# Patient Record
Sex: Female | Born: 1935 | Race: White | Hispanic: No | Smoking: Former smoker
Health system: Southern US, Community
[De-identification: ages and names within clinical notes are randomized; demographics above are authoritative.]

## PROBLEM LIST (undated history)

## (undated) DIAGNOSIS — C50419 Malignant neoplasm of upper-outer quadrant of unspecified female breast: Secondary | ICD-10-CM

## (undated) DIAGNOSIS — M1990 Unspecified osteoarthritis, unspecified site: Secondary | ICD-10-CM

## (undated) DIAGNOSIS — J449 Chronic obstructive pulmonary disease, unspecified: Secondary | ICD-10-CM

## (undated) DIAGNOSIS — R32 Unspecified urinary incontinence: Secondary | ICD-10-CM

## (undated) DIAGNOSIS — R001 Bradycardia, unspecified: Secondary | ICD-10-CM

## (undated) DIAGNOSIS — I482 Chronic atrial fibrillation, unspecified: Secondary | ICD-10-CM

## (undated) DIAGNOSIS — E785 Hyperlipidemia, unspecified: Secondary | ICD-10-CM

## (undated) DIAGNOSIS — M810 Age-related osteoporosis without current pathological fracture: Secondary | ICD-10-CM

## (undated) DIAGNOSIS — G4730 Sleep apnea, unspecified: Secondary | ICD-10-CM

## (undated) DIAGNOSIS — Z923 Personal history of irradiation: Secondary | ICD-10-CM

## (undated) DIAGNOSIS — T7840XA Allergy, unspecified, initial encounter: Secondary | ICD-10-CM

## (undated) DIAGNOSIS — I2510 Atherosclerotic heart disease of native coronary artery without angina pectoris: Secondary | ICD-10-CM

## (undated) DIAGNOSIS — I10 Essential (primary) hypertension: Secondary | ICD-10-CM

## (undated) DIAGNOSIS — E039 Hypothyroidism, unspecified: Secondary | ICD-10-CM

## (undated) DIAGNOSIS — C50919 Malignant neoplasm of unspecified site of unspecified female breast: Secondary | ICD-10-CM

## (undated) DIAGNOSIS — G629 Polyneuropathy, unspecified: Secondary | ICD-10-CM

## (undated) DIAGNOSIS — M722 Plantar fascial fibromatosis: Secondary | ICD-10-CM

## (undated) DIAGNOSIS — F419 Anxiety disorder, unspecified: Secondary | ICD-10-CM

## (undated) HISTORY — DX: Anxiety disorder, unspecified: F41.9

## (undated) HISTORY — DX: Allergy, unspecified, initial encounter: T78.40XA

## (undated) HISTORY — DX: Age-related osteoporosis without current pathological fracture: M81.0

## (undated) HISTORY — DX: Unspecified urinary incontinence: R32

## (undated) HISTORY — DX: Hypothyroidism, unspecified: E03.9

## (undated) HISTORY — PX: SKIN BIOPSY: SHX1

## (undated) HISTORY — DX: Hyperlipidemia, unspecified: E78.5

## (undated) HISTORY — DX: Unspecified osteoarthritis, unspecified site: M19.90

## (undated) HISTORY — DX: Bradycardia, unspecified: R00.1

## (undated) HISTORY — DX: Chronic atrial fibrillation, unspecified: I48.20

## (undated) HISTORY — DX: Malignant neoplasm of upper-outer quadrant of unspecified female breast: C50.419

## (undated) HISTORY — DX: Malignant neoplasm of unspecified site of unspecified female breast: C50.919

## (undated) HISTORY — DX: Atherosclerotic heart disease of native coronary artery without angina pectoris: I25.10

## (undated) HISTORY — DX: Essential (primary) hypertension: I10

## (undated) HISTORY — DX: Plantar fascial fibromatosis: M72.2

## (undated) HISTORY — DX: Polyneuropathy, unspecified: G62.9

## (undated) HISTORY — DX: Personal history of irradiation: Z92.3

---
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
## 1974-05-06 HISTORY — PX: DILATION AND CURETTAGE OF UTERUS: SHX78

## 1990-05-06 HISTORY — PX: HAND SURGERY: SHX662

## 1998-05-06 HISTORY — PX: CORONARY ANGIOPLASTY WITH STENT PLACEMENT: SHX49

## 1998-10-12 ENCOUNTER — Ambulatory Visit (HOSPITAL_COMMUNITY): Admission: RE | Admit: 1998-10-12 | Discharge: 1998-10-13 | Payer: Self-pay | Admitting: Cardiology

## 1998-10-19 ENCOUNTER — Inpatient Hospital Stay (HOSPITAL_COMMUNITY): Admission: AD | Admit: 1998-10-19 | Discharge: 1998-10-20 | Payer: Self-pay | Admitting: Cardiology

## 1998-10-19 ENCOUNTER — Encounter: Payer: Self-pay | Admitting: Cardiology

## 1999-05-21 ENCOUNTER — Ambulatory Visit (HOSPITAL_COMMUNITY): Admission: RE | Admit: 1999-05-21 | Discharge: 1999-05-21 | Payer: Self-pay | Admitting: Cardiology

## 2004-03-27 ENCOUNTER — Other Ambulatory Visit: Admission: RE | Admit: 2004-03-27 | Discharge: 2004-03-27 | Payer: Self-pay | Admitting: Obstetrics and Gynecology

## 2004-07-02 ENCOUNTER — Encounter: Admission: RE | Admit: 2004-07-02 | Discharge: 2004-07-02 | Payer: Self-pay | Admitting: Internal Medicine

## 2005-04-29 ENCOUNTER — Emergency Department (HOSPITAL_COMMUNITY): Admission: EM | Admit: 2005-04-29 | Discharge: 2005-04-29 | Payer: Self-pay | Admitting: Emergency Medicine

## 2006-06-26 ENCOUNTER — Encounter: Admission: RE | Admit: 2006-06-26 | Discharge: 2006-06-26 | Payer: Self-pay | Admitting: Internal Medicine

## 2006-12-30 ENCOUNTER — Emergency Department (HOSPITAL_COMMUNITY): Admission: EM | Admit: 2006-12-30 | Discharge: 2006-12-30 | Payer: Self-pay | Admitting: Emergency Medicine

## 2007-05-01 ENCOUNTER — Encounter: Admission: RE | Admit: 2007-05-01 | Discharge: 2007-05-01 | Payer: Self-pay | Admitting: Internal Medicine

## 2010-09-21 NOTE — Cardiovascular Report (Signed)
[place]. [facility]  Patient:    [name]                           MRN: [id number] Proc. Date: 05/21/99 Adm. Date:  [id number] Attending:  Corliss Marcus CC:         Winn Jock. Earl Gala, M.D.             Cardiac Catheterization Laboratory                        Cardiac Catheterization  CINE NUMBER:  [id number]  REFERRING PHYSICIAN:  Dr. Fayrene Fearing C. Osborne.  PROCEDURE PERFORMED: 1. Left heart catheterization. 2. Coronary angiography. 3. Left ventriculogram.  INDICATIONS:  [name] is a 75 year old woman, who is now seven months status ost PTCA and stent implantation of the right coronary artery.  She had been doing reasonably well recently, but a screening exercise Cardiolite obtained to rule ut the possibly of silent restenosis indicated a mild inferior reversible defect. She is therefore brought to the catheterization laboratory to evaluate for possible  asymptomatic restenosis.  DESCRIPTION OF PROCEDURE:  Left heart catheterization was performed following the percutaneous insertion of a 5 French catheter sheath utilizing an anterior approach over a guiding J wire into the right femoral artery.  The right groin had previously been prepared and draped in the usual sterile fashion.  Local anesthesia was obtained with the infiltration of 0.1% lidocaine.  A 5 French 110 cm pigtail catheter was advanced to the ascending aorta and then to the left ventricle where the pressure was recorded both prior to and following the ventriculogram.  A 30  degree RAO cine left ventriculogram was performed using a power injector.  Then  45 cc of nonionic contrast material was injected at 13 cc/sec.  Coronary angiography was then performed using 5 French #4 left and right Judkins catheters. Cineangiography of each coronary artery was conducted in multiple LAO and RAO projections.  All catheter manipulations were performed using fluoroscopic observation and  exchanged over a long guiding J wire.  The patient did receive 500 units of heparin intravenously following the insertion of the arterial sheath and 0.4 mg of sublingual nitroglycerin prior to initiation of the coronary angiography.  At the completion of the procedure, the catheter and catheter sheath was removed. Hemostasis was achieved by direct pressure.  The patient was transported back to the day catheterization center in stable condition with intact distal pulses. FLUOROSCOPY TIME:  2.8 minutes.  TOTAL CONTRAST UTILIZED:  Isovue 150 cc.  HEMODYNAMICS:  Systemic arterial pressure was mildly elevated at 176/92 with a ean of 125 mmHg.  There was no systolic gradient across the aortic valve.  The left  ventricular end-diastolic pressure was 30 mmHg preventriculogram and 33 mmHg post ventriculogram.  ANGIOGRAPHY:  LEFT VENTRICULOGRAM:  The left ventriculogram demonstrated normal left ventricular chamber size and overall normal global systolic function.  There was mild inferior wall hypokinesia intact left ventricular size and global systolic function. There was a anterolateral hypokinesis.  The calculated ejection fraction utilizing a single plane cine method was 61%.  There was diastolic mitral regurgitation seen, none during systole.  There was no coronary calcification.  CORONARY ANGIOGRAPHY:  There was a right dominant coronary system present.  The main left coronary artery was normal.  The left anterior descending:  The left anterior descending artery and its branches were minimal diseased.  There were  luminal irregularities throughout the course of the left anterior descending which was large and transapical.  The large diagonal branch showed a 50% stenosis in the midportion.  The body of the left anterior descending itself had no significant stenosis but was diffusely diseased minimally.  Left circumflex:  The left circumflex artery and its branches were  minimally diseased; again, there were luminal irregularities throughout the course of this vessel not greater than 20-25%.  Four moderate sized marginal branches arise feeding the lateral wall of the heart and were without significant stenosis.  Right coronary artery:  The right coronary artery and its branches was minimally diseased.  The proximal portion is the location of the previously placed stent.  There was about a 20-25% stenosis through the stent segment.  It was otherwise widely patent.  The ongoing vessel was large and again had luminal irregularities, perhaps a 30% obstruction of the lumen.  There was a moderate sized posterior descending artery and two large posterior descending branches which were seen distally and without significant obstruction.  Collateral vessels were not seen.  FINAL DIAGNOSES: 1. Atherosclerotic cardiovascular disease, single-vessel. 2. Widely patent right coronary artery stent site.  No evidence of restenosis. 3. Moderately elevated left ventricular end-diastolic pressure. 4. Moderate systemic hypertension. 5. Overall normal global systolic function.  PLAN:  The patient is informed of this gratifying news.  No further coronary intervention is anticipated at this time.  The patient will be discharged to home on the usual outpatient medicines with followup for hypertensive control. DD: 05/21/99 TD:  05/21/99 Job: [id number]

## 2010-12-25 ENCOUNTER — Other Ambulatory Visit: Payer: Self-pay

## 2010-12-26 ENCOUNTER — Other Ambulatory Visit: Payer: Self-pay | Admitting: Radiology

## 2010-12-26 DIAGNOSIS — C50912 Malignant neoplasm of unspecified site of left female breast: Secondary | ICD-10-CM

## 2010-12-26 DIAGNOSIS — C50419 Malignant neoplasm of upper-outer quadrant of unspecified female breast: Secondary | ICD-10-CM

## 2010-12-26 DIAGNOSIS — Z170 Estrogen receptor positive status [ER+]: Secondary | ICD-10-CM

## 2010-12-26 DIAGNOSIS — C50412 Malignant neoplasm of upper-outer quadrant of left female breast: Secondary | ICD-10-CM | POA: Insufficient documentation

## 2010-12-26 DIAGNOSIS — C50919 Malignant neoplasm of unspecified site of unspecified female breast: Secondary | ICD-10-CM

## 2010-12-26 HISTORY — DX: Malignant neoplasm of upper-outer quadrant of unspecified female breast: C50.419

## 2010-12-26 HISTORY — DX: Malignant neoplasm of unspecified site of unspecified female breast: C50.919

## 2010-12-28 ENCOUNTER — Ambulatory Visit
Admission: RE | Admit: 2010-12-28 | Discharge: 2010-12-28 | Disposition: A | Discharge status: Home / Self Care | Payer: Medicare Other | Source: Ambulatory Visit | Attending: Radiology | Admitting: Radiology

## 2010-12-28 DIAGNOSIS — C50912 Malignant neoplasm of unspecified site of left female breast: Secondary | ICD-10-CM

## 2010-12-28 MED ORDER — GADOBENATE DIMEGLUMINE 529 MG/ML IV SOLN
20.0000 mL | Freq: Once | INTRAVENOUS | Status: AC | PRN
  Administered 2010-12-28: 20 mL via INTRAVENOUS

## 2011-01-01 ENCOUNTER — Ambulatory Visit (HOSPITAL_COMMUNITY)
Admission: RE | Admit: 2011-01-01 | Discharge: 2011-01-01 | Disposition: A | Discharge status: Home / Self Care | Payer: Medicare Other | Source: Ambulatory Visit | Attending: Internal Medicine | Admitting: Internal Medicine

## 2011-01-01 DIAGNOSIS — J45909 Unspecified asthma, uncomplicated: Secondary | ICD-10-CM | POA: Insufficient documentation

## 2011-01-02 ENCOUNTER — Encounter (INDEPENDENT_AMBULATORY_CARE_PROVIDER_SITE_OTHER): Payer: Self-pay | Admitting: Surgery

## 2011-01-02 ENCOUNTER — Ambulatory Visit (HOSPITAL_BASED_OUTPATIENT_CLINIC_OR_DEPARTMENT_OTHER): Payer: Medicare Other | Admitting: Surgery

## 2011-01-02 ENCOUNTER — Other Ambulatory Visit: Payer: Self-pay | Admitting: Oncology

## 2011-01-02 ENCOUNTER — Telehealth (INDEPENDENT_AMBULATORY_CARE_PROVIDER_SITE_OTHER): Payer: Self-pay | Admitting: General Surgery

## 2011-01-02 ENCOUNTER — Ambulatory Visit (INDEPENDENT_AMBULATORY_CARE_PROVIDER_SITE_OTHER): Payer: Medicare Other | Admitting: Surgery

## 2011-01-02 ENCOUNTER — Encounter (HOSPITAL_BASED_OUTPATIENT_CLINIC_OR_DEPARTMENT_OTHER): Payer: Medicare Other | Admitting: Oncology

## 2011-01-02 ENCOUNTER — Ambulatory Visit (INDEPENDENT_AMBULATORY_CARE_PROVIDER_SITE_OTHER): Payer: Self-pay | Admitting: Surgery

## 2011-01-02 VITALS — BP 105/70 | HR 79 | Temp 97.9°F | Resp 20 | Ht 66.0 in | Wt 345.0 lb

## 2011-01-02 DIAGNOSIS — C50919 Malignant neoplasm of unspecified site of unspecified female breast: Secondary | ICD-10-CM

## 2011-01-02 DIAGNOSIS — Z01810 Encounter for preprocedural cardiovascular examination: Secondary | ICD-10-CM

## 2011-01-02 DIAGNOSIS — C50419 Malignant neoplasm of upper-outer quadrant of unspecified female breast: Secondary | ICD-10-CM

## 2011-01-02 LAB — COMPREHENSIVE METABOLIC PANEL
AST: 23 U/L (ref 0–37)
Albumin: 3.4 g/dL — ABNORMAL LOW (ref 3.5–5.2)
BUN: 21 mg/dL (ref 6–23)
Calcium: 9.9 mg/dL (ref 8.4–10.5)
Chloride: 101 mEq/L (ref 96–112)
Creatinine, Ser: 0.96 mg/dL (ref 0.50–1.10)
Glucose, Bld: 173 mg/dL — ABNORMAL HIGH (ref 70–99)
Potassium: 3 mEq/L — ABNORMAL LOW (ref 3.5–5.3)

## 2011-01-02 LAB — CBC WITH DIFFERENTIAL/PLATELET
Basophils Absolute: 0 10*3/uL (ref 0.0–0.1)
EOS%: 1.2 % (ref 0.0–7.0)
Eosinophils Absolute: 0.1 10*3/uL (ref 0.0–0.5)
HCT: 44.3 % (ref 34.8–46.6)
HGB: 15.2 g/dL (ref 11.6–15.9)
MCH: 32.5 pg (ref 25.1–34.0)
MCV: 94.7 fL (ref 79.5–101.0)
MONO%: 9.2 % (ref 0.0–14.0)
NEUT#: 4.7 10*3/uL (ref 1.5–6.5)
NEUT%: 64.1 % (ref 38.4–76.8)
lymph#: 1.9 10*3/uL (ref 0.9–3.3)

## 2011-01-02 NOTE — Telephone Encounter (Signed)
Patient needs cardiology referral prior to surgery. Patient is seen at [facility].

## 2011-01-02 NOTE — Progress Notes (Signed)
Chief Complaint  Patient presents with  . Breast Cancer    HPI [name] is a 75 y.o. female.  This patient is seen in the breast multidisciplinary conference today for a recent diagnosis of an invasive lobular carcinoma in the upper outer quadrant of the left breast. This was diagnosed on mammogram and a core biopsy confirmed invasive lobular carcinoma with some associated DCIS and some lobular hyperplasia. Patient has been completely asymptomatic. She does know she got a bruise from the biopsy. She's never had any other prior significant breast problems. HPI  Past Medical History  Diagnosis Date  . Anxiety   . Allergy   . Asthma   . 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 Incontinence of urine   . Plantar fasciitis     Past Surgical History  Procedure Date  . Tonsillectomy 1955  . Cholecystectomy 1955  . Hand surgery 1992    tendons between thumb and forefinger  . Coronary angioplasty with stent placement 2000  . Skin biopsy     1994, 2006, 2008, 2010, 2011, 2011, 2012-  pre-cancerous  . Dilation and curettage of uterus 1976    and 1996  . Breast surgery     Family History  Problem Relation Age of Onset  . Heart disease Father   . Asthma Sister   . Heart disease Brother     Social History History  Substance Use Topics  . Smoking status: Former Smoker -- 1.0 packs/day    Quit date: 12/05/1962  . Smokeless tobacco: Not on file  . Alcohol Use: No    Allergies  Allergen Reactions  . Lidocaine     Became shakey  . Lipitor (Atorvastatin Calcium)     Elevated liver function   . Sulfa Antibiotics Nausea Only    Also complained of dizziness  . Vicodin (Hydrocodone-Acetaminophen)     Itching all over  . Penicillins Rash    Pt reported all over body rash in 1958    Current Outpatient Prescriptions  Medication Sig Dispense Refill  . albuterol (PROVENTIL HFA;VENTOLIN HFA) 108 (90 BASE) MCG/ACT inhaler Inhale 2 puffs into the lungs as needed.        Marland Kitchen  alendronate (FOSAMAX) 70 MG tablet Take 70 mg by mouth every 7 (seven) days. Take with a full glass of water on an empty stomach.       . ALPRAZolam (XANAX) 0.5 MG tablet Take 0.5 mg by mouth 2 (two) times daily as needed.        Marland Kitchen aspirin 325 MG tablet Take 325 mg by mouth daily.        Marland Kitchen atenolol (TENORMIN) 50 MG tablet Take 50 mg by mouth daily.        . fexofenadine (ALLEGRA) 180 MG tablet Take 180 mg by mouth daily.        . fish oil-omega-3 fatty acids 1000 MG capsule Take 2 g by mouth 4 (four) times daily.        Marland Kitchen FLUoxetine (PROZAC) 20 MG capsule Take 20 mg by mouth daily.        . fluticasone (VERAMYST) 27.5 MCG/SPRAY nasal spray Place 2 sprays into the nose daily.        . Fluticasone-Salmeterol (ADVAIR) 500-50 MCG/DOSE AEPB Inhale 1 puff into the lungs every 12 (twelve) hours.        . hydrochlorothiazide 25 MG tablet Take 50 mg by mouth once.        Marland Kitchen  levothyroxine (SYNTHROID, LEVOTHROID) 50 MCG tablet Take 50 mcg by mouth daily.        . montelukast (SINGULAIR) 10 MG tablet Take 10 mg by mouth at bedtime.        . Multiple Vitamin (MULTIVITAMIN) tablet Take 1 tablet by mouth daily.        . potassium chloride (KLOR-CON) 20 MEQ packet Take 20 mEq by mouth 1 day or 1 dose.        . traMADol (ULTRAM) 50 MG tablet Take 50 mg by mouth every 6 (six) hours as needed.          Review of Systems ROSI have reviewed her past medical history and review of systems most of which are noted in the [facility] chart. Therefore they are not redictated in this note.  Blood pressure 105/70, pulse 79, temperature 97.9 F (36.6 C), resp. rate 20, height 5\' 6"  (1.676 m), weight 345 lb (156.491 kg).  Physical Exam GENERAL: The patient is alert, oriented, and generally healthy-appearing, NAD. She is very obese. She has need of a walk Mood and affect are normal.  HEENT: The head is normocephalic, the eyes nonicteric, the pupils were round regular and equal. EOMs are normal. Pharynx normal. Dentition  good.  NECK: The neck is supple and there are no masses or thyromegaly.  LUNGS: Normal respirations and clear to auscultation.  HEART: Regular rhythm, with no murmurs rubs or gallops. Pulses are intact carotid dorsalis pedis and posterior tibial. No significant varicosities are noted.  BREASTS: the breasts are symmetric in size and shape. They are fairly large. There is no definite palpable mass on either side. There is a little bit of tenderness around the biopsy site.  LYMPHATICS: There is no axillary or supraclavicular adenopathy on either side.  ABDOMEN: Soft, flat, and nontender. No masses or organomegaly is noted. No hernias are noted. Bowel sounds are normal.  EXTREMITIES: Good range of motion, there is some brawny edema of both lower remedies  Physical Exam   Data Reviewed I have reviewed her mammogram films, the reports, the MRI reports and the significant findings on the scans. I've seen the pathology report and slides. I reviewed her detail past medical history by her primary physicians.  Assessment    Clinical stage I receptor positive invasive lobular carcinoma upper outer quadrant left breast    Plan    I think she is a good candidate for a needle guided lumpectomy and sentinel node evaluation. She has multiple medical comorbidities. She'll need a cardiac clearance. She will likely need radiation therapy after surgery. If her sentinel nodes are involved she may need additional radiation, but after discussion with medical and radiation oncologists, full no dissection would not be indicated here.  I discussed the above findings with the patient and her family in detail. We have reviewed the indications risks and complications of the surgery, the need for long-term followup, and the need for radiation and probably antiestrogen therapy after surgery. She is aware she has increased risk because of her medical issues. She does we are trying to arrange a cardiac clearance  preoperatively.  Thank all questions have been answered and she would like to get scheduled as soon as possible. We will try to arrange that as soon as her clearance is done.       Eugean Arnott J 01/02/2011, 11:25 AM

## 2011-01-10 ENCOUNTER — Telehealth (INDEPENDENT_AMBULATORY_CARE_PROVIDER_SITE_OTHER): Payer: Self-pay | Admitting: General Surgery

## 2011-01-10 NOTE — Telephone Encounter (Signed)
Patient needs an RX for a camisole faxed to [facility]. Fax # [phone].

## 2011-01-11 ENCOUNTER — Telehealth (INDEPENDENT_AMBULATORY_CARE_PROVIDER_SITE_OTHER): Payer: Self-pay | Admitting: General Surgery

## 2011-01-11 NOTE — Telephone Encounter (Signed)
I spoke with patient making her aware a prescription for a camisole was faxed this am to [facility]. She expressed appreciation.

## 2011-01-17 ENCOUNTER — Telehealth (INDEPENDENT_AMBULATORY_CARE_PROVIDER_SITE_OTHER): Payer: Self-pay | Admitting: Surgery

## 2011-01-17 NOTE — Telephone Encounter (Signed)
Patient states someone cancelled her appt by the name "Lusk" with [facility]. She spoke with the nurse over there who is speaking with the doctor about fitting her back in. I informed the patient that I did not cancel this appt and if she needs in sooner she could always ask to speak with a manager at [facility] and let them know what happened.

## 2011-01-21 DIAGNOSIS — I482 Chronic atrial fibrillation, unspecified: Secondary | ICD-10-CM | POA: Insufficient documentation

## 2011-01-21 DIAGNOSIS — I4891 Unspecified atrial fibrillation: Secondary | ICD-10-CM | POA: Insufficient documentation

## 2011-01-25 ENCOUNTER — Telehealth (INDEPENDENT_AMBULATORY_CARE_PROVIDER_SITE_OTHER): Payer: Self-pay | Admitting: Surgery

## 2011-01-25 ENCOUNTER — Other Ambulatory Visit (INDEPENDENT_AMBULATORY_CARE_PROVIDER_SITE_OTHER): Payer: Self-pay | Admitting: Surgery

## 2011-01-25 DIAGNOSIS — C50912 Malignant neoplasm of unspecified site of left female breast: Secondary | ICD-10-CM

## 2011-01-25 NOTE — Telephone Encounter (Signed)
Spoke with patient re: her preop blood thinning medication. Patient takes Xarelto 20 mg QD per Dr Eldridge Dace. Patient was told by him to stop this 2 days prior to surgery. This medication thins your plasma not your platelets. I spoke with Dr Jamey Ripa and he advised to go with what Dr Eldridge Dace recommended since he was not familiar with this medicaiton. Patient made aware. She was also asking about her medications the day of surgery. I advised she should speak with the nurse from the hospital when they call about her preop appt. She will call with any other questions prior to surgery.

## 2011-01-25 NOTE — Telephone Encounter (Signed)
01/25/11 Please call pt [name] she is having surgery 9/25 she has some questions in regards to her new medication Dr Jamey Ripa gave here.  [name]

## 2011-01-28 ENCOUNTER — Other Ambulatory Visit (INDEPENDENT_AMBULATORY_CARE_PROVIDER_SITE_OTHER): Payer: Self-pay | Admitting: Surgery

## 2011-01-28 ENCOUNTER — Ambulatory Visit
Admission: RE | Admit: 2011-01-28 | Discharge: 2011-01-28 | Disposition: A | Discharge status: Home / Self Care | Payer: Medicare Other | Source: Ambulatory Visit | Attending: Surgery | Admitting: Surgery

## 2011-01-28 ENCOUNTER — Encounter (HOSPITAL_BASED_OUTPATIENT_CLINIC_OR_DEPARTMENT_OTHER)
Admission: RE | Admit: 2011-01-28 | Discharge: 2011-01-28 | Disposition: A | Discharge status: Home / Self Care | Payer: Medicare Other | Source: Ambulatory Visit | Attending: Surgery | Admitting: Surgery

## 2011-01-28 ENCOUNTER — Telehealth (INDEPENDENT_AMBULATORY_CARE_PROVIDER_SITE_OTHER): Payer: Self-pay | Admitting: General Surgery

## 2011-01-28 DIAGNOSIS — Z01811 Encounter for preprocedural respiratory examination: Secondary | ICD-10-CM

## 2011-01-28 LAB — DIFFERENTIAL
Basophils Absolute: 0 10*3/uL (ref 0.0–0.1)
Basophils Relative: 0 % (ref 0–1)
Lymphocytes Relative: 18 % (ref 12–46)
Monocytes Absolute: 0.9 10*3/uL (ref 0.1–1.0)
Neutro Abs: 6.1 10*3/uL (ref 1.7–7.7)
Neutrophils Relative %: 69 % (ref 43–77)

## 2011-01-28 LAB — CBC
HCT: 42.5 % (ref 36.0–46.0)
Hemoglobin: 14.4 g/dL (ref 12.0–15.0)
RBC: 4.43 MIL/uL (ref 3.87–5.11)
WBC: 8.9 10*3/uL (ref 4.0–10.5)

## 2011-01-28 LAB — BASIC METABOLIC PANEL
BUN: 15 mg/dL (ref 6–23)
CO2: 30 mEq/L (ref 19–32)
Chloride: 102 mEq/L (ref 96–112)
GFR calc non Af Amer: >60 mL/min (ref >60)
Glucose, Bld: 119 mg/dL — ABNORMAL HIGH (ref 70–99)
Potassium: 3.7 mEq/L (ref 3.5–5.1)
Sodium: 141 mEq/L (ref 135–145)

## 2011-01-28 NOTE — Telephone Encounter (Signed)
Labs okay for surgery faxed to pre-op.  

## 2011-01-28 NOTE — Telephone Encounter (Signed)
Message copied by Liliana Cline on Mon Jan 28, 2011  4:49 PM ------      Message from: [name]      Created: Mon Jan 28, 2011  3:48 PM       These labs are OK for surgery

## 2011-01-29 ENCOUNTER — Encounter (INDEPENDENT_AMBULATORY_CARE_PROVIDER_SITE_OTHER): Payer: Self-pay | Admitting: Surgery

## 2011-01-29 ENCOUNTER — Ambulatory Visit (HOSPITAL_BASED_OUTPATIENT_CLINIC_OR_DEPARTMENT_OTHER)
Admission: RE | Admit: 2011-01-29 | Discharge: 2011-01-29 | Disposition: A | Discharge status: Home / Self Care | Payer: Medicare Other | Source: Ambulatory Visit | Attending: Surgery | Admitting: Surgery

## 2011-01-29 ENCOUNTER — Ambulatory Visit (HOSPITAL_COMMUNITY)
Admission: RE | Admit: 2011-01-29 | Discharge: 2011-01-29 | Disposition: A | Discharge status: Home / Self Care | Payer: Medicare Other | Source: Ambulatory Visit | Attending: Surgery | Admitting: Surgery

## 2011-01-29 ENCOUNTER — Other Ambulatory Visit (INDEPENDENT_AMBULATORY_CARE_PROVIDER_SITE_OTHER): Payer: Self-pay | Admitting: Surgery

## 2011-01-29 DIAGNOSIS — I10 Essential (primary) hypertension: Secondary | ICD-10-CM | POA: Insufficient documentation

## 2011-01-29 DIAGNOSIS — Z01818 Encounter for other preprocedural examination: Secondary | ICD-10-CM | POA: Insufficient documentation

## 2011-01-29 DIAGNOSIS — Z01812 Encounter for preprocedural laboratory examination: Secondary | ICD-10-CM | POA: Insufficient documentation

## 2011-01-29 DIAGNOSIS — C50919 Malignant neoplasm of unspecified site of unspecified female breast: Secondary | ICD-10-CM | POA: Insufficient documentation

## 2011-01-29 DIAGNOSIS — I2510 Atherosclerotic heart disease of native coronary artery without angina pectoris: Secondary | ICD-10-CM | POA: Insufficient documentation

## 2011-01-29 DIAGNOSIS — C50419 Malignant neoplasm of upper-outer quadrant of unspecified female breast: Secondary | ICD-10-CM | POA: Insufficient documentation

## 2011-01-29 DIAGNOSIS — C50912 Malignant neoplasm of unspecified site of left female breast: Secondary | ICD-10-CM

## 2011-01-29 DIAGNOSIS — E669 Obesity, unspecified: Secondary | ICD-10-CM | POA: Insufficient documentation

## 2011-01-29 HISTORY — PX: BREAST LUMPECTOMY W/ NEEDLE LOCALIZATION: SHX1266

## 2011-01-29 MED ORDER — TECHNETIUM TC 99M SULFUR COLLOID FILTERED
1.0000 | Freq: Once | INTRAVENOUS | Status: AC | PRN
  Administered 2011-01-29: 1 via INTRADERMAL

## 2011-01-30 ENCOUNTER — Telehealth (INDEPENDENT_AMBULATORY_CARE_PROVIDER_SITE_OTHER): Payer: Self-pay | Admitting: General Surgery

## 2011-01-30 NOTE — Telephone Encounter (Signed)
Message copied by Liliana Cline on Wed Jan 30, 2011 11:11 AM ------      Message from: Milas Hock      Created: Wed Jan 30, 2011 11:01 AM       Pt needs 2 week post op for lumpectomy.  Pls schedule and call her at [phone].  Thanks.

## 2011-01-30 NOTE — Telephone Encounter (Signed)
Dr Jamey Ripa is DOW at the two week mark. I called patient and made appt for 02/20/11. She is to call if has any issues and we will make appt for her to see someone sooner. Patient is doing very well.

## 2011-01-31 NOTE — Op Note (Addendum)
[name], [name]                   ACCOUNT NO.:  [id number]  MEDICAL RECORD NO.:  [id number]  LOCATION:  [facility]                          FACILITY:  [facility]  PHYSICIAN:  [name], M.D.DATE OF BIRTH:  02-11-1936  DATE OF PROCEDURE:  01/29/2011 DATE OF DISCHARGE:                              OPERATIVE REPORT   PREOPERATIVE DIAGNOSIS:  Carcinoma, left breast, upper outer quadrant, clinical stage I.  POSTOPERATIVE DIAGNOSIS:  Carcinoma, left breast, upper outer quadrant, clinical stage I.  PROCEDURE:  Needle-guided lumpectomy with blue dye injection and sentinel lymph node biopsy (2 nodes)  SURGEON:  [name], MD  ANESTHESIA:  General.  CLINICAL HISTORY:  This is a 75 year old lady who recently found to have a small left breast cancer in the upper outer quadrant.  After discussion with the patient and consultation with Medical and Radiation Oncology, was elected to proceed to a lumpectomy with nodal evaluation.  DESCRIPTION OF PROCEDURE:  I saw the patient in the holding area and she had no further questions.  We confirmed the left breast as the operative side and I marked that.  I reviewed the guidewire localizing films.  The patient was taken to the operating room and after satisfactory general anesthesia had been obtained, the time-out was done.  I then injected 5 mL of dilute methylene blue and massaged that in subareolarly.  A full prep and drape was then done.  The guidewire entered laterally and tracked medially.  The breasts were fairly large, so I have retracted medially and then made a transverse incision directly over the guidewire site.  I raised some skin thin flap superiorly and inferiorly to the guidewire onto the breast tissue for some traction and then took a wide block of tissue around the guidewire until I was beyond the tip.  This tracked directly towards the chest wall.  I appeared to be well around it and inked the specimen.   Specimen mammogram showed the clip in the specimen.  I thought it was closest to the inferior and medial borders, so I took another centimeter thick piece of the inferior and medial border.  I did not think I could get any further deep margin.  This was likewise labeled for orientation.  I made sure everything was dry by irrigating.  I suture ligated some vessels and cauterized others.  I clipped the margins with baby clips. I then closed in layers with 3-0 Vicryl, 4-0 Monocryl, subcuticular and Dermabond.  Attention was then turned to the axilla and hot area identified. Transverse incision made and self-retaining retractor placed.  I divided some of the subcutaneous tissues, entered the axilla.  Using Neoprobe as a guide, I found an area that was very hot, dissected in there and found a blue lymph node.  I grasped that with a Babcock and started to excise it and found a second adjacent node which also had high counts with the Neoprobe and did not appear to have blue dye.  These were both removed and separated.  The first node had counts of about 1000 and second about 300.  The first node had blue dye, the second did not.  Both were sent to pathology.  With those out, there is no palpable adenopathy, no other blue lymph nodes that I could identifier or blue lymphatics and no other areas had counts about 0-5.  I put some additional Marcaine in here and again closed in layers with 3- 0 Vicryl, 4-0 Monocryl, subcuticular plus Dermabond.  The patient tolerated the procedure well and there were no complications.  All counts were correct.     [name], M.D.     CJS/MEDQ  D:  01/29/2011  T:  01/29/2011  Job:  [id number]  cc:   Lowella Dell, M.D. Lurline Hare, M.D. [name], M.D.  Electronically Signed by Cyndia Bent M.D. on 02/13/2011 07:13:24 PM

## 2011-02-01 ENCOUNTER — Telehealth (INDEPENDENT_AMBULATORY_CARE_PROVIDER_SITE_OTHER): Payer: Self-pay | Admitting: General Surgery

## 2011-02-01 NOTE — Telephone Encounter (Signed)
Patient called for pathology results. I informed her that her margins are negative and lymph nodes are negative. She will follow up at her scheduled appt and call if has any issues prior.

## 2011-02-05 ENCOUNTER — Telehealth (INDEPENDENT_AMBULATORY_CARE_PROVIDER_SITE_OTHER): Payer: Self-pay | Admitting: General Surgery

## 2011-02-05 NOTE — Telephone Encounter (Signed)
Pt called stating she has been issued Prednisone 5 mg dose pack for 6 days by Dr. Marcene Corning (Orthopedic) due to torn tendon's in foot. She was told to confirm if it was ok to have it filled due to having sx last week by Dr. Jamey Ripa. Pt advised she was also seeing another doctor today that she has to obtain approval from also. Advised pt Dr. Jamey Ripa was on vacation, information would be forwarded to his nurse and call would be returned to advise if safe to take medication her Ortho prescribed.

## 2011-02-13 ENCOUNTER — Encounter: Payer: Self-pay | Admitting: Surgery

## 2011-02-20 ENCOUNTER — Encounter (INDEPENDENT_AMBULATORY_CARE_PROVIDER_SITE_OTHER): Payer: Self-pay | Admitting: Surgery

## 2011-02-20 ENCOUNTER — Ambulatory Visit (INDEPENDENT_AMBULATORY_CARE_PROVIDER_SITE_OTHER): Payer: Medicare Other | Admitting: Surgery

## 2011-02-20 VITALS — BP 134/86 | HR 60 | Temp 96.9°F | Resp 20 | Ht 67.0 in | Wt 347.0 lb

## 2011-02-20 DIAGNOSIS — C50919 Malignant neoplasm of unspecified site of unspecified female breast: Secondary | ICD-10-CM

## 2011-02-20 NOTE — Patient Instructions (Signed)
You need to see the radiation oncologist to get started on any radiation treatments. I will let them know you are over surgery. See me about 3 or 4 weeks after you have finished radiation

## 2011-02-20 NOTE — Progress Notes (Signed)
[name]    [id number] 02/20/2011    10-08-1935   CC: Post op lumpectomy  HPI: The patient returns for post op follow-up. She underwent a left lumpectomy and SLN on 01/29/2011. Over all she feels that she is doing well. She has had mild incisional pain at the lumpectomy site  PE: The incision is healing nicely and there is no evidence of infection or hematoma.There is mild breast erythema   DATA REVIEWED: Pathology report showed ILC grade II  IMPRESSION: Patient doing well. Needs to see radiation therapist  PLAN: Her next visit will be in about three months.

## 2011-03-07 ENCOUNTER — Ambulatory Visit
Admission: RE | Admit: 2011-03-07 | Discharge: 2011-03-07 | Disposition: A | Discharge status: Home / Self Care | Payer: Medicare Other | Source: Ambulatory Visit | Attending: Radiation Oncology | Admitting: Radiation Oncology

## 2011-03-07 DIAGNOSIS — M109 Gout, unspecified: Secondary | ICD-10-CM | POA: Insufficient documentation

## 2011-03-07 DIAGNOSIS — C50919 Malignant neoplasm of unspecified site of unspecified female breast: Secondary | ICD-10-CM | POA: Insufficient documentation

## 2011-03-07 DIAGNOSIS — E669 Obesity, unspecified: Secondary | ICD-10-CM | POA: Insufficient documentation

## 2011-03-07 DIAGNOSIS — Z510 Encounter for antineoplastic radiation therapy: Secondary | ICD-10-CM | POA: Insufficient documentation

## 2011-03-08 ENCOUNTER — Telehealth: Payer: Self-pay | Admitting: Oncology

## 2011-03-08 ENCOUNTER — Encounter: Payer: Self-pay | Admitting: *Deleted

## 2011-03-11 ENCOUNTER — Ambulatory Visit (HOSPITAL_BASED_OUTPATIENT_CLINIC_OR_DEPARTMENT_OTHER): Payer: Medicare Other | Admitting: Physician Assistant

## 2011-03-11 ENCOUNTER — Other Ambulatory Visit (HOSPITAL_BASED_OUTPATIENT_CLINIC_OR_DEPARTMENT_OTHER): Payer: Medicare Other

## 2011-03-11 ENCOUNTER — Other Ambulatory Visit: Payer: Self-pay | Admitting: Oncology

## 2011-03-11 VITALS — BP 108/72 | HR 84 | Temp 98.0°F | Ht 66.0 in | Wt 343.5 lb

## 2011-03-11 DIAGNOSIS — M109 Gout, unspecified: Secondary | ICD-10-CM

## 2011-03-11 DIAGNOSIS — C50919 Malignant neoplasm of unspecified site of unspecified female breast: Secondary | ICD-10-CM

## 2011-03-11 DIAGNOSIS — Z170 Estrogen receptor positive status [ER+]: Secondary | ICD-10-CM

## 2011-03-11 DIAGNOSIS — C50419 Malignant neoplasm of upper-outer quadrant of unspecified female breast: Secondary | ICD-10-CM

## 2011-03-11 LAB — CBC WITH DIFFERENTIAL/PLATELET
EOS%: 2.3 % (ref 0.0–7.0)
Eosinophils Absolute: 0.2 10*3/uL (ref 0.0–0.5)
MCH: 31.2 pg (ref 25.1–34.0)
MCV: 94.9 fL (ref 79.5–101.0)
MONO%: 7.8 % (ref 0.0–14.0)
NEUT#: 4.7 10*3/uL (ref 1.5–6.5)
RBC: 4.55 10*6/uL (ref 3.70–5.45)
RDW: 14.6 % — ABNORMAL HIGH (ref 11.2–14.5)
nRBC: 0 % (ref 0–0)

## 2011-03-11 NOTE — Progress Notes (Signed)
Hematology and Oncology Follow Up Visit  [name] [id number] 27-Sep-1935 75 y.o. 03/11/2011 2:10 PM   Interim History:  Patient returns today for followup of her left breast carcinoma. Patient is status post recent left lumpectomy under the care of Dr. Jamey Ripa on September 25. She had no complications, and tolerated the surgery well. She is healed well, with only some mild tenderness in the breast. She is scheduled to begin radiation therapy next week under the care of Dr. Michell Heinrich.  Overall the patient is feeling well. 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 Jerl Santos. She has completed 3 courses of prednisone, and is also status post recent cortisone injection given by Dr. Jerl Santos last week. The joint pain associated with the gout is slowly improving, but has not yet resolved.  Of note the patient has had gout in the past which was treated with Indocin. Currently, however, she is unable to take that medication due to the Xarelto she is taking for a fib.  Otherwise the patient has no new complaints today. We did spend over half of our 30 minute appointment today answering her questions indwelling over her treatment plan.  Review of Systems: Constitutional:  no weight loss, fever, night sweats Eyes: negative [id number] Cardiovascular: no chest pain or dyspnea on exertion Respiratory: no cough, shortness of breath, or wheezing Neurological: negative Dermatological: negative Gastrointestinal: no abdominal pain, change in bowel habits, or black or bloody stools Genito-Urinary: no dysuria, trouble voiding, or hematuria Hematological and Lymphatic: negative Breast: negative Musculoskeletal: positive for - joint pain Remaining ROS negative.  Medications: I have reviewed the patient's current medications.  Allergies:  Allergies  Allergen Reactions  . Lipitor (Atorvastatin Calcium)     Elevated liver function   .  Sulfa Antibiotics Nausea Only    Also complained of dizziness  . Vicodin (Hydrocodone-Acetaminophen)     Itching all over  . Penicillins Rash    Pt reported all over body rash in 1958     Physical Exam: Blood pressure 108/72, pulse 84, temperature 98 F (36.7 C), temperature source Oral, height 5\' 6"  (1.676 m), weight 343 lb 8 oz (155.811 kg). HEENT:  Sclerae anicteric, conjunctivae pink.  Oropharynx clear.  No mucositis or candidiasis.  Nodes:  No cervical, supraclavicular, or axillary lymphadenopathy palpated.  Breast Exam:  Right breast is benign.  No masses, discharge, skin change, or nipple inversion.  Left breast is benign.  No masses, discharge, skin change, or nipple inversion..  Lungs:  Clear to auscultation bilaterally.  No crackles, rhonchi, or wheezes.  Heart:  Regular rate and rhythm.  Abdomen:  Soft, nontender.  Positive bowel sounds.  No organomegaly or masses palpated.  Musculoskeletal:  No focal spinal tenderness to palpation.  Extremities:  Benign.  No peripheral edema or cyanosis.  Skin:  Benign.  Neuro:  Nonfocal.   Lab Results: Lab Results  Component Value Date   WBC 8.9 01/28/2011   HGB 14.2 03/11/2011   HCT 43.2 03/11/2011   MCV 94.9 03/11/2011   PLT 175 03/11/2011     Chemistry      Component Value Date/Time   NA 141 01/28/2011 1355   K 3.7 01/28/2011 1355   CL 102 01/28/2011 1355   CO2 30 01/28/2011 1355   BUN 15 01/28/2011 1355   CREATININE 0.77 01/28/2011 1355      Component Value Date/Time   CALCIUM 10.0 01/28/2011 1355   ALKPHOS 53 01/02/2011 0811  ALKPHOS 53 01/02/2011 0811   AST 23 01/02/2011 0811   AST 23 01/02/2011 0811   ALT 28 01/02/2011 0811   ALT 28 01/02/2011 0811   BILITOT 1.4* 01/02/2011 0811   BILITOT 1.4* 01/02/2011 0811      Impression and Plan: 75 year old [place] woman status post left lumpectomy in September 2012 for an invasive grade 2 lobular carcinoma, 1 cm, as well as associated DCIS and lobular carcinoma in situ. No lymphovascular  invasion, margins are negative, 0 of 2 lymph nodes involved. Tumor was ER positive, 100%. PR positive, 100%. HER-2/neu negative with MIB-1 of 14%. Due to initiate radiation therapy beginning next week, to be followed by oral anti-estrogen therapy.  The patient continues to do well and will proceed her radiation next week as planned. We will have her return to see Dr. Darnelle Catalan in approximately 6 weeks, near the end of her radiation, to discuss her oral anti-estrogen. She will continue being followed by Dr. Jerl Santos for gout. The patient and her husband who accompanies her here today voice understanding and agreement with this plan. They know to call with any changes or problems.  Spent more than half the time coordinating care.    Cyprian Gongaware, PA-C 11/5/20122:10 PM

## 2011-03-11 NOTE — Progress Notes (Signed)
DIAGNOSIS:  Left breast cancer.  NARRATIVE:  [name] underwent complex simulation and treatment planning on 03/07/2011.  She was brought to the CT scimitar.  She was placed in the prone position on our prone breast board.  After ensuring her comfort and placing a wire on her scar, a CT scan was taken from the thoracic inlet through the diaphragm.  An isocenter was placed in the middle of the breast.  Tattoos were placed.  She tolerated simulation well.  Treatment planning then occurred.  I outlined her tumor volume.  I then placed MLCs protecting critical normal structures such as her heart and lungs.  She tolerated simulation well.  This occurred on 03/07/2011.    ______________________________ Lurline Hare, M.D. SW/MEDQ  D:  03/08/2011  T:  03/11/2011  Job:  [id number]

## 2011-03-11 NOTE — Progress Notes (Signed)
CC:   [name], M.D. [name], M.D. Lowella Dell, M.D.  DIAGNOSIS:  T1 N0 invasive lobular carcinoma of the left breast.  PREVIOUS INTERVENTIONS:  Lumpectomy and sentinel lymph node biopsy by Dr. Cicero Duck on 01/29/2011 revealing a grade 2 lobular carcinoma spanning 1 cm with negative margins and 0/2 lymph nodes positive.  INTERVAL HISTORY:  [place] reports for followup today.  She is feeling much better after recovering from gout which happened to her in the postoperative setting.  She has been released by Dr. Jamey Ripa, but had such difficulties with the gout that has not presented.  She is now healing from that and has been on multiple rounds of prednisone and a steroid injection and really just is finally feeling up to coming in for treatment.  She and her husband have a trip planned  the second week of November for their 53rd anniversary and she would like to start her radiation after that.  PHYSICAL EXAMINATION:  Her breast is really well-healed.  She is an obese female sitting in a wheelchair.  RECOMMENDATIONS:  Ms. Fouty is ready for simulation.  It is okay for her to start treatment on the 19th.  I would like to treat her in the prone position due to her large breasts.  I have discussed this with her and she is willing to give it a try.  We discussed the benefits could be decreased skin toxicity.  We discussed the other effects of treatment including but not limited to fatigue and damage to the heart and lungs. She has agreed to proceed forward with treatment and has signed informed consent.  We will plan on starting her the week of the 19th at her request.    ______________________________ Lurline Hare, M.D. SW/MEDQ  D:  03/08/2011  T:  03/11/2011  Job:  [id number]

## 2011-03-13 ENCOUNTER — Encounter: Payer: Self-pay | Admitting: *Deleted

## 2011-03-13 NOTE — Progress Notes (Signed)
CSW was referred by distress screening protocol. Pt scored a 8 on DT. CSW unable to meet with pt in exam room; therefore CSW called pt at home.  Pt stated she was "doing well", and did not have any urgent needs at this time.  Pt stated she had recently had surgery and was doing well.  Pt's only concern is the pain associated with a recent flair up of gout.  Pt stated she is currently seeing and being treated by her orthopedic doctor.  Pt stated she has had a wonderful experience at [facility] and is pleased with "how personable everyone is".   CSW informed pt of supportive services and members of the support team at [facility].  CSW and pt also discussed location of support team and accessibility.  Pt beings radiation tx next week.  CSW encouraged pt to contact CSW with any non-medical needs or concerns.  Pt was appreciative of contact.

## 2011-03-21 ENCOUNTER — Other Ambulatory Visit: Payer: Self-pay | Admitting: Internal Medicine

## 2011-03-21 ENCOUNTER — Ambulatory Visit
Admission: RE | Admit: 2011-03-21 | Discharge: 2011-03-21 | Disposition: A | Discharge status: Home / Self Care | Payer: Medicare Other | Source: Ambulatory Visit | Attending: Internal Medicine | Admitting: Internal Medicine

## 2011-03-21 DIAGNOSIS — M79673 Pain in unspecified foot: Secondary | ICD-10-CM

## 2011-03-21 MED ORDER — IOHEXOL 180 MG/ML  SOLN
1.0000 mL | Freq: Once | INTRAMUSCULAR | Status: AC | PRN
  Administered 2011-03-21: 1 mL via INTRA_ARTICULAR

## 2011-03-22 ENCOUNTER — Other Ambulatory Visit: Payer: Medicare Other

## 2011-03-22 ENCOUNTER — Telehealth: Payer: Self-pay | Admitting: Radiation Oncology

## 2011-03-22 ENCOUNTER — Encounter: Payer: Self-pay | Admitting: Radiation Oncology

## 2011-03-22 ENCOUNTER — Ambulatory Visit: Admission: RE | Admit: 2011-03-22 | Payer: Medicare Other | Source: Ambulatory Visit | Admitting: Radiation Oncology

## 2011-03-22 NOTE — Telephone Encounter (Signed)
Patient's daughter Aldean Ast) had concerns about her mother's transportation and mobility;  in the home and  to and from her Tx at [facility]. Dau also advised that her mother is heavy with knee and gout problems at this time. Patient was given a Epp and Medicaid Appl to complete. Road to Recovery appl will be faxed today. Patient to see Tamala Julian, Clinical Social Worker, after her Rad Tx on Monday 03/25/2011 @ 10a, for further assistance in this matter.  [name] Lovell Sheehan - [phone] cell

## 2011-03-23 ENCOUNTER — Ambulatory Visit: Payer: Medicare Other

## 2011-03-25 ENCOUNTER — Encounter: Payer: Self-pay | Admitting: *Deleted

## 2011-03-25 ENCOUNTER — Ambulatory Visit: Payer: Medicare Other

## 2011-03-25 NOTE — Progress Notes (Signed)
CSW received referral from Financial Counselor for transportation and home care needs.  CSW was unable to reach pt's daughter, but left a message with contact information.  CSW then contacted the pt at home.  Pt stated her daughter had received some information, but was unsure if they had exactly what they needed.  CSW reviewed transportation and home care resources information with pt, and offered to speak with pt's daughter if she had any questions.  CSW mailed pt information on transportation agencies that could accommodate wheelchairs, and a list of home care agencies in [place].  CSW encouraged the pt or pt's daughter to contact CSW with any questions or concerns.

## 2011-03-26 ENCOUNTER — Ambulatory Visit: Payer: Medicare Other

## 2011-03-27 ENCOUNTER — Ambulatory Visit: Payer: Medicare Other

## 2011-04-01 ENCOUNTER — Ambulatory Visit
Admission: RE | Admit: 2011-04-01 | Discharge: 2011-04-01 | Disposition: A | Discharge status: Home / Self Care | Payer: Medicare Other | Source: Ambulatory Visit | Attending: Radiation Oncology | Admitting: Radiation Oncology

## 2011-04-01 ENCOUNTER — Encounter: Payer: Self-pay | Admitting: *Deleted

## 2011-04-01 DIAGNOSIS — C50419 Malignant neoplasm of upper-outer quadrant of unspecified female breast: Secondary | ICD-10-CM

## 2011-04-01 NOTE — Progress Notes (Signed)
CSW met with pt and pt's husband after her appointment to discuss home care needs, transportation , and financial concerns.  CSW reviewed information previously mailed to pt and offered support.  CSW and pt discussed other resources and the application process.   CSW will collect appropriate applications and discuss with pt at her appointment tomorrow.  Pt also met with the Financial Counselor to discuss the financial assistance application and Medicaid application.  CSW will follow up with pt and her next appointment.

## 2011-04-02 ENCOUNTER — Encounter: Payer: Self-pay | Admitting: Radiation Oncology

## 2011-04-02 ENCOUNTER — Ambulatory Visit
Admission: RE | Admit: 2011-04-02 | Discharge: 2011-04-02 | Disposition: A | Discharge status: Home / Self Care | Payer: Medicare Other | Source: Ambulatory Visit | Attending: Radiation Oncology | Admitting: Radiation Oncology

## 2011-04-02 VITALS — Wt 347.0 lb

## 2011-04-02 DIAGNOSIS — C50919 Malignant neoplasm of unspecified site of unspecified female breast: Secondary | ICD-10-CM

## 2011-04-02 DIAGNOSIS — C50419 Malignant neoplasm of upper-outer quadrant of unspecified female breast: Secondary | ICD-10-CM

## 2011-04-02 MED ORDER — ALRA NON-METALLIC DEODORANT (RAD-ONC)
1.0000 "application " | Freq: Once | TOPICAL | Status: AC
  Administered 2011-04-02: 1 via TOPICAL

## 2011-04-02 MED ORDER — RADIAPLEXRX EX GEL
Freq: Once | CUTANEOUS | Status: AC
  Administered 2011-04-02: 11:00:00 via TOPICAL

## 2011-04-02 NOTE — Progress Notes (Signed)
[facility] Radiation Oncology Simulation Verification Note   Name: [name] MRN: [id number]  Date: 04/02/2011  DOB: 03/02/1936  Status:outpatient    DIAGNOSIS: There were no encounter diagnoses. Breast cancer (ILC) Receptor + her2 _   Primary site: Breast (Left)   Staging method: AJCC 7th Edition   Pathologic: Stage IA (T1, N0, cM0) signed by Zollie Scale, PA on 03/11/2011  2:09 PM   Summary: Stage IA (T1, N0, cM0)   POSITION: Patient is positioned prone and  Isocenter and mlcs were reviewed.. Report was requested of the lateral tangent fields to document posterior field border.   NARRATIVE: She tolerated simulation well.

## 2011-04-02 NOTE — Progress Notes (Signed)
Weekly Management Note Current Dose:  1.8 Gy  Projected Dose:60.4  Gy   Narrative:  The patient presents for routine under treatment assessment.  CBCT/MVCT images/Port film x-rays were reviewed.  The chart was checked. Gout much improved on super dose of prednisone.  RN education performed today.   Physical Findings: Weight: 347 lb (157.398 kg). Unchanged skin. Alert and oriented.  Impression:  The patient is tolerating radiation.  Plan:  Continue treatment as planned. Skin care reviewed.

## 2011-04-02 NOTE — Progress Notes (Signed)
Post educatiion completed. Given alra deodorant and radiaplex meds updated. 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 Currently on 20 mg daily will complete tx next week.

## 2011-04-03 ENCOUNTER — Ambulatory Visit
Admission: RE | Admit: 2011-04-03 | Discharge: 2011-04-03 | Disposition: A | Discharge status: Home / Self Care | Payer: Medicare Other | Source: Ambulatory Visit | Attending: Radiation Oncology | Admitting: Radiation Oncology

## 2011-04-04 ENCOUNTER — Ambulatory Visit
Admission: RE | Admit: 2011-04-04 | Discharge: 2011-04-04 | Disposition: A | Discharge status: Home / Self Care | Payer: Medicare Other | Source: Ambulatory Visit | Attending: Radiation Oncology | Admitting: Radiation Oncology

## 2011-04-05 ENCOUNTER — Ambulatory Visit
Admission: RE | Admit: 2011-04-05 | Discharge: 2011-04-05 | Disposition: A | Discharge status: Home / Self Care | Payer: Medicare Other | Source: Ambulatory Visit | Attending: Radiation Oncology | Admitting: Radiation Oncology

## 2011-04-08 ENCOUNTER — Ambulatory Visit
Admission: RE | Admit: 2011-04-08 | Discharge: 2011-04-08 | Disposition: A | Discharge status: Home / Self Care | Payer: Medicare Other | Source: Ambulatory Visit | Attending: Radiation Oncology | Admitting: Radiation Oncology

## 2011-04-09 ENCOUNTER — Ambulatory Visit
Admission: RE | Admit: 2011-04-09 | Discharge: 2011-04-09 | Disposition: A | Discharge status: Home / Self Care | Payer: Medicare Other | Source: Ambulatory Visit | Attending: Radiation Oncology | Admitting: Radiation Oncology

## 2011-04-09 DIAGNOSIS — C50419 Malignant neoplasm of upper-outer quadrant of unspecified female breast: Secondary | ICD-10-CM

## 2011-04-09 NOTE — Progress Notes (Signed)
Pt. Has completed 6 of 28 radiation treatments to left breast. Breast pink which was this way prior to starting treatment Has some soreness which is deep in muscle.takes tylenol but doesn't help. Intolerant to opiods.

## 2011-04-09 NOTE — Progress Notes (Signed)
Weekly Management Note Current Dose: 10.8   Gy  Projected Dose: 60.4 Gy   Narrative:  The patient presents for routine under treatment assessment.  CBCT/MVCT images/Port film x-rays were reviewed.  The chart was checked. Gout is stable off prednisone for 2 days. Some left intercostal discomfort especially with deep inspiration. ? Position on prone breast board.   Physical Findings: Weight: 344 lb 4.8 oz (156.173 kg). Unchanged. Breast is pink but stable. No bruise in site of inframammary fold.   Impression:  The patient is tolerating radiation.  Plan:  Continue treatment as planned. Continue radiaplex.

## 2011-04-10 ENCOUNTER — Ambulatory Visit
Admission: RE | Admit: 2011-04-10 | Discharge: 2011-04-10 | Disposition: A | Discharge status: Home / Self Care | Payer: Medicare Other | Source: Ambulatory Visit | Attending: Radiation Oncology | Admitting: Radiation Oncology

## 2011-04-11 ENCOUNTER — Ambulatory Visit
Admission: RE | Admit: 2011-04-11 | Discharge: 2011-04-11 | Disposition: A | Discharge status: Home / Self Care | Payer: Medicare Other | Source: Ambulatory Visit | Attending: Radiation Oncology | Admitting: Radiation Oncology

## 2011-04-12 ENCOUNTER — Ambulatory Visit
Admission: RE | Admit: 2011-04-12 | Discharge: 2011-04-12 | Disposition: A | Discharge status: Home / Self Care | Payer: Medicare Other | Source: Ambulatory Visit | Attending: Radiation Oncology | Admitting: Radiation Oncology

## 2011-04-15 ENCOUNTER — Ambulatory Visit
Admission: RE | Admit: 2011-04-15 | Discharge: 2011-04-15 | Disposition: A | Discharge status: Home / Self Care | Payer: Medicare Other | Source: Ambulatory Visit | Attending: Radiation Oncology | Admitting: Radiation Oncology

## 2011-04-15 ENCOUNTER — Other Ambulatory Visit: Payer: Self-pay | Admitting: Physician Assistant

## 2011-04-15 ENCOUNTER — Other Ambulatory Visit (HOSPITAL_BASED_OUTPATIENT_CLINIC_OR_DEPARTMENT_OTHER): Payer: Medicare Other | Admitting: Lab

## 2011-04-15 DIAGNOSIS — C50419 Malignant neoplasm of upper-outer quadrant of unspecified female breast: Secondary | ICD-10-CM

## 2011-04-15 LAB — COMPREHENSIVE METABOLIC PANEL
ALT: 13 U/L (ref 0–35)
AST: 12 U/L (ref 0–37)
Albumin: 3.4 g/dL — ABNORMAL LOW (ref 3.5–5.2)
Calcium: 9.8 mg/dL (ref 8.4–10.5)
Chloride: 100 mEq/L (ref 96–112)
Potassium: 3.2 mEq/L — ABNORMAL LOW (ref 3.5–5.3)

## 2011-04-15 LAB — CBC WITH DIFFERENTIAL/PLATELET
BASO%: 1 % (ref 0.0–2.0)
EOS%: 1.5 % (ref 0.0–7.0)
MCH: 31.9 pg (ref 25.1–34.0)
MCHC: 33.6 g/dL (ref 31.5–36.0)
RDW: 16.1 % — ABNORMAL HIGH (ref 11.2–14.5)
lymph#: 1.1 10*3/uL (ref 0.9–3.3)

## 2011-04-15 LAB — CANCER ANTIGEN 27.29: CA 27.29: 13 U/mL (ref 0–39)

## 2011-04-15 MED ORDER — RADIAPLEXRX EX GEL
Freq: Once | CUTANEOUS | Status: AC
  Administered 2011-04-15: 170 via TOPICAL

## 2011-04-16 ENCOUNTER — Ambulatory Visit
Admission: RE | Admit: 2011-04-16 | Discharge: 2011-04-16 | Disposition: A | Discharge status: Home / Self Care | Payer: Medicare Other | Source: Ambulatory Visit | Attending: Radiation Oncology | Admitting: Radiation Oncology

## 2011-04-16 DIAGNOSIS — C50419 Malignant neoplasm of upper-outer quadrant of unspecified female breast: Secondary | ICD-10-CM

## 2011-04-16 NOTE — Progress Notes (Signed)
Weekly Management Note Current Dose: 19.8 Gy  Projected Dose: 60.4 Gy   Narrative:  The patient presents for routine under treatment assessment.  CBCT/MVCT images/Port film x-rays were reviewed.  The chart was checked.  Doing well. Still some pain when she lies on table or on deep inspiration. Appt with rheumatology.  Physical Findings: Weight: 342 lb 3.2 oz (155.221 kg). Breast pink.   Impression:  The patient is tolerating radiation.  Plan:  Continue treatment as planned.

## 2011-04-16 NOTE — Progress Notes (Signed)
Continued pain on inspiration of left rib-cage.  No visible changes in skin. Completed 11/28 left breast radiation treatments. Had bilateral knee injections Wed. 04/11/11. Mild fatigue.

## 2011-04-17 ENCOUNTER — Ambulatory Visit
Admission: RE | Admit: 2011-04-17 | Discharge: 2011-04-17 | Disposition: A | Discharge status: Home / Self Care | Payer: Medicare Other | Source: Ambulatory Visit | Attending: Radiation Oncology | Admitting: Radiation Oncology

## 2011-04-18 ENCOUNTER — Ambulatory Visit
Admission: RE | Admit: 2011-04-18 | Discharge: 2011-04-18 | Disposition: A | Discharge status: Home / Self Care | Payer: Medicare Other | Source: Ambulatory Visit | Attending: Radiation Oncology | Admitting: Radiation Oncology

## 2011-04-19 ENCOUNTER — Ambulatory Visit
Admission: RE | Admit: 2011-04-19 | Discharge: 2011-04-19 | Disposition: A | Discharge status: Home / Self Care | Payer: Medicare Other | Source: Ambulatory Visit | Attending: Radiation Oncology | Admitting: Radiation Oncology

## 2011-04-22 ENCOUNTER — Ambulatory Visit
Admission: RE | Admit: 2011-04-22 | Discharge: 2011-04-22 | Disposition: A | Discharge status: Home / Self Care | Payer: Medicare Other | Source: Ambulatory Visit | Attending: Radiation Oncology | Admitting: Radiation Oncology

## 2011-04-22 ENCOUNTER — Ambulatory Visit (HOSPITAL_BASED_OUTPATIENT_CLINIC_OR_DEPARTMENT_OTHER): Payer: Medicare Other | Admitting: Oncology

## 2011-04-22 VITALS — BP 130/70 | HR 61 | Temp 97.8°F | Ht 66.0 in | Wt 344.6 lb

## 2011-04-22 DIAGNOSIS — C50419 Malignant neoplasm of upper-outer quadrant of unspecified female breast: Secondary | ICD-10-CM

## 2011-04-22 DIAGNOSIS — C50919 Malignant neoplasm of unspecified site of unspecified female breast: Secondary | ICD-10-CM

## 2011-04-22 DIAGNOSIS — Z170 Estrogen receptor positive status [ER+]: Secondary | ICD-10-CM

## 2011-04-22 DIAGNOSIS — R29818 Other symptoms and signs involving the nervous system: Secondary | ICD-10-CM

## 2011-04-22 NOTE — Progress Notes (Signed)
ID: [name]   Interval History: The patient returns today for followup of her breast cancer. Since her last visit here she had her definitive surgery, January 29, 2011, showing a 1 cm invasive lobular breast cancer, grade 2, with ample margins, and 0 of 2 sentinel lymph nodes involved. She did very well with the surgery, without significant problems with pain, bleeding, dehiscence, or swelling. She is now receiving radiation.  ROS the day she started radiation something happened, she is not quite sure what, while they were measuring her. Since that time she has had a significant pain under her left breast, which is constant. It occurs during breathing but is not particularly pleuritic. It is there all the time. It kept her from doing "anything" his last week. Otherwise she is tolerating her radiation well, with no significant skin changes to date. She did develop gout after her surgery. She's been on steroids for that and has been unable to wean off it. She tells me Dr. Earl Gala checked for crystals and there were none. She will see a rheumatologist today regarding that.    Medications: I have reviewed the patient's current medications.  Current Outpatient Prescriptions  Medication Sig Dispense Refill  . acetaminophen (TYLENOL) 325 MG tablet Take 500 mg by mouth every 6 (six) hours as needed.        Marland Kitchen albuterol (PROVENTIL HFA;VENTOLIN HFA) 108 (90 BASE) MCG/ACT inhaler Inhale 2 puffs into the lungs as needed.        . ALPRAZolam (XANAX) 0.5 MG tablet Take 0.5 mg by mouth 2 (two) times daily as needed.        Marland Kitchen atenolol (TENORMIN) 50 MG tablet Take 50 mg by mouth daily.        . fexofenadine (ALLEGRA) 180 MG tablet Take 180 mg by mouth daily.        Marland Kitchen FLUoxetine (PROZAC) 20 MG capsule Take 20 mg by mouth daily.        . fluticasone (VERAMYST) 27.5 MCG/SPRAY nasal spray Place 2 sprays into the nose daily.        . Fluticasone-Salmeterol (ADVAIR) 500-50 MCG/DOSE AEPB Inhale 1 puff into the lungs  every 12 (twelve) hours.        . hydrochlorothiazide 25 MG tablet Take 50 mg by mouth once.        Marland Kitchen levothyroxine (SYNTHROID, LEVOTHROID) 50 MCG tablet Take 50 mcg by mouth daily.        . montelukast (SINGULAIR) 10 MG tablet Take 10 mg by mouth at bedtime.        . potassium chloride (KLOR-CON) 20 MEQ packet Take 20 mEq by mouth 1 day or 1 dose.       . Rivaroxaban (XARELTO) 20 MG TABS Take by mouth daily.        . traMADol (ULTRAM) 50 MG tablet Take 50 mg by mouth every 6 (six) hours as needed.        . predniSONE (DELTASONE) 20 MG tablet Take 20 mg by mouth daily. startee 20 mg daily on 04/01/11 for post surgical gout bilat. Feet greater on right.       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 Her husband wrong is present today. Daughter Cher Franzoni lives in [place]; she is a wound treatment specialist. Daughter Mariea Stable also lives in [place], and is an Airline pilot. Son Ainara Eldridge died at the age of 51 from what seems to  have been a heart attack. The patient has 2 grandchildren.   Objective:  Filed Vitals:   04/22/11 1142  BP: 130/70  Pulse: 61  Temp: 97.8 F (36.6 C)   Body mass index is 55.62 kg/(m^2).  Physical Exam:    Sclerae unicteric  Oropharynx clear  No peripheral adenopathy  Lungs clear -- no rales or rhonchi  Heart regular rate and rhythm  Abdomen benign  MSK no focal spinal tenderness, no peripheral edema; but see breast exam  Neuro nonfocal  Breast exam: Right breast no suspicious masses; the left breast is minimally erythematous secondary to the radiation, with no skin breakdown. Under the left breast in the inframammary fold there is minimal irritation, possibly an early dermatophytosis. The real issue however is slightly lower than that and laterally, beginning at the anterior axillary line level perhaps rib #10 or 11. There is significant focal tenderness there and then moving  laterally up the rib about 3 inches or so there is a second focus of focal tenderness.  Lab Results:  CMP    Chemistry      Component Value Date/Time   NA 138 04/15/2011 0948   K 3.2* 04/15/2011 0948   CL 100 04/15/2011 0948   CO2 27 04/15/2011 0948   BUN 16 04/15/2011 0948   CREATININE 0.83 04/15/2011 0948      Component Value Date/Time   CALCIUM 9.8 04/15/2011 0948   ALKPHOS 58 04/15/2011 0948   AST 12 04/15/2011 0948   ALT 13 04/15/2011 0948   BILITOT 1.3* 04/15/2011 0948       CBC Lab Results  Component Value Date   WBC 6.7 04/15/2011   HGB 14.0 04/15/2011   HCT 41.6 04/15/2011   MCV 94.9 04/15/2011   PLT 157 04/15/2011   NEUTROABS 4.8 04/15/2011    Studies/Results:  No new results found. Next mammogram will be at [facility] August of 2013.  Assessment: 75 year old [place] woman status post left lumpectomy and sentinel lymph node sampling 01/29/2011 for a T1BN0 (stage I) invasive lobular breast cancer, grade 2, estrogen receptor 100% positive, progesterone receptor 100% positive, with no HER-2 amplification, and then MIB-1 of 14%; currently undergoing radiation.   Plan: I am not sure why she has such severe focal tenderness in the left rib cage area but we will obtain plain films at to assess that. I also wrote her for a Lidoderm patches and instructed her on how to use those.  As far as her breast cancer is concerned she has a very good prognosis overall. Nonetheless I recommended of letrozole to start when radiation is completed. This will not only cut in half her already low risk of recurrence, but also cut in half her risk of developing a new breast cancer. We discussed the possible toxicities side effects and complications of anti-estrogen treatment. She will see me again in late March. If she is tolerating letrozole well at that time the plan will be to continue it for 5 years. She knows to call for any problems that may develop before the next  visit  Prestyn Stanco C 04/22/2011

## 2011-04-23 ENCOUNTER — Other Ambulatory Visit: Payer: Self-pay | Admitting: *Deleted

## 2011-04-23 ENCOUNTER — Encounter: Payer: Self-pay | Admitting: Radiation Oncology

## 2011-04-23 ENCOUNTER — Ambulatory Visit (HOSPITAL_COMMUNITY)
Admission: RE | Admit: 2011-04-23 | Discharge: 2011-04-23 | Disposition: A | Discharge status: Home / Self Care | Payer: Medicare Other | Source: Ambulatory Visit | Attending: Oncology | Admitting: Oncology

## 2011-04-23 ENCOUNTER — Telehealth: Payer: Self-pay | Admitting: *Deleted

## 2011-04-23 ENCOUNTER — Other Ambulatory Visit: Payer: Self-pay | Admitting: Oncology

## 2011-04-23 ENCOUNTER — Ambulatory Visit
Admission: RE | Admit: 2011-04-23 | Discharge: 2011-04-23 | Disposition: A | Discharge status: Home / Self Care | Payer: Medicare Other | Source: Ambulatory Visit | Attending: Radiation Oncology | Admitting: Radiation Oncology

## 2011-04-23 VITALS — BP 173/79 | HR 61 | Resp 18 | Wt 344.0 lb

## 2011-04-23 DIAGNOSIS — C50919 Malignant neoplasm of unspecified site of unspecified female breast: Secondary | ICD-10-CM

## 2011-04-23 DIAGNOSIS — C50419 Malignant neoplasm of upper-outer quadrant of unspecified female breast: Secondary | ICD-10-CM

## 2011-04-23 DIAGNOSIS — R0789 Other chest pain: Secondary | ICD-10-CM | POA: Insufficient documentation

## 2011-04-23 NOTE — Progress Notes (Signed)
Patient presents to the clinic today accompanied by her husband for under treat visit with Dr. Roselind Messier. Patient is alert and oriented to person, place, and time. No distress noted. Pleasant affect noted. Patient being pushed in wheelchair due to generalized weakness. Patient reports sharp left rib pain 10 on a scale of 0-10 related to lengthy treatment time today. Patient in prone position while addition xrays were taken. Patient reports "it felt like I was laying on top of a rock." Provided patient with cool wash cloth. Patient reports that she will apply her lidocaine pain patch once she returns home. Patient has no other complaints at this time.

## 2011-04-23 NOTE — Telephone Encounter (Signed)
ave patient instructions on walking in for chest x-ray in radiology gave patient appointment for late march printed out calendar and gave to the patient

## 2011-04-23 NOTE — Progress Notes (Signed)
DIAGNOSIS:  Left breast cancer.  NARRATIVE:  [name] is seen today for weekly assessment.  She has completed 2880 cGy of a planned 6040 cGy directed at the left breast area.  The patient does have a lot of discomfort with her prone position on the treatment table.  The patient complains rib pain associated with this setup.  The patient is intolerant of many pain medications but can take tramadol.  I have recommend the patient take this 2 to 3 hours prior to her radiation treatment.  EXAMINATION:  Lungs:  Clear.  Heart:  Regular rhythm and rate.  The patient's weight today is 344 pounds.  The patient is sitting in a wheelchair at this time.  Examination of the left breast area reveals some radiation dermatitis in the upper-inner aspect of the breast and mild changes within the inframammary fold.  There is no skin breakdown or obvious signs of infection in the breast.  IMPRESSION AND PLAN:  The patient is tolerating her treatments reasonably well except for issues as above.  Dr. Darnelle Catalan has given the patient a lidocaine patch to place on her skin to see if this will be helpful.  I have recommended the patient not place this prior to coming in for her treatment in light of the potential of skin bolus effect. Plan is to continue to a cumulative dose of 6040 cGy.    ______________________________ [name], Ph.D., M.D. JDK/MEDQ  D:  04/23/2011  T:  04/23/2011  Job:  [id number]

## 2011-04-24 ENCOUNTER — Ambulatory Visit
Admission: RE | Admit: 2011-04-24 | Discharge: 2011-04-24 | Disposition: A | Discharge status: Home / Self Care | Payer: Medicare Other | Source: Ambulatory Visit | Attending: Radiation Oncology | Admitting: Radiation Oncology

## 2011-04-25 ENCOUNTER — Ambulatory Visit
Admission: RE | Admit: 2011-04-25 | Discharge: 2011-04-25 | Disposition: A | Discharge status: Home / Self Care | Payer: Medicare Other | Source: Ambulatory Visit | Attending: Radiation Oncology | Admitting: Radiation Oncology

## 2011-04-26 ENCOUNTER — Ambulatory Visit
Admission: RE | Admit: 2011-04-26 | Discharge: 2011-04-26 | Disposition: A | Discharge status: Home / Self Care | Payer: Medicare Other | Source: Ambulatory Visit | Attending: Radiation Oncology | Admitting: Radiation Oncology

## 2011-04-29 ENCOUNTER — Ambulatory Visit
Admission: RE | Admit: 2011-04-29 | Discharge: 2011-04-29 | Disposition: A | Discharge status: Home / Self Care | Payer: Medicare Other | Source: Ambulatory Visit | Attending: Radiation Oncology | Admitting: Radiation Oncology

## 2011-05-01 ENCOUNTER — Ambulatory Visit: Payer: Medicare Other | Admitting: Radiation Oncology

## 2011-05-01 ENCOUNTER — Ambulatory Visit
Admission: RE | Admit: 2011-05-01 | Discharge: 2011-05-01 | Disposition: A | Discharge status: Home / Self Care | Payer: Medicare Other | Source: Ambulatory Visit | Attending: Radiation Oncology | Admitting: Radiation Oncology

## 2011-05-01 DIAGNOSIS — C50419 Malignant neoplasm of upper-outer quadrant of unspecified female breast: Secondary | ICD-10-CM

## 2011-05-01 NOTE — Progress Notes (Signed)
Weekly Management Note Current Dose: 37.8  Gy  Projected Dose: 60.4 Gy   Narrative:  The patient presents for routine under treatment assessment.  CBCT/MVCT images/Port film x-rays were reviewed.  The chart was checked.  Pulling under breast. Back on prednisone. Using cool washcloth to protect inframammary fold.   Physical Findings: Weight: 354 lb (160.573 kg). Moist desquamation in inframammary fold. Rest of breast is slightly pink with dermatitis medially.  Impression:  The patient is tolerating radiation.  Plan:  Continue treatment as planned. Keep inframmammary fold dry. Skin likely more sensitive due to steroids. Can use hydrocortisone medially.

## 2011-05-01 NOTE — Progress Notes (Signed)
Pt states skin under L breast "pulls and feels like it'll tear". Skin intact at this time, hyperpigmented. Applying Radiaplex. Radiation dermatitis above breast, advised she may apply Cortisone cream prn. She states "it has only itched a little".  Pt states in past 2 weeks she has noticed occass "red spots on her face"; inquiring if this is due to radiation. Advised her the radiation is specific to area being tx, would not cause red areas on her face. Pt states "they do not hurt or itch and go away".  Pt on prednisone per PCP for "pseudo-gout of toe".

## 2011-05-02 ENCOUNTER — Ambulatory Visit
Admission: RE | Admit: 2011-05-02 | Discharge: 2011-05-02 | Disposition: A | Discharge status: Home / Self Care | Payer: Medicare Other | Source: Ambulatory Visit | Attending: Radiation Oncology | Admitting: Radiation Oncology

## 2011-05-02 ENCOUNTER — Ambulatory Visit: Payer: Medicare Other | Admitting: Radiation Oncology

## 2011-05-03 ENCOUNTER — Ambulatory Visit
Admission: RE | Admit: 2011-05-03 | Discharge: 2011-05-03 | Disposition: A | Discharge status: Home / Self Care | Payer: Medicare Other | Source: Ambulatory Visit | Attending: Radiation Oncology | Admitting: Radiation Oncology

## 2011-05-06 ENCOUNTER — Ambulatory Visit
Admission: RE | Admit: 2011-05-06 | Discharge: 2011-05-06 | Disposition: A | Discharge status: Home / Self Care | Payer: Medicare Other | Source: Ambulatory Visit | Attending: Radiation Oncology | Admitting: Radiation Oncology

## 2011-05-07 ENCOUNTER — Ambulatory Visit: Payer: Medicare Other

## 2011-05-08 ENCOUNTER — Ambulatory Visit
Admission: RE | Admit: 2011-05-08 | Discharge: 2011-05-08 | Disposition: A | Discharge status: Home / Self Care | Payer: Medicare Other | Source: Ambulatory Visit | Attending: Radiation Oncology | Admitting: Radiation Oncology

## 2011-05-08 DIAGNOSIS — C50419 Malignant neoplasm of upper-outer quadrant of unspecified female breast: Secondary | ICD-10-CM

## 2011-05-08 NOTE — Progress Notes (Signed)
DID NOT GET WT TODAY DUE TO PT BEING IN PAIN, RATES 10/10 AT LEFT RIB AREA, WORSE WITH ANY MOVEMENT.  USES ULTRAM WHICH HELPS IF SHE LIES STILL BUT STILL HAS PAIN WITH MOVEMENT.  SKIN BRIGHT RED WITH RADIATION DERMATITIS NOTED ON CHEST WITH WET DESQUAMATION UNDER BREAST.  USING HYDROCORTISONE ON RASH AND USING RADIAPLEX UNDER BREAST.

## 2011-05-08 NOTE — Progress Notes (Signed)
Name: [name]   MRN: [id number]  Date:      DOB: 07-05-1935  Status:outpatient    DIAGNOSIS: Breast cancer.  CONSENT VERIFIED: yes   SET UP: Patient is setup supine   IMMOBILIZATION:  The following immobilization was used:Custom Moldable Pillow, breast board.   NARRATIVE: [name] underwent complex simulation and treatment planning for her boost treatment today.  She has been treated prone and was brought in today to resimulate her supine for her boost. A CT scan was taken from the thoracic inlet to the diaphragm. Her tumor volume was outlined on the planning CT scan.  Due to the depth of her cavity, electrons may not be used and a photon plan could be developed.  MLCs, an isodose plan and a special port plan are requested.

## 2011-05-08 NOTE — Progress Notes (Signed)
Weekly Management Note Current Dose:  45 Gy  Projected Dose:  60.4 Gy   Narrative:  The patient presents for routine under treatment assessment.  CBCT/MVCT images/Port film x-rays were reviewed.  The chart was checked. Doing well. Moist desquamation in inframammary folds but no smell. Using tramadol for rib pain which continues if she takes a deep breath. Hydrocortisone relieving itching medially.   Physical Findings: Dry desquamation medially. Moist desquamation in inframammary folds with associated skin peeling.   Impression:  The patient is tolerating radiation.  Plan:  Continue treatment as planned. Non adherent dressings and neosporin+pain to inframammary fold. Continue hydrocortisone to medial area.

## 2011-05-09 ENCOUNTER — Other Ambulatory Visit: Payer: Self-pay | Admitting: Radiation Oncology

## 2011-05-09 ENCOUNTER — Ambulatory Visit
Admission: RE | Admit: 2011-05-09 | Discharge: 2011-05-09 | Disposition: A | Discharge status: Home / Self Care | Payer: Medicare Other | Source: Ambulatory Visit | Attending: Radiation Oncology | Admitting: Radiation Oncology

## 2011-05-09 DIAGNOSIS — C50419 Malignant neoplasm of upper-outer quadrant of unspecified female breast: Secondary | ICD-10-CM

## 2011-05-09 MED ORDER — TRAMADOL HCL 50 MG PO TABS: ORAL_TABLET | ORAL | Status: DC

## 2011-05-10 ENCOUNTER — Ambulatory Visit
Admission: RE | Admit: 2011-05-10 | Discharge: 2011-05-10 | Disposition: A | Discharge status: Home / Self Care | Payer: Medicare Other | Source: Ambulatory Visit | Attending: Radiation Oncology | Admitting: Radiation Oncology

## 2011-05-10 DIAGNOSIS — C50419 Malignant neoplasm of upper-outer quadrant of unspecified female breast: Secondary | ICD-10-CM

## 2011-05-10 NOTE — Progress Notes (Signed)
[facility] Radiation Oncology Simulation Verification Note   Name: [name] MRN: [id number]   Date: 05/09/2010 DOB: [birth year]  Status:outpatient   DIAGNOSIS: No diagnosis found.  POSITION: Patient was placed in the supine position on the treatment machine.  Isocenter and MLCS were reviewed and treatment was approved.  NARRATIVE: Patient tolerated simulation well.

## 2011-05-13 ENCOUNTER — Ambulatory Visit
Admission: RE | Admit: 2011-05-13 | Discharge: 2011-05-13 | Disposition: A | Discharge status: Home / Self Care | Payer: Medicare Other | Source: Ambulatory Visit | Attending: Radiation Oncology | Admitting: Radiation Oncology

## 2011-05-14 ENCOUNTER — Ambulatory Visit
Admission: RE | Admit: 2011-05-14 | Discharge: 2011-05-14 | Disposition: A | Discharge status: Home / Self Care | Payer: Medicare Other | Source: Ambulatory Visit | Attending: Radiation Oncology | Admitting: Radiation Oncology

## 2011-05-14 DIAGNOSIS — C50419 Malignant neoplasm of upper-outer quadrant of unspecified female breast: Secondary | ICD-10-CM

## 2011-05-14 NOTE — Progress Notes (Signed)
Weekly Management Note Current Dose: 52.4  Gy  Projected Dose: 60.4 Gy   Narrative:  The patient presents for routine under treatment assessment.  CBCT/MVCT images/Port film x-rays were reviewed.  The chart was checked. Less apin in her ribs. Skin stable under breast. Healing medially.   Physical Findings: Weight: 346 lb 6.4 oz (157.126 kg). Unchanged. Moist desquamation under left breast is clean. No signs of infection. Rest of breast is pink.  Impression:  The patient is tolerating radiation.  Plan:  Continue treatment as planned. Continue current regimen under breast until healed. On boost now so hopefully inframammary folds will heal faster now. F/u 2 weeks after tx.

## 2011-05-14 NOTE — Progress Notes (Signed)
C/O PAIN AT LEFT RIB AND BREAST 7/10.  TAKING TRAMADOL WITH 75% RELIEF.  NOTED DESQUAMATED AREA UNDER BREAST, SKIN BRIGHT RED IN APPEARANCE. C/O BEING SLEEPY AND TIRED, TOLD HER THIS WAS NOT UNUSUAL AND A SIDE EFFECT OF TX.

## 2011-05-15 ENCOUNTER — Ambulatory Visit
Admission: RE | Admit: 2011-05-15 | Discharge: 2011-05-15 | Disposition: A | Discharge status: Home / Self Care | Payer: Medicare Other | Source: Ambulatory Visit | Attending: Radiation Oncology | Admitting: Radiation Oncology

## 2011-05-16 ENCOUNTER — Ambulatory Visit
Admission: RE | Admit: 2011-05-16 | Discharge: 2011-05-16 | Disposition: A | Discharge status: Home / Self Care | Payer: Medicare Other | Source: Ambulatory Visit | Attending: Radiation Oncology | Admitting: Radiation Oncology

## 2011-05-17 ENCOUNTER — Ambulatory Visit
Admission: RE | Admit: 2011-05-17 | Discharge: 2011-05-17 | Disposition: A | Discharge status: Home / Self Care | Payer: Medicare Other | Source: Ambulatory Visit | Attending: Radiation Oncology | Admitting: Radiation Oncology

## 2011-05-20 ENCOUNTER — Ambulatory Visit
Admission: RE | Admit: 2011-05-20 | Discharge: 2011-05-20 | Disposition: A | Discharge status: Home / Self Care | Payer: Medicare Other | Source: Ambulatory Visit | Attending: Radiation Oncology | Admitting: Radiation Oncology

## 2011-05-20 ENCOUNTER — Encounter: Payer: Self-pay | Admitting: Radiation Oncology

## 2011-05-20 VITALS — Wt 346.0 lb

## 2011-05-20 DIAGNOSIS — C50419 Malignant neoplasm of upper-outer quadrant of unspecified female breast: Secondary | ICD-10-CM

## 2011-05-20 NOTE — Progress Notes (Signed)
   Weekly Management Note Current Dose:  6040 cGy  Projected Dose: 6040 cGy   Narrative:  The patient presents for routine under treatment assessment.  CBCT/MVCT images/Port film x-rays were reviewed.  The chart was checked. She is doing well.  She has noted healing in the inframammary fold.    Physical Findings: Weight: 346 lb (156.945 kg).  Her left breast demonstrates diffuse erythema with healing (now dry) desquamation in the inframammary fold.  Impression:  The patient is tolerating radiotherapy.  Plan:  Continue radiotherapy as planned. Continue skin regimen per Dr. Michell Heinrich. She'll see her in follow up next week.

## 2011-05-20 NOTE — Progress Notes (Signed)
NOTED BRIGHT RED SKIN  WITH MOIST DESQUAMATION UNDER BREAST, ALSO RADIATION DERMATITIS ON CHEST.  USING RADIAPLEX AND PREDNISONE CREAM.  HAS AN APPOINTMENT TO SEE DR. Michell Heinrich IN 2 WEEKS PER DR. Michell Heinrich.  ALSO DEALING WITH PAIN ON LEFT SIDE DUE TO FX RIBS BUT IS GETTING BETTER, TAKES TRAMADOL FOR THIS WITH RELIEF

## 2011-05-21 ENCOUNTER — Encounter (INDEPENDENT_AMBULATORY_CARE_PROVIDER_SITE_OTHER): Payer: Self-pay | Admitting: Surgery

## 2011-05-21 ENCOUNTER — Ambulatory Visit (INDEPENDENT_AMBULATORY_CARE_PROVIDER_SITE_OTHER): Payer: Medicare Other | Admitting: Surgery

## 2011-05-21 VITALS — BP 130/82 | HR 70 | Temp 97.6°F | Resp 18 | Ht 67.0 in | Wt 350.2 lb

## 2011-05-21 DIAGNOSIS — C50419 Malignant neoplasm of upper-outer quadrant of unspecified female breast: Secondary | ICD-10-CM

## 2011-05-21 NOTE — Progress Notes (Signed)
[name]    [id number] 05/21/2011    [birth year]   CC: Post op lumpectomy  HPI: The patient returns for post op follow-up. She underwent a left lumpectomy and SLN on 01/29/2011. Over all she feels that she is doing well. She has had mild incisional pain at the lumpectomy site. She has completed radiation yesterday and feels she tolerated it well. Notes some fullness and deformity of the nipple area  PE: The incision is well healed. The entire breast is red from rads. She is a little tender. Lumpectomy site looks and feels OK.    DATA REVIEWED: I  IMPRESSION: Patient doing well.Tolerated radiation  PLAN: Her next visit will be in about six months.

## 2011-05-21 NOTE — Patient Instructions (Signed)
See me in six months. You should have a mammogram about that time. Call if any problems or questions

## 2011-05-28 ENCOUNTER — Encounter: Payer: Self-pay | Admitting: Radiation Oncology

## 2011-05-28 ENCOUNTER — Ambulatory Visit
Admission: RE | Admit: 2011-05-28 | Discharge: 2011-05-28 | Disposition: A | Discharge status: Home / Self Care | Payer: Medicare Other | Source: Ambulatory Visit | Attending: Radiation Oncology | Admitting: Radiation Oncology

## 2011-05-28 DIAGNOSIS — C50419 Malignant neoplasm of upper-outer quadrant of unspecified female breast: Secondary | ICD-10-CM

## 2011-05-28 NOTE — Progress Notes (Signed)
[facility]    Radiation Oncology [address] [place]     Maryln Gottron, M.D. [place], [place] [address]               Billie Lade, M.D., Ph.D. Phone: [phone]      Molli Hazard A. Kathrynn Running, M.D. Fax: [phone]      Radene Gunning, M.D., Ph.D.         Lurline Hare, M.D.         Grayland Jack, M.D.         Follow Up Note    Name: [name]       MRN: [id number] Date: 05/28/2011        DOB: 09/17/1935    CC: Darnelle Bos, MD, MD         Streck     Diagnosis:  Patient Active Problem List  Diagnoses  . Malignant neoplasm of upper-outer quadrant of female breast     Previous Radiation: 60.4 Gy completed 05/21/11  Interval Since Treatment: one week  Interval History: [name] presents for follow up today.  Her skin has healed nicely. She is using hydrogen peroxide on a piece of gauze under her breast.  She still is sore. She has not started her aromatase inhibitor yet. She has a follow up with Dr. Donnie Coffin and Dr. Jamey Ripa.   Physical Examination:  Breasts: left breast normal without mass, skin is slightly pink.  Inframmary folds are well healed with some skin peeling but no moist desquamation. Skin is intact.   Filed Vitals:   05/28/11 0957  BP: 108/71  Pulse: 63  Temp: 97.8 F (36.6 C)  TempSrc: Oral  Resp: 20  Weight: 349 lb 8 oz (158.532 kg)    Impression: Doing well post radiation  Recommendations: Start normal lotion everywhere except under breast. Let that dry for a few more weeks.  Follow up in 2 weeks. Start AI.     ____________________________________________ Lurline Hare, M.D.

## 2011-05-28 NOTE — Progress Notes (Signed)
SKIN LOOKS BETTER, NOTED PINK IN COLOR WITH SOME DRY DESQUAMATION UNDER BREAST.  NO USING ANYTHING ON BREAST NOW

## 2011-06-06 ENCOUNTER — Ambulatory Visit: Admission: RE | Admit: 2011-06-06 | Payer: Medicare Other | Source: Ambulatory Visit | Admitting: Radiation Oncology

## 2011-06-13 ENCOUNTER — Other Ambulatory Visit: Payer: Self-pay | Admitting: Radiation Oncology

## 2011-06-13 ENCOUNTER — Encounter: Payer: Self-pay | Admitting: Radiation Oncology

## 2011-06-13 ENCOUNTER — Ambulatory Visit
Admission: RE | Admit: 2011-06-13 | Discharge: 2011-06-13 | Disposition: A | Discharge status: Home / Self Care | Payer: Medicare Other | Source: Ambulatory Visit | Attending: Radiation Oncology | Admitting: Radiation Oncology

## 2011-06-13 DIAGNOSIS — C50419 Malignant neoplasm of upper-outer quadrant of unspecified female breast: Secondary | ICD-10-CM

## 2011-06-13 MED ORDER — TRAMADOL HCL 50 MG PO TABS: 50.0000 mg | ORAL_TABLET | Freq: Three times a day (TID) | ORAL | Status: DC | PRN

## 2011-06-13 NOTE — Progress Notes (Signed)
CC:   [name], M.D. [name], M.D. [name], M.D.  DIAGNOSIS:  Left breast cancer.  PREVIOUS RADIATION:  60.4 Gy completed 05/21/2011.  INTERVAL SINCE TREATMENT:  1 month.  INTERVAL HISTORY:  [name] reports for followup today.  She continues to have pain on her left side, especially with deep inspiration.  X-rays performed in December do show acute left 7th rib fracture.  She has not had any difficulties with her gout and is seeing the rheumatologist tomorrow.  She is taking the letrozole as prescribed by [name] and is tolerating that well.  She reports her skin is perfectly well healed and looks wonderful.  PHYSICAL EXAMINATION:  Her left breast is normal.  There is some darkening laterally but otherwise looks good.  Skin is intact and the inframammary fold is well healed.  IMPRESSION:  Left breast cancer status post radiation with resolved acute effects of treatment.  RECOMMENDATIONS:  [name] is doing well.  It is likely a combination of her position on the prone belly board in combination with her brittle bones secondary to prolonged prednisone use contributed to her left rib fracture.  She is still using tramadol and requests a refill of this, which I have e-prescribed.  I will plan on seeing her back in 6 months. She has followup scheduled with [name] in the interim.    ______________________________ Lurline Hare, M.D. SW/MEDQ  D:  06/13/2011  T:  06/13/2011  Job:  [id number]

## 2011-06-13 NOTE — Progress Notes (Signed)
Here for fu of left breast ca and skin .   SKIN HEALED!!!!  LOOKS AWESOME.  HAS STARTED LETRAZOLE

## 2011-06-15 NOTE — Progress Notes (Signed)
[facility] Radiation Oncology Simulation Verification Note   Name: [name] MRN: [id number]   Date: 04/01/2011 DOB: 08-29-1935  Status:outpatient   DIAGNOSIS:  1. Malignant neoplasm of upper-outer quadrant of female breast     POSITION: Patient was placed in the supine position on the treatment machine.  Isocenter and MLCS were reviewed and treatment was approved.  NARRATIVE: Patient tolerated simulation well.

## 2011-06-15 NOTE — Progress Notes (Signed)
CC:   [name], M.D. Lowella Dell, M.D.  DIAGNOSIS:  T1b N0 left breast cancer.  TREATMENT DATES:  04/02/2011 to 05/20/2011.  ANATOMIC REGION TREATED: 1. Left breast. 2. Left breast boost.  DOSE: 1. 50.4 Gy at 1.8 Gy per fraction x28 fractions. 2. 10 Gy at 2 Gy per fraction x5 fractions.  BEAM ARRANGEMENT: 1. Opposed tangents with reduced fields in the prone position. 2. AP with posterior oblique in the supine position.  BEAM ENERGY: 1. 6 and 10 MV photons. 2. 6 and 10 MV photons.  TREATMENT TOLERANCE:  Ms. Eisenstein tolerated her treatments relatively well.  She experienced a significant amount of rib pain throughout the course of her treatment which was thought to be secondary to positioning.  She did unfortunately have some moist desquamation which was treated with Neosporin.  FOLLOWUP:  I will plan on seeing Ms. Belflower back in 1 month's time.  She also has regular scheduled followup with Dr. Jamey Ripa and Dr. Darnelle Catalan. She knows to contact us with any questions or concerns in the interim. I will see her back in 2 weeks just for a skin check.  I should also note that Ms. Lupien treatment was delayed due to a gout flareup.    ______________________________ Lurline Hare, M.D. SW/MEDQ  D:  06/15/2011  T:  06/15/2011  Job:  [id number]

## 2011-06-18 ENCOUNTER — Encounter (INDEPENDENT_AMBULATORY_CARE_PROVIDER_SITE_OTHER): Payer: Self-pay | Admitting: Surgery

## 2011-06-25 ENCOUNTER — Other Ambulatory Visit: Payer: Self-pay | Admitting: *Deleted

## 2011-06-25 DIAGNOSIS — C50419 Malignant neoplasm of upper-outer quadrant of unspecified female breast: Secondary | ICD-10-CM

## 2011-06-25 MED ORDER — LETROZOLE 2.5 MG PO TABS: 2.5000 mg | ORAL_TABLET | Freq: Every day | ORAL | Status: DC

## 2011-07-22 ENCOUNTER — Telehealth: Payer: Self-pay | Admitting: Oncology

## 2011-07-22 ENCOUNTER — Ambulatory Visit (HOSPITAL_BASED_OUTPATIENT_CLINIC_OR_DEPARTMENT_OTHER): Payer: Medicare Other | Admitting: Oncology

## 2011-07-22 ENCOUNTER — Encounter: Payer: Self-pay | Admitting: *Deleted

## 2011-07-22 ENCOUNTER — Other Ambulatory Visit: Payer: Self-pay | Admitting: Oncology

## 2011-07-22 ENCOUNTER — Ambulatory Visit (HOSPITAL_COMMUNITY)
Admission: RE | Admit: 2011-07-22 | Discharge: 2011-07-22 | Disposition: A | Discharge status: Home / Self Care | Payer: Medicare Other | Source: Ambulatory Visit | Attending: Oncology | Admitting: Oncology

## 2011-07-22 ENCOUNTER — Other Ambulatory Visit (HOSPITAL_BASED_OUTPATIENT_CLINIC_OR_DEPARTMENT_OTHER): Payer: Medicare Other | Admitting: Lab

## 2011-07-22 VITALS — BP 132/75 | HR 71 | Temp 97.9°F | Ht 67.0 in | Wt 349.7 lb

## 2011-07-22 DIAGNOSIS — IMO0001 Reserved for inherently not codable concepts without codable children: Secondary | ICD-10-CM | POA: Insufficient documentation

## 2011-07-22 DIAGNOSIS — C50919 Malignant neoplasm of unspecified site of unspecified female breast: Secondary | ICD-10-CM

## 2011-07-22 DIAGNOSIS — C50419 Malignant neoplasm of upper-outer quadrant of unspecified female breast: Secondary | ICD-10-CM

## 2011-07-22 DIAGNOSIS — Z170 Estrogen receptor positive status [ER+]: Secondary | ICD-10-CM

## 2011-07-22 DIAGNOSIS — I517 Cardiomegaly: Secondary | ICD-10-CM | POA: Insufficient documentation

## 2011-07-22 DIAGNOSIS — N899 Noninflammatory disorder of vagina, unspecified: Secondary | ICD-10-CM

## 2011-07-22 DIAGNOSIS — R079 Chest pain, unspecified: Secondary | ICD-10-CM | POA: Insufficient documentation

## 2011-07-22 LAB — COMPREHENSIVE METABOLIC PANEL
ALT: 17 U/L (ref 0–35)
AST: 18 U/L (ref 0–37)
Alkaline Phosphatase: 69 U/L (ref 39–117)
Calcium: 9.5 mg/dL (ref 8.4–10.5)
Chloride: 104 mEq/L (ref 96–112)
Creatinine, Ser: 0.87 mg/dL (ref 0.50–1.10)
Total Bilirubin: 1.3 mg/dL — ABNORMAL HIGH (ref 0.3–1.2)

## 2011-07-22 LAB — CBC WITH DIFFERENTIAL/PLATELET
BASO%: 0.4 % (ref 0.0–2.0)
EOS%: 4.8 % (ref 0.0–7.0)
HCT: 38.9 % (ref 34.8–46.6)
MCH: 31.5 pg (ref 25.1–34.0)
MCHC: 33.6 g/dL (ref 31.5–36.0)
MONO%: 11.9 % (ref 0.0–14.0)
NEUT%: 64.4 % (ref 38.4–76.8)
lymph#: 0.9 10*3/uL (ref 0.9–3.3)

## 2011-07-22 NOTE — Progress Notes (Signed)
RECEIVED A FAX FROM [facility] CONCERNING A RESULTS OF LEFT RIBS AND CHEST 3+VIEW. THIS REPORT WAS GIVEN TO DR.MAGRINAT.

## 2011-07-22 NOTE — Progress Notes (Signed)
ID: [name]   DOB: 20-Oct-1935  MR#: [id number]  ION#:[id number]  HISTORY OF PRESENT ILLNESS: [name] had an unremarkable screening mammogram August of 2011.  On 12/18/2010 however a potential abnormality was noted in Mercedes left breast and she was recalled for additional views 08/16.  Dr. Tilda Burrow was able to demonstrate a focal irregular density in Mercedes upper outer quadrant of Mercedes left breast associated with linear microcalcifications.  By ultrasound a 9 mm hypoechoic mass was noted at Mercedes corresponding area.  This warranted biopsy and a biopsy was performed under ultrasound guidance 08/21.  Mercedes pathology showed ([id number]) an invasive lobular carcinoma, E cadherin negative, associated with ductal carcinoma in situ.  Mercedes invasive lobular carcinoma was HER-2 negative with a ratio by CISH of 1.23.  Estrogen receptor was 100% positive, progesterone receptor was 100% positive and Mercedes proliferation marker was 14%.  With this information [name] was referred to Dr. Jamey Ripa and bilateral breast MRIs were obtained 12/28/2010.  In Mercedes upper outer quadrant of Mercedes left breast there was a small irregular nodule measuring 1.2 cm maximally.  There were no other suspicious abnormalities in Mercedes left breast or in Mercedes right breast.  There were no suspicious internal mammary or axillary lymph nodes identified.  With this information [name] is referred for further evaluation and treatment.  INTERVAL HISTORY: This Mercedes last visit here [name] completed her radiation treatments. She has some dry desquamation but no other significant problems. She started her letrozole in February of 2013, and has tolerated that well.  REVIEW OF SYSTEMS: She did have hot flashes for a couple of weeks but does have a ready subsided. She has problems with vaginal dryness or in any case irritation. There is no discharge. This is chronic. It is not worsen before. She had significant pain related to a left seventh rib fracture which she  recalls occurred in Mercedes very first day of radiation. She still hurts some in that area particularly when she takes a deep breath but Mercedes pain is considerably improved. She would be very interested in seeing a repeat film of that area at present. Otherwise a detailed review of systems was stable  PAST MEDICAL HISTORY: Past Medical History  Diagnosis Date  . Anxiety   . Allergy   . Asthma   . 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 Incontinence of urine   . Plantar fasciitis   . Breast cancer (ILC) Receptor + her2 _ 12/26/2010    left  . Gout     post operative  . Atrial fibrillation 01/21/11  . Malignant neoplasm of upper-outer quadrant of female breast 12/26/2010  Mercedes past medical history is significant for hypertension, asthma, hypothyroidism, osteoarthritis/degenerative disk disease with particular problems with her right knee, history of depression, history of paresthesias, hypercholesterolemia, allergic rhinitis, osteopenia, status post tonsillectomy, status post cholecystectomy, status post right hand surgery, history of fractures to Mercedes arms and legs, history of coronary artery disease status post stenting of her right coronary with a good cath report in 2001.  She is status post D and C and cryosurgery.  PAST SURGICAL HISTORY: Past Surgical History  Procedure Date  . Tonsillectomy 1955  . Cholecystectomy 1955  . Hand surgery 1992    tendons between thumb and forefinger  . Coronary angioplasty with stent placement 2000  . Skin biopsy     1994, 2006, 2008, 2010, 2011, 2011, 2012-  pre-cancerous  . Dilation and curettage of uterus  1976    and 1996  . Breast lumpectomy w/ needle localization 01/29/2011    Left with SLN Dr Jamey Ripa    FAMILY HISTORY Family History  Problem Relation Age of Onset  . Heart disease Father   . Cancer Father     intestinal polyps  . Asthma Sister   . Heart disease Brother   [name]'s father had surgery for cancerous polyps in Mercedes colon at age  54.  He died at age 57 from a heart attack.  [name]'s mother died at age 29.  [name] has 1 brother alive at age 44 and in good health.  She has 1 sister also in good health.  There is no family history of breast or ovarian cancer.  GYNECOLOGIC HISTORY: She had menarche at age 71, last period in 80.  She took hormone replacement for less than a year, stopping in 1996.  She carried 3 children to term, Mercedes first one at age 47.  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  She is also an active Teaching laboratory technician.  She is now retired.  Her husband, Autym Siess, is present today as are her daughters, Girgis and [name].  Jill Stopka is 52, lives here in [place] and is a wound treatment specialist.  Mariea Stable also lives in [place] and is an Airline pilot.  Son, Jerzi Tigert, died at Mercedes age of 36 from what seems to have been a heart attack.  [name] has 2 grandchildren.   ADVANCED DIRECTIVES:  HEALTH MAINTENANCE: History  Substance Use Topics  . Smoking status: Former Smoker -- 1.0 packs/day    Quit date: 12/05/1962  . Smokeless tobacco: Never Used  . Alcohol Use: No     Colonoscopy:  PAP:  Bone density:  Lipid panel:  Allergies  Allergen Reactions  . Codeine Nausea Only and Anxiety    Also complained of dizziness.  Has same problems with oxycodone and hydrocodone  . Contrast Media (Iodinated Diagnostic Agents) Nausea And Vomiting    IV dye for MRI  . Crestor (Rosuvastatin Calcium)     Severe liver function problems  . Lipitor (Atorvastatin Calcium)     Elevated liver function   . Quinine Derivatives Nausea Only    dizzy  . Sulfa Antibiotics Nausea Only    Also complained of dizziness  . Vicodin (Hydrocodone-Acetaminophen)     Itching all over  . Epinephrine     [name] becomes "shaky"  . Penicillins Rash    Pt reported all over body rash in 1958  . Theophyllines     [name] becomes  "shaky"    Current Outpatient Prescriptions  Medication Sig Dispense Refill  . acetaminophen (TYLENOL) 325 MG tablet Take 500 mg by mouth every 6 (six) hours as needed.        Marland Kitchen albuterol (PROVENTIL HFA;VENTOLIN HFA) 108 (90 BASE) MCG/ACT inhaler Inhale 2 puffs into Mercedes lungs as needed.        Marland Kitchen allopurinol (ZYLOPRIM) 100 MG tablet Take 100 mg by mouth daily.      Marland Kitchen ALPRAZolam (XANAX) 0.5 MG tablet Take 0.5 mg by mouth 2 (two) times daily as needed.        Marland Kitchen atenolol (TENORMIN) 50 MG tablet Take 50 mg by mouth daily.        . cholecalciferol (VITAMIN D) 1000 UNITS tablet Take 1,000 Units by mouth daily.      Marland Kitchen  colchicine 0.6 MG tablet Take 0.6 mg by mouth daily.      . fexofenadine (ALLEGRA) 180 MG tablet Take 180 mg by mouth daily.        Marland Kitchen FLUoxetine (PROZAC) 20 MG capsule Take 20 mg by mouth daily.        . fluticasone (FLONASE) 50 MCG/ACT nasal spray       . Fluticasone-Salmeterol (ADVAIR) 500-50 MCG/DOSE AEPB Inhale 1 puff into Mercedes lungs every 12 (twelve) hours.        . hydrochlorothiazide 25 MG tablet Take 50 mg by mouth once.        Marland Kitchen letrozole (FEMARA) 2.5 MG tablet Take 1 tablet (2.5 mg total) by mouth daily.  30 tablet  1  . levothyroxine (SYNTHROID, LEVOTHROID) 50 MCG tablet Take 50 mcg by mouth daily.        . montelukast (SINGULAIR) 10 MG tablet Take 10 mg by mouth at bedtime.        . potassium chloride (KLOR-CON) 20 MEQ packet Take 20 mEq by mouth 1 day or 1 dose.       . Rivaroxaban (XARELTO) 20 MG TABS Take by mouth daily.        . traMADol (ULTRAM) 50 MG tablet Take 1 tablet (50 mg total) by mouth every 8 (eight) hours as needed for pain. Take 1 pill prior to radiation treatment.  60 tablet  0  . predniSONE (DELTASONE) 20 MG tablet         OBJECTIVE: [name] examined in a wheelchair Filed Vitals:   07/22/11 1022  BP: 132/75  Pulse: 71  Temp: 97.9 F (36.6 C)     Body mass index is 54.77 kg/(m^2).    ECOG FS: 2  Sclerae unicteric Oropharynx clear No  peripheral adenopathy Lungs no rales or rhonchi Heart regular rate and rhythm Abd obese, benign MSK minimal discomfort to direct palpation over Mercedes area of fracture in Mercedes left seventh rib cage Neuro: nonfocal Breasts: Right breast is unremarkable. Left breast is status post lumpectomy and radiation. No evidence of local recurrence  LAB RESULTS: Lab Results  Component Value Date   WBC 5.0 07/22/2011   NEUTROABS 3.2 07/22/2011   HGB 13.1 07/22/2011   HCT 38.9 07/22/2011   MCV 93.6 07/22/2011   PLT 134* 07/22/2011      Chemistry      Component Value Date/Time   NA 138 04/15/2011 0948   K 3.2* 04/15/2011 0948   CL 100 04/15/2011 0948   CO2 27 04/15/2011 0948   BUN 16 04/15/2011 0948   CREATININE 0.83 04/15/2011 0948      Component Value Date/Time   CALCIUM 9.8 04/15/2011 0948   ALKPHOS 58 04/15/2011 0948   AST 12 04/15/2011 0948   ALT 13 04/15/2011 0948   BILITOT 1.3* 04/15/2011 0948       Lab Results  Component Value Date   LABCA2 13 04/15/2011    No components found with this basename: LABCA125    No results found for this basename: INR:1;PROTIME:1 in Mercedes last 168 hours  Urinalysis No results found for this basename: colorurine, appearanceur, labspec, phurine, glucoseu, hgbur, bilirubinur, ketonesur, proteinur, urobilinogen, nitrite, leukocytesur       STUDIES: No new results found.  ASSESSMENT: 76 year old [place] [name] status post left lumpectomy and sentinel lymph node sampling 01/29/2011 for a T1BN0 (stage I) invasive lobular breast cancer, grade 2, estrogen receptor 100% positive, progesterone receptor 100% positive, with no HER-2 amplification, and then MIB-1 of  14%; s/p radiation completed January 2013, on letrozole as of February 2013   PLAN: She is tolerating Mercedes letrozole well. Mercedes big concern with this medication of course his bone density issues. She tells me Dr. Earl Gala is planning to start her on zoledronic acid (Reclast). I would strongly  support that decision. Essential would take care of that particular concern regarding letrozole. She is already on vitamin D 1000 units daily. I have asked her to resume her calcium 600 mg by mouth twice a day.  She'll see [place] again in 6 months and we will continue to follow her every 6 months for Mercedes first 2 years after which we will see her on a once a year basis. She will continue to have mammography yearly at [facility]. With her next mammographic study due in August.  Ruthann Cancer C    07/22/2011

## 2011-07-22 NOTE — Telephone Encounter (Signed)
gve the pt her f/u appt for sept along with the plain films x-ray of the lft ribs for today

## 2011-11-20 ENCOUNTER — Encounter (INDEPENDENT_AMBULATORY_CARE_PROVIDER_SITE_OTHER): Payer: Medicare Other | Admitting: Surgery

## 2011-12-10 ENCOUNTER — Encounter (INDEPENDENT_AMBULATORY_CARE_PROVIDER_SITE_OTHER): Payer: Self-pay | Admitting: Surgery

## 2011-12-10 ENCOUNTER — Ambulatory Visit (INDEPENDENT_AMBULATORY_CARE_PROVIDER_SITE_OTHER): Payer: Medicare Other | Admitting: Surgery

## 2011-12-10 VITALS — BP 120/72 | HR 60 | Resp 18 | Ht 67.0 in | Wt 337.0 lb

## 2011-12-10 DIAGNOSIS — Z853 Personal history of malignant neoplasm of breast: Secondary | ICD-10-CM

## 2011-12-10 NOTE — Patient Instructions (Signed)
Continue annual followups here. If the area in the left armpit changes or becomes more prominent we will need to recheck you and likely obtain an ultrasound of the area.

## 2011-12-10 NOTE — Progress Notes (Signed)
NAME: [name]       DOB: 05-15-1935           DATE: 12/10/2011       MRN: [id number]   [name] is a 76 y.o.Marland Kitchenfemale who presents for routine followup of her Left breast cancer, IDC, Stage I, LOQ, diagnosed in 2012 and treated with lumpectomy and radiation. She noted a questionable area in the right but apparently mammogram and sono neg. She also notes a somewhat tender area anterior to the SLN axillary incision, plus some abnormal feeling fatty tissue below the left breast over the ribs. She notes that she had a rib Fx at the beginning of radiation PFSH: She has had no significant changes since the last visit here.  ROS: There have been no significant changes since the last visit here  EXAM:  VS: BP 120/72  Pulse 60  Resp 18  Ht 5\' 7"  (1.702 m)  Wt 337 lb (152.862 kg)  BMI 52.78 kg/m2   General: The patient is alert, oriented, generally healthy appearing, NAD. Mood and affect are normal.  Breasts:  Right breast is wnl. Left shows minimla changes of radiation, not tender no suspicious areas  Lymphatics: She has no axillary or supraclavicular adenopathy on either side.The area in question is at the lateral edge of the pec major, tender, but no mass.  Extremities: Full ROM of the surgical side with no lymphedema noted.  ABD: Over the left rib chage below the breast is some nodulariyt to the fatty tissue, almost suggestive of lipoma  Data Reviewed: Mammo reportedly negative, due again next Auguat  Impression: Doing well, with no evidence of recurrent cancer or new cancer  Plan: Will continue to follow up on an annual basis here.

## 2011-12-11 ENCOUNTER — Ambulatory Visit
Admission: RE | Admit: 2011-12-11 | Discharge: 2011-12-11 | Disposition: A | Discharge status: Home / Self Care | Payer: Medicare Other | Source: Ambulatory Visit | Attending: Radiation Oncology | Admitting: Radiation Oncology

## 2011-12-11 ENCOUNTER — Encounter: Payer: Self-pay | Admitting: Radiation Oncology

## 2011-12-11 VITALS — BP 156/77 | HR 66 | Temp 98.2°F | Resp 22 | Wt 338.6 lb

## 2011-12-11 DIAGNOSIS — C50419 Malignant neoplasm of upper-outer quadrant of unspecified female breast: Secondary | ICD-10-CM

## 2011-12-11 NOTE — Progress Notes (Signed)
Department of Radiation Oncology  Phone:  [phone] Fax:        [phone]   Name: [name]   DOB: 04-22-1936  MRN: [id number]    Date: 12/11/2011  Follow Up Visit Note  Diagnosis: Left breast cancer  Interval since last radiation: 6 months  Interval History: [name] presents today for routine followup.  Her left rib fractures healed up and she is no longer having pain there. She's been put on allopurinol and prednisone but really has not been bothered too much for her gout. She had negative mammograms in April when she presented to her primary care physician with a palpable right breast mass. Both of these were negative and showed no evidence of disease in the right breast and no evidence of tumor recurrence on the left breast. She saw Dr. Jamey Ripa yesterday. She is scheduled to see medical oncology in September. She has not noticed anything new or palpable other than some subcutaneous nodules in her abdomen. These are present on the left as well as on the right. She feels they are getting bigger and also that they're becoming more painful. She said she has been on antibiotics twice in the past few months for dental procedures and these bumps have gotten better while on antibiotics as well as when she was on prednisone for her gout. [name] and her primary care physician and Dr. Jamey Ripa. Both were concerned this could be sequelae of radiation. She reports no headaches new joint aches or nausea.  Allergies:  Allergies  Allergen Reactions  . Codeine Nausea Only and Anxiety    Also complained of dizziness.  Has same problems with oxycodone and hydrocodone  . Contrast Media (Iodinated Diagnostic Agents) Nausea And Vomiting    IV dye for MRI  . Crestor (Rosuvastatin Calcium)     Severe liver function problems  . Lipitor (Atorvastatin Calcium)     Elevated liver function   . Quinine Derivatives Nausea Only    dizzy  . Sulfa Antibiotics Nausea Only    Also complained of dizziness  .  Vicodin (Hydrocodone-Acetaminophen)     Itching all over  . Epinephrine     Patient becomes "shaky"  . Penicillins Rash    Pt reported all over body rash in 1958  . Theophyllines     Patient becomes "shaky"    Medications:  Current Outpatient Prescriptions  Medication Sig Dispense Refill  . albuterol (PROVENTIL HFA;VENTOLIN HFA) 108 (90 BASE) MCG/ACT inhaler Inhale 2 puffs into the lungs as needed.        Marland Kitchen alendronate (FOSAMAX) 70 MG tablet       . allopurinol (ZYLOPRIM) 100 MG tablet Take 100 mg by mouth daily.      Marland Kitchen ALPRAZolam (XANAX) 0.5 MG tablet Take 0.5 mg by mouth 2 (two) times daily as needed.        Marland Kitchen atenolol (TENORMIN) 50 MG tablet Take 50 mg by mouth daily.        Marland Kitchen CLINDAMYCIN HCL PO Take 50 mg by mouth daily.      . colchicine (COLCRYS) 0.6 MG tablet Take 0.6 mg by mouth daily.      . fexofenadine (ALLEGRA) 180 MG tablet Take 180 mg by mouth daily.        Marland Kitchen FLUoxetine (PROZAC) 20 MG capsule Take 20 mg by mouth daily.        . fluticasone (FLONASE) 50 MCG/ACT nasal spray       . Fluticasone-Salmeterol (ADVAIR) 500-50 MCG/DOSE AEPB  Inhale 1 puff into the lungs every 12 (twelve) hours.        . hydrochlorothiazide 25 MG tablet Take 50 mg by mouth once.        Marland Kitchen KLOR-CON M20 20 MEQ tablet       . letrozole (FEMARA) 2.5 MG tablet Take 1 tablet (2.5 mg total) by mouth daily.  30 tablet  1  . levothyroxine (SYNTHROID, LEVOTHROID) 50 MCG tablet Take 50 mcg by mouth daily.        . montelukast (SINGULAIR) 10 MG tablet Take 10 mg by mouth at bedtime.        . predniSONE (DELTASONE) 5 MG tablet       . Rivaroxaban (XARELTO) 20 MG TABS Take by mouth daily.          Physical Exam:   weight is 338 lb 9.6 oz (153.588 kg). Her oral temperature is 98.2 F (36.8 C). Her blood pressure is 156/77 and her pulse is 66. Her respiration is 22.  She is an obese female sitting comfortably on examining chair. I examined her on the chair as it is too difficult for her to get up on the  examining room table. She has large pendulous breasts bilaterally. She has some very mild skin hyperpigmentation underneath the left breast. She has some palpable tenderness on the pectoralis muscle near her left axilla. She has a palpable subcutaneous nodule starting about the mid axillary line extending over to the contralateral mid axillary line in the upper abdomen. These are out of the radiation field.  IMPRESSION: [name] is a 76 y.o. female who is status post breast conservation on the left with no evidence of disease  PLAN:  And is no evidence of disease from her breast cancer standpoint. I think what she is feeling her probably just falls. They are getting larger and more painful however so I ordered an ultrasound of this area. These are is very superficial and should be easy to see on ultrasound. I will plan on seeing her back in 6 months and we will give her the results of her ultrasound or the phone.    Lurline Hare, MD

## 2011-12-11 NOTE — Progress Notes (Signed)
Patient presents to the clinic today accompanied by her husband for a follow up appointment with Dr. Michell Heinrich. Patient is alert and oriented to person, place, and time. No distress noted. Slow steady gait noted with assistance of rolling walker. Pleasant as always affect noted. Patient denies pain at this time. However, patient reports that her left axilla and left side are intermittently sore. Patient reports that her left rib fracture has healed well. Patient reports her incisions from breast surgery are remarkable. Patient reports continued tanning under left breast but, denies applying any topical lotion. Patient reports that she saw Dr. Jamey Ripa yesterday. Patient scheduled to see Zollie Scale 9/19. Patient denies nipple discharge. Patient denies nausea, vomiting, headache, dizziness or diarrhea. Reported all findings to Dr. Michell Heinrich.

## 2011-12-12 ENCOUNTER — Telehealth: Payer: Self-pay | Admitting: Radiation Oncology

## 2011-12-12 ENCOUNTER — Ambulatory Visit: Payer: Medicare Other | Admitting: Radiation Oncology

## 2011-12-12 NOTE — Telephone Encounter (Signed)
Arranged patient's ultrasound with Helmut Muster in radiology scheduling for Monday, December 16, 2011 at 0900. Called patient at home number listed in demographics. Instructed patient to arrive at 8:45 am to register for a 9:00 ultrasound. Instructed patient to be NPO after midnight on 12/15/2011. Patient verbalized understanding. Provided Merla Riches with information and she is obtaining prior authorization. Routed this message to Dr. Michell Heinrich.

## 2011-12-16 ENCOUNTER — Ambulatory Visit (HOSPITAL_COMMUNITY)
Admission: RE | Admit: 2011-12-16 | Discharge: 2011-12-16 | Disposition: A | Discharge status: Home / Self Care | Payer: Medicare Other | Source: Ambulatory Visit | Attending: Radiation Oncology | Admitting: Radiation Oncology

## 2011-12-16 ENCOUNTER — Other Ambulatory Visit: Payer: Self-pay | Admitting: Radiation Oncology

## 2011-12-16 ENCOUNTER — Ambulatory Visit: Payer: Medicare Other | Admitting: Physician Assistant

## 2011-12-16 DIAGNOSIS — C50419 Malignant neoplasm of upper-outer quadrant of unspecified female breast: Secondary | ICD-10-CM

## 2011-12-16 DIAGNOSIS — R1909 Other intra-abdominal and pelvic swelling, mass and lump: Secondary | ICD-10-CM | POA: Insufficient documentation

## 2011-12-20 ENCOUNTER — Telehealth: Payer: Self-pay | Admitting: Radiation Oncology

## 2011-12-20 NOTE — Telephone Encounter (Signed)
Faxed copy of abdominal ultrasound report attention Sharlyn Bologna at [facility]. Confirmation fax of delivery obtained.

## 2012-01-23 ENCOUNTER — Encounter: Payer: Self-pay | Admitting: Physician Assistant

## 2012-01-23 ENCOUNTER — Ambulatory Visit (HOSPITAL_BASED_OUTPATIENT_CLINIC_OR_DEPARTMENT_OTHER): Payer: Medicare Other | Admitting: Physician Assistant

## 2012-01-23 ENCOUNTER — Other Ambulatory Visit (HOSPITAL_BASED_OUTPATIENT_CLINIC_OR_DEPARTMENT_OTHER): Payer: Medicare Other | Admitting: Lab

## 2012-01-23 ENCOUNTER — Telehealth: Payer: Self-pay | Admitting: *Deleted

## 2012-01-23 VITALS — BP 119/66 | HR 55 | Temp 97.9°F | Resp 20 | Ht 67.0 in | Wt 334.9 lb

## 2012-01-23 DIAGNOSIS — M949 Disorder of cartilage, unspecified: Secondary | ICD-10-CM

## 2012-01-23 DIAGNOSIS — C50419 Malignant neoplasm of upper-outer quadrant of unspecified female breast: Secondary | ICD-10-CM

## 2012-01-23 DIAGNOSIS — R17 Unspecified jaundice: Secondary | ICD-10-CM

## 2012-01-23 DIAGNOSIS — E559 Vitamin D deficiency, unspecified: Secondary | ICD-10-CM

## 2012-01-23 DIAGNOSIS — Z170 Estrogen receptor positive status [ER+]: Secondary | ICD-10-CM

## 2012-01-23 LAB — CBC WITH DIFFERENTIAL/PLATELET
BASO%: 0.2 % (ref 0.0–2.0)
EOS%: 2.9 % (ref 0.0–7.0)
HGB: 13.9 g/dL (ref 11.6–15.9)
LYMPH%: 20.3 % (ref 14.0–49.7)
MCH: 30.9 pg (ref 25.1–34.0)
MCHC: 32.1 g/dL (ref 31.5–36.0)
MCV: 96.2 fL (ref 79.5–101.0)
MONO#: 0.6 10*3/uL (ref 0.1–0.9)
NEUT#: 4.5 10*3/uL (ref 1.5–6.5)
NEUT%: 67.6 % (ref 38.4–76.8)
RBC: 4.5 10*6/uL (ref 3.70–5.45)
lymph#: 1.4 10*3/uL (ref 0.9–3.3)

## 2012-01-23 LAB — COMPREHENSIVE METABOLIC PANEL (CC13)
ALT: 19 U/L (ref 0–55)
Albumin: 3.5 g/dL (ref 3.5–5.0)
BUN: 16 mg/dL (ref 7.0–26.0)
CO2: 29 mEq/L (ref 22–29)
Calcium: 9.9 mg/dL (ref 8.4–10.4)
Chloride: 103 mEq/L (ref 98–107)
Creatinine: 0.9 mg/dL (ref 0.6–1.1)

## 2012-01-23 MED ORDER — LETROZOLE 2.5 MG PO TABS: 2.5000 mg | ORAL_TABLET | Freq: Every day | ORAL | Status: DC

## 2012-01-23 NOTE — Telephone Encounter (Signed)
02-04-2012 2:15pm  07-15-2012 lab only 2:00  07-22-2012 md at 1:30

## 2012-01-23 NOTE — Progress Notes (Signed)
ID: [name]   DOB: 12-28-1935  MR#: [id number]  CSN#:[id number]  HISTORY OF PRESENT ILLNESS: Ms. Papson had an unremarkable screening mammogram August of 2011.  On 12/18/2010 however a potential abnormality was noted in the left breast and she was recalled for additional views 08/16.  Dr. Tilda Burrow was able to demonstrate a focal irregular density in the upper outer quadrant of the left breast associated with linear microcalcifications.  By ultrasound a 9 mm hypoechoic mass was noted at the corresponding area.  This warranted biopsy and a biopsy was performed under ultrasound guidance 08/21.  The pathology showed ([id number]) an invasive lobular carcinoma, E cadherin negative, associated with ductal carcinoma in situ.  The invasive lobular carcinoma was HER-2 negative with a ratio by CISH of 1.23.  Estrogen receptor was 100% positive, progesterone receptor was 100% positive and the proliferation marker was 14%.  With this information the patient was referred to Dr. Jamey Ripa and bilateral breast MRIs were obtained 12/28/2010.  In the upper outer quadrant of the left breast there was a small irregular nodule measuring 1.2 cm maximally.  There were no other suspicious abnormalities in the left breast or in the right breast.  There were no suspicious internal mammary or axillary lymph nodes identified.  With this information the patient is referred for further evaluation and treatment.  INTERVAL HISTORY: Deyanara returns today accompanied by her husband, [name], for routine six-month followup of her left breast carcinoma. She continues on letrozole which she is tolerating well. She has no problems with hot flashes. She does have some pre-existing joint pain, especially in her knees, but has not noticed an increase in the pain since beginning the letrozole. She does note that her skin is dry her than usual.  Interval history is notable for and having recently had an abdominal ultrasound to evaluate a "mass" in her left  abdomen. This was found to be a lipoma. Currently, she denies any pain in that area.  REVIEW OF SYSTEMS: Alexix is feeling well overall. She has had no recent illnesses and denies any fevers or chills. She denies any problems with vaginal dryness, discharge, or abnormal bleeding. She's eating and drinking well with no nausea or recent change in bowel or bladder habits. No cough or phlegm production. She has some shortness of breath with exertion which is not new. No chest pain or palpitations. No abnormal headaches or dizziness.  A detailed review of systems is otherwise noncontributory.  PAST MEDICAL HISTORY: Past Medical History  Diagnosis Date  . Anxiety   . Allergy   . Asthma   . 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 Incontinence of urine   . Plantar fasciitis   . Breast cancer ([facility]) Receptor + her2 _ 12/26/2010    left  . Gout     post operative  . Atrial fibrillation 01/21/11  . Malignant neoplasm of upper-outer quadrant of female breast 12/26/2010  The past medical history is significant for hypertension, asthma, hypothyroidism, osteoarthritis/degenerative disk disease with particular problems with her right knee, history of depression, history of paresthesias, hypercholesterolemia, allergic rhinitis, osteopenia, status post tonsillectomy, status post cholecystectomy, status post right hand surgery, history of fractures to the arms and legs, history of coronary artery disease status post stenting of her right coronary with a good cath report in 2001.  She is status post D and C and cryosurgery.  PAST SURGICAL HISTORY: Past Surgical History  Procedure Date  . Tonsillectomy 1955  . Cholecystectomy  1955  . Hand surgery 1992    tendons between thumb and forefinger  . Coronary angioplasty with stent placement 2000  . Skin biopsy     1994, 2006, 2008, 2010, 2011, 2011, 2012-  pre-cancerous  . Dilation and curettage of uterus 1976    and 1996  . Breast lumpectomy w/ needle  localization 01/29/2011    Left with [facility] Dr Jamey Ripa    FAMILY HISTORY Family History  Problem Relation Age of Onset  . Heart disease Father   . Cancer Father     intestinal polyps  . Asthma Sister   . Heart disease Brother   The patient's father had surgery for cancerous polyps in the colon at age 12.  He died at age 26 from a heart attack.  The patient's mother died at age 22.  The patient has 1 brother alive at age 19 and in good health.  She has 1 sister also in good health.  There is no family history of breast or ovarian cancer.  GYNECOLOGIC HISTORY: She had menarche at age 39, last period in 58.  She took hormone replacement for less than a year, stopping in 1996.  She carried 3 children to term, the first one at age 47.  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  She is also an active Teaching laboratory technician.  She is now retired.  Her husband, Jessyca Sloan, is present today as are her daughters, Vicens and [name].  Brytni Dray is 87, lives here in [place] and is a wound treatment specialist.  Mariea Stable also lives in [place] and is an Airline pilot.  Son, Julieana Eshleman, died at the age of 68 from what seems to have been a heart attack.  The patient has 2 grandchildren.   ADVANCED DIRECTIVES:  HEALTH MAINTENANCE: History  Substance Use Topics  . Smoking status: Former Smoker -- 1.0 packs/day    Quit date: 12/05/1962  . Smokeless tobacco: Never Used  . Alcohol Use: No     Colonoscopy:  PAP:  Bone density:  Lipid panel:  Allergies  Allergen Reactions  . Codeine Nausea Only and Anxiety    Also complained of dizziness.  Has same problems with oxycodone and hydrocodone  . Contrast Media (Iodinated Diagnostic Agents) Nausea And Vomiting    IV dye for MRI  . Crestor (Rosuvastatin Calcium)     Severe liver function problems  . Lipitor (Atorvastatin Calcium)     Elevated liver function   . Quinine  Derivatives Nausea Only    dizzy  . Sulfa Antibiotics Nausea Only    Also complained of dizziness  . Vicodin (Hydrocodone-Acetaminophen)     Itching all over  . Epinephrine     Patient becomes "shaky"  . Penicillins Rash    Pt reported all over body rash in 1958  . Theophyllines     Patient becomes "shaky"    Current Outpatient Prescriptions  Medication Sig Dispense Refill  . albuterol (PROVENTIL HFA;VENTOLIN HFA) 108 (90 BASE) MCG/ACT inhaler Inhale 2 puffs into the lungs as needed.        Marland Kitchen alendronate (FOSAMAX) 70 MG tablet       . allopurinol (ZYLOPRIM) 100 MG tablet Take 300 mg by mouth daily.       Marland Kitchen ALPRAZolam (XANAX) 0.5 MG tablet Take 0.5 mg by mouth 2 (two) times daily as needed.        Marland Kitchen atenolol (TENORMIN)  50 MG tablet Take 50 mg by mouth daily.        . colchicine (COLCRYS) 0.6 MG tablet Take 0.6 mg by mouth daily.      . ergocalciferol (VITAMIN D2) 50000 UNITS capsule Take 50,000 Units by mouth once a week.      . fexofenadine (ALLEGRA) 180 MG tablet Take 180 mg by mouth daily.        Marland Kitchen FLUoxetine (PROZAC) 20 MG capsule Take 20 mg by mouth daily.        . fluticasone (FLONASE) 50 MCG/ACT nasal spray       . Fluticasone-Salmeterol (ADVAIR) 500-50 MCG/DOSE AEPB Inhale 1 puff into the lungs every 12 (twelve) hours.        . hydrochlorothiazide 25 MG tablet Take 50 mg by mouth once.        Marland Kitchen KLOR-CON M20 20 MEQ tablet       . letrozole (FEMARA) 2.5 MG tablet Take 1 tablet (2.5 mg total) by mouth daily.  90 tablet  3  . levothyroxine (SYNTHROID, LEVOTHROID) 50 MCG tablet Take 50 mcg by mouth daily.        . montelukast (SINGULAIR) 10 MG tablet Take 10 mg by mouth at bedtime.        . Rivaroxaban (XARELTO) 20 MG TABS Take by mouth daily.        . predniSONE (DELTASONE) 5 MG tablet         OBJECTIVE: Elderly white woman examined in a wheelchair Filed Vitals:   01/23/12 1402  BP: 119/66  Pulse: 55  Temp: 97.9 F (36.6 C)  Resp: 20     Body mass index is 52.45  kg/(m^2).    ECOG FS: 2  Sclerae unicteric Oropharynx clear No peripheral adenopathy Lungs no rales or rhonchi Heart regular rate and rhythm Abd obese, benign, soft with probably lipoma palpated in upper left quadrant, nontender;  positive bowel sounds MSK , no focal spinal tenderness to palpation Neuro: nonfocal, alert and oriented x3 Breasts: Right breast is unremarkable. Left breast is status post lumpectomy and radiation. No evidence of local recurrence. Axillae are benign bilaterally with no adenopathy palpated  LAB RESULTS: Lab Results  Component Value Date   WBC 6.7 01/23/2012   NEUTROABS 4.5 01/23/2012   HGB 13.9 01/23/2012   HCT 43.3 01/23/2012   MCV 96.2 01/23/2012   PLT 145 01/23/2012      Chemistry      Component Value Date/Time   NA 142 01/23/2012 1323   NA 141 07/22/2011 0957   K 3.8 01/23/2012 1323   K 3.6 07/22/2011 0957   CL 103 01/23/2012 1323   CL 104 07/22/2011 0957   CO2 29 01/23/2012 1323   CO2 25 07/22/2011 0957   BUN 16.0 01/23/2012 1323   BUN 15 07/22/2011 0957   CREATININE 0.9 01/23/2012 1323   CREATININE 0.87 07/22/2011 0957      Component Value Date/Time   CALCIUM 9.9 01/23/2012 1323   CALCIUM 9.5 07/22/2011 0957   ALKPHOS 84 01/23/2012 1323   ALKPHOS 69 07/22/2011 0957   AST 15 01/23/2012 1323   AST 18 07/22/2011 0957   ALT 19 01/23/2012 1323   ALT 17 07/22/2011 0957   BILITOT 2.00* 01/23/2012 1323   BILITOT 1.3* 07/22/2011 0957       Lab Results  Component Value Date   LABCA2 11 07/22/2011     STUDIES: 12/16/2011 *RADIOLOGY REPORT*  Clinical Data: History of breast cancer, now with palpable  superficial  soft tissue nodule within in the anterior upper abdomen  LIMITED ULTRASOUND OF ABDOMINAL SOFT TISSUES  Technique: Ultrasound examination of the abdominal wall soft  tissues was performed in the area of clinical concern.  Comparison: None.  Findings:  There is an approximately 3.5 x 1.6 x 1.9 cm mass within the  subcutaneous tissues of the  anterior aspects of the left upper  abdomen which correlates with the patient's palpable area of  concern. This structure has smooth, lobulated margins and  demonstrates echotexture similar the adjacent background fat and is  favored to represent a lipoma.  IMPRESSION:  Probable lipoma correlates with the patient's palpable area of  concern.  Original Report Authenticated By: Waynard Reeds, M.D.    ASSESSMENT: 76 year old [place] woman   (1)  status post left lumpectomy and sentinel lymph node sampling 01/29/2011 for a T1BN0 (stage I) invasive lobular breast cancer, grade 2, estrogen receptor 100% positive, progesterone receptor 100% positive, with no HER-2 amplification, and then MIB-1 of 14%;   (2)  s/p radiation completed January 2013,   (3)  on letrozole as of February 2013   PLAN:  Tony will continue on the letrozole which I have refilled for her today. She'll continue to receive her zoledronic acid through Dr. Newell Coral office for her history of osteopenia.  I have reviewed this case and Dalynn's most recent lab results with Dr. Darnelle Catalan. We were a little concerned about the fact that and total bilirubin has gone up to 2.0, and we will have her return in a couple of weeks for repeat labs. These will be drawn after eating lunch in the afternoon, and will include a CBC with reticulocyte count, liver enzymes, haptoglobin, and LDH.  Otherwise, she'll see [place] again in 6 months and we will continue to follow her every 6 months for the first 2 years after which we will see her on a once a year basis. She will continue to have mammography yearly at [facility], next due in April of 2014. She voices understanding and agreement with this plan, and will call with any changes or problems.   Markeith Jue    01/23/2012

## 2012-01-23 NOTE — Patient Instructions (Signed)
Repeat labs in approx 2 weeks to evaluate elevated bilirubin.  Eat lunch before lab appointment.  Continue letrozole - refilled at [facility]  Return in 6 months for labs and follow up appointment (March 2014)

## 2012-01-24 LAB — VITAMIN D 25 HYDROXY (VIT D DEFICIENCY, FRACTURES): Vit D, 25-Hydroxy: 29 ng/mL — ABNORMAL LOW (ref 30–89)

## 2012-02-04 ENCOUNTER — Other Ambulatory Visit (HOSPITAL_BASED_OUTPATIENT_CLINIC_OR_DEPARTMENT_OTHER): Payer: Medicare Other | Admitting: Lab

## 2012-02-04 DIAGNOSIS — R17 Unspecified jaundice: Secondary | ICD-10-CM

## 2012-02-04 DIAGNOSIS — C50419 Malignant neoplasm of upper-outer quadrant of unspecified female breast: Secondary | ICD-10-CM

## 2012-02-04 LAB — CBC & DIFF AND RETIC
Basophils Absolute: 0 10*3/uL (ref 0.0–0.1)
EOS%: 3.3 % (ref 0.0–7.0)
Eosinophils Absolute: 0.2 10*3/uL (ref 0.0–0.5)
HCT: 42.5 % (ref 34.8–46.6)
HGB: 13.8 g/dL (ref 11.6–15.9)
Immature Retic Fract: 8.2 % (ref 1.60–10.00)
MCH: 31.4 pg (ref 25.1–34.0)
MCV: 96.6 fL (ref 79.5–101.0)
MONO%: 11.6 % (ref 0.0–14.0)
NEUT#: 3.7 10*3/uL (ref 1.5–6.5)
NEUT%: 65.7 % (ref 38.4–76.8)
Retic Ct Abs: 84.48 10*3/uL (ref 33.70–90.70)

## 2012-02-04 LAB — COMPREHENSIVE METABOLIC PANEL (CC13)
ALT: 19 U/L (ref 0–55)
AST: 17 U/L (ref 5–34)
Alkaline Phosphatase: 82 U/L (ref 40–150)
Creatinine: 0.9 mg/dL (ref 0.6–1.1)
Sodium: 141 mEq/L (ref 136–145)
Total Bilirubin: 1.7 mg/dL — ABNORMAL HIGH (ref 0.20–1.20)
Total Protein: 6.6 g/dL (ref 6.4–8.3)

## 2012-06-16 ENCOUNTER — Encounter: Payer: Self-pay | Admitting: Radiation Oncology

## 2012-06-16 DIAGNOSIS — Z923 Personal history of irradiation: Secondary | ICD-10-CM | POA: Insufficient documentation

## 2012-06-16 DIAGNOSIS — M1990 Unspecified osteoarthritis, unspecified site: Secondary | ICD-10-CM | POA: Insufficient documentation

## 2012-06-16 DIAGNOSIS — T7840XA Allergy, unspecified, initial encounter: Secondary | ICD-10-CM | POA: Insufficient documentation

## 2012-06-16 DIAGNOSIS — M722 Plantar fascial fibromatosis: Secondary | ICD-10-CM | POA: Insufficient documentation

## 2012-06-18 ENCOUNTER — Ambulatory Visit: Payer: Medicare Other | Admitting: Radiation Oncology

## 2012-06-18 ENCOUNTER — Telehealth: Payer: Self-pay | Admitting: *Deleted

## 2012-06-18 NOTE — Telephone Encounter (Signed)
CALLED PATIENT TO ALTER FU VISIT FOR 06-18-12 DUE TO [address] CONDITIONS, RESCHEDULED FOR 06-25-12, PER DR. REQUEST, SPOKE WITH PATIENT AND SHE IS AWARE OF THE APPT. CHANGES AND IS O.K. WITH THE APPT. CHANGE.

## 2012-06-25 ENCOUNTER — Encounter: Payer: Self-pay | Admitting: Radiation Oncology

## 2012-06-25 ENCOUNTER — Ambulatory Visit
Admission: RE | Admit: 2012-06-25 | Discharge: 2012-06-25 | Disposition: A | Discharge status: Home / Self Care | Payer: Medicare Other | Source: Ambulatory Visit | Attending: Radiation Oncology | Admitting: Radiation Oncology

## 2012-06-25 VITALS — BP 151/77 | HR 78 | Temp 97.4°F | Resp 20 | Wt 346.5 lb

## 2012-06-25 DIAGNOSIS — C50419 Malignant neoplasm of upper-outer quadrant of unspecified female breast: Secondary | ICD-10-CM

## 2012-06-25 NOTE — Progress Notes (Signed)
Department of Radiation Oncology  Phone:  [phone] Fax:        [phone]   Name: [name] MRN: [id number]  DOB: 1936-03-24  Date: 06/25/2012  Follow Up Visit Note  Diagnosis: T1 N0 left breast cancer  Summary and Interval since last radiation: 60.4 gray completed 05/20/2011  Interval History: [name] presents today for routine followup.  She is about a year out from her breast cancer treatment. She unfortunately continues to struggle with her arthritis problems. Her knee replacement has been put off by a right rotator cuff injury. Her abdominal swelling has improved. She thinks this is probably just some bruising related to her fall. She did have an ultrasound which confirmed the presence of a lipoma. She said these areas are similar in size to smaller. She is tolerating her Femara okay. She scheduled to see Dr. Jamey Ripa in the summer and Dr. Darnelle Catalan in March. She is due for mammograms in April. She has no breast related complaints.  Allergies:  Allergies  Allergen Reactions  . Codeine Nausea Only and Anxiety    Also complained of dizziness.  Has same problems with oxycodone and hydrocodone  . Contrast Media (Iodinated Diagnostic Agents) Nausea And Vomiting    IV dye for MRI  . Crestor (Rosuvastatin Calcium)     Severe liver function problems  . Lipitor (Atorvastatin Calcium)     Elevated liver function   . Quinine Derivatives Nausea Only    dizzy  . Sulfa Antibiotics Nausea Only    Also complained of dizziness  . Vicodin (Hydrocodone-Acetaminophen)     Itching all over  . Epinephrine     Patient becomes "shaky"  . Penicillins Rash    Pt reported all over body rash in 1958  . Theophyllines     Patient becomes "shaky"    Medications:  Current Outpatient Prescriptions  Medication Sig Dispense Refill  . albuterol (PROVENTIL HFA;VENTOLIN HFA) 108 (90 BASE) MCG/ACT inhaler Inhale 2 puffs into the lungs as needed.        Marland Kitchen alendronate (FOSAMAX) 70 MG tablet       .  allopurinol (ZYLOPRIM) 100 MG tablet Take 300 mg by mouth daily.       Marland Kitchen ALPRAZolam (XANAX) 0.5 MG tablet Take 0.5 mg by mouth 2 (two) times daily as needed.        Marland Kitchen atenolol (TENORMIN) 50 MG tablet Take 50 mg by mouth daily.        . Biotin 5000 MCG CAPS Take by mouth.      . colchicine (COLCRYS) 0.6 MG tablet Take 0.6 mg by mouth daily.      . ergocalciferol (VITAMIN D2) 50000 UNITS capsule Take 50,000 Units by mouth once a week.      . fexofenadine (ALLEGRA) 180 MG tablet Take 180 mg by mouth daily.        Marland Kitchen FLUoxetine (PROZAC) 20 MG capsule Take 20 mg by mouth daily.        . fluticasone (FLONASE) 50 MCG/ACT nasal spray       . Fluticasone-Salmeterol (ADVAIR) 500-50 MCG/DOSE AEPB Inhale 1 puff into the lungs every 12 (twelve) hours.        . hydrochlorothiazide 25 MG tablet Take 50 mg by mouth once.        Marland Kitchen KLOR-CON M20 20 MEQ tablet       . letrozole (FEMARA) 2.5 MG tablet Take 1 tablet (2.5 mg total) by mouth daily.  90 tablet  3  .  levothyroxine (SYNTHROID, LEVOTHROID) 50 MCG tablet Take 50 mcg by mouth daily.        . montelukast (SINGULAIR) 10 MG tablet Take 10 mg by mouth at bedtime.        . predniSONE (DELTASONE) 5 MG tablet       . Rivaroxaban (XARELTO) 20 MG TABS Take by mouth daily.        . traMADol (ULTRAM) 50 MG tablet        No current facility-administered medications for this encounter.    Physical Exam:  Filed Vitals:   06/25/12 1553  BP: 151/77  Pulse: 78  Temp: 97.4 F (36.3 C)  Resp: 20   she is an obese female sitting in a wheelchair. I am able to examine her in the wheelchair. She has no palpable axillary adenopathy bilaterally. My breast exam is somewhat limited but she has no palpable abnormalities. The scar in the outer aspect of her left breast is well-healed. There is some minimal scar tissue palpable below this.  IMPRESSION: [name] is a 77 y.o. female status post breast conservation for left breast cancer with no evidence of disease  PLAN:  And looks  great. She is tolerating her Femara well. Her biggest issues continue to be orthopedic in nature. I have not scheduled followup with her. I be happy to see her back on a when necessary basis.    Lurline Hare, MD

## 2012-06-25 NOTE — Progress Notes (Signed)
Pt had injections in right shoulder, both knees yesterday. She states she "is jittery a few days afterwards". She has right shoulder pain today. Ultram prn. Pt denies problems w/left breast. Mammogram scheduled for  08/04/12.

## 2012-07-15 ENCOUNTER — Other Ambulatory Visit: Payer: Self-pay | Admitting: *Deleted

## 2012-07-15 ENCOUNTER — Other Ambulatory Visit (HOSPITAL_BASED_OUTPATIENT_CLINIC_OR_DEPARTMENT_OTHER): Payer: Medicare Other | Admitting: Lab

## 2012-07-15 DIAGNOSIS — E559 Vitamin D deficiency, unspecified: Secondary | ICD-10-CM

## 2012-07-15 DIAGNOSIS — C50419 Malignant neoplasm of upper-outer quadrant of unspecified female breast: Secondary | ICD-10-CM

## 2012-07-15 LAB — CBC WITH DIFFERENTIAL/PLATELET
Basophils Absolute: 0 10*3/uL (ref 0.0–0.1)
Eosinophils Absolute: 0.2 10*3/uL (ref 0.0–0.5)
HCT: 40.2 % (ref 34.8–46.6)
HGB: 13.4 g/dL (ref 11.6–15.9)
LYMPH%: 15.7 % (ref 14.0–49.7)
MCV: 95.6 fL (ref 79.5–101.0)
MONO#: 0.7 10*3/uL (ref 0.1–0.9)
MONO%: 11 % (ref 0.0–14.0)
NEUT#: 4.2 10*3/uL (ref 1.5–6.5)
NEUT%: 69.2 % (ref 38.4–76.8)
Platelets: 146 10*3/uL (ref 145–400)
RBC: 4.2 10*6/uL (ref 3.70–5.45)
WBC: 6 10*3/uL (ref 3.9–10.3)

## 2012-07-15 LAB — COMPREHENSIVE METABOLIC PANEL (CC13)
Albumin: 3.4 g/dL — ABNORMAL LOW (ref 3.5–5.0)
Alkaline Phosphatase: 85 U/L (ref 40–150)
BUN: 15.5 mg/dL (ref 7.0–26.0)
CO2: 27 mEq/L (ref 22–29)
Glucose: 117 mg/dl — ABNORMAL HIGH (ref 70–99)
Sodium: 141 mEq/L (ref 136–145)
Total Bilirubin: 1.88 mg/dL — ABNORMAL HIGH (ref 0.20–1.20)
Total Protein: 6.7 g/dL (ref 6.4–8.3)

## 2012-07-16 LAB — VITAMIN D 25 HYDROXY (VIT D DEFICIENCY, FRACTURES): Vit D, 25-Hydroxy: 33 ng/mL (ref 30–89)

## 2012-07-22 ENCOUNTER — Ambulatory Visit: Payer: Medicare Other | Admitting: Oncology

## 2012-07-22 ENCOUNTER — Telehealth: Payer: Self-pay | Admitting: Oncology

## 2012-07-22 NOTE — Telephone Encounter (Signed)
Pt called today to see if she could still come in for appt. 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 Per GM due to call appt will need to be r/s. gv pt new appt for 3/28 @ 10:30am. D/t per GM. Pt made aware she should arrive @ 10am.

## 2012-07-22 NOTE — Progress Notes (Signed)
Pt forget time of appt; we rescheduled for 3/28 at 10:15 AM

## 2012-07-31 ENCOUNTER — Ambulatory Visit (HOSPITAL_BASED_OUTPATIENT_CLINIC_OR_DEPARTMENT_OTHER): Payer: Medicare Other | Admitting: Oncology

## 2012-07-31 ENCOUNTER — Telehealth: Payer: Self-pay | Admitting: Oncology

## 2012-07-31 VITALS — BP 150/76 | HR 57 | Temp 97.8°F | Resp 20 | Ht 67.0 in | Wt 335.0 lb

## 2012-07-31 DIAGNOSIS — C50919 Malignant neoplasm of unspecified site of unspecified female breast: Secondary | ICD-10-CM

## 2012-07-31 DIAGNOSIS — Z170 Estrogen receptor positive status [ER+]: Secondary | ICD-10-CM

## 2012-07-31 DIAGNOSIS — C50419 Malignant neoplasm of upper-outer quadrant of unspecified female breast: Secondary | ICD-10-CM

## 2012-07-31 MED ORDER — LETROZOLE 2.5 MG PO TABS: 2.5000 mg | ORAL_TABLET | Freq: Every day | ORAL | Status: DC

## 2012-07-31 NOTE — Telephone Encounter (Signed)
gv pt appt schedule for October 2014 and May 2015.

## 2012-07-31 NOTE — Progress Notes (Signed)
ID: [name]   DOB: [birth year]  MR#: [id number]  CSN#:[id number]  PCP: Mercedes Bos, MD GYN: SU: OTHER MD: [name]  HISTORY OF PRESENT ILLNESS: Ms. [name] had an unremarkable screening mammogram August of 2011.  On 12/18/2010 however a potential abnormality was noted in the left breast and she was recalled for additional views 08/16.  Dr. Tilda [name] was able to demonstrate a focal irregular density in the upper outer quadrant of the left breast associated with linear microcalcifications.  By ultrasound a 9 mm hypoechoic mass was noted at the corresponding area.  This warranted biopsy and a biopsy was performed under ultrasound guidance 08/21.  The pathology showed ([id number]) an invasive lobular carcinoma, E cadherin negative, associated with ductal carcinoma in situ.  The invasive lobular carcinoma was HER-2 negative with a ratio by CISH of 1.23.  Estrogen receptor was 100% positive, progesterone receptor was 100% positive and the proliferation marker was 14%.  With this information the patient was referred to Dr. Jamey [name] and bilateral breast MRIs were obtained 12/28/2010.  In the upper outer quadrant of the left breast there was a small irregular nodule measuring 1.2 cm maximally.  There were no other suspicious abnormalities in the left breast or in the right breast.  There were no suspicious internal mammary or axillary lymph nodes identified.  With this information the patient is referred for further evaluation and treatment.  INTERVAL HISTORY: [name] returns today accompanied by her husband, [name], for followup of her breast cancer. Since her last visit here she has developed further problems with her knees and shoulders, which are being closely followed by [name]  REVIEW OF SYSTEMS: She has had some precancerous patches frozen off by Dr. Terri [name]. She was losing hair "by the handful", but is now doing better taking Biotin. She is tolerating the letrozole with no side  effects that she is aware of. Otherwise a detailed review of systems today was entirely [name]  PAST MEDICAL HISTORY: Past Medical History  Diagnosis Date  . Anxiety   . Allergy   . Asthma   . 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 Incontinence of urine   . Plantar fasciitis   . Breast cancer (ILC) Receptor + her2 _ 12/26/2010    left  . Gout     post operative  . Atrial fibrillation 01/21/11  . Malignant neoplasm of upper-outer quadrant of female breast 12/26/2010  . Hx of radiation therapy 04/02/11 -05/20/11    left breast  The past medical history is significant for hypertension, asthma, hypothyroidism, osteoarthritis/degenerative disk disease with particular problems with her right knee, history of depression, history of paresthesias, hypercholesterolemia, allergic rhinitis, osteopenia, status post tonsillectomy, status post cholecystectomy, status post right hand surgery, history of fractures to the arms and legs, history of coronary artery disease status post stenting of her right coronary with a good cath report in 2001.  She is status post D and C and cryosurgery.  PAST SURGICAL HISTORY: Past Surgical History  Procedure Laterality Date  . Tonsillectomy  1955  . Cholecystectomy  1955  . Hand surgery  1992    tendons between thumb and forefinger  . Coronary angioplasty with stent placement  2000  . Skin biopsy      1994, 2006, 2008, 2010, 2011, 2011, 2012-  pre-cancerous  . Dilation and curettage of uterus  1976    and 1996  . Breast lumpectomy w/ needle localization  01/29/2011    Left with  SLN Dr [name]    FAMILY HISTORY Family History  Problem Relation Age of Onset  . Heart disease Father   . Cancer Father     intestinal polyps  . Asthma Sister   . Heart disease Brother   The patient's father had surgery for cancerous polyps in the colon at age 25.  He died at age 50 from a heart attack.  The patient's mother died at age 45.  The patient has 1 brother alive at age  67 and in good health.  She has 1 sister also in good health.  There is no family history of breast or ovarian cancer.  GYNECOLOGIC HISTORY: She had menarche at age 62, last period in 11.  She took hormone replacement for less than a year, stopping in 1996.  She carried 3 children to term, the first one at age 65.  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  She is also an active Teaching laboratory technician.  She is now retired.  Her husband, [name], is present today as are her daughters, [name] and [name].  [name] is 9, lives here in [place] and is a wound treatment specialist.  [name] also lives in [place] and is an Airline pilot.  Son, [name], died at the age of 39 from what seems to have been a heart attack.  The patient has 2 grandchildren.   ADVANCED DIRECTIVES: In place  HEALTH MAINTENANCE: History  Substance Use Topics  . Smoking status: Former Smoker -- 1.00 packs/day    Quit date: 12/05/1962  . Smokeless tobacco: Never Used  . Alcohol Use: No     Colonoscopy:  PAP:  Bone density:  Lipid panel:  Allergies  Allergen Reactions  . Codeine Nausea Only and Anxiety    Also complained of dizziness.  Has same problems with oxycodone and hydrocodone  . Contrast Media (Iodinated Diagnostic Agents) Nausea And Vomiting    IV dye for MRI  . Crestor (Rosuvastatin Calcium)     Severe liver function problems  . Lipitor (Atorvastatin Calcium)     Elevated liver function   . Quinine Derivatives Nausea Only    dizzy  . Sulfa Antibiotics Nausea Only    Also complained of dizziness  . Vicodin (Hydrocodone-Acetaminophen)     Itching all over  . Epinephrine     Patient becomes "shaky"  . Penicillins Rash    Pt reported all over body rash in 1958  . Theophyllines     Patient becomes "shaky"    Current Outpatient Prescriptions  Medication Sig Dispense Refill  . albuterol (PROVENTIL HFA;VENTOLIN  HFA) 108 (90 BASE) MCG/ACT inhaler Inhale 2 puffs into the lungs as needed.        Marland Kitchen alendronate (FOSAMAX) 70 MG tablet       . allopurinol (ZYLOPRIM) 100 MG tablet Take 300 mg by mouth daily.       Marland Kitchen ALPRAZolam (XANAX) 0.5 MG tablet Take 0.5 mg by mouth 2 (two) times daily as needed.        Marland Kitchen atenolol (TENORMIN) 50 MG tablet Take 50 mg by mouth daily.        . Biotin 5000 MCG CAPS Take by mouth.      . colchicine (COLCRYS) 0.6 MG tablet Take 0.6 mg by mouth daily.      . ergocalciferol (VITAMIN D2) 50000 UNITS capsule Take 50,000 Units by mouth once a week.      Marland Kitchen  fexofenadine (ALLEGRA) 180 MG tablet Take 180 mg by mouth daily.        Marland Kitchen FLUoxetine (PROZAC) 20 MG capsule Take 20 mg by mouth daily.        . fluticasone (FLONASE) 50 MCG/ACT nasal spray       . Fluticasone-Salmeterol (ADVAIR) 500-50 MCG/DOSE AEPB Inhale 1 puff into the lungs every 12 (twelve) hours.        . hydrochlorothiazide 25 MG tablet Take 50 mg by mouth once.        Marland Kitchen KLOR-CON M20 20 MEQ tablet       . letrozole (FEMARA) 2.5 MG tablet Take 1 tablet (2.5 mg total) by mouth daily.  90 tablet  3  . levothyroxine (SYNTHROID, LEVOTHROID) 50 MCG tablet Take 50 mcg by mouth daily.        . montelukast (SINGULAIR) 10 MG tablet Take 10 mg by mouth at bedtime.        . predniSONE (DELTASONE) 5 MG tablet       . Rivaroxaban (XARELTO) 20 MG TABS Take by mouth daily.        . traMADol (ULTRAM) 50 MG tablet        No current facility-administered medications for this visit.    OBJECTIVE: Elderly white woman examined in a wheelchair Filed Vitals:   07/31/12 1015  BP: 150/76  Pulse: 57  Temp: 97.8 F (36.6 C)  Resp: 20     Body mass index is 52.46 kg/(m^2).    ECOG FS: 2  Sclerae unicteric Oropharynx clear No peripheral adenopathy Lungs no rales or rhonchi Heart regular rate and rhythm Abd obese, benign, soft,  positive bowel sounds MSK  scoliosis but no focal spinal tenderness  Neuro: nonfocal, well oriented, pleasant  affect Breasts: Right breast is unremarkable. Left breast is status post lumpectomy and radiation. No evidence of local recurrence. The right axilla is benign  LAB RESULTS: Lab Results  Component Value Date   WBC 6.0 07/15/2012   NEUTROABS 4.2 07/15/2012   HGB 13.4 07/15/2012   HCT 40.2 07/15/2012   MCV 95.6 07/15/2012   PLT 146 07/15/2012      Chemistry      Component Value Date/Time   NA 141 07/15/2012 1409   NA 141 07/22/2011 0957   K 3.3* 07/15/2012 1409   K 3.6 07/22/2011 0957   CL 105 07/15/2012 1409   CL 104 07/22/2011 0957   CO2 27 07/15/2012 1409   CO2 25 07/22/2011 0957   BUN 15.5 07/15/2012 1409   BUN 15 07/22/2011 0957   CREATININE 0.9 07/15/2012 1409   CREATININE 0.87 07/22/2011 0957      Component Value Date/Time   CALCIUM 9.8 07/15/2012 1409   CALCIUM 9.5 07/22/2011 0957   ALKPHOS 85 07/15/2012 1409   ALKPHOS 69 07/22/2011 0957   AST 19 07/15/2012 1409   AST 18 07/22/2011 0957   ALT 18 07/15/2012 1409   ALT 17 07/22/2011 0957   BILITOT 1.88* 07/15/2012 1409   BILITOT 1.3* 07/22/2011 0957       Lab Results  Component Value Date   LABCA2 17 07/15/2012     STUDIES: No results found.  ASSESSMENT: 77 y.o.  [place] woman   (1)  status post left lumpectomy and sentinel lymph node sampling 01/29/2011 for a T1BN0 (stage I) invasive lobular breast cancer, grade 2, estrogen receptor 100% positive, progesterone receptor 100% positive, with no HER-2 amplification, and then MIB-1 of 14%;   (2)  s/p radiation completed  January 2013,   (3)  on letrozole as of February 2013   PLAN:  I gave her a copy of her total bili Rubin's, which have been slightly elevated, but with no trend. I gave her a new prescription for letrozole, and the plan is to continue this medication for a total of 5 years. She is a ready on bisphosphonates through Dr. Earl Gala. Graylyn is due for repeat mammography in April of this year. She is going to see [place] again in 6 months. When she sees me again in may of 2015  we will initiate yearly followup.  Rashon Rezek C    07/31/2012

## 2012-10-09 ENCOUNTER — Encounter: Payer: Self-pay | Admitting: Oncology

## 2012-10-14 ENCOUNTER — Telehealth (INDEPENDENT_AMBULATORY_CARE_PROVIDER_SITE_OTHER): Payer: Self-pay | Admitting: General Surgery

## 2012-10-14 NOTE — Telephone Encounter (Signed)
Called patient. She states she has had a swelling/tightness that comes and goes in her left breast for months. No swelling in her arm. No pain. No redness. Incision is fine. She states the breast has had a tightness that hasn't gone away in a week. She had her mammogram 09/03/2012 which she states was fine. I told her I would make Dr Jamey Ripa aware and call her back with any suggestions.

## 2012-10-14 NOTE — Telephone Encounter (Signed)
Message copied by Liliana Cline on Wed Oct 14, 2012  2:40 PM ------      Message from: Marin Shutter      Created: Wed Oct 14, 2012  1:28 PM      Regarding: Dr. Tomasita Crumble: [phone]       LTFU/Aug. Called pt, and she would like you to call her.  She had 2 lymphnodes removed, and she has some swelling.  She says it's uncomfortable, but not painful.  She is scheduled to come in in Aug. Thx ------

## 2012-10-16 NOTE — Telephone Encounter (Signed)
Tried to call patient to offer sooner appt. No answer. No machine to leave message.

## 2012-10-16 NOTE — Telephone Encounter (Signed)
I can't ell from symptoms - Will plan to get in prior to her August appointment

## 2012-10-16 NOTE — Telephone Encounter (Signed)
Appt moved sooner with patient to 11/05/2012. She states her breast is better now.

## 2012-11-05 ENCOUNTER — Encounter (INDEPENDENT_AMBULATORY_CARE_PROVIDER_SITE_OTHER): Payer: Self-pay | Admitting: Surgery

## 2012-11-05 ENCOUNTER — Ambulatory Visit (INDEPENDENT_AMBULATORY_CARE_PROVIDER_SITE_OTHER): Payer: Medicare Other | Admitting: Surgery

## 2012-11-05 VITALS — BP 130/82 | HR 78 | Temp 98.0°F | Resp 14 | Ht 65.0 in | Wt 311.4 lb

## 2012-11-05 DIAGNOSIS — C50419 Malignant neoplasm of upper-outer quadrant of unspecified female breast: Secondary | ICD-10-CM

## 2012-11-05 DIAGNOSIS — C50412 Malignant neoplasm of upper-outer quadrant of left female breast: Secondary | ICD-10-CM

## 2012-11-05 DIAGNOSIS — Z853 Personal history of malignant neoplasm of breast: Secondary | ICD-10-CM

## 2012-11-05 NOTE — Patient Instructions (Signed)
Continue annual mammograms and follow ups 

## 2012-11-05 NOTE — Progress Notes (Signed)
NAME: [name]       DOB: 1935/09/20           DATE: 11/05/2012       MRN: [id number]   [name] is a 77 y.o.Marland Kitchenfemale who presents for routine followup of her Left breast cancer, IDC, Stage I, LOQ, diagnosed in 2012 and treated with lumpectomy and radiation. She notes some intermittent edema of the breast, none today PFSH: She has had no significant changes since the last visit here.  ROS: There have been no significant changes since the last visit here  EXAM:  VS: BP 130/82  Pulse 78  Temp(Src) 98 F (36.7 C) (Temporal)  Resp 14  Ht 5\' 5"  (1.651 m)  Wt 311 lb 6.4 oz (141.25 kg)  BMI 51.82 kg/m2   General: The patient is alert, oriented, generally healthy appearing, NAD. Mood and affect are normal.  Breasts:  Right breast is wnl. Left shows minimal changes of radiation, not tender no suspicious areas  Lymphatics: She has no axillary or supraclavicular adenopathy on either side.  Extremities: Full ROM of the surgical side with no lymphedema noted.   Data Reviewed: Mammo negative at [facility]  Impression: Doing well, with no evidence of recurrent cancer or new cancer  Plan: Will continue to follow up on an annual basis here.She would like to see Dr Derrell Lolling after I retire

## 2012-12-07 ENCOUNTER — Ambulatory Visit (INDEPENDENT_AMBULATORY_CARE_PROVIDER_SITE_OTHER): Payer: Medicare Other | Admitting: Surgery

## 2013-01-23 ENCOUNTER — Other Ambulatory Visit: Payer: Self-pay | Admitting: Interventional Cardiology

## 2013-01-23 DIAGNOSIS — E782 Mixed hyperlipidemia: Secondary | ICD-10-CM

## 2013-01-23 DIAGNOSIS — Z79899 Other long term (current) drug therapy: Secondary | ICD-10-CM

## 2013-02-09 ENCOUNTER — Telehealth: Payer: Self-pay

## 2013-02-09 NOTE — Telephone Encounter (Signed)
New problem    Upcoming eye surgery . Please advise if xarelto should be stop and  For how long.

## 2013-02-09 NOTE — Telephone Encounter (Signed)
called pt to let her know to check with her eye doctor to see if anticoagulation needs to be stopped. Pt states she will see her eye doctor next week and if she an ok to stop Xarelto she will call back, otherwise she will stay on xarelto for eye surgery.

## 2013-02-09 NOTE — Telephone Encounter (Signed)
WOuld check with eye surgeon if anticoagulation needs to be stopped.

## 2013-02-11 ENCOUNTER — Other Ambulatory Visit (HOSPITAL_BASED_OUTPATIENT_CLINIC_OR_DEPARTMENT_OTHER): Payer: Medicare Other | Admitting: Lab

## 2013-02-11 DIAGNOSIS — C50419 Malignant neoplasm of upper-outer quadrant of unspecified female breast: Secondary | ICD-10-CM

## 2013-02-11 LAB — CBC WITH DIFFERENTIAL/PLATELET
Basophils Absolute: 0.1 10*3/uL (ref 0.0–0.1)
EOS%: 2.6 % (ref 0.0–7.0)
Eosinophils Absolute: 0.2 10*3/uL (ref 0.0–0.5)
HGB: 13.9 g/dL (ref 11.6–15.9)
LYMPH%: 20.6 % (ref 14.0–49.7)
MCV: 97.5 fL (ref 79.5–101.0)
MONO#: 0.5 10*3/uL (ref 0.1–0.9)
MONO%: 8.4 % (ref 0.0–14.0)
NEUT#: 4.2 10*3/uL (ref 1.5–6.5)
Platelets: 145 10*3/uL (ref 145–400)
RBC: 4.28 10*6/uL (ref 3.70–5.45)
WBC: 6.2 10*3/uL (ref 3.9–10.3)
lymph#: 1.3 10*3/uL (ref 0.9–3.3)

## 2013-02-11 LAB — COMPREHENSIVE METABOLIC PANEL (CC13)
ALT: 18 U/L (ref 0–55)
AST: 19 U/L (ref 5–34)
Albumin: 3.6 g/dL (ref 3.5–5.0)
Alkaline Phosphatase: 65 U/L (ref 40–150)
Anion Gap: 10 mEq/L (ref 3–11)
BUN: 12.5 mg/dL (ref 7.0–26.0)
CO2: 27 mEq/L (ref 22–29)
Glucose: 111 mg/dl (ref 70–140)
Potassium: 3.4 mEq/L — ABNORMAL LOW (ref 3.5–5.1)
Sodium: 144 mEq/L (ref 136–145)
Total Bilirubin: 2.19 mg/dL — ABNORMAL HIGH (ref 0.20–1.20)
Total Protein: 6.9 g/dL (ref 6.4–8.3)

## 2013-02-18 ENCOUNTER — Telehealth: Payer: Self-pay | Admitting: *Deleted

## 2013-02-18 ENCOUNTER — Ambulatory Visit (HOSPITAL_BASED_OUTPATIENT_CLINIC_OR_DEPARTMENT_OTHER): Payer: Medicare Other | Admitting: Physician Assistant

## 2013-02-18 ENCOUNTER — Encounter (INDEPENDENT_AMBULATORY_CARE_PROVIDER_SITE_OTHER): Payer: Self-pay

## 2013-02-18 ENCOUNTER — Encounter: Payer: Self-pay | Admitting: Physician Assistant

## 2013-02-18 VITALS — BP 121/71 | HR 65 | Temp 98.4°F | Resp 20 | Ht 65.0 in | Wt 303.7 lb

## 2013-02-18 DIAGNOSIS — R590 Localized enlarged lymph nodes: Secondary | ICD-10-CM

## 2013-02-18 DIAGNOSIS — Z170 Estrogen receptor positive status [ER+]: Secondary | ICD-10-CM

## 2013-02-18 DIAGNOSIS — Z853 Personal history of malignant neoplasm of breast: Secondary | ICD-10-CM

## 2013-02-18 DIAGNOSIS — C50912 Malignant neoplasm of unspecified site of left female breast: Secondary | ICD-10-CM

## 2013-02-18 DIAGNOSIS — C50412 Malignant neoplasm of upper-outer quadrant of left female breast: Secondary | ICD-10-CM

## 2013-02-18 DIAGNOSIS — R599 Enlarged lymph nodes, unspecified: Secondary | ICD-10-CM

## 2013-02-18 DIAGNOSIS — C50419 Malignant neoplasm of upper-outer quadrant of unspecified female breast: Secondary | ICD-10-CM

## 2013-02-18 DIAGNOSIS — E876 Hypokalemia: Secondary | ICD-10-CM

## 2013-02-18 DIAGNOSIS — R17 Unspecified jaundice: Secondary | ICD-10-CM | POA: Insufficient documentation

## 2013-02-18 MED ORDER — LETROZOLE 2.5 MG PO TABS: 2.5000 mg | ORAL_TABLET | Freq: Every day | ORAL | Status: DC

## 2013-02-18 NOTE — Progress Notes (Signed)
ID: [name]   DOB: [birth year]  MR#: [id number]  WUJ#:[id number]  PCP: Mercedes Bos, MD GYN: SU: [name]/[name] OTHER MD: [name], [name], [name]  CHIEF COMPLAINT:  Left Breast Cancer   HISTORY OF PRESENT ILLNESS: [name] had an unremarkable screening mammogram August of 2011.  On 12/18/2010 however a potential abnormality was noted in the left breast and she was recalled for additional views 08/16.  Dr. Tilda [name] was able to demonstrate a focal irregular density in the upper outer quadrant of the left breast associated with linear microcalcifications.  By ultrasound a 9 mm hypoechoic mass was noted at the corresponding area.  This warranted biopsy and a biopsy was performed under ultrasound guidance 08/21.  The pathology showed ([id number]) an invasive lobular carcinoma, E cadherin negative, associated with ductal carcinoma in situ.  The invasive lobular carcinoma was HER-2 negative with a ratio by CISH of 1.23.  Estrogen receptor was 100% positive, progesterone receptor was 100% positive and the proliferation marker was 14%.  With this information the patient was referred to Dr. Jamey [name] and bilateral breast MRIs were obtained 12/28/2010.  In the upper outer quadrant of the left breast there was a small irregular nodule measuring 1.2 cm maximally.  There were no other suspicious abnormalities in the left breast or in the right breast.  There were no suspicious internal mammary or axillary lymph nodes identified.  With this information the patient is referred for further evaluation and treatment.  Further treatment is as detailed below.  INTERVAL HISTORY: [name] returns today accompanied by her husband, [name], for followup of her breast cancer. She continues on letrozole which she is tolerating extremely well. She has no significant hot flashes, and denies vaginal dryness. She has chronic joint pain, but this has not worsened or changed since  starting on the aromatase inhibitor.   In fact, [name] tells me that she has felt better over the past few months than she has "in her entire life". Her energy level has improved. She's been watching her diet very closely, and has lost approximately 50 pounds since February of this year. Looking back at her records, her BMI has decreased from 52.6 in September 2013 to 50.6 today.  She is being followed very closely by her primary care physician, Dr. Earl [name], as well as her cardiologist, Dr. Eldridge [name]. She's recently started on a very low dose of Crestor, 10 mg twice weekly (she has a history of very poor tolerance of the statin drugs), and was also started on multiple supplements including vitamin C, vitamin D, and Co Q10.   Thus far, she is having no problems with this low dose of Crestor, and her liver enzymes have been normal. Of course she does have a history of elevated total bilirubin and this was also noted on her recent labs. There has been, however, no trend upward or downward over the last several months.   Her only other concern today is the fact that she palpates what she thinks is a lymph node in the posterior cervical chain on the left. This has been there now for several weeks, with no obvious change. She denies, once again, any recent illnesses or infections.   REVIEW OF SYSTEMS: [name] has had no recent illnesses and denies fevers or chills. She does tell me that she has had approximately 3 urinary tract infections over the past year and is considering additional evaluation with a urologist. She's had no rashes, but has some mildly  hyperpigmented patches on her face which occurred after sun exposure 3 weeks ago. She continues taking Biotin, and her hair is thicker. Her skin is no longer dry. Her energy level is better. She's had no nausea or change in bowel or bladder habits. She has a history of asthma and has some wheezing, but denies any increased cough, phlegm production, or increased  shortness of breath. She's had no chest pain or palpitations. She does have a history of mild A. fib which is of course followed by her cardiologist. [name] denies any recent headaches, dizziness, or change in vision.   Otherwise a detailed review of systems today was entirely stable  PAST MEDICAL HISTORY: Past Medical History  Diagnosis Date  . Anxiety   . Allergy   . Asthma   . 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 Incontinence of urine   . Plantar fasciitis   . Breast cancer ([facility]) Receptor + her2 _ 12/26/2010    left  . Gout     post operative  . Atrial fibrillation 01/21/11  . Malignant neoplasm of upper-outer quadrant of female breast 12/26/2010  . Hx of radiation therapy 04/02/11 -05/20/11    left breast  The past medical history is significant for hypertension, asthma, hypothyroidism, osteoarthritis/degenerative disk disease with particular problems with her right knee, history of depression, history of paresthesias, hypercholesterolemia, allergic rhinitis, osteopenia, status post tonsillectomy, status post cholecystectomy, status post right hand surgery, history of fractures to the arms and legs, history of coronary artery disease status post stenting of her right coronary with a good cath report in 2001.  She is status post D and C and cryosurgery.  PAST SURGICAL HISTORY: Past Surgical History  Procedure Laterality Date  . Tonsillectomy  1955  . Cholecystectomy  1955  . Hand surgery  1992    tendons between thumb and forefinger  . Coronary angioplasty with stent placement  2000  . Skin biopsy      1994, 2006, 2008, 2010, 2011, 2011, 2012-  pre-cancerous  . Dilation and curettage of uterus  1976    and 1996  . Breast lumpectomy w/ needle localization  01/29/2011    Left with [facility] Dr [name]    FAMILY HISTORY Family History  Problem Relation Age of Onset  . Heart disease Father   . Cancer Father     intestinal polyps  . Asthma Sister   . Heart disease Brother   The  patient's father had surgery for cancerous polyps in the colon at age 66.  He died at age 66 from a heart attack.  The patient's mother died at age 44.  The patient has 1 brother alive at age 93 and in good health.  She has 1 sister also in good health.  There is no family history of breast or ovarian cancer.  GYNECOLOGIC HISTORY: She had menarche at age 52, last period in 48.  She took hormone replacement for less than a year, stopping in 1996.  She carried 3 children to term, the first one at age 37.  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  She is also an active Teaching laboratory technician.  She is now retired.  Her husband, Lilyana Lippman, is present today as are her daughters, Hantz and [name].  Aesha Agrawal is 74, lives here in [place] and is a wound treatment specialist.  Mariea Stable also lives in [place] and  is an Airline pilot.  Son, Blanch Stang, died at the age of 54 from what seems to have been a heart attack.  The patient has 2 grandchildren.   ADVANCED DIRECTIVES: In place  HEALTH MAINTENANCE: (Updated 02/18/2013) History  Substance Use Topics  . Smoking status: Former Smoker -- 1.00 packs/day    Quit date: 12/05/1962  . Smokeless tobacco: Never Used  . Alcohol Use: No     Colonoscopy:  PAP:  Bone density: Not on file  Lipid panel: Sept 2014, Dr. Eldridge [name]    Allergies  Allergen Reactions  . Codeine Nausea Only and Anxiety    Also complained of dizziness.  Has same problems with oxycodone and hydrocodone  . Contrast Media [Iodinated Diagnostic Agents] Nausea And Vomiting    IV dye for MRI  . Crestor [Rosuvastatin Calcium]     Severe liver function problems  . Lipitor [Atorvastatin Calcium]     Elevated liver function   . Quinine Derivatives Nausea Only    dizzy  . Sulfa Antibiotics Nausea Only    Also complained of dizziness  . Vicodin [Hydrocodone-Acetaminophen]     Itching all over  .  Epinephrine     Patient becomes "shaky"  . Penicillins Rash    Pt reported all over body rash in 1958  . Theophyllines     Patient becomes "shaky"    Current Outpatient Prescriptions  Medication Sig Dispense Refill  . albuterol (PROVENTIL HFA;VENTOLIN HFA) 108 (90 BASE) MCG/ACT inhaler Inhale 2 puffs into the lungs as needed.        Marland Kitchen alendronate (FOSAMAX) 70 MG tablet       . allopurinol (ZYLOPRIM) 100 MG tablet Take 300 mg by mouth daily.       Marland Kitchen ALPRAZolam (XANAX) 0.5 MG tablet Take 0.5 mg by mouth 2 (two) times daily as needed.        Marland Kitchen atenolol (TENORMIN) 50 MG tablet Take 50 mg by mouth daily.        . Biotin 5000 MCG CAPS Take by mouth.      . cholecalciferol (VITAMIN D) 1000 UNITS tablet Take 1,000 Units by mouth daily.      Marland Kitchen co-enzyme Q-10 50 MG capsule Take 400 mg by mouth daily.      . colchicine (COLCRYS) 0.6 MG tablet Take 0.6 mg by mouth daily.      . ergocalciferol (VITAMIN D2) 50000 UNITS capsule Take 50,000 Units by mouth once a week.      . fexofenadine (ALLEGRA) 180 MG tablet Take 180 mg by mouth daily.        Marland Kitchen FLUoxetine (PROZAC) 20 MG capsule Take 20 mg by mouth daily.        . fluticasone (FLONASE) 50 MCG/ACT nasal spray       . Fluticasone-Salmeterol (ADVAIR) 500-50 MCG/DOSE AEPB Inhale 1 puff into the lungs every 12 (twelve) hours.        . hydrochlorothiazide 25 MG tablet Take 50 mg by mouth once.        Marland Kitchen KLOR-CON M20 20 MEQ tablet       . letrozole (FEMARA) 2.5 MG tablet Take 1 tablet (2.5 mg total) by mouth daily.  90 tablet  3  . levothyroxine (SYNTHROID, LEVOTHROID) 50 MCG tablet Take 50 mcg by mouth daily.        . montelukast (SINGULAIR) 10 MG tablet Take 10 mg by mouth at bedtime.        . predniSONE (DELTASONE) 5 MG tablet       .  Rivaroxaban (XARELTO) 20 MG TABS Take by mouth daily.        . rosuvastatin (CRESTOR) 10 MG tablet Take 10 mg by mouth. As of 02/18/2013, taking 2 x weekly      . traMADol (ULTRAM) 50 MG tablet       . vitamin C (ASCORBIC  ACID) 500 MG tablet Take 500 mg by mouth daily.       No current facility-administered medications for this visit.    OBJECTIVE: Elderly white woman examined in a wheelchair Filed Vitals:   02/18/13 1324  BP: 121/71  Pulse: 65  Temp: 98.4 F (36.9 C)  Resp: 20     Body mass index is 50.54 kg/(m^2).    ECOG FS: 2 Filed Weights   02/18/13 1324  Weight: 303 lb 11.2 oz (137.757 kg)   Physical Exam: HEENT:  Sclerae anicteric.  Oropharynx clear. Buccal mucosa is pink and moist NODES:  There is a small palpable mass measuring approximately 1 cm in diameter, soft and freely movable, in the left posterior cervical chain, possibly an enlarged lymph node. There is no cervical lymphadenopathy on the right. No supraclavicular adenopathy. BREAST EXAM:  Breasts are large and pendulous bilaterally. Right breast is unremarkable. Left breast is status post lumpectomy, with no evidence of local recurrence. Axillae are benign bilaterally with no palpable adenopathy. LUNGS:  Good excursion bilaterally. Diffuse expiratory wheezes bilaterally. No rhonchi auscultated. And HEART:  Regular rate, irregular rhythm.  ABDOMEN:  Soft, obese, nontender.  Positive bowel sounds.  MSK:  No focal spinal tenderness to palpation.  EXTREMITIES:  No peripheral edema.   NEURO:  Nonfocal. Well oriented.  Positive affect.    LAB RESULTS: Lab Results  Component Value Date   WBC 6.2 02/11/2013   NEUTROABS 4.2 02/11/2013   HGB 13.9 02/11/2013   HCT 41.8 02/11/2013   MCV 97.5 02/11/2013   PLT 145 02/11/2013      Chemistry      Component Value Date/Time   NA 144 02/11/2013 1310   NA 141 07/22/2011 0957   K 3.4* 02/11/2013 1310   K 3.6 07/22/2011 0957   CL 105 07/15/2012 1409   CL 104 07/22/2011 0957   CO2 27 02/11/2013 1310   CO2 25 07/22/2011 0957   BUN 12.5 02/11/2013 1310   BUN 15 07/22/2011 0957   CREATININE 0.8 02/11/2013 1310   CREATININE 0.87 07/22/2011 0957      Component Value Date/Time   CALCIUM 9.9 02/11/2013  1310   CALCIUM 9.5 07/22/2011 0957   ALKPHOS 65 02/11/2013 1310   ALKPHOS 69 07/22/2011 0957   AST 19 02/11/2013 1310   AST 18 07/22/2011 0957   ALT 18 02/11/2013 1310   ALT 17 07/22/2011 0957   BILITOT 2.19* 02/11/2013 1310   BILITOT 1.3* 07/22/2011 0957       Lab Results  Component Value Date   LABCA2 17 07/15/2012     STUDIES:  Bilateral mammogram at [facility] on 09/03/2012 was unremarkable.     ASSESSMENT: 77 y.o.  [place] woman   (1)  status post left lumpectomy and sentinel lymph node sampling 01/29/2011 for a T1BN0 (stage I) invasive lobular breast cancer, grade 2, estrogen receptor 100% positive, progesterone receptor 100% positive, with no HER-2 amplification, and then MIB-1 of 14%;   (2)  s/p radiation completed January 2013,   (3)  on letrozole as of February 2013, the plan being to continue for total of 5 years (to February 2018)  (4)  mild  hypokalemia   (5)  possible lymphadenopathy in the left posterior cervical chain   PLAN:   overall, I believe and is doing very well with regards to her breast cancer. She'll continue on the letrozole and we are making no changes in her current regimen. This was refilled for another year, and the plan will be to continue for total of 5 years.  We reviewed her labs together, including her continued elevation in total bilirubin, and she will share these with her primary care physician as well. I have advised her to increase her dietary potassium for mild hypokalemia today. She is already on oral supplements 20 mEq daily.   Aylana is worried about the possible enlarged lymph node on the left side of her neck, and we discussed obtaining an ultrasound of the neck for further evaluation and a baseline measurement in case this increases in size.   Otherwise, Hettie will have her next mammogram at [facility] in May of 2015. She will return here soon thereafter for repeat labs and physical exam with Dr. Darnelle Catalan. She would very much like to have her  tumor marker (CA27.29) drawn at that time, although she understands the possibility of false positives or negatives with regards to that test.   Of course she'll continue to be followed very closely by her other physicians for multiple comorbidities.  She voices understanding and agreement with this plan as does her husband. They know to contact us with any changes or problems prior to her next appointment.  Meria Crilly PA-C  02/18/2013

## 2013-02-18 NOTE — Telephone Encounter (Signed)
appts made and printed. Pt is aware that cs will call w/ appt for [place]. gv appt for [facility]...td

## 2013-02-23 ENCOUNTER — Ambulatory Visit (HOSPITAL_COMMUNITY)
Admission: RE | Admit: 2013-02-23 | Discharge: 2013-02-23 | Disposition: A | Discharge status: Home / Self Care | Payer: Medicare Other | Source: Ambulatory Visit | Attending: Physician Assistant | Admitting: Physician Assistant

## 2013-02-23 DIAGNOSIS — Z853 Personal history of malignant neoplasm of breast: Secondary | ICD-10-CM

## 2013-02-23 DIAGNOSIS — R220 Localized swelling, mass and lump, head: Secondary | ICD-10-CM | POA: Insufficient documentation

## 2013-02-23 DIAGNOSIS — R590 Localized enlarged lymph nodes: Secondary | ICD-10-CM

## 2013-02-23 DIAGNOSIS — C50919 Malignant neoplasm of unspecified site of unspecified female breast: Secondary | ICD-10-CM | POA: Insufficient documentation

## 2013-02-26 ENCOUNTER — Telehealth: Payer: Self-pay | Admitting: *Deleted

## 2013-02-26 NOTE — Telephone Encounter (Signed)
Patient calling for results of [place]. Negative findings discussed with patient.  If problem persist, she is to let us know.

## 2013-03-12 ENCOUNTER — Telehealth: Payer: Self-pay | Admitting: Pharmacist

## 2013-03-12 NOTE — Telephone Encounter (Signed)
New Problem:  Pt states she would like to speak to [place] about her cholesterol meds. Please advise

## 2013-03-12 NOTE — Telephone Encounter (Signed)
Patient is running out Crestor tablets and needs a refill / samples if possible.  She is tolerating Crestor 10 mg twice weekly.  Baseline LDL ~ 200 mg/dL, which dropped to 045 mg/dL on Crestor 10 mg once weekly.  It was increased to twice weekly in 01/2013 and will recheck bloodwork in 07/2013.  She would also like to sit down with me in the future and review all of her medications.  She was scheduled to have lipid / liver on 07/22/12, and then see me 07/23/13 for lipid clinic visit and medication review.  She was told to bring all of her medications with her.  Prescription for Crestor called to [facility] on [address].  Samples of Crestor 10 mg also left up front with voucher for free 30 day supply.

## 2013-05-14 ENCOUNTER — Telehealth: Payer: Self-pay | Admitting: *Deleted

## 2013-05-14 NOTE — Telephone Encounter (Signed)
Patient walked in for crestor samples. Samples given and voucher for free 30 day supply as she did not pick up the ones that Ysidro Evert put up front for her in November.

## 2013-05-24 ENCOUNTER — Ambulatory Visit
Admission: RE | Admit: 2013-05-24 | Discharge: 2013-05-24 | Disposition: A | Discharge status: Home / Self Care | Payer: Medicare Other | Source: Ambulatory Visit | Attending: Nurse Practitioner | Admitting: Nurse Practitioner

## 2013-05-24 ENCOUNTER — Other Ambulatory Visit: Payer: Self-pay | Admitting: Nurse Practitioner

## 2013-05-24 DIAGNOSIS — J40 Bronchitis, not specified as acute or chronic: Secondary | ICD-10-CM

## 2013-07-15 ENCOUNTER — Encounter: Payer: Self-pay | Admitting: Interventional Cardiology

## 2013-07-22 ENCOUNTER — Other Ambulatory Visit (INDEPENDENT_AMBULATORY_CARE_PROVIDER_SITE_OTHER): Payer: Medicare Other | Admitting: *Deleted

## 2013-07-22 DIAGNOSIS — Z79899 Other long term (current) drug therapy: Secondary | ICD-10-CM

## 2013-07-22 DIAGNOSIS — E782 Mixed hyperlipidemia: Secondary | ICD-10-CM

## 2013-07-22 LAB — ALT: ALT: 20 U/L (ref 0–35)

## 2013-07-22 LAB — LIPID PANEL
CHOLESTEROL: 223 mg/dL — AB (ref 0–200)
HDL: 50.8 mg/dL (ref >39.00)
LDL CALC: 141 mg/dL — AB (ref 0–99)
TRIGLYCERIDES: 156 mg/dL — AB (ref 0.0–149.0)
Total CHOL/HDL Ratio: 4
VLDL: 31.2 mg/dL (ref 0.0–40.0)

## 2013-07-23 ENCOUNTER — Ambulatory Visit (INDEPENDENT_AMBULATORY_CARE_PROVIDER_SITE_OTHER): Payer: Medicare Other | Admitting: Pharmacist

## 2013-07-23 VITALS — Wt 298.0 lb

## 2013-07-23 DIAGNOSIS — Z79899 Other long term (current) drug therapy: Secondary | ICD-10-CM

## 2013-07-23 DIAGNOSIS — E785 Hyperlipidemia, unspecified: Secondary | ICD-10-CM | POA: Insufficient documentation

## 2013-07-23 NOTE — Assessment & Plan Note (Signed)
Patient will continue Crestor 10 mg twice weekly as this is the only regimen she has been able to tolerate so far, and she has achieved a significant LDL reduction with this.  Continue weight loss efforts as she has lost 60 lbs over past year.  I answered supplement questions she had, and she will f/u with PCP regarding hair thinning and rechecking her TSH.  Will recheck lipid / liver in 6 months, and call with results.

## 2013-07-23 NOTE — Patient Instructions (Signed)
1.  Continue all current medications.  Continue Crestor 10 mg twice weekly. 2.  Recheck cholesterol / liver in 6 months (01/26/14).  Will call you with results.

## 2013-07-23 NOTE — Progress Notes (Signed)
Patient is a pleasant 78 y.o WF being seen today as she has multiple medication questions and wants to discuss her cholesterol results.  She has been unable tolerate multiple statins in the past due to muscle aches and elevated LFTs.  Her baseline LDL is > 200 mg/dL, and this is now down to 140 mg/dL on Crestor 10 mg twice weekly, and she is tolerating this well.  She has lost 60 lbs over the past year through diet.  She has cut out red meat, excessive carbs, fatty foods, desserts, high salt fats, and soda.  She hopes to lose enough weight to have knee surgery done due to severe OA.  Unable to exercise due to fatigue, which could be due to AFib and/or weight. Patient asks about her hair thinning out.  She is already on biotin, and has been on atenolol for years, so not likely the source.  She has a h/o hypothyroidism, and hasn't had TSH checked recently.  Due to see PCP and have this done soon though.  LDL goal < 100 mg/dL given CAD and baseline LDL > 200 mg/dL Meds:  Crestor 10 mg twice weekly. Failed multiple statins in past due to myalgias and elevated LFTs.  Tolerating Crestor twice weekly well, and ALT normal today. Family history:  Father had MI at 86 y.o., and brother had MI at 58 y.o. Social history:  Doesn't drink alochol.  She is a former smoker (no longer smoking).  Labs:   07/2013:  TC 223, TG 156, HDL 51, LDL 141, ALT normal (Crestor 10 mg biw) Baseline LDL is > 200 mg/dL  Current Outpatient Prescriptions  Medication Sig Dispense Refill  . albuterol (PROVENTIL HFA;VENTOLIN HFA) 108 (90 BASE) MCG/ACT inhaler Inhale 2 puffs into the lungs as needed.        Marland Kitchen alendronate (FOSAMAX) 70 MG tablet       . allopurinol (ZYLOPRIM) 100 MG tablet Take 300 mg by mouth daily.       Marland Kitchen ALPRAZolam (XANAX) 0.5 MG tablet Take 0.5 mg by mouth 2 (two) times daily as needed.        Marland Kitchen atenolol (TENORMIN) 50 MG tablet Take 50 mg by mouth daily.        . Biotin 5000 MCG CAPS Take by mouth.      .  cholecalciferol (VITAMIN D) 1000 UNITS tablet Take 1,000 Units by mouth daily.      Marland Kitchen co-enzyme Q-10 50 MG capsule Take 400 mg by mouth daily.      . colchicine (COLCRYS) 0.6 MG tablet Take 0.6 mg by mouth daily.      . ergocalciferol (VITAMIN D2) 50000 UNITS capsule Take 50,000 Units by mouth once a week.      . fexofenadine (ALLEGRA) 180 MG tablet Take 180 mg by mouth daily.        Marland Kitchen FLUoxetine (PROZAC) 20 MG capsule Take 20 mg by mouth daily.        . fluticasone (FLONASE) 50 MCG/ACT nasal spray       . Fluticasone-Salmeterol (ADVAIR) 500-50 MCG/DOSE AEPB Inhale 1 puff into the lungs every 12 (twelve) hours.        . hydrochlorothiazide 25 MG tablet Take 50 mg by mouth once.        Marland Kitchen KLOR-CON M20 20 MEQ tablet       . letrozole (FEMARA) 2.5 MG tablet Take 1 tablet (2.5 mg total) by mouth daily.  90 tablet  3  . levothyroxine (SYNTHROID, LEVOTHROID) 50  MCG tablet Take 50 mcg by mouth daily.        . montelukast (SINGULAIR) 10 MG tablet Take 10 mg by mouth at bedtime.        . predniSONE (DELTASONE) 5 MG tablet       . Rivaroxaban (XARELTO) 20 MG TABS Take by mouth daily.        . rosuvastatin (CRESTOR) 10 MG tablet Take 10 mg by mouth. As of 02/18/2013, taking 2 x weekly      . traMADol (ULTRAM) 50 MG tablet       . vitamin C (ASCORBIC ACID) 500 MG tablet Take 500 mg by mouth daily.       No current facility-administered medications for this visit.   Allergies  Allergen Reactions  . Codeine Nausea Only and Anxiety    Also complained of dizziness.  Has same problems with oxycodone and hydrocodone  . Contrast Media [Iodinated Diagnostic Agents] Nausea And Vomiting    IV dye for MRI  . Crestor [Rosuvastatin Calcium]     Severe liver function problems  . Lipitor [Atorvastatin Calcium]     Elevated liver function   . Quinine Derivatives Nausea Only    dizzy  . Sulfa Antibiotics Nausea Only    Also complained of dizziness  . Vicodin [Hydrocodone-Acetaminophen]     Itching all over  .  Epinephrine     Patient becomes "shaky"  . Penicillins Rash    Pt reported all over body rash in 1958  . Theophyllines     Patient becomes "shaky"   Family History  Problem Relation Age of Onset  . Heart disease Father   . Cancer Father     intestinal polyps  . Asthma Sister   . Heart disease Brother

## 2013-08-17 ENCOUNTER — Ambulatory Visit: Payer: Medicare Other | Admitting: Interventional Cardiology

## 2013-09-06 ENCOUNTER — Telehealth: Payer: Self-pay | Admitting: Oncology

## 2013-09-06 ENCOUNTER — Ambulatory Visit (INDEPENDENT_AMBULATORY_CARE_PROVIDER_SITE_OTHER): Payer: Medicare Other | Admitting: Interventional Cardiology

## 2013-09-06 ENCOUNTER — Encounter: Payer: Self-pay | Admitting: Interventional Cardiology

## 2013-09-06 VITALS — BP 141/65 | HR 54 | Ht 65.0 in | Wt 292.0 lb

## 2013-09-06 DIAGNOSIS — E669 Obesity, unspecified: Secondary | ICD-10-CM

## 2013-09-06 DIAGNOSIS — E785 Hyperlipidemia, unspecified: Secondary | ICD-10-CM

## 2013-09-06 DIAGNOSIS — I2510 Atherosclerotic heart disease of native coronary artery without angina pectoris: Secondary | ICD-10-CM

## 2013-09-06 DIAGNOSIS — I4891 Unspecified atrial fibrillation: Secondary | ICD-10-CM

## 2013-09-06 MED ORDER — ATENOLOL 50 MG PO TABS: 25.0000 mg | ORAL_TABLET | Freq: Every day | ORAL | Status: DC

## 2013-09-06 NOTE — Patient Instructions (Addendum)
Your physician has recommended you make the following change in your medication:   1. Decrease Atenolol 50 mg to 1/2 tablet daily.   Your physician wants you to follow-up in: 1 year with Dr. Irish Lack. You will receive a reminder letter in the mail two months in advance. If you don't receive a letter, please call our office to schedule the follow-up appointment.

## 2013-09-06 NOTE — Progress Notes (Signed)
Patient ID: [name], female   DOB: [birth year], 78 y.o.   MRN: [id number]    [address], [address], [place]  [address] Phone: [phone] Fax:  [phone]  Date:  09/06/2013   ID:  [name], DOB Jan 06, 1936, MRN [id number]  PCP:  Horton Finer, MD      History of Present Illness: [name] is a 78 y.o. female who had AFib. She has had Breast cancer in the recent past. Atrial Fibrillation F/U:  Denies : Chest pain.  Dizziness.  Leg edema.  Orthopnea.  Palpitations.  Syncope  No sx lke what she had before stent.   Wt Readings from Last 3 Encounters:  09/06/13 292 lb (132.45 kg)  07/23/13 298 lb (135.172 kg)  02/18/13 303 lb 11.2 oz (137.757 kg)     Past Medical History  Diagnosis Date  . Anxiety   . Allergy   . Asthma   . 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 Incontinence of urine   . Plantar fasciitis   . Breast cancer ([facility]) Receptor + her2 _ 12/26/2010    left  . Gout     post operative  . Atrial fibrillation 01/21/11  . Malignant neoplasm of upper-outer quadrant of female breast 12/26/2010  . Hx of radiation therapy 04/02/11 -05/20/11    left breast    Current Outpatient Prescriptions  Medication Sig Dispense Refill  . albuterol (PROVENTIL HFA;VENTOLIN HFA) 108 (90 BASE) MCG/ACT inhaler Inhale 2 puffs into the lungs as needed.        Marland Kitchen alendronate (FOSAMAX) 70 MG tablet       . allopurinol (ZYLOPRIM) 100 MG tablet Take 300 mg by mouth daily.       Marland Kitchen ALPRAZolam (XANAX) 0.5 MG tablet Take 0.5 mg by mouth 2 (two) times daily as needed.        Marland Kitchen atenolol (TENORMIN) 50 MG tablet Take 0.5 tablets (25 mg total) by mouth daily.      . Biotin 5000 MCG CAPS Take by mouth.      . cholecalciferol (VITAMIN D) 1000 UNITS tablet Take 1,000 Units by mouth daily.      Marland Kitchen co-enzyme Q-10 50 MG capsule Take 400 mg by mouth daily.      . colchicine (COLCRYS) 0.6 MG tablet Take 0.6 mg by mouth daily.      . ergocalciferol (VITAMIN D2) 50000 UNITS capsule  Take 50,000 Units by mouth once a week.      . fexofenadine (ALLEGRA) 180 MG tablet Take 180 mg by mouth daily.        Marland Kitchen FLUoxetine (PROZAC) 20 MG capsule Take 20 mg by mouth daily.        . fluticasone (FLONASE) 50 MCG/ACT nasal spray       . Fluticasone-Salmeterol (ADVAIR) 500-50 MCG/DOSE AEPB Inhale 1 puff into the lungs every 12 (twelve) hours.        . hydrochlorothiazide 25 MG tablet Take 50 mg by mouth once.        Marland Kitchen KLOR-CON M20 20 MEQ tablet       . letrozole (FEMARA) 2.5 MG tablet Take 1 tablet (2.5 mg total) by mouth daily.  90 tablet  3  . levofloxacin (LEVAQUIN) 500 MG tablet       . levothyroxine (SYNTHROID, LEVOTHROID) 50 MCG tablet Take 50 mcg by mouth daily.        . montelukast (SINGULAIR) 10 MG tablet Take 10  mg by mouth at bedtime.        . predniSONE (DELTASONE) 5 MG tablet       . Rivaroxaban (XARELTO) 20 MG TABS Take by mouth daily.        . rosuvastatin (CRESTOR) 10 MG tablet Take 10 mg by mouth. As of 02/18/2013, taking 2 x weekly      . traMADol (ULTRAM) 50 MG tablet       . vitamin C (ASCORBIC ACID) 500 MG tablet Take 500 mg by mouth daily.       No current facility-administered medications for this visit.    Allergies:    Allergies  Allergen Reactions  . Codeine Nausea Only and Anxiety    Also complained of dizziness.  Has same problems with oxycodone and hydrocodone  . Contrast Media [Iodinated Diagnostic Agents] Nausea And Vomiting    IV dye for MRI  . Crestor [Rosuvastatin Calcium]     Severe liver function problems  . Lipitor [Atorvastatin Calcium]     Elevated liver function   . Quinine Derivatives Nausea Only    dizzy  . Sulfa Antibiotics Nausea Only    Also complained of dizziness  . Vicodin [Hydrocodone-Acetaminophen]     Itching all over  . Epinephrine     Patient becomes "shaky"  . Penicillins Rash    Pt reported all over body rash in 1958  . Theophyllines     Patient becomes "shaky"    Social History:  The patient  reports that she  quit smoking about 50 years ago. She has never used smokeless tobacco. She reports that she does not drink alcohol or use illicit drugs.   Family History:  The patient's family history includes Asthma in her sister; Cancer in her father; Heart disease in her brother and father.   ROS:  Please see the history of present illness.  No nausea, vomiting.  No fevers, chills.  No focal weakness.  No dysuria.   All other systems reviewed and negative.   PHYSICAL EXAM: VS:  BP 141/65  Pulse 54  Ht 5' 5" (1.651 m)  Wt 292 lb (132.45 kg)  BMI 48.59 kg/m2 Well nourished, well developed, in no acute distress HEENT: normal Neck: no JVD, no carotid bruits Cardiac:  normal S1, S2; bradycardia, irregular6 Lungs:  clear to auscultation bilaterally, no wheezing, rhonchi or rales Abd: soft, nontender, no hepatomegaly Ext: no edema Skin: warm and dry Neuro:   no focal abnormalities noted  EKG:  AFib, slow ventricular response.    ASSESSMENT AND PLAN:  A-fib  Decrease Atenolol Tablet, 25 MG, take 1 tablet by mouth once daily Continue Xarelto 20 mg tablet, 20 mg, 1 tablet, orally, once a day in the evening with food Notes: Rate controlled. Xarelto for stroke prevention.    2. Coronary atherosclerosis of native coronary artery  IMAGING: EKG    Harward,Amy 08/18/2012 10:37:01 AM > [name],JAY 08/18/2012 11:23:32 AM > AFib, slow ventricular response   Notes: No angina. Prior stent placed. Continue aggressive secondary prevention. She'll also try to lose weight.    3. Combined hyperlipidemia  Continue Crestor Tablet, 10 MG, 1 tablet, Orally, twice a week, 30 day(s), 30 Decrease Vitamin D (Ergocalciferol) Capsule, 2000 UNIT, 1 capsule, Orally, Once a day LAB: Statin Panel (Ordered for 10/07/2012) Notes: LDL over 200 in the past; now 141 in 4/15. She has been intolerant of statins. Consider checking vitamin D level to see if supplementation has raised level and help her tolerate statin. Would  titrate  Crestor 10 mg twice a week.could also try coenzyme Q10. Vitamin D level now at 33. She will take Vit D 2000U daily.    4. Others  Notes: May need knee replacement.  Needs to get to 260 lbs.  Obesity:  She has lost 60 lbs in the past year.    Preventive Medicine  Adult topics discussed:  Diet: healthy diet, low calorie, low fat.  Exercise: 5 days a week, at least 30 minutes of aerobic exercise.      Signed, Mina Marble, MD, [facility] 09/06/2013 4:29 PM

## 2013-09-06 NOTE — Telephone Encounter (Signed)
per GM to movwe pt appt/cld & left a message for pt for new time & date. Will mail pt copy to address on file

## 2013-09-09 ENCOUNTER — Other Ambulatory Visit: Payer: Medicare Other

## 2013-09-16 ENCOUNTER — Ambulatory Visit: Payer: Medicare Other | Admitting: Oncology

## 2013-09-24 ENCOUNTER — Other Ambulatory Visit: Payer: Self-pay | Admitting: Interventional Cardiology

## 2013-09-24 ENCOUNTER — Other Ambulatory Visit: Payer: Self-pay

## 2013-09-24 MED ORDER — RIVAROXABAN 20 MG PO TABS: 20.0000 mg | ORAL_TABLET | Freq: Every day | ORAL | Status: DC

## 2013-09-28 ENCOUNTER — Telehealth: Payer: Self-pay | Admitting: Oncology

## 2013-09-28 NOTE — Telephone Encounter (Signed)
changed 5/28 lab appt time to 11:45am. lmonvm for pt.

## 2013-09-29 ENCOUNTER — Ambulatory Visit (HOSPITAL_BASED_OUTPATIENT_CLINIC_OR_DEPARTMENT_OTHER): Payer: Medicare Other

## 2013-09-29 DIAGNOSIS — C50419 Malignant neoplasm of upper-outer quadrant of unspecified female breast: Secondary | ICD-10-CM

## 2013-09-29 DIAGNOSIS — C50412 Malignant neoplasm of upper-outer quadrant of left female breast: Secondary | ICD-10-CM

## 2013-09-29 LAB — COMPREHENSIVE METABOLIC PANEL (CC13)
ALK PHOS: 77 U/L (ref 40–150)
ALT: 19 U/L (ref 0–55)
ANION GAP: 11 meq/L (ref 3–11)
AST: 21 U/L (ref 5–34)
Albumin: 3.8 g/dL (ref 3.5–5.0)
BILIRUBIN TOTAL: 1.5 mg/dL — AB (ref 0.20–1.20)
BUN: 20.4 mg/dL (ref 7.0–26.0)
CO2: 24 meq/L (ref 22–29)
CREATININE: 0.8 mg/dL (ref 0.6–1.1)
Calcium: 9.7 mg/dL (ref 8.4–10.4)
Chloride: 106 mEq/L (ref 98–109)
GLUCOSE: 102 mg/dL (ref 70–140)
Potassium: 3.5 mEq/L (ref 3.5–5.1)
SODIUM: 141 meq/L (ref 136–145)
Total Protein: 6.7 g/dL (ref 6.4–8.3)

## 2013-09-29 LAB — CBC WITH DIFFERENTIAL/PLATELET
BASO%: 0.8 % (ref 0.0–2.0)
Basophils Absolute: 0 10*3/uL (ref 0.0–0.1)
EOS ABS: 0.3 10*3/uL (ref 0.0–0.5)
EOS%: 5.4 % (ref 0.0–7.0)
HEMATOCRIT: 42.2 % (ref 34.8–46.6)
HGB: 13.4 g/dL (ref 11.6–15.9)
LYMPH%: 24.3 % (ref 14.0–49.7)
MCH: 30.9 pg (ref 25.1–34.0)
MCHC: 31.9 g/dL (ref 31.5–36.0)
MCV: 97 fL (ref 79.5–101.0)
MONO#: 0.6 10*3/uL (ref 0.1–0.9)
MONO%: 10.3 % (ref 0.0–14.0)
NEUT%: 59.2 % (ref 38.4–76.8)
NEUTROS ABS: 3.3 10*3/uL (ref 1.5–6.5)
PLATELETS: 136 10*3/uL — AB (ref 145–400)
RBC: 4.34 10*6/uL (ref 3.70–5.45)
RDW: 14.1 % (ref 11.2–14.5)
WBC: 5.6 10*3/uL (ref 3.9–10.3)
lymph#: 1.4 10*3/uL (ref 0.9–3.3)

## 2013-09-30 ENCOUNTER — Other Ambulatory Visit: Payer: Medicare Other

## 2013-10-04 ENCOUNTER — Encounter (INDEPENDENT_AMBULATORY_CARE_PROVIDER_SITE_OTHER): Payer: Medicare Other | Admitting: Ophthalmology

## 2013-10-04 DIAGNOSIS — H43819 Vitreous degeneration, unspecified eye: Secondary | ICD-10-CM

## 2013-10-04 DIAGNOSIS — Q143 Congenital malformation of choroid: Secondary | ICD-10-CM

## 2013-10-04 DIAGNOSIS — D3130 Benign neoplasm of unspecified choroid: Secondary | ICD-10-CM

## 2013-10-05 ENCOUNTER — Ambulatory Visit (HOSPITAL_BASED_OUTPATIENT_CLINIC_OR_DEPARTMENT_OTHER): Payer: Medicare Other | Admitting: Internal Medicine

## 2013-10-05 VITALS — BP 135/75 | HR 81 | Temp 98.1°F | Resp 18 | Ht 65.0 in | Wt 288.4 lb

## 2013-10-05 DIAGNOSIS — C50419 Malignant neoplasm of upper-outer quadrant of unspecified female breast: Secondary | ICD-10-CM

## 2013-10-05 DIAGNOSIS — Z923 Personal history of irradiation: Secondary | ICD-10-CM

## 2013-10-05 DIAGNOSIS — C50919 Malignant neoplasm of unspecified site of unspecified female breast: Secondary | ICD-10-CM

## 2013-10-05 DIAGNOSIS — E876 Hypokalemia: Secondary | ICD-10-CM

## 2013-10-05 DIAGNOSIS — Z170 Estrogen receptor positive status [ER+]: Secondary | ICD-10-CM

## 2013-10-05 NOTE — Progress Notes (Signed)
ID: [name]   DOB: Apr 21, 1936  MR#: [id number]  DUK#:[id number]  PCP: Mercedes Finer, MD GYN: SU: [name]/[name] OTHER MD: [name], [name], [name], [name]  CHIEF COMPLAINT:  Left Breast invasive lobular cancer  HISTORY OF PRESENT ILLNESS:  Pleasant 78 year old female with the following issues 1) Stage I left breast invasive lobular carcinoma, status post lumpectomy and sentinel lymph node dissection on September of 2012, grade 2, ER positive, PR positive, HER-2/neu negative, status post radiation completed in January of 2013, on letrozole since February of 2013  PRIOR HISTORY: Ms. [name] had an unremarkable screening mammogram August of 2011.  On 12/18/2010 however a potential abnormality was noted in the left breast and she was recalled for additional views 08/16.  Dr. Marcelo [name] was able to demonstrate a focal irregular density in the upper outer quadrant of the left breast associated with linear microcalcifications.  By ultrasound a 9 mm hypoechoic mass was noted at the corresponding area.  This warranted biopsy and a biopsy was performed under ultrasound guidance 08/21.  The pathology showed ([id number]) an invasive lobular carcinoma, E cadherin negative, associated with ductal carcinoma in situ.  The invasive lobular carcinoma was HER-2 negative with a ratio by CISH of 1.23.  Estrogen receptor was 100% positive, progesterone receptor was 100% positive and the proliferation marker was 14%.  With this information the patient was referred to Dr. Margot [name] and bilateral breast MRIs were obtained 12/28/2010.  In the upper outer quadrant of the left breast there was a small irregular nodule measuring 1.2 cm maximally.  There were no other suspicious abnormalities in the left breast or in the right breast.  There were no suspicious internal mammary or axillary lymph nodes identified.  With this information the patient is referred for further evaluation and  treatment.   INTERVAL HISTORY: Patient is unable to ambulate well due to severe knee arthritis. She is in a wheelchair today. She continues to take letrozole on a daily basis without fail. She has minimal side effects. She denies any complaints of hot flashes. She has no new complaints of arthralgias and myalgias that are unrelated to her arthritis. Her main arthritic symptoms are her knees and right shoulder. She's been eating well. She denies any bowel or urine function problems.  Mammogram on 09/07/2013, with spot compression on right, did not reveal any abnormalities. She is on Fosamax per her PCP. She has been on Fosamax for 8 years and she is on constant dialog with her PCP on the safety and duration of Fosamax.  ROS:  Negative for respiratory, negative for skin, negative for neurology,  Positive for cardiovascular with occasional palpitations known afib, negative for gastroenterology, negative for endocrine, negative for ID, negative for genitourinary, negative for mental health, negative for constitutional symptoms, negative for musculoskeletal, negative for HEENT  PAST MEDICAL HISTORY: Past Medical History  Diagnosis Date  . Anxiety   . Allergy   . Asthma   . 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 Incontinence of urine   . Plantar fasciitis   . Breast cancer ([place]) Receptor + her2 _ 12/26/2010    left  . Gout     post operative  . Atrial fibrillation 01/21/11  . Malignant neoplasm of upper-outer quadrant of female breast 12/26/2010  . Hx of radiation therapy 04/02/11 -05/20/11    left breast  The past medical history is significant for hypertension, asthma, hypothyroidism, osteoarthritis/degenerative disk disease with particular problems with her  right knee, history of depression, history of paresthesias, hypercholesterolemia, allergic rhinitis, osteopenia, status post tonsillectomy, status post cholecystectomy, status post right hand surgery, history of fractures to the arms and  legs, history of coronary artery disease status post stenting of her right coronary with a good cath report in 2001.  She is status post D and C and cryosurgery.  PAST SURGICAL HISTORY: Past Surgical History  Procedure Laterality Date  . Tonsillectomy  1955  . Cholecystectomy  1955  . Hand surgery  1992    tendons between thumb and forefinger  . Coronary angioplasty with stent placement  2000  . Skin biopsy      1994, 2006, 2008, 2010, 2011, 2011, 2012-  pre-cancerous  . Dilation and curettage of uterus  1976    and 1996  . Breast lumpectomy w/ needle localization  01/29/2011    Left with [facility] Dr [name]    FAMILY HISTORY Family History  Problem Relation Age of Onset  . Heart disease Father   . Cancer Father     intestinal polyps  . Asthma Sister   . Heart disease Brother   The patient's father had surgery for cancerous polyps in the colon at age 33.  He died at age 17 from a heart attack.  The patient's mother died at age 41.  The patient has 1 brother alive at age 83 and in good health.  She has 1 sister also in good health.  There is no family history of breast or ovarian cancer.  GYNECOLOGIC HISTORY: She had menarche at age 36, last period in 60.  She took hormone replacement for less than a year, stopping in 1996.  She carried 3 children to term, the first one at age 25.  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  She is also an active Nurse, adult.  She is now retired.  Her husband, [name], is present today as are her daughters, [name] and [name].  [name] is 25, lives here in [place] and is a wound treatment specialist.  [name] also lives in [place] and is an Optometrist.  Son, [name], died at the age of 21 from what seems to have been a heart attack.  The patient has 2 grandchildren.   ADVANCED DIRECTIVES: In place  HEALTH MAINTENANCE: (Updated 02/18/2013) History   Substance Use Topics  . Smoking status: Former Smoker -- 1.00 packs/day    Quit date: 12/05/1962  . Smokeless tobacco: Never Used  . Alcohol Use: No   Colonoscopy:  PAP:  Bone density: Not on file  Lipid panel: Sept 2014, Dr. Irish Lack  Allergies  Allergen Reactions  . Crestor [Rosuvastatin Calcium]     Severe liver function problems  . Vicodin [Hydrocodone-Acetaminophen] Itching    Itching all over  . Codeine Nausea Only and Anxiety    Also complained of dizziness.  Has same problems with oxycodone and hydrocodone  . Quinine Derivatives Nausea Only    dizzy  . Sulfa Antibiotics Nausea Only    Also complained of dizziness  . Contrast Media [Iodinated Diagnostic Agents] Nausea And Vomiting    IV dye for MRI  . Lipitor [Atorvastatin Calcium]     Elevated liver function   . Epinephrine     Patient becomes "shaky"  . Penicillins Rash    Pt reported all over body rash in 1958  . Theophyllines     Patient becomes "shaky"  Current Outpatient Prescriptions  Medication Sig Dispense Refill  . albuterol (PROVENTIL HFA;VENTOLIN HFA) 108 (90 BASE) MCG/ACT inhaler Inhale 2 puffs into the lungs as needed.        Marland Kitchen alendronate (FOSAMAX) 70 MG tablet Take 70 mg by mouth once a week. Patient takes on Saturday      . allopurinol (ZYLOPRIM) 100 MG tablet Take 300 mg by mouth daily.       Marland Kitchen ALPRAZolam (XANAX) 0.5 MG tablet Take 0.5 mg by mouth 2 (two) times daily as needed.        Marland Kitchen atenolol (TENORMIN) 50 MG tablet Take 0.5 tablets (25 mg total) by mouth daily.      . Biotin 5000 MCG CAPS Take by mouth.      . cholecalciferol (VITAMIN D) 1000 UNITS tablet Take 1,000 Units by mouth daily.      Marland Kitchen co-enzyme Q-10 50 MG capsule Take 400 mg by mouth daily.      . colchicine (COLCRYS) 0.6 MG tablet Take 0.6 mg by mouth daily.      . fexofenadine (ALLEGRA) 180 MG tablet Take 180 mg by mouth daily.        Marland Kitchen FLUoxetine (PROZAC) 20 MG capsule Take 20 mg by mouth daily.        . fluticasone  (FLONASE) 50 MCG/ACT nasal spray       . Fluticasone-Salmeterol (ADVAIR) 500-50 MCG/DOSE AEPB Inhale 1 puff into the lungs every 12 (twelve) hours.        . hydrochlorothiazide 25 MG tablet Take 50 mg by mouth once.        Marland Kitchen KLOR-CON M20 20 MEQ tablet       . letrozole (FEMARA) 2.5 MG tablet Take 1 tablet (2.5 mg total) by mouth daily.  90 tablet  3  . levothyroxine (SYNTHROID, LEVOTHROID) 50 MCG tablet Take 50 mcg by mouth daily.        . montelukast (SINGULAIR) 10 MG tablet Take 10 mg by mouth at bedtime.        . predniSONE (DELTASONE) 5 MG tablet Take 5 mg by mouth daily as needed.       . rivaroxaban (XARELTO) 20 MG TABS tablet Take 1 tablet (20 mg total) by mouth daily.  90 tablet  3  . rosuvastatin (CRESTOR) 10 MG tablet Take 10 mg by mouth. As of 02/18/2013, taking 2 x weekly      . traMADol (ULTRAM) 50 MG tablet       . vitamin C (ASCORBIC ACID) 500 MG tablet Take 500 mg by mouth daily.       No current facility-administered medications for this visit.   OBJECTIVE: Elderly white woman examined in a wheelchair Filed Vitals:   10/05/13 1513  BP: 135/75  Pulse: 81  Temp: 98.1 F (36.7 C)  Resp: 18     Body mass index is 47.99 kg/(m^2).    ECOG FS: 2 Filed Weights   10/05/13 1513  Weight: 288 lb 6.4 oz (130.817 kg)   General: well-nourished, in a wheelchair, obese, in no acute distress. Alert and oriented X 3 HEENT: PERRLA. Anicteric sclerae.  Mucous membranes moist Lymph nodes: no adenopathy appreciated within supraclavicular, infraclavicular or axillary regions.  Lungs: Clear to ausculatation throughout. No rales or wheezing. Cardiovascular: Irregularly irregular. S1, S2. Difficult to assess for jugular vein distension. Positive peripheral pulses. Breast:  no palpable masses in both breasts. She has pendulous breasts bilaterally. Abdomen:  Positive bowel sounds. No hepatomegaly. Spleen tip could not  be felt secondary to body habitus. Non-tender. Non-distended Groin: No  adenopathy Extremities: No swelling bilaterally Cranial nerves: no gross focal deficits Skin: unremarkable  LAB RESULTS: Lab Results  Component Value Date   WBC 5.6 09/29/2013   NEUTROABS 3.3 09/29/2013   HGB 13.4 09/29/2013   HCT 42.2 09/29/2013   MCV 97.0 09/29/2013   PLT 136* 09/29/2013      Chemistry      Component Value Date/Time   NA 141 09/29/2013 1549   NA 141 07/22/2011 0957   K 3.5 09/29/2013 1549   K 3.6 07/22/2011 0957   CL 105 07/15/2012 1409   CL 104 07/22/2011 0957   CO2 24 09/29/2013 1549   CO2 25 07/22/2011 0957   BUN 20.4 09/29/2013 1549   BUN 15 07/22/2011 0957   CREATININE 0.8 09/29/2013 1549   CREATININE 0.87 07/22/2011 0957      Component Value Date/Time   CALCIUM 9.7 09/29/2013 1549   CALCIUM 9.5 07/22/2011 0957   ALKPHOS 77 09/29/2013 1549   ALKPHOS 69 07/22/2011 0957   AST 21 09/29/2013 1549   AST 18 07/22/2011 0957   ALT 19 09/29/2013 1549   ALT 20 07/22/2013 0926   BILITOT 1.50* 09/29/2013 1549   BILITOT 1.3* 07/22/2011 0957     Lab Results  Component Value Date   LABCA2 17 07/15/2012   IMPRESSION/REPORT/PLAN: 78 y.o.  [place] woman   (1)  status post left lumpectomy and sentinel lymph node sampling 01/29/2011 for a T1BN0 (stage I) invasive lobular breast cancer, grade 2, estrogen receptor 100% positive, progesterone receptor 100% positive, with no HER-2 amplification, and then MIB-1 of 14%;   (2)  s/p radiation completed January 2013,   (3)  on letrozole as of February 2013, the plan being to continue for total of 5 years (to February 2018)  (4)  mild hypokalemia    She has no clinical or radiographic evidence for recurrence. She will continue on oral aromatase inhibitor/letrozole. She is already on bone prophylaxis with Fosamax. We discussed the length of time that she has been on Fosamax. We discussed the risks for bone softening on long-term bisphosphonates. She is aware and will discuss further with her primary care physician for other continuation  down the road.  Recommend that she come back for followup in 6 months for a good physical exam. We reviewed her labs. Potassium is good at 3.5. Continue current supplements. She had a recent CA 27.29 tumor marker done per her request. The results are not available yet. We had a good discussion on the value of this marker. I would recommend that this tumor marker not be done unless there is a clinical reason for that by her physicians.  Multiple questions answered.  Total time 30 minutes face-to-face with more than 50% on counseling  Doristine Church MD  10/05/2013

## 2013-10-08 ENCOUNTER — Telehealth: Payer: Self-pay | Admitting: Oncology

## 2013-10-08 NOTE — Telephone Encounter (Signed)
, °

## 2014-01-26 ENCOUNTER — Other Ambulatory Visit: Payer: Medicare Other

## 2014-01-27 ENCOUNTER — Other Ambulatory Visit (INDEPENDENT_AMBULATORY_CARE_PROVIDER_SITE_OTHER): Payer: Medicare Other

## 2014-01-27 DIAGNOSIS — Z79899 Other long term (current) drug therapy: Secondary | ICD-10-CM

## 2014-01-27 DIAGNOSIS — E785 Hyperlipidemia, unspecified: Secondary | ICD-10-CM

## 2014-01-27 LAB — LIPID PANEL
CHOLESTEROL: 209 mg/dL — AB (ref 0–200)
HDL: 57.3 mg/dL (ref >39.00)
LDL CALC: 131 mg/dL — AB (ref 0–99)
NonHDL: 151.7
TRIGLYCERIDES: 104 mg/dL (ref 0.0–149.0)
Total CHOL/HDL Ratio: 4
VLDL: 20.8 mg/dL (ref 0.0–40.0)

## 2014-01-27 LAB — HEPATIC FUNCTION PANEL
ALBUMIN: 3.8 g/dL (ref 3.5–5.2)
ALT: 26 U/L (ref 0–35)
AST: 26 U/L (ref 0–37)
Alkaline Phosphatase: 62 U/L (ref 39–117)
Bilirubin, Direct: 0.1 mg/dL (ref 0.0–0.3)
TOTAL PROTEIN: 6.8 g/dL (ref 6.0–8.3)
Total Bilirubin: 0.6 mg/dL (ref 0.2–1.2)

## 2014-02-01 ENCOUNTER — Other Ambulatory Visit: Payer: Self-pay | Admitting: Obstetrics and Gynecology

## 2014-02-01 ENCOUNTER — Other Ambulatory Visit (HOSPITAL_COMMUNITY)
Admission: RE | Admit: 2014-02-01 | Discharge: 2014-02-01 | Disposition: A | Discharge status: Home / Self Care | Payer: Medicare Other | Source: Ambulatory Visit | Attending: Obstetrics and Gynecology | Admitting: Obstetrics and Gynecology

## 2014-02-01 DIAGNOSIS — Z124 Encounter for screening for malignant neoplasm of cervix: Secondary | ICD-10-CM | POA: Insufficient documentation

## 2014-02-01 DIAGNOSIS — Z1151 Encounter for screening for human papillomavirus (HPV): Secondary | ICD-10-CM | POA: Diagnosis not present

## 2014-02-03 LAB — CYTOLOGY - PAP

## 2014-03-10 ENCOUNTER — Other Ambulatory Visit: Payer: Self-pay | Admitting: *Deleted

## 2014-03-10 ENCOUNTER — Ambulatory Visit: Payer: Medicare Other | Admitting: Pharmacist

## 2014-03-10 DIAGNOSIS — C50419 Malignant neoplasm of upper-outer quadrant of unspecified female breast: Secondary | ICD-10-CM

## 2014-03-10 MED ORDER — LETROZOLE 2.5 MG PO TABS: 2.5000 mg | ORAL_TABLET | Freq: Every day | ORAL | Status: DC

## 2014-04-01 ENCOUNTER — Other Ambulatory Visit: Payer: Self-pay | Admitting: *Deleted

## 2014-04-01 DIAGNOSIS — C50419 Malignant neoplasm of upper-outer quadrant of unspecified female breast: Secondary | ICD-10-CM

## 2014-04-04 ENCOUNTER — Other Ambulatory Visit (HOSPITAL_BASED_OUTPATIENT_CLINIC_OR_DEPARTMENT_OTHER): Payer: Medicare Other

## 2014-04-04 DIAGNOSIS — C50419 Malignant neoplasm of upper-outer quadrant of unspecified female breast: Secondary | ICD-10-CM

## 2014-04-04 DIAGNOSIS — C50412 Malignant neoplasm of upper-outer quadrant of left female breast: Secondary | ICD-10-CM

## 2014-04-04 LAB — CBC WITH DIFFERENTIAL/PLATELET
BASO%: 0.6 % (ref 0.0–2.0)
Basophils Absolute: 0 10*3/uL (ref 0.0–0.1)
EOS%: 3.9 % (ref 0.0–7.0)
Eosinophils Absolute: 0.2 10*3/uL (ref 0.0–0.5)
HCT: 39.5 % (ref 34.8–46.6)
HGB: 12.7 g/dL (ref 11.6–15.9)
LYMPH#: 1.2 10*3/uL (ref 0.9–3.3)
LYMPH%: 19.3 % (ref 14.0–49.7)
MCH: 31.6 pg (ref 25.1–34.0)
MCHC: 32.1 g/dL (ref 31.5–36.0)
MCV: 98.6 fL (ref 79.5–101.0)
MONO#: 0.7 10*3/uL (ref 0.1–0.9)
MONO%: 11.1 % (ref 0.0–14.0)
NEUT#: 4 10*3/uL (ref 1.5–6.5)
NEUT%: 65.1 % (ref 38.4–76.8)
Platelets: 133 10*3/uL — ABNORMAL LOW (ref 145–400)
RBC: 4 10*6/uL (ref 3.70–5.45)
RDW: 14.6 % — ABNORMAL HIGH (ref 11.2–14.5)
WBC: 6.1 10*3/uL (ref 3.9–10.3)

## 2014-04-04 LAB — COMPREHENSIVE METABOLIC PANEL (CC13)
ALT: 22 U/L (ref 0–55)
ANION GAP: 9 meq/L (ref 3–11)
AST: 23 U/L (ref 5–34)
Albumin: 3.5 g/dL (ref 3.5–5.0)
Alkaline Phosphatase: 66 U/L (ref 40–150)
BILIRUBIN TOTAL: 1.36 mg/dL — AB (ref 0.20–1.20)
BUN: 19.6 mg/dL (ref 7.0–26.0)
CALCIUM: 9.4 mg/dL (ref 8.4–10.4)
CHLORIDE: 106 meq/L (ref 98–109)
CO2: 28 meq/L (ref 22–29)
CREATININE: 0.8 mg/dL (ref 0.6–1.1)
Glucose: 85 mg/dl (ref 70–140)
Potassium: 3.8 mEq/L (ref 3.5–5.1)
SODIUM: 143 meq/L (ref 136–145)
Total Protein: 6.2 g/dL — ABNORMAL LOW (ref 6.4–8.3)

## 2014-04-05 ENCOUNTER — Telehealth: Payer: Self-pay | Admitting: Oncology

## 2014-04-05 ENCOUNTER — Ambulatory Visit (HOSPITAL_BASED_OUTPATIENT_CLINIC_OR_DEPARTMENT_OTHER): Payer: Medicare Other | Admitting: Oncology

## 2014-04-05 ENCOUNTER — Other Ambulatory Visit: Payer: Self-pay | Admitting: Oncology

## 2014-04-05 VITALS — BP 124/60 | HR 58 | Temp 97.9°F | Resp 20 | Ht 65.0 in | Wt 293.8 lb

## 2014-04-05 DIAGNOSIS — Z170 Estrogen receptor positive status [ER+]: Secondary | ICD-10-CM

## 2014-04-05 DIAGNOSIS — I4891 Unspecified atrial fibrillation: Secondary | ICD-10-CM

## 2014-04-05 DIAGNOSIS — G4730 Sleep apnea, unspecified: Secondary | ICD-10-CM

## 2014-04-05 DIAGNOSIS — E876 Hypokalemia: Secondary | ICD-10-CM

## 2014-04-05 DIAGNOSIS — B358 Other dermatophytoses: Secondary | ICD-10-CM

## 2014-04-05 DIAGNOSIS — C50412 Malignant neoplasm of upper-outer quadrant of left female breast: Secondary | ICD-10-CM

## 2014-04-05 MED ORDER — FLUCONAZOLE 100 MG PO TABS: ORAL_TABLET | ORAL | Status: DC

## 2014-04-05 NOTE — Progress Notes (Signed)
ID: [name]   DOB: [birth year]  MR#: [id number]  PTW#:[id number]  PCP: Horton Finer, MD GYN: SU: Christian Streck/Haywood Dalbert Batman OTHER MD: Nena Polio, Melrose Nakayama, Sherri Sear  CHIEF COMPLAINT:  Left Breast Cancer  CURRENT TREATMENT: Letrozole   HISTORY OF PRESENT ILLNESS: From the original intake note:  Ms. Lingle had an unremarkable screening mammogram August of 2011.  On 12/18/2010 however a potential abnormality was noted in the left breast and she was recalled for additional views 08/16.  Dr. Marcelo Baldy was able to demonstrate a focal irregular density in the upper outer quadrant of the left breast associated with linear microcalcifications.  By ultrasound a 9 mm hypoechoic mass was noted at the corresponding area.  This warranted biopsy and a biopsy was performed under ultrasound guidance 08/21.  The pathology showed ([id number]) an invasive lobular carcinoma, E cadherin negative, associated with ductal carcinoma in situ.  The invasive lobular carcinoma was HER-2 negative with a ratio by CISH of 1.23.  Estrogen receptor was 100% positive, progesterone receptor was 100% positive and the proliferation marker was 14%.  With this information the patient was referred to Dr. Margot Chimes and bilateral breast MRIs were obtained 12/28/2010.  In the upper outer quadrant of the left breast there was a small irregular nodule measuring 1.2 cm maximally.  There were no other suspicious abnormalities in the left breast or in the right breast.  There were no suspicious internal mammary or axillary lymph nodes identified.  With this information the patient is referred for further evaluation and treatment.  Further treatment is as detailed below.  INTERVAL HISTORY: Nazanin returns today accompanied by her husband, [name], for followup of her breast cancer. The interval history is generally stable. She continues on letrozole, which she is tolerating well, with no significant complaints  regarding hot flashes or vaginal dryness.  REVIEW OF SYSTEMS: Yarianna is trying hard to lose weight so she can have her knee surgery. She has gone" boredom diet" always eating the same things except 2 days a week and she tells me she has gone from about 352 about 270 pounds. She really does not want to have her stomach stapled. Aside from the diet and weight problems she continues to have pain in her knees and shoulder. She is on various supplements which help with that as well. She had a significant yeast infection which was very widespread and in the groin axillary and inframammary areas. She is working very hard to get rid of that with nystatin cream and florist for. Of course she has a variety of other problems including atrial fibrillation and her asthma and multiple allergies, frequent urinary tract infections, sleep apnea, etc. These problems are no different than before.  PAST MEDICAL HISTORY: Past Medical History  Diagnosis Date  . Anxiety   . Allergy   . Asthma   . 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 Incontinence of urine   . Plantar fasciitis   . Breast cancer ([place]) Receptor + her2 _ 12/26/2010    left  . Gout     post operative  . Atrial fibrillation 01/21/11  . Malignant neoplasm of upper-outer quadrant of female breast 12/26/2010  . Hx of radiation therapy 04/02/11 -05/20/11    left breast  The past medical history is significant for hypertension, asthma, hypothyroidism, osteoarthritis/degenerative disk disease with particular problems with her right knee, history of depression, history of paresthesias, hypercholesterolemia, allergic rhinitis, osteopenia, status post tonsillectomy, status post cholecystectomy,  status post right hand surgery, history of fractures to the arms and legs, history of coronary artery disease status post stenting of her right coronary with a good cath report in 2001.  She is status post D and C and cryosurgery.  PAST SURGICAL HISTORY: Past Surgical History   Procedure Laterality Date  . Tonsillectomy  1955  . Cholecystectomy  1955  . Hand surgery  1992    tendons between thumb and forefinger  . Coronary angioplasty with stent placement  2000  . Skin biopsy      1994, 2006, 2008, 2010, 2011, 2011, 2012-  pre-cancerous  . Dilation and curettage of uterus  1976    and 1996  . Breast lumpectomy w/ needle localization  01/29/2011    Left with [facility] Dr Margot Chimes    FAMILY HISTORY Family History  Problem Relation Age of Onset  . Heart disease Father   . Cancer Father     intestinal polyps  . Asthma Sister   . Heart disease Brother   The patient's father had surgery for cancerous polyps in the colon at age 72.  He died at age 31 from a heart attack.  The patient's mother died at age 100.  The patient has 1 brother alive at age 23 and in good health.  She has 1 sister also in good health.  There is no family history of breast or ovarian cancer.  GYNECOLOGIC HISTORY: She had menarche at age 39, last period in 2.  She took hormone replacement for less than a year, stopping in 1996.  She carried 3 children to term, the first one at age 15.  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  She is also an active Nurse, adult.  She is now retired.  Her husband, Wendelyn Kiesling, is present today as are her daughters, Mitchum and [name].  Camauri Fleece is 54, lives here in [place] and is a wound treatment specialist.  [name] also lives in [place] and is an Optometrist.  Son, Yaneisy Wenz, died at the age of 50 from what seems to have been a heart attack.  The patient has 2 grandchildren.   ADVANCED DIRECTIVES: In place  HEALTH MAINTENANCE: (Updated 02/18/2013) History  Substance Use Topics  . Smoking status: Former Smoker -- 1.00 packs/day    Quit date: 12/05/1962  . Smokeless tobacco: Never Used  . Alcohol Use: No     Colonoscopy:  PAP:  Bone density: Not on file  Lipid  panel: Sept 2014, Dr. Irish Lack    Allergies  Allergen Reactions  . Crestor [Rosuvastatin Calcium]     Severe liver function problems  . Vicodin [Hydrocodone-Acetaminophen] Itching    Itching all over  . Codeine Nausea Only and Anxiety    Also complained of dizziness.  Has same problems with oxycodone and hydrocodone  . Quinine Derivatives Nausea Only    dizzy  . Sulfa Antibiotics Nausea Only    Also complained of dizziness  . Contrast Media [Iodinated Diagnostic Agents] Nausea And Vomiting    IV dye for MRI  . Lipitor [Atorvastatin Calcium]     Elevated liver function   . Epinephrine     Patient becomes "shaky"  . Penicillins Rash    Pt reported all over body rash in 1958  . Theophyllines     Patient becomes "shaky"    Current Outpatient Prescriptions  Medication Sig Dispense Refill  . albuterol (  PROVENTIL HFA;VENTOLIN HFA) 108 (90 BASE) MCG/ACT inhaler Inhale 2 puffs into the lungs as needed.      Marland Kitchen alendronate (FOSAMAX) 70 MG tablet Take 70 mg by mouth once a week. Patient takes on Saturday    . allopurinol (ZYLOPRIM) 100 MG tablet Take 300 mg by mouth daily.     Marland Kitchen ALPRAZolam (XANAX) 0.5 MG tablet Take 0.5 mg by mouth 2 (two) times daily as needed.      Marland Kitchen atenolol (TENORMIN) 50 MG tablet Take 0.5 tablets (25 mg total) by mouth daily.    . Biotin 5000 MCG CAPS Take by mouth.    . cholecalciferol (VITAMIN D) 1000 UNITS tablet Take 1,000 Units by mouth daily.    Marland Kitchen co-enzyme Q-10 50 MG capsule Take 400 mg by mouth daily.    . colchicine (COLCRYS) 0.6 MG tablet Take 0.6 mg by mouth daily.    . fexofenadine (ALLEGRA) 180 MG tablet Take 180 mg by mouth daily.      Marland Kitchen FLUoxetine (PROZAC) 20 MG capsule Take 20 mg by mouth daily.      . fluticasone (FLONASE) 50 MCG/ACT nasal spray     . Fluticasone-Salmeterol (ADVAIR) 500-50 MCG/DOSE AEPB Inhale 1 puff into the lungs every 12 (twelve) hours.      . hydrochlorothiazide 25 MG tablet Take 50 mg by mouth once.      Marland Kitchen KLOR-CON M20 20  MEQ tablet     . letrozole (FEMARA) 2.5 MG tablet Take 1 tablet (2.5 mg total) by mouth daily. 90 tablet 0  . levothyroxine (SYNTHROID, LEVOTHROID) 50 MCG tablet Take 50 mcg by mouth daily.      . montelukast (SINGULAIR) 10 MG tablet Take 10 mg by mouth at bedtime.      . predniSONE (DELTASONE) 5 MG tablet Take 5 mg by mouth daily as needed.     . rivaroxaban (XARELTO) 20 MG TABS tablet Take 1 tablet (20 mg total) by mouth daily. 90 tablet 3  . rosuvastatin (CRESTOR) 10 MG tablet Take 10 mg by mouth. As of 02/18/2013, taking 2 x weekly    . traMADol (ULTRAM) 50 MG tablet     . vitamin C (ASCORBIC ACID) 500 MG tablet Take 500 mg by mouth daily.     No current facility-administered medications for this visit.    OBJECTIVE: Elderly white woman examined in a wheelchair Filed Vitals:   04/05/14 1410  BP: 124/60  Pulse: 58  Temp: 97.9 F (36.6 C)  Resp: 20     Body mass index is 48.89 kg/(m^2).    ECOG FS: 2 Filed Weights   04/05/14 1410  Weight: 293 lb 12.8 oz (133.267 kg)  Sclerae unicteric, pupils equal and reactive Oropharynx clear and moist-- no thrush noted No cervical or supraclavicular adenopathy Lungs no rales or rhonchi Heart regular rate and rhythm Abd soft, obese, nontender, positive bowel sounds MSK no focal spinal tenderness Neuro: nonfocal, well oriented, positive affect Breasts: The right breast is unremarkable. The left breast is status post lumpectomy and radiation. There is no evidence of local recurrence. The left axilla is benign.   LAB RESULTS: Lab Results  Component Value Date   WBC 6.1 04/04/2014   NEUTROABS 4.0 04/04/2014   HGB 12.7 04/04/2014   HCT 39.5 04/04/2014   MCV 98.6 04/04/2014   PLT 133* 04/04/2014      Chemistry      Component Value Date/Time   NA 143 04/04/2014 1459   NA 141 07/22/2011 0957  K 3.8 04/04/2014 1459   K 3.6 07/22/2011 0957   CL 105 07/15/2012 1409   CL 104 07/22/2011 0957   CO2 28 04/04/2014 1459   CO2 25 07/22/2011  0957   BUN 19.6 04/04/2014 1459   BUN 15 07/22/2011 0957   CREATININE 0.8 04/04/2014 1459   CREATININE 0.87 07/22/2011 0957      Component Value Date/Time   CALCIUM 9.4 04/04/2014 1459   CALCIUM 9.5 07/22/2011 0957   ALKPHOS 66 04/04/2014 1459   ALKPHOS 62 01/27/2014 0931   AST 23 04/04/2014 1459   AST 26 01/27/2014 0931   ALT 22 04/04/2014 1459   ALT 26 01/27/2014 0931   BILITOT 1.36* 04/04/2014 1459   BILITOT 0.6 01/27/2014 0931       Lab Results  Component Value Date   LABCA2 17 07/15/2012     STUDIES: No results found.   ASSESSMENT: 78 y.o.  [place] woman   (1)  status post left lumpectomy and sentinel lymph node sampling 01/29/2011 for a T1BN0 (stage I) invasive lobular breast cancer, grade 2, estrogen receptor 100% positive, progesterone receptor 100% positive, with no HER-2 amplification, and then MIB-1 of 14%;   (2)  s/p radiation completed January 2013,   (3)  on letrozole as of February 2013, the plan being to continue for total of 5 years (to February 2018)  (4)  mild hypokalemia   (5)  possible lymphadenopathy in the left posterior cervical chain--no significant finding on [place] October 2014  (6) morbid obesity  (7) sleep apnea   PLAN:  Lynora is doing fine from a breast cancer point of view and the plan will be to continue letrozole through February 2017, at which point she will "graduate". Today I wrote her a prescription for Diflucan and instructed her to use it on a daily basis if and when the current intervention she is trying to control her dermatophytosis as seemed to not work. Think 3-5 days of Diflucan probably would bring things back under control and then she can continue the current regimen, which she is tolerating well and which is working well for her.  Otherwise she will see [place] again in 6 months. She knows to call for any problems that may develop before her next visit here.   Chauncey Cruel, MD   04/05/2014

## 2014-04-05 NOTE — Addendum Note (Signed)
Addended by: Laureen Abrahams on: 04/05/2014 06:23 PM   Modules accepted: Medications

## 2014-05-19 ENCOUNTER — Telehealth: Payer: Self-pay | Admitting: Interventional Cardiology

## 2014-05-19 NOTE — Telephone Encounter (Signed)
New Message     Please give [name] from [facility] opthamology and would like to know if Doxycycline is all right for her to take since she is on xerlto. Please call back

## 2014-05-19 NOTE — Telephone Encounter (Signed)
Forwarding to Elberta Leatherwood, Pharm D and Dr. Irish Lack for review.

## 2014-05-20 NOTE — Telephone Encounter (Signed)
Follow up      Checking status on call from yesterday regarding doxycycline and pt on xarelto

## 2014-05-20 NOTE — Telephone Encounter (Signed)
LMOM for [name] giving okay to take doxycycline with Xarelto

## 2014-06-06 ENCOUNTER — Other Ambulatory Visit: Payer: Self-pay | Admitting: *Deleted

## 2014-06-06 DIAGNOSIS — C50419 Malignant neoplasm of upper-outer quadrant of unspecified female breast: Secondary | ICD-10-CM

## 2014-06-06 MED ORDER — LETROZOLE 2.5 MG PO TABS: 2.5000 mg | ORAL_TABLET | Freq: Every day | ORAL | Status: DC

## 2014-06-13 ENCOUNTER — Telehealth: Payer: Self-pay | Admitting: Interventional Cardiology

## 2014-06-13 NOTE — Telephone Encounter (Signed)
New Message     Patient needs to know if it is all right to take Benadryl along with her heart medication.(Atenolol) pt has mild a-fib   Please give patient a call back to let her know.

## 2014-06-13 NOTE — Telephone Encounter (Signed)
I called and spoke with the patient and advised her she can take benadryl if she has no allregies to this medication. She voices understanding.

## 2014-09-22 ENCOUNTER — Ambulatory Visit (INDEPENDENT_AMBULATORY_CARE_PROVIDER_SITE_OTHER): Payer: Medicare Other | Admitting: Interventional Cardiology

## 2014-09-22 ENCOUNTER — Encounter: Payer: Self-pay | Admitting: Interventional Cardiology

## 2014-09-22 ENCOUNTER — Ambulatory Visit: Payer: Medicare Other | Admitting: Pharmacist

## 2014-09-22 VITALS — BP 115/82 | HR 49 | Ht 65.0 in | Wt 286.8 lb

## 2014-09-22 DIAGNOSIS — I482 Chronic atrial fibrillation, unspecified: Secondary | ICD-10-CM

## 2014-09-22 DIAGNOSIS — I2510 Atherosclerotic heart disease of native coronary artery without angina pectoris: Secondary | ICD-10-CM

## 2014-09-22 DIAGNOSIS — I4891 Unspecified atrial fibrillation: Secondary | ICD-10-CM

## 2014-09-22 DIAGNOSIS — E785 Hyperlipidemia, unspecified: Secondary | ICD-10-CM

## 2014-09-22 MED ORDER — ATENOLOL 25 MG PO TABS: 25.0000 mg | ORAL_TABLET | Freq: Every day | ORAL | Status: DC

## 2014-09-22 NOTE — Progress Notes (Signed)
Patient ID: [name], female   DOB: 07/27/1935, 79 y.o.   MRN: [id number]     Cardiology Office Note   Date:  09/22/2014   ID:  [name], DOB 01-19-1936, MRN [id number]  PCP:  Lottie Dawson, MD    No chief complaint on file. f/u AFib   Wt Readings from Last 3 Encounters:  09/22/14 286 lb 12.8 oz (130.092 kg)  04/05/14 293 lb 12.8 oz (133.267 kg)  10/05/13 288 lb 6.4 oz (130.817 kg)       History of Present Illness: [name] is a 79 y.o. female   who had AFib and CAD. She has had Breast cancer in the recent past. Atrial Fibrillation F/U:  Denies : Chest pain.  Dizziness.  Leg edema.  Orthopnea.  Palpitations.  Syncope  No sx lke what she had before stent.  She had an RCA stent placed in 2000. I personally reviewed her cath report from January 2001. At that time, she had repeat angiography which showed mild to moderate coronary disease in her left system. She had mild in-stent restenosis of the RCA stent.  She was told to decrease her atenolol last year.  She was scared to do this but did not change the dose.  She did not have any lightheadedness or syncope.  SHe is willing to try to decrease the dose again.  She will watch for sx of tachycardia.    Past Medical History  Diagnosis Date  . Anxiety   . Allergy   . Asthma   . 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 Incontinence of urine   . Plantar fasciitis   . Breast cancer ([place]) Receptor + her2 _ 12/26/2010    left  . Gout     post operative  . Atrial fibrillation 01/21/11  . Malignant neoplasm of upper-outer quadrant of female breast 12/26/2010  . Hx of radiation therapy 04/02/11 -05/20/11    left breast    Past Surgical History  Procedure Laterality Date  . Tonsillectomy  1955  . Cholecystectomy  1955  . Hand surgery  1992    tendons between thumb and forefinger  . Coronary angioplasty with stent placement  2000  . Skin biopsy      1994, 2006, 2008, 2010, 2011, 2011, 2012-  pre-cancerous   . Dilation and curettage of uterus  1976    and 1996  . Breast lumpectomy w/ needle localization  01/29/2011    Left with [facility] Dr Margot Chimes     Current Outpatient Prescriptions  Medication Sig Dispense Refill  . albuterol (PROVENTIL HFA;VENTOLIN HFA) 108 (90 BASE) MCG/ACT inhaler Inhale 2 puffs into the lungs as needed.      Marland Kitchen alendronate (FOSAMAX) 70 MG tablet Take 70 mg by mouth once a week. Patient takes on Saturday    . allopurinol (ZYLOPRIM) 100 MG tablet Take 300 mg by mouth daily.     Marland Kitchen ALPRAZolam (XANAX) 0.5 MG tablet Take 0.5 mg by mouth 2 (two) times daily as needed.      Marland Kitchen atenolol (TENORMIN) 50 MG tablet Take 0.5 tablets (25 mg total) by mouth daily.    . Biotin 5000 MCG CAPS Take by mouth.    . cholecalciferol (VITAMIN D) 1000 UNITS tablet Take 1,000 Units by mouth daily.    . ciprofloxacin (CIPRO) 500 MG tablet   0  . co-enzyme Q-10 50 MG capsule Take 400 mg by mouth daily.    Marland Kitchen  colchicine (COLCRYS) 0.6 MG tablet Take 0.6 mg by mouth daily.    . fexofenadine (ALLEGRA) 180 MG tablet Take 180 mg by mouth daily.      . fluconazole (DIFLUCAN) 100 MG tablet Take as directed 30 tablet 3  . FLUoxetine (PROZAC) 20 MG capsule Take 20 mg by mouth daily.      . fluticasone (FLONASE) 50 MCG/ACT nasal spray     . Fluticasone-Salmeterol (ADVAIR) 500-50 MCG/DOSE AEPB Inhale 1 puff into the lungs every 12 (twelve) hours.      . hydrochlorothiazide 25 MG tablet Take 50 mg by mouth once.      Marland Kitchen KLOR-CON M20 20 MEQ tablet     . letrozole (FEMARA) 2.5 MG tablet Take 1 tablet (2.5 mg total) by mouth daily. 90 tablet 1  . levothyroxine (SYNTHROID, LEVOTHROID) 50 MCG tablet Take 50 mcg by mouth daily.      . montelukast (SINGULAIR) 10 MG tablet Take 10 mg by mouth at bedtime.      Marland Kitchen nystatin cream (MYCOSTATIN)   0  . predniSONE (DELTASONE) 5 MG tablet Take 5 mg by mouth daily as needed.     . rivaroxaban (XARELTO) 20 MG TABS tablet Take 1 tablet (20 mg total) by mouth daily. 90 tablet 3  .  rosuvastatin (CRESTOR) 10 MG tablet Take 10 mg by mouth. As of 02/18/2013, taking 2 x weekly    . traMADol (ULTRAM) 50 MG tablet     . vitamin C (ASCORBIC ACID) 500 MG tablet Take 500 mg by mouth daily.     No current facility-administered medications for this visit.    Allergies:   Contrast media; Crestor; Lipitor; Vicodin; Codeine; Quinine derivatives; Sulfa antibiotics; Epinephrine; Penicillins; and Theophyllines    Social History:  The patient  reports that she quit smoking about 51 years ago. She has never used smokeless tobacco. She reports that she does not drink alcohol or use illicit drugs.   Family History:  The patient's *family history includes Asthma in her sister; Cancer in her father; Heart disease in her brother and father.    ROS:  Please see the history of present illness.   Otherwise, review of systems are positive for knee pain, palpitations.   All other systems are reviewed and negative.    PHYSICAL EXAM: VS:  BP 115/82 mmHg  Pulse 49  Ht $R'5\' 5"'WU$  (1.651 m)  Wt 286 lb 12.8 oz (130.092 kg)  BMI 47.73 kg/m2 , BMI Body mass index is 47.73 kg/(m^2). GEN: Well nourished, well developed, in no acute distress HEENT: normal Neck: no JVD, carotid bruits, or masses Cardiac: Bradycardic, irregular; no murmurs, rubs, or gallops,no edema  Respiratory:  clear to auscultation bilaterally, normal work of breathing GI: soft, nontender, nondistended, + BS MS: no deformity or atrophy Skin: warm and dry, no rash, skin changes in LE c/w venous insufficiency; tr edema bilaterally Neuro:  Strength and sensation are intact Psych: euthymic mood, full affect   EKG:   The ekg ordered today demonstrates atrial fibrillation with slow ventricular response   Recent Labs: 04/04/2014: ALT 22; BUN 19.6; Creatinine 0.8; Hemoglobin 12.7; Platelets 133*; Potassium 3.8; Sodium 143   Lipid Panel    Component Value Date/Time   CHOL 209* 01/27/2014 0931   TRIG 104.0 01/27/2014 0931   HDL  57.30 01/27/2014 0931   CHOLHDL 4 01/27/2014 0931   VLDL 20.8 01/27/2014 0931   LDLCALC 131* 01/27/2014 0931     Other studies Reviewed: Additional studies/ records that  were reviewed today with results demonstrating: cath from 1/01.   ASSESSMENT AND PLAN:  A-fib  Decrease Atenolol Tablet, 25 MG, take 1 tablet by mouth once daily due to bradycardia.  SHe will try this now. Continue Xarelto 20 mg tablet, 20 mg, 1 tablet, orally, once a day in the evening with food Notes: Rate controlled. Xarelto for stroke prevention. No bleeding problems.    2. Coronary atherosclerosis of native coronary artery        Notes: No angina. Prior RCA stent placed. Continue aggressive secondary prevention. She'll also try to lose weight.    3. Combined hyperlipidemia  Continue Crestor Tablet, 10 MG, 1 tablet, Orally, twice a week, 30 day(s), 30 Decrease Vitamin D (Ergocalciferol) Capsule, 2000 UNIT, 1 capsule, Orally, Once a day  Notes: LDL over 200 in the past; now 141 in 4/15. She has been intolerant of statins.Would titrate Crestor 10 mg twice a week.could also try coenzyme Q10. Vitamin D level was at 33 in 2015. She will take Vit D 1000U daily. [facility].  SHe is interested in PCSK9 inhibito.   4. Others  Notes: needs knee replacement. Needs to get to 260 lbs.  Obesity: She has lost 60 lbs in the past few years.    Preventive Medicine  Adult topics discussed:  Diet: healthy diet, low calorie, low fat.  Exercise: 5 days a week, at least 30 minutes of aerobic exercise.             Current medicines are reviewed at length with the patient today.  The patient concerns regarding her medicines were addressed.  The following changes have been made:  Decrease atenolol  Labs/ tests ordered today include: lipids No orders of the defined types were placed in this encounter.    Recommend 150 minutes/week of aerobic exercise Low fat, low carb, high fiber diet  recommended  Disposition:   FU in    Teresita Madura., MD  09/22/2014 9:11 AM    [facility] [address], [place], [place]  [id number] Phone: [phone]; Fax: [phone]

## 2014-09-22 NOTE — Addendum Note (Signed)
Addended by: Lamar Laundry on: 09/22/2014 10:14 AM   Modules accepted: Orders

## 2014-09-22 NOTE — Patient Instructions (Signed)
Medication Instructions:  Your physician has recommended you make the following change in your medication:  1) DECREASE Atenolol to 25mg  daily  Labwork: Your physician recommends that you return for a FASTING lipid profile and lft asap  Testing/Procedures: None   Follow-Up: Your physician recommends that you schedule a follow-up appointment 1-2 days after labs with Lavetta Nielsen., Pharm-D in the lipid clinic  Your physician wants you to follow-up in: 1 year with Dr.Varanasi You will receive a reminder letter in the mail two months in advance. If you don't receive a letter, please call our office to schedule the follow-up appointment.    Any Other Special Instructions Will Be Listed Below (If Applicable).

## 2014-09-23 ENCOUNTER — Other Ambulatory Visit (INDEPENDENT_AMBULATORY_CARE_PROVIDER_SITE_OTHER): Payer: Medicare Other | Admitting: *Deleted

## 2014-09-23 ENCOUNTER — Telehealth: Payer: Self-pay | Admitting: *Deleted

## 2014-09-23 DIAGNOSIS — E785 Hyperlipidemia, unspecified: Secondary | ICD-10-CM | POA: Diagnosis not present

## 2014-09-23 LAB — HEPATIC FUNCTION PANEL
ALBUMIN: 4.2 g/dL (ref 3.5–5.2)
ALT: 21 U/L (ref 0–35)
AST: 27 U/L (ref 0–37)
Alkaline Phosphatase: 65 U/L (ref 39–117)
BILIRUBIN TOTAL: 1.1 mg/dL (ref 0.2–1.2)
Bilirubin, Direct: 0.2 mg/dL (ref 0.0–0.3)
Total Protein: 7 g/dL (ref 6.0–8.3)

## 2014-09-23 LAB — LIPID PANEL
CHOL/HDL RATIO: 4
Cholesterol: 198 mg/dL (ref 0–200)
HDL: 54 mg/dL (ref >39.00)
LDL Cholesterol: 125 mg/dL — ABNORMAL HIGH (ref 0–99)
NonHDL: 144
Triglycerides: 95 mg/dL (ref 0.0–149.0)
VLDL: 19 mg/dL (ref 0.0–40.0)

## 2014-09-23 NOTE — Telephone Encounter (Signed)
VM from pt regarding appt she has scheduled for 10/11/14. She was wondering if she had enough time between her lab appt and her appt with Heather. Call back to pt to assure her there is adequate time between appts.

## 2014-09-23 NOTE — Addendum Note (Signed)
Addended by: Eulis Foster on: 09/23/2014 08:06 AM   Modules accepted: Orders

## 2014-10-04 ENCOUNTER — Other Ambulatory Visit: Payer: Self-pay

## 2014-10-04 MED ORDER — RIVAROXABAN 20 MG PO TABS
20.0000 mg | ORAL_TABLET | Freq: Every day | ORAL | Status: DC
Start: 2014-10-04 — End: 2015-11-02

## 2014-10-07 ENCOUNTER — Ambulatory Visit (INDEPENDENT_AMBULATORY_CARE_PROVIDER_SITE_OTHER): Payer: Medicare Other | Admitting: Pharmacist

## 2014-10-07 DIAGNOSIS — E785 Hyperlipidemia, unspecified: Secondary | ICD-10-CM

## 2014-10-07 NOTE — Patient Instructions (Signed)
Start Zetia 1/2 tablet every day.  If you have any problems, please call Gay Filler at [phone].   Continue Crestor twice a week.   We will recheck your labs in 3 months.

## 2014-10-07 NOTE — Progress Notes (Signed)
Patient is a pleasant 79 y.o WF pt of Dr. Irish Lack being seen today for follow up of cholesterol and to discuss possible PCSK-9 inhibitors.  She has a PMH significant for CAD s/p PCI to the RCA in 2000.  She has been unable tolerate multiple statins in the past due to muscle aches and elevated LFTs.  Her baseline LDL is > 200 mg/dL, and this is now down to 125 mg/dL on Crestor 10 mg twice weekly, and she is tolerating this well.  She lost 80 lbs over the past year through diet and is maintaining.   She has cut out red meat, excessive carbs, fatty foods, desserts, high salt fats, and soda.    LDL goal < 100 mg/dL given CAD and baseline LDL > 200 mg/dL Meds:  Crestor 10 mg twice weekly. Failed multiple statins in past due to myalgias and elevated LFTs.  Tolerating Crestor twice weekly well, and ALT normal today.  Family history:  Father had MI at 30 y.o., and brother had MI at 56 y.o.  Social history:  Doesn't drink alochol.  She is a former smoker (no longer smoking).  Labs:   09/2014: TC 198, TG 95, HDL 54, LDL 125, LFTs normal (Crestor 10mg  BIW) 01/2014: TC 209, TG 104, HDL 57, LDL 131 07/2013:  TC 223, TG 156, HDL 51, LDL 141, ALT normal (Crestor 10 mg biw) Baseline LDL is > 200 mg/dL  Current Outpatient Prescriptions  Medication Sig Dispense Refill  . albuterol (PROVENTIL HFA;VENTOLIN HFA) 108 (90 BASE) MCG/ACT inhaler Inhale 2 puffs into the lungs as needed.      Marland Kitchen alendronate (FOSAMAX) 70 MG tablet Take 70 mg by mouth once a week. Patient takes on Saturday    . allopurinol (ZYLOPRIM) 100 MG tablet Take 300 mg by mouth daily.     Marland Kitchen ALPRAZolam (XANAX) 0.5 MG tablet Take 0.5 mg by mouth 2 (two) times daily as needed.      Marland Kitchen atenolol (TENORMIN) 25 MG tablet Take 1 tablet (25 mg total) by mouth daily.    . Biotin 5000 MCG CAPS Take 1 capsule by mouth.     . cholecalciferol (VITAMIN D) 1000 UNITS tablet Take 1,000 Units by mouth daily.    . ciprofloxacin (CIPRO) 500 MG tablet   0  .  co-enzyme Q-10 50 MG capsule Take 400 mg by mouth daily.    . colchicine (COLCRYS) 0.6 MG tablet Take 0.6 mg by mouth daily.    . fexofenadine (ALLEGRA) 180 MG tablet Take 180 mg by mouth daily.      . fluconazole (DIFLUCAN) 100 MG tablet Take as directed 30 tablet 3  . FLUoxetine (PROZAC) 20 MG capsule Take 20 mg by mouth daily.      . fluticasone (FLONASE) 50 MCG/ACT nasal spray     . Fluticasone-Salmeterol (ADVAIR) 500-50 MCG/DOSE AEPB Inhale 1 puff into the lungs every 12 (twelve) hours.      . hydrochlorothiazide 25 MG tablet Take 50 mg by mouth once.      Marland Kitchen KLOR-CON M20 20 MEQ tablet     . letrozole (FEMARA) 2.5 MG tablet Take 1 tablet (2.5 mg total) by mouth daily. 90 tablet 1  . levothyroxine (SYNTHROID, LEVOTHROID) 50 MCG tablet Take 50 mcg by mouth daily.      . montelukast (SINGULAIR) 10 MG tablet Take 10 mg by mouth at bedtime.      Marland Kitchen nystatin cream (MYCOSTATIN) Apply 1 application topically 3 (three) times daily.  0  . predniSONE (DELTASONE) 5 MG tablet Take 5 mg by mouth daily as needed.     . rivaroxaban (XARELTO) 20 MG TABS tablet Take 1 tablet (20 mg total) by mouth daily. 90 tablet 3  . rosuvastatin (CRESTOR) 10 MG tablet Take 10 mg by mouth. As of 02/18/2013, taking 2 x weekly    . traMADol (ULTRAM) 50 MG tablet Take 50 mg by mouth every 12 (twelve) hours as needed for moderate pain.     . vitamin C (ASCORBIC ACID) 500 MG tablet Take 500 mg by mouth daily.     No current facility-administered medications for this visit.   Allergies  Allergen Reactions  . Contrast Media [Iodinated Diagnostic Agents] Nausea And Vomiting    IV dye for MRI  . Crestor [Rosuvastatin Calcium]     Severe liver function problems  . Lipitor [Atorvastatin Calcium] Other (See Comments)    Elevated liver function   . Vicodin [Hydrocodone-Acetaminophen] Itching    Itching all over  . Codeine Nausea Only and Anxiety    Also complained of dizziness.  Has same problems with oxycodone and  hydrocodone  . Quinine Derivatives Nausea Only    dizzy  . Sulfa Antibiotics Nausea Only    Also complained of dizziness  . Epinephrine Other (See Comments)    Patient becomes "shaky"  . Penicillins Rash    Pt reported all over body rash in 1958  . Theophyllines Other (See Comments)    Patient becomes "shaky"   Family History  Problem Relation Age of Onset  . Heart disease Father   . Cancer Father     intestinal polyps  . Asthma Sister   . Heart disease Brother    Assessment and Plan 1.  Hyperlipidemia-  Pt is tolerating Crestor with no issues and LFTs are normal.  She is still slightly above her LDL target.  She has never tried Zetia in the past.  Will add Zetia to her regimen.  She is concerned over the cost of her medications as she is in the donut hole.  Will have her take 1/2 tablet of the zetia as there is similar efficacy and this will help with expense.  If she is unable to tolerate the Zetia, will consider PCSK-9 at that time.  Pt is agreeable and will recheck labs in 3 months.

## 2014-10-09 MED ORDER — EZETIMIBE 10 MG PO TABS: 5.0000 mg | ORAL_TABLET | Freq: Every day | ORAL | Status: DC

## 2014-10-10 ENCOUNTER — Other Ambulatory Visit: Payer: Self-pay | Admitting: Nurse Practitioner

## 2014-10-10 ENCOUNTER — Ambulatory Visit (INDEPENDENT_AMBULATORY_CARE_PROVIDER_SITE_OTHER): Payer: Medicare Other | Admitting: Ophthalmology

## 2014-10-10 DIAGNOSIS — C50412 Malignant neoplasm of upper-outer quadrant of left female breast: Secondary | ICD-10-CM

## 2014-10-11 ENCOUNTER — Encounter: Payer: Self-pay | Admitting: Nurse Practitioner

## 2014-10-11 ENCOUNTER — Ambulatory Visit (HOSPITAL_BASED_OUTPATIENT_CLINIC_OR_DEPARTMENT_OTHER): Payer: Medicare Other | Admitting: Nurse Practitioner

## 2014-10-11 ENCOUNTER — Other Ambulatory Visit: Payer: Self-pay | Admitting: *Deleted

## 2014-10-11 ENCOUNTER — Telehealth: Payer: Self-pay | Admitting: Oncology

## 2014-10-11 ENCOUNTER — Telehealth: Payer: Self-pay | Admitting: General Practice

## 2014-10-11 ENCOUNTER — Other Ambulatory Visit (HOSPITAL_BASED_OUTPATIENT_CLINIC_OR_DEPARTMENT_OTHER): Payer: Medicare Other

## 2014-10-11 VITALS — BP 160/82 | HR 53 | Temp 97.6°F | Resp 18 | Ht 65.0 in | Wt 287.6 lb

## 2014-10-11 DIAGNOSIS — M25519 Pain in unspecified shoulder: Secondary | ICD-10-CM

## 2014-10-11 DIAGNOSIS — C50412 Malignant neoplasm of upper-outer quadrant of left female breast: Secondary | ICD-10-CM | POA: Diagnosis not present

## 2014-10-11 DIAGNOSIS — E876 Hypokalemia: Secondary | ICD-10-CM

## 2014-10-11 DIAGNOSIS — M25569 Pain in unspecified knee: Secondary | ICD-10-CM

## 2014-10-11 DIAGNOSIS — Z170 Estrogen receptor positive status [ER+]: Secondary | ICD-10-CM | POA: Diagnosis not present

## 2014-10-11 DIAGNOSIS — I4891 Unspecified atrial fibrillation: Secondary | ICD-10-CM

## 2014-10-11 DIAGNOSIS — C50419 Malignant neoplasm of upper-outer quadrant of unspecified female breast: Secondary | ICD-10-CM

## 2014-10-11 DIAGNOSIS — G4730 Sleep apnea, unspecified: Secondary | ICD-10-CM

## 2014-10-11 LAB — COMPREHENSIVE METABOLIC PANEL (CC13)
ALBUMIN: 3.7 g/dL (ref 3.5–5.0)
ALT: 22 U/L (ref 0–55)
AST: 26 U/L (ref 5–34)
Alkaline Phosphatase: 70 U/L (ref 40–150)
Anion Gap: 9 mEq/L (ref 3–11)
BILIRUBIN TOTAL: 1.37 mg/dL — AB (ref 0.20–1.20)
BUN: 15.2 mg/dL (ref 7.0–26.0)
CO2: 28 mEq/L (ref 22–29)
Calcium: 9.6 mg/dL (ref 8.4–10.4)
Chloride: 105 mEq/L (ref 98–109)
Creatinine: 0.8 mg/dL (ref 0.6–1.1)
EGFR: 72 mL/min/{1.73_m2} — ABNORMAL LOW (ref >90)
Glucose: 147 mg/dl — ABNORMAL HIGH (ref 70–140)
POTASSIUM: 3.5 meq/L (ref 3.5–5.1)
Sodium: 142 mEq/L (ref 136–145)
TOTAL PROTEIN: 6.7 g/dL (ref 6.4–8.3)

## 2014-10-11 LAB — CBC WITH DIFFERENTIAL/PLATELET
BASO%: 0.2 % (ref 0.0–2.0)
Basophils Absolute: 0 10*3/uL (ref 0.0–0.1)
EOS%: 5.1 % (ref 0.0–7.0)
Eosinophils Absolute: 0.3 10*3/uL (ref 0.0–0.5)
HCT: 43.3 % (ref 34.8–46.6)
HGB: 14.2 g/dL (ref 11.6–15.9)
LYMPH%: 23.1 % (ref 14.0–49.7)
MCH: 31.9 pg (ref 25.1–34.0)
MCHC: 32.8 g/dL (ref 31.5–36.0)
MCV: 97.3 fL (ref 79.5–101.0)
MONO#: 0.5 10*3/uL (ref 0.1–0.9)
MONO%: 8.3 % (ref 0.0–14.0)
NEUT%: 63.3 % (ref 38.4–76.8)
NEUTROS ABS: 3.7 10*3/uL (ref 1.5–6.5)
Platelets: 130 10*3/uL — ABNORMAL LOW (ref 145–400)
RBC: 4.45 10*6/uL (ref 3.70–5.45)
RDW: 14.5 % (ref 11.2–14.5)
WBC: 5.9 10*3/uL (ref 3.9–10.3)
lymph#: 1.4 10*3/uL (ref 0.9–3.3)

## 2014-10-11 MED ORDER — LETROZOLE 2.5 MG PO TABS: 2.5000 mg | ORAL_TABLET | Freq: Every day | ORAL | Status: DC

## 2014-10-11 NOTE — Telephone Encounter (Signed)
Appointments made and avs printed for patient °

## 2014-10-11 NOTE — Progress Notes (Signed)
ID: [name]   DOB: [birth year]  MR#: [id number]  CSN#:[id number]  PCP: Lottie Dawson, MD GYN: SU: Christian Streck/[facility]bert Batman OTHER MD: Nena Polio, [place] Nakayama, [place], [place] COMPLAINT:  Left Breast Cancer  CURRENT TREATMENT: Letrozole  BREAST CANCER HISTORY: From the original intake note:  Ms. Dillingham had an unremarkable screening mammogram August of 2011.  On 12/18/2010 however a potential abnormality was noted in the left breast and she was recalled for additional views 08/16.  Dr. Marcelo Baldy was able to demonstrate a focal irregular density in the upper outer quadrant of the left breast associated with linear microcalcifications.  By ultrasound a 9 mm hypoechoic mass was noted at the corresponding area.  This warranted biopsy and a biopsy was performed under ultrasound guidance 08/21.  The pathology showed ([id number]) an invasive lobular carcinoma, E cadherin negative, associated with ductal carcinoma in situ.  The invasive lobular carcinoma was HER-2 negative with a ratio by CISH of 1.23.  Estrogen receptor was 100% positive, progesterone receptor was 100% positive and the proliferation marker was 14%.  With this information the patient was referred to Dr. Margot Chimes and bilateral breast MRIs were obtained 12/28/2010.  In the upper outer quadrant of the left breast there was a small irregular nodule measuring 1.2 cm maximally.  There were no other suspicious abnormalities in the left breast or in the right breast.  There were no suspicious internal mammary or axillary lymph nodes identified.  With this information the patient is referred for further evaluation and treatment.  Further treatment is as detailed below.  INTERVAL HISTORY: Aveena returns today for follow up of her breast cancer, accompanied by her husband [name]. She has been on letrozole since February 2013 and is tolerating this drug well with no complaints. She states that besides taking the pill,  she can't even tell that she's on it. She denies hot flashes, vaginal changes, or increased arthralgias/myalgias. The interval history is generally unremarkable.  REVIEW OF SYSTEMS Evanie denies fevers, chills, nausea, vomiting, or changes in bowel or bladder habits. She continues to try to lose weight to be eligible for a knee replacement. She is down another 5lb since her last visit 6 months ago. Besides knee pain, she has shoulder pain from relying on her walker so hard to ambulate. She has a history of asthma and shortness of breath with exertion, but denies chest pain, cough, or palpitations. She has a history of afib, but this has been asymptomatic lately. She has cut her atenolol dose in half. She continues to use diflucan PRN for the yeast rashes that appear to her groin, axilla, and inframammary areas. A detailed review of systems is otherwise stable.   PAST MEDICAL HISTORY: Past Medical History  Diagnosis Date  . Anxiety   . Allergy   . Asthma   . 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 Incontinence of urine   . Plantar fasciitis   . Breast cancer ([place]) Receptor + her2 _ 12/26/2010    left  . Gout     post operative  . Atrial fibrillation 01/21/11  . Malignant neoplasm of upper-outer quadrant of female breast 12/26/2010  . Hx of radiation therapy 04/02/11 -05/20/11    left breast  The past medical history is significant for hypertension, asthma, hypothyroidism, osteoarthritis/degenerative disk disease with particular problems with her right knee, history of depression, history of paresthesias, hypercholesterolemia, allergic rhinitis, osteopenia, status post tonsillectomy, status post cholecystectomy, status post  right hand surgery, history of fractures to the arms and legs, history of coronary artery disease status post stenting of her right coronary with a good cath report in 2001.  She is status post D and C and cryosurgery.  PAST SURGICAL HISTORY: Past Surgical History  Procedure  Laterality Date  . Tonsillectomy  1955  . Cholecystectomy  1955  . Hand surgery  1992    tendons between thumb and forefinger  . Coronary angioplasty with stent placement  2000  . Skin biopsy      1994, 2006, 2008, 2010, 2011, 2011, 2012-  pre-cancerous  . Dilation and curettage of uterus  1976    and 1996  . Breast lumpectomy w/ needle localization  01/29/2011    Left with [facility] Dr Margot Chimes    FAMILY HISTORY Family History  Problem Relation Age of Onset  . Heart disease Father   . Cancer Father     intestinal polyps  . Asthma Sister   . Heart disease Brother   The patient's father had surgery for cancerous polyps in the colon at age 26.  He died at age 65 from a heart attack.  The patient's mother died at age 61.  The patient has 1 brother alive at age 32 and in good health.  She has 1 sister also in good health.  There is no family history of breast or ovarian cancer.  GYNECOLOGIC HISTORY: She had menarche at age 89, last period in 82.  She took hormone replacement for less than a year, stopping in 1996.  She carried 3 children to term, the first one at age 87.  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  She is also an active Nurse, adult.  She is now retired.  Her husband, Jennessy Sandridge, is present today as are her daughters, Yokum and [name].  Zehava Turski is 70, lives here in [place] and is a wound treatment specialist.  [name] also lives in [place] and is an Optometrist.  Son, Aniqua Briere, died at the age of 75 from what seems to have been a heart attack.  The patient has 2 grandchildren.   ADVANCED DIRECTIVES: In place  HEALTH MAINTENANCE: (Updated 02/18/2013) History  Substance Use Topics  . Smoking status: Former Smoker -- 1.00 packs/day    Quit date: 12/05/1962  . Smokeless tobacco: Never Used  . Alcohol Use: No     Colonoscopy:  PAP:  Bone density: Not on file  Lipid panel: Sept  2014, Dr. Irish Lack    Allergies  Allergen Reactions  . Contrast Media [Iodinated Diagnostic Agents] Nausea And Vomiting    IV dye for MRI  . Crestor [Rosuvastatin Calcium]     Severe liver function problems  . Lipitor [Atorvastatin Calcium] Other (See Comments)    Elevated liver function   . Vicodin [Hydrocodone-Acetaminophen] Itching    Itching all over  . Codeine Nausea Only and Anxiety    Also complained of dizziness.  Has same problems with oxycodone and hydrocodone  . Quinine Derivatives Nausea Only    dizzy  . Sulfa Antibiotics Nausea Only    Also complained of dizziness  . Epinephrine Other (See Comments)    Patient becomes "shaky"  . Penicillins Rash    Pt reported all over body rash in 1958  . Theophyllines Other (See Comments)    Patient becomes "shaky"    Current Outpatient Prescriptions  Medication Sig Dispense  Refill  . albuterol (PROVENTIL HFA;VENTOLIN HFA) 108 (90 BASE) MCG/ACT inhaler Inhale 2 puffs into the lungs as needed.      Marland Kitchen alendronate (FOSAMAX) 70 MG tablet Take 70 mg by mouth once a week. Patient takes on Saturday    . allopurinol (ZYLOPRIM) 100 MG tablet Take 300 mg by mouth daily.     Marland Kitchen ALPRAZolam (XANAX) 0.5 MG tablet Take 0.5 mg by mouth 2 (two) times daily as needed.      Marland Kitchen atenolol (TENORMIN) 25 MG tablet Take 1 tablet (25 mg total) by mouth daily.    . Biotin 5000 MCG CAPS Take 1 capsule by mouth.     . cholecalciferol (VITAMIN D) 1000 UNITS tablet Take 1,000 Units by mouth daily.    Marland Kitchen co-enzyme Q-10 50 MG capsule Take 400 mg by mouth daily.    . colchicine (COLCRYS) 0.6 MG tablet Take 0.6 mg by mouth daily.    Marland Kitchen ezetimibe (ZETIA) 10 MG tablet Take 0.5-1 tablets (5-10 mg total) by mouth daily. 90 tablet 3  . fexofenadine (ALLEGRA) 180 MG tablet Take 180 mg by mouth daily.      . fluconazole (DIFLUCAN) 100 MG tablet Take as directed 30 tablet 3  . FLUoxetine (PROZAC) 20 MG capsule Take 20 mg by mouth daily.      . fluticasone (FLONASE) 50  MCG/ACT nasal spray     . Fluticasone-Salmeterol (ADVAIR) 500-50 MCG/DOSE AEPB Inhale 1 puff into the lungs every 12 (twelve) hours.      . hydrochlorothiazide 25 MG tablet Take 50 mg by mouth once.      Marland Kitchen KLOR-CON M20 20 MEQ tablet     . levothyroxine (SYNTHROID, LEVOTHROID) 50 MCG tablet Take 50 mcg by mouth daily.      . montelukast (SINGULAIR) 10 MG tablet Take 10 mg by mouth at bedtime.      Marland Kitchen nystatin cream (MYCOSTATIN) Apply 1 application topically 3 (three) times daily.   0  . predniSONE (DELTASONE) 5 MG tablet Take 5 mg by mouth daily as needed.     . rivaroxaban (XARELTO) 20 MG TABS tablet Take 1 tablet (20 mg total) by mouth daily. 90 tablet 3  . rosuvastatin (CRESTOR) 10 MG tablet Take 10 mg by mouth. As of 02/18/2013, taking 2 x weekly    . traMADol (ULTRAM) 50 MG tablet Take 50 mg by mouth every 12 (twelve) hours as needed for moderate pain.     . vitamin C (ASCORBIC ACID) 500 MG tablet Take 500 mg by mouth daily.    Marland Kitchen letrozole (FEMARA) 2.5 MG tablet Take 1 tablet (2.5 mg total) by mouth daily. 90 tablet 2   No current facility-administered medications for this visit.    OBJECTIVE: Elderly white woman examined in a wheelchair Filed Vitals:   10/11/14 1336  BP: 160/82  Pulse: 53  Temp: 97.6 F (36.4 C)  Resp: 18     Body mass index is 47.86 kg/(m^2).    ECOG FS: 2 Filed Weights   10/11/14 1336  Weight: 287 lb 9.6 oz (130.455 kg)   Skin: warm, dry  HEENT: sclerae anicteric, conjunctivae pink, oropharynx clear. No thrush or mucositis.  Lymph Nodes: No cervical or supraclavicular lymphadenopathy  Lungs: clear to auscultation bilaterally, no rales, wheezes, or rhonci  Heart: regular rate and rhythm  Abdomen: obsese, soft, non tender, positive bowel sounds  Musculoskeletal: No focal spinal tenderness, no peripheral edema  Neuro: non focal, well oriented, positive affect  Breasts: left breast  status post lumpectomy and radiation. No evidence of recurrent disease. Left  axilla benign. Right breast unremarkable.   LAB RESULTS: Lab Results  Component Value Date   WBC 5.9 10/11/2014   NEUTROABS 3.7 10/11/2014   HGB 14.2 10/11/2014   HCT 43.3 10/11/2014   MCV 97.3 10/11/2014   PLT 130* 10/11/2014      Chemistry      Component Value Date/Time   NA 142 10/11/2014 1308   NA 141 07/22/2011 0957   K 3.5 10/11/2014 1308   K 3.6 07/22/2011 0957   CL 105 07/15/2012 1409   CL 104 07/22/2011 0957   CO2 28 10/11/2014 1308   CO2 25 07/22/2011 0957   BUN 15.2 10/11/2014 1308   BUN 15 07/22/2011 0957   CREATININE 0.8 10/11/2014 1308   CREATININE 0.87 07/22/2011 0957      Component Value Date/Time   CALCIUM 9.6 10/11/2014 1308   CALCIUM 9.5 07/22/2011 0957   ALKPHOS 70 10/11/2014 1308   ALKPHOS 65 09/23/2014 0816   AST 26 10/11/2014 1308   AST 27 09/23/2014 0816   ALT 22 10/11/2014 1308   ALT 21 09/23/2014 0816   BILITOT 1.37* 10/11/2014 1308   BILITOT 1.1 09/23/2014 0816       Lab Results  Component Value Date   LABCA2 17 07/15/2012   STUDIES: No results found.  ASSESSMENT: 79 y.o.  [place] woman   (1)  status post left lumpectomy and sentinel lymph node sampling 01/29/2011 for a T1BN0 (stage I) invasive lobular breast cancer, grade 2, estrogen receptor 100% positive, progesterone receptor 100% positive, with no HER-2 amplification, and then MIB-1 of 14%;   (2)  s/p radiation completed January 2013,   (3)  on letrozole as of February 2013, the plan being to continue for total of 5 years (to February 2018)  (4)  mild hypokalemia   (5)  possible lymphadenopathy in the left posterior cervical chain--no significant finding on [place] October 2014  (6) morbid obesity  (7) sleep apnea   PLAN:  Arnell is doing well as far as her breast cancer is concerned. She is now almost 4 years out from her definitive surgery with no evidence of recurrent disease. She is tolerating the letrozole well with no complaints and will continue this drug until  February 2018 to complete 5 years of antiestrogen thearpy.   Mike will return in 1 year for labs and af follow up visit. She understands and agrees with this plan. She knows the goal of treatment in her case is cure. She has been encouraged to call with any issues that might arise before her next visit here.    Laurie Panda, NP   10/11/2014

## 2014-10-13 ENCOUNTER — Other Ambulatory Visit: Payer: Self-pay

## 2014-10-13 MED ORDER — ROSUVASTATIN CALCIUM 10 MG PO TABS: 10.0000 mg | ORAL_TABLET | ORAL | Status: DC

## 2014-10-13 NOTE — Telephone Encounter (Signed)
Per note 6.3.16 

## 2014-11-03 ENCOUNTER — Other Ambulatory Visit: Payer: Self-pay

## 2014-11-03 MED ORDER — FLUCONAZOLE 100 MG PO TABS: ORAL_TABLET | ORAL | Status: DC

## 2014-11-11 ENCOUNTER — Ambulatory Visit (INDEPENDENT_AMBULATORY_CARE_PROVIDER_SITE_OTHER): Payer: Self-pay | Admitting: Ophthalmology

## 2014-12-19 ENCOUNTER — Ambulatory Visit (INDEPENDENT_AMBULATORY_CARE_PROVIDER_SITE_OTHER): Payer: Self-pay | Admitting: Ophthalmology

## 2015-01-18 ENCOUNTER — Ambulatory Visit (INDEPENDENT_AMBULATORY_CARE_PROVIDER_SITE_OTHER): Payer: Self-pay | Admitting: Ophthalmology

## 2015-01-19 ENCOUNTER — Other Ambulatory Visit: Payer: Medicare Other

## 2015-03-06 ENCOUNTER — Encounter (HOSPITAL_COMMUNITY): Payer: Self-pay | Admitting: Emergency Medicine

## 2015-03-06 ENCOUNTER — Emergency Department (HOSPITAL_COMMUNITY)
Admission: EM | Admit: 2015-03-06 | Discharge: 2015-03-07 | Disposition: A | Discharge status: Home / Self Care | Payer: Medicare Other | Attending: Emergency Medicine | Admitting: Emergency Medicine

## 2015-03-06 DIAGNOSIS — Z9861 Coronary angioplasty status: Secondary | ICD-10-CM | POA: Diagnosis not present

## 2015-03-06 DIAGNOSIS — M1990 Unspecified osteoarthritis, unspecified site: Secondary | ICD-10-CM | POA: Insufficient documentation

## 2015-03-06 DIAGNOSIS — K5641 Fecal impaction: Secondary | ICD-10-CM | POA: Diagnosis not present

## 2015-03-06 DIAGNOSIS — M109 Gout, unspecified: Secondary | ICD-10-CM | POA: Diagnosis not present

## 2015-03-06 DIAGNOSIS — Z880 Allergy status to penicillin: Secondary | ICD-10-CM | POA: Diagnosis not present

## 2015-03-06 DIAGNOSIS — Z853 Personal history of malignant neoplasm of breast: Secondary | ICD-10-CM | POA: Diagnosis not present

## 2015-03-06 DIAGNOSIS — I4891 Unspecified atrial fibrillation: Secondary | ICD-10-CM | POA: Diagnosis not present

## 2015-03-06 DIAGNOSIS — Z79899 Other long term (current) drug therapy: Secondary | ICD-10-CM | POA: Diagnosis not present

## 2015-03-06 DIAGNOSIS — J449 Chronic obstructive pulmonary disease, unspecified: Secondary | ICD-10-CM | POA: Diagnosis not present

## 2015-03-06 DIAGNOSIS — Z7901 Long term (current) use of anticoagulants: Secondary | ICD-10-CM | POA: Insufficient documentation

## 2015-03-06 DIAGNOSIS — Z87891 Personal history of nicotine dependence: Secondary | ICD-10-CM | POA: Diagnosis not present

## 2015-03-06 DIAGNOSIS — F419 Anxiety disorder, unspecified: Secondary | ICD-10-CM | POA: Diagnosis not present

## 2015-03-06 DIAGNOSIS — K649 Unspecified hemorrhoids: Secondary | ICD-10-CM | POA: Diagnosis not present

## 2015-03-06 DIAGNOSIS — Z7951 Long term (current) use of inhaled steroids: Secondary | ICD-10-CM | POA: Insufficient documentation

## 2015-03-06 DIAGNOSIS — K5900 Constipation, unspecified: Secondary | ICD-10-CM | POA: Diagnosis present

## 2015-03-06 DIAGNOSIS — Z8669 Personal history of other diseases of the nervous system and sense organs: Secondary | ICD-10-CM | POA: Diagnosis not present

## 2015-03-06 HISTORY — DX: Chronic obstructive pulmonary disease, unspecified: J44.9

## 2015-03-06 HISTORY — DX: Sleep apnea, unspecified: G47.30

## 2015-03-06 NOTE — ED Notes (Signed)
Pt states she feels like she is impacted  Pt states she has been trying to have a bowel movement since about 1830 tonight  Pt states she is unable to pass the stool but feels full and has a lot of pressure in her rectal area  Pt states she is passing some bright red blood from the straining  Pt is very anxious in triage

## 2015-03-07 ENCOUNTER — Ambulatory Visit (INDEPENDENT_AMBULATORY_CARE_PROVIDER_SITE_OTHER): Payer: Medicare Other | Admitting: Ophthalmology

## 2015-03-07 ENCOUNTER — Other Ambulatory Visit (INDEPENDENT_AMBULATORY_CARE_PROVIDER_SITE_OTHER): Payer: Medicare Other | Admitting: *Deleted

## 2015-03-07 DIAGNOSIS — I482 Chronic atrial fibrillation, unspecified: Secondary | ICD-10-CM

## 2015-03-07 DIAGNOSIS — E785 Hyperlipidemia, unspecified: Secondary | ICD-10-CM

## 2015-03-07 DIAGNOSIS — I2510 Atherosclerotic heart disease of native coronary artery without angina pectoris: Secondary | ICD-10-CM

## 2015-03-07 LAB — HEPATIC FUNCTION PANEL
ALBUMIN: 3.9 g/dL (ref 3.6–5.1)
ALT: 18 U/L (ref 6–29)
AST: 22 U/L (ref 10–35)
Alkaline Phosphatase: 70 U/L (ref 33–130)
Bilirubin, Direct: 0.3 mg/dL — ABNORMAL HIGH (ref <=0.2)
Indirect Bilirubin: 1.3 mg/dL — ABNORMAL HIGH (ref 0.2–1.2)
TOTAL PROTEIN: 6.7 g/dL (ref 6.1–8.1)
Total Bilirubin: 1.6 mg/dL — ABNORMAL HIGH (ref 0.2–1.2)

## 2015-03-07 LAB — LIPID PANEL
CHOL/HDL RATIO: 3.5 ratio (ref <=5.0)
CHOLESTEROL: 190 mg/dL (ref 125–200)
HDL: 54 mg/dL (ref >=46)
LDL CALC: 113 mg/dL (ref <130)
TRIGLYCERIDES: 117 mg/dL (ref <150)
VLDL: 23 mg/dL (ref <30)

## 2015-03-07 MED ORDER — FLEET PEDIATRIC 3.5-9.5 GM/59ML RE ENEM: 1.0000 | ENEMA | Freq: Once | RECTAL | Status: DC

## 2015-03-07 MED ORDER — POLYETHYLENE GLYCOL 3350 17 GM/SCOOP PO POWD: ORAL | Status: DC

## 2015-03-07 NOTE — Addendum Note (Signed)
Addended by: Eulis Foster on: 03/07/2015 10:27 AM   Modules accepted: Orders

## 2015-03-07 NOTE — ED Provider Notes (Signed)
CSN: [id number]     Arrival date & time 03/06/15  2315 History   By signing my name below, I, [name], attest that this documentation has been prepared under the direction and in the presence of Mercedes Grosser, MD.  Electronically Signed: Forrestine [name], ED Scribe. 03/07/2015. 1:06 AM.   Chief Complaint  Patient presents with  . Fecal Impaction   The history is provided by the patient. No language interpreter was used.    HPI Comments: [name] is a 79 y.o. female who presents to the Emergency Department here for fecal impaction this evening. Pt states she has been attempting to have a bowel movement since 6:00 PM this evening. She reports ongoing straining and some rectal bleeding when attempting to pass stools. [name] typically takes Tramadol with Acetaminophen but states her last dose was 5 days ago. No OTC medications or home remedies attempted at home. She denies any previous history of constipation or issues moving her bowels. No recent fever, chills, nausea, vomiting, chest pain, or shortness of breath.   PCP: Shamleffer, Herschell Dimes, MD    Past Medical History  Diagnosis Date  . Anxiety   . Allergy   . Asthma   . 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 Incontinence of urine   . Plantar fasciitis   . 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 Gout     post operative  . Atrial fibrillation ([place]) 01/21/11  . 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 Hx of radiation therapy 04/02/11 -05/20/11    left breast  . COPD (chronic obstructive pulmonary disease) ([place])   . Sleep apnea    Past Surgical History  Procedure Laterality Date  . Tonsillectomy  1955  . Cholecystectomy  1955  . Hand surgery  1992    tendons between thumb and forefinger  . Coronary angioplasty with stent placement  2000  . Skin biopsy      1994, 2006, 2008, 2010, 2011, 2011, 2012-  pre-cancerous  . Dilation and curettage of uterus  1976     and 1996  . Breast lumpectomy w/ needle localization  01/29/2011    Left with [facility] Dr [name]   Family History  Problem Relation Age of Onset  . Heart disease Father   . Cancer Father     intestinal polyps  . Asthma Sister   . Heart disease Brother    Social History  Substance Use Topics  . Smoking status: Former Smoker -- 1.00 packs/day    Quit date: 12/05/1962  . Smokeless tobacco: Never Used  . Alcohol Use: No   OB History    No data available     Review of Systems  Constitutional: Negative for fever and chills.  Respiratory: Negative for cough and shortness of breath.   Cardiovascular: Negative for chest pain.  Gastrointestinal: Positive for constipation and blood in stool. Negative for nausea, vomiting and abdominal pain.  Musculoskeletal: Negative for back pain.  Skin: Negative for rash.  Neurological: Negative for headaches.  Psychiatric/Behavioral: Negative for confusion.  All other systems reviewed and are negative.     Allergies  Contrast media; Crestor; Lipitor; Vicodin; Codeine; Quinine derivatives; Sulfa antibiotics; Epinephrine; Penicillins; and Theophyllines  Home Medications   Prior to Admission medications   Medication Sig Start Date End Date Taking? Authorizing Provider  albuterol (PROVENTIL HFA;VENTOLIN HFA) 108 (90 BASE) MCG/ACT inhaler Inhale 2 puffs  into the lungs as needed for wheezing or shortness of breath.    Yes Historical Provider, MD  alendronate (FOSAMAX) 70 MG tablet Take 70 mg by mouth once a week. Patient takes on Saturday 11/10/11  Yes Historical Provider, MD  allopurinol (ZYLOPRIM) 100 MG tablet Take 300 mg by mouth daily.    Yes Historical Provider, MD  ALPRAZolam Duanne Moron) 0.5 MG tablet Take 0.5-1 mg by mouth 2 (two) times daily as needed for anxiety or sleep.    Yes Historical Provider, MD  atenolol (TENORMIN) 25 MG tablet Take 1 tablet (25 mg total) by mouth daily. 09/22/14  Yes Jettie Booze, MD  Biotin 5000 MCG CAPS Take 5,000  mcg by mouth daily.    Yes Historical Provider, MD  cholecalciferol (VITAMIN D) 1000 UNITS tablet Take 1,000 Units by mouth daily.   Yes Historical Provider, MD  colchicine (COLCRYS) 0.6 MG tablet Take 0.6 mg by mouth daily.   Yes Historical Provider, MD  ezetimibe (ZETIA) 10 MG tablet Take 0.5-1 tablets (5-10 mg total) by mouth daily. Patient taking differently: Take 5 mg by mouth daily.  10/09/14  Yes Jettie Booze, MD  fexofenadine (ALLEGRA) 180 MG tablet Take 180 mg by mouth daily.     Yes Historical Provider, MD  fluticasone Asencion Islam) 50 MCG/ACT nasal spray  03/12/11  Yes Historical Provider, MD  Fluticasone-Salmeterol (ADVAIR) 500-50 MCG/DOSE AEPB Inhale 1 puff into the lungs every 12 (twelve) hours.     Yes Historical Provider, MD  hydrochlorothiazide 25 MG tablet Take 50 mg by mouth once.     Yes Historical Provider, MD  KLOR-CON M20 20 MEQ tablet Take 20 mEq by mouth daily.  11/12/11  Yes Historical Provider, MD  letrozole (FEMARA) 2.5 MG tablet Take 1 tablet (2.5 mg total) by mouth daily. 10/11/14  Yes Laurie Panda, NP  levothyroxine (SYNTHROID, LEVOTHROID) 50 MCG tablet Take 50 mcg by mouth daily.     Yes Historical Provider, MD  montelukast (SINGULAIR) 10 MG tablet Take 10 mg by mouth at bedtime.     Yes Historical Provider, MD  nystatin cream (MYCOSTATIN) Apply 1 application topically daily as needed for dry skin.  03/09/14  Yes Historical Provider, MD  rivaroxaban (XARELTO) 20 MG TABS tablet Take 1 tablet (20 mg total) by mouth daily. 10/04/14  Yes Jettie Booze, MD  rosuvastatin (CRESTOR) 10 MG tablet Take 1 tablet (10 mg total) by mouth 2 (two) times a week. As of 02/18/2013, taking 2 x weekly 10/13/14  Yes Jettie Booze, MD  traMADol (ULTRAM) 50 MG tablet Take 50 mg by mouth every 12 (twelve) hours as needed for moderate pain.  04/23/12  Yes Historical Provider, MD  vitamin C (ASCORBIC ACID) 500 MG tablet Take 500 mg by mouth daily.   Yes Historical Provider, MD   co-enzyme Q-10 50 MG capsule Take 400 mg by mouth daily.    Historical Provider, MD  fluconazole (DIFLUCAN) 100 MG tablet Take as directed Patient not taking: Reported on 03/07/2015 11/03/14   Chauncey Cruel, MD   Triage Vitals: BP 138/70 mmHg  Pulse 84  Temp(Src) 98.2 F (36.8 C) (Oral)  Resp 16  SpO2 99%    Physical Exam  Constitutional: She is oriented to person, place, and time. She appears well-developed and well-nourished. No distress.  HENT:  Head: Normocephalic and atraumatic.  Eyes: EOM are normal.  Neck: Normal range of motion.  Cardiovascular: Normal rate, regular rhythm and normal heart sounds.   Pulmonary/Chest:  Effort normal and breath sounds normal.  Abdominal: Soft. She exhibits no distension. There is no tenderness.  Genitourinary:  Firm stool ball in rectum that was easily septated   Musculoskeletal: Normal range of motion.  Neurological: She is alert and oriented to person, place, and time.  Skin: Skin is warm and dry.  Psychiatric: She has a normal mood and affect. Judgment normal.  Nursing note and vitals reviewed.   ED Course  Procedures (including critical care time)  DIAGNOSTIC STUDIES: Oxygen Saturation is 99% on RA, Normal by my interpretation.    COORDINATION OF CARE: 12:37 AM- Will discharge home with fleet and miralax. Discussed treatment plan with pt at bedside and pt agreed to plan.     Labs Review Labs Reviewed - No data to display  Imaging Review No results found. I have personally reviewed and evaluated these images and lab results as part of my medical decision-making.   EKG Interpretation None      MDM   Final diagnoses:  Fecal impaction [facility])    79 year old female presents with fecal impaction and difficulty with stooling. Small amount of bright red blood seen. Has evidence of chronic hemorrhoid that is stable in nature. Disimpacted manually at bedside. Able to stool in the emergency department. Provided laxatives and  enemas for home care.  I personally performed the services described in this documentation, which was scribed in my presence. The recorded information has been reviewed and is accurate.     Mercedes Grosser, MD 03/07/15 [phone]

## 2015-03-07 NOTE — Discharge Instructions (Signed)
Fecal Impaction °A fecal impaction happens when there is a large, firm amount of stool (or feces) that cannot be passed. The impacted stool is usually in the rectum, which is the lowest part of the large bowel. The impacted stool can block the colon and cause significant problems. °CAUSES  °The longer stool stays in the rectum, the harder it gets. Anything that slows down your bowel movements can lead to fecal impaction, such as: °· Constipation. This can be a long-standing (chronic) problem or can happen suddenly (acute). °· Painful conditions of the rectum, such as hemorrhoids or anal fissures. The pain of these conditions can make you try to avoid having bowel movements. °· Narcotic pain-relieving medicines, such as methadone, morphine, or codeine. °· Not drinking enough fluids. °· Inactivity and bed rest over long periods of time. °· Diseases of the brain or nervous system that damage the nerves controlling the muscles of the intestines. °SIGNS AND SYMPTOMS  °· Lack of normal bowel movements or changes in bowel patterns. °· Sense of fullness in the rectum but unable to pass stool. °· Pain or cramps in the abdominal area (often after meals). °· Thin, watery discharge from the rectum. °DIAGNOSIS  °Your health care provider may suspect that you have a fecal impaction based on your symptoms and a physical exam. This will include an exam of your rectum. Sometimes X-rays or lab testing may be needed to confirm the diagnosis and to be sure there are no other problems.  °TREATMENT  °· Initially an impaction can be removed manually. Using a gloved finger, your health care provider can remove hard stool from your rectum. °· Medicine is sometimes needed. A suppository or enema can be given in the rectum to soften the stool, which can stimulate a bowel movement. Medicines can also be given by mouth (orally). °· Though rare, surgery may be needed if the colon has torn (perforated) due to blockage. °HOME CARE INSTRUCTIONS   °· Develop regular bowel habits. This could include getting in the habit of having a bowel movement after your morning cup of coffee or after eating. Be sure to allow yourself enough time on the toilet. °· Maintain a high-fiber diet. °· Drink enough fluids to keep your urine clear or pale yellow as directed by your health care provider. °· Exercise regularly. °· If you begin to get constipated, increase the amount of fiber in your diet. Eat plenty of fruits, vegetables, whole wheat breads, bran, oatmeal, and similar products. °· Take natural fiber laxatives or other laxatives only as directed by your health care provider. °SEEK MEDICAL CARE IF:  °· You have ongoing rectal pain. °· You require enemas or suppositories more than twice a week. °· You have rectal bleeding. °· You have continued problems, or you develop abdominal pain. °· You have thin, pencil-like stools. °SEEK IMMEDIATE MEDICAL CARE IF:  °You have black or tarry stools. °MAKE SURE YOU:  °· Understand these instructions. °· Will watch your condition. °· Will get help right away if you are not doing well or get worse. °  °This information is not intended to replace advice given to you by your health care provider. Make sure you discuss any questions you have with your health care provider. °  °Document Released: 01/13/2004 Document Revised: 02/10/2013 Document Reviewed: 10/27/2012 °Elsevier Interactive Patient Education ©2016 [employer] ° °

## 2015-03-07 NOTE — ED Notes (Signed)
Patient had large bowel movement and now states she feels better and no longer feels impacted.

## 2015-03-09 ENCOUNTER — Ambulatory Visit (INDEPENDENT_AMBULATORY_CARE_PROVIDER_SITE_OTHER): Payer: Medicare Other | Admitting: Ophthalmology

## 2015-03-13 ENCOUNTER — Telehealth: Payer: Self-pay | Admitting: Interventional Cardiology

## 2015-03-13 NOTE — Telephone Encounter (Signed)
F/u    Pt calling back stating she just spoke to [place] about her results but has more questions. Please call back.

## 2015-03-13 NOTE — Telephone Encounter (Signed)
F/u ° ° ° ° ° ° ° °Pt returning Lynn's phone call. °

## 2015-03-14 ENCOUNTER — Other Ambulatory Visit: Payer: Self-pay

## 2015-03-14 ENCOUNTER — Ambulatory Visit (HOSPITAL_BASED_OUTPATIENT_CLINIC_OR_DEPARTMENT_OTHER): Payer: Medicare Other

## 2015-03-14 ENCOUNTER — Telehealth: Payer: Self-pay

## 2015-03-14 ENCOUNTER — Ambulatory Visit (HOSPITAL_BASED_OUTPATIENT_CLINIC_OR_DEPARTMENT_OTHER): Payer: Medicare Other | Admitting: Nurse Practitioner

## 2015-03-14 VITALS — BP 128/64 | HR 57 | Temp 97.7°F | Resp 18 | Ht 65.0 in | Wt 286.7 lb

## 2015-03-14 DIAGNOSIS — R197 Diarrhea, unspecified: Secondary | ICD-10-CM

## 2015-03-14 DIAGNOSIS — R590 Localized enlarged lymph nodes: Secondary | ICD-10-CM

## 2015-03-14 DIAGNOSIS — R599 Enlarged lymph nodes, unspecified: Secondary | ICD-10-CM

## 2015-03-14 DIAGNOSIS — Z7901 Long term (current) use of anticoagulants: Secondary | ICD-10-CM

## 2015-03-14 DIAGNOSIS — I4891 Unspecified atrial fibrillation: Secondary | ICD-10-CM | POA: Diagnosis not present

## 2015-03-14 DIAGNOSIS — C50412 Malignant neoplasm of upper-outer quadrant of left female breast: Secondary | ICD-10-CM

## 2015-03-14 LAB — CBC WITH DIFFERENTIAL/PLATELET
BASO%: 0.1 % (ref 0.0–2.0)
Basophils Absolute: 0 10*3/uL (ref 0.0–0.1)
EOS%: 3.9 % (ref 0.0–7.0)
Eosinophils Absolute: 0.3 10*3/uL (ref 0.0–0.5)
HCT: 42 % (ref 34.8–46.6)
HGB: 13.7 g/dL (ref 11.6–15.9)
LYMPH%: 27.8 % (ref 14.0–49.7)
MCH: 31.7 pg (ref 25.1–34.0)
MCHC: 32.5 g/dL (ref 31.5–36.0)
MCV: 97.4 fL (ref 79.5–101.0)
MONO#: 0.7 10*3/uL (ref 0.1–0.9)
MONO%: 10.3 % (ref 0.0–14.0)
NEUT%: 57.9 % (ref 38.4–76.8)
NEUTROS ABS: 4.2 10*3/uL (ref 1.5–6.5)
Platelets: 157 10*3/uL (ref 145–400)
RBC: 4.31 10*6/uL (ref 3.70–5.45)
RDW: 14.8 % — ABNORMAL HIGH (ref 11.2–14.5)
WBC: 7.2 10*3/uL (ref 3.9–10.3)
lymph#: 2 10*3/uL (ref 0.9–3.3)

## 2015-03-14 LAB — COMPREHENSIVE METABOLIC PANEL (CC13)
ALT: 26 U/L (ref 0–55)
ANION GAP: 7 meq/L (ref 3–11)
AST: 24 U/L (ref 5–34)
Albumin: 3.5 g/dL (ref 3.5–5.0)
Alkaline Phosphatase: 78 U/L (ref 40–150)
BUN: 14.4 mg/dL (ref 7.0–26.0)
CHLORIDE: 110 meq/L — AB (ref 98–109)
CO2: 28 meq/L (ref 22–29)
CREATININE: 0.8 mg/dL (ref 0.6–1.1)
Calcium: 9.8 mg/dL (ref 8.4–10.4)
EGFR: 75 mL/min/{1.73_m2} — ABNORMAL LOW (ref >90)
GLUCOSE: 82 mg/dL (ref 70–140)
Potassium: 3.9 mEq/L (ref 3.5–5.1)
SODIUM: 145 meq/L (ref 136–145)
TOTAL PROTEIN: 6.4 g/dL (ref 6.4–8.3)
Total Bilirubin: 1.74 mg/dL — ABNORMAL HIGH (ref 0.20–1.20)

## 2015-03-14 NOTE — Telephone Encounter (Signed)
Patient called worried about several glands that have appeared in her neck over the past month.  Patient has had two different antibiotics as well as steroids.  She is very concerned that this might be cancer and is requesting an appt.  Patient to see Selena Lesser, NP today with labs.

## 2015-03-16 ENCOUNTER — Telehealth: Payer: Self-pay | Admitting: Internal Medicine

## 2015-03-16 ENCOUNTER — Telehealth: Payer: Self-pay

## 2015-03-16 ENCOUNTER — Telehealth: Payer: Self-pay | Admitting: Oncology

## 2015-03-16 ENCOUNTER — Other Ambulatory Visit: Payer: Self-pay | Admitting: Oncology

## 2015-03-16 ENCOUNTER — Telehealth: Payer: Self-pay | Admitting: *Deleted

## 2015-03-16 ENCOUNTER — Encounter: Payer: Self-pay | Admitting: Nurse Practitioner

## 2015-03-16 DIAGNOSIS — R590 Localized enlarged lymph nodes: Secondary | ICD-10-CM | POA: Insufficient documentation

## 2015-03-16 DIAGNOSIS — R197 Diarrhea, unspecified: Secondary | ICD-10-CM | POA: Insufficient documentation

## 2015-03-16 DIAGNOSIS — Z7901 Long term (current) use of anticoagulants: Secondary | ICD-10-CM | POA: Insufficient documentation

## 2015-03-16 NOTE — Telephone Encounter (Signed)
Spoke to pt, she is working on obtaining a new PCP. Pt states she will be contacting them next week. She will request the colonoscopy at that time. Pt understands that this office can not manage primary care. She welcomed the referrals for primary care physicians. Advised pt this office would be calling her for the CT scan appt. Pt denies any further questions at this time and knows to call with any questions.

## 2015-03-16 NOTE — Telephone Encounter (Signed)
Pt reports she saw Lynnda Shields for swollen glands in her throat and that Cyndy ordered an xray of her head and chest.  Chart review - CT scan ordered not yet scheduled.  Pt reports she was in ED for fecal impaction and was recommend by ED to have a colonoscopy.  Pt is requesting that her abdomen also be scanned.  Let pt know I would pass message on to Symptom Management team.    Routed to [facility].

## 2015-03-16 NOTE — Progress Notes (Signed)
SYMPTOM MANAGEMENT CLINIC   HPI: [name] 79 y.o. female diagnosed with breast cancer.  Currently undergoing Mitrazol therapy.  Patient presented to the [facility] today with multiple complaints.  Patient states that she has noticed increased/enlarged lymph nodes to her bilateral cervical region of her neck for the last few weeks.  She denies any recent URI symptoms.  She also denies any sore throat or cough.  She denies any chest pain, chest pressure, or pain with inspiration.  She denies any recent fevers or chills.  She does report some chronic diarrhea within the last few weeks; but states she's had no diarrhea for the last 36 hours.  She is requesting a stool for C. difficile today.  Patient also states that she has been trying to diet; stating that she has 30 more pounds to lose until she can have surgery to her bilateral knees.   HPI  ROS  Past Medical History  Diagnosis Date  . Anxiety   . Allergy   . Asthma   . 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 Incontinence of urine   . Plantar fasciitis   . Breast cancer ([place]) Receptor + her2 _ 12/26/2010    left  . Gout     post operative  . Atrial fibrillation ([place]) 01/21/11  . Malignant neoplasm of upper-outer quadrant of female breast ([place]) 12/26/2010  . Hx of radiation therapy 04/02/11 -05/20/11    left breast  . COPD (chronic obstructive pulmonary disease) ([place])   . Sleep apnea     Past Surgical History  Procedure Laterality Date  . Tonsillectomy  1955  . Cholecystectomy  1955  . Hand surgery  1992    tendons between thumb and forefinger  . Coronary angioplasty with stent placement  2000  . Skin biopsy      1994, 2006, 2008, 2010, 2011, 2011, 2012-  pre-cancerous  . Dilation and curettage of uterus  1976    and 1996  . Breast lumpectomy w/ needle localization  01/29/2011    Left with [facility] Dr Margot Chimes    has Breast cancer of upper-outer quadrant of left female breast ([place]); Breast cancer ([facility]) Receptor + her2  _; Atrial fibrillation ([place]); Plantar fasciitis; Arthritis; Allergy; Hx of radiation therapy; Hypokalemia; Elevated bilirubin; Hyperlipidemia; Coronary arteriosclerosis; Long term current use of anticoagulant therapy; Lymphadenopathy, cervical; Diarrhea; and Hyperbilirubinemia on her problem list.    is allergic to contrast media; crestor; lipitor; vicodin; codeine; quinine derivatives; sulfa antibiotics; epinephrine; penicillins; and theophyllines.    Medication List       This list is accurate as of: 03/14/15 11:59 PM.  Always use your most recent med list.               albuterol 108 (90 BASE) MCG/ACT inhaler  Commonly known as:  PROVENTIL HFA;VENTOLIN HFA  Inhale 2 puffs into the lungs as needed for wheezing or shortness of breath.     alendronate 70 MG tablet  Commonly known as:  FOSAMAX  Take 70 mg by mouth once a week. Patient takes on Saturday     allopurinol 100 MG tablet  Commonly known as:  ZYLOPRIM  Take 300 mg by mouth daily.     ALPRAZolam 0.5 MG tablet  Commonly known as:  XANAX  Take 0.5-1 mg by mouth 2 (two) times daily as needed for anxiety or sleep.     atenolol 25 MG tablet  Commonly known as:  TENORMIN  Take  1 tablet (25 mg total) by mouth daily.     Biotin 5000 MCG Caps  Take 5,000 mcg by mouth daily.     cholecalciferol 1000 UNITS tablet  Commonly known as:  VITAMIN D  Take 1,000 Units by mouth daily.     co-enzyme Q-10 50 MG capsule  Take 400 mg by mouth daily.     COLCRYS 0.6 MG tablet  Generic drug:  colchicine  Take 0.6 mg by mouth daily as needed (flare  up).     ezetimibe 10 MG tablet  Commonly known as:  ZETIA  Take 0.5-1 tablets (5-10 mg total) by mouth daily.     fexofenadine 180 MG tablet  Commonly known as:  ALLEGRA  Take 180 mg by mouth daily.     fluticasone 50 MCG/ACT nasal spray  Commonly known as:  FLONASE  Place 1 spray into both nostrils daily.     Fluticasone-Salmeterol 500-50 MCG/DOSE Aepb  Commonly known as:   ADVAIR  Inhale 1 puff into the lungs every 12 (twelve) hours.     gabapentin 100 MG capsule  Commonly known as:  NEURONTIN  Take 100 mg by mouth daily.     hydrochlorothiazide 25 MG tablet  Commonly known as:  HYDRODIURIL  Take 50 mg by mouth daily.     KLOR-CON M20 20 MEQ tablet  Generic drug:  potassium chloride SA  Take 20 mEq by mouth daily.     letrozole 2.5 MG tablet  Commonly known as:  FEMARA  Take 1 tablet (2.5 mg total) by mouth daily.     levocetirizine 5 MG tablet  Commonly known as:  XYZAL  Take 5 mg by mouth at bedtime.     levothyroxine 50 MCG tablet  Commonly known as:  SYNTHROID, LEVOTHROID  Take 50 mcg by mouth daily.     magic mouthwash w/lidocaine Soln  Take 5 mLs by mouth 3 (three) times daily as needed for mouth pain.     montelukast 10 MG tablet  Commonly known as:  SINGULAIR  Take 10 mg by mouth at bedtime.     nystatin cream  Commonly known as:  MYCOSTATIN  Apply 1 application topically daily as needed for dry skin.     rivaroxaban 20 MG Tabs tablet  Commonly known as:  XARELTO  Take 1 tablet (20 mg total) by mouth daily.     rosuvastatin 10 MG tablet  Commonly known as:  CRESTOR  Take 1 tablet (10 mg total) by mouth 2 (two) times a week. As of 02/18/2013, taking 2 x weekly     sodium phosphate Pediatric 3.5-9.5 GM/59ML enema  Place 66 mLs (1 enema total) rectally once.     traMADol 50 MG tablet  Commonly known as:  ULTRAM  Take 50 mg by mouth every 12 (twelve) hours as needed for moderate pain.     traMADol-acetaminophen 37.5-325 MG tablet  Commonly known as:  ULTRACET  Take 1-2 tablets by mouth every 6 (six) hours as needed for moderate pain or severe pain.     vitamin C 500 MG tablet  Commonly known as:  ASCORBIC ACID  Take 500 mg by mouth daily.         PHYSICAL EXAMINATION  Oncology Vitals 03/14/2015 03/07/2015  Height 165 cm -  Weight 130.046 kg -  Weight (lbs) 286 lbs 11 oz -  BMI (kg/m2) 47.71 kg/m2 -  Temp 97.7 -    Pulse 57 64  Resp 18 16  Resp (Historical  as of 12/05/11) - -  SpO2 99 97  BSA (m2) 2.44 m2 -   BP Readings from Last 2 Encounters:  03/14/15 128/64  03/07/15 144/70    Physical Exam  Constitutional: She is oriented to person, place, and time and well-developed, well-nourished, and in no distress.  HENT:  Head: Normocephalic and atraumatic.  Right Ear: External ear normal.  Left Ear: External ear normal.  Nose: Nose normal.  Mouth/Throat: Oropharynx is clear and moist.  Patient has some palpable enlarged nodes to her bilateral cervical region of her neck.  There is no tenderness to the neck with palpation.  Eyes: Conjunctivae and EOM are normal. Pupils are equal, round, and reactive to light. Right eye exhibits no discharge. Left eye exhibits no discharge. No scleral icterus.  Neck: Normal range of motion. Neck supple. No JVD present. No tracheal deviation present. No thyromegaly present.  Cardiovascular: Normal rate, regular rhythm, normal heart sounds and intact distal pulses.   Pulmonary/Chest: Breath sounds normal. No respiratory distress. She has no wheezes. She has no rales. She exhibits no tenderness.  Abdominal: Soft. Bowel sounds are normal. She exhibits no distension and no mass. There is no tenderness. There is no rebound and no guarding.  Musculoskeletal: Normal range of motion. She exhibits no edema or tenderness.  Lymphadenopathy:    She has no cervical adenopathy.  Neurological: She is alert and oriented to person, place, and time.  Skin: Skin is warm and dry. No rash noted. No erythema. No pallor.  Psychiatric: Affect normal.  Nursing note and vitals reviewed.   LABORATORY DATA:. Appointment on 03/14/2015  Component Date Value Ref Range Status  . WBC 03/14/2015 7.2  3.9 - 10.3 10e3/uL Final  . NEUT# 03/14/2015 4.2  1.5 - 6.5 10e3/uL Final  . HGB 03/14/2015 13.7  11.6 - 15.9 g/dL Final  . HCT 03/14/2015 42.0  34.8 - 46.6 % Final  . Platelets 03/14/2015 157  145  - 400 10e3/uL Final  . MCV 03/14/2015 97.4  79.5 - 101.0 fL Final  . MCH 03/14/2015 31.7  25.1 - 34.0 pg Final  . MCHC 03/14/2015 32.5  31.5 - 36.0 g/dL Final  . RBC 03/14/2015 4.31  3.70 - 5.45 10e6/uL Final  . RDW 03/14/2015 14.8* 11.2 - 14.5 % Final  . lymph# 03/14/2015 2.0  0.9 - 3.3 10e3/uL Final  . MONO# 03/14/2015 0.7  0.1 - 0.9 10e3/uL Final  . Eosinophils Absolute 03/14/2015 0.3  0.0 - 0.5 10e3/uL Final  . Basophils Absolute 03/14/2015 0.0  0.0 - 0.1 10e3/uL Final  . NEUT% 03/14/2015 57.9  38.4 - 76.8 % Final  . LYMPH% 03/14/2015 27.8  14.0 - 49.7 % Final  . MONO% 03/14/2015 10.3  0.0 - 14.0 % Final  . EOS% 03/14/2015 3.9  0.0 - 7.0 % Final  . BASO% 03/14/2015 0.1  0.0 - 2.0 % Final  . Sodium 03/14/2015 145  136 - 145 mEq/L Final  . Potassium 03/14/2015 3.9  3.5 - 5.1 mEq/L Final  . Chloride 03/14/2015 110* 98 - 109 mEq/L Final  . CO2 03/14/2015 28  22 - 29 mEq/L Final  . Glucose 03/14/2015 82  70 - 140 mg/dl Final   Glucose reference range is for nonfasting patients. Fasting glucose reference range is 70- 100.  Marland Kitchen BUN 03/14/2015 14.4  7.0 - 26.0 mg/dL Final  . Creatinine 03/14/2015 0.8  0.6 - 1.1 mg/dL Final  . Total Bilirubin 03/14/2015 1.74* 0.20 - 1.20 mg/dL Final  . Alkaline  Phosphatase 03/14/2015 78  40 - 150 U/L Final  . AST 03/14/2015 24  5 - 34 U/L Final  . ALT 03/14/2015 26  0 - 55 U/L Final  . Total Protein 03/14/2015 6.4  6.4 - 8.3 g/dL Final  . Albumin 03/14/2015 3.5  3.5 - 5.0 g/dL Final  . Calcium 03/14/2015 9.8  8.4 - 10.4 mg/dL Final  . Anion Gap 03/14/2015 7  3 - 11 mEq/L Final  . EGFR 03/14/2015 75* >90 ml/min/1.73 m2 Final   eGFR is calculated using the CKD-EPI Creatinine Equation (2009)     RADIOGRAPHIC STUDIES: No results found.  ASSESSMENT/PLAN:    Breast cancer of upper-outer quadrant of left female breast [facility]) Patient continues to take her left resolve as directed on a daily basis.  She has recently noticed increased cervical region  lymphadenopathy.  She denies any URI, sore throat, or other symptoms of infection recently.  She denies any recent fevers or chills.  Blood counts obtained today reveal a WBC of 7.2, ANC 4.2, hemoglobin 13.7, platelet count 157.  Vital signs were stable today; and patient was afebrile with a temperature of 97.7.  After reviewing all findings with Dr. Rolm Bookbinder was made to obtain a CT with contrast of the chest and a CT with contrast of the soft tissues of the neck for further evaluation.  Of note-patient does have a dye allergy to MRI dye specifically.  Patient will be scheduled for a follow-up visit on 04/03/2015.  Long term current use of anticoagulant therapy Patient has history of atrial fib; and continues to take Xarelto as directed.  Lymphadenopathy, cervical Patient states that she has noticed some enlargement of the lymph nodes to both sides of the neck with the last several weeks.  She states that sometimes these lymph nodes are also tender; but does not complain of tenderness at this time.  She denies any URI or other head/neck infection type symptoms whatsoever.  She also denies any recent fevers or chills.  Exam reveals some palpable enlarged lymph nodes to the bilateral cervical regions of the neck.  There are no URI symptoms.  On exam.  Posterior oropharynx is clear with no exudate.  Will obtain a CT with contrast of the soft tissues of the neck; as well as the chest for further evaluation.  In the meantime-patient was advised to call/return or go directly to the emergency department for any worsening symptoms whatsoever.  Diarrhea Patient's has had some chronic diarrhea episodes with the last few weeks; but states that she's had no diarrhea for the last 36 hours.  Patient denies any recent fevers or chills.  Patient requested and was given a stool for C. difficile kit to take home.  Advised both patient and her husband that they would not be able to run a C. difficile  test unless the stool is actually liquid.  Both patient and her husband stated understanding of instructions.  Hyperbilirubinemia Patient has history of chronic elevated bilirubin.  Bilirubin has increased from 1.6 up to 1.74.  Will continue to monitor closely.  Patient stated understanding of all instructions; and was in agreement with this plan of care. The patient knows to call the clinic with any problems, questions or concerns.   Review/collaboration with Dr. Jana Hakim regarding all aspects of patient's visit today.   Total time spent with patient was greater than 70 minutes.  Extended service time;  with greater than 75 percent of that time spent in face to face counseling regarding  patient's symptoms,  and coordination of care and follow up.  Disclaimer:This dictation was prepared with Dragon/digital dictation along with [employer]. Any transcriptional errors that result from this process are unintentional.  Drue Second, NP 03/16/2015

## 2015-03-16 NOTE — Assessment & Plan Note (Signed)
Patient states that she has noticed some enlargement of the lymph nodes to both sides of the neck with the last several weeks.  She states that sometimes these lymph nodes are also tender; but does not complain of tenderness at this time.  She denies any URI or other head/neck infection type symptoms whatsoever.  She also denies any recent fevers or chills.  Exam reveals some palpable enlarged lymph nodes to the bilateral cervical regions of the neck.  There are no URI symptoms.  On exam.  Posterior oropharynx is clear with no exudate.  Will obtain a CT with contrast of the soft tissues of the neck; as well as the chest for further evaluation.  In the meantime-patient was advised to call/return or go directly to the emergency department for any worsening symptoms whatsoever.

## 2015-03-16 NOTE — Assessment & Plan Note (Signed)
Patient's has had some chronic diarrhea episodes with the last few weeks; but states that she's had no diarrhea for the last 36 hours.  Patient denies any recent fevers or chills.  Patient requested and was given a stool for C. difficile kit to take home.  Advised both patient and her husband that they would not be able to run a C. difficile test unless the stool is actually liquid.  Both patient and her husband stated understanding of instructions.

## 2015-03-16 NOTE — Telephone Encounter (Signed)
Pt called request to be new pt, she has problem with swollen lymp node and Dr. Nat Math recommend her to see new PCP and he recommend Dr. Jenny Reichmann. Please advise.   Phone # [phone]

## 2015-03-16 NOTE — Assessment & Plan Note (Signed)
Patient has history of atrial fib; and continues to take Xarelto as directed.

## 2015-03-16 NOTE — Telephone Encounter (Signed)
Called patient and she is aware of her appointments °

## 2015-03-16 NOTE — Telephone Encounter (Signed)
Ok with me 

## 2015-03-16 NOTE — Assessment & Plan Note (Signed)
Patient continues to take her left resolve as directed on a daily basis.  She has recently noticed increased cervical region lymphadenopathy.  She denies any URI, sore throat, or other symptoms of infection recently.  She denies any recent fevers or chills.  Blood counts obtained today reveal a WBC of 7.2, ANC 4.2, hemoglobin 13.7, platelet count 157.  Vital signs were stable today; and patient was afebrile with a temperature of 97.7.  After reviewing all findings with Dr. Rolm Bookbinder was made to obtain a CT with contrast of the chest and a CT with contrast of the soft tissues of the neck for further evaluation.  Of note-patient does have a dye allergy to MRI dye specifically.  Patient will be scheduled for a follow-up visit on 04/03/2015.

## 2015-03-16 NOTE — Telephone Encounter (Signed)
Cindy, did patient fail to show?

## 2015-03-16 NOTE — Assessment & Plan Note (Signed)
Patient has history of chronic elevated bilirubin.  Bilirubin has increased from 1.6 up to 1.74.  Will continue to monitor closely.

## 2015-03-17 NOTE — Telephone Encounter (Signed)
appt is set.  °

## 2015-03-24 ENCOUNTER — Ambulatory Visit (INDEPENDENT_AMBULATORY_CARE_PROVIDER_SITE_OTHER): Payer: Medicare Other | Admitting: Internal Medicine

## 2015-03-24 ENCOUNTER — Encounter: Payer: Self-pay | Admitting: Internal Medicine

## 2015-03-24 ENCOUNTER — Ambulatory Visit (INDEPENDENT_AMBULATORY_CARE_PROVIDER_SITE_OTHER): Payer: Medicare Other | Admitting: Ophthalmology

## 2015-03-24 VITALS — BP 114/72 | HR 95 | Temp 98.1°F

## 2015-03-24 DIAGNOSIS — E039 Hypothyroidism, unspecified: Secondary | ICD-10-CM | POA: Insufficient documentation

## 2015-03-24 DIAGNOSIS — J45909 Unspecified asthma, uncomplicated: Secondary | ICD-10-CM | POA: Insufficient documentation

## 2015-03-24 DIAGNOSIS — G629 Polyneuropathy, unspecified: Secondary | ICD-10-CM

## 2015-03-24 DIAGNOSIS — G4733 Obstructive sleep apnea (adult) (pediatric): Secondary | ICD-10-CM | POA: Insufficient documentation

## 2015-03-24 DIAGNOSIS — M109 Gout, unspecified: Secondary | ICD-10-CM | POA: Insufficient documentation

## 2015-03-24 DIAGNOSIS — M810 Age-related osteoporosis without current pathological fracture: Secondary | ICD-10-CM

## 2015-03-24 DIAGNOSIS — R221 Localized swelling, mass and lump, neck: Secondary | ICD-10-CM | POA: Insufficient documentation

## 2015-03-24 DIAGNOSIS — I10 Essential (primary) hypertension: Secondary | ICD-10-CM | POA: Diagnosis not present

## 2015-03-24 HISTORY — DX: Essential (primary) hypertension: I10

## 2015-03-24 HISTORY — DX: Age-related osteoporosis without current pathological fracture: M81.0

## 2015-03-24 HISTORY — DX: Polyneuropathy, unspecified: G62.9

## 2015-03-24 HISTORY — DX: Hypothyroidism, unspecified: E03.9

## 2015-03-24 MED ORDER — AMOXICILLIN-POT CLAVULANATE 875-125 MG PO TABS: 1.0000 | ORAL_TABLET | Freq: Two times a day (BID) | ORAL | Status: DC

## 2015-03-24 NOTE — Progress Notes (Signed)
Subjective:    Patient ID: [name], female    DOB: 08/13/1935, 79 y.o.   MRN: [id number]  HPI  Here to establish as new pt accompanied by husband of whom she greatly depends; pt with long hx of anxiety, breast ca, afib on xarelto, CAD, HLD who presents with 7 page single spaced document regarding her PMH, an entreaty that she just wants to feel good again, and a question page of over 10 questions to consider today, and admits she has not been satisfied with several recent providers, though she felt greatly helped by Dr Jana Hakim..  Her total active treatments listed including CPAP number > 40 including supplements. Recent treatments from various providers number 12 listed, some more than once , but now no longer taking. Numerous allergy/intolerances noted as well.   I asked that we focus on the immediate concern only today, and consider more holistic concerns to next visit.  Pt states her most pressing concern is ? gland issue with left face swelling tenderness on the left neck and face near the ear, c/o just feeling bad, exhaustion; chills and feverish at times, diffuse various myalgias, never felt this way before except one time with lipitor when "I felt the life was draining out of me."  Stopped her zetia and crestor last wk after an improved LDL, hoping to feel better but has not helped.Marland Kitchen  Has been seen by UC more than once but still with the swelling after zpack and steroid injection, then later doxycycline which cause GI upset but did finish the course. No specific dental, ear or sinus symptoms.  No other known malignancy such as pulmonary. Has CT of head, neck and chest ordered per oncology, scheduled for early next week.  Has seen dental yesterday without specific dental abnormal found    Also mentions recent fecal impaction, tx for it almost sent her back to the hosp per pt due to diarrhea, and nausea, but resolved with stopping miralax, but still weak feeling.  Has lost 80 lbs per pt, plans  to lose 30 more in order to be eligible for knee surgury. Wt Readings from Last 3 Encounters:  03/14/15 286 lb 11.2 oz (130.046 kg)  10/11/14 287 lb 9.6 oz (130.455 kg)  09/22/14 286 lb 12.8 oz (130.092 kg)   Past Medical History  Diagnosis Date  . Anxiety   . Allergy   . Asthma   . 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 Incontinence of urine   . Plantar fasciitis   . 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 Gout     post operative  . Atrial fibrillation ([place]) 01/21/11  . Malignant neoplasm of upper-outer quadrant of female breast ([place]) 12/26/2010  . Hx of radiation therapy 04/02/11 -05/20/11    left breast  . COPD (chronic obstructive pulmonary disease) ([place])   . Sleep apnea   . Hypothyroidism 03/24/2015  . Gout 03/24/2015  . Essential hypertension 03/24/2015  . Asthma 03/24/2015  . Peripheral neuropathy ([place]) 03/24/2015  . Osteoporosis 03/24/2015  . OSA (obstructive sleep apnea) 03/24/2015   Past Surgical History  Procedure Laterality Date  . Tonsillectomy  1955  . Cholecystectomy  1955  . Hand surgery  1992    tendons between thumb and forefinger  . Coronary angioplasty with stent placement  2000  . Skin biopsy      1994, 2006, 2008, 2010, 2011, 2011, 2012-  pre-cancerous  .  Dilation and curettage of uterus  1976    and 1996  . Breast lumpectomy w/ needle localization  01/29/2011    Left with [facility] Dr Margot Chimes    reports that she quit smoking about 52 years ago. She has never used smokeless tobacco. She reports that she does not drink alcohol or use illicit drugs. family history includes Asthma in her sister; Cancer in her father; Heart disease in her brother and father. Allergies  Allergen Reactions  . Contrast Media [Iodinated Diagnostic Agents] Nausea And Vomiting    IV dye for MRI  . Crestor [Rosuvastatin Calcium]     Severe liver function problems  . Lipitor [Atorvastatin Calcium] Other (See Comments)    Elevated liver function   . Vicodin  [Hydrocodone-Acetaminophen] Itching    Itching all over  . Codeine Nausea Only and Anxiety    Also complained of dizziness.  Has same problems with oxycodone and hydrocodone  . Quinine Derivatives Nausea Only    dizzy  . Sulfa Antibiotics Nausea Only    Also complained of dizziness  . Keflex [Cephalexin]   . Epinephrine Other (See Comments)    Patient becomes "shaky"  . Penicillins Rash    Pt reported all over body rash in 1958 Has patient had a PCN reaction causing immediate rash, facial/tongue/throat swelling, SOB or lightheadedness with hypotension:Yes Has patient had a PCN reaction causing severe rash involving mucus membranes or skin necrosis: No Has patient had a PCN reaction that required hospitalization: No Has patient had a PCN reaction occurring within the last 10 years:No    . Theophyllines Other (See Comments)    Patient becomes "shaky"   Current Outpatient Prescriptions on File Prior to Visit  Medication Sig Dispense Refill  . albuterol (PROVENTIL HFA;VENTOLIN HFA) 108 (90 BASE) MCG/ACT inhaler Inhale 2 puffs into the lungs as needed for wheezing or shortness of breath.     Marland Kitchen alendronate (FOSAMAX) 70 MG tablet Take 70 mg by mouth once a week. Patient takes on Saturday    . allopurinol (ZYLOPRIM) 100 MG tablet Take 300 mg by mouth daily.     Marland Kitchen ALPRAZolam (XANAX) 0.5 MG tablet Take 0.5-1 mg by mouth 2 (two) times daily as needed for anxiety or sleep.     Marland Kitchen atenolol (TENORMIN) 25 MG tablet Take 1 tablet (25 mg total) by mouth daily.    . Biotin 5000 MCG CAPS Take 5,000 mcg by mouth daily.     . cholecalciferol (VITAMIN D) 1000 UNITS tablet Take 1,000 Units by mouth daily.    Marland Kitchen co-enzyme Q-10 50 MG capsule Take 400 mg by mouth daily.    . colchicine (COLCRYS) 0.6 MG tablet Take 0.6 mg by mouth daily as needed (flare  up).     . ezetimibe (ZETIA) 10 MG tablet Take 0.5-1 tablets (5-10 mg total) by mouth daily. (Patient taking differently: Take 5 mg by mouth daily. ) 90 tablet  3  . fexofenadine (ALLEGRA) 180 MG tablet Take 180 mg by mouth daily.      . fluticasone (FLONASE) 50 MCG/ACT nasal spray Place 1 spray into both nostrils daily.     . Fluticasone-Salmeterol (ADVAIR) 500-50 MCG/DOSE AEPB Inhale 1 puff into the lungs every 12 (twelve) hours.      . gabapentin (NEURONTIN) 100 MG capsule Take 100 mg by mouth daily.  0  . hydrochlorothiazide 25 MG tablet Take 50 mg by mouth daily.     Marland Kitchen KLOR-CON M20 20 MEQ tablet Take 20 mEq by  mouth daily.     Marland Kitchen letrozole (FEMARA) 2.5 MG tablet Take 1 tablet (2.5 mg total) by mouth daily. 90 tablet 2  . levocetirizine (XYZAL) 5 MG tablet Take 5 mg by mouth at bedtime.  0  . levothyroxine (SYNTHROID, LEVOTHROID) 50 MCG tablet Take 50 mcg by mouth daily.      . magic mouthwash w/lidocaine SOLN Take 5 mLs by mouth 3 (three) times daily as needed for mouth pain.    . montelukast (SINGULAIR) 10 MG tablet Take 10 mg by mouth at bedtime.      Marland Kitchen nystatin cream (MYCOSTATIN) Apply 1 application topically daily as needed for dry skin.   0  . rivaroxaban (XARELTO) 20 MG TABS tablet Take 1 tablet (20 mg total) by mouth daily. 90 tablet 3  . rosuvastatin (CRESTOR) 10 MG tablet Take 1 tablet (10 mg total) by mouth 2 (two) times a week. As of 02/18/2013, taking 2 x weekly 10 tablet 11  . traMADol (ULTRAM) 50 MG tablet Take 50 mg by mouth every 12 (twelve) hours as needed for moderate pain.     . traMADol-acetaminophen (ULTRACET) 37.5-325 MG tablet Take 1-2 tablets by mouth every 6 (six) hours as needed for moderate pain or severe pain.   0  . vitamin C (ASCORBIC ACID) 500 MG tablet Take 500 mg by mouth daily.    . sodium phosphate Pediatric (FLEET) 3.5-9.5 GM/59ML enema Place 66 mLs (1 enema total) rectally once. (Patient not taking: Reported on 03/14/2015) 66.6 mL 0   No current facility-administered medications on file prior to visit.   Review of Systems  Constitutional: Negative for unusual diaphoresis or night sweats HENT: Negative for  ringing in ear or discharge Eyes: Negative for double vision or worsening visual disturbance.  Respiratory: Negative for choking and stridor.   Gastrointestinal: Negative for vomiting or other signifcant bowel change Genitourinary: Negative for hematuria or change in urine volume.  Musculoskeletal: Negative for other MSK pain or swelling Skin: Negative for color change and worsening wound.  Neurological: Negative for tremors and numbness other than noted  Psychiatric/Behavioral: Negative for decreased concentration or agitation other than above       Objective:   Physical Exam BP 114/72 mmHg  Pulse 95  Temp(Src) 98.1 F (36.7 C) (Oral)  SpO2 97% VS noted, nontoxic Constitutional: Pt appears in no significant distress HENT: Head: NCAT.  Right Ear: External ear normal.  Left Ear: External ear normal. Bilat tm's without erythema.  Max sinus areas non tender.  Pharynx with mild erythema, no exudate or swelling, no lesion, no gingival pain/swelling. Left ankle of jaw area nondiscrete mild tender swelling approx 3 -4 cm, vague firmness it seems but nonfluctuant  nondraining, no overlying skin change, overall area involves the left preauricular, post angle of jaw and somewhat immediate submandibular as well Eyes: . Pupils are equal, round, and reactive to light. Conjunctivae and EOM are normal Neck: Normal range of motion. Neck supple.  Cardiovascular: Normal rate and regular rhythm.   Pulmonary/Chest: Effort normal and breath sounds without rales or wheezing.  Neurological: Pt is alert. Not confused , motor grossly intact Skin: Skin is warm. No rash, no LE edema Psychiatric: Pt behavior is normal. No agitation. 1-2+ nervous    Assessment & Plan:

## 2015-03-24 NOTE — Assessment & Plan Note (Addendum)
Etiology unclear, abnormality seems inflammatory/infectious most likely given hx and exam, less likely malignant I suspect, diff includes LA, cystic mass, salivary gland abnormal, parotitis vs other malignancy such as squamous cell; agree with head/neck/chest CT, for now at risk of a 3rd antibiotic will try augmentin bid course, and will need ENT/possible biopsy if not improved and pending CT results   Note:  Total time for pt hx, exam, review of record with pt in the room, determination of diagnoses and plan for further eval and tx is > 40 min, with over 50% spent in coordination and counseling of patient

## 2015-03-24 NOTE — Patient Instructions (Addendum)
Please take all new medication as prescribed - the antibiotic  Please keep your appointments next week for the CT scans  If the CT is OK, and you feel better, we should hold off on further evaluation and treatment  If you are not feeling "better" with less pain/swelling in 1 wk, please call for ENT referral; this can usually be obtained with Dr Lucia Gaskins or Dr Ernesto Rutherford in 1-2 business days  OK to stop the antibiotic at 7 days if you have "too much" diarrhea starting  Please continue all other medications as before  Please have the pharmacy call with any other refills you may need.  Please keep your appointments with your specialists as you may have planned

## 2015-03-24 NOTE — Assessment & Plan Note (Signed)
To cont wt loss efforts

## 2015-03-24 NOTE — Assessment & Plan Note (Signed)
stable overall by history and exam, recent data reviewed with pt, and pt to continue medical treatment as before,  to f/u any worsening symptoms or concerns BP Readings from Last 3 Encounters:  03/24/15 114/72  03/14/15 128/64  03/07/15 144/70

## 2015-03-24 NOTE — Progress Notes (Signed)
Pre visit review using our clinic review tool, if applicable. No additional management support is needed unless otherwise documented below in the visit note. 

## 2015-03-25 ENCOUNTER — Other Ambulatory Visit: Payer: Self-pay | Admitting: Internal Medicine

## 2015-03-25 MED ORDER — CLINDAMYCIN HCL 300 MG PO CAPS: 300.0000 mg | ORAL_CAPSULE | Freq: Three times a day (TID) | ORAL | Status: DC

## 2015-03-25 NOTE — Telephone Encounter (Signed)
Pt noted to be allergic to PCN  I called pharmacy - cancel augmentin  Sent new rx for cleocin erx

## 2015-03-27 ENCOUNTER — Ambulatory Visit (HOSPITAL_COMMUNITY)
Admission: RE | Admit: 2015-03-27 | Discharge: 2015-03-27 | Disposition: A | Discharge status: Home / Self Care | Payer: Medicare Other | Source: Ambulatory Visit | Attending: Nurse Practitioner | Admitting: Nurse Practitioner

## 2015-03-27 DIAGNOSIS — C50412 Malignant neoplasm of upper-outer quadrant of left female breast: Secondary | ICD-10-CM | POA: Insufficient documentation

## 2015-03-27 DIAGNOSIS — K118 Other diseases of salivary glands: Secondary | ICD-10-CM | POA: Diagnosis not present

## 2015-03-27 MED ORDER — IOHEXOL 300 MG/ML  SOLN
100.0000 mL | Freq: Once | INTRAMUSCULAR | Status: AC | PRN
  Administered 2015-03-27: 100 mL via INTRAVENOUS

## 2015-03-28 ENCOUNTER — Telehealth: Payer: Self-pay | Admitting: *Deleted

## 2015-03-28 NOTE — Telephone Encounter (Signed)
Received call from pt stating that she saw Selena Lesser & would like to talk to her about results of CT.  She states she is anxious & is on ATB & has a lump in her neck & it is bad.  Informed that a message would be sent to Dr Jana Hakim to discuss with her.  She can be reached at home [phone].  Message routed to MD & POD RN

## 2015-03-29 NOTE — Telephone Encounter (Signed)
Received TC from patient today , requesting results of recent scans.  Advised patient that she has an appointment with Gentry Fitz, NP on Monday 04/03/15 and the results will be discussed with her in detail then, along with any further steps that might need to be taken. Pt states the lump on her neck is a little bit smaller since starting antibiotics. Pt voiced understanding.

## 2015-04-03 ENCOUNTER — Other Ambulatory Visit: Payer: Self-pay | Admitting: Oncology

## 2015-04-03 ENCOUNTER — Encounter: Payer: Self-pay | Admitting: Nurse Practitioner

## 2015-04-03 ENCOUNTER — Ambulatory Visit (HOSPITAL_BASED_OUTPATIENT_CLINIC_OR_DEPARTMENT_OTHER): Payer: Medicare Other | Admitting: Nurse Practitioner

## 2015-04-03 VITALS — BP 134/74 | HR 78 | Temp 97.8°F | Resp 18 | Wt 288.1 lb

## 2015-04-03 DIAGNOSIS — K118 Other diseases of salivary glands: Secondary | ICD-10-CM

## 2015-04-03 DIAGNOSIS — C50412 Malignant neoplasm of upper-outer quadrant of left female breast: Secondary | ICD-10-CM | POA: Diagnosis not present

## 2015-04-03 DIAGNOSIS — G4730 Sleep apnea, unspecified: Secondary | ICD-10-CM | POA: Diagnosis not present

## 2015-04-03 DIAGNOSIS — E876 Hypokalemia: Secondary | ICD-10-CM | POA: Diagnosis not present

## 2015-04-03 DIAGNOSIS — R220 Localized swelling, mass and lump, head: Secondary | ICD-10-CM | POA: Diagnosis not present

## 2015-04-03 DIAGNOSIS — R590 Localized enlarged lymph nodes: Secondary | ICD-10-CM

## 2015-04-03 DIAGNOSIS — Z170 Estrogen receptor positive status [ER+]: Secondary | ICD-10-CM

## 2015-04-03 NOTE — Progress Notes (Signed)
ID: [name]   DOB: [birth year]  MR#: [id number]  OVF#:[id number]  PCP: Cathlean Cower, MD GYN: SUDarrick Meigs Streck/[facility] Dalbert Batman OTHER MD: Nena Polio, [place] Nakayama, Casandra Doffing, [place] COMPLAINT:  Left Breast Cancer  CURRENT TREATMENT: Letrozole  BREAST CANCER HISTORY: From the original intake note:  [name] had an unremarkable screening mammogram August of 79  On 12/18/2010 however a potential abnormality was noted in the left breast and she was recalled for additional views 08/16.  Dr. Marcelo Baldy was able to demonstrate a focal irregular density in the upper outer quadrant of the left breast associated with linear microcalcifications.  By ultrasound a 9 mm hypoechoic mass was noted at the corresponding area.  This warranted biopsy and a biopsy was performed under ultrasound guidance 08/21.  The pathology showed ([id number]) an invasive lobular carcinoma, E cadherin negative, associated with ductal carcinoma in situ.  The invasive lobular carcinoma was HER-2 negative with a ratio by CISH of 1.23.  Estrogen receptor was 100% positive, progesterone receptor was 100% positive and the proliferation marker was 14%.  With this information the patient was referred to Dr. Margot Chimes and bilateral breast MRIs were obtained 12/28/2010.  In the upper outer quadrant of the left breast there was a small irregular nodule measuring 1.2 cm maximally.  There were no other suspicious abnormalities in the left breast or in the right breast.  There were no suspicious internal mammary or axillary lymph nodes identified.  With this information the patient is referred for further evaluation and treatment.  Further treatment is as detailed below.  INTERVAL HISTORY: [name] returns today for follow up of her breast cancer, accompanied by her husband [name]. She is here to review the result of her recent neck and chest CT. This was performed last week due to left neck swelling and tenderness since October. It  has somewhat improved since starting a course of amoxicillin. Left neck is still swollen, but the enlarged cervical chain lymph nodes have resolved. She denies fevers or chills.  REVIEW OF SYSTEMS Jasreet denies nausea, vomiting, or changes in bowel or bladder habits. She continues to try to lose weight to be eligible for a knee replacement. She is down another 5lb since her last visit 6 months ago. Besides knee pain, she has shoulder pain from relying on her walker so hard to ambulate. She has a history of asthma and shortness of breath with exertion, but denies chest pain, cough, or palpitations. She has a history of afib, but this has been asymptomatic lately. She has cut her atenolol dose in half. She continues to use diflucan PRN for the yeast rashes that appear to her groin, axilla, and inframammary areas. A detailed review of systems is otherwise stable.   PAST MEDICAL HISTORY: Past Medical History  Diagnosis Date  . Anxiety   . Allergy   . Asthma   . 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 Incontinence of urine   . Plantar fasciitis   . Breast cancer ([place]) Receptor + her2 _ 12/26/2010    left  . Gout     post operative  . Atrial fibrillation ([place]) 01/21/11  . Malignant neoplasm of upper-outer quadrant of female breast ([place]) 12/26/2010  . Hx of radiation therapy 04/02/11 -05/20/11    left breast  . COPD (chronic obstructive pulmonary disease) ([place])   . Sleep apnea   . Hypothyroidism 03/24/2015  . Gout 03/24/2015  . Essential hypertension 03/24/2015  . Asthma  03/24/2015  . Peripheral neuropathy ([place]) 03/24/2015  . Osteoporosis 03/24/2015  . OSA (obstructive sleep apnea) 03/24/2015  The past medical history is significant for hypertension, asthma, hypothyroidism, osteoarthritis/degenerative disk disease with particular problems with her right knee, history of depression, history of paresthesias, hypercholesterolemia, allergic rhinitis, osteopenia, status post tonsillectomy, status post  cholecystectomy, status post right hand surgery, history of fractures to the arms and legs, history of coronary artery disease status post stenting of her right coronary with a good cath report in 2001.  She is status post D and C and cryosurgery.  PAST SURGICAL HISTORY: Past Surgical History  Procedure Laterality Date  . Tonsillectomy  1955  . Cholecystectomy  1955  . Hand surgery  1992    tendons between thumb and forefinger  . Coronary angioplasty with stent placement  2000  . Skin biopsy      1994, 2006, 2008, 2010, 2011, 2011, 2012-  pre-cancerous  . Dilation and curettage of uterus  1976    and 1996  . Breast lumpectomy w/ needle localization  01/29/2011    Left with [facility] Dr Margot Chimes    FAMILY HISTORY Family History  Problem Relation Age of Onset  . Heart disease Father   . Cancer Father     intestinal polyps  . Asthma Sister   . Heart disease Brother   The patient's father had surgery for cancerous polyps in the colon at age 75.  He died at age 31 from a heart attack.  The patient's mother died at age 39.  The patient has 1 brother alive at age 79 and in good health.  She has 1 sister also in good health.  There is no family history of breast or ovarian cancer.  GYNECOLOGIC HISTORY: She had menarche at age 56, last period in 36.  She took hormone replacement for less than a year, stopping in 1996.  She carried 3 children to term, the first one at age 61.  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  She is also an active Nurse, adult.  She is now retired.  Her husband, Tammee Thielke, is present today as are her daughters, Sinkler and [name].  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  [name] also lives in [place] and is an Optometrist.  Son, [name], died at the age of 70 from what seems to have been a heart attack.  The patient has 2  grandchildren.   ADVANCED DIRECTIVES: In place  HEALTH MAINTENANCE: (Updated 02/18/2013) Social History  Substance Use Topics  . Smoking status: Former Smoker -- 1.00 packs/day    Quit date: 12/05/1962  . Smokeless tobacco: Never Used  . Alcohol Use: No     Colonoscopy:  PAP:  Bone density: Not on file  Lipid panel: Sept 2014, Dr. Irish Lack    Allergies  Allergen Reactions  . Crestor [Rosuvastatin Calcium]     Severe liver function problems  . Lipitor [Atorvastatin Calcium] Other (See Comments)    Elevated liver function   . Vicodin [Hydrocodone-Acetaminophen] Itching    Itching all over  . Codeine Nausea Only and Anxiety    Also complained of dizziness.  Has same problems with oxycodone and hydrocodone  . Quinine Derivatives Nausea Only    dizzy  . Sulfa Antibiotics Nausea Only    Also complained of dizziness  . Keflex [Cephalexin]   . Epinephrine  Other (See Comments)    Patient becomes "shaky"  . Penicillins Rash    Pt reported all over body rash in 1958 Has patient had a PCN reaction causing immediate rash, facial/tongue/throat swelling, SOB or lightheadedness with hypotension:Yes Has patient had a PCN reaction causing severe rash involving mucus membranes or skin necrosis: No Has patient had a PCN reaction that required hospitalization: No Has patient had a PCN reaction occurring within the last 10 years:No    . Theophyllines Other (See Comments)    Patient becomes "shaky"    Current Outpatient Prescriptions  Medication Sig Dispense Refill  . alendronate (FOSAMAX) 70 MG tablet Take 70 mg by mouth once a week. Patient takes on Saturday    . allopurinol (ZYLOPRIM) 100 MG tablet Take 300 mg by mouth daily.     Marland Kitchen ALPRAZolam (XANAX) 0.5 MG tablet Take 0.5-1 mg by mouth 2 (two) times daily as needed for anxiety or sleep.     Marland Kitchen amoxicillin-clavulanate (AUGMENTIN) 875-125 MG tablet Take 1 tablet by mouth 2 (two) times daily. for 10 days  0  . atenolol (TENORMIN) 25 MG  tablet Take 1 tablet (25 mg total) by mouth daily.    . Biotin 5000 MCG CAPS Take 5,000 mcg by mouth daily.     . cholecalciferol (VITAMIN D) 1000 UNITS tablet Take 1,000 Units by mouth daily.    Marland Kitchen co-enzyme Q-10 50 MG capsule Take 400 mg by mouth daily.    . fexofenadine (ALLEGRA) 180 MG tablet Take 180 mg by mouth daily.      . fluticasone (FLONASE) 50 MCG/ACT nasal spray Place 1 spray into both nostrils daily.     . Fluticasone-Salmeterol (ADVAIR) 500-50 MCG/DOSE AEPB Inhale 1 puff into the lungs every 12 (twelve) hours.      . gabapentin (NEURONTIN) 100 MG capsule Take 100 mg by mouth daily.  0  . hydrochlorothiazide 25 MG tablet Take 50 mg by mouth daily.     Marland Kitchen KLOR-CON M20 20 MEQ tablet Take 20 mEq by mouth daily.     Marland Kitchen letrozole (FEMARA) 2.5 MG tablet Take 1 tablet (2.5 mg total) by mouth daily. 90 tablet 2  . levocetirizine (XYZAL) 5 MG tablet Take 5 mg by mouth at bedtime.  0  . levothyroxine (SYNTHROID, LEVOTHROID) 50 MCG tablet Take 50 mcg by mouth daily.      . montelukast (SINGULAIR) 10 MG tablet Take 10 mg by mouth at bedtime.      . rivaroxaban (XARELTO) 20 MG TABS tablet Take 1 tablet (20 mg total) by mouth daily. 90 tablet 3  . traMADol (ULTRAM) 50 MG tablet Take 50 mg by mouth every 12 (twelve) hours as needed for moderate pain.     . traMADol-acetaminophen (ULTRACET) 37.5-325 MG tablet Take 1-2 tablets by mouth every 6 (six) hours as needed for moderate pain or severe pain.   0  . vitamin C (ASCORBIC ACID) 500 MG tablet Take 500 mg by mouth daily.    Marland Kitchen albuterol (PROVENTIL HFA;VENTOLIN HFA) 108 (90 BASE) MCG/ACT inhaler Inhale 2 puffs into the lungs as needed for wheezing or shortness of breath.     . clindamycin (CLEOCIN) 300 MG capsule Take 1 capsule (300 mg total) by mouth 3 (three) times daily. (Patient not taking: Reported on 04/03/2015) 30 capsule 0  . colchicine (COLCRYS) 0.6 MG tablet Take 0.6 mg by mouth daily as needed (flare  up).     . escitalopram (LEXAPRO) 10 MG  tablet Take 10 mg  by mouth daily.  0  . ezetimibe (ZETIA) 10 MG tablet Take 0.5-1 tablets (5-10 mg total) by mouth daily. (Patient not taking: Reported on 04/03/2015) 90 tablet 3  . magic mouthwash w/lidocaine SOLN Take 5 mLs by mouth 3 (three) times daily as needed for mouth pain.    Marland Kitchen nystatin cream (MYCOSTATIN) Apply 1 application topically daily as needed for dry skin.   0  . rosuvastatin (CRESTOR) 10 MG tablet Take 1 tablet (10 mg total) by mouth 2 (two) times a week. As of 02/18/2013, taking 2 x weekly (Patient not taking: Reported on 04/03/2015) 10 tablet 11   No current facility-administered medications for this visit.    OBJECTIVE: Elderly white woman examined in a wheelchair Filed Vitals:   04/03/15 1530  BP: 134/74  Pulse: 78  Temp: 97.8 F (36.6 C)  Resp: 18     Body mass index is 47.94 kg/(m^2).    ECOG FS: 2 Filed Weights   04/03/15 1530  Weight: 288 lb 1.6 oz (130.681 kg)   Skin: warm, dry  HEENT: sclerae anicteric, conjunctivae pink, oropharynx clear. No thrush or mucositis. Tenderness to left parotid gland, mild swelling. Lymph Nodes: No cervical or supraclavicular lymphadenopathy  Lungs: clear to auscultation bilaterally, no rales, wheezes, or rhonci  Heart: regular rate and rhythm  Abdomen: obsese, soft, non tender, positive bowel sounds  Musculoskeletal: No focal spinal tenderness, no peripheral edema  Neuro: non focal, well oriented, positive affect  Breasts: deferred   LAB RESULTS: Lab Results  Component Value Date   WBC 7.2 03/14/2015   NEUTROABS 4.2 03/14/2015   HGB 13.7 03/14/2015   HCT 42.0 03/14/2015   MCV 97.4 03/14/2015   PLT 157 03/14/2015      Chemistry      Component Value Date/Time   NA 145 03/14/2015 1352   NA 141 07/22/2011 0957   K 3.9 03/14/2015 1352   K 3.6 07/22/2011 0957   CL 105 07/15/2012 1409   CL 104 07/22/2011 0957   CO2 28 03/14/2015 1352   CO2 25 07/22/2011 0957   BUN 14.4 03/14/2015 1352   BUN 15 07/22/2011 0957    CREATININE 0.8 03/14/2015 1352   CREATININE 0.87 07/22/2011 0957      Component Value Date/Time   CALCIUM 9.8 03/14/2015 1352   CALCIUM 9.5 07/22/2011 0957   ALKPHOS 78 03/14/2015 1352   ALKPHOS 70 03/07/2015 1027   AST 24 03/14/2015 1352   AST 22 03/07/2015 1027   ALT 26 03/14/2015 1352   ALT 18 03/07/2015 1027   BILITOT 1.74* 03/14/2015 1352   BILITOT 1.6* 03/07/2015 1027       Lab Results  Component Value Date   LABCA2 17 07/15/2012   STUDIES: Ct Soft Tissue Neck W Contrast  03/27/2015  CLINICAL DATA:  Left-sided cervical lymph nodes/swelling with pain since 02/2015. History of left upper outer breast cancer diagnosed in 2014 status post lumpectomy and radiation therapy. EXAM: CT NECK WITH CONTRAST TECHNIQUE: Multidetector CT imaging of the neck was performed using the standard protocol following the bolus administration of intravenous contrast. CONTRAST:  113mL OMNIPAQUE IOHEXOL 300 MG/ML  SOLN COMPARISON:  Maxillofacial CT 12/30/2006 FINDINGS: Pharynx and larynx: The nasopharynx, oropharynx, oral cavity, hypopharynx, and larynx are unremarkable. Salivary glands: Submandibular glands and right parotid gland are unremarkable. There is a lesion in the inferior aspect of the left parotid gland which measures 2.1 x 1.7 x 2.1 cm and demonstrates predominantly peripheral enhancement with lower density centrally. This corresponds  to the area of palpable abnormality indicated by the skin marker and is new from the 2008 maxillofacial CT. There is more ill-defined soft tissue thickening are stranding surrounding the lesion without other parotid masses. Thyroid: Unremarkable. Lymph nodes: No enlarged lymph nodes are identified in the neck by CT criteria outside of the left parotid mass, although there are a few small left level II and III lymph nodes which measure 4-6 mm in short axis and are slightly asymmetric compared to the contralateral side. Vascular: Mixed calcified and noncalcified  plaque at the right carotid bifurcation results in approximately 50% proximal ICA stenosis. There is less than 50% narrowing of the proximal left ICA due to noncalcified plaque. A partially retropharyngeal course of the left common carotid artery is noted. Major vascular structures of the neck appear patent. Limited intracranial: The visualized portion of the brain is unremarkable. Visualized orbits: Prior bilateral cataract extraction. Mastoids and visualized paranasal sinuses: Trace left mastoid effusion. Visualized paranasal sinuses are clear. Skeleton: Advanced multilevel cervical facet arthrosis, worse on the right side and with ankylosis on the right at C2-3 and C3-4. Cervical disc degeneration predominantly at C5-6. No suspicious lytic or blastic osseous lesions. Upper chest: Evaluated on concurrent dedicated chest CT. IMPRESSION: 1. 2.1 cm left parotid mass, new from 2008. Leading considerations include primary parotid neoplasm versus abnormal intraparotid lymph node either from metastatic disease or less likely a focal inflammatory/infectious process. 2. No definite abnormal lymph nodes elsewhere in the neck. Electronically Signed   By: Logan Bores M.D.   On: 03/27/2015 09:09   Ct Chest W Contrast  03/27/2015  CLINICAL DATA:  Subsequent encounter for left breast cancer with new left neck swelling for 1 month. EXAM: CT CHEST WITH CONTRAST TECHNIQUE: Multidetector CT imaging of the chest was performed during intravenous contrast administration. CONTRAST:  144mL OMNIPAQUE IOHEXOL 300 MG/ML  SOLN COMPARISON:  None. FINDINGS: Mediastinum / Lymph Nodes: There is no axillary lymphadenopathy. No mediastinal lymphadenopathy. There is no hilar lymphadenopathy. Heart is enlarged. Coronary artery calcification is noted. The esophagus has normal imaging features. Lungs / Pleura: Subsegmental atelectasis noted in the lung bases dependently. No focal airspace consolidation. No suspicious pulmonary nodule or mass.  Upper Abdomen: 5.1 cm water density lesion in the upper pole left kidney is been incompletely visualized but may represent a cyst. No adrenal nodule or mass. Small gastrohepatic lymph nodes evident without visualized lymphadenopathy in the upper abdomen. MSK / Soft Tissues: Bone windows reveal no worrisome lytic or sclerotic osseous lesions. Degenerative changes with intra-articular loose bodies noted in the right shoulder. IMPRESSION: 1. No evidence for metastatic disease. Specifically, No evidence for lymphadenopathy in the mediastinum or left supraclavicular region. Electronically Signed   By: Misty Stanley M.D.   On: 03/27/2015 09:15    ASSESSMENT: 79 y.o.  [place] woman   (1)  status post left lumpectomy and sentinel lymph node sampling 01/29/2011 for a T1BN0 (stage I) invasive lobular breast cancer, grade 2, estrogen receptor 100% positive, progesterone receptor 100% positive, with no HER-2 amplification, and then MIB-1 of 14%;   (2)  s/p radiation completed January 2013,   (3)  on letrozole as of February 2013, the plan being to continue for total of 5 years (to February 2018)  (4)  mild hypokalemia   (5)  possible lymphadenopathy in the left posterior cervical chain--no significant finding on [place] October 2014  (6) morbid obesity  (7) sleep apnea   PLAN:  Lelon Frohlich and I reviewed the result of  her CT scans. They noted a 2.1 cm left parotid mass that was either a primary parotid neoplasm, abnormal lymph node, or some focal infectious process, otherwise negative for evidence of metastatic disease. Dr. Jana Hakim has already placed a referral to Dr. Erik Obey for an ENT evaluation. In the meantime, Jessilynn is to finish out the rest of her amoxicillin prescription.   [place] will return in June for her regularly scheduled breast cancer follow up. She will continue on letrozole daily. She understands and agrees with this plan. She knows the goal of treatment in her case is cure. She has been encouraged to  call with any issues that might arise before her next visit here.  Laurie Panda, NP   04/03/2015

## 2015-04-05 ENCOUNTER — Telehealth: Payer: Self-pay | Admitting: Oncology

## 2015-04-05 NOTE — Telephone Encounter (Signed)
Called patient and she is aware of her appointment 04/10/15 2:00 with dr Wilburn Cornelia at [facility]

## 2015-04-10 ENCOUNTER — Ambulatory Visit (INDEPENDENT_AMBULATORY_CARE_PROVIDER_SITE_OTHER): Payer: Medicare Other | Admitting: Ophthalmology

## 2015-04-10 DIAGNOSIS — D3132 Benign neoplasm of left choroid: Secondary | ICD-10-CM

## 2015-04-10 DIAGNOSIS — D3131 Benign neoplasm of right choroid: Secondary | ICD-10-CM | POA: Diagnosis not present

## 2015-04-10 DIAGNOSIS — H43813 Vitreous degeneration, bilateral: Secondary | ICD-10-CM | POA: Diagnosis not present

## 2015-06-12 ENCOUNTER — Encounter: Payer: Self-pay | Admitting: Adult Health

## 2015-06-12 ENCOUNTER — Ambulatory Visit (INDEPENDENT_AMBULATORY_CARE_PROVIDER_SITE_OTHER): Payer: Medicare Other | Admitting: Adult Health

## 2015-06-12 VITALS — BP 120/84 | HR 69 | Temp 98.6°F | Ht 65.0 in | Wt 288.0 lb

## 2015-06-12 DIAGNOSIS — J45901 Unspecified asthma with (acute) exacerbation: Secondary | ICD-10-CM | POA: Diagnosis not present

## 2015-06-12 MED ORDER — METHYLPREDNISOLONE 4 MG PO TBPK: ORAL_TABLET | ORAL | Status: DC

## 2015-06-12 MED ORDER — IPRATROPIUM-ALBUTEROL 0.5-2.5 (3) MG/3ML IN SOLN
3.0000 mL | Freq: Once | RESPIRATORY_TRACT | Status: AC
  Administered 2015-06-12: 3 mL via RESPIRATORY_TRACT

## 2015-06-12 MED ORDER — ALBUTEROL SULFATE (2.5 MG/3ML) 0.083% IN NEBU: 2.5000 mg | INHALATION_SOLUTION | Freq: Four times a day (QID) | RESPIRATORY_TRACT | Status: DC | PRN

## 2015-06-12 MED ORDER — DOXYCYCLINE HYCLATE 100 MG PO CAPS: 100.0000 mg | ORAL_CAPSULE | Freq: Two times a day (BID) | ORAL | Status: DC

## 2015-06-12 NOTE — Patient Instructions (Addendum)
It was great meeting you today.   I have sent in a prescription for a Medrol Dose Pack, Doxycyline,  as well as a new prescription for your Nebulizer medication.   Continue to use your inhalers as prescribed.   Follow up with PCP: if needed    Asthma, Adult Asthma is a recurring condition in which the airways tighten and narrow. Asthma can make it difficult to breathe. It can cause coughing, wheezing, and shortness of breath. Asthma episodes, also called asthma attacks, range from minor to life-threatening. Asthma cannot be cured, but medicines and lifestyle changes can help control it. CAUSES Asthma is believed to be caused by inherited (genetic) and environmental factors, but its exact cause is unknown. Asthma may be triggered by allergens, lung infections, or irritants in the air. Asthma triggers are different for each person. Common triggers include:   Animal dander.  Dust mites.  Cockroaches.  Pollen from trees or grass.  Mold.  Smoke.  Air pollutants such as dust, household cleaners, hair sprays, aerosol sprays, paint fumes, strong chemicals, or strong odors.  Cold air, weather changes, and winds (which increase molds and pollens in the air).  Strong emotional expressions such as crying or laughing hard.  Stress.  Certain medicines (such as aspirin) or types of drugs (such as beta-blockers).  Sulfites in foods and drinks. Foods and drinks that may contain sulfites include dried fruit, potato chips, and sparkling grape juice.  Infections or inflammatory conditions such as the flu, a cold, or an inflammation of the nasal membranes (rhinitis).  Gastroesophageal reflux disease (GERD).  Exercise or strenuous activity. SYMPTOMS Symptoms may occur immediately after asthma is triggered or many hours later. Symptoms include:  Wheezing.  Excessive nighttime or early morning coughing.  Frequent or severe coughing with a common cold.  Chest tightness.  Shortness of  breath. DIAGNOSIS  The diagnosis of asthma is made by a review of your medical history and a physical exam. Tests may also be performed. These may include:  Lung function studies. These tests show how much air you breathe in and out.  Allergy tests.  Imaging tests such as X-rays. TREATMENT  Asthma cannot be cured, but it can usually be controlled. Treatment involves identifying and avoiding your asthma triggers. It also involves medicines. There are 2 classes of medicine used for asthma treatment:   Controller medicines. These prevent asthma symptoms from occurring. They are usually taken every day.  Reliever or rescue medicines. These quickly relieve asthma symptoms. They are used as needed and provide short-term relief. Your health care provider will help you create an asthma action plan. An asthma action plan is a written plan for managing and treating your asthma attacks. It includes a list of your asthma triggers and how they may be avoided. It also includes information on when medicines should be taken and when their dosage should be changed. An action plan may also involve the use of a device called a peak flow meter. A peak flow meter measures how well the lungs are working. It helps you monitor your condition. HOME CARE INSTRUCTIONS   Take medicines only as directed by your health care provider. Speak with your health care provider if you have questions about how or when to take the medicines.  Use a peak flow meter as directed by your health care provider. Record and keep track of readings.  Understand and use the action plan to help minimize or stop an asthma attack without needing to seek medical  care.  Control your home environment in the following ways to help prevent asthma attacks:  Do not smoke. Avoid being exposed to secondhand smoke.  Change your heating and air conditioning filter regularly.  Limit your use of fireplaces and wood stoves.  Get rid of pests (such as  roaches and mice) and their droppings.  Throw away plants if you see mold on them.  Clean your floors and dust regularly. Use unscented cleaning products.  Try to have someone else vacuum for you regularly. Stay out of rooms while they are being vacuumed and for a short while afterward. If you vacuum, use a dust mask from a hardware store, a double-layered or microfilter vacuum cleaner bag, or a vacuum cleaner with a HEPA filter.  Replace carpet with wood, tile, or vinyl flooring. Carpet can trap dander and dust.  Use allergy-proof pillows, mattress covers, and box spring covers.  Wash bed sheets and blankets every week in hot water and dry them in a dryer.  Use blankets that are made of polyester or cotton.  Clean bathrooms and kitchens with bleach. If possible, have someone repaint the walls in these rooms with mold-resistant paint. Keep out of the rooms that are being cleaned and painted.  Wash hands frequently. SEEK MEDICAL CARE IF:   You have wheezing, shortness of breath, or a cough even if taking medicine to prevent attacks.  The colored mucus you cough up (sputum) is thicker than usual.  Your sputum changes from clear or white to yellow, green, gray, or bloody.  You have any problems that may be related to the medicines you are taking (such as a rash, itching, swelling, or trouble breathing).  You are using a reliever medicine more than 2-3 times per week.  Your peak flow is still at 50-79% of your personal best after following your action plan for 1 hour.  You have a fever. SEEK IMMEDIATE MEDICAL CARE IF:   You seem to be getting worse and are unresponsive to treatment during an asthma attack.  You are short of breath even at rest.  You get short of breath when doing very little physical activity.  You have difficulty eating, drinking, or talking due to asthma symptoms.  You develop chest pain.  You develop a fast heartbeat.  You have a bluish color to your  lips or fingernails.  You are light-headed, dizzy, or faint.  Your peak flow is less than 50% of your personal best.   This information is not intended to replace advice given to you by your health care provider. Make sure you discuss any questions you have with your health care provider.   Document Released: 04/22/2005 Document Revised: 01/11/2015 Document Reviewed: 11/19/2012 Elsevier Interactive Patient Education [employer].

## 2015-06-12 NOTE — Progress Notes (Signed)
Subjective:    Patient ID: [name], female    DOB: August 31, 1935, 80 y.o.   MRN: [id number]  HPI  80 year old female who presents to the office today for an acute asthma flare that started last night. When she first started to feel the asthma flare coming on she used her inhaler and nebulizer( medication expired) and took 2 pills of prednisone ( unsure of dose). She woke up this morning feeling better but still felt as though she was not getting enough oxygen and was wheezing.   Her last asthma flare was a year ago.   Review of Systems  Constitutional: Negative.   HENT: Negative.   Respiratory: Positive for chest tightness, shortness of breath and wheezing. Negative for choking and stridor.   Cardiovascular: Negative.   Neurological: Negative.    Past Medical History  Diagnosis Date  . Anxiety   . Allergy   . Asthma   . 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 Incontinence of urine   . Plantar fasciitis   . 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 Gout     post operative  . Atrial fibrillation ([place]) 01/21/11  . Malignant neoplasm of upper-outer quadrant of female breast ([place]) 12/26/2010  . Hx of radiation therapy 04/02/11 -05/20/11    left breast  . COPD (chronic obstructive pulmonary disease) ([place])   . Sleep apnea   . Hypothyroidism 03/24/2015  . Gout 03/24/2015  . Essential hypertension 03/24/2015  . Asthma 03/24/2015  . Peripheral neuropathy ([place]) 03/24/2015  . Osteoporosis 03/24/2015  . OSA (obstructive sleep apnea) 03/24/2015    Social History   Social History  . Marital Status: Married    Spouse Name: N/A  . Number of Children: N/A  . Years of Education: N/A   Occupational History  . Not on file.   Social History Main Topics  . Smoking status: Former Smoker -- 1.00 packs/day    Quit date: 12/05/1962  . Smokeless tobacco: Never Used  . Alcohol Use: No  . Drug Use: No  . Sexual Activity: Not on file   Other Topics Concern  . Not on  file   Social History Narrative    Past Surgical History  Procedure Laterality Date  . Tonsillectomy  1955  . Cholecystectomy  1955  . Hand surgery  1992    tendons between thumb and forefinger  . Coronary angioplasty with stent placement  2000  . Skin biopsy      1994, 2006, 2008, 2010, 2011, 2011, 2012-  pre-cancerous  . Dilation and curettage of uterus  1976    and 1996  . Breast lumpectomy w/ needle localization  01/29/2011    Left with [facility] Dr Margot Chimes    Family History  Problem Relation Age of Onset  . Heart disease Father   . Cancer Father     intestinal polyps  . Asthma Sister   . Heart disease Brother     Allergies  Allergen Reactions  . Crestor [Rosuvastatin Calcium]     Severe liver function problems  . Lipitor [Atorvastatin Calcium] Other (See Comments)    Elevated liver function   . Vicodin [Hydrocodone-Acetaminophen] Itching    Itching all over  . Codeine Nausea Only and Anxiety    Also complained of dizziness.  Has same problems with oxycodone and hydrocodone  . Quinine Derivatives Nausea Only    dizzy  . Sulfa Antibiotics Nausea  Only    Also complained of dizziness  . Keflex [Cephalexin]   . Epinephrine Other (See Comments)    Patient becomes "shaky"  . Penicillins Rash    Pt reported all over body rash in 1958 Has patient had a PCN reaction causing immediate rash, facial/tongue/throat swelling, SOB or lightheadedness with hypotension:Yes Has patient had a PCN reaction causing severe rash involving mucus membranes or skin necrosis: No Has patient had a PCN reaction that required hospitalization: No Has patient had a PCN reaction occurring within the last 10 years:No    . Theophyllines Other (See Comments)    Patient becomes "shaky"    Current Outpatient Prescriptions on File Prior to Visit  Medication Sig Dispense Refill  . albuterol (PROVENTIL HFA;VENTOLIN HFA) 108 (90 BASE) MCG/ACT inhaler Inhale 2 puffs into the lungs as needed for wheezing  or shortness of breath.     Marland Kitchen alendronate (FOSAMAX) 70 MG tablet Take 70 mg by mouth once a week. Patient takes on Saturday    . allopurinol (ZYLOPRIM) 100 MG tablet Take 300 mg by mouth daily.     Marland Kitchen ALPRAZolam (XANAX) 0.5 MG tablet Take 0.5-1 mg by mouth 2 (two) times daily as needed for anxiety or sleep.     Marland Kitchen atenolol (TENORMIN) 25 MG tablet Take 1 tablet (25 mg total) by mouth daily.    . Biotin 5000 MCG CAPS Take 5,000 mcg by mouth daily.     . cholecalciferol (VITAMIN D) 1000 UNITS tablet Take 1,000 Units by mouth daily.    Marland Kitchen co-enzyme Q-10 50 MG capsule Take 400 mg by mouth daily.    . colchicine (COLCRYS) 0.6 MG tablet Take 0.6 mg by mouth daily as needed (flare  up).     . escitalopram (LEXAPRO) 10 MG tablet Take 10 mg by mouth daily.  0  . ezetimibe (ZETIA) 10 MG tablet Take 0.5-1 tablets (5-10 mg total) by mouth daily. 90 tablet 3  . fexofenadine (ALLEGRA) 180 MG tablet Take 180 mg by mouth daily.      . fluticasone (FLONASE) 50 MCG/ACT nasal spray Place 1 spray into both nostrils daily.     . Fluticasone-Salmeterol (ADVAIR) 500-50 MCG/DOSE AEPB Inhale 1 puff into the lungs every 12 (twelve) hours.      . gabapentin (NEURONTIN) 100 MG capsule Take 100 mg by mouth daily.  0  . hydrochlorothiazide 25 MG tablet Take 50 mg by mouth daily.     Marland Kitchen KLOR-CON M20 20 MEQ tablet Take 20 mEq by mouth daily.     Marland Kitchen letrozole (FEMARA) 2.5 MG tablet Take 1 tablet (2.5 mg total) by mouth daily. 90 tablet 2  . levocetirizine (XYZAL) 5 MG tablet Take 5 mg by mouth at bedtime.  0  . levothyroxine (SYNTHROID, LEVOTHROID) 50 MCG tablet Take 50 mcg by mouth daily.      . magic mouthwash w/lidocaine SOLN Take 5 mLs by mouth 3 (three) times daily as needed for mouth pain.    . montelukast (SINGULAIR) 10 MG tablet Take 10 mg by mouth at bedtime.      Marland Kitchen nystatin cream (MYCOSTATIN) Apply 1 application topically daily as needed for dry skin.   0  . rivaroxaban (XARELTO) 20 MG TABS tablet Take 1 tablet (20 mg  total) by mouth daily. 90 tablet 3  . rosuvastatin (CRESTOR) 10 MG tablet Take 1 tablet (10 mg total) by mouth 2 (two) times a week. As of 02/18/2013, taking 2 x weekly 10 tablet 11  .  traMADol (ULTRAM) 50 MG tablet Take 50 mg by mouth every 12 (twelve) hours as needed for moderate pain.     . traMADol-acetaminophen (ULTRACET) 37.5-325 MG tablet Take 1-2 tablets by mouth every 6 (six) hours as needed for moderate pain or severe pain.   0  . vitamin C (ASCORBIC ACID) 500 MG tablet Take 500 mg by mouth daily.     No current facility-administered medications on file prior to visit.    BP 120/84 mmHg  Pulse 69  Temp(Src) 98.6 F (37 C) (Oral)  Ht '5\' 5"'$  (1.651 m)  Wt 288 lb (130.636 kg)  BMI 47.93 kg/m2  SpO2 97%       Objective:   Physical Exam  Constitutional: She appears well-developed and well-nourished. No distress.  Cardiovascular: Normal rate, regular rhythm, normal heart sounds and intact distal pulses.  Exam reveals no gallop and no friction rub.   No murmur heard. Pulmonary/Chest: Effort normal. No accessory muscle usage. No tachypnea. No respiratory distress. She has decreased breath sounds in the right upper field, the right middle field, the right lower field, the left upper field, the left middle field and the left lower field. She has wheezes in the right upper field, the right middle field, the right lower field, the left upper field, the left middle field and the left lower field. She has no rhonchi. She has no rales.  Skin: She is not diaphoretic.  Nursing note and vitals reviewed.      Assessment & Plan:  1. Asthma flare - DuoNeb neb given in the office. After neb, the patient endorses feeling as though she can breath better. She continued to be wheezy in full fields but was moving oxygen much better that prior to the Neb.  - ipratropium-albuterol (DUONEB) 0.5-2.5 (3) MG/3ML nebulizer solution 3 mL; Take 3 mLs by nebulization once. - albuterol (PROVENTIL) (2.5  MG/3ML) 0.083% nebulizer solution; Take 3 mLs (2.5 mg total) by nebulization every 6 (six) hours as needed for wheezing or shortness of breath.  Dispense: 75 mL; Refill: 12 - methylPREDNISolone (MEDROL DOSEPAK) 4 MG TBPK tablet; Take as directed  Dispense: 21 tablet; Refill: 0 - doxycycline (VIBRAMYCIN) 100 MG capsule; Take 1 capsule (100 mg total) by mouth 2 (two) times daily.  Dispense: 14 capsule; Refill: 0 - Follow up with PCP if no improvement. If symptoms become worse, please go to the ER

## 2015-06-12 NOTE — Progress Notes (Signed)
Pre visit review using our clinic review tool, if applicable. No additional management support is needed unless otherwise documented below in the visit note. 

## 2015-06-30 ENCOUNTER — Ambulatory Visit (INDEPENDENT_AMBULATORY_CARE_PROVIDER_SITE_OTHER): Payer: Medicare Other | Admitting: Internal Medicine

## 2015-06-30 ENCOUNTER — Other Ambulatory Visit (INDEPENDENT_AMBULATORY_CARE_PROVIDER_SITE_OTHER): Payer: Medicare Other

## 2015-06-30 ENCOUNTER — Encounter: Payer: Self-pay | Admitting: Internal Medicine

## 2015-06-30 VITALS — BP 130/64 | HR 61 | Temp 97.9°F | Resp 20 | Wt 302.0 lb

## 2015-06-30 DIAGNOSIS — Z0000 Encounter for general adult medical examination without abnormal findings: Secondary | ICD-10-CM | POA: Diagnosis not present

## 2015-06-30 DIAGNOSIS — J4520 Mild intermittent asthma, uncomplicated: Secondary | ICD-10-CM | POA: Diagnosis not present

## 2015-06-30 DIAGNOSIS — F418 Other specified anxiety disorders: Secondary | ICD-10-CM | POA: Diagnosis not present

## 2015-06-30 DIAGNOSIS — Z0001 Encounter for general adult medical examination with abnormal findings: Secondary | ICD-10-CM | POA: Insufficient documentation

## 2015-06-30 DIAGNOSIS — I10 Essential (primary) hypertension: Secondary | ICD-10-CM

## 2015-06-30 DIAGNOSIS — F419 Anxiety disorder, unspecified: Secondary | ICD-10-CM | POA: Insufficient documentation

## 2015-06-30 DIAGNOSIS — F32A Depression, unspecified: Secondary | ICD-10-CM

## 2015-06-30 DIAGNOSIS — F329 Major depressive disorder, single episode, unspecified: Secondary | ICD-10-CM | POA: Insufficient documentation

## 2015-06-30 LAB — URINALYSIS, ROUTINE W REFLEX MICROSCOPIC
BILIRUBIN URINE: NEGATIVE
HGB URINE DIPSTICK: NEGATIVE
Ketones, ur: NEGATIVE
NITRITE: NEGATIVE
Specific Gravity, Urine: 1.02 (ref 1.000–1.030)
TOTAL PROTEIN, URINE-UPE24: NEGATIVE
URINE GLUCOSE: NEGATIVE
Urobilinogen, UA: 0.2 (ref 0.0–1.0)
pH: 5.5 (ref 5.0–8.0)

## 2015-06-30 LAB — BASIC METABOLIC PANEL
BUN: 19 mg/dL (ref 6–23)
CALCIUM: 9.6 mg/dL (ref 8.4–10.5)
CHLORIDE: 105 meq/L (ref 96–112)
CO2: 31 mEq/L (ref 19–32)
CREATININE: 0.8 mg/dL (ref 0.40–1.20)
GFR: 73.76 mL/min (ref >60.00)
Glucose, Bld: 101 mg/dL — ABNORMAL HIGH (ref 70–99)
Potassium: 3.7 mEq/L (ref 3.5–5.1)
Sodium: 142 mEq/L (ref 135–145)

## 2015-06-30 LAB — CBC WITH DIFFERENTIAL/PLATELET
BASOS PCT: 0.3 % (ref 0.0–3.0)
Basophils Absolute: 0 10*3/uL (ref 0.0–0.1)
EOS ABS: 0.2 10*3/uL (ref 0.0–0.7)
EOS PCT: 3.1 % (ref 0.0–5.0)
HEMATOCRIT: 41.1 % (ref 36.0–46.0)
HEMOGLOBIN: 13.6 g/dL (ref 12.0–15.0)
LYMPHS PCT: 22.9 % (ref 12.0–46.0)
Lymphs Abs: 1.5 10*3/uL (ref 0.7–4.0)
MCHC: 33 g/dL (ref 30.0–36.0)
MCV: 95.4 fl (ref 78.0–100.0)
MONOS PCT: 9.9 % (ref 3.0–12.0)
Monocytes Absolute: 0.7 10*3/uL (ref 0.1–1.0)
NEUTROS ABS: 4.3 10*3/uL (ref 1.4–7.7)
Neutrophils Relative %: 63.8 % (ref 43.0–77.0)
PLATELETS: 137 10*3/uL — AB (ref 150.0–400.0)
RBC: 4.31 Mil/uL (ref 3.87–5.11)
RDW: 15.6 % — AB (ref 11.5–15.5)
WBC: 6.8 10*3/uL (ref 4.0–10.5)

## 2015-06-30 LAB — HEPATIC FUNCTION PANEL
ALT: 17 U/L (ref 0–35)
AST: 22 U/L (ref 0–37)
Albumin: 4.1 g/dL (ref 3.5–5.2)
Alkaline Phosphatase: 64 U/L (ref 39–117)
BILIRUBIN DIRECT: 0.2 mg/dL (ref 0.0–0.3)
BILIRUBIN TOTAL: 1.5 mg/dL — AB (ref 0.2–1.2)
Total Protein: 7.1 g/dL (ref 6.0–8.3)

## 2015-06-30 LAB — LIPID PANEL
CHOL/HDL RATIO: 3
Cholesterol: 209 mg/dL — ABNORMAL HIGH (ref 0–200)
HDL: 64.2 mg/dL (ref >39.00)
LDL CALC: 124 mg/dL — AB (ref 0–99)
NONHDL: 144.49
TRIGLYCERIDES: 104 mg/dL (ref 0.0–149.0)
VLDL: 20.8 mg/dL (ref 0.0–40.0)

## 2015-06-30 LAB — TSH: TSH: 2.17 u[IU]/mL (ref 0.35–4.50)

## 2015-06-30 MED ORDER — BUPROPION HCL ER (XL) 150 MG PO TB24: 150.0000 mg | ORAL_TABLET | Freq: Every day | ORAL | Status: DC

## 2015-06-30 NOTE — Progress Notes (Signed)
Pre visit review using our clinic review tool, if applicable. No additional management support is needed unless otherwise documented below in the visit note. 

## 2015-06-30 NOTE — Patient Instructions (Signed)
Please take all new medication as prescribed - the wellbutrin XL 150 mg per day  Please continue all other medications as before, and refills have been done if requested.  Please have the pharmacy call with any other refills you may need.  Please continue your efforts at being more active, low cholesterol diet, and weight control.  You are otherwise up to date with prevention measures today.  Please keep your appointments with your specialists as you may have planned  Please go to the LAB in the Basement (turn left off the elevator) for the tests to be done today  You will be contacted by phone if any changes need to be made immediately.  Otherwise, you will receive a letter about your results with an explanation, but please check with MyChart first.  Please remember to sign up for MyChart if you have not done so, as this will be important to you in the future with finding out test results, communicating by private email, and scheduling acute appointments online when needed.  Please return in 6 months, or sooner if needed, with Lab testing done 3-5 days before

## 2015-06-30 NOTE — Progress Notes (Signed)
Subjective:    Patient ID: [name], female    DOB: 1935/11/22, 80 y.o.   MRN: [id number]  HPI   Here for wellness and f/u;  Overall doing ok;  Pt denies Chest pain, worsening SOB, DOE, wheezing, orthopnea, PND, worsening LE edema, palpitations, dizziness or syncope.  Pt denies neurological change such as new headache, facial or extremity weakness.  Pt denies polydipsia, polyuria, or low sugar symptoms. Pt states overall good compliance with treatment and medications, good tolerability, and has been trying to follow appropriate diet.  Pt has had mild worsening depressive symptoms and nervousness,but no suicidal ideation or panic. No fever, night sweats, wt loss, loss of appetite, or other constitutional symptoms.  Pt states good ability with ADL's, has low fall risk, home safety reviewed and adequate, no other significant changes in hearing or vision, remains in wheelchair out of the home, but uses walker in the home.  Also, did have f/u visit with Dr Olga Coaster after parotid mass resolved apparently with antibx even before first visit.  Has had electrical nerve pains to the left face mostly at the temple area and the lateral corner of the left eye  Had asthma exac jan 2017, did well with tx. Past Medical History  Diagnosis Date  . Anxiety   . Allergy   . Asthma   . 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 Incontinence of urine   . Plantar fasciitis   . 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 Gout     post operative  . Atrial fibrillation ([place]) 01/21/11  . Malignant neoplasm of upper-outer quadrant of female breast ([place]) 12/26/2010  . Hx of radiation therapy 04/02/11 -05/20/11    left breast  . COPD (chronic obstructive pulmonary disease) ([place])   . Sleep apnea   . Hypothyroidism 03/24/2015  . Gout 03/24/2015  . Essential hypertension 03/24/2015  . Asthma 03/24/2015  . Peripheral neuropathy ([place]) 03/24/2015  . Osteoporosis 03/24/2015  . OSA (obstructive sleep apnea)  03/24/2015   Past Surgical History  Procedure Laterality Date  . Tonsillectomy  1955  . Cholecystectomy  1955  . Hand surgery  1992    tendons between thumb and forefinger  . Coronary angioplasty with stent placement  2000  . Skin biopsy      1994, 2006, 2008, 2010, 2011, 2011, 2012-  pre-cancerous  . Dilation and curettage of uterus  1976    and 1996  . Breast lumpectomy w/ needle localization  01/29/2011    Left with [facility] Dr Margot Chimes    reports that she quit smoking about 52 years ago. She has never used smokeless tobacco. She reports that she does not drink alcohol or use illicit drugs. family history includes Asthma in her sister; Cancer in her father; Heart disease in her brother and father. Allergies  Allergen Reactions  . Crestor [Rosuvastatin Calcium]     Severe liver function problems  . Lipitor [Atorvastatin Calcium] Other (See Comments)    Elevated liver function   . Vicodin [Hydrocodone-Acetaminophen] Itching    Itching all over  . Codeine Nausea Only and Anxiety    Also complained of dizziness.  Has same problems with oxycodone and hydrocodone  . Quinine Derivatives Nausea Only    dizzy  . Sulfa Antibiotics Nausea Only    Also complained of dizziness  . Keflex [Cephalexin]   . Epinephrine Other (See Comments)    Patient becomes "shaky"  .  Penicillins Rash    Pt reported all over body rash in 1958 Has patient had a PCN reaction causing immediate rash, facial/tongue/throat swelling, SOB or lightheadedness with hypotension:Yes Has patient had a PCN reaction causing severe rash involving mucus membranes or skin necrosis: No Has patient had a PCN reaction that required hospitalization: No Has patient had a PCN reaction occurring within the last 10 years:No    . Theophyllines Other (See Comments)    Patient becomes "shaky"   Current Outpatient Prescriptions on File Prior to Visit  Medication Sig Dispense Refill  . albuterol (PROVENTIL HFA;VENTOLIN HFA) 108 (90  BASE) MCG/ACT inhaler Inhale 2 puffs into the lungs as needed for wheezing or shortness of breath.     Marland Kitchen albuterol (PROVENTIL) (2.5 MG/3ML) 0.083% nebulizer solution Take 3 mLs (2.5 mg total) by nebulization every 6 (six) hours as needed for wheezing or shortness of breath. 75 mL 12  . alendronate (FOSAMAX) 70 MG tablet Take 70 mg by mouth once a week. Patient takes on Saturday    . allopurinol (ZYLOPRIM) 100 MG tablet Take 300 mg by mouth daily.     Marland Kitchen ALPRAZolam (XANAX) 0.5 MG tablet Take 0.5-1 mg by mouth 2 (two) times daily as needed for anxiety or sleep.     Marland Kitchen atenolol (TENORMIN) 25 MG tablet Take 1 tablet (25 mg total) by mouth daily.    . Biotin 5000 MCG CAPS Take 5,000 mcg by mouth daily.     . cholecalciferol (VITAMIN D) 1000 UNITS tablet Take 1,000 Units by mouth daily.    Marland Kitchen co-enzyme Q-10 50 MG capsule Take 400 mg by mouth daily.    . colchicine (COLCRYS) 0.6 MG tablet Take 0.6 mg by mouth daily as needed (flare  up).     . doxycycline (VIBRAMYCIN) 100 MG capsule Take 1 capsule (100 mg total) by mouth 2 (two) times daily. 14 capsule 0  . escitalopram (LEXAPRO) 10 MG tablet Take 10 mg by mouth daily.  0  . ezetimibe (ZETIA) 10 MG tablet Take 0.5-1 tablets (5-10 mg total) by mouth daily. 90 tablet 3  . fexofenadine (ALLEGRA) 180 MG tablet Take 180 mg by mouth daily.      . fluticasone (FLONASE) 50 MCG/ACT nasal spray Place 1 spray into both nostrils daily.     . Fluticasone-Salmeterol (ADVAIR) 500-50 MCG/DOSE AEPB Inhale 1 puff into the lungs every 12 (twelve) hours.      . gabapentin (NEURONTIN) 100 MG capsule Take 100 mg by mouth daily.  0  . hydrochlorothiazide 25 MG tablet Take 50 mg by mouth daily.     Marland Kitchen KLOR-CON M20 20 MEQ tablet Take 20 mEq by mouth daily.     Marland Kitchen letrozole (FEMARA) 2.5 MG tablet Take 1 tablet (2.5 mg total) by mouth daily. 90 tablet 2  . levocetirizine (XYZAL) 5 MG tablet Take 5 mg by mouth at bedtime.  0  . levothyroxine (SYNTHROID, LEVOTHROID) 50 MCG tablet Take  50 mcg by mouth daily.      . magic mouthwash w/lidocaine SOLN Take 5 mLs by mouth 3 (three) times daily as needed for mouth pain.    . methylPREDNISolone (MEDROL DOSEPAK) 4 MG TBPK tablet Take as directed 21 tablet 0  . montelukast (SINGULAIR) 10 MG tablet Take 10 mg by mouth at bedtime.      Marland Kitchen nystatin cream (MYCOSTATIN) Apply 1 application topically daily as needed for dry skin.   0  . rivaroxaban (XARELTO) 20 MG TABS tablet Take 1 tablet (  20 mg total) by mouth daily. 90 tablet 3  . rosuvastatin (CRESTOR) 10 MG tablet Take 1 tablet (10 mg total) by mouth 2 (two) times a week. As of 02/18/2013, taking 2 x weekly 10 tablet 11  . traMADol (ULTRAM) 50 MG tablet Take 50 mg by mouth every 12 (twelve) hours as needed for moderate pain.     . traMADol-acetaminophen (ULTRACET) 37.5-325 MG tablet Take 1-2 tablets by mouth every 6 (six) hours as needed for moderate pain or severe pain.   0  . vitamin C (ASCORBIC ACID) 500 MG tablet Take 500 mg by mouth daily.     No current facility-administered medications on file prior to visit.   Review of Systems Constitutional: Negative for increased diaphoresis, other activity, appetite or siginficant weight change other than noted HENT: Negative for worsening hearing loss, ear pain, facial swelling, mouth sores and neck stiffness.   Eyes: Negative for other worsening pain, redness or visual disturbance.  Respiratory: Negative for shortness of breath and wheezing currently Cardiovascular: Negative for chest pain and palpitations.  Gastrointestinal: Negative for diarrhea, blood in stool, abdominal distention or other pain Genitourinary: Negative for hematuria, flank pain or change in urine volume.  Musculoskeletal: Negative for myalgias or other joint complaints.  Skin: Negative for color change and wound or drainage.  Neurological: Negative for syncope and numbness. other than noted Hematological: Negative for adenopathy. or other  swelling Psychiatric/Behavioral: Negative for hallucinations, SI, self-injury, decreased concentration or other worsening agitation.     Objective:   Physical Exam BP 130/64 mmHg  Pulse 61  Temp(Src) 97.9 F (36.6 C) (Oral)  Resp 20  Wt 302 lb (136.986 kg)  SpO2 95% VS noted, motbid obese, examined in wheelchair Constitutional: Pt is oriented to person, place, and time. Appears well-developed and well-nourished, in no significant distress Head: Normocephalic and atraumatic.  Right Ear: External ear normal.  Left Ear: External ear normal.  Nose: Nose normal. Left face with vague nondiscrete soft tissue fullness compared to right but no specific mass or tenderness noted Mouth/Throat: Oropharynx is clear and moist.  Eyes: Conjunctivae and EOM are normal. Pupils are equal, round, and reactive to light.  Neck: Normal range of motion. Neck supple. No JVD present. No tracheal deviation present or significant neck LA or mass Cardiovascular: Normal rate, regular rhythm, normal heart sounds and intact distal pulses.   Pulmonary/Chest: Effort normal and breath sounds without rales or wheezing  Abdominal: Soft. Bowel sounds are normal. NT. No HSM  Musculoskeletal: Normal range of motion. Exhibits no edema.  Lymphadenopathy:  Has no cervical adenopathy.  Neurological: Pt is alert and oriented to person, place, and time. Pt has normal reflexes. No cranial nerve deficit. Motor grossly intact Skin: Skin is warm and dry. No rash noted.  Psychiatric:  Has nervous depressed mood and affect. Behavior is normal.     Assessment & Plan:

## 2015-07-01 NOTE — Assessment & Plan Note (Signed)
stable overall by history and exam, recent data reviewed with pt, and pt to continue medical treatment as before,  to f/u any worsening symptoms or concerns BP Readings from Last 3 Encounters:  06/30/15 130/64  06/12/15 120/84  04/03/15 134/74

## 2015-07-01 NOTE — Assessment & Plan Note (Signed)
Mild worsening recently, to add wellbutrin XL 150 qd,  to f/u any worsening symptoms or concerns

## 2015-07-01 NOTE — Assessment & Plan Note (Signed)
With recent flare now resolved, o/w stable overall by history and exam, recent data reviewed with pt, and pt to continue medical treatment as before,  to f/u any worsening symptoms or concerns SpO2 Readings from Last 3 Encounters:  06/30/15 95%  06/12/15 97%  04/03/15 98%

## 2015-07-01 NOTE — Assessment & Plan Note (Signed)

## 2015-07-10 ENCOUNTER — Telehealth: Payer: Self-pay | Admitting: Internal Medicine

## 2015-07-10 NOTE — Telephone Encounter (Signed)
[name] who is  Dr. Collene [name] asst from [facility] ophthalmology called to see if we received office notes. He will be refaxing them to [place]. He is also needing his lab results from his last visit and is wondering the diagnosis and why they were needed.  Can you please call him at  [phone] Fax [phone]

## 2015-07-10 NOTE — Telephone Encounter (Signed)
Please see encounter below

## 2015-07-20 ENCOUNTER — Ambulatory Visit (INDEPENDENT_AMBULATORY_CARE_PROVIDER_SITE_OTHER): Payer: Medicare Other | Admitting: Internal Medicine

## 2015-07-20 ENCOUNTER — Encounter: Payer: Self-pay | Admitting: Internal Medicine

## 2015-07-20 VITALS — BP 124/80 | HR 60 | Temp 97.8°F | Resp 20 | Wt 303.0 lb

## 2015-07-20 DIAGNOSIS — J029 Acute pharyngitis, unspecified: Secondary | ICD-10-CM | POA: Insufficient documentation

## 2015-07-20 DIAGNOSIS — I10 Essential (primary) hypertension: Secondary | ICD-10-CM | POA: Diagnosis not present

## 2015-07-20 DIAGNOSIS — J4531 Mild persistent asthma with (acute) exacerbation: Secondary | ICD-10-CM

## 2015-07-20 DIAGNOSIS — J45901 Unspecified asthma with (acute) exacerbation: Secondary | ICD-10-CM | POA: Insufficient documentation

## 2015-07-20 MED ORDER — LEVOFLOXACIN 250 MG PO TABS: 250.0000 mg | ORAL_TABLET | Freq: Every day | ORAL | Status: DC

## 2015-07-20 MED ORDER — PREDNISONE 10 MG PO TABS: ORAL_TABLET | ORAL | Status: DC

## 2015-07-20 MED ORDER — PREDNISONE 10 MG PO TABS: 10.0000 mg | ORAL_TABLET | Freq: Every day | ORAL | Status: DC

## 2015-07-20 MED ORDER — HYDROCODONE-HOMATROPINE 5-1.5 MG/5ML PO SYRP: 5.0000 mL | ORAL_SOLUTION | Freq: Four times a day (QID) | ORAL | Status: DC | PRN

## 2015-07-20 NOTE — Assessment & Plan Note (Signed)
Mild to mod, for antibx course,  to f/u any worsening symptoms or concerns 

## 2015-07-20 NOTE — Progress Notes (Signed)
Pre visit review using our clinic review tool, if applicable. No additional management support is needed unless otherwise documented below in the visit note. 

## 2015-07-20 NOTE — Patient Instructions (Signed)
You had the steroid shot today  Please take all new medication as prescribed - the antibiotic, cough medicine, and prednisone  Please continue all other medications as before, including your inhaler  Please have the pharmacy call with any other refills you may need.  Please keep your appointments with your specialists as you may have planned       

## 2015-07-20 NOTE — Progress Notes (Signed)
Subjective:    Patient ID: [name], female    DOB: 03-24-1936, 80 y.o.   MRN: [id number]  HPI    Here with acute onset mild to mod 2-3 days ST, HA, general weakness and malaise, with prod cough greenish sputum, but Pt denies chest pain, increased sob or doe, wheezing, orthopnea, PND, increased LE swelling, palpitations, dizziness or syncope.  Does have several wks ongoing nasal allergy symptoms with clearish congestion, itch and sneezing, without fever, pain, ST, cough, swelling or wheezing.  Pt denies new neurological symptoms such as new headache, or facial or extremity weakness or numbness   Pt denies polydipsia, polyuria Past Medical History  Diagnosis Date  . Anxiety   . Allergy   . Asthma   . 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 Incontinence of urine   . Plantar fasciitis   . 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 Gout     post operative  . Atrial fibrillation ([place]) 01/21/11  . Malignant neoplasm of upper-outer quadrant of female breast ([place]) 12/26/2010  . Hx of radiation therapy 04/02/11 -05/20/11    left breast  . COPD (chronic obstructive pulmonary disease) ([place])   . Sleep apnea   . Hypothyroidism 03/24/2015  . Gout 03/24/2015  . Essential hypertension 03/24/2015  . Asthma 03/24/2015  . Peripheral neuropathy ([place]) 03/24/2015  . Osteoporosis 03/24/2015  . OSA (obstructive sleep apnea) 03/24/2015   Past Surgical History  Procedure Laterality Date  . Tonsillectomy  1955  . Cholecystectomy  1955  . Hand surgery  1992    tendons between thumb and forefinger  . Coronary angioplasty with stent placement  2000  . Skin biopsy      1994, 2006, 2008, 2010, 2011, 2011, 2012-  pre-cancerous  . Dilation and curettage of uterus  1976    and 1996  . Breast lumpectomy w/ needle localization  01/29/2011    Left with [facility] Dr Margot Chimes    reports that she quit smoking about 52 years ago. She has never used smokeless tobacco. She reports that she does not drink  alcohol or use illicit drugs. family history includes Asthma in her sister; Cancer in her father; Heart disease in her brother and father. Allergies  Allergen Reactions  . Crestor [Rosuvastatin Calcium]     Severe liver function problems  . Lipitor [Atorvastatin Calcium] Other (See Comments)    Elevated liver function   . Vicodin [Hydrocodone-Acetaminophen] Itching    Itching all over  . Codeine Nausea Only and Anxiety    Also complained of dizziness.  Has same problems with oxycodone and hydrocodone  . Quinine Derivatives Nausea Only    dizzy  . Sulfa Antibiotics Nausea Only    Also complained of dizziness  . Keflex [Cephalexin]   . Epinephrine Other (See Comments)    Patient becomes "shaky"  . Penicillins Rash    Pt reported all over body rash in 1958 Has patient had a PCN reaction causing immediate rash, facial/tongue/throat swelling, SOB or lightheadedness with hypotension:Yes Has patient had a PCN reaction causing severe rash involving mucus membranes or skin necrosis: No Has patient had a PCN reaction that required hospitalization: No Has patient had a PCN reaction occurring within the last 10 years:No    . Theophyllines Other (See Comments)    Patient becomes "shaky"   Current Outpatient Prescriptions on File Prior to Visit  Medication Sig Dispense Refill  . albuterol (  PROVENTIL HFA;VENTOLIN HFA) 108 (90 BASE) MCG/ACT inhaler Inhale 2 puffs into the lungs as needed for wheezing or shortness of breath.     Marland Kitchen albuterol (PROVENTIL) (2.5 MG/3ML) 0.083% nebulizer solution Take 3 mLs (2.5 mg total) by nebulization every 6 (six) hours as needed for wheezing or shortness of breath. 75 mL 12  . alendronate (FOSAMAX) 70 MG tablet Take 70 mg by mouth once a week. Patient takes on Saturday    . allopurinol (ZYLOPRIM) 100 MG tablet Take 300 mg by mouth daily.     Marland Kitchen ALPRAZolam (XANAX) 0.5 MG tablet Take 0.5-1 mg by mouth 2 (two) times daily as needed for anxiety or sleep.     Marland Kitchen atenolol  (TENORMIN) 25 MG tablet Take 1 tablet (25 mg total) by mouth daily.    . Biotin 5000 MCG CAPS Take 5,000 mcg by mouth daily.     Marland Kitchen buPROPion (WELLBUTRIN XL) 150 MG 24 hr tablet Take 1 tablet (150 mg total) by mouth daily. 90 tablet 3  . cholecalciferol (VITAMIN D) 1000 UNITS tablet Take 1,000 Units by mouth daily.    Marland Kitchen co-enzyme Q-10 50 MG capsule Take 400 mg by mouth daily.    . colchicine (COLCRYS) 0.6 MG tablet Take 0.6 mg by mouth daily as needed (flare  up).     . doxycycline (VIBRAMYCIN) 100 MG capsule Take 1 capsule (100 mg total) by mouth 2 (two) times daily. 14 capsule 0  . escitalopram (LEXAPRO) 10 MG tablet Take 10 mg by mouth daily.  0  . ezetimibe (ZETIA) 10 MG tablet Take 0.5-1 tablets (5-10 mg total) by mouth daily. 90 tablet 3  . fexofenadine (ALLEGRA) 180 MG tablet Take 180 mg by mouth daily.      . fluticasone (FLONASE) 50 MCG/ACT nasal spray Place 1 spray into both nostrils daily.     . Fluticasone-Salmeterol (ADVAIR) 500-50 MCG/DOSE AEPB Inhale 1 puff into the lungs every 12 (twelve) hours.      . gabapentin (NEURONTIN) 100 MG capsule Take 100 mg by mouth daily.  0  . hydrochlorothiazide 25 MG tablet Take 50 mg by mouth daily.     Marland Kitchen KLOR-CON M20 20 MEQ tablet Take 20 mEq by mouth daily.     Marland Kitchen letrozole (FEMARA) 2.5 MG tablet Take 1 tablet (2.5 mg total) by mouth daily. 90 tablet 2  . levocetirizine (XYZAL) 5 MG tablet Take 5 mg by mouth at bedtime.  0  . levothyroxine (SYNTHROID, LEVOTHROID) 50 MCG tablet Take 50 mcg by mouth daily.      . magic mouthwash w/lidocaine SOLN Take 5 mLs by mouth 3 (three) times daily as needed for mouth pain.    . montelukast (SINGULAIR) 10 MG tablet Take 10 mg by mouth at bedtime.      Marland Kitchen nystatin cream (MYCOSTATIN) Apply 1 application topically daily as needed for dry skin.   0  . rivaroxaban (XARELTO) 20 MG TABS tablet Take 1 tablet (20 mg total) by mouth daily. 90 tablet 3  . rosuvastatin (CRESTOR) 10 MG tablet Take 1 tablet (10 mg total) by  mouth 2 (two) times a week. As of 02/18/2013, taking 2 x weekly 10 tablet 11  . traMADol (ULTRAM) 50 MG tablet Take 50 mg by mouth every 12 (twelve) hours as needed for moderate pain.     . traMADol-acetaminophen (ULTRACET) 37.5-325 MG tablet Take 1-2 tablets by mouth every 6 (six) hours as needed for moderate pain or severe pain.   0  .  vitamin C (ASCORBIC ACID) 500 MG tablet Take 500 mg by mouth daily.     No current facility-administered medications on file prior to visit.     Review of Systems  Constitutional: Negative for unusual diaphoresis or night sweats HENT: Negative for ringing in ear or discharge Eyes: Negative for double vision or worsening visual disturbance.  Respiratory: Negative for choking and stridor.   Gastrointestinal: Negative for vomiting or other signifcant bowel change Genitourinary: Negative for hematuria or change in urine volume.  Musculoskeletal: Negative for other MSK pain or swelling Skin: Negative for color change and worsening wound.  Neurological: Negative for tremors and numbness other than noted  Psychiatric/Behavioral: Negative for decreased concentration or agitation other than above       Objective:   Physical Exam BP 124/80 mmHg  Pulse 60  Temp(Src) 97.8 F (36.6 C) (Oral)  Resp 20  Wt 303 lb (137.44 kg)  SpO2 94% VS noted, mild ill Constitutional: Pt appears in no significant distress HENT: Head: NCAT.  Right Ear: External ear normal.  Left Ear: External ear normal.  Bilat tm's with mild erythema.  Max sinus areas non tender.  Pharynx with mild erythema, no exudate Eyes: . Pupils are equal, round, and reactive to light. Conjunctivae and EOM are normal Neck: Normal range of motion. Neck supple.  Cardiovascular: Normal rate and regular rhythm.   Pulmonary/Chest: Effort normal and breath sounds decreased without rales but with bialt diffuse rhonchi and mild wheezing.  Neurological: Pt is alert. Not confused , motor grossly intact Skin:  Skin is warm. No rash, no LE edema Psychiatric: Pt behavior is normal. No agitation.     Assessment & Plan:

## 2015-07-20 NOTE — Assessment & Plan Note (Signed)
Mild to mod, for depomedrol IM, predpac asd, to f/u any worsening symptoms or concerns 

## 2015-07-20 NOTE — Assessment & Plan Note (Signed)
stable overall by history and exam, recent data reviewed with pt, and pt to continue medical treatment as before,  to f/u any worsening symptoms or concerns BP Readings from Last 3 Encounters:  07/20/15 124/80  06/30/15 130/64  06/12/15 120/84

## 2015-08-21 ENCOUNTER — Other Ambulatory Visit: Payer: Self-pay | Admitting: *Deleted

## 2015-08-21 MED ORDER — ATENOLOL 25 MG PO TABS: 25.0000 mg | ORAL_TABLET | Freq: Every day | ORAL | Status: DC

## 2015-08-30 ENCOUNTER — Encounter: Payer: Self-pay | Admitting: Internal Medicine

## 2015-08-30 ENCOUNTER — Ambulatory Visit (INDEPENDENT_AMBULATORY_CARE_PROVIDER_SITE_OTHER): Payer: Medicare Other | Admitting: Internal Medicine

## 2015-08-30 VITALS — BP 130/82 | HR 68 | Temp 98.2°F | Resp 20 | Wt 303.0 lb

## 2015-08-30 DIAGNOSIS — J4520 Mild intermittent asthma, uncomplicated: Secondary | ICD-10-CM

## 2015-08-30 DIAGNOSIS — J029 Acute pharyngitis, unspecified: Secondary | ICD-10-CM

## 2015-08-30 DIAGNOSIS — I10 Essential (primary) hypertension: Secondary | ICD-10-CM | POA: Diagnosis not present

## 2015-08-30 DIAGNOSIS — T7840XA Allergy, unspecified, initial encounter: Secondary | ICD-10-CM

## 2015-08-30 MED ORDER — ALPRAZOLAM 0.5 MG PO TABS: 0.5000 mg | ORAL_TABLET | Freq: Two times a day (BID) | ORAL | Status: DC | PRN

## 2015-08-30 NOTE — Patient Instructions (Signed)
Please continue all other medications as before, and refills have been done if requested.  Please have the pharmacy call with any other refills you may need.  Please continue your efforts at being more active, low cholesterol diet, and weight control.  You are otherwise up to date with prevention measures today.  Please keep your appointments with your specialists as you may have planned  You will be contacted regarding the referral for: Allergy  Please return in 6 months, or sooner if needed

## 2015-08-30 NOTE — Progress Notes (Signed)
Subjective:    Patient ID: [name], female    DOB: 03/24/1936, 80 y.o.   MRN: [id number]  HPI Here to f/u; overall doing ok,  Pt denies chest pain, increasing sob or doe, wheezing, orthopnea, PND, increased LE swelling, palpitations, dizziness or syncope.  Pt denies new neurological symptoms such as new headache, or facial or extremity weakness or numbness.  Pt denies polydipsia, polyuria, or low sugar episode.   Pt denies new neurological symptoms such as new headache, or facial or extremity weakness or numbness.   Pt states overall good compliance with meds, mostly trying to follow appropriate diet, with wt overall stable Wt Readings from Last 3 Encounters:  08/30/15 303 lb (137.44 kg)  07/20/15 303 lb (137.44 kg)  06/30/15 302 lb (136.986 kg)  Wellbutrin and lexapro have helped greatly, and has really minimal to no s/s depression at this time, Denies worsening depressive symptoms, suicidal ideation, or panic; has ongoing anxiety, but better controlled.  Left parotid area improved with last tx as well.  Does have several wks ongoing nasal allergy symptoms with clearish congestion, itch and sneezing, without fever, pain, ST, cough, swelling or wheezing, asks for allergy referral Past Medical History  Diagnosis Date  . Anxiety   . Allergy   . Asthma   . 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 Incontinence of urine   . Plantar fasciitis   . 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 Gout     post operative  . Atrial fibrillation ([place]) 01/21/11  . Malignant neoplasm of upper-outer quadrant of female breast ([place]) 12/26/2010  . Hx of radiation therapy 04/02/11 -05/20/11    left breast  . COPD (chronic obstructive pulmonary disease) ([place])   . Sleep apnea   . Hypothyroidism 03/24/2015  . Gout 03/24/2015  . Essential hypertension 03/24/2015  . Asthma 03/24/2015  . Peripheral neuropathy ([employer]) 03/24/2015  . Osteoporosis 03/24/2015  . OSA (obstructive sleep apnea)  03/24/2015   Past Surgical History  Procedure Laterality Date  . Tonsillectomy  1955  . Cholecystectomy  1955  . Hand surgery  1992    tendons between thumb and forefinger  . Coronary angioplasty with stent placement  2000  . Skin biopsy      1994, 2006, 2008, 2010, 2011, 2011, 2012-  pre-cancerous  . Dilation and curettage of uterus  1976    and 1996  . Breast lumpectomy w/ needle localization  01/29/2011    Left with [facility] Dr Margot Chimes    reports that she quit smoking about 52 years ago. She has never used smokeless tobacco. She reports that she does not drink alcohol or use illicit drugs. family history includes Asthma in her sister; Cancer in her father; Heart disease in her brother and father. Allergies  Allergen Reactions  . Crestor [Rosuvastatin Calcium]     Severe liver function problems  . Lipitor [Atorvastatin Calcium] Other (See Comments)    Elevated liver function   . Vicodin [Hydrocodone-Acetaminophen] Itching    Itching all over  . Codeine Nausea Only and Anxiety    Also complained of dizziness.  Has same problems with oxycodone and hydrocodone  . Quinine Derivatives Nausea Only    dizzy  . Sulfa Antibiotics Nausea Only    Also complained of dizziness  . Keflex [Cephalexin]   . Epinephrine Other (See Comments)    Patient becomes "shaky"  . Penicillins Rash    Pt  reported all over body rash in 1958 Has patient had a PCN reaction causing immediate rash, facial/tongue/throat swelling, SOB or lightheadedness with hypotension:Yes Has patient had a PCN reaction causing severe rash involving mucus membranes or skin necrosis: No Has patient had a PCN reaction that required hospitalization: No Has patient had a PCN reaction occurring within the last 10 years:No    . Theophyllines Other (See Comments)    Patient becomes "shaky"   Current Outpatient Prescriptions on File Prior to Visit  Medication Sig Dispense Refill  . albuterol (PROVENTIL) (2.5 MG/3ML) 0.083% nebulizer  solution Take 3 mLs (2.5 mg total) by nebulization every 6 (six) hours as needed for wheezing or shortness of breath. 75 mL 12  . allopurinol (ZYLOPRIM) 100 MG tablet Take 300 mg by mouth daily.     Marland Kitchen atenolol (TENORMIN) 25 MG tablet Take 1 tablet (25 mg total) by mouth daily. 90 tablet 0  . Biotin 5000 MCG CAPS Take 5,000 mcg by mouth daily.     Marland Kitchen buPROPion (WELLBUTRIN XL) 150 MG 24 hr tablet Take 1 tablet (150 mg total) by mouth daily. 90 tablet 3  . cholecalciferol (VITAMIN D) 1000 UNITS tablet Take 1,000 Units by mouth daily.    Marland Kitchen co-enzyme Q-10 50 MG capsule Take 400 mg by mouth daily.    . colchicine (COLCRYS) 0.6 MG tablet Take 0.6 mg by mouth daily as needed (flare  up).     . doxycycline (VIBRAMYCIN) 100 MG capsule Take 1 capsule (100 mg total) by mouth 2 (two) times daily. 14 capsule 0  . ezetimibe (ZETIA) 10 MG tablet Take 0.5-1 tablets (5-10 mg total) by mouth daily. 90 tablet 3  . fluticasone (FLONASE) 50 MCG/ACT nasal spray Place 1 spray into both nostrils daily.     Marland Kitchen gabapentin (NEURONTIN) 100 MG capsule Take 100 mg by mouth daily.  0  . letrozole (FEMARA) 2.5 MG tablet Take 1 tablet (2.5 mg total) by mouth daily. 90 tablet 2  . magic mouthwash w/lidocaine SOLN Take 5 mLs by mouth 3 (three) times daily as needed for mouth pain.    Marland Kitchen nystatin cream (MYCOSTATIN) Apply 1 application topically daily as needed for dry skin.   0  . rivaroxaban (XARELTO) 20 MG TABS tablet Take 1 tablet (20 mg total) by mouth daily. 90 tablet 3  . rosuvastatin (CRESTOR) 10 MG tablet Take 1 tablet (10 mg total) by mouth 2 (two) times a week. As of 02/18/2013, taking 2 x weekly 10 tablet 11  . traMADol (ULTRAM) 50 MG tablet Take 50 mg by mouth every 12 (twelve) hours as needed for moderate pain.     . traMADol-acetaminophen (ULTRACET) 37.5-325 MG tablet Take 1-2 tablets by mouth every 6 (six) hours as needed for moderate pain or severe pain.   0  . vitamin C (ASCORBIC ACID) 500 MG tablet Take 500 mg by mouth  daily.     No current facility-administered medications on file prior to visit.   Review of Systems  Constitutional: Negative for unusual diaphoresis or night sweats HENT: Negative for ear swelling or discharge Eyes: Negative for worsening visual haziness  Respiratory: Negative for choking and stridor.   Gastrointestinal: Negative for distension or worsening eructation Genitourinary: Negative for retention or change in urine volume.  Musculoskeletal: Negative for other MSK pain or swelling Skin: Negative for color change and worsening wound Neurological: Negative for tremors and numbness other than noted  Psychiatric/Behavioral: Negative for decreased concentration or agitation other than above  Objective:   Physical Exam BP 130/82 mmHg  Pulse 68  Temp(Src) 98.2 F (36.8 C) (Oral)  Resp 20  Wt 303 lb (137.44 kg)  SpO2 94% VS noted,  Constitutional: Pt appears in no apparent distress HENT: Head: NCAT.  Right Ear: External ear normal.  Left Ear: External ear normal.  Bilat tm's with mild erythema.  Max sinus areas non tender.  Pharynx with mild erythema, no exudate Eyes: . Pupils are equal, round, and reactive to light. Conjunctivae and EOM are normal Neck: Normal range of motion. Neck supple.  Cardiovascular: Normal rate and regular rhythm.   Pulmonary/Chest: Effort normal and breath sounds without rales or wheezing.  Neurological: Pt is alert. Not confused , motor grossly intact Skin: Skin is warm. No rash, no LE edema Psychiatric: Pt behavior is normal. No agitation.     Assessment & Plan:

## 2015-08-30 NOTE — Progress Notes (Signed)
Pre visit review using our clinic review tool, if applicable. No additional management support is needed unless otherwise documented below in the visit note. 

## 2015-08-31 ENCOUNTER — Telehealth: Payer: Self-pay

## 2015-08-31 MED ORDER — ALBUTEROL SULFATE HFA 108 (90 BASE) MCG/ACT IN AERS: 2.0000 | INHALATION_SPRAY | RESPIRATORY_TRACT | Status: DC | PRN

## 2015-08-31 MED ORDER — HYDROCHLOROTHIAZIDE 25 MG PO TABS: 50.0000 mg | ORAL_TABLET | Freq: Every day | ORAL | Status: DC

## 2015-08-31 MED ORDER — FEXOFENADINE HCL 180 MG PO TABS: 180.0000 mg | ORAL_TABLET | Freq: Every day | ORAL | Status: DC

## 2015-08-31 MED ORDER — POTASSIUM CHLORIDE CRYS ER 20 MEQ PO TBCR: 20.0000 meq | EXTENDED_RELEASE_TABLET | Freq: Every day | ORAL | Status: DC

## 2015-08-31 MED ORDER — FLUTICASONE-SALMETEROL 500-50 MCG/DOSE IN AEPB: 1.0000 | INHALATION_SPRAY | Freq: Two times a day (BID) | RESPIRATORY_TRACT | Status: DC

## 2015-08-31 MED ORDER — LEVOCETIRIZINE DIHYDROCHLORIDE 5 MG PO TABS: 5.0000 mg | ORAL_TABLET | Freq: Every day | ORAL | Status: DC

## 2015-08-31 MED ORDER — LEVOTHYROXINE SODIUM 50 MCG PO TABS: 50.0000 ug | ORAL_TABLET | Freq: Every day | ORAL | Status: DC

## 2015-08-31 MED ORDER — ALENDRONATE SODIUM 70 MG PO TABS: 70.0000 mg | ORAL_TABLET | ORAL | Status: DC

## 2015-08-31 MED ORDER — MONTELUKAST SODIUM 10 MG PO TABS: 10.0000 mg | ORAL_TABLET | Freq: Every day | ORAL | Status: DC

## 2015-08-31 MED ORDER — ESCITALOPRAM OXALATE 10 MG PO TABS: 10.0000 mg | ORAL_TABLET | Freq: Every day | ORAL | Status: DC

## 2015-08-31 NOTE — Telephone Encounter (Signed)
Medications sent to pharmacy

## 2015-09-04 DIAGNOSIS — M1711 Unilateral primary osteoarthritis, right knee: Secondary | ICD-10-CM | POA: Diagnosis not present

## 2015-09-04 DIAGNOSIS — M1712 Unilateral primary osteoarthritis, left knee: Secondary | ICD-10-CM | POA: Diagnosis not present

## 2015-09-05 NOTE — Assessment & Plan Note (Signed)
Resolved, pt reassured,  to f/u any worsening symptoms or concerns 

## 2015-09-05 NOTE — Assessment & Plan Note (Signed)
Mild to mod, for cont'd allegra, refer allergy pt pt request, and to f/u any worsening symptoms or concerns

## 2015-09-05 NOTE — Assessment & Plan Note (Signed)
stable overall by history and exam, recent data reviewed with pt, and pt to continue medical treatment as before,  to f/u any worsening symptoms or concerns SpO2 Readings from Last 3 Encounters:  08/30/15 94%  07/20/15 94%  06/30/15 95%   No further steroid tx needed at this time

## 2015-09-05 NOTE — Assessment & Plan Note (Signed)
stable overall by history and exam, recent data reviewed with pt, and pt to continue medical treatment as before,  to f/u any worsening symptoms or concerns BP Readings from Last 3 Encounters:  08/30/15 130/82  07/20/15 124/80  06/30/15 130/64

## 2015-10-10 DIAGNOSIS — D3131 Benign neoplasm of right choroid: Secondary | ICD-10-CM | POA: Diagnosis not present

## 2015-10-10 DIAGNOSIS — C50912 Malignant neoplasm of unspecified site of left female breast: Secondary | ICD-10-CM | POA: Insufficient documentation

## 2015-10-11 ENCOUNTER — Other Ambulatory Visit: Payer: Self-pay | Admitting: *Deleted

## 2015-10-11 DIAGNOSIS — C50412 Malignant neoplasm of upper-outer quadrant of left female breast: Secondary | ICD-10-CM

## 2015-10-12 ENCOUNTER — Other Ambulatory Visit (HOSPITAL_BASED_OUTPATIENT_CLINIC_OR_DEPARTMENT_OTHER): Payer: Medicare Other

## 2015-10-12 ENCOUNTER — Telehealth: Payer: Self-pay | Admitting: Oncology

## 2015-10-12 ENCOUNTER — Ambulatory Visit (HOSPITAL_BASED_OUTPATIENT_CLINIC_OR_DEPARTMENT_OTHER): Payer: Medicare Other | Admitting: Oncology

## 2015-10-12 VITALS — BP 121/69 | HR 51 | Temp 98.4°F | Resp 18 | Ht 65.0 in | Wt 306.7 lb

## 2015-10-12 DIAGNOSIS — Z170 Estrogen receptor positive status [ER+]: Secondary | ICD-10-CM

## 2015-10-12 DIAGNOSIS — C50412 Malignant neoplasm of upper-outer quadrant of left female breast: Secondary | ICD-10-CM | POA: Diagnosis not present

## 2015-10-12 DIAGNOSIS — C50419 Malignant neoplasm of upper-outer quadrant of unspecified female breast: Secondary | ICD-10-CM

## 2015-10-12 LAB — CBC WITH DIFFERENTIAL/PLATELET
BASO%: 0.3 % (ref 0.0–2.0)
BASOS ABS: 0 10*3/uL (ref 0.0–0.1)
EOS%: 3.1 % (ref 0.0–7.0)
Eosinophils Absolute: 0.2 10*3/uL (ref 0.0–0.5)
HEMATOCRIT: 41.2 % (ref 34.8–46.6)
HGB: 13.4 g/dL (ref 11.6–15.9)
LYMPH#: 1.4 10*3/uL (ref 0.9–3.3)
LYMPH%: 22.5 % (ref 14.0–49.7)
MCH: 32.1 pg (ref 25.1–34.0)
MCHC: 32.5 g/dL (ref 31.5–36.0)
MCV: 98.8 fL (ref 79.5–101.0)
MONO#: 0.7 10*3/uL (ref 0.1–0.9)
MONO%: 11.5 % (ref 0.0–14.0)
NEUT#: 3.9 10*3/uL (ref 1.5–6.5)
NEUT%: 62.6 % (ref 38.4–76.8)
Platelets: 137 10*3/uL — ABNORMAL LOW (ref 145–400)
RBC: 4.17 10*6/uL (ref 3.70–5.45)
RDW: 15.6 % — ABNORMAL HIGH (ref 11.2–14.5)
WBC: 6.2 10*3/uL (ref 3.9–10.3)

## 2015-10-12 LAB — COMPREHENSIVE METABOLIC PANEL
ALT: 18 U/L (ref 0–55)
AST: 20 U/L (ref 5–34)
Albumin: 3.6 g/dL (ref 3.5–5.0)
Alkaline Phosphatase: 56 U/L (ref 40–150)
Anion Gap: 8 mEq/L (ref 3–11)
BUN: 22.3 mg/dL (ref 7.0–26.0)
CALCIUM: 9.5 mg/dL (ref 8.4–10.4)
CHLORIDE: 107 meq/L (ref 98–109)
CO2: 26 mEq/L (ref 22–29)
Creatinine: 0.9 mg/dL (ref 0.6–1.1)
EGFR: 65 mL/min/{1.73_m2} — ABNORMAL LOW (ref >90)
Glucose: 87 mg/dl (ref 70–140)
POTASSIUM: 3.6 meq/L (ref 3.5–5.1)
Sodium: 142 mEq/L (ref 136–145)
Total Bilirubin: 0.85 mg/dL (ref 0.20–1.20)
Total Protein: 6.7 g/dL (ref 6.4–8.3)

## 2015-10-12 MED ORDER — LETROZOLE 2.5 MG PO TABS: 2.5000 mg | ORAL_TABLET | Freq: Every day | ORAL | Status: DC

## 2015-10-12 NOTE — Progress Notes (Signed)
ID: [name]   DOB: Feb 09, 1936  MR#: [id number]  CSN#:[id number]  PCP: Mercedes Cower, MD GYN: [name]/[name] OTHER MD: [name], [name], [name], [name] COMPLAINT:  Left Breast Cancer  CURRENT TREATMENT: Letrozole  BREAST CANCER HISTORY: From the original intake note:  [name] had an unremarkable screening mammogram August of 2011.  On 12/18/2010 however a potential abnormality was noted in the left breast and she was recalled for additional views 08/16.  Dr. Marcelo [name] was able to demonstrate a focal irregular density in the upper outer quadrant of the left breast associated with linear microcalcifications.  By ultrasound a 9 mm hypoechoic mass was noted at the corresponding area.  This warranted biopsy and a biopsy was performed under ultrasound guidance 08/21.  The pathology showed ([id number]) an invasive lobular carcinoma, E cadherin negative, associated with ductal carcinoma in situ.  The invasive lobular carcinoma was HER-2 negative with a ratio by CISH of 1.23.  Estrogen receptor was 100% positive, progesterone receptor was 100% positive and the proliferation marker was 14%.  With this information the patient was referred to [name] and bilateral breast MRIs were obtained 12/28/2010.  In the upper outer quadrant of the left breast there was a small irregular nodule measuring 1.2 cm maximally.  There were no other suspicious abnormalities in the left breast or in the right breast.  There were no suspicious internal mammary or axillary lymph nodes identified.  With this information the patient is referred for further evaluation and treatment.  Further treatment is as detailed below.  INTERVAL HISTORY: [name] returns today for follow up of her estrogen receptor positive breast cancer, accompanied by her husband [name]. She continues on letrozole, with good tolerance. Hot flashes and vaginal dryness are not a major issue. She obtains it at a good  price.  Since her last visit here she has established herself with Dr. Jenny [name] and [name] 7. She also saw Dr. Constance [name] for her parotid gland problem, which had largely resolved by the time she got to him.   REVIEW OF SYSTEMS [name] has significant knee pain, for which she receives steroid injections. This helps a little. At home she uses a walker, outside she uses a wheelchair. For the knee pain mostly she takes Tylenol, 2 or 3 times a week she might take tramadol at bedtime. She does have some nausea at times. She finds that gabapentin helps. Her breathing is better on her current regimen. She is on Xarelto and bruises easily but has had no overt bleeding. A detailed review of systems today was otherwise stable  PAST MEDICAL HISTORY: Past Medical History  Diagnosis Date  . Anxiety   . Allergy   . Asthma   . 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 Incontinence of urine   . Plantar fasciitis   . Breast cancer ([name]) Receptor + her2 _ 12/26/2010    left  . Gout     post operative  . Atrial fibrillation ([name]) 01/21/11  . Malignant neoplasm of upper-outer quadrant of female breast ([name]) 12/26/2010  . Hx of radiation therapy 04/02/11 -05/20/11    left breast  . COPD (chronic obstructive pulmonary disease) ([name])   . Sleep apnea   . Hypothyroidism 03/24/2015  . Gout 03/24/2015  . Essential hypertension 03/24/2015  . Asthma 03/24/2015  . Peripheral neuropathy ([name]) 03/24/2015  . Osteoporosis 03/24/2015  . OSA (obstructive sleep apnea) 03/24/2015  The past medical history is  significant for hypertension, asthma, hypothyroidism, osteoarthritis/degenerative disk disease with particular problems with her right knee, history of depression, history of paresthesias, hypercholesterolemia, allergic rhinitis, osteopenia, status post tonsillectomy, status post cholecystectomy, status post right hand surgery, history of fractures to the arms and legs, history of coronary artery disease status post stenting of her right  coronary with a good cath report in 2001.  She is status post D and C and cryosurgery.  PAST SURGICAL HISTORY: Past Surgical History  Procedure Laterality Date  . Tonsillectomy  1955  . Cholecystectomy  1955  . Hand surgery  1992    tendons between thumb and forefinger  . Coronary angioplasty with stent placement  2000  . Skin biopsy      1994, 2006, 2008, 2010, 2011, 2011, 2012-  pre-cancerous  . Dilation and curettage of uterus  1976    and 1996  . Breast lumpectomy w/ needle localization  01/29/2011    Left with [facility] Dr Margot [name]    FAMILY HISTORY Family History  Problem Relation Age of Onset  . Heart disease Father   . Cancer Father     intestinal polyps  . Asthma Sister   . Heart disease Brother   The patient's father had surgery for cancerous polyps in the colon at age 57.  He died at age 52 from a heart attack.  The patient's mother died at age 40.  The patient has 1 brother alive at age 36 and in good health.  She has 1 sister also in good health.  There is no family history of breast or ovarian cancer.  GYNECOLOGIC HISTORY: She had menarche at age 56, last period in 68.  She took hormone replacement for less than a year, stopping in 1996.  She carried 3 children to term, the first one at age 35.  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  She is also an active Nurse, adult.  She is now retired.  Her husband, [name], is present today as are her daughters, [name] and [name].  [name] is 40, lives here in [place] and is a wound treatment specialist.  [name] also lives in [place] and is an Optometrist.  Son, [name], died at the age of 52 from what seems to have been a heart attack.  The patient has 2 grandchildren.   ADVANCED DIRECTIVES: In place  HEALTH MAINTENANCE: (Updated 02/18/2013) Social History  Substance Use Topics  . Smoking status: Former Smoker -- 1.00 packs/day     Quit date: 12/05/1962  . Smokeless tobacco: Never Used  . Alcohol Use: No     Colonoscopy:  PAP:  Bone density: Not on file  Lipid panel: Sept 2014, Dr. Irish Lack    Allergies  Allergen Reactions  . Crestor [Rosuvastatin Calcium]     Severe liver function problems  . Lipitor [Atorvastatin Calcium] Other (See Comments)    Elevated liver function   . Vicodin [Hydrocodone-Acetaminophen] Itching    Itching all over  . Codeine Nausea Only and Anxiety    Also complained of dizziness.  Has same problems with oxycodone and hydrocodone  . Quinine Derivatives Nausea Only    dizzy  . Sulfa Antibiotics Nausea Only    Also complained of dizziness  . Keflex [Cephalexin]   . Epinephrine Other (See Comments)    Patient becomes "shaky"  . Penicillins Rash    Pt reported all over body rash in 1958  Has patient had a PCN reaction causing immediate rash, facial/tongue/throat swelling, SOB or lightheadedness with hypotension:Yes Has patient had a PCN reaction causing severe rash involving mucus membranes or skin necrosis: No Has patient had a PCN reaction that required hospitalization: No Has patient had a PCN reaction occurring within the last 10 years:No    . Theophyllines Other (See Comments)    Patient becomes "shaky"    Current Outpatient Prescriptions  Medication Sig Dispense Refill  . albuterol (PROVENTIL HFA;VENTOLIN HFA) 108 (90 Base) MCG/ACT inhaler Inhale 2 puffs into the lungs as needed for wheezing or shortness of breath. 18 g 3  . albuterol (PROVENTIL) (2.5 MG/3ML) 0.083% nebulizer solution Take 3 mLs (2.5 mg total) by nebulization every 6 (six) hours as needed for wheezing or shortness of breath. 75 mL 12  . alendronate (FOSAMAX) 70 MG tablet Take 1 tablet (70 mg total) by mouth once a week. Patient takes on Saturday 4 tablet 3  . allopurinol (ZYLOPRIM) 100 MG tablet Take 300 mg by mouth daily.     Marland Kitchen ALPRAZolam (XANAX) 0.5 MG tablet Take 1-2 tablets (0.5-1 mg total) by  mouth 2 (two) times daily as needed for anxiety or sleep. 60 tablet 3  . atenolol (TENORMIN) 25 MG tablet Take 1 tablet (25 mg total) by mouth daily. 90 tablet 0  . Biotin 5000 MCG CAPS Take 5,000 mcg by mouth daily.     Marland Kitchen buPROPion (WELLBUTRIN XL) 150 MG 24 hr tablet Take 1 tablet (150 mg total) by mouth daily. 90 tablet 3  . cholecalciferol (VITAMIN D) 1000 UNITS tablet Take 1,000 Units by mouth daily.    Marland Kitchen co-enzyme Q-10 50 MG capsule Take 400 mg by mouth daily.    . colchicine (COLCRYS) 0.6 MG tablet Take 0.6 mg by mouth daily as needed (flare  up).     . doxycycline (VIBRAMYCIN) 100 MG capsule Take 1 capsule (100 mg total) by mouth 2 (two) times daily. 14 capsule 0  . escitalopram (LEXAPRO) 10 MG tablet Take 1 tablet (10 mg total) by mouth daily. 30 tablet 3  . ezetimibe (ZETIA) 10 MG tablet Take 0.5-1 tablets (5-10 mg total) by mouth daily. 90 tablet 3  . fexofenadine (ALLEGRA) 180 MG tablet Take 1 tablet (180 mg total) by mouth daily. 30 tablet 3  . fluticasone (FLONASE) 50 MCG/ACT nasal spray Place 1 spray into both nostrils daily.     . Fluticasone-Salmeterol (ADVAIR) 500-50 MCG/DOSE AEPB Inhale 1 puff into the lungs every 12 (twelve) hours. 60 each 3  . gabapentin (NEURONTIN) 100 MG capsule Take 100 mg by mouth daily.  0  . hydrochlorothiazide (HYDRODIURIL) 25 MG tablet Take 2 tablets (50 mg total) by mouth daily. 60 tablet 3  . letrozole (FEMARA) 2.5 MG tablet Take 1 tablet (2.5 mg total) by mouth daily. 90 tablet 2  . levocetirizine (XYZAL) 5 MG tablet Take 1 tablet (5 mg total) by mouth at bedtime. 30 tablet 3  . levothyroxine (SYNTHROID, LEVOTHROID) 50 MCG tablet Take 1 tablet (50 mcg total) by mouth daily. 30 tablet 3  . magic mouthwash w/lidocaine SOLN Take 5 mLs by mouth 3 (three) times daily as needed for mouth pain.    . montelukast (SINGULAIR) 10 MG tablet Take 1 tablet (10 mg total) by mouth at bedtime. 30 tablet 3  . nystatin cream (MYCOSTATIN) Apply 1 application topically  daily as needed for dry skin.   0  . potassium chloride SA (KLOR-CON M20) 20 MEQ tablet Take 1  tablet (20 mEq total) by mouth daily. 30 tablet 3  . rivaroxaban (XARELTO) 20 MG TABS tablet Take 1 tablet (20 mg total) by mouth daily. 90 tablet 3  . rosuvastatin (CRESTOR) 10 MG tablet Take 1 tablet (10 mg total) by mouth 2 (two) times a week. As of 02/18/2013, taking 2 x weekly 10 tablet 11  . traMADol (ULTRAM) 50 MG tablet Take 50 mg by mouth every 12 (twelve) hours as needed for moderate pain.     . traMADol-acetaminophen (ULTRACET) 37.5-325 MG tablet Take 1-2 tablets by mouth every 6 (six) hours as needed for moderate pain or severe pain.   0  . vitamin C (ASCORBIC ACID) 500 MG tablet Take 500 mg by mouth daily.     No current facility-administered medications for this visit.    OBJECTIVE: Elderly white woman examined in a wheelchair Filed Vitals:   10/12/15 1326  BP: 121/69  Pulse: 51  Temp: 98.4 F (36.9 C)  Resp: 18     Body mass index is 51.04 kg/(m^2).    ECOG FS: 2 Filed Weights   10/12/15 1326  Weight: 306 lb 11.2 oz (139.118 kg)   Sclerae unicteric, pupils round and equal Oropharynx clear and moist-- no thrush or other lesions No cervical or supraclavicular adenopathy, no neck masses noted Lungs no rales or rhonchi Heart regular rate and rhythm Abd soft, obese, nontender, positive bowel sounds MSK no focal spinal tenderness, no upper extremity lymphedema Neuro: nonfocal, well oriented, appropriate affect Breasts: The right breast is unremarkable. The left breast is status post lumpectomy and radiation. There is no evidence of local recurrence. Left axilla is benign.     LAB RESULTS: Lab Results  Component Value Date   WBC 6.2 10/12/2015   NEUTROABS 3.9 10/12/2015   HGB 13.4 10/12/2015   HCT 41.2 10/12/2015   MCV 98.8 10/12/2015   PLT 137* 10/12/2015      Chemistry      Component Value Date/Time   NA 142 06/30/2015 1134   NA 145 03/14/2015 1352   K 3.7  06/30/2015 1134   K 3.9 03/14/2015 1352   CL 105 06/30/2015 1134   CL 105 07/15/2012 1409   CO2 31 06/30/2015 1134   CO2 28 03/14/2015 1352   BUN 19 06/30/2015 1134   BUN 14.4 03/14/2015 1352   CREATININE 0.80 06/30/2015 1134   CREATININE 0.8 03/14/2015 1352      Component Value Date/Time   CALCIUM 9.6 06/30/2015 1134   CALCIUM 9.8 03/14/2015 1352   ALKPHOS 64 06/30/2015 1134   ALKPHOS 78 03/14/2015 1352   AST 22 06/30/2015 1134   AST 24 03/14/2015 1352   ALT 17 06/30/2015 1134   ALT 26 03/14/2015 1352   BILITOT 1.5* 06/30/2015 1134   BILITOT 1.74* 03/14/2015 1352       Lab Results  Component Value Date   LABCA2 17 07/15/2012   STUDIES: No results found.  ASSESSMENT: 80 y.o.  [place] woman   (1)  status post left lumpectomy and sentinel lymph node sampling 01/29/2011 for a T1BN0 (stage I) invasive lobular breast cancer, grade 2, estrogen receptor 100% positive, progesterone receptor 100% positive, with no HER-2 amplification, and then MIB-1 of 14%;   (2)  s/p radiation completed January 2013,   (3)  on letrozole as of February 2013, the plan being to continue for total of 5 years (to February 2018)  (4)  mild hypokalemia   (5)  possible lymphadenopathy in the left posterior cervical  chain--no significant finding on [place] October 2014  (6) morbid obesity  (7) sleep apnea   PLAN:  [name] Is now just about 5 years out from definitive surgery for her breast cancer with no evidence of disease recurrence. This is very favorable.  The plan is to continue letrozole for 5 years which will and February of next year. I will see her at that point she will be ready to "graduate" from breast cancer follow-up then.  Currently she is behind on her mammography. This will be done next week at [facility]  She knows to call for any problems that may develop before her next visit here.  Chauncey Cruel, MD   10/12/2015

## 2015-10-12 NOTE — Telephone Encounter (Signed)
Gave and printed appt sched and avs for pt for [name] 6.15 @ 12:45 .Marland Kitchen..and Feb 2018

## 2015-10-19 DIAGNOSIS — Z853 Personal history of malignant neoplasm of breast: Secondary | ICD-10-CM | POA: Diagnosis not present

## 2015-11-02 ENCOUNTER — Other Ambulatory Visit: Payer: Self-pay | Admitting: Interventional Cardiology

## 2015-11-11 ENCOUNTER — Other Ambulatory Visit: Payer: Self-pay | Admitting: Interventional Cardiology

## 2015-11-21 ENCOUNTER — Encounter: Payer: Self-pay | Admitting: Interventional Cardiology

## 2015-11-21 ENCOUNTER — Ambulatory Visit (INDEPENDENT_AMBULATORY_CARE_PROVIDER_SITE_OTHER): Payer: Medicare Other | Admitting: Interventional Cardiology

## 2015-11-21 ENCOUNTER — Encounter (INDEPENDENT_AMBULATORY_CARE_PROVIDER_SITE_OTHER): Payer: Self-pay

## 2015-11-21 VITALS — BP 130/70 | HR 56 | Ht 65.0 in | Wt 309.0 lb

## 2015-11-21 DIAGNOSIS — I779 Disorder of arteries and arterioles, unspecified: Secondary | ICD-10-CM

## 2015-11-21 DIAGNOSIS — I482 Chronic atrial fibrillation, unspecified: Secondary | ICD-10-CM

## 2015-11-21 DIAGNOSIS — I2510 Atherosclerotic heart disease of native coronary artery without angina pectoris: Secondary | ICD-10-CM | POA: Diagnosis not present

## 2015-11-21 DIAGNOSIS — E785 Hyperlipidemia, unspecified: Secondary | ICD-10-CM | POA: Diagnosis not present

## 2015-11-21 DIAGNOSIS — E669 Obesity, unspecified: Secondary | ICD-10-CM

## 2015-11-21 DIAGNOSIS — I739 Peripheral vascular disease, unspecified: Secondary | ICD-10-CM

## 2015-11-21 LAB — HEPATIC FUNCTION PANEL
ALK PHOS: 59 U/L (ref 33–130)
ALT: 16 U/L (ref 6–29)
AST: 20 U/L (ref 10–35)
Albumin: 3.8 g/dL (ref 3.6–5.1)
BILIRUBIN DIRECT: 0.3 mg/dL — AB (ref <=0.2)
BILIRUBIN INDIRECT: 1.2 mg/dL (ref 0.2–1.2)
Total Bilirubin: 1.5 mg/dL — ABNORMAL HIGH (ref 0.2–1.2)
Total Protein: 6.2 g/dL (ref 6.1–8.1)

## 2015-11-21 LAB — LIPID PANEL
CHOLESTEROL: 193 mg/dL (ref 125–200)
HDL: 62 mg/dL (ref >=46)
LDL CALC: 110 mg/dL (ref <130)
Total CHOL/HDL Ratio: 3.1 Ratio (ref <=5.0)
Triglycerides: 105 mg/dL (ref <150)
VLDL: 21 mg/dL (ref <30)

## 2015-11-21 NOTE — Patient Instructions (Addendum)
**Note De-Identified Bana Borgmeyer Obfuscation** Medication Instructions:  Same-no changes  Labwork: Lipids/LFTS  Testing/Procedures: None  Follow-Up: Your physician wants you to follow-up in: 1 year. You will receive a reminder letter in the mail two months in advance. If you don't receive a letter, please call our office to schedule the follow-up appointment.   Any Other Special Instructions Will Be Listed Below (If Applicable). Dr Irish Lack is referring you to our [facility] for possible PCSK9 therapy.     If you need a refill on your cardiac medications before your next appointment, please call your pharmacy.

## 2015-11-21 NOTE — Progress Notes (Signed)
Patient ID: [name], female   DOB: 1935-06-27, 80 y.o.   MRN: [id number]     Cardiology Office Note   Date:  11/21/2015   ID:  [name], DOB August 23, 1935, MRN [id number]  PCP:  Cathlean Cower, MD    No chief complaint on file. f/u AFib   Wt Readings from Last 3 Encounters:  11/21/15 309 lb (140.161 kg)  10/12/15 306 lb 11.2 oz (139.118 kg)  08/30/15 303 lb (137.44 kg)       History of Present Illness: [name] is a 80 y.o. female   who had AFib and CAD. She has had Breast cancer in the recent past. Atrial Fibrillation F/U:  Denies : Chest pain.  Dizziness.   Orthopnea.  Palpitations.  Syncope  No sx lke what she had before stent.  She had an RCA stent placed in 2000. I personally reviewed her cath report from January 2001. At that time, she had repeat angiography which showed mild to moderate coronary disease in her left system. She had mild in-stent restenosis of the RCA stent.  She was told to decrease her atenolol last year. No increased tachycardia. She will watch for sx of tachycardia.  Energy level increased a littte.  Deconditioned due to knee problems.  She was tolerating Crestor. Zetia was added in 2016 for additional lipid lowering control.  Uses wheelchair outside of the house.  At home she uses a walker.    Past Medical History  Diagnosis Date  . Anxiety   . Allergy   . Asthma   . 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 Incontinence of urine   . Plantar fasciitis   . 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 Gout     post operative  . Atrial fibrillation ([place]) 01/21/11  . Malignant neoplasm of upper-outer quadrant of female breast ([place]) 12/26/2010  . Hx of radiation therapy 04/02/11 -05/20/11    left breast  . COPD (chronic obstructive pulmonary disease) ([place])   . Sleep apnea   . Hypothyroidism 03/24/2015  . Gout 03/24/2015  . Essential hypertension 03/24/2015  . Asthma 03/24/2015  . Peripheral neuropathy ([place]) 03/24/2015  .  Osteoporosis 03/24/2015  . OSA (obstructive sleep apnea) 03/24/2015    Past Surgical History  Procedure Laterality Date  . Tonsillectomy  1955  . Cholecystectomy  1955  . Hand surgery  1992    tendons between thumb and forefinger  . Coronary angioplasty with stent placement  2000  . Skin biopsy      1994, 2006, 2008, 2010, 2011, 2011, 2012-  pre-cancerous  . Dilation and curettage of uterus  1976    and 1996  . Breast lumpectomy w/ needle localization  01/29/2011    Left with [facility] Dr Margot Chimes     Current Outpatient Prescriptions  Medication Sig Dispense Refill  . albuterol (PROVENTIL HFA;VENTOLIN HFA) 108 (90 Base) MCG/ACT inhaler Inhale 2 puffs into the lungs as needed for wheezing or shortness of breath. 18 g 3  . albuterol (PROVENTIL) (2.5 MG/3ML) 0.083% nebulizer solution Take 3 mLs (2.5 mg total) by nebulization every 6 (six) hours as needed for wheezing or shortness of breath. 75 mL 12  . alendronate (FOSAMAX) 70 MG tablet Take 1 tablet (70 mg total) by mouth once a week. Patient takes on Saturday 4 tablet 3  . allopurinol (ZYLOPRIM) 100 MG tablet Take 300 mg by mouth daily.     Marland Kitchen  ALPRAZolam (XANAX) 0.5 MG tablet Take 1-2 tablets (0.5-1 mg total) by mouth 2 (two) times daily as needed for anxiety or sleep. 60 tablet 3  . atenolol (TENORMIN) 25 MG tablet Take 1 tablet (25 mg total) by mouth daily. 90 tablet 0  . Biotin 5000 MCG CAPS Take 5,000 mcg by mouth daily.     Marland Kitchen buPROPion (WELLBUTRIN XL) 150 MG 24 hr tablet Take 1 tablet (150 mg total) by mouth daily. 90 tablet 3  . cholecalciferol (VITAMIN D) 1000 UNITS tablet Take 1,000 Units by mouth daily.    Marland Kitchen co-enzyme Q-10 50 MG capsule Take 400 mg by mouth daily.    . colchicine (COLCRYS) 0.6 MG tablet Take 0.6 mg by mouth daily as needed (flare  up).     . CRESTOR 10 MG tablet take 1 tablet by mouth TWO TIMES A WEEK.AS OF10/16/2014, TAKING 2 TIMES WEEKLY 10 tablet 1  . escitalopram (LEXAPRO) 10 MG tablet Take 1 tablet (10 mg total)  by mouth daily. 30 tablet 3  . fexofenadine (ALLEGRA) 180 MG tablet Take 1 tablet (180 mg total) by mouth daily. 30 tablet 3  . fluticasone (FLONASE) 50 MCG/ACT nasal spray Place 1 spray into both nostrils daily.     . Fluticasone-Salmeterol (ADVAIR) 500-50 MCG/DOSE AEPB Inhale 1 puff into the lungs every 12 (twelve) hours. 60 each 3  . gabapentin (NEURONTIN) 100 MG capsule Take 100 mg by mouth daily.  0  . hydrochlorothiazide (HYDRODIURIL) 25 MG tablet Take 2 tablets (50 mg total) by mouth daily. 60 tablet 3  . letrozole (FEMARA) 2.5 MG tablet Take 1 tablet (2.5 mg total) by mouth daily. 90 tablet 4  . levocetirizine (XYZAL) 5 MG tablet Take 1 tablet (5 mg total) by mouth at bedtime. 30 tablet 3  . levothyroxine (SYNTHROID, LEVOTHROID) 50 MCG tablet Take 1 tablet (50 mcg total) by mouth daily. 30 tablet 3  . montelukast (SINGULAIR) 10 MG tablet Take 1 tablet (10 mg total) by mouth at bedtime. 30 tablet 3  . nystatin cream (MYCOSTATIN) Apply 1 application topically daily as needed for dry skin.   0  . potassium chloride SA (KLOR-CON M20) 20 MEQ tablet Take 1 tablet (20 mEq total) by mouth daily. 30 tablet 3  . traMADol (ULTRAM) 50 MG tablet Take 50 mg by mouth every 12 (twelve) hours as needed for moderate pain.     . traMADol-acetaminophen (ULTRACET) 37.5-325 MG tablet Take 1-2 tablets by mouth every 6 (six) hours as needed for moderate pain or severe pain.   0  . vitamin C (ASCORBIC ACID) 500 MG tablet Take 500 mg by mouth daily.    Alveda Reasons 20 MG TABS tablet take 1 tablet by mouth once daily 90 tablet 0  . ZETIA 10 MG tablet take 1/2 to 1 tablet by mouth daily 90 tablet 0   No current facility-administered medications for this visit.    Allergies:   Crestor; Lipitor; Vicodin; Codeine; Oxycodone-acetaminophen; Quinine derivatives; Sulfa antibiotics; Keflex; Epinephrine; Penicillins; and Theophyllines    Social History:  The patient  reports that she quit smoking about 52 years ago. She has  never used smokeless tobacco. She reports that she does not drink alcohol or use illicit drugs.   Family History:  The patient's *family history includes Asthma in her sister; Cancer in her father; Heart attack in her brother and father; Heart disease in her brother and father.    ROS:  Please see the history of present illness.  Otherwise, review of systems are positive for knee pain, palpitations.   All other systems are reviewed and negative.    PHYSICAL EXAM: VS:  BP 130/70 mmHg  Pulse 56  Ht '5\' 5"'$  (1.651 m)  Wt 309 lb (140.161 kg)  BMI 51.42 kg/m2 , BMI Body mass index is 51.42 kg/(m^2). GEN: Well nourished, well developed, in no acute distress HEENT: normal Neck: no JVD, carotid bruits, or masses Cardiac: Bradycardic, irregular; no murmurs, rubs, or gallops,; tr edema  Respiratory:  clear to auscultation bilaterally, normal work of breathing GI: soft, nontender, nondistended, + BS MS: no deformity or atrophy Skin: warm and dry, no rash, skin changes in LE c/w venous insufficiency; tr edema bilaterally Neuro:  Strength and sensation are intact Psych: euthymic mood, full affect   EKG:   The ekg ordered today demonstrates atrial fibrillation with slow ventricular response   Recent Labs: 06/30/2015: TSH 2.17 10/12/2015: ALT 18; BUN 22.3; Creatinine 0.9; HGB 13.4; Platelets 137*; Potassium 3.6; Sodium 142   Lipid Panel    Component Value Date/Time   CHOL 209* 06/30/2015 1134   TRIG 104.0 06/30/2015 1134   HDL 64.20 06/30/2015 1134   CHOLHDL 3 06/30/2015 1134   VLDL 20.8 06/30/2015 1134   LDLCALC 124* 06/30/2015 1134     Other studies Reviewed: Additional studies/ records that were reviewed today with results demonstrating: cath from 1/01.   ASSESSMENT AND PLAN:  A-fib  Continue Atenolol Tablet, 25 MG, take 1 tablet by mouth once daily.  Continue Xarelto 20 mg tablet, 20 mg, 1 tablet, orally, once a day in the evening with food Notes: Rate controlled. Xarelto for  stroke prevention. No bleeding problems.    2. Coronary atherosclerosis of native coronary artery       Notes: No angina. Prior RCA stent placed in 2000.  Had repeat cath as noted above.. Continue aggressive secondary prevention. She'll also try to lose weight.    3. Combined hyperlipidemia  Continue Crestor Tablet, 10 MG, 1 tablet, Orally, twice a week, 30 day(s), 30 Decrease Vitamin D (Ergocalciferol) Capsule, 2000 UNIT, 1 capsule, Orally, Once a day  Notes: LDL over 200 in the past; now 141 in 4/15. She has been intolerant of statins.Would titrate Crestor 10 mg twice a week.could also try coenzyme Q10. Vitamin D level was at 33 in 2015. She will take Vit D 1000U daily. Following in the lipidclinic.  SHe is interested in PCSK9 inhibitor.  Will have Gay Filler look at her most recent lipids.   4. Others  Notes: needs knee replacement. Needs to get to 260 lbs.  Needs to lose 50 lbs from this point/.  Obesity: She has lost 60 lbs in the past few years. Gained a little back but is trying to stick to her diet.   Carotid disease: noted in 2016.  50% internal carotid stenosis.  Noted by CTA. Preventive Medicine  Adult topics discussed:  Diet: healthy diet, low calorie, low fat.  Exercise: 5 days a week, at least 30 minutes of aerobic exercise. She uses a walker at home.             Current medicines are reviewed at length with the patient today.  The patient concerns regarding her medicines were addressed.  The following changes have been made:  Decrease atenolol  Labs/ tests ordered today include: lipids  Orders Placed This Encounter  Procedures  . EKG 12-Lead    Recommend 150 minutes/week of aerobic exercise Low fat, low carb, high fiber  diet recommended  Disposition:   FU in 1 year   Signed, Larae Grooms, MD  11/21/2015 10:07 AM    [facility] [address], [place], [place]  [address] Phone: [phone]; Fax: [phone]

## 2015-11-26 ENCOUNTER — Other Ambulatory Visit: Payer: Self-pay | Admitting: Interventional Cardiology

## 2015-11-28 ENCOUNTER — Ambulatory Visit (INDEPENDENT_AMBULATORY_CARE_PROVIDER_SITE_OTHER): Payer: Medicare Other | Admitting: Pharmacist

## 2015-11-28 DIAGNOSIS — E785 Hyperlipidemia, unspecified: Secondary | ICD-10-CM

## 2015-11-28 NOTE — Progress Notes (Signed)
Patient ID: [name]                 DOB: 1935-11-16                    MRN: [id number]     HPI: [name] is a 80 y.o. female patient referred to lipid clinic by Dr. Irish Lack.  She has a PMH significant for afib, CAD s/p PCI to RCA in 2000, and history of breast cancer. Her hyperlipidemia has been managed with Crestor '10mg'$  twice a week and Zetia '10mg'$  daily.  She has been intolerant to all other statins due to myalgias and elevated LFTs.  LFTs have been normal with Crestor twice weekly.  Her baseline LDL is > 200 mg/dL.  She is here today to discuss PCSK-9 inhibitor therapy given LDL still above goal on max tolerated statin and Zetia.   Current Medications: Crestor '10mg'$  twice weekly and Zetia '10mg'$  daily Intolerances: Lipitor- myalgias and elevated LFTs, daily Crestor- myalgias Risk Factors: CAD s/p PCI, age LDL goal: '70mg'$ /dL  Diet: Pt continues with her low carbohydrate, low fat diet she started about 1 1/2 years ago  Exercise: Limited due to mobility issues  Family History: father had MI at 65 yo and brother had an MI at 37 yo  Social History: Does not drink alcohol and former smoker  Labs: 11/2015- TC 193, TG 105, HDL 62, LDL 110, LFTs normal  Past Medical History:  Diagnosis Date  . Allergy   . Anxiety   . Arthritis    no cartlidge in knees  . Asthma   . Asthma 03/24/2015  . Atrial fibrillation ([place]) 01/21/11  . 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 COPD (chronic obstructive pulmonary disease) ([place])   . Essential hypertension 03/24/2015  . Gout    post operative  . Gout 03/24/2015  . Hx of radiation therapy 04/02/11 -05/20/11   left breast  . Hypothyroidism 03/24/2015  . Incontinence of urine   . Malignant neoplasm of upper-outer quadrant of female breast ([place]) 12/26/2010  . OSA (obstructive sleep apnea) 03/24/2015  . Osteoporosis 03/24/2015  . Peripheral neuropathy ([place]) 03/24/2015  . Plantar fasciitis   . Sleep apnea     Current Outpatient  Prescriptions on File Prior to Visit  Medication Sig Dispense Refill  . albuterol (PROVENTIL HFA;VENTOLIN HFA) 108 (90 Base) MCG/ACT inhaler Inhale 2 puffs into the lungs as needed for wheezing or shortness of breath. 18 g 3  . albuterol (PROVENTIL) (2.5 MG/3ML) 0.083% nebulizer solution Take 3 mLs (2.5 mg total) by nebulization every 6 (six) hours as needed for wheezing or shortness of breath. 75 mL 12  . alendronate (FOSAMAX) 70 MG tablet Take 1 tablet (70 mg total) by mouth once a week. Patient takes on Saturday 4 tablet 3  . allopurinol (ZYLOPRIM) 100 MG tablet Take 300 mg by mouth daily.     Marland Kitchen ALPRAZolam (XANAX) 0.5 MG tablet Take 1-2 tablets (0.5-1 mg total) by mouth 2 (two) times daily as needed for anxiety or sleep. 60 tablet 3  . atenolol (TENORMIN) 25 MG tablet Take 1 tablet (25 mg total) by mouth daily. 90 tablet 0  . Biotin 5000 MCG CAPS Take 5,000 mcg by mouth daily.     Marland Kitchen buPROPion (WELLBUTRIN XL) 150 MG 24 hr tablet Take 1 tablet (150 mg total) by mouth daily. 90 tablet 3  . cholecalciferol (VITAMIN D) 1000 UNITS tablet Take 1,000 Units  by mouth daily.    Marland Kitchen co-enzyme Q-10 50 MG capsule Take 400 mg by mouth daily.    . colchicine (COLCRYS) 0.6 MG tablet Take 0.6 mg by mouth daily as needed (flare  up).     . CRESTOR 10 MG tablet take 1 tablet by mouth TWO TIMES A WEEK.AS OF10/16/2014, TAKING 2 TIMES WEEKLY 10 tablet 1  . escitalopram (LEXAPRO) 10 MG tablet Take 1 tablet (10 mg total) by mouth daily. 30 tablet 3  . fexofenadine (ALLEGRA) 180 MG tablet Take 1 tablet (180 mg total) by mouth daily. 30 tablet 3  . fluticasone (FLONASE) 50 MCG/ACT nasal spray Place 1 spray into both nostrils daily.     . Fluticasone-Salmeterol (ADVAIR) 500-50 MCG/DOSE AEPB Inhale 1 puff into the lungs every 12 (twelve) hours. 60 each 3  . gabapentin (NEURONTIN) 100 MG capsule Take 100 mg by mouth daily.  0  . hydrochlorothiazide (HYDRODIURIL) 25 MG tablet Take 2 tablets (50 mg total) by mouth daily. 60  tablet 3  . letrozole (FEMARA) 2.5 MG tablet Take 1 tablet (2.5 mg total) by mouth daily. 90 tablet 4  . levocetirizine (XYZAL) 5 MG tablet Take 1 tablet (5 mg total) by mouth at bedtime. 30 tablet 3  . levothyroxine (SYNTHROID, LEVOTHROID) 50 MCG tablet Take 1 tablet (50 mcg total) by mouth daily. 30 tablet 3  . montelukast (SINGULAIR) 10 MG tablet Take 1 tablet (10 mg total) by mouth at bedtime. 30 tablet 3  . nystatin cream (MYCOSTATIN) Apply 1 application topically daily as needed for dry skin.   0  . potassium chloride SA (KLOR-CON M20) 20 MEQ tablet Take 1 tablet (20 mEq total) by mouth daily. 30 tablet 3  . traMADol (ULTRAM) 50 MG tablet Take 50 mg by mouth every 12 (twelve) hours as needed for moderate pain.     . traMADol-acetaminophen (ULTRACET) 37.5-325 MG tablet Take 1-2 tablets by mouth every 6 (six) hours as needed for moderate pain or severe pain.   0  . vitamin C (ASCORBIC ACID) 500 MG tablet Take 500 mg by mouth daily.    Alveda Reasons 20 MG TABS tablet take 1 tablet by mouth once daily 90 tablet 0  . ZETIA 10 MG tablet take 1/2 to 1 tablet by mouth daily 90 tablet 0   No current facility-administered medications on file prior to visit.     Allergies  Allergen Reactions  . Crestor [Rosuvastatin Calcium]     Severe liver function problems  . Lipitor [Atorvastatin Calcium] Other (See Comments)    Elevated liver function   . Vicodin [Hydrocodone-Acetaminophen] Itching    Itching all over  . Codeine Nausea Only and Anxiety    Also complained of dizziness.  Has same problems with oxycodone and hydrocodone  . Oxycodone-Acetaminophen Nausea Only, Anxiety and Other (See Comments)    Visual Disturbance, Balance Difficulty, Dizzy "Room Spinning; "Swimming"  . Quinine Derivatives Nausea Only    dizzy  . Sulfa Antibiotics Nausea Only    Also complained of dizziness  . Keflex [Cephalexin]   . Epinephrine Other (See Comments)    Patient becomes "shaky"  . Penicillins Rash    Pt  reported all over body rash in 1958 Has patient had a PCN reaction causing immediate rash, facial/tongue/throat swelling, SOB or lightheadedness with hypotension:Yes Has patient had a PCN reaction causing severe rash involving mucus membranes or skin necrosis: No Has patient had a PCN reaction that required hospitalization: No Has patient had a PCN  reaction occurring within the last 10 years:No    . Theophyllines Other (See Comments)    Patient becomes "shaky"    Assessment/Plan:  1.  Hyperlipidemia- Pt on maximum tolerated statin and Zetia with LDL still above goal < 70 mg/dL.  Discussed role of PCSK-9 inhibitors in further reducing LDL.  Pt is agreeable to starting therapy.  Discussed [employer] Sureclick versus Pushtronex system. She would prefer the once monthly Pushtronex system so will start with prior authorization.

## 2015-12-05 DIAGNOSIS — M1009 Idiopathic gout, multiple sites: Secondary | ICD-10-CM | POA: Diagnosis not present

## 2015-12-05 DIAGNOSIS — Z79899 Other long term (current) drug therapy: Secondary | ICD-10-CM | POA: Diagnosis not present

## 2015-12-05 DIAGNOSIS — M170 Bilateral primary osteoarthritis of knee: Secondary | ICD-10-CM | POA: Diagnosis not present

## 2015-12-07 ENCOUNTER — Ambulatory Visit (INDEPENDENT_AMBULATORY_CARE_PROVIDER_SITE_OTHER): Payer: Medicare Other | Admitting: Internal Medicine

## 2015-12-07 ENCOUNTER — Telehealth: Payer: Self-pay

## 2015-12-07 ENCOUNTER — Encounter: Payer: Self-pay | Admitting: Internal Medicine

## 2015-12-07 DIAGNOSIS — B001 Herpesviral vesicular dermatitis: Secondary | ICD-10-CM | POA: Insufficient documentation

## 2015-12-07 DIAGNOSIS — I10 Essential (primary) hypertension: Secondary | ICD-10-CM

## 2015-12-07 DIAGNOSIS — R300 Dysuria: Secondary | ICD-10-CM

## 2015-12-07 MED ORDER — LEVOFLOXACIN 250 MG PO TABS: 250.0000 mg | ORAL_TABLET | Freq: Every day | ORAL | 0 refills | Status: AC

## 2015-12-07 NOTE — Assessment & Plan Note (Signed)
C/w liekly uti, for empiric antibx, check ua and cx,  to f/u any worsening symptoms or concerns

## 2015-12-07 NOTE — Telephone Encounter (Signed)
Unfortunately, the best care is to be seen at [facility] to determine the symptoms, exam and then be able to interpret the urine testing with this  Please consider [facility], any MD or Terri Piedra

## 2015-12-07 NOTE — Telephone Encounter (Signed)
Patient has scheduled office visit

## 2015-12-07 NOTE — Progress Notes (Signed)
Subjective:    Patient ID: [name], female    DOB: Jan 29, 1936, 80 y.o.   MRN: [id number]    HPI  Here with 2-3 days onset dysuria, HA, nausea without vomiting, several chills and lower back pain intermittent, Denies urinary symptoms such as frequency, urgency, flank pain, hematuria or fever.  Has chronic incontinence, wears depends.  Urine fairly free flow without control though can go 2 hrs without wetting sometimes.  Has increased fatigue, and several bilat leg and lower abd muscle spasms and discomfort after missed her walker become off balance and fell without other injury.  Also c/o fever blister to riight lower lip x 2 days Past Medical History:  Diagnosis Date  . Allergy   . Anxiety   . Arthritis    no cartlidge in knees  . Asthma   . Asthma 03/24/2015  . Atrial fibrillation ([place]) 01/21/11  . 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 COPD (chronic obstructive pulmonary disease) ([place])   . Essential hypertension 03/24/2015  . Gout    post operative  . Gout 03/24/2015  . Hx of radiation therapy 04/02/11 -05/20/11   left breast  . Hypothyroidism 03/24/2015  . Incontinence of urine   . Malignant neoplasm of upper-outer quadrant of female breast ([place]) 12/26/2010  . OSA (obstructive sleep apnea) 03/24/2015  . Osteoporosis 03/24/2015  . Peripheral neuropathy ([place]) 03/24/2015  . Plantar fasciitis   . Sleep apnea    Past Surgical History:  Procedure Laterality Date  . BREAST LUMPECTOMY W/ NEEDLE LOCALIZATION  01/29/2011   Left with [facility] Dr Margot Chimes  . CHOLECYSTECTOMY  1955  . CORONARY ANGIOPLASTY WITH STENT PLACEMENT  2000  . DILATION AND CURETTAGE OF UTERUS  1976   and 1996  . HAND SURGERY  1992   tendons between thumb and forefinger  . SKIN BIOPSY     1994, 2006, 2008, 2010, 2011, 2011, 2012-  pre-cancerous  . TONSILLECTOMY  1955    reports that she quit smoking about 53 years ago. She smoked 1.00 pack per day. She has never used smokeless tobacco. She  reports that she does not drink alcohol or use drugs. family history includes Asthma in her sister; Cancer in her father; Heart attack in her brother and father; Heart disease in her brother and father. Allergies  Allergen Reactions  . Crestor [Rosuvastatin Calcium]     Severe liver function problems  . Lipitor [Atorvastatin Calcium] Other (See Comments)    Elevated liver function   . Vicodin [Hydrocodone-Acetaminophen] Itching    Itching all over  . Codeine Nausea Only and Anxiety    Also complained of dizziness.  Has same problems with oxycodone and hydrocodone  . Oxycodone-Acetaminophen Nausea Only, Anxiety and Other (See Comments)    Visual Disturbance, Balance Difficulty, Dizzy "Room Spinning; "Swimming"  . Quinine Derivatives Nausea Only    dizzy  . Sulfa Antibiotics Nausea Only    Also complained of dizziness  . Keflex [Cephalexin]   . Epinephrine Other (See Comments)    Patient becomes "shaky"  . Penicillins Rash    Pt reported all over body rash in 1958 Has patient had a PCN reaction causing immediate rash, facial/tongue/throat swelling, SOB or lightheadedness with hypotension:Yes Has patient had a PCN reaction causing severe rash involving mucus membranes or skin necrosis: No Has patient had a PCN reaction that required hospitalization: No Has patient had a PCN reaction occurring within the last 10 years:No    .  Theophyllines Other (See Comments)    Patient becomes "shaky"   Current Outpatient Prescriptions on File Prior to Visit  Medication Sig Dispense Refill  . albuterol (PROVENTIL HFA;VENTOLIN HFA) 108 (90 Base) MCG/ACT inhaler Inhale 2 puffs into the lungs as needed for wheezing or shortness of breath. 18 g 3  . albuterol (PROVENTIL) (2.5 MG/3ML) 0.083% nebulizer solution Take 3 mLs (2.5 mg total) by nebulization every 6 (six) hours as needed for wheezing or shortness of breath. 75 mL 12  . alendronate (FOSAMAX) 70 MG tablet Take 1 tablet (70 mg total) by mouth once  a week. Patient takes on Saturday 4 tablet 3  . allopurinol (ZYLOPRIM) 100 MG tablet Take 300 mg by mouth daily.     Marland Kitchen ALPRAZolam (XANAX) 0.5 MG tablet Take 1-2 tablets (0.5-1 mg total) by mouth 2 (two) times daily as needed for anxiety or sleep. 60 tablet 3  . atenolol (TENORMIN) 25 MG tablet Take 1 tablet (25 mg total) by mouth daily. 90 tablet 0  . Biotin 5000 MCG CAPS Take 5,000 mcg by mouth daily.     Marland Kitchen buPROPion (WELLBUTRIN XL) 150 MG 24 hr tablet Take 1 tablet (150 mg total) by mouth daily. 90 tablet 3  . cholecalciferol (VITAMIN D) 1000 UNITS tablet Take 1,000 Units by mouth daily.    Marland Kitchen co-enzyme Q-10 50 MG capsule Take 400 mg by mouth daily.    . colchicine (COLCRYS) 0.6 MG tablet Take 0.6 mg by mouth daily as needed (flare  up).     . CRESTOR 10 MG tablet take 1 tablet by mouth TWO TIMES A WEEK.AS OF10/16/2014, TAKING 2 TIMES WEEKLY 10 tablet 1  . escitalopram (LEXAPRO) 10 MG tablet Take 1 tablet (10 mg total) by mouth daily. 30 tablet 3  . ezetimibe (ZETIA) 10 MG tablet take 1/2 to 1 tablet by mouth daily 90 tablet 3  . fexofenadine (ALLEGRA) 180 MG tablet Take 1 tablet (180 mg total) by mouth daily. 30 tablet 3  . fluticasone (FLONASE) 50 MCG/ACT nasal spray Place 1 spray into both nostrils daily.     . Fluticasone-Salmeterol (ADVAIR) 500-50 MCG/DOSE AEPB Inhale 1 puff into the lungs every 12 (twelve) hours. 60 each 3  . gabapentin (NEURONTIN) 100 MG capsule Take 100 mg by mouth daily.  0  . hydrochlorothiazide (HYDRODIURIL) 25 MG tablet Take 2 tablets (50 mg total) by mouth daily. 60 tablet 3  . letrozole (FEMARA) 2.5 MG tablet Take 1 tablet (2.5 mg total) by mouth daily. 90 tablet 4  . levocetirizine (XYZAL) 5 MG tablet Take 1 tablet (5 mg total) by mouth at bedtime. 30 tablet 3  . levothyroxine (SYNTHROID, LEVOTHROID) 50 MCG tablet Take 1 tablet (50 mcg total) by mouth daily. 30 tablet 3  . montelukast (SINGULAIR) 10 MG tablet Take 1 tablet (10 mg total) by mouth at bedtime. 30  tablet 3  . nystatin cream (MYCOSTATIN) Apply 1 application topically daily as needed for dry skin.   0  . potassium chloride SA (KLOR-CON M20) 20 MEQ tablet Take 1 tablet (20 mEq total) by mouth daily. 30 tablet 3  . traMADol (ULTRAM) 50 MG tablet Take 50 mg by mouth every 12 (twelve) hours as needed for moderate pain.     . traMADol-acetaminophen (ULTRACET) 37.5-325 MG tablet Take 1-2 tablets by mouth every 6 (six) hours as needed for moderate pain or severe pain.   0  . vitamin C (ASCORBIC ACID) 500 MG tablet Take 500 mg by mouth daily.    Marland Kitchen  XARELTO 20 MG TABS tablet take 1 tablet by mouth once daily 90 tablet 0   No current facility-administered medications on file prior to visit.    Review of Systems  Constitutional: Negative for unusual diaphoresis or night sweats HENT: Negative for ear swelling or discharge Eyes: Negative for worsening visual haziness  Respiratory: Negative for choking and stridor.   Gastrointestinal: Negative for distension or worsening eructation Genitourinary: Negative for retention or change in urine volume.  Musculoskeletal: Negative for other MSK pain or swelling Skin: Negative for color change and worsening wound Neurological: Negative for tremors and numbness other than noted  Psychiatric/Behavioral: Negative for decreased concentration or agitation other than above       Objective:   Physical Exam BP (P) 120/68   Pulse (P) 67   Temp (P) 97.5 F (36.4 C)   SpO2 (P) 98%  VS noted,  Constitutional: Pt appears in no apparent distress HENT: Head: NCAT.  Right Ear: External ear normal.  Left Ear: External ear normal.  Eyes: . Pupils are equal, round, and reactive to light. Conjunctivae and EOM are normal Neck: Normal range of motion. Neck supple.  Cardiovascular: Normal rate and regular rhythm.   Pulmonary/Chest: Effort normal and breath sounds without rales or wheezing.  Abd:  Soft, ND, + BS with low mid abd tender, no guarding or  rebound Neurological: Pt is alert. Not confused , motor grossly intact Skin: Skin is warm. No rash, no LE edema, + fever blister right lower lip Psychiatric: Pt behavior is normal. No agitation.      Assessment & Plan:

## 2015-12-07 NOTE — Patient Instructions (Signed)
Please take all new medication as prescribed - the antibiotic  Your specimen will be sent for the culture  Please continue all other medications as before, and refills have been done if requested.  Please have the pharmacy call with any other refills you may need.  Please keep your appointments with your specialists as you may have planned

## 2015-12-07 NOTE — Assessment & Plan Note (Signed)
stable overall by history and exam, recent data reviewed with pt, and pt to continue medical treatment as before,  to f/u any worsening symptoms or concerns BP Readings from Last 3 Encounters:  12/07/15 (P) 120/68  11/21/15 130/70  10/12/15 121/69

## 2015-12-07 NOTE — Telephone Encounter (Signed)
Patient called and believes she has UTI. She wanted to know if she could just come into the lab and leave a sample and get an abx. Please advise or follow up with patient. Thank you!

## 2015-12-07 NOTE — Assessment & Plan Note (Signed)
Ok for otc abreva prn,  to f/u any worsening symptoms or concerns

## 2015-12-08 ENCOUNTER — Other Ambulatory Visit (INDEPENDENT_AMBULATORY_CARE_PROVIDER_SITE_OTHER): Payer: Medicare Other

## 2015-12-08 DIAGNOSIS — R300 Dysuria: Secondary | ICD-10-CM

## 2015-12-08 LAB — URINALYSIS, ROUTINE W REFLEX MICROSCOPIC
BILIRUBIN URINE: NEGATIVE
KETONES UR: NEGATIVE
NITRITE: NEGATIVE
PH: 6 (ref 5.0–8.0)
Specific Gravity, Urine: 1.01 (ref 1.000–1.030)
Total Protein, Urine: NEGATIVE
UROBILINOGEN UA: 0.2 (ref 0.0–1.0)
Urine Glucose: NEGATIVE

## 2015-12-09 LAB — URINE CULTURE: Organism ID, Bacteria: NO GROWTH

## 2015-12-15 DIAGNOSIS — M170 Bilateral primary osteoarthritis of knee: Secondary | ICD-10-CM | POA: Diagnosis not present

## 2015-12-20 DIAGNOSIS — L570 Actinic keratosis: Secondary | ICD-10-CM | POA: Diagnosis not present

## 2016-01-03 ENCOUNTER — Other Ambulatory Visit: Payer: Self-pay | Admitting: Interventional Cardiology

## 2016-01-03 ENCOUNTER — Other Ambulatory Visit: Payer: Self-pay | Admitting: Internal Medicine

## 2016-01-04 NOTE — Telephone Encounter (Signed)
8/4 urine studies negative for acute problem  Ok for K refill early - done erx  .xanax Done hardcopy to [facility]

## 2016-01-04 NOTE — Telephone Encounter (Signed)
Pt left msg on triage stating she is going out of town on Sat, and needing to get her Potassium refill earlier than the 9/4. Pharmacy will be sending a request, also want UA results...[name]

## 2016-01-04 NOTE — Telephone Encounter (Signed)
Notified pt w/MD response concerning UA, and refill sent to pharmacy...[name]

## 2016-01-05 ENCOUNTER — Other Ambulatory Visit: Payer: Self-pay | Admitting: Interventional Cardiology

## 2016-01-10 ENCOUNTER — Other Ambulatory Visit: Payer: Self-pay | Admitting: Internal Medicine

## 2016-01-24 DIAGNOSIS — G4733 Obstructive sleep apnea (adult) (pediatric): Secondary | ICD-10-CM | POA: Diagnosis not present

## 2016-01-28 ENCOUNTER — Other Ambulatory Visit: Payer: Self-pay | Admitting: Interventional Cardiology

## 2016-01-28 ENCOUNTER — Other Ambulatory Visit: Payer: Self-pay | Admitting: Internal Medicine

## 2016-02-08 DIAGNOSIS — H40013 Open angle with borderline findings, low risk, bilateral: Secondary | ICD-10-CM | POA: Diagnosis not present

## 2016-02-08 DIAGNOSIS — H04123 Dry eye syndrome of bilateral lacrimal glands: Secondary | ICD-10-CM | POA: Diagnosis not present

## 2016-02-08 DIAGNOSIS — H01003 Unspecified blepharitis right eye, unspecified eyelid: Secondary | ICD-10-CM | POA: Diagnosis not present

## 2016-02-08 DIAGNOSIS — H1851 Endothelial corneal dystrophy: Secondary | ICD-10-CM | POA: Diagnosis not present

## 2016-02-20 ENCOUNTER — Telehealth: Payer: Self-pay | Admitting: Pharmacist Clinician (PhC)/ Clinical Pharmacy Specialist

## 2016-02-20 NOTE — Telephone Encounter (Signed)
Spoke with patient, she had lost the phone # to [employer] and has not yet called.    Phone number given, patient states she will call today and she knows to call us once she gets medication so we can arrange follow up labs.

## 2016-02-26 ENCOUNTER — Telehealth: Payer: Self-pay | Admitting: Interventional Cardiology

## 2016-02-26 ENCOUNTER — Telehealth: Payer: Self-pay | Admitting: Pharmacist Clinician (PhC)/ Clinical Pharmacy Specialist

## 2016-02-26 NOTE — Telephone Encounter (Signed)
Disregard

## 2016-02-26 NOTE — Telephone Encounter (Signed)
Patient received Repatha [employer]ronix from [employer].    She will read thru instructions and feels that she can give herself first dose without assistance.  Assured her this was fine, but if she felt more comfortable coming into the office to give [place] a call.    She will need to have labs in December after 3rd dose.

## 2016-03-04 ENCOUNTER — Other Ambulatory Visit: Payer: Self-pay | Admitting: Internal Medicine

## 2016-03-12 ENCOUNTER — Telehealth: Payer: Self-pay | Admitting: Pharmacist Clinician (PhC)/ Clinical Pharmacy Specialist

## 2016-03-12 ENCOUNTER — Telehealth: Payer: Self-pay | Admitting: Oncology

## 2016-03-12 DIAGNOSIS — E785 Hyperlipidemia, unspecified: Secondary | ICD-10-CM

## 2016-03-12 NOTE — Telephone Encounter (Signed)
Patient used [employer] system without problem.  She will have labs drawn sometime after 2nd or 3rd monthly injection, not sure when because of the holidays, but may need to wait until after Christmas.  Orders placed in system, she is to call to arrange appt for lab draw.

## 2016-03-12 NOTE — Telephone Encounter (Signed)
Returned call to patient to confirm next scheduled appointments.

## 2016-03-13 DIAGNOSIS — M1711 Unilateral primary osteoarthritis, right knee: Secondary | ICD-10-CM | POA: Diagnosis not present

## 2016-03-13 DIAGNOSIS — M1712 Unilateral primary osteoarthritis, left knee: Secondary | ICD-10-CM | POA: Diagnosis not present

## 2016-03-13 DIAGNOSIS — L570 Actinic keratosis: Secondary | ICD-10-CM | POA: Diagnosis not present

## 2016-03-19 ENCOUNTER — Other Ambulatory Visit: Payer: Self-pay | Admitting: Internal Medicine

## 2016-03-21 ENCOUNTER — Ambulatory Visit (INDEPENDENT_AMBULATORY_CARE_PROVIDER_SITE_OTHER): Payer: Medicare Other | Admitting: Internal Medicine

## 2016-03-21 VITALS — BP 130/82 | HR 53 | Temp 98.3°F | Resp 16 | Wt 311.0 lb

## 2016-03-21 DIAGNOSIS — F419 Anxiety disorder, unspecified: Secondary | ICD-10-CM

## 2016-03-21 DIAGNOSIS — E039 Hypothyroidism, unspecified: Secondary | ICD-10-CM | POA: Diagnosis not present

## 2016-03-21 DIAGNOSIS — F32A Depression, unspecified: Secondary | ICD-10-CM

## 2016-03-21 DIAGNOSIS — I10 Essential (primary) hypertension: Secondary | ICD-10-CM | POA: Diagnosis not present

## 2016-03-21 DIAGNOSIS — J45909 Unspecified asthma, uncomplicated: Secondary | ICD-10-CM | POA: Diagnosis not present

## 2016-03-21 DIAGNOSIS — Z0001 Encounter for general adult medical examination with abnormal findings: Secondary | ICD-10-CM

## 2016-03-21 DIAGNOSIS — F418 Other specified anxiety disorders: Secondary | ICD-10-CM | POA: Diagnosis not present

## 2016-03-21 DIAGNOSIS — F329 Major depressive disorder, single episode, unspecified: Secondary | ICD-10-CM

## 2016-03-21 DIAGNOSIS — R739 Hyperglycemia, unspecified: Secondary | ICD-10-CM

## 2016-03-21 DIAGNOSIS — R7303 Prediabetes: Secondary | ICD-10-CM | POA: Insufficient documentation

## 2016-03-21 MED ORDER — HYDROCHLOROTHIAZIDE 25 MG PO TABS: 50.0000 mg | ORAL_TABLET | Freq: Every day | ORAL | 3 refills | Status: DC

## 2016-03-21 MED ORDER — TRIAMCINOLONE ACETONIDE 55 MCG/ACT NA AERO: 2.0000 | INHALATION_SPRAY | Freq: Every day | NASAL | 12 refills | Status: DC

## 2016-03-21 NOTE — Progress Notes (Signed)
Pre visit review using our clinic review tool, if applicable. No additional management support is needed unless otherwise documented below in the visit note. 

## 2016-03-21 NOTE — Patient Instructions (Addendum)
OK to change the flonase (fluticasone) to nasacort   Please continue all other medications as before, and refills have been done if requested.  Please have the pharmacy call with any other refills you may need.  Please continue your efforts at being more active, low cholesterol diet, and weight control.  Please keep your appointments with your specialists as you may have planned  Please return in 6 months, or sooner if needed, with Lab testing done 3-5 days before

## 2016-03-21 NOTE — Progress Notes (Signed)
Subjective:    Patient ID: [name], female    DOB: 1936-03-03, 80 y.o.   MRN: [id number]  HPI  Here to f/u; overall doing ok,  Pt denies chest pain, increasing sob or doe, wheezing, orthopnea, PND, increased LE swelling, palpitations, dizziness or syncope.  Pt denies new neurological symptoms such as new headache, or facial or extremity weakness or numbness.  Pt denies polydipsia, polyuria, or low sugar episode.   Pt denies new neurological symptoms such as new headache, or facial or extremity weakness or numbness.   Pt states overall good compliance with meds, mostly trying to follow appropriate diet, with wt overall stable to increased several lbs Wt Readings from Last 3 Encounters:  03/21/16 (!) 311 lb (141.1 kg)  11/21/15 (!) 309 lb (140.2 kg)  10/12/15 (!) 306 lb 11.2 oz (139.1 kg)  Recent asthma seasonal better with singulair.   Due to start repatha soon per cardiology. Allergy symptoms improved with the flonase, but concerned about risk of nosebleed.  No overt bleeding on the xarelto.  Also getting cortisone to knees every 3 mo.  Gel shots did not work.  Has bilat trigger fingers but holding off on cortisone for now  Using walker at home all the time, uses in wheelchair out of the home, and no recent falls  Also S/p recent tx for mult skin lesions to face, with treatment areas now healing  Denies urinary symptoms such as dysuria, frequency, urgency, flank pain, hematuria or n/v, fever, chills, is s/p UTI feb 2017 without overt recurrence.  Denies hyper or hypo thyroid symptoms such as voice, skin or hair change. Denies worsening depressive symptoms, suicidal ideation, or panic Past Medical History:  Diagnosis Date  . Allergy   . Anxiety   . Arthritis    no cartlidge in knees  . Asthma   . Asthma 03/24/2015  . Atrial fibrillation ([place]) 01/21/11  . 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 COPD (chronic obstructive pulmonary disease) ([place])   . Essential hypertension  03/24/2015  . Gout    post operative  . Gout 03/24/2015  . Hx of radiation therapy 04/02/11 -05/20/11   left breast  . Hypothyroidism 03/24/2015  . Incontinence of urine   . Malignant neoplasm of upper-outer quadrant of female breast ([place]) 12/26/2010  . OSA (obstructive sleep apnea) 03/24/2015  . Osteoporosis 03/24/2015  . Peripheral neuropathy ([place]) 03/24/2015  . Plantar fasciitis   . Sleep apnea    Past Surgical History:  Procedure Laterality Date  . BREAST LUMPECTOMY W/ NEEDLE LOCALIZATION  01/29/2011   Left with [facility] Dr Margot Chimes  . CHOLECYSTECTOMY  1955  . CORONARY ANGIOPLASTY WITH STENT PLACEMENT  2000  . DILATION AND CURETTAGE OF UTERUS  1976   and 1996  . HAND SURGERY  1992   tendons between thumb and forefinger  . SKIN BIOPSY     1994, 2006, 2008, 2010, 2011, 2011, 2012-  pre-cancerous  . TONSILLECTOMY  1955    reports that she quit smoking about 53 years ago. She smoked 1.00 pack per day. She has never used smokeless tobacco. She reports that she does not drink alcohol or use drugs. family history includes Asthma in her sister; Cancer in her father; Heart attack in her brother and father; Heart disease in her brother and father. Allergies  Allergen Reactions  . Crestor [Rosuvastatin Calcium]     Severe liver function problems  . Lipitor [Atorvastatin Calcium] Other (See Comments)  Elevated liver function   . Vicodin [Hydrocodone-Acetaminophen] Itching    Itching all over  . Codeine Nausea Only and Anxiety    Also complained of dizziness.  Has same problems with oxycodone and hydrocodone  . Oxycodone-Acetaminophen Nausea Only, Anxiety and Other (See Comments)    Visual Disturbance, Balance Difficulty, Dizzy "Room Spinning; "Swimming"  . Quinine Derivatives Nausea Only    dizzy  . Sulfa Antibiotics Nausea Only    Also complained of dizziness  . Keflex [Cephalexin]   . Epinephrine Other (See Comments)    Patient becomes "shaky"  . Penicillins Rash    Pt reported  all over body rash in 1958 Has patient had a PCN reaction causing immediate rash, facial/tongue/throat swelling, SOB or lightheadedness with hypotension:Yes Has patient had a PCN reaction causing severe rash involving mucus membranes or skin necrosis: No Has patient had a PCN reaction that required hospitalization: No Has patient had a PCN reaction occurring within the last 10 years:No    . Theophyllines Other (See Comments)    Patient becomes "shaky"   Current Outpatient Prescriptions on File Prior to Visit  Medication Sig Dispense Refill  . albuterol (PROVENTIL HFA;VENTOLIN HFA) 108 (90 Base) MCG/ACT inhaler Inhale 2 puffs into the lungs as needed for wheezing or shortness of breath. 18 g 3  . albuterol (PROVENTIL) (2.5 MG/3ML) 0.083% nebulizer solution Take 3 mLs (2.5 mg total) by nebulization every 6 (six) hours as needed for wheezing or shortness of breath. 75 mL 12  . alendronate (FOSAMAX) 70 MG tablet take 1 tablet by mouth every week 4 tablet 3  . allopurinol (ZYLOPRIM) 100 MG tablet Take 300 mg by mouth daily.     Marland Kitchen ALPRAZolam (XANAX) 0.5 MG tablet take 1 to 2 tablets by mouth twice a day if needed for anxiety or sleep 60 tablet 3  . atenolol (TENORMIN) 25 MG tablet take 1 tablet by mouth once daily 90 tablet 3  . Biotin 5000 MCG CAPS Take 5,000 mcg by mouth daily.     Marland Kitchen buPROPion (WELLBUTRIN XL) 150 MG 24 hr tablet Take 1 tablet (150 mg total) by mouth daily. 90 tablet 3  . cholecalciferol (VITAMIN D) 1000 UNITS tablet Take 1,000 Units by mouth daily.    Marland Kitchen co-enzyme Q-10 50 MG capsule Take 400 mg by mouth daily.    . colchicine (COLCRYS) 0.6 MG tablet Take 0.6 mg by mouth daily as needed (flare  up).     . escitalopram (LEXAPRO) 10 MG tablet take 1 tablet by mouth once daily 30 tablet 3  . ezetimibe (ZETIA) 10 MG tablet take 1/2 to 1 tablet by mouth daily 90 tablet 3  . fexofenadine (ALLEGRA) 180 MG tablet Take 1 tablet (180 mg total) by mouth daily. 30 tablet 3  .  Fluticasone-Salmeterol (ADVAIR) 500-50 MCG/DOSE AEPB Inhale 1 puff into the lungs every 12 (twelve) hours. 60 each 3  . gabapentin (NEURONTIN) 100 MG capsule Take 100 mg by mouth daily.  0  . letrozole (FEMARA) 2.5 MG tablet Take 1 tablet (2.5 mg total) by mouth daily. 90 tablet 4  . levocetirizine (XYZAL) 5 MG tablet take 1 tablet by mouth at bedtime 30 tablet 3  . levothyroxine (SYNTHROID, LEVOTHROID) 50 MCG tablet Take 1 tablet (50 mcg total) by mouth daily. 30 tablet 3  . montelukast (SINGULAIR) 10 MG tablet take 1 tablet by mouth at bedtime 30 tablet 3  . nystatin cream (MYCOSTATIN) Apply 1 application topically daily as needed for dry skin.  0  . potassium chloride SA (K-DUR,KLOR-CON) 20 MEQ tablet take 1 tablet by mouth once daily 30 tablet 3  . rosuvastatin (CRESTOR) 10 MG tablet take 1 tablet by mouth 2 TIMESA A WEEK 10 tablet 9  . traMADol (ULTRAM) 50 MG tablet Take 50 mg by mouth every 12 (twelve) hours as needed for moderate pain.     . traMADol-acetaminophen (ULTRACET) 37.5-325 MG tablet Take 1-2 tablets by mouth every 6 (six) hours as needed for moderate pain or severe pain.   0  . vitamin C (ASCORBIC ACID) 500 MG tablet Take 500 mg by mouth daily.    Alveda Reasons 20 MG TABS tablet take 1 tablet by mouth once daily 90 tablet 2   No current facility-administered medications on file prior to visit.     Review of Systems  Constitutional: Negative for unusual diaphoresis or night sweats HENT: Negative for ear swelling or discharge Eyes: Negative for worsening visual haziness  Respiratory: Negative for choking and stridor.   Gastrointestinal: Negative for distension or worsening eructation Genitourinary: Negative for retention or change in urine volume.  Musculoskeletal: Negative for other MSK pain or swelling Skin: Negative for color change and worsening wound Neurological: Negative for tremors and numbness other than noted  Psychiatric/Behavioral: Negative for decreased  concentration or agitation other than above   All other system neg per pt    Objective:   Physical Exam BP 130/82   Pulse (!) 53   Temp 98.3 F (36.8 C) (Oral)   Resp 16   Wt (!) 311 lb (141.1 kg)   SpO2 95%   BMI 51.75 kg/m  VS noted, examined in wheelchair Constitutional: Pt appears in no apparent distress HENT: Head: NCAT.  Right Ear: External ear normal.  Left Ear: External ear normal.  Bilat tm's with mild erythema.  Max sinus areas non tender.  Pharynx with mild erythema, no exudate Eyes: . Pupils are equal, round, and reactive to light. Conjunctivae and EOM are normal Neck: Normal range of motion. Neck supple.  Cardiovascular: Normal rate and regular rhythm.   Pulmonary/Chest: Effort normal and breath sounds without rales or wheezing.  Abd:  Soft, NT, ND, + BS, no flank tender Neurological: Pt is alert. Not confused , motor grossly intact Skin: Skin is warm. No rash, no LE edema Psychiatric: Pt behavior is normal. No agitation.  No other new exam findings    Assessment & Plan:

## 2016-03-24 NOTE — Assessment & Plan Note (Signed)
stable overall by history and exam, recent data reviewed with pt, and pt to continue medical treatment as before,  to f/u any worsening symptoms or concerns Lab Results  Component Value Date   WBC 6.2 10/12/2015   HGB 13.4 10/12/2015   HCT 41.2 10/12/2015   PLT 137 (L) 10/12/2015   GLUCOSE 87 10/12/2015   CHOL 193 11/21/2015   TRIG 105 11/21/2015   HDL 62 11/21/2015   LDLCALC 110 11/21/2015   ALT 16 11/21/2015   AST 20 11/21/2015   NA 142 10/12/2015   K 3.6 10/12/2015   CL 105 06/30/2015   CREATININE 0.9 10/12/2015   BUN 22.3 10/12/2015   CO2 26 10/12/2015   TSH 2.17 06/30/2015

## 2016-03-24 NOTE — Assessment & Plan Note (Signed)
For a1c with next labs, cont work on wt loss, diet efforts

## 2016-03-24 NOTE — Assessment & Plan Note (Signed)
stable overall by history and exam, recent data reviewed with pt, and pt to continue medical treatment as before,  to f/u any worsening symptoms or concerns BP Readings from Last 3 Encounters:  03/21/16 130/82  12/07/15 (P) 120/68  11/21/15 130/70

## 2016-03-24 NOTE — Assessment & Plan Note (Signed)
stable overall by history and exam, recent data reviewed with pt, and pt to continue medical treatment as before,  to f/u any worsening symptoms or concerns, cont singulair

## 2016-03-24 NOTE — Assessment & Plan Note (Signed)
stable overall by history and exam, recent data reviewed with pt, and pt to continue medical treatment as before,  to f/u any worsening symptoms or concerns Lab Results  Component Value Date   TSH 2.17 06/30/2015

## 2016-04-04 DIAGNOSIS — G4733 Obstructive sleep apnea (adult) (pediatric): Secondary | ICD-10-CM | POA: Diagnosis not present

## 2016-04-24 ENCOUNTER — Telehealth: Payer: Self-pay | Admitting: Emergency Medicine

## 2016-04-24 NOTE — Telephone Encounter (Signed)
Pt called and stated she thinks she has another UTI. She is wondering if you can call her in another prescription for this or does she need to come in for another appt? Please advise thanks.

## 2016-04-24 NOTE — Telephone Encounter (Signed)
Please make ROV, as recurrent infections lead to resistant bacteria and we would need to have a culture to make sure about this  Also the aug 2017 urine cx was negative for infection, so sometime some symptoms dont turn out to be actual infection

## 2016-04-25 NOTE — Telephone Encounter (Signed)
Scheduled patient appointment.

## 2016-04-26 ENCOUNTER — Encounter: Payer: Self-pay | Admitting: Internal Medicine

## 2016-04-26 ENCOUNTER — Ambulatory Visit (INDEPENDENT_AMBULATORY_CARE_PROVIDER_SITE_OTHER): Payer: Medicare Other | Admitting: Internal Medicine

## 2016-04-26 VITALS — BP 138/78 | HR 66 | Temp 98.8°F | Resp 20 | Wt 302.0 lb

## 2016-04-26 DIAGNOSIS — J4521 Mild intermittent asthma with (acute) exacerbation: Secondary | ICD-10-CM

## 2016-04-26 DIAGNOSIS — R300 Dysuria: Secondary | ICD-10-CM | POA: Diagnosis not present

## 2016-04-26 DIAGNOSIS — R310 Gross hematuria: Secondary | ICD-10-CM | POA: Diagnosis not present

## 2016-04-26 LAB — POCT URINALYSIS DIPSTICK
Bilirubin, UA: NEGATIVE
Blood, UA: NEGATIVE
Glucose, UA: NEGATIVE
Ketones, UA: NEGATIVE
LEUKOCYTES UA: NEGATIVE
NITRITE UA: NEGATIVE
PH UA: 6.5
PROTEIN UA: NEGATIVE
Spec Grav, UA: 1.02
UROBILINOGEN UA: NEGATIVE

## 2016-04-26 MED ORDER — NITROFURANTOIN MONOHYD MACRO 100 MG PO CAPS: 100.0000 mg | ORAL_CAPSULE | Freq: Two times a day (BID) | ORAL | 0 refills | Status: DC

## 2016-04-26 MED ORDER — PREDNISONE 10 MG PO TABS: ORAL_TABLET | ORAL | 0 refills | Status: DC

## 2016-04-26 NOTE — Patient Instructions (Signed)
Please take all new medication as prescribed - the antibiotic, and prednisone  Please continue all other medications as before, and refills have been done if requested.  Please have the pharmacy call with any other refills you may need.  Please keep your appointments with your specialists as you may have planned  The specimen will be sent for culture; if the culture is negative we will need to consider further evaluation with CT and urology evaluation

## 2016-04-26 NOTE — Progress Notes (Signed)
Pre visit review using our clinic review tool, if applicable. No additional management support is needed unless otherwise documented below in the visit note. 

## 2016-04-26 NOTE — Assessment & Plan Note (Signed)
Recent in the past wk, has remote hx of stones but no other typical pain, more liekly related to possible UTI but r/o other such as stone or malignancy, to f/u urine cx- if neg, should consider CT abd/pelvis and/or urology referral

## 2016-04-26 NOTE — Assessment & Plan Note (Signed)
/  Mild to mod, for predpac asd,  to f/u any worsening symptoms or concerns 

## 2016-04-26 NOTE — Progress Notes (Signed)
Subjective:    Patient ID: [name], female    DOB: 14-Dec-1935, 80 y.o.   MRN: [id number]  HPI  Here with 2-3 days onset dysuria to start and end urination only, with 1 wk intermittent several drops blood with wiping after urination, but Denies urinary symptoms such as dysuria, frequency, urgency, flank pain, or n/v, fever, chills. Also with 1 wk gradually worsening slight ST, non prod cough, wheezing and mild sob, but Pt denies chest pain, orthopnea, PND, increased LE swelling, palpitations, dizziness or syncope.  Pt denies polydipsia, polyuria,  Past Medical History:  Diagnosis Date  . Allergy   . Anxiety   . Arthritis    no cartlidge in knees  . Asthma   . Asthma 03/24/2015  . Atrial fibrillation ([place]) 01/21/11  . Breast cancer ([place]) Receptor + her2 _ 12/26/2010   left  . COPD (chronic obstructive pulmonary disease) ([place])   . Essential hypertension 03/24/2015  . Gout    post operative  . Gout 03/24/2015  . Hx of radiation therapy 04/02/11 -05/20/11   left breast  . Hypothyroidism 03/24/2015  . Incontinence of urine   . Malignant neoplasm of upper-outer quadrant of female breast ([place]) 12/26/2010  . OSA (obstructive sleep apnea) 03/24/2015  . Osteoporosis 03/24/2015  . Peripheral neuropathy ([place]) 03/24/2015  . Plantar fasciitis   . Sleep apnea    Past Surgical History:  Procedure Laterality Date  . BREAST LUMPECTOMY W/ NEEDLE LOCALIZATION  01/29/2011   Left with SLN Dr Margot Chimes  . CHOLECYSTECTOMY  1955  . CORONARY ANGIOPLASTY WITH STENT PLACEMENT  2000  . DILATION AND CURETTAGE OF UTERUS  1976   and 1996  . HAND SURGERY  1992   tendons between thumb and forefinger  . SKIN BIOPSY     1994, 2006, 2008, 2010, 2011, 2011, 2012-  pre-cancerous  . TONSILLECTOMY  1955    reports that she quit smoking about 53 years ago. She smoked 1.00 pack per day. She has never used smokeless tobacco. She reports that she does not drink alcohol or use drugs. family history includes Asthma in  her sister; Cancer in her father; Heart attack in her brother and father; Heart disease in her brother and father. Allergies  Allergen Reactions  . Crestor [Rosuvastatin Calcium]     Severe liver function problems  . Lipitor [Atorvastatin Calcium] Other (See Comments)    Elevated liver function   . Vicodin [Hydrocodone-Acetaminophen] Itching    Itching all over  . Codeine Nausea Only and Anxiety    Also complained of dizziness.  Has same problems with oxycodone and hydrocodone  . Oxycodone-Acetaminophen Nausea Only, Anxiety and Other (See Comments)    Visual Disturbance, Balance Difficulty, Dizzy "Room Spinning; "Swimming"  . Quinine Derivatives Nausea Only    dizzy  . Sulfa Antibiotics Nausea Only    Also complained of dizziness  . Keflex [Cephalexin]   . Epinephrine Other (See Comments)    Patient becomes "shaky"  . Penicillins Rash    Pt reported all over body rash in 1958 Has patient had a PCN reaction causing immediate rash, facial/tongue/throat swelling, SOB or lightheadedness with hypotension:Yes Has patient had a PCN reaction causing severe rash involving mucus membranes or skin necrosis: No Has patient had a PCN reaction that required hospitalization: No Has patient had a PCN reaction occurring within the last 10 years:No    . Theophyllines Other (See Comments)    Patient becomes "shaky"   Current Outpatient Prescriptions on File  Prior to Visit  Medication Sig Dispense Refill  . albuterol (PROVENTIL HFA;VENTOLIN HFA) 108 (90 Base) MCG/ACT inhaler Inhale 2 puffs into the lungs as needed for wheezing or shortness of breath. 18 g 3  . albuterol (PROVENTIL) (2.5 MG/3ML) 0.083% nebulizer solution Take 3 mLs (2.5 mg total) by nebulization every 6 (six) hours as needed for wheezing or shortness of breath. 75 mL 12  . alendronate (FOSAMAX) 70 MG tablet take 1 tablet by mouth every week 4 tablet 3  . allopurinol (ZYLOPRIM) 100 MG tablet Take 300 mg by mouth daily.     Marland Kitchen  ALPRAZolam (XANAX) 0.5 MG tablet take 1 to 2 tablets by mouth twice a day if needed for anxiety or sleep 60 tablet 3  . atenolol (TENORMIN) 25 MG tablet take 1 tablet by mouth once daily 90 tablet 3  . Biotin 5000 MCG CAPS Take 5,000 mcg by mouth daily.     Marland Kitchen buPROPion (WELLBUTRIN XL) 150 MG 24 hr tablet Take 1 tablet (150 mg total) by mouth daily. 90 tablet 3  . cholecalciferol (VITAMIN D) 1000 UNITS tablet Take 1,000 Units by mouth daily.    Marland Kitchen co-enzyme Q-10 50 MG capsule Take 400 mg by mouth daily.    . colchicine (COLCRYS) 0.6 MG tablet Take 0.6 mg by mouth daily as needed (flare  up).     . escitalopram (LEXAPRO) 10 MG tablet take 1 tablet by mouth once daily 30 tablet 3  . ezetimibe (ZETIA) 10 MG tablet take 1/2 to 1 tablet by mouth daily 90 tablet 3  . fexofenadine (ALLEGRA) 180 MG tablet Take 1 tablet (180 mg total) by mouth daily. 30 tablet 3  . Fluticasone-Salmeterol (ADVAIR) 500-50 MCG/DOSE AEPB Inhale 1 puff into the lungs every 12 (twelve) hours. 60 each 3  . gabapentin (NEURONTIN) 100 MG capsule Take 100 mg by mouth daily.  0  . hydrochlorothiazide (HYDRODIURIL) 25 MG tablet Take 2 tablets (50 mg total) by mouth daily. 180 tablet 3  . letrozole (FEMARA) 2.5 MG tablet Take 1 tablet (2.5 mg total) by mouth daily. 90 tablet 4  . levocetirizine (XYZAL) 5 MG tablet take 1 tablet by mouth at bedtime 30 tablet 3  . levothyroxine (SYNTHROID, LEVOTHROID) 50 MCG tablet Take 1 tablet (50 mcg total) by mouth daily. 30 tablet 3  . montelukast (SINGULAIR) 10 MG tablet take 1 tablet by mouth at bedtime 30 tablet 3  . nystatin cream (MYCOSTATIN) Apply 1 application topically daily as needed for dry skin.   0  . potassium chloride SA (K-DUR,KLOR-CON) 20 MEQ tablet take 1 tablet by mouth once daily 30 tablet 3  . rosuvastatin (CRESTOR) 10 MG tablet take 1 tablet by mouth 2 TIMESA A WEEK 10 tablet 9  . traMADol (ULTRAM) 50 MG tablet Take 50 mg by mouth every 12 (twelve) hours as needed for moderate  pain.     . traMADol-acetaminophen (ULTRACET) 37.5-325 MG tablet Take 1-2 tablets by mouth every 6 (six) hours as needed for moderate pain or severe pain.   0  . triamcinolone (NASACORT AQ) 55 MCG/ACT AERO nasal inhaler Place 2 sprays into the nose daily. 1 Inhaler 12  . vitamin C (ASCORBIC ACID) 500 MG tablet Take 500 mg by mouth daily.    Alveda Reasons 20 MG TABS tablet take 1 tablet by mouth once daily 90 tablet 2   No current facility-administered medications on file prior to visit.    Review of Systems  Constitutional: Negative for unusual diaphoresis  or night sweats HENT: Negative for ear swelling or discharge Eyes: Negative for worsening visual haziness  Respiratory: Negative for choking and stridor.   Gastrointestinal: Negative for distension or worsening eructation Genitourinary: Negative for retention or change in urine volume.  Musculoskeletal: Negative for other MSK pain or swelling Skin: Negative for color change and worsening wound Neurological: Negative for tremors and numbness other than noted  Psychiatric/Behavioral: Negative for decreased concentration or agitation other than above   All other system neg per pt    Objective:   Physical Exam BP 138/78   Pulse 66   Temp 98.8 F (37.1 C) (Oral)   Resp 20   Wt (!) 302 lb (137 kg)   SpO2 94%   BMI 50.26 kg/m  VS noted, non toxic Constitutional: Pt appears in no apparent distress HENT: Head: NCAT.  Right Ear: External ear normal.  Left Ear: External ear normal.  Eyes: . Pupils are equal, round, and reactive to light. Conjunctivae and EOM are normal Neck: Normal range of motion. Neck supple.  Cardiovascular: Normal rate and regular rhythm.   Pulmonary/Chest: Effort normal and breath sounds decreased without rales but with mild few bilat scattered wheezing.  Abd:  Soft, NT, ND, + BS, no flank tender Neurological: Pt is alert. Not confused , motor grossly intact Skin: Skin is warm. No rash, no LE edema Psychiatric: Pt  behavior is normal. No agitation.    15:26 65moago 122mogo      Color, UA  cloudy      Clarity, UA  yellow      Glucose, UA  negative      Bilirubin, UA  negative      Ketones, UA  negative      Spec Grav, UA  1.020      Blood, UA  negative      pH, UA  6.5      Protein, UA  negative      Urobilinogen, UA  negative  0.2  0.2    Nitrite, UA  negative      Leukocytes, UA Negative Negative  SMALL   MODERATE    Resulting Agency            Assessment & Plan:

## 2016-04-26 NOTE — Assessment & Plan Note (Signed)
Etiology unclear, Udip neg, but cant r/o uti, for empiric antbx, f/u urine cx

## 2016-04-30 ENCOUNTER — Other Ambulatory Visit: Payer: Self-pay | Admitting: Internal Medicine

## 2016-05-13 ENCOUNTER — Ambulatory Visit (INDEPENDENT_AMBULATORY_CARE_PROVIDER_SITE_OTHER): Payer: Medicare Other | Admitting: Ophthalmology

## 2016-06-10 ENCOUNTER — Other Ambulatory Visit: Payer: Self-pay | Admitting: Internal Medicine

## 2016-06-17 DIAGNOSIS — M1711 Unilateral primary osteoarthritis, right knee: Secondary | ICD-10-CM | POA: Diagnosis not present

## 2016-06-17 DIAGNOSIS — M1712 Unilateral primary osteoarthritis, left knee: Secondary | ICD-10-CM | POA: Diagnosis not present

## 2016-06-19 ENCOUNTER — Telehealth: Payer: Self-pay | Admitting: Oncology

## 2016-06-19 NOTE — Telephone Encounter (Signed)
Pt called to r/s 2/15 to 2/22 due to husband work schedule. Gave pt new appt date/time per request

## 2016-06-20 ENCOUNTER — Ambulatory Visit: Payer: Medicare Other | Admitting: Oncology

## 2016-06-20 ENCOUNTER — Other Ambulatory Visit: Payer: Medicare Other

## 2016-06-27 ENCOUNTER — Ambulatory Visit (HOSPITAL_BASED_OUTPATIENT_CLINIC_OR_DEPARTMENT_OTHER): Payer: Medicare Other | Admitting: Oncology

## 2016-06-27 ENCOUNTER — Other Ambulatory Visit (HOSPITAL_BASED_OUTPATIENT_CLINIC_OR_DEPARTMENT_OTHER): Payer: Medicare Other

## 2016-06-27 VITALS — BP 116/53 | HR 59 | Temp 97.7°F | Resp 18 | Ht 65.0 in | Wt 312.1 lb

## 2016-06-27 DIAGNOSIS — Z853 Personal history of malignant neoplasm of breast: Secondary | ICD-10-CM | POA: Diagnosis not present

## 2016-06-27 DIAGNOSIS — C50412 Malignant neoplasm of upper-outer quadrant of left female breast: Secondary | ICD-10-CM

## 2016-06-27 DIAGNOSIS — C50419 Malignant neoplasm of upper-outer quadrant of unspecified female breast: Secondary | ICD-10-CM

## 2016-06-27 DIAGNOSIS — Z170 Estrogen receptor positive status [ER+]: Principal | ICD-10-CM

## 2016-06-27 DIAGNOSIS — N6459 Other signs and symptoms in breast: Secondary | ICD-10-CM

## 2016-06-27 LAB — COMPREHENSIVE METABOLIC PANEL
ALT: 14 U/L (ref 0–55)
AST: 15 U/L (ref 5–34)
Albumin: 3.6 g/dL (ref 3.5–5.0)
Alkaline Phosphatase: 65 U/L (ref 40–150)
Anion Gap: 9 mEq/L (ref 3–11)
BUN: 17.3 mg/dL (ref 7.0–26.0)
CO2: 28 mEq/L (ref 22–29)
Calcium: 9.7 mg/dL (ref 8.4–10.4)
Chloride: 106 mEq/L (ref 98–109)
Creatinine: 0.8 mg/dL (ref 0.6–1.1)
EGFR: 66 mL/min/{1.73_m2} — ABNORMAL LOW (ref >90)
Glucose: 109 mg/dl (ref 70–140)
Potassium: 4 mEq/L (ref 3.5–5.1)
Sodium: 143 mEq/L (ref 136–145)
Total Bilirubin: 1.32 mg/dL — ABNORMAL HIGH (ref 0.20–1.20)
Total Protein: 6.7 g/dL (ref 6.4–8.3)

## 2016-06-27 LAB — CBC WITH DIFFERENTIAL/PLATELET
BASO%: 0.8 % (ref 0.0–2.0)
BASOS ABS: 0.1 10*3/uL (ref 0.0–0.1)
EOS ABS: 0.3 10*3/uL (ref 0.0–0.5)
EOS%: 4.2 % (ref 0.0–7.0)
HCT: 40.6 % (ref 34.8–46.6)
HGB: 13.6 g/dL (ref 11.6–15.9)
LYMPH#: 1.5 10*3/uL (ref 0.9–3.3)
LYMPH%: 19.3 % (ref 14.0–49.7)
MCH: 32.7 pg (ref 25.1–34.0)
MCHC: 33.4 g/dL (ref 31.5–36.0)
MCV: 98.1 fL (ref 79.5–101.0)
MONO#: 0.8 10*3/uL (ref 0.1–0.9)
MONO%: 10.7 % (ref 0.0–14.0)
NEUT%: 65 % (ref 38.4–76.8)
NEUTROS ABS: 5 10*3/uL (ref 1.5–6.5)
Platelets: 146 10*3/uL (ref 145–400)
RBC: 4.14 10*6/uL (ref 3.70–5.45)
RDW: 15 % — AB (ref 11.2–14.5)
WBC: 7.7 10*3/uL (ref 3.9–10.3)

## 2016-06-27 NOTE — Progress Notes (Signed)
ID: [name]   DOB: 1935-07-04  MR#: [id number]  CSN#:[id number]  PCP: Mercedes Cower, MD GYN: [name]/[name] OTHER MD: [name], [name], [name], [name]  CHIEF COMPLAINT:  Left Breast Cancer  CURRENT TREATMENT: Completing 5 years of letrozole  BREAST CANCER HISTORY: From the original intake note:  [name] had an unremarkable screening mammogram August of 2011.  On 12/18/2010 however a potential abnormality was noted in the left breast and she was recalled for additional views 08/16.  Dr. Marcelo [name] was able to demonstrate a focal irregular density in the upper outer quadrant of the left breast associated with linear microcalcifications.  By ultrasound a 9 mm hypoechoic mass was noted at the corresponding area.  This warranted biopsy and a biopsy was performed under ultrasound guidance 08/21.  The pathology showed ([id number]) an invasive lobular carcinoma, E cadherin negative, associated with ductal carcinoma in situ.  The invasive lobular carcinoma was HER-2 negative with a ratio by CISH of 1.23.  Estrogen receptor was 100% positive, progesterone receptor was 100% positive and the proliferation marker was 14%.  With this information the patient was referred to Dr. Margot [name] and bilateral breast MRIs were obtained 12/28/2010.  In the upper outer quadrant of the left breast there was a small irregular nodule measuring 1.2 cm maximally.  There were no other suspicious abnormalities in the left breast or in the right breast.  There were no suspicious internal mammary or axillary lymph nodes identified.  With this information the patient is referred for further evaluation and treatment.  Further treatment is as detailed below.  INTERVAL HISTORY: [name] returns today for follow-up of her estrogen receptor positive breast cancer, accompanied by her husband. She continues on letrozole, with excellent tolerance.  Hot flashes and vaginal dryness are not a  major issue. She never developed the arthralgias or myalgias that many patients can experience on this medication. She obtains it at a good price.  REVIEW OF SYSTEMS [name] is enjoying watching the Olympics, and she spends a great deal of time doing sudoku and crossword puzzles to keep her mind alert. She does not otherwise exercise. She is concern about a slight change in the lateral aspect of her left breast that she wanted me to look at. A detailed review of systems today was otherwise entirely stable  PAST MEDICAL HISTORY: Past Medical History:  Diagnosis Date  . Allergy   . Anxiety   . Arthritis    no cartlidge in knees  . Asthma   . Asthma 03/24/2015  . Atrial fibrillation ([place]) 01/21/11  . Breast cancer ([place]) Receptor + her2 _ 12/26/2010   left  . COPD (chronic obstructive pulmonary disease) ([place])   . Essential hypertension 03/24/2015  . Gout    post operative  . Gout 03/24/2015  . Hx of radiation therapy 04/02/11 -05/20/11   left breast  . Hypothyroidism 03/24/2015  . Incontinence of urine   . Malignant neoplasm of upper-outer quadrant of female breast ([place]) 12/26/2010  . OSA (obstructive sleep apnea) 03/24/2015  . Osteoporosis 03/24/2015  . Peripheral neuropathy ([place]) 03/24/2015  . Plantar fasciitis   . Sleep apnea   The past medical history is significant for hypertension, asthma, hypothyroidism, osteoarthritis/degenerative disk disease with particular problems with her right knee, history of depression, history of paresthesias, hypercholesterolemia, allergic rhinitis, osteopenia, status post tonsillectomy, status post cholecystectomy, status post right hand surgery, history of fractures to the arms and legs, history of coronary artery disease  status post stenting of her right coronary with a good cath report in 2001.  She is status post D and C and cryosurgery.  PAST SURGICAL HISTORY: Past Surgical History:  Procedure Laterality Date  . BREAST LUMPECTOMY W/ NEEDLE  LOCALIZATION  01/29/2011   Left with SL[facility] Dr [name]  . CHOLECYSTECTOMY  1955  . CORONARY ANGIOPLASTY WITH STENT PLACEMENT  2000  . DILATION AND CURETTAGE OF UTERUS  1976   and 1996  . HAND SURGERY  1992   tendons between thumb and forefinger  . SKIN BIOPSY     1994, 2006, 2008, 2010, 2011, 2011, 2012-  pre-cancerous  . TONSILLECTOMY  1955    FAMILY HISTORY Family History  Problem Relation Age of Onset  . Heart disease Father   . Cancer Father     intestinal polyps  . Asthma Sister   . Heart disease Brother   . Heart attack Father   . Heart attack Brother   The patient's father had surgery for cancerous polyps in the colon at age 60.  He died at age 40 from a heart attack.  The patient's mother died at age 33.  The patient has 1 brother alive at age 35 and in good health.  She has 1 sister also in good health.  There is no family history of breast or ovarian cancer.  GYNECOLOGIC HISTORY: She had menarche at age 76, last period in 25.  She took hormone replacement for less than a year, stopping in 1996.  She carried 3 children to term, the first one at age 43.  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  She is also an active Nurse, adult.  She is now retired.  Her husband, [name], is present today as are her daughters, [name] and [name].  [name] is 31, lives here in [place] and is a wound treatment specialist.  [name] also lives in [place] and is an Optometrist.  Son, [name], died at the age of 14 from what seems to have been a heart attack.  The patient has 2 grandchildren.   ADVANCED DIRECTIVES: In place  HEALTH MAINTENANCE: (Updated 02/18/2013) Social History  Substance Use Topics  . Smoking status: Former Smoker    Packs/day: 1.00    Quit date: 12/05/1962  . Smokeless tobacco: Never Used  . Alcohol use No     Colonoscopy:  PAP:  Bone density: Not on file  Lipid panel:  Sept 2014, [name]    Allergies  Allergen Reactions  . Crestor [Rosuvastatin Calcium]     Severe liver function problems  . Lipitor [Atorvastatin Calcium] Other (See Comments)    Elevated liver function   . Vicodin [Hydrocodone-Acetaminophen] Itching    Itching all over  . Codeine Nausea Only and Anxiety    Also complained of dizziness.  Has same problems with oxycodone and hydrocodone  . Oxycodone-Acetaminophen Nausea Only, Anxiety and Other (See Comments)    Visual Disturbance, Balance Difficulty, Dizzy "Room Spinning; "Swimming"  . Quinine Derivatives Nausea Only    dizzy  . Sulfa Antibiotics Nausea Only    Also complained of dizziness  . Keflex [Cephalexin]   . Epinephrine Other (See Comments)    Patient becomes "shaky"  . Penicillins Rash    Pt reported all over body rash in 1958 Has patient had a PCN reaction causing immediate rash, facial/tongue/throat swelling, SOB or lightheadedness with hypotension:Yes Has patient  had a PCN reaction causing severe rash involving mucus membranes or skin necrosis: No Has patient had a PCN reaction that required hospitalization: No Has patient had a PCN reaction occurring within the last 10 years:No    . Theophyllines Other (See Comments)    Patient becomes "shaky"    Current Outpatient Prescriptions  Medication Sig Dispense Refill  . albuterol (PROVENTIL HFA;VENTOLIN HFA) 108 (90 Base) MCG/ACT inhaler Inhale 2 puffs into the lungs as needed for wheezing or shortness of breath. 18 g 3  . albuterol (PROVENTIL) (2.5 MG/3ML) 0.083% nebulizer solution Take 3 mLs (2.5 mg total) by nebulization every 6 (six) hours as needed for wheezing or shortness of breath. 75 mL 12  . alendronate (FOSAMAX) 70 MG tablet take 1 tablet by mouth every week 4 tablet 3  . allopurinol (ZYLOPRIM) 100 MG tablet Take 300 mg by mouth daily.     Marland Kitchen ALPRAZolam (XANAX) 0.5 MG tablet take 1 to 2 tablets by mouth twice a day if needed for anxiety or sleep 60 tablet  3  . atenolol (TENORMIN) 25 MG tablet take 1 tablet by mouth once daily 90 tablet 3  . Biotin 5000 MCG CAPS Take 5,000 mcg by mouth daily.     Marland Kitchen buPROPion (WELLBUTRIN XL) 150 MG 24 hr tablet Take 1 tablet (150 mg total) by mouth daily. 90 tablet 3  . cholecalciferol (VITAMIN D) 1000 UNITS tablet Take 1,000 Units by mouth daily.    Marland Kitchen co-enzyme Q-10 50 MG capsule Take 400 mg by mouth daily.    . colchicine (COLCRYS) 0.6 MG tablet Take 0.6 mg by mouth daily as needed (flare  up).     . escitalopram (LEXAPRO) 10 MG tablet take 1 tablet by mouth once daily 30 tablet 3  . ezetimibe (ZETIA) 10 MG tablet take 1/2 to 1 tablet by mouth daily 90 tablet 3  . fexofenadine (ALLEGRA) 180 MG tablet Take 1 tablet (180 mg total) by mouth daily. 30 tablet 3  . Fluticasone-Salmeterol (ADVAIR) 500-50 MCG/DOSE AEPB Inhale 1 puff into the lungs every 12 (twelve) hours. 60 each 3  . gabapentin (NEURONTIN) 100 MG capsule Take 100 mg by mouth daily.  0  . hydrochlorothiazide (HYDRODIURIL) 25 MG tablet Take 2 tablets (50 mg total) by mouth daily. 180 tablet 3  . letrozole (FEMARA) 2.5 MG tablet Take 1 tablet (2.5 mg total) by mouth daily. 90 tablet 4  . levocetirizine (XYZAL) 5 MG tablet take 1 tablet by mouth at bedtime 30 tablet 3  . montelukast (SINGULAIR) 10 MG tablet take 1 tablet by mouth at bedtime 30 tablet 3  . nitrofurantoin, macrocrystal-monohydrate, (MACROBID) 100 MG capsule Take 1 capsule (100 mg total) by mouth 2 (two) times daily. 20 capsule 0  . nystatin cream (MYCOSTATIN) Apply 1 application topically daily as needed for dry skin.   0  . potassium chloride SA (K-DUR,KLOR-CON) 20 MEQ tablet take 1 tablet by mouth once daily 30 tablet 3  . predniSONE (DELTASONE) 10 MG tablet 3 tabs by mouth per day for 3 days,2tabs per day for 3 days,1tab per day for 3 days 18 tablet 0  . rosuvastatin (CRESTOR) 10 MG tablet take 1 tablet by mouth 2 TIMESA A WEEK 10 tablet 9  . SYNTHROID 50 MCG tablet take 1 tablet by mouth  once daily 30 tablet 3  . traMADol (ULTRAM) 50 MG tablet Take 50 mg by mouth every 12 (twelve) hours as needed for moderate pain.     . traMADol-acetaminophen (  ULTRACET) 37.5-325 MG tablet Take 1-2 tablets by mouth every 6 (six) hours as needed for moderate pain or severe pain.   0  . triamcinolone (NASACORT AQ) 55 MCG/ACT AERO nasal inhaler Place 2 sprays into the nose daily. 1 Inhaler 12  . vitamin C (ASCORBIC ACID) 500 MG tablet Take 500 mg by mouth daily.    Alveda Reasons 20 MG TABS tablet take 1 tablet by mouth once daily 90 tablet 2   No current facility-administered medications for this visit.     OBJECTIVE: Morbidly obese white woman examined in a wheelchair Vitals:   06/27/16 0853  BP: (!) 116/53  Pulse: (!) 59  Resp: 18  Temp: 97.7 F (36.5 C)     Body mass index is 51.94 kg/m.    ECOG FS: 2 Filed Weights   06/27/16 0853  Weight: (!) 312 lb 1.6 oz (141.6 kg)   Sclerae unicteric, EOMs intact Oropharynx clear and moist No cervical or supraclavicular adenopathy Lungs no rales or rhonchi Heart regular rate and rhythm Abd soft, obese, nontender, positive bowel sounds MSK no focal spinal tenderness, no upper extremity lymphedema Neuro: nonfocal, well oriented, appropriate affect Breasts: The right breast is unremarkable. The left breast has undergone lumpectomy and radiation. In the lateral aspect of the breast near one of the radiation tattoos there is a half a centimeter subcutaneous area of increased firmness which I think is  fat necrosis. Both axillae are benign.   LAB RESULTS: Lab Results  Component Value Date   WBC 7.7 06/27/2016   NEUTROABS 5.0 06/27/2016   HGB 13.6 06/27/2016   HCT 40.6 06/27/2016   MCV 98.1 06/27/2016   PLT 146 06/27/2016      Chemistry      Component Value Date/Time   NA 142 10/12/2015 1311   K 3.6 10/12/2015 1311   CL 105 06/30/2015 1134   CL 105 07/15/2012 1409   CO2 26 10/12/2015 1311   BUN 22.3 10/12/2015 1311   CREATININE 0.9  10/12/2015 1311      Component Value Date/Time   CALCIUM 9.5 10/12/2015 1311   ALKPHOS 59 11/21/2015 1036   ALKPHOS 56 10/12/2015 1311   AST 20 11/21/2015 1036   AST 20 10/12/2015 1311   ALT 16 11/21/2015 1036   ALT 18 10/12/2015 1311   BILITOT 1.5 (H) 11/21/2015 1036   BILITOT 0.85 10/12/2015 1311       Lab Results  Component Value Date   LABCA2 17 07/15/2012   STUDIES: No results found.  ASSESSMENT: 81 y.o.  [place] woman   (1)  status post left lumpectomy and sentinel lymph node sampling 01/29/2011 for a T1BN0 (stage I) invasive lobular breast cancer, grade 2, estrogen receptor 100% positive, progesterone receptor 100% positive, with no HER-2 amplification, and then MIB-1 of 14%;   (2)  s/p radiation completed January 2013,   (3)  on letrozole as of February 2013, completing 5 years in February 2018   (4)  mild hypokalemia   (5)  possible lymphadenopathy in the left posterior cervical chain--no significant finding on [place] October 2014  (6) morbid obesity  (7) sleep apnea   PLAN:  Aashvi is now 5-1/2 years out from definitive surgery for her breast cancer with no evidence of disease recurrence. This is very favorable.  She has a slight irregularity in the lateral aspect of the left breast which I think is going to proved to be fat necrosis. I am adding a left breast ultrasound to her next  set of mammograms which I believe will be April at [facility].  She has completed 5 years of anti-estrogens. Whereas in patients at high risk, for example with positive lymph nodes, we frequently recommend continuing an additional 2 years of letrozole, in cases like hers the benefit of continuing is minimal if any. Accordingly I am comfortable with her stopping her anti-estrogens and I am releasing her to her primary care physician at this point.  We did discuss our survivorship program and she expressed some interest. She will let me know if she would like me to make an appointment for  her.  As far as breast cancer follow-up is concerned all she needs is yearly mammography and a yearly physician breast exam.  Of course I will be glad to see Fawn at any point in the future if on when the need arises, but as of now we are making no further routine appointment for her. Chauncey Cruel, MD   06/27/2016

## 2016-07-04 ENCOUNTER — Encounter: Payer: Self-pay | Admitting: Oncology

## 2016-07-04 DIAGNOSIS — Z853 Personal history of malignant neoplasm of breast: Secondary | ICD-10-CM | POA: Diagnosis not present

## 2016-07-04 DIAGNOSIS — N6002 Solitary cyst of left breast: Secondary | ICD-10-CM | POA: Diagnosis not present

## 2016-07-04 LAB — HM MAMMOGRAPHY

## 2016-07-06 ENCOUNTER — Other Ambulatory Visit: Payer: Self-pay | Admitting: Internal Medicine

## 2016-07-09 ENCOUNTER — Encounter: Payer: Self-pay | Admitting: *Deleted

## 2016-07-09 DIAGNOSIS — D3131 Benign neoplasm of right choroid: Secondary | ICD-10-CM | POA: Diagnosis not present

## 2016-07-17 ENCOUNTER — Telehealth: Payer: Self-pay | Admitting: Interventional Cardiology

## 2016-07-17 NOTE — Telephone Encounter (Signed)
New Message   Per pt she was taking a self injectable medication for her cholesterol. She said the medication is so expensive and that she qualified for a program that will give her the medication free. Per pt calling back to reapply. Requesting call from nurse.

## 2016-07-17 NOTE — Telephone Encounter (Signed)
Patient states that she uses Repatha to control her cholesterol. She states that the medication is expensive and she has qualified for the program in the past that allowed her to get the medication free. She would like to reapply. Message routed to [facility].

## 2016-07-18 ENCOUNTER — Ambulatory Visit (INDEPENDENT_AMBULATORY_CARE_PROVIDER_SITE_OTHER): Payer: Medicare Other | Admitting: Internal Medicine

## 2016-07-18 ENCOUNTER — Ambulatory Visit (INDEPENDENT_AMBULATORY_CARE_PROVIDER_SITE_OTHER)
Admission: RE | Admit: 2016-07-18 | Discharge: 2016-07-18 | Disposition: A | Discharge status: Home / Self Care | Payer: Medicare Other | Source: Ambulatory Visit | Attending: Internal Medicine | Admitting: Internal Medicine

## 2016-07-18 ENCOUNTER — Encounter: Payer: Self-pay | Admitting: Internal Medicine

## 2016-07-18 VITALS — BP 122/86 | HR 78 | Temp 97.6°F | Ht 65.0 in | Wt 320.0 lb

## 2016-07-18 DIAGNOSIS — I10 Essential (primary) hypertension: Secondary | ICD-10-CM

## 2016-07-18 DIAGNOSIS — J45901 Unspecified asthma with (acute) exacerbation: Secondary | ICD-10-CM | POA: Insufficient documentation

## 2016-07-18 DIAGNOSIS — R05 Cough: Secondary | ICD-10-CM | POA: Diagnosis not present

## 2016-07-18 DIAGNOSIS — T7840XA Allergy, unspecified, initial encounter: Secondary | ICD-10-CM | POA: Diagnosis not present

## 2016-07-18 DIAGNOSIS — R079 Chest pain, unspecified: Secondary | ICD-10-CM | POA: Diagnosis not present

## 2016-07-18 MED ORDER — PREDNISONE 10 MG PO TABS: ORAL_TABLET | ORAL | 0 refills | Status: DC

## 2016-07-18 MED ORDER — METHYLPREDNISOLONE ACETATE 80 MG/ML IJ SUSP
80.0000 mg | Freq: Once | INTRAMUSCULAR | Status: AC
  Administered 2016-07-18: 80 mg via INTRAMUSCULAR

## 2016-07-18 NOTE — Telephone Encounter (Signed)
Spoke to patient and application has been completed and faxed to [employer] for renewal. Patient aware to call if she does not hear from company within 2 weeks. She states appreciation and understanding.

## 2016-07-18 NOTE — Assessment & Plan Note (Signed)
stable overall by history and exam, recent data reviewed with pt, and pt to continue medical treatment as before,  to f/u any worsening symptoms or concerns BP Readings from Last 3 Encounters:  07/18/16 122/86  06/27/16 (!) 116/53  04/26/16 138/78

## 2016-07-18 NOTE — Assessment & Plan Note (Signed)
Atypical, suspect MSK, but will check cxr r/o pna

## 2016-07-18 NOTE — Assessment & Plan Note (Signed)
Mild to mod flare, for steorid tx as documented today, consider allergy referral, to f/u any worsening symptoms or concerns

## 2016-07-18 NOTE — Progress Notes (Signed)
Subjective:    Patient ID: [name], female    DOB: 1936/01/24, 81 y.o.   MRN: [id number]  HPI  Here to f/u with c/o 1 wk onset mild to mod flare or allergy and asthma symptoms with wheezing, non prod cough, sob, and Does have several wks ongoing nasal allergy symptoms with clearish congestion, itch and sneezing, without fever, pain, ST, swelling  This seems to occur every spring and fall despite her daily zyrtec and allegra use (which she does per dermatology recommendations).  Has also developed right lower costal margin CP sharp pleuritic midl to mod intermittent with radiation to the right side, worse with position change such as turning over in bed, seems to catch then at times is resolved but keeps recurring several times per day.   Past Medical History:  Diagnosis Date  . Allergy   . Anxiety   . Arthritis    no cartlidge in knees  . Asthma   . Asthma 03/24/2015  . Atrial fibrillation ([place]) 01/21/11  . 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 COPD (chronic obstructive pulmonary disease) ([place])   . Essential hypertension 03/24/2015  . Gout    post operative  . Gout 03/24/2015  . Hx of radiation therapy 04/02/11 -05/20/11   left breast  . Hypothyroidism 03/24/2015  . Incontinence of urine   . Malignant neoplasm of upper-outer quadrant of female breast ([place]) 12/26/2010  . OSA (obstructive sleep apnea) 03/24/2015  . Osteoporosis 03/24/2015  . Peripheral neuropathy ([place]) 03/24/2015  . Plantar fasciitis   . Sleep apnea    Past Surgical History:  Procedure Laterality Date  . BREAST LUMPECTOMY W/ NEEDLE LOCALIZATION  01/29/2011   Left with [facility] Dr Margot Chimes  . CHOLECYSTECTOMY  1955  . CORONARY ANGIOPLASTY WITH STENT PLACEMENT  2000  . DILATION AND CURETTAGE OF UTERUS  1976   and 1996  . HAND SURGERY  1992   tendons between thumb and forefinger  . SKIN BIOPSY     1994, 2006, 2008, 2010, 2011, 2011, 2012-  pre-cancerous  . TONSILLECTOMY  1955    reports that  she quit smoking about 53 years ago. She smoked 1.00 pack per day. She has never used smokeless tobacco. She reports that she does not drink alcohol or use drugs. family history includes Asthma in her sister; Cancer in her father; Heart attack in her brother and father; Heart disease in her brother and father. Allergies  Allergen Reactions  . Crestor [Rosuvastatin Calcium]     Severe liver function problems  . Lipitor [Atorvastatin Calcium] Other (See Comments)    Elevated liver function   . Vicodin [Hydrocodone-Acetaminophen] Itching    Itching all over  . Codeine Nausea Only and Anxiety    Also complained of dizziness.  Has same problems with oxycodone and hydrocodone  . Oxycodone-Acetaminophen Nausea Only, Anxiety and Other (See Comments)    Visual Disturbance, Balance Difficulty, Dizzy "Room Spinning; "Swimming"  . Quinine Derivatives Nausea Only    dizzy  . Sulfa Antibiotics Nausea Only    Also complained of dizziness  . Keflex [Cephalexin]   . Epinephrine Other (See Comments)    Patient becomes "shaky"  . Penicillins Rash    Pt reported all over body rash in 1958 Has patient had a PCN reaction causing immediate rash, facial/tongue/throat swelling, SOB or lightheadedness with hypotension:Yes Has patient had a PCN reaction causing severe rash involving mucus membranes or skin necrosis: No Has patient had  a PCN reaction that required hospitalization: No Has patient had a PCN reaction occurring within the last 10 years:No    . Theophyllines Other (See Comments)    Patient becomes "shaky"   Current Outpatient Prescriptions on File Prior to Visit  Medication Sig Dispense Refill  . albuterol (PROVENTIL HFA;VENTOLIN HFA) 108 (90 Base) MCG/ACT inhaler Inhale 2 puffs into the lungs as needed for wheezing or shortness of breath. 18 g 3  . albuterol (PROVENTIL) (2.5 MG/3ML) 0.083% nebulizer solution Take 3 mLs (2.5 mg total) by nebulization every 6 (six) hours as needed for wheezing or  shortness of breath. 75 mL 12  . alendronate (FOSAMAX) 70 MG tablet take 1 tablet by mouth every week 4 tablet 3  . allopurinol (ZYLOPRIM) 100 MG tablet Take 300 mg by mouth daily.     Marland Kitchen ALPRAZolam (XANAX) 0.5 MG tablet take 1 to 2 tablets by mouth twice a day if needed for anxiety or sleep 60 tablet 3  . atenolol (TENORMIN) 25 MG tablet take 1 tablet by mouth once daily 90 tablet 3  . Biotin 5000 MCG CAPS Take 5,000 mcg by mouth daily.     Marland Kitchen buPROPion (WELLBUTRIN XL) 150 MG 24 hr tablet Take 1 tablet (150 mg total) by mouth daily. 90 tablet 3  . cholecalciferol (VITAMIN D) 1000 UNITS tablet Take 1,000 Units by mouth daily.    Marland Kitchen co-enzyme Q-10 50 MG capsule Take 400 mg by mouth daily.    . colchicine (COLCRYS) 0.6 MG tablet Take 0.6 mg by mouth daily as needed (flare  up).     . escitalopram (LEXAPRO) 10 MG tablet take 1 tablet by mouth once daily 30 tablet 5  . ezetimibe (ZETIA) 10 MG tablet take 1/2 to 1 tablet by mouth daily 90 tablet 3  . fexofenadine (ALLEGRA) 180 MG tablet Take 1 tablet (180 mg total) by mouth daily. 30 tablet 3  . Fluticasone-Salmeterol (ADVAIR) 500-50 MCG/DOSE AEPB Inhale 1 puff into the lungs every 12 (twelve) hours. 60 each 3  . gabapentin (NEURONTIN) 100 MG capsule Take 100 mg by mouth daily.  0  . hydrochlorothiazide (HYDRODIURIL) 25 MG tablet Take 2 tablets (50 mg total) by mouth daily. 180 tablet 3  . letrozole (FEMARA) 2.5 MG tablet Take 1 tablet (2.5 mg total) by mouth daily. 90 tablet 4  . levocetirizine (XYZAL) 5 MG tablet take 1 tablet by mouth at bedtime 30 tablet 5  . montelukast (SINGULAIR) 10 MG tablet take 1 tablet by mouth at bedtime 30 tablet 3  . nitrofurantoin, macrocrystal-monohydrate, (MACROBID) 100 MG capsule Take 1 capsule (100 mg total) by mouth 2 (two) times daily. 20 capsule 0  . nystatin cream (MYCOSTATIN) Apply 1 application topically daily as needed for dry skin.   0  . potassium chloride SA (K-DUR,KLOR-CON) 20 MEQ tablet take 1 tablet by  mouth once daily 30 tablet 3  . rosuvastatin (CRESTOR) 10 MG tablet take 1 tablet by mouth 2 TIMESA A WEEK 10 tablet 9  . SYNTHROID 50 MCG tablet take 1 tablet by mouth once daily 30 tablet 3  . traMADol (ULTRAM) 50 MG tablet Take 50 mg by mouth every 12 (twelve) hours as needed for moderate pain.     . traMADol-acetaminophen (ULTRACET) 37.5-325 MG tablet Take 1-2 tablets by mouth every 6 (six) hours as needed for moderate pain or severe pain.   0  . triamcinolone (NASACORT AQ) 55 MCG/ACT AERO nasal inhaler Place 2 sprays into the nose daily. 1  Inhaler 12  . vitamin C (ASCORBIC ACID) 500 MG tablet Take 500 mg by mouth daily.    Alveda Reasons 20 MG TABS tablet take 1 tablet by mouth once daily 90 tablet 2   No current facility-administered medications on file prior to visit.    Review of Systems  Constitutional: Negative for unusual diaphoresis or night sweats HENT: Negative for ear swelling or discharge Eyes: Negative for worsening visual haziness  Respiratory: Negative for choking and stridor.   Gastrointestinal: Negative for distension or worsening eructation Genitourinary: Negative for retention or change in urine volume.  Musculoskeletal: Negative for other MSK pain or swelling Skin: Negative for color change and worsening wound Neurological: Negative for tremors and numbness other than noted  Psychiatric/Behavioral: Negative for decreased concentration or agitation other than above   All other system neg per pt    Objective:   Physical Exam BP 122/86   Pulse 78   Temp 97.6 F (36.4 C)   Ht '5\' 5"'$  (1.651 m)   Wt (!) 320 lb (145.2 kg)   SpO2 98%   BMI 53.25 kg/m  VS noted,  Constitutional: Pt appears in no apparent distress HENT: Head: NCAT.  Right Ear: External ear normal.  Left Ear: External ear normal.  Eyes: . Pupils are equal, round, and reactive to light. Conjunctivae and EOM are normal Bilat tm's with mild erythema.  Max sinus areas non tender.  Pharynx with mild  erythema, no exudateNeck: Normal range of motion. Neck supple.  Cardiovascular: Normal rate and regular rhythm.   Pulmonary/Chest: Effort normal and breath sounds decreased without ralesb but with few scattered insp wheezing as well as mild to mod exp wheezing.  Abd:  Soft, NT, ND, + BS Neurological: Pt is alert. Not confused , motor grossly intact Skin: Skin is warm. No rash, no LE edema Psychiatric: Pt behavior is normal. No agitation.  No other exam findings  Lab Results  Component Value Date   WBC 7.7 06/27/2016   HGB 13.6 06/27/2016   HCT 40.6 06/27/2016   PLT 146 06/27/2016   GLUCOSE 109 06/27/2016   CHOL 193 11/21/2015   TRIG 105 11/21/2015   HDL 62 11/21/2015   LDLCALC 110 11/21/2015   ALT 14 06/27/2016   AST 15 06/27/2016   NA 143 06/27/2016   K 4.0 06/27/2016   CL 105 06/30/2015   CREATININE 0.8 06/27/2016   BUN 17.3 06/27/2016   CO2 28 06/27/2016   TSH 2.17 06/30/2015       Assessment & Plan:

## 2016-07-18 NOTE — Assessment & Plan Note (Signed)
Mild to mod, no s/s of infection, likely allergy related, for depomedrol IM 80 qd, predpac asd,  to f/u any worsening symptoms or concerns, cont nebs and inhalers

## 2016-07-18 NOTE — Patient Instructions (Signed)
You had the steroid shot today  Please take all new medication as prescribed - the prednisone  Please continue all other medications as before, including the zyrtec, allegra and inhalers  Please have the pharmacy call with any other refills you may need.  Please keep your appointments with your specialists as you may have planned  Please go to the XRAY Department in the Basement (go straight as you get off the elevator) for the x-ray testing  You will be contacted by phone if any changes need to be made immediately.  Otherwise, you will receive a letter about your results with an explanation, but please check with MyChart first.  Please remember to sign up for MyChart if you have not done so, as this will be important to you in the future with finding out test results, communicating by private email, and scheduling acute appointments online when needed.

## 2016-07-24 ENCOUNTER — Telehealth: Payer: Self-pay | Admitting: Pharmacist

## 2016-07-24 NOTE — Telephone Encounter (Signed)
LMOM for pt to return call - need lipid panel on Repatha for reauthorization of coverage.

## 2016-07-26 ENCOUNTER — Other Ambulatory Visit: Payer: Self-pay | Admitting: Internal Medicine

## 2016-07-26 NOTE — Telephone Encounter (Signed)
LM again that will need labs

## 2016-08-02 NOTE — Telephone Encounter (Signed)
Pt states that she took 1 dose of Repatha last year before her prescription expired with [employer]. Had previously tried to resubmit for coverage with [employer] but her prior auth had expired. Will need prior auth as a new start rather than reauthorization since pt never had follow up labs while on drug and she has been off of therapy for the past 3-4 months. Submitting new PA today.

## 2016-08-13 ENCOUNTER — Other Ambulatory Visit: Payer: Self-pay | Admitting: Interventional Cardiology

## 2016-08-13 NOTE — Telephone Encounter (Signed)
Received a request for Xarelto 20mg ; pt is 81 yrs old, wt-145.2kg on 07/18/16, Crea-0.80 on 06/27/16, CrCl-147.40ml/min, last seen by Dr. Irish Lack on 11/21/15. Will send in requested prescription to requested pharmacy.

## 2016-08-18 ENCOUNTER — Other Ambulatory Visit: Payer: Self-pay | Admitting: Internal Medicine

## 2016-08-26 ENCOUNTER — Other Ambulatory Visit: Payer: Self-pay | Admitting: Internal Medicine

## 2016-08-27 NOTE — Telephone Encounter (Signed)
faxed

## 2016-08-27 NOTE — Telephone Encounter (Signed)
Done hardcopy to [name]  

## 2016-09-10 ENCOUNTER — Other Ambulatory Visit: Payer: Self-pay | Admitting: Internal Medicine

## 2016-09-10 DIAGNOSIS — L814 Other melanin hyperpigmentation: Secondary | ICD-10-CM | POA: Diagnosis not present

## 2016-09-10 DIAGNOSIS — L738 Other specified follicular disorders: Secondary | ICD-10-CM | POA: Diagnosis not present

## 2016-09-10 DIAGNOSIS — D225 Melanocytic nevi of trunk: Secondary | ICD-10-CM | POA: Diagnosis not present

## 2016-09-10 DIAGNOSIS — L570 Actinic keratosis: Secondary | ICD-10-CM | POA: Diagnosis not present

## 2016-09-11 DIAGNOSIS — M1712 Unilateral primary osteoarthritis, left knee: Secondary | ICD-10-CM | POA: Diagnosis not present

## 2016-09-11 DIAGNOSIS — M1711 Unilateral primary osteoarthritis, right knee: Secondary | ICD-10-CM | POA: Diagnosis not present

## 2016-09-17 ENCOUNTER — Ambulatory Visit (INDEPENDENT_AMBULATORY_CARE_PROVIDER_SITE_OTHER): Payer: Medicare Other | Admitting: Internal Medicine

## 2016-09-17 ENCOUNTER — Encounter: Payer: Self-pay | Admitting: Internal Medicine

## 2016-09-17 ENCOUNTER — Other Ambulatory Visit (INDEPENDENT_AMBULATORY_CARE_PROVIDER_SITE_OTHER): Payer: Medicare Other

## 2016-09-17 ENCOUNTER — Ambulatory Visit: Payer: Medicare Other | Admitting: Internal Medicine

## 2016-09-17 ENCOUNTER — Ambulatory Visit (INDEPENDENT_AMBULATORY_CARE_PROVIDER_SITE_OTHER)
Admission: RE | Admit: 2016-09-17 | Discharge: 2016-09-17 | Disposition: A | Discharge status: Home / Self Care | Payer: Medicare Other | Source: Ambulatory Visit | Attending: Internal Medicine | Admitting: Internal Medicine

## 2016-09-17 ENCOUNTER — Other Ambulatory Visit: Payer: Self-pay | Admitting: Internal Medicine

## 2016-09-17 ENCOUNTER — Telehealth: Payer: Self-pay

## 2016-09-17 VITALS — BP 120/76 | HR 60 | Ht 66.0 in | Wt 316.0 lb

## 2016-09-17 DIAGNOSIS — R600 Localized edema: Secondary | ICD-10-CM

## 2016-09-17 DIAGNOSIS — R739 Hyperglycemia, unspecified: Secondary | ICD-10-CM | POA: Diagnosis not present

## 2016-09-17 DIAGNOSIS — R911 Solitary pulmonary nodule: Secondary | ICD-10-CM

## 2016-09-17 DIAGNOSIS — I10 Essential (primary) hypertension: Secondary | ICD-10-CM | POA: Diagnosis not present

## 2016-09-17 DIAGNOSIS — Z0001 Encounter for general adult medical examination with abnormal findings: Secondary | ICD-10-CM

## 2016-09-17 DIAGNOSIS — J45901 Unspecified asthma with (acute) exacerbation: Secondary | ICD-10-CM

## 2016-09-17 DIAGNOSIS — J45909 Unspecified asthma, uncomplicated: Secondary | ICD-10-CM | POA: Diagnosis not present

## 2016-09-17 DIAGNOSIS — T7840XA Allergy, unspecified, initial encounter: Secondary | ICD-10-CM | POA: Diagnosis not present

## 2016-09-17 LAB — BRAIN NATRIURETIC PEPTIDE: PRO B NATRI PEPTIDE: 100 pg/mL (ref 0.0–100.0)

## 2016-09-17 LAB — URINALYSIS, ROUTINE W REFLEX MICROSCOPIC
BILIRUBIN URINE: NEGATIVE
Hgb urine dipstick: NEGATIVE
NITRITE: NEGATIVE
PH: 7 (ref 5.0–8.0)
Specific Gravity, Urine: 1.015 (ref 1.000–1.030)
Total Protein, Urine: NEGATIVE
URINE GLUCOSE: NEGATIVE
UROBILINOGEN UA: 1 (ref 0.0–1.0)

## 2016-09-17 LAB — BASIC METABOLIC PANEL
BUN: 17 mg/dL (ref 6–23)
CALCIUM: 9.9 mg/dL (ref 8.4–10.5)
CHLORIDE: 107 meq/L (ref 96–112)
CO2: 26 mEq/L (ref 19–32)
Creatinine, Ser: 0.82 mg/dL (ref 0.40–1.20)
GFR: 71.46 mL/min (ref >60.00)
Glucose, Bld: 96 mg/dL (ref 70–99)
Potassium: 3.8 mEq/L (ref 3.5–5.1)
SODIUM: 141 meq/L (ref 135–145)

## 2016-09-17 LAB — LIPID PANEL
CHOL/HDL RATIO: 4
Cholesterol: 200 mg/dL (ref 0–200)
HDL: 49.5 mg/dL (ref >39.00)
NONHDL: 150.71
Triglycerides: 242 mg/dL — ABNORMAL HIGH (ref 0.0–149.0)
VLDL: 48.4 mg/dL — ABNORMAL HIGH (ref 0.0–40.0)

## 2016-09-17 LAB — CBC WITH DIFFERENTIAL/PLATELET
BASOS ABS: 0 10*3/uL (ref 0.0–0.1)
Basophils Relative: 0.2 % (ref 0.0–3.0)
Eosinophils Absolute: 0.4 10*3/uL (ref 0.0–0.7)
Eosinophils Relative: 5.1 % — ABNORMAL HIGH (ref 0.0–5.0)
HCT: 41.8 % (ref 36.0–46.0)
HEMOGLOBIN: 13.8 g/dL (ref 12.0–15.0)
Lymphocytes Relative: 23.1 % (ref 12.0–46.0)
Lymphs Abs: 1.8 10*3/uL (ref 0.7–4.0)
MCHC: 33 g/dL (ref 30.0–36.0)
MCV: 97.5 fl (ref 78.0–100.0)
MONO ABS: 0.8 10*3/uL (ref 0.1–1.0)
MONOS PCT: 10.2 % (ref 3.0–12.0)
Neutro Abs: 4.8 10*3/uL (ref 1.4–7.7)
Neutrophils Relative %: 61.4 % (ref 43.0–77.0)
Platelets: 158 10*3/uL (ref 150.0–400.0)
RBC: 4.28 Mil/uL (ref 3.87–5.11)
RDW: 15.3 % (ref 11.5–15.5)
WBC: 7.8 10*3/uL (ref 4.0–10.5)

## 2016-09-17 LAB — HEPATIC FUNCTION PANEL
ALBUMIN: 3.9 g/dL (ref 3.5–5.2)
ALT: 20 U/L (ref 0–35)
AST: 18 U/L (ref 0–37)
Alkaline Phosphatase: 55 U/L (ref 39–117)
Bilirubin, Direct: 0.2 mg/dL (ref 0.0–0.3)
Total Bilirubin: 1.3 mg/dL — ABNORMAL HIGH (ref 0.2–1.2)
Total Protein: 6.7 g/dL (ref 6.0–8.3)

## 2016-09-17 LAB — TSH: TSH: 5.76 u[IU]/mL — AB (ref 0.35–4.50)

## 2016-09-17 LAB — HEMOGLOBIN A1C: Hgb A1c MFr Bld: 6.5 % (ref 4.6–6.5)

## 2016-09-17 LAB — LDL CHOLESTEROL, DIRECT: Direct LDL: 107 mg/dL

## 2016-09-17 MED ORDER — BUDESONIDE-FORMOTEROL FUMARATE 160-4.5 MCG/ACT IN AERO: 2.0000 | INHALATION_SPRAY | Freq: Two times a day (BID) | RESPIRATORY_TRACT | 3 refills | Status: DC

## 2016-09-17 MED ORDER — PREDNISONE 10 MG PO TABS: ORAL_TABLET | ORAL | 0 refills | Status: DC

## 2016-09-17 MED ORDER — ALBUTEROL SULFATE (2.5 MG/3ML) 0.083% IN NEBU: 2.5000 mg | INHALATION_SOLUTION | Freq: Four times a day (QID) | RESPIRATORY_TRACT | 12 refills | Status: DC | PRN

## 2016-09-17 MED ORDER — METHYLPREDNISOLONE ACETATE 80 MG/ML IJ SUSP
80.0000 mg | Freq: Once | INTRAMUSCULAR | Status: AC
  Administered 2016-09-17: 80 mg via INTRAMUSCULAR

## 2016-09-17 MED ORDER — AZELASTINE HCL 0.05 % OP SOLN: 1.0000 [drp] | Freq: Two times a day (BID) | OPHTHALMIC | 12 refills | Status: DC

## 2016-09-17 NOTE — Assessment & Plan Note (Addendum)
With allergic conjunctivitis and rhinitis - Mild to mod, for depomedrol IM 80, predpac asd, optivar asd, to f/u any worsening symptoms or concerns

## 2016-09-17 NOTE — Progress Notes (Addendum)
Subjective:    Patient ID: [name], female    DOB: 1935-07-06, 81 y.o.   MRN: [id number]  HPI  Here for wellness and f/u;  Overall doing ok;  Pt denies Chest pain, worsening orthopnea, PND, palpitations, dizziness or syncope, but has had mild worsening pedal edema.  Pt denies neurological change such as new headache, facial or extremity weakness.  Pt denies polydipsia, polyuria, or low sugar symptoms. Pt states overall good compliance with treatment and medications, good tolerability, and has been trying to follow appropriate diet.  Pt denies worsening depressive symptoms, suicidal ideation or panic. No fever, night sweats, wt loss, loss of appetite, or other constitutional symptoms.  Pt states good ability with ADL's, has low fall risk, home safety reviewed and adequate, no other significant changes in hearing or vision, and not active with exercise.   Does have several wks ongoing nasal allergy symptoms with clearish congestion, itch and sneezing, without fever, pain, ST, cough, swelling or wheezing, until last week with worsenin chest tightness, wheezing, sob much worse last PM, but somewhat better currently. Good compliance with CPAP at night Past Medical History:  Diagnosis Date  . Allergy   . Anxiety   . Arthritis    no cartlidge in knees  . Asthma   . Asthma 03/24/2015  . Atrial fibrillation ([place]) 01/21/11  . 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 COPD (chronic obstructive pulmonary disease) ([place])   . Essential hypertension 03/24/2015  . Gout    post operative  . Gout 03/24/2015  . Hx of radiation therapy 04/02/11 -05/20/11   left breast  . Hypothyroidism 03/24/2015  . Incontinence of urine   . Malignant neoplasm of upper-outer quadrant of female breast ([place]) 12/26/2010  . OSA (obstructive sleep apnea) 03/24/2015  . Osteoporosis 03/24/2015  . Peripheral neuropathy 03/24/2015  . Plantar fasciitis   . Sleep apnea    Past Surgical History:  Procedure  Laterality Date  . BREAST LUMPECTOMY W/ NEEDLE LOCALIZATION  01/29/2011   Left with [facility] Dr Margot Chimes  . CHOLECYSTECTOMY  1955  . CORONARY ANGIOPLASTY WITH STENT PLACEMENT  2000  . DILATION AND CURETTAGE OF UTERUS  1976   and 1996  . HAND SURGERY  1992   tendons between thumb and forefinger  . SKIN BIOPSY     1994, 2006, 2008, 2010, 2011, 2011, 2012-  pre-cancerous  . TONSILLECTOMY  1955    reports that she quit smoking about 53 years ago. She smoked 1.00 pack per day. She has never used smokeless tobacco. She reports that she does not drink alcohol or use drugs. family history includes Asthma in her sister; Cancer in her father; Heart attack in her brother and father; Heart disease in her brother and father. Allergies  Allergen Reactions  . Crestor [Rosuvastatin Calcium]     Severe liver function problems  . Lipitor [Atorvastatin Calcium] Other (See Comments)    Elevated liver function   . Vicodin [Hydrocodone-Acetaminophen] Itching    Itching all over  . Codeine Nausea Only and Anxiety    Also complained of dizziness.  Has same problems with oxycodone and hydrocodone  . Oxycodone-Acetaminophen Nausea Only, Anxiety and Other (See Comments)    Visual Disturbance, Balance Difficulty, Dizzy "Room Spinning; "Swimming"  . Quinine Derivatives Nausea Only    dizzy  . Sulfa Antibiotics Nausea Only    Also complained of dizziness  . Keflex [Cephalexin]   . Epinephrine Other (See Comments)  Patient becomes "shaky"  . Penicillins Rash    Pt reported all over body rash in 1958 Has patient had a PCN reaction causing immediate rash, facial/tongue/throat swelling, SOB or lightheadedness with hypotension:Yes Has patient had a PCN reaction causing severe rash involving mucus membranes or skin necrosis: No Has patient had a PCN reaction that required hospitalization: No Has patient had a PCN reaction occurring within the last 10 years:No    . Theophyllines Other (See Comments)    Patient  becomes "shaky"   Current Outpatient Prescriptions on File Prior to Visit  Medication Sig Dispense Refill  . ADVAIR DISKUS 500-50 MCG/DOSE AEPB INHALE 1 PUFF BY MOUTH INTO THE LUNGS EVERY 12 HOURS 60 each 1  . albuterol (PROVENTIL HFA;VENTOLIN HFA) 108 (90 Base) MCG/ACT inhaler Inhale 2 puffs into the lungs as needed for wheezing or shortness of breath. 18 g 3  . alendronate (FOSAMAX) 70 MG tablet take 1 tablet by mouth every week 4 tablet 3  . allopurinol (ZYLOPRIM) 100 MG tablet Take 300 mg by mouth daily.     Marland Kitchen ALPRAZolam (XANAX) 0.5 MG tablet TAKE 1 TO 2 TABLETS BY MOUTH TWICE DAILY IF NEEDED FOR ANXIETY/SLEEP 60 tablet 3  . atenolol (TENORMIN) 25 MG tablet take 1 tablet by mouth once daily 90 tablet 3  . Biotin 5000 MCG CAPS Take 5,000 mcg by mouth daily.     Marland Kitchen buPROPion (WELLBUTRIN XL) 150 MG 24 hr tablet Take 1 tablet (150 mg total) by mouth daily. 90 tablet 3  . cholecalciferol (VITAMIN D) 1000 UNITS tablet Take 1,000 Units by mouth daily.    Marland Kitchen co-enzyme Q-10 50 MG capsule Take 400 mg by mouth daily.    . colchicine (COLCRYS) 0.6 MG tablet Take 0.6 mg by mouth daily as needed (flare  up).     . escitalopram (LEXAPRO) 10 MG tablet take 1 tablet by mouth once daily 30 tablet 5  . ezetimibe (ZETIA) 10 MG tablet take 1/2 to 1 tablet by mouth daily 90 tablet 3  . fexofenadine (ALLEGRA) 180 MG tablet Take 1 tablet (180 mg total) by mouth daily. 30 tablet 3  . gabapentin (NEURONTIN) 100 MG capsule Take 100 mg by mouth daily.  0  . hydrochlorothiazide (HYDRODIURIL) 25 MG tablet Take 2 tablets (50 mg total) by mouth daily. 180 tablet 3  . letrozole (FEMARA) 2.5 MG tablet Take 1 tablet (2.5 mg total) by mouth daily. 90 tablet 4  . levocetirizine (XYZAL) 5 MG tablet take 1 tablet by mouth at bedtime 30 tablet 5  . montelukast (SINGULAIR) 10 MG tablet take 1 tablet by mouth at bedtime 30 tablet 3  . nitrofurantoin, macrocrystal-monohydrate, (MACROBID) 100 MG capsule Take 1 capsule (100 mg total)  by mouth 2 (two) times daily. 20 capsule 0  . nystatin cream (MYCOSTATIN) Apply 1 application topically daily as needed for dry skin.   0  . potassium chloride SA (K-DUR,KLOR-CON) 20 MEQ tablet take 1 tablet by mouth once daily 30 tablet 3  . rosuvastatin (CRESTOR) 10 MG tablet take 1 tablet by mouth 2 TIMESA A WEEK 10 tablet 9  . SYNTHROID 50 MCG tablet take 1 tablet by mouth once daily 30 tablet 3  . traMADol (ULTRAM) 50 MG tablet Take 50 mg by mouth every 12 (twelve) hours as needed for moderate pain.     . traMADol-acetaminophen (ULTRACET) 37.5-325 MG tablet Take 1-2 tablets by mouth every 6 (six) hours as needed for moderate pain or severe pain.  0  . triamcinolone (NASACORT AQ) 55 MCG/ACT AERO nasal inhaler Place 2 sprays into the nose daily. 1 Inhaler 12  . vitamin C (ASCORBIC ACID) 500 MG tablet Take 500 mg by mouth daily.    Alveda Reasons 20 MG TABS tablet take 1 tablet by mouth once daily 90 tablet 1   No current facility-administered medications on file prior to visit.    Review of Systems Constitutional: Negative for other unusual diaphoresis, sweats, appetite or weight changes HENT: Negative for other worsening hearing loss, ear pain, facial swelling, mouth sores or neck stiffness.   Eyes: Negative for other worsening pain, redness or other visual disturbance.  Respiratory: Negative for other stridor or swelling Cardiovascular: Negative for other palpitations or other chest pain  Gastrointestinal: Negative for worsening diarrhea or loose stools, blood in stool, distention or other pain Genitourinary: Negative for hematuria, flank pain or other change in urine volume.  Musculoskeletal: Negative for myalgias or other joint swelling.  Skin: Negative for other color change, or other wound or worsening drainage.  Neurological: Negative for other syncope or numbness. Hematological: Negative for other adenopathy or swelling Psychiatric/Behavioral: Negative for hallucinations, other  worsening agitation, SI, self-injury, or new decreased concentration All other system neg per pt    Objective:   Physical Exam BP 120/76   Pulse 60   Ht '5\' 6"'$  (1.676 m)   Wt (!) 316 lb (143.3 kg)   SpO2 98%   BMI 51.00 kg/m  VS noted, morbid obese, requires 2+ assist to getting up on exam table Constitutional: Pt is oriented to person, place, and time. Appears well-developed and well-nourished, in no significant distress and comfortable, not  Head: Normocephalic and atraumatic  Eyes: Conjunctivae with mild injection, weepy d/c and EOM are normal. Pupils are equal, round, and reactive to light Right Ear: External ear normal without discharge Left Ear: External ear normal without discharge Nose: Nose without discharge or deformity Bilat tm's with mild erythema.  Max sinus areas non tender.  Pharynx with mild erythema, no exudate Mouth/Throat: Oropharynx is without other ulcerations and moist  Neck: Normal range of motion. Neck supple. No JVD present. No tracheal deviation present or significant neck LA or mass Cardiovascular: Normal rate, regular rhythm, normal heart sounds and intact distal pulses.   Pulmonary/Chest: WOB normal and breath sounds decreased without rales but with few scattered insp and exp wheezing  Abdominal: Soft. Bowel sounds are normal. NT. No HSM  Musculoskeletal: Normal range of motion. Exhibits 1+ bilat pedal edema Lymphadenopathy: Has no other cervical adenopathy.  Neurological: Pt is alert and oriented to person, place, and time. Pt has normal reflexes. No cranial nerve deficit. Motor grossly intact, Gait intact Skin: Skin is warm and dry. No rash noted or new ulcerations Psychiatric:  Has normal mood and affect. Behavior is normal without agitation No other exam findings     Assessment & Plan:

## 2016-09-17 NOTE — Assessment & Plan Note (Addendum)
Mild to mod, for depomedrol IM 80, predpac asd, to f/u any worsening symptoms or concerns  In addition to the time spent performing CPE, I spent an additional 15 minutes face to face,in which greater than 50% of this time was spent in counseling and coordination of care for patient's illness as documented, including the differential diagnosis, evaluation and management of asthma, allergies, pedal edema, HTN and hyperglycemia

## 2016-09-17 NOTE — Patient Instructions (Addendum)
You had the steroid shot today  Please take all new medication as prescribed  - the prednisone, symbicort and optivar  Please continue all other medications as before, and refills have been done if requested.  Please have the pharmacy call with any other refills you may need.  Please continue your efforts at being more active, low cholesterol diet, and weight control.  You are otherwise up to date with prevention measures today.  Please keep your appointments with your specialists as you may have planned  Please go to the XRAY Department in the Basement (go straight as you get off the elevator) for the x-ray testing  Please go to the LAB in the Basement (turn left off the elevator) for the tests to be done today  You will be contacted by phone if any changes need to be made immediately.  Otherwise, you will receive a letter about your results with an explanation, but please check with MyChart first.  Please remember to sign up for MyChart if you have not done so, as this will be important to you in the future with finding out test results, communicating by private email, and scheduling acute appointments online when needed.  If you have Medicare related insurance (such as traditional Medicare, [employer] or [employer], or similar), Please make an appointment at the [facility] with Sharee Pimple, the [employer], for your Wellness Visit in this office, which is a benefit with your insurance.  Please return in 3 months, or sooner if needed

## 2016-09-17 NOTE — Telephone Encounter (Signed)
-----   Message from Biagio Borg, MD sent at 09/17/2016  1:03 PM EDT ----- Left message on MyChart, pt to cont same tx except  The test results show that your current treatment is OK, except there is a question of a new nodule that was not present at the last xray 2 months ago.    This is not definite however, and may be just "shadows" that come together at a place in the xray that can look like a Nodule.  So we should repeat the CXR in 1 week, and if this is a persistent concern, you should probably have a CT scan of the chest.  I will order the CXR and you would just come to the Xray Dept in 1 week.    [name] to please inform pt, I will do order

## 2016-09-17 NOTE — Telephone Encounter (Signed)
Called pt, LVM with a detailed msg of below information as requested by patient during OV.

## 2016-09-17 NOTE — Assessment & Plan Note (Signed)
Asympt, also for a1c today o/w stable overall by history and exam, and pt to continue medical treatment as before,  to f/u any worsening symptoms or concerns

## 2016-09-17 NOTE — Assessment & Plan Note (Signed)
stable overall by history and exam, recent data reviewed with pt, and pt to continue medical treatment as before,  to f/u any worsening symptoms or concerns BP Readings from Last 3 Encounters:  09/17/16 120/76  07/18/16 122/86  06/27/16 (!) 116/53

## 2016-09-17 NOTE — Assessment & Plan Note (Signed)

## 2016-09-17 NOTE — Assessment & Plan Note (Signed)
Etiology unclear, also for cxr and bnp today r/o volume overload

## 2016-10-03 ENCOUNTER — Telehealth: Payer: Self-pay | Admitting: Internal Medicine

## 2016-10-03 ENCOUNTER — Ambulatory Visit (INDEPENDENT_AMBULATORY_CARE_PROVIDER_SITE_OTHER)
Admission: RE | Admit: 2016-10-03 | Discharge: 2016-10-03 | Disposition: A | Discharge status: Home / Self Care | Payer: Medicare Other | Source: Ambulatory Visit | Attending: Internal Medicine | Admitting: Internal Medicine

## 2016-10-03 DIAGNOSIS — R911 Solitary pulmonary nodule: Secondary | ICD-10-CM

## 2016-10-03 DIAGNOSIS — R300 Dysuria: Secondary | ICD-10-CM

## 2016-10-03 DIAGNOSIS — I517 Cardiomegaly: Secondary | ICD-10-CM | POA: Diagnosis not present

## 2016-10-03 NOTE — Telephone Encounter (Signed)
OK for lab - done

## 2016-10-03 NOTE — Telephone Encounter (Signed)
Pt has been notified.

## 2016-10-03 NOTE — Telephone Encounter (Signed)
Pt called in and thinks she has uti, she went to lab today and she already picked up a bottle.  She wants to know if Dr Jenny Reichmann can put a order in for a urine?    [phone]

## 2016-10-04 ENCOUNTER — Other Ambulatory Visit (INDEPENDENT_AMBULATORY_CARE_PROVIDER_SITE_OTHER): Payer: Medicare Other

## 2016-10-04 ENCOUNTER — Encounter: Payer: Self-pay | Admitting: Internal Medicine

## 2016-10-04 ENCOUNTER — Other Ambulatory Visit: Payer: Self-pay | Admitting: Internal Medicine

## 2016-10-04 ENCOUNTER — Telehealth: Payer: Self-pay

## 2016-10-04 DIAGNOSIS — R300 Dysuria: Secondary | ICD-10-CM | POA: Diagnosis not present

## 2016-10-04 LAB — URINALYSIS, ROUTINE W REFLEX MICROSCOPIC
BILIRUBIN URINE: NEGATIVE
HGB URINE DIPSTICK: NEGATIVE
KETONES UR: NEGATIVE
NITRITE: NEGATIVE
RBC / HPF: NONE SEEN (ref 0–?)
Specific Gravity, Urine: 1.025 (ref 1.000–1.030)
Total Protein, Urine: NEGATIVE
Urine Glucose: NEGATIVE
Urobilinogen, UA: 1 (ref 0.0–1.0)
pH: 6 (ref 5.0–8.0)

## 2016-10-04 MED ORDER — NITROFURANTOIN MONOHYD MACRO 100 MG PO CAPS: 100.0000 mg | ORAL_CAPSULE | Freq: Two times a day (BID) | ORAL | 0 refills | Status: DC

## 2016-10-04 NOTE — Telephone Encounter (Signed)
-----   Message from Biagio Borg, MD sent at 10/04/2016  2:36 PM EDT ----- Letter sent, cont same tx except  The test results show that your current treatment is OK, except the urine testing is possibly consistent with infection.  The urine culture will take 1-2 more days, but should go ahead and start antibiotic.  I will send this, and you should be called from the office as well.Redmond Baseman to please inform pt, I will do rx

## 2016-10-04 NOTE — Telephone Encounter (Signed)
Called pt, LVM.   

## 2016-10-07 LAB — URINE CULTURE

## 2016-10-08 ENCOUNTER — Telehealth: Payer: Self-pay | Admitting: Internal Medicine

## 2016-10-08 MED ORDER — PREDNISONE 10 MG PO TABS: ORAL_TABLET | ORAL | 0 refills | Status: DC

## 2016-10-08 NOTE — Telephone Encounter (Signed)
Pt has been informed.

## 2016-10-08 NOTE — Telephone Encounter (Signed)
Pt was informed of xray results but she still is having asthma issues. She wants to know if she can have another round of prednisone.

## 2016-10-08 NOTE — Telephone Encounter (Signed)
[place] for repeat prednisone, but will need appt for any further refills after this, thanks - done erx

## 2016-10-08 NOTE — Telephone Encounter (Signed)
Please call Pt back with results from chest Xray on 5/31

## 2016-10-15 ENCOUNTER — Encounter: Payer: Self-pay | Admitting: Oncology

## 2016-10-15 DIAGNOSIS — N6002 Solitary cyst of left breast: Secondary | ICD-10-CM | POA: Diagnosis not present

## 2016-10-15 DIAGNOSIS — Z853 Personal history of malignant neoplasm of breast: Secondary | ICD-10-CM | POA: Diagnosis not present

## 2016-10-15 LAB — HM MAMMOGRAPHY

## 2016-11-08 ENCOUNTER — Other Ambulatory Visit: Payer: Self-pay | Admitting: *Deleted

## 2016-11-08 ENCOUNTER — Telehealth: Payer: Self-pay | Admitting: Internal Medicine

## 2016-11-08 MED ORDER — FLUTICASONE PROPIONATE 50 MCG/ACT NA SUSP: 1.0000 | Freq: Every day | NASAL | 2 refills | Status: DC

## 2016-11-08 NOTE — Telephone Encounter (Signed)
Ok to hold off until Tuesday unless there is worsening fever or much increased productive cough

## 2016-11-08 NOTE — Telephone Encounter (Signed)
Pt left msg on triage stating she is needing refill on her flonase. The script that she has has expired. [facility]s [facility]. Verified chart pt is up-to-date. [facility] to [facility]...Johny Chess

## 2016-11-08 NOTE — Telephone Encounter (Signed)
Pt has been informed and expressed understanding.  

## 2016-11-08 NOTE — Telephone Encounter (Signed)
Pt is still having problems with the asthma, she made on appt for Tuesday and wants to know if Dr. Jenny Reichmann wants her to come in before then for another chest Xray

## 2016-11-12 ENCOUNTER — Ambulatory Visit (INDEPENDENT_AMBULATORY_CARE_PROVIDER_SITE_OTHER): Payer: Medicare Other | Admitting: Internal Medicine

## 2016-11-12 ENCOUNTER — Encounter: Payer: Self-pay | Admitting: Internal Medicine

## 2016-11-12 VITALS — BP 118/76 | HR 62 | Ht 66.0 in | Wt 314.0 lb

## 2016-11-12 DIAGNOSIS — I10 Essential (primary) hypertension: Secondary | ICD-10-CM | POA: Diagnosis not present

## 2016-11-12 DIAGNOSIS — I482 Chronic atrial fibrillation, unspecified: Secondary | ICD-10-CM

## 2016-11-12 DIAGNOSIS — J45901 Unspecified asthma with (acute) exacerbation: Secondary | ICD-10-CM | POA: Diagnosis not present

## 2016-11-12 MED ORDER — METHYLPREDNISOLONE ACETATE 80 MG/ML IJ SUSP
80.0000 mg | Freq: Once | INTRAMUSCULAR | Status: AC
  Administered 2016-11-12: 80 mg via INTRAMUSCULAR

## 2016-11-12 MED ORDER — MONTELUKAST SODIUM 10 MG PO TABS: 10.0000 mg | ORAL_TABLET | Freq: Every day | ORAL | 3 refills | Status: DC

## 2016-11-12 MED ORDER — BUDESONIDE-FORMOTEROL FUMARATE 160-4.5 MCG/ACT IN AERO: 2.0000 | INHALATION_SPRAY | Freq: Two times a day (BID) | RESPIRATORY_TRACT | 3 refills | Status: DC

## 2016-11-12 MED ORDER — PREDNISONE 10 MG PO TABS: ORAL_TABLET | ORAL | 0 refills | Status: DC

## 2016-11-12 NOTE — Patient Instructions (Signed)
You had the steroid shot today  Please take all new medication as prescribed - the prednisone, and the singulair 10 mg daily  Please continue all other medications as before, and refills have been done if requested.  Please have the pharmacy call with any other refills you may need.  Please continue your efforts at being more active, low cholesterol diet, and weight control.  Please keep your appointments with your specialists as you may have planned  You will be contacted regarding the referral for: Asthma/allergy

## 2016-11-12 NOTE — Progress Notes (Signed)
Subjective:    Patient ID: [name], female    DOB: [birth year], 81 y.o.   MRN: [id number]  HPI  Here to f/u with 2 months of ongoing symptoms gradually worsening now at least moderately uncomfortable sob, wheezing with chest congestion with scant prod cough, but no fever or colored sputum, .has had increased use of inhaler,   Pt denies chest pain, orthopnea, PND, increased LE swelling, palpitations, dizziness or syncope. No HA, sinus congestion, ST.   States she thinks she had echo 5 yrs ago prior to breast surgury with ok heart function  Has cont'd to try to lose weight.   Wt Readings from Last 3 Encounters:  11/12/16 (!) 314 lb (142.4 kg)  09/17/16 (!) 316 lb (143.3 kg)  07/18/16 (!) 320 lb (145.2 kg)   Past Medical History:  Diagnosis Date  . Allergy   . Anxiety   . Arthritis    no cartlidge in knees  . Asthma   . Asthma 03/24/2015  . Atrial fibrillation ([place]) 01/21/11  . 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 COPD (chronic obstructive pulmonary disease) ([place])   . Essential hypertension 03/24/2015  . Gout    post operative  . Gout 03/24/2015  . Hx of radiation therapy 04/02/11 -05/20/11   left breast  . Hypothyroidism 03/24/2015  . Incontinence of urine   . Malignant neoplasm of upper-outer quadrant of female breast ([place]) 12/26/2010  . OSA (obstructive sleep apnea) 03/24/2015  . Osteoporosis 03/24/2015  . Peripheral neuropathy 03/24/2015  . Plantar fasciitis   . Sleep apnea    Past Surgical History:  Procedure Laterality Date  . BREAST LUMPECTOMY W/ NEEDLE LOCALIZATION  01/29/2011   Left with [facility] Dr Margot Chimes  . CHOLECYSTECTOMY  1955  . CORONARY ANGIOPLASTY WITH STENT PLACEMENT  2000  . DILATION AND CURETTAGE OF UTERUS  1976   and 1996  . HAND SURGERY  1992   tendons between thumb and forefinger  . SKIN BIOPSY     1994, 2006, 2008, 2010, 2011, 2011, 2012-  pre-cancerous  . TONSILLECTOMY  1955    reports that she quit smoking about 53 years ago.  She smoked 1.00 pack per day. She has never used smokeless tobacco. She reports that she does not drink alcohol or use drugs. family history includes Asthma in her sister; Cancer in her father; Heart attack in her brother and father; Heart disease in her brother and father. Allergies  Allergen Reactions  . Crestor [Rosuvastatin Calcium]     Severe liver function problems  . Lipitor [Atorvastatin Calcium] Other (See Comments)    Elevated liver function   . Vicodin [Hydrocodone-Acetaminophen] Itching    Itching all over  . Codeine Nausea Only and Anxiety    Also complained of dizziness.  Has same problems with oxycodone and hydrocodone  . Oxycodone-Acetaminophen Nausea Only, Anxiety and Other (See Comments)    Visual Disturbance, Balance Difficulty, Dizzy "Room Spinning; "Swimming"  . Quinine Derivatives Nausea Only    dizzy  . Sulfa Antibiotics Nausea Only    Also complained of dizziness  . Keflex [Cephalexin]   . Epinephrine Other (See Comments)    Patient becomes "shaky"  . Penicillins Rash    Pt reported all over body rash in 1958 Has patient had a PCN reaction causing immediate rash, facial/tongue/throat swelling, SOB or lightheadedness with hypotension:Yes Has patient had a PCN reaction causing severe rash involving mucus membranes or skin necrosis: No Has patient  had a PCN reaction that required hospitalization: No Has patient had a PCN reaction occurring within the last 10 years:No    . Theophyllines Other (See Comments)    Patient becomes "shaky"   Current Outpatient Prescriptions on File Prior to Visit  Medication Sig Dispense Refill  . albuterol (PROVENTIL HFA;VENTOLIN HFA) 108 (90 Base) MCG/ACT inhaler Inhale 2 puffs into the lungs as needed for wheezing or shortness of breath. 18 g 3  . albuterol (PROVENTIL) (2.5 MG/3ML) 0.083% nebulizer solution Take 3 mLs (2.5 mg total) by nebulization every 6 (six) hours as needed for wheezing or shortness of breath. 75 mL 12  .  alendronate (FOSAMAX) 70 MG tablet take 1 tablet by mouth every week 4 tablet 3  . allopurinol (ZYLOPRIM) 100 MG tablet Take 300 mg by mouth daily.     Marland Kitchen ALPRAZolam (XANAX) 0.5 MG tablet TAKE 1 TO 2 TABLETS BY MOUTH TWICE DAILY IF NEEDED FOR ANXIETY/SLEEP 60 tablet 3  . atenolol (TENORMIN) 25 MG tablet take 1 tablet by mouth once daily 90 tablet 3  . azelastine (OPTIVAR) 0.05 % ophthalmic solution Place 1 drop into both eyes 2 (two) times daily. 6 mL 12  . Biotin 5000 MCG CAPS Take 5,000 mcg by mouth daily.     Marland Kitchen buPROPion (WELLBUTRIN XL) 150 MG 24 hr tablet Take 1 tablet (150 mg total) by mouth daily. 90 tablet 3  . cholecalciferol (VITAMIN D) 1000 UNITS tablet Take 1,000 Units by mouth daily.    Marland Kitchen co-enzyme Q-10 50 MG capsule Take 400 mg by mouth daily.    . colchicine (COLCRYS) 0.6 MG tablet Take 0.6 mg by mouth daily as needed (flare  up).     . escitalopram (LEXAPRO) 10 MG tablet take 1 tablet by mouth once daily 30 tablet 5  . ezetimibe (ZETIA) 10 MG tablet take 1/2 to 1 tablet by mouth daily 90 tablet 3  . fexofenadine (ALLEGRA) 180 MG tablet Take 1 tablet (180 mg total) by mouth daily. 30 tablet 3  . fluticasone (FLONASE) 50 MCG/ACT nasal spray Place 1 spray into both nostrils daily. 16 g 2  . gabapentin (NEURONTIN) 100 MG capsule Take 100 mg by mouth daily.  0  . hydrochlorothiazide (HYDRODIURIL) 25 MG tablet Take 2 tablets (50 mg total) by mouth daily. 180 tablet 3  . letrozole (FEMARA) 2.5 MG tablet Take 1 tablet (2.5 mg total) by mouth daily. 90 tablet 4  . levocetirizine (XYZAL) 5 MG tablet take 1 tablet by mouth at bedtime 30 tablet 5  . nitrofurantoin, macrocrystal-monohydrate, (MACROBID) 100 MG capsule Take 1 capsule (100 mg total) by mouth 2 (two) times daily. 20 capsule 0  . nystatin cream (MYCOSTATIN) Apply 1 application topically daily as needed for dry skin.   0  . potassium chloride SA (K-DUR,KLOR-CON) 20 MEQ tablet take 1 tablet by mouth once daily 30 tablet 3  .  rosuvastatin (CRESTOR) 10 MG tablet take 1 tablet by mouth 2 TIMESA A WEEK 10 tablet 9  . SYNTHROID 50 MCG tablet take 1 tablet by mouth once daily 30 tablet 3  . traMADol (ULTRAM) 50 MG tablet Take 50 mg by mouth every 12 (twelve) hours as needed for moderate pain.     . traMADol-acetaminophen (ULTRACET) 37.5-325 MG tablet Take 1-2 tablets by mouth every 6 (six) hours as needed for moderate pain or severe pain.   0  . triamcinolone (NASACORT AQ) 55 MCG/ACT AERO nasal inhaler Place 2 sprays into the nose daily. 1  Inhaler 12  . vitamin C (ASCORBIC ACID) 500 MG tablet Take 500 mg by mouth daily.    Alveda Reasons 20 MG TABS tablet take 1 tablet by mouth once daily 90 tablet 1   No current facility-administered medications on file prior to visit.    Review of Systems  Constitutional: Negative for other unusual diaphoresis or sweats HENT: Negative for ear discharge or swelling Eyes: Negative for other worsening visual disturbances Respiratory: Negative for stridor or other swelling  Gastrointestinal: Negative for worsening distension or other blood Genitourinary: Negative for retention or other urinary change Musculoskeletal: Negative for other MSK pain or swelling Skin: Negative for color change or other new lesions Neurological: Negative for worsening tremors and other numbness  Psychiatric/Behavioral: Negative for worsening agitation or other fatigue All other system neg per pt    Objective:   Physical Exam BP 118/76   Pulse 62   Ht '5\' 6"'$  (1.676 m)   Wt (!) 314 lb (142.4 kg)   SpO2 98%   BMI 50.68 kg/m  VS noted, not ill appearing, morbid obese, in wheelchair Constitutional: Pt appears in NAD HENT: Head: NCAT.  Right Ear: External ear normal.  Left Ear: External ear normal.  Eyes: . Pupils are equal, round, and reactive to light. Conjunctivae and EOM are normal Nose: without d/c or deformity Neck: Neck supple. Gross normal ROM Cardiovascular: Normal rate and irregular rhythm.     Pulmonary/Chest: Effort normal and breath sounds decreased without rales but with few scattered wheezing.  Neurological: Pt is alert. At baseline orientation, motor grossly intact Skin: Skin is warm. No rashes, other new lesions, no LE edema Psychiatric: Pt behavior is normal without agitation  No other exam findings  10/03/2016 CHEST  2 VIEW Stable cardiomegaly. Mild pulmonary vascular congestion. No overt pulmonary alveolar edema. No focal pulmonary nodule noted.   Lab Results  Component Value Date   WBC 7.8 09/17/2016   HGB 13.8 09/17/2016   HCT 41.8 09/17/2016   PLT 158.0 09/17/2016   GLUCOSE 96 09/17/2016   CHOL 200 09/17/2016   TRIG 242.0 (H) 09/17/2016   HDL 49.50 09/17/2016   LDLDIRECT 107.0 09/17/2016   LDLCALC 110 11/21/2015   ALT 20 09/17/2016   AST 18 09/17/2016   NA 141 09/17/2016   K 3.8 09/17/2016   CL 107 09/17/2016   CREATININE 0.82 09/17/2016   BUN 17 09/17/2016   CO2 26 09/17/2016   TSH 5.76 (H) 09/17/2016   HGBA1C 6.5 09/17/2016       Assessment & Plan:

## 2016-11-14 ENCOUNTER — Other Ambulatory Visit: Payer: Self-pay | Admitting: Internal Medicine

## 2016-11-14 ENCOUNTER — Other Ambulatory Visit: Payer: Self-pay | Admitting: Oncology

## 2016-11-15 NOTE — Assessment & Plan Note (Signed)
Stable rate and volume,  to f/u any worsening symptoms or concerns, on xarelto

## 2016-11-15 NOTE — Assessment & Plan Note (Signed)
stable overall by history and exam, recent data reviewed with pt, and pt to continue medical treatment as before,  to f/u any worsening symptoms or concerns BP Readings from Last 3 Encounters:  11/12/16 118/76  09/17/16 120/76  07/18/16 122/86

## 2016-11-15 NOTE — Assessment & Plan Note (Signed)
Mild to mod persistent, for depomedrol IM 80, predpac asd, add singulair mucinex otc, , cont albuterol MDI and restart the symbicort, also refer pulmonary per pt request

## 2016-12-05 ENCOUNTER — Telehealth: Payer: Self-pay | Admitting: Pharmacist

## 2016-12-05 NOTE — Telephone Encounter (Signed)
LMOM for pt - Safety Net Application was approved in June and have been unable to contact pt to see if she has started receiving shipments of Repatha. Will need to set up lab work once she has.

## 2016-12-09 ENCOUNTER — Encounter: Payer: Self-pay | Admitting: Emergency Medicine

## 2016-12-09 ENCOUNTER — Ambulatory Visit (INDEPENDENT_AMBULATORY_CARE_PROVIDER_SITE_OTHER): Payer: Medicare Other | Admitting: Emergency Medicine

## 2016-12-09 VITALS — BP 122/86 | HR 60 | Ht 66.0 in | Wt 303.0 lb

## 2016-12-09 DIAGNOSIS — J45909 Unspecified asthma, uncomplicated: Secondary | ICD-10-CM

## 2016-12-09 DIAGNOSIS — J301 Allergic rhinitis due to pollen: Secondary | ICD-10-CM | POA: Diagnosis not present

## 2016-12-09 DIAGNOSIS — J3089 Other allergic rhinitis: Secondary | ICD-10-CM

## 2016-12-09 DIAGNOSIS — J309 Allergic rhinitis, unspecified: Secondary | ICD-10-CM | POA: Insufficient documentation

## 2016-12-09 NOTE — Assessment & Plan Note (Signed)
Seasonal in nature. Her symptoms have not resolved this year according to their typical pattern. She is on a good regimen of antihistamine and nasal steroid. She has not done nasal saline this year.  Try restarting nasal saline washes once a day.  Take mucinex over the counter as needed for drainage and cough.  Continue your Allegra, Xyzal, fluticasone nasal spray as you are taking them  We will refer you for Allergy Testing to see if we can better control your nasosinus symptoms.

## 2016-12-09 NOTE — Assessment & Plan Note (Signed)
On CPAP with good compliance

## 2016-12-09 NOTE — Assessment & Plan Note (Addendum)
Unclear how much of her wheezing, dyspnea is related to true asthma versus upper airway disease. She does note that bronchodilator seemed to help her, consistent with some degree of true bronchospasm. On exam today her noise is completely upper airway. She needs PFTs to delineate. We will try to treat her allergic rhinitis more aggressively. As clear relationship between these symptoms and her dyspnea/wheeze.  We will repeat your pulmonary function testing to compare with your priors.  Please continue Symbicort 2 puffs twice a day for now.  Keep albuterol available to 2 puffs up to every 4 hours if needed for shortness of breath.

## 2016-12-09 NOTE — Progress Notes (Signed)
Subjective:    Patient ID: [name], female    DOB: 09-10-1935, 81 y.o.   MRN: [id number]  HPI 81 year old woman, former smoker (4 pack years), with a history of breast cancer post lumpectomy, radiation therapy, and letrozole 5 yrs. Also with a history of coronary disease, atrial fibrillation, OSA w good compliance CPAP, HTN, allergic rhinitis. She also carries a history of asthma that was made by Dr Isidoro Donning over 10 yrs ago when she experienced a possible aspiration event, experienced dyspnea, SOB. She reports that she has seasonal symptoms that are similar, usually Spring and Fall. Started as rhiinitis, evolved to dyspnea and some wheeze. Has typically been treated with pred tapers when she flared. She was formerly on Advair, changed to Symbicort this Spring. She believes that the BD's help her. Also on Singulair for several yrs. Also on allegra and xyzal, flonase. Last prednisone was early July. Sometimes a flare also includes cough - not a daily problems. Has albuterol, uses periodically, feels that she benefits. No overt GERD sx. No new exposures or meds. Minimal cough currently. She has had persistent nasal congestion, drainage.   She continues to have wheeze and dyspnea after recent Pred. Prompted this visit.   A chest x-ray from 10/03/16 was reviewed. This showed cardiomegaly, mild pulmonary vascular congestion without overt pulmonary edema, no infiltrates, no nodules.   Review of Systems  Constitutional: Negative for fever and unexpected weight change.  HENT: Negative for congestion, dental problem, ear pain, nosebleeds, postnasal drip, rhinorrhea, sinus pressure, sneezing, sore throat and trouble swallowing.   Eyes: Negative for redness and itching.  Respiratory: Positive for cough, chest tightness, shortness of breath and wheezing.   Cardiovascular: Negative for palpitations and leg swelling.  Gastrointestinal: Negative for nausea and vomiting.  Genitourinary: Negative for dysuria.    Musculoskeletal: Negative for joint swelling.  Skin: Negative for rash.  Neurological: Negative for headaches.  Hematological: Does not bruise/bleed easily.  Psychiatric/Behavioral: Negative for dysphoric mood. The patient is not nervous/anxious.     Past Medical History:  Diagnosis Date  . Allergy   . Anxiety   . Arthritis    no cartlidge in knees  . Asthma   . Asthma 03/24/2015  . Atrial fibrillation ([place]) 01/21/11  . Breast cancer ([place]) Receptor + her2 _ 12/26/2010   left  . COPD (chronic obstructive pulmonary disease) ([place])   . Essential hypertension 03/24/2015  . Gout    post operative  . Gout 03/24/2015  . Hx of radiation therapy 04/02/11 -05/20/11   left breast  . Hypothyroidism 03/24/2015  . Incontinence of urine   . Malignant neoplasm of upper-outer quadrant of female breast ([place]) 12/26/2010  . OSA (obstructive sleep apnea) 03/24/2015  . Osteoporosis 03/24/2015  . Peripheral neuropathy 03/24/2015  . Plantar fasciitis   . Sleep apnea      Family History  Problem Relation Age of Onset  . Heart disease Father   . Cancer Father        intestinal polyps  . Asthma Sister   . Heart disease Brother   . Heart attack Father   . Heart attack Brother      Social History   Social History  . Marital status: Married    Spouse name: N/A  . Number of children: N/A  . Years of education: N/A   Occupational History  . Not on file.   Social History Main Topics  . Smoking status: Former Smoker    Packs/day: 1.00  Years: 4.00    Quit date: 12/05/1962  . Smokeless tobacco: Never Used  . Alcohol use No  . Drug use: No  . Sexual activity: Not on file   Other Topics Concern  . Not on file   Social History Narrative  . No narrative on file  No smoker exposure.  No occupational exposures  Allergies  Allergen Reactions  . Crestor [Rosuvastatin Calcium]     Severe liver function problems  . Lipitor [Atorvastatin Calcium] Other (See Comments)    Elevated liver  function   . Vicodin [Hydrocodone-Acetaminophen] Itching    Itching all over  . Codeine Nausea Only and Anxiety    Also complained of dizziness.  Has same problems with oxycodone and hydrocodone  . Oxycodone-Acetaminophen Nausea Only, Anxiety and Other (See Comments)    Visual Disturbance, Balance Difficulty, Dizzy "Room Spinning; "Swimming"  . Quinine Derivatives Nausea Only    dizzy  . Sulfa Antibiotics Nausea Only    Also complained of dizziness  . Keflex [Cephalexin]   . Epinephrine Other (See Comments)    Patient becomes "shaky"  . Penicillins Rash    Pt reported all over body rash in 1958 Has patient had a PCN reaction causing immediate rash, facial/tongue/throat swelling, SOB or lightheadedness with hypotension:Yes Has patient had a PCN reaction causing severe rash involving mucus membranes or skin necrosis: No Has patient had a PCN reaction that required hospitalization: No Has patient had a PCN reaction occurring within the last 10 years:No    . Theophyllines Other (See Comments)    Patient becomes "shaky"     Outpatient Medications Prior to Visit  Medication Sig Dispense Refill  . albuterol (PROVENTIL HFA;VENTOLIN HFA) 108 (90 Base) MCG/ACT inhaler Inhale 2 puffs into the lungs as needed for wheezing or shortness of breath. 18 g 3  . albuterol (PROVENTIL) (2.5 MG/3ML) 0.083% nebulizer solution Take 3 mLs (2.5 mg total) by nebulization every 6 (six) hours as needed for wheezing or shortness of breath. 75 mL 12  . alendronate (FOSAMAX) 70 MG tablet take 1 tablet by mouth every week 4 tablet 3  . allopurinol (ZYLOPRIM) 100 MG tablet Take 300 mg by mouth daily.     Marland Kitchen ALPRAZolam (XANAX) 0.5 MG tablet TAKE 1 TO 2 TABLETS BY MOUTH TWICE DAILY IF NEEDED FOR ANXIETY/SLEEP 60 tablet 3  . atenolol (TENORMIN) 25 MG tablet take 1 tablet by mouth once daily 90 tablet 3  . azelastine (OPTIVAR) 0.05 % ophthalmic solution Place 1 drop into both eyes 2 (two) times daily. 6 mL 12  . Biotin  5000 MCG CAPS Take 5,000 mcg by mouth daily.     . budesonide-formoterol (SYMBICORT) 160-4.5 MCG/ACT inhaler Inhale 2 puffs into the lungs 2 (two) times daily. 1 Inhaler 3  . buPROPion (WELLBUTRIN XL) 150 MG 24 hr tablet Take 1 tablet (150 mg total) by mouth daily. 90 tablet 3  . cholecalciferol (VITAMIN D) 1000 UNITS tablet Take 1,000 Units by mouth daily.    Marland Kitchen co-enzyme Q-10 50 MG capsule Take 400 mg by mouth daily.    . colchicine (COLCRYS) 0.6 MG tablet Take 0.6 mg by mouth daily as needed (flare  up).     . escitalopram (LEXAPRO) 10 MG tablet take 1 tablet by mouth once daily 30 tablet 5  . ezetimibe (ZETIA) 10 MG tablet take 1/2 to 1 tablet by mouth daily 90 tablet 3  . fexofenadine (ALLEGRA) 180 MG tablet Take 1 tablet (180 mg total) by mouth daily.  30 tablet 3  . fluticasone (FLONASE) 50 MCG/ACT nasal spray Place 1 spray into both nostrils daily. 16 g 2  . gabapentin (NEURONTIN) 100 MG capsule Take 100 mg by mouth daily.  0  . hydrochlorothiazide (HYDRODIURIL) 25 MG tablet Take 2 tablets (50 mg total) by mouth daily. 180 tablet 3  . letrozole (FEMARA) 2.5 MG tablet Take 1 tablet (2.5 mg total) by mouth daily. 90 tablet 4  . levocetirizine (XYZAL) 5 MG tablet take 1 tablet by mouth at bedtime 30 tablet 5  . montelukast (SINGULAIR) 10 MG tablet Take 1 tablet (10 mg total) by mouth at bedtime. 90 tablet 3  . nitrofurantoin, macrocrystal-monohydrate, (MACROBID) 100 MG capsule Take 1 capsule (100 mg total) by mouth 2 (two) times daily. 20 capsule 0  . nystatin cream (MYCOSTATIN) Apply 1 application topically daily as needed for dry skin.   0  . potassium chloride SA (K-DUR,KLOR-CON) 20 MEQ tablet take 1 tablet by mouth once daily 30 tablet 3  . rosuvastatin (CRESTOR) 10 MG tablet take 1 tablet by mouth 2 TIMESA A WEEK 10 tablet 9  . SYNTHROID 50 MCG tablet take 1 tablet by mouth once daily 30 tablet 3  . SYNTHROID 50 MCG tablet take 1 tablet by mouth once daily 30 tablet 1  . traMADol  (ULTRAM) 50 MG tablet Take 50 mg by mouth every 12 (twelve) hours as needed for moderate pain.     . traMADol-acetaminophen (ULTRACET) 37.5-325 MG tablet Take 1-2 tablets by mouth every 6 (six) hours as needed for moderate pain or severe pain.   0  . triamcinolone (NASACORT AQ) 55 MCG/ACT AERO nasal inhaler Place 2 sprays into the nose daily. 1 Inhaler 12  . vitamin C (ASCORBIC ACID) 500 MG tablet Take 500 mg by mouth daily.    Carlena Hurl 20 MG TABS tablet take 1 tablet by mouth once daily 90 tablet 1  . predniSONE (DELTASONE) 10 MG tablet 3 tabs by mouth per day for 3 days,2tabs per day for 3 days,1tab per day for 3 days 18 tablet 0   No facility-administered medications prior to visit.         Objective:   Physical Exam Vitals:   12/09/16 1326 12/09/16 1327  BP:  122/86  Pulse:  60  SpO2:  96%  Weight: (!) 303 lb (137.4 kg)   Height: 5\' 6"  (1.676 m)    Gen: Pleasant, Obese woman, in no distress,  normal affect  ENT: No lesions,  mouth clear,  oropharynx clear, no active nasal drip  Neck: No JVD, loud expiratory stridor  Lungs: No use of accessory muscles, no apparent wheeze, small volumes, some referred UA noise.   Cardiovascular: Irregular, distant  Musculoskeletal: No deformities, no cyanosis or clubbing  Neuro: alert, non focal  Skin: Warm, no lesions or rashes      Assessment & Plan:  OSA (obstructive sleep apnea) On CPAP with good compliance  Asthma Unclear how much of her wheezing, dyspnea is related to true asthma versus upper airway disease. She does note that bronchodilator seemed to help her, consistent with some degree of true bronchospasm. On exam today her noise is completely upper airway. She needs PFTs to delineate. We will try to treat her allergic rhinitis more aggressively. As clear relationship between these symptoms and her dyspnea/wheeze.  We will repeat your pulmonary function testing to compare with your priors.  Please continue Symbicort 2  puffs twice a day for now.  Keep albuterol available  to 2 puffs up to every 4 hours if needed for shortness of breath.   Allergic rhinitis Seasonal in nature. Her symptoms have not resolved this year according to their typical pattern. She is on a good regimen of antihistamine and nasal steroid. She has not done nasal saline this year.  Try restarting nasal saline washes once a day.  Take mucinex over the counter as needed for drainage and cough.  Continue your Allegra, Xyzal, fluticasone nasal spray as you are taking them  We will refer you for Allergy Testing to see if we can better control your nasosinus symptoms.     Baltazar Apo, MD, PhD 12/09/2016, 2:06 PM [facility] [phone] or if no answer [phone]

## 2016-12-09 NOTE — Patient Instructions (Addendum)
We will repeat your pulmonary function testing to compare with your priors.  Please continue Symbicort 2 puffs twice a day for now.  Keep albuterol available to 2 puffs up to every 4 hours if needed for shortness of breath.  Try restarting nasal saline washes once a day.  Take mucinex over the counter as needed for drainage and cough.  Continue your Allegra, Xyzal, fluticasone nasal spray as you are taking them  We will refer you for Allergy Testing to see if we can better control your nasosinus symptoms.   Follow with Dr Lamonte Sakai next available with full PFT.

## 2016-12-10 ENCOUNTER — Ambulatory Visit: Payer: Medicare Other | Admitting: Emergency Medicine

## 2016-12-10 DIAGNOSIS — M1009 Idiopathic gout, multiple sites: Secondary | ICD-10-CM | POA: Diagnosis not present

## 2016-12-10 DIAGNOSIS — M170 Bilateral primary osteoarthritis of knee: Secondary | ICD-10-CM | POA: Diagnosis not present

## 2016-12-10 DIAGNOSIS — Z79899 Other long term (current) drug therapy: Secondary | ICD-10-CM | POA: Diagnosis not present

## 2016-12-11 ENCOUNTER — Other Ambulatory Visit: Payer: Self-pay | Admitting: Interventional Cardiology

## 2016-12-19 ENCOUNTER — Encounter: Payer: Self-pay | Admitting: Internal Medicine

## 2016-12-19 ENCOUNTER — Ambulatory Visit (INDEPENDENT_AMBULATORY_CARE_PROVIDER_SITE_OTHER): Payer: Medicare Other | Admitting: Internal Medicine

## 2016-12-19 VITALS — BP 126/84 | HR 64 | Temp 97.5°F | Ht 66.0 in | Wt 303.0 lb

## 2016-12-19 DIAGNOSIS — E785 Hyperlipidemia, unspecified: Secondary | ICD-10-CM | POA: Diagnosis not present

## 2016-12-19 DIAGNOSIS — M2041 Other hammer toe(s) (acquired), right foot: Secondary | ICD-10-CM | POA: Diagnosis not present

## 2016-12-19 DIAGNOSIS — I10 Essential (primary) hypertension: Secondary | ICD-10-CM | POA: Diagnosis not present

## 2016-12-19 DIAGNOSIS — M2042 Other hammer toe(s) (acquired), left foot: Secondary | ICD-10-CM

## 2016-12-19 DIAGNOSIS — R739 Hyperglycemia, unspecified: Secondary | ICD-10-CM | POA: Diagnosis not present

## 2016-12-19 DIAGNOSIS — J45909 Unspecified asthma, uncomplicated: Secondary | ICD-10-CM

## 2016-12-19 DIAGNOSIS — M1A9XX0 Chronic gout, unspecified, without tophus (tophi): Secondary | ICD-10-CM | POA: Diagnosis not present

## 2016-12-19 NOTE — Progress Notes (Signed)
Subjective:    Patient ID: [name], female    DOB: Apr 27, 1936, 81 y.o.   MRN: [id number]  HPI  Here to f/u; overall doing ok,  Pt denies chest pain, increasing sob or doe, wheezing, orthopnea, PND, increased LE swelling, palpitations, dizziness or syncope.  Pt denies new neurological symptoms such as new headache, or facial or extremity weakness or numbness.  Pt denies polydipsia, polyuria, or low sugar episode.  Pt states overall good compliance with meds, mostly trying to follow appropriate diet, with wt overall stable,  but no regualar exercise however.  Has numerous other issues today - Also sees Dr Hawkes/rheum for gout, now stable on allopurinol and asks to f/u here now instead.  Also had bilat finger OA, also has bilat hammer toes, and is intererested in podiatry as is starting to rub toes on the shoes. On xarelto with mult bruises to arms more than usual lately, no other overt bleeding.  Has seen Dr lupton/derm for hair loss but the lotion she was given seemed to make it worse, wondering if atenolol is making hair loss worst. Feels occasionally like a lump in the throat but feels it is most likely allergy related and post nasal congestion, just wondering if she needs EGD.   Has seen Dr Lamonte Sakai [facility] but she still feels she has persistent symptoms mostly wheezing and frustrated with this, due for PFT's in sept, then allergy testing after that.  Not clear she has asthma per pt per Dr Ileene Musa and now she is even more concerned that all this time she thought she had asthma, now may not. Wants me to reassure her that Dr Lamonte Sakai seems to be doing the "right thing".  Has had hair loss with more thinning it seems in the past few months.  Also has a knot to the left postlat neck without change or overlying skin change.  Still with bilat knee pain, walks with walker at home, but always uses wheelchair out of the home.   Past Medical History:  Diagnosis Date  . Allergy   . Anxiety   . Arthritis    no  cartlidge in knees  . Asthma   . Asthma 03/24/2015  . Atrial fibrillation ([place]) 01/21/11  . 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 COPD (chronic obstructive pulmonary disease) ([place])   . Essential hypertension 03/24/2015  . Gout    post operative  . Gout 03/24/2015  . Hx of radiation therapy 04/02/11 -05/20/11   left breast  . Hypothyroidism 03/24/2015  . Incontinence of urine   . Malignant neoplasm of upper-outer quadrant of female breast ([place]) 12/26/2010  . OSA (obstructive sleep apnea) 03/24/2015  . Osteoporosis 03/24/2015  . Peripheral neuropathy 03/24/2015  . Plantar fasciitis   . Sleep apnea    Past Surgical History:  Procedure Laterality Date  . BREAST LUMPECTOMY W/ NEEDLE LOCALIZATION  01/29/2011   Left with [facility] Dr Margot Chimes  . CHOLECYSTECTOMY  1955  . CORONARY ANGIOPLASTY WITH STENT PLACEMENT  2000  . DILATION AND CURETTAGE OF UTERUS  1976   and 1996  . HAND SURGERY  1992   tendons between thumb and forefinger  . SKIN BIOPSY     1994, 2006, 2008, 2010, 2011, 2011, 2012-  pre-cancerous  . TONSILLECTOMY  1955    reports that she quit smoking about 54 years ago. She has a 4.00 pack-year smoking history. She has never used smokeless tobacco. She reports that she does  not drink alcohol or use drugs. family history includes Asthma in her sister; Cancer in her father; Heart attack in her brother and father; Heart disease in her brother and father. Allergies  Allergen Reactions  . Crestor [Rosuvastatin Calcium]     Severe liver function problems  . Lipitor [Atorvastatin Calcium] Other (See Comments)    Elevated liver function   . Vicodin [Hydrocodone-Acetaminophen] Itching    Itching all over  . Codeine Nausea Only and Anxiety    Also complained of dizziness.  Has same problems with oxycodone and hydrocodone  . Oxycodone-Acetaminophen Nausea Only, Anxiety and Other (See Comments)    Visual Disturbance, Balance Difficulty, Dizzy "Room Spinning; "Swimming"    . Quinine Derivatives Nausea Only    dizzy  . Sulfa Antibiotics Nausea Only    Also complained of dizziness  . Keflex [Cephalexin]   . Epinephrine Other (See Comments)    Patient becomes "shaky"  . Penicillins Rash    Pt reported all over body rash in 1958 Has patient had a PCN reaction causing immediate rash, facial/tongue/throat swelling, SOB or lightheadedness with hypotension:Yes Has patient had a PCN reaction causing severe rash involving mucus membranes or skin necrosis: No Has patient had a PCN reaction that required hospitalization: No Has patient had a PCN reaction occurring within the last 10 years:No    . Theophyllines Other (See Comments)    Patient becomes "shaky"   Current Outpatient Prescriptions on File Prior to Visit  Medication Sig Dispense Refill  . albuterol (PROVENTIL HFA;VENTOLIN HFA) 108 (90 Base) MCG/ACT inhaler Inhale 2 puffs into the lungs as needed for wheezing or shortness of breath. 18 g 3  . albuterol (PROVENTIL) (2.5 MG/3ML) 0.083% nebulizer solution Take 3 mLs (2.5 mg total) by nebulization every 6 (six) hours as needed for wheezing or shortness of breath. 75 mL 12  . alendronate (FOSAMAX) 70 MG tablet take 1 tablet by mouth every week 4 tablet 3  . allopurinol (ZYLOPRIM) 100 MG tablet Take 300 mg by mouth daily.     Marland Kitchen ALPRAZolam (XANAX) 0.5 MG tablet TAKE 1 TO 2 TABLETS BY MOUTH TWICE DAILY IF NEEDED FOR ANXIETY/SLEEP 60 tablet 3  . atenolol (TENORMIN) 25 MG tablet take 1 tablet by mouth once daily 90 tablet 3  . azelastine (OPTIVAR) 0.05 % ophthalmic solution Place 1 drop into both eyes 2 (two) times daily. 6 mL 12  . Biotin 5000 MCG CAPS Take 5,000 mcg by mouth daily.     . budesonide-formoterol (SYMBICORT) 160-4.5 MCG/ACT inhaler Inhale 2 puffs into the lungs 2 (two) times daily. 1 Inhaler 3  . buPROPion (WELLBUTRIN XL) 150 MG 24 hr tablet Take 1 tablet (150 mg total) by mouth daily. 90 tablet 3  . cholecalciferol (VITAMIN D) 1000 UNITS tablet Take  1,000 Units by mouth daily.    Marland Kitchen co-enzyme Q-10 50 MG capsule Take 400 mg by mouth daily.    . colchicine (COLCRYS) 0.6 MG tablet Take 0.6 mg by mouth daily as needed (flare  up).     . escitalopram (LEXAPRO) 10 MG tablet take 1 tablet by mouth once daily 30 tablet 5  . ezetimibe (ZETIA) 10 MG tablet take 1/2 to 1 tablet by mouth daily 30 tablet 0  . fexofenadine (ALLEGRA) 180 MG tablet Take 1 tablet (180 mg total) by mouth daily. 30 tablet 3  . fluticasone (FLONASE) 50 MCG/ACT nasal spray Place 1 spray into both nostrils daily. 16 g 2  . gabapentin (NEURONTIN) 100 MG capsule  Take 100 mg by mouth daily.  0  . hydrochlorothiazide (HYDRODIURIL) 25 MG tablet Take 2 tablets (50 mg total) by mouth daily. 180 tablet 3  . letrozole (FEMARA) 2.5 MG tablet Take 1 tablet (2.5 mg total) by mouth daily. 90 tablet 4  . levocetirizine (XYZAL) 5 MG tablet take 1 tablet by mouth at bedtime 30 tablet 5  . montelukast (SINGULAIR) 10 MG tablet Take 1 tablet (10 mg total) by mouth at bedtime. 90 tablet 3  . nitrofurantoin, macrocrystal-monohydrate, (MACROBID) 100 MG capsule Take 1 capsule (100 mg total) by mouth 2 (two) times daily. 20 capsule 0  . nystatin cream (MYCOSTATIN) Apply 1 application topically daily as needed for dry skin.   0  . potassium chloride SA (K-DUR,KLOR-CON) 20 MEQ tablet take 1 tablet by mouth once daily 30 tablet 3  . rosuvastatin (CRESTOR) 10 MG tablet take 1 tablet by mouth 2 TIMESA A WEEK 10 tablet 9  . SYNTHROID 50 MCG tablet take 1 tablet by mouth once daily 30 tablet 1  . traMADol (ULTRAM) 50 MG tablet Take 50 mg by mouth every 12 (twelve) hours as needed for moderate pain.     . traMADol-acetaminophen (ULTRACET) 37.5-325 MG tablet Take 1-2 tablets by mouth every 6 (six) hours as needed for moderate pain or severe pain.   0  . triamcinolone (NASACORT AQ) 55 MCG/ACT AERO nasal inhaler Place 2 sprays into the nose daily. 1 Inhaler 12  . vitamin C (ASCORBIC ACID) 500 MG tablet Take 500 mg  by mouth daily.    Alveda Reasons 20 MG TABS tablet take 1 tablet by mouth once daily 90 tablet 1   No current facility-administered medications on file prior to visit.    Review of Systems  Constitutional: Negative for other unusual diaphoresis or sweats HENT: Negative for ear discharge or swelling Eyes: Negative for other worsening visual disturbances Respiratory: Negative for stridor or other swelling  Gastrointestinal: Negative for worsening distension or other blood Genitourinary: Negative for retention or other urinary change Musculoskeletal: Negative for other MSK pain or swelling Skin: Negative for color change or other new lesions Neurological: Negative for worsening tremors and other numbness  Psychiatric/Behavioral: Negative for worsening agitation or other fatigue All other system neg per pt    Objective:   Physical Exam BP 126/84   Pulse 64   Temp (!) 97.5 F (36.4 C)   Ht '5\' 6"'$  (1.676 m)   Wt (!) 303 lb (137.4 kg)   SpO2 98%   BMI 48.91 kg/m  VS noted,  Constitutional: Pt appears in NAD HENT: Head: NCAT.  Right Ear: External ear normal.  Left Ear: External ear normal.  Eyes: . Pupils are equal, round, and reactive to light. Conjunctivae and EOM are normal Nose: without d/c or deformity Neck: Neck supple. Gross normal ROM Cardiovascular: Normal rate and regular rhythm.   Pulmonary/Chest: Effort normal and breath sounds without rales, and has few exp wheezing.  Abd:  Soft, NT, ND, + BS, no organomegaly Neurological: Pt is alert. At baseline orientation, motor grossly intact Skin: Skin is warm. No rashes, other new lesions, no LE edema Psychiatric: Pt behavior is normal without agitation , 1-2+ nervous     Assessment & Plan:

## 2016-12-19 NOTE — Assessment & Plan Note (Signed)
No active joint pain or swelling, ok for uric acid level with next labs, cont allopurinol

## 2016-12-19 NOTE — Assessment & Plan Note (Signed)
I d/w pt I think Dr Lamonte Sakai is doing the "right thing" with further PFT and allergy testing to quantify and determine the accuracy of diagnoses, as further directed tx will be more specific and appropriate

## 2016-12-19 NOTE — Assessment & Plan Note (Signed)
bilat second toes, for podiatry referral

## 2016-12-19 NOTE — Patient Instructions (Addendum)
You will be contacted regarding the referral for: podiatry  Please continue all other medications as before, and refills have been done if requested.  Please have the pharmacy call with any other refills you may need.  Please continue your efforts at being more active, low cholesterol diet, and weight control.  Please keep your appointments with your specialists as you may have planned  Please return in 3 months, or sooner if needed, with Lab testing done 3-5 days before

## 2016-12-19 NOTE — Assessment & Plan Note (Signed)
stable overall by history and exam, recent data reviewed with pt, and pt to continue medical treatment as before,  to f/u any worsening symptoms or concerns ble Lab Results  Component Value Date   LDLCALC 110 11/21/2015

## 2016-12-19 NOTE — Assessment & Plan Note (Signed)
stable overall by history and exam, recent data reviewed with pt, and pt to continue medical treatment as before,  to f/u any worsening symptoms or concerns Lab Results  Component Value Date   HGBA1C 6.5 09/17/2016  for f/u next visit

## 2016-12-19 NOTE — Assessment & Plan Note (Addendum)
stable overall by history and exam, recent data reviewed with pt, and pt to continue medical treatment as before,  to f/u any worsening symptoms or concerns BP Readings from Last 3 Encounters:  12/19/16 126/84  12/09/16 122/86  11/12/16 118/76   Note:  Total time for pt hx, exam, review of record with pt in the room, determination of diagnoses and plan for further eval and tx is > 40 min, with over 50% spent in coordination and counseling of patient, including the differential dx, further evaluation, tx and management of HTN, HLD, gout, hammer toes, hyperglycemia,, and asthma evalaution and tx

## 2017-01-07 ENCOUNTER — Other Ambulatory Visit: Payer: Self-pay | Admitting: Internal Medicine

## 2017-01-07 ENCOUNTER — Other Ambulatory Visit: Payer: Self-pay | Admitting: Interventional Cardiology

## 2017-01-10 ENCOUNTER — Telehealth: Payer: Self-pay | Admitting: Pharmacist

## 2017-01-10 NOTE — Addendum Note (Signed)
Addended by: SUPPLE, MEGAN E on: 01/10/2017 02:44 PM   Modules accepted: Orders

## 2017-01-10 NOTE — Telephone Encounter (Signed)
Pt called clinic - states she has had a lot of breathing problems and has not started on Repatha yet. Provided her with Production assistant, radio #. She states she will call them to set up [place] shipment and will let us know once she has started her injections so that we can schedule follow up lab work.

## 2017-01-10 NOTE — Telephone Encounter (Signed)
LMOM again for pt. She has been approved for Repatha for 2-3 months and have been unable to contact pt to see if she has received her shipment and started with her injections.

## 2017-01-15 DIAGNOSIS — J4540 Moderate persistent asthma, uncomplicated: Secondary | ICD-10-CM | POA: Diagnosis not present

## 2017-01-15 DIAGNOSIS — R062 Wheezing: Secondary | ICD-10-CM | POA: Diagnosis not present

## 2017-01-15 DIAGNOSIS — J301 Allergic rhinitis due to pollen: Secondary | ICD-10-CM | POA: Diagnosis not present

## 2017-01-15 DIAGNOSIS — T63461A Toxic effect of venom of wasps, accidental (unintentional), initial encounter: Secondary | ICD-10-CM | POA: Diagnosis not present

## 2017-01-27 DIAGNOSIS — M1711 Unilateral primary osteoarthritis, right knee: Secondary | ICD-10-CM | POA: Diagnosis not present

## 2017-01-27 DIAGNOSIS — M1712 Unilateral primary osteoarthritis, left knee: Secondary | ICD-10-CM | POA: Diagnosis not present

## 2017-02-01 ENCOUNTER — Other Ambulatory Visit: Payer: Self-pay | Admitting: Internal Medicine

## 2017-02-01 ENCOUNTER — Other Ambulatory Visit: Payer: Self-pay | Admitting: Interventional Cardiology

## 2017-02-06 ENCOUNTER — Ambulatory Visit (INDEPENDENT_AMBULATORY_CARE_PROVIDER_SITE_OTHER): Payer: Medicare Other | Admitting: Emergency Medicine

## 2017-02-06 ENCOUNTER — Encounter: Payer: Self-pay | Admitting: Emergency Medicine

## 2017-02-06 DIAGNOSIS — J45909 Unspecified asthma, uncomplicated: Secondary | ICD-10-CM

## 2017-02-06 DIAGNOSIS — G4733 Obstructive sleep apnea (adult) (pediatric): Secondary | ICD-10-CM

## 2017-02-06 DIAGNOSIS — J301 Allergic rhinitis due to pollen: Secondary | ICD-10-CM

## 2017-02-06 LAB — PULMONARY FUNCTION TEST
DL/VA % pred: 98 %
DL/VA: 5.02 ml/min/mmHg/L
DLCO cor % pred: 70 %
DLCO cor: 19.57 ml/min/mmHg
DLCO unc % pred: 75 %
DLCO unc: 20.72 ml/min/mmHg
FEF 25-75 Post: 1.78 L/sec
FEF 25-75 Pre: 1.34 L/sec
FEF2575-%Change-Post: 32 %
FEF2575-%Pred-Post: 112 %
FEF2575-%Pred-Pre: 84 %
FEV1-%Change-Post: 4 %
FEV1-%Pred-Post: 79 %
FEV1-%Pred-Pre: 75 %
FEV1-Post: 1.75 L
FEV1-Pre: 1.67 L
FEV1FVC-%Change-Post: 6 %
FEV1FVC-%Pred-Pre: 103 %
FEV6-%Change-Post: -4 %
FEV6-%Pred-Post: 74 %
FEV6-%Pred-Pre: 78 %
FEV6-Post: 2.08 L
FEV6-Pre: 2.18 L
FEV6FVC-%Change-Post: 0 %
FEV6FVC-%Pred-Post: 105 %
FEV6FVC-%Pred-Pre: 105 %
FVC-%Change-Post: -1 %
FVC-%Pred-Post: 73 %
FVC-%Pred-Pre: 74 %
FVC-Post: 2.15 L
FVC-Pre: 2.19 L
Post FEV1/FVC ratio: 81 %
Post FEV6/FVC ratio: 100 %
Pre FEV1/FVC ratio: 76 %
Pre FEV6/FVC Ratio: 100 %
RV % pred: 85 %
RV: 2.13 L
TLC % pred: 83 %
TLC: 4.55 L

## 2017-02-06 NOTE — Assessment & Plan Note (Signed)
He had skin testing and is allergic to multiple exposures including dust mites. She's benefited significantly from talking to Dr. Remus Blake, especially with regard to the exposures at her home. Astelin was added to her maintenance allergy regimen and she has benefited. She will continue this. She will follow-up with Dr Remus Blake, discuss any role for immunotherapy depending on her status.

## 2017-02-06 NOTE — Assessment & Plan Note (Signed)
Continue CPAP as ordered 

## 2017-02-06 NOTE — Patient Instructions (Signed)
Please continue current medications as you have been taking them Agree with slowly and steadily increasing your exercise to work on your stamina, weight loss Follow with Dr Remus Blake as planned.  Wear your CPAP mask every night Follow with Dr Lamonte Sakai in 6 months or sooner if you have any problems

## 2017-02-06 NOTE — Assessment & Plan Note (Signed)
Mixed disease on spirometry from today. Restriction from obesity in addition to some mild fixed asthma. She is tolerating Symbicort, uses albuterol less frequently now that her allergic rhinitis is better controlled. Discussed meds, strategies to increase her cardiopulmonary conditioning today.

## 2017-02-06 NOTE — Progress Notes (Signed)
PFT done today. 

## 2017-02-06 NOTE — Progress Notes (Signed)
Subjective:    Patient ID: [name], female    DOB: 1936-02-24, 81 y.o.   MRN: [id number]  Asthma  She complains of cough, shortness of breath and wheezing. Pertinent negatives include no ear pain, fever, headaches, postnasal drip, rhinorrhea, sneezing, sore throat or trouble swallowing. Her past medical history is significant for asthma.   81 year old woman, former smoker (4 pack years), with a history of breast cancer post lumpectomy, radiation therapy, and letrozole 5 yrs. Also with a history of coronary disease, atrial fibrillation, OSA w good compliance CPAP, HTN, allergic rhinitis. She also carries a history of asthma that was made by Dr Isidoro Donning over 10 yrs ago when she experienced a possible aspiration event, experienced dyspnea, SOB. She reports that she has seasonal symptoms that are similar, usually Spring and Fall. Started as rhiinitis, evolved to dyspnea and some wheeze. Has typically been treated with pred tapers when she flared. She was formerly on Advair, changed to Symbicort this Spring. She believes that the BD's help her. Also on Singulair for several yrs. Also on allegra and xyzal, flonase. Last prednisone was early July. Sometimes a flare also includes cough - not a daily problems. Has albuterol, uses periodically, feels that she benefits. No overt GERD sx. No new exposures or meds. Minimal cough currently. She has had persistent nasal congestion, drainage.   She continues to have wheeze and dyspnea after recent Pred. Prompted this visit.   A chest x-ray from 10/03/16 was reviewed. This showed cardiomegaly, mild pulmonary vascular congestion without overt pulmonary edema, no infiltrates, no nodules.  ROV 02/06/17 -- This follow-up visit for patient with a history of breast cancer, coronary disease, CAD, atrial fibrillation with some degree diastolic dysfunction, obstructive sleep apnea on CPAP, allergic rhinitis. Also carries a history of suspected asthma. She underwent pulmonary  function testing today that I have personally reviewed. This shows spirometry with mixed obstruction and restriction and a mild curve to her flow volume loop. FEV1 75% predicted. Lung volumes were normal. Diffusion capacity corrected to the normal range for alveolar volume. Currently managed on Symbicort and albuterol as needed. She restarted nasal saline - has helped her. Also added astelin to her allergy regimen. Saw Dr Remus Blake, had skin testing. Reports that her cough and breathing are better than last time.    Review of Systems  Constitutional: Negative for fever and unexpected weight change.  HENT: Negative for congestion, dental problem, ear pain, nosebleeds, postnasal drip, rhinorrhea, sinus pressure, sneezing, sore throat and trouble swallowing.   Eyes: Negative for redness and itching.  Respiratory: Positive for cough, chest tightness, shortness of breath and wheezing.   Cardiovascular: Negative for palpitations and leg swelling.  Gastrointestinal: Negative for nausea and vomiting.  Genitourinary: Negative for dysuria.  Musculoskeletal: Negative for joint swelling.  Skin: Negative for rash.  Neurological: Negative for headaches.  Hematological: Does not bruise/bleed easily.  Psychiatric/Behavioral: Negative for dysphoric mood. The patient is not nervous/anxious.     Past Medical History:  Diagnosis Date  . Allergy   . Anxiety   . Arthritis    no cartlidge in knees  . Asthma   . Asthma 03/24/2015  . Atrial fibrillation ([address]) 01/21/11  . Breast cancer ([place]) Receptor + her2 _ 12/26/2010   left  . COPD (chronic obstructive pulmonary disease) ([place])   . Essential hypertension 03/24/2015  . Gout    post operative  . Gout 03/24/2015  . Hx of radiation therapy 04/02/11 -05/20/11   left breast  .  Hypothyroidism 03/24/2015  . Incontinence of urine   . Malignant neoplasm of upper-outer quadrant of female breast ([place]) 12/26/2010  . OSA (obstructive sleep apnea) 03/24/2015  .  Osteoporosis 03/24/2015  . Peripheral neuropathy 03/24/2015  . Plantar fasciitis   . Sleep apnea      Family History  Problem Relation Age of Onset  . Heart disease Father   . Cancer Father        intestinal polyps  . Asthma Sister   . Heart disease Brother   . Heart attack Father   . Heart attack Brother      Social History   Social History  . Marital status: Married    Spouse name: N/A  . Number of children: N/A  . Years of education: N/A   Occupational History  . Not on file.   Social History Main Topics  . Smoking status: Former Smoker    Packs/day: 1.00    Years: 4.00    Quit date: 12/05/1962  . Smokeless tobacco: Never Used  . Alcohol use No  . Drug use: No  . Sexual activity: Not on file   Other Topics Concern  . Not on file   Social History Narrative  . No narrative on file  No smoker exposure.  No occupational exposures  Allergies  Allergen Reactions  . Crestor [Rosuvastatin Calcium]     Severe liver function problems  . Lipitor [Atorvastatin Calcium] Other (See Comments)    Elevated liver function   . Vicodin [Hydrocodone-Acetaminophen] Itching    Itching all over  . Codeine Nausea Only and Anxiety    Also complained of dizziness.  Has same problems with oxycodone and hydrocodone  . Oxycodone-Acetaminophen Nausea Only, Anxiety and Other (See Comments)    Visual Disturbance, Balance Difficulty, Dizzy "Room Spinning; "Swimming"  . Quinine Derivatives Nausea Only    dizzy  . Sulfa Antibiotics Nausea Only    Also complained of dizziness  . Keflex [Cephalexin]   . Epinephrine Other (See Comments)    Patient becomes "shaky"  . Penicillins Rash    Pt reported all over body rash in 1958 Has patient had a PCN reaction causing immediate rash, facial/tongue/throat swelling, SOB or lightheadedness with hypotension:Yes Has patient had a PCN reaction causing severe rash involving mucus membranes or skin necrosis: No Has patient had a PCN reaction that  required hospitalization: No Has patient had a PCN reaction occurring within the last 10 years:No    . Theophyllines Other (See Comments)    Patient becomes "shaky"     Outpatient Medications Prior to Visit  Medication Sig Dispense Refill  . albuterol (PROVENTIL HFA;VENTOLIN HFA) 108 (90 Base) MCG/ACT inhaler Inhale 2 puffs into the lungs as needed for wheezing or shortness of breath. 18 g 3  . albuterol (PROVENTIL) (2.5 MG/3ML) 0.083% nebulizer solution Take 3 mLs (2.5 mg total) by nebulization every 6 (six) hours as needed for wheezing or shortness of breath. 75 mL 12  . alendronate (FOSAMAX) 70 MG tablet take 1 tablet by mouth every week 4 tablet 3  . allopurinol (ZYLOPRIM) 100 MG tablet Take 300 mg by mouth daily.     Marland Kitchen ALPRAZolam (XANAX) 0.5 MG tablet TAKE 1 TO 2 TABLETS BY MOUTH TWICE DAILY IF NEEDED FOR ANXIETY/SLEEP 60 tablet 3  . atenolol (TENORMIN) 25 MG tablet Take 1 tablet (25 mg total) by mouth daily. PATIENT IS OVERDUE FOR AN APPT AND MUST CALL AND SCHEDULE FOR FURTHER REFILLS 3RD/FINAL ATTEMPT 15 tablet 0  .  azelastine (OPTIVAR) 0.05 % ophthalmic solution Place 1 drop into both eyes 2 (two) times daily. 6 mL 12  . Biotin 5000 MCG CAPS Take 5,000 mcg by mouth daily.     . budesonide-formoterol (SYMBICORT) 160-4.5 MCG/ACT inhaler Inhale 2 puffs into the lungs 2 (two) times daily. 1 Inhaler 3  . buPROPion (WELLBUTRIN XL) 150 MG 24 hr tablet Take 1 tablet (150 mg total) by mouth daily. 90 tablet 3  . cholecalciferol (VITAMIN D) 1000 UNITS tablet Take 1,000 Units by mouth daily.    Marland Kitchen co-enzyme Q-10 50 MG capsule Take 400 mg by mouth daily.    . colchicine (COLCRYS) 0.6 MG tablet Take 0.6 mg by mouth daily as needed (flare  up).     . escitalopram (LEXAPRO) 10 MG tablet take 1 tablet by mouth once daily 30 tablet 5  . Evolocumab (REPATHA SURECLICK) 628 MG/ML SOAJ Inject 1 pen into the skin every 14 (fourteen) days. 2 pen 11  . ezetimibe (ZETIA) 10 MG tablet Take 1/2 to 1 tablet by  mouth daily. PATIENT IS OVERDUE FOR AN APPT AND MUST CALL AND SCHEDULE FOR FURTHER REFILLS 3RD/FINAL ATTEMPT 15 tablet 0  . fluticasone (FLONASE) 50 MCG/ACT nasal spray Place 1 spray into both nostrils daily. 16 g 2  . gabapentin (NEURONTIN) 100 MG capsule Take 100 mg by mouth daily.  0  . hydrochlorothiazide (HYDRODIURIL) 25 MG tablet Take 2 tablets (50 mg total) by mouth daily. 180 tablet 3  . letrozole (FEMARA) 2.5 MG tablet Take 1 tablet (2.5 mg total) by mouth daily. 90 tablet 4  . levocetirizine (XYZAL) 5 MG tablet take 1 tablet by mouth at bedtime 30 tablet 5  . montelukast (SINGULAIR) 10 MG tablet Take 1 tablet (10 mg total) by mouth at bedtime. 90 tablet 3  . nitrofurantoin, macrocrystal-monohydrate, (MACROBID) 100 MG capsule Take 1 capsule (100 mg total) by mouth 2 (two) times daily. 20 capsule 0  . nystatin cream (MYCOSTATIN) Apply 1 application topically daily as needed for dry skin.   0  . potassium chloride SA (K-DUR,KLOR-CON) 20 MEQ tablet take 1 tablet by mouth once daily 30 tablet 3  . RA ALLERGY RELIEF 180 MG tablet take 1 tablet by mouth once daily 30 tablet 0  . rosuvastatin (CRESTOR) 10 MG tablet Take 1 tablet by mouth twice a week. PATIENT IS OVERDUE FOR AN APPT AND MUST CALL AND SCHEDULE FOR FURTHER REFILLS 3RD/FINAL ATTEMPT 4 tablet 0  . SYNTHROID 50 MCG tablet take 1 tablet by mouth once daily 30 tablet 1  . traMADol (ULTRAM) 50 MG tablet Take 50 mg by mouth every 12 (twelve) hours as needed for moderate pain.     . traMADol-acetaminophen (ULTRACET) 37.5-325 MG tablet Take 1-2 tablets by mouth every 6 (six) hours as needed for moderate pain or severe pain.   0  . triamcinolone (NASACORT AQ) 55 MCG/ACT AERO nasal inhaler Place 2 sprays into the nose daily. 1 Inhaler 12  . vitamin C (ASCORBIC ACID) 500 MG tablet Take 500 mg by mouth daily.    Alveda Reasons 20 MG TABS tablet take 1 tablet by mouth once daily 90 tablet 1   No facility-administered medications prior to visit.          Objective:   Physical Exam Vitals:   02/06/17 1205  BP: 106/80  Pulse: 60  SpO2: 94%  Weight: (!) 320 lb (145.2 kg)  Height: 5' 6.5" (1.689 m)   Gen: Pleasant, Obese woman, in no distress,  normal affect  ENT: No lesions,  mouth clear,  oropharynx clear, no active nasal drip  Neck: No JVD, No stridor-significantly improved  Lungs: No use of accessory muscles, no apparent wheeze, small volumes, No upper airway noise  Cardiovascular: Irregular, distant  Musculoskeletal: No deformities, no cyanosis or clubbing  Neuro: alert, non focal  Skin: Warm, no lesions or rashes      Assessment & Plan:  Asthma Mixed disease on spirometry from today. Restriction from obesity in addition to some mild fixed asthma. She is tolerating Symbicort, uses albuterol less frequently now that her allergic rhinitis is better controlled. Discussed meds, strategies to increase her cardiopulmonary conditioning today.  OSA (obstructive sleep apnea) Continue CPAP as ordered  Allergic rhinitis He had skin testing and is allergic to multiple exposures including dust mites. She's benefited significantly from talking to Dr. Remus Blake, especially with regard to the exposures at her home. Astelin was added to her maintenance allergy regimen and she has benefited. She will continue this. She will follow-up with Dr Remus Blake, discuss any role for immunotherapy depending on her status.   Baltazar Apo, MD, PhD 02/06/2017, 12:44 PM [facility] [phone] or if no answer [phone]

## 2017-02-18 ENCOUNTER — Other Ambulatory Visit: Payer: Self-pay | Admitting: Internal Medicine

## 2017-02-26 DIAGNOSIS — H35372 Puckering of macula, left eye: Secondary | ICD-10-CM | POA: Diagnosis not present

## 2017-02-26 DIAGNOSIS — H26491 Other secondary cataract, right eye: Secondary | ICD-10-CM | POA: Diagnosis not present

## 2017-02-26 DIAGNOSIS — H43813 Vitreous degeneration, bilateral: Secondary | ICD-10-CM | POA: Diagnosis not present

## 2017-02-26 DIAGNOSIS — H40013 Open angle with borderline findings, low risk, bilateral: Secondary | ICD-10-CM | POA: Diagnosis not present

## 2017-02-26 DIAGNOSIS — Z961 Presence of intraocular lens: Secondary | ICD-10-CM | POA: Diagnosis not present

## 2017-03-06 ENCOUNTER — Other Ambulatory Visit: Payer: Self-pay | Admitting: Internal Medicine

## 2017-03-10 ENCOUNTER — Other Ambulatory Visit: Payer: Self-pay | Admitting: *Deleted

## 2017-03-10 NOTE — Telephone Encounter (Signed)
Pt last saw Dr Irish Lack 11/21/15, overdue for 1 year follow-up.  Attempted to call pt to schedule Dr Irish Lack follow-up, [facility] T[facility] for appt.  Last labs 09/17/16 Creat 0.82, weight 145.2kg, age 81, CrCl 127.52, based on CrCl pt is on appropriate dosage of Xarelto 20mg  QD.  Unable to refill rx, as pt is past due for follow-up with Dr Irish Lack and does not have f/u appt scheduled. Will await call back to schedule f/u appt with Dr Irish Lack.

## 2017-03-19 ENCOUNTER — Telehealth: Payer: Self-pay | Admitting: Internal Medicine

## 2017-03-19 ENCOUNTER — Other Ambulatory Visit (INDEPENDENT_AMBULATORY_CARE_PROVIDER_SITE_OTHER): Payer: Medicare Other

## 2017-03-19 ENCOUNTER — Telehealth: Payer: Self-pay | Admitting: Interventional Cardiology

## 2017-03-19 DIAGNOSIS — R0600 Dyspnea, unspecified: Secondary | ICD-10-CM

## 2017-03-19 LAB — CBC WITH DIFFERENTIAL/PLATELET
Basophils Absolute: 0.1 10*3/uL (ref 0.0–0.1)
Basophils Relative: 1.2 % (ref 0.0–3.0)
EOS PCT: 4.3 % (ref 0.0–5.0)
Eosinophils Absolute: 0.3 10*3/uL (ref 0.0–0.7)
HEMATOCRIT: 41.3 % (ref 36.0–46.0)
HEMOGLOBIN: 13.4 g/dL (ref 12.0–15.0)
Lymphocytes Relative: 24.4 % (ref 12.0–46.0)
Lymphs Abs: 1.5 10*3/uL (ref 0.7–4.0)
MCHC: 32.6 g/dL (ref 30.0–36.0)
MCV: 100 fl (ref 78.0–100.0)
MONOS PCT: 9.9 % (ref 3.0–12.0)
Monocytes Absolute: 0.6 10*3/uL (ref 0.1–1.0)
Neutro Abs: 3.8 10*3/uL (ref 1.4–7.7)
Neutrophils Relative %: 60.2 % (ref 43.0–77.0)
Platelets: 151 10*3/uL (ref 150.0–400.0)
RBC: 4.12 Mil/uL (ref 3.87–5.11)
RDW: 14.9 % (ref 11.5–15.5)
WBC: 6.3 10*3/uL (ref 4.0–10.5)

## 2017-03-19 NOTE — Telephone Encounter (Signed)
Called and spoke to patient who is asking for a refill on her atenolol. She states that she has been told that she cannot receive a refill for atenolol unless she has an appointment. Patient is overdue for her yearly follow-up in July. There is an opening in Dr. Hassell Done schedule for tomorrow at 11:40 AM. Patient agreed to come in and be seen. She states that she is also seeing her PCP tomorrow and that she is going to have her labs drawn this afternoon (HFP, Lipids, BMP...). Called and requested that a CBC be added to the labs since the patient is on anticoagulation and that way she can just have her labs drawn all at the same time. They have forwarded the request to Dr. Gwynn Burly nurse.

## 2017-03-19 NOTE — Telephone Encounter (Signed)
New message   Patient does not want to schedule appointment to come in sooner for renewal of medications. Offered sooner appointment with APP, patient declined. No refill request was sent to refill pool based on 10/2 notation from pharmacy. Patient is requesting to speak with a nurse to determine if she should continue to take medication or discontinue.  Patient states she is seeing her PCP on 11/14 for annual.  Pt c/o medication issue:  1. Name of Medication: atenolol (TENORMIN) 25 MG tablet  2. How are you currently taking this medication (dosage and times per day)? As prescribed  3. Are you having a reaction (difficulty breathing--STAT)? NO 4. What is your medication issue?  Patient unsure if she needs to continue medication

## 2017-03-19 NOTE — Telephone Encounter (Signed)
States patient is coming to our lab today for orders for Dr. Jenny Reichmann would like to know if Dr. Jenny Reichmann would add CBC to this for Dr. Irish Lack.

## 2017-03-19 NOTE — Progress Notes (Signed)
Cardiology Office Note   Date:  03/20/2017   ID:  [name], DOB [birth year], MRN [id number]  PCP:  Biagio Borg, MD    No chief complaint on file.  AFib  Wt Readings from Last 3 Encounters:  03/20/17 (!) 329 lb (149.2 kg)  03/20/17 (!) 386 lb (175.1 kg)  02/06/17 (!) 320 lb (145.2 kg)       History of Present Illness: [name] is a 81 y.o. female   who had AFib and CAD. She has had Breast cancer in the recent past.   She had an RCA stent placed in 2000.  Cath report from January 2001. At that time, she had repeat angiography which showed mild to moderate coronary disease in her left system. She had mild in-stent restenosis of the RCA stent.  Denies : Chest pain. Dizziness. Leg edema. Nitroglycerin use. Orthopnea. Palpitations. Paroxysmal nocturnal dyspnea. Shortness of breath. Syncope.   She has DOE.  She uses a walker around the house.    Asthma has been worse.  She feels wheezing.    Past Medical History:  Diagnosis Date  . Allergy   . Anxiety   . Arthritis    no cartlidge in knees  . Asthma   . Asthma 03/24/2015  . Atrial fibrillation ([place]) 01/21/11  . Breast cancer ([place]) Receptor + her2 _ 12/26/2010   left  . COPD (chronic obstructive pulmonary disease) ([place])   . Essential hypertension 03/24/2015  . Gout    post operative  . Gout 03/24/2015  . Hx of radiation therapy 04/02/11 -05/20/11   left breast  . Hypothyroidism 03/24/2015  . Incontinence of urine   . Malignant neoplasm of upper-outer quadrant of female breast ([place]) 12/26/2010  . OSA (obstructive sleep apnea) 03/24/2015  . Osteoporosis 03/24/2015  . Peripheral neuropathy 03/24/2015  . Plantar fasciitis   . Sleep apnea     Past Surgical History:  Procedure Laterality Date  . BREAST LUMPECTOMY W/ NEEDLE LOCALIZATION  01/29/2011   Left with [facility] Dr Margot Chimes  . CHOLECYSTECTOMY  1955  . CORONARY ANGIOPLASTY WITH STENT PLACEMENT  2000  . DILATION AND CURETTAGE OF UTERUS  1976   and 1996  .  HAND SURGERY  1992   tendons between thumb and forefinger  . SKIN BIOPSY     1994, 2006, 2008, 2010, 2011, 2011, 2012-  pre-cancerous  . TONSILLECTOMY  1955     Current Outpatient Medications  Medication Sig Dispense Refill  . albuterol (PROVENTIL) (2.5 MG/3ML) 0.083% nebulizer solution Take 3 mLs (2.5 mg total) by nebulization every 6 (six) hours as needed for wheezing or shortness of breath. 75 mL 12  . allopurinol (ZYLOPRIM) 100 MG tablet Take 300 mg by mouth daily.     Marland Kitchen ALPRAZolam (XANAX) 0.5 MG tablet TAKE 1 TO 2 TABLETS BY MOUTH TWICE DAILY IF NEEDED FOR ANXIETY/SLEEP 60 tablet 3  . atenolol (TENORMIN) 25 MG tablet Take 1 tablet (25 mg total) by mouth daily. PATIENT IS OVERDUE FOR AN APPT AND MUST CALL AND SCHEDULE FOR FURTHER REFILLS 3RD/FINAL ATTEMPT 15 tablet 0  . azelastine (ASTELIN) 0.1 % nasal spray Place 2 sprays into both nostrils daily. Use in each nostril as directed    . azelastine (OPTIVAR) 0.05 % ophthalmic solution Place 1 drop into both eyes 2 (two) times daily. 6 mL 12  . Biotin 5000 MCG CAPS Take 5,000 mcg by mouth daily.     . budesonide-formoterol (SYMBICORT) 160-4.5 MCG/ACT inhaler Inhale  2 puffs 2 (two) times daily into the lungs.    Marland Kitchen buPROPion (WELLBUTRIN XL) 150 MG 24 hr tablet Take 1 tablet (150 mg total) by mouth daily. 90 tablet 3  . cholecalciferol (VITAMIN D) 1000 UNITS tablet Take 1,000 Units by mouth daily.    Marland Kitchen co-enzyme Q-10 50 MG capsule Take 400 mg by mouth daily.    . colchicine (COLCRYS) 0.6 MG tablet Take 0.6 mg by mouth daily as needed (flare  up).     . escitalopram (LEXAPRO) 10 MG tablet take 1 tablet by mouth once daily 30 tablet 5  . Evolocumab (REPATHA SURECLICK) 539 MG/ML SOAJ Inject 1 pen into the skin every 14 (fourteen) days. 2 pen 11  . ezetimibe (ZETIA) 10 MG tablet Take 1/2 to 1 tablet by mouth daily. PATIENT IS OVERDUE FOR AN APPT AND MUST CALL AND SCHEDULE FOR FURTHER REFILLS 3RD/FINAL ATTEMPT 15 tablet 0  . gabapentin (NEURONTIN)  100 MG capsule Take 1 capsule (100 mg total) daily by mouth. 90 capsule 3  . hydrochlorothiazide (HYDRODIURIL) 25 MG tablet Take 2 tablets (50 mg total) by mouth daily. 180 tablet 3  . levocetirizine (XYZAL) 5 MG tablet take 1 tablet by mouth at bedtime 30 tablet 5  . montelukast (SINGULAIR) 10 MG tablet Take 10 mg daily by mouth.  0  . nitrofurantoin, macrocrystal-monohydrate, (MACROBID) 100 MG capsule Take 1 capsule (100 mg total) by mouth 2 (two) times daily. 20 capsule 0  . nystatin cream (MYCOSTATIN) Apply 1 application topically daily as needed for dry skin.   0  . potassium chloride SA (K-DUR,KLOR-CON) 20 MEQ tablet take 1 tablet by mouth once daily 30 tablet 3  . PROAIR HFA 108 (90 Base) MCG/ACT inhaler INHALE 2 PUFFS BY MOUTH INTO THE LUNGS AS NEEDED FOR WHEEZING/SHORTNESS OF BREATH 17 g 3  . RA ALLERGY RELIEF 180 MG tablet take 1 tablet by mouth once daily 30 tablet 5  . rosuvastatin (CRESTOR) 10 MG tablet Take 1 tablet by mouth twice a week. PATIENT IS OVERDUE FOR AN APPT AND MUST CALL AND SCHEDULE FOR FURTHER REFILLS 3RD/FINAL ATTEMPT 4 tablet 0  . SYNTHROID 50 MCG tablet take 1 tablet by mouth once daily 30 tablet 5  . traMADol (ULTRAM) 50 MG tablet Take 50 mg by mouth every 12 (twelve) hours as needed for moderate pain.     . traMADol-acetaminophen (ULTRACET) 37.5-325 MG tablet Take 1-2 tablets by mouth every 6 (six) hours as needed for moderate pain or severe pain.   0  . triamcinolone (NASACORT AQ) 55 MCG/ACT AERO nasal inhaler Place 2 sprays daily into the nose. 1 Inhaler 12  . vitamin C (ASCORBIC ACID) 500 MG tablet Take 500 mg by mouth daily.    Alveda Reasons 20 MG TABS tablet take 1 tablet by mouth once daily 90 tablet 1  . Zoster Vaccine Adjuvanted [facility]) injection Inject 0.5 mLs once for 1 dose into the muscle. 0.5 mL 1   No current facility-administered medications for this visit.     Allergies:   Crestor [rosuvastatin calcium]; Hydrocodone-acetaminophen; Lidocaine hcl;  Lipitor [atorvastatin calcium]; Procaine; Rosuvastatin; Theophylline; Vicodin [hydrocodone-acetaminophen]; Codeine; Oxycodone-acetaminophen; Propoxyphene; Quinine derivatives; Sulfa antibiotics; Atorvastatin; Ephedrine; Cephalexin; Epinephrine; Penicillins; and Theophyllines    Social History:  The patient  reports that she quit smoking about 54 years ago. She has a 4.00 pack-year smoking history. she has never used smokeless tobacco. She reports that she does not drink alcohol or use drugs.   Family History:  The  patient's family history includes Asthma in her sister; Cancer in her father; Heart attack in her brother and father; Heart disease in her brother and father.    ROS:  Please see the history of present illness.   Otherwise, review of systems are positive for DOE; allergies and asthma sx.   All other systems are reviewed and negative.    PHYSICAL EXAM: VS:  BP 130/64   Pulse (!) 41   Ht 5' 6.5" (1.689 m)   Wt (!) 329 lb (149.2 kg)   SpO2 95%   BMI 52.31 kg/m  , BMI Body mass index is 52.31 kg/m. GEN: Well nourished, well developed, in no acute distress  HEENT: normal  Neck: no JVD, carotid bruits, or masses Cardiac: RRR; no murmurs, rubs, or gallops,no edema  Respiratory:  clear to auscultation bilaterally, normal work of breathing GI: soft, nontender, nondistended, + BS MS: no deformity or atrophy  Skin: warm and dry, no rash Neuro:  Strength and sensation are intact Psych: euthymic mood, full affect   EKG:   The ekg ordered today demonstrates AFib, slow ventricular response   Recent Labs: 09/17/2016: ALT 20; BUN 17; Creatinine, Ser 0.82; Potassium 3.8; Pro B Natriuretic peptide (BNP) 100.0; Sodium 141; TSH 5.76 03/19/2017: Hemoglobin 13.4; Platelets 151.0   Lipid Panel    Component Value Date/Time   CHOL 200 09/17/2016 1221   TRIG 242.0 (H) 09/17/2016 1221   HDL 49.50 09/17/2016 1221   CHOLHDL 4 09/17/2016 1221   VLDL 48.4 (H) 09/17/2016 1221   LDLCALC 110  11/21/2015 1036   LDLDIRECT 107.0 09/17/2016 1221     Other studies Reviewed: Additional studies/ records that were reviewed today with results demonstrating: labs reviewed .   ASSESSMENT AND PLAN:  1. AFib : Rate controlled. Xarelto for stroke prevention.  Stop atenolol due to bradycardia.  If HR increased at next check, could add back 12.5 mg daily. 2. CAD: Prior RCA stent.   No angina on medical therapy. 3. Hyperlipidemia: LDL slightly above target. COntinue rosuvastatin. COntinue Zetia.   4. DOE: Exacerbated by asthma.  Using nebulizer.  Try to increase exercise to help lose weight, morbid obesity.    Current medicines are reviewed at length with the patient today.  The patient concerns regarding her medicines were addressed.  The following changes have been made:  No change  Labs/ tests ordered today include:  No orders of the defined types were placed in this encounter.   Recommend 150 minutes/week of aerobic exercise Low fat, low carb, high fiber diet recommended  Disposition:   FU in 2 weeks for ECG   Signed, Larae Grooms, MD  03/20/2017 12:08 PM    [facility] [address], [place], [place]  [address] Phone: [phone]; Fax: [phone]

## 2017-03-19 NOTE — Telephone Encounter (Deleted)
New message     *STAT* If patient is at the pharmacy, call can be transferred to refill team.   1. Which medications need to be refilled? (please list name of each medication and dose if known) *****  2. Which pharmacy/location (including street and city if local pharmacy) is medication to be sent to?****  3. Do they need a 30 day or 90 day supply? ***

## 2017-03-19 NOTE — Telephone Encounter (Signed)
Ok, this is added

## 2017-03-20 ENCOUNTER — Ambulatory Visit (INDEPENDENT_AMBULATORY_CARE_PROVIDER_SITE_OTHER): Payer: Medicare Other | Admitting: Internal Medicine

## 2017-03-20 ENCOUNTER — Encounter: Payer: Self-pay | Admitting: Internal Medicine

## 2017-03-20 ENCOUNTER — Ambulatory Visit: Payer: Medicare Other | Admitting: Interventional Cardiology

## 2017-03-20 ENCOUNTER — Other Ambulatory Visit: Payer: Self-pay | Admitting: Internal Medicine

## 2017-03-20 ENCOUNTER — Other Ambulatory Visit (INDEPENDENT_AMBULATORY_CARE_PROVIDER_SITE_OTHER): Payer: Medicare Other

## 2017-03-20 ENCOUNTER — Encounter: Payer: Self-pay | Admitting: Interventional Cardiology

## 2017-03-20 VITALS — BP 130/64 | HR 41 | Ht 66.5 in | Wt 329.0 lb

## 2017-03-20 VITALS — BP 132/86 | HR 58 | Temp 97.7°F | Ht 66.5 in | Wt 386.0 lb

## 2017-03-20 DIAGNOSIS — I2510 Atherosclerotic heart disease of native coronary artery without angina pectoris: Secondary | ICD-10-CM | POA: Diagnosis not present

## 2017-03-20 DIAGNOSIS — I482 Chronic atrial fibrillation, unspecified: Secondary | ICD-10-CM

## 2017-03-20 DIAGNOSIS — I10 Essential (primary) hypertension: Secondary | ICD-10-CM | POA: Diagnosis not present

## 2017-03-20 DIAGNOSIS — R739 Hyperglycemia, unspecified: Secondary | ICD-10-CM | POA: Diagnosis not present

## 2017-03-20 DIAGNOSIS — E039 Hypothyroidism, unspecified: Secondary | ICD-10-CM

## 2017-03-20 DIAGNOSIS — Z23 Encounter for immunization: Secondary | ICD-10-CM

## 2017-03-20 DIAGNOSIS — Z6841 Body Mass Index (BMI) 40.0 and over, adult: Secondary | ICD-10-CM

## 2017-03-20 DIAGNOSIS — E782 Mixed hyperlipidemia: Secondary | ICD-10-CM

## 2017-03-20 DIAGNOSIS — R001 Bradycardia, unspecified: Secondary | ICD-10-CM | POA: Diagnosis not present

## 2017-03-20 DIAGNOSIS — E785 Hyperlipidemia, unspecified: Secondary | ICD-10-CM

## 2017-03-20 DIAGNOSIS — Z0000 Encounter for general adult medical examination without abnormal findings: Secondary | ICD-10-CM

## 2017-03-20 DIAGNOSIS — M1A9XX0 Chronic gout, unspecified, without tophus (tophi): Secondary | ICD-10-CM

## 2017-03-20 LAB — LIPID PANEL
CHOL/HDL RATIO: 4
Cholesterol: 215 mg/dL — ABNORMAL HIGH (ref 0–200)
HDL: 51.7 mg/dL (ref >39.00)
LDL CALC: 129 mg/dL — AB (ref 0–99)
NONHDL: 163
Triglycerides: 171 mg/dL — ABNORMAL HIGH (ref 0.0–149.0)
VLDL: 34.2 mg/dL (ref 0.0–40.0)

## 2017-03-20 LAB — HEPATIC FUNCTION PANEL
ALT: 15 U/L (ref 0–35)
AST: 18 U/L (ref 0–37)
Albumin: 3.8 g/dL (ref 3.5–5.2)
Alkaline Phosphatase: 55 U/L (ref 39–117)
Bilirubin, Direct: 0.2 mg/dL (ref 0.0–0.3)
TOTAL PROTEIN: 6.2 g/dL (ref 6.0–8.3)
Total Bilirubin: 1.2 mg/dL (ref 0.2–1.2)

## 2017-03-20 LAB — BASIC METABOLIC PANEL
BUN: 19 mg/dL (ref 6–23)
CALCIUM: 9.6 mg/dL (ref 8.4–10.5)
CHLORIDE: 104 meq/L (ref 96–112)
CO2: 32 mEq/L (ref 19–32)
CREATININE: 0.82 mg/dL (ref 0.40–1.20)
GFR: 71.37 mL/min (ref >60.00)
Glucose, Bld: 133 mg/dL — ABNORMAL HIGH (ref 70–99)
Potassium: 3.3 mEq/L — ABNORMAL LOW (ref 3.5–5.1)
Sodium: 141 mEq/L (ref 135–145)

## 2017-03-20 LAB — HEMOGLOBIN A1C: Hgb A1c MFr Bld: 6.1 % (ref 4.6–6.5)

## 2017-03-20 LAB — T4, FREE: Free T4: 0.92 ng/dL (ref 0.60–1.60)

## 2017-03-20 LAB — URIC ACID: URIC ACID, SERUM: 4.1 mg/dL (ref 2.4–7.0)

## 2017-03-20 LAB — TSH: TSH: 5.04 u[IU]/mL — ABNORMAL HIGH (ref 0.35–4.50)

## 2017-03-20 MED ORDER — ZOSTER VAC RECOMB ADJUVANTED 50 MCG/0.5ML IM SUSR: 0.5000 mL | Freq: Once | INTRAMUSCULAR | 1 refills | Status: AC

## 2017-03-20 MED ORDER — GABAPENTIN 100 MG PO CAPS: 100.0000 mg | ORAL_CAPSULE | Freq: Every day | ORAL | 3 refills | Status: DC

## 2017-03-20 MED ORDER — LEVOTHYROXINE SODIUM 75 MCG PO TABS: 75.0000 ug | ORAL_TABLET | Freq: Every day | ORAL | 3 refills | Status: DC

## 2017-03-20 MED ORDER — TRIAMCINOLONE ACETONIDE 55 MCG/ACT NA AERO: 2.0000 | INHALATION_SPRAY | Freq: Every day | NASAL | 12 refills | Status: DC

## 2017-03-20 MED ORDER — POTASSIUM CHLORIDE ER 10 MEQ PO TBCR: 10.0000 meq | EXTENDED_RELEASE_TABLET | Freq: Every day | ORAL | 3 refills | Status: DC

## 2017-03-20 NOTE — Patient Instructions (Signed)
Medication Instructions:  Your physician has recommended you make the following change in your medication:   STOP atenolol  Labwork: None ordered  Testing/Procedures: None ordered  Follow-Up: Your physician recommends that you schedule a follow-up appointment in: 2 weeks for a Nurse Visit EKG or with APP on Dr. Hassell Done team.    Any Other Special Instructions Will Be Listed Below (If Applicable).     If you need a refill on your cardiac medications before your next appointment, please call your pharmacy.

## 2017-03-20 NOTE — Patient Instructions (Addendum)
You had the flu shot today  You had the Prevnar 13 pneumonia shot  Your Shingles shot was sent to the pharmacy  Please continue all other medications as before, and refills have been done if requested.  Please have the pharmacy call with any other refills you may need.  Please continue your efforts at being more active, low cholesterol diet, and weight control.  Please keep your appointments with your specialists as you may have planned  Please return in 6 months, or sooner if needed, with Lab testing done 3-5 days before

## 2017-03-20 NOTE — Progress Notes (Signed)
Subjective:    Patient ID: [name], female    DOB: 1936-03-11, 81 y.o.   MRN: [id number]  HPI Here to f/u; overall doing ok,  Pt denies chest pain, increasing sob or doe, wheezing, orthopnea, PND, increased LE swelling, palpitations, dizziness or syncope.  Pt denies new neurological symptoms such as new headache, or facial or extremity weakness or numbness.  Pt denies polydipsia, polyuria,   Pt states overall good compliance with meds, mostly trying to follow appropriate diet, with wt overall stable,  but little exercise however.  Off fosamax since has been over 5 yrs.  On new Repatha for HLD.  Seeing Dr Lamonte Sakai for pulm  Denies hyper or hypo thyroid symptoms such as voice, skin or hair change.  No new complaints or other interval hx Past Medical History:  Diagnosis Date  . Allergy   . Anxiety   . Arthritis    no cartlidge in knees  . Asthma   . Asthma 03/24/2015  . Atrial fibrillation ([place]) 01/21/11  . 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 COPD (chronic obstructive pulmonary disease) ([place])   . Essential hypertension 03/24/2015  . Gout    post operative  . Gout 03/24/2015  . Hx of radiation therapy 04/02/11 -05/20/11   left breast  . Hypothyroidism 03/24/2015  . Incontinence of urine   . Malignant neoplasm of upper-outer quadrant of female breast ([place]) 12/26/2010  . OSA (obstructive sleep apnea) 03/24/2015  . Osteoporosis 03/24/2015  . Peripheral neuropathy 03/24/2015  . Plantar fasciitis   . Sleep apnea    Past Surgical History:  Procedure Laterality Date  . BREAST LUMPECTOMY W/ NEEDLE LOCALIZATION  01/29/2011   Left with [facility] Dr Margot Chimes  . CHOLECYSTECTOMY  1955  . CORONARY ANGIOPLASTY WITH STENT PLACEMENT  2000  . DILATION AND CURETTAGE OF UTERUS  1976   and 1996  . HAND SURGERY  1992   tendons between thumb and forefinger  . SKIN BIOPSY     1994, 2006, 2008, 2010, 2011, 2011, 2012-  pre-cancerous  . TONSILLECTOMY  1955    reports that she quit  smoking about 54 years ago. She has a 4.00 pack-year smoking history. she has never used smokeless tobacco. She reports that she does not drink alcohol or use drugs. family history includes Asthma in her sister; Cancer in her father; Heart attack in her brother and father; Heart disease in her brother and father. Allergies  Allergen Reactions  . Crestor [Rosuvastatin Calcium]     Severe liver function problems  . Hydrocodone-Acetaminophen Anxiety and Itching  . Lidocaine Hcl Other (See Comments)    Novacaine:  Becomes very shaky  . Lipitor [Atorvastatin Calcium] Other (See Comments)    Elevated liver function   . Procaine Other (See Comments)    shaking  . Rosuvastatin Other (See Comments)    Severe liver function problems  . Theophylline Other (See Comments)    Becomes very shaky skaking  . Vicodin [Hydrocodone-Acetaminophen] Itching    Itching all over  . Codeine Nausea Only, Anxiety and Other (See Comments)    Also complained of dizziness.  Has same problems with oxycodone and hydrocodone Visual Disturbance, Balance Difficulty, Dizzy "Room Spinning; "Swimming" Balance, vision, nausea, dizzy, "swimming", "room spinning"  . Oxycodone-Acetaminophen Nausea Only, Anxiety and Other (See Comments)    Visual Disturbance, Balance Difficulty, Dizzy "Room Spinning; "Swimming"  . Propoxyphene Anxiety, Nausea Only and Other (See Comments)    Visual Disturbance,  Balance Difficulty, Dizzy "Room Spinning; "Swimming"  . Quinine Derivatives Nausea Only    dizzy  . Sulfa Antibiotics Nausea Only    Also complained of dizziness  . Atorvastatin Other (See Comments)    Severe liver function problems  . Ephedrine Other (See Comments)    shaking  . Cephalexin Rash  . Epinephrine Other (See Comments)    Patient becomes "shaky" shaking  . Penicillins Rash    Pt reported all over body rash in 1958 Has patient had a PCN reaction causing immediate rash, facial/tongue/throat swelling, SOB or  lightheadedness with hypotension:Yes Has patient had a PCN reaction causing severe rash involving mucus membranes or skin necrosis: No Has patient had a PCN reaction that required hospitalization: No Has patient had a PCN reaction occurring within the last 10 years:No    . Theophyllines Other (See Comments)    Patient becomes "shaky"   Current Outpatient Medications on File Prior to Visit  Medication Sig Dispense Refill  . albuterol (PROVENTIL) (2.5 MG/3ML) 0.083% nebulizer solution Take 3 mLs (2.5 mg total) by nebulization every 6 (six) hours as needed for wheezing or shortness of breath. 75 mL 12  . allopurinol (ZYLOPRIM) 100 MG tablet Take 300 mg by mouth daily.     Marland Kitchen ALPRAZolam (XANAX) 0.5 MG tablet TAKE 1 TO 2 TABLETS BY MOUTH TWICE DAILY IF NEEDED FOR ANXIETY/SLEEP 60 tablet 3  . azelastine (ASTELIN) 0.1 % nasal spray Place 2 sprays into both nostrils daily. Use in each nostril as directed    . azelastine (OPTIVAR) 0.05 % ophthalmic solution Place 1 drop into both eyes 2 (two) times daily. 6 mL 12  . Biotin 5000 MCG CAPS Take 5,000 mcg by mouth daily.     . budesonide-formoterol (SYMBICORT) 160-4.5 MCG/ACT inhaler Inhale 2 puffs 2 (two) times daily into the lungs.    Marland Kitchen buPROPion (WELLBUTRIN XL) 150 MG 24 hr tablet Take 1 tablet (150 mg total) by mouth daily. 90 tablet 3  . cholecalciferol (VITAMIN D) 1000 UNITS tablet Take 1,000 Units by mouth daily.    Marland Kitchen co-enzyme Q-10 50 MG capsule Take 400 mg by mouth daily.    . colchicine (COLCRYS) 0.6 MG tablet Take 0.6 mg by mouth daily as needed (flare  up).     . escitalopram (LEXAPRO) 10 MG tablet take 1 tablet by mouth once daily 30 tablet 5  . Evolocumab (REPATHA SURECLICK) 638 MG/ML SOAJ Inject 1 pen into the skin every 14 (fourteen) days. 2 pen 11  . ezetimibe (ZETIA) 10 MG tablet Take 1/2 to 1 tablet by mouth daily. PATIENT IS OVERDUE FOR AN APPT AND MUST CALL AND SCHEDULE FOR FURTHER REFILLS 3RD/FINAL ATTEMPT 15 tablet 0  .  hydrochlorothiazide (HYDRODIURIL) 25 MG tablet Take 2 tablets (50 mg total) by mouth daily. 180 tablet 3  . levocetirizine (XYZAL) 5 MG tablet take 1 tablet by mouth at bedtime 30 tablet 5  . nitrofurantoin, macrocrystal-monohydrate, (MACROBID) 100 MG capsule Take 1 capsule (100 mg total) by mouth 2 (two) times daily. 20 capsule 0  . nystatin cream (MYCOSTATIN) Apply 1 application topically daily as needed for dry skin.   0  . PROAIR HFA 108 (90 Base) MCG/ACT inhaler INHALE 2 PUFFS BY MOUTH INTO THE LUNGS AS NEEDED FOR WHEEZING/SHORTNESS OF BREATH 17 g 3  . RA ALLERGY RELIEF 180 MG tablet take 1 tablet by mouth once daily 30 tablet 5  . rosuvastatin (CRESTOR) 10 MG tablet Take 1 tablet by mouth twice a week.  PATIENT IS OVERDUE FOR AN APPT AND MUST CALL AND SCHEDULE FOR FURTHER REFILLS 3RD/FINAL ATTEMPT 4 tablet 0  . traMADol (ULTRAM) 50 MG tablet Take 50 mg by mouth every 12 (twelve) hours as needed for moderate pain.     . traMADol-acetaminophen (ULTRACET) 37.5-325 MG tablet Take 1-2 tablets by mouth every 6 (six) hours as needed for moderate pain or severe pain.   0  . vitamin C (ASCORBIC ACID) 500 MG tablet Take 500 mg by mouth daily.    Alveda Reasons 20 MG TABS tablet take 1 tablet by mouth once daily 90 tablet 1   No current facility-administered medications on file prior to visit.    Review of Systems  Constitutional: Negative for other unusual diaphoresis or sweats HENT: Negative for ear discharge or swelling Eyes: Negative for other worsening visual disturbances Respiratory: Negative for stridor or other swelling  Gastrointestinal: Negative for worsening distension or other blood Genitourinary: Negative for retention or other urinary change Musculoskeletal: Negative for other MSK pain or swelling Skin: Negative for color change or other new lesions Neurological: Negative for worsening tremors and other numbness  Psychiatric/Behavioral: Negative for worsening agitation or other  fatigue All other ystem neg per pt    Objective:   Physical Exam BP 132/86   Pulse (!) 58   Temp 97.7 F (36.5 C) (Oral)   Ht 5' 6.5" (1.689 m)   Wt (!) 386 lb (175.1 kg) Comment: with wheel chair 324lb without wheelchair  SpO2 98%   BMI 61.37 kg/m  VS noted, obese, examined in wheelchair Constitutional: Pt appears in NAD HENT: Head: NCAT.  Right Ear: External ear normal.  Left Ear: External ear normal.  Eyes: . Pupils are equal, round, and reactive to light. Conjunctivae and EOM are normal Nose: without d/c or deformity Neck: Neck supple. Gross normal ROM Cardiovascular: Normal rate and regular rhythm.   Pulmonary/Chest: Effort normal and breath sounds without rales or wheezing.  Abd:  Soft, NT, ND, + BS, no organomegaly Neurological: Pt is alert. At baseline orientation, motor grossly intact Skin: Skin is warm. No rashes, other new lesions, trace bilat LE edema Psychiatric: Pt behavior is normal without agitation  No other exam findings    Assessment & Plan:

## 2017-03-21 ENCOUNTER — Telehealth: Payer: Self-pay

## 2017-03-21 NOTE — Telephone Encounter (Signed)
Pt wants to give a cheer to [employer]. Pt states that Redmond Baseman is very professional and so happy that she changed to [place] from [place].   Pt informed of results.

## 2017-03-23 NOTE — Assessment & Plan Note (Addendum)
stable overall by history and exam, recent data reviewed with pt, and pt to continue medical treatment as before,  to f/u any worsening symptoms or concerns, for f/u lab today 

## 2017-03-23 NOTE — Assessment & Plan Note (Signed)
stable overall by history and exam, recent data reviewed with pt, and pt to continue medical treatment as before,  to f/u any worsening symptoms or concerns Lab Results  Component Value Date   HGBA1C 6.1 03/20/2017  stable overall by history and exam, recent data reviewed with pt, and pt to continue medical treatment as before,  to f/u any worsening symptoms or concerns

## 2017-03-23 NOTE — Assessment & Plan Note (Signed)
stable overall by history and exam, recent data reviewed with pt, and pt to continue medical treatment as before,  to f/u any worsening symptoms or concerns, for f/u lab today 

## 2017-03-23 NOTE — Assessment & Plan Note (Signed)
BP Readings from Last 3 Encounters:  03/20/17 130/64  03/20/17 132/86  02/06/17 106/80  stable overall by history and exam, recent data reviewed with pt, and pt to continue medical treatment as before,  to f/u any worsening symptoms or concerns

## 2017-03-24 DIAGNOSIS — M1711 Unilateral primary osteoarthritis, right knee: Secondary | ICD-10-CM | POA: Diagnosis not present

## 2017-03-24 DIAGNOSIS — M1712 Unilateral primary osteoarthritis, left knee: Secondary | ICD-10-CM | POA: Diagnosis not present

## 2017-03-25 MED ORDER — RIVAROXABAN 20 MG PO TABS: 20.0000 mg | ORAL_TABLET | Freq: Every day | ORAL | 1 refills | Status: DC

## 2017-03-31 ENCOUNTER — Other Ambulatory Visit: Payer: Self-pay

## 2017-03-31 MED ORDER — EZETIMIBE 10 MG PO TABS: ORAL_TABLET | ORAL | 11 refills | Status: DC

## 2017-03-31 MED ORDER — ROSUVASTATIN CALCIUM 10 MG PO TABS: ORAL_TABLET | ORAL | 3 refills | Status: DC

## 2017-04-03 DIAGNOSIS — G4733 Obstructive sleep apnea (adult) (pediatric): Secondary | ICD-10-CM | POA: Diagnosis not present

## 2017-04-04 ENCOUNTER — Other Ambulatory Visit: Payer: Self-pay | Admitting: Internal Medicine

## 2017-04-10 ENCOUNTER — Encounter: Payer: Self-pay | Admitting: Physician Assistant

## 2017-04-10 ENCOUNTER — Ambulatory Visit: Payer: Medicare Other | Admitting: Physician Assistant

## 2017-04-10 VITALS — BP 132/80 | HR 79 | Ht 66.5 in | Wt 319.8 lb

## 2017-04-10 DIAGNOSIS — I2510 Atherosclerotic heart disease of native coronary artery without angina pectoris: Secondary | ICD-10-CM

## 2017-04-10 DIAGNOSIS — E785 Hyperlipidemia, unspecified: Secondary | ICD-10-CM

## 2017-04-10 DIAGNOSIS — I482 Chronic atrial fibrillation, unspecified: Secondary | ICD-10-CM

## 2017-04-10 DIAGNOSIS — R001 Bradycardia, unspecified: Secondary | ICD-10-CM | POA: Diagnosis not present

## 2017-04-10 MED ORDER — METOPROLOL SUCCINATE ER 25 MG PO TB24: 12.5000 mg | ORAL_TABLET | Freq: Every day | ORAL | 1 refills | Status: DC

## 2017-04-10 NOTE — Progress Notes (Signed)
Cardiology Office Note    Date:  04/10/2017  ID:  [name], DOB 03/18/1936, MRN [id number] PCP:  Biagio Borg, MD  Cardiologist:  Dr. Irish Lack   Chief Complaint: f/u slow HR  History of Present Illness:  [name] is a 81 y.o. female with history of chronic atrial fib, morbid obesity, CAD s/p RCA stent 2000 with residual mild-mod disease of left system 2001, hyperlipidemia, asthma, breast CA, anxiety, arthritis, HTN, gout, OSA, hypothyroidism who presents to f/u bradycardia. Last cath 2001 showed 50% mD1, 20-25% LCx, 20-25% ISR of RCA stent. Last nuc 01/2011 showed small partially reversible defect in anterolateral area, could represent ischemia but could be due to breast attenuation, EF 67%.  She was recently seen in clinic 03/20/17 for routine f/u and was noted to bradycardic at 41bpm (AF) so atenolol was stopped. Most recent labs 03/20/17 showed K 3.3, Cr 0.82, LDL 129, Hgb 13.4, normal LFTs, TSH 5.04 prompting titration of KCl and levothyroxine by PCP.  She presents back for follow-up feeling overall much better. Asthma has been less bothersome. Dyspnea improved. She does have chronic DOE with ambulation likely due to her poor functional capacity as she is limited by chronic knee problems. She previously lost 80lb last year down to 269 but gained it all back when she fell off the proverbial wagon. She has noticed since stopping atenolol that she will feel her heart pound faster when she does activity.   Past Medical History:  Diagnosis Date  . Allergy   . Anxiety   . Arthritis    no cartlidge in knees  . Asthma   . Bradycardia    a. atenolol stopped due to this, HR 40s.  . Breast cancer ([place]) Receptor + her2 _ 12/26/2010   left  . CAD in native artery    a.  CAD s/p RCA stent 2000 with residual mild-mod disease of left system 2001.  Marland Kitchen Chronic atrial fibrillation ([place])   . COPD (chronic obstructive pulmonary disease) ([place])   . Essential hypertension 03/24/2015  . Gout    post operative  . Hx of radiation therapy 04/02/11 -05/20/11   left breast  . Hyperlipidemia   . Hypothyroidism 03/24/2015  . Incontinence of urine   . Malignant neoplasm of upper-outer quadrant of female breast ([place]) 12/26/2010  . Osteoporosis 03/24/2015  . Peripheral neuropathy 03/24/2015  . Plantar fasciitis   . Sleep apnea     Past Surgical History:  Procedure Laterality Date  . BREAST LUMPECTOMY W/ NEEDLE LOCALIZATION  01/29/2011   Left with [facility] Dr Margot Chimes  . CHOLECYSTECTOMY  1955  . CORONARY ANGIOPLASTY WITH STENT PLACEMENT  2000  . DILATION AND CURETTAGE OF UTERUS  1976   and 1996  . HAND SURGERY  1992   tendons between thumb and forefinger  . SKIN BIOPSY     1994, 2006, 2008, 2010, 2011, 2011, 2012-  pre-cancerous  . TONSILLECTOMY  1955    Current Medications: No outpatient medications have been marked as taking for the 04/10/17 encounter (Office Visit) with Charlie Pitter, PA-C.     Allergies:   Crestor [rosuvastatin calcium]; Hydrocodone-acetaminophen; Lidocaine hcl; Lipitor [atorvastatin calcium]; Procaine; Rosuvastatin; Theophylline; Vicodin [hydrocodone-acetaminophen]; Codeine; Oxycodone-acetaminophen; Propoxyphene; Quinine derivatives; Sulfa antibiotics; Atorvastatin; Ephedrine; Cephalexin; Epinephrine; Penicillins; and Theophyllines   Social History   Socioeconomic History  . Marital status: Married    Spouse name: None  . Number of children: None  . Years of education: None  . Highest education level:  None  Social Needs  . Financial resource strain: None  . Food insecurity - worry: None  . Food insecurity - inability: None  . Transportation needs - medical: None  . Transportation needs - non-medical: None  Occupational History  . None  Tobacco Use  . Smoking status: Former Smoker    Packs/day: 1.00    Years: 4.00    Pack years: 4.00    Last attempt to quit: 12/05/1962    Years since quitting: 54.3  . Smokeless tobacco: Never Used  Substance and  Sexual Activity  . Alcohol use: No  . Drug use: No  . Sexual activity: None  Other Topics Concern  . None  Social History Narrative  . None     Family History:  Family History  Problem Relation Age of Onset  . Heart disease Father   . Cancer Father        intestinal polyps  . Heart attack Father   . Asthma Mercedes   . Heart disease Brother   . Heart attack Brother     ROS:   Please see the history of present illness.  All other systems are reviewed and otherwise negative.    PHYSICAL EXAM:   VS:  BP 132/80   Pulse 79   Ht 5' 6.5" (1.689 m)   Wt (!) 319 lb 12.8 oz (145.1 kg)   SpO2 92%   BMI 50.84 kg/m   BMI: Body mass index is 50.84 kg/m. GEN: Well nourished, well developed morbidly obese WF, in no acute distress  HEENT: normocephalic, atraumatic Neck: no JVD, carotid bruits, or masses Cardiac: irregularly irregular, rate controlled; no murmurs, rubs, or gallops, no edema  Respiratory:  clear to auscultation bilaterally, normal work of breathing GI: soft, nontender, nondistended, + BS MS: no deformity or atrophy  Skin: warm and dry, no rash Neuro:  Alert and Oriented x 3, Strength and sensation are intact, follows commands Psych: euthymic mood, full affect  Wt Readings from Last 3 Encounters:  04/10/17 (!) 319 lb 12.8 oz (145.1 kg)  03/20/17 (!) 329 lb (149.2 kg)  03/20/17 (!) 386 lb (175.1 kg)      Studies/Labs Reviewed:   EKG:  EKG was ordered today and personally reviewed by me and demonstrates atrial fibrillation 73bpm, nonspecific ST-T changes  Recent Labs: 09/17/2016: Pro B Natriuretic peptide (BNP) 100.0 03/19/2017: Hemoglobin 13.4; Platelets 151.0 03/20/2017: ALT 15; BUN 19; Creatinine, Ser 0.82; Potassium 3.3; Sodium 141; TSH 5.04   Lipid Panel    Component Value Date/Time   CHOL 215 (H) 03/20/2017 1020   TRIG 171.0 (H) 03/20/2017 1020   HDL 51.70 03/20/2017 1020   CHOLHDL 4 03/20/2017 1020   VLDL 34.2 03/20/2017 1020   LDLCALC 129 (H)  03/20/2017 1020   LDLDIRECT 107.0 09/17/2016 1221    Additional studies/ records that were reviewed today include: Summarized above.    ASSESSMENT & PLAN:   1. Chronic atrial fib with recent bradycardia - improved with discontinuation of atenolol. This seems to have helped her asthma as well and overall sense of energy. She does now notice however a sensation of heart pounding when she does activity. I suspect this is an elevated HR response given her significant deconditioning. I think she would benefit from being back on some degree of beta blockade. I have recommended she start Toprol 12.67m daily - hopefully the beta selectivity will not affect her asthma, nor significantly reduce her HR. I educated the patient and husband on how to  check HR at home. They will call if HR goes <50 or >100. She says her daughter is a Marine scientist and can also check this periodically. 2. CAD - no recent anginal symptoms. Not on beta blocker due to concomitant Xarelto. Continue current regimen; adding BB as above. 3. Hyperlipidemia - she states she is just now getting back on Repatha which is why her LDL recently was quite elevated. She tells me she needs to be put back on a "list." I'm not sure what she means, so I will reach out to our pharmacist for further direction. 4. Morbid obesity - discussed importance of long term weight loss. She is compliant with CPAP.  Disposition: F/u with Dr. Irish Lack in 6 months.   Medication Adjustments/Labs and Tests Ordered: Current medicines are reviewed at length with the patient today.  Concerns regarding medicines are outlined above. Medication changes, Labs and Tests ordered today are summarized above and listed in the Patient Instructions accessible in Encounters.   Signed, Charlie Pitter, PA-C  04/10/2017 4:01 PM    [facility] [address], [place], [place]  [address] Phone: [phone]; Fax: [phone]

## 2017-04-10 NOTE — Patient Instructions (Addendum)
Medication Instructions:  Your physician has recommended you make the following change in your medication:  1.  START Toprol XL 25 mg taking 1/2 tablet daily  Labwork: None ordered  Testing/Procedures: None ordered  Follow-Up: Your physician wants you to follow-up in: [place] DR. VARANASI   You will receive a reminder letter in the mail two months in advance. If you don't receive a letter, please call our office to schedule the follow-up appointment.    Any Other Special Instructions Will Be Listed Below (If Applicable).  PLEASE CALL us IF YOUR HEART RATE IS LESS THAN 50 OR GREATER THAN 100   If you need a refill on your cardiac medications before your next appointment, please call your pharmacy.

## 2017-04-11 ENCOUNTER — Telehealth: Payer: Self-pay | Admitting: Pharmacist

## 2017-04-11 DIAGNOSIS — E782 Mixed hyperlipidemia: Secondary | ICD-10-CM

## 2017-04-11 NOTE — Telephone Encounter (Signed)
Spoke with pt who states she has done 2 of her Repatha once monthly infusions so far. She has been getting Repatha through [employer] and also needs an updated prior authorization. Scheduled labs after pt will have done 3 of her infusions - the first week of January. Will then need to submit a new prior authorization and have pt fill out a [employer] application for continued coverage.

## 2017-05-08 ENCOUNTER — Other Ambulatory Visit: Payer: Medicare Other | Admitting: *Deleted

## 2017-05-08 DIAGNOSIS — E782 Mixed hyperlipidemia: Secondary | ICD-10-CM | POA: Diagnosis not present

## 2017-05-09 LAB — HEPATIC FUNCTION PANEL
ALK PHOS: 62 IU/L (ref 39–117)
ALT: 16 IU/L (ref 0–32)
AST: 21 IU/L (ref 0–40)
Albumin: 3.9 g/dL (ref 3.5–4.8)
BILIRUBIN TOTAL: 1.1 mg/dL (ref 0.0–1.2)
BILIRUBIN, DIRECT: 0.32 mg/dL (ref 0.00–0.40)
Total Protein: 6 g/dL (ref 6.0–8.5)

## 2017-05-09 LAB — LIPID PANEL
CHOLESTEROL TOTAL: 92 mg/dL — AB (ref 100–199)
Chol/HDL Ratio: 2 ratio (ref 0.0–4.4)
HDL: 47 mg/dL (ref >39)
LDL Calculated: 25 mg/dL (ref 0–99)
TRIGLYCERIDES: 102 mg/dL (ref 0–149)
VLDL Cholesterol Cal: 20 mg/dL (ref 5–40)

## 2017-05-09 NOTE — Telephone Encounter (Signed)
New prior authorization for Repatha submitted with updated lipid panel results. Will submit to [employer] once approved for financial assistance.

## 2017-05-12 ENCOUNTER — Ambulatory Visit: Payer: Medicare Other | Admitting: Interventional Cardiology

## 2017-05-18 ENCOUNTER — Emergency Department (HOSPITAL_COMMUNITY): Payer: Medicare Other

## 2017-05-18 ENCOUNTER — Encounter (HOSPITAL_COMMUNITY): Payer: Self-pay | Admitting: Emergency Medicine

## 2017-05-18 ENCOUNTER — Emergency Department (HOSPITAL_COMMUNITY)
Admission: EM | Admit: 2017-05-18 | Discharge: 2017-05-18 | Disposition: A | Discharge status: Home / Self Care | Payer: Medicare Other | Attending: Emergency Medicine | Admitting: Emergency Medicine

## 2017-05-18 DIAGNOSIS — S0083XA Contusion of other part of head, initial encounter: Secondary | ICD-10-CM | POA: Diagnosis not present

## 2017-05-18 DIAGNOSIS — Y999 Unspecified external cause status: Secondary | ICD-10-CM | POA: Insufficient documentation

## 2017-05-18 DIAGNOSIS — Y9384 Activity, sleeping: Secondary | ICD-10-CM | POA: Insufficient documentation

## 2017-05-18 DIAGNOSIS — S0990XA Unspecified injury of head, initial encounter: Secondary | ICD-10-CM | POA: Insufficient documentation

## 2017-05-18 DIAGNOSIS — S0081XA Abrasion of other part of head, initial encounter: Secondary | ICD-10-CM | POA: Insufficient documentation

## 2017-05-18 DIAGNOSIS — W06XXXA Fall from bed, initial encounter: Secondary | ICD-10-CM | POA: Diagnosis not present

## 2017-05-18 DIAGNOSIS — Y92003 Bedroom of unspecified non-institutional (private) residence as the place of occurrence of the external cause: Secondary | ICD-10-CM | POA: Diagnosis not present

## 2017-05-18 DIAGNOSIS — T148XXA Other injury of unspecified body region, initial encounter: Secondary | ICD-10-CM

## 2017-05-18 NOTE — ED Provider Notes (Signed)
[facility] DEPT Provider Note   CSN: [id number] Arrival date & time: 05/18/17  1506     History   Chief Complaint Chief Complaint  Patient presents with  . Fall    HPI [name] is a 82 y.o. female.  82 year old female presents complaining of left-sided facial pain after she struck it against a nightstand.  Patient states that she was sleeping and rolled out of bed and fell onto her left face.  She woke up immediately and had dull aching pain around her left inferior orbit.  Denied any neck discomfort.  No pain from the chest down.  Denies any visual changes.  Has not had any recent illnesses.  Patient does take Xarelto.      Past Medical History:  Diagnosis Date  . Allergy   . Anxiety   . Arthritis    no cartlidge in knees  . Asthma   . Bradycardia    a. atenolol stopped due to this, HR 40s.  . 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 CAD in native artery    a.  CAD s/p RCA stent 2000 with residual mild-mod disease of left system 2001.  Marland Kitchen Chronic atrial fibrillation ([place])   . COPD (chronic obstructive pulmonary disease) ([place])   . Essential hypertension 03/24/2015  . Gout    post operative  . Hx of radiation therapy 04/02/11 -05/20/11   left breast  . Hyperlipidemia   . Hypothyroidism 03/24/2015  . Incontinence of urine   . Malignant neoplasm of upper-outer quadrant of female breast ([place]) 12/26/2010  . Osteoporosis 03/24/2015  . Peripheral neuropathy 03/24/2015  . Plantar fasciitis   . Sleep apnea     Patient Active Problem List   Diagnosis Date Noted  . Hammer toes of both feet 12/19/2016  . Allergic rhinitis 12/09/2016  . Pedal edema 09/17/2016  . Chest pain 07/18/2016  . Gross hematuria 04/26/2016  . Hyperglycemia 03/21/2016  . Dysuria 12/07/2015  . Fever blister 12/07/2015  . Anxiety and depression 06/30/2015  . Encounter for well adult exam with abnormal findings 06/30/2015  . Neck mass 03/24/2015    . Hypothyroidism 03/24/2015  . Gout 03/24/2015  . Essential hypertension 03/24/2015  . Asthma 03/24/2015  . Peripheral neuropathy 03/24/2015  . Osteoporosis 03/24/2015  . OSA (obstructive sleep apnea) 03/24/2015  . Morbid obesity ([place]) 03/24/2015  . Long term current use of anticoagulant therapy 03/16/2015  . Lymphadenopathy, cervical 03/16/2015  . Diarrhea 03/16/2015  . Hyperbilirubinemia 03/16/2015  . Coronary arteriosclerosis 09/22/2014  . Hyperlipidemia 07/23/2013  . Hypokalemia 02/18/2013  . Elevated bilirubin 02/18/2013  . Plantar fasciitis   . Arthritis   . Allergy   . Hx of radiation therapy   . Atrial fibrillation ([place]) 01/21/2011  . Malignant neoplasm of upper-outer quadrant of left breast in female, estrogen receptor positive ([place]) 12/26/2010  . 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 BREAST LUMPECTOMY W/ NEEDLE LOCALIZATION  01/29/2011   Left with [facility] Dr Margot Chimes  . CHOLECYSTECTOMY  1955  . CORONARY ANGIOPLASTY WITH STENT PLACEMENT  2000  . DILATION AND CURETTAGE OF UTERUS  1976   and 1996  . HAND SURGERY  1992   tendons between thumb and forefinger  . SKIN BIOPSY     1994, 2006, 2008, 2010, 2011, 2011, 2012-  pre-cancerous  . TONSILLECTOMY  1955    OB History  No data available       Home Medications    Prior to Admission medications   Medication Sig Start Date End Date Taking? Authorizing Provider  albuterol (PROVENTIL) (2.5 MG/3ML) 0.083% nebulizer solution Take 3 mLs (2.5 mg total) by nebulization every 6 (six) hours as needed for wheezing or shortness of breath. 09/17/16   Biagio Borg, MD  allopurinol (ZYLOPRIM) 100 MG tablet Take 300 mg by mouth daily.     [provider]  ALPRAZolam Duanne Moron) 0.5 MG tablet TAKE 1 TO 2 TABLETS BY MOUTH TWICE DAILY IF NEEDED FOR ANXIETY/SLEEP 03/07/17   Biagio Borg, MD  azelastine (ASTELIN) 0.1 % nasal spray Place 2 sprays into both  nostrils daily. Use in each nostril as directed    [provider]  azelastine (OPTIVAR) 0.05 % ophthalmic solution Place 1 drop into both eyes 2 (two) times daily. 09/17/16   Biagio Borg, MD  Biotin 5000 MCG CAPS Take 5,000 mcg by mouth daily.     [provider]  budesonide-formoterol (SYMBICORT) 160-4.5 MCG/ACT inhaler Inhale 2 puffs 2 (two) times daily into the lungs.    [provider]  buPROPion (WELLBUTRIN XL) 150 MG 24 hr tablet Take 1 tablet (150 mg total) by mouth daily. 06/30/15   Biagio Borg, MD  cholecalciferol (VITAMIN D) 1000 UNITS tablet Take 1,000 Units by mouth daily.    [provider]  co-enzyme Q-10 50 MG capsule Take 400 mg by mouth daily.    [provider]  colchicine (COLCRYS) 0.6 MG tablet Take 0.6 mg by mouth daily as needed (flare  up).     [provider]  escitalopram (LEXAPRO) 10 MG tablet take 1 tablet by mouth once daily 02/03/17   Biagio Borg, MD  Evolocumab with Infusor ([place]) 420 MG/3.5ML SOCT Inject 1 Units into the skin every 30 (thirty) days. 04/11/17   Jettie Booze, MD  ezetimibe (ZETIA) 10 MG tablet Take 1/2 to 1 tablet by mouth daily as directed 03/31/17   Jettie Booze, MD  gabapentin (NEURONTIN) 100 MG capsule Take 1 capsule (100 mg total) daily by mouth. 03/20/17   Biagio Borg, MD  hydrochlorothiazide (HYDRODIURIL) 25 MG tablet take 2 tablets by mouth once daily 04/04/17   Biagio Borg, MD  LETROZOLE PO Take 5 mg by mouth daily.    [provider]  levocetirizine Harlow Ohms) 5 MG tablet take 1 tablet by mouth at bedtime 02/03/17   Biagio Borg, MD  levothyroxine (SYNTHROID, LEVOTHROID) 75 MCG tablet Take 1 tablet (75 mcg total) daily by mouth. 03/20/17   Biagio Borg, MD  metoprolol succinate (TOPROL XL) 25 MG 24 hr tablet Take 0.5 tablets (12.5 mg total) by mouth daily. 04/10/17   Dunn, Nedra Hai, PA-C  montelukast (SINGULAIR) 10 MG tablet Take 10 mg daily  by mouth. 02/18/17   [provider]  nitrofurantoin, macrocrystal-monohydrate, (MACROBID) 100 MG capsule Take 1 capsule (100 mg total) by mouth 2 (two) times daily. 10/04/16   Biagio Borg, MD  nystatin cream (MYCOSTATIN) Apply 1 application topically daily as needed for dry skin.  03/09/14   [provider]  potassium chloride (K-DUR) 10 MEQ tablet Take 1 tablet (10 mEq total) daily by mouth. 03/20/17   Biagio Borg, MD  PROAIR HFA 108 [phone] Base) MCG/ACT inhaler INHALE 2 PUFFS BY MOUTH INTO THE LUNGS AS NEEDED FOR WHEEZING/SHORTNESS OF BREATH 02/19/17   Biagio Borg, MD  RA ALLERGY RELIEF 180 MG tablet take 1 tablet by mouth once daily 03/07/17   Biagio Borg, MD  rivaroxaban (XARELTO) 20 MG TABS tablet Take 1 tablet (20 mg total) by mouth daily. 03/25/17   Jettie Booze, MD  rosuvastatin (CRESTOR) 10 MG tablet Take 1 tablet by mouth twice a week. 03/31/17   Jettie Booze, MD  traMADol (ULTRAM) 50 MG tablet Take 50 mg by mouth every 12 (twelve) hours as needed for moderate pain.  04/23/12   [provider]  traMADol-acetaminophen (ULTRACET) 37.5-325 MG tablet Take 1-2 tablets by mouth every 6 (six) hours as needed for moderate pain or severe pain.  02/23/15   [provider]  triamcinolone (NASACORT AQ) 55 MCG/ACT AERO nasal inhaler Place 2 sprays daily into the nose. 03/20/17   Biagio Borg, MD  vitamin C (ASCORBIC ACID) 500 MG tablet Take 500 mg by mouth daily.    [provider]    Family History Family History  Problem Relation Age of Onset  . Heart disease Father   . Cancer Father        intestinal polyps  . Heart attack Father   . Asthma Sister   . Heart disease Brother   . Heart attack Brother     Social History Social History   Tobacco Use  . Smoking status: Former Smoker    Packs/day: 1.00    Years: 4.00    Pack years: 4.00    Last attempt to quit: 12/05/1962    Years since quitting: 54.4  . Smokeless tobacco:  Never Used  Substance Use Topics  . Alcohol use: No  . Drug use: No     Allergies   Crestor [rosuvastatin calcium]; Hydrocodone-acetaminophen; Lidocaine hcl; Lipitor [atorvastatin calcium]; Procaine; Rosuvastatin; Theophylline; Vicodin [hydrocodone-acetaminophen]; Codeine; Oxycodone-acetaminophen; Propoxyphene; Quinine derivatives; Sulfa antibiotics; Atorvastatin; Ephedrine; Cephalexin; Epinephrine; Penicillins; and Theophyllines   Review of Systems Review of Systems  All other systems reviewed and are negative.    Physical Exam Updated Vital Signs BP (!) 145/94 (BP Location: Left Arm)   Pulse 78   Temp 98.2 F (36.8 C) (Oral)   Resp 18   SpO2 95%   Physical Exam  Constitutional: She is oriented to person, place, and time. She appears well-developed and well-nourished.  Non-toxic appearance. No distress.  HENT:  Head: Head is with abrasion and with left periorbital erythema.    Eyes: Conjunctivae, EOM and lids are normal. Pupils are equal, round, and reactive to light.    No hyphema  Neck: Normal range of motion. Neck supple. No tracheal deviation present. No thyroid mass present.  Cardiovascular: Normal rate, regular rhythm and normal heart sounds. Exam reveals no gallop.  No murmur heard. Pulmonary/Chest: Effort normal and breath sounds normal. No stridor. No respiratory distress. She has no decreased breath sounds. She has no wheezes. She has no rhonchi. She has no rales.  Abdominal: Soft. Normal appearance and bowel sounds are normal. She exhibits no distension. There is no tenderness. There is no rebound and no CVA tenderness.  Musculoskeletal: Normal range of motion. She exhibits no edema or tenderness.  Neurological: She is alert and oriented to person, place, and time. She has normal strength. No cranial nerve deficit or sensory deficit. GCS eye subscore is 4. GCS verbal subscore is 5. GCS motor subscore is 6.  Skin: Skin is warm and dry. No abrasion and no rash  noted.  Psychiatric: She has a normal mood and affect. Her speech is  normal and behavior is normal.  Nursing note and vitals reviewed.    ED Treatments / Results  Labs (all labs ordered are listed, but only abnormal results are displayed) Labs Reviewed - No data to display  EKG  EKG Interpretation None       Radiology No results found.  Procedures Procedures (including critical care time)  Medications Ordered in ED Medications - No data to display   Initial Impression / Assessment and Plan / ED Course  I have reviewed the triage vital signs and the nursing notes.  Pertinent labs & imaging results that were available during my care of the patient were reviewed by me and considered in my medical decision making (see chart for details).     Patient's x-rays are negative.  Wounds dressed by nursing and patient stable for discharge  Final Clinical Impressions(s) / ED Diagnoses   Final diagnoses:  None    ED Discharge Orders    None       Lacretia Leigh, MD 05/18/17 1703

## 2017-05-18 NOTE — ED Notes (Signed)
ED Provider at bedside. 

## 2017-05-18 NOTE — ED Notes (Signed)
Bacitracin and ice applied to abrasion on face

## 2017-05-18 NOTE — ED Notes (Signed)
Patient transported to CT 

## 2017-05-18 NOTE — ED Triage Notes (Signed)
Patient reports waking up this morning after hitting left side of face on night stand. Pt unsure if she got tangled in the sheets while getting up to use restroom. No LOC. Pt is on blood thinners. C/o left sided facial pain. Bruising and swelling to left eye. Pt states she usually uses a walker at home and a wheelchair for long distances.

## 2017-05-22 ENCOUNTER — Telehealth: Payer: Self-pay | Admitting: Pharmacist

## 2017-05-22 NOTE — Telephone Encounter (Signed)
Pt has been approved for [place] financial assistance through [employer] for the 2019 year. LMOM for patient.

## 2017-05-27 ENCOUNTER — Telehealth: Payer: Self-pay

## 2017-05-27 NOTE — Telephone Encounter (Signed)
I spoke with patient in regards to head injury. She fell on 05/18/2017. She said she is feeling fuzzy and not as sharp. She did have a headache but no longer has a headache. She also describes feeling motion sick. Patient speaks about sinus congestion and post-nasal drip. She says that the sinus issue is more of a concern to her. I let her know that she should see Dr. Jenny Reichmann to address that issue but that we can see her for the head injury. Patient then states that she feels that she is coming along with the head injury but would like to be seen for the congestion. Patient scheduled to see Dr. Jenny Reichmann for congestion.

## 2017-05-28 ENCOUNTER — Other Ambulatory Visit (INDEPENDENT_AMBULATORY_CARE_PROVIDER_SITE_OTHER): Payer: Medicare Other

## 2017-05-28 ENCOUNTER — Ambulatory Visit (INDEPENDENT_AMBULATORY_CARE_PROVIDER_SITE_OTHER): Payer: Medicare Other | Admitting: Internal Medicine

## 2017-05-28 ENCOUNTER — Encounter: Payer: Self-pay | Admitting: Internal Medicine

## 2017-05-28 VITALS — BP 118/70 | HR 75 | Temp 98.0°F | Ht 66.5 in

## 2017-05-28 DIAGNOSIS — E876 Hypokalemia: Secondary | ICD-10-CM | POA: Diagnosis not present

## 2017-05-28 DIAGNOSIS — S060X0D Concussion without loss of consciousness, subsequent encounter: Secondary | ICD-10-CM

## 2017-05-28 DIAGNOSIS — R05 Cough: Secondary | ICD-10-CM | POA: Diagnosis not present

## 2017-05-28 DIAGNOSIS — R059 Cough, unspecified: Secondary | ICD-10-CM

## 2017-05-28 MED ORDER — LEVOFLOXACIN 250 MG PO TABS: 250.0000 mg | ORAL_TABLET | Freq: Every day | ORAL | 0 refills | Status: AC

## 2017-05-28 MED ORDER — POTASSIUM CHLORIDE ER 10 MEQ PO TBCR: 20.0000 meq | EXTENDED_RELEASE_TABLET | Freq: Every day | ORAL | 3 refills | Status: DC

## 2017-05-28 NOTE — Patient Instructions (Addendum)
Please take all new medication as prescribed - the antibitoic  You can also take Delsym OTC for cough, and/or Mucinex (or it's generic off brand) for congestion, and tylenol as needed for pain.  Please continue all other medications as before, and refills have been done if requested.  Please have the pharmacy call with any other refills you may need.  Please keep your appointments with your specialists as you may have planned  Please go to the LAB in the Basement (turn left off the elevator) for the tests to be done today  You will be contacted by phone if any changes need to be made immediately.  Otherwise, you will receive a letter about your results with an explanation, but please check with MyChart first.  Please remember to sign up for MyChart if you have not done so, as this will be important to you in the future with finding out test results, communicating by private email, and scheduling acute appointments online when needed.
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
## 2017-05-29 ENCOUNTER — Encounter: Payer: Self-pay | Admitting: Internal Medicine

## 2017-05-29 ENCOUNTER — Other Ambulatory Visit: Payer: Self-pay | Admitting: Internal Medicine

## 2017-05-29 ENCOUNTER — Telehealth: Payer: Self-pay

## 2017-05-29 DIAGNOSIS — R05 Cough: Secondary | ICD-10-CM | POA: Insufficient documentation

## 2017-05-29 DIAGNOSIS — S060XAA Concussion with loss of consciousness status unknown, initial encounter: Secondary | ICD-10-CM | POA: Insufficient documentation

## 2017-05-29 DIAGNOSIS — R059 Cough, unspecified: Secondary | ICD-10-CM | POA: Insufficient documentation

## 2017-05-29 DIAGNOSIS — S060X9A Concussion with loss of consciousness of unspecified duration, initial encounter: Secondary | ICD-10-CM | POA: Insufficient documentation

## 2017-05-29 LAB — BASIC METABOLIC PANEL
BUN: 18 mg/dL (ref 6–23)
CALCIUM: 9.1 mg/dL (ref 8.4–10.5)
CO2: 28 mEq/L (ref 19–32)
Chloride: 104 mEq/L (ref 96–112)
Creatinine, Ser: 0.82 mg/dL (ref 0.40–1.20)
GFR: 71.34 mL/min (ref >60.00)
GLUCOSE: 180 mg/dL — AB (ref 70–99)
POTASSIUM: 3.4 meq/L — AB (ref 3.5–5.1)
SODIUM: 142 meq/L (ref 135–145)

## 2017-05-29 MED ORDER — POTASSIUM CHLORIDE ER 10 MEQ PO TBCR: 30.0000 meq | EXTENDED_RELEASE_TABLET | Freq: Every day | ORAL | 3 refills | Status: DC

## 2017-05-29 NOTE — Assessment & Plan Note (Signed)
Mild to mod, for f/u lab on increased K,  to f/u any worsening symptoms or concerns

## 2017-05-29 NOTE — Assessment & Plan Note (Signed)
Mild to mod, c/w bronchitis vs pna, declines cxr, for antibx course, cough med prn,  to f/u any worsening symptoms or concerns 

## 2017-05-29 NOTE — Telephone Encounter (Signed)
-----   Message from Biagio Borg, MD sent at 05/29/2017 12:27 PM EST ----- Letter sent, cont same tx except  The test results show that your current treatment is OK, except the potassium is still slightly low.  Please increase the potassium pills to three in the AM.  I will send a prescription, and you should hear from the office as well.    [name] to please inform pt, I will do rx

## 2017-05-29 NOTE — Telephone Encounter (Signed)
Pt has been informed and expressed understanding.  

## 2017-05-29 NOTE — Assessment & Plan Note (Signed)
Mild post fall, improving symptoms, exam benign, cont to follow with supportive care

## 2017-06-02 ENCOUNTER — Telehealth: Payer: Self-pay
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
## 2017-06-09 NOTE — Progress Notes (Signed)
Subjective:     Chief Complaint: [name], DOB: 04-19-1936, is a 82 y.o. female who presents for a head injury that was resultant of a fall on 05/25/17. Patient was asleep and fell out of bed hitting her face on the nightstand and then on the floor. She did not lose consciousness. Patient was seen in ER. CT in chart. Patient did have a headache initially and felt dizzy. She said that she does not have headaches and the dizziness is becomign less and less. She still experiences eye fatigue, difficulty concentrating and difficulty with recalling events as quickly. She also complains of neck and lumbar spine pain and that she is shaking more than she was before the injury. Patient is concerned with decrease range of motion in cervical spine.    Injury date : 05/25/2017 Visit #: 06/10/17  History of Present Illness:   Patient's goals/priorities: Return to baseline   Review of Systems: Pertinent items are noted in HPI.  Review of History: Past Medical History:  Past Medical History:  Diagnosis Date  . Allergy   . Anxiety   . Arthritis    no cartlidge in knees  . Asthma   . Bradycardia    a. atenolol stopped due to this, HR 40s.  . 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 CAD in native artery    a.  CAD s/p RCA stent 2000 with residual mild-mod disease of left system 2001.  Marland Kitchen Chronic atrial fibrillation ([place])   . COPD (chronic obstructive pulmonary disease) ([place])   . Essential hypertension 03/24/2015  . Gout    post operative  . Hx of radiation therapy 04/02/11 -05/20/11   left breast  . Hyperlipidemia   . Hypothyroidism 03/24/2015  . Incontinence of urine   . Malignant neoplasm of upper-outer quadrant of female breast ([place]) 12/26/2010  . Osteoporosis 03/24/2015  . Peripheral neuropathy 03/24/2015  . Plantar fasciitis   . Sleep apnea     Past Surgical History:  has a past surgical history that includes Tonsillectomy (1955); Cholecystectomy (1955); Hand surgery  (1992); Coronary angioplasty with stent (2000); Skin biopsy; Dilation and curettage of uterus (1976); and Breast lumpectomy w/ needle localization (01/29/2011). Family History: family history includes Asthma in her sister; Cancer in her father; Heart attack in her brother and father; Heart disease in her brother and father. Social History:  reports that she quit smoking about 54 years ago. She has a 4.00 pack-year smoking history. she has never used smokeless tobacco. She reports that she does not drink alcohol or use drugs. Current Medications: has a current medication list which includes the following prescription(s): albuterol, allopurinol, alprazolam, azelastine, azelastine, biotin, budesonide-formoterol, bupropion, cholecalciferol, co-enzyme q-10, colchicine, escitalopram, evolocumab with infusor, ezetimibe, gabapentin, hydrochlorothiazide, letrozole, levocetirizine, levothyroxine, metoprolol succinate, montelukast, nitrofurantoin (macrocrystal-monohydrate), nystatin cream, potassium chloride, proair hfa, ra allergy relief, rivaroxaban, rosuvastatin, tramadol, tramadol-acetaminophen, triamcinolone, and vitamin c. Allergies: is allergic to crestor [rosuvastatin calcium]; hydrocodone-acetaminophen; lidocaine hcl; lipitor [atorvastatin calcium]; procaine; rosuvastatin; theophylline; vicodin [hydrocodone-acetaminophen]; codeine; oxycodone-acetaminophen; propoxyphene; quinine derivatives; sulfa antibiotics; atorvastatin; ephedrine; cephalexin; epinephrine; penicillins; and theophyllines.  Objective:    Physical Examination Vitals:   06/10/17 1335  BP: 118/70  Pulse: 74  SpO2: 98%   General appearance: alert, appears stated age and cooperative Head: Normocephalic, without obvious abnormality, bruising still noted over the left maxillary. Eyes: conjunctivae/corneas clear. PERRL, EOM's intact. Fundi benign. Sclera anicteric. Lungs: clear to auscultation bilaterally and percussion Heart: regular rate  and rhythm, S1,  S2 normal, small systolic murmur noted   Neurologic: CN 2-12 normal.  Sensation to pain, touch, and proprioception normal.  DTRs  normal in upper and lower extremities patient though did have resting tremor of the bilateral upper extremities. No pathologic reflexes. Neg rhomberg, modified rhomberg, pronator drift, tandem gait, finger-to-nose; see some difficulty with vestibular and ocular motor testing. Psychiatric: Oriented X3, intact recent and remote memory, judgement and insight, normal mood and affect patient did have difficulty with serial sevens as well as 3 number recall. Neck: Inspection loss of lordosis. No palpable stepoffs.  Significant discomfort noted to palpation of the paraspinal musculature. Negative Spurling's maneuver. Mild limitation in range of motion lacking degrees of extension and the 5 degrees of rotation bilaterally Grip strength and sensation normal in bilateral hands Strength good C4 to T1 distribution No sensory change to C4 to T1 Negative Hoffman sign bilaterally Reflexes normal    Assessment:    Cervical spine pain - Plan: DG Cervical Spine Complete, Ambulatory referral to Physical Therapy  [name] presents with the following concussion subtypes. _0 Cognitive _1 Cervical _2 Vestibular _3 Ocular _4 Migraine _5 Anxiety/Mood   Plan:   Action/Discussion: Reviewed diagnosis, management options, expected outcomes, and the reasons for scheduled and emergent follow-up. Questions were adequately answered. Patient expressed verbal understanding and agreement with the following plan.       I was personally involved with the physical evaluation of and am in agreement with the assessment and treatment plan for this patient.  Greater than 50% of this encounter was spent in direct consultation with the patient in evaluation, counseling, and coordination of care. Duration of encounter: 65 minutes.  After Visit Summary printed out and provided to  patient as appropriate.

## 2017-06-10 ENCOUNTER — Ambulatory Visit (INDEPENDENT_AMBULATORY_CARE_PROVIDER_SITE_OTHER)
Admission: RE | Admit: 2017-06-10 | Discharge: 2017-06-10 | Disposition: A | Discharge status: Home / Self Care | Payer: Medicare Other | Source: Ambulatory Visit | Attending: Family Medicine | Admitting: Family Medicine

## 2017-06-10 ENCOUNTER — Ambulatory Visit: Payer: Medicare Other | Admitting: Family Medicine

## 2017-06-10 ENCOUNTER — Encounter: Payer: Self-pay | Admitting: Family Medicine

## 2017-06-10 VITALS — BP 118/70 | HR 74

## 2017-06-10 DIAGNOSIS — S134XXA Sprain of ligaments of cervical spine, initial encounter: Secondary | ICD-10-CM | POA: Diagnosis not present

## 2017-06-10 DIAGNOSIS — M542 Cervicalgia: Secondary | ICD-10-CM

## 2017-06-10 DIAGNOSIS — S060X0D Concussion without loss of consciousness, subsequent encounter: Secondary | ICD-10-CM | POA: Diagnosis not present

## 2017-06-10 NOTE — Assessment & Plan Note (Signed)
Patient does have what appears to be a concussion.  Still having some difficulty.  We discussed over-the-counter medications.  Mom think patient is improving slowly though based on patient's symptoms improving slowly with her subjective findings.  Discussed that with patient in great length.  I would like to send patient to physical therapy for vestibular neuro training.  Patient will follow up with me again in 3 weeks

## 2017-06-10 NOTE — Patient Instructions (Signed)
Good to see you  We will get you in with PT  Neck xray downstairs Over the counter lets try  Tart cherry extract 450mg  nightly B12 1062mcg daily  B6 200mg  daily  Choline 500mg  daily  Continue the vitamin D  See me again in 3 weeks and we will make sure you are doing well

## 2017-06-10 NOTE — Assessment & Plan Note (Signed)
Some mild home exercises given.  X-rays ordered today to rule out any type of occult fracture.  Patient will be sent to physical therapy as well.  Discussed icing regimen.  Worsening symptoms we will consider advanced imaging.  Patient is having no radicular symptoms at this time.

## 2017-06-12 DIAGNOSIS — M170 Bilateral primary osteoarthritis of knee: Secondary | ICD-10-CM | POA: Diagnosis not present

## 2017-06-12 DIAGNOSIS — Z79899 Other long term (current) drug therapy: Secondary | ICD-10-CM | POA: Diagnosis not present

## 2017-06-12 DIAGNOSIS — M1009 Idiopathic gout, multiple sites: Secondary | ICD-10-CM | POA: Diagnosis not present

## 2017-06-18 DIAGNOSIS — M79671 Pain in right foot: Secondary | ICD-10-CM | POA: Diagnosis not present

## 2017-06-18 DIAGNOSIS — M79642 Pain in left hand: Secondary | ICD-10-CM | POA: Diagnosis not present

## 2017-06-18 DIAGNOSIS — M25562 Pain in left knee: Secondary | ICD-10-CM | POA: Diagnosis not present

## 2017-06-18 DIAGNOSIS — M25561 Pain in right knee: Secondary | ICD-10-CM | POA: Diagnosis not present

## 2017-06-30 NOTE — Progress Notes (Signed)
[facility] [address], [place] [address] Phone: [phone] Subjective:     CC: Concussion and whiplash follow-up  [id number]  [name] is a 82 y.o. female coming in with complaint of whiplash.  Started vestibular neuro training for a postconcussive type area.  Patient states doing significantly better.  Patient feels like the dizziness and the headache is significantly improved.  Still having a bruise on the face.  Nothing otherwise severe.  He is making progress.  Happy with the results so far.     Past Medical History:  Diagnosis Date  . Allergy   . Anxiety   . Arthritis    no cartlidge in knees  . Asthma   . Bradycardia    a. atenolol stopped due to this, HR 40s.  . 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 CAD in native artery    a.  CAD s/p RCA stent 2000 with residual mild-mod disease of left system 2001.  Marland Kitchen Chronic atrial fibrillation ([place])   . COPD (chronic obstructive pulmonary disease) ([place])   . Essential hypertension 03/24/2015  . Gout    post operative  . Hx of radiation therapy 04/02/11 -05/20/11   left breast  . Hyperlipidemia   . Hypothyroidism 03/24/2015  . Incontinence of urine   . Malignant neoplasm of upper-outer quadrant of female breast ([place]) 12/26/2010  . Osteoporosis 03/24/2015  . Peripheral neuropathy 03/24/2015  . Plantar fasciitis   . Sleep apnea    Past Surgical History:  Procedure Laterality Date  . BREAST LUMPECTOMY W/ NEEDLE LOCALIZATION  01/29/2011   Left with [facility] Dr Margot Chimes  . CHOLECYSTECTOMY  1955  . CORONARY ANGIOPLASTY WITH STENT PLACEMENT  2000  . DILATION AND CURETTAGE OF UTERUS  1976   and 1996  . HAND SURGERY  1992   tendons between thumb and forefinger  . SKIN BIOPSY     1994, 2006, 2008, 2010, 2011, 2011, 2012-  pre-cancerous  . TONSILLECTOMY  1955   Social History   Socioeconomic History  . Marital status: Married    Spouse name: None  . Number of  children: None  . Years of education: None  . Highest education level: None  Social Needs  . Financial resource strain: None  . Food insecurity - worry: None  . Food insecurity - inability: None  . Transportation needs - medical: None  . Transportation needs - non-medical: None  Occupational History  . None  Tobacco Use  . Smoking status: Former Smoker    Packs/day: 1.00    Years: 4.00    Pack years: 4.00    Last attempt to quit: 12/05/1962    Years since quitting: 54.6  . Smokeless tobacco: Never Used  Substance and Sexual Activity  . Alcohol use: No  . Drug use: No  . Sexual activity: None  Other Topics Concern  . None  Social History Narrative  . None   Allergies  Allergen Reactions  . Crestor [Rosuvastatin Calcium]     Severe liver function problems  . Hydrocodone-Acetaminophen Anxiety and Itching  . Lidocaine Hcl Other (See Comments)    Novacaine:  Becomes very shaky  . Lipitor [Atorvastatin Calcium] Other (See Comments)    Elevated liver function   . Procaine Other (See Comments)    shaking  . Rosuvastatin Other (See Comments)    Severe liver function problems  . Theophylline Other (See Comments)    Becomes  very shaky skaking  . Vicodin [Hydrocodone-Acetaminophen] Itching    Itching all over  . Codeine Nausea Only, Anxiety and Other (See Comments)    Also complained of dizziness.  Has same problems with oxycodone and hydrocodone Visual Disturbance, Balance Difficulty, Dizzy "Room Spinning; "Swimming" Balance, vision, nausea, dizzy, "swimming", "room spinning"  . Oxycodone-Acetaminophen Nausea Only, Anxiety and Other (See Comments)    Visual Disturbance, Balance Difficulty, Dizzy "Room Spinning; "Swimming"  . Propoxyphene Anxiety, Nausea Only and Other (See Comments)    Visual Disturbance, Balance Difficulty, Dizzy "Room Spinning; "Swimming"  . Quinine Derivatives Nausea Only    dizzy  . Sulfa Antibiotics Nausea Only    Also complained of dizziness  .  Atorvastatin Other (See Comments)    Severe liver function problems  . Ephedrine Other (See Comments)    shaking  . Cephalexin Rash  . Epinephrine Other (See Comments)    Patient becomes "shaky" shaking  . Penicillins Rash    Pt reported all over body rash in 1958 Has patient had a PCN reaction causing immediate rash, facial/tongue/throat swelling, SOB or lightheadedness with hypotension:Yes Has patient had a PCN reaction causing severe rash involving mucus membranes or skin necrosis: No Has patient had a PCN reaction that required hospitalization: No Has patient had a PCN reaction occurring within the last 10 years:No    . Theophyllines Other (See Comments)    Patient becomes "shaky"   Family History  Problem Relation Age of Onset  . Heart disease Father   . Cancer Father        intestinal polyps  . Heart attack Father   . Asthma Sister   . Heart disease Brother   . Heart attack Brother      Past medical history, social, surgical and family history all reviewed in electronic medical record.  No pertanent information unless stated regarding to the chief complaint.   Review of Systems:Review of systems updated and as accurate as of 07/01/17  No  visual changes, nausea, vomiting, diarrhea, constipation, dizziness, abdominal pain, skin rash, fevers, chills, night sweats, weight loss, swollen lymph nodes, body aches, joint swelling,  chest pain, shortness of breath, mood changes.  Positive headaches and muscle aches but improving  Objective  Blood pressure 136/90, pulse 67, height '5\' 6"'$  (1.676 m), SpO2 93 %. Systems examined below as of 07/01/17   General: No apparent distress alert and oriented x3 mood and affect normal, dressed appropriately.  HEENT: Pupils equal, extraocular movements intact still has some bruising under the left eye Respiratory: Patient's speak in full sentences and does not appear short of breath  Cardiovascular: Trace lower extremity edema, non tender, no  erythema  Skin: Warm dry intact with no signs of infection or rash on extremities or on axial skeleton.  Abdomen: Soft nontender  Neuro: Cranial nerves II through XII are intact, neurovascularly intact in all extremities with 2+ DTRs and 2+ pulses.  Lymph: No lymphadenopathy of posterior or anterior cervical chain or axillae bilaterally.  Gait in a wheelchair MSK: Mild to moderate tender with full range of motion and good stability and symmetric strength and tone of shoulders, elbows, wrist, hip, knee and ankles bilaterally.  Severe arthritic changes of multiple joints  Has some limitation in range of motion still has loss of lordosis.  Negative Spurling's bilaterally.  Patient no longer gets dizzy with range of motion.  Crepitus though still noted.    Impression and Recommendations:     This case required medical decision making  of moderate complexity.      Note: This dictation was prepared with Dragon dictation along with smaller phrase technology. Any transcriptional errors that result from this process are unintentional.

## 2017-07-01 ENCOUNTER — Ambulatory Visit: Payer: Medicare Other | Admitting: Family Medicine

## 2017-07-01 ENCOUNTER — Encounter: Payer: Self-pay | Admitting: Family Medicine

## 2017-07-01 DIAGNOSIS — S134XXA Sprain of ligaments of cervical spine, initial encounter: Secondary | ICD-10-CM | POA: Diagnosis not present

## 2017-07-01 DIAGNOSIS — S060X0D Concussion without loss of consciousness, subsequent encounter: Secondary | ICD-10-CM | POA: Diagnosis not present

## 2017-07-01 NOTE — Patient Instructions (Signed)
Good to see you  Overall you are doing great  Arnica lotion 2 times daily  Warm compression 10 minutes 2 times day  Continue the vitamins See me when you need me

## 2017-07-01 NOTE — Assessment & Plan Note (Signed)
Patient is doing relatively well at this time.  Continue over-the-counter medications for at least another month.  Follow-up as needed if not completely resolved at that time

## 2017-07-01 NOTE — Assessment & Plan Note (Signed)
Patient seems to be doing very well with the over-the-counter medications.  Encouraged her to continue this at the time.  Patient has not gone to formal physical therapy and encouraged her to do stool still.  Patient will consider it.  As long as patient continues to improve can follow-up with me as needed

## 2017-07-16 DIAGNOSIS — M19011 Primary osteoarthritis, right shoulder: Secondary | ICD-10-CM | POA: Diagnosis not present

## 2017-07-16 DIAGNOSIS — M19012 Primary osteoarthritis, left shoulder: Secondary | ICD-10-CM | POA: Diagnosis not present

## 2017-07-22 ENCOUNTER — Ambulatory Visit: Payer: Medicare Other | Admitting: Physical Therapy

## 2017-07-24 ENCOUNTER — Ambulatory Visit: Payer: Medicare Other | Admitting: Physical Therapy

## 2017-07-24 ENCOUNTER — Other Ambulatory Visit: Payer: Self-pay

## 2017-07-24 ENCOUNTER — Ambulatory Visit: Payer: Medicare Other | Attending: Family Medicine | Admitting: Rehabilitative and Restorative Service Providers"

## 2017-07-24 DIAGNOSIS — M542 Cervicalgia: Secondary | ICD-10-CM | POA: Diagnosis not present

## 2017-07-24 DIAGNOSIS — R2689 Other abnormalities of gait and mobility: Secondary | ICD-10-CM | POA: Diagnosis not present

## 2017-07-24 NOTE — Therapy (Signed)
[facility] [address] [place], [place], [address] Phone: [phone]   Fax:  [phone]  Physical Therapy Evaluation  Patient Details  Name: [name] MRN: [id number] Date of Birth: April 08, 1936 Referring Provider: Hulan Saas, DO   Encounter Date: 07/24/2017  PT End of Session - 07/24/17 1428    Visit Number  1    Number of Visits  12    Date for PT Re-Evaluation  09/22/17    Authorization Type  [employer] medicare $40 copay    PT Start Time  1322    PT Stop Time  1410    PT Time Calculation (min)  48 min    Activity Tolerance  Patient tolerated treatment well    Behavior During Therapy  [facility] for tasks assessed/performed       Past Medical History:  Diagnosis Date  . Allergy   . Anxiety   . Arthritis    no cartlidge in knees  . Asthma   . Bradycardia    a. atenolol stopped due to this, HR 40s.  . 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 CAD in native artery    a.  CAD s/p RCA stent 2000 with residual mild-mod disease of left system 2001.  Marland Kitchen Chronic atrial fibrillation ([place])   . COPD (chronic obstructive pulmonary disease) ([place])   . Essential hypertension 03/24/2015  . Gout    post operative  . Hx of radiation therapy 04/02/11 -05/20/11   left breast  . Hyperlipidemia   . Hypothyroidism 03/24/2015  . Incontinence of urine   . Malignant neoplasm of upper-outer quadrant of female breast ([place]) 12/26/2010  . Osteoporosis 03/24/2015  . Peripheral neuropathy 03/24/2015  . Plantar fasciitis   . Sleep apnea     Past Surgical History:  Procedure Laterality Date  . BREAST LUMPECTOMY W/ NEEDLE LOCALIZATION  01/29/2011   Left with [facility] Dr Margot Chimes  . CHOLECYSTECTOMY  1955  . CORONARY ANGIOPLASTY WITH STENT PLACEMENT  2000  . DILATION AND CURETTAGE OF UTERUS  1976   and 1996  . HAND SURGERY  1992   tendons between thumb and forefinger  . SKIN BIOPSY     1994, 2006, 2008, 2010, 2011, 2011,  2012-  pre-cancerous  . TONSILLECTOMY  1955    There were no vitals filed for this visit.   Subjective Assessment - 07/24/17 1322    Subjective  The patient is s/p a fall 05/18/17 -- she thinks she got up while sleeping and hit her head on the nightstand and then on the floor.   She was seen at the ED and sustained a concussion noting it is "mostly gone".  She notes she is having some issues with handwriting and memory (forgets numbers).  She notes continued generalized pain (worse since the fall).  She is taking prednisone for knee pain and recently got injections in her shoulders.  Her neck is stiff.  She notes she still is "swimmy" sometimes in her head.   Notes headaches improved since concussion, now gets them intermittently.     Pertinent History  She has recently had knee injections, walker was hurting her shoulders, left wrist replacement.  Reviewed PMH in epic.  PRIOR FUNCTIONAL STATUS:  Uses walker in the house and w/c in the community b/c she didn't want to fall.  Walker helps with knee pain, but makes shoulders worse.     Patient Stated Goals  Do what the physician  said.    Currently in Pain?  Yes    Pain Score  -- minimized now because of prednisone shots    Pain Location  -- Was knees and shoulders.  However improved since injections, back also hurt a lot.    Pain Descriptors / Indicators  Aching    Pain Onset  More than a month ago    Pain Frequency  Intermittent    Aggravating Factors   worse with standing and weight bearing through arms; back of neck feels stiff    Pain Relieving Factors  time since concussion, meds helping         [facility] PT Assessment - 07/24/17 1337      Assessment   Medical Diagnosis  concussion 05/18/17    Referring Provider  Hulan Saas, DO    Onset Date/Surgical Date  05/18/17    Prior Therapy  none      Precautions   Precautions  Fall      Restrictions   Weight Bearing Restrictions  No      Balance Screen   Has the patient fallen in the  past 6 months  Yes    How many times?  1    Has the patient had a decrease in activity level because of a fear of falling?   Yes due to knee pain and fear of falling    Is the patient reluctant to leave their home because of a fear of falling?   Yes needs help from husband      Home Environment   Living Environment  Private residence    Living Arrangements  Spouse/significant other    Type of [place] entrance    Remy - 2 wheels;Walker - 4 wheels;Wheelchair - manual;Grab bars - toilet;Grab bars - tub/shower;Shower seat bedae in bathroom      Prior Function   Level of Independence  Independent with basic ADLs Notes she could live alone if needed to    Leisure  Used to swim at [facility] and used stairs to access.  She notes she has not participated in swimming x 5 years (since breast cancer treatment).       Sensation   Light Touch  -- had numbness in hands improved with injection      ROM / Strength   AROM / PROM / Strength  AROM      AROM   AROM Assessment Site  Cervical    Cervical Flexion  25    Cervical Extension  15    Cervical - Right Side Bend  10    Cervical - Left Side Bend  10    Cervical - Right Rotation  50    Cervical - Left Rotation  50      Palpation   Palpation comment  Patient tender in bilat upper trapezius musculature and tender with posterior>anterior lower c-spine joint mobilizations.       Ambulation/Gait   Ambulation/Gait  Yes    Ambulation/Gait Assistance  6: Modified independent (Device/Increase time)    Ambulation Distance (Feet)  180 Feet    Assistive device  Rolling walker    Ambulation Surface  Level;Indoor    Gait velocity  1.45 ft/sec    Gait Comments  Patient ambulates with shoulders elevated while weight bearing through the walker.  She takes small steps with decreased stride length.        High Level Balance   High  Level Balance Comments  Standing  - can stand without UE support- needs HHA to take  steps w/c<>mat.         Vestibular Assessment - 07/24/17 1344      Vestibular Assessment   General Observation  Denies vision changes, notes initially kept eyes closed but this has improved.  Patient reports no dizziness with bedmoiblity or position changes during session or at home.      Symptom Behavior   Type of Dizziness  Imbalance "swimmy" sensation    Frequency of Dizziness  intermittent    Duration of Dizziness  seconds    Aggravating Factors  Activity in general    Relieving Factors  -- clears quickly      Occulomotor Exam   Occulomotor Alignment  Normal    Spontaneous  Absent    Gaze-induced  Absent    Smooth Pursuits  Intact    Saccades  Intact      Vestibulo-Occular Reflex   VOR 1 Head Only (x 1 viewing)  Slow VOR x 1 does not increase symptoms.    Comment  Head Impulse Test:  Positive to the right side indicating dec'd VOR         Objective measurements completed on examination: See above findings.              PT Education - 07/24/17 1430    Education provided  Yes    Education Details  Discussed physical therapy goals    Person(s) Educated  Patient    Methods  Explanation    Comprehension  Verbalized understanding       PT Short Term Goals - 07/24/17 1431      PT SHORT TERM GOAL #1   Title  The patient will return demo HEP for neck flexibility, general strength/conditioning.    Time  4    Period  Weeks    Target Date  08/23/17      PT SHORT TERM GOAL #2   Title  The patient will improve neck extension AROM from 15 degrees to > or equal to 24 degrees     Time  4    Period  Weeks    Target Date  08/23/17      PT SHORT TERM GOAL #3   Title  The patient will return demonstrate standing with RW with improved postural awareness performing shoulder depression and improved hand position to reduce pain with walker use.    Time  4    Period  Weeks    Target Date  08/23/17      PT SHORT TERM GOAL #4   Title  Further assess standing  balance and LTG to follow.    Time  4    Period  Weeks    Target Date  08/23/17        PT Long Term Goals - 07/24/17 1435      PT LONG TERM GOAL #1   Title  The patient will verbalize understanding of community wellness for post d/c activities.    Time  8    Period  Weeks    Target Date  09/22/17      PT LONG TERM GOAL #2   Title  The patient will improve neck AROM from 50 degrees bilateral rotation to > or equal to 58 degrees bilaterally.    Time  8    Period  Weeks    Target Date  09/22/17      PT LONG TERM GOAL #  3   Title  The patient will improve cervical sidebending from 10 degrees bilat to > or equal to 15 degrees.    Time  8    Period  Weeks    Target Date  09/22/17      PT LONG TERM GOAL #4   Title  The patient will have LTG for balance once assessed.    Time  8    Period  Weeks    Target Date  09/22/17             Plan - 07/24/17 1438    Clinical Impression Statement  [name] is a 82 year old female referred to outpatient physical therapy for post concussive symptoms including neck stiffness, occasional "swimmy" sensation noting imbalance, and general increase in baseline pain since sustaining a fall.  PT will address ROM deficits, pain, and balance in order to return to prior functional status.     History and Personal Factors relevant to plan of care:  h/o breast cancer, h/o osteoarthritis, h/o falls,     Clinical Presentation  Stable    Clinical Presentation due to:  improving since concussion    Clinical Decision Making  Low    Rehab Potential  Good    Clinical Impairments Affecting Rehab Potential  patient motivated to participate    PT Frequency  2x / week    PT Duration  4 weeks then 1x/week x 4 weeks    PT Treatment/Interventions  ADLs/Self Care Home Management;Balance training;Neuromuscular re-education;Vestibular;Therapeutic exercise;Therapeutic activities;Manual techniques;Gait training;Patient/family education;Dry needling    PT Next Visit  Plan  Establish HEP: VOR x 1, ASSESS standing balance (functional or Berg), neck ROM and stretching for home, thoracic self mobilization, extended gait (look at posture)    Consulted and Agree with Plan of Care  Patient;Family member/caregiver    Family Member Consulted  spouse       Patient will benefit from skilled therapeutic intervention in order to improve the following deficits and impairments:  Abnormal gait, Hypomobility, Pain, Decreased balance, Decreased mobility, Impaired flexibility  Visit Diagnosis: Neck pain  Other abnormalities of gait and mobility     Problem List Patient Active Problem List   Diagnosis Date Noted  . Whiplash 06/10/2017  . Cough 05/29/2017  . Concussion 05/29/2017  . Hammer toes of both feet 12/19/2016  . Allergic rhinitis 12/09/2016  . Pedal edema 09/17/2016  . Chest pain 07/18/2016  . Gross hematuria 04/26/2016  . Hyperglycemia 03/21/2016  . Dysuria 12/07/2015  . Fever blister 12/07/2015  . Anxiety and depression 06/30/2015  . Encounter for well adult exam with abnormal findings 06/30/2015  . Neck mass 03/24/2015  . Hypothyroidism 03/24/2015  . Gout 03/24/2015  . Essential hypertension 03/24/2015  . Asthma 03/24/2015  . Peripheral neuropathy 03/24/2015  . Osteoporosis 03/24/2015  . OSA (obstructive sleep apnea) 03/24/2015  . Morbid obesity ([place]) 03/24/2015  . Long term current use of anticoagulant therapy 03/16/2015  . Lymphadenopathy, cervical 03/16/2015  . Diarrhea 03/16/2015  . Hyperbilirubinemia 03/16/2015  . Coronary arteriosclerosis 09/22/2014  . Hyperlipidemia 07/23/2013  . Hypokalemia 02/18/2013  . Elevated bilirubin 02/18/2013  . Plantar fasciitis   . Arthritis   . Allergy   . Hx of radiation therapy   . Atrial fibrillation ([place]) 01/21/2011  . Malignant neoplasm of upper-outer quadrant of left breast in female, estrogen receptor positive ([place]) 12/26/2010  . 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

## 2017-07-31 ENCOUNTER — Encounter: Payer: Self-pay | Admitting: Rehabilitation

## 2017-07-31 ENCOUNTER — Ambulatory Visit: Payer: Medicare Other | Admitting: Rehabilitation

## 2017-07-31 DIAGNOSIS — R2689 Other abnormalities of gait and mobility: Secondary | ICD-10-CM | POA: Diagnosis not present

## 2017-07-31 DIAGNOSIS — M542 Cervicalgia: Secondary | ICD-10-CM | POA: Diagnosis not present

## 2017-07-31 NOTE — Patient Instructions (Signed)
   Scapular Retraction  Do this exercise seated, sit closer to edge of chair so that feet can remain flat on floor.  Sit up tall.  Wrap an elastic band around a door knob or banister. Grab the ends of the band with both hands with your arms extended. With good posture, pull the band backwards and squeeze your shoulder blades together for 3 seconds. Make sure your elbows stay close to your body.  Do x 10 reps, 2 times per day.      Seated Shoulder Extension  Wrap band around table leg or tie knot in middle of band and place in closed door. Keeping elbows straight and palms towards the floor, pull band down and back.  Sit tall and only take arms as far back as you can while keeping them straight.  Do 10 reps x 2 times per day.   NECK: Rotation    Rotate head to look over shoulder. _10__ reps per set, __2_ sets per day, _5-7__ days per week. Hold for 2-3 secs each direction.    Copyright  VHI. All rights reserved.   Axial Extension (Chin Tuck)    Do this lying down!!! Pull chin in and lengthen back of neck. Hold __5__ seconds while counting out loud. Repeat __10__ times. Do __2__ sessions per day.  http://gt2.exer.us/450   Copyright  VHI. All rights reserved.

## 2017-07-31 NOTE — Therapy (Signed)
[facility] [address] [place], [place], [address] Phone: [phone]   Fax:  [phone]  Physical Therapy Treatment  Patient Details  Name: [name] MRN: [id number] Date of Birth: [birth year] Referring Provider: Hulan Saas, DO   Encounter Date: 07/31/2017  PT End of Session - 07/31/17 1254    Visit Number  2    Number of Visits  12    Date for PT Re-Evaluation  09/22/17    Authorization Type  [employer] medicare $40 copay    PT Start Time  1102    PT Stop Time  1145    PT Time Calculation (min)  43 min    Activity Tolerance  Patient tolerated treatment well    Behavior During Therapy  [facility] for tasks assessed/performed       Past Medical History:  Diagnosis Date  . Allergy   . Anxiety   . Arthritis    no cartlidge in knees  . Asthma   . Bradycardia    a. atenolol stopped due to this, HR 40s.  . 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 CAD in native artery    a.  CAD s/p RCA stent 2000 with residual mild-mod disease of left system 2001.  Marland Kitchen Chronic atrial fibrillation ([place])   . COPD (chronic obstructive pulmonary disease) ([place])   . Essential hypertension 03/24/2015  . Gout    post operative  . Hx of radiation therapy 04/02/11 -05/20/11   left breast  . Hyperlipidemia   . Hypothyroidism 03/24/2015  . Incontinence of urine   . 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 Osteoporosis 03/24/2015  . Peripheral neuropathy 03/24/2015  . Plantar fasciitis   . Sleep apnea     Past Surgical History:  Procedure Laterality Date  . BREAST LUMPECTOMY W/ NEEDLE LOCALIZATION  01/29/2011   Left with SL[facility] Dr Margot Chimes  . CHOLECYSTECTOMY  1955  . CORONARY ANGIOPLASTY WITH STENT PLACEMENT  2000  . DILATION AND CURETTAGE OF UTERUS  1976   and 1996  . HAND SURGERY  1992   tendons between thumb and forefinger  . SKIN BIOPSY     1994, 2006, 2008, 2010, 2011, 2011,  2012-  pre-cancerous  . TONSILLECTOMY  1955    There were no vitals filed for this visit.  Subjective Assessment - 07/31/17 1103    Subjective  "My dizziness is gone, I just feel like my neck is stiffer today and that's my main issue."     Pertinent History  She has recently had knee injections, walker was hurting her shoulders, left wrist replacement.  Reviewed PMH in epic.  PRIOR FUNCTIONAL STATUS:  Uses walker in the house and w/c in the community b/c she didn't want to fall.  Walker helps with knee pain, but makes shoulders worse.     Patient Stated Goals  Do what the physician said.    Currently in Pain?  Yes    Pain Score  -- doesn't rate but always has knee pain                No data recorded       [facility] Adult PT Treatment/Exercise - 07/31/17 0001      Ambulation/Gait   Ambulation/Gait  Yes    Ambulation/Gait Assistance  5: Supervision    Ambulation/Gait Assistance Details  Had pt ambulate x 2 reps (with seated rest break) during session with  RW to better assess posture/shoulder compensation.  Note that initially RW too elevated (as compared to where pt typically has RW set at home) and therefore she did tend to keep shoulders elevated and has head held forward.  Lowered RW one more space and had her ambulate again with marked improvement in posture with cues for shoulder depression, scapular depression and retraction to better utilize large back extensors vs arm and neck musculature for compensation.  Pt verbalized understanding.      Ambulation Distance (Feet)  115 Feet x 2 reps    Assistive device  Rolling walker    Gait Pattern  Step-through pattern;Trunk flexed;Wide base of support    Ambulation Surface  Level;Indoor      Exercises   Exercises  Shoulder;Neck      Neck Exercises: Theraband   Scapula Retraction  10 reps;Red    Scapula Retraction Limitations  seated with cues for posture and feet flat on floor    Shoulder Extension  10 reps;Red    Shoulder  Extension Limitations  seated position with feet flat on floor and cues for maintained elbow extension      Neck Exercises: Supine   Neck Retraction  10 reps;5 secs    Neck Retraction Limitations  Cues for decreased ROM due to increased pain with increased force of motion    Cervical Rotation  Both;10 reps    Cervical Rotation Limitations  Did in both supine and sitting.  Feel that she will get more benefit from sitting, but needed cues for depressed shoulders in sitting.  Educated husband to cue pt as needed.       Manual Therapy   Manual Therapy  Soft tissue mobilization;Myofascial release;Manual Traction    Manual therapy comments  For decreased pain and improved flexibility    Soft tissue mobilization  Provided soft tissue mobilization to cervical paraspinal muscles, B levator scapulae, and B upper trap    Myofascial Release  Performed trigger point release at B levator muscles at insertion with cervical lateral flexion     Manual Traction  Manual traction with suboccipital release x 2 reps of 1 min             PT Education - 07/31/17 1254    Education provided  Yes    Education Details  initial HEP for strengthening and cervical ROM    Person(s) Educated  Patient;Spouse    Methods  Explanation;Demonstration;Handout    Comprehension  Verbalized understanding;Returned demonstration       PT Short Term Goals - 07/31/17 1256      PT SHORT TERM GOAL #1   Title  The patient will return demo HEP for neck flexibility, general strength/conditioning.    Time  4    Period  Weeks      PT SHORT TERM GOAL #2   Title  The patient will improve neck extension AROM from 15 degrees to > or equal to 24 degrees     Time  4    Period  Weeks      PT SHORT TERM GOAL #3   Title  The patient will return demonstrate standing with RW with improved postural awareness performing shoulder depression and improved hand position to reduce pain with walker use.    Time  4    Period  Weeks      PT  SHORT TERM GOAL #4   Title  Further assess standing balance and LTG to follow.    Baseline  Pt feels  that balance is at baseline    Time  4    Period  Weeks    Status  Deferred        PT Long Term Goals - 07/31/17 1256      PT LONG TERM GOAL #1   Title  The patient will verbalize understanding of community wellness for post d/c activities.    Time  8    Period  Weeks      PT LONG TERM GOAL #2   Title  The patient will improve neck AROM from 50 degrees bilateral rotation to > or equal to 58 degrees bilaterally.    Time  8    Period  Weeks      PT LONG TERM GOAL #3   Title  The patient will improve cervical sidebending from 10 degrees bilat to > or equal to 15 degrees.    Time  8    Period  Weeks      PT LONG TERM GOAL #4   Title  The patient will have LTG for balance once assessed.    Baseline  Pt feels balance is at baseline    Time  8    Period  Weeks    Status  Deferred            Plan - 07/31/17 1254    Clinical Impression Statement  Pt reports that dizziness has resolved and that balance is at baseline due to severe B knee pain, therefore did not provide pt with VOR exercises and did not assess BERG.  Pts main goal is to improve neck ROM and decrease pain, therefore will continue to focus on this during sessions.     Rehab Potential  Good    Clinical Impairments Affecting Rehab Potential  patient motivated to participate    PT Frequency  2x / week    PT Duration  4 weeks then 1x/week x 4 weeks    PT Treatment/Interventions  ADLs/Self Care Home Management;Balance training;Neuromuscular re-education;Vestibular;Therapeutic exercise;Therapeutic activities;Manual techniques;Gait training;Patient/family education;Dry needling    PT Next Visit Plan  check HEP, add more neck ROM and stretching for home, thoracic self mobilization, extended gait (look at posture)-can continue to address as able    Consulted and Agree with Plan of Care  Patient;Family member/caregiver     Family Member Consulted  spouse       Patient will benefit from skilled therapeutic intervention in order to improve the following deficits and impairments:  Abnormal gait, Hypomobility, Pain, Decreased balance, Decreased mobility, Impaired flexibility  Visit Diagnosis: Neck pain  Other abnormalities of gait and mobility     Problem List Patient Active Problem List   Diagnosis Date Noted  . Whiplash 06/10/2017  . Cough 05/29/2017  . Concussion 05/29/2017  . Hammer toes of both feet 12/19/2016  . Allergic rhinitis 12/09/2016  . Pedal edema 09/17/2016  . Chest pain 07/18/2016  . Gross hematuria 04/26/2016  . Hyperglycemia 03/21/2016  . Dysuria 12/07/2015  . Fever blister 12/07/2015  . Anxiety and depression 06/30/2015  . Encounter for well adult exam with abnormal findings 06/30/2015  . Neck mass 03/24/2015  . Hypothyroidism 03/24/2015  . Gout 03/24/2015  . Essential hypertension 03/24/2015  . Asthma 03/24/2015  . Peripheral neuropathy 03/24/2015  . Osteoporosis 03/24/2015  . OSA (obstructive sleep apnea) 03/24/2015  . Morbid obesity ([place]) 03/24/2015  . Long term current use of anticoagulant therapy 03/16/2015  . Lymphadenopathy, cervical 03/16/2015  . Diarrhea 03/16/2015  . Hyperbilirubinemia 03/16/2015  .  Coronary arteriosclerosis 09/22/2014  . Hyperlipidemia 07/23/2013  . Hypokalemia 02/18/2013  . Elevated bilirubin 02/18/2013  . Plantar fasciitis   . Arthritis   . Allergy   . Hx of radiation therapy   . Atrial fibrillation ([place]) 01/21/2011  . Malignant neoplasm of upper-outer quadrant of left breast in female, estrogen receptor positive ([place]) 12/26/2010  . 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

## 2017-08-07 ENCOUNTER — Ambulatory Visit: Payer: Medicare Other | Admitting: Rehabilitative and Restorative Service Providers"

## 2017-08-08 ENCOUNTER — Ambulatory Visit: Payer: Medicare Other | Admitting: Rehabilitative and Restorative Service Providers"

## 2017-08-11 ENCOUNTER — Ambulatory Visit: Payer: Medicare Other | Attending: Family Medicine | Admitting: Rehabilitative and Restorative Service Providers"

## 2017-08-11 ENCOUNTER — Encounter: Payer: Self-pay | Admitting: Rehabilitative and Restorative Service Providers"

## 2017-08-11 DIAGNOSIS — M542 Cervicalgia: Secondary | ICD-10-CM | POA: Insufficient documentation

## 2017-08-11 DIAGNOSIS — R2689 Other abnormalities of gait and mobility: Secondary | ICD-10-CM

## 2017-08-11 NOTE — Patient Instructions (Signed)
NECK: Rotation    Rotate head to look over shoulder. _10__ reps per set, __2_ sets per day, _5-7__ days per week. Hold for 2-3 secs each direction.    Copyright  VHI. All rights reserved.   Axial Extension (Chin Tuck)    Do this lying down!!! Pull chin in and lengthen back of neck. Hold __5__ seconds while counting out loud. Repeat __10__ times. Do __2__ sessions per day.  http://gt2.exer.us/450   Copyright  VHI. All rights reserved.   External Rotation (Passive)    Lying on back with BOTH elbows bent to 90 degrees.  Gently rotate arms out to the side and hold for 5 seconds.  Repeat 10 times.  BOTH ARMS WILL MOVE TOGETHER. Copyright  VHI. All rights reserved.    EXTENSION: Supine - Elbow Extended (Isometric)    Lie on back, right arm at side on surface. Press arm down into surface with elbow straight. Hold __5_ seconds. Complete __1_ sets of __10-15_ repetitions. Perform _1-2__ sessions per day.  CAN DO BOTH ARMS AT THE SAME TIME. Copyright  VHI. All rights reserved.

## 2017-08-12 ENCOUNTER — Other Ambulatory Visit: Payer: Self-pay | Admitting: Internal Medicine

## 2017-08-12 NOTE — Therapy (Signed)
[facility] [address] [place], [place], [address] Phone: [phone]   Fax:  [phone]  Physical Therapy Treatment  Patient Details  Name: [name] MRN: [id number] Date of Birth: [birth year] Referring Provider: Hulan Saas, DO   Encounter Date: 08/11/2017  PT End of Session - 08/11/17 1427    Visit Number  3    Number of Visits  12    Date for PT Re-Evaluation  09/22/17    Authorization Type  [employer] medicare $40 copay    PT Start Time  1402    PT Stop Time  1442    PT Time Calculation (min)  40 min    Activity Tolerance  Patient tolerated treatment well    Behavior During Therapy  [facility] for tasks assessed/performed       Past Medical History:  Diagnosis Date  . Allergy   . Anxiety   . Arthritis    no cartlidge in knees  . Asthma   . Bradycardia    a. atenolol stopped due to this, HR 40s.  . 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 CAD in native artery    a.  CAD s/p RCA stent 2000 with residual mild-mod disease of left system 2001.  Marland Kitchen Chronic atrial fibrillation ([place])   . COPD (chronic obstructive pulmonary disease) ([place])   . Essential hypertension 03/24/2015  . Gout    post operative  . Hx of radiation therapy 04/02/11 -05/20/11   left breast  . Hyperlipidemia   . Hypothyroidism 03/24/2015  . Incontinence of urine   . Malignant neoplasm of upper-outer quadrant of female breast ([place]) 12/26/2010  . Osteoporosis 03/24/2015  . Peripheral neuropathy 03/24/2015  . Plantar fasciitis   . Sleep apnea     Past Surgical History:  Procedure Laterality Date  . BREAST LUMPECTOMY W/ NEEDLE LOCALIZATION  01/29/2011   Left with [facility] Dr Margot Chimes  . CHOLECYSTECTOMY  1955  . CORONARY ANGIOPLASTY WITH STENT PLACEMENT  2000  . DILATION AND CURETTAGE OF UTERUS  1976   and 1996  . HAND SURGERY  1992   tendons between thumb and forefinger  . SKIN BIOPSY     1994, 2006, 2008, 2010, 2011, 2011, 2012-   pre-cancerous  . TONSILLECTOMY  1955    There were no vitals filed for this visit.  Subjective Assessment - 08/11/17 1411    Subjective  The patient reports the left wrist/hand has pain from holding theraband (she has h/o left wrist replacement).  Therefore, PT may need to modify HEP further.    Pertinent History  She has recently had knee injections, walker was hurting her shoulders, left wrist replacement.  Reviewed PMH in epic.  PRIOR FUNCTIONAL STATUS:  Uses walker in the house and w/c in the community b/c she didn't want to fall.  Walker helps with knee pain, but makes shoulders worse.     Patient Stated Goals  Do what the physician said.    Currently in Pain?  Yes                       [place] Adult PT Treatment/Exercise - 08/11/17 1519      Ambulation/Gait   Ambulation/Gait  Yes    Ambulation/Gait Assistance  5: Supervision    Ambulation/Gait Assistance Details  The patient ambulated with barriatric RW in clinic with cues on posture/shoulder positioning x 230 feet nonstop.  Ambulation Distance (Feet)  230 Feet    Assistive device  Rolling walker    Ambulation Surface  Level;Indoor      Exercises   Exercises  Shoulder;Neck      Neck Exercises: Theraband   Scapula Retraction  Limitations    Scapula Retraction Limitations  Modified due to left wrist pain with h/o joint replacement      Neck Exercises: Seated   Neck Retraction  5 reps    Neck Retraction Limitations  Discussed performing prior to neck rotation.    Cervical Rotation  5 reps    Cervical Rotation Limitations  seated with emphasis on good posture prior to rotation      Neck Exercises: Supine   Neck Retraction  10 reps;5 secs    Cervical Rotation  Both;10 reps      Shoulder Exercises: Supine   Other Supine Exercises  Supine shoulder retraction with shoulder ER x 10 reps *provided for HEP      Shoulder Exercises: Seated   Retraction  AROM;Strengthening;Right;Left;10 reps    Retraction  Limitations  with isometric holds x 5 seconds.    Other Seated Exercises  Shoulder circles (up/back/down) x 5 reps x 2 sets      Manual Therapy   Manual Therapy  Soft tissue mobilization;Manual Traction    Manual therapy comments  For decreased pain and improved flexibility    Soft tissue mobilization  Soft tissue mobilization to paraspinal muscles, B levator    Manual Traction  manual traction with suboccipital relase; passive overpressure with contract relax at end range for improved carryover of ROM             PT Education - 08/12/17 0916    Education provided  Yes    Education Details  modified HEP due to pain in hands with theraband use    Person(s) Educated  Patient;Spouse    Methods  Explanation;Demonstration;Handout    Comprehension  Verbalized understanding;Returned demonstration       PT Short Term Goals - 07/31/17 1256      PT SHORT TERM GOAL #1   Title  The patient will return demo HEP for neck flexibility, general strength/conditioning.    Time  4    Period  Weeks      PT SHORT TERM GOAL #2   Title  The patient will improve neck extension AROM from 15 degrees to > or equal to 24 degrees     Time  4    Period  Weeks      PT SHORT TERM GOAL #3   Title  The patient will return demonstrate standing with RW with improved postural awareness performing shoulder depression and improved hand position to reduce pain with walker use.    Time  4    Period  Weeks      PT SHORT TERM GOAL #4   Title  Further assess standing balance and LTG to follow.    Baseline  Pt feels that balance is at baseline    Time  4    Period  Weeks    Status  Deferred        PT Long Term Goals - 07/31/17 1256      PT LONG TERM GOAL #1   Title  The patient will verbalize understanding of community wellness for post d/c activities.    Time  8    Period  Weeks      PT LONG TERM GOAL #2   Title  The patient will improve neck AROM from 50 degrees bilateral rotation to > or equal to  58 degrees bilaterally.    Time  8    Period  Weeks      PT LONG TERM GOAL #3   Title  The patient will improve cervical sidebending from 10 degrees bilat to > or equal to 15 degrees.    Time  8    Period  Weeks      PT LONG TERM GOAL #4   Title  The patient will have LTG for balance once assessed.    Baseline  Pt feels balance is at baseline    Time  8    Period  Weeks    Status  Deferred            Plan - 08/12/17 0086    Clinical Impression Statement  The patient reports that exercise is improving mobility.  She notes increased awareness of posture when walking with RW.  PT to continue to progress to STGs/LTGs.     PT Treatment/Interventions  ADLs/Self Care Home Management;Balance training;Neuromuscular re-education;Vestibular;Therapeutic exercise;Therapeutic activities;Manual techniques;Gait training;Patient/family education;Dry needling    PT Next Visit Plan  check HEP, thoracic self mobilization, extended gait, neck ROM/flexibility and strengthening    Consulted and Agree with Plan of Care  Patient;Family member/caregiver    Family Member Consulted  spouse       Patient will benefit from skilled therapeutic intervention in order to improve the following deficits and impairments:  Abnormal gait, Hypomobility, Pain, Decreased balance, Decreased mobility, Impaired flexibility  Visit Diagnosis: Neck pain  Other abnormalities of gait and mobility     Problem List Patient Active Problem List   Diagnosis Date Noted  . Whiplash 06/10/2017  . Cough 05/29/2017  . Concussion 05/29/2017  . Hammer toes of both feet 12/19/2016  . Allergic rhinitis 12/09/2016  . Pedal edema 09/17/2016  . Chest pain 07/18/2016  . Gross hematuria 04/26/2016  . Hyperglycemia 03/21/2016  . Dysuria 12/07/2015  . Fever blister 12/07/2015  . Anxiety and depression 06/30/2015  . Encounter for well adult exam with abnormal findings 06/30/2015  . Neck mass 03/24/2015  . Hypothyroidism  03/24/2015  . Gout 03/24/2015  . Essential hypertension 03/24/2015  . Asthma 03/24/2015  . Peripheral neuropathy 03/24/2015  . Osteoporosis 03/24/2015  . OSA (obstructive sleep apnea) 03/24/2015  . Morbid obesity ([place]) 03/24/2015  . Long term current use of anticoagulant therapy 03/16/2015  . Lymphadenopathy, cervical 03/16/2015  . Diarrhea 03/16/2015  . Hyperbilirubinemia 03/16/2015  . Coronary arteriosclerosis 09/22/2014  . Hyperlipidemia 07/23/2013  . Hypokalemia 02/18/2013  . Elevated bilirubin 02/18/2013  . Plantar fasciitis   . Arthritis   . Allergy   . Hx of radiation therapy   . Atrial fibrillation ([place]) 01/21/2011  . Malignant neoplasm of upper-outer quadrant of left breast in female, estrogen receptor positive ([place]) 12/26/2010  . 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

## 2017-08-12 NOTE — Telephone Encounter (Signed)
Done erx 

## 2017-08-15 ENCOUNTER — Ambulatory Visit: Payer: Medicare Other | Admitting: Rehabilitation

## 2017-08-15 ENCOUNTER — Encounter: Payer: Self-pay | Admitting: Rehabilitation

## 2017-08-15 DIAGNOSIS — M542 Cervicalgia: Secondary | ICD-10-CM

## 2017-08-15 DIAGNOSIS — R2689 Other abnormalities of gait and mobility: Secondary | ICD-10-CM | POA: Diagnosis not present

## 2017-08-15 NOTE — Therapy (Signed)
[facility] [address] [place], [place], [address] Phone: [phone]   Fax:  [phone]  Physical Therapy Treatment  Patient Details  Name: [name] MRN: [id number] Date of Birth: [birth year] Referring Provider: Hulan Saas, DO   Encounter Date: 08/15/2017  PT End of Session - 08/15/17 1508    Visit Number  4    Number of Visits  12    Date for PT Re-Evaluation  09/22/17    Authorization Type  [employer] medicare $40 copay    PT Start Time  1305    PT Stop Time  1400    PT Time Calculation (min)  55 min    Activity Tolerance  Patient tolerated treatment well    Behavior During Therapy  [facility] for tasks assessed/performed       Past Medical History:  Diagnosis Date  . Allergy   . Anxiety   . Arthritis    no cartlidge in knees  . Asthma   . Bradycardia    a. atenolol stopped due to this, HR 40s.  . 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 CAD in native artery    a.  CAD s/p RCA stent 2000 with residual mild-mod disease of left system 2001.  Marland Kitchen Chronic atrial fibrillation ([place])   . COPD (chronic obstructive pulmonary disease) ([place])   . Essential hypertension 03/24/2015  . Gout    post operative  . Hx of radiation therapy 04/02/11 -05/20/11   left breast  . Hyperlipidemia   . Hypothyroidism 03/24/2015  . Incontinence of urine   . Malignant neoplasm of upper-outer quadrant of female breast ([place]) 12/26/2010  . Osteoporosis 03/24/2015  . Peripheral neuropathy 03/24/2015  . Plantar fasciitis   . Sleep apnea     Past Surgical History:  Procedure Laterality Date  . BREAST LUMPECTOMY W/ NEEDLE LOCALIZATION  01/29/2011   Left with [facility] Dr Margot Chimes  . CHOLECYSTECTOMY  1955  . CORONARY ANGIOPLASTY WITH STENT PLACEMENT  2000  . DILATION AND CURETTAGE OF UTERUS  1976   and 1996  . HAND SURGERY  1992   tendons between thumb and forefinger  . SKIN BIOPSY     1994, 2006, 2008, 2010, 2011, 2011,  2012-  pre-cancerous  . TONSILLECTOMY  1955    There were no vitals filed for this visit.  Subjective Assessment - 08/15/17 1310    Subjective  Reports overall feeling better overall with less pain.     Pertinent History  She has recently had knee injections, walker was hurting her shoulders, left wrist replacement.  Reviewed PMH in epic.  PRIOR FUNCTIONAL STATUS:  Uses walker in the house and w/c in the community b/c she didn't want to fall.  Walker helps with knee pain, but makes shoulders worse.     Patient Stated Goals  Do what the physician said.    Currently in Pain?  Yes    Pain Score  0-No pain                       [facility] Adult PT Treatment/Exercise - 08/15/17 1326      Exercises   Exercises  Neck      Neck Exercises: Seated   Neck Retraction  10 reps    Neck Retraction Limitations  Cues for technique    Cervical Rotation  5 reps    Cervical Rotation Limitations  seated with emphasis on good posture  prior to rotation    Other Seated Exercise  Seated thoracic extension over back of chair (self thoracic mob) with use of towel roll.  Performed x 5 reps with 5-10 sec hold.  Added to HEP    Other Seated Exercise  Attempted seated SNAG for improved rotation, however when performing, pt tends to strain neck into motion causing increased spasm, therefore did not provide for home.       Shoulder Exercises: Supine   Other Supine Exercises  Supine shoulder retraction with shoulder ER x 10 reps       Manual Therapy   Manual Therapy  Joint mobilization;Soft tissue mobilization;Myofascial release    Manual therapy comments  For decreased pain and improved flexibility    Joint Mobilization  Supine Grade III upglides throughout entire cervical spine to increase cervical rotation.  Supine lateral glide Grade II/III mobs to entire cervical spine.  Prone upper thoracic and C7 PA mobiliazation into Grade III.  Pt tolerated well.  Supine first rib mobilization.     Soft tissue  mobilization  Soft tissue mobilization to B levator and L scalene muscles     Myofascial Release  L levator trigger point release             PT Education - 08/15/17 1507    Education provided  Yes    Education Details  added self thoracic mob    Person(s) Educated  Patient    Methods  Explanation    Comprehension  Verbalized understanding       PT Short Term Goals - 07/31/17 1256      PT SHORT TERM GOAL #1   Title  The patient will return demo HEP for neck flexibility, general strength/conditioning.    Time  4    Period  Weeks      PT SHORT TERM GOAL #2   Title  The patient will improve neck extension AROM from 15 degrees to > or equal to 24 degrees     Time  4    Period  Weeks      PT SHORT TERM GOAL #3   Title  The patient will return demonstrate standing with RW with improved postural awareness performing shoulder depression and improved hand position to reduce pain with walker use.    Time  4    Period  Weeks      PT SHORT TERM GOAL #4   Title  Further assess standing balance and LTG to follow.    Baseline  Pt feels that balance is at baseline    Time  4    Period  Weeks    Status  Deferred        PT Long Term Goals - 07/31/17 1256      PT LONG TERM GOAL #1   Title  The patient will verbalize understanding of community wellness for post d/c activities.    Time  8    Period  Weeks      PT LONG TERM GOAL #2   Title  The patient will improve neck AROM from 50 degrees bilateral rotation to > or equal to 58 degrees bilaterally.    Time  8    Period  Weeks      PT LONG TERM GOAL #3   Title  The patient will improve cervical sidebending from 10 degrees bilat to > or equal to 15 degrees.    Time  8    Period  Weeks  PT LONG TERM GOAL #4   Title  The patient will have LTG for balance once assessed.    Baseline  Pt feels balance is at baseline    Time  8    Period  Weeks    Status  Deferred            Plan - 08/15/17 1508    Clinical  Impression Statement  Skilled session focused on B cervical and scapular soft tissue mobilization, cerivcal mobilization and therex to decrease pain, improve ROM/mobility, and strengthen back musclature.  Pt continues to make progress and is more aware of posture during mobility.      PT Treatment/Interventions  ADLs/Self Care Home Management;Balance training;Neuromuscular re-education;Vestibular;Therapeutic exercise;Therapeutic activities;Manual techniques;Gait training;Patient/family education;Dry needling    PT Next Visit Plan  check HEP, thoracic self mobilization, extended gait, neck ROM/flexibility and strengthening    Consulted and Agree with Plan of Care  Patient;Family member/caregiver    Family Member Consulted  spouse       Patient will benefit from skilled therapeutic intervention in order to improve the following deficits and impairments:  Abnormal gait, Hypomobility, Pain, Decreased balance, Decreased mobility, Impaired flexibility  Visit Diagnosis: Neck pain     Problem List Patient Active Problem List   Diagnosis Date Noted  . Whiplash 06/10/2017  . Cough 05/29/2017  . Concussion 05/29/2017  . Hammer toes of both feet 12/19/2016  . Allergic rhinitis 12/09/2016  . Pedal edema 09/17/2016  . Chest pain 07/18/2016  . Gross hematuria 04/26/2016  . Hyperglycemia 03/21/2016  . Dysuria 12/07/2015  . Fever blister 12/07/2015  . Anxiety and depression 06/30/2015  . Encounter for well adult exam with abnormal findings 06/30/2015  . Neck mass 03/24/2015  . Hypothyroidism 03/24/2015  . Gout 03/24/2015  . Essential hypertension 03/24/2015  . Asthma 03/24/2015  . Peripheral neuropathy 03/24/2015  . Osteoporosis 03/24/2015  . OSA (obstructive sleep apnea) 03/24/2015  . Morbid obesity ([place]) 03/24/2015  . Long term current use of anticoagulant therapy 03/16/2015  . Lymphadenopathy, cervical 03/16/2015  . Diarrhea 03/16/2015  . Hyperbilirubinemia 03/16/2015  . Coronary  arteriosclerosis 09/22/2014  . Hyperlipidemia 07/23/2013  . Hypokalemia 02/18/2013  . Elevated bilirubin 02/18/2013  . Plantar fasciitis   . Arthritis   . Allergy   . Hx of radiation therapy   . Atrial fibrillation ([place]) 01/21/2011  . Malignant neoplasm of upper-outer quadrant of left breast in female, estrogen receptor positive ([place]) 12/26/2010  . 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 [place], [place], [address] Phone: [phone]   Fax:  [phone] 08/15/17, 3:11 PM  Name: [name] MRN: [id number] Date of Birth: [birth year]

## 2017-08-15 NOTE — Patient Instructions (Signed)
    Seated thoracic Extension  - Find a seat with a low and rigid back. - Sit all the way back in your seat with you feet flat on the floor.  - Reach hands behind your head and extend thoracic spine over the back rest - * Be sure to "chin tuck" to avoid cervical extension  Hold for 5-10 secs.  Do 5 reps, 2 times per day.

## 2017-08-18 ENCOUNTER — Encounter: Payer: Self-pay | Admitting: Rehabilitation

## 2017-08-18 ENCOUNTER — Ambulatory Visit: Payer: Medicare Other | Admitting: Rehabilitation

## 2017-08-18 DIAGNOSIS — M542 Cervicalgia: Secondary | ICD-10-CM | POA: Diagnosis not present

## 2017-08-18 DIAGNOSIS — R2689 Other abnormalities of gait and mobility: Secondary | ICD-10-CM | POA: Diagnosis not present

## 2017-08-18 NOTE — Therapy (Signed)
[facility] [address] [place], [place], [address] Phone: [phone]   Fax:  [phone]  Physical Therapy Treatment  Patient Details  Name: [name] MRN: [id number] Date of Birth: [birth year] Referring Provider: Hulan Saas, DO   Encounter Date: 08/18/2017  PT End of Session - 08/18/17 1635    Visit Number  5    Number of Visits  12    Date for PT Re-Evaluation  09/22/17    Authorization Type  [employer] medicare $40 copay    PT Start Time  1315    PT Stop Time  1400    PT Time Calculation (min)  45 min    Activity Tolerance  Patient tolerated treatment well    Behavior During Therapy  [facility] for tasks assessed/performed       Past Medical History:  Diagnosis Date  . Allergy   . Anxiety   . Arthritis    no cartlidge in knees  . Asthma   . Bradycardia    a. atenolol stopped due to this, HR 40s.  . 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 CAD in native artery    a.  CAD s/p RCA stent 2000 with residual mild-mod disease of left system 2001.  Marland Kitchen Chronic atrial fibrillation ([place])   . COPD (chronic obstructive pulmonary disease) ([place])   . Essential hypertension 03/24/2015  . Gout    post operative  . Hx of radiation therapy 04/02/11 -05/20/11   left breast  . Hyperlipidemia   . Hypothyroidism 03/24/2015  . Incontinence of urine   . Malignant neoplasm of upper-outer quadrant of female breast ([place]) 12/26/2010  . Osteoporosis 03/24/2015  . Peripheral neuropathy 03/24/2015  . Plantar fasciitis   . Sleep apnea     Past Surgical History:  Procedure Laterality Date  . BREAST LUMPECTOMY W/ NEEDLE LOCALIZATION  01/29/2011   Left with SLN Dr Margot Chimes  . CHOLECYSTECTOMY  1955  . CORONARY ANGIOPLASTY WITH STENT PLACEMENT  2000  . DILATION AND CURETTAGE OF UTERUS  1976   and 1996  . HAND SURGERY  1992   tendons between thumb and forefinger  . SKIN BIOPSY     1994, 2006, 2008, 2010, 2011, 2011,  2012-  pre-cancerous  . TONSILLECTOMY  1955    There were no vitals filed for this visit.  Subjective Assessment - 08/18/17 1320    Subjective  Pt reports she is doing well.      Pertinent History  She has recently had knee injections, walker was hurting her shoulders, left wrist replacement.  Reviewed PMH in epic.  PRIOR FUNCTIONAL STATUS:  Uses walker in the house and w/c in the community b/c she didn't want to fall.  Walker helps with knee pain, but makes shoulders worse.     Patient Stated Goals  Do what the physician said.    Currently in Pain?  No/denies    Pain Score  0-No pain                       [facility] Adult PT Treatment/Exercise - 08/18/17 1405      Exercises   Exercises  Neck      Neck Exercises: Seated   Neck Retraction  10 reps    Neck Retraction Limitations  Cues for technique    Cervical Rotation  5 reps    Cervical Rotation Limitations  seated with emphasis on good posture prior  to rotation    Other Seated Exercise  Seated thoracic extension over back of chair (self thoracic mob) with use of towel roll.  Performed x 5 reps with 5-10 sec hold.  Reviewed due to questions about HEP.       Neck Exercises: Supine   Other Supine Exercise  Self suboccipital release with tennis balls in sleeve x 3 mins. Tolerated well and gave pt for home use.     Other Supine Exercise  Seated self trigger point release using massage cane on L levator/upper trap.  Educated on how to purchase if interested but did provide educate to hold off until after dry needling.        Manual Therapy   Manual Therapy  Soft tissue mobilization;Myofascial release    Manual therapy comments  for decrease pain and improved flexibility.      Soft tissue mobilization  Soft tissue mobilization to B levator and L scalene muscles     Myofascial Release  L levator trigger point release    Manual Traction  Manual traction with use of PTs forearm x 2 reps of 1 mins.        Therex:  Also had pt  perform SNAG for improved cervical rotation.  Did to the R during session, however recommend she do both directions at home.  Note marked improvement today with carrying out task vs previous session.  Therefore added to HEP.       PT Education - 08/18/17 1634    Education provided  Yes    Education Details  added SNAG to improve cervical rotation to HEP     Person(s) Educated  Patient    Methods  Explanation;Demonstration;Handout    Comprehension  Verbalized understanding;Returned demonstration       PT Short Term Goals - 07/31/17 1256      PT SHORT TERM GOAL #1   Title  The patient will return demo HEP for neck flexibility, general strength/conditioning.    Time  4    Period  Weeks      PT SHORT TERM GOAL #2   Title  The patient will improve neck extension AROM from 15 degrees to > or equal to 24 degrees     Time  4    Period  Weeks      PT SHORT TERM GOAL #3   Title  The patient will return demonstrate standing with RW with improved postural awareness performing shoulder depression and improved hand position to reduce pain with walker use.    Time  4    Period  Weeks      PT SHORT TERM GOAL #4   Title  Further assess standing balance and LTG to follow.    Baseline  Pt feels that balance is at baseline    Time  4    Period  Weeks    Status  Deferred        PT Long Term Goals - 07/31/17 1256      PT LONG TERM GOAL #1   Title  The patient will verbalize understanding of community wellness for post d/c activities.    Time  8    Period  Weeks      PT LONG TERM GOAL #2   Title  The patient will improve neck AROM from 50 degrees bilateral rotation to > or equal to 58 degrees bilaterally.    Time  8    Period  Weeks      PT LONG  TERM GOAL #3   Title  The patient will improve cervical sidebending from 10 degrees bilat to > or equal to 15 degrees.    Time  8    Period  Weeks      PT LONG TERM GOAL #4   Title  The patient will have LTG for balance once assessed.     Baseline  Pt feels balance is at baseline    Time  8    Period  Weeks    Status  Deferred            Plan - 08/18/17 1635    Clinical Impression Statement  Pt doing much better overall, however continues to be limited with cervical rotation, esp to the R.  Re-checked cervical rotation today and note that she has had 4 deg loss with R rotation (46 deg) and gain of 5 deg to the L (57 deg).  Recommended dry needling for pt at [facility] to decrease muscle tension and pain in L levator/upper trap region.  Pt agreeable and therefore had her schedule.      PT Treatment/Interventions  ADLs/Self Care Home Management;Balance training;Neuromuscular re-education;Vestibular;Therapeutic exercise;Therapeutic activities;Manual techniques;Gait training;Patient/family education;Dry needling    PT Next Visit Plan  STGs, check SNAG from last session, check HEP, thoracic self mobilization, extended gait, neck ROM/flexibility and strengthening    Consulted and Agree with Plan of Care  Patient;Family member/caregiver    Family Member Consulted  spouse       Patient will benefit from skilled therapeutic intervention in order to improve the following deficits and impairments:  Abnormal gait, Hypomobility, Pain, Decreased balance, Decreased mobility, Impaired flexibility  Visit Diagnosis: Neck pain     Problem List Patient Active Problem List   Diagnosis Date Noted  . Whiplash 06/10/2017  . Cough 05/29/2017  . Concussion 05/29/2017  . Hammer toes of both feet 12/19/2016  . Allergic rhinitis 12/09/2016  . Pedal edema 09/17/2016  . Chest pain 07/18/2016  . Gross hematuria 04/26/2016  . Hyperglycemia 03/21/2016  . Dysuria 12/07/2015  . Fever blister 12/07/2015  . Anxiety and depression 06/30/2015  . Encounter for well adult exam with abnormal findings 06/30/2015  . Neck mass 03/24/2015  . Hypothyroidism 03/24/2015  . Gout 03/24/2015  . Essential hypertension 03/24/2015  . Asthma 03/24/2015  .  Peripheral neuropathy 03/24/2015  . Osteoporosis 03/24/2015  . OSA (obstructive sleep apnea) 03/24/2015  . Morbid obesity ([place]) 03/24/2015  . Long term current use of anticoagulant therapy 03/16/2015  . Lymphadenopathy, cervical 03/16/2015  . Diarrhea 03/16/2015  . Hyperbilirubinemia 03/16/2015  . Coronary arteriosclerosis 09/22/2014  . Hyperlipidemia 07/23/2013  . Hypokalemia 02/18/2013  . Elevated bilirubin 02/18/2013  . Plantar fasciitis   . Arthritis   . Allergy   . Hx of radiation therapy   . Atrial fibrillation ([place]) 01/21/2011  . Malignant neoplasm of upper-outer quadrant of left breast in female, estrogen receptor positive ([place]) 12/26/2010  . 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 [place], [place], [address] Phone: [phone]   Fax:  [phone] 08/18/17, 4:39 PM  Name: [name] MRN: [id number] Date of Birth: Mar 20, 1936

## 2017-08-18 NOTE — Patient Instructions (Signed)
    SNAG  Using a towel draped around your neck, begin with one hand stabilizing one end of towel with light downward pull. Next, turn your head as far to one direction as able (as in picture). Use opposite hand to gently pull towel across your face making sure not to pull across your jaw to allow for light stretch into rotational direction.  You can do both directions, but mainly targeting right rotation.  Hold for 2-3 secs and repeat x 10 reps.  Do 1-2 times per day.

## 2017-08-25 ENCOUNTER — Encounter: Payer: Self-pay | Admitting: Rehabilitation

## 2017-08-25 ENCOUNTER — Ambulatory Visit: Payer: Medicare Other | Admitting: Rehabilitation

## 2017-08-25 DIAGNOSIS — M542 Cervicalgia: Secondary | ICD-10-CM

## 2017-08-25 DIAGNOSIS — R2689 Other abnormalities of gait and mobility: Secondary | ICD-10-CM | POA: Diagnosis not present

## 2017-08-25 NOTE — Patient Instructions (Signed)
PELVIC TILT: Posterior    Tighten abdominals, flatten low back. _10__ reps per set (hold for 5 secs each), _1-2__ sets per day, until back is feeling better.    Copyright  VHI. All rights reserved.    Knee to Chest    Lying supine, bend involved knee to chest _2__ times on each side and hold for 30 secs.  You can use towel under knee to help pull to avoid straining. Repeat with other leg. Do 1-2___ times per day.  Copyright  VHI. All rights reserved.    Lower Trunk Rotation / Pelvic Opener    Lean on back with knees bent, spine rotated, knees together on one side. Slowly rotate to other side.  Do not open legs, keep them together.  Hold each position _5__ seconds. Repeat __10_ times. Do 1-2___ sessions per day.  Copyright  VHI. All rights reserved.

## 2017-08-25 NOTE — Therapy (Signed)
[facility] [address] [place], [place], [address] Phone: [phone]   Fax:  [phone]  Physical Therapy Treatment  Patient Details  Name: [name] MRN: [id number] Date of Birth: [birth year] Referring Provider: Hulan Saas, DO   Encounter Date: 08/25/2017  PT End of Session - 08/25/17 1541    Visit Number  6    Number of Visits  12    Date for PT Re-Evaluation  09/22/17    Authorization Type  [employer] medicare $40 copay    PT Start Time  1531    PT Stop Time  1615    PT Time Calculation (min)  44 min    Activity Tolerance  Patient tolerated treatment well    Behavior During Therapy  [facility] for tasks assessed/performed       Past Medical History:  Diagnosis Date  . Allergy   . Anxiety   . Arthritis    no cartlidge in knees  . Asthma   . Bradycardia    a. atenolol stopped due to this, HR 40s.  . 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 CAD in native artery    a.  CAD s/p RCA stent 2000 with residual mild-mod disease of left system 2001.  Marland Kitchen Chronic atrial fibrillation ([place])   . COPD (chronic obstructive pulmonary disease) ([place])   . Essential hypertension 03/24/2015  . Gout    post operative  . Hx of radiation therapy 04/02/11 -05/20/11   left breast  . Hyperlipidemia   . Hypothyroidism 03/24/2015  . Incontinence of urine   . Malignant neoplasm of upper-outer quadrant of female breast ([place]) 12/26/2010  . Osteoporosis 03/24/2015  . Peripheral neuropathy 03/24/2015  . Plantar fasciitis   . Sleep apnea     Past Surgical History:  Procedure Laterality Date  . BREAST LUMPECTOMY W/ NEEDLE LOCALIZATION  01/29/2011   Left with SLN Dr Margot Chimes  . CHOLECYSTECTOMY  1955  . CORONARY ANGIOPLASTY WITH STENT PLACEMENT  2000  . DILATION AND CURETTAGE OF UTERUS  1976   and 1996  . HAND SURGERY  1992   tendons between thumb and forefinger  . SKIN BIOPSY     1994, 2006, 2008, 2010, 2011, 2011,  2012-  pre-cancerous  . TONSILLECTOMY  1955    There were no vitals filed for this visit.  Subjective Assessment - 08/25/17 1538    Subjective  Pt reports she strained low back over the weekend moving clothes.  Feels like it catches with certain movements.     Pertinent History  She has recently had knee injections, walker was hurting her shoulders, left wrist replacement.  Reviewed PMH in epic.  PRIOR FUNCTIONAL STATUS:  Uses walker in the house and w/c in the community b/c she didn't want to fall.  Walker helps with knee pain, but makes shoulders worse.     Patient Stated Goals  Do what the physician said.    Currently in Pain?  Yes    Pain Score  10-Worst pain ever when catches, otherwise, is dull ache    Pain Location  Back    Pain Orientation  Lower    Pain Descriptors / Indicators  Shooting    Pain Type  Acute pain    Pain Onset  In the past 7 days    Pain Frequency  Constant not constant spasm    Aggravating Factors   certain movements     Pain Relieving Factors  not doing certain movements         [facility] PT Assessment - 08/25/17 0001      AROM   Cervical Extension  26                   [facility] Adult PT Treatment/Exercise - 08/25/17 0001      Ambulation/Gait   Ambulation/Gait  Yes    Ambulation/Gait Assistance  6: Modified independent (Device/Increase time)    Ambulation/Gait Assistance Details  Had pt ambulate x 115' with RW to assess STG for improved posture.  Note that she is markedly more upright than previously and demonstrates increased attention to posture during all mobility.      Ambulation Distance (Feet)  115 Feet    Assistive device  Rolling walker    Gait Pattern  Step-through pattern;Trunk flexed;Wide base of support    Ambulation Surface  Level;Indoor      Exercises   Exercises  Lumbar      Lumbar Exercises: Stretches   Active Hamstring Stretch  Right;Left;1 rep;30 seconds    Active Hamstring Stretch Limitations  seated with cues for  posture    Single Knee to Chest Stretch  Right;Left;1 rep;30 seconds    Single Knee to Chest Stretch Limitations  with use of towel for increased stretch    Lower Trunk Rotation  Other (comment) 10 reps with 2-3 sec hold    Lower Trunk Rotation Limitations  Cues for technique    Pelvic Tilt  10 reps;5 seconds    Pelvic Tilt Limitations  PTs hand under low back for improved technique       Also briefly went over bed mobility using log roll technique to avoid overt trunk twisting/bending that increases pain. Pt return demo during session.       PT Education - 08/25/17 1541    Education provided  Yes    Education Details  low back HEP to perform until pain decreases    Person(s) Educated  Patient    Methods  Explanation;Demonstration;Handout    Comprehension  Verbalized understanding;Returned demonstration       PT Short Term Goals - 08/25/17 1543      PT SHORT TERM GOAL #1   Title  The patient will return demo HEP for neck flexibility, general strength/conditioning.    Baseline  met per pt report (we have gone over exercises periodically in previous sessions).     Time  4    Period  Weeks    Status  Achieved      PT SHORT TERM GOAL #2   Title  The patient will improve neck extension AROM from 15 degrees to > or equal to 24 degrees     Baseline  26 deg of neck ext on 08/25/17    Time  4    Period  Weeks    Status  Achieved      PT SHORT TERM GOAL #3   Title  The patient will return demonstrate standing with RW with improved postural awareness performing shoulder depression and improved hand position to reduce pain with walker use.    Baseline  met 08/25/17    Time  4    Period  Weeks    Status  Achieved      PT SHORT TERM GOAL #4   Title  Further assess standing balance and LTG to follow.    Baseline  Pt feels that balance is at baseline    Time  4  Period  Weeks    Status  Deferred        PT Long Term Goals - 07/31/17 1256      PT LONG TERM GOAL #1   Title   The patient will verbalize understanding of community wellness for post d/c activities.    Time  8    Period  Weeks      PT LONG TERM GOAL #2   Title  The patient will improve neck AROM from 50 degrees bilateral rotation to > or equal to 58 degrees bilaterally.    Time  8    Period  Weeks      PT LONG TERM GOAL #3   Title  The patient will improve cervical sidebending from 10 degrees bilat to > or equal to 15 degrees.    Time  8    Period  Weeks      PT LONG TERM GOAL #4   Title  The patient will have LTG for balance once assessed.    Baseline  Pt feels balance is at baseline    Time  8    Period  Weeks    Status  Deferred            Plan - 08/25/17 1952    Clinical Impression Statement  Skilled session focused on assessment of STGs and new onset of low back pain/muscle strain.  Note that she has met 3/3 STGs and reports marked improvement in pain and self reported ROM with mobility.  Provided several exercises for low back stretching to address new onset of low back pain.  Pt tolerated well.      PT Treatment/Interventions  ADLs/Self Care Home Management;Balance training;Neuromuscular re-education;Vestibular;Therapeutic exercise;Therapeutic activities;Manual techniques;Gait training;Patient/family education;Dry needling    PT Next Visit Plan  Follow up on back pain, check SNAG from last session, Christina-I have her going to get [facility] needling at [facility] next week and so I think we can probably wrap up here 4/29 and let her continue at [place], but see what you think.   neck ROM/flexibility and strengthening    Consulted and Agree with Plan of Care  Patient;Family member/caregiver    Family Member Consulted  spouse       Patient will benefit from skilled therapeutic intervention in order to improve the following deficits and impairments:  Abnormal gait, Hypomobility, Pain, Decreased balance, Decreased mobility, Impaired flexibility  Visit Diagnosis: Neck  pain     Problem List Patient Active Problem List   Diagnosis Date Noted  . Whiplash 06/10/2017  . Cough 05/29/2017  . Concussion 05/29/2017  . Hammer toes of both feet 12/19/2016  . Allergic rhinitis 12/09/2016  . Pedal edema 09/17/2016  . Chest pain 07/18/2016  . Gross hematuria 04/26/2016  . Hyperglycemia 03/21/2016  . Dysuria 12/07/2015  . Fever blister 12/07/2015  . Anxiety and depression 06/30/2015  . Encounter for well adult exam with abnormal findings 06/30/2015  . Neck mass 03/24/2015  . Hypothyroidism 03/24/2015  . Gout 03/24/2015  . Essential hypertension 03/24/2015  . Asthma 03/24/2015  . Peripheral neuropathy 03/24/2015  . Osteoporosis 03/24/2015  . OSA (obstructive sleep apnea) 03/24/2015  . Morbid obesity ([place]) 03/24/2015  . Long term current use of anticoagulant therapy 03/16/2015  . Lymphadenopathy, cervical 03/16/2015  . Diarrhea 03/16/2015  . Hyperbilirubinemia 03/16/2015  . Coronary arteriosclerosis 09/22/2014  . Hyperlipidemia 07/23/2013  . Hypokalemia 02/18/2013  . Elevated bilirubin 02/18/2013  . Plantar fasciitis   . Arthritis   .  Allergy   . Hx of radiation therapy   . Atrial fibrillation ([place]) 01/21/2011  . Malignant neoplasm of upper-outer quadrant of left breast in female, estrogen receptor positive ([place]) 12/26/2010  . 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 [place], [place], [address] Phone: [phone]   Fax:  [phone] 08/25/17, 8:01 PM  Name: [name] MRN: [id number] Date of Birth: 1935-12-24

## 2017-08-28 ENCOUNTER — Ambulatory Visit: Payer: Medicare Other | Admitting: Rehabilitative and Restorative Service Providers"

## 2017-08-28 VITALS — BP 143/93 | HR 71

## 2017-08-28 DIAGNOSIS — M542 Cervicalgia: Secondary | ICD-10-CM | POA: Diagnosis not present

## 2017-08-28 DIAGNOSIS — R2689 Other abnormalities of gait and mobility: Secondary | ICD-10-CM

## 2017-08-28 NOTE — Therapy (Signed)
[facility] [address] [place], [place], [address] Phone: [phone]   Fax:  [phone]  Physical Therapy Treatment  Patient Details  Name: [name] MRN: [id number] Date of Birth: Sep 04, 1935 Referring Provider: Hulan Saas, DO   Encounter Date: 08/28/2017  PT End of Session - 08/28/17 1426    Visit Number  7    Number of Visits  12    Date for PT Re-Evaluation  09/22/17    Authorization Type  [employer] medicare $40 copay    PT Start Time  9326    PT Stop Time  1450    PT Time Calculation (min)  45 min    Activity Tolerance  Patient tolerated treatment well    Behavior During Therapy  [facility] for tasks assessed/performed       Past Medical History:  Diagnosis Date  . Allergy   . Anxiety   . Arthritis    no cartlidge in knees  . Asthma   . Bradycardia    a. atenolol stopped due to this, HR 40s.  . 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 CAD in native artery    a.  CAD s/p RCA stent 2000 with residual mild-mod disease of left system 2001.  Marland Kitchen Chronic atrial fibrillation ([place])   . COPD (chronic obstructive pulmonary disease) ([place])   . Essential hypertension 03/24/2015  . Gout    post operative  . Hx of radiation therapy 04/02/11 -05/20/11   left breast  . Hyperlipidemia   . Hypothyroidism 03/24/2015  . Incontinence of urine   . Malignant neoplasm of upper-outer quadrant of female breast ([place]) 12/26/2010  . Osteoporosis 03/24/2015  . Peripheral neuropathy 03/24/2015  . Plantar fasciitis   . Sleep apnea     Past Surgical History:  Procedure Laterality Date  . BREAST LUMPECTOMY W/ NEEDLE LOCALIZATION  01/29/2011   Left with [facility] Dr Margot Chimes  . CHOLECYSTECTOMY  1955  . CORONARY ANGIOPLASTY WITH STENT PLACEMENT  2000  . DILATION AND CURETTAGE OF UTERUS  1976   and 1996  . HAND SURGERY  1992   tendons between thumb and forefinger  . SKIN BIOPSY     1994, 2006, 2008, 2010, 2011, 2011,  2012-  pre-cancerous  . TONSILLECTOMY  1955    Vitals:   08/28/17 1414 08/28/17 1452  BP: (!) 169/98 (!) 143/93  Pulse: 71     Subjective Assessment - 08/28/17 1414    Subjective  The patient reports they have been having their house painted and are staying at their daughter's house.  She notes she woke up feeling good at 8am, went back to bed and got up at 12:30 and then woke up not feeling like she is breathing right.  "I think it's just exhaustion all over".  She notes she had to walk with a cane in her house due to mattresses being in the hall b/c of painting the house.  "I feel weak".    The patient is no longer getting headaches.      Pertinent History  She has recently had knee injections, walker was hurting her shoulders, left wrist replacement.  Reviewed PMH in epic.  PRIOR FUNCTIONAL STATUS:  Uses walker in the house and w/c in the community b/c she didn't want to fall.  Walker helps with knee pain, but makes shoulders worse.     Patient Stated Goals  Do what the physician said.    Currently  in Pain?  Yes    Pain Score  2  "at the most".  She reports her back is tight and tired, but not as painful as it was last session.    Pain Location  Neck    Pain Orientation  Lower    Pain Type  Acute pain    Pain Onset  More than a month ago    Pain Frequency  Constant    Aggravating Factors   certain movements    Pain Relieving Factors  not doing certain movements         [facility] PT Assessment - 08/28/17 1429      AROM   Overall AROM Comments  Measured prior to intervention.    Cervical - Right Rotation  50    Cervical - Left Rotation  55                   [facility] Adult PT Treatment/Exercise - 08/28/17 1456      Exercises   Exercises  Other Exercises    Other Exercises   After manual therapy:  patient has 65 degrees right rotation, 60 degrees right rotation      Manual Therapy   Manual Therapy  Joint mobilization;Soft tissue mobilization;Myofascial release;Manual  Traction;Passive ROM;Muscle Energy Technique    Manual therapy comments  For increased range of motion, improved musle lengthening, improved flexibility    Joint Mobilization  Supine grade II-III upglides mid cervical spine right and left sides.  Grade I-II lateral glides.  Sustained glides with patient's movement into cervical flexion to improve end range mobility,    Soft tissue mobilization  Soft tissue mobilization for suboccipitals *patient got the most relief today from suboccipital release*, left upper trap and left levator soft tissue mobilization    Myofascial Release  Trigger point release left levator and suboccipital rellease    Passive ROM  into bilateral rotation and cervical flexion with rotation with passive overpressure within tolerable range    Manual Traction  Supine gentle manual traction x 3 minutes    Muscle Energy Technique  contract/ relax at end range of rotation for carryover of stretch               PT Short Term Goals - 08/25/17 1543      PT SHORT TERM GOAL #1   Title  The patient will return demo HEP for neck flexibility, general strength/conditioning.    Baseline  met per pt report (we have gone over exercises periodically in previous sessions).     Time  4    Period  Weeks    Status  Achieved      PT SHORT TERM GOAL #2   Title  The patient will improve neck extension AROM from 15 degrees to > or equal to 24 degrees     Baseline  26 deg of neck ext on 08/25/17    Time  4    Period  Weeks    Status  Achieved      PT SHORT TERM GOAL #3   Title  The patient will return demonstrate standing with RW with improved postural awareness performing shoulder depression and improved hand position to reduce pain with walker use.    Baseline  met 08/25/17    Time  4    Period  Weeks    Status  Achieved      PT SHORT TERM GOAL #4   Title  Further assess standing balance and LTG to follow.  Baseline  Pt feels that balance is at baseline    Time  4    Period   Weeks    Status  Deferred        PT Long Term Goals - 07/31/17 1256      PT LONG TERM GOAL #1   Title  The patient will verbalize understanding of community wellness for post d/c activities.    Time  8    Period  Weeks      PT LONG TERM GOAL #2   Title  The patient will improve neck AROM from 50 degrees bilateral rotation to > or equal to 58 degrees bilaterally.    Time  8    Period  Weeks      PT LONG TERM GOAL #3   Title  The patient will improve cervical sidebending from 10 degrees bilat to > or equal to 15 degrees.    Time  8    Period  Weeks      PT LONG TERM GOAL #4   Title  The patient will have LTG for balance once assessed.    Baseline  Pt feels balance is at baseline    Time  8    Period  Weeks    Status  Deferred            Plan - 08/28/17 1519    Clinical Impression Statement  The patient notes significant benefit today with manual techniques noting reduced pain and 10 degree gain of rotation to right and left sides with emphasis on suboccipital release, and lengthening of c-spine with traction.  The patient subjectively reports significant benefit from PT for posture with dec'd HA since consussion and improved overall function.   Carryover of improved ROM between sessions will hopefully be improved with dry needling to reduce soft tissue guarding.    PT Treatment/Interventions  ADLs/Self Care Home Management;Balance training;Neuromuscular re-education;Vestibular;Therapeutic exercise;Therapeutic activities;Manual techniques;Gait training;Patient/family education;Dry needling    PT Next Visit Plan  Review HEP on Monday, manual techniques, then transition to [facility] for dry needling.  *Patient requests to f/u with neuro clinic (emily or Rosario Duey) one more session after dry needling for goal check on 5/20.  Emphasize suboccipitals as upper cervical focus yielded gains for rotation today.  Measure sidebending and continue with tissue lengthening/stretching.     Consulted and Agree with Plan of Care  Patient;Family member/caregiver    Family Member Consulted  spouse       Patient will benefit from skilled therapeutic intervention in order to improve the following deficits and impairments:  Abnormal gait, Hypomobility, Pain, Decreased balance, Decreased mobility, Impaired flexibility  Visit Diagnosis: Neck pain  Other abnormalities of gait and mobility     Problem List Patient Active Problem List   Diagnosis Date Noted  . Whiplash 06/10/2017  . Cough 05/29/2017  . Concussion 05/29/2017  . Hammer toes of both feet 12/19/2016  . Allergic rhinitis 12/09/2016  . Pedal edema 09/17/2016  . Chest pain 07/18/2016  . Gross hematuria 04/26/2016  . Hyperglycemia 03/21/2016  . Dysuria 12/07/2015  . Fever blister 12/07/2015  . Anxiety and depression 06/30/2015  . Encounter for well adult exam with abnormal findings 06/30/2015  . Neck mass 03/24/2015  . Hypothyroidism 03/24/2015  . Gout 03/24/2015  . Essential hypertension 03/24/2015  . Asthma 03/24/2015  . Peripheral neuropathy 03/24/2015  . Osteoporosis 03/24/2015  . OSA (obstructive sleep apnea) 03/24/2015  . Morbid obesity ([place]) 03/24/2015  . Long term current use  of anticoagulant therapy 03/16/2015  . Lymphadenopathy, cervical 03/16/2015  . Diarrhea 03/16/2015  . Hyperbilirubinemia 03/16/2015  . Coronary arteriosclerosis 09/22/2014  . Hyperlipidemia 07/23/2013  . Hypokalemia 02/18/2013  . Elevated bilirubin 02/18/2013  . Plantar fasciitis   . Arthritis   . Allergy   . Hx of radiation therapy   . Atrial fibrillation ([place]) 01/21/2011  . Malignant neoplasm of upper-outer quadrant of left breast in female, estrogen receptor positive ([place]) 12/26/2010  . 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 [place], [place], [address] Phone: [phone]    Fax:  [phone]  Name: [name] MRN: [id number] Date of Birth: 1935/07/14

## 2017-09-01 ENCOUNTER — Encounter: Payer: Self-pay | Admitting: Rehabilitation

## 2017-09-01 ENCOUNTER — Ambulatory Visit: Payer: Medicare Other | Admitting: Rehabilitation

## 2017-09-01 DIAGNOSIS — R2689 Other abnormalities of gait and mobility: Secondary | ICD-10-CM | POA: Diagnosis not present

## 2017-09-01 DIAGNOSIS — M542 Cervicalgia: Secondary | ICD-10-CM | POA: Diagnosis not present

## 2017-09-01 NOTE — Patient Instructions (Addendum)
NECK: Rotation    Do this sitting!  Make sure shoulders stay down! Rotate head to look over shoulder. _10__ reps per set, __2_ sets per day, _5-7__ days per week. Hold for 2-3 secs each direction.   Copyright  VHI. All rights reserved.  Axial Extension (Chin Tuck)    Do this sitting up, feet flat on floor and good posture.  Pull chin in and lengthen back of neck. Hold __5__ seconds while counting out loud. Repeat __10__ times. Do __2__ sessions per day.  http://gt2.exer.us/450  Copyright  VHI. All rights reserved.  External Rotation (Passive)    Lying on back with BOTH elbows bent to 90 degrees.  Gently rotate arms out to the side and hold for 5 seconds.  Repeat 10 times.  BOTH ARMS WILL MOVE TOGETHER. Copyright  VHI. All rights reserved.    EXTENSION: Supine - Elbow Extended (Isometric)    Lie on back, right arm at side on surface. Press arm down into surface with elbow straight. Hold __5_ seconds. Complete __1_ sets of __10-15_ repetitions. Perform _1-2__ sessions per day.  CAN DO BOTH ARMS AT THE SAME TIME. Copyright  VHI. All rights reserved.      Seated thoracic Extension  - Find a seat with a low and rigid back. - Sit all the way back in your seat with you feet flat on the floor.  - Reach hands behind your head and extend thoracic spine over the back rest - * Be sure to "chin tuck" to avoid cervical extension  Hold for 5-10 secs.  Do 5 reps, 2 times per day.      SNAG  Using a towel draped around your neck, begin with one hand stabilizing one end of towel with light downward pull. Next, turn your head as far to one direction as able (as in picture). Use opposite hand to gently pull towel across your face making sure not to pull across your jaw to allow for light stretch into rotational direction.  You can do both directions, but mainly targeting right rotation.  Hold for 2-3 secs and repeat x 10 reps.  Do 1-2 times per day.

## 2017-09-01 NOTE — Therapy (Signed)
[facility] [address] [place], [place], [address] Phone: [phone]   Fax:  [phone]  Physical Therapy Treatment  Patient Details  Name: RYNLEE LISBON MRN: [id number] Date of Birth: 25-Nov-1935 Referring Provider: Hulan Saas, DO   Encounter Date: 09/01/2017  PT End of Session - 09/01/17 2023    Visit Number  8    Number of Visits  12    Date for PT Re-Evaluation  09/22/17    Authorization Type  [employer] medicare $40 copay    PT Start Time  1448    PT Stop Time  1530    PT Time Calculation (min)  42 min    Activity Tolerance  Patient tolerated treatment well    Behavior During Therapy  [facility] for tasks assessed/performed       Past Medical History:  Diagnosis Date  . Allergy   . Anxiety   . Arthritis    no cartlidge in knees  . Asthma   . Bradycardia    a. atenolol stopped due to this, HR 40s.  . 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 CAD in native artery    a.  CAD s/p RCA stent 2000 with residual mild-mod disease of left system 2001.  Marland Kitchen Chronic atrial fibrillation ([place])   . COPD (chronic obstructive pulmonary disease) ([place])   . Essential hypertension 03/24/2015  . Gout    post operative  . Hx of radiation therapy 04/02/11 -05/20/11   left breast  . Hyperlipidemia   . Hypothyroidism 03/24/2015  . Incontinence of urine   . 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 Osteoporosis 03/24/2015  . Peripheral neuropathy 03/24/2015  . Plantar fasciitis   . Sleep apnea     Past Surgical History:  Procedure Laterality Date  . BREAST LUMPECTOMY W/ NEEDLE LOCALIZATION  01/29/2011   Left with [facility] Dr Margot Chimes  . CHOLECYSTECTOMY  1955  . CORONARY ANGIOPLASTY WITH STENT PLACEMENT  2000  . DILATION AND CURETTAGE OF UTERUS  1976   and 1996  . HAND SURGERY  1992   tendons between thumb and forefinger  . SKIN BIOPSY     1994, 2006, 2008, 2010, 2011, 2011,  2012-  pre-cancerous  . TONSILLECTOMY  1955    There were no vitals filed for this visit.  Subjective Assessment - 09/01/17 1455    Subjective  House is almost done being painted but is still very tired from last week.      Pertinent History  She has recently had knee injections, walker was hurting her shoulders, left wrist replacement.  Reviewed PMH in epic.  PRIOR FUNCTIONAL STATUS:  Uses walker in the house and w/c in the community b/c she didn't want to fall.  Walker helps with knee pain, but makes shoulders worse.     Patient Stated Goals  Do what the physician said.    Currently in Pain?  Yes    Pain Score  2     Pain Location  Neck    Pain Orientation  Right;Left    Pain Descriptors / Indicators  Tightness    Pain Type  Acute pain    Pain Onset  More than a month ago    Pain Frequency  Constant    Aggravating Factors   certain movement     Pain Relieving Factors  not doing certain movements.  [facility] Adult PT Treatment/Exercise - 09/01/17 0001      Exercises   Exercises  Other Exercises    Other Exercises   Reviewed HEP and updated cervical rotation and chin tuck to be performed in sitting as she does well without compensation. See pt instruction for details.  Did not take baseline ROM, however following manual therapy, note R cervical rotation was 64 deg, and L cervical rotation was 68 deg.       Manual Therapy   Manual Therapy  Joint mobilization;Soft tissue mobilization;Myofascial release;Manual Traction    Manual therapy comments  For increased range of motion, improved musle lengthening, improved flexibility    Joint Mobilization  Performed Grade II/III upglides to cervical spine (supine), supine L upglide/R downglide with movement x 5 reps to increase R rotation, placed pt in grade V cervical manipulation position (lateral flex, lateral glide and rotation) for mobilization purposes x 3 reps on each side.  Pt tolerated well and reports  feeling improved ROM.  In seated position, performed mid cervical PA with traction with movement into R rotation.  See ROM measures following MT>     Soft tissue mobilization  suboccipital release, soft tissue mobilization to L levator, R scalenes    Myofascial Release  Trigger point release to L levator, R first rib mob    Manual Traction  cervical traction with use of PTs forearm for increased pull.  Pt tolerated well.               PT Education - 09/01/17 2023    Education provided  Yes    Education Details  benefits of dry needling    Person(s) Educated  Patient    Methods  Explanation    Comprehension  Verbalized understanding       PT Short Term Goals - 08/25/17 1543      PT SHORT TERM GOAL #1   Title  The patient will return demo HEP for neck flexibility, general strength/conditioning.    Baseline  met per pt report (we have gone over exercises periodically in previous sessions).     Time  4    Period  Weeks    Status  Achieved      PT SHORT TERM GOAL #2   Title  The patient will improve neck extension AROM from 15 degrees to > or equal to 24 degrees     Baseline  26 deg of neck ext on 08/25/17    Time  4    Period  Weeks    Status  Achieved      PT SHORT TERM GOAL #3   Title  The patient will return demonstrate standing with RW with improved postural awareness performing shoulder depression and improved hand position to reduce pain with walker use.    Baseline  met 08/25/17    Time  4    Period  Weeks    Status  Achieved      PT SHORT TERM GOAL #4   Title  Further assess standing balance and LTG to follow.    Baseline  Pt feels that balance is at baseline    Time  4    Period  Weeks    Status  Deferred        PT Long Term Goals - 07/31/17 1256      PT LONG TERM GOAL #1   Title  The patient will verbalize understanding of community wellness for post d/c activities.    Time  8    Period  Weeks      PT LONG TERM GOAL #2   Title  The patient will  improve neck AROM from 50 degrees bilateral rotation to > or equal to 58 degrees bilaterally.    Time  8    Period  Weeks      PT LONG TERM GOAL #3   Title  The patient will improve cervical sidebending from 10 degrees bilat to > or equal to 15 degrees.    Time  8    Period  Weeks      PT LONG TERM GOAL #4   Title  The patient will have LTG for balance once assessed.    Baseline  Pt feels balance is at baseline    Time  8    Period  Weeks    Status  Deferred            Plan - 09/01/17 2024    Clinical Impression Statement  Pt continues to have significant benefit from manual therapy techniques with reduced pain and continued improvement in ROM (cervical rotation).  Feel that muscle tightness on L>R paracervical muscles preventing further ROM and therefore she will receive dry needling to address in next few weeks.     PT Treatment/Interventions  ADLs/Self Care Home Management;Balance training;Neuromuscular re-education;Vestibular;Therapeutic exercise;Therapeutic activities;Manual techniques;Gait training;Patient/family education;Dry needling    PT Next Visit Plan  Dry needling (L levator, can you all do R scalenes-unsure) Add goals as needed. She requested to have one more follow up with neuro at end of DN,  Emphasize suboccipitals as upper cervical focus yielded gains for rotation today.  Measure sidebending and continue with tissue lengthening/stretching.    Consulted and Agree with Plan of Care  Patient;Family member/caregiver    Family Member Consulted  spouse       Patient will benefit from skilled therapeutic intervention in order to improve the following deficits and impairments:  Abnormal gait, Hypomobility, Pain, Decreased balance, Decreased mobility, Impaired flexibility  Visit Diagnosis: Neck pain  Other abnormalities of gait and mobility     Problem List Patient Active Problem List   Diagnosis Date Noted  . Whiplash 06/10/2017  . Cough 05/29/2017  .  Concussion 05/29/2017  . Hammer toes of both feet 12/19/2016  . Allergic rhinitis 12/09/2016  . Pedal edema 09/17/2016  . Chest pain 07/18/2016  . Gross hematuria 04/26/2016  . Hyperglycemia 03/21/2016  . Dysuria 12/07/2015  . Fever blister 12/07/2015  . Anxiety and depression 06/30/2015  . Encounter for well adult exam with abnormal findings 06/30/2015  . Neck mass 03/24/2015  . Hypothyroidism 03/24/2015  . Gout 03/24/2015  . Essential hypertension 03/24/2015  . Asthma 03/24/2015  . Peripheral neuropathy 03/24/2015  . Osteoporosis 03/24/2015  . OSA (obstructive sleep apnea) 03/24/2015  . Morbid obesity ([place]) 03/24/2015  . Long term current use of anticoagulant therapy 03/16/2015  . Lymphadenopathy, cervical 03/16/2015  . Diarrhea 03/16/2015  . Hyperbilirubinemia 03/16/2015  . Coronary arteriosclerosis 09/22/2014  . Hyperlipidemia 07/23/2013  . Hypokalemia 02/18/2013  . Elevated bilirubin 02/18/2013  . Plantar fasciitis   . Arthritis   . Allergy   . Hx of radiation therapy   . Atrial fibrillation ([place]) 01/21/2011  . Malignant neoplasm of upper-outer quadrant of left breast in female, estrogen receptor positive ([place]) 12/26/2010  . 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 [place], [place], [address]  Phone: [phone]   Fax:  [phone] 09/01/17, 8:28 PM  Name: ASHER BABILONIA MRN: [id number] Date of Birth: Dec 24, 1935

## 2017-09-03 ENCOUNTER — Encounter: Payer: Self-pay | Admitting: Physical Therapy

## 2017-09-03 ENCOUNTER — Ambulatory Visit: Payer: Medicare Other | Attending: Family Medicine | Admitting: Physical Therapy

## 2017-09-03 DIAGNOSIS — M542 Cervicalgia: Secondary | ICD-10-CM | POA: Diagnosis not present

## 2017-09-03 DIAGNOSIS — R2689 Other abnormalities of gait and mobility: Secondary | ICD-10-CM | POA: Insufficient documentation

## 2017-09-03 NOTE — Therapy (Signed)
[place], [place], [address] Phone: [phone]   Fax:  [phone]  Physical Therapy Treatment  Patient Details  Name: [name] MRN: [id number] Date of Birth: 10-04-1935 Referring Provider: Hulan Saas, DO   Encounter Date: 09/03/2017  PT End of Session - 09/03/17 1633    Visit Number  9    Number of Visits  12    Date for PT Re-Evaluation  09/22/17    PT Start Time  8938    PT Stop Time  1633    PT Time Calculation (min)  47 min    Activity Tolerance  Patient tolerated treatment well    Behavior During Therapy  [facility] for tasks assessed/performed       Past Medical History:  Diagnosis Date  . Allergy   . Anxiety   . Arthritis    no cartlidge in knees  . Asthma   . Bradycardia    a. atenolol stopped due to this, HR 40s.  . 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 CAD in native artery    a.  CAD s/p RCA stent 2000 with residual mild-mod disease of left system 2001.  Marland Kitchen Chronic atrial fibrillation ([place])   . COPD (chronic obstructive pulmonary disease) ([place])   . Essential hypertension 03/24/2015  . Gout    post operative  . Hx of radiation therapy 04/02/11 -05/20/11   left breast  . Hyperlipidemia   . Hypothyroidism 03/24/2015  . Incontinence of urine   . Malignant neoplasm of upper-outer quadrant of female breast ([place]) 12/26/2010  . Osteoporosis 03/24/2015  . Peripheral neuropathy 03/24/2015  . Plantar fasciitis   . Sleep apnea     Past Surgical History:  Procedure Laterality Date  . BREAST LUMPECTOMY W/ NEEDLE LOCALIZATION  01/29/2011   Left with [facility] Dr Margot Chimes  . CHOLECYSTECTOMY  1955  . CORONARY ANGIOPLASTY WITH STENT PLACEMENT  2000  . DILATION AND CURETTAGE OF UTERUS  1976   and 1996  . HAND SURGERY  1992   tendons between thumb and forefinger  . SKIN BIOPSY     1994, 2006, 2008, 2010, 2011, 2011, 2012-  pre-cancerous  . TONSILLECTOMY  1955    There were  no vitals filed for this visit.  Subjective Assessment - 09/03/17 1553    Subjective  "my neck has retightened since the last session, and I tweaked my back this morning. I am unsure if I was getting up and had spasm"     Currently in Pain?  Yes    Pain Score  2  4-5/10    Pain Location  Neck    Pain Orientation  Left    Pain Onset  More than a month ago    Pain Frequency  Constant         [facility] PT Assessment - 09/03/17 1636      AROM   Cervical - Right Side Bend  15    Cervical - Left Side Bend  16                   [facility] Adult PT Treatment/Exercise - 09/03/17 1637      Neck Exercises: Seated   Other Seated Exercise  upper cervical rotation 2 x 10 holding end ranges x 3 sec      Manual Therapy   Manual therapy comments  skilled palpation and monitoring pt throughout TPDN    Joint Mobilization  grade 4 L first rib mobs    Soft tissue mobilization  IASTM along L upper trap/ levator scapulae, and Scalenes    Myofascial Release  fascial rolling and stretching along upper trap/ levator and Scalenes      Neck Exercises: Stretches   Upper Trapezius Stretch  2 reps;30 seconds visual cues for proper form       Trigger Point Dry Needling - 09/03/17 1556    Consent Given?  Yes    Education Handout Provided  Yes    Muscles Treated Upper Body  Upper trapezius;Levator scapulae;Sternocleidomastoid L Scalenes    Sternocleidomastoid Response  Twitch response elicited;Palpable increased muscle length L only    Upper Trapezius Response  Twitch reponse elicited;Palpable increased muscle length L only    Levator Scapulae Response  Twitch response elicited;Palpable increased muscle length L only           PT Education - 09/03/17 1632    Education provided  Yes    Education Details  muscle anatomy and referral patterns. What TPDN is and benefits, What to expect and after care.     Person(s) Educated  Patient    Methods  Explanation;Verbal cues;Handout    Comprehension   Verbalized understanding;Verbal cues required       PT Short Term Goals - 08/25/17 1543      PT SHORT TERM GOAL #1   Title  The patient will return demo HEP for neck flexibility, general strength/conditioning.    Baseline  met per pt report (we have gone over exercises periodically in previous sessions).     Time  4    Period  Weeks    Status  Achieved      PT SHORT TERM GOAL #2   Title  The patient will improve neck extension AROM from 15 degrees to > or equal to 24 degrees     Baseline  26 deg of neck ext on 08/25/17    Time  4    Period  Weeks    Status  Achieved      PT SHORT TERM GOAL #3   Title  The patient will return demonstrate standing with RW with improved postural awareness performing shoulder depression and improved hand position to reduce pain with walker use.    Baseline  met 08/25/17    Time  4    Period  Weeks    Status  Achieved      PT SHORT TERM GOAL #4   Title  Further assess standing balance and LTG to follow.    Baseline  Pt feels that balance is at baseline    Time  4    Period  Weeks    Status  Deferred        PT Long Term Goals - 09/03/17 1639      PT LONG TERM GOAL #5   Title  pt to reduce L upper trap/ levator tightness to reduce pain to </= 2/10 (during movement) and promote cervical mobility     Time  2    Status  New    Target Date  09/17/17            Plan - 09/03/17 1634    Clinical Impression Statement  pt reports 2/10 in the L upper trap that increased to 4-5/10 with L cervical rotation. Educated and performed TPDN along the L upper trap/ levatator scapulae, Scalenes, and SCM. Followed TPDN with IASTM techniques and rib mobs. continued stretching which she performed  well. End of session pt reported she felt better and was able to rotate her farther to the L.     PT Treatment/Interventions  ADLs/Self Care Home Management;Balance training;Neuromuscular re-education;Vestibular;Therapeutic exercise;Therapeutic activities;Manual  techniques;Gait training;Patient/family education;Dry needling    PT Next Visit Plan  assess response to DN, and thoracic mobility, scapular stabliity, posture educaction She requested to have one more follow up with neuro at end of DN,  Emphasize suboccipitals as upper cervical focus yielded gains for rotation today.  Measure sidebending and continue with tissue lengthening/stretching.    Consulted and Agree with Plan of Care  Patient;Family member/caregiver    Family Member Consulted  spouse       Patient will benefit from skilled therapeutic intervention in order to improve the following deficits and impairments:  Abnormal gait, Hypomobility, Pain, Decreased balance, Decreased mobility, Impaired flexibility  Visit Diagnosis: Neck pain  Other abnormalities of gait and mobility     Problem List Patient Active Problem List   Diagnosis Date Noted  . Whiplash 06/10/2017  . Cough 05/29/2017  . Concussion 05/29/2017  . Hammer toes of both feet 12/19/2016  . Allergic rhinitis 12/09/2016  . Pedal edema 09/17/2016  . Chest pain 07/18/2016  . Gross hematuria 04/26/2016  . Hyperglycemia 03/21/2016  . Dysuria 12/07/2015  . Fever blister 12/07/2015  . Anxiety and depression 06/30/2015  . Encounter for well adult exam with abnormal findings 06/30/2015  . Neck mass 03/24/2015  . Hypothyroidism 03/24/2015  . Gout 03/24/2015  . Essential hypertension 03/24/2015  . Asthma 03/24/2015  . Peripheral neuropathy 03/24/2015  . Osteoporosis 03/24/2015  . OSA (obstructive sleep apnea) 03/24/2015  . Morbid obesity ([place]) 03/24/2015  . Long term current use of anticoagulant therapy 03/16/2015  . Lymphadenopathy, cervical 03/16/2015  . Diarrhea 03/16/2015  . Hyperbilirubinemia 03/16/2015  . Coronary arteriosclerosis 09/22/2014  . Hyperlipidemia 07/23/2013  . Hypokalemia 02/18/2013  . Elevated bilirubin 02/18/2013  . Plantar fasciitis   . Arthritis   . Allergy   . Hx of radiation therapy   .  Atrial fibrillation ([place]) 01/21/2011  . Malignant neoplasm of upper-outer quadrant of left breast in female, estrogen receptor positive ([place]) 12/26/2010  . Breast cancer ([facility]) Receptor + her2 _ 12/26/2010   Starr Lake PT, DPT, LAT, ATC  09/03/17  4:41 PM      [facility] [address] [place], [place], [address] Phone: [phone]   Fax:  [phone]  Name: TEQULIA GONSALVES MRN: [id number] Date of Birth: Jul 08, 1935

## 2017-09-04 ENCOUNTER — Other Ambulatory Visit: Payer: Self-pay

## 2017-09-04 MED ORDER — LEVOCETIRIZINE DIHYDROCHLORIDE 5 MG PO TABS: 5.0000 mg | ORAL_TABLET | Freq: Every day | ORAL | 5 refills | Status: DC

## 2017-09-08 ENCOUNTER — Ambulatory Visit: Payer: Medicare Other | Admitting: Physical Therapy

## 2017-09-08 ENCOUNTER — Encounter: Payer: Self-pay | Admitting: Physical Therapy

## 2017-09-08 DIAGNOSIS — R2689 Other abnormalities of gait and mobility: Secondary | ICD-10-CM

## 2017-09-08 DIAGNOSIS — M542 Cervicalgia: Secondary | ICD-10-CM | POA: Diagnosis not present

## 2017-09-08 NOTE — Therapy (Signed)
[place], [place], [address] Phone: [phone]   Fax:  [phone]  Physical Therapy Treatment  Patient Details  Name: [name] MRN: [id number] Date of Birth: 11-02-1935 Referring Provider: Hulan Saas, DO   Encounter Date: 09/08/2017  PT End of Session - 09/08/17 1431    Visit Number  10    Number of Visits  12    Date for PT Re-Evaluation  09/22/17    PT Start Time  8099 pt arrived 10 min late    PT Stop Time  1504    PT Time Calculation (min)  39 min    Activity Tolerance  Patient tolerated treatment well    Behavior During Therapy  [facility] for tasks assessed/performed       Past Medical History:  Diagnosis Date  . Allergy   . Anxiety   . Arthritis    no cartlidge in knees  . Asthma   . Bradycardia    a. atenolol stopped due to this, HR 40s.  . 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 CAD in native artery    a.  CAD s/p RCA stent 2000 with residual mild-mod disease of left system 2001.  Marland Kitchen Chronic atrial fibrillation ([place])   . COPD (chronic obstructive pulmonary disease) ([place])   . Essential hypertension 03/24/2015  . Gout    post operative  . Hx of radiation therapy 04/02/11 -05/20/11   left breast  . Hyperlipidemia   . Hypothyroidism 03/24/2015  . Incontinence of urine   . Malignant neoplasm of upper-outer quadrant of female breast ([place]) 12/26/2010  . Osteoporosis 03/24/2015  . Peripheral neuropathy 03/24/2015  . Plantar fasciitis   . Sleep apnea     Past Surgical History:  Procedure Laterality Date  . BREAST LUMPECTOMY W/ NEEDLE LOCALIZATION  01/29/2011   Left with [facility] Dr Margot Chimes  . CHOLECYSTECTOMY  1955  . CORONARY ANGIOPLASTY WITH STENT PLACEMENT  2000  . DILATION AND CURETTAGE OF UTERUS  1976   and 1996  . HAND SURGERY  1992   tendons between thumb and forefinger  . SKIN BIOPSY     1994, 2006, 2008, 2010, 2011, 2011, 2012-  pre-cancerous  . TONSILLECTOMY   1955    There were no vitals filed for this visit.  Subjective Assessment - 09/08/17 1425    Subjective  "i felt the lasted for a little while, I was pretty sore which was fine. I did noticed the pain returned"     Currently in Pain?  Yes    Pain Score  3     Pain Orientation  Left    Pain Frequency  Constant                       [facility] Adult PT Treatment/Exercise - 09/08/17 1546      Shoulder Exercises: Seated   Other Seated Exercises  seated lower trap activation  2 x 10      Manual Therapy   Manual therapy comments  skilled palpation and monitoring pt throughout TPDN    Joint Mobilization  grade 4 bil first rib mobs, T1-T8 PA grade 3 mobs    Soft tissue mobilization  IASTM along L upper trap/ levator scapulae, and Scalenes    Myofascial Release  fascial rolling and stretching along upper trap/ levator and Scalenes       Trigger Point Dry Needling - 09/08/17 1552  Muscles Treated Upper Body  Suboccipitals muscle group splenus capitus bil    Upper Trapezius Response  Twitch reponse elicited;Palpable increased muscle length    SubOccipitals Response  Twitch response elicited;Palpable increased muscle length    Levator Scapulae Response  Twitch response elicited;Palpable increased muscle length             PT Short Term Goals - 08/25/17 1543      PT SHORT TERM GOAL #1   Title  The patient will return demo HEP for neck flexibility, general strength/conditioning.    Baseline  met per pt report (we have gone over exercises periodically in previous sessions).     Time  4    Period  Weeks    Status  Achieved      PT SHORT TERM GOAL #2   Title  The patient will improve neck extension AROM from 15 degrees to > or equal to 24 degrees     Baseline  26 deg of neck ext on 08/25/17    Time  4    Period  Weeks    Status  Achieved      PT SHORT TERM GOAL #3   Title  The patient will return demonstrate standing with RW with improved postural awareness  performing shoulder depression and improved hand position to reduce pain with walker use.    Baseline  met 08/25/17    Time  4    Period  Weeks    Status  Achieved      PT SHORT TERM GOAL #4   Title  Further assess standing balance and LTG to follow.    Baseline  Pt feels that balance is at baseline    Time  4    Period  Weeks    Status  Deferred        PT Long Term Goals - 09/03/17 1639      PT LONG TERM GOAL #5   Title  pt to reduce L upper trap/ levator tightness to reduce pain to </= 2/10 (during movement) and promote cervical mobility     Time  2    Status  New    Target Date  09/17/17            Plan - 09/08/17 1549    Clinical Impression Statement  pt reports continued 2-3/10 today but stated she had relief from DN last session. Continued TPDN along bil upper traps/ levator scapulae, spenius capitus and sub-occipitals. followed with IASTM techniques and thoracic/ first rib mobs. continued stretching and lower trap activation to off set upper trap tightness.     PT Treatment/Interventions  ADLs/Self Care Home Management;Balance training;Neuromuscular re-education;Vestibular;Therapeutic exercise;Therapeutic activities;Manual techniques;Gait training;Patient/family education;Dry needling    PT Next Visit Plan  assess response to DN, and thoracic mobility, scapular stabliity, posture educaction She requested to have one more follow up with neuro at end of DN,  Emphasize suboccipitals as upper cervical focus yielded gains for rotation today.  Measure sidebending and continue with tissue lengthening/stretching.    Consulted and Agree with Plan of Care  Patient       Patient will benefit from skilled therapeutic intervention in order to improve the following deficits and impairments:  Abnormal gait, Hypomobility, Pain, Decreased balance, Decreased mobility, Impaired flexibility  Visit Diagnosis: Neck pain  Other abnormalities of gait and mobility     Problem  List Patient Active Problem List   Diagnosis Date Noted  . Whiplash 06/10/2017  . Cough 05/29/2017  .  Concussion 05/29/2017  . Hammer toes of both feet 12/19/2016  . Allergic rhinitis 12/09/2016  . Pedal edema 09/17/2016  . Chest pain 07/18/2016  . Gross hematuria 04/26/2016  . Hyperglycemia 03/21/2016  . Dysuria 12/07/2015  . Fever blister 12/07/2015  . Anxiety and depression 06/30/2015  . Encounter for well adult exam with abnormal findings 06/30/2015  . Neck mass 03/24/2015  . Hypothyroidism 03/24/2015  . Gout 03/24/2015  . Essential hypertension 03/24/2015  . Asthma 03/24/2015  . Peripheral neuropathy 03/24/2015  . Osteoporosis 03/24/2015  . OSA (obstructive sleep apnea) 03/24/2015  . Morbid obesity ([place]) 03/24/2015  . Long term current use of anticoagulant therapy 03/16/2015  . Lymphadenopathy, cervical 03/16/2015  . Diarrhea 03/16/2015  . Hyperbilirubinemia 03/16/2015  . Coronary arteriosclerosis 09/22/2014  . Hyperlipidemia 07/23/2013  . Hypokalemia 02/18/2013  . Elevated bilirubin 02/18/2013  . Plantar fasciitis   . Arthritis   . Allergy   . Hx of radiation therapy   . Atrial fibrillation ([place]) 01/21/2011  . Malignant neoplasm of upper-outer quadrant of left breast in female, estrogen receptor positive ([place]) 12/26/2010  . Breast cancer ([facility]) Receptor + her2 _ 12/26/2010   Starr Lake PT, DPT, LAT, ATC  09/08/17  3:54 PM      [facility] [address] [place], [place], [address] Phone: [phone]   Fax:  [phone]  Name: WANIA LONGSTRETH MRN: [id number] Date of Birth: Jul 02, 1935

## 2017-09-12 ENCOUNTER — Encounter

## 2017-09-12 ENCOUNTER — Other Ambulatory Visit: Payer: Self-pay | Admitting: Internal Medicine

## 2017-09-15 ENCOUNTER — Other Ambulatory Visit: Payer: Self-pay

## 2017-09-15 ENCOUNTER — Ambulatory Visit: Payer: Self-pay | Admitting: *Deleted

## 2017-09-15 ENCOUNTER — Encounter (HOSPITAL_COMMUNITY): Payer: Self-pay | Admitting: Emergency Medicine

## 2017-09-15 ENCOUNTER — Emergency Department (EMERGENCY_DEPARTMENT_HOSPITAL)
Admission: EM | Admit: 2017-09-15 | Discharge: 2017-09-15 | Disposition: A | Discharge status: Home / Self Care | Payer: Medicare Other | Source: Home / Self Care | Attending: Emergency Medicine | Admitting: Emergency Medicine

## 2017-09-15 ENCOUNTER — Emergency Department (HOSPITAL_COMMUNITY): Payer: Medicare Other

## 2017-09-15 ENCOUNTER — Emergency Department (HOSPITAL_COMMUNITY)
Admission: EM | Admit: 2017-09-15 | Discharge: 2017-09-15 | Disposition: A | Discharge status: Home / Self Care | Payer: Medicare Other | Attending: Emergency Medicine | Admitting: Emergency Medicine

## 2017-09-15 DIAGNOSIS — S8012XA Contusion of left lower leg, initial encounter: Secondary | ICD-10-CM | POA: Diagnosis not present

## 2017-09-15 DIAGNOSIS — Z955 Presence of coronary angioplasty implant and graft: Secondary | ICD-10-CM | POA: Insufficient documentation

## 2017-09-15 DIAGNOSIS — Z853 Personal history of malignant neoplasm of breast: Secondary | ICD-10-CM | POA: Diagnosis not present

## 2017-09-15 DIAGNOSIS — Z79899 Other long term (current) drug therapy: Secondary | ICD-10-CM | POA: Insufficient documentation

## 2017-09-15 DIAGNOSIS — I10 Essential (primary) hypertension: Secondary | ICD-10-CM | POA: Insufficient documentation

## 2017-09-15 DIAGNOSIS — M79662 Pain in left lower leg: Secondary | ICD-10-CM | POA: Diagnosis not present

## 2017-09-15 DIAGNOSIS — J449 Chronic obstructive pulmonary disease, unspecified: Secondary | ICD-10-CM | POA: Diagnosis not present

## 2017-09-15 DIAGNOSIS — E039 Hypothyroidism, unspecified: Secondary | ICD-10-CM | POA: Insufficient documentation

## 2017-09-15 DIAGNOSIS — Z7901 Long term (current) use of anticoagulants: Secondary | ICD-10-CM | POA: Diagnosis not present

## 2017-09-15 DIAGNOSIS — L03116 Cellulitis of left lower limb: Secondary | ICD-10-CM | POA: Insufficient documentation

## 2017-09-15 DIAGNOSIS — Z87891 Personal history of nicotine dependence: Secondary | ICD-10-CM | POA: Diagnosis not present

## 2017-09-15 LAB — CBC WITH DIFFERENTIAL/PLATELET
BASOS ABS: 0 10*3/uL (ref 0.0–0.1)
Basophils Relative: 0 %
EOS PCT: 5 %
Eosinophils Absolute: 0.4 10*3/uL (ref 0.0–0.7)
HEMATOCRIT: 42.4 % (ref 36.0–46.0)
Hemoglobin: 14.1 g/dL (ref 12.0–15.0)
LYMPHS PCT: 24 %
Lymphs Abs: 1.9 10*3/uL (ref 0.7–4.0)
MCH: 32.7 pg (ref 26.0–34.0)
MCHC: 33.3 g/dL (ref 30.0–36.0)
MCV: 98.4 fL (ref 78.0–100.0)
MONOS PCT: 9 %
Monocytes Absolute: 0.7 10*3/uL (ref 0.1–1.0)
NEUTROS ABS: 4.7 10*3/uL (ref 1.7–7.7)
Neutrophils Relative %: 62 %
PLATELETS: 157 10*3/uL (ref 150–400)
RBC: 4.31 MIL/uL (ref 3.87–5.11)
RDW: 14.2 % (ref 11.5–15.5)
WBC: 7.7 10*3/uL (ref 4.0–10.5)

## 2017-09-15 LAB — I-STAT CHEM 8, ED
BUN: 15 mg/dL (ref 6–20)
CALCIUM ION: 1.15 mmol/L (ref 1.15–1.40)
CREATININE: 0.8 mg/dL (ref 0.44–1.00)
Chloride: 102 mmol/L (ref 101–111)
GLUCOSE: 107 mg/dL — AB (ref 65–99)
HCT: 40 % (ref 36.0–46.0)
HEMOGLOBIN: 13.6 g/dL (ref 12.0–15.0)
Potassium: 3.2 mmol/L — ABNORMAL LOW (ref 3.5–5.1)
Sodium: 142 mmol/L (ref 135–145)
TCO2: 29 mmol/L (ref 22–32)

## 2017-09-15 MED ORDER — CLINDAMYCIN HCL 150 MG PO CAPS: 150.0000 mg | ORAL_CAPSULE | Freq: Four times a day (QID) | ORAL | 0 refills | Status: DC

## 2017-09-15 NOTE — Telephone Encounter (Signed)
FYI

## 2017-09-15 NOTE — ED Notes (Signed)
US at bedside

## 2017-09-15 NOTE — ED Notes (Signed)
Bed: WA03 Expected date:  Expected time:  Means of arrival:  Comments: [place] for doppler

## 2017-09-15 NOTE — ED Notes (Signed)
Spoke with provider regarding pt status and complaint. Plunkett MD verbalizes will place orders.

## 2017-09-15 NOTE — ED Provider Notes (Signed)
[place] DEPT Provider Note   CSN: [id number] Arrival date & time: 09/15/17  1015     History   Chief Complaint Chief Complaint  Patient presents with  . Leg Pain    HPI [name] is a 82 y.o. female.  Patient is an 82 year old female with multiple medical problems on Xarelto who presents today with worsening redness and minimal pain in her left lower leg.  Patient states 20 years ago she had a piece of wood lodged into her leg which was never removed.  She has never had any problems with it until now.  Several days ago she started having redness and mild pain to the area.  She states is never looked like this before.  She denies any trauma to the area.  No fever, systemic illness or not feeling herself  The history is provided by the patient.  Leg Pain   This is a new problem. Episode onset: 3 days. The problem occurs constantly. The problem has been gradually worsening. The pain is present in the left lower leg. The quality of the pain is described as constant. The pain is at a severity of 4/10. The pain is moderate. Pertinent negatives include full range of motion and no stiffness. She has tried nothing for the symptoms. The treatment provided no relief. There has been no history of extremity trauma.    Past Medical History:  Diagnosis Date  . Allergy   . Anxiety   . Arthritis    no cartlidge in knees  . Asthma   . Bradycardia    a. atenolol stopped due to this, HR 40s.  . 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 CAD in native artery    a.  CAD s/p RCA stent 2000 with residual mild-mod disease of left system 2001.  Marland Kitchen Chronic atrial fibrillation ([place])   . COPD (chronic obstructive pulmonary disease) ([place])   . Essential hypertension 03/24/2015  . Gout    post operative  . Hx of radiation therapy 04/02/11 -05/20/11   left breast  . Hyperlipidemia   . Hypothyroidism 03/24/2015  . Incontinence of urine   . Malignant  neoplasm of upper-outer quadrant of female breast ([place]) 12/26/2010  . Osteoporosis 03/24/2015  . Peripheral neuropathy 03/24/2015  . Plantar fasciitis   . Sleep apnea     Patient Active Problem List   Diagnosis Date Noted  . Whiplash 06/10/2017  . Cough 05/29/2017  . Concussion 05/29/2017  . Hammer toes of both feet 12/19/2016  . Allergic rhinitis 12/09/2016  . Pedal edema 09/17/2016  . Chest pain 07/18/2016  . Gross hematuria 04/26/2016  . Hyperglycemia 03/21/2016  . Dysuria 12/07/2015  . Fever blister 12/07/2015  . Anxiety and depression 06/30/2015  . Encounter for well adult exam with abnormal findings 06/30/2015  . Neck mass 03/24/2015  . Hypothyroidism 03/24/2015  . Gout 03/24/2015  . Essential hypertension 03/24/2015  . Asthma 03/24/2015  . Peripheral neuropathy 03/24/2015  . Osteoporosis 03/24/2015  . OSA (obstructive sleep apnea) 03/24/2015  . Morbid obesity ([place]) 03/24/2015  . Long term current use of anticoagulant therapy 03/16/2015  . Lymphadenopathy, cervical 03/16/2015  . Diarrhea 03/16/2015  . Hyperbilirubinemia 03/16/2015  . Coronary arteriosclerosis 09/22/2014  . Hyperlipidemia 07/23/2013  . Hypokalemia 02/18/2013  . Elevated bilirubin 02/18/2013  . Plantar fasciitis   . Arthritis   . Allergy   . Hx of radiation therapy   . Atrial fibrillation ([place]) 01/21/2011  .  Malignant neoplasm of upper-outer quadrant of left breast in female, estrogen receptor positive ([place]) 12/26/2010  . 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 BREAST LUMPECTOMY W/ NEEDLE LOCALIZATION  01/29/2011   Left with [facility] Dr Margot Chimes  . CHOLECYSTECTOMY  1955  . CORONARY ANGIOPLASTY WITH STENT PLACEMENT  2000  . DILATION AND CURETTAGE OF UTERUS  1976   and 1996  . HAND SURGERY  1992   tendons between thumb and forefinger  . SKIN BIOPSY     1994, 2006, 2008, 2010, 2011, 2011, 2012-  pre-cancerous  . TONSILLECTOMY  1955       OB History   None      Home Medications    Prior to Admission medications   Medication Sig Start Date End Date Taking? Authorizing Provider  allopurinol (ZYLOPRIM) 300 MG tablet Take 300 mg by mouth daily. 08/13/17  Yes [provider]  ALPRAZolam Duanne Moron) 0.5 MG tablet TAKE 1 TO 2 TABLETS BY MOUTH TWICE DAILY IF NEEDED FOR ANXIETY/SLEEP 03/07/17  Yes Biagio Borg, MD  azelastine (ASTELIN) 0.1 % nasal spray Place 2 sprays into both nostrils daily. Use in each nostril as directed   Yes [provider]  azelastine (OPTIVAR) 0.05 % ophthalmic solution Place 1 drop into both eyes 2 (two) times daily. Patient taking differently: Place 1 drop into both eyes 2 (two) times daily as needed (allergies and dry eyes).  09/17/16  Yes Biagio Borg, MD  B Complex Vitamins (VITAMIN B COMPLEX PO) Take 1 tablet by mouth daily.   Yes [provider]  Biotin 5000 MCG CAPS Take 5,000 mcg by mouth daily.    Yes [provider]  budesonide-formoterol (SYMBICORT) 160-4.5 MCG/ACT inhaler Inhale 2 puffs 2 (two) times daily into the lungs.   Yes [provider]  cholecalciferol (VITAMIN D) 1000 UNITS tablet Take 1,000 Units by mouth daily.   Yes [provider]  co-enzyme Q-10 50 MG capsule Take 400 mg by mouth daily.   Yes [provider]  Cyanocobalamin (VITAMIN B-12 PO) Take 1 tablet by mouth daily.   Yes [provider]  escitalopram (LEXAPRO) 10 MG tablet TAKE 1 TABLET BY MOUTH ONCE DAILY 08/12/17  Yes Biagio Borg, MD  ezetimibe (ZETIA) 10 MG tablet Take 1/2 to 1 tablet by mouth daily as directed 03/31/17  Yes Jettie Booze, MD  gabapentin (NEURONTIN) 100 MG capsule Take 1 capsule (100 mg total) daily by mouth. Patient taking differently: Take 100 mg by mouth daily as needed (foot pain).  03/20/17  Yes Biagio Borg, MD  hydrochlorothiazide (HYDRODIURIL) 25 MG tablet take 2 tablets by mouth once daily 04/04/17  Yes Biagio Borg,  MD  levocetirizine (XYZAL) 5 MG tablet Take 1 tablet (5 mg total) by mouth at bedtime. 09/04/17  Yes Biagio Borg, MD  levothyroxine (SYNTHROID, LEVOTHROID) 75 MCG tablet Take 1 tablet (75 mcg total) daily by mouth. 03/20/17  Yes Biagio Borg, MD  metoprolol succinate (TOPROL XL) 25 MG 24 hr tablet Take 0.5 tablets (12.5 mg total) by mouth daily. 04/10/17  Yes Dunn, Dayna N, PA-C  potassium chloride SA (K-DUR,KLOR-CON) 20 MEQ tablet TAKE 1 TABLET BY MOUTH ONCE DAILY 09/12/17  Yes Biagio Borg, MD  Pyridoxine HCl (VITAMIN B-6 PO) Take 1 tablet by mouth daily.   Yes [provider]  RA ALLERGY RELIEF 180 MG tablet take 1 tablet by  mouth once daily 03/07/17  Yes Biagio Borg, MD  rivaroxaban (XARELTO) 20 MG TABS tablet Take 1 tablet (20 mg total) by mouth daily. 03/25/17  Yes Jettie Booze, MD  rosuvastatin (CRESTOR) 10 MG tablet Take 1 tablet by mouth twice a week. Patient taking differently: Take 1 tablet by mouth twice a week (Mon & Fri) 03/31/17  Yes Jettie Booze, MD  traMADol-acetaminophen (ULTRACET) 37.5-325 MG tablet Take 1-2 tablets by mouth every 6 (six) hours as needed for moderate pain or severe pain.  02/23/15  Yes [provider]  vitamin C (ASCORBIC ACID) 500 MG tablet Take 500 mg by mouth daily.   Yes [provider]  VITAMIN E PO Take 1 tablet by mouth daily.   Yes [provider]  albuterol (PROVENTIL) (2.5 MG/3ML) 0.083% nebulizer solution Take 3 mLs (2.5 mg total) by nebulization every 6 (six) hours as needed for wheezing or shortness of breath. 09/17/16   Biagio Borg, MD  buPROPion (WELLBUTRIN XL) 150 MG 24 hr tablet Take 1 tablet (150 mg total) by mouth daily. Patient not taking: Reported on 09/15/2017 06/30/15   Biagio Borg, MD  clindamycin (CLEOCIN) 150 MG capsule Take 1 capsule (150 mg total) by mouth every 6 (six) hours. 09/15/17   Blanchie Dessert, MD  colchicine (COLCRYS) 0.6 MG tablet Take 0.6 mg by mouth daily as needed  (flare  up).     [provider]  Evolocumab with Infusor ([place]) 420 MG/3.5ML SOCT Inject 1 Units into the skin every 30 (thirty) days. 04/11/17   Jettie Booze, MD  nitrofurantoin, macrocrystal-monohydrate, (MACROBID) 100 MG capsule Take 1 capsule (100 mg total) by mouth 2 (two) times daily. Patient not taking: Reported on 09/15/2017 10/04/16   Biagio Borg, MD  nystatin cream (MYCOSTATIN) Apply 1 application topically daily as needed for dry skin.  03/09/14   [provider]  potassium chloride (K-DUR) 10 MEQ tablet Take 3 tablets (30 mEq total) by mouth daily. Patient not taking: Reported on 09/15/2017 05/29/17   Biagio Borg, MD  PROAIR HFA 108 [phone] Base) MCG/ACT inhaler INHALE 2 PUFFS BY MOUTH INTO THE LUNGS AS NEEDED FOR WHEEZING/SHORTNESS OF BREATH 02/19/17   Biagio Borg, MD  triamcinolone (NASACORT AQ) 55 MCG/ACT AERO nasal inhaler Place 2 sprays daily into the nose. Patient not taking: Reported on 09/15/2017 03/20/17   Biagio Borg, MD    Family History Family History  Problem Relation Age of Onset  . Heart disease Father   . Cancer Father        intestinal polyps  . Heart attack Father   . Asthma Sister   . Heart disease Brother   . Heart attack Brother     Social History Social History   Tobacco Use  . Smoking status: Former Smoker    Packs/day: 1.00    Years: 4.00    Pack years: 4.00    Last attempt to quit: 12/05/1962    Years since quitting: 54.8  . Smokeless tobacco: Never Used  Substance Use Topics  . Alcohol use: No  . Drug use: No     Allergies   Crestor [rosuvastatin calcium]; Hydrocodone-acetaminophen; Lidocaine hcl; Lipitor [atorvastatin calcium]; Procaine; Rosuvastatin; Theophylline; Vicodin [hydrocodone-acetaminophen]; Codeine; Oxycodone-acetaminophen; Propoxyphene; Quinine derivatives; Sulfa antibiotics; Atorvastatin; Ephedrine; Cephalexin; Epinephrine; Penicillins; and Theophyllines   Review of  Systems Review of Systems  Musculoskeletal: Negative for stiffness.  All other systems reviewed and are negative.    Physical  Exam Updated Vital Signs BP (!) 159/84 (BP Location: Left Arm)   Pulse 64   Temp 98.2 F (36.8 C) (Oral)   Resp 18   SpO2 98%   Physical Exam  Constitutional: She is oriented to person, place, and time. She appears well-developed and well-nourished. No distress.  HENT:  Head: Normocephalic and atraumatic.  Mouth/Throat: Oropharynx is clear and moist.  Eyes: Pupils are equal, round, and reactive to light. Conjunctivae and EOM are normal.  Neck: Normal range of motion. Neck supple.  Cardiovascular: Normal rate, regular rhythm and intact distal pulses.  No murmur heard. Pulmonary/Chest: Effort normal and breath sounds normal. No respiratory distress. She has no wheezes. She has no rales.  Abdominal: Soft. She exhibits no distension. There is no tenderness. There is no rebound and no guarding.  Musculoskeletal: Normal range of motion. She exhibits tenderness. She exhibits no edema.       Legs: Neurological: She is alert and oriented to person, place, and time.  Skin: Skin is warm and dry. No rash noted. No erythema.  Psychiatric: She has a normal mood and affect. Her behavior is normal.  Nursing note and vitals reviewed.    ED Treatments / Results  Labs (all labs ordered are listed, but only abnormal results are displayed) Labs Reviewed  I-STAT CHEM 8, ED - Abnormal; Notable for the following components:      Result Value   Potassium 3.2 (*)    Glucose, Bld 107 (*)    All other components within normal limits  CBC WITH DIFFERENTIAL/PLATELET    EKG None  Radiology Dg Tibia/fibula Left  Result Date: 09/15/2017 CLINICAL DATA:  Left calf pain and bruising after injury. EXAM: LEFT TIBIA AND FIBULA - 2 VIEW COMPARISON:  Radiographs of June 26, 2006. FINDINGS: There is no evidence of fracture or other focal bone lesions. Soft tissues are  unremarkable. IMPRESSION: Normal left tibia and fibula. Electronically Signed   By: Marijo Conception, M.D.   On: 09/15/2017 14:27   Preliminary notes--Left lower extremity venous duplex exam completed. Negative for DVT. A 1.31X2.98X0.84cm heterogenous non-vascular structure noted anterior tibial region where patient states most pain and swelling. 1.91x2.23x0.90cm hyperechoic complex structure seen at groin area.  Results notified triage RN Morey Hummingbird.  Hongying Cole (RDMS RVT)    Procedures Procedures (including critical care time)  Medications Ordered in ED Medications - No data to display   Initial Impression / Assessment and Plan / ED Course  I have reviewed the triage vital signs and the nursing notes.  Pertinent labs & imaging results that were available during my care of the patient were reviewed by me and considered in my medical decision making (see chart for details).     Patient presenting with signs and symptoms most consistent with a developing cellulitis.  Patient's DVT scan was negative for DVT but did show nonvascular structure in the anterior tibial area which is most likely the wood that is still retained.  There is no open skin or drainage.  Vital signs are reassuring.  Will start patient on clindamycin and have her follow-up with her PCP on Thursday.  If area continues she may need to see general surgery for foreign body removal.  Final Clinical Impressions(s) / ED Diagnoses   Final diagnoses:  Cellulitis of left lower extremity    ED Discharge Orders        Ordered    clindamycin (CLEOCIN) 150 MG capsule  Every 6 hours     09/15/17  [place], MD 09/15/17 1512

## 2017-09-15 NOTE — Progress Notes (Signed)
Preliminary notes--Left lower extremity venous duplex exam completed. Negative for DVT. A 1.31X2.98X0.84cm heterogenous non-vascular structure noted anterior tibial region where patient states most pain and swelling. 1.91x2.23x0.90cm hyperechoic complex structure seen at groin area.  Results notified triage RN Morey Hummingbird.  Hongying Landry Mellow (RDMS RVT) 09/15/17 12:58 PM

## 2017-09-15 NOTE — Telephone Encounter (Signed)
Pt called because she hit her knee while getting in the bed on Saturday night. She now has a bruise from the her knee to her ankle that now is purple in color. Pt also states her leg is cool to touch. It was warm the day before. She had been keeping it elevated. She is advised to go to the ED to be assessed, per protocol. [facility] at [facility] notified.  Pt will be going [facility] Emergency department.  Reason for Disposition . [1] Bruise on head/face, chest, or abdomen AND [2]  taking Coumadin (warfarin) or other strong blood thinner, or known bleeding disorder (e.g., thrombocytopenia)  Answer Assessment - Initial Assessment Questions 1. APPEARANCE of BRUISE: "Describe the bruise."      Purple bruised on the left leg 2. SIZE: "How large is the bruise?"      Large, below the knee to the ankle 3. NUMBER: "How many bruises are there?"      one 4. LOCATION: "Where is the bruise located?"      Left knee with a 3 inch circle 5. ONSET: "How long ago did the bruise occur?"      Saturday night 6. CAUSE: "Tell me how it happened."      Bumped knee getting into the bed 7. MEDICAL HISTORY: "Do you have any medical problems that can cause easy bruising or bleeding?" (e.g., leukemia, liver disease, recent chemotherapy)     no 8. MEDICATIONS : "Do you take any medications which thin the blood such as: aspirin, heparin, ibuprofen (NSAIDS), Plavix, or Coumadin?"     xarelto  9. OTHER SYMPTOMS: "Do you have any other symptoms?"  (e.g., weakness, dizziness, pain, fever, nosebleed, blood in urine/stool)     No  10. PREGNANCY: "Is there any chance you are pregnant?" "When was your last menstrual period?"       no  Protocols used: BRUISES-A-AH

## 2017-09-15 NOTE — Discharge Instructions (Signed)
Elevated the leg when you can.  Return to the ER if you develop worsening redness in your leg, fever or worsening pain.

## 2017-09-15 NOTE — ED Triage Notes (Signed)
Pt complaint of left calf bruising, redness, and pain post "brushing on bed."

## 2017-09-16 ENCOUNTER — Ambulatory Visit: Payer: Medicare Other | Admitting: Physical Therapy

## 2017-09-16 ENCOUNTER — Encounter: Payer: Self-pay | Admitting: Physical Therapy

## 2017-09-16 DIAGNOSIS — M542 Cervicalgia: Secondary | ICD-10-CM

## 2017-09-16 DIAGNOSIS — R2689 Other abnormalities of gait and mobility: Secondary | ICD-10-CM | POA: Diagnosis not present

## 2017-09-16 NOTE — Therapy (Signed)
[place], [place], [address] Phone: [phone]   Fax:  [phone]  Physical Therapy Treatment  Patient Details  Name: [name] MRN: [id number] Date of Birth: 1935-11-12 Referring Provider: Hulan Saas, DO   Encounter Date: 09/16/2017  PT End of Session - 09/16/17 0932    Visit Number  11    Number of Visits  12    Date for PT Re-Evaluation  09/22/17    PT Start Time  0846    PT Stop Time  0928    PT Time Calculation (min)  42 min    Activity Tolerance  Patient tolerated treatment well    Behavior During Therapy  [facility] for tasks assessed/performed       Past Medical History:  Diagnosis Date  . Allergy   . Anxiety   . Arthritis    no cartlidge in knees  . Asthma   . Bradycardia    a. atenolol stopped due to this, HR 40s.  . 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 CAD in native artery    a.  CAD s/p RCA stent 2000 with residual mild-mod disease of left system 2001.  Marland Kitchen Chronic atrial fibrillation ([place])   . COPD (chronic obstructive pulmonary disease) ([place])   . Essential hypertension 03/24/2015  . Gout    post operative  . Hx of radiation therapy 04/02/11 -05/20/11   left breast  . Hyperlipidemia   . Hypothyroidism 03/24/2015  . Incontinence of urine   . Malignant neoplasm of upper-outer quadrant of female breast ([place]) 12/26/2010  . Osteoporosis 03/24/2015  . Peripheral neuropathy 03/24/2015  . Plantar fasciitis   . Sleep apnea     Past Surgical History:  Procedure Laterality Date  . BREAST LUMPECTOMY W/ NEEDLE LOCALIZATION  01/29/2011   Left with [facility] Dr Margot Chimes  . CHOLECYSTECTOMY  1955  . CORONARY ANGIOPLASTY WITH STENT PLACEMENT  2000  . DILATION AND CURETTAGE OF UTERUS  1976   and 1996  . HAND SURGERY  1992   tendons between thumb and forefinger  . SKIN BIOPSY     1994, 2006, 2008, 2010, 2011, 2011, 2012-  pre-cancerous  . TONSILLECTOMY  1955    There  were no vitals filed for this visit.  Subjective Assessment - 09/16/17 0859    Subjective  "pt reports going to the Ed yesterday due to having possible DVT and was dx with cellulitis, Which she reports she is feeling better today but is waiting for test result. She reports the DN has really helped her neck"    Currently in Pain?  No/denies    Pain Score  0-No pain    Pain Orientation  Left    Aggravating Factors   specific movement         [facility] PT Assessment - 09/16/17 0935      AROM   Cervical Flexion  32    Cervical Extension  32    Cervical - Right Side Bend  18    Cervical - Left Side Bend  20    Cervical - Right Rotation  55    Cervical - Left Rotation  56                   [facility] Adult PT Treatment/Exercise - 09/16/17 0907      Neck Exercises: Seated   Cervical Isometrics  Right rotation;Left rotation;5 reps;5 secs;Flexion MAI  Shoulder Exercises: Seated   Other Seated Exercises  lower trap Y's with sustained horiztiontal abduction 2 x 10      Manual Therapy   Manual therapy comments  MTPR along bil upper trap/ levator scapulae and scalenes how to perofrm at home and taught spouse     Joint Mobilization  rib mobs grade 4 updated for HEP      Neck Exercises: Stretches   Upper Trapezius Stretch  2 reps;30 seconds    Levator Stretch  2 reps;30 seconds             PT Education - 09/16/17 0921    Education Details  reviewed MTPR and taught husband how to perform and techniques. updated HEP for rib  mobs    Person(s) Educated  Patient;Spouse    Methods  Explanation;Verbal cues;Handout    Comprehension  Verbalized understanding;Verbal cues required       PT Short Term Goals - 08/25/17 1543      PT SHORT TERM GOAL #1   Title  The patient will return demo HEP for neck flexibility, general strength/conditioning.    Baseline  met per pt report (we have gone over exercises periodically in previous sessions).     Time  4    Period  Weeks     Status  Achieved      PT SHORT TERM GOAL #2   Title  The patient will improve neck extension AROM from 15 degrees to > or equal to 24 degrees     Baseline  26 deg of neck ext on 08/25/17    Time  4    Period  Weeks    Status  Achieved      PT SHORT TERM GOAL #3   Title  The patient will return demonstrate standing with RW with improved postural awareness performing shoulder depression and improved hand position to reduce pain with walker use.    Baseline  met 08/25/17    Time  4    Period  Weeks    Status  Achieved      PT SHORT TERM GOAL #4   Title  Further assess standing balance and LTG to follow.    Baseline  Pt feels that balance is at baseline    Time  4    Period  Weeks    Status  Deferred        PT Long Term Goals - 09/03/17 1639      PT LONG TERM GOAL #5   Title  pt to reduce L upper trap/ levator tightness to reduce pain to </= 2/10 (during movement) and promote cervical mobility     Time  2    Status  New    Target Date  09/17/17            Plan - 09/16/17 0932    Clinical Impression Statement  [name] reports no pain today and has made significant improvement with TPDN today. Educated how to perofrm MTPR at home and taught spouse techniques. she was able to perofrm exercises with loop for L wrist to prevent pain. end of session she reported no pain and minimal tightness. pt is to return back to neuro with primary PT for re-assessment and potential D/C.     PT Next Visit Plan  follow up with primary PT, potential D/C. ROM / goals,     Consulted and Agree with Plan of Care  Patient;Family member/caregiver    Family Member Consulted  spouse  Patient will benefit from skilled therapeutic intervention in order to improve the following deficits and impairments:  Abnormal gait, Hypomobility, Pain, Decreased balance, Decreased mobility, Impaired flexibility  Visit Diagnosis: Neck pain  Other abnormalities of gait and mobility     Problem  List Patient Active Problem List   Diagnosis Date Noted  . Whiplash 06/10/2017  . Cough 05/29/2017  . Concussion 05/29/2017  . Hammer toes of both feet 12/19/2016  . Allergic rhinitis 12/09/2016  . Pedal edema 09/17/2016  . Chest pain 07/18/2016  . Gross hematuria 04/26/2016  . Hyperglycemia 03/21/2016  . Dysuria 12/07/2015  . Fever blister 12/07/2015  . Anxiety and depression 06/30/2015  . Encounter for well adult exam with abnormal findings 06/30/2015  . Neck mass 03/24/2015  . Hypothyroidism 03/24/2015  . Gout 03/24/2015  . Essential hypertension 03/24/2015  . Asthma 03/24/2015  . Peripheral neuropathy 03/24/2015  . Osteoporosis 03/24/2015  . OSA (obstructive sleep apnea) 03/24/2015  . Morbid obesity ([place]) 03/24/2015  . Long term current use of anticoagulant therapy 03/16/2015  . Lymphadenopathy, cervical 03/16/2015  . Diarrhea 03/16/2015  . Hyperbilirubinemia 03/16/2015  . Coronary arteriosclerosis 09/22/2014  . Hyperlipidemia 07/23/2013  . Hypokalemia 02/18/2013  . Elevated bilirubin 02/18/2013  . Plantar fasciitis   . Arthritis   . Allergy   . Hx of radiation therapy   . Atrial fibrillation ([place]) 01/21/2011  . Malignant neoplasm of upper-outer quadrant of left breast in female, estrogen receptor positive ([place]) 12/26/2010  . 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

## 2017-09-18 ENCOUNTER — Encounter: Payer: Self-pay | Admitting: Internal Medicine

## 2017-09-18 ENCOUNTER — Ambulatory Visit (INDEPENDENT_AMBULATORY_CARE_PROVIDER_SITE_OTHER): Payer: Medicare Other | Admitting: Internal Medicine

## 2017-09-18 ENCOUNTER — Other Ambulatory Visit (INDEPENDENT_AMBULATORY_CARE_PROVIDER_SITE_OTHER): Payer: Medicare Other

## 2017-09-18 VITALS — BP 126/84 | HR 79 | Temp 98.5°F | Ht 66.0 in | Wt 318.0 lb

## 2017-09-18 DIAGNOSIS — E876 Hypokalemia: Secondary | ICD-10-CM | POA: Diagnosis not present

## 2017-09-18 DIAGNOSIS — R739 Hyperglycemia, unspecified: Secondary | ICD-10-CM | POA: Diagnosis not present

## 2017-09-18 DIAGNOSIS — L03116 Cellulitis of left lower limb: Secondary | ICD-10-CM | POA: Diagnosis not present

## 2017-09-18 DIAGNOSIS — E039 Hypothyroidism, unspecified: Secondary | ICD-10-CM

## 2017-09-18 DIAGNOSIS — C50412 Malignant neoplasm of upper-outer quadrant of left female breast: Secondary | ICD-10-CM

## 2017-09-18 DIAGNOSIS — Z0000 Encounter for general adult medical examination without abnormal findings: Secondary | ICD-10-CM

## 2017-09-18 DIAGNOSIS — C50419 Malignant neoplasm of upper-outer quadrant of unspecified female breast: Secondary | ICD-10-CM

## 2017-09-18 LAB — CBC WITH DIFFERENTIAL/PLATELET
BASOS ABS: 0 10*3/uL (ref 0.0–0.1)
Basophils Relative: 0.6 % (ref 0.0–3.0)
EOS PCT: 5.8 % — AB (ref 0.0–5.0)
Eosinophils Absolute: 0.4 10*3/uL (ref 0.0–0.7)
HCT: 40.6 % (ref 36.0–46.0)
Hemoglobin: 13.5 g/dL (ref 12.0–15.0)
LYMPHS PCT: 21.2 % (ref 12.0–46.0)
Lymphs Abs: 1.5 10*3/uL (ref 0.7–4.0)
MCHC: 33.2 g/dL (ref 30.0–36.0)
MCV: 96.3 fl (ref 78.0–100.0)
MONOS PCT: 11.6 % (ref 3.0–12.0)
Monocytes Absolute: 0.8 10*3/uL (ref 0.1–1.0)
NEUTROS PCT: 60.8 % (ref 43.0–77.0)
Neutro Abs: 4.2 10*3/uL (ref 1.4–7.7)
PLATELETS: 175 10*3/uL (ref 150.0–400.0)
RBC: 4.22 Mil/uL (ref 3.87–5.11)
RDW: 15.1 % (ref 11.5–15.5)
WBC: 7 10*3/uL (ref 4.0–10.5)

## 2017-09-18 LAB — URINALYSIS, ROUTINE W REFLEX MICROSCOPIC
Hgb urine dipstick: NEGATIVE
Nitrite: NEGATIVE
PH: 5.5 (ref 5.0–8.0)
RBC / HPF: NONE SEEN (ref 0–?)
Specific Gravity, Urine: >=1.03 — AB (ref 1.000–1.030)
TOTAL PROTEIN, URINE-UPE24: NEGATIVE
UROBILINOGEN UA: 0.2 (ref 0.0–1.0)
Urine Glucose: NEGATIVE

## 2017-09-18 LAB — HEPATIC FUNCTION PANEL
ALBUMIN: 3.9 g/dL (ref 3.5–5.2)
ALK PHOS: 64 U/L (ref 39–117)
ALT: 12 U/L (ref 0–35)
AST: 19 U/L (ref 0–37)
BILIRUBIN DIRECT: 0.2 mg/dL (ref 0.0–0.3)
Total Bilirubin: 1 mg/dL (ref 0.2–1.2)
Total Protein: 6.7 g/dL (ref 6.0–8.3)

## 2017-09-18 LAB — LIPID PANEL
Cholesterol: 152 mg/dL (ref 0–200)
HDL: 42 mg/dL (ref >39.00)
LDL Cholesterol: 75 mg/dL (ref 0–99)
NONHDL: 109.62
Total CHOL/HDL Ratio: 4
Triglycerides: 174 mg/dL — ABNORMAL HIGH (ref 0.0–149.0)
VLDL: 34.8 mg/dL (ref 0.0–40.0)

## 2017-09-18 LAB — HEMOGLOBIN A1C: HEMOGLOBIN A1C: 6.3 % (ref 4.6–6.5)

## 2017-09-18 LAB — TSH: TSH: 3.14 u[IU]/mL (ref 0.35–4.50)

## 2017-09-18 LAB — T4, FREE: FREE T4: 0.78 ng/dL (ref 0.60–1.60)

## 2017-09-18 NOTE — Progress Notes (Signed)
Subjective:    Patient ID: [name], female    DOB: [birth year], 82 y.o.   MRN: [id number]  HPI  Here with acute onset LLE pain, redness and swelling for 5 days with mild pain, constant, nothing makes better or worse, no fever, chills and Pt denies chest pain, increased sob or doe, wheezing, orthopnea, PND, increased LE swelling, palpitations, dizziness or syncope.  Thinks she is only taking 2 K pils per day.   Atenolol seemed to cause hair loss and prior wheezing, now improved per pt.  Denies hyper or hypo thyroid symptoms such as voice, skin or hair change.   Pt denies polydipsia, polyuria.  No other new complaint or interval hx Past Medical History:  Diagnosis Date  . Allergy   . Anxiety   . Arthritis    no cartlidge in knees  . Asthma   . Bradycardia    a. atenolol stopped due to this, HR 40s.  . 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 CAD in native artery    a.  CAD s/p RCA stent 2000 with residual mild-mod disease of left system 2001.  Marland Kitchen Chronic atrial fibrillation ([place])   . COPD (chronic obstructive pulmonary disease) ([place])   . Essential hypertension 03/24/2015  . Gout    post operative  . Hx of radiation therapy 04/02/11 -05/20/11   left breast  . Hyperlipidemia   . Hypothyroidism 03/24/2015  . Incontinence of urine   . Malignant neoplasm of upper-outer quadrant of female breast ([place]) 12/26/2010  . Osteoporosis 03/24/2015  . Peripheral neuropathy 03/24/2015  . Plantar fasciitis   . Sleep apnea    Past Surgical History:  Procedure Laterality Date  . BREAST LUMPECTOMY W/ NEEDLE LOCALIZATION  01/29/2011   Left with [facility] Dr Margot Chimes  . CHOLECYSTECTOMY  1955  . CORONARY ANGIOPLASTY WITH STENT PLACEMENT  2000  . DILATION AND CURETTAGE OF UTERUS  1976   and 1996  . HAND SURGERY  1992   tendons between thumb and forefinger  . SKIN BIOPSY     1994, 2006, 2008, 2010, 2011, 2011, 2012-  pre-cancerous  . TONSILLECTOMY  1955    reports that she quit smoking  about 54 years ago. She has a 4.00 pack-year smoking history. She has never used smokeless tobacco. She reports that she does not drink alcohol or use drugs. family history includes Asthma in her sister; Cancer in her father; Heart attack in her brother and father; Heart disease in her brother and father. Allergies  Allergen Reactions  . Crestor [Rosuvastatin Calcium]     Severe liver function problems  . Hydrocodone-Acetaminophen Anxiety and Itching  . Lidocaine Hcl Other (See Comments)    Novacaine:  Becomes very shaky  . Lipitor [Atorvastatin Calcium] Other (See Comments)    Elevated liver function   . Procaine Other (See Comments)    shaking  . Rosuvastatin Other (See Comments)    Severe liver function problems  . Theophylline Other (See Comments)    Becomes very shaky skaking  . Vicodin [Hydrocodone-Acetaminophen] Itching    Itching all over  . Codeine Nausea Only, Anxiety and Other (See Comments)    Also complained of dizziness.  Has same problems with oxycodone and hydrocodone Visual Disturbance, Balance Difficulty, Dizzy "Room Spinning; "Swimming" Balance, vision, nausea, dizzy, "swimming", "room spinning"  . Oxycodone-Acetaminophen Nausea Only, Anxiety and Other (See Comments)    Visual Disturbance, Balance Difficulty, Dizzy "Room Spinning; "Swimming"  . Propoxyphene  Anxiety, Nausea Only and Other (See Comments)    Visual Disturbance, Balance Difficulty, Dizzy "Room Spinning; "Swimming"  . Quinine Derivatives Nausea Only    dizzy  . Sulfa Antibiotics Nausea Only    Also complained of dizziness  . Atorvastatin Other (See Comments)    Severe liver function problems  . Ephedrine Other (See Comments)    shaking  . Cephalexin Rash  . Epinephrine Other (See Comments)    Patient becomes "shaky" shaking  . Penicillins Rash    Pt reported all over body rash in 1958 Has patient had a PCN reaction causing immediate rash, facial/tongue/throat swelling, SOB or lightheadedness  with hypotension:Yes Has patient had a PCN reaction causing severe rash involving mucus membranes or skin necrosis: No Has patient had a PCN reaction that required hospitalization: No Has patient had a PCN reaction occurring within the last 10 years:No    . Theophyllines Other (See Comments)    Patient becomes "shaky"   Current Outpatient Medications on File Prior to Visit  Medication Sig Dispense Refill  . albuterol (PROVENTIL) (2.5 MG/3ML) 0.083% nebulizer solution Take 3 mLs (2.5 mg total) by nebulization every 6 (six) hours as needed for wheezing or shortness of breath. 75 mL 12  . allopurinol (ZYLOPRIM) 300 MG tablet Take 300 mg by mouth daily.  1  . ALPRAZolam (XANAX) 0.5 MG tablet TAKE 1 TO 2 TABLETS BY MOUTH TWICE DAILY IF NEEDED FOR ANXIETY/SLEEP 60 tablet 3  . azelastine (ASTELIN) 0.1 % nasal spray Place 2 sprays into both nostrils daily. Use in each nostril as directed    . azelastine (OPTIVAR) 0.05 % ophthalmic solution Place 1 drop into both eyes 2 (two) times daily. (Patient taking differently: Place 1 drop into both eyes 2 (two) times daily as needed (allergies and dry eyes). ) 6 mL 12  . B Complex Vitamins (VITAMIN B COMPLEX PO) Take 1 tablet by mouth daily.    . Biotin 5000 MCG CAPS Take 5,000 mcg by mouth daily.     . budesonide-formoterol (SYMBICORT) 160-4.5 MCG/ACT inhaler Inhale 2 puffs 2 (two) times daily into the lungs.    Marland Kitchen buPROPion (WELLBUTRIN XL) 150 MG 24 hr tablet Take 1 tablet (150 mg total) by mouth daily. 90 tablet 3  . cholecalciferol (VITAMIN D) 1000 UNITS tablet Take 1,000 Units by mouth daily.    . clindamycin (CLEOCIN) 150 MG capsule Take 1 capsule (150 mg total) by mouth every 6 (six) hours. 28 capsule 0  . co-enzyme Q-10 50 MG capsule Take 400 mg by mouth daily.    . colchicine (COLCRYS) 0.6 MG tablet Take 0.6 mg by mouth daily as needed (flare  up).     . Cyanocobalamin (VITAMIN B-12 PO) Take 1 tablet by mouth daily.    Marland Kitchen escitalopram (LEXAPRO) 10 MG  tablet TAKE 1 TABLET BY MOUTH ONCE DAILY 90 tablet 2  . Evolocumab with Infusor ([place]) 420 MG/3.5ML SOCT Inject 1 Units into the skin every 30 (thirty) days.    Marland Kitchen ezetimibe (ZETIA) 10 MG tablet Take 1/2 to 1 tablet by mouth daily as directed 30 tablet 11  . gabapentin (NEURONTIN) 100 MG capsule Take 1 capsule (100 mg total) daily by mouth. (Patient taking differently: Take 100 mg by mouth daily as needed (foot pain). ) 90 capsule 3  . hydrochlorothiazide (HYDRODIURIL) 25 MG tablet take 2 tablets by mouth once daily 180 tablet 3  . levocetirizine (XYZAL) 5 MG tablet Take 1 tablet (5 mg total) by  mouth at bedtime. 30 tablet 5  . levothyroxine (SYNTHROID, LEVOTHROID) 75 MCG tablet Take 1 tablet (75 mcg total) daily by mouth. 90 tablet 3  . metoprolol succinate (TOPROL XL) 25 MG 24 hr tablet Take 0.5 tablets (12.5 mg total) by mouth daily. 45 tablet 1  . nitrofurantoin, macrocrystal-monohydrate, (MACROBID) 100 MG capsule Take 1 capsule (100 mg total) by mouth 2 (two) times daily. 20 capsule 0  . nystatin cream (MYCOSTATIN) Apply 1 application topically daily as needed for dry skin.   0  . potassium chloride (K-DUR) 10 MEQ tablet Take 3 tablets (30 mEq total) by mouth daily. 270 tablet 3  . potassium chloride SA (K-DUR,KLOR-CON) 20 MEQ tablet TAKE 1 TABLET BY MOUTH ONCE DAILY 90 tablet 1  . PROAIR HFA 108 (90 Base) MCG/ACT inhaler INHALE 2 PUFFS BY MOUTH INTO THE LUNGS AS NEEDED FOR WHEEZING/SHORTNESS OF BREATH 17 g 3  . Pyridoxine HCl (VITAMIN B-6 PO) Take 1 tablet by mouth daily.    Marland Kitchen RA ALLERGY RELIEF 180 MG tablet take 1 tablet by mouth once daily 30 tablet 5  . rivaroxaban (XARELTO) 20 MG TABS tablet Take 1 tablet (20 mg total) by mouth daily. 90 tablet 1  . rosuvastatin (CRESTOR) 10 MG tablet Take 1 tablet by mouth twice a week. (Patient taking differently: Take 1 tablet by mouth twice a week (Mon & Fri)) 27 tablet 3  . traMADol-acetaminophen (ULTRACET) 37.5-325 MG tablet Take  1-2 tablets by mouth every 6 (six) hours as needed for moderate pain or severe pain.   0  . triamcinolone (NASACORT AQ) 55 MCG/ACT AERO nasal inhaler Place 2 sprays daily into the nose. 1 Inhaler 12  . vitamin C (ASCORBIC ACID) 500 MG tablet Take 500 mg by mouth daily.    Marland Kitchen VITAMIN E PO Take 1 tablet by mouth daily.     No current facility-administered medications on file prior to visit.    Review of Systems  Constitutional: Negative for other unusual diaphoresis or sweats HENT: Negative for ear discharge or swelling Eyes: Negative for other worsening visual disturbances Respiratory: Negative for stridor or other swelling  Gastrointestinal: Negative for worsening distension or other blood Genitourinary: Negative for retention or other urinary change Musculoskeletal: Negative for other MSK pain or swelling Skin: Negative for color change or other new lesions Neurological: Negative for worsening tremors and other numbness  Psychiatric/Behavioral: Negative for worsening agitation or other fatigue All other system neg per pt    Objective:   Physical Exam BP 126/84   Pulse 79   Temp 98.5 F (36.9 C) (Oral)   Ht '5\' 6"'$  (1.676 m)   Wt (!) 318 lb (144.2 kg)   SpO2 96%   BMI 51.33 kg/m  VS noted,  Constitutional: Pt appears in NAD HENT: Head: NCAT.  Right Ear: External ear normal.  Left Ear: External ear normal.  Eyes: . Pupils are equal, round, and reactive to light. Conjunctivae and EOM are normal Nose: without d/c or deformity Neck: Neck supple. Gross normal ROM Cardiovascular: Normal rate and regular rhythm.   Pulmonary/Chest: Effort normal and breath sounds without rales or wheezing.  Neurological: Pt is alert. At baseline orientation, motor grossly intact Skin: Skin is warm. No rashes, other new lesions,  LLE edema trace to 1+ with area of red, tender, swelling to distal anterior let above the ankle Psychiatric: Pt behavior is normal without agitation  No other exam  findings  Lab Results  Component Value Date   WBC 7.7  09/15/2017   HGB 13.6 09/15/2017   HCT 40.0 09/15/2017   PLT 157 09/15/2017   GLUCOSE 107 (H) 09/15/2017   CHOL 92 (L) 05/08/2017   TRIG 102 05/08/2017   HDL 47 05/08/2017   LDLDIRECT 107.0 09/17/2016   LDLCALC 25 05/08/2017   ALT 16 05/08/2017   AST 21 05/08/2017   NA 142 09/15/2017   K 3.2 (L) 09/15/2017   CL 102 09/15/2017   CREATININE 0.80 09/15/2017   BUN 15 09/15/2017   CO2 28 05/28/2017   TSH 5.04 (H) 03/20/2017   HGBA1C 6.1 03/20/2017       Assessment & Plan:

## 2017-09-18 NOTE — Patient Instructions (Addendum)
Ok to finish the antibiotic as prescribed  OK to increase the Potassium to 3 pills per day  Please continue all other medications as before, and refills have been done if requested.  Please have the pharmacy call with any other refills you may need.  Please continue your efforts at being more active, low cholesterol diet, and weight control.  Please keep your appointments with your specialists as you may have planned  Please go to the LAB in the Basement (turn left off the elevator) for the tests to be done today  You will be contacted by phone if any changes need to be made immediately.  Otherwise, you will receive a letter about your results with an explanation, but please check with MyChart first.  Please return in 6 months, or sooner if needed, with Lab testing done 3-5 days before

## 2017-09-19 ENCOUNTER — Ambulatory Visit: Payer: Medicare Other | Admitting: Physical Therapy

## 2017-09-20 NOTE — Assessment & Plan Note (Signed)
Lab Results  Component Value Date   TSH 3.14 09/18/2017  stable overall by history and exam, recent data reviewed with pt, and pt to continue medical treatment as before,  to f/u any worsening symptoms or concerns

## 2017-09-20 NOTE — Assessment & Plan Note (Signed)
Ok to increase to 3 pills per day,  to f/u any worsening symptoms or concerns

## 2017-09-20 NOTE — Assessment & Plan Note (Signed)
stable overall by history and exam, recent data reviewed with pt, and pt to continue medical treatment as before,  to f/u any worsening symptoms or concerns Lab Results  Component Value Date   HGBA1C 6.3 09/18/2017   

## 2017-09-20 NOTE — Assessment & Plan Note (Signed)
Mild to mod, for antibx course,  to f/u any worsening symptoms or concerns 

## 2017-09-22 ENCOUNTER — Encounter: Payer: Self-pay | Admitting: Rehabilitation

## 2017-09-22 ENCOUNTER — Ambulatory Visit: Payer: Medicare Other | Admitting: Rehabilitation

## 2017-09-22 ENCOUNTER — Ambulatory Visit: Payer: Medicare Other | Admitting: Physical Therapy

## 2017-09-22 DIAGNOSIS — R2689 Other abnormalities of gait and mobility: Secondary | ICD-10-CM

## 2017-09-22 DIAGNOSIS — M542 Cervicalgia: Secondary | ICD-10-CM | POA: Diagnosis not present

## 2017-09-22 NOTE — Therapy (Signed)
[facility] [address] [place], [place], [address] Phone: [phone]   Fax:  [phone]  Physical Therapy Treatment and D/C Summary   Patient Details  Name: [name] MRN: [id number] Date of Birth: May 01, 1936 Referring Provider: Hulan Saas, DO   Encounter Date: 09/22/2017  PT End of Session - 09/22/17 1329    Visit Number  12    Number of Visits  12    Date for PT Re-Evaluation  09/22/17    PT Start Time  7425    PT Stop Time  1355    PT Time Calculation (min)  38 min    Activity Tolerance  Patient tolerated treatment well    Behavior During Therapy  [facility] for tasks assessed/performed       Past Medical History:  Diagnosis Date  . Allergy   . Anxiety   . Arthritis    no cartlidge in knees  . Asthma   . Bradycardia    a. atenolol stopped due to this, HR 40s.  . 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 CAD in native artery    a.  CAD s/p RCA stent 2000 with residual mild-mod disease of left system 2001.  Marland Kitchen Chronic atrial fibrillation ([place])   . COPD (chronic obstructive pulmonary disease) ([place])   . Essential hypertension 03/24/2015  . Gout    post operative  . Hx of radiation therapy 04/02/11 -05/20/11   left breast  . Hyperlipidemia   . Hypothyroidism 03/24/2015  . Incontinence of urine   . Malignant neoplasm of upper-outer quadrant of female breast ([place]) 12/26/2010  . Osteoporosis 03/24/2015  . Peripheral neuropathy 03/24/2015  . Plantar fasciitis   . Sleep apnea     Past Surgical History:  Procedure Laterality Date  . BREAST LUMPECTOMY W/ NEEDLE LOCALIZATION  01/29/2011   Left with [facility] Dr Margot Chimes  . CHOLECYSTECTOMY  1955  . CORONARY ANGIOPLASTY WITH STENT PLACEMENT  2000  . DILATION AND CURETTAGE OF UTERUS  1976   and 1996  . HAND SURGERY  1992   tendons between thumb and forefinger  . SKIN BIOPSY     1994, 2006, 2008, 2010, 2011, 2011, 2012-  pre-cancerous  .  TONSILLECTOMY  1955    There were no vitals filed for this visit.  Subjective Assessment - 09/22/17 1325    Subjective  Pt is doing well.  Note she has cellulitis in L leg, but is getting better.     Pertinent History  She has recently had knee injections, walker was hurting her shoulders, left wrist replacement.  Reviewed PMH in epic.  PRIOR FUNCTIONAL STATUS:  Uses walker in the house and w/c in the community b/c she didn't want to fall.  Walker helps with knee pain, but makes shoulders worse.     Patient Stated Goals  Do what the physician said.    Currently in Pain?  No/denies         [facility] PT Assessment - 09/22/17 1330      AROM   AROM Assessment Site  Cervical    Cervical Flexion  51    Cervical Extension  28    Cervical - Right Side Bend  27    Cervical - Left Side Bend  27    Cervical - Right Rotation  62    Cervical - Left Rotation  58           Therex:  Reviewed  final HEP, see pt instruction.                 PT Education - 09/22/17 1329    Education provided  Yes    Education Details  LTG results    Person(s) Educated  Patient    Methods  Explanation    Comprehension  Verbalized understanding       PT Short Term Goals - 08/25/17 1543      PT SHORT TERM GOAL #1   Title  The patient will return demo HEP for neck flexibility, general strength/conditioning.    Baseline  met per pt report (we have gone over exercises periodically in previous sessions).     Time  4    Period  Weeks    Status  Achieved      PT SHORT TERM GOAL #2   Title  The patient will improve neck extension AROM from 15 degrees to > or equal to 24 degrees     Baseline  26 deg of neck ext on 08/25/17    Time  4    Period  Weeks    Status  Achieved      PT SHORT TERM GOAL #3   Title  The patient will return demonstrate standing with RW with improved postural awareness performing shoulder depression and improved hand position to reduce pain with walker use.    Baseline  met  08/25/17    Time  4    Period  Weeks    Status  Achieved      PT SHORT TERM GOAL #4   Title  Further assess standing balance and LTG to follow.    Baseline  Pt feels that balance is at baseline    Time  4    Period  Weeks    Status  Deferred        PT Long Term Goals - 09/22/17 1329      PT LONG TERM GOAL #1   Title  The patient will verbalize understanding of community wellness for post d/c activities.    Baseline  does not plan to join gym, however is maintaining fitness and mobility at home.      Time  8    Period  Weeks    Status  Partially Met      PT LONG TERM GOAL #2   Title  The patient will improve neck AROM from 50 degrees bilateral rotation to > or equal to 58 degrees bilaterally.    Baseline  58 L rotation, 62 R rotation    Time  8    Period  Weeks    Status  Achieved      PT LONG TERM GOAL #3   Title  The patient will improve cervical sidebending from 10 degrees bilat to > or equal to 15 degrees.    Baseline  27 deg bilaterally     Time  8    Period  Weeks    Status  Achieved      PT LONG TERM GOAL #4   Title  The patient will have LTG for balance once assessed.    Baseline  Pt feels balance is at baseline    Time  8    Period  Weeks    Status  Deferred      PT LONG TERM GOAL #5   Title  pt to reduce L upper trap/ levator tightness to reduce pain to </= 2/10 (during movement) and promote cervical  mobility     Baseline  met 09/22/17    Status  Achieved            Plan - 09/22/17 1329    Clinical Impression Statement  Pt has met 3/5 LTGs, one deferred as pt did not wish to address balance and other LTG for community fitness only partially met due to pt only wanting to do activity at home, not wanting to join gym/fitness program.  Pt ready for D/C.     PT Next Visit Plan  D/C    Consulted and Agree with Plan of Care  Patient;Family member/caregiver    Family Member Consulted  spouse       Patient will benefit from skilled therapeutic  intervention in order to improve the following deficits and impairments:  Abnormal gait, Hypomobility, Pain, Decreased balance, Decreased mobility, Impaired flexibility  Visit Diagnosis: Neck pain  Other abnormalities of gait and mobility    PHYSICAL THERAPY DISCHARGE SUMMARY  Visits from Start of Care: 12  Current functional level related to goals / functional outcomes: See LTGs   Remaining deficits: Pt with continued ROM deficits and decreased flexibility, however has demo'd marked improvement, decreased pain and has HEP to address remaining deficits.     Education / Equipment: HEP  Plan: Patient agrees to discharge.  Patient goals were partially met. Patient is being discharged due to meeting the stated rehab goals.  ?????       Problem List Patient Active Problem List   Diagnosis Date Noted  . Left leg cellulitis 09/18/2017  . Whiplash 06/10/2017  . Cough 05/29/2017  . Concussion 05/29/2017  . Hammer toes of both feet 12/19/2016  . Allergic rhinitis 12/09/2016  . Pedal edema 09/17/2016  . Chest pain 07/18/2016  . Gross hematuria 04/26/2016  . Hyperglycemia 03/21/2016  . Dysuria 12/07/2015  . Fever blister 12/07/2015  . Anxiety and depression 06/30/2015  . Encounter for well adult exam with abnormal findings 06/30/2015  . Neck mass 03/24/2015  . Hypothyroidism 03/24/2015  . Gout 03/24/2015  . Essential hypertension 03/24/2015  . Asthma 03/24/2015  . Peripheral neuropathy 03/24/2015  . Osteoporosis 03/24/2015  . OSA (obstructive sleep apnea) 03/24/2015  . Morbid obesity ([place]) 03/24/2015  . Long term current use of anticoagulant therapy 03/16/2015  . Lymphadenopathy, cervical 03/16/2015  . Diarrhea 03/16/2015  . Hyperbilirubinemia 03/16/2015  . Coronary arteriosclerosis 09/22/2014  . Hyperlipidemia 07/23/2013  . Hypokalemia 02/18/2013  . Elevated bilirubin 02/18/2013  . Plantar fasciitis   . Arthritis   . Allergy   . Hx of radiation therapy   . Atrial  fibrillation ([place]) 01/21/2011  . Malignant neoplasm of upper-outer quadrant of left breast in female, estrogen receptor positive ([place]) 12/26/2010  . 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 [place], [place], [address] Phone: [phone]   Fax:  [phone] 09/22/17, 8:51 PM   Name: AUBREE DOODY MRN: [id number] Date of Birth: 22-Nov-1935

## 2017-09-22 NOTE — Patient Instructions (Signed)
NECK: Rotation    Do this sitting!  Make sure shoulders stay down! Rotate head to look over shoulder. _10__ reps per set, __2_ sets per day, _5-7__ days per week. Hold for 2-3 secs each direction.   Copyright  VHI. All rights reserved.  Axial Extension (Chin Tuck)    Do this sitting up, feet flat on floor and good posture.  Pull chin in and lengthen back of neck. Hold __5__ seconds while counting out loud. Repeat __10__ times. Do __2__ sessions per day.  http://gt2.exer.us/450  Copyright  VHI. All rights reserved.  External Rotation (Passive)    Lying on backwith BOTH elbows bent to 90 degrees. Gently rotate arms out to the side and hold for 5 seconds. Repeat 10 times.  BOTH ARMS WILL MOVE TOGETHER. Copyright  VHI. All rights reserved.   EXTENSION: Supine - Elbow Extended (Isometric)    Lie on back, right arm at side on surface. Press arm down into surface with elbow straight. Hold __5_ seconds. Complete __1_ sets of __10-15_ repetitions. Perform _1-2__ sessions per day.  CAN DO BOTH ARMS AT THE SAME TIME. Copyright  VHI. All rights reserved.     Seated thoracic Extension  - Find a seat with a low and rigid back. - Sit all the way back in your seat with you feet flat on the floor.  - Reach hands behind your head and extend thoracic spine over the back rest - * Be sure to "chin tuck" to avoid cervical extension  Hold for 5-10 secs. Do 5 reps, 2 times per day.      SNAG  Using a towel draped around your neck, begin with one hand stabilizing one end of towel with light downward pull. Next, turn your head as far to one direction as able (as in picture). Use opposite hand to gently pull towel across your face making sure not to pull across your jaw to allow for light stretch into rotational direction.You can do both directions, but mainly targeting right rotation. Hold for 2-3 secs and repeat x 10 reps. Do 1-2 times per day.

## 2017-09-25 ENCOUNTER — Ambulatory Visit: Payer: Medicare Other | Admitting: Physical Therapy

## 2017-09-26 ENCOUNTER — Ambulatory Visit (INDEPENDENT_AMBULATORY_CARE_PROVIDER_SITE_OTHER): Payer: Medicare Other | Admitting: Internal Medicine

## 2017-09-26 ENCOUNTER — Encounter: Payer: Self-pay | Admitting: Internal Medicine

## 2017-09-26 VITALS — BP 124/70 | HR 67 | Temp 98.6°F | Ht 66.0 in | Wt 372.1 lb

## 2017-09-26 DIAGNOSIS — L02416 Cutaneous abscess of left lower limb: Secondary | ICD-10-CM

## 2017-09-26 DIAGNOSIS — R739 Hyperglycemia, unspecified: Secondary | ICD-10-CM

## 2017-09-26 DIAGNOSIS — I10 Essential (primary) hypertension: Secondary | ICD-10-CM

## 2017-09-26 MED ORDER — DOXYCYCLINE HYCLATE 100 MG PO TABS: 100.0000 mg | ORAL_TABLET | Freq: Two times a day (BID) | ORAL | 0 refills | Status: DC

## 2017-09-26 MED ORDER — POTASSIUM CHLORIDE ER 10 MEQ PO TBCR: 30.0000 meq | EXTENDED_RELEASE_TABLET | Freq: Every day | ORAL | 3 refills | Status: DC

## 2017-09-26 NOTE — Assessment & Plan Note (Signed)
Early evolving on cleocin, for change to doxycycline given her allergies, and refer gen surgury, pt requests tues may 28 appt

## 2017-09-26 NOTE — Progress Notes (Signed)
Subjective:    Patient ID: [name], female    DOB: 11-08-1935, 82 y.o.   MRN: [id number]  HPI   Here to f/u after tx for LLE cellulitis with cleocin to which she has been compliant; had good initial response per pt with less pain, redness, swelling; did bump the leg about the site of the original injury site she believes involved a large wood splinter, but not expressed or observed since that time.  Now with 2 days worsening redness/swelling and raised area at the original site with tenderness but no drainage or foreign object seen.  Pt denies chest pain, increased sob or doe, wheezing, orthopnea, PND, increased LE swelling, palpitations, dizziness or syncope.   Pt denies polydipsia, polyuria Past Medical History:  Diagnosis Date  . Allergy   . Anxiety   . Arthritis    no cartlidge in knees  . Asthma   . Bradycardia    a. atenolol stopped due to this, HR 40s.  . 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 CAD in native artery    a.  CAD s/p RCA stent 2000 with residual mild-mod disease of left system 2001.  Marland Kitchen Chronic atrial fibrillation ([place])   . COPD (chronic obstructive pulmonary disease) ([place])   . Essential hypertension 03/24/2015  . Gout    post operative  . Hx of radiation therapy 04/02/11 -05/20/11   left breast  . Hyperlipidemia   . Hypothyroidism 03/24/2015  . Incontinence of urine   . Malignant neoplasm of upper-outer quadrant of female breast ([place]) 12/26/2010  . Osteoporosis 03/24/2015  . Peripheral neuropathy 03/24/2015  . Plantar fasciitis   . Sleep apnea    Past Surgical History:  Procedure Laterality Date  . BREAST LUMPECTOMY W/ NEEDLE LOCALIZATION  01/29/2011   Left with [facility] Dr Margot Chimes  . CHOLECYSTECTOMY  1955  . CORONARY ANGIOPLASTY WITH STENT PLACEMENT  2000  . DILATION AND CURETTAGE OF UTERUS  1976   and 1996  . HAND SURGERY  1992   tendons between thumb and forefinger  . SKIN BIOPSY     1994, 2006, 2008, 2010, 2011, 2011, 2012-   pre-cancerous  . TONSILLECTOMY  1955    reports that she quit smoking about 54 years ago. She has a 4.00 pack-year smoking history. She has never used smokeless tobacco. She reports that she does not drink alcohol or use drugs. family history includes Asthma in her sister; Cancer in her father; Heart attack in her brother and father; Heart disease in her brother and father. Allergies  Allergen Reactions  . Crestor [Rosuvastatin Calcium]     Severe liver function problems  . Hydrocodone-Acetaminophen Anxiety and Itching  . Lidocaine Hcl Other (See Comments)    Novacaine:  Becomes very shaky  . Lipitor [Atorvastatin Calcium] Other (See Comments)    Elevated liver function   . Procaine Other (See Comments)    shaking  . Rosuvastatin Other (See Comments)    Severe liver function problems  . Theophylline Other (See Comments)    Becomes very shaky skaking  . Vicodin [Hydrocodone-Acetaminophen] Itching    Itching all over  . Codeine Nausea Only, Anxiety and Other (See Comments)    Also complained of dizziness.  Has same problems with oxycodone and hydrocodone Visual Disturbance, Balance Difficulty, Dizzy "Room Spinning; "Swimming" Balance, vision, nausea, dizzy, "swimming", "room spinning"  . Oxycodone-Acetaminophen Nausea Only, Anxiety and Other (See Comments)    Visual Disturbance, Balance Difficulty, Dizzy "  Room Spinning; "Swimming"  . Propoxyphene Anxiety, Nausea Only and Other (See Comments)    Visual Disturbance, Balance Difficulty, Dizzy "Room Spinning; "Swimming"  . Quinine Derivatives Nausea Only    dizzy  . Sulfa Antibiotics Nausea Only    Also complained of dizziness  . Atorvastatin Other (See Comments)    Severe liver function problems  . Ephedrine Other (See Comments)    shaking  . Cephalexin Rash  . Epinephrine Other (See Comments)    Patient becomes "shaky" shaking  . Penicillins Rash    Pt reported all over body rash in 1958 Has patient had a PCN reaction causing  immediate rash, facial/tongue/throat swelling, SOB or lightheadedness with hypotension:Yes Has patient had a PCN reaction causing severe rash involving mucus membranes or skin necrosis: No Has patient had a PCN reaction that required hospitalization: No Has patient had a PCN reaction occurring within the last 10 years:No    . Theophyllines Other (See Comments)    Patient becomes "shaky"   Current Outpatient Medications on File Prior to Visit  Medication Sig Dispense Refill  . albuterol (PROVENTIL) (2.5 MG/3ML) 0.083% nebulizer solution Take 3 mLs (2.5 mg total) by nebulization every 6 (six) hours as needed for wheezing or shortness of breath. 75 mL 12  . allopurinol (ZYLOPRIM) 300 MG tablet Take 300 mg by mouth daily.  1  . ALPRAZolam (XANAX) 0.5 MG tablet TAKE 1 TO 2 TABLETS BY MOUTH TWICE DAILY IF NEEDED FOR ANXIETY/SLEEP 60 tablet 3  . azelastine (ASTELIN) 0.1 % nasal spray Place 2 sprays into both nostrils daily. Use in each nostril as directed    . azelastine (OPTIVAR) 0.05 % ophthalmic solution Place 1 drop into both eyes 2 (two) times daily. (Patient taking differently: Place 1 drop into both eyes 2 (two) times daily as needed (allergies and dry eyes). ) 6 mL 12  . B Complex Vitamins (VITAMIN B COMPLEX PO) Take 1 tablet by mouth daily.    . Biotin 5000 MCG CAPS Take 5,000 mcg by mouth daily.     . budesonide-formoterol (SYMBICORT) 160-4.5 MCG/ACT inhaler Inhale 2 puffs 2 (two) times daily into the lungs.    Marland Kitchen buPROPion (WELLBUTRIN XL) 150 MG 24 hr tablet Take 1 tablet (150 mg total) by mouth daily. 90 tablet 3  . cholecalciferol (VITAMIN D) 1000 UNITS tablet Take 1,000 Units by mouth daily.    Marland Kitchen co-enzyme Q-10 50 MG capsule Take 400 mg by mouth daily.    . colchicine (COLCRYS) 0.6 MG tablet Take 0.6 mg by mouth daily as needed (flare  up).     . Cyanocobalamin (VITAMIN B-12 PO) Take 1 tablet by mouth daily.    Marland Kitchen escitalopram (LEXAPRO) 10 MG tablet TAKE 1 TABLET BY MOUTH ONCE DAILY 90  tablet 2  . Evolocumab with Infusor ([place]) 420 MG/3.5ML SOCT Inject 1 Units into the skin every 30 (thirty) days.    Marland Kitchen ezetimibe (ZETIA) 10 MG tablet Take 1/2 to 1 tablet by mouth daily as directed 30 tablet 11  . gabapentin (NEURONTIN) 100 MG capsule Take 1 capsule (100 mg total) daily by mouth. (Patient taking differently: Take 100 mg by mouth daily as needed (foot pain). ) 90 capsule 3  . hydrochlorothiazide (HYDRODIURIL) 25 MG tablet take 2 tablets by mouth once daily 180 tablet 3  . levocetirizine (XYZAL) 5 MG tablet Take 1 tablet (5 mg total) by mouth at bedtime. 30 tablet 5  . levothyroxine (SYNTHROID, LEVOTHROID) 75 MCG tablet Take 1  tablet (75 mcg total) daily by mouth. 90 tablet 3  . metoprolol succinate (TOPROL XL) 25 MG 24 hr tablet Take 0.5 tablets (12.5 mg total) by mouth daily. 45 tablet 1  . nitrofurantoin, macrocrystal-monohydrate, (MACROBID) 100 MG capsule Take 1 capsule (100 mg total) by mouth 2 (two) times daily. 20 capsule 0  . nystatin cream (MYCOSTATIN) Apply 1 application topically daily as needed for dry skin.   0  . PROAIR HFA 108 (90 Base) MCG/ACT inhaler INHALE 2 PUFFS BY MOUTH INTO THE LUNGS AS NEEDED FOR WHEEZING/SHORTNESS OF BREATH 17 g 3  . Pyridoxine HCl (VITAMIN B-6 PO) Take 1 tablet by mouth daily.    Marland Kitchen RA ALLERGY RELIEF 180 MG tablet take 1 tablet by mouth once daily 30 tablet 5  . rivaroxaban (XARELTO) 20 MG TABS tablet Take 1 tablet (20 mg total) by mouth daily. 90 tablet 1  . rosuvastatin (CRESTOR) 10 MG tablet Take 1 tablet by mouth twice a week. (Patient taking differently: Take 1 tablet by mouth twice a week (Mon & Fri)) 27 tablet 3  . traMADol-acetaminophen (ULTRACET) 37.5-325 MG tablet Take 1-2 tablets by mouth every 6 (six) hours as needed for moderate pain or severe pain.   0  . triamcinolone (NASACORT AQ) 55 MCG/ACT AERO nasal inhaler Place 2 sprays daily into the nose. 1 Inhaler 12  . vitamin C (ASCORBIC ACID) 500 MG tablet Take  500 mg by mouth daily.    Marland Kitchen VITAMIN E PO Take 1 tablet by mouth daily.     No current facility-administered medications on file prior to visit.    Review of Systems  Constitutional: Negative for other unusual diaphoresis or sweats HENT: Negative for ear discharge or swelling Eyes: Negative for other worsening visual disturbances Respiratory: Negative for stridor or other swelling  Gastrointestinal: Negative for worsening distension or other blood Genitourinary: Negative for retention or other urinary change Musculoskeletal: Negative for other MSK pain or swelling Skin: Negative for color change or other new lesions Neurological: Negative for worsening tremors and other numbness  Psychiatric/Behavioral: Negative for worsening agitation or other fatigue All other system neg per pt    Objective:   Physical Exam BP 124/70 (BP Location: Left Arm, Patient Position: Sitting, Cuff Size: Large)   Pulse 67   Temp 98.6 F (37 C) (Oral)   Ht _0  (1.676 m)   Wt (!) 372 lb 2 oz (168.8 kg)   SpO2 94%   BMI 60.06 kg/m  VS noted, non toxic Constitutional: Pt appears in NAD HENT: Head: NCAT.  Right Ear: External ear normal.  Left Ear: External ear normal.  Eyes: . Pupils are equal, round, and reactive to light. Conjunctivae and EOM are normal Nose: without d/c or deformity Neck: Neck supple. Gross normal ROM Cardiovascular: Normal rate and regular rhythm.   Pulmonary/Chest: Effort normal and breath sounds without rales or wheezing.  Neurological: Pt is alert. At baseline orientation, motor grossly intact Skin: Skin is warm. No rashes, LLE with 1 cm area raised, tender, swelling fluctuant about 4 cm distal to patella with 1 cm lateral erythema with induration, no drainage or ulcer, has trace associated LLE edema Psychiatric: Pt behavior is normal without agitation  No other exam findings    Assessment & Plan:

## 2017-09-26 NOTE — Assessment & Plan Note (Signed)
stable overall by history and exam, recent data reviewed with pt, and pt to continue medical treatment as before,  to f/u any worsening symptoms or concerns BP Readings from Last 3 Encounters:  09/26/17 124/70  09/18/17 126/84  09/15/17 (!) 159/84

## 2017-09-26 NOTE — Patient Instructions (Addendum)
Please take all new medication as prescribed- the antibiotic  You will be contacted regarding the referral for: General [facility]urg[facility] - to see [facility] now please  Please continue all other medications as before, and refills have been done if requested.  Please have the pharmacy call with any other refills you may need.  Please keep your appointments with your specialists as you may have planned

## 2017-09-26 NOTE — Assessment & Plan Note (Signed)
stable overall by history and exam, recent data reviewed with pt, and pt to continue medical treatment as before,  to f/u any worsening symptoms or concerns Lab Results  Component Value Date   HGBA1C 6.3 09/18/2017   

## 2017-10-01 DIAGNOSIS — L02419 Cutaneous abscess of limb, unspecified: Secondary | ICD-10-CM | POA: Diagnosis not present

## 2017-10-01 DIAGNOSIS — L02416 Cutaneous abscess of left lower limb: Secondary | ICD-10-CM | POA: Diagnosis not present

## 2017-10-21 ENCOUNTER — Other Ambulatory Visit: Payer: Self-pay | Admitting: Physician Assistant

## 2017-10-21 MED ORDER — METOPROLOL SUCCINATE ER 25 MG PO TB24: 12.5000 mg | ORAL_TABLET | Freq: Every day | ORAL | 1 refills | Status: DC

## 2017-10-21 NOTE — Telephone Encounter (Signed)
Pt's medication was sent to pt's pharmacy as requested. Confirmation received.  °

## 2017-10-22 ENCOUNTER — Other Ambulatory Visit: Payer: Self-pay | Admitting: Internal Medicine

## 2017-10-22 MED ORDER — ALPRAZOLAM 0.5 MG PO TABS: ORAL_TABLET | ORAL | 3 refills | Status: DC

## 2017-11-05 ENCOUNTER — Other Ambulatory Visit: Payer: Self-pay | Admitting: *Deleted

## 2017-11-05 ENCOUNTER — Telehealth: Payer: Self-pay | Admitting: Internal Medicine

## 2017-11-05 MED ORDER — LEVOCETIRIZINE DIHYDROCHLORIDE 5 MG PO TABS: 5.0000 mg | ORAL_TABLET | Freq: Every day | ORAL | 2 refills | Status: DC

## 2017-11-05 MED ORDER — ROSUVASTATIN CALCIUM 10 MG PO TABS: ORAL_TABLET | ORAL | 0 refills | Status: DC

## 2017-11-05 MED ORDER — ESCITALOPRAM OXALATE 10 MG PO TABS: 10.0000 mg | ORAL_TABLET | Freq: Every day | ORAL | 2 refills | Status: DC

## 2017-11-05 MED ORDER — HYDROCHLOROTHIAZIDE 25 MG PO TABS: 50.0000 mg | ORAL_TABLET | Freq: Every day | ORAL | 2 refills | Status: DC

## 2017-11-05 MED ORDER — LEVOTHYROXINE SODIUM 75 MCG PO TABS: 75.0000 ug | ORAL_TABLET | Freq: Every day | ORAL | 2 refills | Status: DC

## 2017-11-05 MED ORDER — POTASSIUM CHLORIDE ER 10 MEQ PO TBCR
30.0000 meq | EXTENDED_RELEASE_TABLET | Freq: Every day | ORAL | 2 refills | Status: DC
Start: 2017-11-05 — End: 2018-11-09

## 2017-11-05 MED ORDER — RIVAROXABAN 20 MG PO TABS: 20.0000 mg | ORAL_TABLET | Freq: Every day | ORAL | 1 refills | Status: DC

## 2017-11-05 MED ORDER — EZETIMIBE 10 MG PO TABS: ORAL_TABLET | ORAL | 0 refills | Status: DC

## 2017-11-05 NOTE — Telephone Encounter (Signed)
See attached note from [facility] needing refill authorizations for 6 meds.   Thanks.    Dr. Jenny Reichmann is PCP.

## 2017-11-05 NOTE — Telephone Encounter (Signed)
Copied from [place] [phone]. Topic: Quick Communication - See Telephone Encounter >> Nov 05, 2017  9:29 AM Rutherford Nail, NT wrote: CRM for notification. See Telephone encounter for: 11/05/17. Ryan with [facility] calling to get refill authorization for the following medications.   montelukast (SINGULAIR) 10 MG tablet potassium chloride (K-DUR) 10 MEQ tablet levocetirizine (XYZAL) 5 MG tablet levothyroxine (SYNTHROID, LEVOTHROID) 75 MCG tablet hydrochlorothiazide (HYDRODIURIL) 25 MG tablet escitalopram (LEXAPRO) 10 MG tablet   [facility] - [place] [place] [phone] Reference#: [id number]

## 2017-11-05 NOTE — Telephone Encounter (Signed)
Reviewed chart pt is up-to-date sent refills to mail service except for Singulair. Med has been d/c.Marland KitchenJohny Chess

## 2017-11-05 NOTE — Telephone Encounter (Signed)
Pt's pharmacy is requesting a refill on Xarelto. Please address

## 2017-11-14 ENCOUNTER — Ambulatory Visit (INDEPENDENT_AMBULATORY_CARE_PROVIDER_SITE_OTHER): Payer: Medicare Other | Admitting: Internal Medicine

## 2017-11-14 ENCOUNTER — Encounter: Payer: Self-pay | Admitting: Internal Medicine

## 2017-11-14 VITALS — BP 128/70 | HR 77 | Ht 65.75 in | Wt 306.0 lb

## 2017-11-14 DIAGNOSIS — L02416 Cutaneous abscess of left lower limb: Secondary | ICD-10-CM

## 2017-11-14 DIAGNOSIS — I10 Essential (primary) hypertension: Secondary | ICD-10-CM

## 2017-11-14 DIAGNOSIS — E039 Hypothyroidism, unspecified: Secondary | ICD-10-CM

## 2017-11-14 NOTE — Patient Instructions (Signed)
Please continue all other medications as before, and refills have been done if requested.  Please have the pharmacy call with any other refills you may need.  Please continue your efforts at being more active, low cholesterol diet, and weight control.  You are otherwise up to date with prevention measures today.  Please keep your appointments with your specialists as you may have planned   

## 2017-11-14 NOTE — Assessment & Plan Note (Signed)
Resolved, reassured, no further tx needed 

## 2017-11-14 NOTE — Assessment & Plan Note (Signed)
stable overall by history and exam, recent data reviewed with pt, and pt to continue medical treatment as before,  to f/u any worsening symptoms or concerns BP Readings from Last 3 Encounters:  11/14/17 128/70  09/26/17 124/70  09/18/17 126/84

## 2017-11-14 NOTE — Progress Notes (Signed)
Subjective:    Patient ID: [name], female    DOB: 1936-03-14, 82 y.o.   MRN: [id number]  HPI    Here to f/u left leg, doing well with healed left pretibial after I and D, nausea better but just not feeeling good, wonders If needs more antibiotic.   Pt denies fever, wt loss, night sweats, loss of appetite, or other constitutional symptoms.  Pt denies chest pain, increased sob or doe, wheezing, orthopnea, PND, increased LE swelling, palpitations, dizziness or syncope.  Pt denies new neurological symptoms such as new headache, or facial or extremity weakness or numbness   Pt denies polydipsia, polyuria.  Does c/o ongoing fatigue, but denies signficant daytime hypersomnolence. Did have fall in jan 2019, hit head with concussion.  Saw Dr Smith/sports medicine with referral to PT.  Denies urinary symptoms such as dysuria, frequency, urgency, flank pain, hematuria or n/v, fever, chills. Past Medical History:  Diagnosis Date  . Allergy   . Anxiety   . Arthritis    no cartlidge in knees  . Asthma   . Bradycardia    a. atenolol stopped due to this, HR 40s.  . 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 CAD in native artery    a.  CAD s/p RCA stent 2000 with residual mild-mod disease of left system 2001.  Marland Kitchen Chronic atrial fibrillation ([place])   . COPD (chronic obstructive pulmonary disease) ([place])   . Essential hypertension 03/24/2015  . Gout    post operative  . Hx of radiation therapy 04/02/11 -05/20/11   left breast  . Hyperlipidemia   . Hypothyroidism 03/24/2015  . Incontinence of urine   . Malignant neoplasm of upper-outer quadrant of female breast ([place]) 12/26/2010  . Osteoporosis 03/24/2015  . Peripheral neuropathy 03/24/2015  . Plantar fasciitis   . Sleep apnea    Past Surgical History:  Procedure Laterality Date  . BREAST LUMPECTOMY W/ NEEDLE LOCALIZATION  01/29/2011   Left with [facility] Dr Margot Chimes  . CHOLECYSTECTOMY  1955  . CORONARY ANGIOPLASTY WITH STENT PLACEMENT   2000  . DILATION AND CURETTAGE OF UTERUS  1976   and 1996  . HAND SURGERY  1992   tendons between thumb and forefinger  . SKIN BIOPSY     1994, 2006, 2008, 2010, 2011, 2011, 2012-  pre-cancerous  . TONSILLECTOMY  1955    reports that she quit smoking about 54 years ago. She has a 4.00 pack-year smoking history. She has never used smokeless tobacco. She reports that she does not drink alcohol or use drugs. family history includes Asthma in her sister; Cancer in her father; Heart attack in her brother and father; Heart disease in her brother and father. Allergies  Allergen Reactions  . Crestor [Rosuvastatin Calcium]     Severe liver function problems  . Hydrocodone-Acetaminophen Anxiety and Itching  . Lidocaine Hcl Other (See Comments)    Novacaine:  Becomes very shaky  . Lipitor [Atorvastatin Calcium] Other (See Comments)    Elevated liver function   . Procaine Other (See Comments)    shaking  . Rosuvastatin Other (See Comments)    Severe liver function problems  . Theophylline Other (See Comments)    Becomes very shaky skaking  . Vicodin [Hydrocodone-Acetaminophen] Itching    Itching all over  . Codeine Nausea Only, Anxiety and Other (See Comments)    Also complained of dizziness.  Has same problems with oxycodone and hydrocodone Visual Disturbance, Balance Difficulty,  Dizzy "Room Spinning; "Swimming" Balance, vision, nausea, dizzy, "swimming", "room spinning"  . Oxycodone-Acetaminophen Nausea Only, Anxiety and Other (See Comments)    Visual Disturbance, Balance Difficulty, Dizzy "Room Spinning; "Swimming"  . Propoxyphene Anxiety, Nausea Only and Other (See Comments)    Visual Disturbance, Balance Difficulty, Dizzy "Room Spinning; "Swimming"  . Quinine Derivatives Nausea Only    dizzy  . Sulfa Antibiotics Nausea Only    Also complained of dizziness  . Atorvastatin Other (See Comments)    Severe liver function problems  . Ephedrine Other (See Comments)    shaking  .  Cephalexin Rash  . Epinephrine Other (See Comments)    Patient becomes "shaky" shaking  . Penicillins Rash    Pt reported all over body rash in 1958 Has patient had a PCN reaction causing immediate rash, facial/tongue/throat swelling, SOB or lightheadedness with hypotension:Yes Has patient had a PCN reaction causing severe rash involving mucus membranes or skin necrosis: No Has patient had a PCN reaction that required hospitalization: No Has patient had a PCN reaction occurring within the last 10 years:No    . Theophyllines Other (See Comments)    Patient becomes "shaky"   Current Outpatient Medications on File Prior to Visit  Medication Sig Dispense Refill  . albuterol (PROVENTIL) (2.5 MG/3ML) 0.083% nebulizer solution Take 3 mLs (2.5 mg total) by nebulization every 6 (six) hours as needed for wheezing or shortness of breath. 75 mL 12  . allopurinol (ZYLOPRIM) 300 MG tablet Take 300 mg by mouth daily.  1  . ALPRAZolam (XANAX) 0.5 MG tablet TAKE 1 TO 2 TABLETS BY MOUTH TWICE DAILY IF NEEDED FOR ANXIETY/SLEEP 60 tablet 3  . azelastine (ASTELIN) 0.1 % nasal spray Place 2 sprays into both nostrils daily. Use in each nostril as directed    . azelastine (OPTIVAR) 0.05 % ophthalmic solution Place 1 drop into both eyes 2 (two) times daily. (Patient taking differently: Place 1 drop into both eyes 2 (two) times daily as needed (allergies and dry eyes). ) 6 mL 12  . B Complex Vitamins (VITAMIN B COMPLEX PO) Take 1 tablet by mouth daily.    . Biotin 5000 MCG CAPS Take 5,000 mcg by mouth daily.     . budesonide-formoterol (SYMBICORT) 160-4.5 MCG/ACT inhaler Inhale 2 puffs 2 (two) times daily into the lungs.    Marland Kitchen buPROPion (WELLBUTRIN XL) 150 MG 24 hr tablet Take 1 tablet (150 mg total) by mouth daily. 90 tablet 3  . cholecalciferol (VITAMIN D) 1000 UNITS tablet Take 1,000 Units by mouth daily.    Marland Kitchen co-enzyme Q-10 50 MG capsule Take 400 mg by mouth daily.    . colchicine (COLCRYS) 0.6 MG tablet Take  0.6 mg by mouth daily as needed (flare  up).     . Cyanocobalamin (VITAMIN B-12 PO) Take 1 tablet by mouth daily.    Marland Kitchen escitalopram (LEXAPRO) 10 MG tablet Take 1 tablet (10 mg total) by mouth daily. 90 tablet 2  . Evolocumab with Infusor ([place]) 420 MG/3.5ML SOCT Inject 1 Units into the skin every 30 (thirty) days.    Marland Kitchen ezetimibe (ZETIA) 10 MG tablet Take 1/2 to 1 tablet by mouth daily as directed 90 tablet 0  . gabapentin (NEURONTIN) 100 MG capsule Take 1 capsule (100 mg total) daily by mouth. (Patient taking differently: Take 100 mg by mouth daily as needed (foot pain). ) 90 capsule 3  . hydrochlorothiazide (HYDRODIURIL) 25 MG tablet Take 2 tablets (50 mg total) by mouth daily.  180 tablet 2  . levocetirizine (XYZAL) 5 MG tablet Take 1 tablet (5 mg total) by mouth at bedtime. 90 tablet 2  . levothyroxine (SYNTHROID, LEVOTHROID) 75 MCG tablet Take 1 tablet (75 mcg total) by mouth daily. 90 tablet 2  . metoprolol succinate (TOPROL-XL) 25 MG 24 hr tablet Take 0.5 tablets (12.5 mg total) by mouth daily. Please make an appt with Dr. Irish Lack for December for future refills. 1st attempt. 45 tablet 1  . nystatin cream (MYCOSTATIN) Apply 1 application topically daily as needed for dry skin.   0  . potassium chloride (K-DUR) 10 MEQ tablet Take 3 tablets (30 mEq total) by mouth daily. 270 tablet 2  . PROAIR HFA 108 (90 Base) MCG/ACT inhaler INHALE 2 PUFFS BY MOUTH INTO THE LUNGS AS NEEDED FOR WHEEZING/SHORTNESS OF BREATH 17 g 3  . Pyridoxine HCl (VITAMIN B-6 PO) Take 1 tablet by mouth daily.    Marland Kitchen RA ALLERGY RELIEF 180 MG tablet take 1 tablet by mouth once daily 30 tablet 5  . rivaroxaban (XARELTO) 20 MG TABS tablet Take 1 tablet (20 mg total) by mouth daily with supper. 90 tablet 1  . rosuvastatin (CRESTOR) 10 MG tablet Take 1 tablet by mouth twice a week. 27 tablet 0  . Saccharomyces boulardii (FLORASTOR PO) Take 2 capsules by mouth daily.    . traMADol-acetaminophen (ULTRACET) 37.5-325  MG tablet Take 1-2 tablets by mouth every 6 (six) hours as needed for moderate pain or severe pain.   0  . triamcinolone (NASACORT AQ) 55 MCG/ACT AERO nasal inhaler Place 2 sprays daily into the nose. 1 Inhaler 12  . vitamin C (ASCORBIC ACID) 500 MG tablet Take 500 mg by mouth daily.    Marland Kitchen VITAMIN E PO Take 1 tablet by mouth daily.     No current facility-administered medications on file prior to visit.    Review of Systems Constitutional: Negative for other unusual diaphoresis, sweats, appetite or weight changes HENT: Negative for other worsening hearing loss, ear pain, facial swelling, mouth sores or neck stiffness.   Eyes: Negative for other worsening pain, redness or other visual disturbance.  Respiratory: Negative for other stridor or swelling Cardiovascular: Negative for other palpitations or other chest pain  Gastrointestinal: Negative for worsening diarrhea or loose stools, blood in stool, distention or other pain Genitourinary: Negative for hematuria, flank pain or other change in urine volume.  Musculoskeletal: Negative for myalgias or other joint swelling.  Skin: Negative for other color change, or other wound or worsening drainage.  Neurological: Negative for other syncope or numbness. Hematological: Negative for other adenopathy or swelling Psychiatric/Behavioral: Negative for hallucinations, other worsening agitation, SI, self-injury, or new decreased concentration All other system neg per pt    Objective:   Physical Exam BP 128/70 (BP Location: Right Arm, Patient Position: Sitting, Cuff Size: Large)   Pulse 77   Ht 5' 5.75" (1.67 m)   Wt (!) 306 lb (138.8 kg)   SpO2 95%   BMI 49.77 kg/m  VS noted, not ill appearing Constitutional: Pt appears in NAD HENT: Head: NCAT.  Right Ear: External ear normal.  Left Ear: External ear normal.  Eyes: . Pupils are equal, round, and reactive to light. Conjunctivae and EOM are normal Nose: without d/c or deformity Neck: Neck  supple. Gross normal ROM Cardiovascular: Normal rate and regular rhythm.   Pulmonary/Chest: Effort normal and breath sounds without rales or wheezing.  Abd:  Soft, NT, ND, + BS, no organomegaly Neurological: Pt is alert.  At baseline orientation, motor grossly intact Skin: Skin is warm. No rashes, other new lesions, no LE edema Psychiatric: Pt behavior is normal without agitation  No other exam findings Lab Results  Component Value Date   WBC 7.0 09/18/2017   HGB 13.5 09/18/2017   HCT 40.6 09/18/2017   PLT 175.0 09/18/2017   GLUCOSE 107 (H) 09/15/2017   CHOL 152 09/18/2017   TRIG 174.0 (H) 09/18/2017   HDL 42.00 09/18/2017   LDLDIRECT 107.0 09/17/2016   LDLCALC 75 09/18/2017   ALT 12 09/18/2017   AST 19 09/18/2017   NA 142 09/15/2017   K 3.2 (L) 09/15/2017   CL 102 09/15/2017   CREATININE 0.80 09/15/2017   BUN 15 09/15/2017   CO2 28 05/28/2017   TSH 3.14 09/18/2017   HGBA1C 6.3 09/18/2017      Assessment & Plan:

## 2017-11-14 NOTE — Assessment & Plan Note (Signed)
stable overall by history and exam, recent data reviewed with pt, and pt to continue medical treatment as before,  to f/u any worsening symptoms or concerns Lab Results  Component Value Date   TSH 3.14 09/18/2017

## 2017-11-26 DIAGNOSIS — J301 Allergic rhinitis due to pollen: Secondary | ICD-10-CM | POA: Diagnosis not present

## 2017-11-26 DIAGNOSIS — J3089 Other allergic rhinitis: Secondary | ICD-10-CM | POA: Diagnosis not present

## 2017-11-26 DIAGNOSIS — T63461A Toxic effect of venom of wasps, accidental (unintentional), initial encounter: Secondary | ICD-10-CM | POA: Diagnosis not present

## 2017-11-26 DIAGNOSIS — J4540 Moderate persistent asthma, uncomplicated: Secondary | ICD-10-CM | POA: Diagnosis not present

## 2017-12-05 ENCOUNTER — Telehealth: Payer: Self-pay | Admitting: Interventional Cardiology

## 2017-12-05 NOTE — Telephone Encounter (Signed)
New Message:      Pt states she always has a cholesterol test done before she comes to see the dr and wanted to know how far in advance does she need to have this test done.

## 2017-12-05 NOTE — Telephone Encounter (Signed)
Called patient back. Informed patient that she just had her lipids check and they were good. Informed her that her PCP might check them again in a year or every 6 months. Encouraged patient to keep her appointment with Dr. Irish Lack is October, and if additional lab work is needed at that time he will let her know. Patient verbalized understanding and thanked me for the call.

## 2017-12-08 ENCOUNTER — Other Ambulatory Visit: Payer: Self-pay | Admitting: Internal Medicine

## 2017-12-11 DIAGNOSIS — Z79899 Other long term (current) drug therapy: Secondary | ICD-10-CM | POA: Diagnosis not present

## 2017-12-11 DIAGNOSIS — M1009 Idiopathic gout, multiple sites: Secondary | ICD-10-CM | POA: Diagnosis not present

## 2017-12-11 DIAGNOSIS — M170 Bilateral primary osteoarthritis of knee: Secondary | ICD-10-CM | POA: Diagnosis not present

## 2017-12-13 ENCOUNTER — Ambulatory Visit (INDEPENDENT_AMBULATORY_CARE_PROVIDER_SITE_OTHER): Payer: Medicare Other | Admitting: Family Medicine

## 2017-12-13 ENCOUNTER — Encounter: Payer: Self-pay | Admitting: Family Medicine

## 2017-12-13 VITALS — BP 120/80 | HR 67 | Temp 98.6°F | Resp 14 | Ht 66.0 in | Wt 306.0 lb

## 2017-12-13 DIAGNOSIS — L03116 Cellulitis of left lower limb: Secondary | ICD-10-CM

## 2017-12-13 MED ORDER — DOXYCYCLINE HYCLATE 100 MG PO TABS: 100.0000 mg | ORAL_TABLET | Freq: Two times a day (BID) | ORAL | 0 refills | Status: DC

## 2017-12-13 MED ORDER — TRIAMCINOLONE ACETONIDE 0.1 % EX CREA: 1.0000 "application " | TOPICAL_CREAM | Freq: Two times a day (BID) | CUTANEOUS | 0 refills | Status: DC

## 2017-12-13 NOTE — Patient Instructions (Addendum)
Please have your leg rechecked in the next week with Dr. Judi Cong.  Complete the antibiotics. Elevate your leg. Use the steroid cream twice a day.

## 2017-12-13 NOTE — Progress Notes (Signed)
Subjective  CC:  Chief Complaint  Patient presents with  . Rash    on left leg; redness    Evaluated today at the [facility].  Chart reviewed. HPI: [name] is a 82 y.o. female who presents to the office today to address the problems listed above in the chief complaint.  Had surgery, then left lower ext abscess/cellulitis, last checked in mid July - had resolved at that time. Pt reports has had some persistent redness but yesterday noted increased redness and warmth with associated swelling. No calf pain. No drainage from old abscess site. No f/c/s. Denies chronic venous insufficiency.   Assessment  1. Left leg cellulitis      Plan   Possible cellulitis, mild: given history, will cover with doxy. Add triamcinolone cream bid and leg elevation. F/u with pcp in 2-5 days for recheck      No orders of the defined types were placed in this encounter.  Meds ordered this encounter  Medications  . doxycycline (VIBRA-TABS) 100 MG tablet    Sig: Take 1 tablet (100 mg total) by mouth 2 (two) times daily for 7 days.    Dispense:  14 tablet    Refill:  0  . triamcinolone cream (KENALOG) 0.1 %    Sig: Apply 1 application topically 2 (two) times daily. For 2 weeks, then as needed    Dispense:  45 g    Refill:  0      I reviewed the patients updated PMH, FH, and SocHx.    Patient Active Problem List   Diagnosis Date Noted  . Abscess of left leg 09/26/2017  . Left leg cellulitis 09/18/2017  . Whiplash 06/10/2017  . Cough 05/29/2017  . Concussion 05/29/2017  . Hammer toes of both feet 12/19/2016  . Allergic rhinitis 12/09/2016  . Pedal edema 09/17/2016  . Chest pain 07/18/2016  . Gross hematuria 04/26/2016  . Hyperglycemia 03/21/2016  . Dysuria 12/07/2015  . Fever blister 12/07/2015  . Anxiety and depression 06/30/2015  . Encounter for well adult exam with abnormal findings 06/30/2015  . Neck mass 03/24/2015  . Hypothyroidism 03/24/2015  . Gout 03/24/2015   . Essential hypertension 03/24/2015  . Asthma 03/24/2015  . Peripheral neuropathy 03/24/2015  . Osteoporosis 03/24/2015  . OSA (obstructive sleep apnea) 03/24/2015  . Morbid obesity ([place]) 03/24/2015  . Long term current use of anticoagulant therapy 03/16/2015  . Lymphadenopathy, cervical 03/16/2015  . Diarrhea 03/16/2015  . Hyperbilirubinemia 03/16/2015  . Coronary arteriosclerosis 09/22/2014  . Hyperlipidemia 07/23/2013  . Hypokalemia 02/18/2013  . Elevated bilirubin 02/18/2013  . Plantar fasciitis   . Arthritis   . Allergy   . Hx of radiation therapy   . Atrial fibrillation ([place]) 01/21/2011  . Malignant neoplasm of upper-outer quadrant of left breast in female, estrogen receptor positive ([place]) 12/26/2010  . 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 albuterol (PROVENTIL) (2.5 MG/3ML) 0.083% nebulizer solution Take 3 mLs (2.5 mg total) by nebulization every 6 (six) hours as needed for wheezing or shortness of breath.  . allopurinol (ZYLOPRIM) 300 MG tablet Take 300 mg by mouth daily.  Marland Kitchen ALPRAZolam (XANAX) 0.5 MG tablet TAKE 1 TO 2 TABLETS BY MOUTH TWICE DAILY IF NEEDED FOR ANXIETY/SLEEP  . azelastine (ASTELIN) 0.1 % nasal spray Place 2 sprays into both nostrils daily. Use in each nostril as directed  . azelastine (OPTIVAR) 0.05 % ophthalmic solution Place 1 drop  into both eyes 2 (two) times daily. (Patient taking differently: Place 1 drop into both eyes 2 (two) times daily as needed (allergies and dry eyes). )  . B Complex Vitamins (VITAMIN B COMPLEX PO) Take 1 tablet by mouth daily.  . Biotin 5000 MCG CAPS Take 5,000 mcg by mouth daily.   . budesonide-formoterol (SYMBICORT) 160-4.5 MCG/ACT inhaler Inhale 2 puffs 2 (two) times daily into the lungs.  Marland Kitchen buPROPion (WELLBUTRIN XL) 150 MG 24 hr tablet Take 1 tablet (150 mg total) by mouth daily.  . cholecalciferol (VITAMIN D) 1000 UNITS tablet Take 1,000 Units by mouth daily.  Marland Kitchen co-enzyme Q-10  50 MG capsule Take 400 mg by mouth daily.  . colchicine (COLCRYS) 0.6 MG tablet Take 0.6 mg by mouth daily as needed (flare  up).   . Cyanocobalamin (VITAMIN B-12 PO) Take 1 tablet by mouth daily.  Marland Kitchen escitalopram (LEXAPRO) 10 MG tablet Take 1 tablet (10 mg total) by mouth daily.  . Evolocumab with Infusor ([place]) 420 MG/3.5ML SOCT Inject 1 Units into the skin every 30 (thirty) days.  Marland Kitchen ezetimibe (ZETIA) 10 MG tablet Take 1/2 to 1 tablet by mouth daily as directed  . gabapentin (NEURONTIN) 100 MG capsule Take 1 capsule (100 mg total) daily by mouth. (Patient taking differently: Take 100 mg by mouth daily as needed (foot pain). )  . hydrochlorothiazide (HYDRODIURIL) 25 MG tablet Take 2 tablets (50 mg total) by mouth daily.  Marland Kitchen levocetirizine (XYZAL) 5 MG tablet Take 1 tablet (5 mg total) by mouth at bedtime.  Marland Kitchen levothyroxine (SYNTHROID, LEVOTHROID) 75 MCG tablet Take 1 tablet (75 mcg total) by mouth daily.  . metoprolol succinate (TOPROL-XL) 25 MG 24 hr tablet Take 0.5 tablets (12.5 mg total) by mouth daily. Please make an appt with Dr. Irish Lack for December for future refills. 1st attempt.  . montelukast (SINGULAIR) 10 MG tablet TAKE 1 TABLET BY MOUTH AT BEDTIME  . nystatin cream (MYCOSTATIN) Apply 1 application topically daily as needed for dry skin.   . potassium chloride (K-DUR) 10 MEQ tablet Take 3 tablets (30 mEq total) by mouth daily.  Marland Kitchen PROAIR HFA 108 (90 Base) MCG/ACT inhaler INHALE 2 PUFFS BY MOUTH INTO THE LUNGS AS NEEDED FOR WHEEZING/SHORTNESS OF BREATH  . Pyridoxine HCl (VITAMIN B-6 PO) Take 1 tablet by mouth daily.  Marland Kitchen RA ALLERGY RELIEF 180 MG tablet take 1 tablet by mouth once daily  . rivaroxaban (XARELTO) 20 MG TABS tablet Take 1 tablet (20 mg total) by mouth daily with supper.  . rosuvastatin (CRESTOR) 10 MG tablet Take 1 tablet by mouth twice a week.  . Saccharomyces boulardii (FLORASTOR PO) Take 2 capsules by mouth daily.  . traMADol-acetaminophen (ULTRACET)  37.5-325 MG tablet Take 1-2 tablets by mouth every 6 (six) hours as needed for moderate pain or severe pain.   Marland Kitchen triamcinolone (NASACORT AQ) 55 MCG/ACT AERO nasal inhaler Place 2 sprays daily into the nose.  . vitamin C (ASCORBIC ACID) 500 MG tablet Take 500 mg by mouth daily.  Marland Kitchen VITAMIN E PO Take 1 tablet by mouth daily.    Allergies: Patient is allergic to crestor [rosuvastatin calcium]; hydrocodone-acetaminophen; lidocaine hcl; lipitor [atorvastatin calcium]; procaine; rosuvastatin; theophylline; vicodin [hydrocodone-acetaminophen]; codeine; oxycodone-acetaminophen; propoxyphene; quinine derivatives; sulfa antibiotics; atorvastatin; ephedrine; cephalexin; epinephrine; penicillins; and theophyllines. Family History: Patient family history includes Asthma in her sister; Cancer in her father; Heart attack in her brother and father; Heart disease in her brother and father. Social History:  Patient  reports  that she quit smoking about 55 years ago. She has a 4.00 pack-year smoking history. She has never used smokeless tobacco. She reports that she does not drink alcohol or use drugs.  Review of Systems: Constitutional: Negative for fever malaise or anorexia Cardiovascular: negative for chest pain Respiratory: negative for SOB or persistent cough Gastrointestinal: negative for abdominal pain  Objective  Vitals: BP 120/80 (BP Location: Left Arm, Patient Position: Sitting, Cuff Size: Large)   Pulse 67   Temp 98.6 F (37 C) (Oral)   Resp 14   Ht '5\' 6"'$  (1.676 m)   Wt (!) 306 lb (138.8 kg)   SpO2 98%   BMI 49.39 kg/m  General: no acute distress , A&Ox3 HEENT: PEERL, conjunctiva normal, Oropharynx moist,neck is supple Cardiovascular:  RRR without murmur or gallop.  Respiratory:  Good breath sounds bilaterally, CTAB with normal respiratory effort Skin:  Warm, left ant shin with large ovoid erythematous tender area with spreading erythema, no fluctuance, mild warmth, tr pedal and lower leg  edema. Nl distal pulses.      Commons side effects, risks, benefits, and alternatives for medications and treatment plan prescribed today were discussed, and the patient expressed understanding of the given instructions. Patient is instructed to call or message via MyChart if he/she has any questions or concerns regarding our treatment plan. No barriers to understanding were identified. We discussed Red Flag symptoms and signs in detail. Patient expressed understanding regarding what to do in case of urgent or emergency type symptoms.   Medication list was reconciled, printed and provided to the patient in AVS. Patient instructions and summary information was reviewed with the patient as documented in the AVS. This note was prepared with assistance of Dragon voice recognition software. Occasional wrong-word or sound-a-like substitutions may have occurred due to the inherent limitations of voice recognition software

## 2017-12-18 ENCOUNTER — Encounter: Payer: Self-pay | Admitting: Internal Medicine

## 2017-12-18 ENCOUNTER — Ambulatory Visit (INDEPENDENT_AMBULATORY_CARE_PROVIDER_SITE_OTHER): Payer: Medicare Other | Admitting: Internal Medicine

## 2017-12-18 VITALS — BP 128/84 | HR 82 | Temp 98.3°F | Ht 66.0 in | Wt 295.0 lb

## 2017-12-18 DIAGNOSIS — I10 Essential (primary) hypertension: Secondary | ICD-10-CM | POA: Diagnosis not present

## 2017-12-18 DIAGNOSIS — L03116 Cellulitis of left lower limb: Secondary | ICD-10-CM | POA: Diagnosis not present

## 2017-12-18 DIAGNOSIS — R739 Hyperglycemia, unspecified: Secondary | ICD-10-CM

## 2017-12-18 MED ORDER — DOXYCYCLINE HYCLATE 100 MG PO TABS: 100.0000 mg | ORAL_TABLET | Freq: Two times a day (BID) | ORAL | 0 refills | Status: AC

## 2017-12-18 MED ORDER — TRIAMCINOLONE ACETONIDE 0.1 % EX CREA: 1.0000 "application " | TOPICAL_CREAM | Freq: Two times a day (BID) | CUTANEOUS | 2 refills | Status: DC

## 2017-12-18 NOTE — Assessment & Plan Note (Signed)
stable overall by history and exam, recent data reviewed with pt, and pt to continue medical treatment as before,  to f/u any worsening symptoms or concerns Lab Results  Component Value Date   HGBA1C 6.3 09/18/2017

## 2017-12-18 NOTE — Assessment & Plan Note (Signed)
stable overall by history and exam, recent data reviewed with pt, and pt to continue medical treatment as before,  to f/u any worsening symptoms or concerns BP Readings from Last 3 Encounters:  12/18/17 128/84  12/13/17 120/80  11/14/17 128/70

## 2017-12-18 NOTE — Patient Instructions (Signed)
Please continue all other medications as before, and refills have been done for the antibx  Please have the pharmacy call with any other refills you may need.  Please continue your efforts at being more active, low cholesterol diet, and weight control.  Please keep your appointments with your specialists as you may have planned

## 2017-12-18 NOTE — Progress Notes (Signed)
Subjective:    Patient ID: [name], female    DOB: Nov 24, 1935, 82 y.o.   MRN: [id number]  HPI  Here to f/u cellulitis, states LLE red, tender swelling essentially resolved and slow healing lesion to the proximal anteroir pretibial area is probably continuing to slowly healing.   Pt denies fever, wt loss, night sweats, loss of appetite, or other constitutional symptoms  Pt denies chest pain, increased sob or doe, wheezing, orthopnea, PND, increased LE swelling, palpitations, dizziness or syncope.   Pt denies polydipsia, polyuria,  Past Medical History:  Diagnosis Date  . Allergy   . Anxiety   . Arthritis    no cartlidge in knees  . Asthma   . Bradycardia    a. atenolol stopped due to this, HR 40s.  . Breast cancer ([place]) Receptor + her2 _ 12/26/2010   left  . CAD in native artery    a.  CAD s/p RCA stent 2000 with residual mild-mod disease of left system 2001.  Marland Kitchen Chronic atrial fibrillation ([place])   . COPD (chronic obstructive pulmonary disease) ([place])   . Essential hypertension 03/24/2015  . Gout    post operative  . Hx of radiation therapy 04/02/11 -05/20/11   left breast  . Hyperlipidemia   . Hypothyroidism 03/24/2015  . Incontinence of urine   . Malignant neoplasm of upper-outer quadrant of female breast ([place]) 12/26/2010  . Osteoporosis 03/24/2015  . Peripheral neuropathy 03/24/2015  . Plantar fasciitis   . Sleep apnea    Past Surgical History:  Procedure Laterality Date  . BREAST LUMPECTOMY W/ NEEDLE LOCALIZATION  01/29/2011   Left with [facility] Dr Margot Chimes  . CHOLECYSTECTOMY  1955  . CORONARY ANGIOPLASTY WITH STENT PLACEMENT  2000  . DILATION AND CURETTAGE OF UTERUS  1976   and 1996  . HAND SURGERY  1992   tendons between thumb and forefinger  . SKIN BIOPSY     1994, 2006, 2008, 2010, 2011, 2011, 2012-  pre-cancerous  . TONSILLECTOMY  1955    reports that she quit smoking about 55 years ago. She has a 4.00 pack-year smoking history. She has never used smokeless tobacco.  She reports that she does not drink alcohol or use drugs. family history includes Asthma in her sister; Cancer in her father; Heart attack in her brother and father; Heart disease in her brother and father. Allergies  Allergen Reactions  . Crestor [Rosuvastatin Calcium]     Severe liver function problems  . Hydrocodone-Acetaminophen Anxiety and Itching  . Lidocaine Hcl Other (See Comments)    Novacaine:  Becomes very shaky  . Lipitor [Atorvastatin Calcium] Other (See Comments)    Elevated liver function   . Procaine Other (See Comments)    shaking  . Rosuvastatin Other (See Comments)    Severe liver function problems  . Theophylline Other (See Comments)    Becomes very shaky skaking  . Vicodin [Hydrocodone-Acetaminophen] Itching    Itching all over  . Codeine Nausea Only, Anxiety and Other (See Comments)    Also complained of dizziness.  Has same problems with oxycodone and hydrocodone Visual Disturbance, Balance Difficulty, Dizzy "Room Spinning; "Swimming" Balance, vision, nausea, dizzy, "swimming", "room spinning"  . Oxycodone-Acetaminophen Nausea Only, Anxiety and Other (See Comments)    Visual Disturbance, Balance Difficulty, Dizzy "Room Spinning; "Swimming"  . Propoxyphene Anxiety, Nausea Only and Other (See Comments)    Visual Disturbance, Balance Difficulty, Dizzy "Room Spinning; "Swimming"  . Quinine Derivatives Nausea Only    dizzy  .  Sulfa Antibiotics Nausea Only    Also complained of dizziness  . Atorvastatin Other (See Comments)    Severe liver function problems  . Ephedrine Other (See Comments)    shaking  . Cephalexin Rash  . Epinephrine Other (See Comments)    Patient becomes "shaky" shaking  . Penicillins Rash    Pt reported all over body rash in 1958 Has patient had a PCN reaction causing immediate rash, facial/tongue/throat swelling, SOB or lightheadedness with hypotension:Yes Has patient had a PCN reaction causing severe rash involving mucus membranes or  skin necrosis: No Has patient had a PCN reaction that required hospitalization: No Has patient had a PCN reaction occurring within the last 10 years:No    . Theophyllines Other (See Comments)    Patient becomes "shaky"   Current Outpatient Medications on File Prior to Visit  Medication Sig Dispense Refill  . albuterol (PROVENTIL) (2.5 MG/3ML) 0.083% nebulizer solution Take 3 mLs (2.5 mg total) by nebulization every 6 (six) hours as needed for wheezing or shortness of breath. 75 mL 12  . allopurinol (ZYLOPRIM) 300 MG tablet Take 300 mg by mouth daily.  1  . ALPRAZolam (XANAX) 0.5 MG tablet TAKE 1 TO 2 TABLETS BY MOUTH TWICE DAILY IF NEEDED FOR ANXIETY/SLEEP 60 tablet 3  . azelastine (ASTELIN) 0.1 % nasal spray Place 2 sprays into both nostrils daily. Use in each nostril as directed    . azelastine (OPTIVAR) 0.05 % ophthalmic solution Place 1 drop into both eyes 2 (two) times daily. (Patient taking differently: Place 1 drop into both eyes 2 (two) times daily as needed (allergies and dry eyes). ) 6 mL 12  . B Complex Vitamins (VITAMIN B COMPLEX PO) Take 1 tablet by mouth daily.    . Biotin 5000 MCG CAPS Take 5,000 mcg by mouth daily.     . budesonide-formoterol (SYMBICORT) 160-4.5 MCG/ACT inhaler Inhale 2 puffs 2 (two) times daily into the lungs.    Marland Kitchen buPROPion (WELLBUTRIN XL) 150 MG 24 hr tablet Take 1 tablet (150 mg total) by mouth daily. 90 tablet 3  . cholecalciferol (VITAMIN D) 1000 UNITS tablet Take 1,000 Units by mouth daily.    Marland Kitchen co-enzyme Q-10 50 MG capsule Take 400 mg by mouth daily.    . colchicine (COLCRYS) 0.6 MG tablet Take 0.6 mg by mouth daily as needed (flare  up).     . Cyanocobalamin (VITAMIN B-12 PO) Take 1 tablet by mouth daily.    Marland Kitchen escitalopram (LEXAPRO) 10 MG tablet Take 1 tablet (10 mg total) by mouth daily. 90 tablet 2  . Evolocumab with Infusor ([place]) 420 MG/3.5ML SOCT Inject 1 Units into the skin every 30 (thirty) days.    Marland Kitchen ezetimibe (ZETIA) 10  MG tablet Take 1/2 to 1 tablet by mouth daily as directed 90 tablet 0  . gabapentin (NEURONTIN) 100 MG capsule Take 1 capsule (100 mg total) daily by mouth. (Patient taking differently: Take 100 mg by mouth daily as needed (foot pain). ) 90 capsule 3  . hydrochlorothiazide (HYDRODIURIL) 25 MG tablet Take 2 tablets (50 mg total) by mouth daily. 180 tablet 2  . levocetirizine (XYZAL) 5 MG tablet Take 1 tablet (5 mg total) by mouth at bedtime. 90 tablet 2  . levothyroxine (SYNTHROID, LEVOTHROID) 75 MCG tablet Take 1 tablet (75 mcg total) by mouth daily. 90 tablet 2  . metoprolol succinate (TOPROL-XL) 25 MG 24 hr tablet Take 0.5 tablets (12.5 mg total) by mouth daily. Please make an  appt with Dr. Irish Lack for December for future refills. 1st attempt. 45 tablet 1  . montelukast (SINGULAIR) 10 MG tablet TAKE 1 TABLET BY MOUTH AT BEDTIME 90 tablet 0  . nystatin cream (MYCOSTATIN) Apply 1 application topically daily as needed for dry skin.   0  . potassium chloride (K-DUR) 10 MEQ tablet Take 3 tablets (30 mEq total) by mouth daily. 270 tablet 2  . PROAIR HFA 108 (90 Base) MCG/ACT inhaler INHALE 2 PUFFS BY MOUTH INTO THE LUNGS AS NEEDED FOR WHEEZING/SHORTNESS OF BREATH 17 g 3  . Pyridoxine HCl (VITAMIN B-6 PO) Take 1 tablet by mouth daily.    Marland Kitchen RA ALLERGY RELIEF 180 MG tablet take 1 tablet by mouth once daily 30 tablet 5  . rivaroxaban (XARELTO) 20 MG TABS tablet Take 1 tablet (20 mg total) by mouth daily with supper. 90 tablet 1  . rosuvastatin (CRESTOR) 10 MG tablet Take 1 tablet by mouth twice a week. 27 tablet 0  . Saccharomyces boulardii (FLORASTOR PO) Take 2 capsules by mouth daily.    . traMADol-acetaminophen (ULTRACET) 37.5-325 MG tablet Take 1-2 tablets by mouth every 6 (six) hours as needed for moderate pain or severe pain.   0  . triamcinolone (NASACORT AQ) 55 MCG/ACT AERO nasal inhaler Place 2 sprays daily into the nose. 1 Inhaler 12  . vitamin C (ASCORBIC ACID) 500 MG tablet Take 500 mg by mouth  daily.    Marland Kitchen VITAMIN E PO Take 1 tablet by mouth daily.     No current facility-administered medications on file prior to visit.    Review of Systems  Constitutional: Negative for other unusual diaphoresis or sweats HENT: Negative for ear discharge or swelling Eyes: Negative for other worsening visual disturbances Respiratory: Negative for stridor or other swelling  Gastrointestinal: Negative for worsening distension or other blood Genitourinary: Negative for retention or other urinary change Musculoskeletal: Negative for other MSK pain or swelling Skin: Negative for color change or other new lesions Neurological: Negative for worsening tremors and other numbness  Psychiatric/Behavioral: Negative for worsening agitation or other fatigue All other system neg per pt    Objective:   Physical Exam BP 128/84   Pulse 82   Temp 98.3 F (36.8 C) (Oral)   Ht '5\' 6"'$  (1.676 m)   Wt 295 lb (133.8 kg)   SpO2 96%   BMI 47.61 kg/m  VS noted, not ill appearing Constitutional: Pt appears in NAD HENT: Head: NCAT.  Right Ear: External ear normal.  Left Ear: External ear normal.  Eyes: . Pupils are equal, round, and reactive to light. Conjunctivae and EOM are normal Nose: without d/c or deformity Neck: Neck supple. Gross normal ROM Cardiovascular: Normal rate and regular rhythm.   Pulmonary/Chest: Effort normal and breath sounds without rales or wheezing.  Neurological: Pt is alert. At baseline orientation, motor grossly intact Skin: Skin is warm. No rashes, other new lesions, trace bilat LE edema Psychiatric: Pt behavior is normal without agitation  No other exam findings      Assessment & Plan:

## 2017-12-18 NOTE — Assessment & Plan Note (Signed)
Resolved, no further antibx needed 

## 2017-12-23 DIAGNOSIS — D3131 Benign neoplasm of right choroid: Secondary | ICD-10-CM | POA: Diagnosis not present

## 2018-01-01 DIAGNOSIS — H35373 Puckering of macula, bilateral: Secondary | ICD-10-CM | POA: Diagnosis not present

## 2018-01-01 DIAGNOSIS — H43813 Vitreous degeneration, bilateral: Secondary | ICD-10-CM | POA: Diagnosis not present

## 2018-01-01 DIAGNOSIS — D3131 Benign neoplasm of right choroid: Secondary | ICD-10-CM | POA: Diagnosis not present

## 2018-01-01 DIAGNOSIS — H3091 Unspecified chorioretinal inflammation, right eye: Secondary | ICD-10-CM | POA: Diagnosis not present

## 2018-01-07 DIAGNOSIS — M1712 Unilateral primary osteoarthritis, left knee: Secondary | ICD-10-CM | POA: Diagnosis not present

## 2018-01-07 DIAGNOSIS — M1711 Unilateral primary osteoarthritis, right knee: Secondary | ICD-10-CM | POA: Diagnosis not present

## 2018-01-15 ENCOUNTER — Other Ambulatory Visit: Payer: Self-pay | Admitting: *Deleted

## 2018-01-15 DIAGNOSIS — D229 Melanocytic nevi, unspecified: Secondary | ICD-10-CM | POA: Diagnosis not present

## 2018-01-15 DIAGNOSIS — L738 Other specified follicular disorders: Secondary | ICD-10-CM | POA: Diagnosis not present

## 2018-01-15 DIAGNOSIS — C50919 Malignant neoplasm of unspecified site of unspecified female breast: Secondary | ICD-10-CM

## 2018-01-15 DIAGNOSIS — L821 Other seborrheic keratosis: Secondary | ICD-10-CM | POA: Diagnosis not present

## 2018-01-15 DIAGNOSIS — L570 Actinic keratosis: Secondary | ICD-10-CM | POA: Diagnosis not present

## 2018-01-15 DIAGNOSIS — L814 Other melanin hyperpigmentation: Secondary | ICD-10-CM | POA: Diagnosis not present

## 2018-01-19 ENCOUNTER — Ambulatory Visit (INDEPENDENT_AMBULATORY_CARE_PROVIDER_SITE_OTHER): Payer: Medicare Other | Admitting: Emergency Medicine

## 2018-01-19 DIAGNOSIS — Z23 Encounter for immunization: Secondary | ICD-10-CM

## 2018-01-22 DIAGNOSIS — R921 Mammographic calcification found on diagnostic imaging of breast: Secondary | ICD-10-CM | POA: Diagnosis not present

## 2018-01-22 DIAGNOSIS — Z853 Personal history of malignant neoplasm of breast: Secondary | ICD-10-CM | POA: Diagnosis not present

## 2018-01-22 LAB — HM MAMMOGRAPHY

## 2018-01-23 ENCOUNTER — Encounter: Payer: Self-pay | Admitting: Podiatry

## 2018-01-23 ENCOUNTER — Ambulatory Visit: Payer: Medicare Other | Admitting: Podiatry

## 2018-01-23 DIAGNOSIS — L84 Corns and callosities: Secondary | ICD-10-CM | POA: Diagnosis not present

## 2018-01-23 DIAGNOSIS — M2041 Other hammer toe(s) (acquired), right foot: Secondary | ICD-10-CM | POA: Diagnosis not present

## 2018-01-23 DIAGNOSIS — M722 Plantar fascial fibromatosis: Secondary | ICD-10-CM

## 2018-01-23 DIAGNOSIS — G629 Polyneuropathy, unspecified: Secondary | ICD-10-CM

## 2018-01-23 DIAGNOSIS — M2042 Other hammer toe(s) (acquired), left foot: Secondary | ICD-10-CM

## 2018-01-23 NOTE — Patient Instructions (Signed)
Hammer Toe Hammer toe is a change in the shape (a deformity) of your second, third, or fourth toe. The deformity causes the middle joint of your toe to stay bent. This causes pain, especially when you are wearing shoes. Hammer toe starts gradually. At first, the toe can be straightened. Gradually over time, the deformity becomes stiff and permanent. Early treatments to keep the toe straight may relieve pain. As the deformity becomes stiff and permanent, surgery may be needed to straighten the toe. What are the causes? Hammer toe is caused by abnormal bending of the toe joint that is closest to your foot. It happens gradually over time. This pulls on the muscles and connections (tendons) of the toe joint, making them weak and stiff. It is often related to wearing shoes that are too short or narrow and do not let your toes straighten. What increases the risk? You may be at greater risk for hammer toe if you:  Are female.  Are older.  Wear shoes that are too small.  Wear high-heeled shoes that pinch your toes.  Are a ballet dancer.  Have a second toe that is longer than your big toe (first toe).  Injure your foot or toe.  Have arthritis.  Have a family history of hammer toe.  Have a nerve or muscle disorder.  What are the signs or symptoms? The main symptoms of this condition are pain and deformity of the toe. The pain is worse when wearing shoes, walking, or running. Other symptoms may include:  Corns or calluses over the bent part of the toe or between the toes.  Redness and a burning feeling on the toe.  An open sore that forms on the top of the toe.  Not being able to straighten the toe.  How is this diagnosed? This condition is diagnosed based on your symptoms and a physical exam. During the exam, your health care provider will try to straighten your toe to see how stiff the deformity is. You may also have tests, such as:  A blood test to check for rheumatoid  arthritis.  An X-ray to show how severe the deformity is.  How is this treated? Treatment for this condition will depend on how stiff the deformity is. Surgery is often needed. However, sometimes a hammer toe can be straightened without surgery. Treatments that do not involve surgery include:  Taping the toe into a straightened position.  Using pads and cushions to protect the toe (orthotics).  Wearing shoes that provide enough room for the toes.  Doing toe-stretching exercises at home.  Taking an NSAID to reduce pain and swelling.  If these treatments do not help or the toe cannot be straightened, surgery is the next option. The most common surgeries used to straighten a hammer toe include:  Arthroplasty. In this procedure, part of the joint is removed, and that allows the toe to straighten.  Fusion. In this procedure, cartilage between the two bones of the joint is taken out and the bones are fused together into one longer bone.  Implantation. In this procedure, part of the bone is removed and replaced with an implant to let the toe move again.  Flexor tendon transfer. In this procedure, the tendons that curl the toes down (flexor tendons) are repositioned.  Follow these instructions at home:  Take over-the-counter and prescription medicines only as told by your health care provider.  Do toe straightening and stretching exercises as told by your health care provider.  Keep all   follow-up visits as told by your health care provider. This is important. How is this prevented?  Wear shoes that give your toes enough room and do not cause pain.  Do not wear high-heeled shoes. Contact a health care provider if:  Your pain gets worse.  Your toe becomes red or swollen.  You develop an open sore on your toe. This information is not intended to replace advice given to you by your health care provider. Make sure you discuss any questions you have with your health care  provider. Document Released: 04/19/2000 Document Revised: 11/10/2015 Document Reviewed: 08/16/2015 Elsevier Interactive Patient Education  2018 [employer].   Gabapentin capsules or tablets What is this medicine? GABAPENTIN (GA ba pen tin) is used to control partial seizures in adults with epilepsy. It is also used to treat certain types of nerve pain. This medicine may be used for other purposes; ask your health care provider or pharmacist if you have questions. COMMON BRAND NAME(S): Active-PAC with Gabapentin, Gabarone, Neurontin What should I tell my health care provider before I take this medicine? They need to know if you have any of these conditions: -kidney disease -suicidal thoughts, plans, or attempt; a previous suicide attempt by you or a family member -an unusual or allergic reaction to gabapentin, other medicines, foods, dyes, or preservatives -pregnant or trying to get pregnant -breast-feeding How should I use this medicine? Take this medicine by mouth with a glass of water. Follow the directions on the prescription label. You can take it with or without food. If it upsets your stomach, take it with food.Take your medicine at regular intervals. Do not take it more often than directed. Do not stop taking except on your doctor's advice. If you are directed to break the 600 or 800 mg tablets in half as part of your dose, the extra half tablet should be used for the next dose. If you have not used the extra half tablet within 28 days, it should be thrown away. A special MedGuide will be given to you by the pharmacist with each prescription and refill. Be sure to read this information carefully each time. Talk to your pediatrician regarding the use of this medicine in children. Special care may be needed. Overdosage: If you think you have taken too much of this medicine contact a poison control center or emergency room at once. NOTE: This medicine is only for you. Do not share this  medicine with others. What if I miss a dose? If you miss a dose, take it as soon as you can. If it is almost time for your next dose, take only that dose. Do not take double or extra doses. What may interact with this medicine? Do not take this medicine with any of the following medications: -other gabapentin products This medicine may also interact with the following medications: -alcohol -antacids -antihistamines for allergy, cough and cold -certain medicines for anxiety or sleep -certain medicines for depression or psychotic disturbances -homatropine; hydrocodone -naproxen -narcotic medicines (opiates) for pain -phenothiazines like chlorpromazine, mesoridazine, prochlorperazine, thioridazine This list may not describe all possible interactions. Give your health care provider a list of all the medicines, herbs, non-prescription drugs, or dietary supplements you use. Also tell them if you smoke, drink alcohol, or use illegal drugs. Some items may interact with your medicine. What should I watch for while using this medicine? Visit your doctor or health care professional for regular checks on your progress. You may want to keep a record at  home of how you feel your condition is responding to treatment. You may want to share this information with your doctor or health care professional at each visit. You should contact your doctor or health care professional if your seizures get worse or if you have any new types of seizures. Do not stop taking this medicine or any of your seizure medicines unless instructed by your doctor or health care professional. Stopping your medicine suddenly can increase your seizures or their severity. Wear a medical identification bracelet or chain if you are taking this medicine for seizures, and carry a card that lists all your medications. You may get drowsy, dizzy, or have blurred vision. Do not drive, use machinery, or do anything that needs mental alertness until  you know how this medicine affects you. To reduce dizzy or fainting spells, do not sit or stand up quickly, especially if you are an older patient. Alcohol can increase drowsiness and dizziness. Avoid alcoholic drinks. Your mouth may get dry. Chewing sugarless gum or sucking hard candy, and drinking plenty of water will help. The use of this medicine may increase the chance of suicidal thoughts or actions. Pay special attention to how you are responding while on this medicine. Any worsening of mood, or thoughts of suicide or dying should be reported to your health care professional right away. Women who become pregnant while using this medicine may enroll in the [place] Pregnancy Registry by calling [phone]. This registry collects information about the safety of antiepileptic drug use during pregnancy. What side effects may I notice from receiving this medicine? Side effects that you should report to your doctor or health care professional as soon as possible: -allergic reactions like skin rash, itching or hives, swelling of the face, lips, or tongue -worsening of mood, thoughts or actions of suicide or dying Side effects that usually do not require medical attention (report to your doctor or health care professional if they continue or are bothersome): -constipation -difficulty walking or controlling muscle movements -dizziness -nausea -slurred speech -tiredness -tremors -weight gain This list may not describe all possible side effects. Call your doctor for medical advice about side effects. You may report side effects to FDA at 1-800-FDA-1088. Where should I keep my medicine? Keep out of reach of children. This medicine may cause accidental overdose and death if it taken by other adults, children, or pets. Mix any unused medicine with a substance like cat litter or coffee grounds. Then throw the medicine away in a sealed container like a sealed bag or a coffee  can with a lid. Do not use the medicine after the expiration date. Store at room temperature between 15 and 30 degrees C (59 and 86 degrees F). NOTE: This sheet is a summary. It may not cover all possible information. If you have questions about this medicine, talk to your doctor, pharmacist, or health care provider.  2018 Elsevier/Gold Standard (2013-06-18 15:26:50)

## 2018-01-23 NOTE — Progress Notes (Signed)
Subjective:    Patient ID: [name], female    DOB: January 17, 1936, 82 y.o.   MRN: [id number]  HPI 82 year old female presents the office today having several concerns.  Her main concern is that she is having some neuropathy and she is been on gabapentin that she is been taking regularly for the last month.  She did stop it on Monday to see how she is feeling for today's appointment.  She has burning pain mostly at nighttime to her feet.  The gabapentin does seem to help, she is come off of it for couple days the symptoms of started come back.  She has questions about this medication.  She also has a history of left heel pain in the plantar fasciitis that she states is starting to come back as well.  She states that this is likely because she has not been as active recently.  She does use a wheelchair at times due to other issues not related to while she is here today.  She also started to get a callus on the ball of her left foot which occasionally causes discomfort.  Denies any open sores.  No recent injury or falls.  She also states that she is starting to get hammertoes.  She has no other concerns.   Review of Systems  All other systems reviewed and are negative.  Past Medical History:  Diagnosis Date  . Allergy   . Anxiety   . Arthritis    no cartlidge in knees  . Asthma   . Bradycardia    a. atenolol stopped due to this, HR 40s.  . 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 CAD in native artery    a.  CAD s/p RCA stent 2000 with residual mild-mod disease of left system 2001.  Marland Kitchen Chronic atrial fibrillation ([place])   . COPD (chronic obstructive pulmonary disease) ([place])   . Essential hypertension 03/24/2015  . Gout    post operative  . Hx of radiation therapy 04/02/11 -05/20/11   left breast  . Hyperlipidemia   . Hypothyroidism 03/24/2015  . Incontinence of urine   . Malignant neoplasm of upper-outer quadrant of female breast ([place]) 12/26/2010  . Osteoporosis  03/24/2015  . Peripheral neuropathy 03/24/2015  . Plantar fasciitis   . Sleep apnea     Past Surgical History:  Procedure Laterality Date  . BREAST LUMPECTOMY W/ NEEDLE LOCALIZATION  01/29/2011   Left with [facility] Dr Margot Chimes  . CHOLECYSTECTOMY  1955  . CORONARY ANGIOPLASTY WITH STENT PLACEMENT  2000  . DILATION AND CURETTAGE OF UTERUS  1976   and 1996  . HAND SURGERY  1992   tendons between thumb and forefinger  . SKIN BIOPSY     1994, 2006, 2008, 2010, 2011, 2011, 2012-  pre-cancerous  . TONSILLECTOMY  1955     Current Outpatient Medications:  .  albuterol (PROVENTIL) (2.5 MG/3ML) 0.083% nebulizer solution, Take 3 mLs (2.5 mg total) by nebulization every 6 (six) hours as needed for wheezing or shortness of breath., Disp: 75 mL, Rfl: 12 .  allopurinol (ZYLOPRIM) 300 MG tablet, Take 300 mg by mouth daily., Disp: , Rfl: 1 .  ALPRAZolam (XANAX) 0.5 MG tablet, TAKE 1 TO 2 TABLETS BY MOUTH TWICE DAILY IF NEEDED FOR ANXIETY/SLEEP, Disp: 60 tablet, Rfl: 3 .  azelastine (ASTELIN) 0.1 % nasal spray, Place 2 sprays into both nostrils daily. Use in each nostril as directed, Disp: , Rfl:  .  azelastine (OPTIVAR) 0.05 % ophthalmic solution, Place 1 drop into both eyes 2 (two) times daily. (Patient taking differently: Place 1 drop into both eyes 2 (two) times daily as needed (allergies and dry eyes). ), Disp: 6 mL, Rfl: 12 .  B Complex Vitamins (VITAMIN B COMPLEX PO), Take 1 tablet by mouth daily., Disp: , Rfl:  .  Biotin 5000 MCG CAPS, Take 5,000 mcg by mouth daily. , Disp: , Rfl:  .  budesonide-formoterol (SYMBICORT) 160-4.5 MCG/ACT inhaler, Inhale 2 puffs 2 (two) times daily into the lungs., Disp: , Rfl:  .  buPROPion (WELLBUTRIN XL) 150 MG 24 hr tablet, Take 1 tablet (150 mg total) by mouth daily., Disp: 90 tablet, Rfl: 3 .  cholecalciferol (VITAMIN D) 1000 UNITS tablet, Take 1,000 Units by mouth daily., Disp: , Rfl:  .  co-enzyme Q-10 50 MG capsule, Take 400 mg by mouth daily., Disp: , Rfl:  .   colchicine (COLCRYS) 0.6 MG tablet, Take 0.6 mg by mouth daily as needed (flare  up). , Disp: , Rfl:  .  Cyanocobalamin (VITAMIN B-12 PO), Take 1 tablet by mouth daily., Disp: , Rfl:  .  escitalopram (LEXAPRO) 10 MG tablet, Take 1 tablet (10 mg total) by mouth daily., Disp: 90 tablet, Rfl: 2 .  Evolocumab with Infusor ([place]) 420 MG/3.5ML SOCT, Inject 1 Units into the skin every 30 (thirty) days., Disp: , Rfl:  .  ezetimibe (ZETIA) 10 MG tablet, Take 1/2 to 1 tablet by mouth daily as directed, Disp: 90 tablet, Rfl: 0 .  gabapentin (NEURONTIN) 100 MG capsule, Take 1 capsule (100 mg total) daily by mouth. (Patient taking differently: Take 100 mg by mouth daily as needed (foot pain). ), Disp: 90 capsule, Rfl: 3 .  hydrochlorothiazide (HYDRODIURIL) 25 MG tablet, Take 2 tablets (50 mg total) by mouth daily., Disp: 180 tablet, Rfl: 2 .  levocetirizine (XYZAL) 5 MG tablet, Take 1 tablet (5 mg total) by mouth at bedtime., Disp: 90 tablet, Rfl: 2 .  levothyroxine (SYNTHROID, LEVOTHROID) 75 MCG tablet, Take 1 tablet (75 mcg total) by mouth daily., Disp: 90 tablet, Rfl: 2 .  metoprolol succinate (TOPROL-XL) 25 MG 24 hr tablet, Take 0.5 tablets (12.5 mg total) by mouth daily. Please make an appt with Dr. Irish Lack for December for future refills. 1st attempt., Disp: 45 tablet, Rfl: 1 .  montelukast (SINGULAIR) 10 MG tablet, TAKE 1 TABLET BY MOUTH AT BEDTIME, Disp: 90 tablet, Rfl: 0 .  nystatin cream (MYCOSTATIN), Apply 1 application topically daily as needed for dry skin. , Disp: , Rfl: 0 .  potassium chloride (K-DUR) 10 MEQ tablet, Take 3 tablets (30 mEq total) by mouth daily., Disp: 270 tablet, Rfl: 2 .  PROAIR HFA 108 (90 Base) MCG/ACT inhaler, INHALE 2 PUFFS BY MOUTH INTO THE LUNGS AS NEEDED FOR WHEEZING/SHORTNESS OF BREATH, Disp: 17 g, Rfl: 3 .  Pyridoxine HCl (VITAMIN B-6 PO), Take 1 tablet by mouth daily., Disp: , Rfl:  .  RA ALLERGY RELIEF 180 MG tablet, take 1 tablet by mouth once  daily, Disp: 30 tablet, Rfl: 5 .  rivaroxaban (XARELTO) 20 MG TABS tablet, Take 1 tablet (20 mg total) by mouth daily with supper., Disp: 90 tablet, Rfl: 1 .  rosuvastatin (CRESTOR) 10 MG tablet, Take 1 tablet by mouth twice a week., Disp: 27 tablet, Rfl: 0 .  Saccharomyces boulardii (FLORASTOR PO), Take 2 capsules by mouth daily., Disp: , Rfl:  .  traMADol-acetaminophen (ULTRACET) 37.5-325 MG tablet, Take 1-2 tablets  by mouth every 6 (six) hours as needed for moderate pain or severe pain. , Disp: , Rfl: 0 .  triamcinolone (NASACORT AQ) 55 MCG/ACT AERO nasal inhaler, Place 2 sprays daily into the nose., Disp: 1 Inhaler, Rfl: 12 .  triamcinolone cream (KENALOG) 0.1 %, Apply 1 application topically 2 (two) times daily. For 2 weeks, then as needed, Disp: 80 g, Rfl: 2 .  vitamin C (ASCORBIC ACID) 500 MG tablet, Take 500 mg by mouth daily., Disp: , Rfl:  .  VITAMIN E PO, Take 1 tablet by mouth daily., Disp: , Rfl:   Allergies  Allergen Reactions  . Crestor [Rosuvastatin Calcium]     Severe liver function problems  . Hydrocodone-Acetaminophen Anxiety and Itching  . Lidocaine Hcl Other (See Comments)    Novacaine:  Becomes very shaky  . Lipitor [Atorvastatin Calcium] Other (See Comments)    Elevated liver function   . Procaine Other (See Comments)    shaking  . Rosuvastatin Other (See Comments)    Severe liver function problems  . Theophylline Other (See Comments)    Becomes very shaky skaking  . Vicodin [Hydrocodone-Acetaminophen] Itching    Itching all over  . Codeine Nausea Only, Anxiety and Other (See Comments)    Also complained of dizziness.  Has same problems with oxycodone and hydrocodone Visual Disturbance, Balance Difficulty, Dizzy "Room Spinning; "Swimming" Balance, vision, nausea, dizzy, "swimming", "room spinning"  . Oxycodone-Acetaminophen Nausea Only, Anxiety and Other (See Comments)    Visual Disturbance, Balance Difficulty, Dizzy "Room Spinning; "Swimming"  . Propoxyphene  Anxiety, Nausea Only and Other (See Comments)    Visual Disturbance, Balance Difficulty, Dizzy "Room Spinning; "Swimming"  . Quinine Derivatives Nausea Only    dizzy  . Sulfa Antibiotics Nausea Only    Also complained of dizziness  . Atorvastatin Other (See Comments)    Severe liver function problems  . Ephedrine Other (See Comments)    shaking  . Cephalexin Rash  . Epinephrine Other (See Comments)    Patient becomes "shaky" shaking  . Penicillins Rash    Pt reported all over body rash in 1958 Has patient had a PCN reaction causing immediate rash, facial/tongue/throat swelling, SOB or lightheadedness with hypotension:Yes Has patient had a PCN reaction causing severe rash involving mucus membranes or skin necrosis: No Has patient had a PCN reaction that required hospitalization: No Has patient had a PCN reaction occurring within the last 10 years:No    . Theophyllines Other (See Comments)    Patient becomes "shaky"        Objective:   Physical Exam General: AAO x3, NAD  Dermatological: Minimal hyperkeratotic tissue left foot submetatarsal 5.  There is no surrounding erythema, drainage or pus or any signs of infection present.  There are no open sores, no preulcerative lesions, no rash or signs of infection present.  Vascular: DP pulses 2/4, PT pulses 1/4.  There is chronic ankle edema present.  There is no claudication symptoms.  Neruologic: Overall sensation appears to be intact with Derrel Nip monofilament.  Mildly decreased vibratory sensation.  Subjectively she is describing burning, tingling at nighttime.  Musculoskeletal: There is no area of tenderness identified to bilateral lower extremities.  Hammertoe contractures are present.  Muscular strength 5/5 in all groups tested bilateral.  Gait: Presents today in a wheelchair     Assessment & Plan:  82 year old female with neuropathy symptoms, hammertoes, hyperkeratotic lesion -Treatment options discussed including  all alternatives, risks, and complications -Etiology of symptoms were discussed -I  do think she is having neuropathy symptoms and is proven by gabapentin helping.  Discussed warfarin continue to hold the milligrams at nighttime.  I would recommend continuing the same until the basis and not take this medication as needed.  I want to take it as prescribed previously by her other physician. -Regards to the hammertoes we discussed wearing supportive shoes and inserts.  Hopefully this will help with plantar fasciitis.  We also discussed toe strengthening exercises.  Discussed stretching for the plantar fasciitis. -Moisturizer the callus daily.  Trula Slade DPM

## 2018-01-26 DIAGNOSIS — L02419 Cutaneous abscess of limb, unspecified: Secondary | ICD-10-CM | POA: Diagnosis not present

## 2018-01-29 DIAGNOSIS — H35373 Puckering of macula, bilateral: Secondary | ICD-10-CM | POA: Diagnosis not present

## 2018-01-29 DIAGNOSIS — H3091 Unspecified chorioretinal inflammation, right eye: Secondary | ICD-10-CM | POA: Diagnosis not present

## 2018-01-29 DIAGNOSIS — Z923 Personal history of irradiation: Secondary | ICD-10-CM | POA: Diagnosis not present

## 2018-01-29 DIAGNOSIS — R918 Other nonspecific abnormal finding of lung field: Secondary | ICD-10-CM | POA: Diagnosis not present

## 2018-01-29 DIAGNOSIS — Z853 Personal history of malignant neoplasm of breast: Secondary | ICD-10-CM | POA: Diagnosis not present

## 2018-01-29 DIAGNOSIS — D3131 Benign neoplasm of right choroid: Secondary | ICD-10-CM | POA: Diagnosis not present

## 2018-01-29 DIAGNOSIS — H44139 Sympathetic uveitis, unspecified eye: Secondary | ICD-10-CM | POA: Diagnosis not present

## 2018-02-06 ENCOUNTER — Ambulatory Visit (INDEPENDENT_AMBULATORY_CARE_PROVIDER_SITE_OTHER): Payer: Medicare Other | Admitting: Nurse Practitioner

## 2018-02-06 ENCOUNTER — Encounter: Payer: Self-pay | Admitting: Nurse Practitioner

## 2018-02-06 VITALS — BP 120/78 | HR 68 | Temp 98.5°F | Ht 66.0 in | Wt 302.0 lb

## 2018-02-06 DIAGNOSIS — L989 Disorder of the skin and subcutaneous tissue, unspecified: Secondary | ICD-10-CM | POA: Diagnosis not present

## 2018-02-06 DIAGNOSIS — L03116 Cellulitis of left lower limb: Secondary | ICD-10-CM | POA: Diagnosis not present

## 2018-02-06 MED ORDER — DOXYCYCLINE HYCLATE 100 MG PO TABS: 100.0000 mg | ORAL_TABLET | Freq: Two times a day (BID) | ORAL | 0 refills | Status: DC

## 2018-02-06 MED ORDER — MUPIROCIN CALCIUM 2 % EX CREA: 1.0000 "application " | TOPICAL_CREAM | Freq: Three times a day (TID) | CUTANEOUS | 0 refills | Status: DC

## 2018-02-06 NOTE — Patient Instructions (Addendum)
Apply bactroban to irritated area 3 times a day for 10 days, oral antibiotic as prescribed.  Return for follow up visit in 3-5 days.

## 2018-02-06 NOTE — Progress Notes (Signed)
[name] is a 82 y.o. female with the following history as recorded in EpicCare:  Patient Active Problem List   Diagnosis Date Noted  . Abscess of left leg 09/26/2017  . Left leg cellulitis 09/18/2017  . Whiplash 06/10/2017  . Cough 05/29/2017  . Concussion 05/29/2017  . Hammer toes of both feet 12/19/2016  . Allergic rhinitis 12/09/2016  . Pedal edema 09/17/2016  . Chest pain 07/18/2016  . Gross hematuria 04/26/2016  . Hyperglycemia 03/21/2016  . Dysuria 12/07/2015  . Fever blister 12/07/2015  . Anxiety and depression 06/30/2015  . Encounter for well adult exam with abnormal findings 06/30/2015  . Neck mass 03/24/2015  . Hypothyroidism 03/24/2015  . Gout 03/24/2015  . Essential hypertension 03/24/2015  . Asthma 03/24/2015  . Peripheral neuropathy 03/24/2015  . Osteoporosis 03/24/2015  . OSA (obstructive sleep apnea) 03/24/2015  . Morbid obesity ([place]) 03/24/2015  . Long term current use of anticoagulant therapy 03/16/2015  . Lymphadenopathy, cervical 03/16/2015  . Diarrhea 03/16/2015  . Hyperbilirubinemia 03/16/2015  . Coronary arteriosclerosis 09/22/2014  . Hyperlipidemia 07/23/2013  . Hypokalemia 02/18/2013  . Elevated bilirubin 02/18/2013  . Plantar fasciitis   . Arthritis   . Allergy   . Hx of radiation therapy   . Atrial fibrillation ([place]) 01/21/2011  . Malignant neoplasm of upper-outer quadrant of left breast in female, estrogen receptor positive ([place]) 12/26/2010  . 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 albuterol (PROVENTIL) (2.5 MG/3ML) 0.083% nebulizer solution Take 3 mLs (2.5 mg total) by nebulization every 6 (six) hours as needed for wheezing or shortness of breath. 75 mL 12  . allopurinol (ZYLOPRIM) 300 MG tablet Take 300 mg by mouth daily.  1  . ALPRAZolam (XANAX) 0.5 MG tablet TAKE 1 TO 2 TABLETS BY MOUTH TWICE DAILY IF NEEDED FOR ANXIETY/SLEEP 60 tablet 3  . azelastine  (ASTELIN) 0.1 % nasal spray Place 2 sprays into both nostrils daily. Use in each nostril as directed    . azelastine (OPTIVAR) 0.05 % ophthalmic solution Place 1 drop into both eyes 2 (two) times daily. (Patient taking differently: Place 1 drop into both eyes 2 (two) times daily as needed (allergies and dry eyes). ) 6 mL 12  . B Complex Vitamins (VITAMIN B COMPLEX PO) Take 1 tablet by mouth daily.    . Biotin 5000 MCG CAPS Take 5,000 mcg by mouth daily.     . budesonide-formoterol (SYMBICORT) 160-4.5 MCG/ACT inhaler Inhale 2 puffs 2 (two) times daily into the lungs.    Marland Kitchen buPROPion (WELLBUTRIN XL) 150 MG 24 hr tablet Take 1 tablet (150 mg total) by mouth daily. 90 tablet 3  . cholecalciferol (VITAMIN D) 1000 UNITS tablet Take 1,000 Units by mouth daily.    Marland Kitchen co-enzyme Q-10 50 MG capsule Take 400 mg by mouth daily.    . colchicine (COLCRYS) 0.6 MG tablet Take 0.6 mg by mouth daily as needed (flare  up).     . Cyanocobalamin (VITAMIN B-12 PO) Take 1 tablet by mouth daily.    Marland Kitchen escitalopram (LEXAPRO) 10 MG tablet Take 1 tablet (10 mg total) by mouth daily. 90 tablet 2  . Evolocumab with Infusor ([place]) 420 MG/3.5ML SOCT Inject 1 Units into the skin every 30 (thirty) days.    Marland Kitchen ezetimibe (ZETIA) 10 MG tablet Take 1/2 to 1 tablet by mouth daily as directed 90 tablet 0  . gabapentin (NEURONTIN) 100 MG  capsule Take 1 capsule (100 mg total) daily by mouth. (Patient taking differently: Take 100 mg by mouth daily as needed (foot pain). ) 90 capsule 3  . hydrochlorothiazide (HYDRODIURIL) 25 MG tablet Take 2 tablets (50 mg total) by mouth daily. 180 tablet 2  . levocetirizine (XYZAL) 5 MG tablet Take 1 tablet (5 mg total) by mouth at bedtime. 90 tablet 2  . levothyroxine (SYNTHROID, LEVOTHROID) 75 MCG tablet Take 1 tablet (75 mcg total) by mouth daily. 90 tablet 2  . metoprolol succinate (TOPROL-XL) 25 MG 24 hr tablet Take 0.5 tablets (12.5 mg total) by mouth daily. Please make an appt with  Dr. Irish Lack for December for future refills. 1st attempt. 45 tablet 1  . montelukast (SINGULAIR) 10 MG tablet TAKE 1 TABLET BY MOUTH AT BEDTIME 90 tablet 0  . nystatin cream (MYCOSTATIN) Apply 1 application topically daily as needed for dry skin.   0  . potassium chloride (K-DUR) 10 MEQ tablet Take 3 tablets (30 mEq total) by mouth daily. 270 tablet 2  . PROAIR HFA 108 (90 Base) MCG/ACT inhaler INHALE 2 PUFFS BY MOUTH INTO THE LUNGS AS NEEDED FOR WHEEZING/SHORTNESS OF BREATH 17 g 3  . Pyridoxine HCl (VITAMIN B-6 PO) Take 1 tablet by mouth daily.    Marland Kitchen RA ALLERGY RELIEF 180 MG tablet take 1 tablet by mouth once daily 30 tablet 5  . rivaroxaban (XARELTO) 20 MG TABS tablet Take 1 tablet (20 mg total) by mouth daily with supper. 90 tablet 1  . rosuvastatin (CRESTOR) 10 MG tablet Take 1 tablet by mouth twice a week. 27 tablet 0  . Saccharomyces boulardii (FLORASTOR PO) Take 2 capsules by mouth daily.    . traMADol-acetaminophen (ULTRACET) 37.5-325 MG tablet Take 1-2 tablets by mouth every 6 (six) hours as needed for moderate pain or severe pain.   0  . triamcinolone (NASACORT AQ) 55 MCG/ACT AERO nasal inhaler Place 2 sprays daily into the nose. 1 Inhaler 12  . triamcinolone cream (KENALOG) 0.1 % Apply 1 application topically 2 (two) times daily. For 2 weeks, then as needed 80 g 2  . vitamin C (ASCORBIC ACID) 500 MG tablet Take 500 mg by mouth daily.    Marland Kitchen VITAMIN E PO Take 1 tablet by mouth daily.    Marland Kitchen doxycycline (VIBRA-TABS) 100 MG tablet Take 1 tablet (100 mg total) by mouth 2 (two) times daily. 20 tablet 0  . mupirocin cream (BACTROBAN) 2 % Apply 1 application topically 3 (three) times daily. 15 g 0   No current facility-administered medications for this visit.     Allergies: Crestor [rosuvastatin calcium]; Hydrocodone-acetaminophen; Lidocaine hcl; Lipitor [atorvastatin calcium]; Procaine; Rosuvastatin; Theophylline; Vicodin [hydrocodone-acetaminophen]; Codeine; Oxycodone-acetaminophen;  Propoxyphene; Quinine derivatives; Sulfa antibiotics; Atorvastatin; Ephedrine; Cephalexin; Epinephrine; Penicillins; and Theophyllines  Past Medical History:  Diagnosis Date  . Allergy   . Anxiety   . Arthritis    no cartlidge in knees  . Asthma   . Bradycardia    a. atenolol stopped due to this, HR 40s.  . Breast cancer ([place]) Receptor + her2 _ 12/26/2010   left  . CAD in native artery    a.  CAD s/p RCA stent 2000 with residual mild-mod disease of left system 2001.  Marland Kitchen Chronic atrial fibrillation   . COPD (chronic obstructive pulmonary disease) ([place])   . Essential hypertension 03/24/2015  . Gout    post operative  . Hx of radiation therapy 04/02/11 -05/20/11   left breast  . Hyperlipidemia   .  Hypothyroidism 03/24/2015  . Incontinence of urine   . Malignant neoplasm of upper-outer quadrant of female breast ([place]) 12/26/2010  . Osteoporosis 03/24/2015  . Peripheral neuropathy 03/24/2015  . Plantar fasciitis   . Sleep apnea     Past Surgical History:  Procedure Laterality Date  . BREAST LUMPECTOMY W/ NEEDLE LOCALIZATION  01/29/2011   Left with [facility] Dr Margot Chimes  . CHOLECYSTECTOMY  1955  . CORONARY ANGIOPLASTY WITH STENT PLACEMENT  2000  . DILATION AND CURETTAGE OF UTERUS  1976   and 1996  . HAND SURGERY  1992   tendons between thumb and forefinger  . SKIN BIOPSY     1994, 2006, 2008, 2010, 2011, 2011, 2012-  pre-cancerous  . TONSILLECTOMY  1955    Family History  Problem Relation Age of Onset  . Heart disease Father   . Cancer Father        intestinal polyps  . Heart attack Father   . Asthma Sister   . Heart disease Brother   . Heart attack Brother     Social History   Tobacco Use  . Smoking status: Former Smoker    Packs/day: 1.00    Years: 4.00    Pack years: 4.00    Last attempt to quit: 12/05/1962    Years since quitting: 55.2  . Smokeless tobacco: Never Used  Substance Use Topics  . Alcohol use: No     Subjective:   Ms Linsley is here today for evaluation  of possible LLE infection. She has had a history of recurrent cellulitis to LLE since a foreign body was removed from her leg earlier this year (wood splinter that had been present under her skin to left anterior calf for at least 20 years), last treated with course of doxycyline in august for cellulitis with improvement. She tells me she actually saw surgeon for last follow up visit last week and was told no further follow up needed. She noticed yesterday increasing redness, warmth to anterior LLE and redness and clear drainage from scabbed area to her left ant calf that has been present since her surgery. She applied triamcinolone cream to her leg that has helped the swelling but leg remains red and still having some drainage from scabbed area. She denies fevers, chills, cp, sob,  nausea, vomiting. Says she overall feels well   ROS: See HPI  Objective:  Vitals:   02/06/18 1426  BP: 120/78  Pulse: 68  Temp: 98.5 F (36.9 C)  TempSrc: Oral  SpO2: 95%  Weight: (!) 302 lb (137 kg)  Height: '5\' 6"'$  (1.676 m)    General: Well developed, well nourished, in no acute distress  Skin : Warm and dry. Left anterior shin with circular scabbed area surrounded by erythema, darkened skin Head: Normocephalic and atraumatic  Eyes: Sclera and conjunctiva clear; pupils round and reactive to light; extraocular movements intact  Oropharynx: Pink, supple. No suspicious lesions  Neck: Supple Lungs: Respirations unlabored; clear to auscultation bilaterally without wheeze, rales, rhonchi  CVS exam: normal rate, regular rhythm, distal pulses intact Musculoskeletal: No deformities; no active joint inflammation  Neurologic: Alert and oriented; speech intact; face symmetrical; moves all extremities well; CNII-XII intact without focal deficit   Assessment:  1. Left leg cellulitis   2. Skin problem     Plan:   1. Left leg cellulitis Course of doxycyline for signs of cellulitis F/U in 3-5 days for recheck -  doxycycline (VIBRA-TABS) 100 MG tablet; Take 1 tablet (100 mg total)  by mouth 2 (two) times daily.  Dispense: 20 tablet; Refill: 0  2. Skin problem Topical abx per patient request F/U in 3-5 days for recheck - mupirocin cream (BACTROBAN) 2 %; Apply 1 application topically 3 (three) times daily.  Dispense: 15 g; Refill: 0  Home management, red flags and return precautions including when to seek immediate care discussed and printed on AVS  No follow-ups on file.  No orders of the defined types were placed in this encounter.   Requested Prescriptions   Signed Prescriptions Disp Refills  . mupirocin cream (BACTROBAN) 2 % 15 g 0    Sig: Apply 1 application topically 3 (three) times daily.  Marland Kitchen doxycycline (VIBRA-TABS) 100 MG tablet 20 tablet 0    Sig: Take 1 tablet (100 mg total) by mouth 2 (two) times daily.

## 2018-02-19 ENCOUNTER — Other Ambulatory Visit: Payer: Self-pay | Admitting: Internal Medicine

## 2018-02-23 ENCOUNTER — Telehealth: Payer: Self-pay

## 2018-02-23 ENCOUNTER — Other Ambulatory Visit: Payer: Self-pay | Admitting: Pharmacist

## 2018-02-23 MED ORDER — GABAPENTIN 100 MG PO CAPS: 100.0000 mg | ORAL_CAPSULE | Freq: Every day | ORAL | 2 refills | Status: DC

## 2018-02-23 MED ORDER — RIVAROXABAN 20 MG PO TABS: 20.0000 mg | ORAL_TABLET | Freq: Every day | ORAL | 1 refills | Status: DC

## 2018-02-23 NOTE — Telephone Encounter (Signed)
Done. 30 day supply sent to local pharmacy.   Copied from [place] [id number]. Topic: General - Other >> Feb 23, 2018 11:06 AM [name] wrote: Reason for CRM: Pt called in wanting to have the medication gabapentin (NEURONTIN) 100 MG capsule sent to the pharmacy on [name] monthly basis. She says if she can not have this done please give her call back if we need more information  Please advise

## 2018-02-23 NOTE — Telephone Encounter (Signed)
Crcl 130ml/min  Appt with Dr. Clayton Bibles scheduled for 02/27/18.

## 2018-02-26 ENCOUNTER — Ambulatory Visit: Payer: Self-pay

## 2018-02-26 DIAGNOSIS — D3131 Benign neoplasm of right choroid: Secondary | ICD-10-CM | POA: Diagnosis not present

## 2018-02-26 DIAGNOSIS — H35373 Puckering of macula, bilateral: Secondary | ICD-10-CM | POA: Diagnosis not present

## 2018-02-26 DIAGNOSIS — H3091 Unspecified chorioretinal inflammation, right eye: Secondary | ICD-10-CM | POA: Diagnosis not present

## 2018-02-26 NOTE — Telephone Encounter (Signed)
Patient is calling to cancel her appointment in the morning- she has been at [facility] all day doing studies on her eye. She has spots growing in the retina. Patient is exhausted. Advised patient that in order to get treated we need to see her - asked if she could possibly come later- she agrees.

## 2018-02-26 NOTE — Telephone Encounter (Signed)
Pt called after noted left lower leg was swollen and dark red to purple in color. Pt stated that it is warm to touch. Pt has had issues with left lower leg since May. Pt has been on clindamycin that was given after an ED visit 09/05/17. Antibiotic was changed to doxycycline 09/26/17. Pt also has been using triamcinolone cream to the affected area.  Pt had an incision and drainage to pretibial area in July and has been seen in the office numerous times for continuing issues related to left lower leg swelling and redness.  Pt stated her incision has healed except for 1 small area.  Pt is requesting to call in more antibiotics and more cream. Pt informed that she needs to be seen asap for evaluation.  Offered to make appointment today but pt was hurried stating she has to be a [facility] today for eye evaluation. Appointment accepted for tomorrow morning.  Pt stated that she can be reached on her husbands phone [phone] and his name is Kylia Grajales.  Reason for Disposition . [1] Red area or streak [2] large (> 2 in. or 5 cm)  Answer Assessment - Initial Assessment Questions 1. ONSET: "When did the swelling start?" (e.g., minutes, hours, days)     Noted it today 2. LOCATION: "What part of the leg is swollen?"  "Are both legs swollen or just one leg?"     Left lower leg  3. SEVERITY: "How bad is the swelling?" (e.g., localized; mild, moderate, severe)  - Localized - small area of swelling localized to one leg  - MILD pedal edema - swelling limited to foot and ankle, pitting edema < 1/4 inch (6 mm) deep, rest and elevation eliminate most or all swelling  - MODERATE edema - swelling of lower leg to knee, pitting edema > 1/4 inch (6 mm) deep, rest and elevation only partially reduce swelling  - SEVERE edema - swelling extends above knee, facial or hand swelling present      moderate 4. REDNESS: "Does the swelling look red or infected?"     Pt states that the area to the front of her lower leg is dk red and  purple 5. PAIN: "Is the swelling painful to touch?" If so, ask: "How painful is it?"   (Scale 1-10; mild, moderate or severe)     mild 6. FEVER: "Do you have a fever?" If so, ask: "What is it, how was it measured, and when did it start?"      no 7. CAUSE: "What do you think is causing the leg swelling?"     cellulitis 8. MEDICAL HISTORY: "Do you have a history of heart failure, kidney disease, liver failure, or cancer?"     Breast cancer, stent to heart 9. RECURRENT SYMPTOM: "Have you had leg swelling before?" If so, ask: "When was the last time?" "What happened that time?"     Yes- August 10. OTHER SYMPTOMS: "Do you have any other symptoms?" (e.g., chest pain, difficulty breathing)       no 11. PREGNANCY: "Is there any chance you are pregnant?" "When was your last menstrual period?"       n/a  Protocols used: LEG SWELLING AND EDEMA-A-AH

## 2018-02-26 NOTE — Telephone Encounter (Signed)
fyi

## 2018-02-27 ENCOUNTER — Encounter: Payer: Self-pay | Admitting: Family

## 2018-02-27 ENCOUNTER — Ambulatory Visit: Payer: Medicare Other | Admitting: Interventional Cardiology

## 2018-02-27 ENCOUNTER — Ambulatory Visit (INDEPENDENT_AMBULATORY_CARE_PROVIDER_SITE_OTHER): Payer: Medicare Other | Admitting: Family

## 2018-02-27 ENCOUNTER — Ambulatory Visit: Payer: Medicare Other | Admitting: Internal Medicine

## 2018-02-27 ENCOUNTER — Encounter: Payer: Self-pay | Admitting: Interventional Cardiology

## 2018-02-27 VITALS — BP 132/84 | HR 67 | Ht 66.0 in | Wt 301.6 lb

## 2018-02-27 DIAGNOSIS — I482 Chronic atrial fibrillation, unspecified: Secondary | ICD-10-CM | POA: Diagnosis not present

## 2018-02-27 DIAGNOSIS — I2510 Atherosclerotic heart disease of native coronary artery without angina pectoris: Secondary | ICD-10-CM

## 2018-02-27 DIAGNOSIS — L03116 Cellulitis of left lower limb: Secondary | ICD-10-CM | POA: Diagnosis not present

## 2018-02-27 DIAGNOSIS — R600 Localized edema: Secondary | ICD-10-CM | POA: Diagnosis not present

## 2018-02-27 DIAGNOSIS — E782 Mixed hyperlipidemia: Secondary | ICD-10-CM

## 2018-02-27 MED ORDER — MUPIROCIN 2 % EX OINT: TOPICAL_OINTMENT | CUTANEOUS | 1 refills | Status: DC

## 2018-02-27 MED ORDER — DOXYCYCLINE HYCLATE 100 MG PO TABS: 100.0000 mg | ORAL_TABLET | Freq: Two times a day (BID) | ORAL | 1 refills | Status: DC

## 2018-02-27 NOTE — Progress Notes (Signed)
[name] is a 82 y.o. female with the following history as recorded in EpicCare:  Patient Active Problem List   Diagnosis Date Noted  . Abscess of left leg 09/26/2017  . Left leg cellulitis 09/18/2017  . Whiplash 06/10/2017  . Cough 05/29/2017  . Concussion 05/29/2017  . Hammer toes of both feet 12/19/2016  . Allergic rhinitis 12/09/2016  . Pedal edema 09/17/2016  . Chest pain 07/18/2016  . Gross hematuria 04/26/2016  . Hyperglycemia 03/21/2016  . Dysuria 12/07/2015  . Fever blister 12/07/2015  . Anxiety and depression 06/30/2015  . Encounter for well adult exam with abnormal findings 06/30/2015  . Neck mass 03/24/2015  . Hypothyroidism 03/24/2015  . Gout 03/24/2015  . Essential hypertension 03/24/2015  . Asthma 03/24/2015  . Peripheral neuropathy 03/24/2015  . Osteoporosis 03/24/2015  . OSA (obstructive sleep apnea) 03/24/2015  . Morbid obesity ([place]) 03/24/2015  . Long term current use of anticoagulant therapy 03/16/2015  . Lymphadenopathy, cervical 03/16/2015  . Diarrhea 03/16/2015  . Hyperbilirubinemia 03/16/2015  . Coronary arteriosclerosis 09/22/2014  . Hyperlipidemia 07/23/2013  . Hypokalemia 02/18/2013  . Elevated bilirubin 02/18/2013  . Plantar fasciitis   . Arthritis   . Allergy   . Hx of radiation therapy   . Atrial fibrillation ([place]) 01/21/2011  . Malignant neoplasm of upper-outer quadrant of left breast in female, estrogen receptor positive ([place]) 12/26/2010  . 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 albuterol (PROVENTIL HFA;VENTOLIN HFA) 108 (90 Base) MCG/ACT inhaler INHALE 2 PUFFS BY MOUTH INTO THE LUNGS IF NEEDED FOR WHEEZING OR SHORTNESS OF BREATH 17 g 0  . albuterol (PROVENTIL) (2.5 MG/3ML) 0.083% nebulizer solution Take 3 mLs (2.5 mg total) by nebulization every 6 (six) hours as needed for wheezing or shortness of breath. 75 mL 12  . allopurinol (ZYLOPRIM) 300 MG tablet  Take 300 mg by mouth daily.  1  . ALPRAZolam (XANAX) 0.5 MG tablet TAKE 1 TO 2 TABLETS BY MOUTH TWICE DAILY IF NEEDED FOR ANXIETY/SLEEP 60 tablet 3  . azelastine (ASTELIN) 0.1 % nasal spray Place 2 sprays into both nostrils daily. Use in each nostril as directed    . azelastine (OPTIVAR) 0.05 % ophthalmic solution Place 1 drop into both eyes 2 (two) times daily. (Patient taking differently: Place 1 drop into both eyes 2 (two) times daily as needed (allergies and dry eyes). ) 6 mL 12  . B Complex Vitamins (VITAMIN B COMPLEX PO) Take 1 tablet by mouth daily.    . Biotin 5000 MCG CAPS Take 5,000 mcg by mouth daily.     . budesonide-formoterol (SYMBICORT) 160-4.5 MCG/ACT inhaler Inhale 2 puffs 2 (two) times daily into the lungs.    Marland Kitchen buPROPion (WELLBUTRIN XL) 150 MG 24 hr tablet Take 1 tablet (150 mg total) by mouth daily. 90 tablet 3  . cholecalciferol (VITAMIN D) 1000 UNITS tablet Take 1,000 Units by mouth daily.    Marland Kitchen co-enzyme Q-10 50 MG capsule Take 400 mg by mouth daily.    . colchicine (COLCRYS) 0.6 MG tablet Take 0.6 mg by mouth daily as needed (flare  up).     . Cyanocobalamin (VITAMIN B-12 PO) Take 1 tablet by mouth daily.    Marland Kitchen doxycycline (VIBRA-TABS) 100 MG tablet Take 1 tablet (100 mg total) by mouth 2 (two) times daily. 20 tablet 1  . escitalopram (LEXAPRO) 10 MG tablet Take 1 tablet (10 mg total) by mouth daily.  90 tablet 2  . Evolocumab with Infusor ([place]) 420 MG/3.5ML SOCT Inject 1 Units into the skin every 30 (thirty) days.    Marland Kitchen ezetimibe (ZETIA) 10 MG tablet Take 1/2 to 1 tablet by mouth daily as directed 90 tablet 0  . gabapentin (NEURONTIN) 100 MG capsule Take 1 capsule (100 mg total) by mouth daily. 30 capsule 2  . hydrochlorothiazide (HYDRODIURIL) 25 MG tablet Take 2 tablets (50 mg total) by mouth daily. 180 tablet 2  . levocetirizine (XYZAL) 5 MG tablet Take 1 tablet (5 mg total) by mouth at bedtime. 90 tablet 2  . levothyroxine (SYNTHROID, LEVOTHROID) 75  MCG tablet Take 1 tablet (75 mcg total) by mouth daily. 90 tablet 2  . metoprolol succinate (TOPROL-XL) 25 MG 24 hr tablet Take 0.5 tablets (12.5 mg total) by mouth daily. Please make an appt with Dr. Irish Lack for December for future refills. 1st attempt. 45 tablet 1  . montelukast (SINGULAIR) 10 MG tablet TAKE 1 TABLET BY MOUTH AT BEDTIME 90 tablet 0  . mupirocin cream (BACTROBAN) 2 % Apply 1 application topically 3 (three) times daily. 15 g 0  . nystatin cream (MYCOSTATIN) Apply 1 application topically daily as needed for dry skin.   0  . potassium chloride (K-DUR) 10 MEQ tablet Take 3 tablets (30 mEq total) by mouth daily. 270 tablet 2  . Pyridoxine HCl (VITAMIN B-6 PO) Take 1 tablet by mouth daily.    Marland Kitchen RA ALLERGY RELIEF 180 MG tablet take 1 tablet by mouth once daily 30 tablet 5  . rivaroxaban (XARELTO) 20 MG TABS tablet Take 1 tablet (20 mg total) by mouth daily with supper. 90 tablet 1  . rosuvastatin (CRESTOR) 10 MG tablet Take 1 tablet by mouth twice a week. 27 tablet 0  . Saccharomyces boulardii (FLORASTOR PO) Take 2 capsules by mouth daily.    . traMADol-acetaminophen (ULTRACET) 37.5-325 MG tablet Take 1-2 tablets by mouth every 6 (six) hours as needed for moderate pain or severe pain.   0  . triamcinolone (NASACORT AQ) 55 MCG/ACT AERO nasal inhaler Place 2 sprays daily into the nose. 1 Inhaler 12  . triamcinolone cream (KENALOG) 0.1 % Apply 1 application topically 2 (two) times daily. For 2 weeks, then as needed 80 g 2  . vitamin C (ASCORBIC ACID) 500 MG tablet Take 500 mg by mouth daily.    Marland Kitchen VITAMIN E PO Take 1 tablet by mouth daily.    . mupirocin ointment (BACTROBAN) 2 % Use twice a day as directed 30 g 1   No current facility-administered medications for this visit.     Allergies: Crestor [rosuvastatin calcium]; Hydrocodone-acetaminophen; Lidocaine hcl; Lipitor [atorvastatin calcium]; Procaine; Rosuvastatin; Theophylline; Vicodin [hydrocodone-acetaminophen]; Codeine;  Oxycodone-acetaminophen; Propoxyphene; Quinine derivatives; Sulfa antibiotics; Atorvastatin; Ephedrine; Cephalexin; Epinephrine; Penicillins; and Theophyllines  Past Medical History:  Diagnosis Date  . Allergy   . Anxiety   . Arthritis    no cartlidge in knees  . Asthma   . Bradycardia    a. atenolol stopped due to this, HR 40s.  . 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 CAD in native artery    a.  CAD s/p RCA stent 2000 with residual mild-mod disease of left system 2001.  Marland Kitchen Chronic atrial fibrillation   . COPD (chronic obstructive pulmonary disease) ([place])   . Essential hypertension 03/24/2015  . Gout    post operative  . Hx of radiation therapy 04/02/11 -05/20/11   left  breast  . Hyperlipidemia   . Hypothyroidism 03/24/2015  . Incontinence of urine   . Malignant neoplasm of upper-outer quadrant of female breast ([place]) 12/26/2010  . Osteoporosis 03/24/2015  . Peripheral neuropathy 03/24/2015  . Plantar fasciitis   . Sleep apnea     Past Surgical History:  Procedure Laterality Date  . BREAST LUMPECTOMY W/ NEEDLE LOCALIZATION  01/29/2011   Left with [facility] Dr Margot Chimes  . CHOLECYSTECTOMY  1955  . CORONARY ANGIOPLASTY WITH STENT PLACEMENT  2000  . DILATION AND CURETTAGE OF UTERUS  1976   and 1996  . HAND SURGERY  1992   tendons between thumb and forefinger  . SKIN BIOPSY     1994, 2006, 2008, 2010, 2011, 2011, 2012-  pre-cancerous  . TONSILLECTOMY  1955    Family History  Problem Relation Age of Onset  . Heart disease Father   . Cancer Father        intestinal polyps  . Heart attack Father   . Asthma Sister   . Heart disease Brother   . Heart attack Brother     Social History   Tobacco Use  . Smoking status: Former Smoker    Packs/day: 1.00    Years: 4.00    Pack years: 4.00    Last attempt to quit: 12/05/1962    Years since quitting: 55.2  . Smokeless tobacco: Never Used  Substance Use Topics  . Alcohol use: No    Subjective:  Patient is  accompanied by her husband today; history of recurrent left leg cellulitis/ venous stasis insufficiency; notes that area of concern actually looks better today than yesterday; had surgery in May of this year to remove splintered piece of wood that caused area to abscess; she has had concerns about how her leg has healed from surgery; no fever or drainage noted; No calf pain;    Objective:  Vitals:   02/27/18 1331  BP: 128/72  Pulse: 73  Temp: 98.9 F (37.2 C)  TempSrc: Oral  SpO2: 95%  Height: _0  (1.676 m)    General: Well developed, well nourished, in no acute distress  Skin : Warm and dry.  Head: Normocephalic and atraumatic  Lungs: Respirations unlabored;  Extremities: No edema, cyanosis, clubbing; area of localized redness noted over left lower leg Vessels: Symmetric bilaterally  Neurologic: Alert and oriented; speech intact; face symmetrical; moves all extremities well; CNII-XII intact without focal deficit  Assessment:  1. Left leg cellulitis     Plan:  Chronic problem for patient; Rx for Doxycycline 100 mg bid x 10 days, Bactroban; elevate legs as much as possible; follow-up worse, no better.  May need to consider follow-up with vascular surgeon if symptoms continue to recur so frequently.   No follow-ups on file.  No orders of the defined types were placed in this encounter.   Requested Prescriptions   Signed Prescriptions Disp Refills  . mupirocin ointment (BACTROBAN) 2 % 30 g 1    Sig: Use twice a day as directed  . doxycycline (VIBRA-TABS) 100 MG tablet 20 tablet 1    Sig: Take 1 tablet (100 mg total) by mouth 2 (two) times daily.

## 2018-02-27 NOTE — Patient Instructions (Signed)

## 2018-02-27 NOTE — Progress Notes (Signed)
Cardiology Office Note   Date:  02/27/2018   ID:  AMBRIELLA [name], DOB 20-Oct-1935, MRN [id number]  PCP:  Biagio Borg, MD    No chief complaint on file.  AFib  Wt Readings from Last 3 Encounters:  02/06/18 (!) 302 lb (137 kg)  12/18/17 295 lb (133.8 kg)  12/13/17 (!) 306 lb (138.8 kg)       History of Present Illness: [name] is a 82 y.o. female  with history of chronic atrial fib, morbid obesity, CAD s/p RCA stent 2000 with residual mild-mod disease of left system 2001, hyperlipidemia, asthma, breast CA, anxiety, arthritis, HTN, gout, OSA, hypothyroidism who presents to f/u bradycardia. Last cath 2001 showed 50% mD1, 20-25% LCx, 20-25% ISR of RCA stent. Last nuc 01/2011 showed small partially reversible defect in anterolateral area, could represent ischemia but could be due to breast attenuation, EF 67%.  She was seen in clinic 03/20/17 for routine f/u and was noted to bradycardic at 41bpm (AF) so atenolol was stopped. Most recent labs 03/20/17 showed K 3.3, Cr 0.82, LDL 129, Hgb 13.4, normal LFTs, TSH 5.04 prompting titration of KCl and levothyroxine by PCP.  She felt better after the atenolol was stopped.  She has noticed after stopping atenolol that she would feel her heart pound faster when she does activity.  Metoprolol 12.5 BID was added back for this sensation.   She has been on Repatha as well.   She has been feeling well on the low dose metoprolol.  Rare palpitations.  No bleeding problems. Occasional bruising.   She had an abscess in the left shin.  She has been told she had a vein problem.  She gets a cellulitis at times in the left shin and uses antibiotics. Rare leg swelling.   No DVT.   Denies : Chest pain. Dizziness.  Nitroglycerin use. Orthopnea. Paroxysmal nocturnal dyspnea.  Syncope.    Occasional anxiety feeling  Past Medical History:  Diagnosis Date  . Allergy   . Anxiety   . Arthritis    no cartlidge in knees  . Asthma   . Bradycardia    a.  atenolol stopped due to this, HR 40s.  . 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 CAD in native artery    a.  CAD s/p RCA stent 2000 with residual mild-mod disease of left system 2001.  Marland Kitchen Chronic atrial fibrillation   . COPD (chronic obstructive pulmonary disease) ([place])   . Essential hypertension 03/24/2015  . Gout    post operative  . Hx of radiation therapy 04/02/11 -05/20/11   left breast  . Hyperlipidemia   . Hypothyroidism 03/24/2015  . Incontinence of urine   . Malignant neoplasm of upper-outer quadrant of female breast ([place]) 12/26/2010  . Osteoporosis 03/24/2015  . Peripheral neuropathy 03/24/2015  . Plantar fasciitis   . Sleep apnea     Past Surgical History:  Procedure Laterality Date  . BREAST LUMPECTOMY W/ NEEDLE LOCALIZATION  01/29/2011   Left with [facility] Dr Margot Chimes  . CHOLECYSTECTOMY  1955  . CORONARY ANGIOPLASTY WITH STENT PLACEMENT  2000  . DILATION AND CURETTAGE OF UTERUS  1976   and 1996  . HAND SURGERY  1992   tendons between thumb and forefinger  . SKIN BIOPSY     1994, 2006, 2008, 2010, 2011, 2011, 2012-  pre-cancerous  . TONSILLECTOMY  1955     Current Outpatient Medications  Medication Sig Dispense Refill  .  albuterol (PROVENTIL HFA;VENTOLIN HFA) 108 (90 Base) MCG/ACT inhaler INHALE 2 PUFFS BY MOUTH INTO THE LUNGS IF NEEDED FOR WHEEZING OR SHORTNESS OF BREATH 17 g 0  . albuterol (PROVENTIL) (2.5 MG/3ML) 0.083% nebulizer solution Take 3 mLs (2.5 mg total) by nebulization every 6 (six) hours as needed for wheezing or shortness of breath. 75 mL 12  . allopurinol (ZYLOPRIM) 300 MG tablet Take 300 mg by mouth daily.  1  . ALPRAZolam (XANAX) 0.5 MG tablet TAKE 1 TO 2 TABLETS BY MOUTH TWICE DAILY IF NEEDED FOR ANXIETY/SLEEP 60 tablet 3  . azelastine (ASTELIN) 0.1 % nasal spray Place 2 sprays into both nostrils daily. Use in each nostril as directed    . azelastine (OPTIVAR) 0.05 % ophthalmic solution Place 1 drop into both eyes 2 (two) times  daily. (Patient taking differently: Place 1 drop into both eyes 2 (two) times daily as needed (allergies and dry eyes). ) 6 mL 12  . B Complex Vitamins (VITAMIN B COMPLEX PO) Take 1 tablet by mouth daily.    . Biotin 5000 MCG CAPS Take 5,000 mcg by mouth daily.     . budesonide-formoterol (SYMBICORT) 160-4.5 MCG/ACT inhaler Inhale 2 puffs 2 (two) times daily into the lungs.    Marland Kitchen buPROPion (WELLBUTRIN XL) 150 MG 24 hr tablet Take 1 tablet (150 mg total) by mouth daily. 90 tablet 3  . cholecalciferol (VITAMIN D) 1000 UNITS tablet Take 1,000 Units by mouth daily.    Marland Kitchen co-enzyme Q-10 50 MG capsule Take 400 mg by mouth daily.    . colchicine (COLCRYS) 0.6 MG tablet Take 0.6 mg by mouth daily as needed (flare  up).     . Cyanocobalamin (VITAMIN B-12 PO) Take 1 tablet by mouth daily.    Marland Kitchen doxycycline (VIBRA-TABS) 100 MG tablet Take 1 tablet (100 mg total) by mouth 2 (two) times daily. 20 tablet 0  . escitalopram (LEXAPRO) 10 MG tablet Take 1 tablet (10 mg total) by mouth daily. 90 tablet 2  . Evolocumab with Infusor ([place]) 420 MG/3.5ML SOCT Inject 1 Units into the skin every 30 (thirty) days.    Marland Kitchen ezetimibe (ZETIA) 10 MG tablet Take 1/2 to 1 tablet by mouth daily as directed 90 tablet 0  . gabapentin (NEURONTIN) 100 MG capsule Take 1 capsule (100 mg total) by mouth daily. 30 capsule 2  . hydrochlorothiazide (HYDRODIURIL) 25 MG tablet Take 2 tablets (50 mg total) by mouth daily. 180 tablet 2  . levocetirizine (XYZAL) 5 MG tablet Take 1 tablet (5 mg total) by mouth at bedtime. 90 tablet 2  . levothyroxine (SYNTHROID, LEVOTHROID) 75 MCG tablet Take 1 tablet (75 mcg total) by mouth daily. 90 tablet 2  . metoprolol succinate (TOPROL-XL) 25 MG 24 hr tablet Take 0.5 tablets (12.5 mg total) by mouth daily. Please make an appt with Dr. Irish Lack for December for future refills. 1st attempt. 45 tablet 1  . montelukast (SINGULAIR) 10 MG tablet TAKE 1 TABLET BY MOUTH AT BEDTIME 90 tablet 0  .  mupirocin cream (BACTROBAN) 2 % Apply 1 application topically 3 (three) times daily. 15 g 0  . mupirocin ointment (BACTROBAN) 2 % Use twice a day as directed 30 g 1  . nystatin cream (MYCOSTATIN) Apply 1 application topically daily as needed for dry skin.   0  . potassium chloride (K-DUR) 10 MEQ tablet Take 3 tablets (30 mEq total) by mouth daily. 270 tablet 2  . Pyridoxine HCl (VITAMIN B-6 PO) Take 1 tablet  by mouth daily.    Marland Kitchen RA ALLERGY RELIEF 180 MG tablet take 1 tablet by mouth once daily 30 tablet 5  . rivaroxaban (XARELTO) 20 MG TABS tablet Take 1 tablet (20 mg total) by mouth daily with supper. 90 tablet 1  . rosuvastatin (CRESTOR) 10 MG tablet Take 1 tablet by mouth twice a week. 27 tablet 0  . Saccharomyces boulardii (FLORASTOR PO) Take 2 capsules by mouth daily.    . traMADol-acetaminophen (ULTRACET) 37.5-325 MG tablet Take 1-2 tablets by mouth every 6 (six) hours as needed for moderate pain or severe pain.   0  . triamcinolone (NASACORT AQ) 55 MCG/ACT AERO nasal inhaler Place 2 sprays daily into the nose. 1 Inhaler 12  . triamcinolone cream (KENALOG) 0.1 % Apply 1 application topically 2 (two) times daily. For 2 weeks, then as needed 80 g 2  . vitamin C (ASCORBIC ACID) 500 MG tablet Take 500 mg by mouth daily.    Marland Kitchen VITAMIN E PO Take 1 tablet by mouth daily.     No current facility-administered medications for this visit.     Allergies:   Crestor [rosuvastatin calcium]; Hydrocodone-acetaminophen; Lidocaine hcl; Lipitor [atorvastatin calcium]; Procaine; Rosuvastatin; Theophylline; Vicodin [hydrocodone-acetaminophen]; Codeine; Oxycodone-acetaminophen; Propoxyphene; Quinine derivatives; Sulfa antibiotics; Atorvastatin; Ephedrine; Cephalexin; Epinephrine; Penicillins; and Theophyllines    Social History:  The patient  reports that she quit smoking about 55 years ago. She has a 4.00 pack-year smoking history. She has never used smokeless tobacco. She reports that she does not drink alcohol  or use drugs.   Family History:  The patient's family history includes Asthma in her sister; Cancer in her father; Heart attack in her brother and father; Heart disease in her brother and father.    ROS:  Please see the history of present illness.   Otherwise, review of systems are positive for leg swelling.   All other systems are reviewed and negative.    PHYSICAL EXAM: VS:  There were no vitals taken for this visit. , BMI There is no height or weight on file to calculate BMI. GEN: Well nourished, well developed, in no acute distress  HEENT: normal  Neck: no JVD, carotid bruits, or masses Cardiac: irregularly irregular; no murmurs, rubs, or gallops,no edema  Respiratory:  clear to auscultation bilaterally, normal work of breathing GI: soft, nontender, nondistended, + BS MS: no deformity or atrophy  Skin: warm and dry, left shin discolored Neuro:  Strength and sensation are intact Psych: euthymic mood, full affect   EKG:   The ekg ordered today demonstrates AFib, rate controlled   Recent Labs: 09/15/2017: BUN 15; Creatinine, Ser 0.80; Potassium 3.2; Sodium 142 09/18/2017: ALT 12; Hemoglobin 13.5; Platelets 175.0; TSH 3.14   Lipid Panel    Component Value Date/Time   CHOL 152 09/18/2017 1050   CHOL 92 (L) 05/08/2017 1603   TRIG 174.0 (H) 09/18/2017 1050   HDL 42.00 09/18/2017 1050   HDL 47 05/08/2017 1603   CHOLHDL 4 09/18/2017 1050   VLDL 34.8 09/18/2017 1050   LDLCALC 75 09/18/2017 1050   LDLCALC 25 05/08/2017 1603   LDLDIRECT 107.0 09/17/2016 1221     Other studies Reviewed: Additional studies/ records that were reviewed today with results demonstrating: .   ASSESSMENT AND PLAN:  1. Chronic AFib : rate controlled.  Xarelto for stroke prevention.  CBC [facility]Met checked at [facility].  2. CAD: No angina.  Continue aggressive secondary prevention.  Exercise limited by knee pain.  She uses a walker at home.  3. Hyperlipidemia: LDL 75. Continue lipid lowering therapy.Crestor  and Zetia together for her have been effective.   4. LE edema: elevate legs.  Could try compression stockings but will need to be careful with the skin on her left shin.   Current medicines are reviewed at length with the patient today.  The patient concerns regarding her medicines were addressed.  The following changes have been made:  No change  Labs/ tests ordered today include:  No orders of the defined types were placed in this encounter.   Recommend 150 minutes/week of aerobic exercise Low fat, low carb, high fiber diet recommended  Disposition:   FU in 1 year   Signed, Larae Grooms, MD  02/27/2018 1:54 PM    [facility] [address], [place], [place]  [address] Phone: [phone]; Fax: [phone]

## 2018-03-10 ENCOUNTER — Other Ambulatory Visit: Payer: Self-pay | Admitting: Internal Medicine

## 2018-03-12 ENCOUNTER — Other Ambulatory Visit: Payer: Self-pay | Admitting: Internal Medicine

## 2018-03-19 ENCOUNTER — Ambulatory Visit (INDEPENDENT_AMBULATORY_CARE_PROVIDER_SITE_OTHER): Payer: Medicare Other | Admitting: Internal Medicine

## 2018-03-19 ENCOUNTER — Encounter: Payer: Self-pay | Admitting: Internal Medicine

## 2018-03-19 VITALS — BP 118/78 | HR 60 | Temp 97.8°F | Ht 66.0 in | Wt 297.0 lb

## 2018-03-19 DIAGNOSIS — R739 Hyperglycemia, unspecified: Secondary | ICD-10-CM

## 2018-03-19 DIAGNOSIS — J45909 Unspecified asthma, uncomplicated: Secondary | ICD-10-CM | POA: Diagnosis not present

## 2018-03-19 DIAGNOSIS — Z0000 Encounter for general adult medical examination without abnormal findings: Secondary | ICD-10-CM

## 2018-03-19 NOTE — Assessment & Plan Note (Signed)

## 2018-03-19 NOTE — Assessment & Plan Note (Signed)
stable overall by history and exam, recent data reviewed with pt, and pt to continue medical treatment as before,  to f/u any worsening symptoms or concerns  

## 2018-03-19 NOTE — Patient Instructions (Signed)
Please continue all other medications as before, and refills have been done if requested.  Please have the pharmacy call with any other refills you may need.  Please continue your efforts at being more active, low cholesterol diet, and weight control.  You are otherwise up to date with prevention measures today.  Please keep your appointments with your specialists as you may have planned  Please return in 6 months, or sooner if needed 

## 2018-03-19 NOTE — Progress Notes (Signed)
Subjective:    Patient ID: [name], female    DOB: 31-Oct-1935, 82 y.o.   MRN: [id number]  HPI  Here for wellness and f/u;  Overall doing ok;  Pt denies Chest pain, worsening SOB, DOE, wheezing, orthopnea, PND, worsening LE edema, palpitations, dizziness or syncope.  Pt denies neurological change such as new headache, facial or extremity weakness.  Pt denies polydipsia, polyuria, or low sugar symptoms. Pt states overall good compliance with treatment and medications, good tolerability, and has been trying to follow appropriate diet.  Pt denies worsening depressive symptoms, suicidal ideation or panic. No fever, night sweats, wt loss, loss of appetite, or other constitutional symptoms.  Pt states good ability with ADL's, has low fall risk, home safety reviewed and adequate, no other significant changes in hearing or vision, and only not active with exercise. Wt Readings from Last 3 Encounters:  03/19/18 297 lb (134.7 kg)  02/27/18 (!) 301 lb 9.6 oz (136.8 kg)  02/06/18 (!) 302 lb (137 kg)  Seeing [facility] for retinal tear with multiple labs sept 29 2019 and she is not wanting more.  Did see podiatry, asked to continue the gabapentin same dose for pain. Past Medical History:  Diagnosis Date  . Allergy   . Anxiety   . Arthritis    no cartlidge in knees  . Asthma   . Bradycardia    a. atenolol stopped due to this, HR 40s.  . 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 CAD in native artery    a.  CAD s/p RCA stent 2000 with residual mild-mod disease of left system 2001.  Marland Kitchen Chronic atrial fibrillation   . COPD (chronic obstructive pulmonary disease) ([place])   . Essential hypertension 03/24/2015  . Gout    post operative  . Hx of radiation therapy 04/02/11 -05/20/11   left breast  . Hyperlipidemia   . Hypothyroidism 03/24/2015  . Incontinence of urine   . Malignant neoplasm of upper-outer quadrant of female breast ([place]) 12/26/2010  . Osteoporosis 03/24/2015  . Peripheral  neuropathy 03/24/2015  . Plantar fasciitis   . Sleep apnea    Past Surgical History:  Procedure Laterality Date  . BREAST LUMPECTOMY W/ NEEDLE LOCALIZATION  01/29/2011   Left with [facility] Dr Margot Chimes  . CHOLECYSTECTOMY  1955  . CORONARY ANGIOPLASTY WITH STENT PLACEMENT  2000  . DILATION AND CURETTAGE OF UTERUS  1976   and 1996  . HAND SURGERY  1992   tendons between thumb and forefinger  . SKIN BIOPSY     1994, 2006, 2008, 2010, 2011, 2011, 2012-  pre-cancerous  . TONSILLECTOMY  1955    reports that she quit smoking about 55 years ago. She has a 4.00 pack-year smoking history. She has never used smokeless tobacco. She reports that she does not drink alcohol or use drugs. family history includes Asthma in her sister; Cancer in her father; Heart attack in her brother and father; Heart disease in her brother and father. Allergies  Allergen Reactions  . Crestor [Rosuvastatin Calcium]     Severe liver function problems  . Hydrocodone-Acetaminophen Anxiety and Itching  . Lidocaine Hcl Other (See Comments)    Novacaine:  Becomes very shaky  . Lipitor [Atorvastatin Calcium] Other (See Comments)    Elevated liver function   . Procaine Other (See Comments)    shaking  . Rosuvastatin Other (See Comments)    Severe liver function problems  . Theophylline Other (See  Comments)    Becomes very shaky skaking skaking  . Vicodin [Hydrocodone-Acetaminophen] Itching    Itching all over  . Codeine Nausea Only, Anxiety and Other (See Comments)    Also complained of dizziness.  Has same problems with oxycodone and hydrocodone Visual Disturbance, Balance Difficulty, Dizzy "Room Spinning; "Swimming" Balance, vision, nausea, dizzy, "swimming", "room spinning" Balance, vision, nausea, dizzy, "swimming", "room spinning"  . Oxycodone-Acetaminophen Nausea Only, Anxiety and Other (See Comments)    Visual Disturbance, Balance Difficulty, Dizzy "Room Spinning; "Swimming"  . Propoxyphene Anxiety, Nausea Only  and Other (See Comments)    Visual Disturbance, Balance Difficulty, Dizzy "Room Spinning; "Swimming" Balance, vision, nausea, dizzy, "swimming", "room spinning" Balance, vision, nausea, dizzy, "swimming, "room spinning"  . Quinine Derivatives Nausea Only    dizzy  . Sulfa Antibiotics Nausea Only    Also complained of dizziness  . Atorvastatin Other (See Comments)    Severe liver function problems  . Ephedrine Other (See Comments)    shaking  . Other Other (See Comments)    Quinine causes nausea, dizzy  . Oxycodone Other (See Comments)    Balance, vision, nausea, dizzy, "swimming", "room spinning"  . Cephalexin Rash  . Epinephrine Other (See Comments)    Patient becomes "shaky" shaking shaking  . Penicillins Rash    Pt reported all over body rash in 1958 Has patient had a PCN reaction causing immediate rash, facial/tongue/throat swelling, SOB or lightheadedness with hypotension:Yes Has patient had a PCN reaction causing severe rash involving mucus membranes or skin necrosis: No Has patient had a PCN reaction that required hospitalization: No Has patient had a PCN reaction occurring within the last 10 years:No    . Theophyllines Other (See Comments)    Patient becomes "shaky"   Current Outpatient Medications on File Prior to Visit  Medication Sig Dispense Refill  . albuterol (PROVENTIL) (2.5 MG/3ML) 0.083% nebulizer solution Take 3 mLs (2.5 mg total) by nebulization every 6 (six) hours as needed for wheezing or shortness of breath. 75 mL 12  . allopurinol (ZYLOPRIM) 300 MG tablet Take 300 mg by mouth daily.  1  . ALPRAZolam (XANAX) 0.5 MG tablet TAKE 1 TO 2 TABLETS BY MOUTH TWICE DAILY IF NEEDED FOR ANXIETY/SLEEP 60 tablet 3  . azelastine (ASTELIN) 0.1 % nasal spray Place 2 sprays into both nostrils daily. Use in each nostril as directed    . azelastine (OPTIVAR) 0.05 % ophthalmic solution Place 1 drop into both eyes 2 (two) times daily. (Patient taking differently: Place 1 drop  into both eyes 2 (two) times daily as needed (allergies and dry eyes). ) 6 mL 12  . B Complex Vitamins (VITAMIN B COMPLEX PO) Take 1 tablet by mouth daily.    . Biotin 5000 MCG CAPS Take 5,000 mcg by mouth daily.     . budesonide-formoterol (SYMBICORT) 160-4.5 MCG/ACT inhaler Inhale 2 puffs 2 (two) times daily into the lungs.    . cholecalciferol (VITAMIN D) 1000 UNITS tablet Take 1,000 Units by mouth daily.    Marland Kitchen co-enzyme Q-10 50 MG capsule Take 400 mg by mouth daily.    . colchicine (COLCRYS) 0.6 MG tablet Take 0.6 mg by mouth daily as needed (flare  up).     . Cyanocobalamin (VITAMIN B-12 PO) Take 1 tablet by mouth daily.    Marland Kitchen doxycycline (VIBRA-TABS) 100 MG tablet Take 1 tablet (100 mg total) by mouth 2 (two) times daily. 20 tablet 1  . escitalopram (LEXAPRO) 10 MG tablet Take 1 tablet (10  mg total) by mouth daily. 90 tablet 2  . Evolocumab with Infusor ([place]) 420 MG/3.5ML SOCT Inject 1 Units into the skin every 30 (thirty) days.    Marland Kitchen ezetimibe (ZETIA) 10 MG tablet Take 1/2 to 1 tablet by mouth daily as directed 90 tablet 0  . gabapentin (NEURONTIN) 100 MG capsule Take 1 capsule (100 mg total) by mouth daily. 30 capsule 2  . hydrochlorothiazide (HYDRODIURIL) 25 MG tablet Take 2 tablets (50 mg total) by mouth daily. 180 tablet 2  . levocetirizine (XYZAL) 5 MG tablet Take 1 tablet (5 mg total) by mouth at bedtime. 90 tablet 2  . levothyroxine (SYNTHROID, LEVOTHROID) 75 MCG tablet Take 1 tablet (75 mcg total) by mouth daily. 90 tablet 2  . metoprolol succinate (TOPROL-XL) 25 MG 24 hr tablet Take 0.5 tablets (12.5 mg total) by mouth daily. Please make an appt with Dr. Irish Lack for December for future refills. 1st attempt. 45 tablet 1  . montelukast (SINGULAIR) 10 MG tablet TAKE 1 TABLET BY MOUTH AT BEDTIME 90 tablet 0  . mupirocin ointment (BACTROBAN) 2 % Use twice a day as directed 30 g 1  . nystatin cream (MYCOSTATIN) Apply 1 application topically daily as needed for dry  skin.   0  . potassium chloride (K-DUR) 10 MEQ tablet Take 3 tablets (30 mEq total) by mouth daily. 270 tablet 2  . potassium chloride SA (K-DUR,KLOR-CON) 20 MEQ tablet TAKE 1 TABLET BY MOUTH EVERY DAY 90 tablet 0  . PROAIR HFA 108 (90 Base) MCG/ACT inhaler INHALE 2 PUFFS BY MOUTH INTO THE LUNGS IF NEEDED FOR WHEEZING OR SHORTNESS OF BREATH 8.5 g 2  . Pyridoxine HCl (VITAMIN B-6 PO) Take 1 tablet by mouth daily.    Marland Kitchen RA ALLERGY RELIEF 180 MG tablet take 1 tablet by mouth once daily 30 tablet 5  . rivaroxaban (XARELTO) 20 MG TABS tablet Take 1 tablet (20 mg total) by mouth daily with supper. 90 tablet 1  . rosuvastatin (CRESTOR) 10 MG tablet Take 1 tablet by mouth twice a week. 27 tablet 0  . Saccharomyces boulardii (FLORASTOR PO) Take 2 capsules by mouth daily.    . traMADol-acetaminophen (ULTRACET) 37.5-325 MG tablet Take 1-2 tablets by mouth every 6 (six) hours as needed for moderate pain or severe pain.   0  . triamcinolone (NASACORT AQ) 55 MCG/ACT AERO nasal inhaler Place 2 sprays daily into the nose. 1 Inhaler 12  . triamcinolone cream (KENALOG) 0.1 % Apply 1 application topically 2 (two) times daily. For 2 weeks, then as needed 80 g 2  . vitamin C (ASCORBIC ACID) 500 MG tablet Take 500 mg by mouth daily.    Marland Kitchen VITAMIN E PO Take 1 tablet by mouth daily.     No current facility-administered medications on file prior to visit.    Review of Systems Constitutional: Negative for other unusual diaphoresis, sweats, appetite or weight changes HENT: Negative for other worsening hearing loss, ear pain, facial swelling, mouth sores or neck stiffness.   Eyes: Negative for other worsening pain, redness or other visual disturbance.  Respiratory: Negative for other stridor or swelling Cardiovascular: Negative for other palpitations or other chest pain  Gastrointestinal: Negative for worsening diarrhea or loose stools, blood in stool, distention or other pain Genitourinary: Negative for hematuria, flank  pain or other change in urine volume.  Musculoskeletal: Negative for myalgias or other joint swelling.  Skin: Negative for other color change, or other wound or worsening drainage.  Neurological: Negative  for other syncope or numbness. Hematological: Negative for other adenopathy or swelling Psychiatric/Behavioral: Negative for hallucinations, other worsening agitation, SI, self-injury, or new decreased concentration All other system neg per pt    Objective:   Physical Exam BP 118/78   Pulse 60   Temp 97.8 F (36.6 C) (Oral)   Ht '5\' 6"'$  (1.676 m)   Wt 297 lb (134.7 kg)   SpO2 96%   BMI 47.94 kg/m  VS noted, morbid obese Constitutional: Pt is oriented to person, place, and time. Appears well-developed and well-nourished, in no significant distress and comfortable Head: Normocephalic and atraumatic  Eyes: Conjunctivae and EOM are normal. Pupils are equal, round, and reactive to light Right Ear: External ear normal without discharge Left Ear: External ear normal without discharge Nose: Nose without discharge or deformity Mouth/Throat: Oropharynx is without other ulcerations and moist  Neck: Normal range of motion. Neck supple. No JVD present. No tracheal deviation present or significant neck LA or mass Cardiovascular: Normal rate, regular rhythm, normal heart sounds and intact distal pulses.   Pulmonary/Chest: WOB normal and breath sounds without rales or wheezing  Abdominal: Soft. Bowel sounds are normal. NT. No HSM  Musculoskeletal: Normal range of motion. Exhibits no edema Lymphadenopathy: Has no other cervical adenopathy.  Neurological: Pt is alert and oriented to person, place, and time. Pt has normal reflexes. No cranial nerve deficit. Motor grossly intact, Gait intact Skin: Skin is warm and dry. No rash noted or new ulcerations Psychiatric:  Has nervous mood and affect. Behavior is normal without agitation No other exam findings  Lab Results  Component Value Date   WBC 7.0  09/18/2017   HGB 13.5 09/18/2017   HCT 40.6 09/18/2017   PLT 175.0 09/18/2017   GLUCOSE 107 (H) 09/15/2017   CHOL 152 09/18/2017   TRIG 174.0 (H) 09/18/2017   HDL 42.00 09/18/2017   LDLDIRECT 107.0 09/17/2016   LDLCALC 75 09/18/2017   ALT 12 09/18/2017   AST 19 09/18/2017   NA 142 09/15/2017   K 3.2 (L) 09/15/2017   CL 102 09/15/2017   CREATININE 0.80 09/15/2017   BUN 15 09/15/2017   CO2 28 05/28/2017   TSH 3.14 09/18/2017   HGBA1C 6.3 09/18/2017       Assessment & Plan:

## 2018-03-23 ENCOUNTER — Other Ambulatory Visit: Payer: Self-pay | Admitting: Internal Medicine

## 2018-03-26 DIAGNOSIS — H40013 Open angle with borderline findings, low risk, bilateral: Secondary | ICD-10-CM | POA: Diagnosis not present

## 2018-03-26 DIAGNOSIS — I7090 Unspecified atherosclerosis: Secondary | ICD-10-CM | POA: Diagnosis not present

## 2018-03-26 DIAGNOSIS — D3131 Benign neoplasm of right choroid: Secondary | ICD-10-CM | POA: Diagnosis not present

## 2018-03-26 DIAGNOSIS — H318 Other specified disorders of choroid: Secondary | ICD-10-CM | POA: Diagnosis not present

## 2018-03-30 ENCOUNTER — Other Ambulatory Visit: Payer: Self-pay | Admitting: *Deleted

## 2018-03-30 MED ORDER — ROSUVASTATIN CALCIUM 10 MG PO TABS: ORAL_TABLET | ORAL | 3 refills | Status: DC

## 2018-04-06 ENCOUNTER — Encounter: Payer: Self-pay | Admitting: Emergency Medicine

## 2018-04-06 ENCOUNTER — Ambulatory Visit: Payer: Medicare Other | Admitting: Emergency Medicine

## 2018-04-06 DIAGNOSIS — G4733 Obstructive sleep apnea (adult) (pediatric): Secondary | ICD-10-CM | POA: Diagnosis not present

## 2018-04-06 DIAGNOSIS — J45909 Unspecified asthma, uncomplicated: Secondary | ICD-10-CM | POA: Diagnosis not present

## 2018-04-06 DIAGNOSIS — J301 Allergic rhinitis due to pollen: Secondary | ICD-10-CM | POA: Diagnosis not present

## 2018-04-06 NOTE — Assessment & Plan Note (Signed)
Great compliance and good clinical benefit.  She follows with Dr. Isidoro Donning.

## 2018-04-06 NOTE — Assessment & Plan Note (Signed)
Significant benefit in her overall breathing, albuterol use with discontinuation of her atenolol at the beginning of this year.  She was able to stop her Symbicort and has tolerated.  Her albuterol use is very rare.  She is on Singulair and Xyzal for allergy control and will continue these.  Flu shot and pneumonia shots are both up-to-date.  Plan to follow-up with her annually or sooner if she has any problems

## 2018-04-06 NOTE — Patient Instructions (Signed)
Agree with staying off Symbicort for now. Please keep your albuterol available to use 2 puffs up to every 4 hours if you needed for shortness of breath, chest tightness, wheezing. Please continue your montelukast (Singulair) 10 mg each evening. Please continue Xyzal as you are taking it. Please continue your CPAP every night.  Follow-up with Dr. Isidoro Donning as planned. Follow with Dr. Lamonte Sakai in 12 months or sooner if you have any problems.

## 2018-04-06 NOTE — Progress Notes (Signed)
Subjective:    Patient ID: [name], female    DOB: [birth year], 82 y.o.   MRN: [id number]  Asthma  She complains of cough, shortness of breath and wheezing. Pertinent negatives include no ear pain, fever, headaches, postnasal drip, rhinorrhea, sneezing, sore throat or trouble swallowing. Her past medical history is significant for asthma.   82 year old woman, former smoker (4 pack years), with a history of breast cancer post lumpectomy, radiation therapy, and letrozole 5 yrs. Also with a history of coronary disease, atrial fibrillation, OSA w good compliance CPAP, HTN, allergic rhinitis. She also carries a history of asthma that was made by Dr Isidoro Donning over 10 yrs ago when she experienced a possible aspiration event, experienced dyspnea, SOB. She reports that she has seasonal symptoms that are similar, usually Spring and Fall. Started as rhiinitis, evolved to dyspnea and some wheeze. Has typically been treated with pred tapers when she flared. She was formerly on Advair, changed to Symbicort this Spring. She believes that the BD's help her. Also on Singulair for several yrs. Also on allegra and xyzal, flonase. Last prednisone was early July. Sometimes a flare also includes cough - not a daily problems. Has albuterol, uses periodically, feels that she benefits. No overt GERD sx. No new exposures or meds. Minimal cough currently. She has had persistent nasal congestion, drainage.   She continues to have wheeze and dyspnea after recent Pred. Prompted this visit.   A chest x-ray from 10/03/16 was reviewed. This showed cardiomegaly, mild pulmonary vascular congestion without overt pulmonary edema, no infiltrates, no nodules.  ROV 02/06/17 -- This follow-up visit for patient with a history of breast cancer, coronary disease, CAD, atrial fibrillation with some degree diastolic dysfunction, obstructive sleep apnea on CPAP, allergic rhinitis. Also carries a history of suspected asthma. She underwent pulmonary  function testing today that I have personally reviewed. This shows spirometry with mixed obstruction and restriction and a mild curve to her flow volume loop. FEV1 75% predicted. Lung volumes were normal. Diffusion capacity corrected to the normal range for alveolar volume. Currently managed on Symbicort and albuterol as needed. She restarted nasal saline - has helped her. Also added astelin to her allergy regimen. Saw Dr Remus Blake, had skin testing. Reports that her cough and breathing are better than last time.   ROV 04/06/18 --[name] is 82 years old, has a history of breast cancer, coronary disease, atrial fibrillation with some associated diastolic dysfunction.  She also is on CPAP for obstructive sleep apnea, has allergic rhinitis.  She is followed here for that and also for mild intermittent asthma with mixed obstruction and restriction by pulmonary function testing.  She is here for annual follow-up.  She reports that since last time she has been wearing her CPAP reliably, good repair, good clinical benefit. She stopped atenolol about a year ago - has tolerated well, in fact her breathing is better. She is still dealing with allergies - is still on Singulair, xyzal. She uses astelin prn. She was able to stop Symbicort and has been off since early 2019. Very rare albuterol use. She has not required pred or abx in the last yr. Fu shot up to date, PNA shots up to date.    Review of Systems  Constitutional: Negative for fever and unexpected weight change.  HENT: Negative for congestion, dental problem, ear pain, nosebleeds, postnasal drip, rhinorrhea, sinus pressure, sneezing, sore throat and trouble swallowing.   Eyes: Negative for redness and itching.  Respiratory: Positive for  cough, chest tightness, shortness of breath and wheezing.   Cardiovascular: Negative for palpitations and leg swelling.  Gastrointestinal: Negative for nausea and vomiting.  Genitourinary: Negative for dysuria.    Musculoskeletal: Negative for joint swelling.  Skin: Negative for rash.  Neurological: Negative for headaches.  Hematological: Does not bruise/bleed easily.  Psychiatric/Behavioral: Negative for dysphoric mood. The patient is not nervous/anxious.     Past Medical History:  Diagnosis Date  . Allergy   . Anxiety   . Arthritis    no cartlidge in knees  . Asthma   . Bradycardia    a. atenolol stopped due to this, HR 40s.  . 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 CAD in native artery    a.  CAD s/p RCA stent 2000 with residual mild-mod disease of left system 2001.  Marland Kitchen Chronic atrial fibrillation   . COPD (chronic obstructive pulmonary disease) ([place])   . Essential hypertension 03/24/2015  . Gout    post operative  . Hx of radiation therapy 04/02/11 -05/20/11   left breast  . Hyperlipidemia   . Hypothyroidism 03/24/2015  . Incontinence of urine   . Malignant neoplasm of upper-outer quadrant of female breast ([place]) 12/26/2010  . Osteoporosis 03/24/2015  . Peripheral neuropathy 03/24/2015  . Plantar fasciitis   . Sleep apnea      Family History  Problem Relation Age of Onset  . Heart disease Father   . Cancer Father        intestinal polyps  . Heart attack Father   . Asthma Sister   . Heart disease Brother   . Heart attack Brother      Social History   Socioeconomic History  . Marital status: Married    Spouse name: Not on file  . Number of children: Not on file  . Years of education: Not on file  . Highest education level: Not on file  Occupational History  . Not on file  Social Needs  . Financial resource strain: Not on file  . Food insecurity:    Worry: Not on file    Inability: Not on file  . Transportation needs:    Medical: Not on file    Non-medical: Not on file  Tobacco Use  . Smoking status: Former Smoker    Packs/day: 1.00    Years: 4.00    Pack years: 4.00    Last attempt to quit: 12/05/1962    Years since quitting: 55.3  .  Smokeless tobacco: Never Used  Substance and Sexual Activity  . Alcohol use: No  . Drug use: No  . Sexual activity: Not on file  Lifestyle  . Physical activity:    Days per week: Not on file    Minutes per session: Not on file  . Stress: Not on file  Relationships  . Social connections:    Talks on phone: Not on file    Gets together: Not on file    Attends religious service: Not on file    Active member of club or organization: Not on file    Attends meetings of clubs or organizations: Not on file    Relationship status: Not on file  . Intimate partner violence:    Fear of current or ex partner: Not on file    Emotionally abused: Not on file    Physically abused: Not on file    Forced sexual activity: Not on file  Other Topics Concern  . Not  on file  Social History Narrative  . Not on file  No smoker exposure.  No occupational exposures  Allergies  Allergen Reactions  . Crestor [Rosuvastatin Calcium]     Severe liver function problems  . Hydrocodone-Acetaminophen Anxiety and Itching  . Lidocaine Hcl Other (See Comments)    Novacaine:  Becomes very shaky  . Lipitor [Atorvastatin Calcium] Other (See Comments)    Elevated liver function   . Procaine Other (See Comments)    shaking  . Rosuvastatin Other (See Comments)    Severe liver function problems  . Theophylline Other (See Comments)    Becomes very shaky skaking skaking  . Vicodin [Hydrocodone-Acetaminophen] Itching    Itching all over  . Codeine Nausea Only, Anxiety and Other (See Comments)    Also complained of dizziness.  Has same problems with oxycodone and hydrocodone Visual Disturbance, Balance Difficulty, Dizzy "Room Spinning; "Swimming" Balance, vision, nausea, dizzy, "swimming", "room spinning" Balance, vision, nausea, dizzy, "swimming", "room spinning"  . Oxycodone-Acetaminophen Nausea Only, Anxiety and Other (See Comments)    Visual Disturbance, Balance Difficulty, Dizzy "Room Spinning; "Swimming"    . Propoxyphene Anxiety, Nausea Only and Other (See Comments)    Visual Disturbance, Balance Difficulty, Dizzy "Room Spinning; "Swimming" Balance, vision, nausea, dizzy, "swimming", "room spinning" Balance, vision, nausea, dizzy, "swimming, "room spinning"  . Quinine Derivatives Nausea Only    dizzy  . Sulfa Antibiotics Nausea Only    Also complained of dizziness  . Atorvastatin Other (See Comments)    Severe liver function problems  . Ephedrine Other (See Comments)    shaking  . Other Other (See Comments)    Quinine causes nausea, dizzy  . Oxycodone Other (See Comments)    Balance, vision, nausea, dizzy, "swimming", "room spinning"  . Cephalexin Rash  . Epinephrine Other (See Comments)    Patient becomes "shaky" shaking shaking  . Penicillins Rash    Pt reported all over body rash in 1958 Has patient had a PCN reaction causing immediate rash, facial/tongue/throat swelling, SOB or lightheadedness with hypotension:Yes Has patient had a PCN reaction causing severe rash involving mucus membranes or skin necrosis: No Has patient had a PCN reaction that required hospitalization: No Has patient had a PCN reaction occurring within the last 10 years:No    . Theophyllines Other (See Comments)    Patient becomes "shaky"     Outpatient Medications Prior to Visit  Medication Sig Dispense Refill  . albuterol (PROVENTIL) (2.5 MG/3ML) 0.083% nebulizer solution Take 3 mLs (2.5 mg total) by nebulization every 6 (six) hours as needed for wheezing or shortness of breath. 75 mL 12  . allopurinol (ZYLOPRIM) 300 MG tablet Take 300 mg by mouth daily.  1  . ALPRAZolam (XANAX) 0.5 MG tablet TAKE 1 TO 2 TABLETS BY MOUTH TWICE DAILY IF NEEDED FOR ANXIETY/SLEEP 60 tablet 3  . azelastine (ASTELIN) 0.1 % nasal spray Place 2 sprays into both nostrils daily. Use in each nostril as directed    . azelastine (OPTIVAR) 0.05 % ophthalmic solution Place 1 drop into both eyes 2 (two) times daily. (Patient taking  differently: Place 1 drop into both eyes 2 (two) times daily as needed (allergies and dry eyes). ) 6 mL 12  . B Complex Vitamins (VITAMIN B COMPLEX PO) Take 1 tablet by mouth daily.    . Biotin 5000 MCG CAPS Take 5,000 mcg by mouth daily.     . cholecalciferol (VITAMIN D) 1000 UNITS tablet Take 1,000 Units by mouth daily.    Marland Kitchen  co-enzyme Q-10 50 MG capsule Take 400 mg by mouth daily.    . colchicine (COLCRYS) 0.6 MG tablet Take 0.6 mg by mouth daily as needed (flare  up).     . Cyanocobalamin (VITAMIN B-12 PO) Take 1 tablet by mouth daily.    Marland Kitchen doxycycline (VIBRA-TABS) 100 MG tablet Take 1 tablet (100 mg total) by mouth 2 (two) times daily. 20 tablet 1  . escitalopram (LEXAPRO) 10 MG tablet Take 1 tablet (10 mg total) by mouth daily. 90 tablet 2  . Evolocumab with Infusor ([place]) 420 MG/3.5ML SOCT Inject 1 Units into the skin every 30 (thirty) days.    Marland Kitchen ezetimibe (ZETIA) 10 MG tablet Take 1/2 to 1 tablet by mouth daily as directed 90 tablet 0  . gabapentin (NEURONTIN) 100 MG capsule TAKE 1 CAPSULE(100 MG) BY MOUTH DAILY 30 capsule 2  . hydrochlorothiazide (HYDRODIURIL) 25 MG tablet Take 2 tablets (50 mg total) by mouth daily. 180 tablet 2  . levocetirizine (XYZAL) 5 MG tablet Take 1 tablet (5 mg total) by mouth at bedtime. 90 tablet 2  . levothyroxine (SYNTHROID, LEVOTHROID) 75 MCG tablet Take 1 tablet (75 mcg total) by mouth daily. 90 tablet 2  . metoprolol succinate (TOPROL-XL) 25 MG 24 hr tablet Take 0.5 tablets (12.5 mg total) by mouth daily. Please make an appt with Dr. Irish Lack for December for future refills. 1st attempt. 45 tablet 1  . montelukast (SINGULAIR) 10 MG tablet TAKE 1 TABLET BY MOUTH AT BEDTIME 90 tablet 0  . mupirocin ointment (BACTROBAN) 2 % Use twice a day as directed 30 g 1  . nystatin cream (MYCOSTATIN) Apply 1 application topically daily as needed for dry skin.   0  . potassium chloride (K-DUR) 10 MEQ tablet Take 3 tablets (30 mEq total) by mouth daily.  270 tablet 2  . potassium chloride SA (K-DUR,KLOR-CON) 20 MEQ tablet TAKE 1 TABLET BY MOUTH EVERY DAY 90 tablet 0  . PROAIR HFA 108 (90 Base) MCG/ACT inhaler INHALE 2 PUFFS BY MOUTH INTO THE LUNGS IF NEEDED FOR WHEEZING OR SHORTNESS OF BREATH 8.5 g 2  . Pyridoxine HCl (VITAMIN B-6 PO) Take 1 tablet by mouth daily.    Marland Kitchen RA ALLERGY RELIEF 180 MG tablet take 1 tablet by mouth once daily 30 tablet 5  . rivaroxaban (XARELTO) 20 MG TABS tablet Take 1 tablet (20 mg total) by mouth daily with supper. 90 tablet 1  . rosuvastatin (CRESTOR) 10 MG tablet Take 1 tablet by mouth twice a week. 27 tablet 3  . Saccharomyces boulardii (FLORASTOR PO) Take 2 capsules by mouth daily.    . traMADol-acetaminophen (ULTRACET) 37.5-325 MG tablet Take 1-2 tablets by mouth every 6 (six) hours as needed for moderate pain or severe pain.   0  . triamcinolone (NASACORT AQ) 55 MCG/ACT AERO nasal inhaler Place 2 sprays daily into the nose. 1 Inhaler 12  . triamcinolone cream (KENALOG) 0.1 % Apply 1 application topically 2 (two) times daily. For 2 weeks, then as needed 80 g 2  . vitamin C (ASCORBIC ACID) 500 MG tablet Take 500 mg by mouth daily.    Marland Kitchen VITAMIN E PO Take 1 tablet by mouth daily.    . budesonide-formoterol (SYMBICORT) 160-4.5 MCG/ACT inhaler Inhale 2 puffs 2 (two) times daily into the lungs.     No facility-administered medications prior to visit.         Objective:   Physical Exam Vitals:   04/06/18 1341  BP: 116/82  Pulse: (!) 52  SpO2: 98%  Weight: 297 lb (134.7 kg)  Height: '5\' 6"'$  (1.676 m)   Gen: Pleasant, Obese woman, in no distress,  normal affect  ENT: No lesions,  mouth clear,  oropharynx clear, no active nasal drip  Neck: No JVD, mild intermittent expiratory stridor  Lungs: No use of accessory muscles, no apparent wheeze.   Cardiovascular: Irregular, distant  Musculoskeletal: No deformities, no cyanosis or clubbing  Neuro: alert, non focal  Skin: Warm, no lesions or rashes        Assessment & Plan:  Asthma Significant benefit in her overall breathing, albuterol use with discontinuation of her atenolol at the beginning of this year.  She was able to stop her Symbicort and has tolerated.  Her albuterol use is very rare.  She is on Singulair and Xyzal for allergy control and will continue these.  Flu shot and pneumonia shots are both up-to-date.  Plan to follow-up with her annually or sooner if she has any problems  OSA (obstructive sleep apnea) Great compliance and good clinical benefit.  She follows with Dr. Isidoro Donning.   Allergic rhinitis Follows with Dr. Remus Blake.  Continue Singulair, Xyzal.  She has Astelin nasal spray to use as needed.  Requires it rarely.   Baltazar Apo, MD, PhD 04/06/2018, 2:03 PM [facility] [phone] or if no answer [phone]

## 2018-04-06 NOTE — Assessment & Plan Note (Signed)
Follows with Dr. Remus Blake.  Continue Singulair, Xyzal.  She has Astelin nasal spray to use as needed.  Requires it rarely.

## 2018-04-09 ENCOUNTER — Telehealth: Payer: Self-pay | Admitting: Pharmacist

## 2018-04-09 DIAGNOSIS — G4733 Obstructive sleep apnea (adult) (pediatric): Secondary | ICD-10-CM | POA: Diagnosis not present

## 2018-04-09 NOTE — Telephone Encounter (Signed)
LMOM - pt will need to fill out Safety Net Application for 8280 year to continue to receive Repatha for free.

## 2018-04-11 ENCOUNTER — Other Ambulatory Visit: Payer: Self-pay | Admitting: Internal Medicine

## 2018-04-13 ENCOUNTER — Other Ambulatory Visit: Payer: Self-pay

## 2018-04-13 MED ORDER — LEVOTHYROXINE SODIUM 75 MCG PO TABS: 75.0000 ug | ORAL_TABLET | Freq: Every day | ORAL | 1 refills | Status: DC

## 2018-04-20 NOTE — Telephone Encounter (Signed)
Spoke with pt - will mail out SNF application for 2440. PA already approved on file through 05/06/19.

## 2018-05-04 ENCOUNTER — Other Ambulatory Visit: Payer: Self-pay | Admitting: Internal Medicine

## 2018-05-05 ENCOUNTER — Other Ambulatory Visit: Payer: Self-pay | Admitting: Internal Medicine

## 2018-05-12 ENCOUNTER — Other Ambulatory Visit: Payer: Self-pay | Admitting: Physician Assistant

## 2018-05-18 ENCOUNTER — Other Ambulatory Visit: Payer: Self-pay | Admitting: Interventional Cardiology

## 2018-05-18 MED ORDER — EZETIMIBE 10 MG PO TABS: ORAL_TABLET | ORAL | 2 refills | Status: DC

## 2018-05-22 ENCOUNTER — Telehealth: Payer: Self-pay | Admitting: Internal Medicine

## 2018-05-22 MED ORDER — ALPRAZOLAM 0.5 MG PO TABS: ORAL_TABLET | ORAL | 3 refills | Status: DC

## 2018-05-22 NOTE — Telephone Encounter (Signed)
Done erx 

## 2018-05-26 ENCOUNTER — Telehealth: Payer: Self-pay | Admitting: Pharmacist

## 2018-05-26 MED ORDER — EVOLOCUMAB WITH INFUSOR 420 MG/3.5ML ~~LOC~~ SOCT: 1.0000 [IU] | SUBCUTANEOUS | 11 refills | Status: DC

## 2018-05-26 NOTE — Telephone Encounter (Signed)
Spoke with pt - there is limited funding with Repatha through [employer] this year. Copay at the pharmacy is $100 per month which pt is ok with. Rx sent to local pharmacy.

## 2018-06-11 DIAGNOSIS — H35373 Puckering of macula, bilateral: Secondary | ICD-10-CM | POA: Diagnosis not present

## 2018-06-11 DIAGNOSIS — H43813 Vitreous degeneration, bilateral: Secondary | ICD-10-CM | POA: Diagnosis not present

## 2018-06-11 DIAGNOSIS — H3091 Unspecified chorioretinal inflammation, right eye: Secondary | ICD-10-CM | POA: Diagnosis not present

## 2018-06-11 DIAGNOSIS — D3131 Benign neoplasm of right choroid: Secondary | ICD-10-CM | POA: Diagnosis not present

## 2018-06-13 ENCOUNTER — Other Ambulatory Visit: Payer: Self-pay | Admitting: Internal Medicine

## 2018-06-22 ENCOUNTER — Telehealth: Payer: Self-pay | Admitting: *Deleted

## 2018-06-22 NOTE — Telephone Encounter (Signed)
Patient with diagnosis of atrial fibrillation on Xarelto for anticoagulation.    Procedure: dental extraction, single tooth Date of procedure: TBD  CHADS2-VASc score of  5 (CHF, HTN, AGE, DM2, stroke/tia x 2, CAD, AGE, female)  CrCl 119.3 Platelet count 175  We do not recommend holding anticoagulation for the extraction of 1-2 teeth.  She can continue her Xarelto.

## 2018-06-22 NOTE — Telephone Encounter (Signed)
   [facility] Pre-operative Risk Assessment    Request for surgical clearance:  1. What type of surgery is being performed? EXTRACTION OF AN UPPER TOOTH ON THE RIGHT   2. When is this surgery scheduled? TBD   3. What type of clearance is required (medical clearance vs. Pharmacy clearance to hold med vs. Both)? BOTH  4. Are there any medications that need to be held prior to surgery and how long?Cache   5. Practice name and name of physician performing surgery? [facility]; DR. Jeneen Rinks BELCHER   6. What is your office phone number [phone]    7.   What is your office fax number [phone]  8.   Anesthesia type (None, local, MAC, general) ? LOCAL   Julaine Hua 06/22/2018, 10:08 AM  _________________________________________________________________   (provider comments below)

## 2018-07-13 ENCOUNTER — Other Ambulatory Visit: Payer: Self-pay | Admitting: Internal Medicine

## 2018-07-13 DIAGNOSIS — M25522 Pain in left elbow: Secondary | ICD-10-CM | POA: Diagnosis not present

## 2018-07-13 DIAGNOSIS — M1711 Unilateral primary osteoarthritis, right knee: Secondary | ICD-10-CM | POA: Diagnosis not present

## 2018-07-13 DIAGNOSIS — M1712 Unilateral primary osteoarthritis, left knee: Secondary | ICD-10-CM | POA: Diagnosis not present

## 2018-07-13 DIAGNOSIS — M47812 Spondylosis without myelopathy or radiculopathy, cervical region: Secondary | ICD-10-CM | POA: Diagnosis not present

## 2018-07-20 ENCOUNTER — Other Ambulatory Visit: Payer: Self-pay

## 2018-07-20 MED ORDER — FEXOFENADINE HCL 180 MG PO TABS: 180.0000 mg | ORAL_TABLET | Freq: Every day | ORAL | 5 refills | Status: DC

## 2018-09-01 ENCOUNTER — Other Ambulatory Visit: Payer: Self-pay | Admitting: Internal Medicine

## 2018-09-01 MED ORDER — ALPRAZOLAM 0.5 MG PO TABS: ORAL_TABLET | ORAL | 3 refills | Status: DC

## 2018-09-04 ENCOUNTER — Other Ambulatory Visit: Payer: Self-pay | Admitting: Interventional Cardiology

## 2018-09-04 NOTE — Telephone Encounter (Signed)
Xarelto 20mg  refill request received; pt is 83yrs old, wt-134.7kg, Crea-0.86 on 12/11/2017 via [facility] at [facility], last seen by Dr. Irish Lack on 02/27/2018, CrCl-109.35ml/min; refill sent.

## 2018-09-11 ENCOUNTER — Telehealth: Payer: Self-pay | Admitting: Internal Medicine

## 2018-09-11 MED ORDER — GABAPENTIN 100 MG PO CAPS: ORAL_CAPSULE | ORAL | 2 refills | Status: DC

## 2018-09-11 NOTE — Telephone Encounter (Signed)
Copied from [place] [id number]. Topic: Quick Communication - Rx Refill/Question >> Sep 11, 2018 12:20 PM Blase Mess A wrote: Medication: gabapentin (NEURONTIN) 100 MG capsule [[id number]]   Has the patient contacted their pharmacy? Yes  (Agent: If no, request that the patient contact the pharmacy for the refill.) (Agent: If yes, when and what did the pharmacy advise?)  Preferred Pharmacy (with phone number or street name):  [id number] - [place], [place] [address] AT [phone] (Phone) [phone] (Fax)     Agent: Please be advised that RX refills may take up to 3 business days. We ask that you follow-up with your pharmacy.

## 2018-09-14 ENCOUNTER — Telehealth: Payer: Self-pay | Admitting: Internal Medicine

## 2018-09-14 MED ORDER — POTASSIUM CHLORIDE CRYS ER 20 MEQ PO TBCR: 20.0000 meq | EXTENDED_RELEASE_TABLET | Freq: Every day | ORAL | 0 refills | Status: DC

## 2018-09-14 NOTE — Telephone Encounter (Signed)
Refill sent. See meds.  

## 2018-09-14 NOTE — Telephone Encounter (Signed)
Copied from [place]. Topic: Quick Communication - Rx Refill/Question >> Sep 14, 2018  1:52 PM Richardo Priest, [place] wrote: Medication:  potassium chloride (K-DUR) 10 MEQ tablet  Has the patient contacted their pharmacy? Yes, pharmacy called to ask for request.   Preferred Pharmacy (with phone number or street name):  [id number] - [place], [place] [address] AT [place] [phone] (Phone) [phone] (Fax)  Agent: Please be advised that RX refills may take up to 3 business days. We ask that you follow-up with your pharmacy.

## 2018-09-17 ENCOUNTER — Ambulatory Visit (INDEPENDENT_AMBULATORY_CARE_PROVIDER_SITE_OTHER): Payer: Medicare Other | Admitting: Internal Medicine

## 2018-09-17 DIAGNOSIS — E559 Vitamin D deficiency, unspecified: Secondary | ICD-10-CM

## 2018-09-17 DIAGNOSIS — E538 Deficiency of other specified B group vitamins: Secondary | ICD-10-CM | POA: Diagnosis not present

## 2018-09-17 DIAGNOSIS — F419 Anxiety disorder, unspecified: Secondary | ICD-10-CM

## 2018-09-17 DIAGNOSIS — E039 Hypothyroidism, unspecified: Secondary | ICD-10-CM

## 2018-09-17 DIAGNOSIS — G6289 Other specified polyneuropathies: Secondary | ICD-10-CM

## 2018-09-17 DIAGNOSIS — E611 Iron deficiency: Secondary | ICD-10-CM | POA: Diagnosis not present

## 2018-09-17 DIAGNOSIS — Z0000 Encounter for general adult medical examination without abnormal findings: Secondary | ICD-10-CM

## 2018-09-17 DIAGNOSIS — F329 Major depressive disorder, single episode, unspecified: Secondary | ICD-10-CM

## 2018-09-17 DIAGNOSIS — M1990 Unspecified osteoarthritis, unspecified site: Secondary | ICD-10-CM | POA: Diagnosis not present

## 2018-09-17 DIAGNOSIS — R739 Hyperglycemia, unspecified: Secondary | ICD-10-CM | POA: Diagnosis not present

## 2018-09-17 DIAGNOSIS — Z0001 Encounter for general adult medical examination with abnormal findings: Secondary | ICD-10-CM | POA: Diagnosis not present

## 2018-09-17 DIAGNOSIS — F32A Depression, unspecified: Secondary | ICD-10-CM

## 2018-09-17 MED ORDER — DICLOFENAC SODIUM 1 % TD GEL: 4.0000 g | Freq: Four times a day (QID) | TRANSDERMAL | 11 refills | Status: DC | PRN

## 2018-09-17 MED ORDER — GABAPENTIN 300 MG PO CAPS: 300.0000 mg | ORAL_CAPSULE | Freq: Three times a day (TID) | ORAL | 1 refills | Status: DC

## 2018-09-17 MED ORDER — ALBUTEROL SULFATE HFA 108 (90 BASE) MCG/ACT IN AERS: INHALATION_SPRAY | RESPIRATORY_TRACT | 2 refills | Status: DC

## 2018-09-17 NOTE — Patient Instructions (Addendum)
Please take all new medication as prescribed - the voltaren gel as needed for pain  OK to increase the gabapentin to 300 mg at bedtime  Please continue all other medications as before, and refills have been done if requested.  Please have the pharmacy call with any other refills you may need.  Please continue your efforts at being more active, low cholesterol diet, and weight control.  You are otherwise up to date with prevention measures today.  Please keep your appointments with your specialists as you may have planned  Please go to the LAB in the Basement (turn left off the elevator) for the tests to be done   You will be contacted by phone if any changes need to be made immediately.  Otherwise, you will receive a letter about your results with an explanation, but please check with MyChart first.  Please remember to sign up for MyChart if you have not done so, as this will be important to you in the future with finding out test results, communicating by private email, and scheduling acute appointments online when needed.  Please return in 6 months, or sooner if needed

## 2018-09-17 NOTE — Progress Notes (Signed)
Patient ID: [name], female   DOB: 12-09-1935, 83 y.o.   MRN: [id number]  Virtual Visit via Video Note by phone only due to video failure I connected with [name] on 09/17/18 at  9:40 AM EDT by a video enabled telemedicine application and verified that I am speaking with the correct person using two identifiers.  Location: Patient: at home Provider: at office   I discussed the limitations of evaluation and management by telemedicine and the availability of in person appointments. The patient expressed understanding and agreed to proceed.  History of Present Illness: Here for wellness and f/u;  Overall doing ok;  Pt denies Chest pain, worsening SOB, DOE, wheezing, orthopnea, PND, worsening LE edema, palpitations, dizziness or syncope.  Pt denies neurological change such as new headache, facial or extremity weakness.  Pt denies polydipsia, polyuria, or low sugar symptoms. Pt states overall good compliance with treatment and medications, good tolerability, and has been trying to follow appropriate diet.  Pt denies worsening depressive symptoms, suicidal ideation or panic. No fever, night sweats, wt loss, loss of appetite, or other constitutional symptoms.  Pt states good ability with ADL's, has low fall risk, home safety reviewed and adequate, no other significant changes in hearing or vision, and only occasionally active with exercise. Also c/o increased pain to bilat knees and hands in last 2 mo without swelling, giveaways or falls.  Current pain about 6/10, tylenol not working, dull and sharp, worse to walk and use hands, better to sit Also c/o worsening uncontrolled bilat distal LE neuritic pain at night, hard to sleep, current gabapentin 100 qhs helps some, just not well enough for several months Past Medical History:  Diagnosis Date  . Allergy   . Anxiety   . Arthritis    no cartlidge in knees  . Asthma   . Bradycardia    a. atenolol stopped due to this, HR 40s.  . 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 CAD in native artery    a.  CAD s/p RCA stent 2000 with residual mild-mod disease of left system 2001.  Marland Kitchen Chronic atrial fibrillation   . COPD (chronic obstructive pulmonary disease) ([place])   . Essential hypertension 03/24/2015  . Gout    post operative  . Hx of radiation therapy 04/02/11 -05/20/11   left breast  . Hyperlipidemia   . Hypothyroidism 03/24/2015  . Incontinence of urine   . Malignant neoplasm of upper-outer quadrant of female breast ([place]) 12/26/2010  . Osteoporosis 03/24/2015  . Peripheral neuropathy 03/24/2015  . Plantar fasciitis   . Sleep apnea    Past Surgical History:  Procedure Laterality Date  . BREAST LUMPECTOMY W/ NEEDLE LOCALIZATION  01/29/2011   Left with [facility] Dr Margot Chimes  . CHOLECYSTECTOMY  1955  . CORONARY ANGIOPLASTY WITH STENT PLACEMENT  2000  . DILATION AND CURETTAGE OF UTERUS  1976   and 1996  . HAND SURGERY  1992   tendons between thumb and forefinger  . SKIN BIOPSY     1994, 2006, 2008, 2010, 2011, 2011, 2012-  pre-cancerous  . TONSILLECTOMY  1955    reports that she quit smoking about 55 years ago. She has a 4.00 pack-year smoking history. She has never used smokeless tobacco. She reports that she does not drink alcohol or use drugs. family history includes Asthma in her sister; Cancer in her father; Heart attack in her brother and father; Heart disease in her brother and father.  Allergies  Allergen Reactions  . Crestor [Rosuvastatin Calcium]     Severe liver function problems  . Hydrocodone-Acetaminophen Anxiety and Itching  . Lidocaine Hcl Other (See Comments)    Novacaine:  Becomes very shaky  . Lipitor [Atorvastatin Calcium] Other (See Comments)    Elevated liver function   . Procaine Other (See Comments)    shaking  . Rosuvastatin Other (See Comments)    Severe liver function problems  . Theophylline Other (See Comments)    Becomes very shaky skaking skaking  . Vicodin  [Hydrocodone-Acetaminophen] Itching    Itching all over  . Codeine Nausea Only, Anxiety and Other (See Comments)    Also complained of dizziness.  Has same problems with oxycodone and hydrocodone Visual Disturbance, Balance Difficulty, Dizzy "Room Spinning; "Swimming" Balance, vision, nausea, dizzy, "swimming", "room spinning" Balance, vision, nausea, dizzy, "swimming", "room spinning"  . Oxycodone-Acetaminophen Nausea Only, Anxiety and Other (See Comments)    Visual Disturbance, Balance Difficulty, Dizzy "Room Spinning; "Swimming"  . Propoxyphene Anxiety, Nausea Only and Other (See Comments)    Visual Disturbance, Balance Difficulty, Dizzy "Room Spinning; "Swimming" Balance, vision, nausea, dizzy, "swimming", "room spinning" Balance, vision, nausea, dizzy, "swimming, "room spinning"  . Quinine Derivatives Nausea Only    dizzy  . Sulfa Antibiotics Nausea Only    Also complained of dizziness  . Atorvastatin Other (See Comments)    Severe liver function problems  . Ephedrine Other (See Comments)    shaking  . Other Other (See Comments)    Quinine causes nausea, dizzy  . Oxycodone Other (See Comments)    Balance, vision, nausea, dizzy, "swimming", "room spinning"  . Cephalexin Rash  . Epinephrine Other (See Comments)    Patient becomes "shaky" shaking shaking  . Penicillins Rash    Pt reported all over body rash in 1958 Has patient had a PCN reaction causing immediate rash, facial/tongue/throat swelling, SOB or lightheadedness with hypotension:Yes Has patient had a PCN reaction causing severe rash involving mucus membranes or skin necrosis: No Has patient had a PCN reaction that required hospitalization: No Has patient had a PCN reaction occurring within the last 10 years:No    . Theophyllines Other (See Comments)    Patient becomes "shaky"   Current Outpatient Medications on File Prior to Visit  Medication Sig Dispense Refill  . albuterol (PROVENTIL) (2.5 MG/3ML) 0.083%  nebulizer solution Take 3 mLs (2.5 mg total) by nebulization every 6 (six) hours as needed for wheezing or shortness of breath. 75 mL 12  . allopurinol (ZYLOPRIM) 300 MG tablet Take 300 mg by mouth daily.  1  . ALPRAZolam (XANAX) 0.5 MG tablet TAKE 1 TO 2 TABLETS BY MOUTH TWICE DAILY IF NEEDED FOR ANXIETY/SLEEP 60 tablet 3  . azelastine (ASTELIN) 0.1 % nasal spray Place 2 sprays into both nostrils daily. Use in each nostril as directed    . azelastine (OPTIVAR) 0.05 % ophthalmic solution Place 1 drop into both eyes 2 (two) times daily. (Patient taking differently: Place 1 drop into both eyes 2 (two) times daily as needed (allergies and dry eyes). ) 6 mL 12  . B Complex Vitamins (VITAMIN B COMPLEX PO) Take 1 tablet by mouth daily.    . Biotin 5000 MCG CAPS Take 5,000 mcg by mouth daily.     . cholecalciferol (VITAMIN D) 1000 UNITS tablet Take 1,000 Units by mouth daily.    Marland Kitchen co-enzyme Q-10 50 MG capsule Take 400 mg by mouth daily.    . colchicine (COLCRYS) 0.6 MG  tablet Take 0.6 mg by mouth daily as needed (flare  up).     . Cyanocobalamin (VITAMIN B-12 PO) Take 1 tablet by mouth daily.    Marland Kitchen doxycycline (VIBRA-TABS) 100 MG tablet Take 1 tablet (100 mg total) by mouth 2 (two) times daily. 20 tablet 1  . escitalopram (LEXAPRO) 10 MG tablet TAKE 1 TABLET BY MOUTH EVERY DAY 90 tablet 1  . Evolocumab with Infusor ([place]) 420 MG/3.5ML SOCT Inject 1 Units into the skin every 30 (thirty) days. 1 Cartridge 11  . ezetimibe (ZETIA) 10 MG tablet Take 1/2 to 1 tablet by mouth daily as directed 90 tablet 2  . fexofenadine (RA ALLERGY RELIEF) 180 MG tablet Take 1 tablet (180 mg total) by mouth daily. 30 tablet 5  . hydrochlorothiazide (HYDRODIURIL) 25 MG tablet TAKE 2 TABLETS BY MOUTH ONCE DAILY 180 tablet 1  . levocetirizine (XYZAL) 5 MG tablet TAKE 1 TABLET(5 MG) BY MOUTH AT BEDTIME 30 tablet 5  . levothyroxine (SYNTHROID, LEVOTHROID) 75 MCG tablet Take 1 tablet (75 mcg total) by mouth daily.  90 tablet 1  . metoprolol succinate (TOPROL-XL) 25 MG 24 hr tablet TAKE 1/2 TABLET BY MOUTH DAILY. PLEASE MAKE AN APPT WITH VARANASI FOR DECEMBER FOR FUTURE REFILLS 45 tablet 2  . montelukast (SINGULAIR) 10 MG tablet TAKE 1 TABLET BY MOUTH AT BEDTIME 90 tablet 1  . mupirocin ointment (BACTROBAN) 2 % Use twice a day as directed 30 g 1  . nystatin cream (MYCOSTATIN) Apply 1 application topically daily as needed for dry skin.   0  . potassium chloride (K-DUR) 10 MEQ tablet Take 3 tablets (30 mEq total) by mouth daily. 270 tablet 2  . potassium chloride SA (K-DUR) 20 MEQ tablet Take 1 tablet (20 mEq total) by mouth daily. 90 tablet 0  . Pyridoxine HCl (VITAMIN B-6 PO) Take 1 tablet by mouth daily.    . rosuvastatin (CRESTOR) 10 MG tablet Take 1 tablet by mouth twice a week. 27 tablet 3  . Saccharomyces boulardii (FLORASTOR PO) Take 2 capsules by mouth daily.    . traMADol-acetaminophen (ULTRACET) 37.5-325 MG tablet Take 1-2 tablets by mouth every 6 (six) hours as needed for moderate pain or severe pain.   0  . triamcinolone (NASACORT AQ) 55 MCG/ACT AERO nasal inhaler Place 2 sprays daily into the nose. 1 Inhaler 12  . triamcinolone cream (KENALOG) 0.1 % Apply 1 application topically 2 (two) times daily. For 2 weeks, then as needed 80 g 2  . vitamin C (ASCORBIC ACID) 500 MG tablet Take 500 mg by mouth daily.    Marland Kitchen VITAMIN E PO Take 1 tablet by mouth daily.    Alveda Reasons 20 MG TABS tablet TAKE 1 TABLET(20 MG) BY MOUTH DAILY WITH SUPPER 90 tablet 1   No current facility-administered medications on file prior to visit.     Observations/Objective: Unable due to phone only Lab Results  Component Value Date   WBC 7.0 09/18/2017   HGB 13.5 09/18/2017   HCT 40.6 09/18/2017   PLT 175.0 09/18/2017   GLUCOSE 107 (H) 09/15/2017   CHOL 152 09/18/2017   TRIG 174.0 (H) 09/18/2017   HDL 42.00 09/18/2017   LDLDIRECT 107.0 09/17/2016   LDLCALC 75 09/18/2017   ALT 12 09/18/2017   AST 19 09/18/2017   NA 142  09/15/2017   K 3.2 (L) 09/15/2017   CL 102 09/15/2017   CREATININE 0.80 09/15/2017   BUN 15 09/15/2017   CO2 28 05/28/2017  TSH 3.14 09/18/2017   HGBA1C 6.3 09/18/2017   Assessment and Plan: See notes  Follow Up Instructions: See notes   I discussed the assessment and treatment plan with the patient. The patient was provided an opportunity to ask questions and all were answered. The patient agreed with the plan and demonstrated an understanding of the instructions.   The patient was advised to call back or seek an in-person evaluation if the symptoms worsen or if the condition fails to improve as anticipated.  Cathlean Cower, MD

## 2018-09-19 ENCOUNTER — Encounter: Payer: Self-pay | Admitting: Internal Medicine

## 2018-09-19 DIAGNOSIS — M1990 Unspecified osteoarthritis, unspecified site: Secondary | ICD-10-CM | POA: Insufficient documentation

## 2018-09-19 NOTE — Assessment & Plan Note (Signed)
stable overall by history and exam, recent data reviewed with pt, and pt to continue medical treatment as before,  to f/u any worsening symptoms or concerns  

## 2018-09-19 NOTE — Assessment & Plan Note (Signed)
With persistent nighttime pain, ok for increased gabapentin to 300 qhs

## 2018-09-19 NOTE — Assessment & Plan Note (Addendum)
bilat hand and knees, for volt gel prn,  to f/u any worsening symptoms or concerns  In addition to the time spent performing CPE, I spent an additional 25 minutes face to face,in which greater than 50% of this time was spent in counseling and coordination of care for patient's acute illness as documented, including the differential dx, treatment, further evaluation and other management of osteoarthritis, anxiety.depression, hyperglycemia, hypothryoidism, peripiheral neuropathy pain

## 2018-09-21 ENCOUNTER — Telehealth: Payer: Self-pay | Admitting: Internal Medicine

## 2018-09-21 MED ORDER — FEXOFENADINE HCL 180 MG PO TABS: 180.0000 mg | ORAL_TABLET | Freq: Every day | ORAL | 1 refills | Status: DC

## 2018-09-21 MED ORDER — POTASSIUM CHLORIDE CRYS ER 20 MEQ PO TBCR: 20.0000 meq | EXTENDED_RELEASE_TABLET | Freq: Every day | ORAL | 1 refills | Status: DC

## 2018-09-21 NOTE — Telephone Encounter (Signed)
Copied from [place] [phone]. Topic: Quick Communication - Rx Refill/Question >> Sep 21, 2018  1:48 PM Oneta Rack wrote: Medication: potassium chloride SA (K-DUR) 20 MEQ tablet, fexofenadine (RA ALLERGY RELIEF) 180 MG tablet    Preferred Pharmacy (with phone number or street name):  [id number] - [place], [place] [address] AT [address] [phone] (Phone) [phone] (Fax)    Agent: Please be advised that RX refills may take up to 3 business days. We ask that you follow-up with your pharmacy.

## 2018-09-21 NOTE — Telephone Encounter (Signed)
Reviewed chart pt is up-to-date sent refills to [facility].../lmb  

## 2018-09-29 ENCOUNTER — Telehealth: Payer: Self-pay | Admitting: Internal Medicine

## 2018-09-29 MED ORDER — ALBUTEROL SULFATE HFA 108 (90 BASE) MCG/ACT IN AERS: INHALATION_SPRAY | RESPIRATORY_TRACT | 2 refills | Status: DC

## 2018-09-29 NOTE — Telephone Encounter (Signed)
Patient called [facility] 09/26/2018 at 1058am.  States she needs a script for advair 500/50.  States she has seasonal allergies and asthma.  She is having congestion and needs the medication so she does not have wheezing.

## 2018-10-11 ENCOUNTER — Other Ambulatory Visit: Payer: Self-pay | Admitting: Internal Medicine

## 2018-11-02 ENCOUNTER — Other Ambulatory Visit: Payer: Self-pay | Admitting: Internal Medicine

## 2018-11-03 DIAGNOSIS — G4733 Obstructive sleep apnea (adult) (pediatric): Secondary | ICD-10-CM | POA: Diagnosis not present

## 2018-11-09 ENCOUNTER — Other Ambulatory Visit: Payer: Self-pay | Admitting: Internal Medicine

## 2018-11-09 ENCOUNTER — Other Ambulatory Visit (INDEPENDENT_AMBULATORY_CARE_PROVIDER_SITE_OTHER): Payer: Medicare Other

## 2018-11-09 DIAGNOSIS — E039 Hypothyroidism, unspecified: Secondary | ICD-10-CM

## 2018-11-09 DIAGNOSIS — Z0000 Encounter for general adult medical examination without abnormal findings: Secondary | ICD-10-CM

## 2018-11-09 DIAGNOSIS — E876 Hypokalemia: Secondary | ICD-10-CM

## 2018-11-09 DIAGNOSIS — E538 Deficiency of other specified B group vitamins: Secondary | ICD-10-CM | POA: Diagnosis not present

## 2018-11-09 DIAGNOSIS — E611 Iron deficiency: Secondary | ICD-10-CM

## 2018-11-09 DIAGNOSIS — E559 Vitamin D deficiency, unspecified: Secondary | ICD-10-CM | POA: Diagnosis not present

## 2018-11-09 DIAGNOSIS — R739 Hyperglycemia, unspecified: Secondary | ICD-10-CM

## 2018-11-09 LAB — URINALYSIS, ROUTINE W REFLEX MICROSCOPIC
Hgb urine dipstick: NEGATIVE
Ketones, ur: NEGATIVE
Nitrite: NEGATIVE
RBC / HPF: NONE SEEN (ref 0–?)
Specific Gravity, Urine: 1.025 (ref 1.000–1.030)
Total Protein, Urine: NEGATIVE
Urine Glucose: NEGATIVE
Urobilinogen, UA: 1 (ref 0.0–1.0)
pH: 5.5 (ref 5.0–8.0)

## 2018-11-09 LAB — LIPID PANEL
Cholesterol: 93 mg/dL (ref 0–200)
HDL: 51.4 mg/dL (ref >39.00)
LDL Cholesterol: 23 mg/dL (ref 0–99)
NonHDL: 41.17
Total CHOL/HDL Ratio: 2
Triglycerides: 92 mg/dL (ref 0.0–149.0)
VLDL: 18.4 mg/dL (ref 0.0–40.0)

## 2018-11-09 LAB — HEPATIC FUNCTION PANEL
ALT: 16 U/L (ref 0–35)
AST: 19 U/L (ref 0–37)
Albumin: 4.1 g/dL (ref 3.5–5.2)
Alkaline Phosphatase: 65 U/L (ref 39–117)
Bilirubin, Direct: 0.2 mg/dL (ref 0.0–0.3)
Total Bilirubin: 1.4 mg/dL — ABNORMAL HIGH (ref 0.2–1.2)
Total Protein: 6.7 g/dL (ref 6.0–8.3)

## 2018-11-09 LAB — VITAMIN D 25 HYDROXY (VIT D DEFICIENCY, FRACTURES): VITD: 39.29 ng/mL (ref 30.00–100.00)

## 2018-11-09 LAB — CBC WITH DIFFERENTIAL/PLATELET
Basophils Absolute: 0 10*3/uL (ref 0.0–0.1)
Basophils Relative: 0.4 % (ref 0.0–3.0)
Eosinophils Absolute: 0.2 10*3/uL (ref 0.0–0.7)
Eosinophils Relative: 3.8 % (ref 0.0–5.0)
HCT: 42.7 % (ref 36.0–46.0)
Hemoglobin: 14 g/dL (ref 12.0–15.0)
Lymphocytes Relative: 28.3 % (ref 12.0–46.0)
Lymphs Abs: 1.8 10*3/uL (ref 0.7–4.0)
MCHC: 32.9 g/dL (ref 30.0–36.0)
MCV: 98.6 fl (ref 78.0–100.0)
Monocytes Absolute: 0.6 10*3/uL (ref 0.1–1.0)
Monocytes Relative: 9.7 % (ref 3.0–12.0)
Neutro Abs: 3.7 10*3/uL (ref 1.4–7.7)
Neutrophils Relative %: 57.8 % (ref 43.0–77.0)
Platelets: 158 10*3/uL (ref 150.0–400.0)
RBC: 4.33 Mil/uL (ref 3.87–5.11)
RDW: 14.5 % (ref 11.5–15.5)
WBC: 6.4 10*3/uL (ref 4.0–10.5)

## 2018-11-09 LAB — BASIC METABOLIC PANEL
BUN: 12 mg/dL (ref 6–23)
CO2: 29 mEq/L (ref 19–32)
Calcium: 9.5 mg/dL (ref 8.4–10.5)
Chloride: 104 mEq/L (ref 96–112)
Creatinine, Ser: 0.82 mg/dL (ref 0.40–1.20)
GFR: 66.87 mL/min (ref >60.00)
Glucose, Bld: 101 mg/dL — ABNORMAL HIGH (ref 70–99)
Potassium: 3.4 mEq/L — ABNORMAL LOW (ref 3.5–5.1)
Sodium: 143 mEq/L (ref 135–145)

## 2018-11-09 LAB — HEMOGLOBIN A1C: Hgb A1c MFr Bld: 6 % (ref 4.6–6.5)

## 2018-11-09 LAB — IBC PANEL
Iron: 78 ug/dL (ref 42–145)
Saturation Ratios: 24.9 % (ref 20.0–50.0)
Transferrin: 224 mg/dL (ref 212.0–360.0)

## 2018-11-09 LAB — TSH: TSH: 3.33 u[IU]/mL (ref 0.35–4.50)

## 2018-11-09 LAB — VITAMIN B12: Vitamin B-12: 790 pg/mL (ref 211–911)

## 2018-11-09 LAB — T4, FREE: Free T4: 0.81 ng/dL (ref 0.60–1.60)

## 2018-11-09 MED ORDER — POTASSIUM CHLORIDE ER 10 MEQ PO TBCR: 40.0000 meq | EXTENDED_RELEASE_TABLET | Freq: Every day | ORAL | 3 refills | Status: DC

## 2018-11-18 DIAGNOSIS — M79641 Pain in right hand: Secondary | ICD-10-CM | POA: Diagnosis not present

## 2018-11-18 DIAGNOSIS — M79642 Pain in left hand: Secondary | ICD-10-CM | POA: Diagnosis not present

## 2018-11-18 DIAGNOSIS — M170 Bilateral primary osteoarthritis of knee: Secondary | ICD-10-CM | POA: Diagnosis not present

## 2018-12-01 ENCOUNTER — Other Ambulatory Visit (INDEPENDENT_AMBULATORY_CARE_PROVIDER_SITE_OTHER): Payer: Medicare Other

## 2018-12-01 DIAGNOSIS — M1009 Idiopathic gout, multiple sites: Secondary | ICD-10-CM | POA: Diagnosis not present

## 2018-12-01 DIAGNOSIS — E876 Hypokalemia: Secondary | ICD-10-CM

## 2018-12-01 DIAGNOSIS — Z79899 Other long term (current) drug therapy: Secondary | ICD-10-CM | POA: Diagnosis not present

## 2018-12-01 LAB — BASIC METABOLIC PANEL
BUN: 13 mg/dL (ref 6–23)
CO2: 28 mEq/L (ref 19–32)
Calcium: 9.4 mg/dL (ref 8.4–10.5)
Chloride: 102 mEq/L (ref 96–112)
Creatinine, Ser: 0.87 mg/dL (ref 0.40–1.20)
GFR: 62.45 mL/min (ref >60.00)
Glucose, Bld: 159 mg/dL — ABNORMAL HIGH (ref 70–99)
Potassium: 3.7 mEq/L (ref 3.5–5.1)
Sodium: 141 mEq/L (ref 135–145)

## 2018-12-02 ENCOUNTER — Other Ambulatory Visit: Payer: Self-pay | Admitting: Physician Assistant

## 2018-12-04 ENCOUNTER — Ambulatory Visit (INDEPENDENT_AMBULATORY_CARE_PROVIDER_SITE_OTHER): Payer: Medicare Other | Admitting: Internal Medicine

## 2018-12-04 ENCOUNTER — Encounter: Payer: Self-pay | Admitting: Internal Medicine

## 2018-12-04 ENCOUNTER — Other Ambulatory Visit: Payer: Self-pay

## 2018-12-04 VITALS — BP 126/86 | HR 61 | Temp 98.6°F | Ht 66.0 in

## 2018-12-04 DIAGNOSIS — Z20828 Contact with and (suspected) exposure to other viral communicable diseases: Secondary | ICD-10-CM

## 2018-12-04 DIAGNOSIS — R739 Hyperglycemia, unspecified: Secondary | ICD-10-CM

## 2018-12-04 DIAGNOSIS — J4531 Mild persistent asthma with (acute) exacerbation: Secondary | ICD-10-CM | POA: Diagnosis not present

## 2018-12-04 DIAGNOSIS — Z20822 Contact with and (suspected) exposure to covid-19: Secondary | ICD-10-CM

## 2018-12-04 DIAGNOSIS — J45901 Unspecified asthma with (acute) exacerbation: Secondary | ICD-10-CM

## 2018-12-04 MED ORDER — PREDNISONE 10 MG PO TABS: ORAL_TABLET | ORAL | 0 refills | Status: DC

## 2018-12-04 MED ORDER — METHYLPREDNISOLONE ACETATE 80 MG/ML IJ SUSP
80.0000 mg | Freq: Once | INTRAMUSCULAR | Status: AC
  Administered 2018-12-04: 80 mg via INTRAMUSCULAR

## 2018-12-04 NOTE — Assessment & Plan Note (Signed)
Mild to mod, for depomedrol IM 80, predpac asd, cont inhaler use prn,  to f/u any worsening symptoms or concerns

## 2018-12-04 NOTE — Progress Notes (Signed)
Subjective:    Patient ID: [name], female    DOB: Jun 29, 1935, 83 y.o.   MRN: [id number]  HPI    Here with acute onset mild to mod 2-3 days general weakness and malaise, with prod cough clear sputum with chest tightness and wheeziness, better with inhaler but having to use over twice per day, but Pt denies orthopnea, PND, increased LE swelling, palpitations, dizziness or syncope. Pt denies new neurological symptoms such as new headache, or facial or extremity weakness or numbness   Pt denies polydipsia, polyuria,  Pt requests COVID testing though admittedly low risk.  Past Medical History:  Diagnosis Date  . Allergy   . Anxiety   . Arthritis    no cartlidge in knees  . Asthma   . Bradycardia    a. atenolol stopped due to this, HR 40s.  . Breast cancer ([place]) Receptor + her2 _ 12/26/2010   left  . CAD in native artery    a.  CAD s/p RCA stent 2000 with residual mild-mod disease of left system 2001.  Marland Kitchen Chronic atrial fibrillation   . COPD (chronic obstructive pulmonary disease) ([place])   . Essential hypertension 03/24/2015  . Gout    post operative  . Hx of radiation therapy 04/02/11 -05/20/11   left breast  . Hyperlipidemia   . Hypothyroidism 03/24/2015  . Incontinence of urine   . Malignant neoplasm of upper-outer quadrant of female breast ([place]) 12/26/2010  . Osteoporosis 03/24/2015  . Peripheral neuropathy 03/24/2015  . Plantar fasciitis   . Sleep apnea    Past Surgical History:  Procedure Laterality Date  . BREAST LUMPECTOMY W/ NEEDLE LOCALIZATION  01/29/2011   Left with [facility] Dr Margot Chimes  . CHOLECYSTECTOMY  1955  . CORONARY ANGIOPLASTY WITH STENT PLACEMENT  2000  . DILATION AND CURETTAGE OF UTERUS  1976   and 1996  . HAND SURGERY  1992   tendons between thumb and forefinger  . SKIN BIOPSY     1994, 2006, 2008, 2010, 2011, 2011, 2012-  pre-cancerous  . TONSILLECTOMY  1955    reports that she quit smoking about 56 years ago. She has a 4.00 pack-year smoking history. She  has never used smokeless tobacco. She reports that she does not drink alcohol or use drugs. family history includes Asthma in her sister; Cancer in her father; Heart attack in her brother and father; Heart disease in her brother and father. Allergies  Allergen Reactions  . Crestor [Rosuvastatin Calcium]     Severe liver function problems  . Hydrocodone-Acetaminophen Anxiety and Itching  . Lidocaine Hcl Other (See Comments)    Novacaine:  Becomes very shaky  . Lipitor [Atorvastatin Calcium] Other (See Comments)    Elevated liver function   . Procaine Other (See Comments)    shaking  . Rosuvastatin Other (See Comments)    Severe liver function problems  . Theophylline Other (See Comments)    Becomes very shaky skaking skaking  . Vicodin [Hydrocodone-Acetaminophen] Itching    Itching all over  . Codeine Nausea Only, Anxiety and Other (See Comments)    Also complained of dizziness.  Has same problems with oxycodone and hydrocodone Visual Disturbance, Balance Difficulty, Dizzy "Room Spinning; "Swimming" Balance, vision, nausea, dizzy, "swimming", "room spinning" Balance, vision, nausea, dizzy, "swimming", "room spinning"  . Oxycodone-Acetaminophen Nausea Only, Anxiety and Other (See Comments)    Visual Disturbance, Balance Difficulty, Dizzy "Room Spinning; "Swimming"  . Propoxyphene Anxiety, Nausea Only and Other (See Comments)  Visual Disturbance, Balance Difficulty, Dizzy "Room Spinning; "Swimming" Balance, vision, nausea, dizzy, "swimming", "room spinning" Balance, vision, nausea, dizzy, "swimming, "room spinning"  . Quinine Derivatives Nausea Only    dizzy  . Sulfa Antibiotics Nausea Only    Also complained of dizziness  . Atorvastatin Other (See Comments)    Severe liver function problems  . Ephedrine Other (See Comments)    shaking  . Other Other (See Comments)    Quinine causes nausea, dizzy  . Oxycodone Other (See Comments)    Balance, vision, nausea, dizzy,  "swimming", "room spinning"  . Cephalexin Rash  . Epinephrine Other (See Comments)    Patient becomes "shaky" shaking shaking  . Penicillins Rash    Pt reported all over body rash in 1958 Has patient had a PCN reaction causing immediate rash, facial/tongue/throat swelling, SOB or lightheadedness with hypotension:Yes Has patient had a PCN reaction causing severe rash involving mucus membranes or skin necrosis: No Has patient had a PCN reaction that required hospitalization: No Has patient had a PCN reaction occurring within the last 10 years:No    . Theophyllines Other (See Comments)    Patient becomes "shaky"   Current Outpatient Medications on File Prior to Visit  Medication Sig Dispense Refill  . albuterol (PROAIR HFA) 108 (90 Base) MCG/ACT inhaler INHALE 2 PUFFS BY MOUTH INTO THE LUNGS IF NEEDED FOR WHEEZING OR SHORTNESS OF BREATH 8.5 g 2  . albuterol (PROVENTIL) (2.5 MG/3ML) 0.083% nebulizer solution Take 3 mLs (2.5 mg total) by nebulization every 6 (six) hours as needed for wheezing or shortness of breath. 75 mL 12  . allopurinol (ZYLOPRIM) 300 MG tablet Take 300 mg by mouth daily.  1  . ALPRAZolam (XANAX) 0.5 MG tablet TAKE 1 TO 2 TABLETS BY MOUTH TWICE DAILY IF NEEDED FOR ANXIETY/SLEEP 60 tablet 3  . azelastine (ASTELIN) 0.1 % nasal spray Place 2 sprays into both nostrils daily. Use in each nostril as directed    . azelastine (OPTIVAR) 0.05 % ophthalmic solution Place 1 drop into both eyes 2 (two) times daily. (Patient taking differently: Place 1 drop into both eyes 2 (two) times daily as needed (allergies and dry eyes). ) 6 mL 12  . B Complex Vitamins (VITAMIN B COMPLEX PO) Take 1 tablet by mouth daily.    . Biotin 5000 MCG CAPS Take 5,000 mcg by mouth daily.     . cholecalciferol (VITAMIN D) 1000 UNITS tablet Take 1,000 Units by mouth daily.    Marland Kitchen co-enzyme Q-10 50 MG capsule Take 400 mg by mouth daily.    . colchicine (COLCRYS) 0.6 MG tablet Take 0.6 mg by mouth daily as needed  (flare  up).     . Cyanocobalamin (VITAMIN B-12 PO) Take 1 tablet by mouth daily.    . diclofenac sodium (VOLTAREN) 1 % GEL Apply 4 g topically 4 (four) times daily as needed. 200 g 11  . doxycycline (VIBRA-TABS) 100 MG tablet Take 1 tablet (100 mg total) by mouth 2 (two) times daily. 20 tablet 1  . escitalopram (LEXAPRO) 10 MG tablet TAKE 1 TABLET BY MOUTH EVERY DAY 90 tablet 1  . Evolocumab with Infusor ([place]) 420 MG/3.5ML SOCT Inject 1 Units into the skin every 30 (thirty) days. 1 Cartridge 11  . ezetimibe (ZETIA) 10 MG tablet Take 1/2 to 1 tablet by mouth daily as directed 90 tablet 2  . fexofenadine (RA ALLERGY RELIEF) 180 MG tablet Take 1 tablet (180 mg total) by mouth daily. 90 tablet  1  . gabapentin (NEURONTIN) 300 MG capsule Take 1 capsule (300 mg total) by mouth 3 (three) times daily. 270 capsule 1  . hydrochlorothiazide (HYDRODIURIL) 25 MG tablet TAKE 2 TABLETS BY MOUTH ONCE DAILY 180 tablet 1  . levocetirizine (XYZAL) 5 MG tablet TAKE 1 TABLET(5 MG) BY MOUTH AT BEDTIME 30 tablet 5  . metoprolol succinate (TOPROL-XL) 25 MG 24 hr tablet Take 0.5 tablets (12.5 mg total) by mouth daily. 45 tablet 2  . montelukast (SINGULAIR) 10 MG tablet TAKE 1 TABLET BY MOUTH AT BEDTIME 90 tablet 1  . mupirocin ointment (BACTROBAN) 2 % Use twice a day as directed 30 g 1  . nystatin cream (MYCOSTATIN) Apply 1 application topically daily as needed for dry skin.   0  . potassium chloride (K-DUR) 10 MEQ tablet Take 4 tablets (40 mEq total) by mouth daily. 360 tablet 3  . potassium chloride SA (K-DUR) 20 MEQ tablet Take 1 tablet (20 mEq total) by mouth daily. 90 tablet 1  . Pyridoxine HCl (VITAMIN B-6 PO) Take 1 tablet by mouth daily.    . rosuvastatin (CRESTOR) 10 MG tablet Take 1 tablet by mouth twice a week. 27 tablet 3  . Saccharomyces boulardii (FLORASTOR PO) Take 2 capsules by mouth daily.    Marland Kitchen SYNTHROID 75 MCG tablet TAKE 1 TABLET(75 MCG) BY MOUTH DAILY 90 tablet 1  .  traMADol-acetaminophen (ULTRACET) 37.5-325 MG tablet Take 1-2 tablets by mouth every 6 (six) hours as needed for moderate pain or severe pain.   0  . triamcinolone (NASACORT AQ) 55 MCG/ACT AERO nasal inhaler Place 2 sprays daily into the nose. 1 Inhaler 12  . triamcinolone cream (KENALOG) 0.1 % Apply 1 application topically 2 (two) times daily. For 2 weeks, then as needed 80 g 2  . vitamin C (ASCORBIC ACID) 500 MG tablet Take 500 mg by mouth daily.    Marland Kitchen VITAMIN E PO Take 1 tablet by mouth daily.    Alveda Reasons 20 MG TABS tablet TAKE 1 TABLET(20 MG) BY MOUTH DAILY WITH SUPPER 90 tablet 1   No current facility-administered medications on file prior to visit.    Review of Systems  Constitutional: Negative for other unusual diaphoresis or sweats HENT: Negative for ear discharge or swelling Eyes: Negative for other worsening visual disturbances Respiratory: Negative for stridor or other swelling  Gastrointestinal: Negative for worsening distension or other blood Genitourinary: Negative for retention or other urinary change Musculoskeletal: Negative for other MSK pain or swelling Skin: Negative for color change or other new lesions Neurological: Negative for worsening tremors and other numbness  Psychiatric/Behavioral: Negative for worsening agitation or other fatigue All other system neg per pt    Objective:   Physical Exam BP 126/86   Pulse 61   Temp 98.6 F (37 C) (Oral)   Ht '5\' 6"'$  (1.676 m)   SpO2 96%   BMI 47.94 kg/m  VS noted,  Constitutional: Pt appears in NAD HENT: Head: NCAT.  Right Ear: External ear normal.  Left Ear: External ear normal.  Eyes: . Pupils are equal, round, and reactive to light. Conjunctivae and EOM are normal Nose: without d/c or deformity Neck: Neck supple. Gross normal ROM Cardiovascular: Normal rate and regular rhythm.   Pulmonary/Chest: Effort normal and breath sounds without rales or wheezing.  Abd:  Soft, NT, ND, + BS, no organomegaly Neurological:  Pt is alert. At baseline orientation, motor grossly intact Skin: Skin is warm. No rashes, other new lesions, no LE edema  Psychiatric: Pt behavior is normal without agitation  No other exam findings Lab Results  Component Value Date   WBC 6.4 11/09/2018   HGB 14.0 11/09/2018   HCT 42.7 11/09/2018   PLT 158.0 11/09/2018   GLUCOSE 159 (H) 12/01/2018   CHOL 93 11/09/2018   TRIG 92.0 11/09/2018   HDL 51.40 11/09/2018   LDLDIRECT 107.0 09/17/2016   LDLCALC 23 11/09/2018   ALT 16 11/09/2018   AST 19 11/09/2018   NA 141 12/01/2018   K 3.7 12/01/2018   CL 102 12/01/2018   CREATININE 0.87 12/01/2018   BUN 13 12/01/2018   CO2 28 12/01/2018   TSH 3.33 11/09/2018   HGBA1C 6.0 11/09/2018        Assessment & Plan:

## 2018-12-04 NOTE — Assessment & Plan Note (Signed)
[place] for testing,  to f/u any worsening symptoms or concerns

## 2018-12-04 NOTE — Patient Instructions (Signed)
You had the steroid shot today  Please take all new medication as prescribed - the prednisone  Please go to the [facility] site for COVID testing  Please continue all other medications as before, and refills have been done if requested.  Please have the pharmacy call with any other refills you may need.  Please keep your appointments with your specialists as you may have planned

## 2018-12-04 NOTE — Assessment & Plan Note (Signed)
stable overall by history and exam, recent data reviewed with pt, and pt to continue medical treatment as before,  to f/u any worsening symptoms or concerns  

## 2018-12-17 ENCOUNTER — Other Ambulatory Visit: Payer: Self-pay

## 2018-12-17 MED ORDER — GABAPENTIN 300 MG PO CAPS: 300.0000 mg | ORAL_CAPSULE | Freq: Three times a day (TID) | ORAL | 1 refills | Status: DC

## 2018-12-23 DIAGNOSIS — J3089 Other allergic rhinitis: Secondary | ICD-10-CM | POA: Diagnosis not present

## 2018-12-23 DIAGNOSIS — T63461D Toxic effect of venom of wasps, accidental (unintentional), subsequent encounter: Secondary | ICD-10-CM | POA: Diagnosis not present

## 2018-12-23 DIAGNOSIS — J4540 Moderate persistent asthma, uncomplicated: Secondary | ICD-10-CM | POA: Diagnosis not present

## 2018-12-23 DIAGNOSIS — J301 Allergic rhinitis due to pollen: Secondary | ICD-10-CM | POA: Diagnosis not present

## 2019-01-14 ENCOUNTER — Telehealth: Payer: Self-pay | Admitting: Internal Medicine

## 2019-01-14 MED ORDER — ESCITALOPRAM OXALATE 10 MG PO TABS: 10.0000 mg | ORAL_TABLET | Freq: Every day | ORAL | 1 refills | Status: DC

## 2019-01-14 MED ORDER — LEVOTHYROXINE SODIUM 75 MCG PO TABS: ORAL_TABLET | ORAL | 1 refills | Status: DC

## 2019-01-14 NOTE — Telephone Encounter (Signed)
Copied from [place] [id number]. Topic: General - Other >> Jan 14, 2019 11:38 AM Carolyn Stare wrote: Re refill   escitalopram (LEXAPRO) 10 MG tablet    montelukast (SINGULAIR) 10 MG tablet       SYNTHROID 75 MCG tablet   [facility]

## 2019-01-28 DIAGNOSIS — Z1231 Encounter for screening mammogram for malignant neoplasm of breast: Secondary | ICD-10-CM | POA: Diagnosis not present

## 2019-01-28 DIAGNOSIS — Z853 Personal history of malignant neoplasm of breast: Secondary | ICD-10-CM | POA: Diagnosis not present

## 2019-01-28 LAB — HM MAMMOGRAPHY

## 2019-01-29 ENCOUNTER — Other Ambulatory Visit: Payer: Self-pay | Admitting: *Deleted

## 2019-01-29 MED ORDER — MONTELUKAST SODIUM 10 MG PO TABS: 10.0000 mg | ORAL_TABLET | Freq: Every day | ORAL | 1 refills | Status: DC

## 2019-01-29 NOTE — Telephone Encounter (Signed)
Per patient pharmacy closes at 7 pm and is closed all weekend asking for this Rx ASAP please. montelukast (SINGULAIR) 10 MG tablet

## 2019-02-04 DIAGNOSIS — H43813 Vitreous degeneration, bilateral: Secondary | ICD-10-CM | POA: Diagnosis not present

## 2019-02-04 DIAGNOSIS — D3131 Benign neoplasm of right choroid: Secondary | ICD-10-CM | POA: Diagnosis not present

## 2019-02-04 DIAGNOSIS — H35373 Puckering of macula, bilateral: Secondary | ICD-10-CM | POA: Diagnosis not present

## 2019-02-04 DIAGNOSIS — H3091 Unspecified chorioretinal inflammation, right eye: Secondary | ICD-10-CM | POA: Diagnosis not present

## 2019-02-08 DIAGNOSIS — G4733 Obstructive sleep apnea (adult) (pediatric): Secondary | ICD-10-CM | POA: Diagnosis not present

## 2019-02-15 ENCOUNTER — Other Ambulatory Visit: Payer: Self-pay | Admitting: Interventional Cardiology

## 2019-02-15 MED ORDER — EZETIMIBE 10 MG PO TABS: ORAL_TABLET | ORAL | 0 refills | Status: DC

## 2019-02-19 ENCOUNTER — Encounter: Payer: Self-pay | Admitting: Oncology

## 2019-03-08 ENCOUNTER — Encounter: Payer: Self-pay | Admitting: Physician Assistant

## 2019-03-08 ENCOUNTER — Other Ambulatory Visit: Payer: Self-pay | Admitting: *Deleted

## 2019-03-08 MED ORDER — RIVAROXABAN 20 MG PO TABS: ORAL_TABLET | ORAL | 0 refills | Status: DC

## 2019-03-08 NOTE — Progress Notes (Signed)
Cardiology Office Note    Date:  03/09/2019   ID:  IVAH GIRARDOT, DOB Mar 21, 1936, MRN [id number]  PCP:  Biagio Borg, MD  Cardiologist:  Larae Grooms, MD  Electrophysiologist:  None   Chief Complaint: 1 year f/u atrial fib, CAD  History of Present Illness:   [name] is a 83 y.o. female with history of chronic atrial fib, morbid obesity, CAD s/p RCA stent 2000 with residual mild-mod disease of left system 2001, hyperlipidemia, asthma, breast CA, anxiety, arthritis, HTN, gout, OSA, hypothyroidism, lower extremity edema, bradycardia who presents for yearly follow-up.  Last cath 2001 showed 50% mD1, 20-25% LCx, 20-25% ISR of RCA stent. Last nuc 01/2011 showed small partially reversible defect in anterolateral area, could represent ischemia but could be due to breast attenuation, EF 67%. She was noted to be bradycardic in the 40s in 03/2017 so atenolol was stopped. She noticed after stopping atenolol that she would feel her heart pound faster when she did activity so low dose metoprolol was added back. Last labs 11/2018 with K 3.7, Cr 0.87, glucose 159, LDL 23, normal CBC, normal TSH.  She reports she is feeling great - she says in general she just never feels bad. She has been very careful during the pandemic. Husband picks up grocery delivery. Her daughter works at [employer]. She denies any CP, SOB, palpitations, dizziness, or bleeding. Tolerating all meds without difficulty. Of note she saw PCP over the summer for respiratory symptoms - plan was for Covid testing but the line was too long so she never went. Symptoms resolved with standard asthma rx.  Past Medical History:  Diagnosis Date   Allergy    Anxiety    Arthritis    no cartlidge in knees   Asthma    Bradycardia    a. atenolol stopped due to this, HR 40s.   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  CAD s/p RCA stent 2000 with residual mild-mod disease of left system 2001.     Chronic atrial fibrillation (HCC)    COPD (chronic obstructive pulmonary disease) ([place])    Essential hypertension 03/24/2015   Gout    post operative   Hx of radiation therapy 04/02/11 -05/20/11   left breast   Hyperlipidemia    Hypothyroidism 03/24/2015   Incontinence of urine    Malignant neoplasm of upper-outer quadrant of female breast ([place]) 12/26/2010   Osteoporosis 03/24/2015   Peripheral neuropathy 03/24/2015   Plantar fasciitis    Sleep apnea     Past Surgical History:  Procedure Laterality Date   BREAST LUMPECTOMY W/ NEEDLE LOCALIZATION  01/29/2011   Left with [facility] Dr Margot Chimes   CHOLECYSTECTOMY  1955   CORONARY ANGIOPLASTY WITH STENT PLACEMENT  2000   DILATION AND CURETTAGE OF UTERUS  1976   and 1996   [place]   tendons between thumb and forefinger   SKIN BIOPSY     1994, 2006, 2008, 2010, 2011, 2011, 2012-  pre-cancerous   TONSILLECTOMY  1955    Current Medications: Current Meds  Medication Sig   albuterol (PROAIR HFA) 108 (90 Base) MCG/ACT inhaler INHALE 2 PUFFS BY MOUTH INTO THE LUNGS IF NEEDED FOR WHEEZING OR SHORTNESS OF BREATH   albuterol (PROVENTIL) (2.5 MG/3ML) 0.083% nebulizer solution Take 3 mLs (2.5 mg total) by nebulization every 6 (six) hours as needed for wheezing or shortness of breath.  allopurinol (ZYLOPRIM) 300 MG tablet Take 300 mg by mouth daily.   ALPRAZolam (XANAX) 0.5 MG tablet TAKE 1 TO 2 TABLETS BY MOUTH TWICE DAILY IF NEEDED FOR ANXIETY/SLEEP   azelastine (ASTELIN) 0.1 % nasal spray Place 2 sprays into both nostrils daily. Use in each nostril as directed   azelastine (OPTIVAR) 0.05 % ophthalmic solution Place 1 drop into both eyes 2 (two) times daily.   B Complex Vitamins (VITAMIN B COMPLEX PO) Take 1 tablet by mouth daily.   Biotin 5000 MCG CAPS Take 5,000 mcg by mouth daily.    cholecalciferol (VITAMIN D) 1000 UNITS tablet Take 1,000 Units by mouth daily.   co-enzyme Q-10 50 MG capsule Take 400 mg  by mouth daily.   colchicine (COLCRYS) 0.6 MG tablet Take 0.6 mg by mouth daily as needed (flare  up).    Cyanocobalamin (VITAMIN B-12 PO) Take 1 tablet by mouth daily.   cycloSPORINE (RESTASIS) 0.05 % ophthalmic emulsion Apply to eye as needed.   diclofenac sodium (VOLTAREN) 1 % GEL Apply 4 g topically 4 (four) times daily as needed.   EQL Natural Zinc 50 MG TABS Take 50 mg by mouth daily.   escitalopram (LEXAPRO) 10 MG tablet Take 1 tablet (10 mg total) by mouth daily.   Evolocumab with Infusor ([place]) 420 MG/3.5ML SOCT Inject 1 Units into the skin every 30 (thirty) days.   fexofenadine (RA ALLERGY RELIEF) 180 MG tablet Take 1 tablet (180 mg total) by mouth daily.   gabapentin (NEURONTIN) 300 MG capsule Take 1 capsule (300 mg total) by mouth 3 (three) times daily. (Patient taking differently: Take 300 mg by mouth daily. )   hydrochlorothiazide (HYDRODIURIL) 25 MG tablet TAKE 2 TABLETS BY MOUTH ONCE DAILY   levocetirizine (XYZAL) 5 MG tablet TAKE 1 TABLET(5 MG) BY MOUTH AT BEDTIME   levothyroxine (SYNTHROID) 75 MCG tablet TAKE 1 TABLET(75 MCG) BY MOUTH DAILY   metoprolol succinate (TOPROL-XL) 25 MG 24 hr tablet Take 0.5 tablets (12.5 mg total) by mouth daily.   montelukast (SINGULAIR) 10 MG tablet Take 1 tablet (10 mg total) by mouth at bedtime.   mupirocin ointment (BACTROBAN) 2 % Use twice a day as directed   nystatin cream (MYCOSTATIN) Apply 1 application topically daily as needed for dry skin.    potassium chloride (K-DUR) 10 MEQ tablet Take 4 tablets (40 mEq total) by mouth daily.   predniSONE (DELTASONE) 10 MG tablet 3 tabs by mouth per day for 3 days,2tabs per day for 3 days,1tab per day for 3 days   Pyridoxine HCl (VITAMIN B-6 PO) Take 1 tablet by mouth daily.   rivaroxaban (XARELTO) 20 MG TABS tablet TAKE 1 TABLET(20 MG) BY MOUTH DAILY WITH SUPPER   rosuvastatin (CRESTOR) 10 MG tablet Take 1 tablet by mouth twice a week.   Saccharomyces  boulardii (FLORASTOR PO) Take 2 capsules by mouth daily.   tiZANidine (ZANAFLEX) 2 MG tablet Take 2-4 mg by mouth at bedtime as needed.   traMADol-acetaminophen (ULTRACET) 37.5-325 MG tablet Take 1-2 tablets by mouth every 6 (six) hours as needed for moderate pain or severe pain.    triamcinolone (NASACORT AQ) 55 MCG/ACT AERO nasal inhaler Place 2 sprays daily into the nose.   triamcinolone cream (KENALOG) 0.1 % Apply 1 application topically 2 (two) times daily. For 2 weeks, then as needed   vitamin C (ASCORBIC ACID) 500 MG tablet Take 500 mg by mouth daily.   VITAMIN E PO Take 1 tablet by mouth daily.  WIXELA INHUB 500-50 MCG/DOSE AEPB 1 puff 2 (two) times daily.   ezetimibe (ZETIA) 10 MG tablet Take 1/2 to 1 tablet by mouth daily as directed. Please make overdue appt with Dr. Irish Lack before anymore refills. 1st attempt     Allergies:   Crestor [rosuvastatin calcium], Hydrocodone-acetaminophen, Lidocaine hcl, Lipitor [atorvastatin calcium], Procaine, Rosuvastatin, Theophylline, Vicodin [hydrocodone-acetaminophen], Codeine, Oxycodone-acetaminophen, Propoxyphene, Quinine derivatives, Sulfa antibiotics, Atorvastatin, Ephedrine, Other, Oxycodone, Cephalexin, Epinephrine, Penicillins, and Theophyllines   Social History   Socioeconomic History   Marital status: Married    Spouse name: Not on file   Number of children: Not on file   Years of education: Not on file   Highest education level: Not on file  Occupational History   Not on file  Social Needs   Financial resource strain: Not on file   Food insecurity    Worry: Not on file    Inability: Not on file   Transportation needs    Medical: Not on file    Non-medical: Not on file  Tobacco Use   Smoking status: Former Smoker    Packs/day: 1.00    Years: 4.00    Pack years: 4.00    Quit date: 12/05/1962    Years since quitting: 56.2   Smokeless tobacco: Never Used  Substance and Sexual Activity   Alcohol use: No     Drug use: No   Sexual activity: Not on file  Lifestyle   Physical activity    Days per week: Not on file    Minutes per session: Not on file   Stress: Not on file  Relationships   Social connections    Talks on phone: Not on file    Gets together: Not on file    Attends religious service: Not on file    Active member of club or organization: Not on file    Attends meetings of clubs or organizations: Not on file    Relationship status: Not on file  Other Topics Concern   Not on file  Social History Narrative   Not on file     Family History:  The patient's family history includes Asthma in her sister; Cancer in her father; Heart attack in her brother and father; Heart disease in her brother and father.  ROS:   Please see the history of present illness. All other systems are reviewed and otherwise negative.    EKGs/Labs/Other Studies Reviewed:    Studies reviewed were summarized above.   EKG:  EKG is ordered today, personally reviewed, demonstrating atrial fibrillation 58bpm, low voltage QRS, nonspecific TW changes  Recent Labs: 11/09/2018: ALT 16; Hemoglobin 14.0; Platelets 158.0; TSH 3.33 12/01/2018: BUN 13; Creatinine, Ser 0.87; Potassium 3.7; Sodium 141  Recent Lipid Panel    Component Value Date/Time   CHOL 93 11/09/2018 0731   CHOL 92 (L) 05/08/2017 1603   TRIG 92.0 11/09/2018 0731   HDL 51.40 11/09/2018 0731   HDL 47 05/08/2017 1603   CHOLHDL 2 11/09/2018 0731   VLDL 18.4 11/09/2018 0731   LDLCALC 23 11/09/2018 0731   LDLCALC 25 05/08/2017 1603   LDLDIRECT 107.0 09/17/2016 1221    PHYSICAL EXAM:    VS:  BP 122/90    Pulse (!) 58    Ht _0  (1.676 m)    Wt 292 lb (132.5 kg)    SpO2 98%    BMI 47.13 kg/m   BMI: Body mass index is 47.13 kg/m.  GEN: Well nourished, well developed obese WF, in  no acute distress HEENT: normocephalic, atraumatic Neck: no JVD, carotid bruits, or masses Cardiac: irregularly irregular, rate controlled; no murmurs,  rubs, or gallops, no edema  Respiratory:  clear to auscultation bilaterally, normal work of breathing GI: soft, nontender, nondistended, + BS MS: no deformity or atrophy Skin: warm and dry, no rash Neuro:  Alert and Oriented x 3, Strength and sensation are intact, follows commands Psych: euthymic mood, full affect  Wt Readings from Last 3 Encounters:  03/09/19 292 lb (132.5 kg)  04/06/18 297 lb (134.7 kg)  03/19/18 297 lb (134.7 kg)     ASSESSMENT & PLAN:   1. Chronic atrial fibrillation - rate controlled. CBC wnl 11/2018; routine labs followed by primary care. Has physical later this month per her report. Reviewed bleeding precautions. Dose of Xarelto remains appropriate according to last CrCl. 2. Bradycardia - she has found a happy medium on low dose metoprolol. No recent symptoms to suggest recurrence. 3. CAD - asymptomatic. Not on ASA given concomitant Xarelto. No recent angina. On low dose BB and cholesterol regimen. 4. Hyperlipidemia - LDL most recently was 23. She has h/o elevated LFTs in the past. In an effort to minimize polypharmacy, at this point now that she is on the [place], I think we can stop the Zetia because I think the Repatha is having the most effect. Will f/u lipid profile and LFTs in another 3 months to trend. Goal LDL <70. Will cc to [place] as update as well.  Disposition: F/u with Dr. Irish Lack in 1 year.  Medication Adjustments/Labs and Tests Ordered: Current medicines are reviewed at length with the patient today.  Concerns regarding medicines are outlined above. Medication changes, Labs and Tests ordered today are summarized above and listed in the Patient Instructions accessible in Encounters.   Signed, Charlie Pitter, PA-C  03/09/2019 10:04 AM    [address], [place], [place]  [address] Phone: [phone]; Fax: [phone]

## 2019-03-08 NOTE — Telephone Encounter (Addendum)
Prescription refill request for Xarelto received.   Last office visit: 02-27-2018 Irish Lack) Weight: 134.7 kg Age: 83 yo Scr:  0.87 (12-01-2018) CrCl: 108 ml/min  Pt is overdue for an office visit. Called pt and informed her that she will need to schedule an office visit with Dr. Irish Lack. Transferred pt to the main line to schedule an office visit. Prescription refill sent.

## 2019-03-09 ENCOUNTER — Encounter: Payer: Self-pay | Admitting: Physician Assistant

## 2019-03-09 ENCOUNTER — Ambulatory Visit: Payer: Medicare Other | Admitting: Physician Assistant

## 2019-03-09 ENCOUNTER — Other Ambulatory Visit: Payer: Self-pay

## 2019-03-09 VITALS — BP 122/90 | HR 58 | Ht 66.0 in | Wt 292.0 lb

## 2019-03-09 DIAGNOSIS — I482 Chronic atrial fibrillation, unspecified: Secondary | ICD-10-CM | POA: Diagnosis not present

## 2019-03-09 DIAGNOSIS — E785 Hyperlipidemia, unspecified: Secondary | ICD-10-CM

## 2019-03-09 DIAGNOSIS — I2510 Atherosclerotic heart disease of native coronary artery without angina pectoris: Secondary | ICD-10-CM | POA: Diagnosis not present

## 2019-03-09 DIAGNOSIS — R001 Bradycardia, unspecified: Secondary | ICD-10-CM

## 2019-03-09 MED ORDER — FEXOFENADINE HCL 180 MG PO TABS: 180.0000 mg | ORAL_TABLET | Freq: Every day | ORAL | 1 refills | Status: DC

## 2019-03-09 NOTE — Patient Instructions (Addendum)
Medication Instructions:  Your physician has recommended you make the following change in your medication:  1.  STOP the Zetia   *If you need a refill on your cardiac medications before your next appointment, please call your pharmacy*  Lab Work: 06/09/2019 Come fasting for Lipid / Liver Test  If you have labs (blood work) drawn today and your tests are completely normal, you will receive your results only by: Marland Kitchen MyChart Message (if you have MyChart) OR . A paper copy in the mail If you have any lab test that is abnormal or we need to change your treatment, we will call you to review the results.  Testing/Procedures: None ordered  Follow-Up: At [facility], you and your health needs are our priority.  As part of our continuing mission to provide you with exceptional heart care, we have created designated Provider Care Teams.  These Care Teams include your primary Cardiologist (physician) and Advanced Practice Providers (APPs -  Physician Assistants and Nurse Practitioners) who all work together to provide you with the care you need, when you need it.  Your next appointment:   12 months  The format for your next appointment:   In Person  Provider:   You may see Larae Grooms, MD or one of the following Advanced Practice Providers on your designated Care Team:    Melina Copa, PA-C  Ermalinda Barrios, PA-C   Other Instructions

## 2019-03-15 ENCOUNTER — Other Ambulatory Visit: Payer: Self-pay

## 2019-03-15 MED ORDER — ROSUVASTATIN CALCIUM 10 MG PO TABS: ORAL_TABLET | ORAL | 3 refills | Status: DC

## 2019-03-23 ENCOUNTER — Other Ambulatory Visit: Payer: Self-pay | Admitting: Internal Medicine

## 2019-03-23 ENCOUNTER — Encounter: Payer: Self-pay | Admitting: Internal Medicine

## 2019-03-23 DIAGNOSIS — E785 Hyperlipidemia, unspecified: Secondary | ICD-10-CM

## 2019-03-23 DIAGNOSIS — R739 Hyperglycemia, unspecified: Secondary | ICD-10-CM

## 2019-03-26 ENCOUNTER — Other Ambulatory Visit (INDEPENDENT_AMBULATORY_CARE_PROVIDER_SITE_OTHER): Payer: Medicare Other

## 2019-03-26 DIAGNOSIS — R739 Hyperglycemia, unspecified: Secondary | ICD-10-CM

## 2019-03-26 DIAGNOSIS — E785 Hyperlipidemia, unspecified: Secondary | ICD-10-CM

## 2019-03-26 DIAGNOSIS — M1712 Unilateral primary osteoarthritis, left knee: Secondary | ICD-10-CM | POA: Diagnosis not present

## 2019-03-26 DIAGNOSIS — M1711 Unilateral primary osteoarthritis, right knee: Secondary | ICD-10-CM | POA: Diagnosis not present

## 2019-03-26 LAB — LIPID PANEL
Cholesterol: 128 mg/dL (ref 0–200)
HDL: 57 mg/dL (ref >39.00)
LDL Cholesterol: 55 mg/dL (ref 0–99)
NonHDL: 70.55
Total CHOL/HDL Ratio: 2
Triglycerides: 79 mg/dL (ref 0.0–149.0)
VLDL: 15.8 mg/dL (ref 0.0–40.0)

## 2019-03-26 LAB — BASIC METABOLIC PANEL
BUN: 22 mg/dL (ref 6–23)
CO2: 30 mEq/L (ref 19–32)
Calcium: 9.6 mg/dL (ref 8.4–10.5)
Chloride: 103 mEq/L (ref 96–112)
Creatinine, Ser: 0.79 mg/dL (ref 0.40–1.20)
GFR: 69.75 mL/min (ref >60.00)
Glucose, Bld: 106 mg/dL — ABNORMAL HIGH (ref 70–99)
Potassium: 3.9 mEq/L (ref 3.5–5.1)
Sodium: 141 mEq/L (ref 135–145)

## 2019-03-26 LAB — CBC WITH DIFFERENTIAL/PLATELET
Basophils Absolute: 0 10*3/uL (ref 0.0–0.1)
Basophils Relative: 0.6 % (ref 0.0–3.0)
Eosinophils Absolute: 0.2 10*3/uL (ref 0.0–0.7)
Eosinophils Relative: 3.7 % (ref 0.0–5.0)
HCT: 41 % (ref 36.0–46.0)
Hemoglobin: 13.4 g/dL (ref 12.0–15.0)
Lymphocytes Relative: 28.9 % (ref 12.0–46.0)
Lymphs Abs: 1.6 10*3/uL (ref 0.7–4.0)
MCHC: 32.7 g/dL (ref 30.0–36.0)
MCV: 99 fl (ref 78.0–100.0)
Monocytes Absolute: 0.6 10*3/uL (ref 0.1–1.0)
Monocytes Relative: 10.6 % (ref 3.0–12.0)
Neutro Abs: 3.1 10*3/uL (ref 1.4–7.7)
Neutrophils Relative %: 56.2 % (ref 43.0–77.0)
Platelets: 134 10*3/uL — ABNORMAL LOW (ref 150.0–400.0)
RBC: 4.14 Mil/uL (ref 3.87–5.11)
RDW: 14.5 % (ref 11.5–15.5)
WBC: 5.5 10*3/uL (ref 4.0–10.5)

## 2019-03-26 LAB — HEPATIC FUNCTION PANEL
ALT: 15 U/L (ref 0–35)
AST: 20 U/L (ref 0–37)
Albumin: 4.1 g/dL (ref 3.5–5.2)
Alkaline Phosphatase: 60 U/L (ref 39–117)
Bilirubin, Direct: 0.2 mg/dL (ref 0.0–0.3)
Total Bilirubin: 1.4 mg/dL — ABNORMAL HIGH (ref 0.2–1.2)
Total Protein: 6.5 g/dL (ref 6.0–8.3)

## 2019-03-26 LAB — TSH: TSH: 3.55 u[IU]/mL (ref 0.35–4.50)

## 2019-03-26 LAB — HEMOGLOBIN A1C: Hgb A1c MFr Bld: 5.9 % (ref 4.6–6.5)

## 2019-03-30 ENCOUNTER — Encounter: Payer: Self-pay | Admitting: Internal Medicine

## 2019-03-30 ENCOUNTER — Ambulatory Visit (INDEPENDENT_AMBULATORY_CARE_PROVIDER_SITE_OTHER): Payer: Medicare Other | Admitting: Internal Medicine

## 2019-03-30 ENCOUNTER — Other Ambulatory Visit: Payer: Self-pay

## 2019-03-30 VITALS — BP 126/85 | HR 73 | Temp 97.6°F | Ht 66.0 in | Wt 292.0 lb

## 2019-03-30 DIAGNOSIS — Z0000 Encounter for general adult medical examination without abnormal findings: Secondary | ICD-10-CM | POA: Diagnosis not present

## 2019-03-30 DIAGNOSIS — R739 Hyperglycemia, unspecified: Secondary | ICD-10-CM | POA: Diagnosis not present

## 2019-03-30 DIAGNOSIS — Z23 Encounter for immunization: Secondary | ICD-10-CM

## 2019-03-30 DIAGNOSIS — I10 Essential (primary) hypertension: Secondary | ICD-10-CM

## 2019-03-30 DIAGNOSIS — E782 Mixed hyperlipidemia: Secondary | ICD-10-CM | POA: Diagnosis not present

## 2019-03-30 MED ORDER — TIZANIDINE HCL 2 MG PO TABS: 2.0000 mg | ORAL_TABLET | Freq: Every day | ORAL | 1 refills | Status: DC

## 2019-03-30 NOTE — Progress Notes (Signed)
Subjective:    Patient ID: [name], female    DOB: 06-10-1935, 83 y.o.   MRN: [id number]  HPI  Here for wellness and f/u;  Overall doing ok;  Pt denies Chest pain, worsening SOB, DOE, wheezing, orthopnea, PND, worsening LE edema, palpitations, dizziness or syncope.  Pt denies neurological change such as new headache, facial or extremity weakness.  Pt denies polydipsia, polyuria, or low sugar symptoms. Pt states overall good compliance with treatment and medications, good tolerability, and has been trying to follow appropriate diet.  Pt denies worsening depressive symptoms, suicidal ideation or panic. No fever, night sweats, wt loss, loss of appetite, or other constitutional symptoms.  Pt states good ability with ADL's, has low fall risk, home safety reviewed and adequate, no other significant changes in hearing or vision, and only occasionally active with exercise.  Tolerating the repatha well once monthly, now off zetia, copay fairly reasonable now. Warnell Forester is falling out more, but no other significant problems.  Walks with walker at home, always uses the wheelchair outside the home to insure no falling.  Allergist switched her advair to [place].  Had recent tizanidine for muscle relaxer and helps the neck pain - only uses 1-2 times per wk, asks for refill. Good compliacne with CPAP and hx f/u next k for OSA.   Past Medical History:  Diagnosis Date  . Allergy   . Anxiety   . Arthritis    no cartlidge in knees  . Asthma   . Bradycardia    a. atenolol stopped due to this, HR 40s.  . 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 CAD in native artery    a.  CAD s/p RCA stent 2000 with residual mild-mod disease of left system 2001.  Marland Kitchen Chronic atrial fibrillation ([place])   . COPD (chronic obstructive pulmonary disease) ([place])   . Essential hypertension 03/24/2015  . Gout    post operative  . Hx of radiation therapy 04/02/11 -05/20/11   left breast  . Hyperlipidemia   . Hypothyroidism  03/24/2015  . Incontinence of urine   . Malignant neoplasm of upper-outer quadrant of female breast ([place]) 12/26/2010  . Osteoporosis 03/24/2015  . Peripheral neuropathy 03/24/2015  . Plantar fasciitis   . Sleep apnea    Past Surgical History:  Procedure Laterality Date  . BREAST LUMPECTOMY W/ NEEDLE LOCALIZATION  01/29/2011   Left with [facility] Dr Margot Chimes  . CHOLECYSTECTOMY  1955  . CORONARY ANGIOPLASTY WITH STENT PLACEMENT  2000  . DILATION AND CURETTAGE OF UTERUS  1976   and 1996  . HAND SURGERY  1992   tendons between thumb and forefinger  . SKIN BIOPSY     1994, 2006, 2008, 2010, 2011, 2011, 2012-  pre-cancerous  . TONSILLECTOMY  1955    reports that she quit smoking about 56 years ago. She has a 4.00 pack-year smoking history. She has never used smokeless tobacco. She reports that she does not drink alcohol or use drugs. family history includes Asthma in her sister; Cancer in her father; Heart attack in her brother and father; Heart disease in her brother and father. Allergies  Allergen Reactions  . Crestor [Rosuvastatin Calcium]     Severe liver function problems  . Hydrocodone-Acetaminophen Anxiety and Itching  . Lidocaine Hcl Other (See Comments)    Novacaine:  Becomes very shaky  . Lipitor [Atorvastatin Calcium] Other (See Comments)    Elevated liver function   . Procaine Other (  See Comments)    shaking  . Rosuvastatin Other (See Comments)    Severe liver function problems  . Theophylline Other (See Comments)    Becomes very shaky skaking skaking  . Vicodin [Hydrocodone-Acetaminophen] Itching    Itching all over  . Codeine Nausea Only, Anxiety and Other (See Comments)    Also complained of dizziness.  Has same problems with oxycodone and hydrocodone Visual Disturbance, Balance Difficulty, Dizzy "Room Spinning; "Swimming" Balance, vision, nausea, dizzy, "swimming", "room spinning" Balance, vision, nausea, dizzy, "swimming", "room spinning"  . Oxycodone-Acetaminophen  Nausea Only, Anxiety and Other (See Comments)    Visual Disturbance, Balance Difficulty, Dizzy "Room Spinning; "Swimming"  . Propoxyphene Anxiety, Nausea Only and Other (See Comments)    Visual Disturbance, Balance Difficulty, Dizzy "Room Spinning; "Swimming" Balance, vision, nausea, dizzy, "swimming", "room spinning" Balance, vision, nausea, dizzy, "swimming, "room spinning"  . Quinine Derivatives Nausea Only    dizzy  . Sulfa Antibiotics Nausea Only    Also complained of dizziness  . Atorvastatin Other (See Comments)    Severe liver function problems  . Ephedrine Other (See Comments)    shaking  . Other Other (See Comments)    Quinine causes nausea, dizzy  . Oxycodone Other (See Comments)    Balance, vision, nausea, dizzy, "swimming", "room spinning"  . Cephalexin Rash  . Epinephrine Other (See Comments)    Patient becomes "shaky" shaking shaking  . Penicillins Rash    Pt reported all over body rash in 1958 Has patient had a PCN reaction causing immediate rash, facial/tongue/throat swelling, SOB or lightheadedness with hypotension:Yes Has patient had a PCN reaction causing severe rash involving mucus membranes or skin necrosis: No Has patient had a PCN reaction that required hospitalization: No Has patient had a PCN reaction occurring within the last 10 years:No    . Theophyllines Other (See Comments)    Patient becomes "shaky"   Current Outpatient Medications on File Prior to Visit  Medication Sig Dispense Refill  . albuterol (PROAIR HFA) 108 (90 Base) MCG/ACT inhaler INHALE 2 PUFFS BY MOUTH INTO THE LUNGS IF NEEDED FOR WHEEZING OR SHORTNESS OF BREATH 8.5 g 2  . albuterol (PROVENTIL) (2.5 MG/3ML) 0.083% nebulizer solution Take 3 mLs (2.5 mg total) by nebulization every 6 (six) hours as needed for wheezing or shortness of breath. 75 mL 12  . allopurinol (ZYLOPRIM) 300 MG tablet Take 300 mg by mouth daily.  1  . ALPRAZolam (XANAX) 0.5 MG tablet TAKE 1 TO 2 TABLETS BY MOUTH  TWICE DAILY IF NEEDED FOR ANXIETY/SLEEP 60 tablet 3  . azelastine (ASTELIN) 0.1 % nasal spray Place 2 sprays into both nostrils daily. Use in each nostril as directed    . azelastine (OPTIVAR) 0.05 % ophthalmic solution Place 1 drop into both eyes 2 (two) times daily. 6 mL 12  . B Complex Vitamins (VITAMIN B COMPLEX PO) Take 1 tablet by mouth daily.    . Biotin 5000 MCG CAPS Take 5,000 mcg by mouth daily.     . cholecalciferol (VITAMIN D) 1000 UNITS tablet Take 1,000 Units by mouth daily.    Marland Kitchen co-enzyme Q-10 50 MG capsule Take 400 mg by mouth daily.    . colchicine (COLCRYS) 0.6 MG tablet Take 0.6 mg by mouth daily as needed (flare  up).     . Cyanocobalamin (VITAMIN B-12 PO) Take 1 tablet by mouth daily.    . cycloSPORINE (RESTASIS) 0.05 % ophthalmic emulsion Apply to eye as needed.    . diclofenac sodium (  VOLTAREN) 1 % GEL Apply 4 g topically 4 (four) times daily as needed. 200 g 11  . EQL Natural Zinc 50 MG TABS Take 50 mg by mouth daily.    Marland Kitchen escitalopram (LEXAPRO) 10 MG tablet Take 1 tablet (10 mg total) by mouth daily. 90 tablet 1  . Evolocumab with Infusor ([place]) 420 MG/3.5ML SOCT Inject 1 Units into the skin every 30 (thirty) days. 1 Cartridge 11  . fexofenadine (RA ALLERGY RELIEF) 180 MG tablet Take 1 tablet (180 mg total) by mouth daily. 90 tablet 1  . gabapentin (NEURONTIN) 300 MG capsule Take 1 capsule (300 mg total) by mouth 3 (three) times daily. (Patient taking differently: Take 300 mg by mouth daily. ) 270 capsule 1  . hydrochlorothiazide (HYDRODIURIL) 25 MG tablet TAKE 2 TABLETS BY MOUTH ONCE DAILY 180 tablet 1  . levocetirizine (XYZAL) 5 MG tablet TAKE 1 TABLET(5 MG) BY MOUTH AT BEDTIME 30 tablet 5  . levothyroxine (SYNTHROID) 75 MCG tablet TAKE 1 TABLET(75 MCG) BY MOUTH DAILY 90 tablet 1  . metoprolol succinate (TOPROL-XL) 25 MG 24 hr tablet Take 0.5 tablets (12.5 mg total) by mouth daily. 45 tablet 2  . montelukast (SINGULAIR) 10 MG tablet Take 1 tablet  (10 mg total) by mouth at bedtime. 90 tablet 1  . mupirocin ointment (BACTROBAN) 2 % Use twice a day as directed 30 g 1  . nystatin cream (MYCOSTATIN) Apply 1 application topically daily as needed for dry skin.   0  . potassium chloride (K-DUR) 10 MEQ tablet Take 4 tablets (40 mEq total) by mouth daily. 360 tablet 3  . predniSONE (DELTASONE) 10 MG tablet 3 tabs by mouth per day for 3 days,2tabs per day for 3 days,1tab per day for 3 days 18 tablet 0  . Pyridoxine HCl (VITAMIN B-6 PO) Take 1 tablet by mouth daily.    . rivaroxaban (XARELTO) 20 MG TABS tablet TAKE 1 TABLET(20 MG) BY MOUTH DAILY WITH SUPPER 90 tablet 0  . rosuvastatin (CRESTOR) 10 MG tablet Take 1 tablet by mouth twice a week. 27 tablet 3  . Saccharomyces boulardii (FLORASTOR PO) Take 2 capsules by mouth daily.    Marland Kitchen tiZANidine (ZANAFLEX) 2 MG tablet Take 2-4 mg by mouth at bedtime as needed.    . traMADol-acetaminophen (ULTRACET) 37.5-325 MG tablet Take 1-2 tablets by mouth every 6 (six) hours as needed for moderate pain or severe pain.   0  . triamcinolone (NASACORT AQ) 55 MCG/ACT AERO nasal inhaler Place 2 sprays daily into the nose. 1 Inhaler 12  . triamcinolone cream (KENALOG) 0.1 % Apply 1 application topically 2 (two) times daily. For 2 weeks, then as needed 80 g 2  . vitamin C (ASCORBIC ACID) 500 MG tablet Take 500 mg by mouth daily.    Marland Kitchen VITAMIN E PO Take 1 tablet by mouth daily.    Grant Ruts INHUB 500-50 MCG/DOSE AEPB 1 puff 2 (two) times daily.     No current facility-administered medications on file prior to visit.    Review of Systems Constitutional: Negative for other unusual diaphoresis, sweats, appetite or weight changes HENT: Negative for other worsening hearing loss, ear pain, facial swelling, mouth sores or neck stiffness.   Eyes: Negative for other worsening pain, redness or other visual disturbance.  Respiratory: Negative for other stridor or swelling Cardiovascular: Negative for other palpitations or other  chest pain  Gastrointestinal: Negative for worsening diarrhea or loose stools, blood in stool, distention or other pain Genitourinary:  Negative for hematuria, flank pain or other change in urine volume.  Musculoskeletal: Negative for myalgias or other joint swelling.  Skin: Negative for other color change, or other wound or worsening drainage.  Neurological: Negative for other syncope or numbness. Hematological: Negative for other adenopathy or swelling Psychiatric/Behavioral: Negative for hallucinations, other worsening agitation, SI, self-injury, or new decreased concentration All otherwise neg per pt     Objective:   Physical Exam BP 126/85   Pulse 73   Temp 97.6 F (36.4 C) (Oral)   Ht '5\' 6"'$  (1.676 m)   Wt 292 lb (132.5 kg)   SpO2 98%   BMI 47.13 kg/m  VS noted,  Constitutional: Pt is oriented to person, place, and time. Appears well-developed and well-nourished, in no significant distress and comfortable Head: Normocephalic and atraumatic  Eyes: Conjunctivae and EOM are normal. Pupils are equal, round, and reactive to light Right Ear: External ear normal without discharge Left Ear: External ear normal without discharge Nose: Nose without discharge or deformity Mouth/Throat: Oropharynx is without other ulcerations and moist  Neck: Normal range of motion. Neck supple. No JVD present. No tracheal deviation present or significant neck LA or mass Cardiovascular: Normal rate, regular rhythm, normal heart sounds and intact distal pulses.   Pulmonary/Chest: WOB normal and breath sounds without rales or wheezing  Abdominal: Soft. Bowel sounds are normal. NT. No HSM  Musculoskeletal: Normal range of motion. Exhibits no edema Lymphadenopathy: Has no other cervical adenopathy.  Neurological: Pt is alert and oriented to person, place, and time. Pt has normal reflexes. No cranial nerve deficit. Motor grossly intact, Gait intact Skin: Skin is warm and dry. No rash noted or new ulcerations  Psychiatric:  Has normal mood and affect. Behavior is normal without agitation All otherwise neg per pt  Lab Results  Component Value Date   WBC 5.5 03/26/2019   HGB 13.4 03/26/2019   HCT 41.0 03/26/2019   PLT 134.0 (L) 03/26/2019   GLUCOSE 106 (H) 03/26/2019   CHOL 128 03/26/2019   TRIG 79.0 03/26/2019   HDL 57.00 03/26/2019   LDLDIRECT 107.0 09/17/2016   LDLCALC 55 03/26/2019   ALT 15 03/26/2019   AST 20 03/26/2019   NA 141 03/26/2019   K 3.9 03/26/2019   CL 103 03/26/2019   CREATININE 0.79 03/26/2019   BUN 22 03/26/2019   CO2 30 03/26/2019   TSH 3.55 03/26/2019   HGBA1C 5.9 03/26/2019      Assessment & Plan:

## 2019-03-30 NOTE — Patient Instructions (Addendum)
You had the flu shot today  Please continue all other medications as before, and refills have been done if requested.  Please have the pharmacy call with any other refills you may need.  Please continue your efforts at being more active, low cholesterol diet, and weight control.  You are otherwise up to date with prevention measures today.  Please keep your appointments with your specialists as you may have planned  Please return in 6 months, or sooner if needed, with Lab testing done 3-5 days before  

## 2019-04-03 ENCOUNTER — Encounter: Payer: Self-pay | Admitting: Internal Medicine

## 2019-04-03 NOTE — Assessment & Plan Note (Signed)
stable overall by history and exam, recent data reviewed with pt, and pt to continue medical treatment as before,  to f/u any worsening symptoms or concerns  

## 2019-04-03 NOTE — Assessment & Plan Note (Signed)

## 2019-04-12 ENCOUNTER — Other Ambulatory Visit: Payer: Self-pay | Admitting: Pharmacist

## 2019-04-12 MED ORDER — REPATHA PUSHTRONEX SYSTEM 420 MG/3.5ML ~~LOC~~ SOCT: 1.0000 [IU] | SUBCUTANEOUS | 11 refills | Status: DC

## 2019-04-26 ENCOUNTER — Ambulatory Visit: Payer: Self-pay

## 2019-04-26 ENCOUNTER — Telehealth: Payer: Self-pay | Admitting: *Deleted

## 2019-04-26 NOTE — Telephone Encounter (Signed)
  Patient called stating that over the weekend her asthma has gotten bad.. she states she used inhalers and a nebulizer which kept her up and made her gittery. She states that today her upper chest feels real tight and she can not take a deep breath in. She has no other symptom No fever.  She states that it is just she and her husband and they order everything delivered.  Per protocol patient was told to go to ER for evaluation. Care advice read to patient.  She refused and the office was called on her behalf. Per office they will contact patient. Patient was made aware.  Reason for Disposition . [1] MODERATE difficulty breathing (e.g., speaks in phrases, SOB even at rest, pulse 100-120) AND [2] NEW-onset or WORSE than normal  Answer Assessment - Initial Assessment Questions 1. RESPIRATORY STATUS: "Describe your breathing?" (e.g., wheezing, shortness of breath, unable to speak, severe coughing)      SOB 2. ONSET: "When did this breathing problem begin?"     This weekend 3. PATTERN "Does the difficult breathing come and go, or has it been constant since it started?"     constant 4. SEVERITY: "How bad is your breathing?" (e.g., mild, moderate, severe)    - MILD: No SOB at rest, mild SOB with walking, speaks normally in sentences, can lay down, no retractions, pulse < 100.    - MODERATE: SOB at rest, SOB with minimal exertion and prefers to sit, cannot lie down flat, speaks in phrases, mild retractions, audible wheezing, pulse 100-120.    - SEVERE: Very SOB at rest, speaks in single words, struggling to breathe, sitting hunched forward, retractions, pulse > 120     moderate 5. RECURRENT SYMPTOM: "Have you had difficulty breathing before?" If so, ask: "When was the last time?" and "What happened that time?"      Yes comes from allergies and was well in control for several years 6. CARDIAC HISTORY: "Do you have any history of heart disease?" (e.g., heart attack, angina, bypass surgery, angioplasty)    No Stent in 2000 see cardiologist A Fib 7. LUNG HISTORY: "Do you have any history of lung disease?"  (e.g., pulmonary embolus, asthma, emphysema)     asthma 8. CAUSE: "What do you think is causing the breathing problem?"     asthma 9. OTHER SYMPTOMS: "Do you have any other symptoms? (e.g., dizziness, runny nose, cough, chest pain, fever)    no 10. PREGNANCY: "Is there any chance you are pregnant?" "When was your last menstrual period?"     N/A 11. TRAVEL: "Have you traveled out of the country in the last month?" (e.g., travel history, exposures)     N/A  Protocols used: BREATHING DIFFICULTY-A-AH

## 2019-04-26 NOTE — Telephone Encounter (Signed)
[place] for appt as long as no fever or URI symptosm

## 2019-04-26 NOTE — Telephone Encounter (Signed)
Triage Nurse-- talk to pt having breathing problem and refused to go to ED. Called pt back---stated having deep breathing problem, tightness in chest--started yesterday. Denied fever and refused to go to ER and wanted to make an appointment to see Dr. Jenny Reichmann. Notified pt to go to ED if getting any worse. Please advise

## 2019-04-27 ENCOUNTER — Ambulatory Visit (INDEPENDENT_AMBULATORY_CARE_PROVIDER_SITE_OTHER): Payer: Medicare Other | Admitting: Internal Medicine

## 2019-04-27 ENCOUNTER — Ambulatory Visit (INDEPENDENT_AMBULATORY_CARE_PROVIDER_SITE_OTHER)
Admission: RE | Admit: 2019-04-27 | Discharge: 2019-04-27 | Disposition: A | Discharge status: Home / Self Care | Payer: Medicare Other | Source: Ambulatory Visit | Attending: Internal Medicine | Admitting: Internal Medicine

## 2019-04-27 ENCOUNTER — Encounter: Payer: Self-pay | Admitting: Internal Medicine

## 2019-04-27 ENCOUNTER — Other Ambulatory Visit: Payer: Self-pay

## 2019-04-27 VITALS — BP 134/80 | HR 67 | Temp 98.7°F | Ht 66.0 in | Wt 295.0 lb

## 2019-04-27 DIAGNOSIS — R05 Cough: Secondary | ICD-10-CM | POA: Diagnosis not present

## 2019-04-27 DIAGNOSIS — F329 Major depressive disorder, single episode, unspecified: Secondary | ICD-10-CM | POA: Diagnosis not present

## 2019-04-27 DIAGNOSIS — J4531 Mild persistent asthma with (acute) exacerbation: Secondary | ICD-10-CM | POA: Diagnosis not present

## 2019-04-27 DIAGNOSIS — I10 Essential (primary) hypertension: Secondary | ICD-10-CM | POA: Diagnosis not present

## 2019-04-27 DIAGNOSIS — F419 Anxiety disorder, unspecified: Secondary | ICD-10-CM

## 2019-04-27 MED ORDER — PREDNISONE 10 MG PO TABS: ORAL_TABLET | ORAL | 0 refills | Status: DC

## 2019-04-27 MED ORDER — METHYLPREDNISOLONE ACETATE 80 MG/ML IJ SUSP
80.0000 mg | Freq: Once | INTRAMUSCULAR | Status: AC
  Administered 2019-04-27: 80 mg via INTRAMUSCULAR

## 2019-04-27 MED ORDER — LEVALBUTEROL TARTRATE 45 MCG/ACT IN AERO: 2.0000 | INHALATION_SPRAY | Freq: Four times a day (QID) | RESPIRATORY_TRACT | 12 refills | Status: DC | PRN

## 2019-04-27 NOTE — Patient Instructions (Addendum)
Ok to change the albuterol to Xopenex inhaler  You had the steroid shot today  Please take all new medication as prescribed - the prednisone  Please continue all other medications as before, and refills have been done if requested.  Please have the pharmacy call with any other refills you may need.  Please continue your efforts at being more active, low cholesterol diet, and weight control.  Please keep your appointments with your specialists as you may have planned  Please go to the XRAY Department in the first floor for the x-ray testing  You will be contacted by phone if any changes need to be made immediately.  Otherwise, you will receive a letter about your results with an explanation, but please check with MyChart first.  Please remember to sign up for MyChart if you have not done so, as this will be important to you in the future with finding out test results, communicating by private email, and scheduling acute appointments online when needed.

## 2019-04-27 NOTE — Progress Notes (Signed)
Subjective:    Patient ID: [name], female    DOB: [birth year], 83 y.o.   MRN: [id number]  HPI Here to f/u; overall doing ok,  Pt denies chest pain, orthopnea, PND, increased LE swelling, palpitations, dizziness or syncope, but has had 3 days onset mild worsening sob, cough and wheezing after being exposed to leaves being raked in the yard all day.  Pt denies new neurological symptoms such as new headache, or facial or extremity weakness or numbness.  Pt denies polydipsia, polyuria, or low sugar episode.  Pt states overall good compliance with meds, mostly trying to follow appropriate diet.  Albuterol MDI leads to increased tremulousness.   Pt denies fever, wt loss, night sweats, loss of appetite, or other constitutional symptoms.  Denies worsening depressive symptoms, suicidal ideation, or panic Past Medical History:  Diagnosis Date  . Allergy   . Anxiety   . Arthritis    no cartlidge in knees  . Asthma   . Bradycardia    a. atenolol stopped due to this, HR 40s.  . 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 CAD in native artery    a.  CAD s/p RCA stent 2000 with residual mild-mod disease of left system 2001.  Marland Kitchen Chronic atrial fibrillation ([place])   . COPD (chronic obstructive pulmonary disease) ([place])   . Essential hypertension 03/24/2015  . Gout    post operative  . Hx of radiation therapy 04/02/11 -05/20/11   left breast  . Hyperlipidemia   . Hypothyroidism 03/24/2015  . Incontinence of urine   . Malignant neoplasm of upper-outer quadrant of female breast ([place]) 12/26/2010  . Osteoporosis 03/24/2015  . Peripheral neuropathy 03/24/2015  . Plantar fasciitis   . Sleep apnea    Past Surgical History:  Procedure Laterality Date  . BREAST LUMPECTOMY W/ NEEDLE LOCALIZATION  01/29/2011   Left with [facility] Dr Margot Chimes  . CHOLECYSTECTOMY  1955  . CORONARY ANGIOPLASTY WITH STENT PLACEMENT  2000  . DILATION AND CURETTAGE OF UTERUS  1976   and 1996  . HAND SURGERY  1992   tendons between thumb and forefinger  . SKIN BIOPSY     1994, 2006, 2008, 2010, 2011, 2011, 2012-  pre-cancerous  . TONSILLECTOMY  1955    reports that she quit smoking about 56 years ago. She has a 4.00 pack-year smoking history. She has never used smokeless tobacco. She reports that she does not drink alcohol or use drugs. family history includes Asthma in her sister; Cancer in her father; Heart attack in her brother and father; Heart disease in her brother and father. Allergies  Allergen Reactions  . Crestor [Rosuvastatin Calcium]     Severe liver function problems  . Hydrocodone-Acetaminophen Anxiety and Itching  . Lidocaine Hcl Other (See Comments)    Novacaine:  Becomes very shaky  . Lipitor [Atorvastatin Calcium] Other (See Comments)    Elevated liver function   . Procaine Other (See Comments)    shaking  . Rosuvastatin Other (See Comments)    Severe liver function problems  . Theophylline Other (See Comments)    Becomes very shaky skaking skaking  . Vicodin [Hydrocodone-Acetaminophen] Itching    Itching all over  . Codeine Nausea Only, Anxiety and Other (See Comments)    Also complained of dizziness.  Has same problems with oxycodone and hydrocodone Visual Disturbance, Balance Difficulty, Dizzy "Room Spinning; "Swimming" Balance, vision, nausea, dizzy, "swimming", "room spinning" Balance, vision, nausea, dizzy, "swimming", "room  spinning"  . Oxycodone-Acetaminophen Nausea Only, Anxiety and Other (See Comments)    Visual Disturbance, Balance Difficulty, Dizzy "Room Spinning; "Swimming"  . Propoxyphene Anxiety, Nausea Only and Other (See Comments)    Visual Disturbance, Balance Difficulty, Dizzy "Room Spinning; "Swimming" Balance, vision, nausea, dizzy, "swimming", "room spinning" Balance, vision, nausea, dizzy, "swimming, "room spinning"  . Quinine Derivatives Nausea Only    dizzy  . Sulfa Antibiotics Nausea Only    Also complained of dizziness  . Atorvastatin Other  (See Comments)    Severe liver function problems  . Ephedrine Other (See Comments)    shaking  . Other Other (See Comments)    Quinine causes nausea, dizzy  . Oxycodone Other (See Comments)    Balance, vision, nausea, dizzy, "swimming", "room spinning"  . Cephalexin Rash  . Epinephrine Other (See Comments)    Patient becomes "shaky" shaking shaking  . Penicillins Rash    Pt reported all over body rash in 1958 Has patient had a PCN reaction causing immediate rash, facial/tongue/throat swelling, SOB or lightheadedness with hypotension:Yes Has patient had a PCN reaction causing severe rash involving mucus membranes or skin necrosis: No Has patient had a PCN reaction that required hospitalization: No Has patient had a PCN reaction occurring within the last 10 years:No    . Theophyllines Other (See Comments)    Patient becomes "shaky"   Current Outpatient Medications on File Prior to Visit  Medication Sig Dispense Refill  . albuterol (PROAIR HFA) 108 (90 Base) MCG/ACT inhaler INHALE 2 PUFFS BY MOUTH INTO THE LUNGS IF NEEDED FOR WHEEZING OR SHORTNESS OF BREATH 8.5 g 2  . allopurinol (ZYLOPRIM) 300 MG tablet Take 300 mg by mouth daily.  1  . ALPRAZolam (XANAX) 0.5 MG tablet TAKE 1 TO 2 TABLETS BY MOUTH TWICE DAILY IF NEEDED FOR ANXIETY/SLEEP 60 tablet 3  . azelastine (ASTELIN) 0.1 % nasal spray Place 2 sprays into both nostrils daily. Use in each nostril as directed    . azelastine (OPTIVAR) 0.05 % ophthalmic solution Place 1 drop into both eyes 2 (two) times daily. 6 mL 12  . B Complex Vitamins (VITAMIN B COMPLEX PO) Take 1 tablet by mouth daily.    . Biotin 5000 MCG CAPS Take 5,000 mcg by mouth daily.     . cholecalciferol (VITAMIN D) 1000 UNITS tablet Take 1,000 Units by mouth daily.    Marland Kitchen co-enzyme Q-10 50 MG capsule Take 400 mg by mouth daily.    . colchicine (COLCRYS) 0.6 MG tablet Take 0.6 mg by mouth daily as needed (flare  up).     . Cyanocobalamin (VITAMIN B-12 PO) Take 1 tablet  by mouth daily.    . cycloSPORINE (RESTASIS) 0.05 % ophthalmic emulsion Apply to eye as needed.    . diclofenac sodium (VOLTAREN) 1 % GEL Apply 4 g topically 4 (four) times daily as needed. 200 g 11  . EQL Natural Zinc 50 MG TABS Take 50 mg by mouth daily.    Marland Kitchen escitalopram (LEXAPRO) 10 MG tablet Take 1 tablet (10 mg total) by mouth daily. 90 tablet 1  . Evolocumab with Infusor (Morral) 420 MG/3.5ML SOCT Inject 1 Units into the skin every 30 (thirty) days. 3.5 mL 11  . fexofenadine (RA ALLERGY RELIEF) 180 MG tablet Take 1 tablet (180 mg total) by mouth daily. 90 tablet 1  . gabapentin (NEURONTIN) 300 MG capsule Take 1 capsule (300 mg total) by mouth 3 (three) times daily. (Patient taking differently: Take 300 mg  by mouth daily. ) 270 capsule 1  . levocetirizine (XYZAL) 5 MG tablet TAKE 1 TABLET(5 MG) BY MOUTH AT BEDTIME 30 tablet 5  . levothyroxine (SYNTHROID) 75 MCG tablet TAKE 1 TABLET(75 MCG) BY MOUTH DAILY 90 tablet 1  . metoprolol succinate (TOPROL-XL) 25 MG 24 hr tablet Take 0.5 tablets (12.5 mg total) by mouth daily. 45 tablet 2  . montelukast (SINGULAIR) 10 MG tablet Take 1 tablet (10 mg total) by mouth at bedtime. 90 tablet 1  . mupirocin ointment (BACTROBAN) 2 % Use twice a day as directed 30 g 1  . nystatin cream (MYCOSTATIN) Apply 1 application topically daily as needed for dry skin.   0  . potassium chloride (K-DUR) 10 MEQ tablet Take 4 tablets (40 mEq total) by mouth daily. 360 tablet 3  . Pyridoxine HCl (VITAMIN B-6 PO) Take 1 tablet by mouth daily.    . rivaroxaban (XARELTO) 20 MG TABS tablet TAKE 1 TABLET(20 MG) BY MOUTH DAILY WITH SUPPER 90 tablet 0  . rosuvastatin (CRESTOR) 10 MG tablet Take 1 tablet by mouth twice a week. 27 tablet 3  . Saccharomyces boulardii (FLORASTOR PO) Take 2 capsules by mouth daily.    Marland Kitchen tiZANidine (ZANAFLEX) 2 MG tablet Take 1 tablet (2 mg total) by mouth at bedtime. 90 tablet 1  . traMADol-acetaminophen (ULTRACET) 37.5-325 MG tablet  Take 1-2 tablets by mouth every 6 (six) hours as needed for moderate pain or severe pain.   0  . triamcinolone (NASACORT AQ) 55 MCG/ACT AERO nasal inhaler Place 2 sprays daily into the nose. 1 Inhaler 12  . triamcinolone cream (KENALOG) 0.1 % Apply 1 application topically 2 (two) times daily. For 2 weeks, then as needed 80 g 2  . vitamin C (ASCORBIC ACID) 500 MG tablet Take 500 mg by mouth daily.    Marland Kitchen VITAMIN E PO Take 1 tablet by mouth daily.    Grant Ruts INHUB 500-50 MCG/DOSE AEPB 1 puff 2 (two) times daily.    Marland Kitchen albuterol (PROVENTIL) (2.5 MG/3ML) 0.083% nebulizer solution Take 3 mLs (2.5 mg total) by nebulization every 6 (six) hours as needed for wheezing or shortness of breath. (Patient not taking: Reported on 04/27/2019) 75 mL 12   No current facility-administered medications on file prior to visit.   Review of Systems  Constitutional: Negative for other unusual diaphoresis or sweats HENT: Negative for ear discharge or swelling Eyes: Negative for other worsening visual disturbances Respiratory: Negative for stridor or other swelling  Gastrointestinal: Negative for worsening distension or other blood Genitourinary: Negative for retention or other urinary change Musculoskeletal: Negative for other MSK pain or swelling Skin: Negative for color change or other new lesions Neurological: Negative for worsening tremors and other numbness  Psychiatric/Behavioral: Negative for worsening agitation or other fatigue All otherwise neg per pt     Objective:   Physical Exam BP 134/80 (BP Location: Left Arm)   Pulse 67   Temp 98.7 F (37.1 C) (Oral)   Ht '5\' 6"'$  (1.676 m)   Wt 295 lb (133.8 kg)   SpO2 96%   BMI 47.61 kg/m  VS noted,  Constitutional: Pt appears in NAD HENT: Head: NCAT.  Right Ear: External ear normal.  Left Ear: External ear normal.  Eyes: . Pupils are equal, round, and reactive to light. Conjunctivae and EOM are normal Nose: without d/c or deformity Neck: Neck supple.  Gross normal ROM Cardiovascular: Normal rate and regular rhythm.   Pulmonary/Chest: Effort normal and breath sounds without rales  or wheezing.  Abd:  Soft, NT, ND, + BS, no organomegaly Neurological: Pt is alert. At baseline orientation, motor grossly intact Skin: Skin is warm. No rashes, other new lesions, no LE edema Psychiatric: Pt behavior is normal without agitation  All otherwise neg per pt Lab Results  Component Value Date   WBC 5.5 03/26/2019   HGB 13.4 03/26/2019   HCT 41.0 03/26/2019   PLT 134.0 (L) 03/26/2019   GLUCOSE 106 (H) 03/26/2019   CHOL 128 03/26/2019   TRIG 79.0 03/26/2019   HDL 57.00 03/26/2019   LDLDIRECT 107.0 09/17/2016   LDLCALC 55 03/26/2019   ALT 15 03/26/2019   AST 20 03/26/2019   NA 141 03/26/2019   K 3.9 03/26/2019   CL 103 03/26/2019   CREATININE 0.79 03/26/2019   BUN 22 03/26/2019   CO2 30 03/26/2019   TSH 3.55 03/26/2019   HGBA1C 5.9 03/26/2019      Assessment & Plan:

## 2019-05-05 ENCOUNTER — Other Ambulatory Visit: Payer: Self-pay | Admitting: Internal Medicine

## 2019-05-05 MED ORDER — HYDROCHLOROTHIAZIDE 25 MG PO TABS: 50.0000 mg | ORAL_TABLET | Freq: Every day | ORAL | 1 refills | Status: DC

## 2019-05-05 NOTE — Telephone Encounter (Signed)
Copied from [place] [id number]. Topic: Quick Communication - Rx Refill/Question >> May 05, 2019  1:58 PM Mcneil, Ja-Kwan wrote: Medication: hydrochlorothiazide (HYDRODIURIL) 25 MG tablet  Has the patient contacted their pharmacy? yes   Preferred Pharmacy (with phone number or street name): [id number] - [address], [address] AT [address] [place]  Phone: [phone]  Fax: [phone]  Agent: Please be advised that RX refills may take up to 3 business days. We ask that you follow-up with your pharmacy.

## 2019-05-05 NOTE — Telephone Encounter (Signed)
Requested Prescriptions  Pending Prescriptions Disp Refills  . hydrochlorothiazide (HYDRODIURIL) 25 MG tablet 180 tablet 1    Sig: Take 2 tablets (50 mg total) by mouth daily.     Cardiovascular: Diuretics - Thiazide Passed - 05/05/2019  2:33 PM      Passed - Ca in normal range and within 360 days    Calcium  Date Value Ref Range Status  03/26/2019 9.6 8.4 - 10.5 mg/dL Final  06/27/2016 9.7 8.4 - 10.4 mg/dL Final   Calcium, Ion  Date Value Ref Range Status  09/15/2017 1.15 1.15 - 1.40 mmol/L Final         Passed - Cr in normal range and within 360 days    Creatinine  Date Value Ref Range Status  06/27/2016 0.8 0.6 - 1.1 mg/dL Final   Creatinine, Ser  Date Value Ref Range Status  03/26/2019 0.79 0.40 - 1.20 mg/dL Final         Passed - K in normal range and within 360 days    Potassium  Date Value Ref Range Status  03/26/2019 3.9 3.5 - 5.1 mEq/L Final  06/27/2016 4.0 3.5 - 5.1 mEq/L Final         Passed - Na in normal range and within 360 days    Sodium  Date Value Ref Range Status  03/26/2019 141 135 - 145 mEq/L Final  06/27/2016 143 136 - 145 mEq/L Final         Passed - Last BP in normal range    BP Readings from Last 1 Encounters:  04/27/19 134/80         Passed - Valid encounter within last 6 months    Recent Outpatient Visits          1 month ago Flu vaccine need   [place], James W, MD   5 months ago Mild persistent asthma with exacerbation   [place], James W, MD   7 months ago Encounter for well adult exam with abnormal findings   [place], James W, MD   1 year ago Preventative health care   [facility] -Georges Mouse, MD   1 year ago Left leg cellulitis   [place], Marvis Repress, [place]      Future Appointments            In 4 months Jenny Reichmann, Hunt Oris, MD [place] at [facility]

## 2019-05-06 DIAGNOSIS — D3131 Benign neoplasm of right choroid: Secondary | ICD-10-CM | POA: Diagnosis not present

## 2019-05-06 DIAGNOSIS — H35373 Puckering of macula, bilateral: Secondary | ICD-10-CM | POA: Diagnosis not present

## 2019-05-06 DIAGNOSIS — H35013 Changes in retinal vascular appearance, bilateral: Secondary | ICD-10-CM | POA: Diagnosis not present

## 2019-05-06 DIAGNOSIS — H40013 Open angle with borderline findings, low risk, bilateral: Secondary | ICD-10-CM | POA: Diagnosis not present

## 2019-05-06 DIAGNOSIS — G4733 Obstructive sleep apnea (adult) (pediatric): Secondary | ICD-10-CM | POA: Diagnosis not present

## 2019-05-09 ENCOUNTER — Encounter: Payer: Self-pay | Admitting: Internal Medicine

## 2019-05-09 NOTE — Assessment & Plan Note (Addendum)
Mild to mod, Ok to change the albuterol to Xopenex inhaler  You had the steroid shot today - depomedrol IM 80 and predpac asd  Please take all new medication as prescribed - the prednisone, and  to f/u any worsening symptoms or concerns, and cxr today

## 2019-05-09 NOTE — Assessment & Plan Note (Signed)
stable overall by history and exam, recent data reviewed with pt, and pt to continue medical treatment as before,  to f/u any worsening symptoms or concerns  

## 2019-05-17 ENCOUNTER — Encounter: Payer: Self-pay | Admitting: Internal Medicine

## 2019-05-24 ENCOUNTER — Other Ambulatory Visit: Payer: Self-pay | Admitting: Physician Assistant

## 2019-05-25 ENCOUNTER — Encounter: Payer: Self-pay | Admitting: Internal Medicine

## 2019-06-02 ENCOUNTER — Ambulatory Visit: Payer: Medicare Other

## 2019-06-07 ENCOUNTER — Other Ambulatory Visit: Payer: Self-pay | Admitting: Interventional Cardiology

## 2019-06-07 MED ORDER — RIVAROXABAN 20 MG PO TABS: ORAL_TABLET | ORAL | 1 refills | Status: DC

## 2019-06-07 NOTE — Telephone Encounter (Signed)
Pt last saw Melina Copa, PA on 03/09/19, last labs 03/26/19 Creat 0.79, age 84, weight 133.8kg, CrCl 117.97, based on CrCl pt is on appropriate dosage of Xarelto 20mg  QD.  Will refill rx.

## 2019-06-09 ENCOUNTER — Other Ambulatory Visit: Payer: Self-pay

## 2019-06-09 ENCOUNTER — Other Ambulatory Visit: Payer: Medicare Other | Admitting: *Deleted

## 2019-06-09 DIAGNOSIS — R001 Bradycardia, unspecified: Secondary | ICD-10-CM | POA: Diagnosis not present

## 2019-06-09 DIAGNOSIS — E785 Hyperlipidemia, unspecified: Secondary | ICD-10-CM

## 2019-06-09 DIAGNOSIS — I2510 Atherosclerotic heart disease of native coronary artery without angina pectoris: Secondary | ICD-10-CM | POA: Diagnosis not present

## 2019-06-09 DIAGNOSIS — I482 Chronic atrial fibrillation, unspecified: Secondary | ICD-10-CM | POA: Diagnosis not present

## 2019-06-09 LAB — HEPATIC FUNCTION PANEL
ALT: 12 IU/L (ref 0–32)
AST: 16 IU/L (ref 0–40)
Albumin: 3.7 g/dL (ref 3.6–4.6)
Alkaline Phosphatase: 72 IU/L (ref 39–117)
Bilirubin Total: 0.8 mg/dL (ref 0.0–1.2)
Bilirubin, Direct: 0.23 mg/dL (ref 0.00–0.40)
Total Protein: 5.8 g/dL — ABNORMAL LOW (ref 6.0–8.5)

## 2019-06-09 LAB — LIPID PANEL
Chol/HDL Ratio: 2.3 ratio (ref 0.0–4.4)
Cholesterol, Total: 133 mg/dL (ref 100–199)
HDL: 57 mg/dL (ref >39)
LDL Chol Calc (NIH): 57 mg/dL (ref 0–99)
Triglycerides: 105 mg/dL (ref 0–149)
VLDL Cholesterol Cal: 19 mg/dL (ref 5–40)

## 2019-06-11 ENCOUNTER — Ambulatory Visit: Payer: Medicare Other | Attending: Internal Medicine

## 2019-06-11 DIAGNOSIS — Z23 Encounter for immunization: Secondary | ICD-10-CM

## 2019-06-11 NOTE — Progress Notes (Signed)
   Covid-19 Vaccination Clinic  Name:  NILAH PAPALIA    MRN: [id number] DOB: Feb 04, 1936  06/11/2019  Ms. Cata was observed post Covid-19 immunization for 15 minutes without incidence. She was provided with Vaccine Information Sheet and instruction to access the V-Safe system.   Ms. Draves was instructed to call 911 with any severe reactions post vaccine: Marland Kitchen Difficulty breathing  . Swelling of your face and throat  . A fast heartbeat  . A bad rash all over your body  . Dizziness and weakness    Immunizations Administered    Name Date Dose VIS Date Route   Pfizer COVID-19 Vaccine 06/11/2019  9:55 AM 0.3 mL 04/16/2019 Intramuscular   Manufacturer: [place]   Lot: [id number]   [place]: [id number]

## 2019-06-15 ENCOUNTER — Telehealth: Payer: Self-pay | Admitting: Physician Assistant

## 2019-06-15 NOTE — Telephone Encounter (Signed)
-----   Message from Charlie Pitter, PA-C sent at 06/10/2019  7:52 AM EST ----- Please let pt know Lipids still look great off the Zetia. Protein level slightly low in the blood (5.8, normal 6.0) but albumin is normal so I have no acute concerns about this. Just be sure to maintain adequate nutrition and have routine f/u with primary care. Dayna Dunn PA-C

## 2019-06-15 NOTE — Telephone Encounter (Signed)
New message ° ° °Patient is returning call for lab results. Please call. °

## 2019-06-15 NOTE — Telephone Encounter (Signed)
Pt returned my call and she has been made aware of her lab results 

## 2019-06-24 ENCOUNTER — Encounter: Payer: Self-pay | Admitting: Internal Medicine

## 2019-06-24 MED ORDER — LEVOCETIRIZINE DIHYDROCHLORIDE 5 MG PO TABS: 5.0000 mg | ORAL_TABLET | Freq: Every evening | ORAL | 11 refills | Status: DC

## 2019-07-02 ENCOUNTER — Telehealth: Payer: Self-pay

## 2019-07-02 ENCOUNTER — Other Ambulatory Visit: Payer: Self-pay | Admitting: Internal Medicine

## 2019-07-02 NOTE — Telephone Encounter (Signed)
Patient called and spoke with [facility] on  07/01/2019 2:20:01 PM  States for the last few days she has had congestion. Today she has fatigue and tightness in her chest. She has asthma and just used her inhaler about 5 min ago and it has started to help. She has gotten her first COVID vaccine Therapist, music) and has not wanted to get steroids because she was afraid it would keep her from getting her 2nd dose.   Advised HOME CARE: * You should be able to treat this at home. * Continue the quick-relief asthma medicine until you have not wheezed or coughed for 48 hours. It takes a minimum of 7 days of medicine for lung function to return to normal. ASTHMA QUICK-RELIEF MEDICINE: * Start your quick-relief medicine (e.g., albuterol, salbutamol) at the first sign of any coughing or shortness of breath (don't wait for wheezing). * Use inhaler (2 puffs each time) or nebulizer every 4 hours. * The best 'cough medicine' for an adult with asthma is always the asthma medicine. NOTE: Don't use cough suppressants, but COUGH DROPS may help a tickly cough. DRINKING FLUIDS AND USING A HUMIDIFIER: * Drink a normal amount of liquids (e.g., water). Being adequately hydrated makes it easier to cough up the sticky lung mucus. * If the air is dry, use a humidifier to prevent drying of the upper airway. HAY FEVER: If you have symptoms from hay fever, it's OK to take antihistamines (Reasons: poor control of allergic rhinitis makes asthma worse whereas antihistamines don't make asthma worse). ASTHMA LONG-TERM-CONTROL MEDICINE: If you are using a controller medicine (e.g., inhaled steroids or cromolyn), continue to take it as directed. CALL BACK IF: * Wheezing is not improved after neb or inhaler * Inhaled asthma medicine (neb or MDI) is needed more often than every 4 hours * Wheezing is not completely cleared by 5 days * You become worse. CARE ADVICE given per Asthma Attack (Adult) guideline.   Patient has a follow up on  07/05/2019 for asthma.

## 2019-07-05 ENCOUNTER — Other Ambulatory Visit: Payer: Self-pay

## 2019-07-05 ENCOUNTER — Encounter: Payer: Self-pay | Admitting: Internal Medicine

## 2019-07-05 ENCOUNTER — Ambulatory Visit (INDEPENDENT_AMBULATORY_CARE_PROVIDER_SITE_OTHER): Payer: Medicare Other | Admitting: Internal Medicine

## 2019-07-05 ENCOUNTER — Ambulatory Visit (INDEPENDENT_AMBULATORY_CARE_PROVIDER_SITE_OTHER): Payer: Medicare Other

## 2019-07-05 VITALS — BP 122/78 | HR 68 | Temp 98.5°F | Ht 66.0 in

## 2019-07-05 DIAGNOSIS — F329 Major depressive disorder, single episode, unspecified: Secondary | ICD-10-CM

## 2019-07-05 DIAGNOSIS — I10 Essential (primary) hypertension: Secondary | ICD-10-CM

## 2019-07-05 DIAGNOSIS — F419 Anxiety disorder, unspecified: Secondary | ICD-10-CM

## 2019-07-05 DIAGNOSIS — R739 Hyperglycemia, unspecified: Secondary | ICD-10-CM

## 2019-07-05 DIAGNOSIS — R062 Wheezing: Secondary | ICD-10-CM | POA: Diagnosis not present

## 2019-07-05 DIAGNOSIS — J45909 Unspecified asthma, uncomplicated: Secondary | ICD-10-CM

## 2019-07-05 DIAGNOSIS — R0600 Dyspnea, unspecified: Secondary | ICD-10-CM | POA: Diagnosis not present

## 2019-07-05 DIAGNOSIS — F32A Depression, unspecified: Secondary | ICD-10-CM

## 2019-07-05 MED ORDER — PREDNISONE 10 MG PO TABS: ORAL_TABLET | ORAL | 0 refills | Status: DC

## 2019-07-05 MED ORDER — TIZANIDINE HCL 2 MG PO TABS: 2.0000 mg | ORAL_TABLET | Freq: Every day | ORAL | 1 refills | Status: DC

## 2019-07-05 MED ORDER — LEVALBUTEROL TARTRATE 45 MCG/ACT IN AERO: 2.0000 | INHALATION_SPRAY | Freq: Four times a day (QID) | RESPIRATORY_TRACT | 3 refills | Status: DC | PRN

## 2019-07-05 MED ORDER — METHYLPREDNISOLONE ACETATE 80 MG/ML IJ SUSP
20.0000 mg | Freq: Once | INTRAMUSCULAR | Status: AC
  Administered 2019-07-05: 20 mg via INTRAMUSCULAR

## 2019-07-05 NOTE — Progress Notes (Signed)
Subjective:    Patient ID: [name], female    DOB: [birth year], 84 y.o.   MRN: [id number]  HPI  Here with 2 days onset mild wheezing sob similar to last visit, mild, intermittent but ? Mild worsening overall, better with inhaler but having to use numerous times per day.   Pt denies fever, wt loss, night sweats, loss of appetite, or other constitutional symptoms  Pt denies chest pain, orthopnea, PND, increased LE swelling, palpitations, dizziness or syncope. Due for COVID shot #2 soon.  Pt denies new neurological symptoms such as new headache, or facial or extremity weakness or numbness   Pt denies polydipsia, polyuria.  .Denies worsening depressive symptoms, suicidal ideation, or panic Past Medical History:  Diagnosis Date  . Allergy   . Anxiety   . Arthritis    no cartlidge in knees  . Asthma   . Bradycardia    a. atenolol stopped due to this, HR 40s.  . 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 CAD in native artery    a.  CAD s/p RCA stent 2000 with residual mild-mod disease of left system 2001.  Marland Kitchen Chronic atrial fibrillation ([place])   . COPD (chronic obstructive pulmonary disease) ([place])   . Essential hypertension 03/24/2015  . Gout    post operative  . Hx of radiation therapy 04/02/11 -05/20/11   left breast  . Hyperlipidemia   . Hypothyroidism 03/24/2015  . Incontinence of urine   . Malignant neoplasm of upper-outer quadrant of female breast ([place]) 12/26/2010  . Osteoporosis 03/24/2015  . Peripheral neuropathy 03/24/2015  . Plantar fasciitis   . Sleep apnea    Past Surgical History:  Procedure Laterality Date  . BREAST LUMPECTOMY W/ NEEDLE LOCALIZATION  01/29/2011   Left with [facility] Dr Margot Chimes  . CHOLECYSTECTOMY  1955  . CORONARY ANGIOPLASTY WITH STENT PLACEMENT  2000  . DILATION AND CURETTAGE OF UTERUS  1976   and 1996  . HAND SURGERY  1992   tendons between thumb and forefinger  . SKIN BIOPSY     1994, 2006, 2008, 2010, 2011, 2011, 2012-  pre-cancerous    . TONSILLECTOMY  1955    reports that she quit smoking about 56 years ago. She has a 4.00 pack-year smoking history. She has never used smokeless tobacco. She reports that she does not drink alcohol or use drugs. family history includes Asthma in her sister; Cancer in her father; Heart attack in her brother and father; Heart disease in her brother and father. Allergies  Allergen Reactions  . Crestor [Rosuvastatin Calcium]     Severe liver function problems  . Hydrocodone-Acetaminophen Anxiety and Itching  . Lidocaine Hcl Other (See Comments)    Novacaine:  Becomes very shaky  . Lipitor [Atorvastatin Calcium] Other (See Comments)    Elevated liver function   . Procaine Other (See Comments)    shaking  . Rosuvastatin Other (See Comments)    Severe liver function problems  . Theophylline Other (See Comments)    Becomes very shaky skaking skaking  . Vicodin [Hydrocodone-Acetaminophen] Itching    Itching all over  . Codeine Nausea Only, Anxiety and Other (See Comments)    Also complained of dizziness.  Has same problems with oxycodone and hydrocodone Visual Disturbance, Balance Difficulty, Dizzy "Room Spinning; "Swimming" Balance, vision, nausea, dizzy, "swimming", "room spinning" Balance, vision, nausea, dizzy, "swimming", "room spinning"  . Oxycodone-Acetaminophen Nausea Only, Anxiety and Other (See Comments)    Visual  Disturbance, Balance Difficulty, Dizzy "Room Spinning; "Swimming"  . Propoxyphene Anxiety, Nausea Only and Other (See Comments)    Visual Disturbance, Balance Difficulty, Dizzy "Room Spinning; "Swimming" Balance, vision, nausea, dizzy, "swimming", "room spinning" Balance, vision, nausea, dizzy, "swimming, "room spinning"  . Quinine Derivatives Nausea Only    dizzy  . Sulfa Antibiotics Nausea Only    Also complained of dizziness  . Atorvastatin Other (See Comments)    Severe liver function problems  . Ephedrine Other (See Comments)    shaking  . Other Other (See  Comments)    Quinine causes nausea, dizzy  . Oxycodone Other (See Comments)    Balance, vision, nausea, dizzy, "swimming", "room spinning"  . Cephalexin Rash  . Epinephrine Other (See Comments)    Patient becomes "shaky" shaking shaking  . Penicillins Rash    Pt reported all over body rash in 1958 Has patient had a PCN reaction causing immediate rash, facial/tongue/throat swelling, SOB or lightheadedness with hypotension:Yes Has patient had a PCN reaction causing severe rash involving mucus membranes or skin necrosis: No Has patient had a PCN reaction that required hospitalization: No Has patient had a PCN reaction occurring within the last 10 years:No    . Theophyllines Other (See Comments)    Patient becomes "shaky"   Current Outpatient Medications on File Prior to Visit  Medication Sig Dispense Refill  . albuterol (PROVENTIL) (2.5 MG/3ML) 0.083% nebulizer solution Take 3 mLs (2.5 mg total) by nebulization every 6 (six) hours as needed for wheezing or shortness of breath. 75 mL 12  . albuterol (VENTOLIN HFA) 108 (90 Base) MCG/ACT inhaler INHALE 2 PUFFS BY MOUTH INTO THE LUNGS IF NEEDED FOR WHEEZING AND OR SHORTNESS OF BREATH 8.5 g 2  . allopurinol (ZYLOPRIM) 300 MG tablet Take 300 mg by mouth daily.  1  . ALPRAZolam (XANAX) 0.5 MG tablet TAKE 1 TO 2 TABLETS BY MOUTH TWICE DAILY IF NEEDED FOR ANXIETY/SLEEP 60 tablet 3  . azelastine (ASTELIN) 0.1 % nasal spray Place 2 sprays into both nostrils daily. Use in each nostril as directed    . azelastine (OPTIVAR) 0.05 % ophthalmic solution Place 1 drop into both eyes 2 (two) times daily. 6 mL 12  . B Complex Vitamins (VITAMIN B COMPLEX PO) Take 1 tablet by mouth daily.    . Biotin 5000 MCG CAPS Take 5,000 mcg by mouth daily.     . cholecalciferol (VITAMIN D) 1000 UNITS tablet Take 1,000 Units by mouth daily.    Marland Kitchen co-enzyme Q-10 50 MG capsule Take 400 mg by mouth daily.    . colchicine (COLCRYS) 0.6 MG tablet Take 0.6 mg by mouth daily as  needed (flare  up).     . Cyanocobalamin (VITAMIN B-12 PO) Take 1 tablet by mouth daily.    . cycloSPORINE (RESTASIS) 0.05 % ophthalmic emulsion Apply to eye as needed.    . diclofenac sodium (VOLTAREN) 1 % GEL Apply 4 g topically 4 (four) times daily as needed. 200 g 11  . EQL Natural Zinc 50 MG TABS Take 50 mg by mouth daily.    Marland Kitchen escitalopram (LEXAPRO) 10 MG tablet Take 1 tablet (10 mg total) by mouth daily. 90 tablet 1  . Evolocumab with Infusor ([place]) 420 MG/3.5ML SOCT Inject 1 Units into the skin every 30 (thirty) days. 3.5 mL 11  . fexofenadine (RA ALLERGY RELIEF) 180 MG tablet Take 1 tablet (180 mg total) by mouth daily. 90 tablet 1  . gabapentin (NEURONTIN) 300 MG capsule  Take 1 capsule (300 mg total) by mouth 3 (three) times daily. (Patient taking differently: Take 300 mg by mouth daily. ) 270 capsule 1  . hydrochlorothiazide (HYDRODIURIL) 25 MG tablet Take 2 tablets (50 mg total) by mouth daily. 180 tablet 1  . levocetirizine (XYZAL) 5 MG tablet Take 1 tablet (5 mg total) by mouth every evening. 30 tablet 11  . levothyroxine (SYNTHROID) 75 MCG tablet TAKE 1 TABLET(75 MCG) BY MOUTH DAILY 90 tablet 1  . metoprolol succinate (TOPROL-XL) 25 MG 24 hr tablet TAKE 1/2 TABLET(12.5 MG) BY MOUTH DAILY 45 tablet 3  . montelukast (SINGULAIR) 10 MG tablet Take 1 tablet (10 mg total) by mouth at bedtime. 90 tablet 1  . mupirocin ointment (BACTROBAN) 2 % Use twice a day as directed 30 g 1  . nystatin cream (MYCOSTATIN) Apply 1 application topically daily as needed for dry skin.   0  . potassium chloride (K-DUR) 10 MEQ tablet Take 4 tablets (40 mEq total) by mouth daily. 360 tablet 3  . Pyridoxine HCl (VITAMIN B-6 PO) Take 1 tablet by mouth daily.    . rivaroxaban (XARELTO) 20 MG TABS tablet TAKE 1 TABLET(20 MG) BY MOUTH DAILY WITH SUPPER 90 tablet 1  . rosuvastatin (CRESTOR) 10 MG tablet Take 1 tablet by mouth twice a week. 27 tablet 3  . Saccharomyces boulardii (FLORASTOR PO)  Take 2 capsules by mouth daily.    . traMADol-acetaminophen (ULTRACET) 37.5-325 MG tablet Take 1-2 tablets by mouth every 6 (six) hours as needed for moderate pain or severe pain.   0  . triamcinolone (NASACORT AQ) 55 MCG/ACT AERO nasal inhaler Place 2 sprays daily into the nose. 1 Inhaler 12  . triamcinolone cream (KENALOG) 0.1 % Apply 1 application topically 2 (two) times daily. For 2 weeks, then as needed 80 g 2  . vitamin C (ASCORBIC ACID) 500 MG tablet Take 500 mg by mouth daily.    Marland Kitchen VITAMIN E PO Take 1 tablet by mouth daily.    Grant Ruts INHUB 500-50 MCG/DOSE AEPB 1 puff 2 (two) times daily.     No current facility-administered medications on file prior to visit.   Review of Systems All otherwise neg per pt     Objective:   Physical Exam BP 122/78   Pulse 68   Temp 98.5 F (36.9 C)   Ht '5\' 6"'$  (1.676 m)   SpO2 98%   BMI 47.61 kg/m  VS noted,  Constitutional: Pt appears in NAD HENT: Head: NCAT.  Right Ear: External ear normal.  Left Ear: External ear normal.  Eyes: . Pupils are equal, round, and reactive to light. Conjunctivae and EOM are normal Nose: without d/c or deformity Neck: Neck supple. Gross normal ROM Cardiovascular: Normal rate and regular rhythm.   Pulmonary/Chest: Effort normal and breath sounds without rales or wheezing.  Abd:  Soft, NT, ND, + BS, no organomegaly Neurological: Pt is alert. At baseline orientation, motor grossly intact Skin: Skin is warm. No rashes, other new lesions, no LE edema Psychiatric: Pt behavior is normal without agitation  All otherwise neg per pt Lab Results  Component Value Date   WBC 5.5 03/26/2019   HGB 13.4 03/26/2019   HCT 41.0 03/26/2019   PLT 134.0 (L) 03/26/2019   GLUCOSE 106 (H) 03/26/2019   CHOL 133 06/09/2019   TRIG 105 06/09/2019   HDL 57 06/09/2019   LDLDIRECT 107.0 09/17/2016   LDLCALC 57 06/09/2019   ALT 12 06/09/2019   AST 16 06/09/2019  NA 141 03/26/2019   K 3.9 03/26/2019   CL 103 03/26/2019    CREATININE 0.79 03/26/2019   BUN 22 03/26/2019   CO2 30 03/26/2019   TSH 3.55 03/26/2019   HGBA1C 5.9 03/26/2019      Assessment & Plan:

## 2019-07-05 NOTE — Assessment & Plan Note (Addendum)
stable overall by history and exam, recent data reviewed with pt, and pt to continue medical treatment as before,  to f/u any worsening symptoms or concerns  

## 2019-07-05 NOTE — Assessment & Plan Note (Addendum)
With mild exacerbation, for depomedrol 20 mg, predpac asd, cont xopenex and wixhela asd  I spent *31 minutes in preparing to see the patient by review of recent labs, imaging and procedures, obtaining and reviewing separately obtained history, communicating with the patient and family or caregiver, ordering medications, tests or procedures, and documenting clinical information in the EHR including the differential Dx, treatment, and any further evaluation and other management of asthma exacerbation, anxiety, htn, hyperglycemia

## 2019-07-05 NOTE — Assessment & Plan Note (Signed)
stable overall by history and exam, recent data reviewed with pt, and pt to continue medical treatment as before,  to f/u any worsening symptoms or concerns  

## 2019-07-05 NOTE — Patient Instructions (Signed)
You had the steroid shot (20 mg only) today  Please take all new medication as prescribed - the prednisone  The Xopenex inhaler was refilled today  Please continue all other medications as before, and refills have been done if requested.  Please have the pharmacy call with any other refills you may need.  Please continue your efforts at being more active, low cholesterol diet, and weight control.  You are otherwise up to date with prevention measures today.  Please keep your appointments with your specialists as you may have planned  Please go to the XRAY Department in the first floor for the x-ray testing  You will be contacted by phone if any changes need to be made immediately.  Otherwise, you will receive a letter about your results with an explanation, but please check with MyChart first.  Please remember to sign up for MyChart if you have not done so, as this will be important to you in the future with finding out test results, communicating by private email, and scheduling acute appointments online when needed.

## 2019-07-06 ENCOUNTER — Ambulatory Visit: Payer: Medicare Other | Attending: Internal Medicine

## 2019-07-06 DIAGNOSIS — Z23 Encounter for immunization: Secondary | ICD-10-CM | POA: Insufficient documentation

## 2019-07-06 NOTE — Progress Notes (Signed)
   Covid-19 Vaccination Clinic  Name:  [name]    MRN: [id number] DOB: [birth year]  07/06/2019  [name] was observed post Covid-19 immunization for 15 minutes without incident. She was provided with Vaccine Information Sheet and instruction to access the V-Safe system.   [name] was instructed to call 911 with any severe reactions post vaccine: Marland Kitchen Difficulty breathing  . Swelling of face and throat  . A fast heartbeat  . A bad rash all over body  . Dizziness and weakness   Immunizations Administered    Name Date Dose VIS Date Route   Pfizer COVID-19 Vaccine 07/06/2019 10:33 AM 0.3 mL 04/16/2019 Intramuscular   Manufacturer: [place]   Lot: [id number]   [place]: [id number]

## 2019-07-12 ENCOUNTER — Other Ambulatory Visit: Payer: Self-pay | Admitting: Internal Medicine

## 2019-07-13 ENCOUNTER — Encounter: Payer: Self-pay | Admitting: Internal Medicine

## 2019-07-14 MED ORDER — AZELASTINE HCL 0.1 % NA SOLN: 2.0000 | Freq: Every day | NASAL | 5 refills | Status: DC

## 2019-07-14 MED ORDER — ESCITALOPRAM OXALATE 10 MG PO TABS: ORAL_TABLET | ORAL | 3 refills | Status: DC

## 2019-07-19 ENCOUNTER — Encounter: Payer: Self-pay | Admitting: Internal Medicine

## 2019-07-19 MED ORDER — MONTELUKAST SODIUM 10 MG PO TABS: 10.0000 mg | ORAL_TABLET | Freq: Every day | ORAL | 1 refills | Status: DC

## 2019-07-24 ENCOUNTER — Encounter: Payer: Self-pay | Admitting: Internal Medicine

## 2019-08-02 DIAGNOSIS — M1712 Unilateral primary osteoarthritis, left knee: Secondary | ICD-10-CM | POA: Diagnosis not present

## 2019-08-02 DIAGNOSIS — M1711 Unilateral primary osteoarthritis, right knee: Secondary | ICD-10-CM | POA: Diagnosis not present

## 2019-08-05 DIAGNOSIS — H43813 Vitreous degeneration, bilateral: Secondary | ICD-10-CM | POA: Diagnosis not present

## 2019-08-05 DIAGNOSIS — H3091 Unspecified chorioretinal inflammation, right eye: Secondary | ICD-10-CM | POA: Diagnosis not present

## 2019-08-05 DIAGNOSIS — D3131 Benign neoplasm of right choroid: Secondary | ICD-10-CM | POA: Diagnosis not present

## 2019-08-05 DIAGNOSIS — H35373 Puckering of macula, bilateral: Secondary | ICD-10-CM | POA: Diagnosis not present

## 2019-08-16 DIAGNOSIS — M2550 Pain in unspecified joint: Secondary | ICD-10-CM | POA: Diagnosis not present

## 2019-08-16 DIAGNOSIS — M170 Bilateral primary osteoarthritis of knee: Secondary | ICD-10-CM | POA: Diagnosis not present

## 2019-08-16 DIAGNOSIS — Z79899 Other long term (current) drug therapy: Secondary | ICD-10-CM | POA: Diagnosis not present

## 2019-08-16 DIAGNOSIS — M1009 Idiopathic gout, multiple sites: Secondary | ICD-10-CM | POA: Diagnosis not present

## 2019-08-27 DIAGNOSIS — G4733 Obstructive sleep apnea (adult) (pediatric): Secondary | ICD-10-CM | POA: Diagnosis not present

## 2019-09-01 ENCOUNTER — Ambulatory Visit (INDEPENDENT_AMBULATORY_CARE_PROVIDER_SITE_OTHER): Payer: Medicare Other | Admitting: Internal Medicine

## 2019-09-01 ENCOUNTER — Encounter: Payer: Self-pay | Admitting: Internal Medicine

## 2019-09-01 ENCOUNTER — Other Ambulatory Visit: Payer: Self-pay

## 2019-09-01 VITALS — BP 146/82 | HR 67 | Temp 98.8°F | Ht 66.0 in

## 2019-09-01 DIAGNOSIS — J4531 Mild persistent asthma with (acute) exacerbation: Secondary | ICD-10-CM

## 2019-09-01 DIAGNOSIS — R739 Hyperglycemia, unspecified: Secondary | ICD-10-CM | POA: Diagnosis not present

## 2019-09-01 DIAGNOSIS — Z0000 Encounter for general adult medical examination without abnormal findings: Secondary | ICD-10-CM

## 2019-09-01 DIAGNOSIS — E538 Deficiency of other specified B group vitamins: Secondary | ICD-10-CM | POA: Diagnosis not present

## 2019-09-01 DIAGNOSIS — I10 Essential (primary) hypertension: Secondary | ICD-10-CM

## 2019-09-01 DIAGNOSIS — E559 Vitamin D deficiency, unspecified: Secondary | ICD-10-CM

## 2019-09-01 MED ORDER — METHYLPREDNISOLONE ACETATE 80 MG/ML IJ SUSP
80.0000 mg | Freq: Once | INTRAMUSCULAR | Status: AC
Start: 2019-09-01 — End: 2019-09-01
  Administered 2019-09-01: 80 mg via INTRAMUSCULAR

## 2019-09-01 MED ORDER — PREDNISONE 10 MG PO TABS: ORAL_TABLET | ORAL | 0 refills | Status: DC

## 2019-09-01 NOTE — Progress Notes (Signed)
Subjective:    Patient ID: [name], female    DOB: 03/26/1936, 84 y.o.   MRN: [id number]  HPI  Here to f/u; overall doing ok,  Pt denies chest pain, orthopnea, PND, increased LE swelling, palpitations, dizziness or syncope, but has 1 wk onset worsening sob doe wheezing c/w her asthma, having to use her inhaler more often, worse at night, no fever, after spending more time outdoors.  Pt denies new neurological symptoms such as new headache, or facial or extremity weakness or numbness.  Pt denies polydipsia, polyuria, or low sugar episode.  Pt states overall good compliance with meds, mostly trying to follow appropriate diet, with wt overall stable,  but little exercise however  BP Readings from Last 3 Encounters:  09/01/19 (!) 146/82  07/05/19 122/78  04/27/19 134/80   Past Medical History:  Diagnosis Date  . Allergy   . Anxiety   . Arthritis    no cartlidge in knees  . Asthma   . Bradycardia    a. atenolol stopped due to this, HR 40s.  . 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 CAD in native artery    a.  CAD s/p RCA stent 2000 with residual mild-mod disease of left system 2001.  Marland Kitchen Chronic atrial fibrillation ([place])   . COPD (chronic obstructive pulmonary disease) ([place])   . Essential hypertension 03/24/2015  . Gout    post operative  . Hx of radiation therapy 04/02/11 -05/20/11   left breast  . Hyperlipidemia   . Hypothyroidism 03/24/2015  . Incontinence of urine   . Malignant neoplasm of upper-outer quadrant of female breast ([place]) 12/26/2010  . Osteoporosis 03/24/2015  . Peripheral neuropathy 03/24/2015  . Plantar fasciitis   . Sleep apnea    Past Surgical History:  Procedure Laterality Date  . BREAST LUMPECTOMY W/ NEEDLE LOCALIZATION  01/29/2011   Left with [facility] Dr Margot Chimes  . CHOLECYSTECTOMY  1955  . CORONARY ANGIOPLASTY WITH STENT PLACEMENT  2000  . DILATION AND CURETTAGE OF UTERUS  1976   and 1996  . HAND SURGERY  1992   tendons between thumb and  forefinger  . SKIN BIOPSY     1994, 2006, 2008, 2010, 2011, 2011, 2012-  pre-cancerous  . TONSILLECTOMY  1955    reports that she quit smoking about 56 years ago. She has a 4.00 pack-year smoking history. She has never used smokeless tobacco. She reports that she does not drink alcohol or use drugs. family history includes Asthma in her sister; Cancer in her father; Heart attack in her brother and father; Heart disease in her brother and father. Allergies  Allergen Reactions  . Crestor [Rosuvastatin Calcium]     Severe liver function problems  . Hydrocodone-Acetaminophen Anxiety and Itching  . Lidocaine Hcl Other (See Comments)    Novacaine:  Becomes very shaky  . Lipitor [Atorvastatin Calcium] Other (See Comments)    Elevated liver function   . Procaine Other (See Comments)    shaking  . Rosuvastatin Other (See Comments)    Severe liver function problems  . Theophylline Other (See Comments)    Becomes very shaky skaking skaking  . Vicodin [Hydrocodone-Acetaminophen] Itching    Itching all over  . Codeine Nausea Only, Anxiety and Other (See Comments)    Also complained of dizziness.  Has same problems with oxycodone and hydrocodone Visual Disturbance, Balance Difficulty, Dizzy "Room Spinning; "Swimming" Balance, vision, nausea, dizzy, "swimming", "room spinning" Balance, vision, nausea,  dizzy, "swimming", "room spinning"  . Oxycodone-Acetaminophen Nausea Only, Anxiety and Other (See Comments)    Visual Disturbance, Balance Difficulty, Dizzy "Room Spinning; "Swimming"  . Propoxyphene Anxiety, Nausea Only and Other (See Comments)    Visual Disturbance, Balance Difficulty, Dizzy "Room Spinning; "Swimming" Balance, vision, nausea, dizzy, "swimming", "room spinning" Balance, vision, nausea, dizzy, "swimming, "room spinning"  . Quinine Derivatives Nausea Only    dizzy  . Sulfa Antibiotics Nausea Only    Also complained of dizziness  . Atorvastatin Other (See Comments)    Severe  liver function problems  . Ephedrine Other (See Comments)    shaking  . Other Other (See Comments)    Quinine causes nausea, dizzy  . Oxycodone Other (See Comments)    Balance, vision, nausea, dizzy, "swimming", "room spinning"  . Cephalexin Rash  . Epinephrine Other (See Comments)    Patient becomes "shaky" shaking shaking  . Penicillins Rash    Pt reported all over body rash in 1958 Has patient had a PCN reaction causing immediate rash, facial/tongue/throat swelling, SOB or lightheadedness with hypotension:Yes Has patient had a PCN reaction causing severe rash involving mucus membranes or skin necrosis: No Has patient had a PCN reaction that required hospitalization: No Has patient had a PCN reaction occurring within the last 10 years:No    . Theophyllines Other (See Comments)    Patient becomes "shaky"   Current Outpatient Medications on File Prior to Visit  Medication Sig Dispense Refill  . albuterol (PROVENTIL) (2.5 MG/3ML) 0.083% nebulizer solution Take 3 mLs (2.5 mg total) by nebulization every 6 (six) hours as needed for wheezing or shortness of breath. 75 mL 12  . albuterol (VENTOLIN HFA) 108 (90 Base) MCG/ACT inhaler INHALE 2 PUFFS BY MOUTH INTO THE LUNGS IF NEEDED FOR WHEEZING AND OR SHORTNESS OF BREATH 8.5 g 2  . allopurinol (ZYLOPRIM) 300 MG tablet Take 300 mg by mouth daily.  1  . ALPRAZolam (XANAX) 0.5 MG tablet TAKE 1 TO 2 TABLETS BY MOUTH TWICE DAILY IF NEEDED FOR ANXIETY/SLEEP 60 tablet 3  . azelastine (ASTELIN) 0.1 % nasal spray Place 2 sprays into both nostrils daily. Use in each nostril as directed 30 mL 5  . azelastine (OPTIVAR) 0.05 % ophthalmic solution Place 1 drop into both eyes 2 (two) times daily. 6 mL 12  . B Complex Vitamins (VITAMIN B COMPLEX PO) Take 1 tablet by mouth daily.    . Betamethasone Sodium Phosphate 6 MG/ML SOLN INJECT 2:4 SYRINGE BILATERALLY INTO EACH KNEE JOINT    . Biotin 5000 MCG CAPS Take 5,000 mcg by mouth daily.     . cholecalciferol  (VITAMIN D) 1000 UNITS tablet Take 1,000 Units by mouth daily.    Marland Kitchen co-enzyme Q-10 50 MG capsule Take 400 mg by mouth daily.    . colchicine (COLCRYS) 0.6 MG tablet Take 0.6 mg by mouth daily as needed (flare  up).     . Cyanocobalamin (VITAMIN B-12 PO) Take 1 tablet by mouth daily.    . cycloSPORINE (RESTASIS) 0.05 % ophthalmic emulsion Apply to eye as needed.    . diclofenac sodium (VOLTAREN) 1 % GEL Apply 4 g topically 4 (four) times daily as needed. 200 g 11  . EQL Natural Zinc 50 MG TABS Take 50 mg by mouth daily.    Marland Kitchen escitalopram (LEXAPRO) 10 MG tablet TAKE 1 TABLET(10 MG) BY MOUTH DAILY 90 tablet 3  . Evolocumab with Infusor ([place]) 420 MG/3.5ML SOCT Inject 1 Units into the skin  every 30 (thirty) days. 3.5 mL 11  . fexofenadine (RA ALLERGY RELIEF) 180 MG tablet Take 1 tablet (180 mg total) by mouth daily. 90 tablet 1  . gabapentin (NEURONTIN) 300 MG capsule Take 1 capsule (300 mg total) by mouth 3 (three) times daily. (Patient taking differently: Take 300 mg by mouth daily. ) 270 capsule 1  . hydrochlorothiazide (HYDRODIURIL) 25 MG tablet Take 2 tablets (50 mg total) by mouth daily. 180 tablet 1  . HYDROcodone-acetaminophen (NORCO/VICODIN) 5-325 MG tablet Take 1 tablet by mouth every 4 (four) hours as needed.    . levalbuterol (XOPENEX HFA) 45 MCG/ACT inhaler Inhale 2 puffs into the lungs every 6 (six) hours as needed for wheezing or shortness of breath. 3 Inhaler 3  . levocetirizine (XYZAL) 5 MG tablet Take 1 tablet (5 mg total) by mouth every evening. 30 tablet 11  . levothyroxine (SYNTHROID) 75 MCG tablet TAKE 1 TABLET(75 MCG) BY MOUTH DAILY 90 tablet 1  . metoprolol succinate (TOPROL-XL) 25 MG 24 hr tablet TAKE 1/2 TABLET(12.5 MG) BY MOUTH DAILY 45 tablet 3  . montelukast (SINGULAIR) 10 MG tablet Take 1 tablet (10 mg total) by mouth at bedtime. 90 tablet 1  . mupirocin ointment (BACTROBAN) 2 % Use twice a day as directed 30 g 1  . nystatin cream (MYCOSTATIN) Apply 1  application topically daily as needed for dry skin.   0  . potassium chloride (K-DUR) 10 MEQ tablet Take 4 tablets (40 mEq total) by mouth daily. 360 tablet 3  . Pyridoxine HCl (VITAMIN B-6 PO) Take 1 tablet by mouth daily.    . rivaroxaban (XARELTO) 20 MG TABS tablet TAKE 1 TABLET(20 MG) BY MOUTH DAILY WITH SUPPER 90 tablet 1  . rosuvastatin (CRESTOR) 10 MG tablet Take 1 tablet by mouth twice a week. 27 tablet 3  . Saccharomyces boulardii (FLORASTOR PO) Take 2 capsules by mouth daily.    Marland Kitchen tiZANidine (ZANAFLEX) 2 MG tablet Take 1 tablet (2 mg total) by mouth at bedtime. 90 tablet 1  . traMADol-acetaminophen (ULTRACET) 37.5-325 MG tablet Take 1-2 tablets by mouth every 6 (six) hours as needed for moderate pain or severe pain.   0  . triamcinolone (NASACORT AQ) 55 MCG/ACT AERO nasal inhaler Place 2 sprays daily into the nose. 1 Inhaler 12  . triamcinolone cream (KENALOG) 0.1 % Apply 1 application topically 2 (two) times daily. For 2 weeks, then as needed 80 g 2  . valACYclovir (VALTREX) 1000 MG tablet TAKE 2 TABLETS BY MOUTH TWICE DAILY FOR 1 DAY-TAKE DOSES 12 HOURS APART    . vitamin C (ASCORBIC ACID) 500 MG tablet Take 500 mg by mouth daily.    Marland Kitchen VITAMIN E PO Take 1 tablet by mouth daily.    Grant Ruts INHUB 500-50 MCG/DOSE AEPB 1 puff 2 (two) times daily.     No current facility-administered medications on file prior to visit.   Review of Systems All otherwise neg per pt    Objective:   Physical Exam BP (!) 146/82 (BP Location: Right Arm, Patient Position: Sitting, Cuff Size: Large)   Pulse 67   Temp 98.8 F (37.1 C) (Oral)   Ht '5\' 6"'$  (1.676 m)   SpO2 94%   BMI 47.61 kg/m  VS noted,  Constitutional: Pt appears in NAD HENT: Head: NCAT.  Right Ear: External ear normal.  Left Ear: External ear normal.  Eyes: . Pupils are equal, round, and reactive to light. Conjunctivae and EOM are normal Nose: without d/c or  deformity Neck: Neck supple. Gross normal ROM Cardiovascular: Normal rate  and regular rhythm.   Pulmonary/Chest: Effort normal and breath sounds decreased bilat without rales but with few exp/insp wheezing.  Abd:  Soft, NT, ND, + BS, no organomegaly Neurological: Pt is alert. At baseline orientation, motor grossly intact Skin: Skin is warm. No rashes, other new lesions, no LE edema Psychiatric: Pt behavior is normal without agitation  All otherwise neg per pt Lab Results  Component Value Date   WBC 5.5 03/26/2019   HGB 13.4 03/26/2019   HCT 41.0 03/26/2019   PLT 134.0 (L) 03/26/2019   GLUCOSE 106 (H) 03/26/2019   CHOL 133 06/09/2019   TRIG 105 06/09/2019   HDL 57 06/09/2019   LDLDIRECT 107.0 09/17/2016   LDLCALC 57 06/09/2019   ALT 12 06/09/2019   AST 16 06/09/2019   NA 141 03/26/2019   K 3.9 03/26/2019   CL 103 03/26/2019   CREATININE 0.79 03/26/2019   BUN 22 03/26/2019   CO2 30 03/26/2019   TSH 3.55 03/26/2019   HGBA1C 5.9 03/26/2019      Assessment & Plan:

## 2019-09-01 NOTE — Assessment & Plan Note (Signed)
stable overall by history and exam, recent data reviewed with pt, and pt to continue medical treatment as before,  to f/u any worsening symptoms or concerns  

## 2019-09-01 NOTE — Patient Instructions (Signed)
You had the steroid shot today  Please take all new medication as prescribed - the prednisone  Please continue all other medications as before, and refills have been done if requested.  Please have the pharmacy call with any other refills you may need.  Please continue your efforts at being more active, low cholesterol diet, and weight control.  Please keep your appointments with your specialists as you may have planned   

## 2019-09-01 NOTE — Assessment & Plan Note (Addendum)
stable overall by history and exam, recent data reviewed with pt, and pt to continue medical treatment as before,  to f/u any worsening symptoms or concerns  

## 2019-09-01 NOTE — Assessment & Plan Note (Addendum)
Mild to mod, for depomedrol IM 80, predpac asd, cont other tx, to f/u any worsening symptoms or concern  I spent 31 minutes in preparing to see the patient by review of recent labs, imaging and procedures, obtaining and reviewing separately obtained history, communicating with the patient and family or caregiver, ordering medications, tests or procedures, and documenting clinical information in the EHR including the differential Dx, treatment, and any further evaluation and other management of asthma exacerbation, hyperglycemia, htn

## 2019-09-14 ENCOUNTER — Encounter: Payer: Self-pay | Admitting: Internal Medicine

## 2019-09-14 MED ORDER — PREDNISONE 10 MG PO TABS: ORAL_TABLET | ORAL | 0 refills | Status: DC

## 2019-09-16 ENCOUNTER — Other Ambulatory Visit: Payer: Self-pay | Admitting: Internal Medicine

## 2019-09-20 ENCOUNTER — Other Ambulatory Visit: Payer: Self-pay | Admitting: Internal Medicine

## 2019-09-20 NOTE — Telephone Encounter (Signed)
Done erx 

## 2019-09-23 ENCOUNTER — Other Ambulatory Visit: Payer: Self-pay

## 2019-09-23 ENCOUNTER — Other Ambulatory Visit (INDEPENDENT_AMBULATORY_CARE_PROVIDER_SITE_OTHER): Payer: Medicare Other

## 2019-09-23 DIAGNOSIS — R739 Hyperglycemia, unspecified: Secondary | ICD-10-CM | POA: Diagnosis not present

## 2019-09-23 DIAGNOSIS — E559 Vitamin D deficiency, unspecified: Secondary | ICD-10-CM | POA: Diagnosis not present

## 2019-09-23 DIAGNOSIS — Z0000 Encounter for general adult medical examination without abnormal findings: Secondary | ICD-10-CM | POA: Diagnosis not present

## 2019-09-23 DIAGNOSIS — E538 Deficiency of other specified B group vitamins: Secondary | ICD-10-CM | POA: Diagnosis not present

## 2019-09-23 LAB — BASIC METABOLIC PANEL
BUN: 17 mg/dL (ref 6–23)
CO2: 31 mEq/L (ref 19–32)
Calcium: 9.5 mg/dL (ref 8.4–10.5)
Chloride: 103 mEq/L (ref 96–112)
Creatinine, Ser: 0.87 mg/dL (ref 0.40–1.20)
GFR: 62.32 mL/min (ref >60.00)
Glucose, Bld: 143 mg/dL — ABNORMAL HIGH (ref 70–99)
Potassium: 3.8 mEq/L (ref 3.5–5.1)
Sodium: 139 mEq/L (ref 135–145)

## 2019-09-23 LAB — LIPID PANEL
Cholesterol: 191 mg/dL (ref 0–200)
HDL: 67 mg/dL (ref >39.00)
LDL Cholesterol: 102 mg/dL — ABNORMAL HIGH (ref 0–99)
NonHDL: 124.13
Total CHOL/HDL Ratio: 3
Triglycerides: 109 mg/dL (ref 0.0–149.0)
VLDL: 21.8 mg/dL (ref 0.0–40.0)

## 2019-09-23 LAB — CBC WITH DIFFERENTIAL/PLATELET
Basophils Absolute: 0 10*3/uL (ref 0.0–0.1)
Basophils Relative: 0.5 % (ref 0.0–3.0)
Eosinophils Absolute: 0.1 10*3/uL (ref 0.0–0.7)
Eosinophils Relative: 2 % (ref 0.0–5.0)
HCT: 41.3 % (ref 36.0–46.0)
Hemoglobin: 13.8 g/dL (ref 12.0–15.0)
Lymphocytes Relative: 29.6 % (ref 12.0–46.0)
Lymphs Abs: 1.8 10*3/uL (ref 0.7–4.0)
MCHC: 33.4 g/dL (ref 30.0–36.0)
MCV: 99 fl (ref 78.0–100.0)
Monocytes Absolute: 0.6 10*3/uL (ref 0.1–1.0)
Monocytes Relative: 10.5 % (ref 3.0–12.0)
Neutro Abs: 3.4 10*3/uL (ref 1.4–7.7)
Neutrophils Relative %: 57.4 % (ref 43.0–77.0)
Platelets: 144 10*3/uL — ABNORMAL LOW (ref 150.0–400.0)
RBC: 4.17 Mil/uL (ref 3.87–5.11)
RDW: 15.2 % (ref 11.5–15.5)
WBC: 5.9 10*3/uL (ref 4.0–10.5)

## 2019-09-23 LAB — HEPATIC FUNCTION PANEL
ALT: 16 U/L (ref 0–35)
AST: 18 U/L (ref 0–37)
Albumin: 4.1 g/dL (ref 3.5–5.2)
Alkaline Phosphatase: 51 U/L (ref 39–117)
Bilirubin, Direct: 0.3 mg/dL (ref 0.0–0.3)
Total Bilirubin: 1.3 mg/dL — ABNORMAL HIGH (ref 0.2–1.2)
Total Protein: 6.4 g/dL (ref 6.0–8.3)

## 2019-09-23 LAB — HEMOGLOBIN A1C: Hgb A1c MFr Bld: 6.2 % (ref 4.6–6.5)

## 2019-09-23 LAB — TSH: TSH: 1.75 u[IU]/mL (ref 0.35–4.50)

## 2019-09-23 LAB — VITAMIN D 25 HYDROXY (VIT D DEFICIENCY, FRACTURES): VITD: 39.08 ng/mL (ref 30.00–100.00)

## 2019-09-23 LAB — VITAMIN B12: Vitamin B-12: 620 pg/mL (ref 211–911)

## 2019-09-24 ENCOUNTER — Other Ambulatory Visit: Payer: Self-pay | Admitting: Internal Medicine

## 2019-09-24 LAB — URINALYSIS, ROUTINE W REFLEX MICROSCOPIC
Bilirubin Urine: NEGATIVE
Ketones, ur: NEGATIVE
Nitrite: NEGATIVE
Specific Gravity, Urine: 1.015 (ref 1.000–1.030)
Total Protein, Urine: NEGATIVE
Urine Glucose: NEGATIVE
Urobilinogen, UA: 0.2 (ref 0.0–1.0)
pH: 7 (ref 5.0–8.0)

## 2019-09-24 MED ORDER — ROSUVASTATIN CALCIUM 20 MG PO TABS
20.0000 mg | ORAL_TABLET | Freq: Every day | ORAL | 3 refills | Status: DC
Start: 2019-09-24 — End: 2020-01-21

## 2019-09-29 ENCOUNTER — Ambulatory Visit: Payer: Medicare Other | Admitting: Internal Medicine

## 2019-09-30 ENCOUNTER — Encounter: Payer: Self-pay | Admitting: Internal Medicine

## 2019-09-30 ENCOUNTER — Ambulatory Visit (INDEPENDENT_AMBULATORY_CARE_PROVIDER_SITE_OTHER): Payer: Medicare Other | Admitting: Internal Medicine

## 2019-09-30 VITALS — BP 130/80 | HR 72 | Temp 98.7°F

## 2019-09-30 DIAGNOSIS — R739 Hyperglycemia, unspecified: Secondary | ICD-10-CM

## 2019-09-30 DIAGNOSIS — I482 Chronic atrial fibrillation, unspecified: Secondary | ICD-10-CM | POA: Diagnosis not present

## 2019-09-30 DIAGNOSIS — J4531 Mild persistent asthma with (acute) exacerbation: Secondary | ICD-10-CM | POA: Diagnosis not present

## 2019-09-30 DIAGNOSIS — E782 Mixed hyperlipidemia: Secondary | ICD-10-CM

## 2019-09-30 MED ORDER — METHYLPREDNISOLONE ACETATE 80 MG/ML IJ SUSP
80.0000 mg | Freq: Once | INTRAMUSCULAR | Status: AC
  Administered 2019-09-30: 80 mg via INTRAMUSCULAR

## 2019-09-30 MED ORDER — PREDNISONE 10 MG PO TABS: ORAL_TABLET | ORAL | 0 refills | Status: DC

## 2019-09-30 MED ORDER — LEVALBUTEROL TARTRATE 45 MCG/ACT IN AERO: 2.0000 | INHALATION_SPRAY | Freq: Four times a day (QID) | RESPIRATORY_TRACT | 3 refills | Status: AC | PRN

## 2019-09-30 MED ORDER — WIXELA INHUB 500-50 MCG/DOSE IN AEPB: 1.0000 | INHALATION_SPRAY | Freq: Two times a day (BID) | RESPIRATORY_TRACT | 11 refills | Status: DC

## 2019-09-30 NOTE — Assessment & Plan Note (Addendum)
Mild worsening for predpac asd, depomedrol 80 mg im, cont wixhela and xopenex, and consider f/u dr Lamonte Sakai  I spent 31 minutes in preparing to see the patient by review of recent labs, imaging and procedures, obtaining and reviewing separately obtained history, communicating with the patient and family or caregiver, ordering medications, tests or procedures, and documenting clinical information in the EHR including the differential Dx, treatment, and any further evaluation and other management of asthma exacerbation, afib, hyperglycemia, hLD

## 2019-09-30 NOTE — Assessment & Plan Note (Signed)
stable overall by history and exam, recent data reviewed with pt, and pt to continue medical treatment as before,  to f/u any worsening symptoms or concerns  

## 2019-09-30 NOTE — Progress Notes (Signed)
Subjective:    Patient ID: [name], female    DOB: 05/20/1935, 84 y.o.   MRN: [id number]  HPI here to f/u with co mild worsening wheezing, non prod cough, sob and doe without CP, fever and Pt denies orthopnea, PND, increased LE swelling, palpitations, dizziness or syncope.  Has long hx of asthma with sping and fall exacerbations, has done well recently for similar but now recurred as she spent more time outdoors.  Pt denies new neurological symptoms such as new headache, or facial or extremity weakness or numbness   Pt denies polydipsia, polyuria   Pt denies fever, wt loss, night sweats, loss of appetite, or other constitutional symptoms Past Medical History:  Diagnosis Date  . Allergy   . Anxiety   . Arthritis    no cartlidge in knees  . Asthma   . Bradycardia    a. atenolol stopped due to this, HR 40s.  . 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 CAD in native artery    a.  CAD s/p RCA stent 2000 with residual mild-mod disease of left system 2001.  Marland Kitchen Chronic atrial fibrillation ([place])   . COPD (chronic obstructive pulmonary disease) ([place])   . Essential hypertension 03/24/2015  . Gout    post operative  . Hx of radiation therapy 04/02/11 -05/20/11   left breast  . Hyperlipidemia   . Hypothyroidism 03/24/2015  . Incontinence of urine   . Malignant neoplasm of upper-outer quadrant of female breast ([place]) 12/26/2010  . Osteoporosis 03/24/2015  . Peripheral neuropathy 03/24/2015  . Plantar fasciitis   . Sleep apnea    Past Surgical History:  Procedure Laterality Date  . BREAST LUMPECTOMY W/ NEEDLE LOCALIZATION  01/29/2011   Left with [facility] Dr Margot Chimes  . CHOLECYSTECTOMY  1955  . CORONARY ANGIOPLASTY WITH STENT PLACEMENT  2000  . DILATION AND CURETTAGE OF UTERUS  1976   and 1996  . HAND SURGERY  1992   tendons between thumb and forefinger  . SKIN BIOPSY     1994, 2006, 2008, 2010, 2011, 2011, 2012-  pre-cancerous  . TONSILLECTOMY  1955    reports that she  quit smoking about 56 years ago. She has a 4.00 pack-year smoking history. She has never used smokeless tobacco. She reports that she does not drink alcohol or use drugs. family history includes Asthma in her sister; Cancer in her father; Heart attack in her brother and father; Heart disease in her brother and father. Allergies  Allergen Reactions  . Crestor [Rosuvastatin Calcium]     Severe liver function problems  . Hydrocodone-Acetaminophen Anxiety and Itching  . Lidocaine Hcl Other (See Comments)    Novacaine:  Becomes very shaky  . Lipitor [Atorvastatin Calcium] Other (See Comments)    Elevated liver function   . Procaine Other (See Comments)    shaking  . Rosuvastatin Other (See Comments)    Severe liver function problems  . Theophylline Other (See Comments)    Becomes very shaky skaking skaking  . Vicodin [Hydrocodone-Acetaminophen] Itching    Itching all over  . Codeine Nausea Only, Anxiety and Other (See Comments)    Also complained of dizziness.  Has same problems with oxycodone and hydrocodone Visual Disturbance, Balance Difficulty, Dizzy "Room Spinning; "Swimming" Balance, vision, nausea, dizzy, "swimming", "room spinning" Balance, vision, nausea, dizzy, "swimming", "room spinning"  . Oxycodone-Acetaminophen Nausea Only, Anxiety and Other (See Comments)    Visual Disturbance, Balance Difficulty, Dizzy "Room Spinning; "  Swimming"  . Propoxyphene Anxiety, Nausea Only and Other (See Comments)    Visual Disturbance, Balance Difficulty, Dizzy "Room Spinning; "Swimming" Balance, vision, nausea, dizzy, "swimming", "room spinning" Balance, vision, nausea, dizzy, "swimming, "room spinning"  . Quinine Derivatives Nausea Only    dizzy  . Sulfa Antibiotics Nausea Only    Also complained of dizziness  . Atorvastatin Other (See Comments)    Severe liver function problems  . Ephedrine Other (See Comments)    shaking  . Other Other (See Comments)    Quinine causes nausea, dizzy   . Oxycodone Other (See Comments)    Balance, vision, nausea, dizzy, "swimming", "room spinning"  . Cephalexin Rash  . Epinephrine Other (See Comments)    Patient becomes "shaky" shaking shaking  . Penicillins Rash    Pt reported all over body rash in 1958 Has patient had a PCN reaction causing immediate rash, facial/tongue/throat swelling, SOB or lightheadedness with hypotension:Yes Has patient had a PCN reaction causing severe rash involving mucus membranes or skin necrosis: No Has patient had a PCN reaction that required hospitalization: No Has patient had a PCN reaction occurring within the last 10 years:No    . Theophyllines Other (See Comments)    Patient becomes "shaky"   Current Outpatient Medications on File Prior to Visit  Medication Sig Dispense Refill  . albuterol (PROVENTIL) (2.5 MG/3ML) 0.083% nebulizer solution Take 3 mLs (2.5 mg total) by nebulization every 6 (six) hours as needed for wheezing or shortness of breath. 75 mL 12  . albuterol (VENTOLIN HFA) 108 (90 Base) MCG/ACT inhaler INHALE 2 PUFFS BY MOUTH INTO THE LUNGS IF NEEDED FOR WHEEZING AND OR SHORTNESS OF BREATH 8.5 g 2  . allopurinol (ZYLOPRIM) 300 MG tablet Take 300 mg by mouth daily.  1  . ALPRAZolam (XANAX) 0.5 MG tablet TAKE 1 TO 2 TABLETS BY MOUTH TWICE DAILY AS NEEDED FOR ANXIETY OR SLEEP 60 tablet 2  . azelastine (ASTELIN) 0.1 % nasal spray Place 2 sprays into both nostrils daily. Use in each nostril as directed 30 mL 5  . azelastine (OPTIVAR) 0.05 % ophthalmic solution Place 1 drop into both eyes 2 (two) times daily. 6 mL 12  . B Complex Vitamins (VITAMIN B COMPLEX PO) Take 1 tablet by mouth daily.    . Betamethasone Sodium Phosphate 6 MG/ML SOLN INJECT 2:4 SYRINGE BILATERALLY INTO EACH KNEE JOINT    . Biotin 5000 MCG CAPS Take 5,000 mcg by mouth daily.     . cholecalciferol (VITAMIN D) 1000 UNITS tablet Take 1,000 Units by mouth daily.    Marland Kitchen co-enzyme Q-10 50 MG capsule Take 400 mg by mouth daily.    .  colchicine (COLCRYS) 0.6 MG tablet Take 0.6 mg by mouth daily as needed (flare  up).     . Cyanocobalamin (VITAMIN B-12 PO) Take 1 tablet by mouth daily.    . cycloSPORINE (RESTASIS) 0.05 % ophthalmic emulsion Apply to eye as needed.    . diclofenac sodium (VOLTAREN) 1 % GEL Apply 4 g topically 4 (four) times daily as needed. 200 g 11  . EQL Natural Zinc 50 MG TABS Take 50 mg by mouth daily.    Marland Kitchen escitalopram (LEXAPRO) 10 MG tablet TAKE 1 TABLET(10 MG) BY MOUTH DAILY 90 tablet 3  . Evolocumab with Infusor ([place]) 420 MG/3.5ML SOCT Inject 1 Units into the skin every 30 (thirty) days. 3.5 mL 11  . fexofenadine (ALLEGRA) 180 MG tablet TAKE 1 TABLET BY MOUTH EVERY DAY 90  tablet 1  . gabapentin (NEURONTIN) 300 MG capsule Take 1 capsule (300 mg total) by mouth 3 (three) times daily. (Patient taking differently: Take 300 mg by mouth daily. ) 270 capsule 1  . hydrochlorothiazide (HYDRODIURIL) 25 MG tablet Take 2 tablets (50 mg total) by mouth daily. 180 tablet 1  . HYDROcodone-acetaminophen (NORCO/VICODIN) 5-325 MG tablet Take 1 tablet by mouth every 4 (four) hours as needed.    Marland Kitchen levocetirizine (XYZAL) 5 MG tablet Take 1 tablet (5 mg total) by mouth every evening. 30 tablet 11  . levothyroxine (SYNTHROID) 75 MCG tablet TAKE 1 TABLET(75 MCG) BY MOUTH DAILY 90 tablet 1  . metoprolol succinate (TOPROL-XL) 25 MG 24 hr tablet TAKE 1/2 TABLET(12.5 MG) BY MOUTH DAILY 45 tablet 3  . montelukast (SINGULAIR) 10 MG tablet Take 1 tablet (10 mg total) by mouth at bedtime. 90 tablet 1  . mupirocin ointment (BACTROBAN) 2 % Use twice a day as directed 30 g 1  . nystatin cream (MYCOSTATIN) Apply 1 application topically daily as needed for dry skin.   0  . potassium chloride (K-DUR) 10 MEQ tablet Take 4 tablets (40 mEq total) by mouth daily. 360 tablet 3  . Pyridoxine HCl (VITAMIN B-6 PO) Take 1 tablet by mouth daily.    . rivaroxaban (XARELTO) 20 MG TABS tablet TAKE 1 TABLET(20 MG) BY MOUTH DAILY WITH  SUPPER 90 tablet 1  . rosuvastatin (CRESTOR) 20 MG tablet Take 1 tablet (20 mg total) by mouth daily. 90 tablet 3  . Saccharomyces boulardii (FLORASTOR PO) Take 2 capsules by mouth daily.    Marland Kitchen tiZANidine (ZANAFLEX) 2 MG tablet Take 1 tablet (2 mg total) by mouth at bedtime. 90 tablet 1  . traMADol-acetaminophen (ULTRACET) 37.5-325 MG tablet Take 1-2 tablets by mouth every 6 (six) hours as needed for moderate pain or severe pain.   0  . triamcinolone (NASACORT AQ) 55 MCG/ACT AERO nasal inhaler Place 2 sprays daily into the nose. 1 Inhaler 12  . triamcinolone cream (KENALOG) 0.1 % Apply 1 application topically 2 (two) times daily. For 2 weeks, then as needed 80 g 2  . valACYclovir (VALTREX) 1000 MG tablet TAKE 2 TABLETS BY MOUTH TWICE DAILY FOR 1 DAY-TAKE DOSES 12 HOURS APART    . vitamin C (ASCORBIC ACID) 500 MG tablet Take 500 mg by mouth daily.    Marland Kitchen VITAMIN E PO Take 1 tablet by mouth daily.     No current facility-administered medications on file prior to visit.   Review of Systems All otherwise neg per pt    Objective:   Physical Exam BP 130/80 (BP Location: Left Arm, Patient Position: Sitting, Cuff Size: Large)   Pulse 72   Temp 98.7 F (37.1 C) (Oral)   SpO2 95%  VS noted,  Constitutional: Pt appears in NAD HENT: Head: NCAT.  Right Ear: External ear normal.  Left Ear: External ear normal.  Eyes: . Pupils are equal, round, and reactive to light. Conjunctivae and EOM are normal Nose: without d/c or deformity Neck: Neck supple. Gross normal ROM Cardiovascular: Normal rate and regular rhythm.   Pulmonary/Chest: Effort normal and breath sounds without rales but few bilat wheezing.  Abd:  Soft, NT, ND, + BS, no organomegaly Neurological: Pt is alert. At baseline orientation, motor grossly intact Skin: Skin is warm. No rashes, other new lesions, no LE edema Psychiatric: Pt behavior is normal without agitation  All otherwise neg per pt Lab Results  Component Value Date   WBC 5.9  09/23/2019   HGB 13.8 09/23/2019   HCT 41.3 09/23/2019   PLT 144.0 (L) 09/23/2019   GLUCOSE 143 (H) 09/23/2019   CHOL 191 09/23/2019   TRIG 109.0 09/23/2019   HDL 67.00 09/23/2019   LDLDIRECT 107.0 09/17/2016   LDLCALC 102 (H) 09/23/2019   ALT 16 09/23/2019   AST 18 09/23/2019   NA 139 09/23/2019   K 3.8 09/23/2019   CL 103 09/23/2019   CREATININE 0.87 09/23/2019   BUN 17 09/23/2019   CO2 31 09/23/2019   TSH 1.75 09/23/2019   HGBA1C 6.2 09/23/2019      Assessment & Plan:

## 2019-09-30 NOTE — Assessment & Plan Note (Signed)
stable overall by history and exam, recent data reviewed with pt, and pt to continue medical treatment as before,  to f/u any worsening symptoms or concerns, for f/u lab 

## 2019-09-30 NOTE — Patient Instructions (Signed)
You had the steroid shot today  Please take all new medication as prescribed  Please continue all other medications as before, and refills have been done if requested.

## 2019-09-30 NOTE — Assessment & Plan Note (Signed)
stable overall by history and exam, recent data reviewed with pt, and pt to continue medical treatment as before,  to f/u any worsening symptoms or concerns; stable volume and rate

## 2019-11-09 ENCOUNTER — Other Ambulatory Visit: Payer: Self-pay | Admitting: Internal Medicine

## 2019-11-09 NOTE — Telephone Encounter (Signed)
Please refill as per office routine med refill policy (all routine meds refilled for 3 mo or monthly per pt preference up to one year from last visit, then month to month grace period for 3 mo, then further med refills will have to be denied)  

## 2019-11-12 ENCOUNTER — Encounter: Payer: Self-pay | Admitting: Internal Medicine

## 2019-11-12 DIAGNOSIS — R0600 Dyspnea, unspecified: Secondary | ICD-10-CM

## 2019-11-12 NOTE — Telephone Encounter (Signed)
Staff to assist pt with scheduling please

## 2019-11-15 ENCOUNTER — Ambulatory Visit (INDEPENDENT_AMBULATORY_CARE_PROVIDER_SITE_OTHER): Payer: Medicare Other

## 2019-11-15 ENCOUNTER — Other Ambulatory Visit: Payer: Medicare Other

## 2019-11-15 ENCOUNTER — Other Ambulatory Visit: Payer: Self-pay

## 2019-11-15 DIAGNOSIS — R0600 Dyspnea, unspecified: Secondary | ICD-10-CM

## 2019-11-15 DIAGNOSIS — I517 Cardiomegaly: Secondary | ICD-10-CM | POA: Diagnosis not present

## 2019-11-16 ENCOUNTER — Other Ambulatory Visit: Payer: Self-pay

## 2019-11-16 LAB — CBC WITH DIFFERENTIAL/PLATELET
Absolute Monocytes: 592 cells/uL (ref 200–950)
Basophils Absolute: 20 cells/uL (ref 0–200)
Basophils Relative: 0.3 %
Eosinophils Absolute: 109 cells/uL (ref 15–500)
Eosinophils Relative: 1.6 %
HCT: 41.9 % (ref 35.0–45.0)
Hemoglobin: 13.8 g/dL (ref 11.7–15.5)
Lymphs Abs: 1448 cells/uL (ref 850–3900)
MCH: 32.9 pg (ref 27.0–33.0)
MCHC: 32.9 g/dL (ref 32.0–36.0)
MCV: 99.8 fL (ref 80.0–100.0)
MPV: 10.4 fL (ref 7.5–12.5)
Monocytes Relative: 8.7 %
Neutro Abs: 4631 cells/uL (ref 1500–7800)
Neutrophils Relative %: 68.1 %
Platelets: 168 10*3/uL (ref 140–400)
RBC: 4.2 10*6/uL (ref 3.80–5.10)
RDW: 13.1 % (ref 11.0–15.0)
Total Lymphocyte: 21.3 %
WBC: 6.8 10*3/uL (ref 3.8–10.8)

## 2019-11-16 LAB — BASIC METABOLIC PANEL
BUN: 18 mg/dL (ref 7–25)
CO2: 29 mmol/L (ref 20–32)
Calcium: 9.5 mg/dL (ref 8.6–10.4)
Chloride: 105 mmol/L (ref 98–110)
Creat: 0.87 mg/dL (ref 0.60–0.88)
Glucose, Bld: 143 mg/dL — ABNORMAL HIGH (ref 65–99)
Potassium: 4.2 mmol/L (ref 3.5–5.3)
Sodium: 142 mmol/L (ref 135–146)

## 2019-11-16 LAB — HEPATIC FUNCTION PANEL
AG Ratio: 1.7 (calc) (ref 1.0–2.5)
ALT: 13 U/L (ref 6–29)
AST: 17 U/L (ref 10–35)
Albumin: 3.8 g/dL (ref 3.6–5.1)
Alkaline phosphatase (APISO): 58 U/L (ref 37–153)
Bilirubin, Direct: 0.2 mg/dL (ref 0.0–0.2)
Globulin: 2.3 g/dL (calc) (ref 1.9–3.7)
Indirect Bilirubin: 0.9 mg/dL (calc) (ref 0.2–1.2)
Total Bilirubin: 1.1 mg/dL (ref 0.2–1.2)
Total Protein: 6.1 g/dL (ref 6.1–8.1)

## 2019-11-16 LAB — BRAIN NATRIURETIC PEPTIDE: Brain Natriuretic Peptide: 72 pg/mL (ref <100)

## 2019-11-16 MED ORDER — WIXELA INHUB 500-50 MCG/DOSE IN AEPB: 1.0000 | INHALATION_SPRAY | Freq: Two times a day (BID) | RESPIRATORY_TRACT | 11 refills | Status: DC

## 2019-11-17 ENCOUNTER — Other Ambulatory Visit: Payer: Self-pay | Admitting: Internal Medicine

## 2019-11-17 ENCOUNTER — Encounter: Payer: Self-pay | Admitting: Internal Medicine

## 2019-11-17 DIAGNOSIS — M1711 Unilateral primary osteoarthritis, right knee: Secondary | ICD-10-CM | POA: Diagnosis not present

## 2019-11-17 DIAGNOSIS — M1712 Unilateral primary osteoarthritis, left knee: Secondary | ICD-10-CM | POA: Diagnosis not present

## 2019-11-17 MED ORDER — FUROSEMIDE 40 MG PO TABS: 40.0000 mg | ORAL_TABLET | Freq: Every day | ORAL | 3 refills | Status: DC

## 2019-11-18 ENCOUNTER — Encounter: Payer: Self-pay | Admitting: Internal Medicine

## 2019-11-23 ENCOUNTER — Other Ambulatory Visit: Payer: Self-pay

## 2019-11-23 ENCOUNTER — Encounter: Payer: Self-pay | Admitting: Internal Medicine

## 2019-11-23 ENCOUNTER — Ambulatory Visit (INDEPENDENT_AMBULATORY_CARE_PROVIDER_SITE_OTHER): Payer: Medicare Other | Admitting: Internal Medicine

## 2019-11-23 DIAGNOSIS — I10 Essential (primary) hypertension: Secondary | ICD-10-CM

## 2019-11-23 DIAGNOSIS — I509 Heart failure, unspecified: Secondary | ICD-10-CM | POA: Diagnosis not present

## 2019-11-23 DIAGNOSIS — J45909 Unspecified asthma, uncomplicated: Secondary | ICD-10-CM | POA: Diagnosis not present

## 2019-11-23 MED ORDER — POTASSIUM CHLORIDE CRYS ER 20 MEQ PO TBCR: 20.0000 meq | EXTENDED_RELEASE_TABLET | Freq: Every day | ORAL | 3 refills | Status: DC

## 2019-11-23 NOTE — Assessment & Plan Note (Signed)
stable overall by history and exam, recent data reviewed with pt, and pt to continue medical treatment as before,  to f/u any worsening symptoms or concerns  

## 2019-11-23 NOTE — Progress Notes (Signed)
Subjective:    Patient ID: [name], female    DOB: Dec 19, 1935, 84 y.o.   MRN: [id number]  HPI  Here to f/u after recent change hct to lasix for improved diuresis with recent mild volume increased; Started lasix on Friday with good effect - Swelling improved, and wt down about 10 lbs since last wk., and pt states very impressed that Breathing improved.  .Pt denies chest pain, increased sob or doe, wheezing, orthopnea, PND, increased LE swelling, palpitations, dizziness or syncope. Going to reunion this weekend.   Pt denies new neurological symptoms such as new headache, or facial or extremity weakness or numbness   Pt denies polydipsia, polyuria    Wt Readings from Last 3 Encounters:  04/27/19 295 lb (133.8 kg)  03/30/19 292 lb (132.5 kg)  03/09/19 292 lb (132.5 kg)   Past Medical History:  Diagnosis Date  . Allergy   . Anxiety   . Arthritis    no cartlidge in knees  . Asthma   . Bradycardia    a. atenolol stopped due to this, HR 40s.  . 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 CAD in native artery    a.  CAD s/p RCA stent 2000 with residual mild-mod disease of left system 2001.  Marland Kitchen Chronic atrial fibrillation ([facility])   . COPD (chronic obstructive pulmonary disease) ([facility])   . Essential hypertension 03/24/2015  . Gout    post operative  . Hx of radiation therapy 04/02/11 -05/20/11   left breast  . Hyperlipidemia   . Hypothyroidism 03/24/2015  . Incontinence of urine   . 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 Osteoporosis 03/24/2015  . Peripheral neuropathy 03/24/2015  . Plantar fasciitis   . Sleep apnea    Past Surgical History:  Procedure Laterality Date  . BREAST LUMPECTOMY W/ NEEDLE LOCALIZATION  01/29/2011   Left with [facility] Dr Jamey Ripa  . CHOLECYSTECTOMY  1955  . CORONARY ANGIOPLASTY WITH STENT PLACEMENT  2000  . DILATION AND CURETTAGE OF UTERUS  1976   and 1996  . HAND SURGERY  1992   tendons between thumb and  forefinger  . SKIN BIOPSY     1994, 2006, 2008, 2010, 2011, 2011, 2012-  pre-cancerous  . TONSILLECTOMY  1955    reports that she quit smoking about 57 years ago. She has a 4.00 pack-year smoking history. She has never used smokeless tobacco. She reports that she does not drink alcohol and does not use drugs. family history includes Asthma in her sister; Cancer in her father; Heart attack in her brother and father; Heart disease in her brother and father. Allergies  Allergen Reactions  . Crestor [Rosuvastatin Calcium]     Severe liver function problems  . Hydrocodone-Acetaminophen Anxiety and Itching  . Lidocaine Hcl Other (See Comments)    Novacaine:  Becomes very shaky  . Lipitor [Atorvastatin Calcium] Other (See Comments)    Elevated liver function   . Procaine Other (See Comments)    shaking  . Rosuvastatin Other (See Comments)    Severe liver function problems  . Theophylline Other (See Comments)    Becomes very shaky skaking skaking  . Vicodin [Hydrocodone-Acetaminophen] Itching    Itching all over  . Codeine Nausea Only, Anxiety and Other (See Comments)    Also complained of dizziness.  Has same problems with oxycodone and hydrocodone Visual Disturbance, Balance Difficulty, Dizzy "Room Spinning; "Swimming" Balance, vision, nausea, dizzy, "  swimming", "room spinning" Balance, vision, nausea, dizzy, "swimming", "room spinning"  . Oxycodone-Acetaminophen Nausea Only, Anxiety and Other (See Comments)    Visual Disturbance, Balance Difficulty, Dizzy "Room Spinning; "Swimming"  . Propoxyphene Anxiety, Nausea Only and Other (See Comments)    Visual Disturbance, Balance Difficulty, Dizzy "Room Spinning; "Swimming" Balance, vision, nausea, dizzy, "swimming", "room spinning" Balance, vision, nausea, dizzy, "swimming, "room spinning"  . Quinine Derivatives Nausea Only    dizzy  . Sulfa Antibiotics Nausea Only    Also complained of dizziness  . Atorvastatin Other (See Comments)     Severe liver function problems  . Ephedrine Other (See Comments)    shaking  . Other Other (See Comments)    Quinine causes nausea, dizzy  . Oxycodone Other (See Comments)    Balance, vision, nausea, dizzy, "swimming", "room spinning"  . Cephalexin Rash  . Epinephrine Other (See Comments)    Patient becomes "shaky" shaking shaking  . Penicillins Rash    Pt reported all over body rash in 1958 Has patient had a PCN reaction causing immediate rash, facial/tongue/throat swelling, SOB or lightheadedness with hypotension:Yes Has patient had a PCN reaction causing severe rash involving mucus membranes or skin necrosis: No Has patient had a PCN reaction that required hospitalization: No Has patient had a PCN reaction occurring within the last 10 years:No    . Theophyllines Other (See Comments)    Patient becomes "shaky"   Current Outpatient Medications on File Prior to Visit  Medication Sig Dispense Refill  . albuterol (PROVENTIL) (2.5 MG/3ML) 0.083% nebulizer solution Take 3 mLs (2.5 mg total) by nebulization every 6 (six) hours as needed for wheezing or shortness of breath. 75 mL 12  . albuterol (VENTOLIN HFA) 108 (90 Base) MCG/ACT inhaler INHALE 2 PUFFS BY MOUTH INTO THE LUNGS IF NEEDED FOR WHEEZING AND OR SHORTNESS OF BREATH 8.5 g 2  . allopurinol (ZYLOPRIM) 300 MG tablet Take 300 mg by mouth daily.  1  . ALPRAZolam (XANAX) 0.5 MG tablet TAKE 1 TO 2 TABLETS BY MOUTH TWICE DAILY AS NEEDED FOR ANXIETY OR SLEEP 60 tablet 2  . azelastine (ASTELIN) 0.1 % nasal spray Place 2 sprays into both nostrils daily. Use in each nostril as directed 30 mL 5  . azelastine (OPTIVAR) 0.05 % ophthalmic solution Place 1 drop into both eyes 2 (two) times daily. 6 mL 12  . B Complex Vitamins (VITAMIN B COMPLEX PO) Take 1 tablet by mouth daily.    . Betamethasone Sodium Phosphate 6 MG/ML SOLN INJECT 2:4 SYRINGE BILATERALLY INTO EACH KNEE JOINT    . Biotin 5000 MCG CAPS Take 5,000 mcg by mouth daily.     .  cholecalciferol (VITAMIN D) 1000 UNITS tablet Take 1,000 Units by mouth daily.    Marland Kitchen co-enzyme Q-10 50 MG capsule Take 400 mg by mouth daily.    . colchicine (COLCRYS) 0.6 MG tablet Take 0.6 mg by mouth daily as needed (flare  up).     . Cyanocobalamin (VITAMIN B-12 PO) Take 1 tablet by mouth daily.    . cycloSPORINE (RESTASIS) 0.05 % ophthalmic emulsion Apply to eye as needed.    . diclofenac sodium (VOLTAREN) 1 % GEL Apply 4 g topically 4 (four) times daily as needed. 200 g 11  . EQL Natural Zinc 50 MG TABS Take 50 mg by mouth daily.    Marland Kitchen escitalopram (LEXAPRO) 10 MG tablet TAKE 1 TABLET(10 MG) BY MOUTH DAILY 90 tablet 3  . Evolocumab with Infusor ([place]) 420  MG/3.5ML SOCT Inject 1 Units into the skin every 30 (thirty) days. 3.5 mL 11  . fexofenadine (ALLEGRA) 180 MG tablet TAKE 1 TABLET BY MOUTH EVERY DAY 90 tablet 1  . furosemide (LASIX) 40 MG tablet Take 1 tablet (40 mg total) by mouth daily. 90 tablet 3  . gabapentin (NEURONTIN) 300 MG capsule Take 1 capsule (300 mg total) by mouth 3 (three) times daily. (Patient taking differently: Take 300 mg by mouth daily. ) 270 capsule 1  . HYDROcodone-acetaminophen (NORCO/VICODIN) 5-325 MG tablet Take 1 tablet by mouth every 4 (four) hours as needed.    . levalbuterol (XOPENEX HFA) 45 MCG/ACT inhaler Inhale 2 puffs into the lungs every 6 (six) hours as needed for wheezing or shortness of breath. 3 Inhaler 3  . levocetirizine (XYZAL) 5 MG tablet Take 1 tablet (5 mg total) by mouth every evening. 30 tablet 11  . levothyroxine (SYNTHROID) 75 MCG tablet TAKE 1 TABLET(75 MCG) BY MOUTH DAILY 90 tablet 1  . metoprolol succinate (TOPROL-XL) 25 MG 24 hr tablet TAKE 1/2 TABLET(12.5 MG) BY MOUTH DAILY 45 tablet 3  . montelukast (SINGULAIR) 10 MG tablet Take 1 tablet (10 mg total) by mouth at bedtime. 90 tablet 1  . mupirocin ointment (BACTROBAN) 2 % Use twice a day as directed 30 g 1  . nystatin cream (MYCOSTATIN) Apply 1 application topically  daily as needed for dry skin.   0  . predniSONE (DELTASONE) 10 MG tablet 2 tabs by mouth per day for 10 days 20 tablet 0  . Pyridoxine HCl (VITAMIN B-6 PO) Take 1 tablet by mouth daily.    . rivaroxaban (XARELTO) 20 MG TABS tablet TAKE 1 TABLET(20 MG) BY MOUTH DAILY WITH SUPPER 90 tablet 1  . rosuvastatin (CRESTOR) 20 MG tablet Take 1 tablet (20 mg total) by mouth daily. 90 tablet 3  . Saccharomyces boulardii (FLORASTOR PO) Take 2 capsules by mouth daily.    Marland Kitchen tiZANidine (ZANAFLEX) 2 MG tablet Take 1 tablet (2 mg total) by mouth at bedtime. 90 tablet 1  . traMADol-acetaminophen (ULTRACET) 37.5-325 MG tablet Take 1-2 tablets by mouth every 6 (six) hours as needed for moderate pain or severe pain.   0  . triamcinolone (NASACORT AQ) 55 MCG/ACT AERO nasal inhaler Place 2 sprays daily into the nose. 1 Inhaler 12  . triamcinolone cream (KENALOG) 0.1 % Apply 1 application topically 2 (two) times daily. For 2 weeks, then as needed 80 g 2  . valACYclovir (VALTREX) 1000 MG tablet TAKE 2 TABLETS BY MOUTH TWICE DAILY FOR 1 DAY-TAKE DOSES 12 HOURS APART    . vitamin C (ASCORBIC ACID) 500 MG tablet Take 500 mg by mouth daily.    Marland Kitchen VITAMIN E PO Take 1 tablet by mouth daily.    Monte Fantasia INHUB 500-50 MCG/DOSE AEPB Inhale 1 puff into the lungs 2 (two) times daily. 60 each 11   No current facility-administered medications on file prior to visit.   Review of Systems All otherwise neg per pt     Objective:   Physical Exam BP 134/78 (BP Location: Left Arm, Patient Position: Sitting, Cuff Size: Large)   Pulse 62   Temp 98.2 F (36.8 C) (Oral)   Ht 5\' 6"  (1.676 m)   SpO2 96%   BMI 47.61 kg/m  VS noted,  Constitutional: Pt appears in NAD HENT: Head: NCAT.  Right Ear: External ear normal.  Left Ear: External ear normal.  Eyes: . Pupils are equal, round, and reactive to light.  Conjunctivae and EOM are normal Nose: without d/c or deformity Neck: Neck supple. Gross normal ROM Cardiovascular: Normal rate  and regular rhythm.   Pulmonary/Chest: Effort normal and breath sounds without rales or wheezing.  Abd:  Soft, NT, ND, + BS, no organomegaly Neurological: Pt is alert. At baseline orientation, motor grossly intact Skin: Skin is warm. No rashes, other new lesions, no LE edema Psychiatric: Pt behavior is normal without agitation  All otherwise neg per pt Lab Results  Component Value Date   WBC 6.8 11/15/2019   HGB 13.8 11/15/2019   HCT 41.9 11/15/2019   PLT 168 11/15/2019   GLUCOSE 143 (H) 11/15/2019   CHOL 191 09/23/2019   TRIG 109.0 09/23/2019   HDL 67.00 09/23/2019   LDLDIRECT 107.0 09/17/2016   LDLCALC 102 (H) 09/23/2019   ALT 13 11/15/2019   AST 17 11/15/2019   NA 142 11/15/2019   K 4.2 11/15/2019   CL 105 11/15/2019   CREATININE 0.87 11/15/2019   BUN 18 11/15/2019   CO2 29 11/15/2019   TSH 1.75 09/23/2019   HGBA1C 6.2 09/23/2019      Assessment & Plan:

## 2019-11-23 NOTE — Patient Instructions (Signed)
Ok to continue the lasix at 40 mg per day  Please take all new medication as prescribed - the potassium with it  Please continue all other medications as before, and refills have been done if requested.  Please have the pharmacy call with any other refills you may need.  Please keep your appointments with your specialists as you may have planned, and Please make an appt with Dr Lacie Draft for next available  You will be contacted regarding the referral for: echocardiogram  Please go to the LAB at the blood drawing area for the tests to be done - tomorrow or soon at this building [facility])  [place] will be contacted by phone if any changes need to be made immediately.  Otherwise, you will receive a letter about your results with an explanation, but please check with MyChart first.  Please remember to sign up for MyChart if you have not done so, as this will be important to you in the future with finding out test results, communicating by private email, and scheduling acute appointments online when needed.

## 2019-11-23 NOTE — Assessment & Plan Note (Addendum)
Type unknown, cont lasix, add potassium back, check bmp, for echo, pt states will f/u with cardiology as well  I spent 31 minutes in preparing to see the patient by review of recent labs, imaging and procedures, obtaining and reviewing separately obtained history, communicating with the patient and family or caregiver, ordering medications, tests or procedures, and documenting clinical information in the EHR including the differential Dx, treatment, and any further evaluation and other management of chf, htn, asthma

## 2019-11-24 ENCOUNTER — Other Ambulatory Visit: Payer: Medicare Other

## 2019-11-24 DIAGNOSIS — I509 Heart failure, unspecified: Secondary | ICD-10-CM | POA: Diagnosis not present

## 2019-11-24 LAB — BASIC METABOLIC PANEL WITH GFR
BUN: 15 mg/dL (ref 7–25)
CO2: 32 mmol/L (ref 20–32)
Calcium: 9.3 mg/dL (ref 8.6–10.4)
Chloride: 103 mmol/L (ref 98–110)
Creat: 0.83 mg/dL (ref 0.60–0.88)
GFR, Est African American: 76 mL/min/{1.73_m2} (ref >=60)
GFR, Est Non African American: 66 mL/min/{1.73_m2} (ref >=60)
Glucose, Bld: 120 mg/dL — ABNORMAL HIGH (ref 65–99)
Potassium: 3.4 mmol/L — ABNORMAL LOW (ref 3.5–5.3)
Sodium: 144 mmol/L (ref 135–146)

## 2019-11-25 ENCOUNTER — Encounter: Payer: Self-pay | Admitting: Internal Medicine

## 2019-11-27 ENCOUNTER — Ambulatory Visit (HOSPITAL_COMMUNITY)
Admission: RE | Admit: 2019-11-27 | Discharge: 2019-11-27 | Disposition: A | Discharge status: Home / Self Care | Payer: Medicare Other | Source: Ambulatory Visit | Attending: Family Medicine | Admitting: Family Medicine

## 2019-11-27 ENCOUNTER — Other Ambulatory Visit: Payer: Self-pay

## 2019-11-27 ENCOUNTER — Encounter (HOSPITAL_COMMUNITY): Payer: Self-pay

## 2019-11-27 VITALS — BP 126/82 | HR 61 | Temp 98.3°F | Resp 16

## 2019-11-27 DIAGNOSIS — J449 Chronic obstructive pulmonary disease, unspecified: Secondary | ICD-10-CM | POA: Diagnosis not present

## 2019-11-27 DIAGNOSIS — I2510 Atherosclerotic heart disease of native coronary artery without angina pectoris: Secondary | ICD-10-CM | POA: Diagnosis not present

## 2019-11-27 DIAGNOSIS — Z955 Presence of coronary angioplasty implant and graft: Secondary | ICD-10-CM | POA: Diagnosis not present

## 2019-11-27 DIAGNOSIS — I482 Chronic atrial fibrillation, unspecified: Secondary | ICD-10-CM | POA: Diagnosis not present

## 2019-11-27 DIAGNOSIS — M1990 Unspecified osteoarthritis, unspecified site: Secondary | ICD-10-CM | POA: Diagnosis not present

## 2019-11-27 DIAGNOSIS — E039 Hypothyroidism, unspecified: Secondary | ICD-10-CM | POA: Insufficient documentation

## 2019-11-27 DIAGNOSIS — Z20822 Contact with and (suspected) exposure to covid-19: Secondary | ICD-10-CM | POA: Insufficient documentation

## 2019-11-27 DIAGNOSIS — Z87891 Personal history of nicotine dependence: Secondary | ICD-10-CM | POA: Insufficient documentation

## 2019-11-27 DIAGNOSIS — M109 Gout, unspecified: Secondary | ICD-10-CM | POA: Insufficient documentation

## 2019-11-27 DIAGNOSIS — J040 Acute laryngitis: Secondary | ICD-10-CM

## 2019-11-27 DIAGNOSIS — F419 Anxiety disorder, unspecified: Secondary | ICD-10-CM | POA: Insufficient documentation

## 2019-11-27 DIAGNOSIS — R590 Localized enlarged lymph nodes: Secondary | ICD-10-CM | POA: Diagnosis not present

## 2019-11-27 DIAGNOSIS — J039 Acute tonsillitis, unspecified: Secondary | ICD-10-CM | POA: Insufficient documentation

## 2019-11-27 DIAGNOSIS — I110 Hypertensive heart disease with heart failure: Secondary | ICD-10-CM | POA: Diagnosis not present

## 2019-11-27 DIAGNOSIS — Z79899 Other long term (current) drug therapy: Secondary | ICD-10-CM | POA: Diagnosis not present

## 2019-11-27 DIAGNOSIS — Z7901 Long term (current) use of anticoagulants: Secondary | ICD-10-CM | POA: Diagnosis not present

## 2019-11-27 DIAGNOSIS — I509 Heart failure, unspecified: Secondary | ICD-10-CM | POA: Diagnosis not present

## 2019-11-27 DIAGNOSIS — E785 Hyperlipidemia, unspecified: Secondary | ICD-10-CM | POA: Diagnosis not present

## 2019-11-27 DIAGNOSIS — G629 Polyneuropathy, unspecified: Secondary | ICD-10-CM | POA: Diagnosis not present

## 2019-11-27 LAB — SARS CORONAVIRUS 2 (TAT 6-24 HRS): SARS Coronavirus 2: NEGATIVE

## 2019-11-27 MED ORDER — SULFAMETHOXAZOLE-TRIMETHOPRIM 800-160 MG PO TABS
1.0000 | ORAL_TABLET | Freq: Two times a day (BID) | ORAL | 0 refills | Status: AC
Start: 2019-11-27 — End: 2019-12-04

## 2019-11-27 MED ORDER — SULFAMETHOXAZOLE-TRIMETHOPRIM 800-160 MG PO TABS
1.0000 | ORAL_TABLET | Freq: Two times a day (BID) | ORAL | 0 refills | Status: DC
Start: 2019-11-27 — End: 2019-11-27

## 2019-11-27 NOTE — ED Triage Notes (Signed)
Pt presents to UC for "swollen bump in right neck". Pt concerned for swollen lymphnodes. Pt denies painful swallowing, fever, chills, n/v, runny nose, cough, congestion, loss of taste or smell. Pt states "I'm getting raspy in my throat". Pt denies OTC treatments or relieving factors. Pt requesting covid testing today.

## 2019-11-27 NOTE — Discharge Instructions (Addendum)
I have sent in Bactrim for you to take twice a day for 7 days   If you are not feeling better near the end of the antibiotic course, follow up with this office or with primary care  Follow up with the ER for trouble swallowing, trouble breathing, other concerning symptoms

## 2019-12-01 NOTE — ED Provider Notes (Signed)
Roderic Palau    CSN: [id number] Arrival date & time: 11/27/19  1646      History   Chief Complaint Chief Complaint  Patient presents with  . Sore Throat    HPI [name] is a 84 y.o. female.   Reports that she has had a lump to her R neck over the last 2-3 days as well as sore throat and sinus drainage. Reports that the area is tender to touch, and that she thinks that it may be affecting her voice. Has had enlarged lymph nodes in the past, but they were not tender. Has had CT in 2016 for this and reports that it was negative. Denies headache, cough, nausea, vomiting, diarrhea, rash, fever, other symptoms. No hx Covid. Has completed Covid vaccines. Has extensive medical history including COPD, breast cancer.  ROS per HPI  The history is provided by the patient.  Sore Throat    Past Medical History:  Diagnosis Date  . Allergy   . Anxiety   . Arthritis    no cartlidge in knees  . Asthma   . Bradycardia    a. atenolol stopped due to this, HR 40s.  . 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 CAD in native artery    a.  CAD s/p RCA stent 2000 with residual mild-mod disease of left system 2001.  Marland Kitchen Chronic atrial fibrillation ([place])   . COPD (chronic obstructive pulmonary disease) ([place])   . Essential hypertension 03/24/2015  . Gout    post operative  . Hx of radiation therapy 04/02/11 -05/20/11   left breast  . Hyperlipidemia   . Hypothyroidism 03/24/2015  . Incontinence of urine   . Malignant neoplasm of upper-outer quadrant of female breast ([place]) 12/26/2010  . Osteoporosis 03/24/2015  . Peripheral neuropathy 03/24/2015  . Plantar fasciitis   . Sleep apnea     Patient Active Problem List   Diagnosis Date Noted  . CHF (congestive heart failure) ([place]) 11/23/2019  . Asthma exacerbation 12/04/2018  . Exposure to COVID-19 virus 12/04/2018  . Osteoarthritis 09/19/2018  . Abscess of left leg 09/26/2017  . Left leg cellulitis 09/18/2017  .  Whiplash 06/10/2017  . Cough 05/29/2017  . Concussion 05/29/2017  . Hammer toes of both feet 12/19/2016  . Allergic rhinitis 12/09/2016  . Pedal edema 09/17/2016  . Chest pain 07/18/2016  . Gross hematuria 04/26/2016  . Hyperglycemia 03/21/2016  . Dysuria 12/07/2015  . Fever blister 12/07/2015  . Anxiety and depression 06/30/2015  . Preventative health care 06/30/2015  . Neck mass 03/24/2015  . Hypothyroidism 03/24/2015  . Gout 03/24/2015  . Essential hypertension 03/24/2015  . Asthma 03/24/2015  . Peripheral neuropathy 03/24/2015  . Osteoporosis 03/24/2015  . OSA (obstructive sleep apnea) 03/24/2015  . Morbid obesity ([place]) 03/24/2015  . Long term current use of anticoagulant therapy 03/16/2015  . Lymphadenopathy, cervical 03/16/2015  . Diarrhea 03/16/2015  . Hyperbilirubinemia 03/16/2015  . Coronary arteriosclerosis 09/22/2014  . Hyperlipidemia 07/23/2013  . Hypokalemia 02/18/2013  . Elevated bilirubin 02/18/2013  . Plantar fasciitis   . Arthritis   . Allergy   . Hx of radiation therapy   . Atrial fibrillation ([place]) 01/21/2011  . Malignant neoplasm of upper-outer quadrant of left breast in female, estrogen receptor positive ([place]) 12/26/2010  . 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 BREAST LUMPECTOMY W/ NEEDLE LOCALIZATION  01/29/2011  Left with [facility] Dr Jamey Ripa  . CHOLECYSTECTOMY  1955  . CORONARY ANGIOPLASTY WITH STENT PLACEMENT  2000  . DILATION AND CURETTAGE OF UTERUS  1976   and 1996  . HAND SURGERY  1992   tendons between thumb and forefinger  . SKIN BIOPSY     1994, 2006, 2008, 2010, 2011, 2011, 2012-  pre-cancerous  . TONSILLECTOMY  1955    OB History   No obstetric history on file.      Home Medications    Prior to Admission medications   Medication Sig Start Date End Date Taking? Authorizing Provider  albuterol (PROVENTIL) (2.5 MG/3ML) 0.083% nebulizer solution Take 3 mLs (2.5 mg  total) by nebulization every 6 (six) hours as needed for wheezing or shortness of breath. 09/17/16   [name], MD  albuterol (VENTOLIN HFA) 108 (90 Base) MCG/ACT inhaler INHALE 2 PUFFS BY MOUTH INTO THE LUNGS IF NEEDED FOR WHEEZING AND OR SHORTNESS OF BREATH 07/02/19   [name], MD  allopurinol (ZYLOPRIM) 300 MG tablet Take 300 mg by mouth daily. 08/13/17   [provider]  ALPRAZolam Prudy Feeler) 0.5 MG tablet TAKE 1 TO 2 TABLETS BY MOUTH TWICE DAILY AS NEEDED FOR ANXIETY OR SLEEP 09/20/19   [name], MD  azelastine (ASTELIN) 0.1 % nasal spray Place 2 sprays into both nostrils daily. Use in each nostril as directed 07/14/19   [name], MD  azelastine (OPTIVAR) 0.05 % ophthalmic solution Place 1 drop into both eyes 2 (two) times daily. 09/17/16   [name], MD  B Complex Vitamins (VITAMIN B COMPLEX PO) Take 1 tablet by mouth daily.    [provider]  Betamethasone Sodium Phosphate 6 MG/ML SOLN INJECT 2:4 SYRINGE BILATERALLY INTO [facility] KNEE JOINT 08/02/19   [provider]  Biotin 5000 MCG CAPS Take 5,000 mcg by mouth daily.     [provider]  cholecalciferol (VITAMIN D) 1000 UNITS tablet Take 1,000 Units by mouth daily.    [provider]  co-enzyme Q-10 50 MG capsule Take 400 mg by mouth daily.    [provider]  colchicine (COLCRYS) 0.6 MG tablet Take 0.6 mg by mouth daily as needed (flare  up).     [provider]  Cyanocobalamin (VITAMIN B-12 PO) Take 1 tablet by mouth daily.    [provider]  cycloSPORINE (RESTASIS) 0.05 % ophthalmic emulsion Apply to eye as needed.    [provider]  diclofenac sodium (VOLTAREN) 1 % GEL Apply 4 g topically 4 (four) times daily as needed. 09/17/18   [name], MD  EQL Natural Zinc 50 MG TABS Take 50 mg by mouth daily. 07/08/12   [provider]  escitalopram (LEXAPRO) 10 MG tablet TAKE 1 TABLET(10 MG) BY MOUTH DAILY 07/14/19   [name], MD    Evolocumab with Infusor (REPATHA PUSHTRONEX SYSTEM) 420 MG/3.5ML SOCT Inject 1 Units into the skin every 30 (thirty) days. 04/12/19   Corky Crafts, MD  fexofenadine [facility]) 180 MG tablet TAKE 1 TABLET BY MOUTH EVERY DAY 09/16/19   [name], MD  furosemide (LASIX) 40 MG tablet Take 1 tablet (40 mg total) by mouth daily. 11/17/19   [name], MD  gabapentin (NEURONTIN) 300 MG capsule Take 1 capsule (300 mg total) by mouth 3 (three) times daily. Patient taking differently: Take 300 mg by mouth daily.  12/17/18   [name], MD  HYDROcodone-acetaminophen (NORCO/VICODIN) 5-325 MG tablet  Take 1 tablet by mouth every 4 (four) hours as needed. 06/10/19   [provider]  levalbuterol Penne Lash HFA) 45 MCG/ACT inhaler Inhale 2 puffs into the lungs every 6 (six) hours as needed for wheezing or shortness of breath. 09/30/19   Biagio Borg, MD  levocetirizine (XYZAL) 5 MG tablet Take 1 tablet (5 mg total) by mouth every evening. 06/24/19   Biagio Borg, MD  levothyroxine (SYNTHROID) 75 MCG tablet TAKE 1 TABLET(75 MCG) BY MOUTH DAILY 11/10/19   Biagio Borg, MD  metoprolol succinate (TOPROL-XL) 25 MG 24 hr tablet TAKE 1/2 TABLET(12.5 MG) BY MOUTH DAILY 05/25/19   Jettie Booze, MD  montelukast (SINGULAIR) 10 MG tablet Take 1 tablet (10 mg total) by mouth at bedtime. 07/19/19   Biagio Borg, MD  mupirocin ointment Drue Stager) 2 % Use twice a day as directed 02/27/18   Marrian Salvage, FNP  nystatin cream (MYCOSTATIN) Apply 1 application topically daily as needed for dry skin.  03/09/14   [provider]  potassium chloride SA (KLOR-CON) 20 MEQ tablet Take 1 tablet (20 mEq total) by mouth daily. 11/23/19   Biagio Borg, MD  predniSONE (DELTASONE) 10 MG tablet 2 tabs by mouth per day for 10 days 09/30/19   Biagio Borg, MD  Pyridoxine HCl (VITAMIN B-6 PO) Take 1 tablet by mouth daily.    [provider]  rivaroxaban (XARELTO) 20 MG TABS tablet TAKE 1 TABLET(20  MG) BY MOUTH DAILY WITH SUPPER 06/07/19   Jettie Booze, MD  rosuvastatin (CRESTOR) 20 MG tablet Take 1 tablet (20 mg total) by mouth daily. 09/24/19   Biagio Borg, MD  Saccharomyces boulardii (FLORASTOR PO) Take 2 capsules by mouth daily.    [provider]  sulfamethoxazole-trimethoprim (BACTRIM DS) 800-160 MG tablet Take 1 tablet by mouth 2 (two) times daily for 7 days. 11/27/19 12/04/19  Faustino Congress, NP  tiZANidine (ZANAFLEX) 2 MG tablet Take 1 tablet (2 mg total) by mouth at bedtime. 07/05/19   Biagio Borg, MD  traMADol-acetaminophen (ULTRACET) 37.5-325 MG tablet Take 1-2 tablets by mouth every 6 (six) hours as needed for moderate pain or severe pain.  02/23/15   [provider]  triamcinolone (NASACORT AQ) 55 MCG/ACT AERO nasal inhaler Place 2 sprays daily into the nose. 03/20/17   Biagio Borg, MD  triamcinolone cream (KENALOG) 0.1 % Apply 1 application topically 2 (two) times daily. For 2 weeks, then as needed 12/18/17   Biagio Borg, MD  valACYclovir (VALTREX) 1000 MG tablet TAKE 2 TABLETS BY MOUTH TWICE DAILY FOR 1 DAY-TAKE DOSES 12 HOURS APART 05/31/19   [provider]  vitamin C (ASCORBIC ACID) 500 MG tablet Take 500 mg by mouth daily.    [provider]  VITAMIN E PO Take 1 tablet by mouth daily.    [provider]  Grant Ruts INHUB 500-50 MCG/DOSE AEPB Inhale 1 puff into the lungs 2 (two) times daily. 11/16/19   Biagio Borg, MD    Family History Family History  Problem Relation Age of Onset  . Heart disease Father   . Cancer Father        intestinal polyps  . Heart attack Father   . Asthma Sister   . Heart disease Brother   . Heart attack Brother     Social History Social History   Tobacco Use  . Smoking status: Former Smoker    Packs/day: 1.00    Years: 4.00  Pack years: 4.00    Quit date: 12/05/1962    Years since quitting: 57.0  . Smokeless tobacco: Never Used  Substance Use Topics  . Alcohol use: No  .  Drug use: No     Allergies   Crestor [rosuvastatin calcium], Hydrocodone-acetaminophen, Lidocaine hcl, Lipitor [atorvastatin calcium], Procaine, Rosuvastatin, Theophylline, Vicodin [hydrocodone-acetaminophen], Codeine, Oxycodone-acetaminophen, Propoxyphene, Quinine derivatives, Sulfa antibiotics, Atorvastatin, Ephedrine, Other, Oxycodone, Cephalexin, Epinephrine, Penicillins, and Theophyllines   Review of Systems Review of Systems   Physical Exam Triage Vital Signs ED Triage Vitals  Enc Vitals Group     BP 11/27/19 1706 126/82     Pulse Rate 11/27/19 1706 61     Resp 11/27/19 1706 16     Temp 11/27/19 1706 98.3 F (36.8 C)     Temp Source 11/27/19 1706 Oral     SpO2 11/27/19 1706 95 %     Weight --      Height --      Head Circumference --      Peak Flow --      Pain Score 11/27/19 1704 3     Pain Loc --      Pain Edu? --      Excl. in [place]? --    No data found.  Updated Vital Signs BP 126/82 (BP Location: Right Arm)   Pulse 61   Temp 98.3 F (36.8 C) (Oral)   Resp 16   SpO2 95%   Visual Acuity Right Eye Distance:   Left Eye Distance:   Bilateral Distance:    Right Eye Near:   Left Eye Near:    Bilateral Near:     Physical Exam Vitals and nursing note reviewed.  Constitutional:      General: She is not in acute distress.    Appearance: She is well-developed. She is obese. She is ill-appearing.  HENT:     Head: Normocephalic and atraumatic.     Right Ear: Tympanic membrane normal.     Left Ear: Tympanic membrane normal.     Nose: Congestion present.     Mouth/Throat:     Mouth: Mucous membranes are moist.     Pharynx: Oropharynx is clear.     Tonsils: No tonsillar exudate. 1+ on the right. 1+ on the left.  Eyes:     Conjunctiva/sclera: Conjunctivae normal.  Cardiovascular:     Rate and Rhythm: Normal rate and regular rhythm.     Heart sounds: Normal heart sounds. No murmur heard.   Pulmonary:     Effort: Pulmonary effort is normal. No respiratory  distress.     Breath sounds: No stridor. Wheezing present. No rhonchi or rales.  Chest:     Chest wall: No tenderness.  Abdominal:     General: Bowel sounds are normal.     Palpations: Abdomen is soft.     Tenderness: There is no abdominal tenderness.  Musculoskeletal:     Cervical back: Neck supple.  Lymphadenopathy:     Cervical: Cervical adenopathy present.  Skin:    General: Skin is warm and dry.     Capillary Refill: Capillary refill takes less than 2 seconds.  Neurological:     General: No focal deficit present.     Mental Status: She is alert and oriented to person, place, and time.  Psychiatric:        Mood and Affect: Mood normal.        Behavior: Behavior normal.      UC Treatments / Results  Labs (all labs ordered are listed, but only abnormal results are displayed) Labs Reviewed  SARS CORONAVIRUS 2 (TAT 6-24 HRS)    EKG   Radiology No results found.  Procedures Procedures (including critical care time)  Medications Ordered in UC Medications - No data to display  Initial Impression / Assessment and Plan / UC Course  I have reviewed the triage vital signs and the nursing notes.  Pertinent labs & imaging results that were available during my care of the patient were reviewed by me and considered in my medical decision making (see chart for details).     Lymphadenopathy Tonsillitis  Prescribed Bactrim Take as prescribed and to completion Follow up with this office or with primary care if you are not feeling better over the course of the antibiotics Follow up with the ER for trouble swallowing, trouble breathing, other concerning symptoms  Covid swab obtained in office today.  Patient instructed to quarantine until results are back and negative.  If results are negative, patient may resume daily schedule as tolerated once they are fever free for 24 hours without the use of antipyretic medications.  If results are positive, patient instructed to  quarantine 10 days from today.  Patient instructed to follow-up with primary care with this office as needed.  Patient instructed to follow-up in the ER for trouble swallowing, trouble breathing, other concerning symptoms.   Final Clinical Impressions(s) / UC Diagnoses   Final diagnoses:  Lymphadenopathy of right cervical region  Acute tonsillitis, unspecified etiology     Discharge Instructions     I have sent in Bactrim for you to take twice a day for 7 days   If you are not feeling better near the end of the antibiotic course, follow up with this office or with primary care  Follow up with the ER for trouble swallowing, trouble breathing, other concerning symptoms    ED Prescriptions    Medication Sig Dispense Auth. Provider   sulfamethoxazole-trimethoprim (BACTRIM DS) 800-160 MG tablet  (Status: Discontinued) Take 1 tablet by mouth 2 (two) times daily for 7 days. 14 tablet Faustino Congress, NP   sulfamethoxazole-trimethoprim (BACTRIM DS) 800-160 MG tablet Take 1 tablet by mouth 2 (two) times daily for 7 days. 14 tablet Faustino Congress, NP     PDMP not reviewed this encounter.   Faustino Congress, NP 12/01/19 1252

## 2019-12-07 ENCOUNTER — Telehealth: Payer: Self-pay | Admitting: Interventional Cardiology

## 2019-12-07 NOTE — Telephone Encounter (Signed)
Patient stated she has had chest tightness since the first of March, and her PCP, Dr. Jenny Reichmann,  put her on several rounds of prednisone. Patient stated she had a chest xray that showed she had fluid around her heart, so Dr. Jenny Reichmann stopped her HCTZ and started her on Lasix 40 mg daily. Patient stated she has improved some and usually she feels better after allergy season, but this year it is on going. Dr. Jenny Reichmann has ordered an echo which is scheduled for this week. Patient has follow up with Dr. Irish Lack on 12/28/19. Patient stated her issues are not urgent, but she wants to know if she needs to be seen sooner. Since Dr. Irish Lack is out of the office this week. Will forward to a doctor on his care team.

## 2019-12-07 NOTE — Telephone Encounter (Signed)
Pt c/o of Chest Pain: STAT if CP now or developed within 24 hours  1. Are you having CP right now? Tightness in chest  2. Are you experiencing any other symptoms (ex. SOB, nausea, vomiting, sweating)?   3. How long have you been experiencing CP? A few months  4. Is your CP continuous or coming and going? Coming and going  5. Have you taken Nitroglycerin? No  ?

## 2019-12-08 NOTE — Telephone Encounter (Signed)
If she has no unstable symptoms, should be ok to get the echo and keep the appt with Dr. Clayton Bibles in 3 weeks. Gerald Stabs

## 2019-12-08 NOTE — Telephone Encounter (Signed)
Called patient back with Dr. Camillia Herter recommendation. Patient verbalized understanding and stated she does not have any unstable symptoms. Will forward to Dr. Irish Lack and his nurse so they are aware.

## 2019-12-10 ENCOUNTER — Other Ambulatory Visit: Payer: Self-pay

## 2019-12-10 ENCOUNTER — Ambulatory Visit (HOSPITAL_COMMUNITY): Payer: Medicare Other | Attending: Cardiovascular Disease

## 2019-12-10 DIAGNOSIS — I10 Essential (primary) hypertension: Secondary | ICD-10-CM | POA: Diagnosis not present

## 2019-12-10 DIAGNOSIS — I509 Heart failure, unspecified: Secondary | ICD-10-CM | POA: Diagnosis not present

## 2019-12-10 LAB — ECHOCARDIOGRAM COMPLETE
Area-P 1/2: 4.6 cm2
S' Lateral: 3.9 cm

## 2019-12-11 NOTE — Telephone Encounter (Signed)
Normal LV function and no pericardial effusion by most recent echo on 8/6.

## 2019-12-13 NOTE — Telephone Encounter (Signed)
Left message to call back  

## 2019-12-16 ENCOUNTER — Encounter: Payer: Self-pay | Admitting: Internal Medicine

## 2019-12-22 NOTE — Telephone Encounter (Signed)
Attempted to contact patient again but there was no answer and VM did not pick up.

## 2019-12-23 NOTE — Telephone Encounter (Signed)
Patient is returning call.  °

## 2019-12-23 NOTE — Telephone Encounter (Signed)
Patient made aware of echo results. She will keep appointment on 8/24.

## 2019-12-27 NOTE — Progress Notes (Signed)
Cardiology Office Note   Date:  12/28/2019   ID:  [name], DOB Apr 10, 1936, MRN [id number]  PCP:  Biagio Borg, MD    No chief complaint on file.  AFib  Wt Readings from Last 3 Encounters:  12/28/19 (!) 312 lb 9.6 oz (141.8 kg)  04/27/19 295 lb (133.8 kg)  03/30/19 292 lb (132.5 kg)       History of Present Illness: [name] is a 84 y.o. female   with history of chronic atrial fib, morbid obesity, CAD s/p RCA stent 2000 with residual mild-mod disease of left system 2001, hyperlipidemia, asthma, breast CA, anxiety, arthritis, HTN, gout, OSA, hypothyroidism, lower extremity edema, bradycardia who presents for yearly follow-up.  Last cath 2001 showed 50% mD1, 20-25% LCx, 20-25% ISR of RCA stent. Last nuc 01/2011 showed small partially reversible defect in anterolateral area, could represent ischemia but could be due to breast attenuation, EF 67%. She was noted to be bradycardic in the 40s in 03/2017 so atenolol was stopped. She noticed after stopping atenolol that she would feel her heart pound faster when she did activity so low dose metoprolol 12.5 mg BID was added back.   She had an abscess in the left shin.  She has been told she had a vein problem.  She gets a cellulitis at times in the left shin and uses antibiotics. Rare leg swelling.   She was feeling well in 2020 and careful during the pandemic. Zetia stopped in 2020.  Repatha continued.   Since the last visit, she has had some anxiety issues that resolve with Xanax.  She has continued issues with asthma.   n July 2021, she had vascular congestion noted on CXR.  Echo was done and showed: "Left ventricular ejection fraction, by estimation, is 60 to 65%. The  left ventricle has normal function. The left ventricle has no regional  wall motion abnormalities. Left ventricular diastolic parameters are  indeterminate. Elevated left ventricular  end-diastolic pressure.  2. Right ventricular systolic function is normal.  The right ventricular  size is normal. There is moderately elevated pulmonary artery systolic  pressure.  3. Left atrial size was severely dilated.  4. Right atrial size was mildly dilated.  5. The mitral valve is normal in structure. Trivial mitral valve  regurgitation. No evidence of mitral stenosis.  6. The aortic valve is tricuspid. Aortic valve regurgitation is not  visualized. No aortic stenosis is present.  7. The inferior vena cava is normal in size with greater than 50%  respiratory variability, suggesting right atrial pressure of 3 mmHg."   Furosemide was given instead of HCTZ.    Past Medical History:  Diagnosis Date  . Allergy   . Anxiety   . Arthritis    no cartlidge in knees  . Asthma   . Bradycardia    a. atenolol stopped due to this, HR 40s.  . 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 CAD in native artery    a.  CAD s/p RCA stent 2000 with residual mild-mod disease of left system 2001.  Marland Kitchen Chronic atrial fibrillation ([place])   . COPD (chronic obstructive pulmonary disease) ([place])   . Essential hypertension 03/24/2015  . Gout    post operative  . Hx of radiation therapy 04/02/11 -05/20/11   left breast  . Hyperlipidemia   . Hypothyroidism 03/24/2015  . Incontinence of urine   . Malignant neoplasm of upper-outer quadrant of female  breast ([place]) 12/26/2010  . Osteoporosis 03/24/2015  . Peripheral neuropathy 03/24/2015  . Plantar fasciitis   . Sleep apnea     Past Surgical History:  Procedure Laterality Date  . BREAST LUMPECTOMY W/ NEEDLE LOCALIZATION  01/29/2011   Left with [facility] Dr Margot Chimes  . CHOLECYSTECTOMY  1955  . CORONARY ANGIOPLASTY WITH STENT PLACEMENT  2000  . DILATION AND CURETTAGE OF UTERUS  1976   and 1996  . HAND SURGERY  1992   tendons between thumb and forefinger  . SKIN BIOPSY     1994, 2006, 2008, 2010, 2011, 2011, 2012-  pre-cancerous  . TONSILLECTOMY  1955     Current Outpatient Medications  Medication Sig  Dispense Refill  . albuterol (PROVENTIL) (2.5 MG/3ML) 0.083% nebulizer solution Take 3 mLs (2.5 mg total) by nebulization every 6 (six) hours as needed for wheezing or shortness of breath. 75 mL 12  . albuterol (VENTOLIN HFA) 108 (90 Base) MCG/ACT inhaler INHALE 2 PUFFS BY MOUTH INTO THE LUNGS IF NEEDED FOR WHEEZING AND OR SHORTNESS OF BREATH 8.5 g 2  . allopurinol (ZYLOPRIM) 300 MG tablet Take 300 mg by mouth daily.  1  . ALPRAZolam (XANAX) 0.5 MG tablet TAKE 1 TO 2 TABLETS BY MOUTH TWICE DAILY AS NEEDED FOR ANXIETY OR SLEEP 60 tablet 2  . azelastine (ASTELIN) 0.1 % nasal spray Place 2 sprays into both nostrils daily. Use in each nostril as directed 30 mL 5  . azelastine (OPTIVAR) 0.05 % ophthalmic solution Place 1 drop into both eyes 2 (two) times daily. 6 mL 12  . B Complex Vitamins (VITAMIN B COMPLEX PO) Take 1 tablet by mouth daily.    . Betamethasone Sodium Phosphate 6 MG/ML SOLN INJECT 2:4 SYRINGE BILATERALLY INTO EACH KNEE JOINT    . Biotin 5000 MCG CAPS Take 5,000 mcg by mouth daily.     . cholecalciferol (VITAMIN D) 1000 UNITS tablet Take 1,000 Units by mouth daily.    Marland Kitchen co-enzyme Q-10 50 MG capsule Take 400 mg by mouth daily.    . colchicine (COLCRYS) 0.6 MG tablet Take 0.6 mg by mouth daily as needed (flare  up).     . Cyanocobalamin (VITAMIN B-12 PO) Take 1 tablet by mouth daily.    . cycloSPORINE (RESTASIS) 0.05 % ophthalmic emulsion Apply to eye as needed.    . diclofenac sodium (VOLTAREN) 1 % GEL Apply 4 g topically 4 (four) times daily as needed. 200 g 11  . EQL Natural Zinc 50 MG TABS Take 50 mg by mouth daily.    Marland Kitchen escitalopram (LEXAPRO) 10 MG tablet TAKE 1 TABLET(10 MG) BY MOUTH DAILY 90 tablet 3  . Evolocumab with Infusor ([place]) 420 MG/3.5ML SOCT Inject 1 Units into the skin every 30 (thirty) days. 3.5 mL 11  . fexofenadine (ALLEGRA) 180 MG tablet TAKE 1 TABLET BY MOUTH EVERY DAY 90 tablet 1  . furosemide (LASIX) 40 MG tablet Take 1 tablet (40 mg total)  by mouth daily. 90 tablet 3  . gabapentin (NEURONTIN) 300 MG capsule Take 1 capsule (300 mg total) by mouth 3 (three) times daily. (Patient taking differently: Take 300 mg by mouth daily. ) 270 capsule 1  . HYDROcodone-acetaminophen (NORCO/VICODIN) 5-325 MG tablet Take 1 tablet by mouth every 4 (four) hours as needed.    . levalbuterol (XOPENEX HFA) 45 MCG/ACT inhaler Inhale 2 puffs into the lungs every 6 (six) hours as needed for wheezing or shortness of breath. 3 Inhaler 3  . levocetirizine (  XYZAL) 5 MG tablet Take 1 tablet (5 mg total) by mouth every evening. 30 tablet 11  . levothyroxine (SYNTHROID) 75 MCG tablet TAKE 1 TABLET(75 MCG) BY MOUTH DAILY 90 tablet 1  . metoprolol succinate (TOPROL-XL) 25 MG 24 hr tablet TAKE 1/2 TABLET(12.5 MG) BY MOUTH DAILY 45 tablet 3  . montelukast (SINGULAIR) 10 MG tablet Take 1 tablet (10 mg total) by mouth at bedtime. 90 tablet 1  . mupirocin ointment (BACTROBAN) 2 % Use twice a day as directed 30 g 1  . nystatin cream (MYCOSTATIN) Apply 1 application topically daily as needed for dry skin.   0  . potassium chloride SA (KLOR-CON) 20 MEQ tablet Take 1 tablet (20 mEq total) by mouth daily. 90 tablet 3  . predniSONE (DELTASONE) 10 MG tablet 2 tabs by mouth per day for 10 days 20 tablet 0  . Pyridoxine HCl (VITAMIN B-6 PO) Take 1 tablet by mouth daily.    . rivaroxaban (XARELTO) 20 MG TABS tablet TAKE 1 TABLET(20 MG) BY MOUTH DAILY WITH SUPPER 90 tablet 1  . rosuvastatin (CRESTOR) 20 MG tablet Take 1 tablet (20 mg total) by mouth daily. 90 tablet 3  . Saccharomyces boulardii (FLORASTOR PO) Take 2 capsules by mouth daily.    Marland Kitchen tiZANidine (ZANAFLEX) 2 MG tablet Take 1 tablet (2 mg total) by mouth at bedtime. 90 tablet 1  . traMADol-acetaminophen (ULTRACET) 37.5-325 MG tablet Take 1-2 tablets by mouth every 6 (six) hours as needed for moderate pain or severe pain.   0  . triamcinolone (NASACORT AQ) 55 MCG/ACT AERO nasal inhaler Place 2 sprays daily into the nose. 1  Inhaler 12  . triamcinolone cream (KENALOG) 0.1 % Apply 1 application topically 2 (two) times daily. For 2 weeks, then as needed 80 g 2  . valACYclovir (VALTREX) 1000 MG tablet TAKE 2 TABLETS BY MOUTH TWICE DAILY FOR 1 DAY-TAKE DOSES 12 HOURS APART    . vitamin C (ASCORBIC ACID) 500 MG tablet Take 500 mg by mouth daily.    Marland Kitchen VITAMIN E PO Take 1 tablet by mouth daily.    Monte Fantasia INHUB 500-50 MCG/DOSE AEPB Inhale 1 puff into the lungs 2 (two) times daily. 60 each 11   No current facility-administered medications for this visit.    Allergies:   Crestor [rosuvastatin calcium], Hydrocodone-acetaminophen, Lidocaine hcl, Lipitor [atorvastatin calcium], Procaine, Rosuvastatin, Theophylline, Vicodin [hydrocodone-acetaminophen], Codeine, Oxycodone-acetaminophen, Propoxyphene, Quinine derivatives, Sulfa antibiotics, Atorvastatin, Ephedrine, Other, Oxycodone, Cephalexin, Epinephrine, Penicillins, and Theophyllines    Social History:  The patient  reports that she quit smoking about 57 years ago. She has a 4.00 pack-year smoking history. She has never used smokeless tobacco. She reports that she does not drink alcohol and does not use drugs.   Family History:  The patient's family history includes Asthma in her sister; Cancer in her father; Heart attack in her brother and father; Heart disease in her brother and father.    ROS:  Please see the history of present illness.   Otherwise, review of systems are positive for anxiety.   All other systems are reviewed and negative.    PHYSICAL EXAM: VS:  BP 134/74   Pulse 72   Ht 5\' 6"  (1.676 m)   Wt (!) 312 lb 9.6 oz (141.8 kg)   SpO2 94%   BMI 50.45 kg/m  , BMI Body mass index is 50.45 kg/m. GEN: Well nourished, well developed, in no acute distress  HEENT: normal  Neck: no JVD, carotid bruits, or  masses Cardiac: irregularly irregular; no murmurs, rubs, or gallops,no edema  Respiratory:  End expiratory wheezing to auscultation bilaterally, normal work  of breathing GI: soft, nontender, nondistended, + BS, obese MS: no deformity or atrophy  Skin: warm and dry, no rash Neuro:  Strength and sensation are intact Psych: euthymic mood, full affect   EKG:   The ekg ordered today demonstrates AFib, rate controlled, PVC   Recent Labs: 09/23/2019: TSH 1.75 11/15/2019: ALT 13; Brain Natriuretic Peptide 72; Hemoglobin 13.8; Platelets 168 11/24/2019: BUN 15; Creat 0.83; Potassium 3.4; Sodium 144   Lipid Panel    Component Value Date/Time   CHOL 191 09/23/2019 1234   CHOL 133 06/09/2019 0921   TRIG 109.0 09/23/2019 1234   HDL 67.00 09/23/2019 1234   HDL 57 06/09/2019 0921   CHOLHDL 3 09/23/2019 1234   VLDL 21.8 09/23/2019 1234   LDLCALC 102 (H) 09/23/2019 1234   LDLCALC 57 06/09/2019 0921   LDLDIRECT 107.0 09/17/2016 1221     Other studies Reviewed: Additional studies/ records that were reviewed today with results demonstrating: labs reviewed, K 3.4, in 7/21.   ASSESSMENT AND PLAN:  1. CHronic AFib: rate controlled.  Xarelto for stroke prevention.  2. Chronic diastolic heart failure: related to LVH and AFib.  Continue furosemide 40- 80 mg daily.  She has been taking 80 mg daily for several weeks.  She feels thirsty a lot. Cutting back on the diuretic may help.  3. Bradycardia: Resolved. 4. CAD: No ASA with Xarelto.  Continue aggressive secondary prevention.  5. Hyperlipidemia: Repatha, Crestor.  LDL 57. 6. BMet today to guide furosemide and potassium dosing.   Current medicines are reviewed at length with the patient today.  The patient concerns regarding her medicines were addressed.  The following changes have been made:  No change  Labs/ tests ordered today include:  No orders of the defined types were placed in this encounter.   Recommend 150 minutes/week of aerobic exercise Low fat, low carb, high fiber diet recommended  Disposition:   FU in 6 months   Signed, Larae Grooms, MD  12/28/2019 3:38 PM    [facility] [address], [place], [place]  [address] Phone: [phone]; Fax: [phone]

## 2019-12-28 ENCOUNTER — Encounter: Payer: Self-pay | Admitting: Interventional Cardiology

## 2019-12-28 ENCOUNTER — Other Ambulatory Visit: Payer: Self-pay

## 2019-12-28 ENCOUNTER — Ambulatory Visit: Payer: Medicare Other | Admitting: Interventional Cardiology

## 2019-12-28 VITALS — BP 134/74 | HR 72 | Ht 66.0 in | Wt 312.6 lb

## 2019-12-28 DIAGNOSIS — E782 Mixed hyperlipidemia: Secondary | ICD-10-CM

## 2019-12-28 DIAGNOSIS — I2510 Atherosclerotic heart disease of native coronary artery without angina pectoris: Secondary | ICD-10-CM | POA: Diagnosis not present

## 2019-12-28 DIAGNOSIS — R600 Localized edema: Secondary | ICD-10-CM | POA: Diagnosis not present

## 2019-12-28 DIAGNOSIS — I482 Chronic atrial fibrillation, unspecified: Secondary | ICD-10-CM

## 2019-12-28 DIAGNOSIS — I5032 Chronic diastolic (congestive) heart failure: Secondary | ICD-10-CM | POA: Diagnosis not present

## 2019-12-28 NOTE — Patient Instructions (Signed)
Medication Instructions:  Your physician recommends that you continue on your current medications as directed. Please refer to the Current Medication list given to you today.  *If you need a refill on your cardiac medications before your next appointment, please call your pharmacy*   Lab Work: TODAY: BMET  If you have labs (blood work) drawn today and your tests are completely normal, you will receive your results only by: . MyChart Message (if you have MyChart) OR . A paper copy in the mail If you have any lab test that is abnormal or we need to change your treatment, we will call you to review the results.   Testing/Procedures: None   Follow-Up: At [facility], you and your health needs are our priority.  As part of our continuing mission to provide you with exceptional heart care, we have created designated Provider Care Teams.  These Care Teams include your primary Cardiologist (physician) and Advanced Practice Providers (APPs -  Physician Assistants and Nurse Practitioners) who all work together to provide you with the care you need, when you need it.  We recommend signing up for the patient portal called "MyChart".  Sign up information is provided on this After Visit Summary.  MyChart is used to connect with patients for Virtual Visits (Telemedicine).  Patients are able to view lab/test results, encounter notes, upcoming appointments, etc.  Non-urgent messages can be sent to your provider as well.   To learn more about what you can do with MyChart, go to [email or web address].    Your next appointment:   6 month(s)  The format for your next appointment:   In Person  Provider:   You may see Jayadeep Varanasi, MD or one of the following Advanced Practice Providers on your designated Care Team:    Dayna Dunn, PA-C  Michele Lenze, PA-C    Other Instructions None  

## 2019-12-29 ENCOUNTER — Encounter: Payer: Self-pay | Admitting: Internal Medicine

## 2019-12-29 ENCOUNTER — Telehealth: Payer: Self-pay | Admitting: Interventional Cardiology

## 2019-12-29 DIAGNOSIS — J4540 Moderate persistent asthma, uncomplicated: Secondary | ICD-10-CM | POA: Diagnosis not present

## 2019-12-29 DIAGNOSIS — T63461A Toxic effect of venom of wasps, accidental (unintentional), initial encounter: Secondary | ICD-10-CM | POA: Diagnosis not present

## 2019-12-29 DIAGNOSIS — J3089 Other allergic rhinitis: Secondary | ICD-10-CM | POA: Diagnosis not present

## 2019-12-29 DIAGNOSIS — J301 Allergic rhinitis due to pollen: Secondary | ICD-10-CM | POA: Diagnosis not present

## 2019-12-29 LAB — BASIC METABOLIC PANEL
BUN/Creatinine Ratio: 14 (ref 12–28)
BUN: 13 mg/dL (ref 8–27)
CO2: 26 mmol/L (ref 20–29)
Calcium: 9.4 mg/dL (ref 8.7–10.3)
Chloride: 108 mmol/L — ABNORMAL HIGH (ref 96–106)
Creatinine, Ser: 0.93 mg/dL (ref 0.57–1.00)
GFR calc Af Amer: 66 mL/min/{1.73_m2} (ref >59)
GFR calc non Af Amer: 57 mL/min/{1.73_m2} — ABNORMAL LOW (ref >59)
Glucose: 81 mg/dL (ref 65–99)
Potassium: 4 mmol/L (ref 3.5–5.2)
Sodium: 147 mmol/L — ABNORMAL HIGH (ref 134–144)

## 2019-12-29 NOTE — Telephone Encounter (Signed)
Patient returning call for lab results. 

## 2019-12-29 NOTE — Telephone Encounter (Signed)
I s/w pt and apologized that I did not see that she had s/w one of the other nurses earlier today. Pt said that was fine and juts wanted to make sure that there was not anything new. Pt thanked me still for the call. The patient has been notified of the result and verbalized understanding.  All questions (if any) were answered. Julaine Hua, [facility] 12/29/2019 2:23 PM

## 2020-01-13 ENCOUNTER — Other Ambulatory Visit: Payer: Self-pay

## 2020-01-13 ENCOUNTER — Ambulatory Visit (INDEPENDENT_AMBULATORY_CARE_PROVIDER_SITE_OTHER): Payer: Medicare Other

## 2020-01-13 ENCOUNTER — Encounter: Payer: Self-pay | Admitting: Emergency Medicine

## 2020-01-13 ENCOUNTER — Ambulatory Visit: Payer: Medicare Other | Admitting: Emergency Medicine

## 2020-01-13 VITALS — BP 130/90 | HR 94 | Temp 97.2°F | Ht 66.0 in | Wt 309.2 lb

## 2020-01-13 DIAGNOSIS — G4733 Obstructive sleep apnea (adult) (pediatric): Secondary | ICD-10-CM | POA: Diagnosis not present

## 2020-01-13 DIAGNOSIS — R061 Stridor: Secondary | ICD-10-CM | POA: Diagnosis not present

## 2020-01-13 DIAGNOSIS — I509 Heart failure, unspecified: Secondary | ICD-10-CM

## 2020-01-13 DIAGNOSIS — J45909 Unspecified asthma, uncomplicated: Secondary | ICD-10-CM

## 2020-01-13 DIAGNOSIS — I10 Essential (primary) hypertension: Secondary | ICD-10-CM | POA: Diagnosis not present

## 2020-01-13 DIAGNOSIS — I517 Cardiomegaly: Secondary | ICD-10-CM | POA: Diagnosis not present

## 2020-01-13 DIAGNOSIS — J301 Allergic rhinitis due to pollen: Secondary | ICD-10-CM

## 2020-01-13 DIAGNOSIS — R0602 Shortness of breath: Secondary | ICD-10-CM | POA: Diagnosis not present

## 2020-01-13 NOTE — Assessment & Plan Note (Signed)
History of diastolic CHF.  Question whether this may be a component of dyspnea and poor control as well.  We will check a chest x-ray today to evaluate for any evidence of volume overload.

## 2020-01-13 NOTE — Assessment & Plan Note (Signed)
Good compliance with CPAP.  Good clinical benefit.  She will continue.

## 2020-01-13 NOTE — Assessment & Plan Note (Signed)
Singulair, Xyzal.  A new nasal spray was added by Dr. Remus Blake and will get the name of this.  She thinks that it is helping.

## 2020-01-13 NOTE — Progress Notes (Signed)
Subjective:    Patient ID: [name], female    DOB: Oct 29, 1935, 84 y.o.   MRN: [id number]  Asthma [name] complains of cough, shortness of breath and wheezing. Pertinent negatives include no ear pain, fever, headaches, postnasal drip, rhinorrhea, sneezing, sore throat or trouble swallowing. Her past medical history is significant for asthma.    ROV 12/Mercedes/19 --[name] is 84 years old, has a history of breast cancer, coronary disease, atrial fibrillation with some associated diastolic dysfunction.  [name] also is on CPAP for obstructive sleep apnea, has allergic rhinitis.  [name] is followed here for that and also for mild intermittent asthma with mixed obstruction and restriction by pulmonary function testing.  [name] is here for annual follow-up.  [name] reports that since last time [name] has been wearing her CPAP reliably, good repair, good clinical benefit. [name] stopped atenolol about a year ago - has tolerated well, in fact her breathing is better. [name] is still dealing with allergies - is still on Singulair, xyzal. [name] uses astelin prn. [name] was able to stop Symbicort and has been off since early 2019. Very rare albuterol use. [name] has not required pred or abx in the last yr. Fu shot up to date, PNA shots up to date.   ROV 01/13/20 --84 year old [name] with a minimal prior tobacco history, history of breast cancer, CAD, atrial fibrillation with diastolic dysfunction, OSA on CPAP and allergic rhinitis.  I have followed her for asthma.  Spirometry shows mixed obstruction and restriction.  At her last visit [name] had had some improvement in her asthma symptoms after stopping atenolol.  We was off Symbicort in late 2019.  Began to have increasing symptoms approximately early Spring 2021, possibly related to the timing of her COVID vaccine - wheeze, shortness of breath, minimal cough, some chest tightness. [name] was started back on Advair and then [employer]  Received steroid injection and pred prescription from Dr Jenny Reichmann in April  and May. [name] describes persistent exertional SOB, wheezing with exertion. Weight is stable. Denies any GERD sx.  Currently managed on Wixella, using albuterol rarely at this time.  Using Singulair and Xyzal. Another nasal spray was added by Dr Orvil Feil at a recent visit but [name] can't remember the name.   Compliance with her CPAP >> great compliance, follows with Dr Isidoro Donning.   Chest x-ray 11/15/2019 reviewed by me, shows mild cardiomegaly, mild interstitial prominence with vascular congestion, no consolidation or effusions.   Review of Systems  Constitutional: Negative for fever and unexpected weight change.  HENT: Negative for congestion, dental problem, ear pain, nosebleeds, postnasal drip, rhinorrhea, sinus pressure, sneezing, sore throat and trouble swallowing.   Eyes: Negative for redness and itching.  Respiratory: Positive for cough, chest tightness, shortness of breath and wheezing.   Cardiovascular: Negative for palpitations and leg swelling.  Gastrointestinal: Negative for nausea and vomiting.  Genitourinary: Negative for dysuria.  Musculoskeletal: Negative for joint swelling.  Skin: Negative for rash.  Neurological: Negative for headaches.  Hematological: Does not bruise/bleed easily.  Psychiatric/Behavioral: Negative for dysphoric mood. The patient is not nervous/anxious.     Past Medical History:  Diagnosis Date  . Allergy   . Anxiety   . Arthritis    no cartlidge in knees  . Asthma   . Bradycardia    a. atenolol stopped due to this, HR 40s.  . 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 CAD in native artery    a.  CAD s/p  RCA stent 2000 with residual mild-mod disease of left system 2001.  Marland Kitchen Chronic atrial fibrillation ([place])   . COPD (chronic obstructive pulmonary disease) ([place])   . Essential hypertension 03/24/2015  . Gout    post operative  . Hx of radiation therapy 04/02/11 -05/20/11   left breast  . Hyperlipidemia   . Hypothyroidism 03/24/2015    . Incontinence of urine   . Malignant neoplasm of upper-outer quadrant of female breast ([place]) 12/26/2010  . Osteoporosis 03/24/2015  . Peripheral neuropathy 03/24/2015  . Plantar fasciitis   . Sleep apnea      Family History  Problem Relation Age of Onset  . Heart disease Father   . Cancer Father        intestinal polyps  . Heart attack Father   . Asthma Sister   . Heart disease Brother   . Heart attack Brother      Social History   Socioeconomic History  . Marital status: Married    Spouse name: Not on file  . Number of children: Not on file  . Years of education: Not on file  . Highest education level: Not on file  Occupational History  . Not on file  Tobacco Use  . Smoking status: Former Smoker    Packs/day: 1.00    Years: 4.00    Pack years: 4.00    Quit date: 12/05/1962    Years since quitting: 57.1  . Smokeless tobacco: Never Used  Substance and Sexual Activity  . Alcohol use: No  . Drug use: No  . Sexual activity: Not on file  Other Topics Concern  . Not on file  Social History Narrative  . Not on file   Social Determinants of Health   Financial Resource Strain:   . Difficulty of Paying Living Expenses: Not on file  Food Insecurity:   . Worried About Charity fundraiser in the Last Year: Not on file  . Ran Out of Food in the Last Year: Not on file  Transportation Needs:   . Lack of Transportation (Medical): Not on file  . Lack of Transportation (Non-Medical): Not on file  Physical Activity:   . Days of Exercise per Week: Not on file  . Minutes of Exercise per Session: Not on file  Stress:   . Feeling of Stress : Not on file  Social Connections:   . Frequency of Communication with Friends and Family: Not on file  . Frequency of Social Gatherings with Friends and Family: Not on file  . Attends Religious Services: Not on file  . Active Member of Clubs or Organizations: Not on file  . Attends Archivist Meetings: Not on file  . Marital  Status: Not on file  Intimate Partner Violence:   . Fear of Current or Ex-Partner: Not on file  . Emotionally Abused: Not on file  . Physically Abused: Not on file  . Sexually Abused: Not on file  No smoker exposure.  No occupational exposures  Allergies  Allergen Reactions  . Crestor [Rosuvastatin Calcium]     Severe liver function problems  . Hydrocodone-Acetaminophen Anxiety and Itching  . Lidocaine Hcl Other (See Comments)    Novacaine:  Becomes very shaky  . Lipitor [Atorvastatin Calcium] Other (See Comments)    Elevated liver function   . Procaine Other (See Comments)    shaking  . Rosuvastatin Other (See Comments)    Severe liver function problems  . Theophylline Other (See Comments)  Becomes very shaky skaking skaking  . Vicodin [Hydrocodone-Acetaminophen] Itching    Itching all over  . Codeine Nausea Only, Anxiety and Other (See Comments)    Also complained of dizziness.  Has same problems with oxycodone and hydrocodone Visual Disturbance, Balance Difficulty, Dizzy "Room Spinning; "Swimming" Balance, vision, nausea, dizzy, "swimming", "room spinning" Balance, vision, nausea, dizzy, "swimming", "room spinning"  . Oxycodone-Acetaminophen Nausea Only, Anxiety and Other (See Comments)    Visual Disturbance, Balance Difficulty, Dizzy "Room Spinning; "Swimming"  . Propoxyphene Anxiety, Nausea Only and Other (See Comments)    Visual Disturbance, Balance Difficulty, Dizzy "Room Spinning; "Swimming" Balance, vision, nausea, dizzy, "swimming", "room spinning" Balance, vision, nausea, dizzy, "swimming, "room spinning"  . Quinine Derivatives Nausea Only    dizzy  . Sulfa Antibiotics Nausea Only    Also complained of dizziness  . Atorvastatin Other (See Comments)    Severe liver function problems  . Ephedrine Other (See Comments)    shaking  . Other Other (See Comments)    Quinine causes nausea, dizzy  . Oxycodone Other (See Comments)    Balance, vision, nausea, dizzy,  "swimming", "room spinning"  . Cephalexin Rash  . Epinephrine Other (See Comments)    Patient becomes "shaky" shaking shaking  . Penicillins Rash    Pt reported all over body rash in 1958 Has patient had a PCN reaction causing immediate rash, facial/tongue/throat swelling, SOB or lightheadedness with hypotension:Yes Has patient had a PCN reaction causing severe rash involving mucus membranes or skin necrosis: No Has patient had a PCN reaction that required hospitalization: No Has patient had a PCN reaction occurring within the last 10 years:No    . Theophyllines Other (See Comments)    Patient becomes "shaky"     Outpatient Medications Prior to Visit  Medication Sig Dispense Refill  . albuterol (VENTOLIN HFA) 108 (90 Base) MCG/ACT inhaler INHALE Mercedes PUFFS BY MOUTH INTO THE LUNGS IF NEEDED FOR WHEEZING AND OR SHORTNESS OF BREATH 8.5 g Mercedes  . ALPRAZolam (XANAX) 0.5 MG tablet TAKE 1 TO Mercedes TABLETS BY MOUTH TWICE DAILY AS NEEDED FOR ANXIETY OR SLEEP 60 tablet Mercedes  . azelastine (OPTIVAR) 0.05 % ophthalmic solution Place 1 drop into both eyes Mercedes (two) times daily. 6 mL 12  . Betamethasone Sodium Phosphate 6 MG/ML SOLN INJECT Mercedes:4 SYRINGE BILATERALLY INTO EACH KNEE JOINT    . escitalopram (LEXAPRO) 10 MG tablet TAKE 1 TABLET(10 MG) BY MOUTH DAILY 90 tablet 3  . fexofenadine (ALLEGRA) 180 MG tablet TAKE 1 TABLET BY MOUTH EVERY DAY 90 tablet 1  . HYDROcodone-acetaminophen (NORCO/VICODIN) 5-325 MG tablet Take 1 tablet by mouth every 4 (four) hours as needed.    . levalbuterol (XOPENEX HFA) 45 MCG/ACT inhaler Inhale Mercedes puffs into the lungs every 6 (six) hours as needed for wheezing or shortness of breath. 3 Inhaler 3  . levocetirizine (XYZAL) 5 MG tablet Take 1 tablet (5 mg total) by mouth every evening. 30 tablet 11  . metoprolol succinate (TOPROL-XL) 25 MG 24 hr tablet TAKE 1/Mercedes TABLET(12.5 MG) BY MOUTH DAILY 45 tablet 3  . montelukast (SINGULAIR) 10 MG tablet Take 1 tablet (10 mg total) by mouth at  bedtime. 90 tablet 1  . predniSONE (DELTASONE) 10 MG tablet Mercedes tabs by mouth per day for 10 days 20 tablet 0  . rivaroxaban (XARELTO) 20 MG TABS tablet TAKE 1 TABLET(20 MG) BY MOUTH DAILY WITH SUPPER 90 tablet 1  . rosuvastatin (CRESTOR) 20 MG tablet Take 1 tablet (20 mg total)  by mouth daily. 90 tablet 3  . tiZANidine (ZANAFLEX) Mercedes MG tablet Take 1 tablet (Mercedes mg total) by mouth at bedtime. 90 tablet 1  . valACYclovir (VALTREX) 1000 MG tablet TAKE Mercedes TABLETS BY MOUTH TWICE DAILY FOR 1 DAY-TAKE DOSES 12 HOURS APART    . WIXELA INHUB 500-50 MCG/DOSE AEPB Inhale 1 puff into the lungs Mercedes (two) times daily. 60 each 11  . albuterol (PROVENTIL) (Mercedes.5 MG/3ML) 0.083% nebulizer solution Take 3 mLs (Mercedes.5 mg total) by nebulization every 6 (six) hours as needed for wheezing or shortness of breath. 75 mL 12  . allopurinol (ZYLOPRIM) 300 MG tablet Take 300 mg by mouth daily.  1  . azelastine (ASTELIN) 0.1 % nasal spray Place Mercedes sprays into both nostrils daily. Use in each nostril as directed 30 mL 5  . B Complex Vitamins (VITAMIN B COMPLEX PO) Take 1 tablet by mouth daily.    . Biotin 5000 MCG CAPS Take 5,000 mcg by mouth daily.     . cholecalciferol (VITAMIN D) 1000 UNITS tablet Take 1,000 Units by mouth daily.    Marland Kitchen co-enzyme Q-10 50 MG capsule Take 400 mg by mouth daily.    . colchicine (COLCRYS) 0.6 MG tablet Take 0.6 mg by mouth daily as needed (flare  up).     . Cyanocobalamin (VITAMIN B-12 PO) Take 1 tablet by mouth daily.    . cycloSPORINE (RESTASIS) 0.05 % ophthalmic emulsion Apply to eye as needed.    . diclofenac sodium (VOLTAREN) 1 % GEL Apply 4 g topically 4 (four) times daily as needed. 200 g 11  . EQL Natural Zinc 50 MG TABS Take 50 mg by mouth daily.    . Evolocumab with Infusor ([place]) 420 MG/3.5ML SOCT Inject 1 Units into the skin every 30 (thirty) days. 3.5 mL 11  . furosemide (LASIX) 40 MG tablet Take 1 tablet (40 mg total) by mouth daily. 90 tablet 3  . gabapentin (NEURONTIN)  300 MG capsule Take 1 capsule (300 mg total) by mouth 3 (three) times daily. (Patient taking differently: Take 300 mg by mouth daily. ) 270 capsule 1  . levothyroxine (SYNTHROID) 75 MCG tablet TAKE 1 TABLET(75 MCG) BY MOUTH DAILY 90 tablet 1  . mupirocin ointment (BACTROBAN) Mercedes % Use twice a day as directed 30 g 1  . nystatin cream (MYCOSTATIN) Apply 1 application topically daily as needed for dry skin.   0  . potassium chloride SA (KLOR-CON) 20 MEQ tablet Take 1 tablet (20 mEq total) by mouth daily. 90 tablet 3  . Pyridoxine HCl (VITAMIN B-6 PO) Take 1 tablet by mouth daily.    . Saccharomyces boulardii (FLORASTOR PO) Take Mercedes capsules by mouth daily.    . traMADol-acetaminophen (ULTRACET) 37.5-325 MG tablet Take 1-Mercedes tablets by mouth every 6 (six) hours as needed for moderate pain or severe pain.   0  . triamcinolone (NASACORT AQ) 55 MCG/ACT AERO nasal inhaler Place Mercedes sprays daily into the nose. 1 Inhaler 12  . triamcinolone cream (KENALOG) 0.1 % Apply 1 application topically Mercedes (two) times daily. For Mercedes weeks, then as needed 80 g Mercedes  . vitamin C (ASCORBIC ACID) 500 MG tablet Take 500 mg by mouth daily.    Marland Kitchen VITAMIN E PO Take 1 tablet by mouth daily.     No facility-administered medications prior to visit.        Objective:   Physical Exam Vitals:   01/13/20 1018  BP: 130/90  Pulse: 94  Temp: (!) 97.Mercedes F (36.Mercedes C)  TempSrc: Temporal  SpO2: 96%  Weight: (!) 309 lb 3.Mercedes oz (140.3 kg)  Height: $Remove'5\' 6"'ONkmtMP$  (1.676 m)   Gen: Pleasant, Obese [name], in no distress,  normal affect  ENT: No lesions,  mouth clear,  oropharynx clear, no active nasal drip  Neck: No JVD, loud exp stridor through the entire exhalation.   Lungs: No use of accessory muscles, no apparent wheeze, some referred UA noise.   Cardiovascular: Irregular, distant  Musculoskeletal: No deformities, no cyanosis or clubbing  Neuro: alert, non focal  Skin: Warm, no lesions or rashes      Assessment & Plan:  Asthma Overt  stridor today on evaluation.  Suspect that [name] has both asthma and upper airway irritation syndrome.  The latter appears to be driving her symptoms at this time.  There is been some improvement since [name] got better control of her allergic rhinitis, saw Dr. Remus Blake.  [name] needs pulmonary function testing to quantify her degree of obstruction.  We need to try and address all potential upper airway irritants.  [name] will continue Wixela for now, we may decide to adjust going forward.  Also asked her to keep track of whether her albuterol helps symptoms.  I suspect that it would not impact upper airway disease significantly or consistently.  [name] denies any GERD but it may be reasonable to treat this empirically at some point going forward depending on her degree of control.  Allergic rhinitis Singulair, Xyzal.  A new nasal spray was added by Dr. Remus Blake and will get the name of this.  [name] thinks that it is helping.  OSA (obstructive sleep apnea) Good compliance with CPAP.  Good clinical benefit.  [name] will continue.  CHF (congestive heart failure) (HCC) History of diastolic CHF.  Question whether this may be a component of dyspnea and poor control as well.  We will check a chest x-ray today to evaluate for any evidence of volume overload.   Baltazar Apo, MD, PhD 01/13/2020, 10:45 AM [facility] [phone] or if no answer [phone]

## 2020-01-13 NOTE — Patient Instructions (Addendum)
We will repeat your pulmonary function testing to compare with prior.   Continue Wixela twice a day.  Rinse and gargle after using. Keep your albuterol available to use 2 puffs when needed for shortness of breath, chest tightness, wheezing.  Keep track of whether this helps your symptoms Continue Singulair, Xyzal.  Call our office to let us know which nasal spray was recently started by Dr. Remus Blake to the we can add to your medication list.  Continue this as well. Chest x-ray today. Continue your CPAP every night as you have been using it. Follow Dr. Lamonte Sakai next available with PFT on the same day

## 2020-01-13 NOTE — Addendum Note (Signed)
Addended by: Vanessa Barbara on: 01/13/2020 10:53 AM   Modules accepted: Orders

## 2020-01-13 NOTE — Assessment & Plan Note (Signed)
Overt stridor today on evaluation.  Suspect that she has both asthma and upper airway irritation syndrome.  The latter appears to be driving her symptoms at this time.  There is been some improvement since she got better control of her allergic rhinitis, saw Dr. Remus Blake.  She needs pulmonary function testing to quantify her degree of obstruction.  We need to try and address all potential upper airway irritants.  She will continue Wixela for now, we may decide to adjust going forward.  Also asked her to keep track of whether her albuterol helps symptoms.  I suspect that it would not impact upper airway disease significantly or consistently.  She denies any GERD but it may be reasonable to treat this empirically at some point going forward depending on her degree of control.

## 2020-01-14 NOTE — Telephone Encounter (Signed)
Dr. Lamonte Sakai please advise on patient mychart message  Dr Orvil Feil  gave me [facility] fluticasone nasal spray, 49mcg; use 1 spray in am. You should hear my wheezing today--heavy  in upper and mid-chest. Also coughing up stuff which does not come out. This is the way it goes-- some days heavy, some days not much, some days chest so tight I can't breathe deeply.. Today is one of the days I can breathe deeply with large breaths.  I think it was tight yesterday because I went outside; my fall environmental allergies are here.  Thank you for the time you spent with me.

## 2020-01-17 ENCOUNTER — Telehealth: Payer: Self-pay | Admitting: Internal Medicine

## 2020-01-17 NOTE — Telephone Encounter (Signed)
LVM for pt to rtn my call to schedule AWV with [facility].  

## 2020-01-17 NOTE — Telephone Encounter (Signed)
It sounds like her sx go up and down depending on her allergic exposures. If possible she should wear a mask even outside. Also, stay on the fluticasone if she is tolerating it. Hopefully her symptoms have calmed down some now.

## 2020-01-21 ENCOUNTER — Other Ambulatory Visit: Payer: Self-pay

## 2020-01-21 ENCOUNTER — Ambulatory Visit (INDEPENDENT_AMBULATORY_CARE_PROVIDER_SITE_OTHER): Payer: Medicare Other | Admitting: Internal Medicine

## 2020-01-21 ENCOUNTER — Encounter: Payer: Self-pay | Admitting: Internal Medicine

## 2020-01-21 VITALS — BP 120/70 | HR 70 | Temp 98.4°F | Ht 66.0 in

## 2020-01-21 DIAGNOSIS — R1310 Dysphagia, unspecified: Secondary | ICD-10-CM | POA: Diagnosis not present

## 2020-01-21 DIAGNOSIS — R739 Hyperglycemia, unspecified: Secondary | ICD-10-CM | POA: Diagnosis not present

## 2020-01-21 DIAGNOSIS — Z23 Encounter for immunization: Secondary | ICD-10-CM | POA: Diagnosis not present

## 2020-01-21 DIAGNOSIS — I10 Essential (primary) hypertension: Secondary | ICD-10-CM

## 2020-01-21 DIAGNOSIS — F329 Major depressive disorder, single episode, unspecified: Secondary | ICD-10-CM

## 2020-01-21 DIAGNOSIS — F419 Anxiety disorder, unspecified: Secondary | ICD-10-CM

## 2020-01-21 NOTE — Patient Instructions (Signed)
You will be contacted regarding the referral for: testing for the swallowing  You had the flu shot today  Please continue all other medications as before, and refills have been done if requested.  Please have the pharmacy call with any other refills you may need.  Please continue your efforts at being more active, low cholesterol diet, and weight control.  Please keep your appointments with your specialists as you may have planned  Please make an Appointment to return in 3 months.

## 2020-01-21 NOTE — Progress Notes (Signed)
Subjective:    Patient ID: [name], female    DOB: 1935/10/21, 84 y.o.   MRN: [id number]  HPI  Here to f/u "asthma" as she still believes she has this despite reassurance per Dr Lamonte Sakai per pt, and wondering if her stridor is going to hurt her heart as well.  Has/ f/u testing soon, and follow with allergy and card as well.  Also c/o dysphagia in the pharyngeal area, asks for testing for this to "cover all the bases"  Denies worsening depressive symptoms, suicidal ideation, or panic; has ongoing anxiety.  Pt denies chest pain, orthopnea, PND, increased LE swelling, palpitations, dizziness or syncope. Pt denies new neurological symptoms such as new headache, or facial or extremity weakness or numbness   Pt denies polydipsia, polyuria,  Due for flu shot Past Medical History:  Diagnosis Date  . Allergy   . Anxiety   . Arthritis    no cartlidge in knees  . Asthma   . Bradycardia    a. atenolol stopped due to this, HR 40s.  . 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 CAD in native artery    a.  CAD s/p RCA stent 2000 with residual mild-mod disease of left system 2001.  Marland Kitchen Chronic atrial fibrillation ([place])   . COPD (chronic obstructive pulmonary disease) ([place])   . Essential hypertension 03/24/2015  . Gout    post operative  . Hx of radiation therapy 04/02/11 -05/20/11   left breast  . Hyperlipidemia   . Hypothyroidism 03/24/2015  . Incontinence of urine   . Malignant neoplasm of upper-outer quadrant of female breast ([place]) 12/26/2010  . Osteoporosis 03/24/2015  . Peripheral neuropathy 03/24/2015  . Plantar fasciitis   . Sleep apnea    Past Surgical History:  Procedure Laterality Date  . BREAST LUMPECTOMY W/ NEEDLE LOCALIZATION  01/29/2011   Left with [facility] Dr Margot Chimes  . CHOLECYSTECTOMY  1955  . CORONARY ANGIOPLASTY WITH STENT PLACEMENT  2000  . DILATION AND CURETTAGE OF UTERUS  1976   and 1996  . HAND SURGERY  1992   tendons between thumb and forefinger  . SKIN  BIOPSY     1994, 2006, 2008, 2010, 2011, 2011, 2012-  pre-cancerous  . TONSILLECTOMY  1955    reports that she quit smoking about 57 years ago. She has a 4.00 pack-year smoking history. She has never used smokeless tobacco. She reports that she does not drink alcohol and does not use drugs. family history includes Asthma in her sister; Cancer in her father; Heart attack in her brother and father; Heart disease in her brother and father. Allergies  Allergen Reactions  . Crestor [Rosuvastatin Calcium]     Severe liver function problems  . Hydrocodone-Acetaminophen Anxiety and Itching  . Lidocaine Hcl Other (See Comments)    Novacaine:  Becomes very shaky  . Lipitor [Atorvastatin Calcium] Other (See Comments)    Elevated liver function   . Procaine Other (See Comments)    shaking  . Rosuvastatin Other (See Comments)    Severe liver function problems  . Theophylline Other (See Comments)    Becomes very shaky skaking skaking  . Vicodin [Hydrocodone-Acetaminophen] Itching    Itching all over  . Codeine Nausea Only, Anxiety and Other (See Comments)    Also complained of dizziness.  Has same problems with oxycodone and hydrocodone Visual Disturbance, Balance Difficulty, Dizzy "Room Spinning; "Swimming" Balance, vision, nausea, dizzy, "swimming", "room spinning" Balance, vision, nausea,  dizzy, "swimming", "room spinning"  . Oxycodone-Acetaminophen Nausea Only, Anxiety and Other (See Comments)    Visual Disturbance, Balance Difficulty, Dizzy "Room Spinning; "Swimming"  . Propoxyphene Anxiety, Nausea Only and Other (See Comments)    Visual Disturbance, Balance Difficulty, Dizzy "Room Spinning; "Swimming" Balance, vision, nausea, dizzy, "swimming", "room spinning" Balance, vision, nausea, dizzy, "swimming, "room spinning"  . Quinine Derivatives Nausea Only    dizzy  . Sulfa Antibiotics Nausea Only    Also complained of dizziness  . Atorvastatin Other (See Comments)    Severe liver  function problems  . Ephedrine Other (See Comments)    shaking  . Other Other (See Comments)    Quinine causes nausea, dizzy  . Oxycodone Other (See Comments)    Balance, vision, nausea, dizzy, "swimming", "room spinning"  . Cephalexin Rash  . Epinephrine Other (See Comments)    Patient becomes "shaky" shaking shaking  . Penicillins Rash    Pt reported all over body rash in 1958 Has patient had a PCN reaction causing immediate rash, facial/tongue/throat swelling, SOB or lightheadedness with hypotension:Yes Has patient had a PCN reaction causing severe rash involving mucus membranes or skin necrosis: No Has patient had a PCN reaction that required hospitalization: No Has patient had a PCN reaction occurring within the last 10 years:No    . Theophyllines Other (See Comments)    Patient becomes "shaky"   Current Outpatient Medications on File Prior to Visit  Medication Sig Dispense Refill  . albuterol (PROVENTIL) (2.5 MG/3ML) 0.083% nebulizer solution Take 3 mLs (2.5 mg total) by nebulization every 6 (six) hours as needed for wheezing or shortness of breath. 75 mL 12  . albuterol (VENTOLIN HFA) 108 (90 Base) MCG/ACT inhaler INHALE 2 PUFFS BY MOUTH INTO THE LUNGS IF NEEDED FOR WHEEZING AND OR SHORTNESS OF BREATH 8.5 g 2  . allopurinol (ZYLOPRIM) 300 MG tablet Take 300 mg by mouth daily.  1  . ALPRAZolam (XANAX) 0.5 MG tablet TAKE 1 TO 2 TABLETS BY MOUTH TWICE DAILY AS NEEDED FOR ANXIETY OR SLEEP 60 tablet 2  . azelastine (ASTELIN) 0.1 % nasal spray Place 2 sprays into both nostrils daily. Use in each nostril as directed 30 mL 5  . azelastine (OPTIVAR) 0.05 % ophthalmic solution Place 1 drop into both eyes 2 (two) times daily. 6 mL 12  . Azelastine HCl 0.15 % SOLN SMARTSIG:1 Spray(s) Both Nares Every Evening    . B Complex Vitamins (VITAMIN B COMPLEX PO) Take 1 tablet by mouth daily.    . Betamethasone Sodium Phosphate 6 MG/ML SOLN INJECT 2:4 SYRINGE BILATERALLY INTO EACH KNEE JOINT    .  Biotin 5000 MCG CAPS Take 5,000 mcg by mouth daily.     . cholecalciferol (VITAMIN D) 1000 UNITS tablet Take 1,000 Units by mouth daily.    Marland Kitchen co-enzyme Q-10 50 MG capsule Take 400 mg by mouth daily.    . colchicine (COLCRYS) 0.6 MG tablet Take 0.6 mg by mouth daily as needed (flare  up).     . Cyanocobalamin (VITAMIN B-12 PO) Take 1 tablet by mouth daily.    . cycloSPORINE (RESTASIS) 0.05 % ophthalmic emulsion Apply to eye as needed.    . diclofenac sodium (VOLTAREN) 1 % GEL Apply 4 g topically 4 (four) times daily as needed. 200 g 11  . EPINEPHrine 0.3 mg/0.3 mL IJ SOAJ injection Inject into the muscle.    Marland Kitchen EQL Natural Zinc 50 MG TABS Take 50 mg by mouth daily.    Marland Kitchen  escitalopram (LEXAPRO) 10 MG tablet TAKE 1 TABLET(10 MG) BY MOUTH DAILY 90 tablet 3  . Evolocumab with Infusor ([place]) 420 MG/3.5ML SOCT Inject 1 Units into the skin every 30 (thirty) days. 3.5 mL 11  . fexofenadine (ALLEGRA) 180 MG tablet TAKE 1 TABLET BY MOUTH EVERY DAY 90 tablet 1  . fluticasone (FLONASE) 50 MCG/ACT nasal spray Place 1 spray into both nostrils every morning.    . furosemide (LASIX) 40 MG tablet Take 1 tablet (40 mg total) by mouth daily. 90 tablet 3  . gabapentin (NEURONTIN) 300 MG capsule Take 1 capsule (300 mg total) by mouth 3 (three) times daily. (Patient taking differently: Take 300 mg by mouth daily. ) 270 capsule 1  . levalbuterol (XOPENEX HFA) 45 MCG/ACT inhaler Inhale 2 puffs into the lungs every 6 (six) hours as needed for wheezing or shortness of breath. 3 Inhaler 3  . levocetirizine (XYZAL) 5 MG tablet Take 1 tablet (5 mg total) by mouth every evening. 30 tablet 11  . levothyroxine (SYNTHROID) 75 MCG tablet TAKE 1 TABLET(75 MCG) BY MOUTH DAILY 90 tablet 1  . metoprolol succinate (TOPROL-XL) 25 MG 24 hr tablet TAKE 1/2 TABLET(12.5 MG) BY MOUTH DAILY 45 tablet 3  . montelukast (SINGULAIR) 10 MG tablet Take 1 tablet (10 mg total) by mouth at bedtime. 90 tablet 1  . mupirocin ointment  (BACTROBAN) 2 % Use twice a day as directed 30 g 1  . nystatin cream (MYCOSTATIN) Apply 1 application topically daily as needed for dry skin.   0  . potassium chloride SA (KLOR-CON) 20 MEQ tablet Take 1 tablet (20 mEq total) by mouth daily. 90 tablet 3  . Pyridoxine HCl (VITAMIN B-6 PO) Take 1 tablet by mouth daily.    . rivaroxaban (XARELTO) 20 MG TABS tablet TAKE 1 TABLET(20 MG) BY MOUTH DAILY WITH SUPPER 90 tablet 1  . Saccharomyces boulardii (FLORASTOR PO) Take 2 capsules by mouth daily.    Marland Kitchen tiZANidine (ZANAFLEX) 2 MG tablet Take 1 tablet (2 mg total) by mouth at bedtime. 90 tablet 1  . traMADol-acetaminophen (ULTRACET) 37.5-325 MG tablet Take 1-2 tablets by mouth every 6 (six) hours as needed for moderate pain or severe pain.   0  . triamcinolone (NASACORT AQ) 55 MCG/ACT AERO nasal inhaler Place 2 sprays daily into the nose. 1 Inhaler 12  . triamcinolone cream (KENALOG) 0.1 % Apply 1 application topically 2 (two) times daily. For 2 weeks, then as needed 80 g 2  . valACYclovir (VALTREX) 1000 MG tablet TAKE 2 TABLETS BY MOUTH TWICE DAILY FOR 1 DAY-TAKE DOSES 12 HOURS APART    . vitamin C (ASCORBIC ACID) 500 MG tablet Take 500 mg by mouth daily.    Marland Kitchen VITAMIN E PO Take 1 tablet by mouth daily.    Grant Ruts INHUB 500-50 MCG/DOSE AEPB Inhale 1 puff into the lungs 2 (two) times daily. 60 each 11   No current facility-administered medications on file prior to visit.   Review of Systems All otherwise neg per pt    Objective:   Physical Exam BP 120/70 (BP Location: Left Arm, Patient Position: Sitting, Cuff Size: Large)   Pulse 70   Temp 98.4 F (36.9 C) (Oral)   Ht _0  (1.676 m)   SpO2 95%   BMI 49.91 kg/m  VS noted, super morbid obese Constitutional: Pt appears in NAD HENT: Head: NCAT.  Right Ear: External ear normal.  Left Ear: External ear normal.  Eyes: . Pupils are  equal, round, and reactive to light. Conjunctivae and EOM are normal Nose: without d/c or deformity Neck: Neck  supple. Gross normal ROM Cardiovascular: Normal rate and regular rhythm.   Pulmonary/Chest: Effort normal and breath sounds without rales or wheezing. except for stridorous sounds on forced exhale only Abd:  Soft, NT, ND, + BS, no organomegaly Neurological: Pt is alert. At baseline orientation, motor grossly intact Skin: Skin is warm. No rashes, other new lesions, no LE edema Psychiatric: Pt behavior is normal without agitation , 2+ nervous All otherwise neg per pt Lab Results  Component Value Date   WBC 6.8 11/15/2019   HGB 13.8 11/15/2019   HCT 41.9 11/15/2019   PLT 168 11/15/2019   GLUCOSE 81 12/28/2019   CHOL 191 09/23/2019   TRIG 109.0 09/23/2019   HDL 67.00 09/23/2019   LDLDIRECT 107.0 09/17/2016   LDLCALC 102 (H) 09/23/2019   ALT 13 11/15/2019   AST 17 11/15/2019   NA 147 (H) 12/28/2019   K 4.0 12/28/2019   CL 108 (H) 12/28/2019   CREATININE 0.93 12/28/2019   BUN 13 12/28/2019   CO2 26 12/28/2019   TSH 1.75 09/23/2019   HGBA1C 6.2 09/23/2019      Assessment & Plan:

## 2020-01-22 ENCOUNTER — Encounter: Payer: Self-pay | Admitting: Internal Medicine

## 2020-01-22 NOTE — Assessment & Plan Note (Signed)
Chronic ongoing, declines increased tx for now

## 2020-01-22 NOTE — Assessment & Plan Note (Addendum)
Etiology unclear, for barium swallow  I spent 31 minutes in preparing to see the patient by review of recent labs, imaging and procedures, obtaining and reviewing separately obtained history, communicating with the patient and family or caregiver, ordering medications, tests or procedures, and documenting clinical information in the EHR including the differential Dx, treatment, and any further evaluation and other management of dysphagia, anxiety, htn, hyperglycemia, htn

## 2020-01-22 NOTE — Assessment & Plan Note (Signed)
stable overall by history and exam, recent data reviewed with pt, and pt to continue medical treatment as before,  to f/u any worsening symptoms or concerns  

## 2020-01-26 ENCOUNTER — Other Ambulatory Visit: Payer: Self-pay | Admitting: Interventional Cardiology

## 2020-01-26 NOTE — Telephone Encounter (Signed)
Xarelto 20mg  refill request received. Pt is 84 years old, weight-140.3kg, Crea-0.93 on 12/28/2019, last seen by Dr. Irish Lack on 12/28/2019, Diagnosis-Afib, CrCl-103.22ml/min; Dose is appropriate based on dosing criteria. Will send in refill to requested pharmacy.

## 2020-01-31 DIAGNOSIS — M1712 Unilateral primary osteoarthritis, left knee: Secondary | ICD-10-CM | POA: Diagnosis not present

## 2020-01-31 DIAGNOSIS — M1711 Unilateral primary osteoarthritis, right knee: Secondary | ICD-10-CM | POA: Diagnosis not present

## 2020-02-08 ENCOUNTER — Ambulatory Visit (INDEPENDENT_AMBULATORY_CARE_PROVIDER_SITE_OTHER): Payer: Medicare Other

## 2020-02-08 DIAGNOSIS — Z0000 Encounter for general adult medical examination without abnormal findings: Secondary | ICD-10-CM

## 2020-02-08 NOTE — Progress Notes (Addendum)
I connected with [name] today by telephone and verified that I am speaking with the correct person using two identifiers. Location patient: home Location provider: work Persons participating in the virtual visit: [name] and Lisette Abu, LPN.   I discussed the limitations, risks, security and privacy concerns of performing an evaluation and management service by telephone and the availability of in person appointments. I also discussed with the patient that there may be a patient responsible charge related to this service. The patient expressed understanding and verbally consented to this telephonic visit.    Interactive audio and video telecommunications were attempted between this provider and patient, however failed, due to patient having technical difficulties OR patient did not have access to video capability.  We continued and completed visit with audio only.  Some vital signs may be absent or patient reported.   Time Spent with patient on telephone encounter: 30 minutes  Subjective:   [name] is a 84 y.o. female who presents for Medicare Annual (Subsequent) preventive examination.  Review of Systems    No ROS. Medicare Wellness Visit. Cardiac Risk Factors include: advanced age (>22men, >22 women);family history of premature cardiovascular disease;hypertension;obesity (BMI >30kg/m2)     Objective:    There were no vitals filed for this visit. There is no height or weight on file to calculate BMI.  Advanced Directives 02/08/2020 05/18/2017 10/12/2015 03/06/2015  Does Patient Have a Medical Advance Directive? Yes Yes Yes No  Type of Paramedic of [place];Living will [place];Living will - -  Does patient want to make changes to medical advance directive? No - Patient declined - - -  Copy of [place] in Chart? No - copy requested - - -  Would patient like information on creating a medical advance  directive? - - - No - patient declined information    Current Medications (verified) Outpatient Encounter Medications as of 02/08/2020  Medication Sig   albuterol (PROVENTIL) (2.5 MG/3ML) 0.083% nebulizer solution Take 3 mLs (2.5 mg total) by nebulization every 6 (six) hours as needed for wheezing or shortness of breath.   albuterol (VENTOLIN HFA) 108 (90 Base) MCG/ACT inhaler INHALE 2 PUFFS BY MOUTH INTO THE LUNGS IF NEEDED FOR WHEEZING AND OR SHORTNESS OF BREATH   allopurinol (ZYLOPRIM) 300 MG tablet Take 300 mg by mouth daily.   ALPRAZolam (XANAX) 0.5 MG tablet TAKE 1 TO 2 TABLETS BY MOUTH TWICE DAILY AS NEEDED FOR ANXIETY OR SLEEP   azelastine (ASTELIN) 0.1 % nasal spray Place 2 sprays into both nostrils daily. Use in each nostril as directed   azelastine (OPTIVAR) 0.05 % ophthalmic solution Place 1 drop into both eyes 2 (two) times daily.   Azelastine HCl 0.15 % SOLN SMARTSIG:1 Spray(s) Both Nares Every Evening   B Complex Vitamins (VITAMIN B COMPLEX PO) Take 1 tablet by mouth daily.   Betamethasone Sodium Phosphate 6 MG/ML SOLN INJECT 2:4 SYRINGE BILATERALLY INTO EACH KNEE JOINT   Biotin 5000 MCG CAPS Take 5,000 mcg by mouth daily.    cholecalciferol (VITAMIN D) 1000 UNITS tablet Take 1,000 Units by mouth daily.   co-enzyme Q-10 50 MG capsule Take 400 mg by mouth daily.   colchicine (COLCRYS) 0.6 MG tablet Take 0.6 mg by mouth daily as needed (flare  up).    Cyanocobalamin (VITAMIN B-12 PO) Take 1 tablet by mouth daily.   cycloSPORINE (RESTASIS) 0.05 % ophthalmic emulsion Apply to eye as needed.   diclofenac sodium (  VOLTAREN) 1 % GEL Apply 4 g topically 4 (four) times daily as needed.   EPINEPHrine 0.3 mg/0.3 mL IJ SOAJ injection Inject into the muscle.   EQL Natural Zinc 50 MG TABS Take 50 mg by mouth daily.   escitalopram (LEXAPRO) 10 MG tablet TAKE 1 TABLET(10 MG) BY MOUTH DAILY   Evolocumab with Infusor (REPATHA PUSHTRONEX SYSTEM) 420 MG/3.5ML SOCT Inject 1 Units  into the skin every 30 (thirty) days.   fexofenadine (ALLEGRA) 180 MG tablet TAKE 1 TABLET BY MOUTH EVERY DAY   fluticasone (FLONASE) 50 MCG/ACT nasal spray Place 1 spray into both nostrils every morning.   furosemide (LASIX) 40 MG tablet Take 1 tablet (40 mg total) by mouth daily.   gabapentin (NEURONTIN) 300 MG capsule Take 1 capsule (300 mg total) by mouth 3 (three) times daily. (Patient taking differently: Take 300 mg by mouth daily. )   levalbuterol (XOPENEX HFA) 45 MCG/ACT inhaler Inhale 2 puffs into the lungs every 6 (six) hours as needed for wheezing or shortness of breath.   levocetirizine (XYZAL) 5 MG tablet Take 1 tablet (5 mg total) by mouth every evening.   levothyroxine (SYNTHROID) 75 MCG tablet TAKE 1 TABLET(75 MCG) BY MOUTH DAILY   metoprolol succinate (TOPROL-XL) 25 MG 24 hr tablet TAKE 1/2 TABLET(12.5 MG) BY MOUTH DAILY   montelukast (SINGULAIR) 10 MG tablet Take 1 tablet (10 mg total) by mouth at bedtime.   mupirocin ointment (BACTROBAN) 2 % Use twice a day as directed   nystatin cream (MYCOSTATIN) Apply 1 application topically daily as needed for dry skin.    potassium chloride SA (KLOR-CON) 20 MEQ tablet Take 1 tablet (20 mEq total) by mouth daily.   Pyridoxine HCl (VITAMIN B-6 PO) Take 1 tablet by mouth daily.   Saccharomyces boulardii (FLORASTOR PO) Take 2 capsules by mouth daily.   tiZANidine (ZANAFLEX) 2 MG tablet Take 1 tablet (2 mg total) by mouth at bedtime.   traMADol-acetaminophen (ULTRACET) 37.5-325 MG tablet Take 1-2 tablets by mouth every 6 (six) hours as needed for moderate pain or severe pain.    triamcinolone (NASACORT AQ) 55 MCG/ACT AERO nasal inhaler Place 2 sprays daily into the nose.   triamcinolone cream (KENALOG) 0.1 % Apply 1 application topically 2 (two) times daily. For 2 weeks, then as needed   valACYclovir (VALTREX) 1000 MG tablet TAKE 2 TABLETS BY MOUTH TWICE DAILY FOR 1 DAY-TAKE DOSES 12 HOURS APART   vitamin C (ASCORBIC ACID)  500 MG tablet Take 500 mg by mouth daily.   VITAMIN E PO Take 1 tablet by mouth daily.   WIXELA INHUB 500-50 MCG/DOSE AEPB Inhale 1 puff into the lungs 2 (two) times daily.   XARELTO 20 MG TABS tablet TAKE 1 TABLET(20 MG) BY MOUTH DAILY WITH SUPPER   No facility-administered encounter medications on file as of 02/08/2020.    Allergies (verified) Crestor [rosuvastatin calcium], Hydrocodone-acetaminophen, Lidocaine hcl, Lipitor [atorvastatin calcium], Procaine, Rosuvastatin, Theophylline, Vicodin [hydrocodone-acetaminophen], Codeine, Oxycodone-acetaminophen, Propoxyphene, Quinine derivatives, Sulfa antibiotics, Atorvastatin, Ephedrine, Other, Oxycodone, Cephalexin, Epinephrine, Penicillins, and Theophyllines   History: Past Medical History:  Diagnosis Date   Allergy    Anxiety    Arthritis    no cartlidge in knees   Asthma    Bradycardia    a. atenolol stopped due to this, HR 40s.   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  CAD s/p RCA stent 2000 with residual mild-mod disease  of left system 2001.   Chronic atrial fibrillation (HCC)    COPD (chronic obstructive pulmonary disease) ([place])    Essential hypertension 03/24/2015   Gout    post operative   Hx of radiation therapy 04/02/11 -05/20/11   left breast   Hyperlipidemia    Hypothyroidism 03/24/2015   Incontinence of urine    Malignant neoplasm of upper-outer quadrant of female breast ([place]) 12/26/2010   Osteoporosis 03/24/2015   Peripheral neuropathy 03/24/2015   Plantar fasciitis    Sleep apnea    Past Surgical History:  Procedure Laterality Date   BREAST LUMPECTOMY W/ NEEDLE LOCALIZATION  01/29/2011   Left with [facility] Dr Margot Chimes   CHOLECYSTECTOMY  1955   CORONARY ANGIOPLASTY WITH STENT PLACEMENT  2000   DILATION AND CURETTAGE OF UTERUS  1976   and [place]   tendons between thumb and forefinger   SKIN BIOPSY     1994, 2006, 2008,  2010, 2011, 2011, 2012-  pre-cancerous   TONSILLECTOMY  1955   Family History  Problem Relation Age of Onset   Heart disease Father    Cancer Father        intestinal polyps   Heart attack Father    Asthma Sister    Heart disease Brother    Heart attack Brother    Social History   Socioeconomic History   Marital status: Married    Spouse name: Not on file   Number of children: Not on file   Years of education: Not on file   Highest education level: Not on file  Occupational History   Not on file  Tobacco Use   Smoking status: Former Smoker    Packs/day: 1.00    Years: 4.00    Pack years: 4.00    Quit date: 12/05/1962    Years since quitting: 57.2   Smokeless tobacco: Never Used  Substance and Sexual Activity   Alcohol use: No   Drug use: No   Sexual activity: Not on file  Other Topics Concern   Not on file  Social History Narrative   Not on file   Social Determinants of Health   Financial Resource Strain: Low Risk    Difficulty of Paying Living Expenses: Not hard at all  Food Insecurity: No Food Insecurity   Worried About Charity fundraiser in the Last Year: Never true   Arboriculturist in the Last Year: Never true  Transportation Needs: No Transportation Needs   Lack of Transportation (Medical): No   Lack of Transportation (Non-Medical): No  Physical Activity: Inactive   Days of Exercise per Week: 0 days   Minutes of Exercise per Session: 0 min  Stress: No Stress Concern Present   Feeling of Stress : Not at all  Social Connections: Socially Integrated   Frequency of Communication with Friends and Family: More than three times a week   Frequency of Social Gatherings with Friends and Family: More than three times a week   Attends Religious Services: More than 4 times per year   Active Member of [employer] or Organizations: Yes   Attends Music therapist: More than 4 times per year   Marital Status: Married     Tobacco Counseling Counseling given: Not Answered   Clinical Intake:  Pre-visit preparation completed: Yes  Pain : No/denies pain     Nutritional Risks: None Diabetes: No  How often do you need to have someone help you when  you read instructions, pamphlets, or other written materials from your doctor or pharmacy?: 1 - Never What is the last grade level you completed in school?: [employer] (Master's Courses)  Diabetic? no  Interpreter Needed?: No  Information entered by :: Lisette Abu, LPN   Activities of Daily Living In your present state of health, do you have any difficulty performing the following activities: 02/08/2020 01/21/2020  Hearing? N N  Vision? N N  Difficulty concentrating or making decisions? N N  Walking or climbing stairs? N N  Dressing or bathing? N N  Doing errands, shopping? Y N  Preparing Food and eating ? N -  Using the Toilet? Y -  In the past six months, have you accidently leaked urine? Y -  Do you have problems with loss of bowel control? N -  Managing your Medications? N -  Managing your Finances? N -  Housekeeping or managing your Housekeeping? N -  Some recent data might be hidden    Patient Care Team: Biagio Borg, MD as PCP - General (Internal Medicine) Jettie Booze, MD as PCP - Cardiology (Cardiology) Neldon Mc, MD as Surgeon (General Surgery) Magrinat, Virgie Dad, MD (Hematology and Oncology) Thea Silversmith, MD (Radiation Oncology)  Indicate any recent Medical Services you may have received from other than [facility] providers in the past year (date may be approximate).     Assessment:   This is a routine wellness examination for [place].  Hearing/Vision screen No exam data present  Dietary issues and exercise activities discussed: Current Exercise Habits: The patient does not participate in regular exercise at present, Exercise limited by: orthopedic condition(s);cardiac condition(s);respiratory  conditions(s)  Goals   None    Depression Screen PHQ 2/9 Scores 02/08/2020 01/21/2020 09/01/2019 03/30/2019 09/17/2018 03/19/2018 03/20/2017  PHQ - 2 Score 0 0 0 0 0 0 0  PHQ- 9 Score - 0 0 - - - -    Fall Risk Fall Risk  02/08/2020 09/01/2019 04/27/2019 03/30/2019 09/17/2018  Falls in the past year? 0 0 0 0 0  Number falls in past yr: 0 0 0 - -  Injury with Fall? 0 0 0 - -  Risk for fall due to : Impaired balance/gait History of fall(s) - - -  Follow up Falls evaluation completed Falls evaluation completed - - -    Any stairs in or around the home? Yes  If so, are there any without handrails? No  Home free of loose throw rugs in walkways, pet beds, electrical cords, etc? Yes  Adequate lighting in your home to reduce risk of falls? Yes   ASSISTIVE DEVICES UTILIZED TO PREVENT FALLS:  Life alert? No  Use of a cane, walker or w/c? Yes  Grab bars in the bathroom? Yes  Shower chair or bench in shower? Yes  Elevated toilet seat or a handicapped toilet? Yes   TIMED UP AND GO:  Was the test performed? No .  Length of time to ambulate 10 feet: 0 sec.   Gait slow and steady with assistive device  Cognitive Function: not indicated.        Immunizations Immunization History  Administered Date(s) Administered   Fluad Quad(high Dose 65+) 03/30/2019, 01/21/2020   Influenza, High Dose Seasonal PF 03/20/2017, 01/19/2018   Influenza-Unspecified 05/08/2015, 01/23/2016   PFIZER SARS-COV-2 Vaccination 06/11/2019, 07/06/2019   Pneumococcal Conjugate-13 03/20/2017   Pneumococcal Polysaccharide-23 01/23/2016    TDAP status: Due, Education has been provided regarding the importance of this vaccine. Advised may receive  this vaccine at local pharmacy or Health Dept. Aware to provide a copy of the vaccination record if obtained from local pharmacy or Health Dept. Verbalized acceptance and understanding. Flu Vaccine status: Up to date Pneumococcal vaccine status: Up to date Covid-19  vaccine status: Completed vaccines  Qualifies for Shingles Vaccine? Yes   Zostavax completed Yes   Shingrix Completed?: Yes (need proof from pharmacy)  Screening Tests Health Maintenance  Topic Date Due   TETANUS/TDAP  05/06/2020   INFLUENZA VACCINE  Completed   DEXA SCAN  Completed   COVID-19 Vaccine  Completed   PNA vac Low Risk Adult  Completed    Health Maintenance  There are no preventive care reminders to display for this patient.  Colorectal cancer screening: No longer required.  Mammogram status: Completed 01/28/2019. Repeat every year Bone Density status: Completed 09/04/2014. Results reflect: Bone density results: OSTEOPOROSIS. Repeat every 2 years.  Lung Cancer Screening: (Low Dose CT Chest recommended if Age 46-80 years, 30 pack-year currently smoking OR have quit w/in 15years.) does not qualify.   Lung Cancer Screening Referral: no  Additional Screening:  Hepatitis C Screening: does not qualify; Completed no  Vision Screening: Recommended annual ophthalmology exams for early detection of glaucoma and other disorders of the eye. Is the patient up to date with their annual eye exam?  Yes  Who is the provider or what is the name of the office in which the patient attends annual eye exams? [facility] If pt is not established with a provider, would they like to be referred to a provider to establish care? No .   Dental Screening: Recommended annual dental exams for proper oral hygiene  Community Resource Referral / Chronic Care Management: CRR required this visit?  No   CCM required this visit?  No      Plan:     I have personally reviewed and noted the following in the patients chart:    Medical and social history  Use of alcohol, tobacco or illicit drugs   Current medications and supplements  Functional ability and status  Nutritional status  Physical activity  Advanced directives  List of other physicians  Hospitalizations,  surgeries, and ER visits in previous 12 months  Vitals  Screenings to include cognitive, depression, and falls  Referrals and appointments  In addition, I have reviewed and discussed with patient certain preventive protocols, quality metrics, and best practice recommendations. A written personalized care plan for preventive services as well as general preventive health recommendations were provided to patient.     Sheral Flow, LPN   [id number]/[id number]   Nurse Notes:  Patient is cogitatively intact. There were no vitals filed for this visit. There is no height or weight on file to calculate BMI. Patient stated that she has issues with gait and balance; uses assistive devices.

## 2020-02-08 NOTE — Patient Instructions (Addendum)
[name] , Thank you for taking time to come for your Medicare Wellness Visit. I appreciate your ongoing commitment to your health goals. Please review the following plan we discussed and let me know if I can assist you in the future.   Screening recommendations/referrals: Colonoscopy: never done Mammogram: 01/28/2019 Bone Density: 09/04/2014 Recommended yearly ophthalmology/optometry visit for glaucoma screening and checkup Recommended yearly dental visit for hygiene and checkup  Vaccinations: Influenza vaccine: 01/21/2020 Pneumococcal vaccine: completed Tdap vaccine: never done Shingles vaccine: completed   Covid-19: completed  Advanced directives: Please bring a copy of your health care power of attorney and living will to the office at your convenience.  Conditions/risks identified: Yes; Reviewed health maintenance screenings with patient today and relevant education, vaccines, and/or referrals were provided. Please continue to do your personal lifestyle choices by: daily care of teeth and gums, regular physical activity (goal should be 5 days a week for 30 minutes), eat a healthy diet, avoid tobacco and drug use, limiting any alcohol intake, taking a low-dose aspirin (if not allergic or have been advised by your provider otherwise) and taking vitamins and minerals as recommended by your provider. Continue doing brain stimulating activities (puzzles, reading, adult coloring books, staying active) to keep memory sharp. Continue to eat heart healthy diet (full of fruits, vegetables, whole grains, lean protein, water--limit salt, fat, and sugar intake) and increase physical activity as tolerated.  Next appointment: Please schedule your next Medicare Wellness Visit with your Nurse Health Advisor in 1 year by calling [phone].  Preventive Care 82 Years and Older, Female Preventive care refers to lifestyle choices and visits with your health care provider that can promote health and  wellness. What does preventive care include?  A yearly physical exam. This is also called an annual well check.  Dental exams once or twice a year.  Routine eye exams. Ask your health care provider how often you should have your eyes checked.  Personal lifestyle choices, including:  Daily care of your teeth and gums.  Regular physical activity.  Eating a healthy diet.  Avoiding tobacco and drug use.  Limiting alcohol use.  Practicing safe sex.  Taking low-dose aspirin every day.  Taking vitamin and mineral supplements as recommended by your health care provider. What happens during an annual well check? The services and screenings done by your health care provider during your annual well check will depend on your age, overall health, lifestyle risk factors, and family history of disease. Counseling  Your health care provider may ask you questions about your:  Alcohol use.  Tobacco use.  Drug use.  Emotional well-being.  Home and relationship well-being.  Sexual activity.  Eating habits.  History of falls.  Memory and ability to understand (cognition).  Work and work Statistician.  Reproductive health. Screening  You may have the following tests or measurements:  Height, weight, and BMI.  Blood pressure.  Lipid and cholesterol levels. These may be checked every 5 years, or more frequently if you are over 82 years old.  Skin check.  Lung cancer screening. You may have this screening every year starting at age 50 if you have a 30-pack-year history of smoking and currently smoke or have quit within the past 15 years.  Fecal occult blood test (FOBT) of the stool. You may have this test every year starting at age 5.  Flexible sigmoidoscopy or colonoscopy. You may have a sigmoidoscopy every 5 years or a colonoscopy every 10 years starting at age 76.  Hepatitis C blood test.  Hepatitis B blood test.  Sexually transmitted disease (STD)  testing.  Diabetes screening. This is done by checking your blood sugar (glucose) after you have not eaten for a while (fasting). You may have this done every 1-3 years.  Bone density scan. This is done to screen for osteoporosis. You may have this done starting at age 50.  Mammogram. This may be done every 1-2 years. Talk to your health care provider about how often you should have regular mammograms. Talk with your health care provider about your test results, treatment options, and if necessary, the need for more tests. Vaccines  Your health care provider may recommend certain vaccines, such as:  Influenza vaccine. This is recommended every year.  Tetanus, diphtheria, and acellular pertussis (Tdap, Td) vaccine. You may need a Td booster every 10 years.  Zoster vaccine. You may need this after age 53.  Pneumococcal 13-valent conjugate (PCV13) vaccine. One dose is recommended after age 67.  Pneumococcal polysaccharide (PPSV23) vaccine. One dose is recommended after age 36. Talk to your health care provider about which screenings and vaccines you need and how often you need them. This information is not intended to replace advice given to you by your health care provider. Make sure you discuss any questions you have with your health care provider. Document Released: 05/19/2015 Document Revised: 01/10/2016 Document Reviewed: 02/21/2015 Elsevier Interactive Patient Education  2017 [employer] Prevention in the Home Falls can cause injuries. They can happen to people of all ages. There are many things you can do to make your home safe and to help prevent falls. What can I do on the outside of my home?  Regularly fix the edges of walkways and driveways and fix any cracks.  Remove anything that might make you trip as you walk through a door, such as a raised step or threshold.  Trim any bushes or trees on the path to your home.  Use bright outdoor lighting.  Clear any walking  paths of anything that might make someone trip, such as rocks or tools.  Regularly check to see if handrails are loose or broken. Make sure that both sides of any steps have handrails.  Any raised decks and porches should have guardrails on the edges.  Have any leaves, snow, or ice cleared regularly.  Use sand or salt on walking paths during winter.  Clean up any spills in your garage right away. This includes oil or grease spills. What can I do in the bathroom?  Use night lights.  Install grab bars by the toilet and in the tub and shower. Do not use towel bars as grab bars.  Use non-skid mats or decals in the tub or shower.  If you need to sit down in the shower, use a plastic, non-slip stool.  Keep the floor dry. Clean up any water that spills on the floor as soon as it happens.  Remove soap buildup in the tub or shower regularly.  Attach bath mats securely with double-sided non-slip rug tape.  Do not have throw rugs and other things on the floor that can make you trip. What can I do in the bedroom?  Use night lights.  Make sure that you have a light by your bed that is easy to reach.  Do not use any sheets or blankets that are too big for your bed. They should not hang down onto the floor.  Have a firm chair that has side  arms. You can use this for support while you get dressed.  Do not have throw rugs and other things on the floor that can make you trip. What can I do in the kitchen?  Clean up any spills right away.  Avoid walking on wet floors.  Keep items that you use a lot in easy-to-reach places.  If you need to reach something above you, use a strong step stool that has a grab bar.  Keep electrical cords out of the way.  Do not use floor polish or wax that makes floors slippery. If you must use wax, use non-skid floor wax.  Do not have throw rugs and other things on the floor that can make you trip. What can I do with my stairs?  Do not leave any items  on the stairs.  Make sure that there are handrails on both sides of the stairs and use them. Fix handrails that are broken or loose. Make sure that handrails are as long as the stairways.  Check any carpeting to make sure that it is firmly attached to the stairs. Fix any carpet that is loose or worn.  Avoid having throw rugs at the top or bottom of the stairs. If you do have throw rugs, attach them to the floor with carpet tape.  Make sure that you have a light switch at the top of the stairs and the bottom of the stairs. If you do not have them, ask someone to add them for you. What else can I do to help prevent falls?  Wear shoes that:  Do not have high heels.  Have rubber bottoms.  Are comfortable and fit you well.  Are closed at the toe. Do not wear sandals.  If you use a stepladder:  Make sure that it is fully opened. Do not climb a closed stepladder.  Make sure that both sides of the stepladder are locked into place.  Ask someone to hold it for you, if possible.  Clearly mark and make sure that you can see:  Any grab bars or handrails.  First and last steps.  Where the edge of each step is.  Use tools that help you move around (mobility aids) if they are needed. These include:  Canes.  Walkers.  Scooters.  Crutches.  Turn on the lights when you go into a dark area. Replace any light bulbs as soon as they burn out.  Set up your furniture so you have a clear path. Avoid moving your furniture around.  If any of your floors are uneven, fix them.  If there are any pets around you, be aware of where they are.  Review your medicines with your doctor. Some medicines can make you feel dizzy. This can increase your chance of falling. Ask your doctor what other things that you can do to help prevent falls. This information is not intended to replace advice given to you by your health care provider. Make sure you discuss any questions you have with your health care  provider. Document Released: 02/16/2009 Document Revised: 09/28/2015 Document Reviewed: 05/27/2014 Elsevier Interactive Patient Education  2017 [employer].

## 2020-02-15 DIAGNOSIS — M170 Bilateral primary osteoarthritis of knee: Secondary | ICD-10-CM | POA: Diagnosis not present

## 2020-02-15 DIAGNOSIS — M2550 Pain in unspecified joint: Secondary | ICD-10-CM | POA: Diagnosis not present

## 2020-02-15 DIAGNOSIS — M1009 Idiopathic gout, multiple sites: Secondary | ICD-10-CM | POA: Diagnosis not present

## 2020-02-15 DIAGNOSIS — Z79899 Other long term (current) drug therapy: Secondary | ICD-10-CM | POA: Diagnosis not present

## 2020-02-17 ENCOUNTER — Ambulatory Visit: Payer: Medicare Other | Admitting: Emergency Medicine

## 2020-02-21 DIAGNOSIS — Z1231 Encounter for screening mammogram for malignant neoplasm of breast: Secondary | ICD-10-CM | POA: Diagnosis not present

## 2020-03-07 ENCOUNTER — Encounter: Payer: Self-pay | Admitting: Internal Medicine

## 2020-03-31 ENCOUNTER — Other Ambulatory Visit: Payer: Self-pay | Admitting: Internal Medicine

## 2020-04-03 DIAGNOSIS — L570 Actinic keratosis: Secondary | ICD-10-CM | POA: Diagnosis not present

## 2020-04-03 DIAGNOSIS — L821 Other seborrheic keratosis: Secondary | ICD-10-CM | POA: Diagnosis not present

## 2020-04-03 DIAGNOSIS — L718 Other rosacea: Secondary | ICD-10-CM | POA: Diagnosis not present

## 2020-04-03 DIAGNOSIS — L814 Other melanin hyperpigmentation: Secondary | ICD-10-CM | POA: Diagnosis not present

## 2020-04-04 ENCOUNTER — Encounter: Payer: Self-pay | Admitting: Internal Medicine

## 2020-04-04 DIAGNOSIS — E782 Mixed hyperlipidemia: Secondary | ICD-10-CM

## 2020-04-04 DIAGNOSIS — R739 Hyperglycemia, unspecified: Secondary | ICD-10-CM

## 2020-04-06 DIAGNOSIS — H3091 Unspecified chorioretinal inflammation, right eye: Secondary | ICD-10-CM | POA: Insufficient documentation

## 2020-04-06 DIAGNOSIS — H43813 Vitreous degeneration, bilateral: Secondary | ICD-10-CM | POA: Diagnosis not present

## 2020-04-06 DIAGNOSIS — H35373 Puckering of macula, bilateral: Secondary | ICD-10-CM | POA: Diagnosis not present

## 2020-04-06 DIAGNOSIS — D3131 Benign neoplasm of right choroid: Secondary | ICD-10-CM | POA: Diagnosis not present

## 2020-04-17 ENCOUNTER — Other Ambulatory Visit (INDEPENDENT_AMBULATORY_CARE_PROVIDER_SITE_OTHER): Payer: Medicare Other

## 2020-04-17 ENCOUNTER — Encounter: Payer: Self-pay | Admitting: Internal Medicine

## 2020-04-17 DIAGNOSIS — E782 Mixed hyperlipidemia: Secondary | ICD-10-CM

## 2020-04-17 DIAGNOSIS — M1711 Unilateral primary osteoarthritis, right knee: Secondary | ICD-10-CM | POA: Diagnosis not present

## 2020-04-17 DIAGNOSIS — M1712 Unilateral primary osteoarthritis, left knee: Secondary | ICD-10-CM | POA: Diagnosis not present

## 2020-04-17 DIAGNOSIS — R739 Hyperglycemia, unspecified: Secondary | ICD-10-CM | POA: Diagnosis not present

## 2020-04-17 LAB — TSH: TSH: 3.99 u[IU]/mL (ref 0.35–4.50)

## 2020-04-17 LAB — CBC WITH DIFFERENTIAL/PLATELET
Basophils Absolute: 0 10*3/uL (ref 0.0–0.1)
Basophils Relative: 0.6 % (ref 0.0–3.0)
Eosinophils Absolute: 0.2 10*3/uL (ref 0.0–0.7)
Eosinophils Relative: 3 % (ref 0.0–5.0)
HCT: 39.4 % (ref 36.0–46.0)
Hemoglobin: 12.9 g/dL (ref 12.0–15.0)
Lymphocytes Relative: 26.4 % (ref 12.0–46.0)
Lymphs Abs: 1.7 10*3/uL (ref 0.7–4.0)
MCHC: 32.8 g/dL (ref 30.0–36.0)
MCV: 97.9 fl (ref 78.0–100.0)
Monocytes Absolute: 0.6 10*3/uL (ref 0.1–1.0)
Monocytes Relative: 9.6 % (ref 3.0–12.0)
Neutro Abs: 3.8 10*3/uL (ref 1.4–7.7)
Neutrophils Relative %: 60.4 % (ref 43.0–77.0)
Platelets: 143 10*3/uL — ABNORMAL LOW (ref 150.0–400.0)
RBC: 4.03 Mil/uL (ref 3.87–5.11)
RDW: 14.9 % (ref 11.5–15.5)
WBC: 6.3 10*3/uL (ref 4.0–10.5)

## 2020-04-17 LAB — LIPID PANEL
Cholesterol: 120 mg/dL (ref 0–200)
HDL: 55.7 mg/dL (ref >39.00)
LDL Cholesterol: 47 mg/dL (ref 0–99)
NonHDL: 64.69
Total CHOL/HDL Ratio: 2
Triglycerides: 89 mg/dL (ref 0.0–149.0)
VLDL: 17.8 mg/dL (ref 0.0–40.0)

## 2020-04-17 LAB — HEPATIC FUNCTION PANEL
ALT: 11 U/L (ref 0–35)
AST: 16 U/L (ref 0–37)
Albumin: 4.1 g/dL (ref 3.5–5.2)
Alkaline Phosphatase: 64 U/L (ref 39–117)
Bilirubin, Direct: 0.3 mg/dL (ref 0.0–0.3)
Total Bilirubin: 1.2 mg/dL (ref 0.2–1.2)
Total Protein: 6.8 g/dL (ref 6.0–8.3)

## 2020-04-17 LAB — BASIC METABOLIC PANEL
BUN: 15 mg/dL (ref 6–23)
CO2: 26 mEq/L (ref 19–32)
Calcium: 9.6 mg/dL (ref 8.4–10.5)
Chloride: 104 mEq/L (ref 96–112)
Creatinine, Ser: 0.74 mg/dL (ref 0.40–1.20)
GFR: 75.23 mL/min (ref >60.00)
Glucose, Bld: 96 mg/dL (ref 70–99)
Potassium: 3.4 mEq/L — ABNORMAL LOW (ref 3.5–5.1)
Sodium: 143 mEq/L (ref 135–145)

## 2020-04-17 LAB — HEMOGLOBIN A1C: Hgb A1c MFr Bld: 5.8 % (ref 4.6–6.5)

## 2020-04-21 ENCOUNTER — Encounter: Payer: Self-pay | Admitting: Internal Medicine

## 2020-04-21 ENCOUNTER — Other Ambulatory Visit: Payer: Self-pay

## 2020-04-21 ENCOUNTER — Ambulatory Visit (INDEPENDENT_AMBULATORY_CARE_PROVIDER_SITE_OTHER): Payer: Medicare Other | Admitting: Internal Medicine

## 2020-04-21 ENCOUNTER — Other Ambulatory Visit: Payer: Self-pay | Admitting: Internal Medicine

## 2020-04-21 VITALS — BP 118/80 | HR 68 | Temp 98.7°F | Ht 66.0 in

## 2020-04-21 DIAGNOSIS — Z0000 Encounter for general adult medical examination without abnormal findings: Secondary | ICD-10-CM | POA: Diagnosis not present

## 2020-04-21 DIAGNOSIS — J45909 Unspecified asthma, uncomplicated: Secondary | ICD-10-CM | POA: Diagnosis not present

## 2020-04-21 DIAGNOSIS — I509 Heart failure, unspecified: Secondary | ICD-10-CM

## 2020-04-21 DIAGNOSIS — R110 Nausea: Secondary | ICD-10-CM

## 2020-04-21 DIAGNOSIS — I10 Essential (primary) hypertension: Secondary | ICD-10-CM | POA: Diagnosis not present

## 2020-04-21 DIAGNOSIS — Z0001 Encounter for general adult medical examination with abnormal findings: Secondary | ICD-10-CM

## 2020-04-21 DIAGNOSIS — K5900 Constipation, unspecified: Secondary | ICD-10-CM

## 2020-04-21 DIAGNOSIS — E039 Hypothyroidism, unspecified: Secondary | ICD-10-CM

## 2020-04-21 DIAGNOSIS — E782 Mixed hyperlipidemia: Secondary | ICD-10-CM

## 2020-04-21 MED ORDER — ONDANSETRON HCL 4 MG PO TABS: 4.0000 mg | ORAL_TABLET | Freq: Three times a day (TID) | ORAL | 1 refills | Status: DC | PRN

## 2020-04-21 NOTE — Progress Notes (Signed)
Subjective:    Patient ID: [name], female    DOB: 1935-08-17, 84 y.o.   MRN: [id number]  HPI  Here for wellness and f/u;  Overall doing ok;  Pt denies Chest pain, worsening SOB, DOE, wheezing, orthopnea, PND, worsening LE edema, palpitations, dizziness or syncope.  Pt denies neurological change such as new headache, facial or extremity weakness.  Pt denies polydipsia, polyuria, or low sugar symptoms. Pt states overall good compliance with treatment and medications, good tolerability, and has been trying to follow appropriate diet.  Pt denies worsening depressive symptoms, suicidal ideation or panic. No fever, night sweats, wt loss, loss of appetite, or other constitutional symptoms.  Pt states good ability with ADL's, has low fall risk, home safety reviewed and adequate, no other significant changes in hearing or vision, and only occasionally active with exercise. Also, had episode severe constipation in oct 2021 - tx herself with prune juice and psyllium type otc, but still struggling.  Lost 12 lbs with better diet by her account? Wt Readings from Last 3 Encounters:  01/13/20 (!) 309 lb 3.2 oz (140.3 kg)  12/28/19 (!) 312 lb 9.6 oz (141.8 kg)  04/27/19 295 lb (133.8 kg)  Father had colon ca, worried about this. Has not been taking her vitamins such as D, has been more fatigued she thinks related to this, but plans to restart.  Never had colonoscopy, but did have at some point had what sounds like flex sig   Did not follow through with cologuard a few yrs ago.   Past Medical History:  Diagnosis Date  . Allergy   . Anxiety   . Arthritis    no cartlidge in knees  . Asthma   . Bradycardia    a. atenolol stopped due to this, HR 40s.  . 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 CAD in native artery    a.  CAD s/p RCA stent 2000 with residual mild-mod disease of left system 2001.  Marland Kitchen Chronic atrial fibrillation ([place])   . COPD (chronic obstructive pulmonary disease) ([place])   .  Essential hypertension 03/24/2015  . Gout    post operative  . Hx of radiation therapy 04/02/11 -05/20/11   left breast  . Hyperlipidemia   . Hypothyroidism 03/24/2015  . Incontinence of urine   . Malignant neoplasm of upper-outer quadrant of female breast ([place]) 12/26/2010  . Osteoporosis 03/24/2015  . Peripheral neuropathy 03/24/2015  . Plantar fasciitis   . Sleep apnea    Past Surgical History:  Procedure Laterality Date  . BREAST LUMPECTOMY W/ NEEDLE LOCALIZATION  01/29/2011   Left with [facility] Dr Margot Chimes  . CHOLECYSTECTOMY  1955  . CORONARY ANGIOPLASTY WITH STENT PLACEMENT  2000  . DILATION AND CURETTAGE OF UTERUS  1976   and 1996  . HAND SURGERY  1992   tendons between thumb and forefinger  . SKIN BIOPSY     1994, 2006, 2008, 2010, 2011, 2011, 2012-  pre-cancerous  . TONSILLECTOMY  1955    reports that she quit smoking about 57 years ago. She has a 4.00 pack-year smoking history. She has never used smokeless tobacco. She reports that she does not drink alcohol and does not use drugs. family history includes Asthma in her sister; Cancer in her father; Heart attack in her brother and father; Heart disease in her brother and father. Allergies  Allergen Reactions  . Crestor [Rosuvastatin Calcium]     Severe liver function problems  .  Hydrocodone-Acetaminophen Anxiety and Itching  . Lidocaine Hcl Other (See Comments)    Novacaine:  Becomes very shaky  . Lipitor [Atorvastatin Calcium] Other (See Comments)    Elevated liver function   . Procaine Other (See Comments)    shaking  . Rosuvastatin Other (See Comments)    Severe liver function problems  . Theophylline Other (See Comments)    Becomes very shaky skaking skaking  . Vicodin [Hydrocodone-Acetaminophen] Itching    Itching all over  . Codeine Nausea Only, Anxiety and Other (See Comments)    Also complained of dizziness.  Has same problems with oxycodone and hydrocodone Visual Disturbance, Balance Difficulty, Dizzy "Room  Spinning; "Swimming" Balance, vision, nausea, dizzy, "swimming", "room spinning" Balance, vision, nausea, dizzy, "swimming", "room spinning"  . Oxycodone-Acetaminophen Nausea Only, Anxiety and Other (See Comments)    Visual Disturbance, Balance Difficulty, Dizzy "Room Spinning; "Swimming"  . Propoxyphene Anxiety, Nausea Only and Other (See Comments)    Visual Disturbance, Balance Difficulty, Dizzy "Room Spinning; "Swimming" Balance, vision, nausea, dizzy, "swimming", "room spinning" Balance, vision, nausea, dizzy, "swimming, "room spinning"  . Quinine Derivatives Nausea Only    dizzy  . Sulfa Antibiotics Nausea Only    Also complained of dizziness  . Atorvastatin Other (See Comments)    Severe liver function problems  . Ephedrine Other (See Comments)    shaking  . Other Other (See Comments)    Quinine causes nausea, dizzy  . Oxycodone Other (See Comments)    Balance, vision, nausea, dizzy, "swimming", "room spinning"  . Cephalexin Rash  . Epinephrine Other (See Comments)    Patient becomes "shaky" shaking shaking  . Penicillins Rash    Pt reported all over body rash in 1958 Has patient had a PCN reaction causing immediate rash, facial/tongue/throat swelling, SOB or lightheadedness with hypotension:Yes Has patient had a PCN reaction causing severe rash involving mucus membranes or skin necrosis: No Has patient had a PCN reaction that required hospitalization: No Has patient had a PCN reaction occurring within the last 10 years:No    . Theophyllines Other (See Comments)    Patient becomes "shaky"   Current Outpatient Medications on File Prior to Visit  Medication Sig Dispense Refill  . albuterol (PROVENTIL) (2.5 MG/3ML) 0.083% nebulizer solution Take 3 mLs (2.5 mg total) by nebulization every 6 (six) hours as needed for wheezing or shortness of breath. 75 mL 12  . albuterol (VENTOLIN HFA) 108 (90 Base) MCG/ACT inhaler INHALE 2 PUFFS BY MOUTH INTO THE LUNGS IF NEEDED FOR WHEEZING  AND OR SHORTNESS OF BREATH 8.5 g 2  . allopurinol (ZYLOPRIM) 300 MG tablet Take 300 mg by mouth daily.  1  . azelastine (ASTELIN) 0.1 % nasal spray Place 2 sprays into both nostrils daily. Use in each nostril as directed 30 mL 5  . azelastine (OPTIVAR) 0.05 % ophthalmic solution Place 1 drop into both eyes 2 (two) times daily. 6 mL 12  . Azelastine HCl 0.15 % SOLN SMARTSIG:1 Spray(s) Both Nares Every Evening    . B Complex Vitamins (VITAMIN B COMPLEX PO) Take 1 tablet by mouth daily.    . Betamethasone Sodium Phosphate 6 MG/ML SOLN INJECT 2:4 SYRINGE BILATERALLY INTO EACH KNEE JOINT    . Biotin 5000 MCG CAPS Take 5,000 mcg by mouth daily.     . cholecalciferol (VITAMIN D) 1000 UNITS tablet Take 1,000 Units by mouth daily.    Marland Kitchen co-enzyme Q-10 50 MG capsule Take 400 mg by mouth daily.    . colchicine 0.6  MG tablet Take 0.6 mg by mouth daily as needed (flare  up).     . Cyanocobalamin (VITAMIN B-12 PO) Take 1 tablet by mouth daily.    . cycloSPORINE (RESTASIS) 0.05 % ophthalmic emulsion Apply to eye as needed.    . diclofenac sodium (VOLTAREN) 1 % GEL Apply 4 g topically 4 (four) times daily as needed. 200 g 11  . EPINEPHrine 0.3 mg/0.3 mL IJ SOAJ injection Inject into the muscle.    Marland Kitchen EQL Natural Zinc 50 MG TABS Take 50 mg by mouth daily.    Marland Kitchen escitalopram (LEXAPRO) 10 MG tablet TAKE 1 TABLET(10 MG) BY MOUTH DAILY 90 tablet 3  . Evolocumab with Infusor ([place]) 420 MG/3.5ML SOCT Inject 1 Units into the skin every 30 (thirty) days. 3.5 mL 11  . fexofenadine (ALLEGRA) 180 MG tablet TAKE 1 TABLET BY MOUTH EVERY DAY 90 tablet 1  . fluticasone (FLONASE) 50 MCG/ACT nasal spray Place 1 spray into both nostrils every morning.    . furosemide (LASIX) 40 MG tablet Take 1 tablet (40 mg total) by mouth daily. 90 tablet 3  . gabapentin (NEURONTIN) 300 MG capsule Take 1 capsule (300 mg total) by mouth 3 (three) times daily. (Patient taking differently: Take 300 mg by mouth daily.) 270 capsule  1  . hydrochlorothiazide (HYDRODIURIL) 25 MG tablet Take 25 mg by mouth 2 (two) times daily.    Marland Kitchen levalbuterol (XOPENEX HFA) 45 MCG/ACT inhaler Inhale 2 puffs into the lungs every 6 (six) hours as needed for wheezing or shortness of breath. 3 Inhaler 3  . levocetirizine (XYZAL) 5 MG tablet Take 1 tablet (5 mg total) by mouth every evening. 30 tablet 11  . levothyroxine (SYNTHROID) 75 MCG tablet TAKE 1 TABLET(75 MCG) BY MOUTH DAILY 90 tablet 1  . metoprolol succinate (TOPROL-XL) 25 MG 24 hr tablet TAKE 1/2 TABLET(12.5 MG) BY MOUTH DAILY 45 tablet 3  . montelukast (SINGULAIR) 10 MG tablet Take 1 tablet (10 mg total) by mouth at bedtime. 90 tablet 1  . mupirocin ointment (BACTROBAN) 2 % Use twice a day as directed 30 g 1  . nystatin cream (MYCOSTATIN) Apply 1 application topically daily as needed for dry skin.   0  . potassium chloride SA (KLOR-CON) 20 MEQ tablet Take 1 tablet (20 mEq total) by mouth daily. 90 tablet 3  . Pyridoxine HCl (VITAMIN B-6 PO) Take 1 tablet by mouth daily.    . Saccharomyces boulardii (FLORASTOR PO) Take 2 capsules by mouth daily.    Marland Kitchen tiZANidine (ZANAFLEX) 2 MG tablet Take 1 tablet (2 mg total) by mouth at bedtime. 90 tablet 1  . traMADol-acetaminophen (ULTRACET) 37.5-325 MG tablet Take 1-2 tablets by mouth every 6 (six) hours as needed for moderate pain or severe pain.   0  . triamcinolone (NASACORT AQ) 55 MCG/ACT AERO nasal inhaler Place 2 sprays daily into the nose. 1 Inhaler 12  . triamcinolone cream (KENALOG) 0.1 % Apply 1 application topically 2 (two) times daily. For 2 weeks, then as needed 80 g 2  . valACYclovir (VALTREX) 1000 MG tablet TAKE 2 TABLETS BY MOUTH TWICE DAILY FOR 1 DAY-TAKE DOSES 12 HOURS APART    . vitamin C (ASCORBIC ACID) 500 MG tablet Take 500 mg by mouth daily.    Marland Kitchen VITAMIN E PO Take 1 tablet by mouth daily.    Grant Ruts INHUB 500-50 MCG/DOSE AEPB Inhale 1 puff into the lungs 2 (two) times daily. 60 each 11  . XARELTO 20  MG TABS tablet TAKE 1  TABLET(20 MG) BY MOUTH DAILY WITH SUPPER 90 tablet 2   No current facility-administered medications on file prior to visit.   Review of Systems All otherwise neg per pt    Objective:   Physical Exam BP 118/80 (BP Location: Left Arm, Patient Position: Sitting, Cuff Size: Large)   Pulse 68   Temp 98.7 F (37.1 C) (Oral)   Ht $R'5\' 6"'Ht$  (1.676 m)   SpO2 95%   BMI 49.91 kg/m  VS noted,  Constitutional: Pt appears in NAD HENT: Head: NCAT.  Right Ear: External ear normal.  Left Ear: External ear normal.  Eyes: . Pupils are equal, round, and reactive to light. Conjunctivae and EOM are normal Nose: without d/c or deformity Neck: Neck supple. Gross normal ROM Cardiovascular: Normal rate and regular rhythm.   Pulmonary/Chest: Effort normal and breath sounds without rales or wheezing.  Abd:  Soft, NT, ND, + BS, no organomegaly Neurological: Pt is alert. At baseline orientation, motor grossly intact Skin: Skin is warm. No rashes, other new lesions, no LE edema Psychiatric: Pt behavior is normal without agitation  All otherwise neg per pt Lab Results  Component Value Date   WBC 6.3 04/17/2020   HGB 12.9 04/17/2020   HCT 39.4 04/17/2020   PLT 143.0 (L) 04/17/2020   GLUCOSE 96 04/17/2020   CHOL 120 04/17/2020   TRIG 89.0 04/17/2020   HDL 55.70 04/17/2020   LDLDIRECT 107.0 09/17/2016   LDLCALC 47 04/17/2020   ALT 11 04/17/2020   AST 16 04/17/2020   NA 143 04/17/2020   K 3.4 (L) 04/17/2020   CL 104 04/17/2020   CREATININE 0.74 04/17/2020   BUN 15 04/17/2020   CO2 26 04/17/2020   TSH 3.99 04/17/2020   HGBA1C 5.8 04/17/2020      Assessment & Plan:

## 2020-04-21 NOTE — Patient Instructions (Signed)
Please take all new medication as prescribed - the nausea medication as needed  Please continue all other medications as before, and refills have been done if requested.  Please have the pharmacy call with any other refills you may need.  Please continue your efforts at being more active, low cholesterol diet, and weight control.  Please keep your appointments with your specialists as you may have planned  Please make an Appointment to return in 6 months, or sooner if needed, also with Lab Appointment for testing done 3-5 days before at the [place] (so this is for TWO appointments - please see the scheduling desk as you leave)  Due to the ongoing Covid 19 pandemic, our lab now requires an appointment for any labs done at our office.  If you need labs done and do not have an appointment, please call our office ahead of time to schedule before presenting to the lab for your testing.\

## 2020-04-23 ENCOUNTER — Encounter: Payer: Self-pay | Admitting: Internal Medicine

## 2020-04-23 DIAGNOSIS — K5900 Constipation, unspecified: Secondary | ICD-10-CM | POA: Insufficient documentation

## 2020-04-23 DIAGNOSIS — R110 Nausea: Secondary | ICD-10-CM | POA: Insufficient documentation

## 2020-04-23 NOTE — Assessment & Plan Note (Signed)

## 2020-04-23 NOTE — Assessment & Plan Note (Signed)
stable overall by history and exam, recent data reviewed with pt, and pt to continue medical treatment as before,  to f/u any worsening symptoms or concerns  

## 2020-04-23 NOTE — Assessment & Plan Note (Addendum)
Ok for zofran prn,  to f/u any worsening symptoms or concerns  I spent 31 minutes in addition to time for CPX wellness examination in preparing to see the patient by review of recent labs, imaging and procedures, obtaining and reviewing separately obtained history, communicating with the patient and family or caregiver, ordering medications, tests or procedures, and documenting clinical information in the EHR including the differential Dx, treatment, and any further evaluation and other management of nausea, constipation, htn, asthma, chf , hypothyroidism

## 2020-04-23 NOTE — Assessment & Plan Note (Signed)
Intermittently uncontrolled several times this past yr, now improved, stable overall by history and exam, recent data reviewed with pt, and pt to continue medical treatment as before,  to f/u any worsening symptoms or concerns'

## 2020-04-23 NOTE — Assessment & Plan Note (Signed)
Also for miralax and colace bid prn,  to f/u any worsening symptoms or concerns

## 2020-05-02 DIAGNOSIS — G4733 Obstructive sleep apnea (adult) (pediatric): Secondary | ICD-10-CM | POA: Diagnosis not present

## 2020-05-16 DIAGNOSIS — L718 Other rosacea: Secondary | ICD-10-CM | POA: Diagnosis not present

## 2020-05-30 ENCOUNTER — Other Ambulatory Visit: Payer: Self-pay | Admitting: Internal Medicine

## 2020-05-30 NOTE — Telephone Encounter (Signed)
Please refill as per office routine med refill policy (all routine meds refilled for 3 mo or monthly per pt preference up to one year from last visit, then month to month grace period for 3 mo, then further med refills will have to be denied)  

## 2020-06-02 ENCOUNTER — Other Ambulatory Visit: Payer: Self-pay | Admitting: Interventional Cardiology

## 2020-06-02 ENCOUNTER — Telehealth: Payer: Self-pay | Admitting: Interventional Cardiology

## 2020-06-02 NOTE — Telephone Encounter (Signed)
*  STAT* If patient is at the pharmacy, call can be transferred to refill team.   1. Which medications need to be refilled? (please list name of each medication and dose if known) Repathha  2. Which pharmacy/location (including street and city if local pharmacy) is medication to be sent to? [facility]  3. Do they need a 30 day or 90 day supply?

## 2020-06-12 DIAGNOSIS — M170 Bilateral primary osteoarthritis of knee: Secondary | ICD-10-CM | POA: Diagnosis not present

## 2020-06-25 NOTE — Progress Notes (Signed)
Cardiology Office Note   Date:  06/27/2020   ID:  [name], [name] 12/25/1935, MRN [id number]  PCP:  Biagio Borg, MD    No chief complaint on file.  AFib  Wt Readings from Last 3 Encounters:  06/27/20 294 lb 9.6 oz (133.6 kg)  01/13/20 (!) 309 lb 3.2 oz (140.3 kg)  12/28/19 (!) 312 lb 9.6 oz (141.8 kg)       History of Present Illness: [name] is a 85 y.o. female  with history ofchronic atrial fib, morbid obesity, CAD s/p RCA stent 2000 with residual mild-mod disease of left system 2001, hyperlipidemia, asthma, breast CA, anxiety, arthritis, HTN, gout, OSA, hypothyroidism, lower extremity edema, bradycardia who presents for yearly follow-up.  Last cath 2001 showed 50% mD1, 20-25% LCx, 20-25% ISR of RCA stent. Last nuc 01/2011 showed small partially reversible defect in anterolateral area, could represent ischemia but could be due to breast attenuation, EF 67%. She was noted to be bradycardic in the 40s in 03/2017 so atenolol was stopped. She noticed after stopping atenolol that she would feel her heart pound faster when she did activity so low dose metoprolol 12.5 mg BID was added back.   She had an abscess in the left shin. She has been told she had a vein problem. She gets a cellulitis at times in the left shin and uses antibiotics. Rare leg swelling.   She was feeling well in 2020 and careful during the pandemic. Zetia stopped in 2020.  Repatha continued.   In the past, she has had some anxiety issues that resolve with Xanax.  She has continued issues with asthma.   n July 2021, she had vascular congestion noted on CXR.  Echo was done and showed: "Left ventricular ejection fraction, by estimation, is 60 to 65%. The  left ventricle has normal function. The left ventricle has no regional  wall motion abnormalities. Left ventricular diastolic parameters are  indeterminate. Elevated left ventricular  end-diastolic pressure.  2. Right ventricular systolic function  is normal. The right ventricular  size is normal. There is moderately elevated pulmonary artery systolic  pressure.  3. Left atrial size was severely dilated.  4. Right atrial size was mildly dilated.  5. The mitral valve is normal in structure. Trivial mitral valve  regurgitation. No evidence of mitral stenosis.  6. The aortic valve is tricuspid. Aortic valve regurgitation is not  visualized. No aortic stenosis is present.  7. The inferior vena cava is normal in size with greater than 50%  respiratory variability, suggesting right atrial pressure of 3 mmHg."   Furosemide was given instead of HCTZ.   Since the last visit, she has ben ok.  SHe did have a fall a few weeks ago.  Ortho would like to do a steroid shot in her left knee, but off of the Xarelto.   Denies : Chest pain. Dizziness.  Nitroglycerin use. Orthopnea. Palpitations. Paroxysmal nocturnal dyspnea.  Syncope.   Using wheelchair when out of the house    Past Medical History:  Diagnosis Date  . Allergy   . Anxiety   . Arthritis    no cartlidge in knees  . Asthma   . Bradycardia    a. atenolol stopped due to this, HR 40s.  . 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 CAD in native artery    a.  CAD s/p RCA stent 2000 with residual mild-mod disease of left system 2001.  Marland Kitchen  Chronic atrial fibrillation ([place])   . COPD (chronic obstructive pulmonary disease) ([place])   . Essential hypertension 03/24/2015  . Gout    post operative  . Hx of radiation therapy 04/02/11 -05/20/11   left breast  . Hyperlipidemia   . Hypothyroidism 03/24/2015  . Incontinence of urine   . Malignant neoplasm of upper-outer quadrant of female breast ([place]) 12/26/2010  . Osteoporosis 03/24/2015  . Peripheral neuropathy 03/24/2015  . Plantar fasciitis   . Sleep apnea     Past Surgical History:  Procedure Laterality Date  . BREAST LUMPECTOMY W/ NEEDLE LOCALIZATION  01/29/2011   Left with [facility] Dr Margot Chimes  . CHOLECYSTECTOMY   1955  . CORONARY ANGIOPLASTY WITH STENT PLACEMENT  2000  . DILATION AND CURETTAGE OF UTERUS  1976   and 1996  . HAND SURGERY  1992   tendons between thumb and forefinger  . SKIN BIOPSY     1994, 2006, 2008, 2010, 2011, 2011, 2012-  pre-cancerous  . TONSILLECTOMY  1955     Current Outpatient Medications  Medication Sig Dispense Refill  . albuterol (PROVENTIL) (2.5 MG/3ML) 0.083% nebulizer solution Take 3 mLs (2.5 mg total) by nebulization every 6 (six) hours as needed for wheezing or shortness of breath. 75 mL 12  . albuterol (VENTOLIN HFA) 108 (90 Base) MCG/ACT inhaler INHALE 2 PUFFS BY MOUTH INTO THE LUNGS IF NEEDED FOR WHEEZING AND OR SHORTNESS OF BREATH 8.5 g 2  . allopurinol (ZYLOPRIM) 300 MG tablet Take 300 mg by mouth daily.  1  . ALPRAZolam (XANAX) 0.5 MG tablet TAKE 1 TO 2 TABLETS BY MOUTH TWICE DAILY AS NEEDED FOR ANXIETY OR SLEEP 60 tablet 2  . azelastine (ASTELIN) 0.1 % nasal spray Place 2 sprays into both nostrils daily. Use in each nostril as directed 30 mL 5  . azelastine (OPTIVAR) 0.05 % ophthalmic solution Place 1 drop into both eyes 2 (two) times daily. 6 mL 12  . Azelastine HCl 0.15 % SOLN SMARTSIG:1 Spray(s) Both Nares Every Evening    . B Complex Vitamins (VITAMIN B COMPLEX PO) Take 1 tablet by mouth daily.    . Betamethasone Sodium Phosphate 6 MG/ML SOLN INJECT 2:4 SYRINGE BILATERALLY INTO EACH KNEE JOINT    . Biotin 5000 MCG CAPS Take 5,000 mcg by mouth daily.     . cholecalciferol (VITAMIN D) 1000 UNITS tablet Take 1,000 Units by mouth daily.    Marland Kitchen co-enzyme Q-10 50 MG capsule Take 400 mg by mouth daily.    . colchicine 0.6 MG tablet Take 0.6 mg by mouth daily as needed (flare  up).     . Cyanocobalamin (VITAMIN B-12 PO) Take 1 tablet by mouth daily.    . cycloSPORINE (RESTASIS) 0.05 % ophthalmic emulsion Apply to eye as needed.    . diclofenac sodium (VOLTAREN) 1 % GEL Apply 4 g topically 4 (four) times daily as needed. 200 g 11  . EPINEPHrine 0.3 mg/0.3 mL IJ  SOAJ injection Inject into the muscle.    Marland Kitchen EQL Natural Zinc 50 MG TABS Take 50 mg by mouth daily.    Marland Kitchen escitalopram (LEXAPRO) 10 MG tablet TAKE 1 TABLET(10 MG) BY MOUTH DAILY 90 tablet 3  . fexofenadine (ALLEGRA) 180 MG tablet TAKE 1 TABLET BY MOUTH EVERY DAY 90 tablet 1  . fluticasone (FLONASE) 50 MCG/ACT nasal spray Place 1 spray into both nostrils every morning.    . furosemide (LASIX) 40 MG tablet Take 1 tablet (40 mg total) by mouth daily. 90 tablet  3  . gabapentin (NEURONTIN) 300 MG capsule Take 1 capsule (300 mg total) by mouth 3 (three) times daily. 270 capsule 1  . hydrochlorothiazide (HYDRODIURIL) 25 MG tablet Take 25 mg by mouth 2 (two) times daily.    Marland Kitchen levalbuterol (XOPENEX HFA) 45 MCG/ACT inhaler Inhale 2 puffs into the lungs every 6 (six) hours as needed for wheezing or shortness of breath. 3 Inhaler 3  . levocetirizine (XYZAL) 5 MG tablet Take 1 tablet (5 mg total) by mouth every evening. 30 tablet 11  . levothyroxine (SYNTHROID) 75 MCG tablet TAKE 1 TABLET(75 MCG) BY MOUTH DAILY 90 tablet 1  . metoprolol succinate (TOPROL-XL) 25 MG 24 hr tablet TAKE 1/2 TABLET(12.5 MG) BY MOUTH DAILY 45 tablet 3  . montelukast (SINGULAIR) 10 MG tablet Take 1 tablet (10 mg total) by mouth at bedtime. 90 tablet 1  . mupirocin ointment (BACTROBAN) 2 % Use twice a day as directed 30 g 1  . nystatin cream (MYCOSTATIN) Apply 1 application topically daily as needed for dry skin.   0  . ondansetron (ZOFRAN) 4 MG tablet Take 1 tablet (4 mg total) by mouth every 8 (eight) hours as needed for nausea or vomiting. 30 tablet 1  . potassium chloride SA (KLOR-CON) 20 MEQ tablet Take 1 tablet (20 mEq total) by mouth daily. 90 tablet 3  . Pyridoxine HCl (VITAMIN B-6 PO) Take 1 tablet by mouth daily.    Marland Kitchen [place] 420 MG/3.5ML SOCT INJECT 1 UNITS INTO THE SKIN EVERY 30 DAYS 3.5 mL 11  . Saccharomyces boulardii (FLORASTOR PO) Take 2 capsules by mouth daily.    Marland Kitchen tiZANidine (ZANAFLEX) 2 MG tablet  Take 1 tablet (2 mg total) by mouth at bedtime. 90 tablet 1  . traMADol-acetaminophen (ULTRACET) 37.5-325 MG tablet Take 1-2 tablets by mouth every 6 (six) hours as needed for moderate pain or severe pain.   0  . triamcinolone (NASACORT AQ) 55 MCG/ACT AERO nasal inhaler Place 2 sprays daily into the nose. 1 Inhaler 12  . triamcinolone cream (KENALOG) 0.1 % Apply 1 application topically 2 (two) times daily. For 2 weeks, then as needed 80 g 2  . valACYclovir (VALTREX) 1000 MG tablet TAKE 2 TABLETS BY MOUTH TWICE DAILY FOR 1 DAY-TAKE DOSES 12 HOURS APART    . vitamin C (ASCORBIC ACID) 500 MG tablet Take 500 mg by mouth daily.    Marland Kitchen VITAMIN E PO Take 1 tablet by mouth daily.    Grant Ruts INHUB 500-50 MCG/DOSE AEPB Inhale 1 puff into the lungs 2 (two) times daily. 60 each 11  . XARELTO 20 MG TABS tablet TAKE 1 TABLET(20 MG) BY MOUTH DAILY WITH SUPPER 90 tablet 2   No current facility-administered medications for this visit.    Allergies:   Crestor [rosuvastatin calcium], Hydrocodone-acetaminophen, Lidocaine hcl, Lipitor [atorvastatin calcium], Procaine, Rosuvastatin, Theophylline, Vicodin [hydrocodone-acetaminophen], Codeine, Oxycodone-acetaminophen, Propoxyphene, Quinine derivatives, Sulfa antibiotics, Atorvastatin, Ephedrine, Other, Oxycodone, Cephalexin, Epinephrine, Penicillins, and Theophyllines    Social History:  The patient  reports that she quit smoking about 57 years ago. She has a 4.00 pack-year smoking history. She has never used smokeless tobacco. She reports that she does not drink alcohol and does not use drugs.   Family History:  The patient's family history includes Asthma in her sister; Cancer in her father; Heart attack in her brother and father; Heart disease in her brother and father.    ROS:  Please see the history of present illness.   Otherwise, review  of systems are positive for left leg bruising post fall.   All other systems are reviewed and negative.    PHYSICAL  EXAM: VS:  BP 138/84   Pulse 81   Ht $R'5\' 6"'qh$  (1.676 m)   Wt 294 lb 9.6 oz (133.6 kg)   SpO2 95%   BMI 47.55 kg/m  , BMI Body mass index is 47.55 kg/m. GEN: Well nourished, well developed, in no acute distress  HEENT: normal  Neck: no JVD, carotid bruits, or masses Cardiac: irregularly irregular; no murmurs, rubs, or gallops,; left leg edema  Respiratory:  clear to auscultation bilaterally, normal work of breathing GI: soft, nontender, nondistended, + BS MS: no deformity or atrophy  Skin: warm and dry, no rash Neuro:  Strength and sensation are intact Psych: euthymic mood, full affect   EKG:   The ekg ordered 8/21 demonstrates AFib, controlled ventricular response   Recent Labs: 11/15/2019: Brain Natriuretic Peptide 72 04/17/2020: ALT 11; BUN 15; Creatinine, Ser 0.74; Hemoglobin 12.9; Platelets 143.0; Potassium 3.4; Sodium 143; TSH 3.99   Lipid Panel    Component Value Date/Time   CHOL 120 04/17/2020 0831   CHOL 133 06/09/2019 0921   TRIG 89.0 04/17/2020 0831   HDL 55.70 04/17/2020 0831   HDL 57 06/09/2019 0921   CHOLHDL 2 04/17/2020 0831   VLDL 17.8 04/17/2020 0831   LDLCALC 47 04/17/2020 0831   LDLCALC 57 06/09/2019 0921   LDLDIRECT 107.0 09/17/2016 1221     Other studies Reviewed: Additional studies/ records that were reviewed today with results demonstrating: prior records reviewed.   ASSESSMENT AND PLAN:  1. Chronic AFib: Rate controlled.  Metoprolol for rate control.  2. Chronic diastolic heart failure: Appears euvolemic.  3. CAD: No angina.  Continue aggressive secondary prevention.  4. Bradycardia: resolved 5. Hyperlipidemia: The current medical regimen is effective;  continue present plan and medications. 6. Anticoagulated: OK to hold Xarelto 2 days prior to knee injection.  Restart 1 day after injection if ok with ortho.   Current medicines are reviewed at length with the patient today.  The patient concerns regarding her medicines were  addressed.  The following changes have been made:  No change  Labs/ tests ordered today include:  No orders of the defined types were placed in this encounter.   Recommend 150 minutes/week of aerobic exercise Low fat, low carb, high fiber diet recommended  Disposition:   FU in 6 months   Signed, Larae Grooms, MD  06/27/2020 12:28 PM    [facility] [place], [place], [place]  [address] Phone: [phone]; Fax: [phone]

## 2020-06-26 DIAGNOSIS — M170 Bilateral primary osteoarthritis of knee: Secondary | ICD-10-CM | POA: Diagnosis not present

## 2020-06-27 ENCOUNTER — Other Ambulatory Visit: Payer: Self-pay

## 2020-06-27 ENCOUNTER — Ambulatory Visit: Payer: Medicare Other | Admitting: Interventional Cardiology

## 2020-06-27 ENCOUNTER — Encounter: Payer: Self-pay | Admitting: Interventional Cardiology

## 2020-06-27 VITALS — BP 138/84 | HR 81 | Ht 66.0 in | Wt 294.6 lb

## 2020-06-27 DIAGNOSIS — I2510 Atherosclerotic heart disease of native coronary artery without angina pectoris: Secondary | ICD-10-CM | POA: Diagnosis not present

## 2020-06-27 DIAGNOSIS — I482 Chronic atrial fibrillation, unspecified: Secondary | ICD-10-CM

## 2020-06-27 DIAGNOSIS — E782 Mixed hyperlipidemia: Secondary | ICD-10-CM | POA: Diagnosis not present

## 2020-06-27 DIAGNOSIS — R600 Localized edema: Secondary | ICD-10-CM | POA: Diagnosis not present

## 2020-06-27 DIAGNOSIS — I5032 Chronic diastolic (congestive) heart failure: Secondary | ICD-10-CM | POA: Diagnosis not present

## 2020-06-27 NOTE — Patient Instructions (Signed)
Medication Instructions:  Your physician recommends that you continue on your current medications as directed. Please refer to the Current Medication list given to you today.  *If you need a refill on your cardiac medications before your next appointment, please call your pharmacy*   Lab Work: none If you have labs (blood work) drawn today and your tests are completely normal, you will receive your results only by: Marland Kitchen MyChart Message (if you have MyChart) OR . A paper copy in the mail If you have any lab test that is abnormal or we need to change your treatment, we will call you to review the results.   Testing/Procedures: none   Follow-Up: At [facility], you and your health needs are our priority.  As part of our continuing mission to provide you with exceptional heart care, we have created designated Provider Care Teams.  These Care Teams include your primary Cardiologist (physician) and Advanced Practice Providers (APPs -  Physician Assistants and Nurse Practitioners) who all work together to provide you with the care you need, when you need it.  We recommend signing up for the patient portal called "MyChart".  Sign up information is provided on this After Visit Summary.  MyChart is used to connect with patients for Virtual Visits (Telemedicine).  Patients are able to view lab/test results, encounter notes, upcoming appointments, etc.  Non-urgent messages can be sent to your provider as well.   To learn more about what you can do with MyChart, go to [email or web address].    Your next appointment:   6 month(s)  The format for your next appointment:   In Person  Provider:   You may see Larae Grooms, MD or one of the following Advanced Practice Providers on your designated Care Team:    Melina Copa, PA-C  Ermalinda Barrios, PA-C    Other Instructions Hold Xarelto for 2 days prior to knee injection.  Resume day after injection if OK with physician doing procedure

## 2020-06-29 DIAGNOSIS — S8002XA Contusion of left knee, initial encounter: Secondary | ICD-10-CM | POA: Diagnosis not present

## 2020-06-29 DIAGNOSIS — M25562 Pain in left knee: Secondary | ICD-10-CM | POA: Diagnosis not present

## 2020-06-30 DIAGNOSIS — M25562 Pain in left knee: Secondary | ICD-10-CM | POA: Diagnosis not present

## 2020-07-14 ENCOUNTER — Other Ambulatory Visit: Payer: Self-pay | Admitting: Internal Medicine

## 2020-07-14 NOTE — Telephone Encounter (Signed)
Please refill as per office routine med refill policy (all routine meds refilled for 3 mo or monthly per pt preference up to one year from last visit, then month to month grace period for 3 mo, then further med refills will have to be denied)  

## 2020-07-21 DIAGNOSIS — M25562 Pain in left knee: Secondary | ICD-10-CM | POA: Diagnosis not present

## 2020-07-28 DIAGNOSIS — M25562 Pain in left knee: Secondary | ICD-10-CM | POA: Diagnosis not present

## 2020-08-05 ENCOUNTER — Other Ambulatory Visit: Payer: Self-pay | Admitting: Internal Medicine

## 2020-08-05 MED ORDER — ESCITALOPRAM OXALATE 10 MG PO TABS
ORAL_TABLET | ORAL | 1 refills | Status: DC
Start: 2020-08-05 — End: 2021-02-05

## 2020-08-09 DIAGNOSIS — M1712 Unilateral primary osteoarthritis, left knee: Secondary | ICD-10-CM | POA: Diagnosis not present

## 2020-08-09 DIAGNOSIS — M1711 Unilateral primary osteoarthritis, right knee: Secondary | ICD-10-CM | POA: Diagnosis not present

## 2020-08-10 ENCOUNTER — Other Ambulatory Visit: Payer: Self-pay | Admitting: Orthopaedic Surgery

## 2020-08-10 DIAGNOSIS — R519 Headache, unspecified: Secondary | ICD-10-CM

## 2020-08-12 ENCOUNTER — Other Ambulatory Visit: Payer: Self-pay

## 2020-08-12 ENCOUNTER — Ambulatory Visit
Admission: RE | Admit: 2020-08-12 | Discharge: 2020-08-12 | Disposition: A | Discharge status: Home / Self Care | Payer: Medicare Other | Source: Ambulatory Visit | Attending: Orthopaedic Surgery | Admitting: Orthopaedic Surgery

## 2020-08-12 DIAGNOSIS — R519 Headache, unspecified: Secondary | ICD-10-CM | POA: Diagnosis not present

## 2020-08-15 DIAGNOSIS — Z79899 Other long term (current) drug therapy: Secondary | ICD-10-CM | POA: Diagnosis not present

## 2020-08-15 DIAGNOSIS — M170 Bilateral primary osteoarthritis of knee: Secondary | ICD-10-CM | POA: Diagnosis not present

## 2020-08-15 DIAGNOSIS — M1009 Idiopathic gout, multiple sites: Secondary | ICD-10-CM | POA: Diagnosis not present

## 2020-08-15 DIAGNOSIS — M2550 Pain in unspecified joint: Secondary | ICD-10-CM | POA: Diagnosis not present

## 2020-08-28 DIAGNOSIS — M1711 Unilateral primary osteoarthritis, right knee: Secondary | ICD-10-CM | POA: Diagnosis not present

## 2020-08-28 DIAGNOSIS — M1712 Unilateral primary osteoarthritis, left knee: Secondary | ICD-10-CM | POA: Diagnosis not present

## 2020-08-28 DIAGNOSIS — M25562 Pain in left knee: Secondary | ICD-10-CM | POA: Diagnosis not present

## 2020-08-29 ENCOUNTER — Encounter: Payer: Self-pay | Admitting: Internal Medicine

## 2020-08-30 ENCOUNTER — Encounter: Payer: Self-pay | Admitting: Internal Medicine

## 2020-08-30 MED ORDER — ONDANSETRON HCL 4 MG PO TABS: 4.0000 mg | ORAL_TABLET | Freq: Three times a day (TID) | ORAL | 3 refills | Status: DC | PRN

## 2020-09-05 ENCOUNTER — Other Ambulatory Visit: Payer: Self-pay

## 2020-09-05 ENCOUNTER — Ambulatory Visit: Payer: Medicare Other | Admitting: Family Medicine

## 2020-09-05 ENCOUNTER — Encounter: Payer: Self-pay | Admitting: Family Medicine

## 2020-09-05 VITALS — BP 118/92 | HR 75 | Ht 66.0 in | Wt 294.0 lb

## 2020-09-05 DIAGNOSIS — M2550 Pain in unspecified joint: Secondary | ICD-10-CM

## 2020-09-05 DIAGNOSIS — R9082 White matter disease, unspecified: Secondary | ICD-10-CM | POA: Diagnosis not present

## 2020-09-05 DIAGNOSIS — R2689 Other abnormalities of gait and mobility: Secondary | ICD-10-CM | POA: Diagnosis not present

## 2020-09-05 DIAGNOSIS — W19XXXA Unspecified fall, initial encounter: Secondary | ICD-10-CM | POA: Insufficient documentation

## 2020-09-05 NOTE — Assessment & Plan Note (Signed)
Patient has had difficulty and has recently had falls.  I do think that patient though does not have any signs of a concussion but is having difficulty with confidence at the moment.  We discussed with patient that we will try to keep everything as natural as possible.  Once avoid significant number of medications if she can.  We will get some laboratory work-up done to see if anything else is contributing to what she is having.  Did review patient's CT scan of the head did not show any significant bleed with patient being on a blood thinner.  Patient will did have some mild atrophy of the white matter and will send to neurology as well for further evaluation and make sure that there is nothing else that is concerning.  We will get laboratory work-up with patient due for her 46-month labs anyhow.  Patient will start with formal physical therapy for balance and coordination to help her will increase her confidence.  I believe patient will do well.  Follow-up with me again in 6 weeks.

## 2020-09-05 NOTE — Patient Instructions (Addendum)
PT will call you Have neurology weigh in based on CT scan Choline 500mg  daily Arnica lotion to bruises Vit D 2000IU daily Have appt again in 6 weeks

## 2020-09-05 NOTE — Progress Notes (Signed)
[place] [facility] [facility] [place] Phone: [phone] Subjective:   Fontaine No, am serving as a scribe for Dr. Hulan Saas. This visit occurred during the SARS-CoV-2 public health emergency.  Safety protocols were in place, including screening questions prior to the visit, additional usage of staff PPE, and extensive cleaning of exam room while observing appropriate contact time as indicated for disinfecting solutions.   I'm seeing this patient by the request  of:  Biagio Borg, MD  CC: Frequent falls  PNT:[id number]  MALEEYAH [name] is a 85 y.o. female coming in with complaint of falls. Last seen in 2019 for head injury after falling out of bed and hitting head on nightstand. Patient states that she had 2 falls this year. One on 06/19/2020 and then 2 weeks after that she fell again after getting tangled up in a walker. Suffered L knee injury and developed hematoma which was drained by Dr. Mayer Camel. Hit head both falls. (+) LOC with second fall. Patient did have other injuries and thus did not come into our office to be seen for head injury. Sleeping more than usual. Patient sleeps most of the day. Pain in neck as well as tightness. Patient is having intermittent headaches and forgetfulness that seems worse than her norm. Uses smaller walker now which is helpful vs the larger one she tripped on.  Patient does state that if she is getting a little bit better.  Patient has noticed that she is getting better at her [place] again.  Patient is accompanied with husband who states that she does seem to be getting a little better but still is having incontinence issues.  Patient is concerned that she may fall again.  Wanting to know what else she can possibly do.  Patient feels like the certain symptoms such as headaches have completely resolved but still having some mild difficulty with intermittent confusion and being fatigued.     Patient did have a CT of the  head done August 12, 2020.  Showed diffuse atrophy consistent with small vessel disease.  No acute changes noted.  Past Medical History:  Diagnosis Date  . Allergy   . Anxiety   . Arthritis    no cartlidge in knees  . Asthma   . Bradycardia    a. atenolol stopped due to this, HR 40s.  . 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 CAD in native artery    a.  CAD s/p RCA stent 2000 with residual mild-mod disease of left system 2001.  Marland Kitchen Chronic atrial fibrillation ([place])   . COPD (chronic obstructive pulmonary disease) ([place])   . Essential hypertension 03/24/2015  . Gout    post operative  . Hx of radiation therapy 04/02/11 -05/20/11   left breast  . Hyperlipidemia   . Hypothyroidism 03/24/2015  . Incontinence of urine   . Malignant neoplasm of upper-outer quadrant of female breast ([place]) 12/26/2010  . Osteoporosis 03/24/2015  . Peripheral neuropathy 03/24/2015  . Plantar fasciitis   . Sleep apnea    Past Surgical History:  Procedure Laterality Date  . BREAST LUMPECTOMY W/ NEEDLE LOCALIZATION  01/29/2011   Left with [facility] Dr Margot Chimes  . CHOLECYSTECTOMY  1955  . CORONARY ANGIOPLASTY WITH STENT PLACEMENT  2000  . DILATION AND CURETTAGE OF UTERUS  1976   and 1996  . HAND SURGERY  1992   tendons between thumb and forefinger  . SKIN BIOPSY  1994, 2006, 2008, 2010, 2011, 2011, 2012-  pre-cancerous  . TONSILLECTOMY  1955   Social History   Socioeconomic History  . Marital status: Married    Spouse name: Not on file  . Number of children: Not on file  . Years of education: Not on file  . Highest education level: Not on file  Occupational History  . Not on file  Tobacco Use  . Smoking status: Former Smoker    Packs/day: 1.00    Years: 4.00    Pack years: 4.00    Quit date: 12/05/1962    Years since quitting: 57.7  . Smokeless tobacco: Never Used  Vaping Use  . Vaping Use: Never used  Substance and Sexual Activity  . Alcohol use: No  . Drug use: No  . Sexual  activity: Not on file  Other Topics Concern  . Not on file  Social History Narrative  . Not on file   Social Determinants of Health   Financial Resource Strain: Low Risk   . Difficulty of Paying Living Expenses: Not hard at all  Food Insecurity: No Food Insecurity  . Worried About Programme researcher, broadcasting/film/video in the Last Year: Never true  . Ran Out of Food in the Last Year: Never true  Transportation Needs: No Transportation Needs  . Lack of Transportation (Medical): No  . Lack of Transportation (Non-Medical): No  Physical Activity: Inactive  . Days of Exercise per Week: 0 days  . Minutes of Exercise per Session: 0 min  Stress: No Stress Concern Present  . Feeling of Stress : Not at all  Social Connections: Socially Integrated  . Frequency of Communication with Friends and Family: More than three times a week  . Frequency of Social Gatherings with Friends and Family: More than three times a week  . Attends Religious Services: More than 4 times per year  . Active Member of Clubs or Organizations: Yes  . Attends Banker Meetings: More than 4 times per year  . Marital Status: Married   Allergies  Allergen Reactions  . Crestor [Rosuvastatin Calcium]     Severe liver function problems  . Hydrocodone-Acetaminophen Anxiety and Itching  . Lidocaine Hcl Other (See Comments)    Novacaine:  Becomes very shaky  . Lipitor [Atorvastatin Calcium] Other (See Comments)    Elevated liver function   . Procaine Other (See Comments)    shaking  . Rosuvastatin Other (See Comments)    Severe liver function problems  . Theophylline Other (See Comments)    Becomes very shaky skaking skaking  . Vicodin [Hydrocodone-Acetaminophen] Itching    Itching all over  . Codeine Nausea Only, Anxiety and Other (See Comments)    Also complained of dizziness.  Has same problems with oxycodone and hydrocodone Visual Disturbance, Balance Difficulty, Dizzy "Room Spinning; "Swimming" Balance, vision,  nausea, dizzy, "swimming", "room spinning" Balance, vision, nausea, dizzy, "swimming", "room spinning"  . Oxycodone-Acetaminophen Nausea Only, Anxiety and Other (See Comments)    Visual Disturbance, Balance Difficulty, Dizzy "Room Spinning; "Swimming"  . Propoxyphene Anxiety, Nausea Only and Other (See Comments)    Visual Disturbance, Balance Difficulty, Dizzy "Room Spinning; "Swimming" Balance, vision, nausea, dizzy, "swimming", "room spinning" Balance, vision, nausea, dizzy, "swimming, "room spinning"  . Quinine Derivatives Nausea Only    dizzy  . Sulfa Antibiotics Nausea Only    Also complained of dizziness  . Atorvastatin Other (See Comments)    Severe liver function problems  . Ephedrine Other (See Comments)  shaking  . Other Other (See Comments)    Quinine causes nausea, dizzy  . Oxycodone Other (See Comments)    Balance, vision, nausea, dizzy, "swimming", "room spinning"  . Cephalexin Rash  . Epinephrine Other (See Comments)    Patient becomes "shaky" shaking shaking  . Penicillins Rash    Pt reported all over body rash in 1958 Has patient had a PCN reaction causing immediate rash, facial/tongue/throat swelling, SOB or lightheadedness with hypotension:Yes Has patient had a PCN reaction causing severe rash involving mucus membranes or skin necrosis: No Has patient had a PCN reaction that required hospitalization: No Has patient had a PCN reaction occurring within the last 10 years:No    . Theophyllines Other (See Comments)    Patient becomes "shaky"   Family History  Problem Relation Age of Onset  . Heart disease Father   . Cancer Father        intestinal polyps  . Heart attack Father   . Asthma Sister   . Heart disease Brother   . Heart attack Brother     Current Outpatient Medications (Endocrine & Metabolic):  Marland Kitchen  Betamethasone Sodium Phosphate 6 MG/ML SOLN, INJECT 2:4 SYRINGE BILATERALLY INTO EACH KNEE JOINT .  levothyroxine (SYNTHROID) 75 MCG tablet, TAKE 1  TABLET(75 MCG) BY MOUTH DAILY  Current Outpatient Medications (Cardiovascular):  Marland Kitchen  EPINEPHrine 0.3 mg/0.3 mL IJ SOAJ injection, Inject into the muscle. .  furosemide (LASIX) 40 MG tablet, Take 1 tablet (40 mg total) by mouth daily. .  hydrochlorothiazide (HYDRODIURIL) 25 MG tablet, TAKE 2 TABLETS(50 MG) BY MOUTH DAILY .  metoprolol succinate (TOPROL-XL) 25 MG 24 hr tablet, TAKE 1/2 TABLET(12.5 MG) BY MOUTH DAILY .  [place] 420 MG/3.5ML SOCT, INJECT 1 UNITS INTO THE SKIN EVERY 30 DAYS  Current Outpatient Medications (Respiratory):  .  albuterol (PROVENTIL) (2.5 MG/3ML) 0.083% nebulizer solution, Take 3 mLs (2.5 mg total) by nebulization every 6 (six) hours as needed for wheezing or shortness of breath. Marland Kitchen  albuterol (VENTOLIN HFA) 108 (90 Base) MCG/ACT inhaler, INHALE 2 PUFFS BY MOUTH INTO THE LUNGS IF NEEDED FOR WHEEZING AND OR SHORTNESS OF BREATH .  ALLERGY RELIEF 180 MG tablet, TAKE 1 TABLET BY MOUTH EVERY DAY .  azelastine (ASTELIN) 0.1 % nasal spray, Place 2 sprays into both nostrils daily. Use in each nostril as directed .  Azelastine HCl 0.15 % SOLN, SMARTSIG:1 Spray(s) Both Nares Every Evening .  fluticasone (FLONASE) 50 MCG/ACT nasal spray, Place 1 spray into both nostrils every morning. .  levalbuterol (XOPENEX HFA) 45 MCG/ACT inhaler, Inhale 2 puffs into the lungs every 6 (six) hours as needed for wheezing or shortness of breath. .  levocetirizine (XYZAL) 5 MG tablet, Take 1 tablet (5 mg total) by mouth every evening. .  montelukast (SINGULAIR) 10 MG tablet, Take 1 tablet (10 mg total) by mouth at bedtime. .  triamcinolone (NASACORT AQ) 55 MCG/ACT AERO nasal inhaler, Place 2 sprays daily into the nose. [place] INHUB 500-50 MCG/DOSE AEPB, Inhale 1 puff into the lungs 2 (two) times daily.  Current Outpatient Medications (Analgesics):  .  allopurinol (ZYLOPRIM) 300 MG tablet, Take 300 mg by mouth daily. .  colchicine 0.6 MG tablet, Take 0.6 mg by mouth daily as  needed (flare  up).  .  traMADol-acetaminophen (ULTRACET) 37.5-325 MG tablet, Take 1-2 tablets by mouth every 6 (six) hours as needed for moderate pain or severe pain.   Current Outpatient Medications (Hematological):  Marland Kitchen  Cyanocobalamin (VITAMIN  B-12 PO), Take 1 tablet by mouth daily. Alveda Reasons 20 MG TABS tablet, TAKE 1 TABLET(20 MG) BY MOUTH DAILY WITH SUPPER  Current Outpatient Medications (Other):  Marland Kitchen  ALPRAZolam (XANAX) 0.5 MG tablet, TAKE 1 TO 2 TABLETS BY MOUTH TWICE DAILY AS NEEDED FOR ANXIETY OR SLEEP .  azelastine (OPTIVAR) 0.05 % ophthalmic solution, Place 1 drop into both eyes 2 (two) times daily. .  B Complex Vitamins (VITAMIN B COMPLEX PO), Take 1 tablet by mouth daily. .  Biotin 5000 MCG CAPS, Take 5,000 mcg by mouth daily.  .  cholecalciferol (VITAMIN D) 1000 UNITS tablet, Take 1,000 Units by mouth daily. Marland Kitchen  co-enzyme Q-10 50 MG capsule, Take 400 mg by mouth daily. .  cycloSPORINE (RESTASIS) 0.05 % ophthalmic emulsion, Apply to eye as needed. .  diclofenac sodium (VOLTAREN) 1 % GEL, Apply 4 g topically 4 (four) times daily as needed. Marland Kitchen  EQL Natural Zinc 50 MG TABS, Take 50 mg by mouth daily. Marland Kitchen  escitalopram (LEXAPRO) 10 MG tablet, TAKE 1 TABLET(10 MG) BY MOUTH DAILY .  gabapentin (NEURONTIN) 300 MG capsule, Take 1 capsule (300 mg total) by mouth 3 (three) times daily. .  mupirocin ointment (BACTROBAN) 2 %, Use twice a day as directed .  nystatin cream (MYCOSTATIN), Apply 1 application topically daily as needed for dry skin.  Marland Kitchen  ondansetron (ZOFRAN) 4 MG tablet, Take 1 tablet (4 mg total) by mouth every 8 (eight) hours as needed for nausea or vomiting. .  potassium chloride SA (KLOR-CON) 20 MEQ tablet, Take 1 tablet (20 mEq total) by mouth daily. .  Pyridoxine HCl (VITAMIN B-6 PO), Take 1 tablet by mouth daily. .  Saccharomyces boulardii (FLORASTOR PO), Take 2 capsules by mouth daily. Marland Kitchen  tiZANidine (ZANAFLEX) 2 MG tablet, Take 1 tablet (2 mg total) by mouth at bedtime. .   triamcinolone cream (KENALOG) 0.1 %, Apply 1 application topically 2 (two) times daily. For 2 weeks, then as needed .  valACYclovir (VALTREX) 1000 MG tablet, TAKE 2 TABLETS BY MOUTH TWICE DAILY FOR 1 DAY-TAKE DOSES 12 HOURS APART .  vitamin C (ASCORBIC ACID) 500 MG tablet, Take 500 mg by mouth daily. Marland Kitchen  VITAMIN E PO, Take 1 tablet by mouth daily.   Reviewed prior external information including notes and imaging from  primary care provider As well as notes that were available from care everywhere and other healthcare systems.  Past medical history, social, surgical and family history all reviewed in electronic medical record.  No pertanent information unless stated regarding to the chief complaint.   Review of Systems:  No headache, visual changes, nausea, vomiting, diarrhea, constipation,  abdominal pain, skin rash, fevers, chills, night sweats, weight loss, swollen lymph nodes, body aches, joint swelling, chest pain, shortness of breath, mood changes. POSITIVE muscle aches, intermittent dizziness  Objective  Blood pressure (!) 118/92, pulse 75, height $RemoveBe'5\' 6"'[id number]$  (1.676 m), weight 294 lb (133.4 kg), SpO2 96 %.   General: No apparent distress alert and oriented x3 mood and affect normal, dressed appropriately.  Morbidly obese HEENT: Pupils equal, extraocular movements intact  Respiratory: Patient's speak in full sentences and does not appear short of breath  Cardiovascular: Lower extremities show the patient does have some hemosiderin deposits.  No significant signs of any type of cellulitis at the moment.  Patient does have a resolving hematoma on the anterior aspect of the knee noted.  Patient does have arthritic changes noted of the knee.  Patient has no acute  bleeding at the moment but still is healing. Left arm exam does show hematoma also still noted on the anterior lateral aspect of the humerus. Patient is sitting in a wheelchair.  Neurovascularly intact distally but does have some numbness  on the anterior aspect of the left leg.  Seems to be right where patient did hit the leg.  Patient does have some mild tightness of the neck noted. Neurovascularly intact in the upper extremities.  Patient is significantly better with serial sevens, did do very well with recall.   Impression and Recommendations:     The above documentation has been reviewed and is accurate and complete Lyndal Pulley, DO

## 2020-09-06 LAB — CBC WITH DIFFERENTIAL/PLATELET
Basophils Absolute: 0 10*3/uL (ref 0.0–0.1)
Basophils Relative: 0.2 % (ref 0.0–3.0)
Eosinophils Absolute: 0.2 10*3/uL (ref 0.0–0.7)
Eosinophils Relative: 2.9 % (ref 0.0–5.0)
HCT: 42.9 % (ref 36.0–46.0)
Hemoglobin: 13.9 g/dL (ref 12.0–15.0)
Lymphocytes Relative: 20 % (ref 12.0–46.0)
Lymphs Abs: 1.4 10*3/uL (ref 0.7–4.0)
MCHC: 32.5 g/dL (ref 30.0–36.0)
MCV: 94.4 fl (ref 78.0–100.0)
Monocytes Absolute: 0.7 10*3/uL (ref 0.1–1.0)
Monocytes Relative: 9.9 % (ref 3.0–12.0)
Neutro Abs: 4.7 10*3/uL (ref 1.4–7.7)
Neutrophils Relative %: 67 % (ref 43.0–77.0)
Platelets: 204 10*3/uL (ref 150.0–400.0)
RBC: 4.54 Mil/uL (ref 3.87–5.11)
RDW: 16 % — ABNORMAL HIGH (ref 11.5–15.5)
WBC: 7 10*3/uL (ref 4.0–10.5)

## 2020-09-06 LAB — LIPID PANEL
Cholesterol: 145 mg/dL (ref 0–200)
HDL: 59 mg/dL (ref >39.00)
LDL Cholesterol: 72 mg/dL (ref 0–99)
NonHDL: 85.63
Total CHOL/HDL Ratio: 2
Triglycerides: 70 mg/dL (ref 0.0–149.0)
VLDL: 14 mg/dL (ref 0.0–40.0)

## 2020-09-06 LAB — COMPREHENSIVE METABOLIC PANEL
ALT: 14 U/L (ref 0–35)
AST: 19 U/L (ref 0–37)
Albumin: 4.1 g/dL (ref 3.5–5.2)
Alkaline Phosphatase: 65 U/L (ref 39–117)
BUN: 18 mg/dL (ref 6–23)
CO2: 30 mEq/L (ref 19–32)
Calcium: 10.2 mg/dL (ref 8.4–10.5)
Chloride: 103 mEq/L (ref 96–112)
Creatinine, Ser: 0.85 mg/dL (ref 0.40–1.20)
GFR: 63.53 mL/min (ref >60.00)
Glucose, Bld: 104 mg/dL — ABNORMAL HIGH (ref 70–99)
Potassium: 3.6 mEq/L (ref 3.5–5.1)
Sodium: 141 mEq/L (ref 135–145)
Total Bilirubin: 0.9 mg/dL (ref 0.2–1.2)
Total Protein: 6.9 g/dL (ref 6.0–8.3)

## 2020-09-06 LAB — VITAMIN D 25 HYDROXY (VIT D DEFICIENCY, FRACTURES): VITD: 32.84 ng/mL (ref 30.00–100.00)

## 2020-09-06 LAB — HEMOGLOBIN A1C: Hgb A1c MFr Bld: 6.1 % (ref 4.6–6.5)

## 2020-09-06 LAB — TSH: TSH: 3.82 u[IU]/mL (ref 0.35–4.50)

## 2020-09-14 DIAGNOSIS — H40013 Open angle with borderline findings, low risk, bilateral: Secondary | ICD-10-CM | POA: Diagnosis not present

## 2020-09-14 DIAGNOSIS — H31012 Macula scars of posterior pole (postinflammatory) (post-traumatic), left eye: Secondary | ICD-10-CM | POA: Diagnosis not present

## 2020-09-14 DIAGNOSIS — H35373 Puckering of macula, bilateral: Secondary | ICD-10-CM | POA: Diagnosis not present

## 2020-09-14 DIAGNOSIS — D3131 Benign neoplasm of right choroid: Secondary | ICD-10-CM | POA: Diagnosis not present

## 2020-09-14 DIAGNOSIS — H3091 Unspecified chorioretinal inflammation, right eye: Secondary | ICD-10-CM | POA: Diagnosis not present

## 2020-09-20 ENCOUNTER — Encounter: Payer: Self-pay | Admitting: Neurology

## 2020-09-26 DIAGNOSIS — M25562 Pain in left knee: Secondary | ICD-10-CM | POA: Diagnosis not present

## 2020-09-27 MED ORDER — METOPROLOL SUCCINATE ER 25 MG PO TB24: ORAL_TABLET | ORAL | 3 refills | Status: DC

## 2020-10-04 DIAGNOSIS — R2681 Unsteadiness on feet: Secondary | ICD-10-CM | POA: Diagnosis not present

## 2020-10-04 DIAGNOSIS — R531 Weakness: Secondary | ICD-10-CM | POA: Diagnosis not present

## 2020-10-09 DIAGNOSIS — M25562 Pain in left knee: Secondary | ICD-10-CM | POA: Diagnosis not present

## 2020-10-09 NOTE — Progress Notes (Signed)
NEUROLOGY CONSULTATION NOTE  [name] MRN: [id number] DOB: 07-30-1935  Referring provider: Antoine Primas, DO Primary care provider: Oliver Barre, MD  Reason for consult:  Frequent falls  Assessment/Plan:   1.  Unsteady gait with falls - I believe the primary etiology is the degenerative arthritis in her knees, neuropathy in her feet as a secondary cause 2.  Bilateral tremor in hands.  Somewhat parkinsonian but I would not diagnose definite Parkinson's disease.  She does not exhibit any rigidity or bradykinesia.  I think her balance problems are not likely related to potential underlying Parkinson's disease.  3.  Degenerative arthritis in the knees 4.  Polyneuropathy  1.  I would like her to follow up in 6 months to that I may re-evaluate and see if she has had any progression of symptoms that would more likely characterize Parkinson's disease.   Subjective:  [name] is an 85 year old right-handed female with atrial fibrillation, CAD, COPD, HTN, hypothyroidism, and arthritis and history of breast cancer who presents for frequent falls.  History supplemented by referring provider's note.  She reports that she had two mechanical falls in February.  One time, she slid around the doorframe causing her to fall and hit her head.  About a week later, she was having difficulty operating a new but smaller temporary walker (her walker broke).  Her legs got tangled up under it and she fell hitting the back of her head.  She had a probable concussion including headache, dizziness and memory problems.  She had a CT of the head on 08/12/2020, which was personally reviewed and showed mild generalized atrophy and chronic small vessel ischemic changes but no acute abnormality.  She has unsteady gait.  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  She does report that she sometimes has numbness and tingling in her feet at night, but no pain.  She denies back pain or radicular pain down  the legs.  She also has multilevel cervical spondylosis, as demonstrated by cervical X-ray on 06/10/2017.  For 15 years she has had a tremor in both hands.  It causes some difficulty writing but otherwise is manageable.  Labs from May 2022 include TSH 3.82, Hgb A1c 6.1, Vit D 32.84.  She takes supplemental B12.  PAST MEDICAL HISTORY: Past Medical History:  Diagnosis Date  . Allergy   . Anxiety   . Arthritis    no cartlidge in knees  . Asthma   . Bradycardia    a. atenolol stopped due to this, HR 40s.  . 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 CAD in native artery    a.  CAD s/p RCA stent 2000 with residual mild-mod disease of left system 2001.  Marland Kitchen Chronic atrial fibrillation (HCC)   . COPD (chronic obstructive pulmonary disease) (HCC)   . Essential hypertension 03/24/2015  . Gout    post operative  . Hx of radiation therapy 04/02/11 -05/20/11   left breast  . Hyperlipidemia   . Hypothyroidism 03/24/2015  . Incontinence of urine   . 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 Osteoporosis 03/24/2015  . Peripheral neuropathy 03/24/2015  . Plantar fasciitis   . Sleep apnea     PAST SURGICAL HISTORY: Past Surgical History:  Procedure Laterality Date  . BREAST LUMPECTOMY W/ NEEDLE LOCALIZATION  01/29/2011   Left with [facility] Dr Jamey Ripa  . CHOLECYSTECTOMY  1955  . CORONARY  ANGIOPLASTY WITH STENT PLACEMENT  2000  . DILATION AND CURETTAGE OF UTERUS  1976   and 1996  . HAND SURGERY  1992   tendons between thumb and forefinger  . SKIN BIOPSY     1994, 2006, 2008, 2010, 2011, 2011, 2012-  pre-cancerous  . TONSILLECTOMY  1955    MEDICATIONS: Current Outpatient Medications on File Prior to Visit  Medication Sig Dispense Refill  . albuterol (PROVENTIL) (2.5 MG/3ML) 0.083% nebulizer solution Take 3 mLs (2.5 mg total) by nebulization every 6 (six) hours as needed for wheezing or shortness of breath. 75 mL 12  . albuterol (VENTOLIN  HFA) 108 (90 Base) MCG/ACT inhaler INHALE 2 PUFFS BY MOUTH INTO THE LUNGS IF NEEDED FOR WHEEZING AND OR SHORTNESS OF BREATH 8.5 g 2  . ALLERGY RELIEF 180 MG tablet TAKE 1 TABLET BY MOUTH EVERY DAY 90 tablet 1  . allopurinol (ZYLOPRIM) 300 MG tablet Take 300 mg by mouth daily.  1  . ALPRAZolam (XANAX) 0.5 MG tablet TAKE 1 TO 2 TABLETS BY MOUTH TWICE DAILY AS NEEDED FOR ANXIETY OR SLEEP 60 tablet 2  . azelastine (ASTELIN) 0.1 % nasal spray Place 2 sprays into both nostrils daily. Use in each nostril as directed 30 mL 5  . azelastine (OPTIVAR) 0.05 % ophthalmic solution Place 1 drop into both eyes 2 (two) times daily. 6 mL 12  . Azelastine HCl 0.15 % SOLN SMARTSIG:1 Spray(s) Both Nares Every Evening    . B Complex Vitamins (VITAMIN B COMPLEX PO) Take 1 tablet by mouth daily.    . Betamethasone Sodium Phosphate 6 MG/ML SOLN INJECT 2:4 SYRINGE BILATERALLY INTO EACH KNEE JOINT    . Biotin 5000 MCG CAPS Take 5,000 mcg by mouth daily.     . cholecalciferol (VITAMIN D) 1000 UNITS tablet Take 1,000 Units by mouth daily.    Marland Kitchen co-enzyme Q-10 50 MG capsule Take 400 mg by mouth daily.    . colchicine 0.6 MG tablet Take 0.6 mg by mouth daily as needed (flare  up).     . Cyanocobalamin (VITAMIN B-12 PO) Take 1 tablet by mouth daily.    . cycloSPORINE (RESTASIS) 0.05 % ophthalmic emulsion Apply to eye as needed.    . diclofenac sodium (VOLTAREN) 1 % GEL Apply 4 g topically 4 (four) times daily as needed. 200 g 11  . EPINEPHrine 0.3 mg/0.3 mL IJ SOAJ injection Inject into the muscle.    Marland Kitchen EQL Natural Zinc 50 MG TABS Take 50 mg by mouth daily.    Marland Kitchen escitalopram (LEXAPRO) 10 MG tablet TAKE 1 TABLET(10 MG) BY MOUTH DAILY 90 tablet 1  . fluticasone (FLONASE) 50 MCG/ACT nasal spray Place 1 spray into both nostrils every morning.    . furosemide (LASIX) 40 MG tablet Take 1 tablet (40 mg total) by mouth daily. 90 tablet 3  . gabapentin (NEURONTIN) 300 MG capsule Take 1 capsule (300 mg total) by mouth 3 (three) times  daily. 270 capsule 1  . hydrochlorothiazide (HYDRODIURIL) 25 MG tablet TAKE 2 TABLETS(50 MG) BY MOUTH DAILY 180 tablet 1  . levalbuterol (XOPENEX HFA) 45 MCG/ACT inhaler Inhale 2 puffs into the lungs every 6 (six) hours as needed for wheezing or shortness of breath. 3 Inhaler 3  . levocetirizine (XYZAL) 5 MG tablet Take 1 tablet (5 mg total) by mouth every evening. 30 tablet 11  . levothyroxine (SYNTHROID) 75 MCG tablet TAKE 1 TABLET(75 MCG) BY MOUTH DAILY 90 tablet 1  . metoprolol succinate (TOPROL-XL) 25 MG  24 hr tablet TAKE 1/2 TABLET(12.5 MG) BY MOUTH DAILY 45 tablet 3  . montelukast (SINGULAIR) 10 MG tablet Take 1 tablet (10 mg total) by mouth at bedtime. 90 tablet 1  . mupirocin ointment (BACTROBAN) 2 % Use twice a day as directed 30 g 1  . nystatin cream (MYCOSTATIN) Apply 1 application topically daily as needed for dry skin.   0  . ondansetron (ZOFRAN) 4 MG tablet Take 1 tablet (4 mg total) by mouth every 8 (eight) hours as needed for nausea or vomiting. 40 tablet 3  . potassium chloride SA (KLOR-CON) 20 MEQ tablet Take 1 tablet (20 mEq total) by mouth daily. 90 tablet 3  . Pyridoxine HCl (VITAMIN B-6 PO) Take 1 tablet by mouth daily.    Marland Kitchen [place] 420 MG/3.5ML SOCT INJECT 1 UNITS INTO THE SKIN EVERY 30 DAYS 3.5 mL 11  . Saccharomyces boulardii (FLORASTOR PO) Take 2 capsules by mouth daily.    Marland Kitchen tiZANidine (ZANAFLEX) 2 MG tablet Take 1 tablet (2 mg total) by mouth at bedtime. 90 tablet 1  . traMADol-acetaminophen (ULTRACET) 37.5-325 MG tablet Take 1-2 tablets by mouth every 6 (six) hours as needed for moderate pain or severe pain.   0  . triamcinolone (NASACORT AQ) 55 MCG/ACT AERO nasal inhaler Place 2 sprays daily into the nose. 1 Inhaler 12  . triamcinolone cream (KENALOG) 0.1 % Apply 1 application topically 2 (two) times daily. For 2 weeks, then as needed 80 g 2  . valACYclovir (VALTREX) 1000 MG tablet TAKE 2 TABLETS BY MOUTH TWICE DAILY FOR 1 DAY-TAKE DOSES 12 HOURS  APART    . vitamin C (ASCORBIC ACID) 500 MG tablet Take 500 mg by mouth daily.    Marland Kitchen VITAMIN E PO Take 1 tablet by mouth daily.    Grant Ruts INHUB 500-50 MCG/DOSE AEPB Inhale 1 puff into the lungs 2 (two) times daily. 60 each 11  . XARELTO 20 MG TABS tablet TAKE 1 TABLET(20 MG) BY MOUTH DAILY WITH SUPPER 90 tablet 2   No current facility-administered medications on file prior to visit.    ALLERGIES: Allergies  Allergen Reactions  . Crestor [Rosuvastatin Calcium]     Severe liver function problems  . Hydrocodone-Acetaminophen Anxiety and Itching  . Lidocaine Hcl Other (See Comments)    Novacaine:  Becomes very shaky  . Lipitor [Atorvastatin Calcium] Other (See Comments)    Elevated liver function   . Procaine Other (See Comments)    shaking  . Rosuvastatin Other (See Comments)    Severe liver function problems  . Theophylline Other (See Comments)    Becomes very shaky skaking skaking  . Vicodin [Hydrocodone-Acetaminophen] Itching    Itching all over  . Codeine Nausea Only, Anxiety and Other (See Comments)    Also complained of dizziness.  Has same problems with oxycodone and hydrocodone Visual Disturbance, Balance Difficulty, Dizzy "Room Spinning; "Swimming" Balance, vision, nausea, dizzy, "swimming", "room spinning" Balance, vision, nausea, dizzy, "swimming", "room spinning"  . Oxycodone-Acetaminophen Nausea Only, Anxiety and Other (See Comments)    Visual Disturbance, Balance Difficulty, Dizzy "Room Spinning; "Swimming"  . Propoxyphene Anxiety, Nausea Only and Other (See Comments)    Visual Disturbance, Balance Difficulty, Dizzy "Room Spinning; "Swimming" Balance, vision, nausea, dizzy, "swimming", "room spinning" Balance, vision, nausea, dizzy, "swimming, "room spinning"  . Quinine Derivatives Nausea Only    dizzy  . Sulfa Antibiotics Nausea Only    Also complained of dizziness  . Atorvastatin Other (See Comments)  Severe liver function problems  . Ephedrine Other (See  Comments)    shaking  . Other Other (See Comments)    Quinine causes nausea, dizzy  . Oxycodone Other (See Comments)    Balance, vision, nausea, dizzy, "swimming", "room spinning"  . Cephalexin Rash  . Epinephrine Other (See Comments)    Patient becomes "shaky" shaking shaking  . Penicillins Rash    Pt reported all over body rash in 1958 Has patient had a PCN reaction causing immediate rash, facial/tongue/throat swelling, SOB or lightheadedness with hypotension:Yes Has patient had a PCN reaction causing severe rash involving mucus membranes or skin necrosis: No Has patient had a PCN reaction that required hospitalization: No Has patient had a PCN reaction occurring within the last 10 years:No    . Theophyllines Other (See Comments)    Patient becomes "shaky"    FAMILY HISTORY: Family History  Problem Relation Age of Onset  . Heart disease Father   . Cancer Father        intestinal polyps  . Heart attack Father   . Asthma Sister   . Heart disease Brother   . Heart attack Brother     Objective:  Blood pressure (!) 152/87, pulse 77, height $RemoveBe'5\' 6"'[id number]$  (1.676 m), weight 294 lb (133.4 kg), SpO2 95 %. General: No acute distress.  Patient appears well-groomed.   Head:  Normocephalic/atraumatic Eyes:  fundi examined but not visualized Neck: supple, no paraspinal tenderness, full range of motion Back: No paraspinal tenderness Heart: regular rate and rhythm Lungs: Clear to auscultation bilaterally. Vascular: No carotid bruits. Neurological Exam: Mental status: alert and oriented to person, place, and time, recent and remote memory intact, fund of knowledge intact, attention and concentration intact, speech fluent and not dysarthric, language intact. Cranial nerves: CN I: not tested CN II: pupils equal, round and reactive to light, visual fields intact CN III, IV, VI:  full range of motion, no nystagmus, no ptosis CN V: facial sensation intact. CN VII: upper and lower face  symmetric CN VIII: hearing intact CN IX, X: gag intact, uvula midline CN XI: sternocleidomastoid and trapezius muscles intact CN XII: tongue midline Bulk & Tone: normal, no fasciculations.  No rigidity. Motor:  muscle strength 5/5 throughout.  No bradykinesia.  Intermittent resting and postural tremor in hands.   Sensation:  Pinprick sensation intact.   Deep Tendon Reflexes:  1+ throughout except absent in ankles,  toes downgoing.   Finger to nose testing:  Without dysmetria.   Gait:  Wide-based antalgic gait.  Romberg negative.    Thank you for allowing me to take part in the care of this patient.  Metta Clines, DO  CC:  Hulan Saas, DO  Cathlean Cower, MD

## 2020-10-10 ENCOUNTER — Encounter: Payer: Self-pay | Admitting: Neurology

## 2020-10-10 ENCOUNTER — Other Ambulatory Visit: Payer: Self-pay

## 2020-10-10 ENCOUNTER — Encounter: Payer: Self-pay | Admitting: Oncology

## 2020-10-10 ENCOUNTER — Ambulatory Visit: Payer: Medicare Other | Admitting: Neurology

## 2020-10-10 VITALS — BP 152/87 | HR 77 | Ht 66.0 in | Wt 294.0 lb

## 2020-10-10 DIAGNOSIS — Z9181 History of falling: Secondary | ICD-10-CM | POA: Diagnosis not present

## 2020-10-10 DIAGNOSIS — G6289 Other specified polyneuropathies: Secondary | ICD-10-CM | POA: Diagnosis not present

## 2020-10-10 DIAGNOSIS — R2681 Unsteadiness on feet: Secondary | ICD-10-CM | POA: Diagnosis not present

## 2020-10-10 DIAGNOSIS — R251 Tremor, unspecified: Secondary | ICD-10-CM

## 2020-10-10 DIAGNOSIS — M170 Bilateral primary osteoarthritis of knee: Secondary | ICD-10-CM | POA: Diagnosis not present

## 2020-10-10 NOTE — Patient Instructions (Signed)
At this time, I cannot definitively say that you have Parkinson's disease.  It think the primary cause for your balance problems are the arthritis in the knees and neuropathy in feet.  I would like to reevaluate in 6 months to look for any changes.

## 2020-10-12 ENCOUNTER — Encounter: Payer: Self-pay | Admitting: Internal Medicine

## 2020-10-12 DIAGNOSIS — E538 Deficiency of other specified B group vitamins: Secondary | ICD-10-CM

## 2020-10-12 DIAGNOSIS — I10 Essential (primary) hypertension: Secondary | ICD-10-CM

## 2020-10-12 DIAGNOSIS — E559 Vitamin D deficiency, unspecified: Secondary | ICD-10-CM

## 2020-10-12 DIAGNOSIS — R739 Hyperglycemia, unspecified: Secondary | ICD-10-CM

## 2020-10-16 NOTE — Progress Notes (Signed)
[place] Phone: [phone] Subjective:   [name], am serving as a scribe for Dr. Hulan Saas.  This visit occurred during the SARS-CoV-2 public health emergency.  Safety protocols were in place, including screening questions prior to the visit, additional usage of staff PPE, and extensive cleaning of exam room while observing appropriate contact time as indicated for disinfecting solutions.    I'm seeing this patient by the request  of:  Biagio Borg, MD  CC: Foot pain, knee pain, recent falls  MHD:[id number]  09/05/2020 Patient has had difficulty and has recently had falls.  I do think that patient though does not have any signs of a concussion but is having difficulty with confidence at the moment.  We discussed with patient that we will try to keep everything as natural as possible.  Once avoid significant number of medications if she can.  We will get some laboratory work-up done to see if anything else is contributing to what she is having.  Did review patient's CT scan of the head did not show any significant bleed with patient being on a blood thinner.  Patient will did have some mild atrophy of the white matter and will send to neurology as well for further evaluation and make sure that there is nothing else that is concerning.  We will get laboratory work-up with patient due for her 67-month labs anyhow.  Patient will start with formal physical therapy for balance and coordination to help her will increase her confidence.  I believe patient will do well.  Follow-up with me again in 6 weeks.  Update 10/17/2020 [name] is a 85 y.o. female coming in with complaint of falls. Patient was to start PT at [facility] and see neurology. Patient states that she is doing better since last visit. Patient went to PT for one visit and had some knee pain so has not gone back.   Saw Dr. Tomi Likens as well and patient states  that she does not have to do anything different at this time. Follow-up as needed with him.     Patient was referred to neurology for further evaluation for potential Parkinson's.  At this time they believe that the balance is more secondary to arthritic changes in the knee and the neuropathy in the feet.  Patient states that the foot pain seems to be the worst at the moment as well as the numbness.  Patient has been wearing good shoes but sometimes does not notice if it makes a good improvement or not.  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 atenolol stopped due to this, HR 40s.   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  CAD s/p RCA stent 2000 with residual mild-mod disease of left system 2001.   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   Osteoporosis 03/24/2015   Peripheral  neuropathy 03/24/2015   Plantar fasciitis    Sleep apnea    Past Surgical History:  Procedure Laterality Date   BREAST LUMPECTOMY W/ NEEDLE LOCALIZATION  01/29/2011   Left with [facility] Dr Margot Chimes   CHOLECYSTECTOMY  1955   CORONARY ANGIOPLASTY WITH STENT PLACEMENT  2000   DILATION AND CURETTAGE OF UTERUS  1976   and [place]   tendons between thumb and forefinger   SKIN BIOPSY     1994, 2006, 2008, 2010, 2011, 2011, 2012-  pre-cancerous   TONSILLECTOMY  1955   Social History   Socioeconomic History   Marital status: Married    Spouse name: Not on file   Number of children: Not on file   Years of education: Not on file   Highest education  level: Not on file  Occupational History   Not on file  Tobacco Use   Smoking status: Former    Packs/day: 1.00    Years: 4.00    Pack years: 4.00    Types: Cigarettes    Quit date: 12/05/1962    Years since quitting: 57.9   Smokeless tobacco: Never  Vaping Use   Vaping Use: Never used  Substance and Sexual Activity   Alcohol use: [name]   Drug use: [name]   Sexual activity: Not on file  Other Topics Concern   Not on file  Social History Narrative   Right handed   Social Determinants of Health   Financial Resource Strain: Low Risk    Difficulty of Paying Living Expenses: Not hard at all  Food Insecurity: [name] Food Insecurity   Worried About Charity fundraiser in the Last Year: Never true   [place] in the Last Year: Never true  Transportation Needs: [name] Transportation Needs   Lack of Transportation (Medical): [name]   Lack of Transportation (Non-Medical): [name]  Physical Activity: Inactive   Days of Exercise per Week: 0 days   Minutes of Exercise per Session: 0 min  Stress: [name] Stress Concern Present   Feeling of Stress : Not at all  Social Connections: Socially Integrated   Frequency of Communication with Friends and Family: More than three times a week   Frequency of Social Gatherings with Friends and Family: More than three times a week   Attends Religious Services: More than 4 times per year   Active Member of [employer] or Organizations: Yes   Attends Music therapist: More than 4 times per year   Marital Status: Married   Allergies  Allergen Reactions   Crestor [Rosuvastatin Calcium]     Severe liver function problems   Hydrocodone-Acetaminophen Anxiety and Itching   Lidocaine Hcl Other (See Comments)    Novacaine:  Becomes very shaky   Lipitor [Atorvastatin Calcium] Other (See Comments)    Elevated liver function    Procaine Other (See Comments)    shaking   Rosuvastatin Other (See Comments)    Severe liver function problems   Theophylline Other (See  Comments)    Becomes very shaky skaking skaking   Vicodin [Hydrocodone-Acetaminophen] Itching    Itching all over   Codeine Nausea Only, Anxiety and Other (See Comments)    Also complained of dizziness.  Has same problems with oxycodone and hydrocodone Visual Disturbance, Balance Difficulty, Dizzy "Room Spinning; "Swimming" Balance, vision, nausea, dizzy, "swimming", "room spinning" Balance, vision, nausea, dizzy, "swimming", "room spinning"   Oxycodone-Acetaminophen Nausea Only, Anxiety and Other (See Comments)  Visual Disturbance, Balance Difficulty, Dizzy "Room Spinning; "Swimming"   Propoxyphene Anxiety, Nausea Only and Other (See Comments)    Visual Disturbance, Balance Difficulty, Dizzy "Room Spinning; "Swimming" Balance, vision, nausea, dizzy, "swimming", "room spinning" Balance, vision, nausea, dizzy, "swimming, "room spinning"   Quinine Derivatives Nausea Only    dizzy   Sulfa Antibiotics Nausea Only    Also complained of dizziness   Atorvastatin Other (See Comments)    Severe liver function problems   Ephedrine Other (See Comments)    shaking   Other Other (See Comments)    Quinine causes nausea, dizzy   Oxycodone Other (See Comments)    Balance, vision, nausea, dizzy, "swimming", "room spinning"   Cephalexin Rash   Epinephrine Other (See Comments)    Patient becomes "shaky" shaking shaking   Penicillins Rash    Pt reported all over body rash in 1958 Has patient had a PCN reaction causing immediate rash, facial/tongue/throat swelling, SOB or lightheadedness with hypotension:Yes Has patient had a PCN reaction causing severe rash involving mucus membranes or skin necrosis: [name] Has patient had a PCN reaction that required hospitalization: [name] Has patient had a PCN reaction occurring within the last 10 years:[name]     Theophyllines Other (See Comments)    Patient becomes "shaky"   Family History  Problem Relation Age of Onset   Heart disease Father    Cancer Father         intestinal polyps   Heart attack Father    Asthma Sister    Heart disease Brother    Heart attack Brother     Current Outpatient Medications (Endocrine & Metabolic):    Betamethasone Sodium Phosphate 6 MG/ML SOLN, INJECT 2:4 SYRINGE BILATERALLY INTO EACH KNEE JOINT   levothyroxine (SYNTHROID) 75 MCG tablet, TAKE 1 TABLET(75 MCG) BY MOUTH DAILY  Current Outpatient Medications (Cardiovascular):    EPINEPHrine 0.3 mg/0.3 mL IJ SOAJ injection, Inject into the muscle.   furosemide (LASIX) 40 MG tablet, Take 1 tablet (40 mg total) by mouth daily.   hydrochlorothiazide (HYDRODIURIL) 25 MG tablet, TAKE 2 TABLETS(50 MG) BY MOUTH DAILY   metoprolol succinate (TOPROL-XL) 25 MG 24 hr tablet, TAKE 1/2 TABLET(12.5 MG) BY MOUTH DAILY   REPATHA PUSHTRONEX SYSTEM 420 MG/3.5ML SOCT, INJECT 1 UNITS INTO THE SKIN EVERY 30 DAYS  Current Outpatient Medications (Respiratory):    albuterol (PROVENTIL) (2.5 MG/3ML) 0.083% nebulizer solution, Take 3 mLs (2.5 mg total) by nebulization every 6 (six) hours as needed for wheezing or shortness of breath.   albuterol (VENTOLIN HFA) 108 (90 Base) MCG/ACT inhaler, INHALE 2 PUFFS BY MOUTH INTO THE LUNGS IF NEEDED FOR WHEEZING AND OR SHORTNESS OF BREATH   ALLERGY RELIEF 180 MG tablet, TAKE 1 TABLET BY MOUTH EVERY DAY   azelastine (ASTELIN) 0.1 % nasal spray, Place 2 sprays into both nostrils daily. Use in each nostril as directed   Azelastine HCl 0.15 % SOLN, SMARTSIG:1 Spray(s) Both Nares Every Evening   fluticasone (FLONASE) 50 MCG/ACT nasal spray, Place 1 spray into both nostrils every morning.   levalbuterol (XOPENEX HFA) 45 MCG/ACT inhaler, Inhale 2 puffs into the lungs every 6 (six) hours as needed for wheezing or shortness of breath.   levocetirizine (XYZAL) 5 MG tablet, Take 1 tablet (5 mg total) by mouth every evening.   montelukast (SINGULAIR) 10 MG tablet, Take 1 tablet (10 mg total) by mouth at bedtime.   triamcinolone (NASACORT AQ) 55 MCG/ACT AERO nasal  inhaler, Place 2 sprays daily into the nose.  WIXELA INHUB 500-50 MCG/DOSE AEPB, Inhale 1 puff into the lungs 2 (two) times daily.  Current Outpatient Medications (Analgesics):    allopurinol (ZYLOPRIM) 300 MG tablet, Take 300 mg by mouth daily.   colchicine 0.6 MG tablet, Take 0.6 mg by mouth daily as needed (flare  up).    traMADol-acetaminophen (ULTRACET) 37.5-325 MG tablet, Take 1-2 tablets by mouth every 6 (six) hours as needed for moderate pain or severe pain.   Current Outpatient Medications (Hematological):    Cyanocobalamin (VITAMIN B-12 PO), Take 1 tablet by mouth daily.   XARELTO 20 MG TABS tablet, TAKE 1 TABLET(20 MG) BY MOUTH DAILY WITH SUPPER  Current Outpatient Medications (Other):    ALPRAZolam (XANAX) 0.5 MG tablet, TAKE 1 TO 2 TABLETS BY MOUTH TWICE DAILY AS NEEDED FOR ANXIETY OR SLEEP   azelastine (OPTIVAR) 0.05 % ophthalmic solution, Place 1 drop into both eyes 2 (two) times daily.   B Complex Vitamins (VITAMIN B COMPLEX PO), Take 1 tablet by mouth daily.   Biotin 5000 MCG CAPS, Take 5,000 mcg by mouth daily.    cholecalciferol (VITAMIN D) 1000 UNITS tablet, Take 1,000 Units by mouth daily.   co-enzyme Q-10 50 MG capsule, Take 400 mg by mouth daily.   cycloSPORINE (RESTASIS) 0.05 % ophthalmic emulsion, Apply to eye as needed.   diclofenac sodium (VOLTAREN) 1 % GEL, Apply 4 g topically 4 (four) times daily as needed.   EQL Natural Zinc 50 MG TABS, Take 50 mg by mouth daily.   escitalopram (LEXAPRO) 10 MG tablet, TAKE 1 TABLET(10 MG) BY MOUTH DAILY   gabapentin (NEURONTIN) 300 MG capsule, Take 1 capsule (300 mg total) by mouth 3 (three) times daily.   mupirocin ointment (BACTROBAN) 2 %, Use twice a day as directed   nystatin cream (MYCOSTATIN), Apply 1 application topically daily as needed for dry skin.    ondansetron (ZOFRAN) 4 MG tablet, Take 1 tablet (4 mg total) by mouth every 8 (eight) hours as needed for nausea or vomiting.   potassium chloride SA (KLOR-CON) 20 MEQ  tablet, Take 1 tablet (20 mEq total) by mouth daily.   Pyridoxine HCl (VITAMIN B-6 PO), Take 1 tablet by mouth daily.   Saccharomyces boulardii (FLORASTOR PO), Take 2 capsules by mouth daily.   tiZANidine (ZANAFLEX) 2 MG tablet, Take 1 tablet (2 mg total) by mouth at bedtime.   triamcinolone cream (KENALOG) 0.1 %, Apply 1 application topically 2 (two) times daily. For 2 weeks, then as needed   valACYclovir (VALTREX) 1000 MG tablet, TAKE 2 TABLETS BY MOUTH TWICE DAILY FOR 1 DAY-TAKE DOSES 12 HOURS APART   vitamin C (ASCORBIC ACID) 500 MG tablet, Take 500 mg by mouth daily.   VITAMIN E PO, Take 1 tablet by mouth daily.   Reviewed prior external information including notes and imaging from  primary care provider As well as notes that were available from care everywhere and other healthcare systems.  Past medical history, social, surgical and family history all reviewed in electronic medical record.  [name] pertanent information unless stated regarding to the chief complaint.   Review of Systems:  [name] headache, visual changes, nausea, vomiting, diarrhea, constipation, dizziness, abdominal pain, skin rash, fevers, chills, night sweats, weight loss, swollen lymph nodes, joint swelling, chest pain, shortness of breath, mood changes. POSITIVE muscle aches, body aches  Objective  Blood pressure 110/76, pulse 68, height $RemoveBe'5\' 6"'[id number]$  (1.676 m), weight 294 lb (133.4 kg), SpO2 97 %.   General: [name] apparent distress alert and oriented  x3 mood and affect normal, dressed appropriately.  Morbidly obese HEENT: Pupils equal, extraocular movements intact  Respiratory: Patient's speak in full sentences and does not appear short of breath  Cardiovascular: 2+ lower extremity edema, non tender, [name] erythema  Patient is in a wheelchair but is able to stand up out of the wheelchair. MSK: Foot exam shows the patient does have severe breakdown of the transverse arch bilaterally.  Significant overpronation of the hindfoot left  greater than right.  Patient is some callus formation over the proximal aspect of the fifth toe.  This is on the plantar aspect.  Patient does seem to be mostly neurovascularly intact.  Patient does have arthritic changes of multiple joints including her knees fairly significantly.  Still has some mild hypertonicity noted.   After verbal consent patient patient did have me take a 10 blade to the plantar aspect of the foot over the callus formation and did have debridement noted.  [name] blood loss.  Post debridement instructions given.   Impression and Recommendations:     The above documentation has been reviewed and is accurate and complete Lyndal Pulley, DO

## 2020-10-17 ENCOUNTER — Other Ambulatory Visit: Payer: Self-pay

## 2020-10-17 ENCOUNTER — Ambulatory Visit: Payer: Medicare Other | Admitting: Family Medicine

## 2020-10-17 ENCOUNTER — Other Ambulatory Visit (INDEPENDENT_AMBULATORY_CARE_PROVIDER_SITE_OTHER): Payer: Medicare Other

## 2020-10-17 ENCOUNTER — Encounter: Payer: Self-pay | Admitting: Family Medicine

## 2020-10-17 VITALS — BP 110/76 | HR 68 | Ht 66.0 in | Wt 294.0 lb

## 2020-10-17 DIAGNOSIS — M79672 Pain in left foot: Secondary | ICD-10-CM | POA: Diagnosis not present

## 2020-10-17 DIAGNOSIS — R739 Hyperglycemia, unspecified: Secondary | ICD-10-CM

## 2020-10-17 DIAGNOSIS — E538 Deficiency of other specified B group vitamins: Secondary | ICD-10-CM

## 2020-10-17 DIAGNOSIS — M2041 Other hammer toe(s) (acquired), right foot: Secondary | ICD-10-CM | POA: Diagnosis not present

## 2020-10-17 DIAGNOSIS — M79671 Pain in right foot: Secondary | ICD-10-CM

## 2020-10-17 DIAGNOSIS — E559 Vitamin D deficiency, unspecified: Secondary | ICD-10-CM

## 2020-10-17 DIAGNOSIS — M2042 Other hammer toe(s) (acquired), left foot: Secondary | ICD-10-CM

## 2020-10-17 DIAGNOSIS — G6289 Other specified polyneuropathies: Secondary | ICD-10-CM | POA: Diagnosis not present

## 2020-10-17 DIAGNOSIS — I10 Essential (primary) hypertension: Secondary | ICD-10-CM

## 2020-10-17 LAB — URINALYSIS, ROUTINE W REFLEX MICROSCOPIC
Bilirubin Urine: NEGATIVE
Ketones, ur: NEGATIVE
Nitrite: NEGATIVE
Specific Gravity, Urine: 1.025 (ref 1.000–1.030)
Urine Glucose: NEGATIVE
Urobilinogen, UA: 0.2 (ref 0.0–1.0)
pH: 6 (ref 5.0–8.0)

## 2020-10-17 LAB — LIPID PANEL
Cholesterol: 186 mg/dL (ref 0–200)
HDL: 53.5 mg/dL (ref >39.00)
LDL Cholesterol: 107 mg/dL — ABNORMAL HIGH (ref 0–99)
NonHDL: 132.6
Total CHOL/HDL Ratio: 3
Triglycerides: 130 mg/dL (ref 0.0–149.0)
VLDL: 26 mg/dL (ref 0.0–40.0)

## 2020-10-17 LAB — HEPATIC FUNCTION PANEL
ALT: 11 U/L (ref 0–35)
AST: 15 U/L (ref 0–37)
Albumin: 4.1 g/dL (ref 3.5–5.2)
Alkaline Phosphatase: 63 U/L (ref 39–117)
Bilirubin, Direct: 0.2 mg/dL (ref 0.0–0.3)
Total Bilirubin: 1.3 mg/dL — ABNORMAL HIGH (ref 0.2–1.2)
Total Protein: 6.8 g/dL (ref 6.0–8.3)

## 2020-10-17 LAB — CBC WITH DIFFERENTIAL/PLATELET
Basophils Absolute: 0 10*3/uL (ref 0.0–0.1)
Basophils Relative: 0.4 % (ref 0.0–3.0)
Eosinophils Absolute: 0.2 10*3/uL (ref 0.0–0.7)
Eosinophils Relative: 3 % (ref 0.0–5.0)
HCT: 39.8 % (ref 36.0–46.0)
Hemoglobin: 13.2 g/dL (ref 12.0–15.0)
Lymphocytes Relative: 19.5 % (ref 12.0–46.0)
Lymphs Abs: 1.4 10*3/uL (ref 0.7–4.0)
MCHC: 33 g/dL (ref 30.0–36.0)
MCV: 94 fl (ref 78.0–100.0)
Monocytes Absolute: 0.6 10*3/uL (ref 0.1–1.0)
Monocytes Relative: 8.7 % (ref 3.0–12.0)
Neutro Abs: 4.9 10*3/uL (ref 1.4–7.7)
Neutrophils Relative %: 68.4 % (ref 43.0–77.0)
Platelets: 159 10*3/uL (ref 150.0–400.0)
RBC: 4.23 Mil/uL (ref 3.87–5.11)
RDW: 16.5 % — ABNORMAL HIGH (ref 11.5–15.5)
WBC: 7.2 10*3/uL (ref 4.0–10.5)

## 2020-10-17 LAB — BASIC METABOLIC PANEL
BUN: 14 mg/dL (ref 6–23)
CO2: 29 mEq/L (ref 19–32)
Calcium: 9.3 mg/dL (ref 8.4–10.5)
Chloride: 104 mEq/L (ref 96–112)
Creatinine, Ser: 0.79 mg/dL (ref 0.40–1.20)
GFR: 69.31 mL/min (ref >60.00)
Glucose, Bld: 147 mg/dL — ABNORMAL HIGH (ref 70–99)
Potassium: 3.5 mEq/L (ref 3.5–5.1)
Sodium: 142 mEq/L (ref 135–145)

## 2020-10-17 LAB — VITAMIN D 25 HYDROXY (VIT D DEFICIENCY, FRACTURES): VITD: 41.15 ng/mL (ref 30.00–100.00)

## 2020-10-17 LAB — TSH: TSH: 1.81 u[IU]/mL (ref 0.35–4.50)

## 2020-10-17 LAB — VITAMIN B12: Vitamin B-12: 544 pg/mL (ref 211–911)

## 2020-10-17 LAB — HEMOGLOBIN A1C: Hgb A1c MFr Bld: 6.4 % (ref 4.6–6.5)

## 2020-10-17 NOTE — Patient Instructions (Signed)
Good to see you Custom orthotics I think they will help with balance Wart remover cream with Band-Aid daily for 1 week to soften callus  See me again in 7-8 weeks

## 2020-10-17 NOTE — Assessment & Plan Note (Signed)
Known peripheral neuropathy.  Could be contributing.  Patient did have a callus formation on the foot.  With patient's breakdown of the transverse and longitudinal arch I do think that custom orthotics could be beneficial as well.  We will have patient fitted for these.  This will allow her to switch to a different shoes accordingly.  Encouraged her to continue to try to stay active.  Patient will hold on any type of physical therapy at the moment.  Patient will follow up with me again in 2 months.

## 2020-10-17 NOTE — Assessment & Plan Note (Signed)
Encouraged continued attempts at weight loss.  May need to discuss the possibility of healthy weight and wellness.

## 2020-10-17 NOTE — Assessment & Plan Note (Signed)
Will be fitted for custom orthotics.

## 2020-10-19 ENCOUNTER — Encounter: Payer: Self-pay | Admitting: Family Medicine

## 2020-10-19 ENCOUNTER — Other Ambulatory Visit: Payer: Self-pay

## 2020-10-20 ENCOUNTER — Ambulatory Visit: Payer: Medicare Other | Admitting: Internal Medicine

## 2020-10-23 ENCOUNTER — Other Ambulatory Visit: Payer: Self-pay

## 2020-10-23 ENCOUNTER — Ambulatory Visit (INDEPENDENT_AMBULATORY_CARE_PROVIDER_SITE_OTHER): Payer: Medicare Other | Admitting: Internal Medicine

## 2020-10-23 VITALS — BP 124/82 | HR 69 | Ht 66.0 in

## 2020-10-23 DIAGNOSIS — N390 Urinary tract infection, site not specified: Secondary | ICD-10-CM | POA: Insufficient documentation

## 2020-10-23 DIAGNOSIS — J4531 Mild persistent asthma with (acute) exacerbation: Secondary | ICD-10-CM | POA: Diagnosis not present

## 2020-10-23 DIAGNOSIS — E782 Mixed hyperlipidemia: Secondary | ICD-10-CM | POA: Diagnosis not present

## 2020-10-23 DIAGNOSIS — I495 Sick sinus syndrome: Secondary | ICD-10-CM | POA: Insufficient documentation

## 2020-10-23 DIAGNOSIS — E039 Hypothyroidism, unspecified: Secondary | ICD-10-CM | POA: Diagnosis not present

## 2020-10-23 DIAGNOSIS — E538 Deficiency of other specified B group vitamins: Secondary | ICD-10-CM

## 2020-10-23 DIAGNOSIS — E559 Vitamin D deficiency, unspecified: Secondary | ICD-10-CM

## 2020-10-23 DIAGNOSIS — J45901 Unspecified asthma with (acute) exacerbation: Secondary | ICD-10-CM | POA: Insufficient documentation

## 2020-10-23 DIAGNOSIS — E7800 Pure hypercholesterolemia, unspecified: Secondary | ICD-10-CM | POA: Insufficient documentation

## 2020-10-23 DIAGNOSIS — I10 Essential (primary) hypertension: Secondary | ICD-10-CM | POA: Diagnosis not present

## 2020-10-23 DIAGNOSIS — I2510 Atherosclerotic heart disease of native coronary artery without angina pectoris: Secondary | ICD-10-CM | POA: Insufficient documentation

## 2020-10-23 DIAGNOSIS — D696 Thrombocytopenia, unspecified: Secondary | ICD-10-CM | POA: Insufficient documentation

## 2020-10-23 DIAGNOSIS — F411 Generalized anxiety disorder: Secondary | ICD-10-CM | POA: Insufficient documentation

## 2020-10-23 DIAGNOSIS — R739 Hyperglycemia, unspecified: Secondary | ICD-10-CM | POA: Diagnosis not present

## 2020-10-23 DIAGNOSIS — Z9989 Dependence on other enabling machines and devices: Secondary | ICD-10-CM | POA: Insufficient documentation

## 2020-10-23 DIAGNOSIS — G4733 Obstructive sleep apnea (adult) (pediatric): Secondary | ICD-10-CM | POA: Insufficient documentation

## 2020-10-23 DIAGNOSIS — Z0001 Encounter for general adult medical examination with abnormal findings: Secondary | ICD-10-CM

## 2020-10-23 MED ORDER — CIPROFLOXACIN HCL 500 MG PO TABS: 500.0000 mg | ORAL_TABLET | Freq: Two times a day (BID) | ORAL | 0 refills | Status: AC

## 2020-10-23 NOTE — Progress Notes (Signed)
Patient ID: [name], female   DOB: Mar 15, 1936, 85 y.o.   MRN: [id number]         Chief Complaint:: wellness exam and UTI, hld, asthma, htn, hypothyroidism       HPI:  [name] is a 85 y.o. female here for wellness exam; declines tdap, shingrix, covid booster #2, o/w up to date with preventive referrals and immunizations                        Also c/o 3 days dysuria with frequency and Denies urinary symptoms such as urgency, flank pain, hematuria or n/v, fever, chills. Also could not take statin daily , now on crestor mon and fri, and repatha with plan for f/u lipids 6 mo. Pt denies chest pain, increased sob or doe, wheezing, orthopnea, PND, increased LE swelling, palpitations, dizziness or syncope.   Pt denies polydipsia, polyuria, or new focal neuro s/s.   Pt denies fever, wt loss, night sweats, loss of appetite, or other constitutional symptoms  Denies hyper or hypo thyroid symptoms such as voice, skin or hair change.  Denies worsening depressive symptoms, suicidal ideation, or panic   Wt Readings from Last 3 Encounters:  10/17/20 294 lb (133.4 kg)  10/10/20 294 lb (133.4 kg)  09/05/20 294 lb (133.4 kg)   BP Readings from Last 3 Encounters:  10/23/20 124/82  10/17/20 110/76  10/10/20 (!) 152/87   Immunization History  Administered Date(s) Administered   Fluad Quad(high Dose 65+) 03/30/2019, 01/21/2020   Influenza Split 04/06/2009, 04/05/2010, 01/10/2011, 02/10/2012, 04/23/2013   Influenza, High Dose Seasonal PF 03/20/2017, 01/19/2018   Influenza-Unspecified 05/08/2015, 01/23/2016   PFIZER(Purple Top)SARS-COV-2 Vaccination 06/11/2019, 07/06/2019, 02/28/2020   Pneumococcal Conjugate-13 06/01/2013, 03/20/2017   Pneumococcal Polysaccharide-23 03/16/2003, 01/23/2016   Td 12/08/2009   There are no preventive care reminders to display for this patient.     Past Medical History:  Diagnosis Date   Allergy    Anxiety    Arthritis    no cartlidge in knees   Asthma     Bradycardia    a. atenolol stopped due to this, HR 40s.   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  CAD s/p RCA stent 2000 with residual mild-mod disease of left system 2001.   Chronic atrial fibrillation (HCC)    COPD (chronic obstructive pulmonary disease) ([place])    Essential hypertension 03/24/2015   Gout    post operative   Hx of radiation therapy 04/02/11 -05/20/11   left breast   Hyperlipidemia    Hypothyroidism 03/24/2015   Incontinence of urine    Malignant neoplasm of upper-outer quadrant of female breast ([place]) 12/26/2010   Osteoporosis 03/24/2015   Peripheral neuropathy 03/24/2015   Plantar fasciitis    Sleep apnea    Past Surgical History:  Procedure Laterality Date   BREAST LUMPECTOMY W/ NEEDLE LOCALIZATION  01/29/2011   Left with [facility] Dr Margot Chimes   CHOLECYSTECTOMY  1955   CORONARY ANGIOPLASTY WITH STENT PLACEMENT  2000   DILATION AND CURETTAGE OF UTERUS  1976   and 1996   [facility]   tendons between thumb and forefinger   SKIN BIOPSY     1994, 2006, 2008, 2010, 2011, 2011, 2012-  pre-cancerous   TONSILLECTOMY  1955    reports that she quit smoking about 57 years ago. Her smoking use included cigarettes. She has a  4.00 pack-year smoking history. She has never used smokeless tobacco. She reports that she does not drink alcohol and does not use drugs. family history includes Asthma in her sister; Cancer in her father; Heart attack in her brother and father; Heart disease in her brother and father. Allergies  Allergen Reactions   Crestor [Rosuvastatin Calcium]     Severe liver function problems   Hydrocodone-Acetaminophen Anxiety and Itching   Lidocaine Hcl Other (See Comments)    Novacaine:  Becomes very shaky   Lipitor [Atorvastatin Calcium] Other (See Comments)    Elevated liver function    Procaine Other (See Comments)    shaking   Rosuvastatin Other (See Comments)    Severe liver function problems    Theophylline Other (See Comments)    Becomes very shaky skaking skaking   Vicodin [Hydrocodone-Acetaminophen] Itching    Itching all over   Codeine Nausea Only, Anxiety and Other (See Comments)    Also complained of dizziness.  Has same problems with oxycodone and hydrocodone Visual Disturbance, Balance Difficulty, Dizzy "Room Spinning; "Swimming" Balance, vision, nausea, dizzy, "swimming", "room spinning" Balance, vision, nausea, dizzy, "swimming", "room spinning"   Oxycodone-Acetaminophen Nausea Only, Anxiety and Other (See Comments)    Visual Disturbance, Balance Difficulty, Dizzy "Room Spinning; "Swimming"   Propoxyphene Anxiety, Nausea Only and Other (See Comments)    Visual Disturbance, Balance Difficulty, Dizzy "Room Spinning; "Swimming" Balance, vision, nausea, dizzy, "swimming", "room spinning" Balance, vision, nausea, dizzy, "swimming, "room spinning"   Quinine Derivatives Nausea Only    dizzy   Sulfa Antibiotics Nausea Only    Also complained of dizziness   Atorvastatin Other (See Comments)    Severe liver function problems   Ephedrine Other (See Comments)    shaking   Other Other (See Comments)    Quinine causes nausea, dizzy   Oxycodone Other (See Comments)    Balance, vision, nausea, dizzy, "swimming", "room spinning"   Quinine Other (See Comments)   Cephalexin Rash and Other (See Comments)   Epinephrine Other (See Comments)    Patient becomes "shaky" shaking shaking   Penicillins Rash    Pt reported all over body rash in 1958 Has patient had a PCN reaction causing immediate rash, facial/tongue/throat swelling, SOB or lightheadedness with hypotension:Yes Has patient had a PCN reaction causing severe rash involving mucus membranes or skin necrosis: No Has patient had a PCN reaction that required hospitalization: No Has patient had a PCN reaction occurring within the last 10 years:No     Theophyllines Other (See Comments)    Patient becomes "shaky"   Current  Outpatient Medications on File Prior to Visit  Medication Sig Dispense Refill   albuterol (PROVENTIL) (2.5 MG/3ML) 0.083% nebulizer solution Take 3 mLs (2.5 mg total) by nebulization every 6 (six) hours as needed for wheezing or shortness of breath. 75 mL 12   albuterol (VENTOLIN HFA) 108 (90 Base) MCG/ACT inhaler INHALE 2 PUFFS BY MOUTH INTO THE LUNGS IF NEEDED FOR WHEEZING AND OR SHORTNESS OF BREATH 8.5 g 2   ALLERGY RELIEF 180 MG tablet TAKE 1 TABLET BY MOUTH EVERY DAY 90 tablet 1   allopurinol (ZYLOPRIM) 300 MG tablet Take 300 mg by mouth daily.  1   ALPRAZolam (XANAX) 0.5 MG tablet TAKE 1 TO 2 TABLETS BY MOUTH TWICE DAILY AS NEEDED FOR ANXIETY OR SLEEP 60 tablet 2   azelastine (ASTELIN) 0.1 % nasal spray Place 2 sprays into both nostrils daily. Use in each nostril as directed 30 mL 5  azelastine (OPTIVAR) 0.05 % ophthalmic solution Place 1 drop into both eyes 2 (two) times daily. 6 mL 12   Azelastine HCl 0.15 % SOLN SMARTSIG:1 Spray(s) Both Nares Every Evening     B Complex Vitamins (VITAMIN B COMPLEX PO) Take 1 tablet by mouth daily.     Betamethasone Sodium Phosphate 6 MG/ML SOLN INJECT 2:4 SYRINGE BILATERALLY INTO EACH KNEE JOINT     Biotin 5000 MCG CAPS Take 5,000 mcg by mouth daily.      cholecalciferol (VITAMIN D) 1000 UNITS tablet Take 1,000 Units by mouth daily.     co-enzyme Q-10 50 MG capsule Take 400 mg by mouth daily.     colchicine 0.6 MG tablet Take 0.6 mg by mouth daily as needed (flare  up).      Cyanocobalamin (VITAMIN B-12 PO) Take 1 tablet by mouth daily.     cycloSPORINE (RESTASIS) 0.05 % ophthalmic emulsion Apply to eye as needed.     diclofenac sodium (VOLTAREN) 1 % GEL Apply 4 g topically 4 (four) times daily as needed. 200 g 11   EPINEPHrine 0.3 mg/0.3 mL IJ SOAJ injection Inject into the muscle.     EQL Natural Zinc 50 MG TABS Take 50 mg by mouth daily.     escitalopram (LEXAPRO) 10 MG tablet TAKE 1 TABLET(10 MG) BY MOUTH DAILY 90 tablet 1   fluticasone (FLONASE)  50 MCG/ACT nasal spray Place 1 spray into both nostrils every morning.     furosemide (LASIX) 40 MG tablet Take 1 tablet (40 mg total) by mouth daily. 90 tablet 3   gabapentin (NEURONTIN) 300 MG capsule Take 1 capsule (300 mg total) by mouth 3 (three) times daily. 270 capsule 1   hydrochlorothiazide (HYDRODIURIL) 25 MG tablet TAKE 2 TABLETS(50 MG) BY MOUTH DAILY 180 tablet 1   levalbuterol (XOPENEX HFA) 45 MCG/ACT inhaler Inhale 2 puffs into the lungs every 6 (six) hours as needed for wheezing or shortness of breath. 3 Inhaler 3   levocetirizine (XYZAL) 5 MG tablet Take 1 tablet (5 mg total) by mouth every evening. 30 tablet 11   levothyroxine (SYNTHROID) 75 MCG tablet TAKE 1 TABLET(75 MCG) BY MOUTH DAILY 90 tablet 1   metoprolol succinate (TOPROL-XL) 25 MG 24 hr tablet TAKE 1/2 TABLET(12.5 MG) BY MOUTH DAILY 45 tablet 3   montelukast (SINGULAIR) 10 MG tablet Take 1 tablet (10 mg total) by mouth at bedtime. 90 tablet 1   mupirocin ointment (BACTROBAN) 2 % Use twice a day as directed 30 g 1   nystatin cream (MYCOSTATIN) Apply 1 application topically daily as needed for dry skin.   0   ondansetron (ZOFRAN) 4 MG tablet Take 1 tablet (4 mg total) by mouth every 8 (eight) hours as needed for nausea or vomiting. 40 tablet 3   potassium chloride SA (KLOR-CON) 20 MEQ tablet Take 1 tablet (20 mEq total) by mouth daily. 90 tablet 3   Pyridoxine HCl (VITAMIN B-6 PO) Take 1 tablet by mouth daily.     [place] 420 MG/3.5ML SOCT INJECT 1 UNITS INTO THE SKIN EVERY 30 DAYS 3.5 mL 11   rosuvastatin (CRESTOR) 20 MG tablet Take 20 mg by mouth at bedtime.     Saccharomyces boulardii (FLORASTOR PO) Take 2 capsules by mouth daily.     tiZANidine (ZANAFLEX) 2 MG tablet Take 1 tablet (2 mg total) by mouth at bedtime. 90 tablet 1   traMADol-acetaminophen (ULTRACET) 37.5-325 MG tablet Take 1-2 tablets by mouth every 6 (six) hours  as needed for moderate pain or severe pain.   0   triamcinolone (NASACORT AQ)  55 MCG/ACT AERO nasal inhaler Place 2 sprays daily into the nose. 1 Inhaler 12   triamcinolone cream (KENALOG) 0.1 % Apply 1 application topically 2 (two) times daily. For 2 weeks, then as needed 80 g 2   valACYclovir (VALTREX) 1000 MG tablet TAKE 2 TABLETS BY MOUTH TWICE DAILY FOR 1 DAY-TAKE DOSES 12 HOURS APART     vitamin C (ASCORBIC ACID) 500 MG tablet Take 500 mg by mouth daily.     VITAMIN E PO Take 1 tablet by mouth daily.     WIXELA INHUB 500-50 MCG/DOSE AEPB Inhale 1 puff into the lungs 2 (two) times daily. 60 each 11   XARELTO 20 MG TABS tablet TAKE 1 TABLET(20 MG) BY MOUTH DAILY WITH SUPPER 90 tablet 2   No current facility-administered medications on file prior to visit.        ROS:  All others reviewed and negative.  Objective        PE:  BP 124/82 (BP Location: Left Arm, Patient Position: Sitting, Cuff Size: Large)   Pulse 69   Ht 5' 6" (1.676 m)   SpO2 96%   BMI 47.45 kg/m                 Constitutional: Pt appears in NAD               HENT: Head: NCAT.                Right Ear: External ear normal.                 Left Ear: External ear normal.                Eyes: . Pupils are equal, round, and reactive to light. Conjunctivae and EOM are normal               Nose: without d/c or deformity               Neck: Neck supple. Gross normal ROM               Cardiovascular: Normal rate and regular rhythm.                 Pulmonary/Chest: Effort normal and breath sounds without rales or wheezing.                Abd:  Soft, NT, ND, + BS, no organomegaly               Neurological: Pt is alert. At baseline orientation, motor grossly intact               Skin: Skin is warm. No rashes, no other new lesions, LE edema - trace bilteral               Psychiatric: Pt behavior is normal without agitation   Micro: none  Cardiac tracings I have personally interpreted today:  none  Pertinent Radiological findings (summarize): none   Lab Results  Component Value Date   WBC 7.2  10/17/2020   HGB 13.2 10/17/2020   HCT 39.8 10/17/2020   PLT 159.0 10/17/2020   GLUCOSE 147 (H) 10/17/2020   CHOL 186 10/17/2020   TRIG 130.0 10/17/2020   HDL 53.50 10/17/2020   LDLDIRECT 107.0 09/17/2016   LDLCALC 107 (H) 10/17/2020   ALT 11 10/17/2020   AST 15 10/17/2020  NA 142 10/17/2020   K 3.5 10/17/2020   CL 104 10/17/2020   CREATININE 0.79 10/17/2020   BUN 14 10/17/2020   CO2 29 10/17/2020   TSH 1.81 10/17/2020   HGBA1C 6.4 10/17/2020   Assessment/Plan:  [name] is a 85 y.o. 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  Encounter for well adult exam with abnormal findings Age and sex appropriate education and counseling updated with regular exercise and diet Referrals for preventative services - none needed Immunizations addressed - decline tdap, shingrix, covid booster Smoking counseling  - none needed Evidence for depression or other mood disorder - none significant Most recent labs reviewed. I have personally reviewed and have noted: 1) the patient's medical and social history 2) The patient's current medications and supplements 3) The patient's height, weight, and BMI have been recorded in the chart   Essential hypertension BP Readings from Last 3 Encounters:  10/23/20 124/82  10/17/20 110/76  10/10/20 (!) 152/87   Stable, pt to continue medical treatment hct, toprol   Hyperglycemia Lab Results   Component Value Date   HGBA1C 6.4 10/17/2020   Stable, pt to continue current medical treatment  - diet   Hyperlipidemia Lab Results  Component Value Date   LDLCALC 107 (H) 10/17/2020   Unocntrolled, pt to continue current statin crestor mon and fri, and repatha, f/u lipid clinic   Hypothyroidism Lab Results  Component Value Date   TSH 1.81 10/17/2020   Stable, pt to continue levothyroxine   Urinary tract infectious disease Mild to mod, for antibx course,  to f/u any worsening symptoms or concerns  Exacerbation of asthma Much improved recently with simply staying indoors last 2 months, cont same tx - singulair, wixhela  Followup: Return in about 6 months (around 04/24/2021).  Cathlean Cower, MD 10/25/2020 8:28 PM [facility]

## 2020-10-23 NOTE — Patient Instructions (Signed)
Please take all new medication as prescribed - the cipro antibiotic  Please continue all other medications as before, and refills have been done if requested.  Please have the pharmacy call with any other refills you may need.  Please continue your efforts at being more active, low cholesterol diet, and weight control.  You are otherwise up to date with prevention measures today.  Please keep your appointments with your specialists as you may have planned  Please make an Appointment to return in 6 months, or sooner if needed, also with Lab Appointment for testing done 3-5 days before at the [facility] (so this is for TWO appointments - please see the scheduling desk as you leave)  Due to the ongoing Covid 19 pandemic, our lab now requires an appointment for any labs done at our office.  If you need labs done and do not have an appointment, please call our office ahead of time to schedule before presenting to the lab for your testing.

## 2020-10-25 ENCOUNTER — Encounter: Payer: Self-pay | Admitting: Internal Medicine

## 2020-10-25 NOTE — Assessment & Plan Note (Signed)
Lab Results  Component Value Date   TSH 1.81 10/17/2020   Stable, pt to continue levothyroxine

## 2020-10-25 NOTE — Assessment & Plan Note (Signed)
Much improved recently with simply staying indoors last 2 months, cont same tx - singulair, wixhela

## 2020-10-25 NOTE — Assessment & Plan Note (Signed)
Mild to mod, for antibx course,  to f/u any worsening symptoms or concerns 

## 2020-10-25 NOTE — Assessment & Plan Note (Signed)
Age and sex appropriate education and counseling updated with regular exercise and diet Referrals for preventative services - none needed Immunizations addressed - decline tdap, shingrix, covid booster Smoking counseling  - none needed Evidence for depression or other mood disorder - none significant Most recent labs reviewed. I have personally reviewed and have noted: 1) the patient's medical and social history 2) The patient's current medications and supplements 3) The patient's height, weight, and BMI have been recorded in the chart

## 2020-10-25 NOTE — Assessment & Plan Note (Signed)
Lab Results  Component Value Date   HGBA1C 6.4 10/17/2020   Stable, pt to continue current medical treatment  - diet

## 2020-10-25 NOTE — Assessment & Plan Note (Addendum)
Lab Results  Component Value Date   LDLCALC 107 (H) 10/17/2020   Unocntrolled, pt to continue current statin crestor mon and fri, and repatha, f/u lipid clinic

## 2020-10-25 NOTE — Assessment & Plan Note (Signed)
BP Readings from Last 3 Encounters:  10/23/20 124/82  10/17/20 110/76  10/10/20 (!) 152/87   Stable, pt to continue medical treatment hct, toprol

## 2020-11-03 ENCOUNTER — Other Ambulatory Visit: Payer: Self-pay | Admitting: Interventional Cardiology

## 2020-11-03 NOTE — Telephone Encounter (Signed)
Prescription refill request for Xarelto received.  Indication:afib Last office visit:06/27/20 Weight: 133 kg Age:85 Scr:0.79 CrCl:113 ml/min

## 2020-11-13 DIAGNOSIS — L570 Actinic keratosis: Secondary | ICD-10-CM | POA: Diagnosis not present

## 2020-11-13 DIAGNOSIS — L718 Other rosacea: Secondary | ICD-10-CM | POA: Diagnosis not present

## 2020-11-20 ENCOUNTER — Other Ambulatory Visit: Payer: Self-pay | Admitting: Internal Medicine

## 2020-11-21 ENCOUNTER — Encounter: Payer: Self-pay | Admitting: Internal Medicine

## 2020-11-21 MED ORDER — NIRMATRELVIR/RITONAVIR (PAXLOVID)TABLET: 3.0000 | ORAL_TABLET | Freq: Two times a day (BID) | ORAL | 0 refills | Status: AC

## 2020-11-21 MED ORDER — GUAIFENESIN-DM 100-10 MG/5ML PO SYRP: 5.0000 mL | ORAL_SOLUTION | Freq: Four times a day (QID) | ORAL | 0 refills | Status: DC | PRN

## 2020-11-27 DIAGNOSIS — M1711 Unilateral primary osteoarthritis, right knee: Secondary | ICD-10-CM | POA: Diagnosis not present

## 2020-11-27 DIAGNOSIS — M1712 Unilateral primary osteoarthritis, left knee: Secondary | ICD-10-CM | POA: Diagnosis not present

## 2020-11-30 NOTE — Progress Notes (Signed)
[facility] [place] Phone: [phone] Subjective:   [name], am serving as a scribe for Dr. Hulan Saas. This visit occurred during the SARS-CoV-2 public health emergency.  Safety protocols were in place, including screening questions prior to the visit, additional usage of staff PPE, and extensive cleaning of exam room while observing appropriate contact time as indicated for disinfecting solutions.    I'm seeing this patient by the request  of:  Biagio Borg, MD  CC: Bilateral foot and lower leg pain  VQM:[id number]  10/17/2020 Will be fitted for custom orthotics.  Encouraged continued attempts at weight loss.  May need to discuss the possibility of healthy weight and wellness.  Known peripheral neuropathy.  Could be contributing.  Patient did have a callus formation on the foot.  With patient's breakdown of the transverse and longitudinal arch I do think that custom orthotics could be beneficial as well.  We will have patient fitted for these.  This will allow her to switch to a different shoes accordingly.  Encouraged her to continue to try to stay active.  Patient will hold on any type of physical therapy at the moment.  Patient will follow up with me again in 2 months.  Update 12/05/2020 [name] is a 85 y.o. female coming in with complaint of foot pain. Patient states that her feet were doing better post Knee injection but are starting to tighten up. Patient fears falling if she is not using walker at home due to foot stiffness. Using tart cherry extract.   Has been using wart remover cream on foot and this is helping.   Received B knee injections 2 weeks ago from Dr. Rhona Raider.   Also having L sided cervical spine spasm.   Also complains of pocket of swelling over L patella.   Patient was to be referred for bilateral foot pain forCustom orthotics  Past Medical History:  Diagnosis Date   Allergy     Anxiety    Arthritis    [name] cartlidge in knees   Asthma    Bradycardia    a. atenolol stopped due to this, HR 40s.   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  CAD s/p RCA stent 2000 with residual mild-mod disease of left system 2001.   Chronic atrial fibrillation (HCC)    COPD (chronic obstructive pulmonary disease) ([place])    Essential hypertension 03/24/2015   Gout    post operative   Hx of radiation therapy 04/02/11 -05/20/11   left breast   Hyperlipidemia    Hypothyroidism 03/24/2015   Incontinence of urine    Malignant neoplasm of upper-outer quadrant of female breast ([place]) 12/26/2010   Osteoporosis 03/24/2015   Peripheral neuropathy 03/24/2015   Plantar fasciitis    Sleep apnea    Past Surgical History:  Procedure Laterality Date   BREAST LUMPECTOMY W/ NEEDLE LOCALIZATION  01/29/2011   Left with [facility] Dr Margot Chimes   CHOLECYSTECTOMY  1955   CORONARY ANGIOPLASTY WITH STENT PLACEMENT  2000   DILATION AND CURETTAGE OF UTERUS  1976   and 1996   [place]   tendons between thumb and forefinger   SKIN BIOPSY     1994, 2006, 2008, 2010, 2011, 2011, 2012-  pre-cancerous   TONSILLECTOMY  1955   Social History   Socioeconomic History   Marital status: Married  Spouse name: Not on file   Number of children: Not on file   Years of education: Not on file   Highest education level: Not on file  Occupational History   Not on file  Tobacco Use   Smoking status: Former    Packs/day: 1.00    Years: 4.00    Pack years: 4.00    Types: Cigarettes    Quit date: 12/05/1962    Years since quitting: 58.0   Smokeless tobacco: Never  Vaping Use   Vaping Use: Never used  Substance and Sexual Activity   Alcohol use: [name]   Drug use: [name]   Sexual activity: Not on file  Other Topics Concern   Not on file  Social History Narrative   Right handed   Social Determinants of Health   Financial Resource Strain: Low Risk    Difficulty of  Paying Living Expenses: Not hard at all  Food Insecurity: [name] Food Insecurity   Worried About Charity fundraiser in the Last Year: Never true   [place] in the Last Year: Never true  Transportation Needs: [name] Transportation Needs   Lack of Transportation (Medical): [name]   Lack of Transportation (Non-Medical): [name]  Physical Activity: Inactive   Days of Exercise per Week: 0 days   Minutes of Exercise per Session: 0 min  Stress: [name] Stress Concern Present   Feeling of Stress : Not at all  Social Connections: Socially Integrated   Frequency of Communication with Friends and Family: More than three times a week   Frequency of Social Gatherings with Friends and Family: More than three times a week   Attends Religious Services: More than 4 times per year   Active Member of [employer] or Organizations: Yes   Attends Music therapist: More than 4 times per year   Marital Status: Married   Allergies  Allergen Reactions   Crestor [Rosuvastatin Calcium]     Severe liver function problems   Hydrocodone-Acetaminophen Anxiety and Itching   Lidocaine Hcl Other (See Comments)    Novacaine:  Becomes very shaky   Lipitor [Atorvastatin Calcium] Other (See Comments)    Elevated liver function    Procaine Other (See Comments)    shaking   Rosuvastatin Other (See Comments)    Severe liver function problems   Theophylline Other (See Comments)    Becomes very shaky skaking skaking   Vicodin [Hydrocodone-Acetaminophen] Itching    Itching all over   Codeine Nausea Only, Anxiety and Other (See Comments)    Also complained of dizziness.  Has same problems with oxycodone and hydrocodone Visual Disturbance, Balance Difficulty, Dizzy "Room Spinning; "Swimming" Balance, vision, nausea, dizzy, "swimming", "room spinning" Balance, vision, nausea, dizzy, "swimming", "room spinning"   Oxycodone-Acetaminophen Nausea Only, Anxiety and Other (See Comments)    Visual Disturbance, Balance Difficulty,  Dizzy "Room Spinning; "Swimming"   Propoxyphene Anxiety, Nausea Only and Other (See Comments)    Visual Disturbance, Balance Difficulty, Dizzy "Room Spinning; "Swimming" Balance, vision, nausea, dizzy, "swimming", "room spinning" Balance, vision, nausea, dizzy, "swimming, "room spinning"   Quinine Derivatives Nausea Only    dizzy   Sulfa Antibiotics Nausea Only    Also complained of dizziness   Atorvastatin Other (See Comments)    Severe liver function problems   Ephedrine Other (See Comments)    shaking   Other Other (See Comments)    Quinine causes nausea, dizzy   Oxycodone Other (See Comments)    Balance, vision, nausea, dizzy, "  swimming", "room spinning"   Quinine Other (See Comments)   Cephalexin Rash and Other (See Comments)   Epinephrine Other (See Comments)    Patient becomes "shaky" shaking shaking   Penicillins Rash    Pt reported all over body rash in 1958 Has patient had a PCN reaction causing immediate rash, facial/tongue/throat swelling, SOB or lightheadedness with hypotension:Yes Has patient had a PCN reaction causing severe rash involving mucus membranes or skin necrosis: [name] Has patient had a PCN reaction that required hospitalization: [name] Has patient had a PCN reaction occurring within the last 10 years:[name]     Theophyllines Other (See Comments)    Patient becomes "shaky"   Family History  Problem Relation Age of Onset   Heart disease Father    Cancer Father        intestinal polyps   Heart attack Father    Asthma Sister    Heart disease Brother    Heart attack Brother     Current Outpatient Medications (Endocrine & Metabolic):    Betamethasone Sodium Phosphate 6 MG/ML SOLN, INJECT 2:4 SYRINGE BILATERALLY INTO EACH KNEE JOINT   levothyroxine (SYNTHROID) 75 MCG tablet, TAKE 1 TABLET(75 MCG) BY MOUTH DAILY  Current Outpatient Medications (Cardiovascular):    EPINEPHrine 0.3 mg/0.3 mL IJ SOAJ injection, Inject into the muscle.   furosemide (LASIX) 40 MG  tablet, Take 1 tablet (40 mg total) by mouth daily.   hydrochlorothiazide (HYDRODIURIL) 25 MG tablet, TAKE 2 TABLETS(50 MG) BY MOUTH DAILY   metoprolol succinate (TOPROL-XL) 25 MG 24 hr tablet, TAKE 1/2 TABLET(12.5 MG) BY MOUTH DAILY   REPATHA PUSHTRONEX SYSTEM 420 MG/3.5ML SOCT, INJECT 1 UNITS INTO THE SKIN EVERY 30 DAYS   rosuvastatin (CRESTOR) 20 MG tablet, Take 20 mg by mouth at bedtime.  Current Outpatient Medications (Respiratory):    albuterol (PROVENTIL) (2.5 MG/3ML) 0.083% nebulizer solution, Take 3 mLs (2.5 mg total) by nebulization every 6 (six) hours as needed for wheezing or shortness of breath.   albuterol (VENTOLIN HFA) 108 (90 Base) MCG/ACT inhaler, INHALE 2 PUFFS BY MOUTH INTO THE LUNGS IF NEEDED FOR WHEEZING AND OR SHORTNESS OF BREATH   ALLERGY RELIEF 180 MG tablet, TAKE 1 TABLET BY MOUTH EVERY DAY   azelastine (ASTELIN) 0.1 % nasal spray, Place 2 sprays into both nostrils daily. Use in each nostril as directed   Azelastine HCl 0.15 % SOLN, SMARTSIG:1 Spray(s) Both Nares Every Evening   fluticasone (FLONASE) 50 MCG/ACT nasal spray, Place 1 spray into both nostrils every morning.   guaiFENesin-dextromethorphan (ROBITUSSIN DM) 100-10 MG/5ML syrup, Take 5 mLs by mouth every 6 (six) hours as needed for cough.   levalbuterol (XOPENEX HFA) 45 MCG/ACT inhaler, Inhale 2 puffs into the lungs every 6 (six) hours as needed for wheezing or shortness of breath.   levocetirizine (XYZAL) 5 MG tablet, Take 1 tablet (5 mg total) by mouth every evening.   montelukast (SINGULAIR) 10 MG tablet, Take 1 tablet (10 mg total) by mouth at bedtime.   triamcinolone (NASACORT AQ) 55 MCG/ACT AERO nasal inhaler, Place 2 sprays daily into the nose.   WIXELA INHUB 500-50 MCG/DOSE AEPB, Inhale 1 puff into the lungs 2 (two) times daily.  Current Outpatient Medications (Analgesics):    allopurinol (ZYLOPRIM) 300 MG tablet, Take 300 mg by mouth daily.   colchicine 0.6 MG tablet, Take 0.6 mg by mouth daily as  needed (flare  up).    traMADol-acetaminophen (ULTRACET) 37.5-325 MG tablet, Take 1-2 tablets by mouth every 6 (six) hours as  needed for moderate pain or severe pain.   Current Outpatient Medications (Hematological):    Cyanocobalamin (VITAMIN B-12 PO), Take 1 tablet by mouth daily.   rivaroxaban (XARELTO) 20 MG TABS tablet, TAKE 1 TABLET(20 MG) BY MOUTH DAILY WITH SUPPER  Current Outpatient Medications (Other):    ALPRAZolam (XANAX) 0.5 MG tablet, TAKE 1 TO 2 TABLETS BY MOUTH TWICE DAILY AS NEEDED FOR ANXIETY OR SLEEP   azelastine (OPTIVAR) 0.05 % ophthalmic solution, Place 1 drop into both eyes 2 (two) times daily.   B Complex Vitamins (VITAMIN B COMPLEX PO), Take 1 tablet by mouth daily.   Biotin 5000 MCG CAPS, Take 5,000 mcg by mouth daily.    cholecalciferol (VITAMIN D) 1000 UNITS tablet, Take 1,000 Units by mouth daily.   co-enzyme Q-10 50 MG capsule, Take 400 mg by mouth daily.   cycloSPORINE (RESTASIS) 0.05 % ophthalmic emulsion, Apply to eye as needed.   diclofenac sodium (VOLTAREN) 1 % GEL, Apply 4 g topically 4 (four) times daily as needed.   EQL Natural Zinc 50 MG TABS, Take 50 mg by mouth daily.   escitalopram (LEXAPRO) 10 MG tablet, TAKE 1 TABLET(10 MG) BY MOUTH DAILY   gabapentin (NEURONTIN) 300 MG capsule, Take 1 capsule (300 mg total) by mouth 3 (three) times daily.   mupirocin ointment (BACTROBAN) 2 %, Use twice a day as directed   nystatin cream (MYCOSTATIN), Apply 1 application topically daily as needed for dry skin.    ondansetron (ZOFRAN) 4 MG tablet, Take 1 tablet (4 mg total) by mouth every 8 (eight) hours as needed for nausea or vomiting.   potassium chloride SA (KLOR-CON) 20 MEQ tablet, Take 1 tablet (20 mEq total) by mouth daily.   Pyridoxine HCl (VITAMIN B-6 PO), Take 1 tablet by mouth daily.   Saccharomyces boulardii (FLORASTOR PO), Take 2 capsules by mouth daily.   tiZANidine (ZANAFLEX) 2 MG tablet, Take 1 tablet (2 mg total) by mouth at bedtime.    triamcinolone ointment (KENALOG) 0.5 %, Apply 1 application topically every other day as needed.   valACYclovir (VALTREX) 1000 MG tablet, TAKE 2 TABLETS BY MOUTH TWICE DAILY FOR 1 DAY-TAKE DOSES 12 HOURS APART   vitamin C (ASCORBIC ACID) 500 MG tablet, Take 500 mg by mouth daily.   VITAMIN E PO, Take 1 tablet by mouth daily.   Reviewed prior external information including notes and imaging from  primary care provider As well as notes that were available from care everywhere and other healthcare systems.  Past medical history, social, surgical and family history all reviewed in electronic medical record.  [name] pertanent information unless stated regarding to the chief complaint.   Review of Systems:  [name] headache, visual changes, nausea, vomiting, diarrhea, constipation, dizziness, abdominal pain, skin rash, fevers, chills, night sweats, weight loss, swollen lymph nodes,  joint swelling, chest pain, shortness of breath, mood changes. POSITIVE muscle aches, body aches  Objective  Blood pressure 128/88, pulse 82, height 5' 6" (1.676 m), SpO2 94 %.   General: [name] apparent distress alert and oriented x3 mood and affect normal, dressed appropriately.  Morbidly obese HEENT: Pupils equal, extraocular movements intact  Respiratory: Patient's speak in full sentences and does not appear short of breath  Cardiovascular: Mild hemosiderin deposits noted.  Patient does have 2+ pitting edema to mid calf bilaterally. Patient is sitting in a wheelchair MSK:   Knee exam bilaterally shows the patient does have arthritic changes noted.  [name] instability with varus force.  Trace effusion noted  bilaterally. Foot exam still shows the patient does have hammertoes noted.  Significant breakdown of the transverse arch.  Peripheral neuropathy noted.  \Neck exam does show the patient does have some mild loss of lordosis.  Likely lipoma noted on the left side of the neck in the trapezius area.  Freely movable and tender.   Patient has crepitus noted with sidebending.  Limited sidebending to the left and rotation to the right by 5 to 10 degrees.  Significant tightness noted in the left trapezius   Impression and Recommendations:    The above documentation has been reviewed and is accurate and complete Lyndal Pulley, DO

## 2020-12-05 ENCOUNTER — Ambulatory Visit: Payer: Medicare Other | Admitting: Family Medicine

## 2020-12-05 ENCOUNTER — Other Ambulatory Visit: Payer: Self-pay

## 2020-12-05 ENCOUNTER — Encounter: Payer: Self-pay | Admitting: Family Medicine

## 2020-12-05 VITALS — BP 128/88 | HR 82 | Ht 66.0 in

## 2020-12-05 DIAGNOSIS — M25562 Pain in left knee: Secondary | ICD-10-CM

## 2020-12-05 DIAGNOSIS — L02416 Cutaneous abscess of left lower limb: Secondary | ICD-10-CM | POA: Diagnosis not present

## 2020-12-05 DIAGNOSIS — G8929 Other chronic pain: Secondary | ICD-10-CM | POA: Diagnosis not present

## 2020-12-05 DIAGNOSIS — M2042 Other hammer toe(s) (acquired), left foot: Secondary | ICD-10-CM | POA: Diagnosis not present

## 2020-12-05 DIAGNOSIS — S134XXD Sprain of ligaments of cervical spine, subsequent encounter: Secondary | ICD-10-CM

## 2020-12-05 DIAGNOSIS — M170 Bilateral primary osteoarthritis of knee: Secondary | ICD-10-CM

## 2020-12-05 DIAGNOSIS — M2041 Other hammer toe(s) (acquired), right foot: Secondary | ICD-10-CM

## 2020-12-05 MED ORDER — TRIAMCINOLONE ACETONIDE 0.1 % EX CREA: TOPICAL_CREAM | CUTANEOUS | Status: DC | PRN

## 2020-12-05 MED ORDER — TRIAMCINOLONE ACETONIDE 0.5 % EX OINT: 1.0000 "application " | TOPICAL_OINTMENT | CUTANEOUS | 0 refills | Status: DC | PRN

## 2020-12-05 NOTE — Assessment & Plan Note (Signed)
Patient does have tightness of the neck noted again.  Discussed with patient to take the Zanaflex nightly for the next 3 nights to avoid this.  Has only been going on for a day.  Patient does have what appears to be a potential lipoma in the area.

## 2020-12-05 NOTE — Assessment & Plan Note (Signed)
Significant arthritic changes of the knees bilaterally. Of the provider and given steroid injections previously.  Would consider the possibility of viscosupplementation.  Patient will get approval patient is not a candidate for surgical intervention we will see patient back again 4 weeks

## 2020-12-05 NOTE — Assessment & Plan Note (Signed)
Completely resolved at this time on the left leg.  Mild hemosiderin deposits still noted from the swelling.

## 2020-12-05 NOTE — Patient Instructions (Addendum)
Good to see you  Triamcinolone cream-apply as directed on ankles every other day We will try and get Gel injection's authorized  Orthotics number for [facility] [phone] See me again in 4 weeks

## 2020-12-05 NOTE — Assessment & Plan Note (Signed)
Patient has been referred for prophylactic Rx nothing will be beneficial for her.  Patient given the number to call at this time with patient not receiving a call previously she states.

## 2020-12-18 DIAGNOSIS — J4540 Moderate persistent asthma, uncomplicated: Secondary | ICD-10-CM | POA: Diagnosis not present

## 2020-12-18 DIAGNOSIS — J3089 Other allergic rhinitis: Secondary | ICD-10-CM | POA: Diagnosis not present

## 2020-12-18 DIAGNOSIS — J301 Allergic rhinitis due to pollen: Secondary | ICD-10-CM | POA: Diagnosis not present

## 2020-12-18 DIAGNOSIS — T63461D Toxic effect of venom of wasps, accidental (unintentional), subsequent encounter: Secondary | ICD-10-CM | POA: Diagnosis not present

## 2020-12-21 ENCOUNTER — Telehealth: Payer: Self-pay | Admitting: Lab

## 2020-12-21 NOTE — Progress Notes (Signed)
  Chronic Care Management   Outreach Note  12/21/2020 Name: [name] MRN: [id number] DOB: 1935/10/12  Referred by: Biagio Borg, MD Reason for referral : Medication Management   An unsuccessful telephone outreach was attempted today. The patient was referred to the pharmacist for assistance with care management and care coordination.   Follow Up Plan:   [place]

## 2021-01-02 ENCOUNTER — Ambulatory Visit: Payer: Medicare Other | Admitting: Family Medicine

## 2021-01-10 ENCOUNTER — Encounter: Payer: Self-pay | Admitting: Family Medicine

## 2021-01-10 ENCOUNTER — Ambulatory Visit (INDEPENDENT_AMBULATORY_CARE_PROVIDER_SITE_OTHER): Payer: Medicare Other | Admitting: Family Medicine

## 2021-01-10 ENCOUNTER — Ambulatory Visit: Payer: Medicare Other | Admitting: Family Medicine

## 2021-01-10 ENCOUNTER — Other Ambulatory Visit: Payer: Self-pay

## 2021-01-10 DIAGNOSIS — M2041 Other hammer toe(s) (acquired), right foot: Secondary | ICD-10-CM | POA: Diagnosis not present

## 2021-01-10 DIAGNOSIS — M2042 Other hammer toe(s) (acquired), left foot: Secondary | ICD-10-CM | POA: Diagnosis not present

## 2021-01-10 NOTE — Assessment & Plan Note (Signed)
Has balance issues and changes within the structure of each foot. -Counseled on home exercise therapy and supportive care. -Orthotics today.  Placed additional forefoot padding.  May need to consider metatarsal padding

## 2021-01-10 NOTE — Progress Notes (Signed)
[name] - 85 y.o. female MRN [id number]  Date of birth: [birth year]  SUBJECTIVE:  Including CC & ROS.  No chief complaint on file.   [name] is a 85 y.o. female that is presenting with knee pain and balance issues.  She has had trouble walking and standing for long periods of time.   Review of Systems See HPI   HISTORY: Past Medical, Surgical, Social, and Family History Reviewed & Updated per EMR.   Pertinent Historical Findings include:  Past Medical History:  Diagnosis Date   Allergy    Anxiety    Arthritis    no cartlidge in knees   Asthma    Bradycardia    a. atenolol stopped due to this, HR 40s.   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  CAD s/p RCA stent 2000 with residual mild-mod disease of left system 2001.   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   Osteoporosis 03/24/2015   Peripheral neuropathy 03/24/2015   Plantar fasciitis    Sleep apnea     Past Surgical History:  Procedure Laterality Date   BREAST LUMPECTOMY W/ NEEDLE LOCALIZATION  01/29/2011   Left with [facility] Dr Margot Chimes   CHOLECYSTECTOMY  1955   CORONARY ANGIOPLASTY WITH STENT PLACEMENT  2000   DILATION AND CURETTAGE OF UTERUS  1976   and [place]   tendons between thumb and forefinger   SKIN BIOPSY     1994, 2006, 2008, 2010, 2011, 2011, 2012-  pre-cancerous   TONSILLECTOMY  1955    Family History  Problem Relation Age of Onset   Heart disease Father    Cancer Father        intestinal polyps   Heart attack Father    Asthma Sister    Heart disease Brother    Heart attack Brother     Social History    Socioeconomic History   Marital status: Married    Spouse name: Not on file   Number of children: Not on file   Years of education: Not on file   Highest education level: Not on file  Occupational History   Not on file  Tobacco Use   Smoking status: Former    Packs/day: 1.00    Years: 4.00    Pack years: 4.00    Types: Cigarettes    Quit date: 12/05/1962    Years since quitting: 58.1   Smokeless tobacco: Never  Vaping Use   Vaping Use: Never used  Substance and Sexual Activity   Alcohol use: No   Drug use: No   Sexual activity: Not on file  Other Topics Concern   Not on file  Social History Narrative   Right handed   Social Determinants of Health   Financial Resource Strain: Low Risk    Difficulty of Paying Living Expenses: Not hard at all  Food Insecurity: No Food Insecurity   Worried About Charity fundraiser in the Last Year: Never true   [place] in the Last Year: Never  true  Transportation Needs: No Transportation Needs   Lack of Transportation (Medical): No   Lack of Transportation (Non-Medical): No  Physical Activity: Inactive   Days of Exercise per Week: 0 days   Minutes of Exercise per Session: 0 min  Stress: No Stress Concern Present   Feeling of Stress : Not at all  Social Connections: Socially Integrated   Frequency of Communication with Friends and Family: More than three times a week   Frequency of Social Gatherings with Friends and Family: More than three times a week   Attends Religious Services: More than 4 times per year   Active Member of [employer] or Organizations: Yes   Attends Music therapist: More than 4 times per year   Marital Status: Married  Human resources officer Violence: Not At Risk   Fear of Current or Ex-Partner: No   Emotionally Abused: No   Physically Abused: No   Sexually Abused: No     PHYSICAL EXAM:  VS: Ht _0  (1.676 m)   Wt 294 lb (133.4 kg)   BMI 47.45 kg/m  Physical Exam Gen: NAD, alert,  cooperative with exam, well-appearing   Patient was fitted for a standard, cushioned, semi-rigid orthotic. The orthotic was heated and afterward the patient stood on the orthotic blank positioned on the orthotic stand. The patient was positioned in subtalar neutral position and 10 degrees of ankle dorsiflexion in a weight bearing stance. After completion of molding, a stable base was applied to the orthotic blank. The blank was ground to a stable position for weight bearing. Size: 9 Pairs: 2 Base: cork  Additional Posting and Padding: Forefoot blue [place] The patient ambulated these, and they were very comfortable.   ASSESSMENT & PLAN:   Hammer toes of both feet Has balance issues and changes within the structure of each foot. -Counseled on home exercise therapy and supportive care. -Orthotics today.  Placed additional forefoot padding.  May need to consider metatarsal padding

## 2021-01-15 ENCOUNTER — Other Ambulatory Visit: Payer: Self-pay | Admitting: Internal Medicine

## 2021-01-15 NOTE — Telephone Encounter (Signed)
Please refill as per office routine med refill policy (all routine meds to be refilled for 3 mo or monthly (per pt preference) up to one year from last visit, then month to month grace period for 3 mo, then further med refills will have to be denied) ? ?

## 2021-01-16 NOTE — Progress Notes (Signed)
Zach Jennetta Flood [place] [address] [place] Phone: [phone] Subjective:   [name], am serving as a scribe for Dr. Hulan Saas. This visit occurred during the SARS-CoV-2 public health emergency.  Safety protocols were in place, including screening questions prior to the visit, additional usage of staff PPE, and extensive cleaning of exam room while observing appropriate contact time as indicated for disinfecting solutions.   I'm seeing this patient by the request  of:  Biagio Borg, MD  CC: Bilateral knee pain  NOM:[id number]  12/05/2020 Significant arthritic changes of the knees bilaterally. Of the provider and given steroid injections previously.  Would consider the possibility of viscosupplementation.  Patient will get approval patient is not a candidate for surgical intervention we will see patient back again 4 weeks  Patient does have tightness of the neck noted again.  Discussed with patient to take the Zanaflex nightly for the next 3 nights to avoid this.  Has only been going on for a day.  Patient does have what appears to be a potential lipoma in the area.  Patient has been referred for prophylactic Rx nothing will be beneficial for her.  Patient given the number to call at this time with patient not receiving a call previously she states.  Completely resolved at this time on the left leg.  Mild hemosiderin deposits still noted from the swelling.  Updated 01/17/2021 [name] is a 85 y.o. female coming in with complaint of knee pain. She is here for bilateral gel injections because the pain has not decreased.  Continues to have difficulty even with regular daily activities.     Past Medical History:  Diagnosis Date   Allergy    Anxiety    Arthritis    no cartlidge in knees   Asthma    Bradycardia    a. atenolol stopped due to this, HR 40s.   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  CAD s/p RCA stent 2000 with residual mild-mod disease of left system 2001.   Chronic atrial fibrillation (HCC)    COPD (chronic obstructive pulmonary disease) ([place])    Essential hypertension 03/24/2015   Gout    post operative   Hx of radiation therapy 04/02/11 -05/20/11   left breast   Hyperlipidemia    Hypothyroidism 03/24/2015   Incontinence of urine    Malignant neoplasm of upper-outer quadrant of female breast ([place]) 12/26/2010   Osteoporosis 03/24/2015   Peripheral neuropathy 03/24/2015   Plantar fasciitis    Sleep apnea    Past Surgical History:  Procedure Laterality Date   BREAST LUMPECTOMY W/ NEEDLE LOCALIZATION  01/29/2011   Left with [facility] Dr Margot Chimes   CHOLECYSTECTOMY  1955   CORONARY ANGIOPLASTY WITH STENT PLACEMENT  2000   DILATION AND CURETTAGE OF UTERUS  1976   and 1996   [facility]   tendons between thumb and forefinger   SKIN BIOPSY     1994, 2006, 2008, 2010, 2011, 2011, 2012-  pre-cancerous   TONSILLECTOMY  1955   Social History   Socioeconomic History   Marital status: Married    Spouse name: Not on file   Number of children: Not on file   Years of education: Not on file   Highest education level: Not on file  Occupational History   Not on file  Tobacco Use   Smoking  status: Former    Packs/day: 1.00    Years: 4.00    Pack years: 4.00    Types: Cigarettes    Quit date: 12/05/1962    Years since quitting: 58.1   Smokeless tobacco: Never  Vaping Use   Vaping Use: Never used  Substance and Sexual Activity   Alcohol use: No   Drug use: No   Sexual activity: Not on file  Other Topics Concern   Not on file  Social History Narrative   Right handed   Social Determinants of Health   Financial Resource Strain: Low Risk    Difficulty of Paying Living Expenses: Not hard at all  Food Insecurity: No Food Insecurity   Worried About Charity fundraiser in the Last Year: Never true   [place] in the Last Year: Never true   Transportation Needs: No Transportation Needs   Lack of Transportation (Medical): No   Lack of Transportation (Non-Medical): No  Physical Activity: Inactive   Days of Exercise per Week: 0 days   Minutes of Exercise per Session: 0 min  Stress: No Stress Concern Present   Feeling of Stress : Not at all  Social Connections: Socially Integrated   Frequency of Communication with Friends and Family: More than three times a week   Frequency of Social Gatherings with Friends and Family: More than three times a week   Attends Religious Services: More than 4 times per year   Active Member of [employer] or Organizations: Yes   Attends Music therapist: More than 4 times per year   Marital Status: Married   Allergies  Allergen Reactions   Crestor [Rosuvastatin Calcium]     Severe liver function problems   Hydrocodone-Acetaminophen Anxiety and Itching   Lidocaine Hcl Other (See Comments)    Novacaine:  Becomes very shaky   Lipitor [Atorvastatin Calcium] Other (See Comments)    Elevated liver function    Procaine Other (See Comments)    shaking   Rosuvastatin Other (See Comments)    Severe liver function problems   Theophylline Other (See Comments)    Becomes very shaky skaking skaking   Vicodin [Hydrocodone-Acetaminophen] Itching    Itching all over   Codeine Nausea Only, Anxiety and Other (See Comments)    Also complained of dizziness.  Has same problems with oxycodone and hydrocodone Visual Disturbance, Balance Difficulty, Dizzy "Room Spinning; "Swimming" Balance, vision, nausea, dizzy, "swimming", "room spinning" Balance, vision, nausea, dizzy, "swimming", "room spinning"   Oxycodone-Acetaminophen Nausea Only, Anxiety and Other (See Comments)    Visual Disturbance, Balance Difficulty, Dizzy "Room Spinning; "Swimming"   Propoxyphene Anxiety, Nausea Only and Other (See Comments)    Visual Disturbance, Balance Difficulty, Dizzy "Room Spinning; "Swimming" Balance, vision,  nausea, dizzy, "swimming", "room spinning" Balance, vision, nausea, dizzy, "swimming, "room spinning"   Quinine Derivatives Nausea Only    dizzy   Sulfa Antibiotics Nausea Only    Also complained of dizziness   Atorvastatin Other (See Comments)    Severe liver function problems   Ephedrine Other (See Comments)    shaking   Other Other (See Comments)    Quinine causes nausea, dizzy   Oxycodone Other (See Comments)    Balance, vision, nausea, dizzy, "swimming", "room spinning"   Quinine Other (See Comments)   Cephalexin Rash and Other (See Comments)   Epinephrine Other (See Comments)    Patient becomes "shaky" shaking shaking   Penicillins Rash    Pt reported all over body  rash in 1958 Has patient had a PCN reaction causing immediate rash, facial/tongue/throat swelling, SOB or lightheadedness with hypotension:Yes Has patient had a PCN reaction causing severe rash involving mucus membranes or skin necrosis: No Has patient had a PCN reaction that required hospitalization: No Has patient had a PCN reaction occurring within the last 10 years:No     Theophyllines Other (See Comments)    Patient becomes "shaky"   Family History  Problem Relation Age of Onset   Heart disease Father    Cancer Father        intestinal polyps   Heart attack Father    Asthma Sister    Heart disease Brother    Heart attack Brother     Current Outpatient Medications (Endocrine & Metabolic):    Betamethasone Sodium Phosphate 6 MG/ML SOLN, INJECT 2:4 SYRINGE BILATERALLY INTO EACH KNEE JOINT   levothyroxine (SYNTHROID) 75 MCG tablet, TAKE 1 TABLET(75 MCG) BY MOUTH DAILY  Current Outpatient Medications (Cardiovascular):    EPINEPHrine 0.3 mg/0.3 mL IJ SOAJ injection, Inject into the muscle.   furosemide (LASIX) 40 MG tablet, Take 1 tablet (40 mg total) by mouth daily.   hydrochlorothiazide (HYDRODIURIL) 25 MG tablet, TAKE 2 TABLETS(50 MG) BY MOUTH DAILY   metoprolol succinate (TOPROL-XL) 25 MG 24 hr  tablet, TAKE 1/2 TABLET(12.5 MG) BY MOUTH DAILY   REPATHA PUSHTRONEX SYSTEM 420 MG/3.5ML SOCT, INJECT 1 UNITS INTO THE SKIN EVERY 30 DAYS   rosuvastatin (CRESTOR) 20 MG tablet, Take 20 mg by mouth at bedtime.  Current Outpatient Medications (Respiratory):    albuterol (PROVENTIL) (2.5 MG/3ML) 0.083% nebulizer solution, Take 3 mLs (2.5 mg total) by nebulization every 6 (six) hours as needed for wheezing or shortness of breath.   albuterol (VENTOLIN HFA) 108 (90 Base) MCG/ACT inhaler, INHALE 2 PUFFS BY MOUTH INTO THE LUNGS IF NEEDED FOR WHEEZING AND OR SHORTNESS OF BREATH   ALLERGY RELIEF 180 MG tablet, TAKE 1 TABLET BY MOUTH EVERY DAY   azelastine (ASTELIN) 0.1 % nasal spray, Place 2 sprays into both nostrils daily. Use in each nostril as directed   Azelastine HCl 0.15 % SOLN, SMARTSIG:1 Spray(s) Both Nares Every Evening   fluticasone (FLONASE) 50 MCG/ACT nasal spray, Place 1 spray into both nostrils every morning.   guaiFENesin-dextromethorphan (ROBITUSSIN DM) 100-10 MG/5ML syrup, Take 5 mLs by mouth every 6 (six) hours as needed for cough.   levalbuterol (XOPENEX HFA) 45 MCG/ACT inhaler, Inhale 2 puffs into the lungs every 6 (six) hours as needed for wheezing or shortness of breath.   levocetirizine (XYZAL) 5 MG tablet, Take 1 tablet (5 mg total) by mouth every evening.   montelukast (SINGULAIR) 10 MG tablet, Take 1 tablet (10 mg total) by mouth at bedtime.   triamcinolone (NASACORT AQ) 55 MCG/ACT AERO nasal inhaler, Place 2 sprays daily into the nose.   WIXELA INHUB 500-50 MCG/DOSE AEPB, Inhale 1 puff into the lungs 2 (two) times daily.  Current Outpatient Medications (Analgesics):    allopurinol (ZYLOPRIM) 300 MG tablet, Take 300 mg by mouth daily.   colchicine 0.6 MG tablet, Take 0.6 mg by mouth daily as needed (flare  up).    traMADol-acetaminophen (ULTRACET) 37.5-325 MG tablet, Take 1-2 tablets by mouth every 6 (six) hours as needed for moderate pain or severe pain.   Current Outpatient  Medications (Hematological):    Cyanocobalamin (VITAMIN B-12 PO), Take 1 tablet by mouth daily.   rivaroxaban (XARELTO) 20 MG TABS tablet, TAKE 1 TABLET(20 MG) BY MOUTH DAILY WITH SUPPER  Current Outpatient Medications (Other):    ALPRAZolam (XANAX) 0.5 MG tablet, TAKE 1 TO 2 TABLETS BY MOUTH TWICE DAILY AS NEEDED FOR ANXIETY OR SLEEP   azelastine (OPTIVAR) 0.05 % ophthalmic solution, Place 1 drop into both eyes 2 (two) times daily.   B Complex Vitamins (VITAMIN B COMPLEX PO), Take 1 tablet by mouth daily.   Biotin 5000 MCG CAPS, Take 5,000 mcg by mouth daily.    cholecalciferol (VITAMIN D) 1000 UNITS tablet, Take 1,000 Units by mouth daily.   co-enzyme Q-10 50 MG capsule, Take 400 mg by mouth daily.   cycloSPORINE (RESTASIS) 0.05 % ophthalmic emulsion, Apply to eye as needed.   diclofenac sodium (VOLTAREN) 1 % GEL, Apply 4 g topically 4 (four) times daily as needed.   EQL Natural Zinc 50 MG TABS, Take 50 mg by mouth daily.   escitalopram (LEXAPRO) 10 MG tablet, TAKE 1 TABLET(10 MG) BY MOUTH DAILY   gabapentin (NEURONTIN) 300 MG capsule, Take 1 capsule (300 mg total) by mouth 3 (three) times daily.   mupirocin ointment (BACTROBAN) 2 %, Use twice a day as directed   nystatin cream (MYCOSTATIN), Apply 1 application topically daily as needed for dry skin.    ondansetron (ZOFRAN) 4 MG tablet, Take 1 tablet (4 mg total) by mouth every 8 (eight) hours as needed for nausea or vomiting.   potassium chloride SA (KLOR-CON) 20 MEQ tablet, Take 1 tablet (20 mEq total) by mouth daily.   Pyridoxine HCl (VITAMIN B-6 PO), Take 1 tablet by mouth daily.   Saccharomyces boulardii (FLORASTOR PO), Take 2 capsules by mouth daily.   tiZANidine (ZANAFLEX) 2 MG tablet, Take 1 tablet (2 mg total) by mouth at bedtime.   triamcinolone ointment (KENALOG) 0.5 %, Apply 1 application topically every other day as needed.   valACYclovir (VALTREX) 1000 MG tablet, TAKE 2 TABLETS BY MOUTH TWICE DAILY FOR 1 DAY-TAKE DOSES 12  HOURS APART   vitamin C (ASCORBIC ACID) 500 MG tablet, Take 500 mg by mouth daily.   VITAMIN E PO, Take 1 tablet by mouth daily.   Reviewed prior external information including notes and imaging from  primary care provider As well as notes that were available from care everywhere and other healthcare systems.  Past medical history, social, surgical and family history all reviewed in electronic medical record.  No pertanent information unless stated regarding to the chief complaint.   Review of Systems:  No headache, visual changes, nausea, vomiting, diarrhea, constipation, dizziness, abdominal pain, skin rash, fevers, chills, night sweats, weight loss, swollen lymph nodes, chest pain, shortness of breath, mood changes. POSITIVE muscle aches, body aches, joint swelling  Objective  Blood pressure (!) 142/94, pulse 79, height $RemoveBe'5\' 6"'[id number]$  (1.676 m), weight 289 lb (131.1 kg), SpO2 94 %.   General: No apparent distress alert and oriented x3 mood and affect normal, dressed appropriately.  HEENT: Pupils equal, extraocular movements intact  Respiratory: Patient's speak in full sentences and does not appear short of breath  Cardiovascular: No lower extremity edema, non tender, no erythema  Gait patient is seated in wheelchair MSK: Bilateral knees difficulty to assess secondary to body habitus.  Does have crepitus.  Lacks last 10 degrees of extension.  Does have abnormality of thigh to calf ratio and instability with valgus force.  After informed written and verbal consent, patient was seated on exam table. Right knee was prepped with alcohol swab and utilizing anterolateral approach, patient's right knee space was injected with 60 mg/73mL of Durolane (sodium  hyaluronate) in a prefilled syringe was injected easily into the knee through a 22-gauge needle..Patient tolerated the procedure well without immediate complications.  After informed written and verbal consent, patient was seated on exam table. Left  knee was prepped with alcohol swab and utilizing anterolateral approach, patient's left knee space was injected with 60 mg per 3 mL of Durolane (sodium hyaluronate) in a prefilled syringe was injected easily into the knee through a 22-gauge needle..Patient tolerated the procedure well without immediate complications.    Impression and Recommendations:    The above documentation has been reviewed and is accurate and complete Lyndal Pulley, DO

## 2021-01-17 ENCOUNTER — Ambulatory Visit (INDEPENDENT_AMBULATORY_CARE_PROVIDER_SITE_OTHER): Payer: Medicare Other | Admitting: Family Medicine

## 2021-01-17 ENCOUNTER — Other Ambulatory Visit: Payer: Self-pay

## 2021-01-17 DIAGNOSIS — M170 Bilateral primary osteoarthritis of knee: Secondary | ICD-10-CM | POA: Diagnosis not present

## 2021-01-17 NOTE — Assessment & Plan Note (Addendum)
Worsening pain.  Chronic condition with exacerbation.  Due to patient's age and other comorbidities difficult to treat.  These are some social determinants of health.  Discussed HEP  Discussed staying active  RTC in 8 weeks

## 2021-01-17 NOTE — Patient Instructions (Signed)
Good to see you! Read about PRP for your shoulder Knees can get better and better over the next month then should last several months See you again in 2 months just in case

## 2021-01-18 ENCOUNTER — Telehealth: Payer: Self-pay | Admitting: *Deleted

## 2021-01-18 ENCOUNTER — Encounter: Payer: Self-pay | Admitting: Family Medicine

## 2021-01-18 NOTE — Telephone Encounter (Signed)
Pt called to let you know that after receiving the knee injection yesterday her knee was swollen, red, & warm to touch last night. This morning the knee is still slightly swollen & red but not longer warm to touch.

## 2021-01-18 NOTE — Telephone Encounter (Signed)
Very rare but can happen after the injection  Can we get another update say around 3 today?

## 2021-02-05 ENCOUNTER — Other Ambulatory Visit: Payer: Self-pay | Admitting: Internal Medicine

## 2021-02-06 ENCOUNTER — Other Ambulatory Visit: Payer: Self-pay | Admitting: Internal Medicine

## 2021-02-06 NOTE — Telephone Encounter (Signed)
Please refill as per office routine med refill policy (all routine meds to be refilled for 3 mo or monthly (per pt preference) up to one year from last visit, then month to month grace period for 3 mo, then further med refills will have to be denied) ? ?

## 2021-02-20 DIAGNOSIS — Z1231 Encounter for screening mammogram for malignant neoplasm of breast: Secondary | ICD-10-CM | POA: Diagnosis not present

## 2021-02-20 LAB — HM MAMMOGRAPHY

## 2021-02-21 DIAGNOSIS — M2550 Pain in unspecified joint: Secondary | ICD-10-CM | POA: Diagnosis not present

## 2021-02-21 DIAGNOSIS — Z79899 Other long term (current) drug therapy: Secondary | ICD-10-CM | POA: Diagnosis not present

## 2021-02-21 DIAGNOSIS — M1009 Idiopathic gout, multiple sites: Secondary | ICD-10-CM | POA: Diagnosis not present

## 2021-02-21 DIAGNOSIS — M170 Bilateral primary osteoarthritis of knee: Secondary | ICD-10-CM | POA: Diagnosis not present

## 2021-03-20 NOTE — Progress Notes (Deleted)
[place] Phone: [phone] Subjective:    I'm seeing this patient by the request  of:  Biagio Borg, MD  CC:   [id number]  01/17/2021 Worsening pain.  Chronic condition with exacerbation.  Due to patient's age and other comorbidities difficult to treat.  These are some social determinants of health.  Discussed HEP  Discussed staying active  RTC in 8 weeks   Updated 03/21/2021 [name] is a 85 y.o. female coming in with complaint of bilateral knee pain  Onset-  Location Duration-  Character- Aggravating factors- Reliving factors-  Therapies tried-  Severity-     Past Medical History:  Diagnosis Date   Allergy    Anxiety    Arthritis    no cartlidge in knees   Asthma    Bradycardia    a. atenolol stopped due to this, HR 40s.   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  CAD s/p RCA stent 2000 with residual mild-mod disease of left system 2001.   Chronic atrial fibrillation (HCC)    COPD (chronic obstructive pulmonary disease) ([place])    Essential hypertension 03/24/2015   Gout    post operative   Hx of radiation therapy 04/02/11 -05/20/11   left breast   Hyperlipidemia    Hypothyroidism 03/24/2015   Incontinence of urine    Malignant neoplasm of upper-outer quadrant of female breast ([place]) 12/26/2010   Osteoporosis 03/24/2015   Peripheral neuropathy 03/24/2015   Plantar fasciitis    Sleep apnea    Past Surgical History:  Procedure Laterality Date   BREAST LUMPECTOMY W/ NEEDLE LOCALIZATION  01/29/2011   Left with [facility] Dr Margot Chimes   CHOLECYSTECTOMY  1955   CORONARY ANGIOPLASTY WITH STENT PLACEMENT  2000   DILATION AND CURETTAGE OF UTERUS  1976   and [place]   tendons between thumb and forefinger   SKIN BIOPSY     1994, 2006, 2008, 2010, 2011, 2011, 2012-  pre-cancerous   TONSILLECTOMY  1955   Social History    Socioeconomic History   Marital status: Married    Spouse name: Not on file   Number of children: Not on file   Years of education: Not on file   Highest education level: Not on file  Occupational History   Not on file  Tobacco Use   Smoking status: Former    Packs/day: 1.00    Years: 4.00    Pack years: 4.00    Types: Cigarettes    Quit date: 12/05/1962    Years since quitting: 58.3   Smokeless tobacco: Never  Vaping Use   Vaping Use: Never used  Substance and Sexual Activity   Alcohol use: No   Drug use: No   Sexual activity: Not on file  Other Topics Concern   Not on file  Social History Narrative   Right handed   Social Determinants of Health   Financial Resource Strain: Not on file  Food Insecurity: Not on file  Transportation Needs: Not on file  Physical Activity: Not on file  Stress: Not on file  Social Connections: Not on file   Allergies  Allergen Reactions   Crestor [Rosuvastatin Calcium]     Severe liver function problems   Hydrocodone-Acetaminophen Anxiety and Itching   Lidocaine Hcl Other (See Comments)    Novacaine:  Becomes  very shaky   Lipitor [Atorvastatin Calcium] Other (See Comments)    Elevated liver function    Procaine Other (See Comments)    shaking   Rosuvastatin Other (See Comments)    Severe liver function problems   Theophylline Other (See Comments)    Becomes very shaky skaking skaking   Vicodin [Hydrocodone-Acetaminophen] Itching    Itching all over   Codeine Nausea Only, Anxiety and Other (See Comments)    Also complained of dizziness.  Has same problems with oxycodone and hydrocodone Visual Disturbance, Balance Difficulty, Dizzy "Room Spinning; "Swimming" Balance, vision, nausea, dizzy, "swimming", "room spinning" Balance, vision, nausea, dizzy, "swimming", "room spinning"   Oxycodone-Acetaminophen Nausea Only, Anxiety and Other (See Comments)    Visual Disturbance, Balance Difficulty, Dizzy "Room Spinning; "Swimming"    Propoxyphene Anxiety, Nausea Only and Other (See Comments)    Visual Disturbance, Balance Difficulty, Dizzy "Room Spinning; "Swimming" Balance, vision, nausea, dizzy, "swimming", "room spinning" Balance, vision, nausea, dizzy, "swimming, "room spinning"   Quinine Derivatives Nausea Only    dizzy   Sulfa Antibiotics Nausea Only    Also complained of dizziness   Atorvastatin Other (See Comments)    Severe liver function problems   Ephedrine Other (See Comments)    shaking   Other Other (See Comments)    Quinine causes nausea, dizzy   Oxycodone Other (See Comments)    Balance, vision, nausea, dizzy, "swimming", "room spinning"   Quinine Other (See Comments)   Cephalexin Rash and Other (See Comments)   Epinephrine Other (See Comments)    Patient becomes "shaky" shaking shaking   Penicillins Rash    Pt reported all over body rash in 1958 Has patient had a PCN reaction causing immediate rash, facial/tongue/throat swelling, SOB or lightheadedness with hypotension:Yes Has patient had a PCN reaction causing severe rash involving mucus membranes or skin necrosis: No Has patient had a PCN reaction that required hospitalization: No Has patient had a PCN reaction occurring within the last 10 years:No     Theophyllines Other (See Comments)    Patient becomes "shaky"   Family History  Problem Relation Age of Onset   Heart disease Father    Cancer Father        intestinal polyps   Heart attack Father    Asthma Sister    Heart disease Brother    Heart attack Brother     Current Outpatient Medications (Endocrine & Metabolic):    Betamethasone Sodium Phosphate 6 MG/ML SOLN, INJECT 2:4 SYRINGE BILATERALLY INTO EACH KNEE JOINT   levothyroxine (SYNTHROID) 75 MCG tablet, TAKE 1 TABLET(75 MCG) BY MOUTH DAILY  Current Outpatient Medications (Cardiovascular):    EPINEPHrine 0.3 mg/0.3 mL IJ SOAJ injection, Inject into the muscle.   furosemide (LASIX) 40 MG tablet, Take 1 tablet (40 mg total)  by mouth daily.   hydrochlorothiazide (HYDRODIURIL) 25 MG tablet, TAKE 2 TABLETS(50 MG) BY MOUTH DAILY   metoprolol succinate (TOPROL-XL) 25 MG 24 hr tablet, TAKE 1/2 TABLET(12.5 MG) BY MOUTH DAILY   REPATHA PUSHTRONEX SYSTEM 420 MG/3.5ML SOCT, INJECT 1 UNITS INTO THE SKIN EVERY 30 DAYS   rosuvastatin (CRESTOR) 20 MG tablet, Take 20 mg by mouth at bedtime.  Current Outpatient Medications (Respiratory):    albuterol (PROVENTIL) (2.5 MG/3ML) 0.083% nebulizer solution, Take 3 mLs (2.5 mg total) by nebulization every 6 (six) hours as needed for wheezing or shortness of breath.   albuterol (VENTOLIN HFA) 108 (90 Base) MCG/ACT inhaler, INHALE 2 PUFFS BY MOUTH INTO THE LUNGS IF NEEDED  FOR WHEEZING AND OR SHORTNESS OF BREATH   ALLERGY RELIEF 180 MG tablet, TAKE 1 TABLET BY MOUTH EVERY DAY   azelastine (ASTELIN) 0.1 % nasal spray, Place 2 sprays into both nostrils daily. Use in each nostril as directed   Azelastine HCl 0.15 % SOLN, SMARTSIG:1 Spray(s) Both Nares Every Evening   fluticasone (FLONASE) 50 MCG/ACT nasal spray, Place 1 spray into both nostrils every morning.   guaiFENesin-dextromethorphan (ROBITUSSIN DM) 100-10 MG/5ML syrup, Take 5 mLs by mouth every 6 (six) hours as needed for cough.   levalbuterol (XOPENEX HFA) 45 MCG/ACT inhaler, Inhale 2 puffs into the lungs every 6 (six) hours as needed for wheezing or shortness of breath.   levocetirizine (XYZAL) 5 MG tablet, Take 1 tablet (5 mg total) by mouth every evening.   montelukast (SINGULAIR) 10 MG tablet, Take 1 tablet (10 mg total) by mouth at bedtime.   triamcinolone (NASACORT AQ) 55 MCG/ACT AERO nasal inhaler, Place 2 sprays daily into the nose.   WIXELA INHUB 500-50 MCG/DOSE AEPB, Inhale 1 puff into the lungs 2 (two) times daily.  Current Outpatient Medications (Analgesics):    allopurinol (ZYLOPRIM) 300 MG tablet, Take 300 mg by mouth daily.   colchicine 0.6 MG tablet, Take 0.6 mg by mouth daily as needed (flare  up).     traMADol-acetaminophen (ULTRACET) 37.5-325 MG tablet, Take 1-2 tablets by mouth every 6 (six) hours as needed for moderate pain or severe pain.   Current Outpatient Medications (Hematological):    Cyanocobalamin (VITAMIN B-12 PO), Take 1 tablet by mouth daily.   rivaroxaban (XARELTO) 20 MG TABS tablet, TAKE 1 TABLET(20 MG) BY MOUTH DAILY WITH SUPPER  Current Outpatient Medications (Other):    ALPRAZolam (XANAX) 0.5 MG tablet, TAKE 1 TO 2 TABLETS BY MOUTH TWICE DAILY AS NEEDED FOR ANXIETY OR SLEEP   azelastine (OPTIVAR) 0.05 % ophthalmic solution, Place 1 drop into both eyes 2 (two) times daily.   B Complex Vitamins (VITAMIN B COMPLEX PO), Take 1 tablet by mouth daily.   Biotin 5000 MCG CAPS, Take 5,000 mcg by mouth daily.    cholecalciferol (VITAMIN D) 1000 UNITS tablet, Take 1,000 Units by mouth daily.   co-enzyme Q-10 50 MG capsule, Take 400 mg by mouth daily.   cycloSPORINE (RESTASIS) 0.05 % ophthalmic emulsion, Apply to eye as needed.   diclofenac sodium (VOLTAREN) 1 % GEL, Apply 4 g topically 4 (four) times daily as needed.   EQL Natural Zinc 50 MG TABS, Take 50 mg by mouth daily.   escitalopram (LEXAPRO) 10 MG tablet, TAKE 1 TABLET(10 MG) BY MOUTH DAILY   gabapentin (NEURONTIN) 300 MG capsule, Take 1 capsule (300 mg total) by mouth 3 (three) times daily.   mupirocin ointment (BACTROBAN) 2 %, Use twice a day as directed   nystatin cream (MYCOSTATIN), Apply 1 application topically daily as needed for dry skin.    ondansetron (ZOFRAN) 4 MG tablet, Take 1 tablet (4 mg total) by mouth every 8 (eight) hours as needed for nausea or vomiting.   potassium chloride SA (KLOR-CON) 20 MEQ tablet, TAKE 1 TABLET(20 MEQ) BY MOUTH DAILY   Pyridoxine HCl (VITAMIN B-6 PO), Take 1 tablet by mouth daily.   Saccharomyces boulardii (FLORASTOR PO), Take 2 capsules by mouth daily.   tiZANidine (ZANAFLEX) 2 MG tablet, Take 1 tablet (2 mg total) by mouth at bedtime.   triamcinolone ointment (KENALOG) 0.5 %,  Apply 1 application topically every other day as needed.   valACYclovir (VALTREX) 1000 MG tablet, TAKE  2 TABLETS BY MOUTH TWICE DAILY FOR 1 DAY-TAKE DOSES 12 HOURS APART   vitamin C (ASCORBIC ACID) 500 MG tablet, Take 500 mg by mouth daily.   VITAMIN E PO, Take 1 tablet by mouth daily.   Reviewed prior external information including notes and imaging from  primary care provider As well as notes that were available from care everywhere and other healthcare systems.  Past medical history, social, surgical and family history all reviewed in electronic medical record.  No pertanent information unless stated regarding to the chief complaint.   Review of Systems:  No headache, visual changes, nausea, vomiting, diarrhea, constipation, dizziness, abdominal pain, skin rash, fevers, chills, night sweats, weight loss, swollen lymph nodes, body aches, joint swelling, chest pain, shortness of breath, mood changes. POSITIVE muscle aches  Objective  There were no vitals taken for this visit.   General: No apparent distress alert and oriented x3 mood and affect normal, dressed appropriately.  HEENT: Pupils equal, extraocular movements intact  Respiratory: Patient's speak in full sentences and does not appear short of breath  Cardiovascular: No lower extremity edema, non tender, no erythema  Gait normal with good balance and coordination.  MSK:  Non tender with full range of motion and good stability and symmetric strength and tone of shoulders, elbows, wrist, hip, knee and ankles bilaterally.     Impression and Recommendations:     The above documentation has been reviewed and is accurate and complete Belva Agee

## 2021-03-21 ENCOUNTER — Ambulatory Visit: Payer: Medicare Other | Admitting: Family Medicine

## 2021-03-22 NOTE — Progress Notes (Signed)
[name] [address] [place] [address] Phone: [phone] Subjective:   [name], am serving as a scribe for Dr. Antoine Primas. This visit occurred during the SARS-CoV-2 public health emergency.  Safety protocols were in place, including screening questions prior to the visit, additional usage of staff PPE, and extensive cleaning of exam room while observing appropriate contact time as indicated for disinfecting solutions.   I'm seeing this patient by the request  of:  Mercedes Levins, MD  CC: Bilateral knee pain  [id number]  01/17/2021 Worsening pain.  Chronic condition with exacerbation.  Due to patient's age and other comorbidities difficult to treat.  These are some social determinants of health.  Discussed HEP  Discussed staying active  RTC in 8 weeks   Update 03/23/2021 [name] is a 85 y.o. female coming in with complaint of B knee pain. Patient states progressively better. Everything remains the same. These last injections didn't seem to help much. Arnica gel works better than cream. No new complaints. Patient does states that he still feels that there is significant instability more than the pain.  Patient is wondering what else can be done for this.      Past Medical History:  Diagnosis Date   Allergy    Anxiety    Arthritis    no cartlidge in knees   Asthma    Bradycardia    a. atenolol stopped due to this, HR 40s.   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  CAD s/p RCA stent 2000 with residual mild-mod disease of left system 2001.   Chronic atrial fibrillation (HCC)    COPD (chronic obstructive pulmonary disease) ([facility])    Essential hypertension 03/24/2015   Gout    post operative   Hx of radiation therapy 04/02/11 -05/20/11   left breast   Hyperlipidemia    Hypothyroidism 03/24/2015   Incontinence of urine    Malignant neoplasm of upper-outer quadrant of female  breast ([facility]) 12/26/2010   Osteoporosis 03/24/2015   Peripheral neuropathy 03/24/2015   Plantar fasciitis    Sleep apnea    Past Surgical History:  Procedure Laterality Date   BREAST LUMPECTOMY W/ NEEDLE LOCALIZATION  01/29/2011   Left with [facility] Dr Jamey Ripa   CHOLECYSTECTOMY  1955   CORONARY ANGIOPLASTY WITH STENT PLACEMENT  2000   DILATION AND CURETTAGE OF UTERUS  1976   and 1996   HAND SURGERY  1992   tendons between thumb and forefinger   SKIN BIOPSY     1994, 2006, 2008, 2010, 2011, 2011, 2012-  pre-cancerous   TONSILLECTOMY  1955   Social History   Socioeconomic History   Marital status: Married    Spouse name: Not on file   Number of children: Not on file   Years of education: Not on file   Highest education level: Not on file  Occupational History   Not on file  Tobacco Use   Smoking status: Former    Packs/day: 1.00    Years: 4.00    Pack years: 4.00    Types: Cigarettes    Quit date: 12/05/1962    Years since quitting: 58.3   Smokeless tobacco: Never  Vaping Use   Vaping Use: Never used  Substance and Sexual Activity   Alcohol use: No   Drug use: No   Sexual activity: Not on file  Other  Topics Concern   Not on file  Social History Narrative   Right handed   Social Determinants of Health   Financial Resource Strain: Not on file  Food Insecurity: Not on file  Transportation Needs: Not on file  Physical Activity: Not on file  Stress: Not on file  Social Connections: Not on file   Allergies  Allergen Reactions   Crestor [Rosuvastatin Calcium]     Severe liver function problems   Hydrocodone-Acetaminophen Anxiety and Itching   Lidocaine Hcl Other (See Comments)    Novacaine:  Becomes very shaky   Lipitor [Atorvastatin Calcium] Other (See Comments)    Elevated liver function    Procaine Other (See Comments)    shaking   Rosuvastatin Other (See Comments)    Severe liver function problems   Theophylline Other (See Comments)    Becomes very  shaky skaking skaking   Vicodin [Hydrocodone-Acetaminophen] Itching    Itching all over   Codeine Nausea Only, Anxiety and Other (See Comments)    Also complained of dizziness.  Has same problems with oxycodone and hydrocodone Visual Disturbance, Balance Difficulty, Dizzy "Room Spinning; "Swimming" Balance, vision, nausea, dizzy, "swimming", "room spinning" Balance, vision, nausea, dizzy, "swimming", "room spinning"   Oxycodone-Acetaminophen Nausea Only, Anxiety and Other (See Comments)    Visual Disturbance, Balance Difficulty, Dizzy "Room Spinning; "Swimming"   Propoxyphene Anxiety, Nausea Only and Other (See Comments)    Visual Disturbance, Balance Difficulty, Dizzy "Room Spinning; "Swimming" Balance, vision, nausea, dizzy, "swimming", "room spinning" Balance, vision, nausea, dizzy, "swimming, "room spinning"   Quinine Derivatives Nausea Only    dizzy   Sulfa Antibiotics Nausea Only    Also complained of dizziness   Atorvastatin Other (See Comments)    Severe liver function problems   Ephedrine Other (See Comments)    shaking   Other Other (See Comments)    Quinine causes nausea, dizzy   Oxycodone Other (See Comments)    Balance, vision, nausea, dizzy, "swimming", "room spinning"   Quinine Other (See Comments)   Cephalexin Rash and Other (See Comments)   Epinephrine Other (See Comments)    Patient becomes "shaky" shaking shaking   Penicillins Rash    Pt reported all over body rash in 1958 Has patient had a PCN reaction causing immediate rash, facial/tongue/throat swelling, SOB or lightheadedness with hypotension:Yes Has patient had a PCN reaction causing severe rash involving mucus membranes or skin necrosis: No Has patient had a PCN reaction that required hospitalization: No Has patient had a PCN reaction occurring within the last 10 years:No     Theophyllines Other (See Comments)    Patient becomes "shaky"   Family History  Problem Relation Age of Onset   Heart  disease Father    Cancer Father        intestinal polyps   Heart attack Father    Asthma Sister    Heart disease Brother    Heart attack Brother     Current Outpatient Medications (Endocrine & Metabolic):    Betamethasone Sodium Phosphate 6 MG/ML SOLN, INJECT 2:4 SYRINGE BILATERALLY INTO EACH KNEE JOINT   levothyroxine (SYNTHROID) 75 MCG tablet, TAKE 1 TABLET(75 MCG) BY MOUTH DAILY  Current Outpatient Medications (Cardiovascular):    EPINEPHrine 0.3 mg/0.3 mL IJ SOAJ injection, Inject into the muscle.   furosemide (LASIX) 40 MG tablet, Take 1 tablet (40 mg total) by mouth daily.   hydrochlorothiazide (HYDRODIURIL) 25 MG tablet, TAKE 2 TABLETS(50 MG) BY MOUTH DAILY   metoprolol succinate (TOPROL-XL) 25 MG  24 hr tablet, TAKE 1/2 TABLET(12.5 MG) BY MOUTH DAILY   REPATHA PUSHTRONEX SYSTEM 420 MG/3.5ML SOCT, INJECT 1 UNITS INTO THE SKIN EVERY 30 DAYS   rosuvastatin (CRESTOR) 20 MG tablet, Take 20 mg by mouth at bedtime.  Current Outpatient Medications (Respiratory):    albuterol (PROVENTIL) (2.5 MG/3ML) 0.083% nebulizer solution, Take 3 mLs (2.5 mg total) by nebulization every 6 (six) hours as needed for wheezing or shortness of breath.   albuterol (VENTOLIN HFA) 108 (90 Base) MCG/ACT inhaler, INHALE 2 PUFFS BY MOUTH INTO THE LUNGS IF NEEDED FOR WHEEZING AND OR SHORTNESS OF BREATH   ALLERGY RELIEF 180 MG tablet, TAKE 1 TABLET BY MOUTH EVERY DAY   azelastine (ASTELIN) 0.1 % nasal spray, Place 2 sprays into both nostrils daily. Use in each nostril as directed   Azelastine HCl 0.15 % SOLN, SMARTSIG:1 Spray(s) Both Nares Every Evening   fluticasone (FLONASE) 50 MCG/ACT nasal spray, Place 1 spray into both nostrils every morning.   guaiFENesin-dextromethorphan (ROBITUSSIN DM) 100-10 MG/5ML syrup, Take 5 mLs by mouth every 6 (six) hours as needed for cough.   levalbuterol (XOPENEX HFA) 45 MCG/ACT inhaler, Inhale 2 puffs into the lungs every 6 (six) hours as needed for wheezing or shortness of  breath.   levocetirizine (XYZAL) 5 MG tablet, Take 1 tablet (5 mg total) by mouth every evening.   montelukast (SINGULAIR) 10 MG tablet, Take 1 tablet (10 mg total) by mouth at bedtime.   triamcinolone (NASACORT AQ) 55 MCG/ACT AERO nasal inhaler, Place 2 sprays daily into the nose.   WIXELA INHUB 500-50 MCG/DOSE AEPB, Inhale 1 puff into the lungs 2 (two) times daily.  Current Outpatient Medications (Analgesics):    allopurinol (ZYLOPRIM) 300 MG tablet, Take 300 mg by mouth daily.   colchicine 0.6 MG tablet, Take 0.6 mg by mouth daily as needed (flare  up).    traMADol-acetaminophen (ULTRACET) 37.5-325 MG tablet, Take 1-2 tablets by mouth every 6 (six) hours as needed for moderate pain or severe pain.   Current Outpatient Medications (Hematological):    Cyanocobalamin (VITAMIN B-12 PO), Take 1 tablet by mouth daily.   rivaroxaban (XARELTO) 20 MG TABS tablet, TAKE 1 TABLET(20 MG) BY MOUTH DAILY WITH SUPPER  Current Outpatient Medications (Other):    ALPRAZolam (XANAX) 0.5 MG tablet, TAKE 1 TO 2 TABLETS BY MOUTH TWICE DAILY AS NEEDED FOR ANXIETY OR SLEEP   azelastine (OPTIVAR) 0.05 % ophthalmic solution, Place 1 drop into both eyes 2 (two) times daily.   B Complex Vitamins (VITAMIN B COMPLEX PO), Take 1 tablet by mouth daily.   Biotin 5000 MCG CAPS, Take 5,000 mcg by mouth daily.    cholecalciferol (VITAMIN D) 1000 UNITS tablet, Take 1,000 Units by mouth daily.   co-enzyme Q-10 50 MG capsule, Take 400 mg by mouth daily.   cycloSPORINE (RESTASIS) 0.05 % ophthalmic emulsion, Apply to eye as needed.   diclofenac sodium (VOLTAREN) 1 % GEL, Apply 4 g topically 4 (four) times daily as needed.   EQL Natural Zinc 50 MG TABS, Take 50 mg by mouth daily.   escitalopram (LEXAPRO) 10 MG tablet, TAKE 1 TABLET(10 MG) BY MOUTH DAILY   gabapentin (NEURONTIN) 300 MG capsule, Take 1 capsule (300 mg total) by mouth 3 (three) times daily.   mupirocin ointment (BACTROBAN) 2 %, Use twice a day as directed    nystatin cream (MYCOSTATIN), Apply 1 application topically daily as needed for dry skin.    ondansetron (ZOFRAN) 4 MG tablet, Take 1 tablet (4  mg total) by mouth every 8 (eight) hours as needed for nausea or vomiting.   potassium chloride SA (KLOR-CON) 20 MEQ tablet, TAKE 1 TABLET(20 MEQ) BY MOUTH DAILY   Pyridoxine HCl (VITAMIN B-6 PO), Take 1 tablet by mouth daily.   Saccharomyces boulardii (FLORASTOR PO), Take 2 capsules by mouth daily.   tiZANidine (ZANAFLEX) 2 MG tablet, Take 1 tablet (2 mg total) by mouth at bedtime.   triamcinolone ointment (KENALOG) 0.5 %, Apply 1 application topically every other day as needed.   valACYclovir (VALTREX) 1000 MG tablet, TAKE 2 TABLETS BY MOUTH TWICE DAILY FOR 1 DAY-TAKE DOSES 12 HOURS APART   vitamin C (ASCORBIC ACID) 500 MG tablet, Take 500 mg by mouth daily.   VITAMIN E PO, Take 1 tablet by mouth daily.   Reviewed prior external information including notes and imaging from  primary care provider As well as notes that were available from care everywhere and other healthcare systems.  Past medical history, social, surgical and family history all reviewed in electronic medical record.  No pertanent information unless stated regarding to the chief complaint.   Review of Systems:  No headache, visual changes, nausea, vomiting, diarrhea, constipation, dizziness, abdominal pain, skin rash, fevers, chills, night sweats, weight loss, swollen lymph nodes, body aches, joint swelling, chest pain, shortness of breath, mood changes. POSITIVE muscle aches  Objective  Blood pressure (!) 132/92, pulse 77, height $RemoveBe'5\' 6"'[id number]$  (1.676 m), weight 280 lb (127 kg), SpO2 95 %.   General: No apparent distress alert and oriented x3 mood and affect normal, dressed appropriately.  Morbidly obese HEENT: Pupils equal, extraocular movements intact  Respiratory: Patient's speak in full sentences and does not appear short of breath  Cardiovascular: Trace lower extremity edema, non  tender, no erythema  seated in wheelchair.  Is able to stand up if necessary. Knee exams bilaterally do have a trace effusion noted.  Tender to palpation of the medial joint space. Instability noted with valgus and varus force.  After informed written and verbal consent, patient was seated on exam table. Left knee was prepped with alcohol swab and utilizing anterolateral approach, patient's left knee space was injected with 4:1  marcaine 0.5%: Kenalog $RemoveBefor'40mg'txeGCadcknkg$ /dL. Patient tolerated the procedure well without immediate complications.  After informed written and verbal consent, patient was seated on exam table. Right knee was prepped with alcohol swab and utilizing anterolateral approach, patient's right knee space was injected with 4:1  marcaine 0.5%: Kenalog $RemoveBefor'40mg'SPFinTjMZJYS$ /dL. Patient tolerated the procedure well without immediate complications.   Impression and Recommendations:     The above documentation has been reviewed and is accurate and complete Lyndal Pulley, DO

## 2021-03-23 ENCOUNTER — Encounter: Payer: Self-pay | Admitting: Family Medicine

## 2021-03-23 ENCOUNTER — Other Ambulatory Visit: Payer: Self-pay

## 2021-03-23 ENCOUNTER — Ambulatory Visit: Payer: Medicare Other | Admitting: Family Medicine

## 2021-03-23 DIAGNOSIS — S060X0D Concussion without loss of consciousness, subsequent encounter: Secondary | ICD-10-CM

## 2021-03-23 DIAGNOSIS — M170 Bilateral primary osteoarthritis of knee: Secondary | ICD-10-CM | POA: Diagnosis not present

## 2021-03-23 NOTE — Assessment & Plan Note (Signed)
Patient is doing remarkably well.  Doing well with the pauses.  No significant difficulty.

## 2021-03-23 NOTE — Assessment & Plan Note (Signed)
Steroid injections given today and tolerated the procedure well.  Chronic problem with exacerbation.  Discussed with patient can repeat every 3 months if necessary.  Patient will continue to stay active where possible.  Still has some feelings of instability and would like to consider the possibility of aquatic therapy in the long run.  Will consider referral after the new year.  Follow-up with me again in 2 to 3 months

## 2021-03-23 NOTE — Patient Instructions (Signed)
Glad you're doing well Injections again today See you again in 6 weeks

## 2021-04-12 ENCOUNTER — Encounter: Payer: Self-pay | Admitting: Internal Medicine

## 2021-04-18 ENCOUNTER — Encounter: Payer: Self-pay | Admitting: Internal Medicine

## 2021-04-19 ENCOUNTER — Other Ambulatory Visit (INDEPENDENT_AMBULATORY_CARE_PROVIDER_SITE_OTHER): Payer: Medicare Other

## 2021-04-19 DIAGNOSIS — E782 Mixed hyperlipidemia: Secondary | ICD-10-CM

## 2021-04-19 DIAGNOSIS — E559 Vitamin D deficiency, unspecified: Secondary | ICD-10-CM

## 2021-04-19 DIAGNOSIS — E538 Deficiency of other specified B group vitamins: Secondary | ICD-10-CM

## 2021-04-19 DIAGNOSIS — R739 Hyperglycemia, unspecified: Secondary | ICD-10-CM | POA: Diagnosis not present

## 2021-04-19 DIAGNOSIS — G4733 Obstructive sleep apnea (adult) (pediatric): Secondary | ICD-10-CM | POA: Diagnosis not present

## 2021-04-19 LAB — HEPATIC FUNCTION PANEL
ALT: 11 U/L (ref 0–35)
AST: 14 U/L (ref 0–37)
Albumin: 4 g/dL (ref 3.5–5.2)
Alkaline Phosphatase: 64 U/L (ref 39–117)
Bilirubin, Direct: 0.2 mg/dL (ref 0.0–0.3)
Total Bilirubin: 0.8 mg/dL (ref 0.2–1.2)
Total Protein: 6.6 g/dL (ref 6.0–8.3)

## 2021-04-19 LAB — LIPID PANEL
Cholesterol: 168 mg/dL (ref 0–200)
HDL: 61.3 mg/dL (ref >39.00)
LDL Cholesterol: 84 mg/dL (ref 0–99)
NonHDL: 106.93
Total CHOL/HDL Ratio: 3
Triglycerides: 116 mg/dL (ref 0.0–149.0)
VLDL: 23.2 mg/dL (ref 0.0–40.0)

## 2021-04-19 LAB — URINALYSIS, ROUTINE W REFLEX MICROSCOPIC
Bilirubin Urine: NEGATIVE
Ketones, ur: NEGATIVE
Nitrite: NEGATIVE
Specific Gravity, Urine: 1.025 (ref 1.000–1.030)
Total Protein, Urine: NEGATIVE
Urine Glucose: NEGATIVE
Urobilinogen, UA: 1 (ref 0.0–1.0)
pH: 6 (ref 5.0–8.0)

## 2021-04-19 LAB — TSH: TSH: 7.42 u[IU]/mL — ABNORMAL HIGH (ref 0.35–5.50)

## 2021-04-19 LAB — CBC WITH DIFFERENTIAL/PLATELET
Basophils Absolute: 0.1 10*3/uL (ref 0.0–0.1)
Basophils Relative: 1.1 % (ref 0.0–3.0)
Eosinophils Absolute: 0.4 10*3/uL (ref 0.0–0.7)
Eosinophils Relative: 5.5 % — ABNORMAL HIGH (ref 0.0–5.0)
HCT: 40.2 % (ref 36.0–46.0)
Hemoglobin: 13.2 g/dL (ref 12.0–15.0)
Lymphocytes Relative: 18.3 % (ref 12.0–46.0)
Lymphs Abs: 1.3 10*3/uL (ref 0.7–4.0)
MCHC: 33 g/dL (ref 30.0–36.0)
MCV: 96.9 fl (ref 78.0–100.0)
Monocytes Absolute: 0.7 10*3/uL (ref 0.1–1.0)
Monocytes Relative: 9.9 % (ref 3.0–12.0)
Neutro Abs: 4.6 10*3/uL (ref 1.4–7.7)
Neutrophils Relative %: 65.2 % (ref 43.0–77.0)
Platelets: 164 10*3/uL (ref 150.0–400.0)
RBC: 4.14 Mil/uL (ref 3.87–5.11)
RDW: 15.6 % — ABNORMAL HIGH (ref 11.5–15.5)
WBC: 7.1 10*3/uL (ref 4.0–10.5)

## 2021-04-19 LAB — BASIC METABOLIC PANEL
BUN: 23 mg/dL (ref 6–23)
CO2: 30 mEq/L (ref 19–32)
Calcium: 9.4 mg/dL (ref 8.4–10.5)
Chloride: 105 mEq/L (ref 96–112)
Creatinine, Ser: 0.8 mg/dL (ref 0.40–1.20)
GFR: 68.03 mL/min (ref >60.00)
Glucose, Bld: 92 mg/dL (ref 70–99)
Potassium: 3.4 mEq/L — ABNORMAL LOW (ref 3.5–5.1)
Sodium: 142 mEq/L (ref 135–145)

## 2021-04-19 LAB — HEMOGLOBIN A1C: Hgb A1c MFr Bld: 6.1 % (ref 4.6–6.5)

## 2021-04-19 LAB — VITAMIN D 25 HYDROXY (VIT D DEFICIENCY, FRACTURES): VITD: 41.19 ng/mL (ref 30.00–100.00)

## 2021-04-19 LAB — VITAMIN B12: Vitamin B-12: 399 pg/mL (ref 211–911)

## 2021-04-24 ENCOUNTER — Ambulatory Visit (INDEPENDENT_AMBULATORY_CARE_PROVIDER_SITE_OTHER): Payer: Medicare Other | Admitting: Internal Medicine

## 2021-04-24 ENCOUNTER — Encounter: Payer: Self-pay | Admitting: Internal Medicine

## 2021-04-24 ENCOUNTER — Other Ambulatory Visit: Payer: Self-pay

## 2021-04-24 VITALS — BP 118/74 | HR 81 | Temp 98.5°F | Ht 66.0 in | Wt 280.0 lb

## 2021-04-24 DIAGNOSIS — I10 Essential (primary) hypertension: Secondary | ICD-10-CM | POA: Diagnosis not present

## 2021-04-24 DIAGNOSIS — E782 Mixed hyperlipidemia: Secondary | ICD-10-CM | POA: Diagnosis not present

## 2021-04-24 DIAGNOSIS — E039 Hypothyroidism, unspecified: Secondary | ICD-10-CM

## 2021-04-24 DIAGNOSIS — E538 Deficiency of other specified B group vitamins: Secondary | ICD-10-CM

## 2021-04-24 DIAGNOSIS — N390 Urinary tract infection, site not specified: Secondary | ICD-10-CM | POA: Diagnosis not present

## 2021-04-24 DIAGNOSIS — E559 Vitamin D deficiency, unspecified: Secondary | ICD-10-CM

## 2021-04-24 DIAGNOSIS — Z23 Encounter for immunization: Secondary | ICD-10-CM | POA: Diagnosis not present

## 2021-04-24 DIAGNOSIS — R739 Hyperglycemia, unspecified: Secondary | ICD-10-CM | POA: Diagnosis not present

## 2021-04-24 MED ORDER — NITROFURANTOIN MACROCRYSTAL 50 MG PO CAPS: ORAL_CAPSULE | ORAL | 3 refills | Status: DC

## 2021-04-24 MED ORDER — POTASSIUM CHLORIDE CRYS ER 20 MEQ PO TBCR: EXTENDED_RELEASE_TABLET | ORAL | 3 refills | Status: DC

## 2021-04-24 NOTE — Patient Instructions (Signed)
Please take all new medication as prescribed - the antibiotic three times weekly  Please continue all other medications as before, and refills have been done if requested -the potassium  Please have the pharmacy call with any other refills you may need.  Please continue your efforts at being more active, low cholesterol diet, and weight control.  Please keep your appointments with your specialists as you may have planned ; Please make an Appointment to return in 6 months, or sooner if needed, also with Labs done a few day ahead as per this visit

## 2021-04-24 NOTE — Progress Notes (Signed)
Patient ID: [name], female   DOB: May 21, 1935, 85 y.o.   MRN: [id number]        Chief Complaint: follow up abnormal tsh, chronic uti with flares, low K, hld       HPI:  [name] is a 85 y.o. female here overall doing ok, Denies hyper or hypo thyroid symptoms such as voice, skin or hair change.  Denies urinary symptoms such frequency, urgency, flank pain, hematuria or n/v, fever, chills, but has chronic low grade dysuria most days of the week with intermittent flares of full blown mod to severe.  Needs K refill.  Trying to follow lower chol diet, and only able to tolerate crestor current dose twice weekly.  Pt denies chest pain, increased sob or doe, wheezing, orthopnea, PND, increased LE swelling, palpitations, dizziness or syncope.   Pt denies polydipsia, polyuria, or new focal neuro s/s.  No other new complaints  Due for flu shot       Wt Readings from Last 3 Encounters:  04/24/21 280 lb (127 kg)  03/23/21 280 lb (127 kg)  01/17/21 289 lb (131.1 kg)   BP Readings from Last 3 Encounters:  04/24/21 118/74  03/23/21 (!) 132/92  01/17/21 (!) 142/94         Past Medical History:  Diagnosis Date   Allergy    Anxiety    Arthritis    no cartlidge in knees   Asthma    Bradycardia    a. atenolol stopped due to this, HR 40s.   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  CAD s/p RCA stent 2000 with residual mild-mod disease of left system 2001.   Chronic atrial fibrillation ([facility])    COPD (chronic obstructive pulmonary disease) ([place])    Essential hypertension 03/24/2015   Gout    post operative   Hx of radiation therapy 04/02/11 -05/20/11   left breast   Hyperlipidemia    Hypothyroidism 03/24/2015   Incontinence of urine    Malignant neoplasm of upper-outer quadrant of female breast ([place]) 12/26/2010   Osteoporosis 03/24/2015   Peripheral neuropathy 03/24/2015   Plantar fasciitis    Sleep apnea    Past Surgical History:  Procedure  Laterality Date   BREAST LUMPECTOMY W/ NEEDLE LOCALIZATION  01/29/2011   Left with [facility] Dr Margot Chimes   CHOLECYSTECTOMY  1955   CORONARY ANGIOPLASTY WITH STENT PLACEMENT  2000   DILATION AND CURETTAGE OF UTERUS  1976   and 1996   [place]   tendons between thumb and forefinger   SKIN BIOPSY     1994, 2006, 2008, 2010, 2011, 2011, 2012-  pre-cancerous   TONSILLECTOMY  1955    reports that she quit smoking about 58 years ago. Her smoking use included cigarettes. She has a 4.00 pack-year smoking history. She has never used smokeless tobacco. She reports that she does not drink alcohol and does not use drugs. family history includes Asthma in her sister; Cancer in her father; Heart attack in her brother and father; Heart disease in her brother and father. Allergies  Allergen Reactions   Crestor [Rosuvastatin Calcium]     Severe liver function problems   Hydrocodone-Acetaminophen Anxiety and Itching   Lidocaine Hcl Other (See Comments)    Novacaine:  Becomes very shaky   Lipitor [Atorvastatin Calcium] Other (See Comments)    Elevated liver function    Procaine Other (See  Comments)    shaking   Rosuvastatin Other (See Comments)    Severe liver function problems   Theophylline Other (See Comments)    Becomes very shaky skaking skaking   Vicodin [Hydrocodone-Acetaminophen] Itching    Itching all over   Codeine Nausea Only, Anxiety and Other (See Comments)    Also complained of dizziness.  Has same problems with oxycodone and hydrocodone Visual Disturbance, Balance Difficulty, Dizzy "Room Spinning; "Swimming" Balance, vision, nausea, dizzy, "swimming", "room spinning" Balance, vision, nausea, dizzy, "swimming", "room spinning"   Oxycodone-Acetaminophen Nausea Only, Anxiety and Other (See Comments)    Visual Disturbance, Balance Difficulty, Dizzy "Room Spinning; "Swimming"   Propoxyphene Anxiety, Nausea Only and Other (See Comments)    Visual Disturbance, Balance Difficulty,  Dizzy "Room Spinning; "Swimming" Balance, vision, nausea, dizzy, "swimming", "room spinning" Balance, vision, nausea, dizzy, "swimming, "room spinning"   Quinine Derivatives Nausea Only    dizzy   Sulfa Antibiotics Nausea Only    Also complained of dizziness   Atorvastatin Other (See Comments)    Severe liver function problems   Ephedrine Other (See Comments)    shaking   Other Other (See Comments)    Quinine causes nausea, dizzy   Oxycodone Other (See Comments)    Balance, vision, nausea, dizzy, "swimming", "room spinning"   Quinine Other (See Comments)   Cephalexin Rash and Other (See Comments)   Epinephrine Other (See Comments)    Patient becomes "shaky" shaking shaking   Penicillins Rash    Pt reported all over body rash in 1958 Has patient had a PCN reaction causing immediate rash, facial/tongue/throat swelling, SOB or lightheadedness with hypotension:Yes Has patient had a PCN reaction causing severe rash involving mucus membranes or skin necrosis: No Has patient had a PCN reaction that required hospitalization: No Has patient had a PCN reaction occurring within the last 10 years:No     Theophyllines Other (See Comments)    Patient becomes "shaky"   Current Outpatient Medications on File Prior to Visit  Medication Sig Dispense Refill   albuterol (PROVENTIL) (2.5 MG/3ML) 0.083% nebulizer solution Take 3 mLs (2.5 mg total) by nebulization every 6 (six) hours as needed for wheezing or shortness of breath. 75 mL 12   albuterol (VENTOLIN HFA) 108 (90 Base) MCG/ACT inhaler INHALE 2 PUFFS BY MOUTH INTO THE LUNGS IF NEEDED FOR WHEEZING AND OR SHORTNESS OF BREATH 8.5 g 2   ALLERGY RELIEF 180 MG tablet TAKE 1 TABLET BY MOUTH EVERY DAY 90 tablet 1   allopurinol (ZYLOPRIM) 300 MG tablet Take 300 mg by mouth daily.  1   azelastine (ASTELIN) 0.1 % nasal spray Place 2 sprays into both nostrils daily. Use in each nostril as directed 30 mL 5   azelastine (OPTIVAR) 0.05 % ophthalmic  solution Place 1 drop into both eyes 2 (two) times daily. 6 mL 12   Azelastine HCl 0.15 % SOLN SMARTSIG:1 Spray(s) Both Nares Every Evening     B Complex Vitamins (VITAMIN B COMPLEX PO) Take 1 tablet by mouth daily.     Betamethasone Sodium Phosphate 6 MG/ML SOLN INJECT 2:4 SYRINGE BILATERALLY INTO EACH KNEE JOINT     Biotin 5000 MCG CAPS Take 5,000 mcg by mouth daily.      cholecalciferol (VITAMIN D) 1000 UNITS tablet Take 1,000 Units by mouth daily.     co-enzyme Q-10 50 MG capsule Take 400 mg by mouth daily.     colchicine 0.6 MG tablet Take 0.6 mg by mouth daily as needed (flare  up).      Cyanocobalamin (VITAMIN B-12 PO) Take 1 tablet by mouth daily.     cycloSPORINE (RESTASIS) 0.05 % ophthalmic emulsion Apply to eye as needed.     diclofenac sodium (VOLTAREN) 1 % GEL Apply 4 g topically 4 (four) times daily as needed. 200 g 11   EPINEPHrine 0.3 mg/0.3 mL IJ SOAJ injection Inject into the muscle.     EQL Natural Zinc 50 MG TABS Take 50 mg by mouth daily.     escitalopram (LEXAPRO) 10 MG tablet TAKE 1 TABLET(10 MG) BY MOUTH DAILY 90 tablet 2   fluticasone (FLONASE) 50 MCG/ACT nasal spray Place 1 spray into both nostrils every morning.     furosemide (LASIX) 40 MG tablet Take 1 tablet (40 mg total) by mouth daily. 90 tablet 3   gabapentin (NEURONTIN) 300 MG capsule Take 1 capsule (300 mg total) by mouth 3 (three) times daily. 270 capsule 1   guaiFENesin-dextromethorphan (ROBITUSSIN DM) 100-10 MG/5ML syrup Take 5 mLs by mouth every 6 (six) hours as needed for cough. 118 mL 0   hydrochlorothiazide (HYDRODIURIL) 25 MG tablet TAKE 2 TABLETS(50 MG) BY MOUTH DAILY 180 tablet 1   levalbuterol (XOPENEX HFA) 45 MCG/ACT inhaler Inhale 2 puffs into the lungs every 6 (six) hours as needed for wheezing or shortness of breath. 3 Inhaler 3   levocetirizine (XYZAL) 5 MG tablet Take 1 tablet (5 mg total) by mouth every evening. 30 tablet 11   levothyroxine (SYNTHROID) 75 MCG tablet TAKE 1 TABLET(75 MCG) BY  MOUTH DAILY 90 tablet 1   metoprolol succinate (TOPROL-XL) 25 MG 24 hr tablet TAKE 1/2 TABLET(12.5 MG) BY MOUTH DAILY 45 tablet 3   montelukast (SINGULAIR) 10 MG tablet Take 1 tablet (10 mg total) by mouth at bedtime. 90 tablet 1   mupirocin ointment (BACTROBAN) 2 % Use twice a day as directed 30 g 1   nystatin cream (MYCOSTATIN) Apply 1 application topically daily as needed for dry skin.   0   ondansetron (ZOFRAN) 4 MG tablet Take 1 tablet (4 mg total) by mouth every 8 (eight) hours as needed for nausea or vomiting. 40 tablet 3   Pyridoxine HCl (VITAMIN B-6 PO) Take 1 tablet by mouth daily.     REPATHA PUSHTRONEX SYSTEM 420 MG/3.5ML SOCT INJECT 1 UNITS INTO THE SKIN EVERY 30 DAYS 3.5 mL 11   rivaroxaban (XARELTO) 20 MG TABS tablet TAKE 1 TABLET(20 MG) BY MOUTH DAILY WITH SUPPER 90 tablet 1   rosuvastatin (CRESTOR) 20 MG tablet Take 20 mg by mouth. 1 tab by mouth twice per week     Saccharomyces boulardii (FLORASTOR PO) Take 2 capsules by mouth daily.     tiZANidine (ZANAFLEX) 2 MG tablet Take 1 tablet (2 mg total) by mouth at bedtime. 90 tablet 1   traMADol-acetaminophen (ULTRACET) 37.5-325 MG tablet Take 1-2 tablets by mouth every 6 (six) hours as needed for moderate pain or severe pain.   0   triamcinolone (NASACORT AQ) 55 MCG/ACT AERO nasal inhaler Place 2 sprays daily into the nose. 1 Inhaler 12   triamcinolone ointment (KENALOG) 0.5 % Apply 1 application topically every other day as needed. 30 g 0   valACYclovir (VALTREX) 1000 MG tablet TAKE 2 TABLETS BY MOUTH TWICE DAILY FOR 1 DAY-TAKE DOSES 12 HOURS APART     vitamin C (ASCORBIC ACID) 500 MG tablet Take 500 mg by mouth daily.     VITAMIN E PO Take 1 tablet by mouth daily.  WIXELA INHUB 500-50 MCG/DOSE AEPB Inhale 1 puff into the lungs 2 (two) times daily. 60 each 11   No current facility-administered medications on file prior to visit.        ROS:  All others reviewed and negative.  Objective        PE:  BP 118/74 (BP Location:  Left Arm, Patient Position: Sitting, Cuff Size: Large)    Pulse 81    Temp 98.5 F (36.9 C) (Oral)    Ht $R'5\' 6"'nj$  (1.676 m)    Wt 280 lb (127 kg)    SpO2 95%    BMI 45.19 kg/m                 Constitutional: Pt appears in NAD               HENT: Head: NCAT.                Right Ear: External ear normal.                 Left Ear: External ear normal.                Eyes: . Pupils are equal, round, and reactive to light. Conjunctivae and EOM are normal               Nose: without d/c or deformity               Neck: Neck supple. Gross normal ROM               Cardiovascular: Normal rate and regular rhythm.                 Pulmonary/Chest: Effort normal and breath sounds without rales or wheezing.                Abd:  Soft, NT, ND, + BS, no organomegaly               Neurological: Pt is alert. At baseline orientation, motor grossly intact               Skin: Skin is warm. No rashes, no other new lesions, LE edema - trace bilateral edema               Psychiatric: Pt behavior is normal without agitation   Micro: none  Cardiac tracings I have personally interpreted today:  none  Pertinent Radiological findings (summarize): none   Lab Results  Component Value Date   WBC 7.1 04/19/2021   HGB 13.2 04/19/2021   HCT 40.2 04/19/2021   PLT 164.0 04/19/2021   GLUCOSE 92 04/19/2021   CHOL 168 04/19/2021   TRIG 116.0 04/19/2021   HDL 61.30 04/19/2021   LDLDIRECT 107.0 09/17/2016   LDLCALC 84 04/19/2021   ALT 11 04/19/2021   AST 14 04/19/2021   NA 142 04/19/2021   K 3.4 (L) 04/19/2021   CL 105 04/19/2021   CREATININE 0.80 04/19/2021   BUN 23 04/19/2021   CO2 30 04/19/2021   TSH 7.42 (H) 04/19/2021   HGBA1C 6.1 04/19/2021   Assessment/Plan:  [name] is a 85 y.o. 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  Hypothyroidism Lab Results  Component Value Date   TSH 7.42 (H) 04/19/2021   Mild uncontrolled, asympt, pt to continue levothyroxine no change for now as declines, will f/u lab next visit  Hyperlipidemia Lab Results  Component Value Date   LDLCALC 84 04/19/2021   Mild uncontrolled, pt to continue current statin crestor twice weekly as declines change for now   Essential hypertension BP Readings from Last 3 Encounters:  04/24/21 118/74  03/23/21 (!) 132/92  01/17/21 (!) 142/94   Stable, pt to continue medical treatment hct, toprol   Hyperglycemia Lab Results  Component Value Date   HGBA1C 6.1 04/19/2021   Stable, pt to continue current medical treatment  - diet   Urinary tract infectious disease For suppressive tx - nitrofurantoin 3 x /wk  Followup: Return in about 6 months (around 10/23/2021).  Cathlean Cower, MD 04/27/2021 7:54 PM [facility]

## 2021-04-25 ENCOUNTER — Other Ambulatory Visit: Payer: Self-pay | Admitting: Internal Medicine

## 2021-04-25 NOTE — Telephone Encounter (Signed)
Ok with me. thanks 

## 2021-04-27 ENCOUNTER — Encounter: Payer: Self-pay | Admitting: Internal Medicine

## 2021-04-27 NOTE — Assessment & Plan Note (Signed)
Lab Results  Component Value Date   HGBA1C 6.1 04/19/2021   Stable, pt to continue current medical treatment  - diet

## 2021-04-27 NOTE — Assessment & Plan Note (Signed)
Lab Results  Component Value Date   LDLCALC 84 04/19/2021   Mild uncontrolled, pt to continue current statin crestor twice weekly as declines change for now

## 2021-04-27 NOTE — Assessment & Plan Note (Signed)
For suppressive tx - nitrofurantoin 3 x /wk

## 2021-04-27 NOTE — Assessment & Plan Note (Signed)
Lab Results  Component Value Date   TSH 7.42 (H) 04/19/2021   Mild uncontrolled, asympt, pt to continue levothyroxine no change for now as declines, will f/u lab next visit

## 2021-04-27 NOTE — Assessment & Plan Note (Signed)
BP Readings from Last 3 Encounters:  04/24/21 118/74  03/23/21 (!) 132/92  01/17/21 (!) 142/94   Stable, pt to continue medical treatment hct, toprol

## 2021-05-04 NOTE — Progress Notes (Signed)
NEUROLOGY FOLLOW UP OFFICE NOTE  [name] [id number]  Assessment/Plan:   1.  Gait instability with falls - I believe the primary etiology is the degenerative arthritis in her knees, neuropathy in her feet as a secondary cause 2.  Bilateral hand tremors.  Somewhat parkinsonian but I would not diagnose definite Parkinson's disease.  She does not exhibit any rigidity or bradykinesia.  I think her balance problems are not likely related to potential underlying Parkinson's disease.  3.  Degenerative arthritis in the knees 4.  Polyneuropathy   1.  I would like her to follow up in 12 months to that I may re-evaluate and see if she has had any progression of symptoms that would more likely characterize Parkinson's disease.     Subjective:  [name] is an 85 year old right-handed female with atrial fibrillation, CAD, COPD, HTN, hypothyroidism, and arthritis and history of breast cancer who follows up for re-evaluation of Parkinson's disease.  She is accompanied by her daughter.  UPDATE: Patient presents for re-evaluation of Parkinson's disease.  Last visit, she did exhibit tremor but not enough clinical symptoms to meet criteria for Parkinson's disease.  Reports no change in status.  No falls since last visit but always uses her rolling walker.  Her husband unexpectedly passed away on Christmas Eve.   HISTORY: She reports that she had two mechanical falls in February.  One time, she slid around the doorframe causing her to fall and hit her head.  About a week later, she was having difficulty operating a new but smaller temporary walker (her walker broke).  Her legs got tangled up under it and she fell hitting the back of her head.  She had a probable concussion including headache, dizziness and memory problems.  She had a CT of the head on 08/12/2020, which was personally reviewed and showed mild generalized atrophy and chronic small vessel ischemic changes but no acute abnormality.  She has  unsteady gait.  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  She does report that she sometimes has numbness and tingling in her feet at night, but no pain.  She denies back pain or radicular pain down the legs.  She also has multilevel cervical spondylosis, as demonstrated by cervical X-ray on 06/10/2017.  For 15 years she has had a tremor in both hands.  It causes some difficulty writing but otherwise is manageable.   Labs from May 2022 include TSH 3.82, Hgb A1c 6.1, Vit D 32.84.  She takes supplemental B12.  PAST MEDICAL HISTORY: Past Medical History:  Diagnosis Date   Allergy    Anxiety    Arthritis    no cartlidge in knees   Asthma    Bradycardia    a. atenolol stopped due to this, HR 40s.   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  CAD s/p RCA stent 2000 with residual mild-mod disease of left system 2001.   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 75 mL 12   albuterol (VENTOLIN HFA) 108 (90 Base) MCG/ACT inhaler INHALE 2 PUFFS BY MOUTH INTO THE LUNGS IF NEEDED FOR WHEEZING AND OR SHORTNESS OF BREATH 8.5 g 2   ALLERGY RELIEF 180 MG tablet TAKE 1 TABLET BY MOUTH EVERY DAY 90 tablet 1   allopurinol (ZYLOPRIM) 300 MG tablet Take 300 mg by  mouth daily.  1   ALPRAZolam (XANAX) 0.5 MG tablet TAKE 1 TO 2 TABLETS BY MOUTH TWICE DAILY AS NEEDED FOR ANXIETY OR SLEEP 60 tablet 2   azelastine (ASTELIN) 0.1 % nasal spray Place 2 sprays into both nostrils daily. Use in each nostril as directed 30 mL 5   azelastine (OPTIVAR) 0.05 % ophthalmic solution Place 1 drop into both eyes 2 (two) times daily. 6 mL 12   Azelastine HCl 0.15 % SOLN SMARTSIG:1 Spray(s) Both Nares Every Evening     B Complex Vitamins (VITAMIN B COMPLEX PO) Take 1 tablet by mouth daily.     Betamethasone Sodium Phosphate 6 MG/ML SOLN INJECT 2:4 SYRINGE BILATERALLY INTO EACH KNEE JOINT     Biotin 5000 MCG CAPS Take 5,000 mcg by mouth daily.      cholecalciferol (VITAMIN D) 1000 UNITS tablet Take 1,000 Units by mouth daily.     co-enzyme Q-10 50 MG capsule Take 400 mg by mouth daily.     colchicine 0.6 MG tablet Take 0.6 mg by mouth daily as needed (flare  up).      Cyanocobalamin (VITAMIN B-12 PO) Take 1 tablet by mouth daily.     cycloSPORINE (RESTASIS) 0.05 % ophthalmic emulsion Apply to eye as needed.     diclofenac sodium (VOLTAREN) 1 % GEL Apply 4 g topically 4 (four) times daily as needed. 200 g 11   EPINEPHrine 0.3 mg/0.3 mL IJ SOAJ injection Inject into the muscle.     EQL Natural Zinc 50 MG TABS Take 50 mg by mouth daily.     escitalopram (LEXAPRO) 10 MG tablet TAKE 1 TABLET(10 MG) BY MOUTH DAILY 90 tablet 2   fluticasone (FLONASE) 50 MCG/ACT nasal spray Place 1 spray into both nostrils every morning.     furosemide (LASIX) 40 MG tablet Take 1 tablet (40 mg total) by mouth daily. 90 tablet 3   gabapentin (NEURONTIN) 300 MG capsule Take 1 capsule (300 mg total) by mouth 3 (three) times daily. 270 capsule 1   guaiFENesin-dextromethorphan (ROBITUSSIN DM) 100-10 MG/5ML syrup Take 5 mLs by mouth every 6 (six) hours as needed for cough. 118 mL 0   hydrochlorothiazide (HYDRODIURIL) 25 MG tablet TAKE 2 TABLETS(50 MG) BY MOUTH DAILY 180 tablet 1   levalbuterol (XOPENEX HFA)  45 MCG/ACT inhaler Inhale 2 puffs into the lungs every 6 (six) hours as needed for wheezing or shortness of breath. 3 Inhaler 3   levocetirizine (XYZAL) 5 MG tablet Take 1 tablet (5 mg total) by mouth every evening. 30 tablet 11   levothyroxine (SYNTHROID) 75 MCG tablet TAKE 1 TABLET(75 MCG) BY MOUTH DAILY 90 tablet 1   metoprolol succinate (TOPROL-XL) 25 MG 24 hr tablet TAKE 1/2 TABLET(12.5 MG) BY MOUTH DAILY 45 tablet 3   montelukast (SINGULAIR) 10 MG tablet Take 1 tablet (10 mg total) by  mouth at bedtime. 90 tablet 1   mupirocin ointment (BACTROBAN) 2 % Use twice a day as directed 30 g 1   nitrofurantoin (MACRODANTIN) 50 MG capsule 1 tab by mouth three times per week 36 capsule 3   nystatin cream (MYCOSTATIN) Apply 1 application topically daily as needed for dry skin.   0   ondansetron (ZOFRAN) 4 MG tablet Take 1 tablet (4 mg total) by mouth every 8 (eight) hours as needed for nausea or vomiting. 40 tablet 3   potassium chloride SA (KLOR-CON M) 20 MEQ tablet TAKE 1 TABLET(20 MEQ) BY MOUTH DAILY 90 tablet 3   Pyridoxine HCl (VITAMIN B-6 PO) Take 1 tablet by mouth daily.     REPATHA PUSHTRONEX SYSTEM 420 MG/3.5ML SOCT INJECT 1 UNITS INTO THE SKIN EVERY 30 DAYS 3.5 mL 11   rivaroxaban (XARELTO) 20 MG TABS tablet TAKE 1 TABLET(20 MG) BY MOUTH DAILY WITH SUPPER 90 tablet 1   rosuvastatin (CRESTOR) 20 MG tablet Take 20 mg by mouth. 1 tab by mouth twice per week     Saccharomyces boulardii (FLORASTOR PO) Take 2 capsules by mouth daily.     tiZANidine (ZANAFLEX) 2 MG tablet Take 1 tablet (2 mg total) by mouth at bedtime. 90 tablet 1   traMADol-acetaminophen (ULTRACET) 37.5-325 MG tablet Take 1-2 tablets by mouth every 6 (six) hours as needed for moderate pain or severe pain.   0   triamcinolone (NASACORT AQ) 55 MCG/ACT AERO nasal inhaler Place 2 sprays daily into the nose. 1 Inhaler 12   triamcinolone ointment (KENALOG) 0.5 % Apply 1 application topically every other day as needed. 30 g 0    valACYclovir (VALTREX) 1000 MG tablet TAKE 2 TABLETS BY MOUTH TWICE DAILY FOR 1 DAY-TAKE DOSES 12 HOURS APART     vitamin C (ASCORBIC ACID) 500 MG tablet Take 500 mg by mouth daily.     VITAMIN E PO Take 1 tablet by mouth daily.     WIXELA INHUB 500-50 MCG/DOSE AEPB Inhale 1 puff into the lungs 2 (two) times daily. 60 each 11   No current facility-administered medications on file prior to visit.    ALLERGIES: Allergies  Allergen Reactions   Crestor [Rosuvastatin Calcium]     Severe liver function problems   Hydrocodone-Acetaminophen Anxiety and Itching   Lidocaine Hcl Other (See Comments)    Novacaine:  Becomes very shaky   Lipitor [Atorvastatin Calcium] Other (See Comments)    Elevated liver function    Procaine Other (See Comments)    shaking   Rosuvastatin Other (See Comments)    Severe liver function problems   Theophylline Other (See Comments)    Becomes very shaky skaking skaking   Vicodin [Hydrocodone-Acetaminophen] Itching    Itching all over   Codeine Nausea Only, Anxiety and Other (See Comments)    Also complained of dizziness.  Has same problems with oxycodone and hydrocodone Visual Disturbance, Balance Difficulty, Dizzy "Room Spinning; "Swimming" Balance, vision, nausea, dizzy, "swimming", "room spinning" Balance, vision, nausea, dizzy, "swimming", "room spinning"   Oxycodone-Acetaminophen Nausea Only, Anxiety and Other (See Comments)    Visual Disturbance, Balance Difficulty, Dizzy "Room Spinning; "Swimming"   Propoxyphene Anxiety, Nausea Only and Other (See Comments)    Visual Disturbance, Balance Difficulty, Dizzy "Room Spinning; "Swimming" Balance, vision, nausea, dizzy, "swimming", "room spinning" Balance, vision, nausea, dizzy, "swimming, "room spinning"   Quinine Derivatives Nausea Only    dizzy   Sulfa Antibiotics Nausea Only    Also complained of dizziness  Atorvastatin Other (See Comments)    Severe liver function problems   Ephedrine Other (See  Comments)    shaking   Other Other (See Comments)    Quinine causes nausea, dizzy   Oxycodone Other (See Comments)    Balance, vision, nausea, dizzy, "swimming", "room spinning"   Quinine Other (See Comments)   Cephalexin Rash and Other (See Comments)   Epinephrine Other (See Comments)    Patient becomes "shaky" shaking shaking   Penicillins Rash    Pt reported all over body rash in 1958 Has patient had a PCN reaction causing immediate rash, facial/tongue/throat swelling, SOB or lightheadedness with hypotension:Yes Has patient had a PCN reaction causing severe rash involving mucus membranes or skin necrosis: No Has patient had a PCN reaction that required hospitalization: No Has patient had a PCN reaction occurring within the last 10 years:No     Theophyllines Other (See Comments)    Patient becomes "shaky"    FAMILY HISTORY: Family History  Problem Relation Age of Onset   Heart disease Father    Cancer Father        intestinal polyps   Heart attack Father    Asthma Sister    Heart disease Brother    Heart attack Brother       Objective:  Blood pressure (!) 116/51, pulse 80, resp. rate 20, height 5' 6.5" (1.689 m), weight 282 lb (127.9 kg), SpO2 95 %. General: No acute distress.  Patient appears well-groomed.   Head:  Normocephalic/atraumatic Eyes:  Fundi examined but not visualized Neck: supple, no paraspinal tenderness, full range of motion Heart:  Regular rate and rhythm Lungs:  Clear to auscultation bilaterally Back: No paraspinal tenderness Neurological Exam: Mental Status:  alert and oriented to person, place, and time.  Speech fluent and not dysarthric, language intact.  Cranial Nerves:  CN II-XII intact. Bulk & Tone: normal, no fasciculations.  No rigidity.  Motor:  muscle strength 5/5 throughout.  No bradykinesia.  Intermittent resting, postural and intention tremor in hands.   Finger to nose testing:  Without dysmetria.   Gait:  Broad-based antalgic gait.     Metta Clines, DO  CC: Cathlean Cower, MD

## 2021-05-08 ENCOUNTER — Ambulatory Visit: Payer: Medicare Other | Admitting: Neurology

## 2021-05-08 ENCOUNTER — Encounter: Payer: Self-pay | Admitting: Neurology

## 2021-05-08 ENCOUNTER — Other Ambulatory Visit: Payer: Self-pay

## 2021-05-08 VITALS — BP 116/51 | HR 80 | Resp 20 | Ht 66.5 in | Wt 282.0 lb

## 2021-05-08 DIAGNOSIS — R251 Tremor, unspecified: Secondary | ICD-10-CM

## 2021-05-08 NOTE — Patient Instructions (Addendum)
Follow-up in one year.

## 2021-05-11 ENCOUNTER — Other Ambulatory Visit: Payer: Self-pay

## 2021-05-11 ENCOUNTER — Telehealth (INDEPENDENT_AMBULATORY_CARE_PROVIDER_SITE_OTHER): Payer: Medicare Other | Admitting: Nurse Practitioner

## 2021-05-11 VITALS — Temp 97.6°F

## 2021-05-11 DIAGNOSIS — L0390 Cellulitis, unspecified: Secondary | ICD-10-CM | POA: Diagnosis not present

## 2021-05-11 MED ORDER — CEPHALEXIN 500 MG PO CAPS: 500.0000 mg | ORAL_CAPSULE | Freq: Four times a day (QID) | ORAL | 0 refills | Status: DC

## 2021-05-11 NOTE — Progress Notes (Signed)
An audio/visual tele-health visit was completed today for this patient. I connected with  [name] on 05/11/21 utilizing audio/visual technology and verified that I am speaking with the correct person using two identifiers. The patient was located at their home, and I was located at the office of [facility] during the encounter. I discussed the limitations of evaluation and management by telemedicine. The patient expressed understanding and agreed to proceed.    Subjective:  Patient ID: [name], female    DOB: 1935-06-13  Age: 86 y.o. MRN: [id number]  CC:  Chief Complaint  Patient presents with   Rash      HPI  This patient arrives today for the above.  Symptoms started about 1 week ago.  They started after she accidentally hit her toenail which caused laceration to her toenail.  She tells me she now has redness and swelling in her leg that travels halfway up her shin.  She is also noticed some swelling in her other leg as well.  She does not report significant pain or tenderness at this time.  She tells me she has had cellulitis in the past and it appears similar to these episodes.  She has been using Neosporin topically without much improvement in her symptoms.  She also mentions bilateral red, flat lesions.  She tries to show them to me on the camera today, however the video quality is not good enough for me to be able to see the lesions.  Patient is accompanied by her daughter who is also helping complete the visit.  Past Medical History:  Diagnosis Date   Allergy    Anxiety    Arthritis    no cartlidge in knees   Asthma    Bradycardia    a. atenolol stopped due to this, HR 40s.   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  CAD s/p RCA stent 2000 with residual mild-mod disease of left system 2001.   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   Osteoporosis 03/24/2015   Peripheral neuropathy 03/24/2015   Plantar fasciitis    Sleep apnea       Family History  Problem Relation Age of Onset   Heart disease Father    Cancer Father        intestinal polyps   Heart attack Father    Asthma Sister    Heart disease Brother    Heart attack Brother     Social History   Social History Narrative   Right handed   Social History   Tobacco Use   Smoking status: Former    Packs/day: 1.00    Years: 4.00    Pack years: 4.00    Types: Cigarettes    Quit date: 12/05/1962    Years since quitting: 58.4   Smokeless tobacco: Never  Substance Use Topics   Alcohol use: No     Current Meds  Medication Sig   albuterol (PROVENTIL) (2.5 MG/3ML) 0.083% nebulizer solution Take 3 mLs (2.5 mg total) by nebulization every 6 (  six) hours as needed for wheezing or shortness of breath.   albuterol (VENTOLIN HFA) 108 (90 Base) MCG/ACT inhaler INHALE 2 PUFFS BY MOUTH INTO THE LUNGS IF NEEDED FOR WHEEZING AND OR SHORTNESS OF BREATH   ALLERGY RELIEF 180 MG tablet TAKE 1 TABLET BY MOUTH EVERY DAY   allopurinol (ZYLOPRIM) 300 MG tablet Take 300 mg by mouth daily.   ALPRAZolam (XANAX) 0.5 MG tablet TAKE 1 TO 2 TABLETS BY MOUTH TWICE DAILY AS NEEDED FOR ANXIETY OR SLEEP   azelastine (ASTELIN) 0.1 % nasal spray Place 2 sprays into both nostrils daily. Use in each nostril as directed   azelastine (OPTIVAR) 0.05 % ophthalmic solution Place 1 drop into both eyes 2 (two) times daily.   B Complex Vitamins (VITAMIN B COMPLEX PO) Take 1 tablet by mouth daily.   Biotin 5000 MCG CAPS Take 5,000 mcg by mouth daily.    cephALEXin (KEFLEX) 500 MG capsule Take 1 capsule (500 mg total) by mouth 4 (four)  times daily.   cholecalciferol (VITAMIN D) 1000 UNITS tablet Take 1,000 Units by mouth daily.   co-enzyme Q-10 50 MG capsule Take 400 mg by mouth daily.   colchicine 0.6 MG tablet Take 0.6 mg by mouth daily as needed (flare  up).    Cyanocobalamin (VITAMIN B-12 PO) Take 1 tablet by mouth daily.   cycloSPORINE (RESTASIS) 0.05 % ophthalmic emulsion Apply to eye as needed.   diclofenac sodium (VOLTAREN) 1 % GEL Apply 4 g topically 4 (four) times daily as needed.   EPINEPHrine 0.3 mg/0.3 mL IJ SOAJ injection Inject into the muscle.   EQL Natural Zinc 50 MG TABS Take 50 mg by mouth daily.   escitalopram (LEXAPRO) 10 MG tablet TAKE 1 TABLET(10 MG) BY MOUTH DAILY   fluticasone (FLONASE) 50 MCG/ACT nasal spray Place 1 spray into both nostrils every morning.   furosemide (LASIX) 40 MG tablet Take 1 tablet (40 mg total) by mouth daily.   gabapentin (NEURONTIN) 300 MG capsule Take 1 capsule (300 mg total) by mouth 3 (three) times daily. (Patient taking differently: Take 300 mg by mouth 3 (three) times daily. Only taken one tablet qd)   guaiFENesin-dextromethorphan (ROBITUSSIN DM) 100-10 MG/5ML syrup Take 5 mLs by mouth every 6 (six) hours as needed for cough.   Homeopathic Products (ARNICARE) GEL Apply topically.   hydrochlorothiazide (HYDRODIURIL) 25 MG tablet TAKE 2 TABLETS(50 MG) BY MOUTH DAILY   levocetirizine (XYZAL) 5 MG tablet Take 1 tablet (5 mg total) by mouth every evening.   levothyroxine (SYNTHROID) 75 MCG tablet TAKE 1 TABLET(75 MCG) BY MOUTH DAILY   metoprolol succinate (TOPROL-XL) 25 MG 24 hr tablet TAKE 1/2 TABLET(12.5 MG) BY MOUTH DAILY   montelukast (SINGULAIR) 10 MG tablet Take 1 tablet (10 mg total) by mouth at bedtime.   nitrofurantoin (MACRODANTIN) 50 MG capsule Take 50 mg by mouth. Take three days a week   ondansetron (ZOFRAN) 4 MG tablet Take 1 tablet (4 mg total) by mouth every 8 (eight) hours as needed for nausea or vomiting.   potassium chloride SA (KLOR-CON M) 20 MEQ tablet  TAKE 1 TABLET(20 MEQ) BY MOUTH DAILY   Pyridoxine HCl (VITAMIN B-6 PO) Take 1 tablet by mouth daily.   [place] 420 MG/3.5ML SOCT INJECT 1 UNITS INTO THE SKIN EVERY 30 DAYS   rivaroxaban (XARELTO) 20 MG TABS tablet TAKE 1 TABLET(20 MG) BY MOUTH DAILY WITH SUPPER   rosuvastatin (CRESTOR) 20 MG tablet Take 20 mg by mouth. 1 tab by  mouth twice per week   traMADol-acetaminophen (ULTRACET) 37.5-325 MG tablet Take 1-2 tablets by mouth every 6 (six) hours as needed for moderate pain or severe pain.    triamcinolone (NASACORT AQ) 55 MCG/ACT AERO nasal inhaler Place 2 sprays daily into the nose.   triamcinolone ointment (KENALOG) 0.5 % Apply 1 application topically every other day as needed.   valACYclovir (VALTREX) 1000 MG tablet    vitamin C (ASCORBIC ACID) 500 MG tablet Take 500 mg by mouth daily.   VITAMIN E PO Take 1 tablet by mouth daily.    ROS:  Review of Systems  Constitutional:  Negative for chills and fever.  Cardiovascular:  Positive for leg swelling.  Skin:  Positive for rash.       (+) bilateral lower extremity swelling up to mid shin    Objective:   Today's Vitals: Temp 97.6 F (36.4 C)  Vitals with BMI 05/11/2021 05/08/2021 04/24/2021  Height - 5' 6.5" $Remov'5\' 6"'VKBIqM$   Weight - 282 lbs 280 lbs  BMI - 38.46 65.99  Systolic (No Data) 357 017  Diastolic (No Data) 51 74  Pulse - 80 81     Physical Exam Comprehensive physical exam not completed today as office visit was conducted remotely.  Patient sounds well on video, I am unable to visualize her legs so I cannot see the rash or evaluate the redness to her legs.  I was able to talk her daughter through checking pulse and she tells me that she has bilateral dorsalis pedis pulses that are palpable, and mild pitting edema on bilateral legs.  Patient was alert and oriented, and appeared to have appropriate judgment.       Assessment and Plan   1. Cellulitis, unspecified cellulitis site      Plan: 1.  Concern for  cellulitis so will prescribe her Keflex.  Patient does have this listed in her allergy list but she tells me she is taking Keflex in the recent past and has tolerated it well so she would like to take this again if possible.  We will prescribe it to her today.  We did discuss the rash and that there could be multiple etiologies of this somewhat more severe than others and that if this rash does not improve despite starting antibiotic that she needs to go to the hospital.  She tells me she understands.  We also discussed if the redness starts to spread further up her leg or if she starts to experience significant pain or worsening swelling but she should go to the hospital for those reasons as well.  She tells me she understands.   Tests ordered No orders of the defined types were placed in this encounter.     Meds ordered this encounter  Medications   cephALEXin (KEFLEX) 500 MG capsule    Sig: Take 1 capsule (500 mg total) by mouth 4 (four) times daily.    Dispense:  28 capsule    Refill:  0    Order Specific Question:   Supervising Provider    Answer:   Binnie Rail [id number]    Patient to follow-up as scheduled or sooner as needed.   Ailene Ards, NP

## 2021-05-14 ENCOUNTER — Other Ambulatory Visit: Payer: Self-pay | Admitting: Interventional Cardiology

## 2021-05-14 ENCOUNTER — Telehealth: Payer: Self-pay | Admitting: Internal Medicine

## 2021-05-14 MED ORDER — DOXYCYCLINE HYCLATE 100 MG PO TABS: 100.0000 mg | ORAL_TABLET | Freq: Two times a day (BID) | ORAL | 0 refills | Status: DC

## 2021-05-14 NOTE — Telephone Encounter (Signed)
Xarelto 20mg  refill request received. Pt is 86 years old, weight-127.9kg, Crea-0.80 on 04/19/2021, last seen by Dr. Irish Lack on 06/27/2020 and pending appt on 06/22/2021, Diagnosis-Afib, CrCl-107.2ml/min; Dose is appropriate based on dosing criteria. Will send in refill to requested pharmacy.

## 2021-05-14 NOTE — Telephone Encounter (Signed)
Patient notified of new prescription and where it was sent.

## 2021-05-14 NOTE — Telephone Encounter (Signed)
Ok to stop the cephalexin  Please take all new medication as prescribed = the doxycycline  Ok to take allegra +/- benadryl 50 mg po prn itching, rash, shortness of breath

## 2021-05-14 NOTE — Telephone Encounter (Signed)
Caller connected to [facility] 1.7.2023.   Caller had a video appt yesterday. Caller needs to speak to the doctor. She was provided with a medication that she was allergic to before. She is breaking out in a rash now. She has Cellulitis.  A new antibiotic (cephalexin) for her cellulitis and has a rash now. Little pink dots on her legs. Took an allegra this morning.

## 2021-05-15 NOTE — Telephone Encounter (Signed)
Pt is calling in requesting a Rx to take for her sinuses. Pt woke up this morning with her nose stopped up and its a lot pressure, eye pain. Pt has tried saline to relief the pressure and he didn't help.   Please advise.

## 2021-05-15 NOTE — Telephone Encounter (Signed)
Patient notified

## 2021-05-15 NOTE — Telephone Encounter (Signed)
[place] for mucinex bid prn

## 2021-05-16 DIAGNOSIS — G4733 Obstructive sleep apnea (adult) (pediatric): Secondary | ICD-10-CM | POA: Diagnosis not present

## 2021-05-17 ENCOUNTER — Encounter: Payer: Self-pay | Admitting: Internal Medicine

## 2021-05-17 ENCOUNTER — Other Ambulatory Visit: Payer: Self-pay | Admitting: Internal Medicine

## 2021-05-17 ENCOUNTER — Other Ambulatory Visit: Payer: Self-pay

## 2021-05-17 ENCOUNTER — Ambulatory Visit (INDEPENDENT_AMBULATORY_CARE_PROVIDER_SITE_OTHER): Payer: Medicare Other

## 2021-05-17 ENCOUNTER — Ambulatory Visit (INDEPENDENT_AMBULATORY_CARE_PROVIDER_SITE_OTHER): Payer: Medicare Other | Admitting: Internal Medicine

## 2021-05-17 VITALS — BP 143/71 | HR 70 | Temp 98.4°F | Ht 66.5 in

## 2021-05-17 DIAGNOSIS — I10 Essential (primary) hypertension: Secondary | ICD-10-CM

## 2021-05-17 DIAGNOSIS — R0989 Other specified symptoms and signs involving the circulatory and respiratory systems: Secondary | ICD-10-CM

## 2021-05-17 DIAGNOSIS — R739 Hyperglycemia, unspecified: Secondary | ICD-10-CM | POA: Diagnosis not present

## 2021-05-17 DIAGNOSIS — R509 Fever, unspecified: Secondary | ICD-10-CM

## 2021-05-17 LAB — HEPATIC FUNCTION PANEL
ALT: 12 U/L (ref 0–35)
AST: 16 U/L (ref 0–37)
Albumin: 3.9 g/dL (ref 3.5–5.2)
Alkaline Phosphatase: 48 U/L (ref 39–117)
Bilirubin, Direct: 0.6 mg/dL — ABNORMAL HIGH (ref 0.0–0.3)
Total Bilirubin: 2.6 mg/dL — ABNORMAL HIGH (ref 0.2–1.2)
Total Protein: 6.5 g/dL (ref 6.0–8.3)

## 2021-05-17 LAB — BASIC METABOLIC PANEL
BUN: 18 mg/dL (ref 6–23)
CO2: 28 mEq/L (ref 19–32)
Calcium: 9.2 mg/dL (ref 8.4–10.5)
Chloride: 103 mEq/L (ref 96–112)
Creatinine, Ser: 0.86 mg/dL (ref 0.40–1.20)
GFR: 62.34 mL/min (ref >60.00)
Glucose, Bld: 98 mg/dL (ref 70–99)
Potassium: 2.9 mEq/L — ABNORMAL LOW (ref 3.5–5.1)
Sodium: 140 mEq/L (ref 135–145)

## 2021-05-17 LAB — URINALYSIS, ROUTINE W REFLEX MICROSCOPIC
Bilirubin Urine: NEGATIVE
Ketones, ur: NEGATIVE
Leukocytes,Ua: NEGATIVE
Nitrite: NEGATIVE
Specific Gravity, Urine: 1.015 (ref 1.000–1.030)
Total Protein, Urine: NEGATIVE
Urine Glucose: NEGATIVE
Urobilinogen, UA: 1 (ref 0.0–1.0)
pH: 6 (ref 5.0–8.0)

## 2021-05-17 LAB — CBC WITH DIFFERENTIAL/PLATELET
Basophils Absolute: 0 10*3/uL (ref 0.0–0.1)
Basophils Relative: 0.4 % (ref 0.0–3.0)
Eosinophils Absolute: 0.4 10*3/uL (ref 0.0–0.7)
Eosinophils Relative: 5.2 % — ABNORMAL HIGH (ref 0.0–5.0)
HCT: 36.4 % (ref 36.0–46.0)
Hemoglobin: 11.8 g/dL — ABNORMAL LOW (ref 12.0–15.0)
Lymphocytes Relative: 14 % (ref 12.0–46.0)
Lymphs Abs: 1.1 10*3/uL (ref 0.7–4.0)
MCHC: 32.5 g/dL (ref 30.0–36.0)
MCV: 96.4 fl (ref 78.0–100.0)
Monocytes Absolute: 0.5 10*3/uL (ref 0.1–1.0)
Monocytes Relative: 6.7 % (ref 3.0–12.0)
Neutro Abs: 5.9 10*3/uL (ref 1.4–7.7)
Neutrophils Relative %: 73.7 % (ref 43.0–77.0)
Platelets: 176 10*3/uL (ref 150.0–400.0)
RBC: 3.77 Mil/uL — ABNORMAL LOW (ref 3.87–5.11)
RDW: 15.9 % — ABNORMAL HIGH (ref 11.5–15.5)
WBC: 8 10*3/uL (ref 4.0–10.5)

## 2021-05-17 LAB — POC COVID19 BINAXNOW: SARS Coronavirus 2 Ag: NEGATIVE

## 2021-05-17 MED ORDER — AZITHROMYCIN 250 MG PO TABS: ORAL_TABLET | ORAL | 1 refills | Status: AC

## 2021-05-17 NOTE — Patient Instructions (Signed)
Please continue all other medications as before, and refills have been done if requested.  Please have the pharmacy call with any other refills you may need.  Please continue your efforts at being more active, low cholesterol diet, and weight control.  Please keep your appointments with your specialists as you may have planned  Please go to the XRAY Department in the first floor for the x-ray testing  Please go to the LAB at the blood drawing area for the tests to be done  You will be contacted by phone if any changes need to be made immediately.  Otherwise, you will receive a letter about your results with an explanation, but please check with MyChart first.  Please remember to sign up for MyChart if you have not done so, as this will be important to you in the future with finding out test results, communicating by private email, and scheduling acute appointments online when needed.  

## 2021-05-17 NOTE — Progress Notes (Signed)
Patient ID: [name], female   DOB: Apr 22, 1936, 86 y.o.   MRN: [id number]       Chief Complaint: fever       HPI:  [name] is a 86 y.o. female here with c/o 1 day onset quite temp of 102 last pm, better this am only low grade, with chills last pm but very little other localizing symptoms; Denies urinary symptoms such as dysuria, frequency, urgency, flank pain, hematuria or n/v, fever, chills.  Pt denies chest pain, increased sob or doe, wheezing, orthopnea, PND, increased LE swelling, palpitations, dizziness or syncope, and denies cough but has had mild bronchial congestion.   BP mild high today but usually < 140/90 at home.  She is wondering if fever somehow related to anxiety or afib.  Did also have left leg and great toe cellulitis resolved with doxy course.         Wt Readings from Last 3 Encounters:  05/08/21 282 lb (127.9 kg)  04/24/21 280 lb (127 kg)  03/23/21 280 lb (127 kg)   BP Readings from Last 3 Encounters:  05/17/21 (!) 143/71  05/08/21 (!) 116/51  04/24/21 118/74         Past Medical History:  Diagnosis Date   Allergy    Anxiety    Arthritis    no cartlidge in knees   Asthma    Bradycardia    a. atenolol stopped due to this, HR 40s.   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  CAD s/p RCA stent 2000 with residual mild-mod disease of left system 2001.   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 Her smoking use included cigarettes. She has a 4.00 pack-year smoking history. She has never used smokeless tobacco. She reports that she does not drink alcohol and does not use drugs. family history includes Asthma in her sister; Cancer in her father; Heart attack in her brother and father; Heart disease in her brother and father. Allergies  Allergen Reactions   Crestor [Rosuvastatin Calcium]     Severe liver function problems   Hydrocodone-Acetaminophen Anxiety and Itching   Lidocaine Hcl Other (See Comments)    Novacaine:  Becomes very shaky   Lipitor [Atorvastatin Calcium] Other (See Comments)    Elevated liver function    Procaine Other (See Comments)    shaking   Rosuvastatin Other (See Comments)  Severe liver function problems   Theophylline Other (See Comments)    Becomes very shaky skaking skaking   Vicodin [Hydrocodone-Acetaminophen] Itching    Itching all over   Codeine Nausea Only, Anxiety and Other (See Comments)    Also complained of dizziness.  Has same problems with oxycodone and hydrocodone Visual Disturbance, Balance Difficulty, Dizzy "Room Spinning; "Swimming" Balance, vision, nausea, dizzy, "swimming", "room spinning" Balance, vision, nausea, dizzy, "swimming", "room spinning"   Oxycodone-Acetaminophen Nausea Only, Anxiety and Other (See Comments)    Visual Disturbance, Balance Difficulty, Dizzy "Room Spinning; "Swimming"   Propoxyphene Anxiety, Nausea Only and Other (See Comments)    Visual Disturbance, Balance Difficulty, Dizzy "Room Spinning; "Swimming" Balance, vision, nausea, dizzy, "swimming",  "room spinning" Balance, vision, nausea, dizzy, "swimming, "room spinning"   Quinine Derivatives Nausea Only    dizzy   Sulfa Antibiotics Nausea Only    Also complained of dizziness   Atorvastatin Other (See Comments)    Severe liver function problems   Ephedrine Other (See Comments)    shaking   Other Other (See Comments)    Quinine causes nausea, dizzy   Oxycodone Other (See Comments)    Balance, vision, nausea, dizzy, "swimming", "room spinning"   Quinine Other (See Comments)   Cephalexin Rash and Other (See Comments)   Epinephrine Other (See Comments)    Patient becomes "shaky" shaking shaking   Penicillins Rash    Pt reported all over body rash in 1958 Has patient had a PCN reaction causing immediate rash, facial/tongue/throat swelling, SOB or lightheadedness with hypotension:Yes Has patient had a PCN reaction causing severe rash involving mucus membranes or skin necrosis: No Has patient had a PCN reaction that required hospitalization: No Has patient had a PCN reaction occurring within the last 10 years:No     Theophyllines Other (See Comments)    Patient becomes "shaky"   Current Outpatient Medications on File Prior to Visit  Medication Sig Dispense Refill   albuterol (PROVENTIL) (2.5 MG/3ML) 0.083% nebulizer solution Take 3 mLs (2.5 mg total) by nebulization every 6 (six) hours as needed for wheezing or shortness of breath. 75 mL 12   albuterol (VENTOLIN HFA) 108 (90 Base) MCG/ACT inhaler INHALE 2 PUFFS BY MOUTH INTO THE LUNGS IF NEEDED FOR WHEEZING AND OR SHORTNESS OF BREATH 8.5 g 2   ALLERGY RELIEF 180 MG tablet TAKE 1 TABLET BY MOUTH EVERY DAY 90 tablet 1   allopurinol (ZYLOPRIM) 300 MG tablet Take 300 mg by mouth daily.  1   ALPRAZolam (XANAX) 0.5 MG tablet TAKE 1 TO 2 TABLETS BY MOUTH TWICE DAILY AS NEEDED FOR ANXIETY OR SLEEP 60 tablet 2   azelastine (ASTELIN) 0.1 % nasal spray Place 2 sprays into both nostrils daily. Use in each nostril as directed 30 mL 5    azelastine (OPTIVAR) 0.05 % ophthalmic solution Place 1 drop into both eyes 2 (two) times daily. 6 mL 12   B Complex Vitamins (VITAMIN B COMPLEX PO) Take 1 tablet by mouth daily.     Betamethasone Sodium Phosphate 6 MG/ML SOLN      Biotin 5000 MCG CAPS Take 5,000 mcg by mouth daily.      cholecalciferol (VITAMIN D) 1000 UNITS tablet Take 1,000 Units by mouth daily.     co-enzyme Q-10 50 MG capsule Take 400 mg by mouth daily.     colchicine 0.6 MG tablet Take 0.6 mg by mouth daily as needed (flare  up).      Cyanocobalamin (VITAMIN B-12 PO) Take  1 tablet by mouth daily.     cycloSPORINE (RESTASIS) 0.05 % ophthalmic emulsion Apply to eye as needed.     diclofenac sodium (VOLTAREN) 1 % GEL Apply 4 g topically 4 (four) times daily as needed. 200 g 11   EPINEPHrine 0.3 mg/0.3 mL IJ SOAJ injection Inject into the muscle.     EQL Natural Zinc 50 MG TABS Take 50 mg by mouth daily.     escitalopram (LEXAPRO) 10 MG tablet TAKE 1 TABLET(10 MG) BY MOUTH DAILY 90 tablet 2   fluticasone (FLONASE) 50 MCG/ACT nasal spray Place 1 spray into both nostrils every morning.     furosemide (LASIX) 40 MG tablet Take 1 tablet (40 mg total) by mouth daily. 90 tablet 3   gabapentin (NEURONTIN) 300 MG capsule Take 1 capsule (300 mg total) by mouth 3 (three) times daily. (Patient taking differently: Take 300 mg by mouth 3 (three) times daily. Only taken one tablet qd) 270 capsule 1   guaiFENesin-dextromethorphan (ROBITUSSIN DM) 100-10 MG/5ML syrup Take 5 mLs by mouth every 6 (six) hours as needed for cough. 118 mL 0   Homeopathic Products (ARNICARE) GEL Apply topically.     hydrochlorothiazide (HYDRODIURIL) 25 MG tablet TAKE 2 TABLETS(50 MG) BY MOUTH DAILY 180 tablet 1   levalbuterol (XOPENEX HFA) 45 MCG/ACT inhaler Inhale 2 puffs into the lungs every 6 (six) hours as needed for wheezing or shortness of breath. 3 Inhaler 3   levocetirizine (XYZAL) 5 MG tablet Take 1 tablet (5 mg total) by mouth every evening. 30 tablet 11    levothyroxine (SYNTHROID) 75 MCG tablet TAKE 1 TABLET(75 MCG) BY MOUTH DAILY 90 tablet 1   metoprolol succinate (TOPROL-XL) 25 MG 24 hr tablet TAKE 1/2 TABLET(12.5 MG) BY MOUTH DAILY 45 tablet 3   montelukast (SINGULAIR) 10 MG tablet Take 1 tablet (10 mg total) by mouth at bedtime. 90 tablet 1   mupirocin ointment (BACTROBAN) 2 % Use twice a day as directed 30 g 1   nitrofurantoin (MACRODANTIN) 50 MG capsule Take 50 mg by mouth. Take three days a week     nystatin cream (MYCOSTATIN) Apply 1 application topically daily as needed for dry skin.   0   ondansetron (ZOFRAN) 4 MG tablet Take 1 tablet (4 mg total) by mouth every 8 (eight) hours as needed for nausea or vomiting. 40 tablet 3   potassium chloride SA (KLOR-CON M) 20 MEQ tablet TAKE 1 TABLET(20 MEQ) BY MOUTH DAILY 90 tablet 3   Pyridoxine HCl (VITAMIN B-6 PO) Take 1 tablet by mouth daily.     [place] 420 MG/3.5ML SOCT INJECT 1 UNITS INTO THE SKIN EVERY 30 DAYS 3.5 mL 11   rosuvastatin (CRESTOR) 20 MG tablet Take 20 mg by mouth. 1 tab by mouth twice per week     Saccharomyces boulardii (FLORASTOR PO) Take 2 capsules by mouth daily.     traMADol-acetaminophen (ULTRACET) 37.5-325 MG tablet Take 1-2 tablets by mouth every 6 (six) hours as needed for moderate pain or severe pain.   0   triamcinolone (NASACORT AQ) 55 MCG/ACT AERO nasal inhaler Place 2 sprays daily into the nose. 1 Inhaler 12   triamcinolone ointment (KENALOG) 0.5 % Apply 1 application topically every other day as needed. 30 g 0   valACYclovir (VALTREX) 1000 MG tablet      vitamin C (ASCORBIC ACID) 500 MG tablet Take 500 mg by mouth daily.     VITAMIN E PO Take 1 tablet by mouth daily.  WIXELA INHUB 500-50 MCG/DOSE AEPB Inhale 1 puff into the lungs 2 (two) times daily. 60 each 11   XARELTO 20 MG TABS tablet TAKE 1 TABLET(20 MG) BY MOUTH DAILY WITH SUPPER 90 tablet 1   cephALEXin (KEFLEX) 500 MG capsule Take 1 capsule (500 mg total) by mouth 4 (four) times daily.  (Patient not taking: Reported on 05/17/2021) 28 capsule 0   doxycycline (VIBRA-TABS) 100 MG tablet Take 1 tablet (100 mg total) by mouth 2 (two) times daily. (Patient not taking: Reported on 05/17/2021) 20 tablet 0   No current facility-administered medications on file prior to visit.        ROS:  All others reviewed and negative.  Objective        PE:  BP (!) 143/71 (BP Location: Right Arm, Patient Position: Sitting, Cuff Size: Large)    Pulse 70    Temp 98.4 F (36.9 C) (Oral)    Ht 5' 6.5" (1.689 m)    SpO2 94%    BMI 44.83 kg/m                 Constitutional: Pt appears in NAD               HENT: Head: NCAT.                Right Ear: External ear normal.                 Left Ear: External ear normal.                Eyes: . Pupils are equal, round, and reactive to light. Conjunctivae and EOM are normal               Nose: without d/c or deformity               Neck: Neck supple. Gross normal ROM               Cardiovascular: Normal rate and regular rhythm.                 Pulmonary/Chest: Effort normal and breath sounds without rales or wheezing.                Abd:  Soft, NT, ND, + BS, no organomegaly               Neurological: Pt is alert. At baseline orientation, motor grossly intact               Skin: Skin is warm. No rashes, no other new lesions, LE edema - chronic 1+               Psychiatric: Pt behavior is normal without agitation   Micro: none  Cardiac tracings I have personally interpreted today:  none  Pertinent Radiological findings (summarize): none   Lab Results  Component Value Date   WBC 8.0 05/17/2021   HGB 11.8 (L) 05/17/2021   HCT 36.4 05/17/2021   PLT 176.0 05/17/2021   GLUCOSE 98 05/17/2021   CHOL 168 04/19/2021   TRIG 116.0 04/19/2021   HDL 61.30 04/19/2021   LDLDIRECT 107.0 09/17/2016   LDLCALC 84 04/19/2021   ALT 12 05/17/2021   AST 16 05/17/2021   NA 140 05/17/2021   K 2.9 (L) 05/17/2021   CL 103 05/17/2021   CREATININE 0.86 05/17/2021    BUN 18 05/17/2021   CO2 28 05/17/2021   TSH 7.42 (H) 04/19/2021   HGBA1C 6.1 04/19/2021  SARS Coronavirus 2 Ag Negative Negative    Assessment/Plan:  [name] is a 86 y.o. White or Caucasian [1] female with  has a past medical history of Allergy, Anxiety, Arthritis, Asthma, Bradycardia, Breast cancer ([place]) Receptor + her2 _ (12/26/2010), CAD in native artery, Chronic atrial fibrillation ([place]), COPD (chronic obstructive pulmonary disease) ([place]), Essential hypertension (03/24/2015), Gout, radiation therapy (04/02/11 -05/20/11), Hyperlipidemia, Hypothyroidism (03/24/2015), Incontinence of urine, Malignant neoplasm of upper-outer quadrant of female breast ([place]) (12/26/2010), Osteoporosis (03/24/2015), Peripheral neuropathy (03/24/2015), Plantar fasciitis, and Sleep apnea.  Febrile illness Etiology unclear, exam benign but for cxr, UA, cbc, blood culture, with tx pending results  Hypertension BP Readings from Last 3 Encounters:  05/17/21 (!) 143/71  05/08/21 (!) 116/51  04/24/21 118/74   Mild uncontrolled here, but ok at home per pt,, pt to continue medical treatment hct, toprol   Hyperglycemia Lab Results  Component Value Date   HGBA1C 6.1 04/19/2021   Stable, pt to continue current medical treatment  - diet  Followup: Return if symptoms worsen or fail to improve.  Cathlean Cower, MD 05/22/2021 9:11 PM [facility]

## 2021-05-22 ENCOUNTER — Encounter: Payer: Self-pay | Admitting: Internal Medicine

## 2021-05-22 DIAGNOSIS — R509 Fever, unspecified: Secondary | ICD-10-CM | POA: Insufficient documentation

## 2021-05-22 NOTE — Assessment & Plan Note (Signed)
BP Readings from Last 3 Encounters:  05/17/21 (!) 143/71  05/08/21 (!) 116/51  04/24/21 118/74   Mild uncontrolled here, but ok at home per pt,, pt to continue medical treatment hct, toprol

## 2021-05-22 NOTE — Assessment & Plan Note (Signed)
Etiology unclear, exam benign but for cxr, UA, cbc, blood culture, with tx pending results

## 2021-05-22 NOTE — Assessment & Plan Note (Signed)
Lab Results  Component Value Date   HGBA1C 6.1 04/19/2021   Stable, pt to continue current medical treatment  - diet

## 2021-05-23 LAB — URINE CULTURE

## 2021-05-23 LAB — CULTURE, BLOOD (SINGLE)

## 2021-05-26 ENCOUNTER — Encounter: Payer: Self-pay | Admitting: Internal Medicine

## 2021-05-29 MED ORDER — ALBUTEROL SULFATE HFA 108 (90 BASE) MCG/ACT IN AERS: INHALATION_SPRAY | RESPIRATORY_TRACT | 11 refills | Status: DC

## 2021-05-31 ENCOUNTER — Telehealth: Payer: Self-pay | Admitting: Internal Medicine

## 2021-05-31 NOTE — Telephone Encounter (Signed)
Ok to continue to watch for now as the most recent urine culture was negative; continue to monitor for any worsening fever, pain, cough or urinary symptoms

## 2021-05-31 NOTE — Telephone Encounter (Signed)
Patient states she was recently treated for a bladder infection, patient states she experiencing dizziness and chills  Patient states she was prescribed azithromycin for bladder infection and hoping provider will prescribed it again  Offered pt appt, pt declined  Pt requesting a c/b

## 2021-05-31 NOTE — Telephone Encounter (Signed)
Patient notified and verbalizes understanding.

## 2021-05-31 NOTE — Telephone Encounter (Signed)
Spoke with patient and she states that she has completed the course antibiotics for bladder infection. Now has dizziness and chills that started last night. Per patient no other symptoms. Advised patient that typically the same antibiotic is not sent in again and that she would most likely need to be seen again for further treatment. Please advise

## 2021-06-01 DIAGNOSIS — M25571 Pain in right ankle and joints of right foot: Secondary | ICD-10-CM | POA: Diagnosis not present

## 2021-06-01 DIAGNOSIS — M25572 Pain in left ankle and joints of left foot: Secondary | ICD-10-CM | POA: Diagnosis not present

## 2021-06-01 DIAGNOSIS — M1711 Unilateral primary osteoarthritis, right knee: Secondary | ICD-10-CM | POA: Diagnosis not present

## 2021-06-01 DIAGNOSIS — M1712 Unilateral primary osteoarthritis, left knee: Secondary | ICD-10-CM | POA: Diagnosis not present

## 2021-06-02 ENCOUNTER — Encounter: Payer: Self-pay | Admitting: Interventional Cardiology

## 2021-06-04 NOTE — Telephone Encounter (Signed)
Jettie Booze, MD  You 7 minutes ago (8:48 AM)   Her potassium was quite low on January 12.  Perhaps Dr. Jenny Reichmann is rechecking.  BMet should be rechecked if not already ordered.

## 2021-06-05 ENCOUNTER — Other Ambulatory Visit: Payer: Self-pay | Admitting: *Deleted

## 2021-06-05 DIAGNOSIS — E875 Hyperkalemia: Secondary | ICD-10-CM

## 2021-06-06 ENCOUNTER — Other Ambulatory Visit: Payer: Self-pay

## 2021-06-06 ENCOUNTER — Telehealth: Payer: Self-pay | Admitting: Interventional Cardiology

## 2021-06-06 ENCOUNTER — Other Ambulatory Visit: Payer: Medicare Other | Admitting: *Deleted

## 2021-06-06 DIAGNOSIS — E875 Hyperkalemia: Secondary | ICD-10-CM

## 2021-06-06 NOTE — Telephone Encounter (Signed)
Attempted to contact pt to discuss her chest pain concerns.  LM to CB.

## 2021-06-06 NOTE — Telephone Encounter (Signed)
[name] came in for her lab work on 06/06/21 and asked if a nurse could do an ekg on her while in the office, I know we do not do walk ins so I am taking this note. She stated she has been having some chest pain during the night for the past 2 weeks. She complained of no other symptoms other than the chest pain. Will someone please call her about this, potentially wants an earlier appt if possible. Call @ [phone]

## 2021-06-07 LAB — BASIC METABOLIC PANEL
BUN/Creatinine Ratio: 19 (ref 12–28)
BUN: 14 mg/dL (ref 8–27)
CO2: 24 mmol/L (ref 20–29)
Calcium: 9.3 mg/dL (ref 8.7–10.3)
Chloride: 107 mmol/L — ABNORMAL HIGH (ref 96–106)
Creatinine, Ser: 0.74 mg/dL (ref 0.57–1.00)
Glucose: 93 mg/dL (ref 70–99)
Potassium: 3.5 mmol/L (ref 3.5–5.2)
Sodium: 147 mmol/L — ABNORMAL HIGH (ref 134–144)
eGFR: 80 mL/min/{1.73_m2} (ref >59)

## 2021-06-08 ENCOUNTER — Ambulatory Visit: Payer: Medicare Other | Admitting: Interventional Cardiology

## 2021-06-11 ENCOUNTER — Other Ambulatory Visit: Payer: Self-pay | Admitting: Internal Medicine

## 2021-06-11 ENCOUNTER — Other Ambulatory Visit: Payer: Self-pay | Admitting: Interventional Cardiology

## 2021-06-11 NOTE — Telephone Encounter (Signed)
Please refill as per office routine med refill policy (all routine meds to be refilled for 3 mo or monthly (per pt preference) up to one year from last visit, then month to month grace period for 3 mo, then further med refills will have to be denied) ? ?

## 2021-06-12 ENCOUNTER — Inpatient Hospital Stay (HOSPITAL_COMMUNITY)
Admission: EM | Admit: 2021-06-12 | Discharge: 2021-06-16 | DRG: 193 | Disposition: A | Discharge status: Home Health | Payer: Medicare Other | Attending: Family Medicine | Admitting: Family Medicine

## 2021-06-12 ENCOUNTER — Emergency Department (HOSPITAL_COMMUNITY): Payer: Medicare Other

## 2021-06-12 ENCOUNTER — Ambulatory Visit (HOSPITAL_COMMUNITY)
Admission: EM | Admit: 2021-06-12 | Discharge: 2021-06-12 | Disposition: A | Discharge status: Home / Self Care | Payer: Medicare Other | Attending: Family Medicine | Admitting: Family Medicine

## 2021-06-12 ENCOUNTER — Other Ambulatory Visit: Payer: Self-pay

## 2021-06-12 ENCOUNTER — Encounter (HOSPITAL_COMMUNITY): Payer: Self-pay

## 2021-06-12 ENCOUNTER — Ambulatory Visit (INDEPENDENT_AMBULATORY_CARE_PROVIDER_SITE_OTHER): Payer: Medicare Other

## 2021-06-12 ENCOUNTER — Telehealth: Payer: Self-pay | Admitting: Internal Medicine

## 2021-06-12 ENCOUNTER — Encounter (HOSPITAL_COMMUNITY): Payer: Self-pay | Admitting: Emergency Medicine

## 2021-06-12 DIAGNOSIS — J189 Pneumonia, unspecified organism: Principal | ICD-10-CM

## 2021-06-12 DIAGNOSIS — I248 Other forms of acute ischemic heart disease: Secondary | ICD-10-CM | POA: Diagnosis not present

## 2021-06-12 DIAGNOSIS — R509 Fever, unspecified: Secondary | ICD-10-CM | POA: Diagnosis present

## 2021-06-12 DIAGNOSIS — Z888 Allergy status to other drugs, medicaments and biological substances status: Secondary | ICD-10-CM

## 2021-06-12 DIAGNOSIS — E039 Hypothyroidism, unspecified: Secondary | ICD-10-CM | POA: Diagnosis present

## 2021-06-12 DIAGNOSIS — Z6841 Body Mass Index (BMI) 40.0 and over, adult: Secondary | ICD-10-CM

## 2021-06-12 DIAGNOSIS — Z882 Allergy status to sulfonamides status: Secondary | ICD-10-CM

## 2021-06-12 DIAGNOSIS — R0902 Hypoxemia: Secondary | ICD-10-CM

## 2021-06-12 DIAGNOSIS — E876 Hypokalemia: Secondary | ICD-10-CM | POA: Diagnosis present

## 2021-06-12 DIAGNOSIS — I517 Cardiomegaly: Secondary | ICD-10-CM | POA: Diagnosis not present

## 2021-06-12 DIAGNOSIS — I2510 Atherosclerotic heart disease of native coronary artery without angina pectoris: Secondary | ICD-10-CM | POA: Diagnosis not present

## 2021-06-12 DIAGNOSIS — R0602 Shortness of breath: Secondary | ICD-10-CM | POA: Diagnosis not present

## 2021-06-12 DIAGNOSIS — Z825 Family history of asthma and other chronic lower respiratory diseases: Secondary | ICD-10-CM

## 2021-06-12 DIAGNOSIS — J45901 Unspecified asthma with (acute) exacerbation: Secondary | ICD-10-CM | POA: Diagnosis present

## 2021-06-12 DIAGNOSIS — M170 Bilateral primary osteoarthritis of knee: Secondary | ICD-10-CM | POA: Diagnosis not present

## 2021-06-12 DIAGNOSIS — Z885 Allergy status to narcotic agent status: Secondary | ICD-10-CM | POA: Diagnosis not present

## 2021-06-12 DIAGNOSIS — M109 Gout, unspecified: Secondary | ICD-10-CM | POA: Diagnosis not present

## 2021-06-12 DIAGNOSIS — J9601 Acute respiratory failure with hypoxia: Secondary | ICD-10-CM | POA: Diagnosis present

## 2021-06-12 DIAGNOSIS — R079 Chest pain, unspecified: Secondary | ICD-10-CM | POA: Diagnosis not present

## 2021-06-12 DIAGNOSIS — I10 Essential (primary) hypertension: Secondary | ICD-10-CM | POA: Diagnosis present

## 2021-06-12 DIAGNOSIS — R7989 Other specified abnormal findings of blood chemistry: Secondary | ICD-10-CM | POA: Diagnosis present

## 2021-06-12 DIAGNOSIS — Z880 Allergy status to penicillin: Secondary | ICD-10-CM | POA: Diagnosis not present

## 2021-06-12 DIAGNOSIS — J441 Chronic obstructive pulmonary disease with (acute) exacerbation: Secondary | ICD-10-CM | POA: Diagnosis not present

## 2021-06-12 DIAGNOSIS — G4733 Obstructive sleep apnea (adult) (pediatric): Secondary | ICD-10-CM

## 2021-06-12 DIAGNOSIS — Z853 Personal history of malignant neoplasm of breast: Secondary | ICD-10-CM

## 2021-06-12 DIAGNOSIS — I4811 Longstanding persistent atrial fibrillation: Secondary | ICD-10-CM

## 2021-06-12 DIAGNOSIS — I214 Non-ST elevation (NSTEMI) myocardial infarction: Secondary | ICD-10-CM

## 2021-06-12 DIAGNOSIS — E782 Mixed hyperlipidemia: Secondary | ICD-10-CM | POA: Diagnosis present

## 2021-06-12 DIAGNOSIS — R0600 Dyspnea, unspecified: Secondary | ICD-10-CM | POA: Diagnosis present

## 2021-06-12 DIAGNOSIS — R739 Hyperglycemia, unspecified: Secondary | ICD-10-CM | POA: Diagnosis not present

## 2021-06-12 DIAGNOSIS — Z20822 Contact with and (suspected) exposure to covid-19: Secondary | ICD-10-CM | POA: Diagnosis present

## 2021-06-12 DIAGNOSIS — Z7901 Long term (current) use of anticoagulants: Secondary | ICD-10-CM

## 2021-06-12 DIAGNOSIS — K449 Diaphragmatic hernia without obstruction or gangrene: Secondary | ICD-10-CM | POA: Diagnosis not present

## 2021-06-12 DIAGNOSIS — J4531 Mild persistent asthma with (acute) exacerbation: Secondary | ICD-10-CM

## 2021-06-12 DIAGNOSIS — J9811 Atelectasis: Secondary | ICD-10-CM | POA: Diagnosis not present

## 2021-06-12 DIAGNOSIS — R7303 Prediabetes: Secondary | ICD-10-CM | POA: Diagnosis present

## 2021-06-12 DIAGNOSIS — Z79899 Other long term (current) drug therapy: Secondary | ICD-10-CM

## 2021-06-12 DIAGNOSIS — R778 Other specified abnormalities of plasma proteins: Secondary | ICD-10-CM | POA: Diagnosis present

## 2021-06-12 DIAGNOSIS — J440 Chronic obstructive pulmonary disease with acute lower respiratory infection: Secondary | ICD-10-CM | POA: Diagnosis not present

## 2021-06-12 DIAGNOSIS — Z8249 Family history of ischemic heart disease and other diseases of the circulatory system: Secondary | ICD-10-CM

## 2021-06-12 DIAGNOSIS — Z923 Personal history of irradiation: Secondary | ICD-10-CM

## 2021-06-12 DIAGNOSIS — N200 Calculus of kidney: Secondary | ICD-10-CM | POA: Diagnosis not present

## 2021-06-12 DIAGNOSIS — Z79890 Hormone replacement therapy (postmenopausal): Secondary | ICD-10-CM

## 2021-06-12 DIAGNOSIS — T380X5A Adverse effect of glucocorticoids and synthetic analogues, initial encounter: Secondary | ICD-10-CM | POA: Diagnosis not present

## 2021-06-12 DIAGNOSIS — Z955 Presence of coronary angioplasty implant and graft: Secondary | ICD-10-CM

## 2021-06-12 DIAGNOSIS — J069 Acute upper respiratory infection, unspecified: Secondary | ICD-10-CM

## 2021-06-12 DIAGNOSIS — R059 Cough, unspecified: Secondary | ICD-10-CM

## 2021-06-12 DIAGNOSIS — I4891 Unspecified atrial fibrillation: Secondary | ICD-10-CM | POA: Diagnosis present

## 2021-06-12 DIAGNOSIS — F419 Anxiety disorder, unspecified: Secondary | ICD-10-CM | POA: Diagnosis present

## 2021-06-12 DIAGNOSIS — Z87891 Personal history of nicotine dependence: Secondary | ICD-10-CM

## 2021-06-12 DIAGNOSIS — I482 Chronic atrial fibrillation, unspecified: Secondary | ICD-10-CM | POA: Diagnosis present

## 2021-06-12 LAB — RESP PANEL BY RT-PCR (FLU A&B, COVID) ARPGX2
Influenza A by PCR: NEGATIVE
Influenza B by PCR: NEGATIVE
SARS Coronavirus 2 by RT PCR: NEGATIVE

## 2021-06-12 LAB — COMPREHENSIVE METABOLIC PANEL
ALT: 15 U/L (ref 0–44)
AST: 29 U/L (ref 15–41)
Albumin: 3.5 g/dL (ref 3.5–5.0)
Alkaline Phosphatase: 46 U/L (ref 38–126)
Anion gap: 9 (ref 5–15)
BUN: 21 mg/dL (ref 8–23)
CO2: 25 mmol/L (ref 22–32)
Calcium: 8.9 mg/dL (ref 8.9–10.3)
Chloride: 100 mmol/L (ref 98–111)
Creatinine, Ser: 0.89 mg/dL (ref 0.44–1.00)
GFR, Estimated: >60 mL/min (ref >60)
Glucose, Bld: 104 mg/dL — ABNORMAL HIGH (ref 70–99)
Potassium: 3.3 mmol/L — ABNORMAL LOW (ref 3.5–5.1)
Sodium: 134 mmol/L — ABNORMAL LOW (ref 135–145)
Total Bilirubin: 2.6 mg/dL — ABNORMAL HIGH (ref 0.3–1.2)
Total Protein: 6.7 g/dL (ref 6.5–8.1)

## 2021-06-12 LAB — BLOOD GAS, VENOUS
Acid-Base Excess: 2.7 mmol/L — ABNORMAL HIGH (ref 0.0–2.0)
Bicarbonate: 27.5 mmol/L (ref 20.0–28.0)
O2 Saturation: 58.1 %
Patient temperature: 98.6
pCO2, Ven: 45.9 mmHg (ref 44.0–60.0)
pH, Ven: 7.395 (ref 7.250–7.430)
pO2, Ven: 34.2 mmHg (ref 32.0–45.0)

## 2021-06-12 LAB — CBC WITH DIFFERENTIAL/PLATELET
Abs Immature Granulocytes: 0.06 10*3/uL (ref 0.00–0.07)
Basophils Absolute: 0 10*3/uL (ref 0.0–0.1)
Basophils Relative: 0 %
Eosinophils Absolute: 0.1 10*3/uL (ref 0.0–0.5)
Eosinophils Relative: 1 %
HCT: 36.8 % (ref 36.0–46.0)
Hemoglobin: 11.9 g/dL — ABNORMAL LOW (ref 12.0–15.0)
Immature Granulocytes: 1 %
Lymphocytes Relative: 10 %
Lymphs Abs: 1.2 10*3/uL (ref 0.7–4.0)
MCH: 32.2 pg (ref 26.0–34.0)
MCHC: 32.3 g/dL (ref 30.0–36.0)
MCV: 99.7 fL (ref 80.0–100.0)
Monocytes Absolute: 1.1 10*3/uL — ABNORMAL HIGH (ref 0.1–1.0)
Monocytes Relative: 10 %
Neutro Abs: 8.7 10*3/uL — ABNORMAL HIGH (ref 1.7–7.7)
Neutrophils Relative %: 78 %
Platelets: 159 10*3/uL (ref 150–400)
RBC: 3.69 MIL/uL — ABNORMAL LOW (ref 3.87–5.11)
RDW: 15.5 % (ref 11.5–15.5)
WBC: 11.2 10*3/uL — ABNORMAL HIGH (ref 4.0–10.5)
nRBC: 0 % (ref 0.0–0.2)

## 2021-06-12 LAB — POCT URINALYSIS DIPSTICK, ED / UC
Glucose, UA: NEGATIVE mg/dL
Leukocytes,Ua: NEGATIVE
Nitrite: NEGATIVE
Protein, ur: NEGATIVE mg/dL
Specific Gravity, Urine: 1.02 (ref 1.005–1.030)
Urobilinogen, UA: 0.2 mg/dL (ref 0.0–1.0)
pH: 5.5 (ref 5.0–8.0)

## 2021-06-12 LAB — APTT: aPTT: 35 seconds (ref 24–36)

## 2021-06-12 LAB — D-DIMER, QUANTITATIVE: D-Dimer, Quant: 0.67 ug/mL-FEU — ABNORMAL HIGH (ref 0.00–0.50)

## 2021-06-12 LAB — BRAIN NATRIURETIC PEPTIDE: B Natriuretic Peptide: 143.5 pg/mL — ABNORMAL HIGH (ref 0.0–100.0)

## 2021-06-12 LAB — HEPARIN LEVEL (UNFRACTIONATED): Heparin Unfractionated: >1.1 IU/mL — ABNORMAL HIGH (ref 0.30–0.70)

## 2021-06-12 LAB — CBG MONITORING, ED: Glucose-Capillary: 97 mg/dL (ref 70–99)

## 2021-06-12 LAB — LIPASE, BLOOD: Lipase: 25 U/L (ref 11–51)

## 2021-06-12 LAB — TROPONIN I (HIGH SENSITIVITY): Troponin I (High Sensitivity): 133 ng/L (ref <18)

## 2021-06-12 MED ORDER — METHYLPREDNISOLONE SODIUM SUCC 125 MG IJ SOLR
60.0000 mg | Freq: Once | INTRAMUSCULAR | Status: AC
  Administered 2021-06-12: 60 mg via INTRAVENOUS
  Filled 2021-06-12: qty 2

## 2021-06-12 MED ORDER — HEPARIN (PORCINE) 25000 UT/250ML-% IV SOLN
1200.0000 [IU]/h | INTRAVENOUS | Status: DC
  Administered 2021-06-13 (×2): 1200 [IU]/h via INTRAVENOUS
  Filled 2021-06-12 (×2): qty 250

## 2021-06-12 MED ORDER — GABAPENTIN 300 MG PO CAPS
300.0000 mg | ORAL_CAPSULE | Freq: Every day | ORAL | Status: DC
  Administered 2021-06-12 – 2021-06-15 (×4): 300 mg via ORAL
  Filled 2021-06-12 (×4): qty 1

## 2021-06-12 MED ORDER — LEVOCETIRIZINE DIHYDROCHLORIDE 5 MG PO TABS: 5.0000 mg | ORAL_TABLET | Freq: Every evening | ORAL | Status: DC

## 2021-06-12 MED ORDER — MOMETASONE FURO-FORMOTEROL FUM 200-5 MCG/ACT IN AERO
2.0000 | INHALATION_SPRAY | Freq: Two times a day (BID) | RESPIRATORY_TRACT | Status: DC
  Administered 2021-06-13 – 2021-06-16 (×6): 2 via RESPIRATORY_TRACT
  Filled 2021-06-12: qty 8.8

## 2021-06-12 MED ORDER — IPRATROPIUM-ALBUTEROL 0.5-2.5 (3) MG/3ML IN SOLN
RESPIRATORY_TRACT | Status: AC
  Filled 2021-06-12: qty 3

## 2021-06-12 MED ORDER — ALBUTEROL SULFATE (2.5 MG/3ML) 0.083% IN NEBU: 2.5000 mg | INHALATION_SOLUTION | RESPIRATORY_TRACT | Status: DC | PRN

## 2021-06-12 MED ORDER — LEVOFLOXACIN IN D5W 750 MG/150ML IV SOLN
750.0000 mg | INTRAVENOUS | Status: DC
  Administered 2021-06-13 – 2021-06-14 (×2): 750 mg via INTRAVENOUS
  Filled 2021-06-12 (×2): qty 150

## 2021-06-12 MED ORDER — ALLOPURINOL 300 MG PO TABS
300.0000 mg | ORAL_TABLET | Freq: Every day | ORAL | Status: DC
  Administered 2021-06-14 – 2021-06-16 (×3): 300 mg via ORAL
  Filled 2021-06-12 (×4): qty 1

## 2021-06-12 MED ORDER — LEVOFLOXACIN IN D5W 750 MG/150ML IV SOLN
750.0000 mg | Freq: Once | INTRAVENOUS | Status: AC
  Administered 2021-06-12: 750 mg via INTRAVENOUS
  Filled 2021-06-12: qty 150

## 2021-06-12 MED ORDER — PREDNISONE 20 MG PO TABS
40.0000 mg | ORAL_TABLET | Freq: Every day | ORAL | Status: DC
  Administered 2021-06-13 – 2021-06-16 (×4): 40 mg via ORAL
  Filled 2021-06-12 (×5): qty 2

## 2021-06-12 MED ORDER — CYCLOSPORINE 0.05 % OP EMUL
1.0000 [drp] | Freq: Two times a day (BID) | OPHTHALMIC | Status: DC | PRN
  Filled 2021-06-12: qty 30

## 2021-06-12 MED ORDER — IPRATROPIUM-ALBUTEROL 0.5-2.5 (3) MG/3ML IN SOLN
3.0000 mL | Freq: Once | RESPIRATORY_TRACT | Status: AC
  Administered 2021-06-12: 3 mL via RESPIRATORY_TRACT

## 2021-06-12 MED ORDER — ROSUVASTATIN CALCIUM 20 MG PO TABS
20.0000 mg | ORAL_TABLET | ORAL | Status: DC
  Administered 2021-06-14: 20 mg via ORAL
  Filled 2021-06-12: qty 1

## 2021-06-12 MED ORDER — ASPIRIN 325 MG PO TABS
325.0000 mg | ORAL_TABLET | Freq: Once | ORAL | Status: AC
  Administered 2021-06-12: 325 mg via ORAL
  Filled 2021-06-12: qty 1

## 2021-06-12 MED ORDER — LORATADINE 10 MG PO TABS
10.0000 mg | ORAL_TABLET | Freq: Every day | ORAL | Status: DC
Start: 2021-06-12 — End: 2021-06-16
  Administered 2021-06-12 – 2021-06-15 (×4): 10 mg via ORAL
  Filled 2021-06-12 (×4): qty 1

## 2021-06-12 MED ORDER — ASPIRIN 325 MG PO TABS: 325.0000 mg | ORAL_TABLET | Freq: Every day | ORAL | Status: DC

## 2021-06-12 MED ORDER — POTASSIUM CHLORIDE CRYS ER 20 MEQ PO TBCR
20.0000 meq | EXTENDED_RELEASE_TABLET | Freq: Once | ORAL | Status: AC
  Administered 2021-06-12: 20 meq via ORAL
  Filled 2021-06-12: qty 1

## 2021-06-12 MED ORDER — UMECLIDINIUM BROMIDE 62.5 MCG/ACT IN AEPB
1.0000 | INHALATION_SPRAY | Freq: Every day | RESPIRATORY_TRACT | Status: DC
  Administered 2021-06-13 – 2021-06-16 (×4): 1 via RESPIRATORY_TRACT
  Filled 2021-06-12: qty 7

## 2021-06-12 MED ORDER — LEVOTHYROXINE SODIUM 75 MCG PO TABS
75.0000 ug | ORAL_TABLET | Freq: Every day | ORAL | Status: DC
  Administered 2021-06-13 – 2021-06-16 (×4): 75 ug via ORAL
  Filled 2021-06-12 (×4): qty 1

## 2021-06-12 MED ORDER — METOPROLOL SUCCINATE ER 25 MG PO TB24
12.5000 mg | ORAL_TABLET | Freq: Every day | ORAL | Status: DC
  Administered 2021-06-14 – 2021-06-16 (×3): 12.5 mg via ORAL
  Filled 2021-06-12 (×2): qty 1
  Filled 2021-06-12: qty 0.5
  Filled 2021-06-12: qty 1

## 2021-06-12 MED ORDER — AZELASTINE HCL 0.1 % NA SOLN
2.0000 | Freq: Every day | NASAL | Status: DC
  Administered 2021-06-14 – 2021-06-16 (×3): 2 via NASAL
  Filled 2021-06-12: qty 30

## 2021-06-12 MED ORDER — ALPRAZOLAM 0.5 MG PO TABS
0.5000 mg | ORAL_TABLET | Freq: Two times a day (BID) | ORAL | Status: DC | PRN
  Administered 2021-06-13: 0.5 mg via ORAL
  Administered 2021-06-14 – 2021-06-16 (×3): 1 mg via ORAL
  Filled 2021-06-12 (×4): qty 2

## 2021-06-12 NOTE — ED Provider Notes (Signed)
[facility] DEPT Provider Note   CSN: [id number] Arrival date & time: 06/12/21  1618     History  Chief Complaint  Patient presents with   Cough   low oxygen    [name] is a 86 y.o. female.  This is a 86 y.o. female with significant medical history as below, including COPD, asthma, CAD, hypothyroidism, A-fib who presents to the ED with complaint of dyspnea, fatigue.  Patient reports fevers, chills, rigors yesterday evening, Tmax 102.  Exertional dyspnea.  Fatigue, nonproductive cough.  Clear rhinorrhea.  Generalized malaise, arthralgias.  No home oxygen use.  Was seen by PCP earlier today noted to be hypoxic, given nebulized breathing treatment and sent to the ER for evaluation.  Past Medical History: No date: Allergy No date: Anxiety No date: Arthritis     Comment:  no cartlidge in knees No date: Asthma No date: Bradycardia     Comment:  a. atenolol stopped due to this, HR 40s. 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  CAD s/p RCA stent 2000 with residual mild-mod               disease of left system 2001. No date: Chronic atrial fibrillation (HCC) No date: COPD (chronic obstructive pulmonary disease) ([place]) 03/24/2015: Essential hypertension No date: Gout     Comment:  post operative 04/02/11 -05/20/11: Hx of radiation therapy     Comment:  left breast No date: Hyperlipidemia 03/24/2015: Hypothyroidism No date: Incontinence of urine 12/26/2010: Malignant neoplasm of upper-outer quadrant of female  breast ([place]) 03/24/2015: Osteoporosis 03/24/2015: Peripheral neuropathy No date: Plantar fasciitis No date: Sleep apnea  Past Surgical History: 01/29/2011: BREAST LUMPECTOMY W/ NEEDLE LOCALIZATION     Comment:  Left with [facility] Dr Margot Chimes 1955: CHOLECYSTECTOMY 2000: CORONARY ANGIOPLASTY WITH STENT PLACEMENT 1976: [place]:  and  1996 1992: HAND SURGERY     Comment:  tendons between thumb and forefinger No date: SKIN BIOPSY     Comment:  1994, 2006, 2008, 2010, 2011, 2011, 2012-  pre-cancerous 1955: TONSILLECTOMY    The history is provided by the patient and a relative. No language interpreter was used.  Cough Associated symptoms: chills, fever and shortness of breath   Associated symptoms: no chest pain, no eye discharge, no headaches and no rash       Home Medications Prior to Admission medications   Medication Sig Start Date End Date Taking? Authorizing Provider  albuterol (PROVENTIL) (2.5 MG/3ML) 0.083% nebulizer solution Take 3 mLs (2.5 mg total) by nebulization every 6 (six) hours as needed for wheezing or shortness of breath. 09/17/16  Yes Biagio Borg, MD  albuterol (VENTOLIN HFA) 108 (90 Base) MCG/ACT inhaler INHALE 2 PUFFS BY MOUTH INTO THE LUNGS IF NEEDED FOR WHEEZING AND OR SHORTNESS OF BREATH 05/29/21  Yes Biagio Borg, MD  ALLERGY RELIEF 180 MG tablet TAKE 1 TABLET BY MOUTH EVERY DAY 07/14/20  Yes Biagio Borg, MD  allopurinol (ZYLOPRIM) 300 MG tablet Take 300 mg by mouth daily. 08/13/17  Yes [provider]  ALPRAZolam Duanne Moron) 0.5 MG tablet TAKE 1 TO 2 TABLETS BY MOUTH TWICE DAILY AS NEEDED FOR ANXIETY OR SLEEP 04/25/21  Yes Biagio Borg, MD  azelastine (ASTELIN) 0.1 % nasal spray Place 2 sprays into both nostrils daily. Use in each nostril  as directed 07/14/19  Yes Biagio Borg, MD  B Complex Vitamins (VITAMIN B COMPLEX PO) Take 1 tablet by mouth daily.   Yes [provider]  Biotin 5000 MCG CAPS Take 5,000 mcg by mouth daily.    Yes [provider]  cholecalciferol (VITAMIN D) 1000 UNITS tablet Take 1,000 Units by mouth daily.   Yes [provider]  co-enzyme Q-10 50 MG capsule Take 400 mg by mouth daily.   Yes [provider]  colchicine 0.6 MG tablet Take 0.6 mg by mouth daily as needed (flare  up).    Yes [provider]  Cyanocobalamin  (VITAMIN B-12 PO) Take 1 tablet by mouth daily.   Yes [provider]  cycloSPORINE (RESTASIS) 0.05 % ophthalmic emulsion Apply 1 drop to eye 2 (two) times daily as needed (allergies).   Yes [provider]  EPINEPHrine 0.3 mg/0.3 mL IJ SOAJ injection Inject 0.3 mg into the muscle as needed for anaphylaxis. 12/29/19  Yes [provider]  escitalopram (LEXAPRO) 10 MG tablet TAKE 1 TABLET(10 MG) BY MOUTH DAILY Patient taking differently: Take 10 mg by mouth daily as needed (anxiety). 02/05/21  Yes Biagio Borg, MD  gabapentin (NEURONTIN) 300 MG capsule Take 1 capsule (300 mg total) by mouth 3 (three) times daily. Patient taking differently: Take 300 mg by mouth at bedtime. 12/17/18  Yes Biagio Borg, MD  Homeopathic Products (ARNICARE) GEL Apply 1 application topically daily as needed (soreness).   Yes [provider]  hydrochlorothiazide (HYDRODIURIL) 25 MG tablet TAKE 2 TABLETS(50 MG) BY MOUTH DAILY 01/17/21  Yes Biagio Borg, MD  levalbuterol [facility] HFA) 45 MCG/ACT inhaler Inhale 2 puffs into the lungs every 6 (six) hours as needed for wheezing or shortness of breath. 09/30/19  Yes Biagio Borg, MD  levocetirizine (XYZAL) 5 MG tablet Take 1 tablet (5 mg total) by mouth every evening. 06/24/19  Yes Biagio Borg, MD  levothyroxine (SYNTHROID) 75 MCG tablet TAKE 1 TABLET(75 MCG) BY MOUTH DAILY 11/20/20  Yes Biagio Borg, MD  metoprolol succinate (TOPROL-XL) 25 MG 24 hr tablet TAKE 1/2 TABLET(12.5 MG) BY MOUTH DAILY 09/27/20  Yes Jettie Booze, MD  montelukast (SINGULAIR) 10 MG tablet Take 1 tablet (10 mg total) by mouth at bedtime. Patient taking differently: Take 10 mg by mouth at bedtime as needed (allergies). 07/19/19  Yes Biagio Borg, MD  nitrofurantoin (MACRODANTIN) 50 MG capsule Take 50 mg by mouth 3 (three) times a week. Take three days a week (MWF)   Yes [provider]  ondansetron (ZOFRAN) 4 MG tablet Take 1 tablet (4 mg total) by mouth  every 8 (eight) hours as needed for nausea or vomiting. 08/30/20  Yes Biagio Borg, MD  potassium chloride SA (KLOR-CON M) 20 MEQ tablet TAKE 1 TABLET(20 MEQ) BY MOUTH DAILY 04/24/21  Yes Biagio Borg, MD  Pyridoxine HCl (VITAMIN B-6 PO) Take 1 tablet by mouth daily.   Yes [provider]  [place] 420 MG/3.5ML SOCT INJECT 1 UNIT INTO THE SKIN EVERY 30 DAYS 06/11/21  Yes Jettie Booze, MD  rosuvastatin (CRESTOR) 20 MG tablet Take 20 mg by mouth 2 (two) times a week. Take on Mon & Fri 06/05/20  Yes [provider]  vitamin C (ASCORBIC ACID) 500 MG tablet Take 500 mg by mouth daily.   Yes [provider]  VITAMIN E PO Take 1 tablet by mouth daily.   Yes [provider]  WIXELA INHUB 500-50 MCG/DOSE AEPB Inhale 1 puff into the lungs 2 (two) times daily. 11/16/19  Yes Biagio Borg, MD  XARELTO 20 MG TABS tablet TAKE 1 TABLET(20 MG) BY MOUTH DAILY WITH SUPPER Patient taking differently: 20 mg every evening. 05/14/21  Yes Jettie Booze, MD  mupirocin ointment Drue Stager) 2 % Use twice a day as directed Patient not taking: Reported on 06/12/2021 02/27/18   Marrian Salvage, FNP      Allergies    Crestor Jaci Standard calcium], Hydrocodone-acetaminophen, Lidocaine hcl, Lipitor [atorvastatin calcium], Procaine, Rosuvastatin, Theophylline, Vicodin [hydrocodone-acetaminophen], Codeine, Oxycodone-acetaminophen, Propoxyphene, Quinine derivatives, Sulfa antibiotics, Atorvastatin, Ephedrine, Other, Oxycodone, Quinine, Cephalexin, Epinephrine, Penicillins, and Theophyllines    Review of Systems   Review of Systems  Constitutional:  Positive for chills, fatigue and fever. Negative for activity change.  HENT:  Negative for facial swelling and trouble swallowing.   Eyes:  Negative for discharge and redness.  Respiratory:  Positive for cough and shortness of breath.   Cardiovascular:  Negative for chest pain and palpitations.  Gastrointestinal:   Negative for abdominal pain and nausea.  Genitourinary:  Negative for dysuria and flank pain.  Musculoskeletal:  Positive for arthralgias. Negative for back pain and gait problem.  Skin:  Negative for pallor and rash.  Neurological:  Negative for syncope and headaches.   Physical Exam Updated Vital Signs BP (!) 175/135    Pulse (!) 101    Temp 98.5 F (36.9 C) (Oral)    Resp 20    SpO2 92%  Physical Exam Vitals and nursing note reviewed.  Constitutional:      General: She is in acute distress.     Appearance: Normal appearance. She is ill-appearing.  HENT:     Head: Normocephalic and atraumatic.     Right Ear: External ear normal.     Left Ear: External ear normal.     Nose: Nose normal.     Mouth/Throat:     Mouth: Mucous membranes are moist.  Eyes:     General: No scleral icterus.       Right eye: No discharge.        Left eye: No discharge.  Cardiovascular:     Rate and Rhythm: Rhythm irregular.     Pulses: Normal pulses.     Heart sounds: Normal heart sounds.  Pulmonary:     Effort: Tachypnea and respiratory distress present.     Breath sounds: Decreased air movement present. Decreased breath sounds and wheezing present.     Comments: Faint wheezing diffusely Abdominal:     General: Abdomen is flat. There is no distension.     Palpations: Abdomen is soft.     Tenderness: There is no abdominal tenderness.  Musculoskeletal:        General: Normal range of motion.     Cervical back: Normal range of motion.     Right lower leg: No edema.     Left lower leg: No edema.  Skin:    General: Skin is warm and dry.     Capillary Refill: Capillary refill takes less than 2 seconds.  Neurological:     Mental Status: She is alert.  Psychiatric:        Mood and Affect: Mood normal.        Behavior: Behavior normal.    ED Results / Procedures / Treatments   Labs (all labs ordered are listed, but only abnormal results are displayed) Labs Reviewed  CBC WITH  DIFFERENTIAL/PLATELET -  Abnormal; Notable for the following components:      Result Value   WBC 11.2 (*)    RBC 3.69 (*)    Hemoglobin 11.9 (*)    Neutro Abs 8.7 (*)    Monocytes Absolute 1.1 (*)    All other components within normal limits  COMPREHENSIVE METABOLIC PANEL - Abnormal; Notable for the following components:   Sodium 134 (*)    Potassium 3.3 (*)    Glucose, Bld 104 (*)    Total Bilirubin 2.6 (*)    All other components within normal limits  BRAIN NATRIURETIC PEPTIDE - Abnormal; Notable for the following components:   B Natriuretic Peptide 143.5 (*)    All other components within normal limits  BLOOD GAS, VENOUS - Abnormal; Notable for the following components:   Acid-Base Excess 2.7 (*)    All other components within normal limits  D-DIMER, QUANTITATIVE - Abnormal; Notable for the following components:   D-Dimer, Quant 0.67 (*)    All other components within normal limits  HEPARIN LEVEL (UNFRACTIONATED) - Abnormal; Notable for the following components:   Heparin Unfractionated >1.10 (*)    All other components within normal limits  TROPONIN I (HIGH SENSITIVITY) - Abnormal; Notable for the following components:   Troponin I (High Sensitivity) 133 (*)    All other components within normal limits  TROPONIN I (HIGH SENSITIVITY) - Abnormal; Notable for the following components:   Troponin I (High Sensitivity) 103 (*)    All other components within normal limits  RESP PANEL BY RT-PCR (FLU A&B, COVID) ARPGX2  LIPASE, BLOOD  APTT  URINALYSIS, ROUTINE W REFLEX MICROSCOPIC  HIV ANTIBODY (ROUTINE TESTING W REFLEX)  CBC  BASIC METABOLIC PANEL  APTT  HEPARIN LEVEL (UNFRACTIONATED)    EKG EKG Interpretation  Date/Time:  Tuesday June 12 2021 17:03:56 EST Ventricular Rate:  87 PR Interval:  194 QRS Duration: 109 QT Interval:  346 QTC Calculation: 417 R Axis:   81 Text Interpretation: Borderline right axis deviation Low voltage, precordial leads Atrial fibrillation  Premature ventricular complexes no stemi similar to prior Confirmed by Wynona Dove ([id number]) on 06/12/2021 7:32:44 PM  Radiology DG Chest 2 View  Result Date: 06/12/2021 CLINICAL DATA:  Cough, shortness of breath. EXAM: CHEST - 2 VIEW COMPARISON:  May 17, 2021. FINDINGS: Stable cardiomegaly is noted. Mild bibasilar subsegmental atelectasis is noted. Bony thorax is unremarkable. IMPRESSION: Mild bibasilar subsegmental atelectasis. Electronically Signed   By: Marijo Conception M.D.   On: 06/12/2021 13:39    Procedures .Critical Care Performed by: Jeanell Sparrow, DO Authorized by: Jeanell Sparrow, DO   Critical care provider statement:    Critical care time (minutes):  48   Critical care time was exclusive of:  Separately billable procedures and treating other patients   Critical care was necessary to treat or prevent imminent or life-threatening deterioration of the following conditions:  Cardiac failure and respiratory failure   Critical care was time spent personally by me on the following activities:  Development of treatment plan with patient or surrogate, discussions with consultants, evaluation of patient's response to treatment, examination of patient, ordering and review of laboratory studies, ordering and review of radiographic studies, ordering and performing treatments and interventions, pulse oximetry, re-evaluation of patient's condition and review of old charts   Care discussed with: admitting provider      Medications Ordered in ED Medications  levofloxacin (LEVAQUIN) IVPB 750 mg (has no administration in time range)  predniSONE (DELTASONE) tablet 40  mg (has no administration in time range)  mometasone-formoterol (DULERA) 200-5 MCG/ACT inhaler 2 puff (2 puffs Inhalation Not Given 06/12/21 2300)  albuterol (PROVENTIL) (2.5 MG/3ML) 0.083% nebulizer solution 2.5 mg (has no administration in time range)  umeclidinium bromide (INCRUSE ELLIPTA) 62.5 MCG/ACT 1 puff (1 puff Inhalation Not  Given 06/12/21 2300)  heparin ADULT infusion 100 units/mL (25000 units/298m) (1,200 Units/hr Intravenous New Bag/Given 06/13/21 0001)  allopurinol (ZYLOPRIM) tablet 300 mg (has no administration in time range)  ALPRAZolam (XANAX) tablet 0.5-1 mg (has no administration in time range)  azelastine (ASTELIN) 0.1 % nasal spray 2 spray (has no administration in time range)  cycloSPORINE (RESTASIS) 0.05 % ophthalmic emulsion 1 drop (has no administration in time range)  gabapentin (NEURONTIN) capsule 300 mg (300 mg Oral Given 06/12/21 2350)  levothyroxine (SYNTHROID) tablet 75 mcg (has no administration in time range)  metoprolol succinate (TOPROL-XL) 24 hr tablet 12.5 mg (has no administration in time range)  rosuvastatin (CRESTOR) tablet 20 mg (has no administration in time range)  loratadine (CLARITIN) tablet 10 mg (10 mg Oral Given 06/12/21 2350)  levofloxacin (LEVAQUIN) IVPB 750 mg (0 mg Intravenous Stopped 06/12/21 2148)  methylPREDNISolone sodium succinate (SOLU-MEDROL) 125 mg/2 mL injection 60 mg (60 mg Intravenous Given 06/12/21 2353)  potassium chloride SA (KLOR-CON M) CR tablet 20 mEq (20 mEq Oral Given 06/12/21 2350)  aspirin tablet 325 mg (325 mg Oral Given 06/12/21 2350)    ED Course/ Medical Decision Making/ A&P                           Medical Decision Making Amount and/or Complexity of Data Reviewed Labs: ordered.  Risk Prescription drug management. Decision regarding hospitalization.    CC: Dyspnea  This patient presents to the Emergency Department for the above complaint. This involves an extensive number of treatment options and is a complaint that carries with it a high risk of complications and morbidity. Vital signs were reviewed. Serious etiologies considered.  Record review:  Previous records obtained and reviewed   Medical and surgical history as noted above.   Chest x-ray from earlier today was reviewed, no discrete pneumonia or significant pleural effusion  Work up as  above, notable for:  Labs & imaging results that were available during my care of the patient were reviewed by me and considered in my medical decision making. Cardiac monitoring reviewed and interpreted personally which shows afib  Social determinants of health include - N/a  EKG stable, troponin elevated, dyspnea; no STEMI on EKG. Will start heparin given she is currently symptomatic.   Pt with likely COPD exacerbation vs pneumonia, will start levaquin given extensive drug allergies. Does not appear to be septic.  Given dyspnea, low risk well's score, d-dimer was obtained. Age adjusted dimer is not positive; lower suspicion for PE.   Viral swabs negative  Management: Supplemental oxygen, antibiotics, nebulized breathing treatments, steroids. Heparin.   Reassessment:  Concern for community-acquired pneumonia versus COPD exacerbation. NSTEMI, Hypoxia. Pt requiring admission. Pt/family agreeable.   D/w Dr GAlcario Droughtwho accepts pt for admission.      This chart was dictated using voice recognition software.  Despite best efforts to proofread,  errors can occur which can change the documentation meaning.         Final Clinical Impression(s) / ED Diagnoses Final diagnoses:  Acute respiratory failure with hypoxia (HCC)  COPD exacerbation (HCC)  Upper respiratory tract infection, unspecified type  NSTEMI (non-ST elevated myocardial infarction) (  [facility])    Rx / DC Orders ED Discharge Orders     None         Jeanell Sparrow, DO 06/13/21 0114

## 2021-06-12 NOTE — Assessment & Plan Note (Addendum)
HCTZ, metop

## 2021-06-12 NOTE — Assessment & Plan Note (Signed)
Cont crestor 2x weekly

## 2021-06-12 NOTE — Telephone Encounter (Signed)
Connected to [facility] 2.6.2023.  Pt temp is 100.2 PO and is vomiting X 1, nauseated. onset today. denies urination pain, urination symptoms. Recently had UTI and finished abx about a week ago.   Advised to see HCP within 4 hours.

## 2021-06-12 NOTE — ED Provider Notes (Signed)
[facility]   [id number] 06/12/21 Arrival Time: 1214  ASSESSMENT & PLAN:  1. SOB (shortness of breath)   2. COPD exacerbation (HCC)   3. Hypoxia    Spo2 on RA between 84-89% after DuoNeb. SpO2 94% 3 weeks ago at outpt visit.  I have personally viewed the imaging studies ordered this visit. Mild atelectasis. No appreciable infiltrates.  ECG: Performed today and interpreted by me: normal EKG, normal sinus rhythm, atrial fibrillation, rate controlled with PVCs. No STEMI. From past ECGs it appears she is always in A. Fib.  To ED via private vehicle. Stable upon discharge.  Results for orders placed or performed during the hospital encounter of 06/12/21  POC Urinalysis dipstick  Result Value Ref Range   Glucose, UA NEGATIVE NEGATIVE mg/dL   Bilirubin Urine SMALL (A) NEGATIVE   Ketones, ur TRACE (A) NEGATIVE mg/dL   Specific Gravity, Urine 1.020 1.005 - 1.030   Hgb urine dipstick TRACE (A) NEGATIVE   pH 5.5 5.0 - 8.0   Protein, ur NEGATIVE NEGATIVE mg/dL   Urobilinogen, UA 0.2 0.0 - 1.0 mg/dL   Nitrite NEGATIVE NEGATIVE   Leukocytes,Ua NEGATIVE NEGATIVE  POC CBG monitoring  Result Value Ref Range   Glucose-Capillary 97 70 - 99 mg/dL    Meds ordered this encounter  Medications   ipratropium-albuterol (DUONEB) 0.5-2.5 (3) MG/3ML nebulizer solution 3 mL    Chest pain precautions given. Reviewed expectations re: course of current medical issues. Questions answered. Outlined signs and symptoms indicating need for more acute intervention. Patient verbalized understanding. After Visit Summary given.   SUBJECTIVE:  History from: patient and daughter. [name] is a 86 y.o. female with multiple medical problems including A.Fib and COPD and CHF who presents with complaint of feeling SOB; past sev days; gradual onset. With productive cough. Reports Tmax 102F last evening with associated chills. Overall fatigued. Decreased appetite and PO intake. Nausea today with one  emesis. No diarrhea. No assoc CP. Denies abd or back pain. Treated for UTI > 1 wk ago. No current symptoms. Does not use O2 at home. Does use CPAP machine.  Social History   Tobacco Use  Smoking Status Former   Packs/day: 1.00   Years: 4.00   Pack years: 4.00   Types: Cigarettes   Quit date: 12/05/1962   Years since quitting: 58.5  Smokeless Tobacco Never   Social History   Substance and Sexual Activity  Alcohol Use No     OBJECTIVE:  Vitals:   06/12/21 1308  BP: 139/74  Pulse: 77  Resp: 18  Temp: 99.2 F (37.3 C)  TempSrc: Oral  SpO2: (!) 86%    General appearance: alert, oriented, no acute distress; very SOB at times Eyes: PERRLA; EOMI; conjunctivae normal HENT: normocephalic; atraumatic Neck: supple with FROM Lungs: with labored respirations; speaks short sentences; CTAB CV: irreg irreg Abdomen: obese Extremities: without significant edema Psychological: alert and cooperative; normal mood and affect  Labs: Results for orders placed or performed during the hospital encounter of 06/12/21  POC Urinalysis dipstick  Result Value Ref Range   Glucose, UA NEGATIVE NEGATIVE mg/dL   Bilirubin Urine SMALL (A) NEGATIVE   Ketones, ur TRACE (A) NEGATIVE mg/dL   Specific Gravity, Urine 1.020 1.005 - 1.030   Hgb urine dipstick TRACE (A) NEGATIVE   pH 5.5 5.0 - 8.0   Protein, ur NEGATIVE NEGATIVE mg/dL   Urobilinogen, UA 0.2 0.0 - 1.0 mg/dL   Nitrite NEGATIVE NEGATIVE   Leukocytes,Ua NEGATIVE NEGATIVE  POC CBG monitoring  Result Value Ref Range   Glucose-Capillary 97 70 - 99 mg/dL   Labs Reviewed  POCT URINALYSIS DIPSTICK, ED / UC - Abnormal; Notable for the following components:      Result Value   Bilirubin Urine SMALL (*)    Ketones, ur TRACE (*)    Hgb urine dipstick TRACE (*)    All other components within normal limits  CBG MONITORING, ED    Imaging: DG Chest 2 View  Result Date: 06/12/2021 CLINICAL DATA:  Cough, shortness of breath. EXAM: CHEST - 2 VIEW  COMPARISON:  May 17, 2021. FINDINGS: Stable cardiomegaly is noted. Mild bibasilar subsegmental atelectasis is noted. Bony thorax is unremarkable. IMPRESSION: Mild bibasilar subsegmental atelectasis. Electronically Signed   By: Marijo Conception M.D.   On: 06/12/2021 13:39     Allergies  Allergen Reactions   Crestor [Rosuvastatin Calcium]     Severe liver function problems   Hydrocodone-Acetaminophen Anxiety and Itching   Lidocaine Hcl Other (See Comments)    Novacaine:  Becomes very shaky   Lipitor [Atorvastatin Calcium] Other (See Comments)    Elevated liver function    Procaine Other (See Comments)    shaking   Rosuvastatin Other (See Comments)    Severe liver function problems   Theophylline Other (See Comments)    Becomes very shaky skaking skaking   Vicodin [Hydrocodone-Acetaminophen] Itching    Itching all over   Codeine Nausea Only, Anxiety and Other (See Comments)    Also complained of dizziness.  Has same problems with oxycodone and hydrocodone Visual Disturbance, Balance Difficulty, Dizzy "Room Spinning; "Swimming" Balance, vision, nausea, dizzy, "swimming", "room spinning" Balance, vision, nausea, dizzy, "swimming", "room spinning"   Oxycodone-Acetaminophen Nausea Only, Anxiety and Other (See Comments)    Visual Disturbance, Balance Difficulty, Dizzy "Room Spinning; "Swimming"   Propoxyphene Anxiety, Nausea Only and Other (See Comments)    Visual Disturbance, Balance Difficulty, Dizzy "Room Spinning; "Swimming" Balance, vision, nausea, dizzy, "swimming", "room spinning" Balance, vision, nausea, dizzy, "swimming, "room spinning"   Quinine Derivatives Nausea Only    dizzy   Sulfa Antibiotics Nausea Only    Also complained of dizziness   Atorvastatin Other (See Comments)    Severe liver function problems   Ephedrine Other (See Comments)    shaking   Other Other (See Comments)    Quinine causes nausea, dizzy   Oxycodone Other (See Comments)    Balance, vision,  nausea, dizzy, "swimming", "room spinning"   Quinine Other (See Comments)   Cephalexin Rash and Other (See Comments)   Epinephrine Other (See Comments)    Patient becomes "shaky" shaking shaking   Penicillins Rash    Pt reported all over body rash in 1958 Has patient had a PCN reaction causing immediate rash, facial/tongue/throat swelling, SOB or lightheadedness with hypotension:Yes Has patient had a PCN reaction causing severe rash involving mucus membranes or skin necrosis: No Has patient had a PCN reaction that required hospitalization: No Has patient had a PCN reaction occurring within the last 10 years:No     Theophyllines Other (See Comments)    Patient becomes "shaky"    Past Medical History:  Diagnosis Date   Allergy    Anxiety    Arthritis    no cartlidge in knees   Asthma    Bradycardia    a. atenolol stopped due to this, HR 40s.   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  CAD s/p RCA stent 2000 with residual mild-mod disease of left system 2001.   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   Osteoporosis 03/24/2015   Peripheral neuropathy 03/24/2015   Plantar fasciitis    Sleep apnea    Social History   Socioeconomic History   Marital status: Widowed    Spouse name: Not on file   Number of children: Not on file   Years of education: Not on file   Highest education level: Not on file  Occupational History   Not on file  Tobacco Use   Smoking status: Former    Packs/day: 1.00    Years: 4.00    Pack years: 4.00    Types: Cigarettes    Quit date: 12/05/1962    Years since quitting: 58.5    Smokeless tobacco: Never  Vaping Use   Vaping Use: Never used  Substance and Sexual Activity   Alcohol use: No   Drug use: No   Sexual activity: Not on file  Other Topics Concern   Not on file  Social History Narrative   Right handed   Social Determinants of Health   Financial Resource Strain: Not on file  Food Insecurity: Not on file  Transportation Needs: Not on file  Physical Activity: Not on file  Stress: Not on file  Social Connections: Not on file  Intimate Partner Violence: Not on file   Family History  Problem Relation Age of Onset   Heart disease Father    Cancer Father        intestinal polyps   Heart attack Father    Asthma Sister    Heart disease Brother    Heart attack Brother    Past Surgical History:  Procedure Laterality Date   BREAST LUMPECTOMY W/ NEEDLE LOCALIZATION  01/29/2011   Left with [facility] Dr Margot Chimes   CHOLECYSTECTOMY  1955   CORONARY ANGIOPLASTY WITH STENT PLACEMENT  2000   DILATION AND CURETTAGE OF UTERUS  1976   and 1996   [place]   tendons between thumb and forefinger   SKIN BIOPSY     1994, 2006, 2008, 2010, 2011, 2011, 2012-  pre-cancerous   TONSILLECTOMY  1955      Vanessa Kick, MD 06/12/21 1558

## 2021-06-12 NOTE — H&P (Signed)
History and Physical    Patient: [name] QIH:[id number] DOB: 10-28-1935 DOA: 06/12/2021 DOS: the patient was seen and examined on 06/12/2021 PCP: Biagio Borg, MD  Patient coming from: Home  Chief Complaint:  Chief Complaint  Patient presents with   Cough   low oxygen    HPI: [name] is a 86 y.o. female with medical history significant of Asthma, COPD, HTN, a.fib on Xarelto.  Pt presents to ED with c/o cough productive of yellow sputum, SOB, wheezing.  Had fevers, chills, rigors yesterday with Tm 102.  Symptoms persistent, constant.  Went to UC: hypoxic in UC, sent to ER after breathing treatment.  No CP.  In ED: CXR neg Trop 133 EKG shows A.Fib (chronic). Given neb treatments with improvement of breathing However still has persistent new O2 requirement.  Review of Systems: As mentioned in the history of present illness. All other systems reviewed and are negative. Past Medical History:  Diagnosis Date   Allergy    Anxiety    Arthritis    no cartlidge in knees   Asthma    Bradycardia    a. atenolol stopped due to this, HR 40s.   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  CAD s/p RCA stent 2000 with residual mild-mod disease of left system 2001.   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 Her smoking use included cigarettes. She has a 4.00 pack-year smoking history. She has never used smokeless tobacco. She reports that she does not drink alcohol and does not use drugs.  Allergies  Allergen Reactions   Crestor [Rosuvastatin Calcium]     Severe liver function problems   Hydrocodone-Acetaminophen Anxiety and Itching   Lidocaine Hcl Other (See Comments)    Novacaine:  Becomes very shaky   Lipitor [Atorvastatin Calcium] Other (See Comments)    Elevated liver function    Procaine Other (See Comments)    shaking   Rosuvastatin Other (See Comments)    Severe liver function problems   Theophylline Other (See Comments)    Becomes very shaky skaking skaking   Vicodin [Hydrocodone-Acetaminophen] Itching    Itching all over   Codeine Nausea Only, Anxiety and Other (See Comments)    Also complained of dizziness.  Has same problems with oxycodone and hydrocodone Visual Disturbance, Balance Difficulty, Dizzy "Room Spinning; "Swimming" Balance, vision, nausea, dizzy, "swimming", "room  spinning" Balance, vision, nausea, dizzy, "swimming", "room spinning"   Oxycodone-Acetaminophen Nausea Only, Anxiety and Other (See Comments)    Visual Disturbance, Balance Difficulty, Dizzy "Room Spinning; "Swimming"   Propoxyphene Anxiety, Nausea Only and Other (See Comments)    Visual Disturbance, Balance Difficulty, Dizzy "Room Spinning; "Swimming" Balance, vision, nausea, dizzy, "swimming", "room spinning" Balance, vision, nausea, dizzy, "swimming, "room spinning"   Quinine Derivatives Nausea Only    dizzy   Sulfa Antibiotics Nausea Only    Also complained of dizziness   Atorvastatin Other (See Comments)    Severe  liver function problems   Ephedrine Other (See Comments)    shaking   Other Other (See Comments)    Quinine causes nausea, dizzy   Oxycodone Other (See Comments)    Balance, vision, nausea, dizzy, "swimming", "room spinning"   Quinine Other (See Comments)   Cephalexin Rash and Other (See Comments)   Epinephrine Other (See Comments)    Patient becomes "shaky" shaking shaking   Penicillins Rash    Pt reported all over body rash in 1958 Has patient had a PCN reaction causing immediate rash, facial/tongue/throat swelling, SOB or lightheadedness with hypotension:Yes Has patient had a PCN reaction causing severe rash involving mucus membranes or skin necrosis: No Has patient had a PCN reaction that required hospitalization: No Has patient had a PCN reaction occurring within the last 10 years:No     Theophyllines Other (See Comments)    Patient becomes "shaky"    Family History  Problem Relation Age of Onset   Heart disease Father    Cancer Father        intestinal polyps   Heart attack Father    Asthma Sister    Heart disease Brother    Heart attack Brother     Prior to Admission medications   Medication Sig Start Date End Date Taking? Authorizing Provider  albuterol (PROVENTIL) (2.5 MG/3ML) 0.083% nebulizer solution Take 3 mLs (2.5 mg total) by nebulization every 6 (six) hours as needed for wheezing or shortness of breath. 09/17/16  Yes Biagio Borg, MD  albuterol (VENTOLIN HFA) 108 (90 Base) MCG/ACT inhaler INHALE 2 PUFFS BY MOUTH INTO THE LUNGS IF NEEDED FOR WHEEZING AND OR SHORTNESS OF BREATH 05/29/21  Yes Biagio Borg, MD  ALLERGY RELIEF 180 MG tablet TAKE 1 TABLET BY MOUTH EVERY DAY 07/14/20  Yes Biagio Borg, MD  allopurinol (ZYLOPRIM) 300 MG tablet Take 300 mg by mouth daily. 08/13/17  Yes [provider]  ALPRAZolam Duanne Moron) 0.5 MG tablet TAKE 1 TO 2 TABLETS BY MOUTH TWICE DAILY AS NEEDED FOR ANXIETY OR SLEEP 04/25/21  Yes Biagio Borg, MD  azelastine (ASTELIN)  0.1 % nasal spray Place 2 sprays into both nostrils daily. Use in each nostril as directed 07/14/19  Yes Biagio Borg, MD  B Complex Vitamins (VITAMIN B COMPLEX PO) Take 1 tablet by mouth daily.   Yes [provider]  Biotin 5000 MCG CAPS Take 5,000 mcg by mouth daily.    Yes [provider]  cholecalciferol (VITAMIN D) 1000 UNITS tablet Take 1,000 Units by mouth daily.   Yes [provider]  co-enzyme Q-10 50 MG capsule Take 400 mg by mouth daily.   Yes [provider]  colchicine 0.6 MG tablet Take 0.6 mg by mouth daily as needed (flare  up).    Yes [provider]  Cyanocobalamin (VITAMIN B-12 PO) Take 1 tablet by mouth daily.   Yes [provider]  cycloSPORINE (RESTASIS) 0.05 % ophthalmic emulsion Apply 1 drop to eye 2 (two) times daily as needed (allergies).   Yes [provider]  EPINEPHrine 0.3 mg/0.3 mL IJ SOAJ injection Inject 0.3 mg into the muscle as needed for anaphylaxis. 12/29/19  Yes [provider]  escitalopram (LEXAPRO) 10 MG tablet TAKE 1 TABLET(10 MG) BY MOUTH DAILY Patient taking differently: Take 10 mg by mouth daily as needed (anxiety). 02/05/21  Yes Biagio Borg, MD  gabapentin (NEURONTIN) 300 MG capsule Take 1 capsule (300 mg total) by mouth 3 (three) times daily. Patient taking differently: Take 300 mg by mouth at bedtime. 12/17/18  Yes Biagio Borg, MD  Homeopathic Products (ARNICARE) GEL Apply 1 application topically daily as needed (soreness).   Yes [provider]  hydrochlorothiazide (HYDRODIURIL) 25 MG tablet TAKE 2 TABLETS(50 MG) BY MOUTH DAILY 01/17/21  Yes Biagio Borg, MD  levalbuterol [facility] HFA) 45 MCG/ACT inhaler Inhale 2 puffs into the lungs every 6 (six) hours as needed for wheezing or shortness of breath. 09/30/19  Yes Biagio Borg, MD  levocetirizine (XYZAL) 5 MG tablet Take 1 tablet (5 mg total) by mouth every evening. 06/24/19  Yes Biagio Borg, MD  levothyroxine  (SYNTHROID) 75 MCG tablet TAKE 1 TABLET(75 MCG) BY MOUTH DAILY 11/20/20  Yes Biagio Borg, MD  metoprolol succinate (TOPROL-XL) 25 MG 24 hr tablet TAKE 1/2 TABLET(12.5 MG) BY MOUTH DAILY 09/27/20  Yes Jettie Booze, MD  montelukast (SINGULAIR) 10 MG tablet Take 1 tablet (10 mg total) by mouth at bedtime. Patient taking differently: Take 10 mg by mouth at bedtime as needed (allergies). 07/19/19  Yes Biagio Borg, MD  nitrofurantoin (MACRODANTIN) 50 MG capsule Take 50 mg by mouth 3 (three) times a week. Take three days a week (MWF)   Yes [provider]  ondansetron (ZOFRAN) 4 MG tablet Take 1 tablet (4 mg total) by mouth every 8 (eight) hours as needed for nausea or vomiting. 08/30/20  Yes Biagio Borg, MD  potassium chloride SA (KLOR-CON M) 20 MEQ tablet TAKE 1 TABLET(20 MEQ) BY MOUTH DAILY 04/24/21  Yes Biagio Borg, MD  Pyridoxine HCl (VITAMIN B-6 PO) Take 1 tablet by mouth daily.   Yes [provider]  [place] 420 MG/3.5ML SOCT INJECT 1 UNIT INTO THE SKIN EVERY 30 DAYS 06/11/21  Yes Jettie Booze, MD  rosuvastatin (CRESTOR) 20 MG tablet Take 20 mg by mouth 2 (two) times a week. Take on Mon & Fri 06/05/20  Yes [provider]  vitamin C (ASCORBIC ACID) 500 MG tablet Take 500 mg by mouth daily.   Yes [provider]  VITAMIN E PO Take 1 tablet by mouth daily.   Yes [provider]  Grant Ruts INHUB 500-50 MCG/DOSE AEPB Inhale 1 puff into the lungs 2 (two) times daily. 11/16/19  Yes Biagio Borg, MD  XARELTO 20 MG TABS tablet TAKE 1 TABLET(20 MG) BY MOUTH DAILY WITH SUPPER Patient taking differently: 20 mg every evening. 05/14/21  Yes Jettie Booze, MD  azelastine (OPTIVAR) 0.05 % ophthalmic solution Place 1 drop into both eyes 2 (two) times daily. Patient not taking: Reported on 06/12/2021 09/17/16   Biagio Borg, MD  cephALEXin (KEFLEX) 500 MG capsule Take 1 capsule (500 mg total) by mouth 4 (four) times daily. Patient not  taking: Reported on 05/17/2021 05/11/21   Ailene Ards, NP  diclofenac sodium (VOLTAREN) 1 % GEL Apply 4 g topically  4 (four) times daily as needed. Patient not taking: Reported on 06/12/2021 09/17/18   Biagio Borg, MD  doxycycline (VIBRA-TABS) 100 MG tablet Take 1 tablet (100 mg total) by mouth 2 (two) times daily. Patient not taking: Reported on 05/17/2021 05/14/21   Biagio Borg, MD  furosemide (LASIX) 40 MG tablet Take 1 tablet (40 mg total) by mouth daily. Patient not taking: Reported on 06/12/2021 11/17/19   Biagio Borg, MD  guaiFENesin-dextromethorphan [facility] DM) 100-10 MG/5ML syrup Take 5 mLs by mouth every 6 (six) hours as needed for cough. Patient not taking: Reported on 06/12/2021 11/21/20   Biagio Borg, MD  mupirocin ointment Drue Stager) 2 % Use twice a day as directed Patient not taking: Reported on 06/12/2021 02/27/18   Marrian Salvage, FNP  triamcinolone (NASACORT AQ) 55 MCG/ACT AERO nasal inhaler Place 2 sprays daily into the nose. Patient not taking: Reported on 06/12/2021 03/20/17   Biagio Borg, MD  triamcinolone ointment (KENALOG) 0.5 % Apply 1 application topically every other day as needed. Patient not taking: Reported on 06/12/2021 12/05/20   Lyndal Pulley, DO    Physical Exam: Vitals:   06/12/21 1730 06/12/21 1848 06/12/21 1930 06/12/21 2030  BP: (!) 155/93 (!) 139/93 (!) 152/55 (!) 146/101  Pulse: 92 (!) 30 74 80  Resp: (!) 25 (!) 24 (!) 25 (!) 23  Temp:      TempSrc:      SpO2: 96% 95% 95% 97%   Constitutional: NAD, obese Eyes: PERRL, lids and conjunctivae normal ENMT: Mucous membranes are moist. Posterior pharynx clear of any exudate or lesions.Normal dentition.  Neck: normal, supple, no masses, no thyromegaly Respiratory: Wheezing present, no tachypnea nor respiratory distress at this point. Cardiovascular: IRR, IRR Abdomen: no tenderness, no masses palpated. No hepatosplenomegaly. Bowel sounds positive.  Musculoskeletal: no clubbing / cyanosis. No joint  deformity upper and lower extremities. Good ROM, no contractures. Normal muscle tone.  Skin: no rashes, lesions, ulcers. No induration Neurologic: CN 2-12 grossly intact. Sensation intact, DTR normal. Strength 5/5 in all 4.  Psychiatric: Normal judgment and insight. Alert and oriented x 3. Normal mood.    Data Reviewed:  CXR neg  Assessment and Plan: * COPD exacerbation ([place])- (present on admission) COPD pathway Systemic steroids Scheduled LABA/LAMA/INH steroid PRN SABA levaquin Cont pulse ox  Acute respiratory failure with hypoxia ([place])- (present on admission) New O2 requirement due to COPD exacerbation. Patient has acute respiratory failure with hypoxia due to having a new oxygen requirement.  That is the patient has a PaO2 < 60 (pulse Ox < 90%) on room air.  Elevated troponin- (present on admission) Likely just demand ischemia Repeat trop pending Tele monitor Holding xarelto and putting on heparin gtt for the moment Got ASA 325 in ED  Atrial fibrillation ([place])- (present on admission) Chronic Holding xarelto Cont home rate ctrl meds  Mixed hyperlipidemia- (present on admission) Cont crestor 2x weekly  Essential hypertension- (present on admission) Cont home metoprolol meds       Advance Care Planning:   Code Status: Full Code   Consults: None  Family Communication: Daughter at bedside  Severity of Illness: The appropriate patient status for this patient is OBSERVATION. Observation status is judged to be reasonable and necessary in order to provide the required intensity of service to ensure the patient's safety. The patient's presenting symptoms, physical exam findings, and initial radiographic and laboratory data in the context of their medical condition is felt to place them at  decreased risk for further clinical deterioration. Furthermore, it is anticipated that the patient will be medically stable for discharge from the hospital within 2 midnights of  admission.   Author: Etta Quill., DO 06/12/2021 10:10 PM  For on call review [email or web address].

## 2021-06-12 NOTE — Assessment & Plan Note (Addendum)
Wheezing improved, today S/p 5 days steroids Last dose of levaquin tonight Resume home inhalers (wixela, levalbuterol) Discharge with home o2, suspect this will be able to wean off outpatient - desatted to 88-89 on RA today (to 82 yesterday).

## 2021-06-12 NOTE — ED Notes (Signed)
Patient is being discharged from the Urgent Care and sent to the Emergency Department via POV . Per Dr. Mannie Stabile, patient is in need of higher level of care due to SOB. Patient is aware and verbalizes understanding of plan of care.  Vitals:   06/12/21 1308  BP: 139/74  Pulse: 77  Resp: 18  Temp: 99.2 F (37.3 C)  SpO2: (!) 86%

## 2021-06-12 NOTE — Progress Notes (Signed)
[place] for heparin Indication: chest pain/ACS (home xarelto on hold)  Allergies  Allergen Reactions   Crestor [Rosuvastatin Calcium]     Severe liver function problems   Hydrocodone-Acetaminophen Anxiety and Itching   Lidocaine Hcl Other (See Comments)    Novacaine:  Becomes very shaky   Lipitor [Atorvastatin Calcium] Other (See Comments)    Elevated liver function    Procaine Other (See Comments)    shaking   Rosuvastatin Other (See Comments)    Severe liver function problems   Theophylline Other (See Comments)    Becomes very shaky skaking skaking   Vicodin [Hydrocodone-Acetaminophen] Itching    Itching all over   Codeine Nausea Only, Anxiety and Other (See Comments)    Also complained of dizziness.  Has same problems with oxycodone and hydrocodone Visual Disturbance, Balance Difficulty, Dizzy "Room Spinning; "Swimming" Balance, vision, nausea, dizzy, "swimming", "room spinning" Balance, vision, nausea, dizzy, "swimming", "room spinning"   Oxycodone-Acetaminophen Nausea Only, Anxiety and Other (See Comments)    Visual Disturbance, Balance Difficulty, Dizzy "Room Spinning; "Swimming"   Propoxyphene Anxiety, Nausea Only and Other (See Comments)    Visual Disturbance, Balance Difficulty, Dizzy "Room Spinning; "Swimming" Balance, vision, nausea, dizzy, "swimming", "room spinning" Balance, vision, nausea, dizzy, "swimming, "room spinning"   Quinine Derivatives Nausea Only    dizzy   Sulfa Antibiotics Nausea Only    Also complained of dizziness   Atorvastatin Other (See Comments)    Severe liver function problems   Ephedrine Other (See Comments)    shaking   Other Other (See Comments)    Quinine causes nausea, dizzy   Oxycodone Other (See Comments)    Balance, vision, nausea, dizzy, "swimming", "room spinning"   Quinine Other (See Comments)   Cephalexin Rash and Other (See Comments)   Epinephrine Other (See Comments)    Patient  becomes "shaky" shaking shaking   Penicillins Rash    Pt reported all over body rash in 1958 Has patient had a PCN reaction causing immediate rash, facial/tongue/throat swelling, SOB or lightheadedness with hypotension:Yes Has patient had a PCN reaction causing severe rash involving mucus membranes or skin necrosis: No Has patient had a PCN reaction that required hospitalization: No Has patient had a PCN reaction occurring within the last 10 years:No     Theophyllines Other (See Comments)    Patient becomes "shaky"    Patient Measurements: weight = 128 lbs doc on 05/08/21; height 67 inches   Heparin Dosing Weight: 91kg  Vital Signs: Temp: 98.5 F (36.9 C) (02/07 1644) Temp Source: Oral (02/07 1644) BP: 146/101 (02/07 2030) Pulse Rate: 80 (02/07 2030)  Labs: Recent Labs    06/12/21 1826  HGB 11.9*  HCT 36.8  PLT 159  CREATININE 0.89  TROPONINIHS 133*    CrCl cannot be calculated (Unknown ideal weight.).   Medications:  - on xarelto 20 mg daily PTA (last dose taken on 2/6 at 2130)  Assessment: Patient is an 32 u.o F with hx COPD and afib on xarelto PTA, presented to Urgent Care on 06/12/21 with c/o SOB, fever and cough.  Troponin was found to be elevated in the ED. Pharmacy has been consulted to transition patient to heparin drip for ACS.  Today, 06/12/2021: - scr <1 - cbc ok - troponin 133   Goal of Therapy:  Heparin level 0.3-0.7 units/ml aPTT 66-102 seconds Monitor platelets by anticoagulation protocol: Yes   Plan:  - baseline aPPT and heparin level now - start heparin  at 1200 units/hr - check 8 hr heparin level and aPTT - monitor for s/sx bleeding  Lynelle Doctor 06/12/2021,8:45 PM

## 2021-06-12 NOTE — Telephone Encounter (Signed)
Left message for patient to call back to discuss her symptoms.

## 2021-06-12 NOTE — Assessment & Plan Note (Addendum)
Chronic xarelto Cont home rate ctrl meds

## 2021-06-12 NOTE — Assessment & Plan Note (Addendum)
2/2 pneumonia and COPD exacerbation CT today with persistent hypoxia showing multifocal pneumonia CXR with bibasilar subsegmental atelectasis Steroids complete.  Abx to finish tonight. Discharging with o2 with activity, follow outpatient to wean

## 2021-06-12 NOTE — ED Provider Triage Note (Signed)
Emergency Medicine Provider Triage Evaluation Note  [name] , a 86 y.o. female  was evaluated in triage.  Pt complains of shortness of breath, fever, emesis, and fatigue for the past few days. Patient was seen at [facility] prior to arrival and found to be hypoxic in the mid 80s, so was sent to the ED for further evaluation. CXR there demonstrated mild atelectasis, but no evidence of pneumonia. She was given a Duoneb treatment prior to arrival with no improvement in oxygen. She is not on chronic oxygen at home.  Review of Systems  Positive: SOB Negative: dysuria  Physical Exam  BP 135/72 (BP Location: Right Arm)    Pulse 88    Temp 98.5 F (36.9 C) (Oral)    Resp 20    SpO2 (!) 87%  Gen:   Awake, no distress   Resp:  Normal effort  MSK:   Moves extremities without difficulty  Other:  On [place]  Medical Decision Making  Medically screening exam initiated at 4:56 PM.  Appropriate orders placed.  [name] was informed that the remainder of the evaluation will be completed by another provider, this initial triage assessment does not replace that evaluation, and the importance of remaining in the ED until their evaluation is complete.  Labs ordered CXR already performed at [facility], no evidence of pneumonia COVID/ flu test   Suzy Bouchard, PA-C 06/12/21 1659

## 2021-06-12 NOTE — ED Triage Notes (Signed)
Pt c/o fever, productive cough, fatigue, nausea, chills, no appetite, and SOB on exertion x2 days. States was treated for a UTI 2wks ago and had same sx's then and went away.

## 2021-06-12 NOTE — Assessment & Plan Note (Addendum)
Troponin downtrending, relatively low - suspect demand EKG with atrial fibrillation, PVC's, IVCD Denies CP - suspect this is demand ischemia, not c/w ACS Follow echo -> echo with EF 60-65%, no RWMA, mild concentric LVH, moderately elevated PASP

## 2021-06-13 ENCOUNTER — Encounter (HOSPITAL_COMMUNITY): Payer: Self-pay | Admitting: Family Medicine

## 2021-06-13 ENCOUNTER — Observation Stay (HOSPITAL_COMMUNITY): Payer: Medicare Other

## 2021-06-13 DIAGNOSIS — I482 Chronic atrial fibrillation, unspecified: Secondary | ICD-10-CM | POA: Diagnosis present

## 2021-06-13 DIAGNOSIS — G4733 Obstructive sleep apnea (adult) (pediatric): Secondary | ICD-10-CM | POA: Diagnosis not present

## 2021-06-13 DIAGNOSIS — Z20822 Contact with and (suspected) exposure to covid-19: Secondary | ICD-10-CM | POA: Diagnosis present

## 2021-06-13 DIAGNOSIS — R0600 Dyspnea, unspecified: Secondary | ICD-10-CM | POA: Diagnosis present

## 2021-06-13 DIAGNOSIS — N200 Calculus of kidney: Secondary | ICD-10-CM | POA: Diagnosis not present

## 2021-06-13 DIAGNOSIS — Z882 Allergy status to sulfonamides status: Secondary | ICD-10-CM | POA: Diagnosis not present

## 2021-06-13 DIAGNOSIS — E876 Hypokalemia: Secondary | ICD-10-CM | POA: Diagnosis present

## 2021-06-13 DIAGNOSIS — E039 Hypothyroidism, unspecified: Secondary | ICD-10-CM | POA: Diagnosis present

## 2021-06-13 DIAGNOSIS — I248 Other forms of acute ischemic heart disease: Secondary | ICD-10-CM

## 2021-06-13 DIAGNOSIS — R778 Other specified abnormalities of plasma proteins: Secondary | ICD-10-CM | POA: Diagnosis not present

## 2021-06-13 DIAGNOSIS — E782 Mixed hyperlipidemia: Secondary | ICD-10-CM | POA: Diagnosis present

## 2021-06-13 DIAGNOSIS — M170 Bilateral primary osteoarthritis of knee: Secondary | ICD-10-CM | POA: Diagnosis present

## 2021-06-13 DIAGNOSIS — R079 Chest pain, unspecified: Secondary | ICD-10-CM | POA: Diagnosis not present

## 2021-06-13 DIAGNOSIS — Z880 Allergy status to penicillin: Secondary | ICD-10-CM | POA: Diagnosis not present

## 2021-06-13 DIAGNOSIS — Z888 Allergy status to other drugs, medicaments and biological substances status: Secondary | ICD-10-CM | POA: Diagnosis not present

## 2021-06-13 DIAGNOSIS — K449 Diaphragmatic hernia without obstruction or gangrene: Secondary | ICD-10-CM | POA: Diagnosis not present

## 2021-06-13 DIAGNOSIS — M109 Gout, unspecified: Secondary | ICD-10-CM | POA: Diagnosis present

## 2021-06-13 DIAGNOSIS — I10 Essential (primary) hypertension: Secondary | ICD-10-CM | POA: Diagnosis present

## 2021-06-13 DIAGNOSIS — R739 Hyperglycemia, unspecified: Secondary | ICD-10-CM | POA: Diagnosis not present

## 2021-06-13 DIAGNOSIS — R0902 Hypoxemia: Secondary | ICD-10-CM | POA: Diagnosis not present

## 2021-06-13 DIAGNOSIS — R7303 Prediabetes: Secondary | ICD-10-CM | POA: Diagnosis present

## 2021-06-13 DIAGNOSIS — I2510 Atherosclerotic heart disease of native coronary artery without angina pectoris: Secondary | ICD-10-CM | POA: Diagnosis present

## 2021-06-13 DIAGNOSIS — Z885 Allergy status to narcotic agent status: Secondary | ICD-10-CM | POA: Diagnosis not present

## 2021-06-13 DIAGNOSIS — R0602 Shortness of breath: Secondary | ICD-10-CM | POA: Diagnosis not present

## 2021-06-13 DIAGNOSIS — Z7901 Long term (current) use of anticoagulants: Secondary | ICD-10-CM | POA: Diagnosis not present

## 2021-06-13 DIAGNOSIS — J189 Pneumonia, unspecified organism: Secondary | ICD-10-CM | POA: Diagnosis present

## 2021-06-13 DIAGNOSIS — Z6841 Body Mass Index (BMI) 40.0 and over, adult: Secondary | ICD-10-CM | POA: Diagnosis not present

## 2021-06-13 DIAGNOSIS — R509 Fever, unspecified: Secondary | ICD-10-CM | POA: Diagnosis not present

## 2021-06-13 DIAGNOSIS — J9601 Acute respiratory failure with hypoxia: Secondary | ICD-10-CM | POA: Diagnosis present

## 2021-06-13 DIAGNOSIS — J441 Chronic obstructive pulmonary disease with (acute) exacerbation: Secondary | ICD-10-CM | POA: Diagnosis not present

## 2021-06-13 DIAGNOSIS — T380X5A Adverse effect of glucocorticoids and synthetic analogues, initial encounter: Secondary | ICD-10-CM | POA: Diagnosis not present

## 2021-06-13 DIAGNOSIS — J440 Chronic obstructive pulmonary disease with acute lower respiratory infection: Secondary | ICD-10-CM | POA: Diagnosis present

## 2021-06-13 LAB — CBC
HCT: 36.9 % (ref 36.0–46.0)
Hemoglobin: 11.9 g/dL — ABNORMAL LOW (ref 12.0–15.0)
MCH: 32.4 pg (ref 26.0–34.0)
MCHC: 32.2 g/dL (ref 30.0–36.0)
MCV: 100.5 fL — ABNORMAL HIGH (ref 80.0–100.0)
Platelets: 145 10*3/uL — ABNORMAL LOW (ref 150–400)
RBC: 3.67 MIL/uL — ABNORMAL LOW (ref 3.87–5.11)
RDW: 15.6 % — ABNORMAL HIGH (ref 11.5–15.5)
WBC: 11.5 10*3/uL — ABNORMAL HIGH (ref 4.0–10.5)
nRBC: 0 % (ref 0.0–0.2)

## 2021-06-13 LAB — RESPIRATORY PANEL BY PCR

## 2021-06-13 LAB — HEPARIN LEVEL (UNFRACTIONATED): Heparin Unfractionated: >1.1 IU/mL — ABNORMAL HIGH (ref 0.30–0.70)

## 2021-06-13 LAB — APTT: aPTT: 71 seconds — ABNORMAL HIGH (ref 24–36)

## 2021-06-13 LAB — TROPONIN I (HIGH SENSITIVITY): Troponin I (High Sensitivity): 103 ng/L (ref <18)

## 2021-06-13 LAB — GLUCOSE, CAPILLARY
Glucose-Capillary: 171 mg/dL — ABNORMAL HIGH (ref 70–99)
Glucose-Capillary: 147 mg/dL — ABNORMAL HIGH (ref 70–99)

## 2021-06-13 LAB — BASIC METABOLIC PANEL
Anion gap: 10 (ref 5–15)
BUN: 19 mg/dL (ref 8–23)
CO2: 23 mmol/L (ref 22–32)
Calcium: 8.7 mg/dL — ABNORMAL LOW (ref 8.9–10.3)
Chloride: 101 mmol/L (ref 98–111)
Creatinine, Ser: 0.84 mg/dL (ref 0.44–1.00)
GFR, Estimated: >60 mL/min (ref >60)
Glucose, Bld: 182 mg/dL — ABNORMAL HIGH (ref 70–99)
Potassium: 5 mmol/L (ref 3.5–5.1)
Sodium: 134 mmol/L — ABNORMAL LOW (ref 135–145)

## 2021-06-13 LAB — ECHOCARDIOGRAM COMPLETE
Area-P 1/2: 4.64 cm2
Height: 66.5 in
Weight: 4176 oz

## 2021-06-13 LAB — HIV ANTIBODY (ROUTINE TESTING W REFLEX): HIV Screen 4th Generation wRfx: NONREACTIVE

## 2021-06-13 MED ORDER — RIVAROXABAN 20 MG PO TABS
20.0000 mg | ORAL_TABLET | Freq: Every day | ORAL | Status: DC
  Administered 2021-06-13 – 2021-06-15 (×3): 20 mg via ORAL
  Filled 2021-06-13 (×3): qty 1

## 2021-06-13 NOTE — Progress Notes (Signed)
SATURATION QUALIFICATIONS: (This note is used to comply with regulatory documentation for home oxygen)  Patient Saturations on Room Air at Rest = 81%    Patient Saturations on 2 Liters of oxygen while Ambulating = 81%  Please briefly explain why patient needs home oxygen: to maintain appropriate levels of SaO2.   Blondell Reveal Kistler PT 06/13/2021  Acute Rehabilitation Services Pager [phone] Office [phone]

## 2021-06-13 NOTE — Progress Notes (Signed)
Called to pt bedside for assistance with cpap.  Cpap initially setup on autotitration mode.  Upon arrival, pressure setting was at 7cm and pt stated it was too much.  Attempt made to adjust pressure down to min/max pressure of 4-6cm, but pt still felt it was too much.  Cpap mask removed per pt request and pt placed back on 4l [place].  Cpap remains in room on standby as needed.

## 2021-06-13 NOTE — Progress Notes (Addendum)
[place] for heparin Indication: chest pain/ACS (home xarelto on hold)  Allergies  Allergen Reactions   Crestor [Rosuvastatin Calcium]     Severe liver function problems   Hydrocodone-Acetaminophen Anxiety and Itching   Lidocaine Hcl Other (See Comments)    Novacaine:  Becomes very shaky   Lipitor [Atorvastatin Calcium] Other (See Comments)    Elevated liver function    Procaine Other (See Comments)    shaking   Rosuvastatin Other (See Comments)    Severe liver function problems   Theophylline Other (See Comments)    Becomes very shaky skaking skaking   Vicodin [Hydrocodone-Acetaminophen] Itching    Itching all over   Codeine Nausea Only, Anxiety and Other (See Comments)    Also complained of dizziness.  Has same problems with oxycodone and hydrocodone Visual Disturbance, Balance Difficulty, Dizzy "Room Spinning; "Swimming" Balance, vision, nausea, dizzy, "swimming", "room spinning" Balance, vision, nausea, dizzy, "swimming", "room spinning"   Oxycodone-Acetaminophen Nausea Only, Anxiety and Other (See Comments)    Visual Disturbance, Balance Difficulty, Dizzy "Room Spinning; "Swimming"   Propoxyphene Anxiety, Nausea Only and Other (See Comments)    Visual Disturbance, Balance Difficulty, Dizzy "Room Spinning; "Swimming" Balance, vision, nausea, dizzy, "swimming", "room spinning" Balance, vision, nausea, dizzy, "swimming, "room spinning"   Quinine Derivatives Nausea Only    dizzy   Sulfa Antibiotics Nausea Only    Also complained of dizziness   Atorvastatin Other (See Comments)    Severe liver function problems   Ephedrine Other (See Comments)    shaking   Other Other (See Comments)    Quinine causes nausea, dizzy   Oxycodone Other (See Comments)    Balance, vision, nausea, dizzy, "swimming", "room spinning"   Quinine Other (See Comments)   Cephalexin Rash and Other (See Comments)   Epinephrine Other (See Comments)    Patient  becomes "shaky" shaking shaking   Penicillins Rash    Pt reported all over body rash in 1958 Has patient had a PCN reaction causing immediate rash, facial/tongue/throat swelling, SOB or lightheadedness with hypotension:Yes Has patient had a PCN reaction causing severe rash involving mucus membranes or skin necrosis: No Has patient had a PCN reaction that required hospitalization: No Has patient had a PCN reaction occurring within the last 10 years:No     Theophyllines Other (See Comments)    Patient becomes "shaky"    Patient Measurements:  Height: 5' 6.5" (168.9 cm) Weight: 118.4 kg (261 lb) IBW/kg (Calculated) : 60.45 Heparin Dosing Weight: 88 kg  Vital Signs: BP: 121/76 (02/08 0930) Pulse Rate: 75 (02/08 0930)  Labs: Recent Labs    06/12/21 1826 06/12/21 2326 06/13/21 0243  HGB 11.9*  --  11.9*  HCT 36.8  --  36.9  PLT 159  --  145*  APTT  --  35  --   HEPARINUNFRC  --  >1.10*  --   CREATININE 0.89  --  0.84  TROPONINIHS 133* 103*  --      Estimated Creatinine Clearance: 67.1 mL/min (by C-G formula based on SCr of 0.84 mg/dL).   Medications:  - on xarelto 20 mg daily PTA (last dose taken on 2/6 at 2130)  Assessment: Patient is an 86 y.o F with hx COPD and afib on xarelto PTA, presented to Urgent Care on 06/12/21 with c/o SOB, fever and cough and subsequent sent to the ED for further workup. Troponin was found to be elevated in the ED. Pharmacy has been consulted to transition patient  to heparin drip for ACS.  Today, 06/13/2021: - aPTT is therapeutic at 71 secs, heparin level >1.10 but this is likely from residual effect of xarelto.  Will adjust heparin dose using aPTT at this time - cbc ok - no bleeding documented  Goal of Therapy:  Heparin level 0.3-0.7 units/ml aPTT 66-102 seconds Monitor platelets by anticoagulation protocol: Yes   Plan:  - Continue heparin drip at 1200 units/hr - check 8 hr aPTT at 4pm today to ensure level is still therapeutic before  changing to daily monitoring - monitor for s/sx bleeding  [name] P 06/13/2021,9:55 AM  _________________________________  Adden: Pt's RN on 4E called at 1630 to report that pt's IV line came out while he was in the ED. ED RN documented that heparin drip was stopped in the ED at ~1530.  There was some bleeding at the site when the line came out, but it looks ok now.  - aPTT was supposed to be collected at 4pm. Since heparin drip has been off ~1.5 hr, will re-time aPTT for [place], PharmD, BCPS 06/13/2021 4:37 PM

## 2021-06-13 NOTE — Assessment & Plan Note (Addendum)
Negative covid, influenza With COPD exacerbation, follow RVP (negative) Due to pneumonia as above

## 2021-06-13 NOTE — Progress Notes (Signed)
PROGRESS NOTE    [name]  WEX:[id number] DOB: 06-Jun-1935 DOA: 06/12/2021 PCP: Mercedes Borg, MD  Chief Complaint  Patient presents with   Cough   low oxygen    Brief Narrative:  86 yo with hx asthma, COPD, HTN, atrial fibrillation on xarelto who presented with SOB, wheezing, fevers, chills who's been admitted with acute hypoxic respiratory failure, being treated for COPD exacerbation.   Assessment & Plan:   Principal Problem:   COPD exacerbation ([name]) Active Problems:   Acute respiratory failure with hypoxia (HCC)   Febrile illness   Elevated troponin   Atrial fibrillation (HCC)   Essential hypertension   Mixed hyperlipidemia   Hypothyroidism   Assessment and Plan: * COPD exacerbation ([place])- (present on admission) Some wheezing still on exam with hypoxia today Continue steroids incruse ellipta, dulera (she's on wixela at home it looks like) Prn nebs  levaquin Cont pulse ox  Acute respiratory failure with hypoxia ([name])- (present on admission) Treating as COPD exacerbation CXR with bibasilar subsegmental atelectasis Low threshold for additional imaging Patient has acute respiratory failure with hypoxia due to having Resean [name] new oxygen requirement.  That is the patient has [name] PaO2 < 60 (pulse Ox < 90%) on room air.  Elevated troponin- (present on admission) Troponin downtrending, relatively low - suspect demand EKG with atrial fibrillation, PVC's, IVCD Denies CP - suspect this is demand ischemia, not c/w ACS Follow echo  I think ok to resume xarelto, will wait until echo complete  Febrile illness- (present on admission) Negative covid, influenza With COPD exacerbation, follow RVP Additional w/u as indicated UA pending  Atrial fibrillation ([name])- (present on admission) Chronic Holding xarelto Cont home rate ctrl meds  Mixed hyperlipidemia- (present on admission) Cont crestor 2x weekly  Essential hypertension- (present on admission) Cont home metoprolol  meds Hold HCTZ for now  Hypothyroidism- (present on admission) synthroid  DVT prophylaxis: heparin Code Status: full Family Communication: none Disposition:   Status is: Observation The patient will require care spanning > 2 midnights and should be moved to inpatient because: need for IV abx, steroids, nebs         Consultants:  none  Procedures:  none  Antimicrobials:  Anti-infectives (From admission, onward)    Start     Dose/Rate Route Frequency Ordered Stop   06/13/21 2100  levofloxacin (LEVAQUIN) IVPB 750 mg        750 mg 100 mL/hr over 90 Minutes Intravenous Every 24 hours 06/12/21 2048 06/17/21 2059   06/12/21 1945  levofloxacin (LEVAQUIN) IVPB 750 mg        750 mg 100 mL/hr over 90 Minutes Intravenous  Once 06/12/21 1937 06/12/21 2148       Subjective: Asking about going home (we discussed she needs continued w/u and treatment and monitoring due to her O2 requirement)  Objective: Vitals:   06/13/21 1145 06/13/21 1200 06/13/21 1215 06/13/21 1230  BP:  (!) 115/98  115/78  Pulse: 84 84 89 89  Resp: (!) 21 14 (!) 21 14  Temp:      TempSrc:      SpO2: 92% 91% 94% 94%  Weight:      Height:        Intake/Output Summary (Last 24 hours) at 06/13/2021 1354 Last data filed at 06/12/2021 2148 Gross per 24 hour  Intake 150 ml  Output --  Net 150 ml   Filed Weights   06/13/21 0942  Weight: 118.4 kg    Examination:  General exam:  Appears calm and comfortable  Respiratory system: scattered expiratory wheezing, increased WOB  Cardiovascular system: irregularly irregular Gastrointestinal system: Abdomen is nondistended, soft and nontender. Central nervous system: Alert and oriented. No focal neurological deficits. Extremities: no LEE Skin: No rashes, lesions or ulcers Psychiatry: Judgement and insight appear normal. Mood & affect appropriate.     Data Reviewed: I have personally reviewed following labs and imaging studies  CBC: Recent Labs  Lab  06/12/21 1826 06/13/21 0243  WBC 11.2* 11.5*  NEUTROABS 8.7*  --   HGB 11.9* 11.9*  HCT 36.8 36.9  MCV 99.7 100.5*  PLT 159 145*    Basic Metabolic Panel: Recent Labs  Lab 06/06/21 1607 06/12/21 1826 06/13/21 0243  NA 147* 134* 134*  K 3.5 3.3* 5.0  CL 107* 100 101  CO2 24 25 23   GLUCOSE 93 104* 182*  BUN 14 21 19   CREATININE 0.74 0.89 0.84  CALCIUM 9.3 8.9 8.7*    GFR: Estimated Creatinine Clearance: 67.1 mL/min (by C-G formula based on SCr of 0.84 mg/dL).  Liver Function Tests: Recent Labs  Lab 06/12/21 1826  AST 29  ALT 15  ALKPHOS 46  BILITOT 2.6*  PROT 6.7  ALBUMIN 3.5    CBG: Recent Labs  Lab 06/12/21 1437  GLUCAP 97     Recent Results (from the past 240 hour(s))  Resp Panel by RT-PCR (Flu [name], Covid) Nasopharyngeal Swab     Status: None   Collection Time: 06/12/21  6:25 PM   Specimen: Nasopharyngeal Swab; Nasopharyngeal(NP) swabs in vial transport medium  Result Value Ref Range Status   SARS Coronavirus 2 by RT PCR NEGATIVE NEGATIVE Final    Comment: (NOTE) SARS-CoV-2 target nucleic acids are NOT DETECTED.  The SARS-CoV-2 RNA is generally detectable in upper respiratory specimens during the acute phase of infection. The lowest concentration of SARS-CoV-2 viral copies this assay can detect is 138 copies/mL. [name] negative result does not preclude SARS-Cov-2 infection and should not be used as the sole basis for treatment or other patient management decisions. [name] negative result may occur with  improper specimen collection/handling, submission of specimen other than nasopharyngeal swab, presence of viral mutation(s) within the areas targeted by this assay, and inadequate number of viral copies(<138 copies/mL). [name] negative result must be combined with clinical observations, patient history, and epidemiological information. The expected result is Negative.  Fact Sheet for Patients:  [name]  Fact Sheet for  Healthcare Providers:  [name]  This test is no t yet approved or cleared by the [name] FDA and  has been authorized for detection and/or diagnosis of SARS-CoV-2 by FDA under an Emergency Use Authorization (EUA). This EUA will remain  in effect (meaning this test can be used) for the duration of the COVID-19 declaration under Section 564(b)(1) of the Act, 21 U.S.C.section 360bbb-3(b)(1), unless the authorization is terminated  or revoked sooner.       Influenza Crystallee Werden by PCR NEGATIVE NEGATIVE Final   Influenza B by PCR NEGATIVE NEGATIVE Final    Comment: (NOTE) The Xpert Xpress SARS-CoV-2/FLU/RSV plus assay is intended as an aid in the diagnosis of influenza from Nasopharyngeal swab specimens and should not be used as Yanelie Abraha sole basis for treatment. Nasal washings and aspirates are unacceptable for Xpert Xpress SARS-CoV-2/FLU/RSV testing.  Fact Sheet for Patients: [name]  Fact Sheet for Healthcare Providers: [name]  This test is not yet approved or cleared by the [name] FDA and has been authorized for detection and/or diagnosis of SARS-CoV-2 by  FDA under an Emergency Use Authorization (EUA). This EUA will remain in effect (meaning this test can be used) for the duration of the COVID-19 declaration under Section 564(b)(1) of the Act, 21 U.S.C. section 360bbb-3(b)(1), unless the authorization is terminated or revoked.  Performed at [facility], [place] [address], [place], [place] [address]   Respiratory (~20 pathogens) panel by PCR     Status: None   Collection Time: 06/13/21  9:41 AM   Specimen: Nasopharyngeal Swab; Respiratory  Result Value Ref Range Status   Adenovirus NOT DETECTED NOT DETECTED Final   Coronavirus 229E NOT DETECTED NOT DETECTED Final    Comment: (NOTE) The Coronavirus on the Respiratory Panel, DOES NOT test for the novel   Coronavirus (2019 nCoV)    Coronavirus HKU1 NOT DETECTED NOT DETECTED Final   Coronavirus NL63 NOT DETECTED NOT DETECTED Final   Coronavirus OC43 NOT DETECTED NOT DETECTED Final   Metapneumovirus NOT DETECTED NOT DETECTED Final   Rhinovirus / Enterovirus NOT DETECTED NOT DETECTED Final   Influenza Clevie Prout NOT DETECTED NOT DETECTED Final   Influenza B NOT DETECTED NOT DETECTED Final   Parainfluenza Virus 1 NOT DETECTED NOT DETECTED Final   Parainfluenza Virus 2 NOT DETECTED NOT DETECTED Final   Parainfluenza Virus 3 NOT DETECTED NOT DETECTED Final   Parainfluenza Virus 4 NOT DETECTED NOT DETECTED Final   Respiratory Syncytial Virus NOT DETECTED NOT DETECTED Final   Bordetella pertussis NOT DETECTED NOT DETECTED Final   Bordetella Parapertussis NOT DETECTED NOT DETECTED Final   Chlamydophila pneumoniae NOT DETECTED NOT DETECTED Final   Mycoplasma pneumoniae NOT DETECTED NOT DETECTED Final    Comment: Performed at [facility], [place]. [address] [place], [place] [address]         Radiology Studies: DG Chest 2 View  Result Date: 06/12/2021 CLINICAL DATA:  Cough, shortness of breath. EXAM: CHEST - 2 VIEW COMPARISON:  May 17, 2021. FINDINGS: Stable cardiomegaly is noted. Mild bibasilar subsegmental atelectasis is noted. Bony thorax is unremarkable. IMPRESSION: Mild bibasilar subsegmental atelectasis. Electronically Signed   By: Marijo Conception M.D.   On: 06/12/2021 13:39        Scheduled Meds:  allopurinol  300 mg Oral Daily   azelastine  2 spray Each Nare Daily   gabapentin  300 mg Oral QHS   levothyroxine  75 mcg Oral Q0600   loratadine  10 mg Oral QHS   metoprolol succinate  12.5 mg Oral Daily   mometasone-formoterol  2 puff Inhalation BID   predniSONE  40 mg Oral Q breakfast   [START ON 06/14/2021] rosuvastatin  20 mg Oral Once per day on Mon Thu   umeclidinium bromide  1 puff Inhalation Daily   Continuous Infusions:  heparin 1,200 Units/hr (06/13/21 0001)    levofloxacin (LEVAQUIN) IV       LOS: 0 days    Time spent: over 30 min    Fayrene Helper, MD [facility]   To contact the attending provider between 7A-7P or the covering provider during after hours 7P-7A, please log into the web site [email or web address] and access using universal [place] password for that web site. If you do not have the password, please call the hospital operator.  06/13/2021, 1:54 PM

## 2021-06-13 NOTE — Progress Notes (Signed)
°  Echocardiogram 2D Echocardiogram has been performed.  Darlina Sicilian M 06/13/2021, 3:00 PM

## 2021-06-13 NOTE — Hospital Course (Addendum)
86 yo with hx asthma, COPD, HTN, atrial fibrillation on xarelto who presented with SOB, wheezing, fevers, chills who's been admitted with acute hypoxic respiratory failure, being treated for COPD exacerbation.  She eventually had CT scan which showed multifocal pneumonia.  She's gradually improved with steroids and antibiotics.  Discharged with o2 with activity.  See below for additional details

## 2021-06-13 NOTE — ED Notes (Signed)
Pt's IV was noted to be removed by accident by pt. I applied gauze with wrap. Informed RN.

## 2021-06-13 NOTE — Evaluation (Signed)
Physical Therapy Evaluation Patient Details Name: [name] MRN: [id number] DOB: 1935/09/24 Today's Date: 06/13/2021  History of Present Illness  86 y.o. female with medical history significant of Asthma, COPD, HTN, a.fib on Xarelto. Pt presents to ED with c/o cough productive of yellow sputum, SOB, wheezing.  Had fevers, chills, rigors. Dx of respiratory failure, COPD exacerbation.  Clinical Impression  Pt admitted with above diagnosis. Pt ambulated 18' with RW, SaO2 81% on 2L O2 while ambulating. SaO2 95% on 2L at rest. At present, she qualifies for home O2. At baseline she mobilizes independently with a rollator, her family assists with transportation. I expect she can return home at a modified independent level.  Pt currently with functional limitations due to the deficits listed below (see PT Problem List). Pt will benefit from skilled PT to increase their independence and safety with mobility to allow discharge to the venue listed below.          Recommendations for follow up therapy are one component of a multi-disciplinary discharge planning process, led by the attending physician.  Recommendations may be updated based on patient status, additional functional criteria and insurance authorization.  Follow Up Recommendations Home health PT    Assistance Recommended at Discharge    Patient can return home with the following  Assist for transportation;A little help with bathing/dressing/bathroom    Equipment Recommendations None recommended by PT  Recommendations for Other Services       Functional Status Assessment       Precautions / Restrictions Precautions Precautions: Fall Precaution Comments: no falls in past 1 year Restrictions Weight Bearing Restrictions: No      Mobility  Bed Mobility Overal bed mobility: Modified Independent             General bed mobility comments: HOB up    Transfers Overall transfer level: Needs assistance Equipment used: Rolling  walker (2 wheels) Transfers: Sit to/from Stand Sit to Stand: Supervision           General transfer comment: supervision for safety    Ambulation/Gait Ambulation/Gait assistance: Supervision Gait Distance (Feet): 90 Feet Assistive device: Rolling walker (2 wheels) Gait Pattern/deviations: Step-through pattern, Decreased stride length Gait velocity: decr     General Gait Details: steady with RW, ambulated with 2L O2, SaO2 81% on room air at rest, 95% on 2L at rest, 81% walking on 2L O2, VCs for PLB  Stairs            Wheelchair Mobility    Modified Rankin (Stroke Patients Only)       Balance Overall balance assessment: Modified Independent                                           Pertinent Vitals/Pain Pain Assessment Pain Assessment: No/denies pain    Home Living Family/patient expects to be discharged to:: Private residence Living Arrangements: Alone   Type of Home: House Home Access: Ramped entrance     Alternate Level Stairs-Number of Steps: 3 Home Layout: Two level Home Equipment: Rollator (4 wheels);Rolling Walker (2 wheels)      Prior Function Prior Level of Function : Independent/Modified Independent             Mobility Comments: walks with rollator ADLs Comments: assist for housecleaning, independent bathing/dressing, family is nearby and assists with driving, pt doesn't drive     Hand Dominance  Extremity/Trunk Assessment   Upper Extremity Assessment Upper Extremity Assessment: Defer to OT evaluation    Lower Extremity Assessment Lower Extremity Assessment: Overall WFL for tasks assessed    Cervical / Trunk Assessment Cervical / Trunk Assessment: Normal  Communication   Communication: No difficulties  Cognition Arousal/Alertness: Awake/alert Behavior During Therapy: WFL for tasks assessed/performed Overall Cognitive Status: Within Functional Limits for tasks assessed                                           General Comments      Exercises     Assessment/Plan    PT Assessment Patient needs continued PT services  PT Problem List Cardiopulmonary status limiting activity;Decreased activity tolerance;Decreased mobility       PT Treatment Interventions Gait training;Therapeutic exercise;Therapeutic activities;Functional mobility training;Patient/family education    PT Goals (Current goals can be found in the Care Plan section)  Acute Rehab PT Goals Patient Stated Goal: return home PT Goal Formulation: With patient Time For Goal Achievement: 06/27/21 Potential to Achieve Goals: Good    Frequency Min 3X/week     Co-evaluation PT/OT/SLP Co-Evaluation/Treatment: Yes Reason for Co-Treatment: For patient/therapist safety;To address functional/ADL transfers PT goals addressed during session: Mobility/safety with mobility;Balance;Proper use of DME         AM-PAC PT "6 Clicks" Mobility  Outcome Measure Help needed turning from your back to your side while in a flat bed without using bedrails?: None Help needed moving from lying on your back to sitting on the side of a flat bed without using bedrails?: A Little Help needed moving to and from a bed to a chair (including a wheelchair)?: A Little Help needed standing up from a chair using your arms (e.g., wheelchair or bedside chair)?: A Little Help needed to walk in hospital room?: A Little Help needed climbing 3-5 steps with a railing? : A Little 6 Click Score: 19    End of Session Equipment Utilized During Treatment: Oxygen Activity Tolerance: Patient tolerated treatment well Patient left: in chair;with call bell/phone within reach Nurse Communication: Mobility status PT Visit Diagnosis: Difficulty in walking, not elsewhere classified (R26.2)    Time: 4076-8088 PT Time Calculation (min) (ACUTE ONLY): 27 min   Charges:   PT Evaluation $PT Eval Moderate Complexity: 1 Mod        Philomena Doheny PT 06/13/2021  Acute Rehabilitation Services Pager [phone] Office [phone]

## 2021-06-13 NOTE — Assessment & Plan Note (Signed)
synthroid °

## 2021-06-13 NOTE — Progress Notes (Signed)
Occupational Therapy Evaluation  Patient lives at home alone, recently widowed. Utilized rollator for ambulation and is mod I for BADLs. Overall patient supervision level for functional ambulation and self care tasks, with primary limitation being endurance 2* cardiopulmonary status. Patient desats on room air to 81%, would benefit from energy conservation and pursed lip breathing education. No OT services indicated at D/C at this time.    06/13/21 1300  OT Visit Information  Last OT Received On 06/13/21  Assistance Needed +1  PT/OT/SLP Co-Evaluation/Treatment Yes  Reason for Co-Treatment For patient/therapist safety;To address functional/ADL transfers  PT goals addressed during session Mobility/safety with mobility  OT goals addressed during session ADL's and self-care  History of Present Illness 86 y.o. female with medical history significant of Asthma, COPD, HTN, a.fib on Xarelto. Pt presents to ED with c/o cough productive of yellow sputum, SOB, wheezing.  Had fevers, chills, rigors. Dx of respiratory failure, COPD exacerbation.  Precautions  Precautions Fall  Precaution Comments no falls in past 1 year, monitor O2 sats  Restrictions  Weight Bearing Restrictions No  Home Living  Family/patient expects to be discharged to: Private residence  Living Arrangements Alone  Available Help at Discharge Family  Type of [place] entrance  [place] Two level  Alternate Level Stairs-Number of Steps 3  Bathroom Shower/Tub Walk-in Land (4 wheels);Rolling Walker (2 wheels);Grab bars - tub/shower;Grab bars - toilet;Shower seat - built in  Prior Function  Prior Level of Function  Independent/Modified Independent  Mobility Comments walks with rollator  ADLs Comments assist for housecleaning, independent bathing/dressing, family is nearby and assists with driving, pt doesn't drive  Communication  Communication No  difficulties  Pain Assessment  Pain Assessment No/denies pain  Cognition  Arousal/Alertness Awake/alert  Behavior During Therapy WFL for tasks assessed/performed  Overall Cognitive Status Within Functional Limits for tasks assessed  Upper Extremity Assessment  Upper Extremity Assessment Overall WFL for tasks assessed  Lower Extremity Assessment  Lower Extremity Assessment Defer to PT evaluation  Cervical / Trunk Assessment  Cervical / Trunk Assessment Normal  ADL  Overall ADL's  Needs assistance/impaired  Grooming Wash/dry hands;Supervision/safety;Standing  Upper Body Bathing Set up;Sitting  Lower Body Bathing Supervison/ safety;Sitting/lateral leans;Sit to/from stand  Upper Body Dressing  Set up;Sitting  Lower Body Dressing Supervision/safety;Sit to/from stand;Sitting/lateral Quarry manager Supervision/safety;Ambulation;Regular Toilet;Rolling walker (2 wheels)  Toilet Transfer Details (indicate cue type and reason) S for safety  Toileting- Clothing Manipulation and Hygiene Supervision/safety;Sitting/lateral lean;Sit to/from stand  Functional mobility during ADLs Supervision/safety  General ADL Comments Patient overall supervision level for safety with functional ambulation and mobility, would benefit from pursed lip breathing and  energy conservation education d/t COPD and currently requiring supplemental O2  Bed Mobility  Overal bed mobility Modified Independent  General bed mobility comments HOB up  Balance  Overall balance assessment Modified Independent  General Comments  General comments (skin integrity, edema, etc.) Patient desat to 81% on room air in standing  OT - End of Session  Equipment Utilized During Treatment Oxygen;Rolling walker (2 wheels)  Activity Tolerance Patient tolerated treatment well  Patient left in chair;with call bell/phone within reach  Nurse Communication Mobility status  OT Assessment  OT Recommendation/Assessment Patient needs continued OT  Services  OT Visit Diagnosis Other abnormalities of gait and mobility (R26.89)  OT Problem List Cardiopulmonary status limiting activity;Decreased activity tolerance;Obesity  OT Plan  OT Frequency (ACUTE ONLY) Min 2X/week  OT  Treatment/Interventions (ACUTE ONLY) Self-care/ADL training;Balance training;Patient/family education;Therapeutic activities;Energy conservation  AM-PAC OT "6 Clicks" Daily Activity Outcome Measure (Version 2)  Help from another person eating meals? 4  Help from another person taking care of personal grooming? 3  Help from another person toileting, which includes using toliet, bedpan, or urinal? 3  Help from another person bathing (including washing, rinsing, drying)? 3  Help from another person to put on and taking off regular upper body clothing? 3  Help from another person to put on and taking off regular lower body clothing? 3  6 Click Score 19  Progressive Mobility  What is the highest level of mobility based on the progressive mobility assessment? Level 5 (Walks with assist in room/hall) - Balance while stepping forward/back and can walk in room with assist - Complete  Activity Ambulated with assistance in hallway;Ambulated with assistance in room;Ambulated with assistance to bathroom;Transferred from bed to chair  OT Recommendation  Follow Up Recommendations No OT follow up  Assistance recommended at discharge None  Functional Status Assessent Patient has had a recent decline in their functional status and demonstrates the ability to make significant improvements in function in a reasonable and predictable amount of time.  OT Equipment None recommended by OT  Individuals Consulted  Consulted and Agree with Results and Recommendations Patient  Acute Rehab OT Goals  Patient Stated Goal Home  OT Goal Formulation With patient  Time For Goal Achievement 06/27/21  Potential to Achieve Goals Good  OT Time Calculation  OT Start Time (ACUTE ONLY) 1052  OT Stop Time  (ACUTE ONLY) 1122  OT Time Calculation (min) 30 min  OT General Charges  $OT Visit 1 Visit  OT Evaluation  $OT Eval Low Complexity 1 Low  Written Expression  Dominant Hand Right   Delbert Phenix OT OT pager: [phone]

## 2021-06-13 NOTE — Assessment & Plan Note (Addendum)
Follow A1c (5.8) Steroid induced hyperglycemia

## 2021-06-14 ENCOUNTER — Ambulatory Visit: Payer: Medicare Other | Admitting: Internal Medicine

## 2021-06-14 LAB — CBC
HCT: 36 % (ref 36.0–46.0)
Hemoglobin: 11.5 g/dL — ABNORMAL LOW (ref 12.0–15.0)
MCH: 32.1 pg (ref 26.0–34.0)
MCHC: 31.9 g/dL (ref 30.0–36.0)
MCV: 100.6 fL — ABNORMAL HIGH (ref 80.0–100.0)
Platelets: 159 10*3/uL (ref 150–400)
RBC: 3.58 MIL/uL — ABNORMAL LOW (ref 3.87–5.11)
RDW: 15 % (ref 11.5–15.5)
WBC: 14.4 10*3/uL — ABNORMAL HIGH (ref 4.0–10.5)
nRBC: 0 % (ref 0.0–0.2)

## 2021-06-14 LAB — COMPREHENSIVE METABOLIC PANEL
ALT: 14 U/L (ref 0–44)
AST: 21 U/L (ref 15–41)
Albumin: 3.4 g/dL — ABNORMAL LOW (ref 3.5–5.0)
Alkaline Phosphatase: 46 U/L (ref 38–126)
Anion gap: 6 (ref 5–15)
BUN: 24 mg/dL — ABNORMAL HIGH (ref 8–23)
CO2: 26 mmol/L (ref 22–32)
Calcium: 9 mg/dL (ref 8.9–10.3)
Chloride: 105 mmol/L (ref 98–111)
Creatinine, Ser: 0.83 mg/dL (ref 0.44–1.00)
GFR, Estimated: >60 mL/min (ref >60)
Glucose, Bld: 132 mg/dL — ABNORMAL HIGH (ref 70–99)
Potassium: 3.7 mmol/L (ref 3.5–5.1)
Sodium: 137 mmol/L (ref 135–145)
Total Bilirubin: 1.2 mg/dL (ref 0.3–1.2)
Total Protein: 6.7 g/dL (ref 6.5–8.1)

## 2021-06-14 LAB — GLUCOSE, CAPILLARY
Glucose-Capillary: 124 mg/dL — ABNORMAL HIGH (ref 70–99)
Glucose-Capillary: 128 mg/dL — ABNORMAL HIGH (ref 70–99)
Glucose-Capillary: 192 mg/dL — ABNORMAL HIGH (ref 70–99)
Glucose-Capillary: 143 mg/dL — ABNORMAL HIGH (ref 70–99)

## 2021-06-14 LAB — HEMOGLOBIN A1C
Hgb A1c MFr Bld: 5.8 % — ABNORMAL HIGH (ref 4.8–5.6)
Mean Plasma Glucose: 119.76 mg/dL

## 2021-06-14 LAB — PHOSPHORUS: Phosphorus: 3 mg/dL (ref 2.5–4.6)

## 2021-06-14 LAB — MAGNESIUM: Magnesium: 2.3 mg/dL (ref 1.7–2.4)

## 2021-06-14 MED ORDER — ADULT MULTIVITAMIN W/MINERALS CH
1.0000 | ORAL_TABLET | Freq: Every day | ORAL | Status: DC
  Administered 2021-06-14 – 2021-06-16 (×3): 1 via ORAL
  Filled 2021-06-14 (×3): qty 1

## 2021-06-14 NOTE — Progress Notes (Signed)
Pt home cpap set up and ready for use.

## 2021-06-14 NOTE — Progress Notes (Signed)
PROGRESS NOTE    CAYDENCE KOENIG  HFS:[id number] DOB: November 07, 1935 DOA: 06/12/2021 PCP: Biagio Borg, MD  Chief Complaint  Patient presents with   Cough   low oxygen    Brief Narrative:  86 yo with hx asthma, COPD, HTN, atrial fibrillation on xarelto who presented with SOB, wheezing, fevers, chills who's been admitted with acute hypoxic respiratory failure, being treated for COPD exacerbation.   Assessment & Plan:   Principal Problem:   COPD exacerbation ([place]) Active Problems:   Acute respiratory failure with hypoxia (HCC)   Febrile illness   Elevated troponin   Atrial fibrillation (HCC)   Essential hypertension   Mixed hyperlipidemia   Hypothyroidism   Hyperglycemia   Assessment and Plan: * COPD exacerbation ([place])- (present on admission) Wheezing improved, but still present Continue steroids incruse ellipta, dulera (she's on wixela at home it looks like) Prn nebs  levaquin Cont pulse ox Wean O2 as tolerated  Acute respiratory failure with hypoxia ([place])- (present on admission) Treating as COPD exacerbation CXR with bibasilar subsegmental atelectasis Low threshold for additional imaging if unable to wean to RA Patient has acute respiratory failure with hypoxia due to having Jaydah Stahle new oxygen requirement.  That is the patient has Amela Handley PaO2 < 60 (pulse Ox < 90%) on room air.  Elevated troponin- (present on admission) Troponin downtrending, relatively low - suspect demand EKG with atrial fibrillation, PVC's, IVCD Denies CP - suspect this is demand ischemia, not c/w ACS Follow echo -> echo with EF 60-65%, no RWMA, mild concentric LVH, moderately elevated PASP  Febrile illness- (present on admission) Negative covid, influenza With COPD exacerbation, follow RVP (negative) Additional w/u as indicated UA pending  Atrial fibrillation ([place])- (present on admission) Chronic xarelto Cont home rate ctrl meds  Mixed hyperlipidemia- (present on admission) Cont crestor 2x  weekly  Essential hypertension- (present on admission) Cont home metoprolol meds Hold HCTZ for now  Hypothyroidism- (present on admission) synthroid  Hyperglycemia- (present on admission) Follow A1c, POC BG, consider SSI - likely steroid induced  DVT prophylaxis: heparin Code Status: full Family Communication: none Disposition:   Status is: Observation The patient will require care spanning > 2 midnights and should be moved to inpatient because: need for IV abx, steroids, nebs         Consultants:  none  Procedures:  Echo IMPRESSIONS     1. Left ventricular ejection fraction, by estimation, is 60 to 65%. The  left ventricle has normal function. The left ventricle has no regional  wall motion abnormalities. There is mild concentric left ventricular  hypertrophy. Left ventricular diastolic  parameters are indeterminate.   2. Right ventricular systolic function is normal. The right ventricular  size is normal. There is moderately elevated pulmonary artery systolic  pressure.   3. Left atrial size was moderately dilated.   4. Right atrial size was mildly dilated.   5. No evidence of mitral valve regurgitation.   6. There is mild calcification of the aortic valve. Aortic valve  regurgitation is not visualized.   7. The inferior vena cava is dilated in size with <50% respiratory  variability, suggesting right atrial pressure of 15 mmHg.   Conclusion(s)/Recommendation(s): Compared to prior echo RA pressure  increased.   Antimicrobials:  Anti-infectives (From admission, onward)    Start     Dose/Rate Route Frequency Ordered Stop   06/13/21 2100  levofloxacin (LEVAQUIN) IVPB 750 mg        750 mg 100 mL/hr over 90 Minutes Intravenous  Every 24 hours 06/12/21 2048 06/17/21 2059   06/12/21 1945  levofloxacin (LEVAQUIN) IVPB 750 mg        750 mg 100 mL/hr over 90 Minutes Intravenous  Once 06/12/21 1937 06/12/21 2148       Subjective: No new  complaints  Objective: Vitals:   06/14/21 0807 06/14/21 0852 06/14/21 1013 06/14/21 1158  BP:  (!) 155/82  (!) 154/83  Pulse:    85  Resp:  16  18  Temp:    97.8 F (36.6 C)  TempSrc:    Oral  SpO2: 91%  94% 95%  Weight:      Height:        Intake/Output Summary (Last 24 hours) at 06/14/2021 1314 Last data filed at 06/14/2021 0800 Gross per 24 hour  Intake 813.79 ml  Output --  Net 813.79 ml   Filed Weights   06/13/21 0942  Weight: 118.4 kg    Examination:  General: No acute distress. Cardiovascular: RRR Lungs: scattered expiratory wheezing, on 2 L  Abdomen: Soft, nontender, nondistended  Neurological: Alert and oriented 3. Moves all extremities 4 . Cranial nerves II through XII grossly intact. Skin: Warm and dry. No rashes or lesions. Extremities: No clubbing or cyanosis. No edema.  Data Reviewed: I have personally reviewed following labs and imaging studies  CBC: Recent Labs  Lab 06/12/21 1826 06/13/21 0243 06/14/21 0425  WBC 11.2* 11.5* 14.4*  NEUTROABS 8.7*  --   --   HGB 11.9* 11.9* 11.5*  HCT 36.8 36.9 36.0  MCV 99.7 100.5* 100.6*  PLT 159 145* 702    Basic Metabolic Panel: Recent Labs  Lab 06/12/21 1826 06/13/21 0243 06/14/21 0425  NA 134* 134* 137  K 3.3* 5.0 3.7  CL 100 101 105  CO2 25 23 26   GLUCOSE 104* 182* 132*  BUN 21 19 24*  CREATININE 0.89 0.84 0.83  CALCIUM 8.9 8.7* 9.0  MG  --   --  2.3  PHOS  --   --  3.0    GFR: Estimated Creatinine Clearance: 67.9 mL/min (by C-G formula based on SCr of 0.83 mg/dL).  Liver Function Tests: Recent Labs  Lab 06/12/21 1826 06/14/21 0425  AST 29 21  ALT 15 14  ALKPHOS 46 46  BILITOT 2.6* 1.2  PROT 6.7 6.7  ALBUMIN 3.5 3.4*    CBG: Recent Labs  Lab 06/12/21 1437 06/13/21 1649 06/13/21 2329 06/14/21 0729 06/14/21 1155  GLUCAP 97 171* 147* 124* 128*     Recent Results (from the past 240 hour(s))  Resp Panel by RT-PCR (Flu Shondra Capps&B, Covid) Nasopharyngeal Swab     Status: None    Collection Time: 06/12/21  6:25 PM   Specimen: Nasopharyngeal Swab; Nasopharyngeal(NP) swabs in vial transport medium  Result Value Ref Range Status   SARS Coronavirus 2 by RT PCR NEGATIVE NEGATIVE Final    Comment: (NOTE) SARS-CoV-2 target nucleic acids are NOT DETECTED.  The SARS-CoV-2 RNA is generally detectable in upper respiratory specimens during the acute phase of infection. The lowest concentration of SARS-CoV-2 viral copies this assay can detect is 138 copies/mL. Dwayna Kentner negative result does not preclude SARS-Cov-2 infection and should not be used as the sole basis for treatment or other patient management decisions. Shemica Meath negative result may occur with  improper specimen collection/handling, submission of specimen other than nasopharyngeal swab, presence of viral mutation(s) within the areas targeted by this assay, and inadequate number of viral copies(<138 copies/mL). Kwabena Strutz negative result must be combined with clinical  observations, patient history, and epidemiological information. The expected result is Negative.  Fact Sheet for Patients:  [name]  Fact Sheet for Healthcare Providers:  [name]  This test is no t yet approved or cleared by the [name] FDA and  has been authorized for detection and/or diagnosis of SARS-CoV-2 by FDA under an Emergency Use Authorization (EUA). This EUA will remain  in effect (meaning this test can be used) for the duration of the COVID-19 declaration under Section 564(b)(1) of the Act, 21 U.S.C.section 360bbb-3(b)(1), unless the authorization is terminated  or revoked sooner.       Influenza Marsheila Alejo by PCR NEGATIVE NEGATIVE Final   Influenza B by PCR NEGATIVE NEGATIVE Final    Comment: (NOTE) The Xpert Xpress SARS-CoV-2/FLU/RSV plus assay is intended as an aid in the diagnosis of influenza from Nasopharyngeal swab specimens and should not be used as Sharren Schnurr sole basis for treatment.  Nasal washings and aspirates are unacceptable for Xpert Xpress SARS-CoV-2/FLU/RSV testing.  Fact Sheet for Patients: [name]  Fact Sheet for Healthcare Providers: [name]  This test is not yet approved or cleared by the [name] FDA and has been authorized for detection and/or diagnosis of SARS-CoV-2 by FDA under an Emergency Use Authorization (EUA). This EUA will remain in effect (meaning this test can be used) for the duration of the COVID-19 declaration under Section 564(b)(1) of the Act, 21 U.S.C. section 360bbb-3(b)(1), unless the authorization is terminated or revoked.  Performed at [facility], [place] [address], [place], [place] [address]   Respiratory (~20 pathogens) panel by PCR     Status: None   Collection Time: 06/13/21  9:41 AM   Specimen: Nasopharyngeal Swab; Respiratory  Result Value Ref Range Status   Adenovirus NOT DETECTED NOT DETECTED Final   Coronavirus 229E NOT DETECTED NOT DETECTED Final    Comment: (NOTE) The Coronavirus on the Respiratory Panel, DOES NOT test for the novel  Coronavirus (2019 nCoV)    Coronavirus HKU1 NOT DETECTED NOT DETECTED Final   Coronavirus NL63 NOT DETECTED NOT DETECTED Final   Coronavirus OC43 NOT DETECTED NOT DETECTED Final   Metapneumovirus NOT DETECTED NOT DETECTED Final   Rhinovirus / Enterovirus NOT DETECTED NOT DETECTED Final   Influenza Terrisha [place] NOT DETECTED NOT DETECTED Final   Influenza B NOT DETECTED NOT DETECTED Final   Parainfluenza Virus 1 NOT DETECTED NOT DETECTED Final   Parainfluenza Virus 2 NOT DETECTED NOT DETECTED Final   Parainfluenza Virus 3 NOT DETECTED NOT DETECTED Final   Parainfluenza Virus 4 NOT DETECTED NOT DETECTED Final   Respiratory Syncytial Virus NOT DETECTED NOT DETECTED Final   Bordetella pertussis NOT DETECTED NOT DETECTED Final   Bordetella Parapertussis NOT DETECTED NOT DETECTED Final   Chlamydophila  pneumoniae NOT DETECTED NOT DETECTED Final   Mycoplasma pneumoniae NOT DETECTED NOT DETECTED Final    Comment: Performed at [facility], [place], [place], [place] [address]         Radiology Studies: DG Chest 2 View  Result Date: 06/12/2021 CLINICAL DATA:  Cough, shortness of breath. EXAM: CHEST - 2 VIEW COMPARISON:  May 17, 2021. FINDINGS: Stable cardiomegaly is noted. Mild bibasilar subsegmental atelectasis is noted. Bony thorax is unremarkable. IMPRESSION: Mild bibasilar subsegmental atelectasis. Electronically Signed   By: Marijo Conception M.D.   On: 06/12/2021 13:39   ECHOCARDIOGRAM COMPLETE  Result Date: 06/13/2021    ECHOCARDIOGRAM REPORT   Patient Name:   Ninah B Reaume Date of Exam: 06/13/2021 Medical  Rec #:  [id number]   Height:       66.5 in Accession #:    [id number]  Weight:       261.0 lb Date of Birth:  10-Jul-1935   BSA:          2.252 m Patient Age:    89 years    BP:           148/106 mmHg Patient Gender: F           HR:           90 bpm. Exam Location:  Inpatient Procedure: 2D Echo, 3D Echo, Cardiac Doppler and Color Doppler Indications:    Elevated Troponin  History:        Patient has prior history of Echocardiogram examinations, most                 recent 12/10/2019. CAD, COPD, Arrythmias:Bradycardia and Atrial                 Fibrillation; Risk Factors:Hypertension, Sleep Apnea and Former                 Smoker. GOUT. Past history of breast cancer.  Sonographer:    Darlina Sicilian RDCS Referring Phys: [phone] Yanis Juma CALDWELL POWELL [place]  1. Left ventricular ejection fraction, by estimation, is 60 to 65%. The left ventricle has normal function. The left ventricle has no regional wall motion abnormalities. There is mild concentric left ventricular hypertrophy. Left ventricular diastolic parameters are indeterminate.  2. Right ventricular systolic function is normal. The right ventricular size is normal. There is moderately elevated pulmonary artery systolic pressure.   3. Left atrial size was moderately dilated.  4. Right atrial size was mildly dilated.  5. No evidence of mitral valve regurgitation.  6. There is mild calcification of the aortic valve. Aortic valve regurgitation is not visualized.  7. The inferior vena cava is dilated in size with <50% respiratory variability, suggesting right atrial pressure of 15 mmHg. Conclusion(s)/Recommendation(s): Compared to prior echo RA pressure increased. FINDINGS  Left Ventricle: Left ventricular ejection fraction, by estimation, is 60 to 65%. The left ventricle has normal function. The left ventricle has no regional wall motion abnormalities. The left ventricular internal cavity size was normal in size. There is  mild concentric left ventricular hypertrophy. Left ventricular diastolic parameters are indeterminate. Right Ventricle: The right ventricular size is normal. No increase in right ventricular wall thickness. Right ventricular systolic function is normal. There is moderately elevated pulmonary artery systolic pressure. The tricuspid regurgitant velocity is 3.05 m/s, and with an assumed right atrial pressure of 15 mmHg, the estimated right ventricular systolic pressure is 37.6 mmHg. Left Atrium: Left atrial size was moderately dilated. Right Atrium: Right atrial size was mildly dilated. Pericardium: There is no evidence of pericardial effusion. Mitral Valve: No evidence of mitral valve regurgitation. Tricuspid Valve: Tricuspid valve regurgitation is mild. Aortic Valve: There is mild calcification of the aortic valve. Aortic valve regurgitation is not visualized. Pulmonic Valve: The pulmonic valve was grossly normal. Pulmonic valve regurgitation is mild. Aorta: The aortic root and ascending aorta are structurally normal, with no evidence of dilitation. Venous: The inferior vena cava is dilated in size with less than 50% respiratory variability, suggesting right atrial pressure of 15 mmHg. IAS/Shunts: The interatrial septum was not  well visualized.  LEFT VENTRICLE PLAX 2D LVIDd:         4.15 cm   Diastology LV PW:  1.20 cm   LV e' medial:    9.96 cm/s LV IVS:        1.20 cm   LV E/e' medial:  14.5 LVOT diam:     2.20 cm   LV e' lateral:   14.25 cm/s LV SV:         63        LV E/e' lateral: 10.2 LV SV Index:   28 LVOT Area:     3.80 cm                           3D Volume EF:                          3D EF:        57 %                          LV EDV:       116 ml                          LV ESV:       50 ml                          LV SV:        66 ml RIGHT VENTRICLE RV S prime:     12.50 cm/s TAPSE (M-mode): 1.6 cm LEFT ATRIUM             Index        RIGHT ATRIUM           Index LA diam:        4.40 cm 1.95 cm/m   RA Area:     24.50 cm LA Vol (A2C):   79.8 ml 35.44 ml/m  RA Volume:   65.60 ml  29.13 ml/m LA Vol (A4C):   92.6 ml 41.12 ml/m LA Biplane Vol: 90.5 ml 40.19 ml/m  AORTIC VALVE LVOT Vmax:   81.70 cm/s LVOT Vmean:  56.500 cm/s LVOT VTI:    0.165 m  AORTA Ao Root diam: 3.60 cm Ao Asc diam:  3.00 cm MITRAL VALVE                TRICUSPID VALVE MV Area (PHT):  4.64 cm    TR Peak grad:   37.2 mmHg MV Area (plan): 7.34 cm    TR Vmax:        305.00 cm/s MV Decel Time:  163 msec MV E velocity: 144.67 cm/s  SHUNTS                             Systemic VTI:  0.16 m                             Systemic Diam: 2.20 cm Phineas Inches Electronically signed by Phineas Inches Signature Date/Time: 06/13/2021/5:14:36 PM    Final         Scheduled Meds:  allopurinol  300 mg Oral Daily   azelastine  2 spray Each Nare Daily   gabapentin  300 mg Oral QHS   levothyroxine  75 mcg Oral Q0600   loratadine  10 mg Oral QHS   metoprolol succinate  12.5 mg  Oral Daily   mometasone-formoterol  2 puff Inhalation BID   predniSONE  40 mg Oral Q breakfast   rivaroxaban  20 mg Oral Q supper   rosuvastatin  20 mg Oral Once per day on Mon Thu   umeclidinium bromide  1 puff Inhalation Daily   Continuous Infusions:  levofloxacin (LEVAQUIN) IV 750  mg (06/13/21 2128)     LOS: 1 day    Time spent: over 30 min    Fayrene Helper, MD [facility]   To contact the attending provider between 7A-7P or the covering provider during after hours 7P-7A, please log into the web site [email or web address] and access using universal [place] password for that web site. If you do not have the password, please call the hospital operator.  06/14/2021, 1:14 PM

## 2021-06-14 NOTE — Progress Notes (Signed)
Nutrition Brief Note  Consult received for assessment of nutrition requirements and status.   Wt Readings from Last 15 Encounters:  06/13/21 118.4 kg  05/08/21 127.9 kg  04/24/21 127 kg  03/23/21 127 kg  01/17/21 131.1 kg  01/10/21 133.4 kg  10/17/20 133.4 kg  10/10/20 133.4 kg  09/05/20 133.4 kg  06/27/20 133.6 kg  01/13/20 (!) 140.3 kg  12/28/19 (!) 141.8 kg  04/27/19 133.8 kg  03/30/19 132.5 kg  03/09/19 132.5 kg    Body mass index is 41.5 kg/m. Patient meets criteria for morbid obesity based on current BMI. Skin WDL.  Weight yesterday was 261 lb and weight was 281 lb on 05/08/21. This indicates 20 lb weight loss (7% body weight) in the past 1 month.  Current diet order is Heart Healthy. She ate 75% of dinner last night and 100% of breakfast this AM.  Lunch tray delivered during RD visit; helped set patient and tray up for the meal.   Patient shares that her husband of 65 years unexpectedly died on 05/24/21. Family and friends are planning to gather for a celebration of life on 06/21/21. Appetite and intakes have been decreased over the past 1 month as she continues to grieve and process her husband's passing.   Labs and medications reviewed.   Will order 1 tablet multivitamin with minerals/day. No other nutrition interventions warranted at this time. If nutrition issues arise, please consult RD.      Jarome Matin, MS, RD, LDN Inpatient Clinical Dietitian RD pager # available in [place]  After hours/weekend pager # available in [facility]

## 2021-06-15 ENCOUNTER — Ambulatory Visit: Payer: Medicare Other | Admitting: Family Medicine

## 2021-06-15 ENCOUNTER — Inpatient Hospital Stay (HOSPITAL_COMMUNITY): Payer: Medicare Other

## 2021-06-15 DIAGNOSIS — J189 Pneumonia, unspecified organism: Secondary | ICD-10-CM

## 2021-06-15 LAB — EXPECTORATED SPUTUM ASSESSMENT W GRAM STAIN, RFLX TO RESP C

## 2021-06-15 LAB — GLUCOSE, CAPILLARY
Glucose-Capillary: 98 mg/dL (ref 70–99)
Glucose-Capillary: 124 mg/dL — ABNORMAL HIGH (ref 70–99)
Glucose-Capillary: 147 mg/dL — ABNORMAL HIGH (ref 70–99)
Glucose-Capillary: 139 mg/dL — ABNORMAL HIGH (ref 70–99)

## 2021-06-15 LAB — MRSA NEXT GEN BY PCR, NASAL: MRSA by PCR Next Gen: NOT DETECTED

## 2021-06-15 LAB — STREP PNEUMONIAE URINARY ANTIGEN: Strep Pneumo Urinary Antigen: NEGATIVE

## 2021-06-15 MED ORDER — SODIUM CHLORIDE (PF) 0.9 % IJ SOLN
INTRAMUSCULAR | Status: AC
  Filled 2021-06-15: qty 50

## 2021-06-15 MED ORDER — LEVOFLOXACIN 750 MG PO TABS
750.0000 mg | ORAL_TABLET | ORAL | Status: DC
  Administered 2021-06-15: 750 mg via ORAL
  Filled 2021-06-15: qty 1

## 2021-06-15 MED ORDER — IOHEXOL 350 MG/ML SOLN
75.0000 mL | Freq: Once | INTRAVENOUS | Status: AC | PRN
  Administered 2021-06-15: 75 mL via INTRAVENOUS

## 2021-06-15 NOTE — Progress Notes (Signed)
PHARMACIST - PHYSICIAN COMMUNICATION DR:   Florene Glen CONCERNING: Antibiotic IV to Oral Route Change Policy  RECOMMENDATION: This patient is receiving Levaquin by the intravenous route.  Based on criteria approved by the Pharmacy and Therapeutics Committee, the antibiotic(s) is/are being converted to the equivalent oral dose form(s).   DESCRIPTION: These criteria include: Patient being treated for a respiratory tract infection, urinary tract infection, cellulitis or clostridium difficile associated diarrhea if on metronidazole The patient is not neutropenic and does not exhibit a GI malabsorption state The patient is eating (either orally or via tube) and/or has been taking other orally administered medications for a least 24 hours The patient is improving clinically and has a Tmax < 100.5  If you have questions about this conversion, please contact the Pharmacy Department  []   [phone] )  [facility] []   [phone] )  [facility] []   [phone] )  [facility] []   [phone] )  [facility] [x]   [phone] )  [place], PharmD, BCPS 06/15/2021 8:19 AM

## 2021-06-15 NOTE — TOC Initial Note (Addendum)
Transition of Care [facility]) - Initial/Assessment Note    Patient Details  Name: [name] MRN: [id number] Date of Birth: 11-28-1935  Transition of Care [facility]) CM/SW Contact:    Dessa Phi, RN Phone Number: 06/15/2021, 9:47 AM  Clinical Narrative:  PT recc HHPT-spoke to dtr [name]-no preference for [facility] agency-will check who can accept. On 02-[facility] following if needed.   Alvis Lemmings HHPT;home 02 ordered-[facility] will deliver home 02 travel tank to rm prior d/c. No further CM needs.               Expected Discharge Plan: [place] Barriers to Discharge: Continued Medical Work up   Patient Goals and CMS Choice Patient states their goals for this hospitalization and ongoing recovery are:: go home CMS Medicare.gov Compare Post Acute Care list provided to:: Patient Represenative (must comment) (julie dtr) Choice offered to / list presented to : Adult Children  Expected Discharge Plan and Services Expected Discharge Plan: [place]   Discharge Planning Services: CM Consult Post Acute Care Choice: [place] arrangements for the past 2 months: Single Family Home                                      Prior Living Arrangements/Services Living arrangements for the past 2 months: Single Family Home Lives with:: Self Patient language and need for interpreter reviewed:: Yes Do you feel safe going back to the place where you live?: Yes      Need for Family Participation in Patient Care: No (Comment) Care giver support system in place?: Yes (comment) Current home services: DME (CPAP) Criminal Activity/Legal Involvement Pertinent to Current Situation/Hospitalization: No - Comment as needed  Activities of Daily Living Home Assistive Devices/Equipment: Walker (specify type) ADL Screening (condition at time of admission) Patient's cognitive ability adequate to safely complete daily activities?: Yes Is the patient deaf or have  difficulty hearing?: Yes Does the patient have difficulty seeing, even when wearing glasses/contacts?: No Does the patient have difficulty concentrating, remembering, or making decisions?: No Patient able to express need for assistance with ADLs?: Yes Does the patient have difficulty dressing or bathing?: No Independently performs ADLs?: Yes (appropriate for developmental age) Does the patient have difficulty walking or climbing stairs?: Yes Weakness of Legs: Both Weakness of Arms/Hands: None  Permission Sought/Granted Permission sought to share information with : Case Manager Permission granted to share information with : Yes, Verbal Permission Granted  Share Information with NAME: Case manager           Emotional Assessment              Admission diagnosis:  COPD exacerbation ([place]) [J44.1] NSTEMI (non-ST elevated myocardial infarction) ([place]) [I21.4] Acute respiratory failure with hypoxia ([place]) [J96.01] Upper respiratory tract infection, unspecified type [J06.9] Patient Active Problem List   Diagnosis Date Noted   COPD exacerbation ([employer]) 06/12/2021   Elevated troponin 06/12/2021   Acute respiratory failure with hypoxia ([place]) 06/12/2021   Febrile illness 05/22/2021   Anxiety state 10/23/2020   Pure hypercholesterolemia 10/23/2020   Urinary tract infectious disease 10/23/2020   Hypertension 10/23/2020   Atherosclerosis of coronary artery 10/23/2020   Sinus node dysfunction ([place]) 10/23/2020   Exacerbation of asthma 10/23/2020   Mixed hyperlipidemia 10/23/2020   Obstructive sleep apnea syndrome 10/23/2020   Fall 09/05/2020   Nausea 04/23/2020   Constipation 04/23/2020   Posterior uveitis, right  eye 04/06/2020   Dysphagia 01/21/2020   CHF (congestive heart failure) ([place]) 11/23/2019   Asthma exacerbation 12/04/2018   Exposure to COVID-19 virus 12/04/2018   Osteoarthritis 09/19/2018   Abscess of left leg 09/26/2017   Left leg cellulitis 09/18/2017   Whiplash  06/10/2017   Cough 05/29/2017   Concussion 05/29/2017   Hammer toes of both feet 12/19/2016   Allergic rhinitis 12/09/2016   Pedal edema 09/17/2016   Chest pain 07/18/2016   Gross hematuria 04/26/2016   Hyperglycemia 03/21/2016   Dysuria 12/07/2015   Fever blister 12/07/2015   Choroidal nevus, 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   Malignant neoplasm of left female breast ([place]) 10/10/2015   Anxiety and depression 06/30/2015   Encounter for well adult exam with abnormal findings 06/30/2015   Neck mass 03/24/2015   Hypothyroidism 03/24/2015   Gout 03/24/2015   Essential hypertension 03/24/2015   Asthma 03/24/2015   Peripheral neuropathy 03/24/2015   Osteoporosis 03/24/2015   OSA (obstructive sleep apnea) 03/24/2015   Morbid obesity ([place]) 03/24/2015   Long term current use of anticoagulant therapy 03/16/2015   Lymphadenopathy, cervical 03/16/2015   Diarrhea 03/16/2015   Hyperbilirubinemia 03/16/2015   Coronary arteriosclerosis 09/22/2014   Hyperlipidemia 07/23/2013   Hypokalemia 02/18/2013   Elevated bilirubin 02/18/2013   Morbid obesity with BMI of 45.0-49.9, adult ([place]) 10/29/2012   Rotator cuff tear arthropathy 10/29/2012   DJD (degenerative joint disease) of knee 10/29/2012   Plantar fasciitis    Arthritis    Hx of radiation therapy    Atrial fibrillation ([place]) 01/21/2011   Malignant neoplasm of upper-outer quadrant of left breast in female, estrogen receptor positive ([place]) 12/26/2010   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

## 2021-06-15 NOTE — Progress Notes (Signed)
SATURATION QUALIFICATIONS: (This note is used to comply with regulatory documentation for home oxygen)  Patient Saturations on Room Air at Rest = 90%  Patient Saturations on Room Air while Ambulating = 82%  Patient Saturations on 3 Liters of oxygen while Ambulating = 93%  Please briefly explain why patient needs home oxygen: Pt unable to stay within normal limits for oxygen saturation while ambulating or completing exerting activities.

## 2021-06-15 NOTE — Progress Notes (Signed)
Physical Therapy Treatment Patient Details Name: [name] MRN: [id number] DOB: 07-14-1935 Today's Date: 06/15/2021   History of Present Illness 86 y.o. female with medical history significant of Asthma, COPD, HTN, a.fib on Xarelto. Pt presents to ED with c/o cough productive of yellow sputum, SOB, wheezing.  Had fevers, chills, rigors. Dx of respiratory failure, COPD exacerbation.    PT Comments    Pt in bathroom on arrival.  Pt agreeable to ambulate in hallway again (had already been up with nursing for saturation screening this morning).  Pt remained on 3L O2 [place] and reports moderate SOB with activity this afternoon however SpO2 92%.  Pt hopeful for d/c home later today.   Recommendations for follow up therapy are one component of a multi-disciplinary discharge planning process, led by the attending physician.  Recommendations may be updated based on patient status, additional functional criteria and insurance authorization.  Follow Up Recommendations  Home health PT     Assistance Recommended at Discharge    Patient can return home with the following Assist for transportation;A little help with bathing/dressing/bathroom   Equipment Recommendations  None recommended by PT    Recommendations for Other Services       Precautions / Restrictions Precautions Precautions: Fall Precaution Comments: monitor O2 sats     Mobility  Bed Mobility Overal bed mobility: Modified Independent                  Transfers Overall transfer level: Needs assistance Equipment used: Rolling walker (2 wheels) Transfers: Sit to/from Stand Sit to Stand: Supervision           General transfer comment: supervision for safety    Ambulation/Gait Ambulation/Gait assistance: Supervision Gait Distance (Feet): 160 Feet Assistive device: Rolling walker (2 wheels) Gait Pattern/deviations: Step-through pattern, Decreased stride length Gait velocity: decr     General Gait Details:  mobilizing well with RW, ambulated on 3L O2 [place] and SPO2 92%, one standing rest break required   Stairs             Wheelchair Mobility    Modified Rankin (Stroke Patients Only)       Balance                                            Cognition Arousal/Alertness: Awake/alert Behavior During Therapy: WFL for tasks assessed/performed Overall Cognitive Status: Within Functional Limits for tasks assessed                                          Exercises      General Comments        Pertinent Vitals/Pain Pain Assessment Pain Assessment: No/denies pain    Home Living                          Prior Function            PT Goals (current goals can now be found in the care plan section) Progress towards PT goals: Progressing toward goals    Frequency    Min 3X/week      PT Plan Current plan remains appropriate    Co-evaluation              AM-PAC PT "6  Clicks" Mobility   Outcome Measure  Help needed turning from your back to your side while in a flat bed without using bedrails?: None Help needed moving from lying on your back to sitting on the side of a flat bed without using bedrails?: None Help needed moving to and from a bed to a chair (including a wheelchair)?: A Little Help needed standing up from a chair using your arms (e.g., wheelchair or bedside chair)?: A Little Help needed to walk in hospital room?: A Little Help needed climbing 3-5 steps with a railing? : A Little 6 Click Score: 20    End of Session Equipment Utilized During Treatment: Oxygen Activity Tolerance: Patient tolerated treatment well Patient left: with call bell/phone within reach;in bed Nurse Communication: Mobility status PT Visit Diagnosis: Difficulty in walking, not elsewhere classified (R26.2)     Time: 8184-0375 PT Time Calculation (min) (ACUTE ONLY): 16 min  Charges:  $Gait Training: 8-22 mins                      Arlyce Dice, DPT Acute Rehabilitation Services Pager: [phone] Office: [place] 06/15/2021, 3:17 PM

## 2021-06-15 NOTE — Assessment & Plan Note (Addendum)
Levaquin, complete course tonight Negative RVP, covid, influenza Follow urine strep (negative), urine legionella (pending), MRSA PCR (negative), sputum cx (samples not acceptable)

## 2021-06-15 NOTE — Progress Notes (Signed)
PROGRESS NOTE    [name]  HRC:[id number] DOB: Oct 07, 1935 DOA: 06/12/2021 PCP: Biagio Borg, MD  Chief Complaint  Patient presents with   Cough   low oxygen    Brief Narrative:  86 yo with hx asthma, COPD, HTN, atrial fibrillation on xarelto who presented with SOB, wheezing, fevers, chills who's been admitted with acute hypoxic respiratory failure, being treated for COPD exacerbation.   Assessment & Plan:   Principal Problem:   Acute respiratory failure with hypoxia (HCC) Active Problems:   COPD exacerbation (HCC)   CAP (community acquired pneumonia)   Febrile illness   Elevated troponin   Atrial fibrillation (HCC)   Essential hypertension   Mixed hyperlipidemia   Hypothyroidism   Hyperglycemia   Assessment and Plan: * Acute respiratory failure with hypoxia ([place])- (present on admission) 2/2 pneumonia and COPD exacerbation CT today with persistent hypoxia showing multifocal pneumonia CXR with bibasilar subsegmental atelectasis Continue antibiotics, steroids, and nebs Wean o2 as tolerated - hopefully will be able to wean o2, but may need to discharge with oxygen   CAP (community acquired pneumonia) Levaquin, complete course Negative RVP, covid, influenza Follow urine strep, urine legionella, MRSA PCR, sputum cx  COPD exacerbation (HCC)- (present on admission) Wheezing improved, today Complete course of steroids incruse ellipta, dulera (she's on wixela at home it looks like) Prn nebs  levaquin Cont pulse ox Wean O2 as tolerated  Elevated troponin- (present on admission) Troponin downtrending, relatively low - suspect demand EKG with atrial fibrillation, PVC's, IVCD Denies CP - suspect this is demand ischemia, not c/w ACS Follow echo -> echo with EF 60-65%, no RWMA, mild concentric LVH, moderately elevated PASP  Febrile illness- (present on admission) Negative covid, influenza With COPD exacerbation, follow RVP (negative) Due to pneumonia as above UA  pending  Atrial fibrillation (HCC)- (present on admission) Chronic xarelto Cont home rate ctrl meds  Mixed hyperlipidemia- (present on admission) Cont crestor 2x weekly  Essential hypertension- (present on admission) Cont home metoprolol meds Hold HCTZ for now  Hypothyroidism- (present on admission) synthroid  Hyperglycemia- (present on admission) Follow A1c (5.8), POC BG, consider SSI - likely steroid induced  DVT prophylaxis: heparin Code Status: full Family Communication: none Disposition:   Status is: Observation The patient will require care spanning > 2 midnights and should be moved to inpatient because: need for IV abx, steroids, nebs         Consultants:  none  Procedures:  Echo IMPRESSIONS     1. Left ventricular ejection fraction, by estimation, is 60 to 65%. The  left ventricle has normal function. The left ventricle has no regional  wall motion abnormalities. There is mild concentric left ventricular  hypertrophy. Left ventricular diastolic  parameters are indeterminate.   2. Right ventricular systolic function is normal. The right ventricular  size is normal. There is moderately elevated pulmonary artery systolic  pressure.   3. Left atrial size was moderately dilated.   4. Right atrial size was mildly dilated.   5. No evidence of mitral valve regurgitation.   6. There is mild calcification of the aortic valve. Aortic valve  regurgitation is not visualized.   7. The inferior vena cava is dilated in size with <50% respiratory  variability, suggesting right atrial pressure of 15 mmHg.   Conclusion(s)/Recommendation(s): Compared to prior echo RA pressure  increased.   Antimicrobials:  Anti-infectives (From admission, onward)    Start     Dose/Rate Route Frequency Ordered Stop   06/15/21 2100  levofloxacin (LEVAQUIN) tablet 750 mg        750 mg Oral Every 24 hours 06/15/21 0820 06/17/21 2059   06/13/21 2100  levofloxacin (LEVAQUIN) IVPB 750  mg  Status:  Discontinued        750 mg 100 mL/hr over 90 Minutes Intravenous Every 24 hours 06/12/21 2048 06/15/21 0820   06/12/21 1945  levofloxacin (LEVAQUIN) IVPB 750 mg        750 mg 100 mL/hr over 90 Minutes Intravenous  Once 06/12/21 1937 06/12/21 2148       Subjective: No new complaints Overall feeling slightly better  Objective: Vitals:   06/15/21 0844 06/15/21 0920 06/15/21 0924 06/15/21 1400  BP:  139/79  (!) 165/100  Pulse:    83  Resp:      Temp:    97.7 F (36.5 C)  TempSrc:    Oral  SpO2: 93% 90% 94% 95%  Weight:      Height:        Intake/Output Summary (Last 24 hours) at 06/15/2021 1706 Last data filed at 06/15/2021 0950 Gross per 24 hour  Intake 510 ml  Output --  Net 510 ml   Filed Weights   06/13/21 0942  Weight: 118.4 kg    Examination:  General: No acute distress. Cardiovascular: Heart sounds show Meera Vasco regular rate, and rhythm.  Lungs: occasional scattered wheeze, diminished - continued o2 requirement Abdomen: Soft, nontender, nondistended Neurological: Alert and oriented 3. Moves all extremities 4. Cranial nerves II through XII grossly intact. Skin: Warm and dry. No rashes or lesions. Extremities: No clubbing or cyanosis. No edema.   Data Reviewed: I have personally reviewed following labs and imaging studies  CBC: Recent Labs  Lab 06/12/21 1826 06/13/21 0243 06/14/21 0425  WBC 11.2* 11.5* 14.4*  NEUTROABS 8.7*  --   --   HGB 11.9* 11.9* 11.5*  HCT 36.8 36.9 36.0  MCV 99.7 100.5* 100.6*  PLT 159 145* 229    Basic Metabolic Panel: Recent Labs  Lab 06/12/21 1826 06/13/21 0243 06/14/21 0425  NA 134* 134* 137  K 3.3* 5.0 3.7  CL 100 101 105  CO2 25 23 26   GLUCOSE 104* 182* 132*  BUN 21 19 24*  CREATININE 0.89 0.84 0.83  CALCIUM 8.9 8.7* 9.0  MG  --   --  2.3  PHOS  --   --  3.0    GFR: Estimated Creatinine Clearance: 67.9 mL/min (by C-G formula based on SCr of 0.83 mg/dL).  Liver Function Tests: Recent Labs  Lab  06/12/21 1826 06/14/21 0425  AST 29 21  ALT 15 14  ALKPHOS 46 46  BILITOT 2.6* 1.2  PROT 6.7 6.7  ALBUMIN 3.5 3.4*    CBG: Recent Labs  Lab 06/14/21 1626 06/14/21 2125 06/15/21 0737 06/15/21 1123 06/15/21 1657  GLUCAP 192* 143* 98 124* 147*     Recent Results (from the past 240 hour(s))  Resp Panel by RT-PCR (Flu Marcey Persad&B, Covid) Nasopharyngeal Swab     Status: None   Collection Time: 06/12/21  6:25 PM   Specimen: Nasopharyngeal Swab; Nasopharyngeal(NP) swabs in vial transport medium  Result Value Ref Range Status   SARS Coronavirus 2 by RT PCR NEGATIVE NEGATIVE Final    Comment: (NOTE) SARS-CoV-2 target nucleic acids are NOT DETECTED.  The SARS-CoV-2 RNA is generally detectable in upper respiratory specimens during the acute phase of infection. The lowest concentration of SARS-CoV-2 viral copies this assay can detect is 138 copies/mL. Kejuan Bekker negative result does not  preclude SARS-Cov-2 infection and should not be used as the sole basis for treatment or other patient management decisions. Arik Husmann negative result may occur with  improper specimen collection/handling, submission of specimen other than nasopharyngeal swab, presence of viral mutation(s) within the areas targeted by this assay, and inadequate number of viral copies(<138 copies/mL). Jania Steinke negative result must be combined with clinical observations, patient history, and epidemiological information. The expected result is Negative.  Fact Sheet for Patients:  [name]  Fact Sheet for Healthcare Providers:  [name]  This test is no t yet approved or cleared by the [name] FDA and  has been authorized for detection and/or diagnosis of SARS-CoV-2 by FDA under an Emergency Use Authorization (EUA). This EUA will remain  in effect (meaning this test can be used) for the duration of the COVID-19 declaration under Section 564(b)(1) of the Act,  21 U.S.C.section 360bbb-3(b)(1), unless the authorization is terminated  or revoked sooner.       Influenza Haly Feher by PCR NEGATIVE NEGATIVE Final   Influenza B by PCR NEGATIVE NEGATIVE Final    Comment: (NOTE) The Xpert Xpress SARS-CoV-2/FLU/RSV plus assay is intended as an aid in the diagnosis of influenza from Nasopharyngeal swab specimens and should not be used as Daliyah Sramek sole basis for treatment. Nasal washings and aspirates are unacceptable for Xpert Xpress SARS-CoV-2/FLU/RSV testing.  Fact Sheet for Patients: [name]  Fact Sheet for Healthcare Providers: [name]  This test is not yet approved or cleared by the [name] FDA and has been authorized for detection and/or diagnosis of SARS-CoV-2 by FDA under an Emergency Use Authorization (EUA). This EUA will remain in effect (meaning this test can be used) for the duration of the COVID-19 declaration under Section 564(b)(1) of the Act, 21 U.S.C. section 360bbb-3(b)(1), unless the authorization is terminated or revoked.  Performed at [facility], [place] [address], [place], [place] [address]   Respiratory (~20 pathogens) panel by PCR     Status: None   Collection Time: 06/13/21  9:41 AM   Specimen: Nasopharyngeal Swab; Respiratory  Result Value Ref Range Status   Adenovirus NOT DETECTED NOT DETECTED Final   Coronavirus 229E NOT DETECTED NOT DETECTED Final    Comment: (NOTE) The Coronavirus on the Respiratory Panel, DOES NOT test for the novel  Coronavirus (2019 nCoV)    Coronavirus HKU1 NOT DETECTED NOT DETECTED Final   Coronavirus NL63 NOT DETECTED NOT DETECTED Final   Coronavirus OC43 NOT DETECTED NOT DETECTED Final   Metapneumovirus NOT DETECTED NOT DETECTED Final   Rhinovirus / Enterovirus NOT DETECTED NOT DETECTED Final   Influenza Roth Ress NOT DETECTED NOT DETECTED Final   Influenza B NOT DETECTED NOT DETECTED Final   Parainfluenza Virus 1  NOT DETECTED NOT DETECTED Final   Parainfluenza Virus 2 NOT DETECTED NOT DETECTED Final   Parainfluenza Virus 3 NOT DETECTED NOT DETECTED Final   Parainfluenza Virus 4 NOT DETECTED NOT DETECTED Final   Respiratory Syncytial Virus NOT DETECTED NOT DETECTED Final   Bordetella pertussis NOT DETECTED NOT DETECTED Final   Bordetella Parapertussis NOT DETECTED NOT DETECTED Final   Chlamydophila pneumoniae NOT DETECTED NOT DETECTED Final   Mycoplasma pneumoniae NOT DETECTED NOT DETECTED Final    Comment: Performed at [facility], [place]. [address] [place], [place] [address]  MRSA Next Gen by PCR, Nasal     Status: None   Collection Time: 06/15/21  2:20 PM   Specimen: Nasal Mucosa; Nasal Swab  Result Value Ref Range Status  MRSA by PCR Next Gen NOT DETECTED NOT DETECTED Final    Comment: (NOTE) The GeneXpert MRSA Assay (FDA approved for NASAL specimens only), is one component of Amal Saiki comprehensive MRSA colonization surveillance program. It is not intended to diagnose MRSA infection nor to guide or monitor treatment for MRSA infections. Test performance is not FDA approved in patients less than 14 years old. Performed at [facility], [place] [address] [place], [place] [address]          Radiology Studies: CT Angio Chest Pulmonary Embolism (PE) W or WO Contrast  Result Date: 06/15/2021 CLINICAL DATA:  Chest pain, shortness of breath, fever, hypoxia EXAM: CT ANGIOGRAPHY CHEST WITH CONTRAST TECHNIQUE: Multidetector CT imaging of the chest was performed using the standard protocol during bolus administration of intravenous contrast. Multiplanar CT image reconstructions and MIPs were obtained to evaluate the vascular anatomy. RADIATION DOSE REDUCTION: This exam was performed according to the departmental dose-optimization program which includes automated exposure control, adjustment of the mA and/or kV according to patient size and/or use of iterative reconstruction technique.  CONTRAST:  38mL OMNIPAQUE IOHEXOL 350 MG/ML SOLN COMPARISON:  CT 03/27/2015.  X-ray 06/12/2021 FINDINGS: Cardiovascular: Satisfactory opacification of the pulmonary arteries. Respiratory motion artifact degrades evaluation of the more distal pulmonary arterial branches, particularly within the lung bases. No filling defect to the segmental branch level to suggest pulmonary embolism. Main pulmonary trunk measures 3.5 cm in diameter. Thoracic aorta is nonaneurysmal. Atherosclerotic calcifications of the aorta and coronary arteries. Heart size is mildly enlarged. No pericardial effusion. Mediastinum/Nodes: No pathologically enlarged axillary, mediastinal, or hilar lymph nodes. Trachea and esophagus within normal limits. Small sliding-type hiatal hernia. Lungs/Pleura: Multifocal areas of airspace consolidation throughout both lungs, much of which is ground-glass. Findings are most confluent within the left upper lobe anteriorly. No pleural effusion or pneumothorax. Upper Abdomen: Grossly stable appearance of Jora Galluzzo left renal cyst. 10 mm left renal stone. Musculoskeletal: Severe osteoarthritis of the right glenohumeral joint with multiple intra-articular loose bodies. Osseous structures appear demineralized. No acute bony findings. Review of the MIP images confirms the above findings. IMPRESSION: 1. Exam is slightly degraded by respiratory motion artifact. No pulmonary embolism is identified within the limitations of this exam. 2. Multifocal areas of airspace consolidation throughout both lungs, most confluent within the left upper lobe anteriorly, suggestive of multifocal pneumonia. 3. Left-sided nephrolithiasis. 4. Small hiatal hernia. 5. Aortic and coronary artery atherosclerosis (ICD10-I70.0). Electronically Signed   By: Davina Poke D.O.   On: 06/15/2021 13:57        Scheduled Meds:  allopurinol  300 mg Oral Daily   azelastine  2 spray Each Nare Daily   gabapentin  300 mg Oral QHS   levofloxacin  750 mg  Oral Q24H   levothyroxine  75 mcg Oral Q0600   loratadine  10 mg Oral QHS   metoprolol succinate  12.5 mg Oral Daily   mometasone-formoterol  2 puff Inhalation BID   multivitamin with minerals  1 tablet Oral Daily   predniSONE  40 mg Oral Q breakfast   rivaroxaban  20 mg Oral Q supper   rosuvastatin  20 mg Oral Once per day on Mon Thu   sodium chloride (PF)       umeclidinium bromide  1 puff Inhalation Daily   Continuous Infusions:     LOS: 2 days    Time spent: over 30 min    Fayrene Helper, MD [facility]   To contact the attending provider between 7A-7P  or the covering provider during after hours 7P-7A, please log into the web site [email or web address] and access using universal [place] password for that web site. If you do not have the password, please call the hospital operator.  06/15/2021, 5:06 PM

## 2021-06-15 NOTE — Progress Notes (Deleted)
Weaned pt to RA at rest on bed with O2 sats up to 95%. Walked with pt in hallway with RW on RA and pt O2 sats decreased to 82-85%. Continued to walk with pt in hall but added 2L O2 and sats were 88-94%. Walked with pt in hall with 3L and O2 sats were 92-95%. Returned pt to room and she sat on edge of bed with 2L of O2 to recover. Removed O2, but noted with pt eating breakfast while sitting on edge of bed that O2 sats were 90% on RA. Placed pt on 2L oxygen. MD informed of oxygen challenge and return of pt to O2.

## 2021-06-16 LAB — CBC
HCT: 38.6 % (ref 36.0–46.0)
Hemoglobin: 12.4 g/dL (ref 12.0–15.0)
MCH: 32.2 pg (ref 26.0–34.0)
MCHC: 32.1 g/dL (ref 30.0–36.0)
MCV: 100.3 fL — ABNORMAL HIGH (ref 80.0–100.0)
Platelets: 204 10*3/uL (ref 150–400)
RBC: 3.85 MIL/uL — ABNORMAL LOW (ref 3.87–5.11)
RDW: 15 % (ref 11.5–15.5)
WBC: 8.9 10*3/uL (ref 4.0–10.5)
nRBC: 0 % (ref 0.0–0.2)

## 2021-06-16 LAB — EXPECTORATED SPUTUM ASSESSMENT W GRAM STAIN, RFLX TO RESP C

## 2021-06-16 LAB — BASIC METABOLIC PANEL
Anion gap: 6 (ref 5–15)
BUN: 20 mg/dL (ref 8–23)
CO2: 30 mmol/L (ref 22–32)
Calcium: 9.4 mg/dL (ref 8.9–10.3)
Chloride: 105 mmol/L (ref 98–111)
Creatinine, Ser: 0.69 mg/dL (ref 0.44–1.00)
GFR, Estimated: >60 mL/min (ref >60)
Glucose, Bld: 94 mg/dL (ref 70–99)
Potassium: 3.6 mmol/L (ref 3.5–5.1)
Sodium: 141 mmol/L (ref 135–145)

## 2021-06-16 LAB — GLUCOSE, CAPILLARY
Glucose-Capillary: 107 mg/dL — ABNORMAL HIGH (ref 70–99)
Glucose-Capillary: 130 mg/dL — ABNORMAL HIGH (ref 70–99)

## 2021-06-16 MED ORDER — LEVOFLOXACIN 750 MG PO TABS: 750.0000 mg | ORAL_TABLET | ORAL | 0 refills | Status: AC

## 2021-06-16 MED ORDER — LEVOFLOXACIN 750 MG PO TABS: 750.0000 mg | ORAL_TABLET | ORAL | 0 refills | Status: DC

## 2021-06-16 NOTE — Progress Notes (Signed)
AVS and discharge instructions reviewed w/ pt. And daughter at the bedside. Pt. Also being sent with at home oxygen, supplies delivered to the bedside earlier this morning. Patient verbalized understanding of everything and had no further questions.

## 2021-06-16 NOTE — Progress Notes (Signed)
Patient stated that she was unable to cough any sputum up. She said she is dry, no secretions. Unable to get sputum at this time

## 2021-06-16 NOTE — Discharge Summary (Signed)
Physician Discharge Summary  [name] YWV:[id number] DOB: 12/26/1935 DOA: 06/12/2021  PCP: Mercedes Borg, MD  Admit date: 06/12/2021 Discharge date: 06/16/2021  Time spent: 40 minutes  Recommendations for Outpatient Follow-up:  Follow outpatient CBC/CMP Follow O2 needs outpatient, wean as tolerated Follow moderately elevated PASP with cardiology outpatient Follow elevated trop here with cards outpatient - suspect demand Reestablish with pulmonary outpatient    Discharge Diagnoses:  Principal Problem:   Acute respiratory failure with hypoxia ([name]) Active Problems:   COPD exacerbation ([name])   CAP (community acquired pneumonia)   Febrile illness   Elevated troponin   Atrial fibrillation ([name])   Essential hypertension   Mixed hyperlipidemia   Hypothyroidism   Prediabetes   Discharge Condition: stable  Diet recommendation: heart healthy  Filed Weights   06/13/21 0942  Weight: 118.4 kg    History of present illness:  86 yo with hx asthma, COPD, HTN, atrial fibrillation on xarelto who presented with SOB, wheezing, fevers, chills who's been admitted with acute hypoxic respiratory failure, being treated for COPD exacerbation.  She eventually had CT scan which showed multifocal pneumonia.  She's gradually improved with steroids and antibiotics.  Discharged with o2 with activity.  See below for additional details  Hospital Course:  Assessment and Plan: * Acute respiratory failure with hypoxia ([name])- (present on admission) 2/2 pneumonia and COPD exacerbation CT today with persistent hypoxia showing multifocal pneumonia CXR with bibasilar subsegmental atelectasis Steroids complete.  Abx to finish tonight. Discharging with o2 with activity, follow outpatient to wean   CAP (community acquired pneumonia) Levaquin, complete course tonight Negative RVP, covid, influenza Follow urine strep (negative), urine legionella (pending), MRSA PCR (negative), sputum cx (samples not  acceptable)  COPD exacerbation ([name])- (present on admission) Wheezing improved, today S/p 5 days steroids Last dose of levaquin tonight Resume home inhalers (wixela, levalbuterol) Discharge with home o2, suspect this will be able to wean off outpatient - desatted to 88-89 on RA today (to 82 yesterday).  Elevated troponin- (present on admission) Troponin downtrending, relatively low - suspect demand EKG with atrial fibrillation, PVC's, IVCD Denies CP - suspect this is demand ischemia, not c/w ACS Follow echo -> echo with EF 60-65%, no RWMA, mild concentric LVH, moderately elevated PASP  Febrile illness- (present on admission) Negative covid, influenza With COPD exacerbation, follow RVP (negative) Due to pneumonia as above  Atrial fibrillation ([name])- (present on admission) Chronic xarelto Cont home rate ctrl meds  Mixed hyperlipidemia- (present on admission) Cont crestor 2x weekly  Essential hypertension- (present on admission) HCTZ, metop  Hypothyroidism- (present on admission) synthroid  Prediabetes- (present on admission) Follow A1c (5.8) Steroid induced hyperglycemia   Procedures: Echo IMPRESSIONS     1. Left ventricular ejection fraction, by estimation, is 60 to 65%. The  left ventricle has normal function. The left ventricle has no regional  wall motion abnormalities. There is mild concentric left ventricular  hypertrophy. Left ventricular diastolic  parameters are indeterminate.   2. Right ventricular systolic function is normal. The right ventricular  size is normal. There is moderately elevated pulmonary artery systolic  pressure.   3. Left atrial size was moderately dilated.   4. Right atrial size was mildly dilated.   5. No evidence of mitral valve regurgitation.   6. There is mild calcification of the aortic valve. Aortic valve  regurgitation is not visualized.   7. The inferior vena cava is dilated in size with <50% respiratory  variability,  suggesting right atrial pressure of 15  mmHg.   Conclusion(s)/Recommendation(s): Compared to prior echo RA pressure  increased.      Consultations: none  Discharge Exam: Vitals:   06/16/21 1042 06/16/21 1250  BP: 128/79 (!) 145/87  Pulse: 93 74  Resp: 20 (!) 22  Temp: 97.8 F (36.6 C) 98.8 F (37.1 C)  SpO2: 94% 95%   Eager to go home Feeling much better  General: No acute distress. Cardiovascular: Heart sounds show [name] regular rate, and rhythm. Lungs: Clear to auscultation bilaterally  Abdomen: Soft, nontender, nondistended  Neurological: Alert and oriented 3. Moves all extremities 4 . Cranial nerves II through XII grossly intact. Skin: Warm and dry. No rashes or lesions. Extremities: No clubbing or cyanosis. No edema.   Discharge Instructions   Discharge Instructions     Ambulatory referral to Pulmonology   Complete by: As directed    Patient was requesting [name] new pulmonary doc   Reason for referral: Asthma/COPD   Call MD for:  difficulty breathing, headache or visual disturbances   Complete by: As directed    Call MD for:  extreme fatigue   Complete by: As directed    Call MD for:  hives   Complete by: As directed    Call MD for:  persistant dizziness or light-headedness   Complete by: As directed    Call MD for:  persistant nausea and vomiting   Complete by: As directed    Call MD for:  redness, tenderness, or signs of infection (pain, swelling, redness, odor or green/yellow discharge around incision site)   Complete by: As directed    Call MD for:  severe uncontrolled pain   Complete by: As directed    Call MD for:  temperature >100.4   Complete by: As directed    Diet - low sodium heart healthy   Complete by: As directed    Discharge instructions   Complete by: As directed    You were seen for pneumonia and [name] COPD exacerbation.  You've improved with antibiotics and steroids.   We'll send you home with 1 more dose of levaquin.  You have [name] borderline  need for oxygen.  Use 2 L with activity (at rest your oxygen levels were good) and follow up with your PCP within 1-2 weeks.  They'll taper you off oxygen as able, hopefully you won't need it long term.   Follow up repeat chest imaging with your PCP as an outpatient.  I placed [name] new referral for [name] pulmonary doctor.   Return for new, recurrent, or worsening symptoms.  Please ask your PCP to request records from this hospitalization so they know what was done and what the next steps will be.   Increase activity slowly   Complete by: As directed       Allergies as of 06/16/2021       Reactions   Crestor [rosuvastatin Calcium]    Severe liver function problems   Hydrocodone-acetaminophen Anxiety, Itching   Lidocaine Hcl Other (See Comments)   Novacaine:  Becomes very shaky   Lipitor [atorvastatin Calcium] Other (See Comments)   Elevated liver function    Procaine Other (See Comments)   shaking   Rosuvastatin Other (See Comments)   Severe liver function problems   Theophylline Other (See Comments)   Becomes very shaky skaking skaking   Vicodin [hydrocodone-acetaminophen] Itching   Itching all over   Codeine Nausea Only, Anxiety, Other (See Comments)   Also complained of dizziness.  Has same problems with oxycodone and  hydrocodone Visual Disturbance, Balance Difficulty, Dizzy "Room Spinning; "Swimming" Balance, vision, nausea, dizzy, "swimming", "room spinning" Balance, vision, nausea, dizzy, "swimming", "room spinning"   Oxycodone-acetaminophen Nausea Only, Anxiety, Other (See Comments)   Visual Disturbance, Balance Difficulty, Dizzy "Room Spinning; "Swimming"   Propoxyphene Anxiety, Nausea Only, Other (See Comments)   Visual Disturbance, Balance Difficulty, Dizzy "Room Spinning; "Swimming" Balance, vision, nausea, dizzy, "swimming", "room spinning" Balance, vision, nausea, dizzy, "swimming, "room spinning"   Quinine Derivatives Nausea Only   dizzy   Sulfa Antibiotics Nausea  Only   Also complained of dizziness   Atorvastatin Other (See Comments)   Severe liver function problems   Ephedrine Other (See Comments)   shaking   Other Other (See Comments)   Quinine causes nausea, dizzy   Oxycodone Other (See Comments)   Balance, vision, nausea, dizzy, "swimming", "room spinning"   Quinine Other (See Comments)   Cephalexin Rash, Other (See Comments)   Epinephrine Other (See Comments)   Patient becomes "shaky" shaking shaking   Penicillins Rash   Pt reported all over body rash in 1958 Has patient had [name] PCN reaction causing immediate rash, facial/tongue/throat swelling, SOB or lightheadedness with hypotension:Yes Has patient had [name] PCN reaction causing severe rash involving mucus membranes or skin necrosis: No Has patient had Saarah Dewing PCN reaction that required hospitalization: No Has patient had Annalyssa Thune PCN reaction occurring within the last 10 years:No   Theophyllines Other (See Comments)   Patient becomes "shaky"        Medication List     TAKE these medications    albuterol (2.5 MG/3ML) 0.083% nebulizer solution Commonly known as: PROVENTIL Take 3 mLs (2.5 mg total) by nebulization every 6 (six) hours as needed for wheezing or shortness of breath.   albuterol 108 (90 Base) MCG/ACT inhaler Commonly known as: VENTOLIN HFA INHALE 2 PUFFS BY MOUTH INTO THE LUNGS IF NEEDED FOR WHEEZING AND OR SHORTNESS OF BREATH   Allergy Relief 180 MG tablet Generic drug: fexofenadine TAKE 1 TABLET BY MOUTH EVERY DAY   allopurinol 300 MG tablet Commonly known as: ZYLOPRIM Take 300 mg by mouth daily.   ALPRAZolam 0.5 MG tablet Commonly known as: XANAX TAKE 1 TO 2 TABLETS BY MOUTH TWICE DAILY AS NEEDED FOR ANXIETY OR SLEEP   Arnicare Gel Apply 1 application topically daily as needed (soreness).   azelastine 0.1 % nasal spray Commonly known as: ASTELIN Place 2 sprays into both nostrils daily. Use in each nostril as directed   Biotin 5000 MCG Caps Take 5,000 mcg by mouth  daily.   cholecalciferol 1000 units tablet Commonly known as: VITAMIN D Take 1,000 Units by mouth daily.   co-enzyme Q-10 50 MG capsule Take 400 mg by mouth daily.   colchicine 0.6 MG tablet Take 0.6 mg by mouth daily as needed (flare  up).   cycloSPORINE 0.05 % ophthalmic emulsion Commonly known as: RESTASIS Apply 1 drop to eye 2 (two) times daily as needed (allergies).   EPINEPHrine 0.3 mg/0.3 mL Soaj injection Commonly known as: EPI-PEN Inject 0.3 mg into the muscle as needed for anaphylaxis.   escitalopram 10 MG tablet Commonly known as: LEXAPRO TAKE 1 TABLET(10 MG) BY MOUTH DAILY What changed: See the new instructions.   gabapentin 300 MG capsule Commonly known as: NEURONTIN Take 1 capsule (300 mg total) by mouth 3 (three) times daily. What changed: when to take this   hydrochlorothiazide 25 MG tablet Commonly known as: HYDRODIURIL TAKE 2 TABLETS(50 MG) BY MOUTH DAILY   levalbuterol 45  MCG/ACT inhaler Commonly known as: Xopenex HFA Inhale 2 puffs into the lungs every 6 (six) hours as needed for wheezing or shortness of breath.   levocetirizine 5 MG tablet Commonly known as: XYZAL Take 1 tablet (5 mg total) by mouth every evening.   levofloxacin 750 MG tablet Commonly known as: LEVAQUIN Take 1 tablet (750 mg total) by mouth daily for 1 day.   levothyroxine 75 MCG tablet Commonly known as: SYNTHROID TAKE 1 TABLET(75 MCG) BY MOUTH DAILY   metoprolol succinate 25 MG 24 hr tablet Commonly known as: TOPROL-XL TAKE 1/2 TABLET(12.5 MG) BY MOUTH DAILY   montelukast 10 MG tablet Commonly known as: SINGULAIR Take 1 tablet (10 mg total) by mouth at bedtime. What changed:  when to take this reasons to take this   mupirocin ointment 2 % Commonly known as: Bactroban Use twice Trenese Haft day as directed   nitrofurantoin 50 MG capsule Commonly known as: MACRODANTIN Take 50 mg by mouth 3 (three) times Eleonora Peeler week. Take three days Johnnette Laux week (MWF)   ondansetron 4 MG  tablet Commonly known as: ZOFRAN Take 1 tablet (4 mg total) by mouth every 8 (eight) hours as needed for nausea or vomiting.   potassium chloride SA 20 MEQ tablet Commonly known as: KLOR-CON M TAKE 1 TABLET(20 MEQ) BY MOUTH DAILY   Repatha Pushtronex System 420 MG/3.5ML Soct Generic drug: Evolocumab with Infusor INJECT 1 UNIT INTO THE SKIN EVERY 30 DAYS   rosuvastatin 20 MG tablet Commonly known as: CRESTOR Take 20 mg by mouth 2 (two) times Cedar Ditullio week. Take on Mon & Fri   VITAMIN B COMPLEX PO Take 1 tablet by mouth daily.   VITAMIN B-12 PO Take 1 tablet by mouth daily.   VITAMIN B-6 PO Take 1 tablet by mouth daily.   vitamin C 500 MG tablet Commonly known as: ASCORBIC ACID Take 500 mg by mouth daily.   VITAMIN E PO Take 1 tablet by mouth daily.   Wixela Inhub 500-50 MCG/ACT Aepb Generic drug: fluticasone-salmeterol Inhale 1 puff into the lungs 2 (two) times daily.   Xarelto 20 MG Tabs tablet Generic drug: rivaroxaban TAKE 1 TABLET(20 MG) BY MOUTH DAILY WITH SUPPER What changed: See the new instructions.               Durable Medical Equipment  (From admission, onward)           Start     Ordered   06/15/21 1143  For home use only DME oxygen  Once       Question Answer Comment  Length of Need 12 Months   Mode or (Route) Nasal cannula   Liters per Minute 3   Frequency Continuous (stationary and portable oxygen unit needed)   Oxygen conserving device Yes   Oxygen delivery system Gas      06/15/21 1143           Allergies  Allergen Reactions   Crestor [Rosuvastatin Calcium]     Severe liver function problems   Hydrocodone-Acetaminophen Anxiety and Itching   Lidocaine Hcl Other (See Comments)    Novacaine:  Becomes very shaky   Lipitor [Atorvastatin Calcium] Other (See Comments)    Elevated liver function    Procaine Other (See Comments)    shaking   Rosuvastatin Other (See Comments)    Severe liver function problems   Theophylline Other  (See Comments)    Becomes very shaky skaking skaking   Vicodin [Hydrocodone-Acetaminophen] Itching    Itching all over  Codeine Nausea Only, Anxiety and Other (See Comments)    Also complained of dizziness.  Has same problems with oxycodone and hydrocodone Visual Disturbance, Balance Difficulty, Dizzy "Room Spinning; "Swimming" Balance, vision, nausea, dizzy, "swimming", "room spinning" Balance, vision, nausea, dizzy, "swimming", "room spinning"   Oxycodone-Acetaminophen Nausea Only, Anxiety and Other (See Comments)    Visual Disturbance, Balance Difficulty, Dizzy "Room Spinning; "Swimming"   Propoxyphene Anxiety, Nausea Only and Other (See Comments)    Visual Disturbance, Balance Difficulty, Dizzy "Room Spinning; "Swimming" Balance, vision, nausea, dizzy, "swimming", "room spinning" Balance, vision, nausea, dizzy, "swimming, "room spinning"   Quinine Derivatives Nausea Only    dizzy   Sulfa Antibiotics Nausea Only    Also complained of dizziness   Atorvastatin Other (See Comments)    Severe liver function problems   Ephedrine Other (See Comments)    shaking   Other Other (See Comments)    Quinine causes nausea, dizzy   Oxycodone Other (See Comments)    Balance, vision, nausea, dizzy, "swimming", "room spinning"   Quinine Other (See Comments)   Cephalexin Rash and Other (See Comments)   Epinephrine Other (See Comments)    Patient becomes "shaky" shaking shaking   Penicillins Rash    Pt reported all over body rash in 1958 Has patient had Nalia Honeycutt PCN reaction causing immediate rash, facial/tongue/throat swelling, SOB or lightheadedness with hypotension:Yes Has patient had [name] PCN reaction causing severe rash involving mucus membranes or skin necrosis: No Has patient had [name] PCN reaction that required hospitalization: No Has patient had [name] PCN reaction occurring within the last 10 years:No     Theophyllines Other (See Comments)    Patient becomes "shaky"    Follow-up Information      [facility] Follow up.   Specialty: Home Health Services Why: [facility] physical therapy Contact information: [place] [address] [phone]         [facility] Follow up.   Why: Home oxygen Contact information: [place] [address] [phone]         Mercedes Borg, MD Follow up.   Specialties: Internal Medicine, Radiology Contact information: [place] [address] [phone]         Jettie Booze, MD .   Specialties: Cardiology, Radiology, Interventional Cardiology Contact information: [address] [place] [address] [phone]         [facility] Follow up.   Specialty: Pulmonology Contact information: [address] [place] [address] [phone]                 The results of significant diagnostics from this hospitalization (including imaging, microbiology, ancillary and laboratory) are listed below for reference.    Significant Diagnostic Studies: DG Chest 2 View  Result Date: 06/12/2021 CLINICAL DATA:  Cough, shortness of breath. EXAM: CHEST - 2 VIEW COMPARISON:  May 17, 2021. FINDINGS: Stable cardiomegaly is noted. Mild bibasilar subsegmental atelectasis is noted. Bony thorax is unremarkable. IMPRESSION: Mild bibasilar subsegmental atelectasis. Electronically Signed   By: Marijo Conception M.D.   On: 06/12/2021 13:39   CT Angio Chest Pulmonary Embolism (PE) W or WO Contrast  Result Date: 06/15/2021 CLINICAL DATA:  Chest pain, shortness of breath, fever, hypoxia EXAM: CT ANGIOGRAPHY CHEST WITH CONTRAST TECHNIQUE: Multidetector CT imaging of the chest was performed using the standard protocol during bolus administration of intravenous contrast. Multiplanar CT image reconstructions  and MIPs were obtained to evaluate the vascular anatomy. RADIATION DOSE REDUCTION: This exam was  performed according to the departmental dose-optimization program which includes automated exposure control, adjustment of the mA and/or kV according to patient size and/or use of iterative reconstruction technique. CONTRAST:  29mL OMNIPAQUE IOHEXOL 350 MG/ML SOLN COMPARISON:  CT 03/27/2015.  X-ray 06/12/2021 FINDINGS: Cardiovascular: Satisfactory opacification of the pulmonary arteries. Respiratory motion artifact degrades evaluation of the more distal pulmonary arterial branches, particularly within the lung bases. No filling defect to the segmental branch level to suggest pulmonary embolism. Main pulmonary trunk measures 3.5 cm in diameter. Thoracic aorta is nonaneurysmal. Atherosclerotic calcifications of the aorta and coronary arteries. Heart size is mildly enlarged. No pericardial effusion. Mediastinum/Nodes: No pathologically enlarged axillary, mediastinal, or hilar lymph nodes. Trachea and esophagus within normal limits. Small sliding-type hiatal hernia. Lungs/Pleura: Multifocal areas of airspace consolidation throughout both lungs, much of which is ground-glass. Findings are most confluent within the left upper lobe anteriorly. No pleural effusion or pneumothorax. Upper Abdomen: Grossly stable appearance of Aivan Fillingim left renal cyst. 10 mm left renal stone. Musculoskeletal: Severe osteoarthritis of the right glenohumeral joint with multiple intra-articular loose bodies. Osseous structures appear demineralized. No acute bony findings. Review of the MIP images confirms the above findings. IMPRESSION: 1. Exam is slightly degraded by respiratory motion artifact. No pulmonary embolism is identified within the limitations of this exam. 2. Multifocal areas of airspace consolidation throughout both lungs, most confluent within the left upper lobe anteriorly, suggestive of multifocal pneumonia. 3. Left-sided nephrolithiasis. 4. Small hiatal hernia. 5. Aortic and coronary artery atherosclerosis (ICD10-I70.0). Electronically  Signed   By: Davina Poke D.O.   On: 06/15/2021 13:57   ECHOCARDIOGRAM COMPLETE  Result Date: 06/13/2021    ECHOCARDIOGRAM REPORT   Patient Name:   [name] Date of Exam: 06/13/2021 Medical Rec #:  [id number]   Height:       66.5 in Accession #:    [id number]  Weight:       261.0 lb Date of Birth:  11-Sep-1935   BSA:          2.252 m Patient Age:    82 years    BP:           148/106 mmHg Patient Gender: F           HR:           90 bpm. Exam Location:  Inpatient Procedure: 2D Echo, 3D Echo, Cardiac Doppler and Color Doppler Indications:    Elevated Troponin  History:        Patient has prior history of Echocardiogram examinations, most                 recent 12/10/2019. CAD, COPD, Arrythmias:Bradycardia and Atrial                 Fibrillation; Risk Factors:Hypertension, Sleep Apnea and Former                 Smoker. GOUT. Past history of breast cancer.  Sonographer:    Darlina Sicilian RDCS Referring Phys: [id number] Jari Dipasquale CALDWELL POWELL [place]  1. Left ventricular ejection fraction, by estimation, is 60 to 65%. The left ventricle has normal function. The left ventricle has no regional wall motion abnormalities. There is mild concentric left ventricular hypertrophy. Left ventricular diastolic parameters are indeterminate.  2. Right ventricular systolic function is normal. The right ventricular size is normal. There is moderately elevated pulmonary artery systolic pressure.  3. Left atrial size was moderately dilated.  4. Right atrial size was mildly dilated.  5. No evidence of mitral valve regurgitation.  6. There is mild calcification of the aortic valve. Aortic valve regurgitation is not visualized.  7. The inferior vena cava is dilated in size with <50% respiratory variability, suggesting right atrial pressure of 15 mmHg. Conclusion(s)/Recommendation(s): Compared to prior echo RA pressure increased. FINDINGS  Left Ventricle: Left ventricular ejection fraction, by estimation, is 60 to 65%. The left ventricle  has normal function. The left ventricle has no regional wall motion abnormalities. The left ventricular internal cavity size was normal in size. There is  mild concentric left ventricular hypertrophy. Left ventricular diastolic parameters are indeterminate. Right Ventricle: The right ventricular size is normal. No increase in right ventricular wall thickness. Right ventricular systolic function is normal. There is moderately elevated pulmonary artery systolic pressure. The tricuspid regurgitant velocity is 3.05 m/s, and with an assumed right atrial pressure of 15 mmHg, the estimated right ventricular systolic pressure is 29.9 mmHg. Left Atrium: Left atrial size was moderately dilated. Right Atrium: Right atrial size was mildly dilated. Pericardium: There is no evidence of pericardial effusion. Mitral Valve: No evidence of mitral valve regurgitation. Tricuspid Valve: Tricuspid valve regurgitation is mild. Aortic Valve: There is mild calcification of the aortic valve. Aortic valve regurgitation is not visualized. Pulmonic Valve: The pulmonic valve was grossly normal. Pulmonic valve regurgitation is mild. Aorta: The aortic root and ascending aorta are structurally normal, with no evidence of dilitation. Venous: The inferior vena cava is dilated in size with less than 50% respiratory variability, suggesting right atrial pressure of 15 mmHg. IAS/Shunts: The interatrial septum was not well visualized.  LEFT VENTRICLE PLAX 2D LVIDd:         4.15 cm   Diastology LV PW:         1.20 cm   LV e' medial:    9.96 cm/s LV IVS:        1.20 cm   LV E/e' medial:  14.5 LVOT diam:     2.20 cm   LV e' lateral:   14.25 cm/s LV SV:         63        LV E/e' lateral: 10.2 LV SV Index:   28 LVOT Area:     3.80 cm                           3D Volume EF:                          3D EF:        57 %                          LV EDV:       116 ml                          LV ESV:       50 ml                          LV SV:        66 ml RIGHT  VENTRICLE RV S prime:     12.50 cm/s TAPSE (M-mode): 1.6 cm LEFT ATRIUM  Index        RIGHT ATRIUM           Index LA diam:        4.40 cm 1.95 cm/m   RA Area:     24.50 cm LA Vol (A2C):   79.8 ml 35.44 ml/m  RA Volume:   65.60 ml  29.13 ml/m LA Vol (A4C):   92.6 ml 41.12 ml/m LA Biplane Vol: 90.5 ml 40.19 ml/m  AORTIC VALVE LVOT Vmax:   81.70 cm/s LVOT Vmean:  56.500 cm/s LVOT VTI:    0.165 m  AORTA Ao Root diam: 3.60 cm Ao Asc diam:  3.00 cm MITRAL VALVE                TRICUSPID VALVE MV Area (PHT):  4.64 cm    TR Peak grad:   37.2 mmHg MV Area (plan): 7.34 cm    TR Vmax:        305.00 cm/s MV Decel Time:  163 msec MV E velocity: 144.67 cm/s  SHUNTS                             Systemic VTI:  0.16 m                             Systemic Diam: 2.20 cm Phineas Inches Electronically signed by Phineas Inches Signature Date/Time: 06/13/2021/5:14:36 PM    Final     Microbiology: Recent Results (from the past 240 hour(s))  Resp Panel by RT-PCR (Flu Raeann Offner&B, Covid) Nasopharyngeal Swab     Status: None   Collection Time: 06/12/21  6:25 PM   Specimen: Nasopharyngeal Swab; Nasopharyngeal(NP) swabs in vial transport medium  Result Value Ref Range Status   SARS Coronavirus 2 by RT PCR NEGATIVE NEGATIVE Final    Comment: (NOTE) SARS-CoV-2 target nucleic acids are NOT DETECTED.  The SARS-CoV-2 RNA is generally detectable in upper respiratory specimens during the acute phase of infection. The lowest concentration of SARS-CoV-2 viral copies this assay can detect is 138 copies/mL. Olufemi Mofield negative result does not preclude SARS-Cov-2 infection and should not be used as the sole basis for treatment or other patient management decisions. Hollye Pritt negative result may occur with  improper specimen collection/handling, submission of specimen other than nasopharyngeal swab, presence of viral mutation(s) within the areas targeted by this assay, and inadequate number of viral copies(<138 copies/mL). Cresta Riden negative result must be  combined with clinical observations, patient history, and epidemiological information. The expected result is Negative.  Fact Sheet for Patients:  [name]  Fact Sheet for Healthcare Providers:  [name]  This test is no t yet approved or cleared by the [name] FDA and  has been authorized for detection and/or diagnosis of SARS-CoV-2 by FDA under an Emergency Use Authorization (EUA). This EUA will remain  in effect (meaning this test can be used) for the duration of the COVID-19 declaration under Section 564(b)(1) of the Act, 21 U.S.C.section 360bbb-3(b)(1), unless the authorization is terminated  or revoked sooner.       Influenza Jaysa Kise by PCR NEGATIVE NEGATIVE Final   Influenza B by PCR NEGATIVE NEGATIVE Final    Comment: (NOTE) The Xpert Xpress SARS-CoV-2/FLU/RSV plus assay is intended as an aid in the diagnosis of influenza from Nasopharyngeal swab specimens and should not be used as Nashalie Sallis sole basis for treatment. Nasal washings and aspirates are unacceptable for Xpert Xpress  SARS-CoV-2/FLU/RSV testing.  Fact Sheet for Patients: [name]  Fact Sheet for Healthcare Providers: [name]  This test is not yet approved or cleared by the [name] FDA and has been authorized for detection and/or diagnosis of SARS-CoV-2 by FDA under an Emergency Use Authorization (EUA). This EUA will remain in effect (meaning this test can be used) for the duration of the COVID-19 declaration under Section 564(b)(1) of the Act, 21 U.S.C. section 360bbb-3(b)(1), unless the authorization is terminated or revoked.  Performed at [facility], [place] [address], [place], [place] [address]   Respiratory (~20 pathogens) panel by PCR     Status: None   Collection Time: 06/13/21  9:41 AM   Specimen: Nasopharyngeal Swab; Respiratory  Result Value  Ref Range Status   Adenovirus NOT DETECTED NOT DETECTED Final   Coronavirus 229E NOT DETECTED NOT DETECTED Final    Comment: (NOTE) The Coronavirus on the Respiratory Panel, DOES NOT test for the novel  Coronavirus (2019 nCoV)    Coronavirus HKU1 NOT DETECTED NOT DETECTED Final   Coronavirus NL63 NOT DETECTED NOT DETECTED Final   Coronavirus OC43 NOT DETECTED NOT DETECTED Final   Metapneumovirus NOT DETECTED NOT DETECTED Final   Rhinovirus / Enterovirus NOT DETECTED NOT DETECTED Final   Influenza Lashell Moffitt NOT DETECTED NOT DETECTED Final   Influenza B NOT DETECTED NOT DETECTED Final   Parainfluenza Virus 1 NOT DETECTED NOT DETECTED Final   Parainfluenza Virus 2 NOT DETECTED NOT DETECTED Final   Parainfluenza Virus 3 NOT DETECTED NOT DETECTED Final   Parainfluenza Virus 4 NOT DETECTED NOT DETECTED Final   Respiratory Syncytial Virus NOT DETECTED NOT DETECTED Final   Bordetella pertussis NOT DETECTED NOT DETECTED Final   Bordetella Parapertussis NOT DETECTED NOT DETECTED Final   Chlamydophila pneumoniae NOT DETECTED NOT DETECTED Final   Mycoplasma pneumoniae NOT DETECTED NOT DETECTED Final    Comment: Performed at [facility], [place]. [address] [place], [place] [address]  MRSA Next Gen by PCR, Nasal     Status: None   Collection Time: 06/15/21  2:20 PM   Specimen: Nasal Mucosa; Nasal Swab  Result Value Ref Range Status   MRSA by PCR Next Gen NOT DETECTED NOT DETECTED Final    Comment: (NOTE) The GeneXpert MRSA Assay (FDA approved for NASAL specimens only), is one component of Krystiana Fornes comprehensive MRSA colonization surveillance program. It is not intended to diagnose MRSA infection nor to guide or monitor treatment for MRSA infections. Test performance is not FDA approved in patients less than 40 years old. Performed at [facility], [place] [address], [place], [place] [address]   Expectorated Sputum Assessment w Gram Stain, Rflx to Resp Cult     Status: None   Collection  Time: 06/15/21  3:54 PM   Specimen: Expectorated Sputum  Result Value Ref Range Status   Specimen Description EXPECTORATED SPUTUM  Final   Special Requests NONE  Final   Sputum evaluation   Final    Sputum specimen not acceptable for testing.  Please recollect.   Performed at [facility], [place] [address], [place],  [address]    Report Status 06/15/2021 FINAL  Final  Expectorated Sputum Assessment w Gram Stain, Rflx to Resp Cult     Status: None (Preliminary result)   Collection Time: 06/16/21 11:10 AM   Specimen: Sputum  Result Value Ref Range Status   Specimen Description SPUTUM  Final   Special Requests NONE  Final   Sputum  evaluation   Final    Sputum specimen not acceptable for testing.  Please recollect.   NOTIFIED SILVA,J RN ON 06/16/21 @ 1513 BY GOLSONM Performed at [place] [address], [place], [place] [address]    Report Status PENDING  Incomplete     Labs: Basic Metabolic Panel: Recent Labs  Lab 06/12/21 1826 06/13/21 0243 06/14/21 0425 06/16/21 0502  NA 134* 134* 137 141  K 3.3* 5.0 3.7 3.6  CL 100 101 105 105  CO2 25 23 26 30   GLUCOSE 104* 182* 132* 94  BUN 21 19 24* 20  CREATININE 0.89 0.84 0.83 0.69  CALCIUM 8.9 8.7* 9.0 9.4  MG  --   --  2.3  --   PHOS  --   --  3.0  --    Liver Function Tests: Recent Labs  Lab 06/12/21 1826 06/14/21 0425  AST 29 21  ALT 15 14  ALKPHOS 46 46  BILITOT 2.6* 1.2  PROT 6.7 6.7  ALBUMIN 3.5 3.4*   Recent Labs  Lab 06/12/21 1826  LIPASE 25   No results for input(s): AMMONIA in the last 168 hours. CBC: Recent Labs  Lab 06/12/21 1826 06/13/21 0243 06/14/21 0425 06/16/21 0502  WBC 11.2* 11.5* 14.4* 8.9  NEUTROABS 8.7*  --   --   --   HGB 11.9* 11.9* 11.5* 12.4  HCT 36.8 36.9 36.0 38.6  MCV 99.7 100.5* 100.6* 100.3*  PLT 159 145* 159 204   Cardiac Enzymes: No results for input(s): CKTOTAL, CKMB, CKMBINDEX, TROPONINI in the last 168 hours. BNP: BNP  (last 3 results) Recent Labs    06/12/21 1826  BNP 143.5*    ProBNP (last 3 results) No results for input(s): PROBNP in the last 8760 hours.  CBG: Recent Labs  Lab 06/15/21 1123 06/15/21 1657 06/15/21 1936 06/16/21 0728 06/16/21 1126  GLUCAP 124* 147* 139* 107* 130*       Signed:  Fayrene Helper MD.  [facility] 06/16/2021, 3:21 PM

## 2021-06-16 NOTE — Plan of Care (Signed)
°  Problem: Education: Goal: Knowledge of General Education information will improve Description: Including pain rating scale, medication(s)/side effects and non-pharmacologic comfort measures 06/16/2021 1118 by Tana Conch, RN Outcome: Progressing   Problem: Health Behavior/Discharge Planning: Goal: Ability to manage health-related needs will improve Outcome: Progressing   Problem: Clinical Measurements: Goal: Ability to maintain clinical measurements within normal limits will improve Outcome: Progressing   Problem: Clinical Measurements: Goal: Respiratory complications will improve 06/16/2021 1118 by Tana Conch, RN Outcome: Progressing   Problem: Activity: Goal: Risk for activity intolerance will decrease 06/16/2021 1118 by Tana Conch, RN Outcome: Progressing   Problem: Nutrition: Goal: Adequate nutrition will be maintained Outcome: Progressing

## 2021-06-16 NOTE — Progress Notes (Signed)
SATURATION QUALIFICATIONS: (This note is used to comply with regulatory documentation for home oxygen)  Patient Saturations on Room Air at Rest = 92%  Patient Saturations on Room Air while Ambulating = 88-89%  Patient Saturations on 2 Liters of oxygen while Ambulating = 92-93%  Please briefly explain why patient needs home oxygen: At rest sats around 94-95% room air, therefore oxygen is necessary for when patient ambulates.

## 2021-06-18 ENCOUNTER — Telehealth: Payer: Self-pay

## 2021-06-18 LAB — LEGIONELLA PNEUMOPHILA SEROGP 1 UR AG: L. pneumophila Serogp 1 Ur Ag: NEGATIVE

## 2021-06-18 NOTE — Telephone Encounter (Signed)
Transition Care Management Follow-up Telephone Call Date of discharge and from where: [name] Dx: Acute respiratory failure with hypoxia How have you been since you were released from the hospital? Feeling better  Any questions or concerns? No  Items Reviewed: Did the pt receive and understand the discharge instructions provided? Yes  Medications obtained and verified? Yes  Other? No  Any new allergies since your discharge? No  Dietary orders reviewed? Yes Do you have support at home? Yes   Home Care and Equipment/Supplies: Were home health services ordered? Yes PT If so, what is the name of the agency? [facility]- pt told them not to come   Has the agency set up a time to come to the patient's home? no Were any new equipment or medical supplies ordered?  Yes: oxygen  What is the name of the medical supply agency? [facility]  Were you able to get the supplies/equipment? yes Do you have any questions related to the use of the equipment or supplies? no  Functional Questionnaire: (I = Independent and D = Dependent) ADLs: I  Bathing/Dressing- I  Meal Prep- I  Eating- I  Maintaining continence- I  Transferring/Ambulation- I  Managing Meds- I  Follow up appointments reviewed:  PCP Hospital f/u appt confirmed? Yes  Scheduled to see Dr Jenny Reichmann on 06-21-21 @ [facility] f/u appt confirmed? Yes  Scheduled to see Dr Beau Fanny  on 06-22-21 @ 4pm. Are transportation arrangements needed? No  If their condition worsens, is the pt aware to call PCP or go to the Emergency Dept.? Yes Was the patient provided with contact information for the PCP's office or ED? Yes Was to pt encouraged to call back with questions or concerns? Yes

## 2021-06-18 NOTE — Telephone Encounter (Signed)
Patient admitted to hospital on 2/7 with acute hypoxic respiratory failure

## 2021-06-20 DIAGNOSIS — J9601 Acute respiratory failure with hypoxia: Secondary | ICD-10-CM | POA: Diagnosis not present

## 2021-06-20 DIAGNOSIS — J441 Chronic obstructive pulmonary disease with (acute) exacerbation: Secondary | ICD-10-CM | POA: Diagnosis not present

## 2021-06-21 ENCOUNTER — Encounter: Payer: Self-pay | Admitting: Internal Medicine

## 2021-06-21 ENCOUNTER — Ambulatory Visit (INDEPENDENT_AMBULATORY_CARE_PROVIDER_SITE_OTHER): Payer: Medicare Other | Admitting: Internal Medicine

## 2021-06-21 ENCOUNTER — Other Ambulatory Visit: Payer: Self-pay

## 2021-06-21 DIAGNOSIS — J189 Pneumonia, unspecified organism: Secondary | ICD-10-CM | POA: Diagnosis not present

## 2021-06-21 DIAGNOSIS — J441 Chronic obstructive pulmonary disease with (acute) exacerbation: Secondary | ICD-10-CM

## 2021-06-21 DIAGNOSIS — J9601 Acute respiratory failure with hypoxia: Secondary | ICD-10-CM | POA: Diagnosis not present

## 2021-06-21 DIAGNOSIS — R778 Other specified abnormalities of plasma proteins: Secondary | ICD-10-CM

## 2021-06-21 NOTE — Patient Instructions (Addendum)
You can also take Delsym OTC for cough, and/or Mucinex (or it's generic off brand) for congestion, and tylenol as needed for pain.  Please continue the oxygen as is for now  Please continue all other medications as before, and refills have been done if requested.  Please have the pharmacy call with any other refills you may need.  Please continue your efforts at being more active, low cholesterol diet, and weight control..  Please keep your appointments with your specialists as you may have planned - cardiology tomorrow, and remember to call yourself to see Dr Lamonte Sakai as you mentioned  Please return in 1 month to see about stopping the oxygen

## 2021-06-21 NOTE — Progress Notes (Signed)
Patient ID: [name], female   DOB: [birth year], 86 y.o.   MRN: [id number]        Chief Complaint: follow up recent hospn 2/7 - 2/11       HPI:  MARQUIS DILES is a 86 y.o. female here with above, with recent hospn with acute hypox resp failure, copd exacerbation, CAP, now here overall doing well without fever, cough, worsening sob, wheezing, chills.  Has f/u planned with cardiology tomorrow, plans to call for appt with Dr Lamonte Sakai soon.  Has minor nonprod cough, but sats at home are in mid and lower 80's with exertion off 3L o2 at home.  Pt denies chest pain, orthopnea, PND, increased LE swelling, palpitations, dizziness or syncope.   Pt denies fever, night sweats, loss of appetite, or other constitutional symptoms except for much wt loss with recent illness unable to take po well.        Wt Readings from Last 3 Encounters:  06/22/21 269 lb 9.6 oz (122.3 kg)  06/13/21 261 lb (118.4 kg)  05/08/21 282 lb (127.9 kg)   BP Readings from Last 3 Encounters:  06/22/21 108/64  06/21/21 126/78  06/16/21 (!) 145/87         Past Medical History:  Diagnosis Date   Allergy    Anxiety    Arthritis    no cartlidge in knees   Asthma    Bradycardia    a. atenolol stopped due to this, HR 40s.   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  CAD s/p RCA stent 2000 with residual mild-mod disease of left system 2001.   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 Her smoking use included cigarettes. She has a 4.00 pack-year smoking history. She has never used smokeless tobacco. She reports that she does not drink alcohol and does not use drugs. family history includes Asthma in her sister; Cancer in her father; Heart attack in her brother and father; Heart disease in her brother and father. Allergies  Allergen Reactions   Crestor [Rosuvastatin Calcium]     Severe liver function problems   Hydrocodone-Acetaminophen Anxiety and Itching   Lidocaine Hcl Other (See Comments)    Novacaine:  Becomes very shaky   Lipitor [Atorvastatin Calcium] Other (See Comments)    Elevated liver function    Procaine Other (See Comments)    shaking   Rosuvastatin Other (  See Comments)    Severe liver function problems   Theophylline Other (See Comments)    Becomes very shaky skaking skaking   Vicodin [Hydrocodone-Acetaminophen] Itching    Itching all over   Codeine Nausea Only, Anxiety and Other (See Comments)    Also complained of dizziness.  Has same problems with oxycodone and hydrocodone Visual Disturbance, Balance Difficulty, Dizzy "Room Spinning; "Swimming" Balance, vision, nausea, dizzy, "swimming", "room spinning" Balance, vision, nausea, dizzy, "swimming", "room spinning"   Oxycodone-Acetaminophen Nausea Only, Anxiety and Other (See Comments)    Visual Disturbance, Balance Difficulty, Dizzy "Room Spinning; "Swimming"   Propoxyphene Anxiety, Nausea Only and Other (See Comments)    Visual Disturbance, Balance Difficulty, Dizzy "Room Spinning; "Swimming" Balance, vision,  nausea, dizzy, "swimming", "room spinning" Balance, vision, nausea, dizzy, "swimming, "room spinning"   Quinine Derivatives Nausea Only    dizzy   Sulfa Antibiotics Nausea Only    Also complained of dizziness   Atorvastatin Other (See Comments)    Severe liver function problems   Ephedrine Other (See Comments)    shaking   Other Other (See Comments)    Quinine causes nausea, dizzy   Oxycodone Other (See Comments)    Balance, vision, nausea, dizzy, "swimming", "room spinning"   Quinine Other (See Comments)   Cephalexin Rash and Other (See Comments)   Epinephrine Other (See Comments)    Patient becomes "shaky" shaking shaking   Penicillins Rash    Pt reported all over body rash in 1958 Has patient had a PCN reaction causing immediate rash, facial/tongue/throat swelling, SOB or lightheadedness with hypotension:Yes Has patient had a PCN reaction causing severe rash involving mucus membranes or skin necrosis: No Has patient had a PCN reaction that required hospitalization: No Has patient had a PCN reaction occurring within the last 10 years:No     Theophyllines Other (See Comments)    Patient becomes "shaky"   Current Outpatient Medications on File Prior to Visit  Medication Sig Dispense Refill   albuterol (PROVENTIL) (2.5 MG/3ML) 0.083% nebulizer solution Take 3 mLs (2.5 mg total) by nebulization every 6 (six) hours as needed for wheezing or shortness of breath. 75 mL 12   albuterol (VENTOLIN HFA) 108 (90 Base) MCG/ACT inhaler INHALE 2 PUFFS BY MOUTH INTO THE LUNGS IF NEEDED FOR WHEEZING AND OR SHORTNESS OF BREATH 8.5 g 11   ALLERGY RELIEF 180 MG tablet TAKE 1 TABLET BY MOUTH EVERY DAY 90 tablet 1   allopurinol (ZYLOPRIM) 300 MG tablet 1 tablet     ALPRAZolam (XANAX) 0.5 MG tablet TAKE 1 TO 2 TABLETS BY MOUTH TWICE DAILY AS NEEDED FOR ANXIETY OR SLEEP 60 tablet 2   Ascorbic Acid (VITAMIN C) 1000 MG tablet 1 tablet     azelastine (ASTELIN) 0.1 % nasal spray Place 2 sprays into both  nostrils daily. Use in each nostril as directed 30 mL 5   B Complex Vitamins (VITAMIN B COMPLEX PO) Take 1 tablet by mouth daily.     Biotin 5000 MCG CAPS Take 5,000 mcg by mouth daily.      budesonide-formoterol (SYMBICORT) 80-4.5 MCG/ACT inhaler 2 puffs     cholecalciferol (VITAMIN D) 1000 UNITS tablet Take 1,000 Units by mouth daily.     co-enzyme Q-10 50 MG capsule Take 400 mg by mouth daily.     colchicine 0.6 MG tablet Take 0.6 mg by mouth daily as needed (flare  up).      Cyanocobalamin (VITAMIN B-12 PO) Take 1 tablet by mouth daily.  cycloSPORINE (RESTASIS) 0.05 % ophthalmic emulsion Apply 1 drop to eye 2 (two) times daily as needed (allergies).     EPINEPHrine 0.3 mg/0.3 mL IJ SOAJ injection Inject 0.3 mg into the muscle as needed for anaphylaxis.     escitalopram (LEXAPRO) 10 MG tablet TAKE 1 TABLET(10 MG) BY MOUTH DAILY 90 tablet 2   ezetimibe (ZETIA) 10 MG tablet 1/2 tablet     fexofenadine (ALLEGRA) 180 MG tablet 1 tablet as needed     gabapentin (NEURONTIN) 300 MG capsule Take 1 capsule (300 mg total) by mouth 3 (three) times daily. (Patient taking differently: Take 300 mg by mouth at bedtime.) 270 capsule 1   Homeopathic Products (ARNICARE) GEL Apply 1 application topically daily as needed (soreness).     hydrochlorothiazide (HYDRODIURIL) 25 MG tablet TAKE 2 TABLETS(50 MG) BY MOUTH DAILY 180 tablet 1   letrozole (FEMARA) 2.5 MG tablet 1 tablet     levalbuterol (XOPENEX HFA) 45 MCG/ACT inhaler Inhale 2 puffs into the lungs every 6 (six) hours as needed for wheezing or shortness of breath. 3 Inhaler 3   levocetirizine (XYZAL) 5 MG tablet Take 1 tablet (5 mg total) by mouth every evening. 30 tablet 11   levothyroxine (SYNTHROID) 75 MCG tablet TAKE 1 TABLET(75 MCG) BY MOUTH DAILY 90 tablet 1   metoprolol succinate (TOPROL-XL) 25 MG 24 hr tablet TAKE 1/2 TABLET(12.5 MG) BY MOUTH DAILY 45 tablet 3   montelukast (SINGULAIR) 10 MG tablet Take 1 tablet (10 mg total) by mouth at bedtime.  (Patient taking differently: Take 10 mg by mouth at bedtime as needed (allergies).) 90 tablet 1   mupirocin ointment (BACTROBAN) 2 % Use twice a day as directed 30 g 1   nitrofurantoin (MACRODANTIN) 50 MG capsule Take 50 mg by mouth 3 (three) times a week. Take three days a week (MWF)     Omega-3 Fatty Acids (FISH OIL) 1000 MG CAPS 1 capsule     ondansetron (ZOFRAN) 4 MG tablet Take 1 tablet (4 mg total) by mouth every 8 (eight) hours as needed for nausea or vomiting. 40 tablet 3   potassium chloride SA (KLOR-CON M) 20 MEQ tablet TAKE 1 TABLET(20 MEQ) BY MOUTH DAILY 90 tablet 3   Pyridoxine HCl (VITAMIN B-6 PO) Take 1 tablet by mouth daily.     Senatobia 420 MG/3.5ML SOCT INJECT 1 UNIT INTO THE SKIN EVERY 30 DAYS 3.5 mL 11   rosuvastatin (CRESTOR) 20 MG tablet Take 20 mg by mouth 2 (two) times a week. Take on Mon & Fri     vitamin C (ASCORBIC ACID) 500 MG tablet Take 500 mg by mouth daily.     VITAMIN E PO Take 1 tablet by mouth daily.     WIXELA INHUB 500-50 MCG/DOSE AEPB Inhale 1 puff into the lungs 2 (two) times daily. 60 each 11   XARELTO 20 MG TABS tablet TAKE 1 TABLET(20 MG) BY MOUTH DAILY WITH SUPPER (Patient taking differently: 20 mg every evening.) 90 tablet 1   Zinc 100 MG TABS 1 tablet     No current facility-administered medications on file prior to visit.        ROS:  All others reviewed and negative.  Objective        PE:  BP 126/78 (BP Location: Left Arm, Patient Position: Sitting, Cuff Size: Large)    Pulse 90    Temp 99.2 F (37.3 C) (Oral)    Ht 5' 6.5" (1.689 m)    SpO2  95%    BMI 41.50 kg/m                 Constitutional: Pt appears in NAD               HENT: Head: NCAT.                Right Ear: External ear normal.                 Left Ear: External ear normal.                Eyes: . Pupils are equal, round, and reactive to light. Conjunctivae and EOM are normal               Nose: without d/c or deformity               Neck: Neck supple. Gross  normal ROM               Cardiovascular: Normal rate and regular rhythm.                 Pulmonary/Chest: Effort normal and breath sounds without rales or wheezing.                Abd:  Soft, NT, ND, + BS, no organomegaly               Neurological: Pt is alert. At baseline orientation, motor grossly intact               Skin: Skin is warm. No rashes, no other new lesions, LE edema - trace bilateral               Psychiatric: Pt behavior is normal without agitation   Micro: none  Cardiac tracings I have personally interpreted today:  none  Pertinent Radiological findings (summarize): none   Lab Results  Component Value Date   WBC 8.9 06/16/2021   HGB 12.4 06/16/2021   HCT 38.6 06/16/2021   PLT 204 06/16/2021   GLUCOSE 94 06/16/2021   CHOL 168 04/19/2021   TRIG 116.0 04/19/2021   HDL 61.30 04/19/2021   LDLDIRECT 107.0 09/17/2016   LDLCALC 84 04/19/2021   ALT 14 06/14/2021   AST 21 06/14/2021   NA 141 06/16/2021   K 3.6 06/16/2021   CL 105 06/16/2021   CREATININE 0.69 06/16/2021   BUN 20 06/16/2021   CO2 30 06/16/2021   TSH 7.42 (H) 04/19/2021   HGBA1C 5.8 (H) 06/14/2021   Assessment/Plan:  KINNEDY MONGIELLO is a 86 y.o. 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  Acute respiratory failure with hypoxia (HCC) Now more chronic persistent, to cont home o2 3L continuous, and f/u 1 mo here to see if can be weaned  CAP (community  acquired pneumonia) Clinically resolved,  to f/u any worsening symptoms or concerns  COPD exacerbation ([place]) Improved, no new further tx needed, cont current tx  Elevated troponin Has f/u with cardiology tomorrow, denies further CP  Followup: Return in about 4 weeks (around 07/19/2021).  Cathlean Cower, MD 06/24/2021 9:15 PM [facility]

## 2021-06-21 NOTE — Progress Notes (Signed)
Cardiology Office Note   Date:  06/22/2021   ID:  [name], DOB June 03, 1935, MRN [id number]  PCP:  [name], MD    No chief complaint on file.    Wt Readings from Last 3 Encounters:  06/22/21 269 lb 9.6 oz (122.3 kg)  06/13/21 261 lb (118.4 kg)  05/08/21 282 lb (127.9 kg)       History of Present Illness: [name] is a 86 y.o. female  with history of chronic atrial fib, morbid obesity, CAD s/p RCA stent 2000 with residual mild-mod disease of left system 2001, hyperlipidemia, asthma, breast CA, anxiety, arthritis, HTN, gout, OSA, hypothyroidism, lower extremity edema, bradycardia who presents for yearly follow-up.   Last cath 2001 showed 50% mD1, 20-25% LCx, 20-25% ISR of RCA stent. Last nuc 01/2011 showed small partially reversible defect in anterolateral area, could represent ischemia but could be due to breast attenuation, EF 67%. She was noted to be bradycardic in the 40s in 03/2017 so atenolol was stopped. She noticed after stopping atenolol that she would feel her heart pound faster when she did activity so low dose metoprolol 12.5 mg BID was added back.    She had an abscess in the left shin.  She has been told she had a vein problem.  She gets a cellulitis at times in the left shin and uses antibiotics. Rare leg swelling.    She was feeling well in 2020 and careful during the pandemic. Zetia stopped in 2020.  Repatha continued.    In the past, she has had some anxiety issues that resolve with Xanax.  She has continued issues with asthma.    n July 2021, she had vascular congestion noted on CXR.  Echo was done and showed: "Left ventricular ejection fraction, by estimation, is 60 to 65%. The  left ventricle has normal function. The left ventricle has no regional  wall motion abnormalities. Left ventricular diastolic parameters are  indeterminate. Elevated left ventricular  end-diastolic pressure.   2. Right ventricular systolic function is normal. The right  ventricular  size is normal. There is moderately elevated pulmonary artery systolic  pressure.   3. Left atrial size was severely dilated.   4. Right atrial size was mildly dilated.   5. The mitral valve is normal in structure. Trivial mitral valve  regurgitation. No evidence of mitral stenosis.   6. The aortic valve is tricuspid. Aortic valve regurgitation is not  visualized. No aortic stenosis is present.   7. The inferior vena cava is normal in size with greater than 50%  respiratory variability, suggesting right atrial pressure of 3 mmHg."    Furosemide was given instead of HCTZ.   Husband passed away in 20-Apr-2021 suddenly.   Admitted in 06/2021 with pneumonia: "Elevated troponin- (present on admission) Troponin downtrending, relatively low - suspect demand EKG with atrial fibrillation, PVC's, IVCD Denies CP - suspect this is demand ischemia, not c/w ACS Follow echo -> echo with EF 60-65%, no RWMA, mild concentric LVH, moderately elevated PASP"  Echo in the hospital showed: "Left ventricular ejection fraction, by estimation, is 60 to 65%. The  left ventricle has normal function. The left ventricle has no regional  wall motion abnormalities. There is mild concentric left ventricular  hypertrophy. Left ventricular diastolic  parameters are indeterminate.   2. Right ventricular systolic function is normal. The right ventricular  size is normal. There is moderately elevated pulmonary artery systolic  pressure.   3. Left atrial  size was moderately dilated.   4. Right atrial size was mildly dilated.   5. No evidence of mitral valve regurgitation.   6. There is mild calcification of the aortic valve. Aortic valve  regurgitation is not visualized.   7. The inferior vena cava is dilated in size with <50% respiratory  variability, suggesting right atrial pressure of 15 mmHg. "  Past Medical History:  Diagnosis Date   Allergy    Anxiety    Arthritis    no cartlidge in knees    Asthma    Bradycardia    a. atenolol stopped due to this, HR 40s.   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  CAD s/p RCA stent 2000 with residual mild-mod disease of left system 2001.   Chronic atrial fibrillation ([facility])    COPD (chronic obstructive pulmonary disease) ([place])    Essential hypertension 03/24/2015   Gout    post operative   Hx of radiation therapy 04/02/11 -05/20/11   left breast   Hyperlipidemia    Hypothyroidism 03/24/2015   Incontinence of urine    Malignant neoplasm of upper-outer quadrant of female breast ([place]) 12/26/2010   Osteoporosis 03/24/2015   Peripheral neuropathy 03/24/2015   Plantar fasciitis    Sleep apnea     Past Surgical History:  Procedure Laterality Date   BREAST LUMPECTOMY W/ NEEDLE LOCALIZATION  01/29/2011   Left with [facility] Dr Margot Chimes   CHOLECYSTECTOMY  1955   CORONARY ANGIOPLASTY WITH STENT PLACEMENT  2000   DILATION AND CURETTAGE OF UTERUS  1976   and 1996   [place]   tendons between thumb and forefinger   SKIN BIOPSY     1994, 2006, 2008, 2010, 2011, 2011, 2012-  pre-cancerous   TONSILLECTOMY  1955     Current Outpatient Medications  Medication Sig Dispense Refill   albuterol (PROVENTIL) (2.5 MG/3ML) 0.083% nebulizer solution Take 3 mLs (2.5 mg total) by nebulization every 6 (six) hours as needed for wheezing or shortness of breath. 75 mL 12   albuterol (VENTOLIN HFA) 108 (90 Base) MCG/ACT inhaler INHALE 2 PUFFS BY MOUTH INTO THE LUNGS IF NEEDED FOR WHEEZING AND OR SHORTNESS OF BREATH 8.5 g 11   ALLERGY RELIEF 180 MG tablet TAKE 1 TABLET BY MOUTH EVERY DAY 90 tablet 1   allopurinol (ZYLOPRIM) 300 MG tablet 1 tablet     ALPRAZolam (XANAX) 0.5 MG tablet TAKE 1 TO 2 TABLETS BY MOUTH TWICE DAILY AS NEEDED FOR ANXIETY OR SLEEP 60 tablet 2   Ascorbic Acid (VITAMIN C) 1000 MG tablet 1 tablet     azelastine (ASTELIN) 0.1 % nasal spray Place 2 sprays into both nostrils daily. Use in each  nostril as directed 30 mL 5   B Complex Vitamins (VITAMIN B COMPLEX PO) Take 1 tablet by mouth daily.     Biotin 5000 MCG CAPS Take 5,000 mcg by mouth daily.      budesonide-formoterol (SYMBICORT) 80-4.5 MCG/ACT inhaler 2 puffs     cholecalciferol (VITAMIN D) 1000 UNITS tablet Take 1,000 Units by mouth daily.     co-enzyme Q-10 50 MG capsule Take 400 mg by mouth daily.     colchicine 0.6 MG tablet Take 0.6 mg by mouth daily as needed (flare  up).      Cyanocobalamin (VITAMIN B-12 PO) Take 1 tablet by mouth daily.     cycloSPORINE (RESTASIS) 0.05 % ophthalmic emulsion  Apply 1 drop to eye 2 (two) times daily as needed (allergies).     doxycycline (VIBRA-TABS) 100 MG tablet Take 1 tablet (100 mg total) by mouth 2 (two) times daily. 20 tablet 0   EPINEPHrine 0.3 mg/0.3 mL IJ SOAJ injection Inject 0.3 mg into the muscle as needed for anaphylaxis.     escitalopram (LEXAPRO) 10 MG tablet TAKE 1 TABLET(10 MG) BY MOUTH DAILY 90 tablet 2   ezetimibe (ZETIA) 10 MG tablet 1/2 tablet     fexofenadine (ALLEGRA) 180 MG tablet 1 tablet as needed     gabapentin (NEURONTIN) 300 MG capsule Take 1 capsule (300 mg total) by mouth 3 (three) times daily. (Patient taking differently: Take 300 mg by mouth at bedtime.) 270 capsule 1   Homeopathic Products (ARNICARE) GEL Apply 1 application topically daily as needed (soreness).     hydrochlorothiazide (HYDRODIURIL) 25 MG tablet TAKE 2 TABLETS(50 MG) BY MOUTH DAILY 180 tablet 1   letrozole (FEMARA) 2.5 MG tablet 1 tablet     levalbuterol (XOPENEX HFA) 45 MCG/ACT inhaler Inhale 2 puffs into the lungs every 6 (six) hours as needed for wheezing or shortness of breath. 3 Inhaler 3   levocetirizine (XYZAL) 5 MG tablet Take 1 tablet (5 mg total) by mouth every evening. 30 tablet 11   levothyroxine (SYNTHROID) 75 MCG tablet TAKE 1 TABLET(75 MCG) BY MOUTH DAILY 90 tablet 1   metoprolol succinate (TOPROL-XL) 25 MG 24 hr tablet TAKE 1/2 TABLET(12.5 MG) BY MOUTH DAILY 45 tablet 3    montelukast (SINGULAIR) 10 MG tablet Take 1 tablet (10 mg total) by mouth at bedtime. (Patient taking differently: Take 10 mg by mouth at bedtime as needed (allergies).) 90 tablet 1   mupirocin ointment (BACTROBAN) 2 % Use twice a day as directed 30 g 1   nitrofurantoin (MACRODANTIN) 50 MG capsule Take 50 mg by mouth 3 (three) times a week. Take three days a week (MWF)     Omega-3 Fatty Acids (FISH OIL) 1000 MG CAPS 1 capsule     ondansetron (ZOFRAN) 4 MG tablet Take 1 tablet (4 mg total) by mouth every 8 (eight) hours as needed for nausea or vomiting. 40 tablet 3   potassium chloride SA (KLOR-CON M) 20 MEQ tablet TAKE 1 TABLET(20 MEQ) BY MOUTH DAILY 90 tablet 3   Pyridoxine HCl (VITAMIN B-6 PO) Take 1 tablet by mouth daily.     [place] 420 MG/3.5ML SOCT INJECT 1 UNIT INTO THE SKIN EVERY 30 DAYS 3.5 mL 11   rosuvastatin (CRESTOR) 20 MG tablet Take 20 mg by mouth 2 (two) times a week. Take on Mon & Fri     vitamin C (ASCORBIC ACID) 500 MG tablet Take 500 mg by mouth daily.     VITAMIN E PO Take 1 tablet by mouth daily.     WIXELA INHUB 500-50 MCG/DOSE AEPB Inhale 1 puff into the lungs 2 (two) times daily. 60 each 11   XARELTO 20 MG TABS tablet TAKE 1 TABLET(20 MG) BY MOUTH DAILY WITH SUPPER (Patient taking differently: 20 mg every evening.) 90 tablet 1   Zinc 100 MG TABS 1 tablet     No current facility-administered medications for this visit.    Allergies:   Crestor [rosuvastatin calcium], Hydrocodone-acetaminophen, Lidocaine hcl, Lipitor [atorvastatin calcium], Procaine, Rosuvastatin, Theophylline, Vicodin [hydrocodone-acetaminophen], Codeine, Oxycodone-acetaminophen, Propoxyphene, Quinine derivatives, Sulfa antibiotics, Atorvastatin, Ephedrine, Other, Oxycodone, Quinine, Cephalexin, Epinephrine, Penicillins, and Theophyllines    Social History:  The patient  reports that she  quit smoking about 58 years ago. Her smoking use included cigarettes. She has a 4.00 pack-year smoking  history. She has never used smokeless tobacco. She reports that she does not drink alcohol and does not use drugs.   Family History:  The patient's family history includes Asthma in her sister; Cancer in her father; Heart attack in her brother and father; Heart disease in her brother and father.    ROS:  Please see the history of present illness.   Otherwise, review of systems are positive for episodic chills, fever- will be starting another antibiotic.  All other systems are reviewed and negative.    PHYSICAL EXAM: VS:  BP 108/64    Pulse 91    Ht 5' 6.5" (1.689 m)    Wt 269 lb 9.6 oz (122.3 kg)    SpO2 98%    BMI 42.86 kg/m  , BMI Body mass index is 42.86 kg/m. GEN: Well nourished, well developed, in no acute distress HEENT: normal Neck: no JVD, carotid bruits, or masses Cardiac: RRR; no murmurs, rubs, or gallops,no edema  Respiratory:  clear to auscultation bilaterally, normal work of breathing GI: soft, nontender, nondistended, + BS MS: no deformity or atrophy Skin: warm and dry, no rash Neuro:  Strength and sensation are intact Psych: euthymic mood, full affect   EKG:   The ekg ordered 06/12/21 demonstrates AFib, rate controlled, PVCs   Recent Labs: 04/19/2021: TSH 7.42 06/12/2021: B Natriuretic Peptide 143.5 06/14/2021: ALT 14; Magnesium 2.3 06/16/2021: BUN 20; Creatinine, Ser 0.69; Hemoglobin 12.4; Platelets 204; Potassium 3.6; Sodium 141   Lipid Panel    Component Value Date/Time   CHOL 168 04/19/2021 1135   CHOL 133 06/09/2019 0921   TRIG 116.0 04/19/2021 1135   HDL 61.30 04/19/2021 1135   HDL 57 06/09/2019 0921   CHOLHDL 3 04/19/2021 1135   VLDL 23.2 04/19/2021 1135   LDLCALC 84 04/19/2021 1135   LDLCALC 57 06/09/2019 0921   LDLDIRECT 107.0 09/17/2016 1221     Other studies Reviewed: Additional studies/ records that were reviewed today with results demonstrating: 06/12/20 ECG showed AFib, PVC. Nonspecific ST changes.   ASSESSMENT AND PLAN:  Chronic atrial  fibrillation: Rate control strategy.  Anticoagulation for stroke prevention.  No significant issues while hospitalized for pneumonia with her atrial fibrillation. Chronic diastolic heart failure: Appears euvolemic.  Avoid processed salt and processed foods.  Stable creatinine. CAD: No clear angina.  COntinue aggressive secondary prevention.  Normal EF by recent echo.  Bradycardia: Rate controlled at this time.  Hyperlipidemia: Whole food, plant-based diet recommended.  TOlerating repatha. LDL 84.  Anticoagulated: Xarelto for acquired thrombophilia. Gust calcium scoring CT with her daughter given that both parents have coronary artery disease.   Current medicines are reviewed at length with the patient today.  The patient concerns regarding her medicines were addressed.  The following changes have been made:  No change  Labs/ tests ordered today include:  No orders of the defined types were placed in this encounter.   Recommend 150 minutes/week of aerobic exercise Low fat, low carb, high fiber diet recommended  Disposition:   FU in 6 months   Signed, Larae Grooms, MD  06/22/2021 4:40 PM    [facility] [address], [place], [place]  [address] Phone: [phone]; Fax: [phone]

## 2021-06-22 ENCOUNTER — Ambulatory Visit: Payer: Medicare Other | Admitting: Interventional Cardiology

## 2021-06-22 ENCOUNTER — Encounter: Payer: Self-pay | Admitting: Interventional Cardiology

## 2021-06-22 ENCOUNTER — Telehealth: Payer: Self-pay | Admitting: Internal Medicine

## 2021-06-22 VITALS — BP 108/64 | HR 91 | Ht 66.5 in | Wt 269.6 lb

## 2021-06-22 DIAGNOSIS — I482 Chronic atrial fibrillation, unspecified: Secondary | ICD-10-CM

## 2021-06-22 DIAGNOSIS — E782 Mixed hyperlipidemia: Secondary | ICD-10-CM

## 2021-06-22 DIAGNOSIS — I2510 Atherosclerotic heart disease of native coronary artery without angina pectoris: Secondary | ICD-10-CM

## 2021-06-22 DIAGNOSIS — R600 Localized edema: Secondary | ICD-10-CM

## 2021-06-22 DIAGNOSIS — D6869 Other thrombophilia: Secondary | ICD-10-CM

## 2021-06-22 DIAGNOSIS — I5032 Chronic diastolic (congestive) heart failure: Secondary | ICD-10-CM | POA: Diagnosis not present

## 2021-06-22 MED ORDER — DOXYCYCLINE HYCLATE 100 MG PO TABS: 100.0000 mg | ORAL_TABLET | Freq: Two times a day (BID) | ORAL | 0 refills | Status: DC

## 2021-06-22 NOTE — Telephone Encounter (Signed)
Called patient and informed her that her medication was sent to her pharmacy  

## 2021-06-22 NOTE — Telephone Encounter (Signed)
[place] for doxycycline- done erx

## 2021-06-22 NOTE — Telephone Encounter (Signed)
Pt states after leaving her ov yesterday she developed a fever of 102, pt requesting providers recommendations if another round of antibiotics is needed  Pt requesting a c/b

## 2021-06-22 NOTE — Patient Instructions (Signed)
Medication Instructions:  °Your physician recommends that you continue on your current medications as directed. Please refer to the Current Medication list given to you today. ° °*If you need a refill on your cardiac medications before your next appointment, please call your pharmacy* ° ° °Lab Work: °none °If you have labs (blood work) drawn today and your tests are completely normal, you will receive your results only by: °MyChart Message (if you have MyChart) OR °A paper copy in the mail °If you have any lab test that is abnormal or we need to change your treatment, we will call you to review the results. ° ° °Testing/Procedures: °none ° ° °Follow-Up: °At [facility], you and your health needs are our priority.  As part of our continuing mission to provide you with exceptional heart care, we have created designated Provider Care Teams.  These Care Teams include your primary Cardiologist (physician) and Advanced Practice Providers (APPs -  Physician Assistants and Nurse Practitioners) who all work together to provide you with the care you need, when you need it. ° °We recommend signing up for the patient portal called "MyChart".  Sign up information is provided on this After Visit Summary.  MyChart is used to connect with patients for Virtual Visits (Telemedicine).  Patients are able to view lab/test results, encounter notes, upcoming appointments, etc.  Non-urgent messages can be sent to your provider as well.   °To learn more about what you can do with MyChart, go to [email or web address].   ° °Your next appointment:   °6 month(s) ° °The format for your next appointment:   °In Person ° °Provider:   °Jayadeep Varanasi, MD   ° ° °Other Instructions ° ° °

## 2021-06-24 ENCOUNTER — Encounter: Payer: Self-pay | Admitting: Internal Medicine

## 2021-06-24 NOTE — Assessment & Plan Note (Signed)
Has f/u with cardiology tomorrow, denies further CP

## 2021-06-24 NOTE — Assessment & Plan Note (Signed)
Clinically resolved,  to f/u any worsening symptoms or concerns 

## 2021-06-24 NOTE — Assessment & Plan Note (Signed)
Improved, no new further tx needed, cont current tx

## 2021-06-24 NOTE — Assessment & Plan Note (Signed)
Now more chronic persistent, to cont home o2 3L continuous, and f/u 1 mo here to see if can be weaned

## 2021-06-26 ENCOUNTER — Ambulatory Visit: Payer: Medicare Other | Admitting: Nurse Practitioner

## 2021-06-26 ENCOUNTER — Ambulatory Visit (INDEPENDENT_AMBULATORY_CARE_PROVIDER_SITE_OTHER): Payer: Medicare Other

## 2021-06-26 ENCOUNTER — Encounter: Payer: Self-pay | Admitting: Nurse Practitioner

## 2021-06-26 ENCOUNTER — Other Ambulatory Visit: Payer: Self-pay

## 2021-06-26 ENCOUNTER — Encounter (HOSPITAL_COMMUNITY): Payer: Self-pay

## 2021-06-26 ENCOUNTER — Inpatient Hospital Stay (HOSPITAL_COMMUNITY)
Admission: EM | Admit: 2021-06-26 | Discharge: 2021-07-01 | DRG: 193 | Disposition: A | Discharge status: Home Health | Payer: Medicare Other | Source: Ambulatory Visit | Attending: Internal Medicine | Admitting: Internal Medicine

## 2021-06-26 VITALS — BP 118/60 | HR 72 | Temp 98.5°F | Ht 66.5 in | Wt 258.0 lb

## 2021-06-26 DIAGNOSIS — Z8249 Family history of ischemic heart disease and other diseases of the circulatory system: Secondary | ICD-10-CM | POA: Diagnosis not present

## 2021-06-26 DIAGNOSIS — R0602 Shortness of breath: Secondary | ICD-10-CM | POA: Diagnosis not present

## 2021-06-26 DIAGNOSIS — I499 Cardiac arrhythmia, unspecified: Secondary | ICD-10-CM | POA: Diagnosis not present

## 2021-06-26 DIAGNOSIS — J189 Pneumonia, unspecified organism: Secondary | ICD-10-CM | POA: Diagnosis present

## 2021-06-26 DIAGNOSIS — J4551 Severe persistent asthma with (acute) exacerbation: Secondary | ICD-10-CM | POA: Diagnosis not present

## 2021-06-26 DIAGNOSIS — Z79811 Long term (current) use of aromatase inhibitors: Secondary | ICD-10-CM | POA: Diagnosis not present

## 2021-06-26 DIAGNOSIS — E039 Hypothyroidism, unspecified: Secondary | ICD-10-CM | POA: Diagnosis present

## 2021-06-26 DIAGNOSIS — F419 Anxiety disorder, unspecified: Secondary | ICD-10-CM | POA: Diagnosis present

## 2021-06-26 DIAGNOSIS — J441 Chronic obstructive pulmonary disease with (acute) exacerbation: Secondary | ICD-10-CM | POA: Diagnosis not present

## 2021-06-26 DIAGNOSIS — Z6841 Body Mass Index (BMI) 40.0 and over, adult: Secondary | ICD-10-CM

## 2021-06-26 DIAGNOSIS — M109 Gout, unspecified: Secondary | ICD-10-CM | POA: Diagnosis not present

## 2021-06-26 DIAGNOSIS — F32A Depression, unspecified: Secondary | ICD-10-CM | POA: Diagnosis present

## 2021-06-26 DIAGNOSIS — G4733 Obstructive sleep apnea (adult) (pediatric): Secondary | ICD-10-CM

## 2021-06-26 DIAGNOSIS — Z7901 Long term (current) use of anticoagulants: Secondary | ICD-10-CM

## 2021-06-26 DIAGNOSIS — J9601 Acute respiratory failure with hypoxia: Secondary | ICD-10-CM

## 2021-06-26 DIAGNOSIS — R509 Fever, unspecified: Secondary | ICD-10-CM | POA: Diagnosis not present

## 2021-06-26 DIAGNOSIS — K224 Dyskinesia of esophagus: Secondary | ICD-10-CM | POA: Diagnosis not present

## 2021-06-26 DIAGNOSIS — E876 Hypokalemia: Secondary | ICD-10-CM | POA: Diagnosis not present

## 2021-06-26 DIAGNOSIS — I482 Chronic atrial fibrillation, unspecified: Secondary | ICD-10-CM | POA: Diagnosis not present

## 2021-06-26 DIAGNOSIS — E785 Hyperlipidemia, unspecified: Secondary | ICD-10-CM | POA: Diagnosis not present

## 2021-06-26 DIAGNOSIS — J45901 Unspecified asthma with (acute) exacerbation: Secondary | ICD-10-CM

## 2021-06-26 DIAGNOSIS — R0689 Other abnormalities of breathing: Secondary | ICD-10-CM | POA: Diagnosis not present

## 2021-06-26 DIAGNOSIS — Z79890 Hormone replacement therapy (postmenopausal): Secondary | ICD-10-CM | POA: Diagnosis not present

## 2021-06-26 DIAGNOSIS — I10 Essential (primary) hypertension: Secondary | ICD-10-CM | POA: Diagnosis present

## 2021-06-26 DIAGNOSIS — Z743 Need for continuous supervision: Secondary | ICD-10-CM | POA: Diagnosis not present

## 2021-06-26 DIAGNOSIS — I495 Sick sinus syndrome: Secondary | ICD-10-CM | POA: Diagnosis present

## 2021-06-26 DIAGNOSIS — Z955 Presence of coronary angioplasty implant and graft: Secondary | ICD-10-CM | POA: Diagnosis not present

## 2021-06-26 DIAGNOSIS — Z87891 Personal history of nicotine dependence: Secondary | ICD-10-CM | POA: Diagnosis not present

## 2021-06-26 DIAGNOSIS — Z853 Personal history of malignant neoplasm of breast: Secondary | ICD-10-CM | POA: Diagnosis not present

## 2021-06-26 DIAGNOSIS — R5381 Other malaise: Secondary | ICD-10-CM

## 2021-06-26 DIAGNOSIS — I509 Heart failure, unspecified: Secondary | ICD-10-CM | POA: Diagnosis not present

## 2021-06-26 DIAGNOSIS — Z20822 Contact with and (suspected) exposure to covid-19: Secondary | ICD-10-CM | POA: Diagnosis not present

## 2021-06-26 DIAGNOSIS — Z79899 Other long term (current) drug therapy: Secondary | ICD-10-CM

## 2021-06-26 DIAGNOSIS — Z825 Family history of asthma and other chronic lower respiratory diseases: Secondary | ICD-10-CM

## 2021-06-26 DIAGNOSIS — R0902 Hypoxemia: Secondary | ICD-10-CM | POA: Diagnosis not present

## 2021-06-26 DIAGNOSIS — I517 Cardiomegaly: Secondary | ICD-10-CM | POA: Diagnosis not present

## 2021-06-26 DIAGNOSIS — R1310 Dysphagia, unspecified: Secondary | ICD-10-CM

## 2021-06-26 LAB — CBC WITH DIFFERENTIAL/PLATELET
Abs Immature Granulocytes: 0.06 10*3/uL (ref 0.00–0.07)
Basophils Absolute: 0 10*3/uL (ref 0.0–0.1)
Basophils Relative: 0 %
Eosinophils Absolute: 0.2 10*3/uL (ref 0.0–0.5)
Eosinophils Relative: 1 %
HCT: 36.5 % (ref 36.0–46.0)
Hemoglobin: 12 g/dL (ref 12.0–15.0)
Immature Granulocytes: 0 %
Lymphocytes Relative: 6 %
Lymphs Abs: 0.8 10*3/uL (ref 0.7–4.0)
MCH: 32.8 pg (ref 26.0–34.0)
MCHC: 32.9 g/dL (ref 30.0–36.0)
MCV: 99.7 fL (ref 80.0–100.0)
Monocytes Absolute: 1.6 10*3/uL — ABNORMAL HIGH (ref 0.1–1.0)
Monocytes Relative: 12 %
Neutro Abs: 10.9 10*3/uL — ABNORMAL HIGH (ref 1.7–7.7)
Neutrophils Relative %: 81 %
Platelets: 192 10*3/uL (ref 150–400)
RBC: 3.66 MIL/uL — ABNORMAL LOW (ref 3.87–5.11)
RDW: 14.8 % (ref 11.5–15.5)
WBC: 13.5 10*3/uL — ABNORMAL HIGH (ref 4.0–10.5)
nRBC: 0 % (ref 0.0–0.2)

## 2021-06-26 LAB — COMPREHENSIVE METABOLIC PANEL
ALT: 12 U/L (ref 0–44)
AST: 31 U/L (ref 15–41)
Albumin: 3.1 g/dL — ABNORMAL LOW (ref 3.5–5.0)
Alkaline Phosphatase: 47 U/L (ref 38–126)
Anion gap: 7 (ref 5–15)
BUN: 22 mg/dL (ref 8–23)
CO2: 26 mmol/L (ref 22–32)
Calcium: 8.8 mg/dL — ABNORMAL LOW (ref 8.9–10.3)
Chloride: 100 mmol/L (ref 98–111)
Creatinine, Ser: 0.95 mg/dL (ref 0.44–1.00)
GFR, Estimated: 59 mL/min — ABNORMAL LOW (ref >60)
Glucose, Bld: 161 mg/dL — ABNORMAL HIGH (ref 70–99)
Potassium: 3.8 mmol/L (ref 3.5–5.1)
Sodium: 133 mmol/L — ABNORMAL LOW (ref 135–145)
Total Bilirubin: 2.2 mg/dL — ABNORMAL HIGH (ref 0.3–1.2)
Total Protein: 6.6 g/dL (ref 6.5–8.1)

## 2021-06-26 LAB — LACTIC ACID, PLASMA
Lactic Acid, Venous: 1.9 mmol/L (ref 0.5–1.9)
Lactic Acid, Venous: 0.9 mmol/L (ref 0.5–1.9)

## 2021-06-26 LAB — PROTIME-INR
INR: 2.3 — ABNORMAL HIGH (ref 0.8–1.2)
Prothrombin Time: 25.5 seconds — ABNORMAL HIGH (ref 11.4–15.2)

## 2021-06-26 LAB — RESP PANEL BY RT-PCR (FLU A&B, COVID) ARPGX2
Influenza A by PCR: NEGATIVE
Influenza B by PCR: NEGATIVE
SARS Coronavirus 2 by RT PCR: NEGATIVE

## 2021-06-26 LAB — BRAIN NATRIURETIC PEPTIDE: B Natriuretic Peptide: 103.8 pg/mL — ABNORMAL HIGH (ref 0.0–100.0)

## 2021-06-26 LAB — APTT: aPTT: 43 seconds — ABNORMAL HIGH (ref 24–36)

## 2021-06-26 MED ORDER — ONDANSETRON HCL 4 MG PO TABS: 4.0000 mg | ORAL_TABLET | Freq: Four times a day (QID) | ORAL | Status: DC | PRN

## 2021-06-26 MED ORDER — SODIUM CHLORIDE 0.9% FLUSH: 3.0000 mL | INTRAVENOUS | Status: DC | PRN

## 2021-06-26 MED ORDER — METHYLPREDNISOLONE SODIUM SUCC 125 MG IJ SOLR
125.0000 mg | Freq: Once | INTRAMUSCULAR | Status: AC
  Administered 2021-06-26: 125 mg via INTRAVENOUS
  Filled 2021-06-26: qty 2

## 2021-06-26 MED ORDER — MENTHOL 3 MG MT LOZG
1.0000 | LOZENGE | OROMUCOSAL | Status: DC | PRN
  Administered 2021-06-27 – 2021-06-28 (×3): 3 mg via ORAL
  Filled 2021-06-26 (×3): qty 9

## 2021-06-26 MED ORDER — LACTATED RINGERS IV SOLN: INTRAVENOUS | Status: DC

## 2021-06-26 MED ORDER — VANCOMYCIN HCL 2000 MG/400ML IV SOLN
2000.0000 mg | Freq: Once | INTRAVENOUS | Status: AC
  Administered 2021-06-26: 2000 mg via INTRAVENOUS
  Filled 2021-06-26: qty 400

## 2021-06-26 MED ORDER — ESCITALOPRAM OXALATE 10 MG PO TABS
10.0000 mg | ORAL_TABLET | Freq: Every day | ORAL | Status: DC
  Administered 2021-06-27 – 2021-07-01 (×5): 10 mg via ORAL
  Filled 2021-06-26 (×5): qty 1

## 2021-06-26 MED ORDER — ONDANSETRON HCL 4 MG/2ML IJ SOLN: 4.0000 mg | Freq: Four times a day (QID) | INTRAMUSCULAR | Status: DC | PRN

## 2021-06-26 MED ORDER — SODIUM CHLORIDE 0.9 % IV SOLN: 250.0000 mL | INTRAVENOUS | Status: DC | PRN

## 2021-06-26 MED ORDER — METOPROLOL SUCCINATE ER 25 MG PO TB24: 12.5000 mg | ORAL_TABLET | Freq: Every day | ORAL | Status: DC

## 2021-06-26 MED ORDER — TRELEGY ELLIPTA 200-62.5-25 MCG/ACT IN AEPB: 1.0000 | INHALATION_SPRAY | Freq: Every day | RESPIRATORY_TRACT | 6 refills | Status: DC

## 2021-06-26 MED ORDER — LEVOTHYROXINE SODIUM 75 MCG PO TABS
75.0000 ug | ORAL_TABLET | Freq: Every day | ORAL | Status: DC
  Administered 2021-06-27 – 2021-07-01 (×5): 75 ug via ORAL
  Filled 2021-06-26 (×5): qty 1

## 2021-06-26 MED ORDER — RIVAROXABAN 20 MG PO TABS
20.0000 mg | ORAL_TABLET | Freq: Every day | ORAL | Status: DC
  Administered 2021-06-27 – 2021-06-30 (×5): 20 mg via ORAL
  Filled 2021-06-26 (×5): qty 1

## 2021-06-26 MED ORDER — VANCOMYCIN HCL 1750 MG/350ML IV SOLN
1750.0000 mg | INTRAVENOUS | Status: DC
  Administered 2021-06-28: 1750 mg via INTRAVENOUS
  Filled 2021-06-26: qty 350

## 2021-06-26 MED ORDER — RIVAROXABAN 20 MG PO TABS: 20.0000 mg | ORAL_TABLET | Freq: Every day | ORAL | Status: DC

## 2021-06-26 MED ORDER — SODIUM CHLORIDE 0.9 % IV SOLN
2.0000 g | Freq: Once | INTRAVENOUS | Status: AC
Start: 2021-06-26 — End: 2021-06-26
  Administered 2021-06-26: 2 g via INTRAVENOUS
  Filled 2021-06-26 (×2): qty 2

## 2021-06-26 MED ORDER — IPRATROPIUM-ALBUTEROL 0.5-2.5 (3) MG/3ML IN SOLN
3.0000 mL | Freq: Once | RESPIRATORY_TRACT | Status: AC
  Administered 2021-06-26: 3 mL via RESPIRATORY_TRACT
  Filled 2021-06-26: qty 3

## 2021-06-26 MED ORDER — SODIUM CHLORIDE 0.9% FLUSH
3.0000 mL | Freq: Two times a day (BID) | INTRAVENOUS | Status: DC
  Administered 2021-06-27 – 2021-07-01 (×10): 3 mL via INTRAVENOUS

## 2021-06-26 MED ORDER — LETROZOLE 2.5 MG PO TABS: 2.5000 mg | ORAL_TABLET | Freq: Every day | ORAL | Status: DC

## 2021-06-26 MED ORDER — METOPROLOL SUCCINATE ER 25 MG PO TB24
12.5000 mg | ORAL_TABLET | Freq: Every day | ORAL | Status: DC
  Administered 2021-06-27 – 2021-06-30 (×5): 12.5 mg via ORAL
  Filled 2021-06-26: qty 1
  Filled 2021-06-26: qty 0.5
  Filled 2021-06-26 (×3): qty 1

## 2021-06-26 MED ORDER — ALBUTEROL SULFATE (2.5 MG/3ML) 0.083% IN NEBU: 2.5000 mg | INHALATION_SOLUTION | Freq: Once | RESPIRATORY_TRACT | Status: DC

## 2021-06-26 MED ORDER — GABAPENTIN 300 MG PO CAPS
300.0000 mg | ORAL_CAPSULE | Freq: Every day | ORAL | Status: DC
  Administered 2021-06-27 – 2021-06-30 (×5): 300 mg via ORAL
  Filled 2021-06-26 (×5): qty 1

## 2021-06-26 MED ORDER — ALPRAZOLAM 0.5 MG PO TABS
0.5000 mg | ORAL_TABLET | Freq: Two times a day (BID) | ORAL | Status: DC | PRN
  Administered 2021-06-27 – 2021-07-01 (×9): 0.5 mg via ORAL
  Filled 2021-06-26 (×10): qty 1

## 2021-06-26 MED ORDER — PREDNISONE 10 MG PO TABS: ORAL_TABLET | ORAL | 0 refills | Status: DC

## 2021-06-26 NOTE — ED Triage Notes (Signed)
"  Went to PCP today for follow up from discharge last Friday from hospitalization for PNA, repeat chest x ray showed PNA was not resolved. She went home on 3L oxygen via [place], when she got to PCP today her sats were in the 70s and she had shortness of breath with exertion" per EMS

## 2021-06-26 NOTE — Assessment & Plan Note (Addendum)
Patient initially appeared stable; Quickly decompensated after minimal exertion and was ill appearing in mild respiratory distress. She has failed outpatient treatment at this point. Recent fevers up to 102 F despite being on day 4 of second course of abx. CXR with persistent multifocal pna. Difficulties maintaining saturations in office; desats to 70's on 3 lpm with minimal activity on forehead and finger probe. EMS contacted for transport. Dr. Lamonte Sakai in office and agrees with plan.

## 2021-06-26 NOTE — Assessment & Plan Note (Deleted)
Followed by Dr. Isidoro Donning. They were told in the hospital to not wear CPAP at night and only use oxygen. Advised that she should continue her CPAP at night - we will obtain ONO on CPAP for oxygen requirement eval.

## 2021-06-26 NOTE — Progress Notes (Signed)
$'@Patient'd$  ID: [name], female    DOB: 02/23/1936, 86 y.o.   MRN: [id number]  Chief Complaint  Patient presents with   Follow-up    Post-Hosp.-sob, cough-grey,blood, wheezing, sats. Low at home with moving,temp.elevated x 2 in last 2 weeks    Referring provider: Biagio Borg, MD  HPI: 86 year old female, former remote smoker followed for asthma, mixed obstructive and restrictive airway disease, and allergic rhinitis. She is a patient of Dr Agustina Caroli and last seen in office on 01/13/2020. Past medical history significant for OSA on CPAP (followed by Dr. Isidoro Donning), CAD, CHF, HTN, sinus node dysfunction, a fib on Xarelto, dysphagia, hypothyroid, peripheral neuropathy, osteoarthritis, stage IA left breast cancer 2012, HLD, obesity, gout, prediabetes.   TEST/EVENTS:  06/13/2021 echocardiogram: EF 60-65%. LVH. Unable to determine diastolic function. Moderately elevated PASP. Moderately dilated LA and mildly dilated LA.  06/15/2021 CTA chest: satisfactory opacification of the pulmonary arteries with no evidence of PE. Enlargement of pulmonary artery, suggesting pulmonary HTN. Atherosclerosis is present. Multifocal areas of airspace consolidation throughout both lungs, mostly ground-glass appearance. This is concerning for multifocal pna. Stable left renal cyst and 10 mm left renal stone. Small hiatal hernia present.   01/13/2020: OV with Dr. Lamonte Sakai. Persistent SOB. Previously on Symbicort; had stopped, then started on Advair then switched to [facility]. Continued on singulair and Xyzal. Great compliance with CPAP. Overt stridor on exam - asthma and upper airway syndrome. PFTs ordered. CXR ordered to assess for volume overload d/t CHF - no acute process.   06/12/2021-06/16/2021: Hospitalization for acute respiratory failure r/t multifocal pna. They treated her for AECOPD, although she does not have a history of COPD and was likely an asthma exacerbation. Abx finished prior to discharge (Levaquin). IV steroids.  Discharged on O2 3 lpm. RVP negative. Sputum cultures with samples that were not acceptable.   06/26/2021: Today - hospital follow up Patient presents today for hospital follow up for multifocal pna and asthma exacerbation. She was seen by her PCP on Friday and started on doxycyline due to worsening symptoms and fevers up to 102F. She reports that she is still having occasional fevers, as recently as last night with T max 102. She has been having shortness of breath, upon minimal exertion, and notes that her oxygen has dropped into the 70's with ambulation despite 3 lpm O2. She is coughing up primarily gray sputum, and noticed a scant amount of blood in it this morning. Described as pink mixed with gray. She feels fatigued and has some generalized weakness. She has not been wearing her CPAP at night as she was told not to wear it and just to wear her oxygen. She denies orthopnea, PND, chest pain or lower extremity swelling. She continues on her [place] and has not been using her rescue much because it makes her jittery. She was not discharged on any steroids.   Allergies  Allergen Reactions   Crestor [Rosuvastatin Calcium]     Severe liver function problems   Hydrocodone-Acetaminophen Anxiety and Itching   Lidocaine Hcl Other (See Comments)    Novacaine:  Becomes very shaky   Lipitor [Atorvastatin Calcium] Other (See Comments)    Elevated liver function    Procaine Other (See Comments)    shaking   Rosuvastatin Other (See Comments)    Severe liver function problems   Theophylline Other (See Comments)    Becomes very shaky skaking skaking   Vicodin [Hydrocodone-Acetaminophen] Itching    Itching all over   Codeine  Nausea Only, Anxiety and Other (See Comments)    Also complained of dizziness.  Has same problems with oxycodone and hydrocodone Visual Disturbance, Balance Difficulty, Dizzy "Room Spinning; "Swimming" Balance, vision, nausea, dizzy, "swimming", "room spinning" Balance, vision,  nausea, dizzy, "swimming", "room spinning"   Oxycodone-Acetaminophen Nausea Only, Anxiety and Other (See Comments)    Visual Disturbance, Balance Difficulty, Dizzy "Room Spinning; "Swimming"   Propoxyphene Anxiety, Nausea Only and Other (See Comments)    Visual Disturbance, Balance Difficulty, Dizzy "Room Spinning; "Swimming" Balance, vision, nausea, dizzy, "swimming", "room spinning" Balance, vision, nausea, dizzy, "swimming, "room spinning"   Quinine Derivatives Nausea Only    dizzy   Sulfa Antibiotics Nausea Only    Also complained of dizziness   Atorvastatin Other (See Comments)    Severe liver function problems   Ephedrine Other (See Comments)    shaking   Other Other (See Comments)    Quinine causes nausea, dizzy   Oxycodone Other (See Comments)    Balance, vision, nausea, dizzy, "swimming", "room spinning"   Quinine Other (See Comments)   Cephalexin Rash and Other (See Comments)   Epinephrine Other (See Comments)    Patient becomes "shaky" shaking shaking   Penicillins Rash    Pt reported all over body rash in 1958 Has patient had a PCN reaction causing immediate rash, facial/tongue/throat swelling, SOB or lightheadedness with hypotension:Yes Has patient had a PCN reaction causing severe rash involving mucus membranes or skin necrosis: No Has patient had a PCN reaction that required hospitalization: No Has patient had a PCN reaction occurring within the last 10 years:No     Theophyllines Other (See Comments)    Patient becomes "shaky"    Immunization History  Administered Date(s) Administered   Fluad Quad(high Dose 65+) 03/30/2019, 01/21/2020, 04/24/2021   Influenza Split 04/06/2009, 04/05/2010, 01/10/2011, 02/10/2012, 04/23/2013   Influenza, High Dose Seasonal PF 03/20/2017, 01/19/2018   Influenza-Unspecified 05/08/2015, 01/23/2016   PFIZER(Purple Top)SARS-COV-2 Vaccination 06/11/2019, 07/06/2019, 02/28/2020   Pneumococcal Conjugate-13 06/01/2013, 03/20/2017    Pneumococcal Polysaccharide-23 03/16/2003, 01/23/2016   Td 12/08/2009    Past Medical History:  Diagnosis Date   Allergy    Anxiety    Arthritis    no cartlidge in knees   Asthma    Bradycardia    a. atenolol stopped due to this, HR 40s.   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  CAD s/p RCA stent 2000 with residual mild-mod disease of left system 2001.   Chronic atrial fibrillation (HCC)    COPD (chronic obstructive pulmonary disease) ([place])    Essential hypertension 03/24/2015   Gout    post operative   Hx of radiation therapy 04/02/11 -05/20/11   left breast   Hyperlipidemia    Hypothyroidism 03/24/2015   Incontinence of urine    Malignant neoplasm of upper-outer quadrant of female breast ([place]) 12/26/2010   Osteoporosis 03/24/2015   Peripheral neuropathy 03/24/2015   Plantar fasciitis    Sleep apnea     Tobacco History: Social History   Tobacco Use  Smoking Status Former   Packs/day: 1.00   Years: 4.00   Pack years: 4.00   Types: Cigarettes   Quit date: 12/05/1962   Years since quitting: 58.5  Smokeless Tobacco Never   Counseling given: Not Answered   No facility-administered medications prior to visit.   Outpatient Medications Prior to Visit  Medication Sig Dispense Refill   albuterol (PROVENTIL) (2.5 MG/3ML)  0.083% nebulizer solution Take 3 mLs (2.5 mg total) by nebulization every 6 (six) hours as needed for wheezing or shortness of breath. 75 mL 12   albuterol (VENTOLIN HFA) 108 (90 Base) MCG/ACT inhaler INHALE 2 PUFFS BY MOUTH INTO THE LUNGS IF NEEDED FOR WHEEZING AND OR SHORTNESS OF BREATH 8.5 g 11   ALLERGY RELIEF 180 MG tablet TAKE 1 TABLET BY MOUTH EVERY DAY 90 tablet 1   allopurinol (ZYLOPRIM) 300 MG tablet 1 tablet     ALPRAZolam (XANAX) 0.5 MG tablet TAKE 1 TO 2 TABLETS BY MOUTH TWICE DAILY AS NEEDED FOR ANXIETY OR SLEEP 60 tablet 2   Ascorbic Acid (VITAMIN C) 1000 MG tablet 1 tablet     azelastine  (ASTELIN) 0.1 % nasal spray Place 2 sprays into both nostrils daily. Use in each nostril as directed 30 mL 5   B Complex Vitamins (VITAMIN B COMPLEX PO) Take 1 tablet by mouth daily.     Biotin 5000 MCG CAPS Take 5,000 mcg by mouth daily.      cholecalciferol (VITAMIN D) 1000 UNITS tablet Take 1,000 Units by mouth daily.     co-enzyme Q-10 50 MG capsule Take 400 mg by mouth daily.     colchicine 0.6 MG tablet Take 0.6 mg by mouth daily as needed (flare  up).      Cyanocobalamin (VITAMIN B-12 PO) Take 1 tablet by mouth daily.     cycloSPORINE (RESTASIS) 0.05 % ophthalmic emulsion Apply 1 drop to eye 2 (two) times daily as needed (allergies).     dextromethorphan-guaiFENesin (MUCINEX DM) 30-600 MG 12hr tablet Take 1 tablet by mouth 2 (two) times daily.     doxycycline (VIBRA-TABS) 100 MG tablet Take 1 tablet (100 mg total) by mouth 2 (two) times daily. 20 tablet 0   EPINEPHrine 0.3 mg/0.3 mL IJ SOAJ injection Inject 0.3 mg into the muscle as needed for anaphylaxis.     escitalopram (LEXAPRO) 10 MG tablet TAKE 1 TABLET(10 MG) BY MOUTH DAILY 90 tablet 2   ezetimibe (ZETIA) 10 MG tablet 1/2 tablet     fexofenadine (ALLEGRA) 180 MG tablet 1 tablet as needed     gabapentin (NEURONTIN) 300 MG capsule Take 1 capsule (300 mg total) by mouth 3 (three) times daily. (Patient taking differently: Take 300 mg by mouth at bedtime.) 270 capsule 1   Homeopathic Products (ARNICARE) GEL Apply 1 application topically daily as needed (soreness).     hydrochlorothiazide (HYDRODIURIL) 25 MG tablet TAKE 2 TABLETS(50 MG) BY MOUTH DAILY 180 tablet 1   letrozole (FEMARA) 2.5 MG tablet 1 tablet     levocetirizine (XYZAL) 5 MG tablet Take 1 tablet (5 mg total) by mouth every evening. 30 tablet 11   levothyroxine (SYNTHROID) 75 MCG tablet TAKE 1 TABLET(75 MCG) BY MOUTH DAILY 90 tablet 1   metoprolol succinate (TOPROL-XL) 25 MG 24 hr tablet TAKE 1/2 TABLET(12.5 MG) BY MOUTH DAILY 45 tablet 3   montelukast (SINGULAIR) 10 MG  tablet Take 1 tablet (10 mg total) by mouth at bedtime. (Patient taking differently: Take 10 mg by mouth at bedtime as needed (allergies).) 90 tablet 1   mupirocin ointment (BACTROBAN) 2 % Use twice a day as directed 30 g 1   nitrofurantoin (MACRODANTIN) 50 MG capsule Take 50 mg by mouth 3 (three) times a week. Take three days a week (MWF)     Omega-3 Fatty Acids (FISH OIL) 1000 MG CAPS 1 capsule     ondansetron (ZOFRAN) 4 MG tablet Take  1 tablet (4 mg total) by mouth every 8 (eight) hours as needed for nausea or vomiting. 40 tablet 3   OVER THE COUNTER MEDICATION Dark Cherry 1 twice a day     potassium chloride SA (KLOR-CON M) 20 MEQ tablet TAKE 1 TABLET(20 MEQ) BY MOUTH DAILY 90 tablet 3   Pyridoxine HCl (VITAMIN B-6 PO) Take 1 tablet by mouth daily.     Pringle 420 MG/3.5ML SOCT INJECT 1 UNIT INTO THE SKIN EVERY 30 DAYS 3.5 mL 11   rosuvastatin (CRESTOR) 20 MG tablet Take 20 mg by mouth 2 (two) times a week. Take on Mon & Fri     vitamin C (ASCORBIC ACID) 500 MG tablet Take 500 mg by mouth daily.     VITAMIN E PO Take 1 tablet by mouth daily.     XARELTO 20 MG TABS tablet TAKE 1 TABLET(20 MG) BY MOUTH DAILY WITH SUPPER (Patient taking differently: 20 mg every evening.) 90 tablet 1   Zinc 100 MG TABS 1 tablet     WIXELA INHUB 500-50 MCG/DOSE AEPB Inhale 1 puff into the lungs 2 (two) times daily. 60 each 11   levalbuterol (XOPENEX HFA) 45 MCG/ACT inhaler Inhale 2 puffs into the lungs every 6 (six) hours as needed for wheezing or shortness of breath. (Patient not taking: Reported on 06/26/2021) 3 Inhaler 3   budesonide-formoterol (SYMBICORT) 80-4.5 MCG/ACT inhaler 2 puffs (Patient not taking: Reported on 06/26/2021)       Review of Systems:   Constitutional: No weight loss or gain, night sweats. +fatigue, fevers, chills HEENT: No headaches, difficulty swallowing, tooth/dental problems, or sore throat. No sneezing, itching, ear ache, nasal congestion, or post nasal drip CV:  No  chest pain, orthopnea, PND, swelling in lower extremities, anasarca, dizziness, palpitations, syncope Resp: +shortness of breath with minimal exertion; productive cough; wheezing. No excess mucus or change in color of mucus. No hemoptysis. No chest wall deformity GI:  No heartburn, indigestion, abdominal pain, nausea, vomiting, diarrhea, change in bowel habits, loss of appetite, bloody stools.  GU: No dysuria, change in color of urine, urgency or frequency.  No flank pain, no hematuria  Skin: No rash, lesions, ulcerations MSK:  No joint pain or swelling.  No decreased range of motion.  No back pain. Neuro: No dizziness or lightheadedness.  Psych: No depression or anxiety. Mood stable.     Physical Exam:  BP 118/60 (BP Location: Right Arm, Cuff Size: Large)    Pulse 72    Temp 98.5 F (36.9 C) (Temporal)    Ht 5' 6.5" (1.689 m)    Wt 258 lb (117 kg) Comment: at home weight this am   SpO2 94%    BMI 41.02 kg/m   GEN: Pleasant, interactive, acutely-ill appearing; obese; initially appeared to be stable but after minimal exertion, decompensated and was in mild respiratory distress. HEENT:  Normocephalic and atraumatic. EACs patent bilaterally. TM pearly gray with present light reflex bilaterally. PERRLA. Sclera white. Nasal turbinates pink, moist and patent bilaterally. No rhinorrhea present. Oropharynx pink and moist, without exudate or edema. No lesions, ulcerations, or postnasal drip.  NECK:  Supple w/ fair ROM. No JVD present. Normal carotid impulses w/o bruits. Thyroid symmetrical with no goiter or nodules palpated. No lymphadenopathy.   CV: Irregular rhythm, rate controlled, no m/r/g, no peripheral edema. Pulses intact, +2 bilaterally. No cyanosis, pallor or clubbing. PULMONARY:  Labored breathing, tachypneic with minimal exertion. Scattered wheezes bilaterally A&P. No accessory muscle use.  GI: BS  present and normoactive. Soft, non-tender to palpation. No organomegaly or masses detected. No  CVA tenderness. MSK: No erythema, warmth or tenderness. Cap refil <2 sec all extrem. No deformities or joint swelling noted.  Neuro: A/Ox3. No focal deficits noted.   Skin: Warm, no lesions or rashe Psych: Normal affect and behavior. Judgement and thought content appropriate.     Lab Results:  CBC    Component Value Date/Time   WBC 8.9 06/16/2021 0502   RBC 3.85 (L) 06/16/2021 0502   HGB 12.4 06/16/2021 0502   HGB 13.6 06/27/2016 0839   HCT 38.6 06/16/2021 0502   HCT 40.6 06/27/2016 0839   PLT 204 06/16/2021 0502   PLT 146 06/27/2016 0839   MCV 100.3 (H) 06/16/2021 0502   MCV 98.1 06/27/2016 0839   MCH 32.2 06/16/2021 0502   MCHC 32.1 06/16/2021 0502   RDW 15.0 06/16/2021 0502   RDW 15.0 (H) 06/27/2016 0839   LYMPHSABS 1.2 06/12/2021 1826   LYMPHSABS 1.5 06/27/2016 0839   MONOABS 1.1 (H) 06/12/2021 1826   MONOABS 0.8 06/27/2016 0839   EOSABS 0.1 06/12/2021 1826   EOSABS 0.3 06/27/2016 0839   BASOSABS 0.0 06/12/2021 1826   BASOSABS 0.1 06/27/2016 0839    BMET    Component Value Date/Time   NA 141 06/16/2021 0502   NA 147 (H) 06/06/2021 1607   NA 143 06/27/2016 0839   K 3.6 06/16/2021 0502   K 4.0 06/27/2016 0839   CL 105 06/16/2021 0502   CL 105 07/15/2012 1409   CO2 30 06/16/2021 0502   CO2 28 06/27/2016 0839   GLUCOSE 94 06/16/2021 0502   GLUCOSE 109 06/27/2016 0839   GLUCOSE 117 (H) 07/15/2012 1409   BUN 20 06/16/2021 0502   BUN 14 06/06/2021 1607   BUN 17.3 06/27/2016 0839   CREATININE 0.69 06/16/2021 0502   CREATININE 0.83 11/24/2019 0958   CREATININE 0.8 06/27/2016 0839   CALCIUM 9.4 06/16/2021 0502   CALCIUM 9.7 06/27/2016 0839   GFRNONAA >60 06/16/2021 0502   GFRNONAA 66 11/24/2019 0958   GFRAA 66 12/28/2019 1549   GFRAA 76 11/24/2019 0958    BNP    Component Value Date/Time   BNP 143.5 (H) 06/12/2021 1826   BNP 72 11/15/2019 1304     Imaging:  06/26/2021: CXR reviewed by me. Persistent airspace opacities, concerning for multifocal  pneumonia. Primarily b/l upper lobes and some in b/l lower lobes  DG Chest 2 View  Result Date: 06/26/2021 CLINICAL DATA:  Hypoxia, shortness of breath EXAM: CHEST - 2 VIEW COMPARISON:  06/15/2021 FINDINGS: Bilateral upper lobe and to lesser extent bilateral lower lobe airspace disease concerning for multilobar pneumonia including atypical etiologies. No pleural effusion or pneumothorax. Stable cardiomegaly. No acute osseous abnormality. Osteoarthritis of bilateral glenohumeral joints. IMPRESSION: 1. Bilateral upper lobe and to lesser extent bilateral lower lobe airspace disease concerning for multilobar pneumonia including atypical etiologies. Electronically Signed   By: Kathreen Devoid M.D.   On: 06/26/2021 10:50   DG Chest 2 View  Result Date: 06/12/2021 CLINICAL DATA:  Cough, shortness of breath. EXAM: CHEST - 2 VIEW COMPARISON:  May 17, 2021. FINDINGS: Stable cardiomegaly is noted. Mild bibasilar subsegmental atelectasis is noted. Bony thorax is unremarkable. IMPRESSION: Mild bibasilar subsegmental atelectasis. Electronically Signed   By: Marijo Conception M.D.   On: 06/12/2021 13:39   CT Angio Chest Pulmonary Embolism (PE) W or WO Contrast  Result Date: 06/15/2021 CLINICAL DATA:  Chest pain, shortness of breath, fever, hypoxia  EXAM: CT ANGIOGRAPHY CHEST WITH CONTRAST TECHNIQUE: Multidetector CT imaging of the chest was performed using the standard protocol during bolus administration of intravenous contrast. Multiplanar CT image reconstructions and MIPs were obtained to evaluate the vascular anatomy. RADIATION DOSE REDUCTION: This exam was performed according to the departmental dose-optimization program which includes automated exposure control, adjustment of the mA and/or kV according to patient size and/or use of iterative reconstruction technique. CONTRAST:  56mL OMNIPAQUE IOHEXOL 350 MG/ML SOLN COMPARISON:  CT 03/27/2015.  X-ray 06/12/2021 FINDINGS: Cardiovascular: Satisfactory opacification of  the pulmonary arteries. Respiratory motion artifact degrades evaluation of the more distal pulmonary arterial branches, particularly within the lung bases. No filling defect to the segmental branch level to suggest pulmonary embolism. Main pulmonary trunk measures 3.5 cm in diameter. Thoracic aorta is nonaneurysmal. Atherosclerotic calcifications of the aorta and coronary arteries. Heart size is mildly enlarged. No pericardial effusion. Mediastinum/Nodes: No pathologically enlarged axillary, mediastinal, or hilar lymph nodes. Trachea and esophagus within normal limits. Small sliding-type hiatal hernia. Lungs/Pleura: Multifocal areas of airspace consolidation throughout both lungs, much of which is ground-glass. Findings are most confluent within the left upper lobe anteriorly. No pleural effusion or pneumothorax. Upper Abdomen: Grossly stable appearance of a left renal cyst. 10 mm left renal stone. Musculoskeletal: Severe osteoarthritis of the right glenohumeral joint with multiple intra-articular loose bodies. Osseous structures appear demineralized. No acute bony findings. Review of the MIP images confirms the above findings. IMPRESSION: 1. Exam is slightly degraded by respiratory motion artifact. No pulmonary embolism is identified within the limitations of this exam. 2. Multifocal areas of airspace consolidation throughout both lungs, most confluent within the left upper lobe anteriorly, suggestive of multifocal pneumonia. 3. Left-sided nephrolithiasis. 4. Small hiatal hernia. 5. Aortic and coronary artery atherosclerosis (ICD10-I70.0). Electronically Signed   By: Davina Poke D.O.   On: 06/15/2021 13:57   ECHOCARDIOGRAM COMPLETE  Result Date: 06/13/2021    ECHOCARDIOGRAM REPORT   Patient Name:   [name] Date of Exam: 06/13/2021 Medical Rec #:  [id number]   Height:       66.5 in Accession #:    [id number]  Weight:       261.0 lb Date of Birth:  01/06/1936   BSA:          2.252 m Patient Age:    12 years     BP:           148/106 mmHg Patient Gender: F           HR:           90 bpm. Exam Location:  Inpatient Procedure: 2D Echo, 3D Echo, Cardiac Doppler and Color Doppler Indications:    Elevated Troponin  History:        Patient has prior history of Echocardiogram examinations, most                 recent 12/10/2019. CAD, COPD, Arrythmias:Bradycardia and Atrial                 Fibrillation; Risk Factors:Hypertension, Sleep Apnea and Former                 Smoker. GOUT. Past history of breast cancer.  Sonographer:    Darlina Sicilian RDCS Referring Phys: [phone] A CALDWELL POWELL [place]  1. Left ventricular ejection fraction, by estimation, is 60 to 65%. The left ventricle has normal function. The left ventricle has no regional wall motion abnormalities. There is mild concentric left ventricular hypertrophy.  Left ventricular diastolic parameters are indeterminate.  2. Right ventricular systolic function is normal. The right ventricular size is normal. There is moderately elevated pulmonary artery systolic pressure.  3. Left atrial size was moderately dilated.  4. Right atrial size was mildly dilated.  5. No evidence of mitral valve regurgitation.  6. There is mild calcification of the aortic valve. Aortic valve regurgitation is not visualized.  7. The inferior vena cava is dilated in size with <50% respiratory variability, suggesting right atrial pressure of 15 mmHg. Conclusion(s)/Recommendation(s): Compared to prior echo RA pressure increased. FINDINGS  Left Ventricle: Left ventricular ejection fraction, by estimation, is 60 to 65%. The left ventricle has normal function. The left ventricle has no regional wall motion abnormalities. The left ventricular internal cavity size was normal in size. There is  mild concentric left ventricular hypertrophy. Left ventricular diastolic parameters are indeterminate. Right Ventricle: The right ventricular size is normal. No increase in right ventricular wall thickness. Right  ventricular systolic function is normal. There is moderately elevated pulmonary artery systolic pressure. The tricuspid regurgitant velocity is 3.05 m/s, and with an assumed right atrial pressure of 15 mmHg, the estimated right ventricular systolic pressure is 35.5 mmHg. Left Atrium: Left atrial size was moderately dilated. Right Atrium: Right atrial size was mildly dilated. Pericardium: There is no evidence of pericardial effusion. Mitral Valve: No evidence of mitral valve regurgitation. Tricuspid Valve: Tricuspid valve regurgitation is mild. Aortic Valve: There is mild calcification of the aortic valve. Aortic valve regurgitation is not visualized. Pulmonic Valve: The pulmonic valve was grossly normal. Pulmonic valve regurgitation is mild. Aorta: The aortic root and ascending aorta are structurally normal, with no evidence of dilitation. Venous: The inferior vena cava is dilated in size with less than 50% respiratory variability, suggesting right atrial pressure of 15 mmHg. IAS/Shunts: The interatrial septum was not well visualized.  LEFT VENTRICLE PLAX 2D LVIDd:         4.15 cm   Diastology LV PW:         1.20 cm   LV e' medial:    9.96 cm/s LV IVS:        1.20 cm   LV E/e' medial:  14.5 LVOT diam:     2.20 cm   LV e' lateral:   14.25 cm/s LV SV:         63        LV E/e' lateral: 10.2 LV SV Index:   28 LVOT Area:     3.80 cm                           3D Volume EF:                          3D EF:        57 %                          LV EDV:       116 ml                          LV ESV:       50 ml                          LV SV:  66 ml RIGHT VENTRICLE RV S prime:     12.50 cm/s TAPSE (M-mode): 1.6 cm LEFT ATRIUM             Index        RIGHT ATRIUM           Index LA diam:        4.40 cm 1.95 cm/m   RA Area:     24.50 cm LA Vol (A2C):   79.8 ml 35.44 ml/m  RA Volume:   65.60 ml  29.13 ml/m LA Vol (A4C):   92.6 ml 41.12 ml/m LA Biplane Vol: 90.5 ml 40.19 ml/m  AORTIC VALVE LVOT Vmax:   81.70 cm/s  LVOT Vmean:  56.500 cm/s LVOT VTI:    0.165 m  AORTA Ao Root diam: 3.60 cm Ao Asc diam:  3.00 cm MITRAL VALVE                TRICUSPID VALVE MV Area (PHT):  4.64 cm    TR Peak grad:   37.2 mmHg MV Area (plan): 7.34 cm    TR Vmax:        305.00 cm/s MV Decel Time:  163 msec MV E velocity: 144.67 cm/s  SHUNTS                             Systemic VTI:  0.16 m                             Systemic Diam: 2.20 cm Phineas Inches Electronically signed by Phineas Inches Signature Date/Time: 06/13/2021/5:14:36 PM    Final       PFT Results Latest Ref Rng & Units 02/06/2017  FVC-Pre L 2.19  FVC-Predicted Pre % 74  FVC-Post L 2.15  FVC-Predicted Post % 73  Pre FEV1/FVC % % 76  Post FEV1/FCV % % 81  FEV1-Pre L 1.67  FEV1-Predicted Pre % 75  FEV1-Post L 1.75  DLCO uncorrected ml/min/mmHg 20.72  DLCO [facility]% % 75  DLCO corrected ml/min/mmHg 19.57  DLCO COR %Predicted % 70  DLVA Predicted % 98  TLC L 4.55  TLC % Predicted % 83  RV % Predicted % 85    No results found for: NITRICOXIDE      Assessment & Plan:   Multifocal pneumonia Patient initially appeared stable; Quickly decompensated after minimal exertion and was ill appearing in mild respiratory distress. She has failed outpatient treatment at this point. Recent fevers up to 102 F despite being on day 4 of second course of abx. CXR with persistent multifocal pna. Difficulties maintaining saturations in office; desats to 70's on 3 lpm with minimal activity on forehead and finger probe. EMS contacted for transport. Dr. Lamonte Sakai in office and agrees with plan.   Asthma exacerbation Worsening symptoms with increased SOB and wheezing. Flare likely related to persistent multifocal pna.Albuterol neb in office.   Acute respiratory failure with hypoxia (HCC) Was discharged on 3 lpm. Has had desaturations as low as 60's at home; occasionally in 70's. Today with desaturations into 70's with minimal exertion on 3 lpm. Increased to 6 lpm [place] and 90% - transitioned to  non rebreather mask. EMS contacted for transport to [facility].  CHF (congestive heart failure) ([place]) Appears compensated on exam. Recent echo with normal function. Seen by cards post hospitalization with no changes. Follow up with cardiology as scheduled.    90 min spent  on case, >50% face to face time  Clayton Bibles, NP 06/26/2021  Pt aware and understands NP's role.

## 2021-06-26 NOTE — H&P (Signed)
TRH H&P    Patient Demographics:    [name], is a 86 y.o. female  MRN: [id number]  DOB - [birth year]  Admit Date - 06/26/2021    Outpatient Primary MD for the patient is Biagio Borg, MD  Patient coming from: Pulmonology clinic  Chief complaint-shortness of breath   HPI:    [name]  is a 86 y.o. female, with history of asthma, COPD, OSA on CPAP, CAD, CHF, hypertension, sinus node dysfunction, atrial fibrillation on Xarelto, dysphagia, hypothyroidism, peripheral neuropathy, osteoarthritis, stage Ia left breast cancer 2012, hyperlipidemia, obesity, gout, prediabetes who was recently hospitalized for multifocal pneumonia and asthma exacerbation.  Patient was seen by her PCP as outpatient on Friday and started on doxycycline due to worsening symptoms with fever of up to 102 F.  She has been having shortness of breath with minimal exertion and noted that O2 sats was dropping to 70s on ambulation despite being on 3 L/min of oxygen.  Also has been coughing scant amount of phlegm. Chest x-ray done at the pulmonology clinic showed persistent airspace opacities concerning for multifocal pneumonia.  Primarily bilateral upper lobes and in bilateral lower lobes. She was sent to the ED for admission and IV antibiotics. Denies nausea vomiting or diarrhea Denies chest pain Denies abdominal pain Denies dysuria or urgency of urination     Review of systems:    In addition to the HPI above,    All other systems reviewed and are negative.    Past History of the following :    Past Medical History:  Diagnosis Date   Allergy    Anxiety    Arthritis    no cartlidge in knees   Asthma    Bradycardia    a. atenolol stopped due to this, HR 40s.   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  CAD s/p RCA stent 2000 with residual mild-mod disease of left system 2001.    Chronic atrial fibrillation (HCC)    COPD (chronic obstructive pulmonary disease) ([place])    Essential hypertension 03/24/2015   Gout    post operative   Hx of radiation therapy 04/02/11 -05/20/11   left breast   Hyperlipidemia    Hypothyroidism 03/24/2015   Incontinence of urine    Malignant neoplasm of upper-outer quadrant of female breast ([place]) 12/26/2010   Osteoporosis 03/24/2015   Peripheral neuropathy 03/24/2015   Plantar fasciitis    Sleep apnea       Past Surgical History:  Procedure Laterality Date   BREAST LUMPECTOMY W/ NEEDLE LOCALIZATION  01/29/2011   Left with [facility] Dr Margot Chimes   CHOLECYSTECTOMY  1955   CORONARY ANGIOPLASTY WITH STENT PLACEMENT  2000   DILATION AND CURETTAGE OF UTERUS  1976   and 1996   [facility]   tendons between thumb and forefinger   SKIN BIOPSY     1994, 2006, 2008, 2010, 2011, 2011, 2012-  pre-cancerous   TONSILLECTOMY  1955  Social History:      Social History   Tobacco Use   Smoking status: Former    Packs/day: 1.00    Years: 4.00    Pack years: 4.00    Types: Cigarettes    Quit date: 12/05/1962    Years since quitting: 58.5   Smokeless tobacco: Never  Substance Use Topics   Alcohol use: No       Family History :     Family History  Problem Relation Age of Onset   Heart disease Father    Cancer Father        intestinal polyps   Heart attack Father    Asthma Sister    Heart disease Brother    Heart attack Brother       Home Medications:   Prior to Admission medications   Medication Sig Start Date End Date Taking? Authorizing Provider  albuterol (PROVENTIL) (2.5 MG/3ML) 0.083% nebulizer solution Take 3 mLs (2.5 mg total) by nebulization every 6 (six) hours as needed for wheezing or shortness of breath. 09/17/16   Biagio Borg, MD  albuterol (VENTOLIN HFA) 108 (90 Base) MCG/ACT inhaler INHALE 2 PUFFS BY MOUTH INTO THE LUNGS IF NEEDED FOR WHEEZING AND OR SHORTNESS OF BREATH 05/29/21   Biagio Borg, MD   ALLERGY RELIEF 180 MG tablet TAKE 1 TABLET BY MOUTH EVERY DAY 07/14/20   Biagio Borg, MD  allopurinol (ZYLOPRIM) 300 MG tablet 1 tablet    [provider]  ALPRAZolam Duanne Moron) 0.5 MG tablet TAKE 1 TO 2 TABLETS BY MOUTH TWICE DAILY AS NEEDED FOR ANXIETY OR SLEEP 04/25/21   Biagio Borg, MD  Ascorbic Acid (VITAMIN C) 1000 MG tablet 1 tablet    [provider]  azelastine (ASTELIN) 0.1 % nasal spray Place 2 sprays into both nostrils daily. Use in each nostril as directed 07/14/19   Biagio Borg, MD  B Complex Vitamins (VITAMIN B COMPLEX PO) Take 1 tablet by mouth daily.    [provider]  Biotin 5000 MCG CAPS Take 5,000 mcg by mouth daily.     [provider]  cholecalciferol (VITAMIN D) 1000 UNITS tablet Take 1,000 Units by mouth daily.    [provider]  co-enzyme Q-10 50 MG capsule Take 400 mg by mouth daily.    [provider]  colchicine 0.6 MG tablet Take 0.6 mg by mouth daily as needed (flare  up).     [provider]  Cyanocobalamin (VITAMIN B-12 PO) Take 1 tablet by mouth daily.    [provider]  cycloSPORINE (RESTASIS) 0.05 % ophthalmic emulsion Apply 1 drop to eye 2 (two) times daily as needed (allergies).    [provider]  dextromethorphan-guaiFENesin (MUCINEX DM) 30-600 MG 12hr tablet Take 1 tablet by mouth 2 (two) times daily.    [provider]  doxycycline (VIBRA-TABS) 100 MG tablet Take 1 tablet (100 mg total) by mouth 2 (two) times daily. 06/22/21   Biagio Borg, MD  EPINEPHrine 0.3 mg/0.3 mL IJ SOAJ injection Inject 0.3 mg into the muscle as needed for anaphylaxis. 12/29/19   [provider]  escitalopram (LEXAPRO) 10 MG tablet TAKE 1 TABLET(10 MG) BY MOUTH DAILY 02/05/21   Biagio Borg, MD  ezetimibe (ZETIA) 10 MG tablet 1/2 tablet    [provider]  fexofenadine (ALLEGRA) 180 MG tablet 1 tablet as needed    [provider]  gabapentin (NEURONTIN) 300 MG  capsule Take 1 capsule (300  mg total) by mouth 3 (three) times daily. Patient taking differently: Take 300 mg by mouth at bedtime. 12/17/18   Biagio Borg, MD  Homeopathic Products (ARNICARE) GEL Apply 1 application topically daily as needed (soreness).    [provider]  hydrochlorothiazide (HYDRODIURIL) 25 MG tablet TAKE 2 TABLETS(50 MG) BY MOUTH DAILY 01/17/21   Biagio Borg, MD  letrozole [facility]) 2.5 MG tablet 1 tablet    [provider]  levalbuterol (XOPENEX HFA) 45 MCG/ACT inhaler Inhale 2 puffs into the lungs every 6 (six) hours as needed for wheezing or shortness of breath. Patient not taking: Reported on 06/26/2021 09/30/19   Biagio Borg, MD  levocetirizine (XYZAL) 5 MG tablet Take 1 tablet (5 mg total) by mouth every evening. 06/24/19   Biagio Borg, MD  levothyroxine (SYNTHROID) 75 MCG tablet TAKE 1 TABLET(75 MCG) BY MOUTH DAILY 06/13/21   Biagio Borg, MD  metoprolol succinate (TOPROL-XL) 25 MG 24 hr tablet TAKE 1/2 TABLET(12.5 MG) BY MOUTH DAILY 09/27/20   Jettie Booze, MD  montelukast (SINGULAIR) 10 MG tablet Take 1 tablet (10 mg total) by mouth at bedtime. Patient taking differently: Take 10 mg by mouth at bedtime as needed (allergies). 07/19/19   Biagio Borg, MD  mupirocin ointment Drue Stager) 2 % Use twice a day as directed 02/27/18   Marrian Salvage, FNP  nitrofurantoin (MACRODANTIN) 50 MG capsule Take 50 mg by mouth 3 (three) times a week. Take three days a week (MWF)    [provider]  Omega-3 Fatty Acids (FISH OIL) 1000 MG CAPS 1 capsule    [provider]  ondansetron (ZOFRAN) 4 MG tablet Take 1 tablet (4 mg total) by mouth every 8 (eight) hours as needed for nausea or vomiting. 08/30/20   Biagio Borg, MD  [place] 1 twice a day    [provider]  potassium chloride SA (KLOR-CON M) 20 MEQ tablet TAKE 1 TABLET(20 MEQ) BY MOUTH DAILY 04/24/21   Biagio Borg, MD  Pyridoxine HCl  (VITAMIN B-6 PO) Take 1 tablet by mouth daily.    [provider]  [place] 420 MG/3.5ML SOCT INJECT 1 UNIT INTO THE SKIN EVERY 30 DAYS 06/11/21   Jettie Booze, MD  rosuvastatin (CRESTOR) 20 MG tablet Take 20 mg by mouth 2 (two) times a week. Take on Mon & Fri 06/05/20   [provider]  vitamin C (ASCORBIC ACID) 500 MG tablet Take 500 mg by mouth daily.    [provider]  VITAMIN E PO Take 1 tablet by mouth daily.    [provider]  XARELTO 20 MG TABS tablet TAKE 1 TABLET(20 MG) BY MOUTH DAILY WITH SUPPER Patient taking differently: 20 mg every evening. 05/14/21   Jettie Booze, MD  Zinc 100 MG TABS 1 tablet    [provider]     Allergies:     Allergies  Allergen Reactions   Crestor [Rosuvastatin Calcium]     Severe liver function problems   Hydrocodone-Acetaminophen Anxiety and Itching   Lidocaine Hcl Other (See Comments)    Novacaine:  Becomes very shaky   Lipitor [Atorvastatin Calcium] Other (See Comments)    Elevated liver function    Procaine Other (See Comments)    shaking   Rosuvastatin Other (See Comments)    Severe liver function problems   Theophylline Other (See Comments)    Becomes very shaky skaking skaking  Vicodin [Hydrocodone-Acetaminophen] Itching    Itching all over   Codeine Nausea Only, Anxiety and Other (See Comments)    Also complained of dizziness.  Has same problems with oxycodone and hydrocodone Visual Disturbance, Balance Difficulty, Dizzy "Room Spinning; "Swimming" Balance, vision, nausea, dizzy, "swimming", "room spinning" Balance, vision, nausea, dizzy, "swimming", "room spinning"   Oxycodone-Acetaminophen Nausea Only, Anxiety and Other (See Comments)    Visual Disturbance, Balance Difficulty, Dizzy "Room Spinning; "Swimming"   Propoxyphene Anxiety, Nausea Only and Other (See Comments)    Visual Disturbance, Balance Difficulty, Dizzy "Room Spinning; "Swimming" Balance,  vision, nausea, dizzy, "swimming", "room spinning" Balance, vision, nausea, dizzy, "swimming, "room spinning"   Quinine Derivatives Nausea Only    dizzy   Sulfa Antibiotics Nausea Only    Also complained of dizziness   Atorvastatin Other (See Comments)    Severe liver function problems   Ephedrine Other (See Comments)    shaking   Other Other (See Comments)    Quinine causes nausea, dizzy   Oxycodone Other (See Comments)    Balance, vision, nausea, dizzy, "swimming", "room spinning"   Quinine Other (See Comments)   Cephalexin Rash and Other (See Comments)   Epinephrine Other (See Comments)    Patient becomes "shaky" shaking shaking   Penicillins Rash    Pt reported all over body rash in 1958 Has patient had a PCN reaction causing immediate rash, facial/tongue/throat swelling, SOB or lightheadedness with hypotension:Yes Has patient had a PCN reaction causing severe rash involving mucus membranes or skin necrosis: No Has patient had a PCN reaction that required hospitalization: No Has patient had a PCN reaction occurring within the last 10 years:No     Theophyllines Other (See Comments)    Patient becomes "shaky"     Physical Exam:   Vitals  Blood pressure 139/73, pulse 83, temperature 99 F (37.2 C), temperature source Oral, resp. rate (!) 24, height 5' 6.5" (1.689 m), weight 117.5 kg, SpO2 99 %.  1.  General: Appears in no acute distress  2. Psychiatric: Alert, oriented x3, intact insight and judgment  3. Neurologic: Cranial nerves II through XII grossly intact, no focal deficit noted  4. HEENMT:  Atraumatic normocephalic, extraocular muscles are intact  5. Respiratory : Bilateral rhonchi auscultated at lung bases  6. Cardiovascular : S1-S2, regular, no murmur auscultated, no edema in the lower extremities  7. Gastrointestinal:  Abdomen is soft, nontender, no organomegaly  8. Skin:  No rashes noted      Data Review:    CBC Recent Labs  Lab  06/26/21 1327  WBC 13.5*  HGB 12.0  HCT 36.5  PLT 192  MCV 99.7  MCH 32.8  MCHC 32.9  RDW 14.8  LYMPHSABS 0.8  MONOABS 1.6*  EOSABS 0.2  BASOSABS 0.0   ------------------------------------------------------------------------------------------------------------------  Results for orders placed or performed during the hospital encounter of 06/26/21 (from the past 48 hour(s))  Lactic acid, plasma     Status: None   Collection Time: 06/26/21  1:27 PM  Result Value Ref Range   Lactic Acid, Venous 1.9 0.5 - 1.9 mmol/L    Comment: Performed at [facility], [place] [address], [place], [place] [address]  CBC WITH DIFFERENTIAL     Status: Abnormal   Collection Time: 06/26/21  1:27 PM  Result Value Ref Range   WBC 13.5 (H) 4.0 - 10.5 K/uL   RBC 3.66 (L) 3.87 - 5.11 MIL/uL   Hemoglobin 12.0 12.0 - 15.0 g/dL   HCT 36.5  36.0 - 46.0 %   MCV 99.7 80.0 - 100.0 fL   MCH 32.8 26.0 - 34.0 pg   MCHC 32.9 30.0 - 36.0 g/dL   RDW 14.8 11.5 - 15.5 %   Platelets 192 150 - 400 K/uL   nRBC 0.0 0.0 - 0.2 %   Neutrophils Relative % 81 %   Neutro Abs 10.9 (H) 1.7 - 7.7 K/uL   Lymphocytes Relative 6 %   Lymphs Abs 0.8 0.7 - 4.0 K/uL   Monocytes Relative 12 %   Monocytes Absolute 1.6 (H) 0.1 - 1.0 K/uL   Eosinophils Relative 1 %   Eosinophils Absolute 0.2 0.0 - 0.5 K/uL   Basophils Relative 0 %   Basophils Absolute 0.0 0.0 - 0.1 K/uL   Immature Granulocytes 0 %   Abs Immature Granulocytes 0.06 0.00 - 0.07 K/uL    Comment: Performed at [facility], [place] [address], [place], [place] [address]  Brain natriuretic peptide     Status: Abnormal   Collection Time: 06/26/21  1:27 PM  Result Value Ref Range   B Natriuretic Peptide 103.8 (H) 0.0 - 100.0 pg/mL    Comment: Performed at [facility], [place] [address] [place], [place] [address]  Lactic acid, plasma     Status: None   Collection Time: 06/26/21  3:40 PM  Result Value Ref Range   Lactic  Acid, Venous 0.9 0.5 - 1.9 mmol/L    Comment: Performed at [facility], [place] [address] [place], [place] [address]  Comprehensive metabolic panel     Status: Abnormal   Collection Time: 06/26/21  3:40 PM  Result Value Ref Range   Sodium 133 (L) 135 - 145 mmol/L   Potassium 3.8 3.5 - 5.1 mmol/L   Chloride 100 98 - 111 mmol/L   CO2 26 22 - 32 mmol/L   Glucose, Bld 161 (H) 70 - 99 mg/dL    Comment: Glucose reference range applies only to samples taken after fasting for at least 8 hours.   BUN 22 8 - 23 mg/dL   Creatinine, Ser 0.95 0.44 - 1.00 mg/dL   Calcium 8.8 (L) 8.9 - 10.3 mg/dL   Total Protein 6.6 6.5 - 8.1 g/dL   Albumin 3.1 (L) 3.5 - 5.0 g/dL   AST 31 15 - 41 U/L   ALT 12 0 - 44 U/L   Alkaline Phosphatase 47 38 - 126 U/L   Total Bilirubin 2.2 (H) 0.3 - 1.2 mg/dL   GFR, Estimated 59 (L) >60 mL/min    Comment: (NOTE) Calculated using the CKD-EPI Creatinine Equation (2021)    Anion gap 7 5 - 15    Comment: Performed at [facility], [place] [address], [place], [place] [address]  Protime-INR     Status: Abnormal   Collection Time: 06/26/21  3:40 PM  Result Value Ref Range   Prothrombin Time 25.5 (H) 11.4 - 15.2 seconds   INR 2.3 (H) 0.8 - 1.2    Comment: (NOTE) INR goal varies based on device and disease states. Performed at [facility], [place] [address] [place] [address], [place], [place] [address]   APTT     Status: Abnormal   Collection Time: 06/26/21  3:40 PM  Result Value Ref Range   aPTT 43 (H) 24 - 36 seconds    Comment:        IF BASELINE aPTT IS ELEVATED, SUGGEST PATIENT RISK ASSESSMENT BE USED TO DETERMINE APPROPRIATE ANTICOAGULANT THERAPY. Performed  at [facility], [place] [address] [place], [place] [address]     Chemistries  Recent Labs  Lab 06/26/21 1540  NA 133*  K 3.8  CL 100  CO2 26  GLUCOSE 161*  BUN 22  CREATININE 0.95  CALCIUM 8.8*  AST 31  ALT 12  ALKPHOS 47  BILITOT 2.2*    ------------------------------------------------------------------------------------------------------------------  ------------------------------------------------------------------------------------------------------------------ GFR: Estimated Creatinine Clearance: 59 mL/min (by C-G formula based on SCr of 0.95 mg/dL). Liver Function Tests: Recent Labs  Lab 06/26/21 1540  AST 31  ALT 12  ALKPHOS 47  BILITOT 2.2*  PROT 6.6  ALBUMIN 3.1*   No results for input(s): LIPASE, AMYLASE in the last 168 hours. No results for input(s): AMMONIA in the last 168 hours. Coagulation Profile: Recent Labs  Lab 06/26/21 1540  INR 2.3*     --------------------------------------------------------------------------------------------------------------- Urine analysis:    Component Value Date/Time   COLORURINE YELLOW 05/17/2021 1504   APPEARANCEUR Sl Cloudy (A) 05/17/2021 1504   LABSPEC 1.020 06/12/2021 1458   PHURINE 5.5 06/12/2021 1458   GLUCOSEU NEGATIVE 06/12/2021 1458   GLUCOSEU NEGATIVE 05/17/2021 1504   HGBUR TRACE (A) 06/12/2021 1458   BILIRUBINUR SMALL (A) 06/12/2021 1458   BILIRUBINUR negative 04/26/2016 1526   KETONESUR TRACE (A) 06/12/2021 1458   PROTEINUR NEGATIVE 06/12/2021 1458   UROBILINOGEN 0.2 06/12/2021 1458   NITRITE NEGATIVE 06/12/2021 1458   LEUKOCYTESUR NEGATIVE 06/12/2021 1458      Imaging Results:    DG Chest 2 View  Result Date: 06/26/2021 CLINICAL DATA:  Hypoxia, shortness of breath EXAM: CHEST - 2 VIEW COMPARISON:  06/15/2021 FINDINGS: Bilateral upper lobe and to lesser extent bilateral lower lobe airspace disease concerning for multilobar pneumonia including atypical etiologies. No pleural effusion or pneumothorax. Stable cardiomegaly. No acute osseous abnormality. Osteoarthritis of bilateral glenohumeral joints. IMPRESSION: 1. Bilateral upper lobe and to lesser extent bilateral lower lobe airspace disease concerning for multilobar pneumonia including  atypical etiologies. Electronically Signed   By: Kathreen Devoid M.D.   On: 06/26/2021 10:50    My personal review of EKG: Rhythm atrial fibrillation, rate controlled   Assessment & Plan:    Principal Problem:   Pneumonia   Multifocal pneumonia-patient presented with worsening shortness of breath, chest x-ray showed multifocal pneumonia.  Patient started on vancomycin and Azactam.  Blood cultures x2 obtained.  Follow urinary strep pneumo antigen, Legionella antigen. Acute hypoxemic respiratory failure-patient is on 3 L/min of oxygen at home, still becoming hypoxemic with O2 sats dropping to 70s.  Chest x-ray showed multifocal pneumonia as above.  Started on IV antibiotics as above.  Continue supplemental oxygen. Atrial fibrillation-heart rate controlled, continue Xarelto for anticoagulation.  Continue metoprolol for rate control Hypertension-we will hold HCTZ, continue metoprolol Hypothyroidism-continue Synthroid   DVT Prophylaxis-   Xarelto  AM Labs Ordered, also please review Full Orders  Family Communication: Admission, patients condition and plan of care including tests being ordered have been discussed with the patient and who indicate understanding and agree with the plan and Code Status.  Code Status: Full code  Admission status: Inpatient :The appropriate admission status for this patient is INPATIENT. Inpatient status is judged to be reasonable and necessary in order to provide the required intensity of service to ensure the patient's safety. The patient's presenting symptoms, physical exam findings, and initial radiographic and laboratory data in the context of their chronic comorbidities is felt to place them at high risk for further clinical deterioration. Furthermore, it is not anticipated that the  patient will be medically stable for discharge from the hospital within 2 midnights of admission. The following factors support the admission status of inpatient.         * I  certify that at the point of admission it is my clinical judgment that the patient will require inpatient hospital care spanning beyond 2 midnights from the point of admission due to high intensity of service, high risk for further deterioration and high frequency of surveillance required.*  Time spent in minutes : 60 minutes   Donda Friedli S Tzipporah Nagorski M.D

## 2021-06-26 NOTE — Assessment & Plan Note (Deleted)
Rate controlled. Chronically anticoagulated - no excessive bleeding or bruising. Advised to closely monitor for increase or mass hemoptysis and notify or seek emergency care.

## 2021-06-26 NOTE — Assessment & Plan Note (Signed)
Appears compensated on exam. Recent echo with normal function. Seen by cards post hospitalization with no changes. Follow up with cardiology as scheduled.

## 2021-06-26 NOTE — Progress Notes (Signed)
Pharmacy Antibiotic Note  [name] is a 86 y.o. female admitted on 06/26/2021 with  history of asthma, COPD, OSA on CPAP, CAD, CHF, hypertension, sinus node dysfunction, atrial fibrillation on Xarelto, dysphagia, hypothyroidism, peripheral neuropathy, osteoarthritis, stage Ia left breast cancer 2012, hyperlipidemia, obesity, gout, prediabetes who was recently hospitalized for multifocal pneumonia and asthma exacerbation.  Chest x-ray done at the pulmonology clinic showed persistent airspace opacities concerning for multifocal pneumonia. Pharmacy has been consulted for vancomycin dosing.  Plan: Vancomycin 2gm x1 in ED then 1750mg  q36h (AUC 497.2, Scr 0.95) Follow renal function and clinical course  Height: 5' 6.5" (168.9 cm) Weight: 117.5 kg (259 lb) IBW/kg (Calculated) : 60.45  Temp (24hrs), Avg:98.8 F (37.1 C), Min:98.5 F (36.9 C), Max:99 F (37.2 C)  Recent Labs  Lab 06/26/21 1327 06/26/21 1540  WBC 13.5*  --   CREATININE  --  0.95  LATICACIDVEN 1.9 0.9    Estimated Creatinine Clearance: 59 mL/min (by C-G formula based on SCr of 0.95 mg/dL).    Allergies  Allergen Reactions   Crestor [Rosuvastatin Calcium]     Severe liver function problems   Hydrocodone-Acetaminophen Anxiety and Itching   Lidocaine Hcl Other (See Comments)    Novacaine:  Becomes very shaky   Lipitor [Atorvastatin Calcium] Other (See Comments)    Elevated liver function    Procaine Other (See Comments)    shaking   Rosuvastatin Other (See Comments)    Severe liver function problems   Theophylline Other (See Comments)    Becomes very shaky skaking skaking   Vicodin [Hydrocodone-Acetaminophen] Itching    Itching all over   Codeine Nausea Only, Anxiety and Other (See Comments)    Also complained of dizziness.  Has same problems with oxycodone and hydrocodone Visual Disturbance, Balance Difficulty, Dizzy "Room Spinning; "Swimming" Balance, vision, nausea, dizzy, "swimming", "room spinning" Balance,  vision, nausea, dizzy, "swimming", "room spinning"   Oxycodone-Acetaminophen Nausea Only, Anxiety and Other (See Comments)    Visual Disturbance, Balance Difficulty, Dizzy "Room Spinning; "Swimming"   Propoxyphene Anxiety, Nausea Only and Other (See Comments)    Visual Disturbance, Balance Difficulty, Dizzy "Room Spinning; "Swimming" Balance, vision, nausea, dizzy, "swimming", "room spinning" Balance, vision, nausea, dizzy, "swimming, "room spinning"   Quinine Derivatives Nausea Only    dizzy   Sulfa Antibiotics Nausea Only    Also complained of dizziness   Atorvastatin Other (See Comments)    Severe liver function problems   Ephedrine Other (See Comments)    shaking   Other Other (See Comments)    Quinine causes nausea, dizzy   Oxycodone Other (See Comments)    Balance, vision, nausea, dizzy, "swimming", "room spinning"   Quinine Other (See Comments)   Cephalexin Rash and Other (See Comments)   Epinephrine Other (See Comments)    Patient becomes "shaky" shaking shaking   Penicillins Rash    Pt reported all over body rash in 1958 Has patient had a PCN reaction causing immediate rash, facial/tongue/throat swelling, SOB or lightheadedness with hypotension:Yes Has patient had a PCN reaction causing severe rash involving mucus membranes or skin necrosis: No Has patient had a PCN reaction that required hospitalization: No Has patient had a PCN reaction occurring within the last 10 years:No     Theophyllines Other (See Comments)    Patient becomes "shaky"    Antimicrobials this admission: 2/21 vanc >> 2/21 aztreonam x 1  Dose adjustments this admission:   Microbiology results: 2/21 BCx:    Thank you for allowing pharmacy  to be a part of this patients care.  Dolly Rias RPh 06/26/2021, 11:43 PM

## 2021-06-26 NOTE — Assessment & Plan Note (Addendum)
Was discharged on 3 lpm. Has had desaturations as low as 60's at home; occasionally in 70's. Today with desaturations into 70's with minimal exertion on 3 lpm. Increased to 6 lpm [place] and 90% - transitioned to non rebreather mask. EMS contacted for transport to [place].

## 2021-06-26 NOTE — Assessment & Plan Note (Addendum)
Worsening symptoms with increased SOB and wheezing. Flare likely related to persistent multifocal pna.Albuterol neb in office.

## 2021-06-26 NOTE — ED Provider Notes (Signed)
[facility] DEPT Provider Note   CSN: [id number] Arrival date & time: 06/26/21  1140     History  Chief Complaint  Patient presents with   Shortness of Breath   MAAT [name] is a 86 y.o. female.   Shortness of Breath  Patient has a history of multiple medical problems including hypertension, hypothyroidism, breast cancer, coronary artery disease, A-fib, obstructive sleep apnea, COPD who was recently admitted to the hospital On February 7 and discharged on February 11.  Patient is followed by her primary care doctor as well as the pulmonary service.  Patient states since leaving the hospital she has continued to have problems with cough and shortness of breath.  Patient was started on doxycycline last week because of persistent symptoms.  She had been having fevers up to 102 at home still since hospitalization.  She went for a follow-up visit today and was seen by Marland Kitchen, NP.  I have reviewed the notes from that outpatient visit today. While at the [facility] office patient was noted to have her oxygen saturation dropped into the 70s despite being on her supplemental 3 L of oxygen.  She was given a nebulizer breathing treatment at the office.  She had a chest x-ray that showed bilateral upper lobe and bilateral lower lobe airspace disease concerning for multifocal pneumonia.  Patient was sent to the ED to be readmitted to the hospital Home Medications Prior to Admission medications   Medication Sig Start Date End Date Taking? Authorizing Provider  albuterol (PROVENTIL) (2.5 MG/3ML) 0.083% nebulizer solution Take 3 mLs (2.5 mg total) by nebulization every 6 (six) hours as needed for wheezing or shortness of breath. 09/17/16   Biagio Borg, MD  albuterol (VENTOLIN HFA) 108 (90 Base) MCG/ACT inhaler INHALE 2 PUFFS BY MOUTH INTO THE LUNGS IF NEEDED FOR WHEEZING AND OR SHORTNESS OF BREATH 05/29/21   Biagio Borg, MD  ALLERGY RELIEF 180 MG tablet TAKE 1 TABLET BY  MOUTH EVERY DAY 07/14/20   Biagio Borg, MD  allopurinol (ZYLOPRIM) 300 MG tablet 1 tablet    [provider]  ALPRAZolam Duanne Moron) 0.5 MG tablet TAKE 1 TO 2 TABLETS BY MOUTH TWICE DAILY AS NEEDED FOR ANXIETY OR SLEEP 04/25/21   Biagio Borg, MD  Ascorbic Acid (VITAMIN C) 1000 MG tablet 1 tablet    [provider]  azelastine (ASTELIN) 0.1 % nasal spray Place 2 sprays into both nostrils daily. Use in each nostril as directed 07/14/19   Biagio Borg, MD  B Complex Vitamins (VITAMIN B COMPLEX PO) Take 1 tablet by mouth daily.    [provider]  Biotin 5000 MCG CAPS Take 5,000 mcg by mouth daily.     [provider]  cholecalciferol (VITAMIN D) 1000 UNITS tablet Take 1,000 Units by mouth daily.    [provider]  co-enzyme Q-10 50 MG capsule Take 400 mg by mouth daily.    [provider]  colchicine 0.6 MG tablet Take 0.6 mg by mouth daily as needed (flare  up).     [provider]  Cyanocobalamin (VITAMIN B-12 PO) Take 1 tablet by mouth daily.    [provider]  cycloSPORINE (RESTASIS) 0.05 % ophthalmic emulsion Apply 1 drop to eye 2 (two) times daily as needed (allergies).    [provider]  dextromethorphan-guaiFENesin (MUCINEX DM) 30-600 MG 12hr tablet Take 1 tablet by mouth 2 (two) times daily.    [provider]  doxycycline (  VIBRA-TABS) 100 MG tablet Take 1 tablet (100 mg total) by mouth 2 (two) times daily. 06/22/21   Biagio Borg, MD  EPINEPHrine 0.3 mg/0.3 mL IJ SOAJ injection Inject 0.3 mg into the muscle as needed for anaphylaxis. 12/29/19   [provider]  escitalopram (LEXAPRO) 10 MG tablet TAKE 1 TABLET(10 MG) BY MOUTH DAILY 02/05/21   Biagio Borg, MD  ezetimibe (ZETIA) 10 MG tablet 1/2 tablet    [provider]  fexofenadine (ALLEGRA) 180 MG tablet 1 tablet as needed    [provider]  gabapentin (NEURONTIN) 300 MG capsule Take 1 capsule (300 mg total) by mouth 3  (three) times daily. Patient taking differently: Take 300 mg by mouth at bedtime. 12/17/18   Biagio Borg, MD  Homeopathic Products (ARNICARE) GEL Apply 1 application topically daily as needed (soreness).    [provider]  hydrochlorothiazide (HYDRODIURIL) 25 MG tablet TAKE 2 TABLETS(50 MG) BY MOUTH DAILY 01/17/21   Biagio Borg, MD  letrozole [employer]) 2.5 MG tablet 1 tablet    [provider]  levalbuterol (XOPENEX HFA) 45 MCG/ACT inhaler Inhale 2 puffs into the lungs every 6 (six) hours as needed for wheezing or shortness of breath. Patient not taking: Reported on 06/26/2021 09/30/19   Biagio Borg, MD  levocetirizine (XYZAL) 5 MG tablet Take 1 tablet (5 mg total) by mouth every evening. 06/24/19   Biagio Borg, MD  levothyroxine (SYNTHROID) 75 MCG tablet TAKE 1 TABLET(75 MCG) BY MOUTH DAILY 06/13/21   Biagio Borg, MD  metoprolol succinate (TOPROL-XL) 25 MG 24 hr tablet TAKE 1/2 TABLET(12.5 MG) BY MOUTH DAILY 09/27/20   Jettie Booze, MD  montelukast (SINGULAIR) 10 MG tablet Take 1 tablet (10 mg total) by mouth at bedtime. Patient taking differently: Take 10 mg by mouth at bedtime as needed (allergies). 07/19/19   Biagio Borg, MD  mupirocin ointment Drue Stager) 2 % Use twice a day as directed 02/27/18   Marrian Salvage, FNP  nitrofurantoin (MACRODANTIN) 50 MG capsule Take 50 mg by mouth 3 (three) times a week. Take three days a week (MWF)    [provider]  Omega-3 Fatty Acids (FISH OIL) 1000 MG CAPS 1 capsule    [provider]  ondansetron (ZOFRAN) 4 MG tablet Take 1 tablet (4 mg total) by mouth every 8 (eight) hours as needed for nausea or vomiting. 08/30/20   Biagio Borg, MD  [place] 1 twice a day    [provider]  potassium chloride SA (KLOR-CON M) 20 MEQ tablet TAKE 1 TABLET(20 MEQ) BY MOUTH DAILY 04/24/21   Biagio Borg, MD  Pyridoxine HCl (VITAMIN B-6 PO) Take 1 tablet by mouth daily.     [provider]  [place] 420 MG/3.5ML SOCT INJECT 1 UNIT INTO THE SKIN EVERY 30 DAYS 06/11/21   Jettie Booze, MD  rosuvastatin (CRESTOR) 20 MG tablet Take 20 mg by mouth 2 (two) times a week. Take on Mon & Fri 06/05/20   [provider]  vitamin C (ASCORBIC ACID) 500 MG tablet Take 500 mg by mouth daily.    [provider]  VITAMIN E PO Take 1 tablet by mouth daily.    [provider]  XARELTO 20 MG TABS tablet TAKE 1 TABLET(20 MG) BY MOUTH DAILY WITH SUPPER Patient taking differently: 20 mg every evening. 05/14/21   Jettie Booze, MD  Zinc 100 MG TABS  1 tablet    [provider]      Allergies    Crestor [rosuvastatin calcium], Hydrocodone-acetaminophen, Lidocaine hcl, Lipitor [atorvastatin calcium], Procaine, Rosuvastatin, Theophylline, Vicodin [hydrocodone-acetaminophen], Codeine, Oxycodone-acetaminophen, Propoxyphene, Quinine derivatives, Sulfa antibiotics, Atorvastatin, Ephedrine, Other, Oxycodone, Quinine, Cephalexin, Epinephrine, Penicillins, and Theophyllines    Review of Systems   Review of Systems  Respiratory:  Positive for shortness of breath.    Physical Exam Updated Vital Signs BP 139/73    Pulse 83    Temp 99 F (37.2 C) (Oral)    Resp (!) 24    Ht 1.689 m (5' 6.5")    Wt 117.5 kg    SpO2 99%    BMI 41.18 kg/m  Physical Exam Vitals and nursing note reviewed.  Constitutional:      Appearance: She is well-developed. She is not diaphoretic.  HENT:     Head: Normocephalic and atraumatic.     Right Ear: External ear normal.     Left Ear: External ear normal.  Eyes:     General: No scleral icterus.       Right eye: No discharge.        Left eye: No discharge.     Conjunctiva/sclera: Conjunctivae normal.  Neck:     Trachea: No tracheal deviation.  Cardiovascular:     Rate and Rhythm: Normal rate and regular rhythm.  Pulmonary:     Effort: Pulmonary effort is normal. No respiratory distress.      Breath sounds: No stridor. Decreased breath sounds, wheezing and rales present.  Abdominal:     General: Bowel sounds are normal. There is no distension.     Palpations: Abdomen is soft.     Tenderness: There is no abdominal tenderness. There is no guarding or rebound.  Musculoskeletal:        General: No tenderness or deformity.     Cervical back: Neck supple.  Skin:    General: Skin is warm and dry.     Findings: No rash.  Neurological:     General: No focal deficit present.     Mental Status: She is alert.     Cranial Nerves: No cranial nerve deficit (no facial droop, extraocular movements intact, no slurred speech).     Sensory: No sensory deficit.     Motor: No abnormal muscle tone or seizure activity.     Coordination: Coordination normal.  Psychiatric:        Mood and Affect: Mood normal.    ED Results / Procedures / Treatments   Labs (all labs ordered are listed, but only abnormal results are displayed) Labs Reviewed  CBC WITH DIFFERENTIAL/PLATELET - Abnormal; Notable for the following components:      Result Value   WBC 13.5 (*)    RBC 3.66 (*)    Neutro Abs 10.9 (*)    Monocytes Absolute 1.6 (*)    All other components within normal limits  BRAIN NATRIURETIC PEPTIDE - Abnormal; Notable for the following components:   B Natriuretic Peptide 103.8 (*)    All other components within normal limits  COMPREHENSIVE METABOLIC PANEL - Abnormal; Notable for the following components:   Sodium 133 (*)    Glucose, Bld 161 (*)    Calcium 8.8 (*)    Albumin 3.1 (*)    Total Bilirubin 2.2 (*)    GFR, Estimated 59 (*)    All other components within normal limits  PROTIME-INR - Abnormal; Notable for the following components:   Prothrombin Time 25.5 (*)  INR 2.3 (*)    All other components within normal limits  APTT - Abnormal; Notable for the following components:   aPTT 43 (*)    All other components within normal limits  CULTURE, BLOOD (ROUTINE X 2)  CULTURE, BLOOD  (ROUTINE X 2)  RESP PANEL BY RT-PCR (FLU A&B, COVID) ARPGX2  LACTIC ACID, PLASMA  LACTIC ACID, PLASMA    EKG EKG Interpretation  Date/Time:  Tuesday June 26 2021 11:50:12 EST Ventricular Rate:  91 PR Interval:    QRS Duration: 103 QT Interval:  326 QTC Calculation: 401 R Axis:   68 Text Interpretation: Atrial fibrillation Ventricular bigeminy Repol abnrm suggests ischemia, diffuse leads No significant change since last tracing Confirmed by Dorie Rank [phone]) on 06/26/2021 12:40:28 PM  Radiology DG Chest 2 View  Result Date: 06/26/2021 CLINICAL DATA:  Hypoxia, shortness of breath EXAM: CHEST - 2 VIEW COMPARISON:  06/15/2021 FINDINGS: Bilateral upper lobe and to lesser extent bilateral lower lobe airspace disease concerning for multilobar pneumonia including atypical etiologies. No pleural effusion or pneumothorax. Stable cardiomegaly. No acute osseous abnormality. Osteoarthritis of bilateral glenohumeral joints. IMPRESSION: 1. Bilateral upper lobe and to lesser extent bilateral lower lobe airspace disease concerning for multilobar pneumonia including atypical etiologies. Electronically Signed   By: Kathreen Devoid M.D.   On: 06/26/2021 10:50    Procedures Procedures    Medications Ordered in ED Medications  lactated ringers infusion ( Intravenous New Bag/Given 06/26/21 1325)  aztreonam (AZACTAM) 2 g in sodium chloride 0.9 % 100 mL IVPB (has no administration in time range)  ipratropium-albuterol (DUONEB) 0.5-2.5 (3) MG/3ML nebulizer solution 3 mL (3 mLs Nebulization Given 06/26/21 1312)  methylPREDNISolone sodium succinate (SOLU-MEDROL) 125 mg/2 mL injection 125 mg (125 mg Intravenous Given 06/26/21 1312)  vancomycin (VANCOREADY) IVPB 2000 mg/400 mL (2,000 mg Intravenous New Bag/Given 06/26/21 1316)    ED Course/ Medical Decision Making/ A&P Clinical Course as of 06/26/21 1635  Tue Jun 26, 2021  1619 Comprehensive metabolic panel(!) Bilirubin elevated compared to previous values  [JK]  1619 CBC WITH DIFFERENTIAL(!) Wbc elevated [JK]  1624 Brain natriuretic peptide(!) Normal [JK]  1624 Lactic acid, plasma Lactic acid level normal [JK]  1634 Discussed with Dr Darrick Meigs regarding admission [JK]    Clinical Course User Index [JK] Dorie Rank, MD                           Medical Decision Making Amount and/or Complexity of Data Reviewed Labs: ordered. Decision-making details documented in ED Course. ECG/medicine tests: ordered.  Risk Prescription drug management. Decision regarding hospitalization.   Patient presented to the ED for evaluation of persistent multifocal pneumonia.  Patient also has increased oxygen requirements.  Patient x-ray shows persistent multifocal pneumonia.  No signs of CHF component.  No signs of sepsis.  Patient has been started on IV antibiotics for persistent diffuse pneumonia, failing to respond to outpatient treatment.  I will consult the medical service for admission and further treatment        Final Clinical Impression(s) / ED Diagnoses Final diagnoses:  Multifocal pneumonia     Dorie Rank, MD 06/26/21 1635

## 2021-06-26 NOTE — Progress Notes (Signed)
Pharmacy Note   A consult was received from an ED physician for vancomycin per pharmacy dosing.    The patient's profile has been reviewed for ht/wt/allergies/indication/available labs.    A one time order has been placed for vancomycin 2000 mg IV x1 .    Further antibiotics/pharmacy consults should be ordered by admitting physician if indicated.                       Thank you,  Royetta Asal, PharmD, BCPS 06/26/2021 12:34 PM

## 2021-06-27 DIAGNOSIS — J9601 Acute respiratory failure with hypoxia: Secondary | ICD-10-CM

## 2021-06-27 DIAGNOSIS — I10 Essential (primary) hypertension: Secondary | ICD-10-CM

## 2021-06-27 DIAGNOSIS — E039 Hypothyroidism, unspecified: Secondary | ICD-10-CM

## 2021-06-27 DIAGNOSIS — I482 Chronic atrial fibrillation, unspecified: Secondary | ICD-10-CM

## 2021-06-27 DIAGNOSIS — J441 Chronic obstructive pulmonary disease with (acute) exacerbation: Secondary | ICD-10-CM

## 2021-06-27 DIAGNOSIS — Z6841 Body Mass Index (BMI) 40.0 and over, adult: Secondary | ICD-10-CM

## 2021-06-27 DIAGNOSIS — F32A Depression, unspecified: Secondary | ICD-10-CM

## 2021-06-27 DIAGNOSIS — F419 Anxiety disorder, unspecified: Secondary | ICD-10-CM

## 2021-06-27 LAB — COMPREHENSIVE METABOLIC PANEL
ALT: 15 U/L (ref 0–44)
AST: 24 U/L (ref 15–41)
Albumin: 3.2 g/dL — ABNORMAL LOW (ref 3.5–5.0)
Alkaline Phosphatase: 48 U/L (ref 38–126)
Anion gap: 6 (ref 5–15)
BUN: 25 mg/dL — ABNORMAL HIGH (ref 8–23)
CO2: 25 mmol/L (ref 22–32)
Calcium: 8.7 mg/dL — ABNORMAL LOW (ref 8.9–10.3)
Chloride: 102 mmol/L (ref 98–111)
Creatinine, Ser: 1 mg/dL (ref 0.44–1.00)
GFR, Estimated: 56 mL/min — ABNORMAL LOW (ref >60)
Glucose, Bld: 175 mg/dL — ABNORMAL HIGH (ref 70–99)
Potassium: 3.4 mmol/L — ABNORMAL LOW (ref 3.5–5.1)
Sodium: 133 mmol/L — ABNORMAL LOW (ref 135–145)
Total Bilirubin: 1.1 mg/dL (ref 0.3–1.2)
Total Protein: 6.7 g/dL (ref 6.5–8.1)

## 2021-06-27 LAB — CBC
HCT: 33.1 % — ABNORMAL LOW (ref 36.0–46.0)
Hemoglobin: 10.5 g/dL — ABNORMAL LOW (ref 12.0–15.0)
MCH: 31.6 pg (ref 26.0–34.0)
MCHC: 31.7 g/dL (ref 30.0–36.0)
MCV: 99.7 fL (ref 80.0–100.0)
Platelets: 173 10*3/uL (ref 150–400)
RBC: 3.32 MIL/uL — ABNORMAL LOW (ref 3.87–5.11)
RDW: 14.5 % (ref 11.5–15.5)
WBC: 6.8 10*3/uL (ref 4.0–10.5)
nRBC: 0 % (ref 0.0–0.2)

## 2021-06-27 LAB — MRSA NEXT GEN BY PCR, NASAL: MRSA by PCR Next Gen: NOT DETECTED

## 2021-06-27 MED ORDER — SODIUM CHLORIDE 0.9 % IV SOLN
2.0000 g | INTRAVENOUS | Status: DC
  Administered 2021-06-27 – 2021-06-30 (×4): 2 g via INTRAVENOUS
  Filled 2021-06-27 (×5): qty 20

## 2021-06-27 MED ORDER — IPRATROPIUM-ALBUTEROL 0.5-2.5 (3) MG/3ML IN SOLN: 3.0000 mL | RESPIRATORY_TRACT | Status: DC | PRN

## 2021-06-27 MED ORDER — METHYLPREDNISOLONE SODIUM SUCC 40 MG IJ SOLR
40.0000 mg | Freq: Two times a day (BID) | INTRAMUSCULAR | Status: DC
  Administered 2021-06-27 – 2021-07-01 (×8): 40 mg via INTRAVENOUS
  Filled 2021-06-27 (×8): qty 1

## 2021-06-27 MED ORDER — GUAIFENESIN 100 MG/5ML PO LIQD
5.0000 mL | ORAL | Status: DC | PRN
  Administered 2021-06-27 – 2021-06-30 (×5): 5 mL via ORAL
  Filled 2021-06-27 (×5): qty 10

## 2021-06-27 MED ORDER — ALPRAZOLAM 0.5 MG PO TABS
0.5000 mg | ORAL_TABLET | Freq: Once | ORAL | Status: AC
  Administered 2021-06-27: 0.5 mg via ORAL

## 2021-06-27 MED ORDER — POTASSIUM CHLORIDE CRYS ER 20 MEQ PO TBCR
40.0000 meq | EXTENDED_RELEASE_TABLET | Freq: Once | ORAL | Status: AC
  Administered 2021-06-27: 40 meq via ORAL
  Filled 2021-06-27: qty 2

## 2021-06-27 NOTE — ED Notes (Signed)
Pt remains calm and focusing on her breathing. Instructed to try not to talk as this increases work of breathing. Stated "It feels like its still a lump of water behind the roof of my mouth, but its coming down now"

## 2021-06-27 NOTE — Assessment & Plan Note (Addendum)
Continue bronchodilators, oxygen, incentive spirometry at home.  Patient will be on prednisone taper on discharge.

## 2021-06-27 NOTE — Assessment & Plan Note (Addendum)
Continue Xanax, Lexapro, gabapentin

## 2021-06-27 NOTE — Assessment & Plan Note (Signed)
Continue supportive care

## 2021-06-27 NOTE — ED Notes (Addendum)
Satting 94% on 9 liters at this time. Continues to cough intermittently

## 2021-06-27 NOTE — Progress Notes (Signed)
Patient is not able to wear cpap tonight due to O2 requirements and not having O2 adapter for Cpap machine.

## 2021-06-27 NOTE — ED Notes (Signed)
SBAR reviewed

## 2021-06-27 NOTE — ED Notes (Signed)
ED TO INPATIENT HANDOFF REPORT  Name/Age/Gender [name] 86 y.o. female  Code Status    Code Status Orders  (From admission, onward)           Start     Ordered   06/26/21 2326  Full code  Continuous        06/26/21 2325           Code Status History     Date Active Date Inactive Code Status Order ID Comments User Context   06/12/2021 2048 06/16/2021 2148 Full Code [id number]  Etta Quill, DO ED      Advance Directive Documentation    Flowsheet Row Most Recent Value  Type of Advance Directive Living will  Pre-existing out of facility DNR order (yellow form or pink MOST form) --  "MOST" Form in Place? --       Home/SNF/Other Home  Chief Complaint Pneumonia [J18.9] Multifocal pneumonia [J18.9]  Level of Care/Admitting Diagnosis ED Disposition     ED Disposition  Admit   Condition  --   Comment  Hospital Area: [place] [[id number]] Level of Care: Telemetry [5] Admit to tele based on following criteria: Complex arrhythmia (Bradycardia/Tachycardia) May admit patient to [facility] or [facility] if equivalent level of care  is available:: No Covid Evaluation: covid negative Diagnosis: Multifocal pneumonia [5329924] Admitting Physician: Oswald Hillock [place] Attending Physician: Oswald Hillock [[id number]] Estimated length of stay: past midnight tomorrow Certification:: I  certify this patient will need inpatient services for at least 2 midnights          Medical History Past Medical History:  Diagnosis Date   Allergy    Anxiety    Arthritis    no cartlidge in knees   Asthma    Bradycardia    a. atenolol stopped due to this, HR 40s.   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  CAD s/p RCA stent 2000 with residual mild-mod disease of left system 2001.   Chronic atrial fibrillation (HCC)    COPD (chronic obstructive pulmonary disease) ([place])    Essential hypertension 03/24/2015    Gout    post operative   Hx of radiation therapy 04/02/11 -05/20/11   left breast   Hyperlipidemia    Hypothyroidism 03/24/2015   Incontinence of urine    Malignant neoplasm of upper-outer quadrant of female breast ([place]) 12/26/2010   Osteoporosis 03/24/2015   Peripheral neuropathy 03/24/2015   Plantar fasciitis    Sleep apnea     Allergies Allergies  Allergen Reactions   Crestor [Rosuvastatin Calcium]     Severe liver function problems   Hydrocodone-Acetaminophen Anxiety and Itching   Lidocaine Hcl Other (See Comments)    Novacaine:  Becomes very shaky   Lipitor [Atorvastatin Calcium] Other (See Comments)    Elevated liver function    Procaine Other (See Comments)    shaking   Rosuvastatin Other (See Comments)    Severe liver function problems   Theophylline Other (See Comments)    Becomes very shaky skaking skaking   Vicodin [Hydrocodone-Acetaminophen] Itching    Itching all over   Codeine Nausea Only, Anxiety and Other (See Comments)    Also complained of dizziness.  Has same problems with oxycodone and hydrocodone Visual Disturbance, Balance Difficulty, Dizzy "Room Spinning; "Swimming" Balance, vision, nausea, dizzy, "swimming", "room spinning" Balance, vision, nausea, dizzy, "swimming", "room spinning"   Oxycodone-Acetaminophen  Nausea Only, Anxiety and Other (See Comments)    Visual Disturbance, Balance Difficulty, Dizzy "Room Spinning; "Swimming"   Propoxyphene Anxiety, Nausea Only and Other (See Comments)    Visual Disturbance, Balance Difficulty, Dizzy "Room Spinning; "Swimming" Balance, vision, nausea, dizzy, "swimming", "room spinning" Balance, vision, nausea, dizzy, "swimming, "room spinning"   Quinine Derivatives Nausea Only    dizzy   Sulfa Antibiotics Nausea Only    Also complained of dizziness   Atorvastatin Other (See Comments)    Severe liver function problems   Ephedrine Other (See Comments)    shaking   Other Other (See Comments)    Quinine causes  nausea, dizzy   Oxycodone Other (See Comments)    Balance, vision, nausea, dizzy, "swimming", "room spinning"   Quinine Other (See Comments)   Epinephrine Other (See Comments)    Patient becomes "shaky" shaking shaking   Penicillins Rash    Pt reported all over body rash in 1958 Has patient had a PCN reaction causing immediate rash, facial/tongue/throat swelling, SOB or lightheadedness with hypotension:Yes Has patient had a PCN reaction causing severe rash involving mucus membranes or skin necrosis: No Has patient had a PCN reaction that required hospitalization: No Has patient had a PCN reaction occurring within the last 10 years:No     Theophyllines Other (See Comments)    Patient becomes "shaky"    IV Location/Drains/Wounds Patient Lines/Drains/Airways Status     Active Line/Drains/Airways     Name Placement date Placement time Site Days   Peripheral IV 06/26/21 20 G 1" Distal;Left;Posterior Forearm 06/26/21  1240  Forearm  1            Labs/Imaging Results for orders placed or performed during the hospital encounter of 06/26/21 (from the past 48 hour(s))  Lactic acid, plasma     Status: None   Collection Time: 06/26/21  1:27 PM  Result Value Ref Range   Lactic Acid, Venous 1.9 0.5 - 1.9 mmol/L    Comment: Performed at [facility], [place] [address], [place], [place] [address]  CBC WITH DIFFERENTIAL     Status: Abnormal   Collection Time: 06/26/21  1:27 PM  Result Value Ref Range   WBC 13.5 (H) 4.0 - 10.5 K/uL   RBC 3.66 (L) 3.87 - 5.11 MIL/uL   Hemoglobin 12.0 12.0 - 15.0 g/dL   HCT 36.5 36.0 - 46.0 %   MCV 99.7 80.0 - 100.0 fL   MCH 32.8 26.0 - 34.0 pg   MCHC 32.9 30.0 - 36.0 g/dL   RDW 14.8 11.5 - 15.5 %   Platelets 192 150 - 400 K/uL   nRBC 0.0 0.0 - 0.2 %   Neutrophils Relative % 81 %   Neutro Abs 10.9 (H) 1.7 - 7.7 K/uL   Lymphocytes Relative 6 %   Lymphs Abs 0.8 0.7 - 4.0 K/uL   Monocytes Relative 12 %   Monocytes Absolute 1.6 (H)  0.1 - 1.0 K/uL   Eosinophils Relative 1 %   Eosinophils Absolute 0.2 0.0 - 0.5 K/uL   Basophils Relative 0 %   Basophils Absolute 0.0 0.0 - 0.1 K/uL   Immature Granulocytes 0 %   Abs Immature Granulocytes 0.06 0.00 - 0.07 K/uL    Comment: Performed at [facility], [place] [address] [place], [place] [address]  Blood Culture (routine x 2)     Status: None (Preliminary result)   Collection Time: 06/26/21  1:27 PM   Specimen: BLOOD LEFT HAND  Result  Value Ref Range   Specimen Description BLOOD LEFT HAND    Special Requests      BOTTLES DRAWN AEROBIC AND ANAEROBIC Blood Culture results may not be optimal due to an inadequate volume of blood received in culture bottles   Culture      NO GROWTH < 24 HOURS Performed at [place] [address] [place], [place] [address]    Report Status PENDING   Brain natriuretic peptide     Status: Abnormal   Collection Time: 06/26/21  1:27 PM  Result Value Ref Range   B Natriuretic Peptide 103.8 (H) 0.0 - 100.0 pg/mL    Comment: Performed at [facility], [place] [address] [place], [place] [address]  Lactic acid, plasma     Status: None   Collection Time: 06/26/21  3:40 PM  Result Value Ref Range   Lactic Acid, Venous 0.9 0.5 - 1.9 mmol/L    Comment: Performed at [facility], [place] [address], [place], [place] [address]  Comprehensive metabolic panel     Status: Abnormal   Collection Time: 06/26/21  3:40 PM  Result Value Ref Range   Sodium 133 (L) 135 - 145 mmol/L   Potassium 3.8 3.5 - 5.1 mmol/L   Chloride 100 98 - 111 mmol/L   CO2 26 22 - 32 mmol/L   Glucose, Bld 161 (H) 70 - 99 mg/dL    Comment: Glucose reference range applies only to samples taken after fasting for at least 8 hours.   BUN 22 8 - 23 mg/dL   Creatinine, Ser 0.95 0.44 - 1.00 mg/dL   Calcium 8.8 (L) 8.9 - 10.3 mg/dL   Total Protein 6.6 6.5 - 8.1 g/dL   Albumin 3.1 (L) 3.5 - 5.0 g/dL   AST 31 15 - 41 U/L   ALT 12 0  - 44 U/L   Alkaline Phosphatase 47 38 - 126 U/L   Total Bilirubin 2.2 (H) 0.3 - 1.2 mg/dL   GFR, Estimated 59 (L) >60 mL/min    Comment: (NOTE) Calculated using the CKD-EPI Creatinine Equation (2021)    Anion gap 7 5 - 15    Comment: Performed at [facility], [place] [address] [place], [place] [address]  Protime-INR     Status: Abnormal   Collection Time: 06/26/21  3:40 PM  Result Value Ref Range   Prothrombin Time 25.5 (H) 11.4 - 15.2 seconds   INR 2.3 (H) 0.8 - 1.2    Comment: (NOTE) INR goal varies based on device and disease states. Performed at [facility], [place] [address] [place], [place] [address]   APTT     Status: Abnormal   Collection Time: 06/26/21  3:40 PM  Result Value Ref Range   aPTT 43 (H) 24 - 36 seconds    Comment:        IF BASELINE aPTT IS ELEVATED, SUGGEST PATIENT RISK ASSESSMENT BE USED TO DETERMINE APPROPRIATE ANTICOAGULANT THERAPY. Performed at [facility], [place] [address], [place],  [address]   Resp Panel by RT-PCR (Flu A&B, Covid)     Status: None   Collection Time: 06/26/21  4:35 PM   Specimen: Nasopharyngeal(NP) swabs in vial transport medium  Result Value Ref Range   SARS Coronavirus 2 by RT PCR NEGATIVE NEGATIVE    Comment: (NOTE) SARS-CoV-2 target nucleic acids are NOT DETECTED.  The SARS-CoV-2 RNA is generally detectable in upper respiratory specimens during the acute phase of  infection. The lowest concentration of SARS-CoV-2 viral copies this assay can detect is 138 copies/mL. A negative result does not preclude SARS-Cov-2 infection and should not be used as the sole basis for treatment or other patient management decisions. A negative result may occur with  improper specimen collection/handling, submission of specimen other than nasopharyngeal swab, presence of viral mutation(s) within the areas targeted by this assay, and inadequate number of viral copies(<138 copies/mL).  A negative result must be combined with clinical observations, patient history, and epidemiological information. The expected result is Negative.  Fact Sheet for Patients:  [name]  Fact Sheet for Healthcare Providers:  [name]  This test is no t yet approved or cleared by the [name] FDA and  has been authorized for detection and/or diagnosis of SARS-CoV-2 by FDA under an Emergency Use Authorization (EUA). This EUA will remain  in effect (meaning this test can be used) for the duration of the COVID-19 declaration under Section 564(b)(1) of the Act, 21 U.S.C.section 360bbb-3(b)(1), unless the authorization is terminated  or revoked sooner.       Influenza A by PCR NEGATIVE NEGATIVE   Influenza B by PCR NEGATIVE NEGATIVE    Comment: (NOTE) The Xpert Xpress SARS-CoV-2/FLU/RSV plus assay is intended as an aid in the diagnosis of influenza from Nasopharyngeal swab specimens and should not be used as a sole basis for treatment. Nasal washings and aspirates are unacceptable for Xpert Xpress SARS-CoV-2/FLU/RSV testing.  Fact Sheet for Patients: [name]  Fact Sheet for Healthcare Providers: [name]  This test is not yet approved or cleared by the [name] FDA and has been authorized for detection and/or diagnosis of SARS-CoV-2 by FDA under an Emergency Use Authorization (EUA). This EUA will remain in effect (meaning this test can be used) for the duration of the COVID-19 declaration under Section 564(b)(1) of the Act, 21 U.S.C. section 360bbb-3(b)(1), unless the authorization is terminated or revoked.  Performed at [facility], [place] [address] [place], [place] [address]   CBC     Status: Abnormal   Collection Time: 06/27/21  4:06 AM  Result Value Ref Range   WBC 6.8 4.0 - 10.5 K/uL   RBC 3.32 (L) 3.87 - 5.11  MIL/uL   Hemoglobin 10.5 (L) 12.0 - 15.0 g/dL   HCT 33.1 (L) 36.0 - 46.0 %   MCV 99.7 80.0 - 100.0 fL   MCH 31.6 26.0 - 34.0 pg   MCHC 31.7 30.0 - 36.0 g/dL   RDW 14.5 11.5 - 15.5 %   Platelets 173 150 - 400 K/uL   nRBC 0.0 0.0 - 0.2 %    Comment: Performed at [facility], [place] [address], [place],  [address]  Comprehensive metabolic panel     Status: Abnormal   Collection Time: 06/27/21  4:06 AM  Result Value Ref Range   Sodium 133 (L) 135 - 145 mmol/L   Potassium 3.4 (L) 3.5 - 5.1 mmol/L   Chloride 102 98 - 111 mmol/L   CO2 25 22 - 32 mmol/L   Glucose, Bld 175 (H) 70 - 99 mg/dL    Comment: Glucose reference range applies only to samples taken after fasting for at least 8 hours.   BUN 25 (H) 8 - 23 mg/dL   Creatinine, Ser 1.00 0.44 - 1.00 mg/dL   Calcium 8.7 (L) 8.9 - 10.3 mg/dL   Total Protein 6.7 6.5 - 8.1 g/dL   Albumin 3.2 (L) 3.5 - 5.0 g/dL   AST  24 15 - 41 U/L   ALT 15 0 - 44 U/L   Alkaline Phosphatase 48 38 - 126 U/L   Total Bilirubin 1.1 0.3 - 1.2 mg/dL   GFR, Estimated 56 (L) >60 mL/min    Comment: (NOTE) Calculated using the CKD-EPI Creatinine Equation (2021)    Anion gap 6 5 - 15    Comment: Performed at [facility], [place] [address] [place], [place] [address]   DG Chest 2 View  Result Date: 06/26/2021 CLINICAL DATA:  Hypoxia, shortness of breath EXAM: CHEST - 2 VIEW COMPARISON:  06/15/2021 FINDINGS: Bilateral upper lobe and to lesser extent bilateral lower lobe airspace disease concerning for multilobar pneumonia including atypical etiologies. No pleural effusion or pneumothorax. Stable cardiomegaly. No acute osseous abnormality. Osteoarthritis of bilateral glenohumeral joints. IMPRESSION: 1. Bilateral upper lobe and to lesser extent bilateral lower lobe airspace disease concerning for multilobar pneumonia including atypical etiologies. Electronically Signed   By: Kathreen Devoid M.D.   On: 06/26/2021 10:50    Pending  Labs Unresulted Labs (From admission, onward)     Start     Ordered   06/28/21 2449  Basic metabolic panel  Tomorrow morning,   R        06/27/21 1057   06/28/21 0500  CBC  Tomorrow morning,   R        06/27/21 1057   06/28/21 0500  Magnesium  Tomorrow morning,   R        06/27/21 1057   06/27/21 1013  MRSA Next Gen by PCR, Nasal  Once,   R        06/27/21 1012   06/26/21 1228  Blood Culture (routine x 2)  (Undifferentiated presentation (screening labs and basic nursing orders))  BLOOD CULTURE X 2,   STAT      06/26/21 1229            Vitals/Pain Today's Vitals   06/27/21 0500 06/27/21 0600 06/27/21 0700 06/27/21 1129  BP: 97/67 (!) 104/58 (!) 152/69 (!) 145/101  Pulse: 74 84 72 75  Resp: (!) 24 (!) 23 (!) 26 18  Temp:    98.1 F (36.7 C)  TempSrc:    Oral  SpO2: 100% 96% 97% 100%  Weight:      Height:      PainSc:        Isolation Precautions No active isolations  Medications Medications  ALPRAZolam (XANAX) tablet 0.5 mg (0.5 mg Oral Given 06/27/21 0157)  escitalopram (LEXAPRO) tablet 10 mg (10 mg Oral Given 06/27/21 0948)  levothyroxine (SYNTHROID) tablet 75 mcg (75 mcg Oral Given 06/27/21 0631)  gabapentin (NEURONTIN) capsule 300 mg (300 mg Oral Given 06/27/21 0010)  sodium chloride flush (NS) 0.9 % injection 3 mL (3 mLs Intravenous Given 06/27/21 1011)  sodium chloride flush (NS) 0.9 % injection 3 mL (has no administration in time range)  0.9 %  sodium chloride infusion (has no administration in time range)  ondansetron (ZOFRAN) tablet 4 mg (has no administration in time range)    Or  ondansetron (ZOFRAN) injection 4 mg (has no administration in time range)  rivaroxaban (XARELTO) tablet 20 mg (20 mg Oral Given 06/27/21 0010)  metoprolol succinate (TOPROL-XL) 24 hr tablet 12.5 mg (12.5 mg Oral Given 06/27/21 0010)  vancomycin (VANCOREADY) IVPB 1750 mg/350 mL (has no administration in time range)  menthol-cetylpyridinium (CEPACOL) lozenge 3 mg (3 mg Oral Given 06/27/21  0012)  ipratropium-albuterol (DUONEB) 0.5-2.5 (3) MG/3ML nebulizer solution 3 mL (  has no administration in time range)  methylPREDNISolone sodium succinate (SOLU-MEDROL) 40 mg/mL injection 40 mg (has no administration in time range)  cefTRIAXone (ROCEPHIN) 2 g in sodium chloride 0.9 % 100 mL IVPB (2 g Intravenous New Bag/Given 06/27/21 1105)  guaiFENesin (ROBITUSSIN) 100 MG/5ML liquid 5 mL (has no administration in time range)  potassium chloride SA (KLOR-CON M) CR tablet 40 mEq (has no administration in time range)  aztreonam (AZACTAM) 2 g in sodium chloride 0.9 % 100 mL IVPB (0 g Intravenous Stopped 06/26/21 1911)  ipratropium-albuterol (DUONEB) 0.5-2.5 (3) MG/3ML nebulizer solution 3 mL (3 mLs Nebulization Given 06/26/21 1312)  methylPREDNISolone sodium succinate (SOLU-MEDROL) 125 mg/2 mL injection 125 mg (125 mg Intravenous Given 06/26/21 1312)  vancomycin (VANCOREADY) IVPB 2000 mg/400 mL (0 mg Intravenous Stopped 06/26/21 1420)  ALPRAZolam (XANAX) tablet 0.5 mg (0.5 mg Oral Given 06/27/21 1011)    Mobility walks with device

## 2021-06-27 NOTE — Assessment & Plan Note (Signed)
Continue CPAP at nighttime.

## 2021-06-27 NOTE — Assessment & Plan Note (Addendum)
Improved with replacement.  Will prescribe on discharge.

## 2021-06-27 NOTE — Assessment & Plan Note (Addendum)
Failed outpatient treatment.  Received Rocephin during hospitalization..  Blood cultures negative so far.  Afebrile.  No leukocytosis.  Patient has significantly improved at this time.  Has completed course of antibiotic.  We will discontinue on discharge.

## 2021-06-27 NOTE — Progress Notes (Addendum)
PROGRESS NOTE    ARDA DAGGS  XNT:[id number] DOB: 08/22/1935 DOA: 06/26/2021 PCP: Biagio Borg, MD    Brief Narrative:  [name]  is a 86 y.o female with past medical history of asthma, COPD, obstructive sleep apnea on CPAP, CAD, congestive heart failure, hypertension, sinus node dysfunction, chronic atrial fibrillation on Xarelto, hypothyroidism, peripheral neuropathy, history of breast cancer, obesity who was recently hospitalized for multifocal pneumonia and asthma exacerbation.  Patient was seen by her primary care physician as outpatient on Friday and was started on doxycycline for worsening symptoms including fever and worsening dyspnea.  She was noted to be hypoxic with saturation up to 70% on ambulation despite being on 3 L of oxygen.  Chest x-ray done in the pulmonary clinic showed persistent airspace opacity suggestive of multifocal pneumonia.  Patient was then sent back to the hospital due to worsening dyspnea hypoxia and was admitted to the hospital for further evaluation and treatment.       Assessment and Plan: Multifocal pneumonia- (present on admission) Patient presented with worsening shortness of breath especially on minimal exertion..  Recent admission for pneumonia and has failed outpatient treatment.  Patient had a fever up to 102 F.  Difficulty maintaining oxygen saturation in the office and was desaturating up to 70% on 3 L of nasal cannula..  Patient has been started on vancomycin and azactam.  We will continue with vancomycin and Rocephin.  History of allergy to Keflex but has tolerated in the past.  Communicated with pharmacy about this.  Follow blood cultures.  Follow strep neumonia and Legionella urinary antigen.  Leukocytosis  has improved.  Patient max of 8F we will discontinue IV fluids.  We will add IV Solu-Medrol for now.  Acute respiratory failure with hypoxia ([place])- (present on admission) Likely secondary to pneumonia but patient does have history of COPD and  obstructive sleep apnea.  Mild COPD exacerbation as well.  Currently on 4 L of nasal cannula oxygen saturations around 96%.  Continue to support.  Continue treatment for pneumonia.  Continue incentive spirometry, flutter valve, bronchodilators.  Atrial fibrillation, chronic ([place])- (present on admission) Continue metoprolol and Xarelto from home.  At this time.  COPD with acute exacerbation (HCC) Continue bronchodilators, oxygen, incentive spirometry, flutter valve.  We will add IV Solu-Medrol for now.  OSA on CPAP Continue CPAP at nighttime.  Morbid obesity with BMI of 45.0-49.9, adult ([place]) Continue supportive care.  Anxiety and depression- (present on admission) Continue Xanax, Lexapro, gabapentin from home.  Essential hypertension- (present on admission) On metoprolol from home.  Continue to monitor blood pressure.  Hypothyroidism- (present on admission) Continue Synthroid from home.  Most recent TSH of 7.4  Hypokalemia- (present on admission) We will replenish.  Check levels in a.m.      DVT prophylaxis:  rivaroxaban (XARELTO) tablet 20 mg   Code Status:     Code Status: Full Code  Disposition: Home with home health Status is: Inpatient Remains inpatient appropriate because: Multifocal pneumonia, respiratory failure,, IV antibiotic   Family Communication: Spoke with the patient at bedside  Consultants:  None  Procedures:  None  Antimicrobials:  Vancomycin Rocephin  Anti-infectives (From admission, onward)    Start     Dose/Rate Route Frequency Ordered Stop   06/28/21 0000  vancomycin (VANCOREADY) IVPB 1750 mg/350 mL        1,750 mg 175 mL/hr over 120 Minutes Intravenous Every 36 hours 06/26/21 2345     06/27/21 1200  cefTRIAXone (ROCEPHIN) 2  g in sodium chloride 0.9 % 100 mL IVPB        2 g 200 mL/hr over 30 Minutes Intravenous Every 24 hours 06/27/21 1039     06/26/21 1245  vancomycin (VANCOREADY) IVPB 2000 mg/400 mL        2,000 mg 200 mL/hr over  120 Minutes Intravenous  Once 06/26/21 1234 06/26/21 1420   06/26/21 1230  aztreonam (AZACTAM) 2 g in sodium chloride 0.9 % 100 mL IVPB        2 g 200 mL/hr over 30 Minutes Intravenous  Once 06/26/21 1229 06/26/21 1911       Subjective: Today, patient was seen and examined at bedside.  Complains of cough and unable to expectorate.  Denies any chest pain.  Denies any nausea vomiting.  No fever or chills today.  Feels that her breathing is slightly improved today.  Feels anxious.  Objective: Vitals:   06/27/21 0200 06/27/21 0500 06/27/21 0600 06/27/21 0700  BP: (!) 177/103 97/67 (!) 104/58 (!) 152/69  Pulse: 94 74 84 72  Resp: (!) 36 (!) 24 (!) 23 (!) 26  Temp:      TempSrc:      SpO2: 90% 100% 96% 97%  Weight:      Height:        Intake/Output Summary (Last 24 hours) at 06/27/2021 1058 Last data filed at 06/27/2021 1011 Gross per 24 hour  Intake 1003 ml  Output 850 ml  Net 153 ml   Filed Weights   06/26/21 1159  Weight: 117.5 kg    Physical Examination:  General:  Average built, not in obvious distress, mildly anxious, on nasal cannula oxygen HENT:   No scleral pallor or icterus noted. Oral mucosa is moist.  Chest:    Diminished breath sounds bilaterally.  Coarse breath sounds noted with mild wheezes CVS: S1 &S2 heard. No murmur.  Regular rate and rhythm. Abdomen: Soft, nontender, nondistended.  Bowel sounds are heard.   Extremities: No cyanosis, clubbing bilateral lower extremity trace edema.  Peripheral pulses are palpable. Psych: Alert, awake and oriented, normal mood CNS:  No cranial nerve deficits.  Moving all extremities. Skin: Warm and dry.  No rashes noted.  Data Reviewed:   CBC: Recent Labs  Lab 06/26/21 1327 06/27/21 0406  WBC 13.5* 6.8  NEUTROABS 10.9*  --   HGB 12.0 10.5*  HCT 36.5 33.1*  MCV 99.7 99.7  PLT 192 812    Basic Metabolic Panel: Recent Labs  Lab 06/26/21 1540 06/27/21 0406  NA 133* 133*  K 3.8 3.4*  CL 100 102  CO2 26 25   GLUCOSE 161* 175*  BUN 22 25*  CREATININE 0.95 1.00  CALCIUM 8.8* 8.7*    Liver Function Tests: Recent Labs  Lab 06/26/21 1540 06/27/21 0406  AST 31 24  ALT 12 15  ALKPHOS 47 48  BILITOT 2.2* 1.1  PROT 6.6 6.7  ALBUMIN 3.1* 3.2*     Radiology Studies: DG Chest 2 View  Result Date: 06/26/2021 CLINICAL DATA:  Hypoxia, shortness of breath EXAM: CHEST - 2 VIEW COMPARISON:  06/15/2021 FINDINGS: Bilateral upper lobe and to lesser extent bilateral lower lobe airspace disease concerning for multilobar pneumonia including atypical etiologies. No pleural effusion or pneumothorax. Stable cardiomegaly. No acute osseous abnormality. Osteoarthritis of bilateral glenohumeral joints. IMPRESSION: 1. Bilateral upper lobe and to lesser extent bilateral lower lobe airspace disease concerning for multilobar pneumonia including atypical etiologies. Electronically Signed   By: Kathreen Devoid M.D.   On:  06/26/2021 10:50      LOS: 1 day    Flora Lipps, MD [facility] 06/27/2021, 10:58 AM

## 2021-06-27 NOTE — TOC Initial Note (Signed)
Transition of Care [facility]) - Initial/Assessment Note    Patient Details  Name: [name] MRN: [id number] Date of Birth: March 20, 1936  Transition of Care [facility]) CM/SW Contact:    Dessa Phi, RN Phone Number: 06/27/2021, 3:38 PM  Clinical Narrative: Spoke to patient/dtr [name]-d/c plan home;active w/[facility] home 02. Has own transport.                  Expected Discharge Plan: [place] Barriers to Discharge: Continued Medical Work up   Patient Goals and CMS Choice Patient states their goals for this hospitalization and ongoing recovery are:: Home CMS Medicare.gov Compare Post Acute Care list provided to:: Patient Represenative (must comment) (dtr [name])    Expected Discharge Plan and Services Expected Discharge Plan: [place]   Discharge Planning Services: CM Consult Post Acute Care Choice: [place] arrangements for the past 2 months: Single Family Home                                      Prior Living Arrangements/Services Living arrangements for the past 2 months: Single Family Home Lives with:: Self Patient language and need for interpreter reviewed:: Yes Do you feel safe going back to the place where you live?: Yes      Need for Family Participation in Patient Care: Yes (Comment) Care giver support system in place?: Yes (comment) Current home services: DME ([facility]-home 02;[facility])    Activities of Daily Living Home Assistive Devices/Equipment: Walker (specify type) ADL Screening (condition at time of admission) Patient's cognitive ability adequate to safely complete daily activities?: Yes Is the patient deaf or have difficulty hearing?: No Does the patient have difficulty seeing, even when wearing glasses/contacts?: No Does the patient have difficulty concentrating, remembering, or making decisions?: No Patient able to express need for assistance with ADLs?: Yes Does the patient have  difficulty dressing or bathing?: Yes Independently performs ADLs?: Yes (appropriate for developmental age) Does the patient have difficulty walking or climbing stairs?: Yes Weakness of Legs: Both Weakness of Arms/Hands: Both  Permission Sought/Granted Permission sought to share information with : Case Manager Permission granted to share information with : Yes, Verbal Permission Granted  Share Information with NAME: Case Manager           Emotional Assessment              Admission diagnosis:  Pneumonia [J18.9] Multifocal pneumonia [J18.9] Patient Active Problem List   Diagnosis Date Noted   Pneumonia 06/26/2021   Multifocal pneumonia 06/15/2021   COPD with acute exacerbation ([place]) 06/12/2021   Elevated troponin 06/12/2021   Acute respiratory failure with hypoxia ([place]) 06/12/2021   Febrile illness 05/22/2021   Anxiety state 10/23/2020   Pure hypercholesterolemia 10/23/2020   Urinary tract infectious disease 10/23/2020   Hypertension 10/23/2020   Atherosclerosis of coronary artery 10/23/2020   Sinus node dysfunction ([place]) 10/23/2020   Exacerbation of asthma 10/23/2020   Mixed hyperlipidemia 10/23/2020   OSA on CPAP 10/23/2020   Fall 09/05/2020   Nausea 04/23/2020   Constipation 04/23/2020   Posterior uveitis, 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   Dysphagia 01/21/2020   CHF (congestive heart failure) ([place]) 11/23/2019   Asthma exacerbation 12/04/2018   Exposure to COVID-19 virus 12/04/2018   Osteoarthritis 09/19/2018   Abscess of left leg 09/26/2017   Left leg cellulitis 09/18/2017   Whiplash 06/10/2017   Cough 05/29/2017  Concussion 05/29/2017   Hammer toes of both feet 12/19/2016   Allergic rhinitis 12/09/2016   Pedal edema 09/17/2016   Chest pain 07/18/2016   Gross hematuria 04/26/2016   Prediabetes 03/21/2016   Dysuria 12/07/2015   Fever blister 12/07/2015   Choroidal nevus, 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   Malignant neoplasm of left female breast ([place]) 10/10/2015   Anxiety  and depression 06/30/2015   Encounter for well adult exam with abnormal findings 06/30/2015   Neck mass 03/24/2015   Hypothyroidism 03/24/2015   Gout 03/24/2015   Essential hypertension 03/24/2015   Asthma 03/24/2015   Peripheral neuropathy 03/24/2015   Osteoporosis 03/24/2015   OSA (obstructive sleep apnea) 03/24/2015   Morbid obesity ([place]) 03/24/2015   Long term current use of anticoagulant therapy 03/16/2015   Lymphadenopathy, cervical 03/16/2015   Diarrhea 03/16/2015   Hyperbilirubinemia 03/16/2015   Coronary arteriosclerosis 09/22/2014   Hyperlipidemia 07/23/2013   Hypokalemia 02/18/2013   Elevated bilirubin 02/18/2013   Morbid obesity with BMI of 45.0-49.9, adult ([place]) 10/29/2012   Rotator cuff tear arthropathy 10/29/2012   DJD (degenerative joint disease) of knee 10/29/2012   Plantar fasciitis    Arthritis    Hx of radiation therapy    Atrial fibrillation, chronic ([place]) 01/21/2011   Malignant neoplasm of upper-outer quadrant of left breast in female, estrogen receptor positive ([place]) 12/26/2010   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 _ 12/26/2010   PCP:  Biagio Borg, MD Pharmacy:   [facility] [place], [place] - [address] [place] [place] [address] Phone: [phone] Fax: [place] [phone], [place] [address] Phone: [phone] Fax: [phone]     Social Determinants of Health (SDOH) Interventions    Readmission Risk Interventions Readmission Risk Prevention Plan 06/27/2021 06/15/2021  Transportation Screening Complete Complete  PCP or Specialist Appt within 3-5 Days - Complete  HRI or [place] - Complete  Social Work Consult for [place] Planning/Counseling - Complete  Palliative Care Screening - Complete  Medication Review Press photographer) Complete Complete  PCP or  Specialist appointment within 3-5 days of discharge Complete -  [place] or Home Care Consult Complete -  SW Recovery Care/Counseling Consult Complete -  Palliative Care Screening Not Applicable -  [place] Not Applicable -  Some recent data might be hidden

## 2021-06-27 NOTE — ED Notes (Signed)
Patient provided with extra pillow for comfort and repositioned in bed. Lights lowered per request and blankets readjusted.

## 2021-06-27 NOTE — Assessment & Plan Note (Addendum)
Continue metoprolol and Xarelto. Rate controlled at this time.

## 2021-06-27 NOTE — ED Notes (Signed)
Patient requesting cough drops and nighttime medication, this nurse explained we will verify medications with pharmacy and get an order for her cough drops. Patient agreed to plan and verbalized understanding. Provided with cup of water and water pitcher at bedside.

## 2021-06-27 NOTE — Hospital Course (Addendum)
[name]  is a 86 y.o female with past medical history of asthma, COPD, obstructive sleep apnea on CPAP, CAD, congestive heart failure, hypertension, sinus node dysfunction, chronic atrial fibrillation on Xarelto, hypothyroidism, peripheral neuropathy, history of breast cancer, obesity with recent hospitalization for multifocal pneumonia and asthma exacerbation presented to the outpatient clinic with worsening dyspnea and fever.  She was also noted to be hypoxic saturation up to 70% on ambulation despite being on 3 L of oxygen.  Chest x-ray done at the clinic showed persistent airspace opacity suggestive of multifocal pneumonia.  Patient was then sent back to the hospital due to worsening dyspnea, hypoxia.

## 2021-06-27 NOTE — Assessment & Plan Note (Addendum)
On metoprolol and HCTZ , continue on discharge

## 2021-06-27 NOTE — ED Notes (Signed)
Pt started choking on her grape juice while eating breakfast. Sats dropped to 82% on 4 liters. Instructed pt to keep coughing it up. Increased O2 to 9 liters. Pt satting 92% on 9 liters and is continuing to cough up the juice. Instructed pt to focus on her breathing and stay calm.

## 2021-06-27 NOTE — Assessment & Plan Note (Addendum)
Continue Synthroid from home.  Most recent TSH of 7.4

## 2021-06-27 NOTE — Progress Notes (Signed)
Rt gave pt flutter valve per MD order. Pt knows and understands how to use. 

## 2021-06-27 NOTE — Plan of Care (Signed)

## 2021-06-27 NOTE — Assessment & Plan Note (Addendum)
Likely multifactorial secondary to pneumonia but patient does have history of asthma and obstructive sleep apnea.  No history of COPD as per the family. Continue incentive spirometry on discharge.  Received IV Rocephin during hospitalization.  Concern for aspiration so speech therapy was consulted and has been recommended regular consistency diet.  Negative balance for 5895 ml after IV Lasix x 2.  2D echocardiogram on 06/23/2021 showed LV ejection fraction of 60 to 65%.  Patient has been using 3 L of oxygen at home recently

## 2021-06-28 LAB — CBC
HCT: 33.8 % — ABNORMAL LOW (ref 36.0–46.0)
Hemoglobin: 10.6 g/dL — ABNORMAL LOW (ref 12.0–15.0)
MCH: 31.5 pg (ref 26.0–34.0)
MCHC: 31.4 g/dL (ref 30.0–36.0)
MCV: 100.6 fL — ABNORMAL HIGH (ref 80.0–100.0)
Platelets: 188 10*3/uL (ref 150–400)
RBC: 3.36 MIL/uL — ABNORMAL LOW (ref 3.87–5.11)
RDW: 14.4 % (ref 11.5–15.5)
WBC: 10.5 10*3/uL (ref 4.0–10.5)
nRBC: 0 % (ref 0.0–0.2)

## 2021-06-28 LAB — BASIC METABOLIC PANEL
Anion gap: 4 — ABNORMAL LOW (ref 5–15)
BUN: 24 mg/dL — ABNORMAL HIGH (ref 8–23)
CO2: 27 mmol/L (ref 22–32)
Calcium: 8.9 mg/dL (ref 8.9–10.3)
Chloride: 104 mmol/L (ref 98–111)
Creatinine, Ser: 0.82 mg/dL (ref 0.44–1.00)
GFR, Estimated: >60 mL/min (ref >60)
Glucose, Bld: 158 mg/dL — ABNORMAL HIGH (ref 70–99)
Potassium: 4.3 mmol/L (ref 3.5–5.1)
Sodium: 135 mmol/L (ref 135–145)

## 2021-06-28 LAB — MAGNESIUM: Magnesium: 2.4 mg/dL (ref 1.7–2.4)

## 2021-06-28 MED ORDER — FUROSEMIDE 10 MG/ML IJ SOLN
40.0000 mg | Freq: Once | INTRAMUSCULAR | Status: AC
  Administered 2021-06-28: 40 mg via INTRAVENOUS
  Filled 2021-06-28: qty 4

## 2021-06-28 NOTE — Progress Notes (Signed)
PROGRESS NOTE    [name]  DVV:[id number] DOB: 04-Mar-1936 DOA: 06/26/2021 PCP: Biagio Borg, MD    Brief Narrative:  [name]  is a 85 y.o female with past medical history of asthma, COPD, obstructive sleep apnea on CPAP, CAD, congestive heart failure, hypertension, sinus node dysfunction, chronic atrial fibrillation on Xarelto, hypothyroidism, peripheral neuropathy, history of breast cancer, obesity who was recently hospitalized for multifocal pneumonia and asthma exacerbation.  Patient was seen by her primary care physician as outpatient on Friday and was started on doxycycline for worsening symptoms including fever and worsening dyspnea.  She was noted to be hypoxic with saturation up to 70% on ambulation despite being on 3 L of oxygen.  Chest x-ray done in the pulmonary clinic showed persistent airspace opacity suggestive of multifocal pneumonia.  Patient was then sent back to the hospital due to worsening dyspnea, hypoxia and was admitted to the hospital for further evaluation and treatment.       Assessment and Plan: * Multifocal pneumonia- (present on admission) Patient presented with worsening shortness of breath especially on minimal exertion.  Recently admitted for pneumonia and has failed outpatient treatment.  Patient had a fever up to 102 F at home but has been afebrile at this time.. Was desaturating up to 70% on 3 L of nasal cannula in the office before she was sent in..  Patient has been started on vancomycin and azactam.MRSA PCR negative.  History of allergy to Keflex but has tolerated in the past.  Communicated with pharmacy about this.  Currently on vancomycin and Rocephin.  Blood cultures negative in less than 24 hours.  Continue IV steroids for now.  Acute respiratory failure with hypoxia ([place])- (present on admission) Likely secondary to pneumonia but patient does have history of COPD and obstructive sleep apnea.  Mild COPD exacerbation as well.  Currently on 4 L of nasal  cannula oxygen, saturations around 96%.  Continue to support.  Continue treatment for pneumonia.  Continue incentive spirometry, flutter valve, bronchodilators.  RT has been consulted.  Concern for aspiration so we will get speech therapy. Will give one dose of IV lasix today  Asthma, chronic obstructive, with acute exacerbation (HCC) Continue bronchodilators, oxygen, incentive spirometry, flutter valve continue IV Solu-Medrol for now.  Wean oxygen as able.  Atrial fibrillation, chronic ([place])- (present on admission) Continue metoprolol and Xarelto from home.  Rate controlled at this time.  OSA on CPAP Continue CPAP at nighttime.  Morbid obesity with BMI of 45.0-49.9, adult ([place]) Continue supportive care.  Anxiety and depression- (present on admission) Continue Xanax, Lexapro, gabapentin from home.  Appears to be stable at this time.  Essential hypertension- (present on admission) On metoprolol from home.  Continue to monitor blood pressure.  Relatively stable at this time  Hypothyroidism- (present on admission) Continue Synthroid from home.  Most recent TSH of 7.4  Hypokalemia- (present on admission) Improved with replacement.  Potassium 4.3 today    DVT prophylaxis:  rivaroxaban (XARELTO) tablet 20 mg   Code Status:     Code Status: Full Code  Disposition: Home with home health  Status is: Inpatient  Remains inpatient appropriate because: Multifocal pneumonia, respiratory failure,, IV antibiotic   Family Communication:  Spoke with the patient's daughter on the phone and updated her about the clinical condition of the patient.  Consultants:  None  Procedures:  None  Antimicrobials:  Vancomycin Rocephin  Anti-infectives (From admission, onward)    Start     Dose/Rate Route  Frequency Ordered Stop   06/28/21 0000  vancomycin (VANCOREADY) IVPB 1750 mg/350 mL        1,750 mg 175 mL/hr over 120 Minutes Intravenous Every 36 hours 06/26/21 2345     06/27/21 1200   cefTRIAXone (ROCEPHIN) 2 g in sodium chloride 0.9 % 100 mL IVPB        2 g 200 mL/hr over 30 Minutes Intravenous Every 24 hours 06/27/21 1039     06/26/21 1245  vancomycin (VANCOREADY) IVPB 2000 mg/400 mL        2,000 mg 200 mL/hr over 120 Minutes Intravenous  Once 06/26/21 1234 06/26/21 1420   06/26/21 1230  aztreonam (AZACTAM) 2 g in sodium chloride 0.9 % 100 mL IVPB        2 g 200 mL/hr over 30 Minutes Intravenous  Once 06/26/21 1229 06/26/21 1911       Subjective: Today, patient was seen and examined at bedside. Patient stated that she feels a little better today.  Mild cough.  Denies any chest pain, nausea vomiting fever chills.    Objective: Vitals:   06/27/21 2215 06/28/21 0027 06/28/21 0459 06/28/21 1012  BP:  (!) 149/84 (!) 134/93   Pulse:  80 80   Resp:  20 20   Temp:  97.7 F (36.5 C) 97.8 F (36.6 C)   TempSrc:  Oral Oral   SpO2: 94% 99% 93% 92%  Weight:      Height:        Intake/Output Summary (Last 24 hours) at 06/28/2021 1050 Last data filed at 06/28/2021 0931 Gross per 24 hour  Intake 180 ml  Output 2250 ml  Net -2070 ml   Filed Weights   06/26/21 1159  Weight: 117.5 kg    Physical Examination: General:  Obese, not in obvious distress, mildly anxious, on nasal cannula oxygen HENT:   No scleral pallor or icterus noted. Oral mucosa is moist.  Chest: Diminished breath sounds bilaterally.  Coarse breath sounds noted with mild wheezes.   CVS: S1 &S2 heard. No murmur.  Regular rate and rhythm. Abdomen: Soft, nontender, nondistended.  Bowel sounds are heard.   Extremities: No cyanosis, clubbing trace peripheral edema..  Peripheral pulses are palpable. Psych: Alert, awake and oriented, normal mood CNS:  No cranial nerve deficits.  Moving all extremities. Skin: Warm and dry.  No rashes noted.  Data Reviewed:   CBC: Recent Labs  Lab 06/26/21 1327 06/27/21 0406 06/28/21 0505  WBC 13.5* 6.8 10.5  NEUTROABS 10.9*  --   --   HGB 12.0 10.5* 10.6*  HCT  36.5 33.1* 33.8*  MCV 99.7 99.7 100.6*  PLT 192 173 774    Basic Metabolic Panel: Recent Labs  Lab 06/26/21 1540 06/27/21 0406 06/28/21 0505  NA 133* 133* 135  K 3.8 3.4* 4.3  CL 100 102 104  CO2 26 25 27   GLUCOSE 161* 175* 158*  BUN 22 25* 24*  CREATININE 0.95 1.00 0.82  CALCIUM 8.8* 8.7* 8.9  MG  --   --  2.4    Liver Function Tests: Recent Labs  Lab 06/26/21 1540 06/27/21 0406  AST 31 24  ALT 12 15  ALKPHOS 47 48  BILITOT 2.2* 1.1  PROT 6.6 6.7  ALBUMIN 3.1* 3.2*     Radiology Studies: No results found.    LOS: 2 days    Flora Lipps, MD [facility] 06/28/2021, 10:50 AM

## 2021-06-29 ENCOUNTER — Inpatient Hospital Stay (HOSPITAL_COMMUNITY): Payer: Medicare Other

## 2021-06-29 DIAGNOSIS — R5381 Other malaise: Secondary | ICD-10-CM

## 2021-06-29 LAB — BASIC METABOLIC PANEL
Anion gap: 5 (ref 5–15)
BUN: 32 mg/dL — ABNORMAL HIGH (ref 8–23)
CO2: 30 mmol/L (ref 22–32)
Calcium: 9.2 mg/dL (ref 8.9–10.3)
Chloride: 100 mmol/L (ref 98–111)
Creatinine, Ser: 0.85 mg/dL (ref 0.44–1.00)
GFR, Estimated: >60 mL/min (ref >60)
Glucose, Bld: 167 mg/dL — ABNORMAL HIGH (ref 70–99)
Potassium: 3.9 mmol/L (ref 3.5–5.1)
Sodium: 135 mmol/L (ref 135–145)

## 2021-06-29 LAB — CBC
HCT: 34.8 % — ABNORMAL LOW (ref 36.0–46.0)
Hemoglobin: 11.1 g/dL — ABNORMAL LOW (ref 12.0–15.0)
MCH: 31.7 pg (ref 26.0–34.0)
MCHC: 31.9 g/dL (ref 30.0–36.0)
MCV: 99.4 fL (ref 80.0–100.0)
Platelets: 202 10*3/uL (ref 150–400)
RBC: 3.5 MIL/uL — ABNORMAL LOW (ref 3.87–5.11)
RDW: 14.3 % (ref 11.5–15.5)
WBC: 10.7 10*3/uL — ABNORMAL HIGH (ref 4.0–10.5)
nRBC: 0 % (ref 0.0–0.2)

## 2021-06-29 LAB — MAGNESIUM: Magnesium: 2.4 mg/dL (ref 1.7–2.4)

## 2021-06-29 MED ORDER — HYDROCHLOROTHIAZIDE 25 MG PO TABS
50.0000 mg | ORAL_TABLET | Freq: Every day | ORAL | Status: DC
Start: 2021-06-29 — End: 2021-07-01
  Administered 2021-06-29 – 2021-07-01 (×3): 50 mg via ORAL
  Filled 2021-06-29 (×3): qty 2

## 2021-06-29 MED ORDER — FUROSEMIDE 10 MG/ML IJ SOLN
20.0000 mg | Freq: Once | INTRAMUSCULAR | Status: AC
  Administered 2021-06-29: 20 mg via INTRAVENOUS
  Filled 2021-06-29: qty 2

## 2021-06-29 MED ORDER — OXYMETAZOLINE HCL 0.05 % NA SOLN
1.0000 | Freq: Two times a day (BID) | NASAL | Status: DC
  Administered 2021-06-29 – 2021-07-01 (×5): 1 via NASAL
  Filled 2021-06-29 (×2): qty 15

## 2021-06-29 MED ORDER — FLUTICASONE PROPIONATE 50 MCG/ACT NA SUSP
2.0000 | Freq: Every day | NASAL | Status: DC
  Administered 2021-06-29 – 2021-07-01 (×3): 2 via NASAL
  Filled 2021-06-29: qty 16

## 2021-06-29 NOTE — Progress Notes (Signed)
Around 0140 pt reported that she choked on her tea and panicked.   Pt watery cough continued with RN and nursing assistant at bedside. CPAP remains in place. PT O2 was 93%. Respiratory was  called and arrived to room for assistance.   Pt was able to clear her airway by coughing and deep breathing.   Pt was able to calm down, room door left cracked open to help comfort pt. Call bell within reach, bed locked in lower position. All tubing moved in position so that it will not hinder pt movement.    Pt educated on sitting up high and chin tucks to swallow fluid/food/pills.  Speech consult has already been placed.

## 2021-06-29 NOTE — Evaluation (Signed)
Physical Therapy Evaluation Patient Details Name: [name] MRN: [id number] DOB: 1935/06/22 Today's Date: 06/29/2021  History of Present Illness  86 y.o female with past medical history of asthma, COPD, obstructive sleep apnea on CPAP, CAD, congestive heart failure, hypertension, sinus node dysfunction, chronic atrial fibrillation on Xarelto, hypothyroidism, peripheral neuropathy, history of breast cancer, obesity who was recently hospitalized 06/12/21-06/16/21 for multifocal pneumonia and asthma exacerbation.  Pt now readmitted 06/26/21 for worsening dyspnea and hypoxia. Dx of PNA.  Clinical Impression  Pt admitted with above diagnosis. Pt ambulated 100' with RW, SaO2 88% on 4L O2 walking, distance limited by 3/4 dyspnea.  Pt currently with functional limitations due to the deficits listed below (see PT Problem List). Pt will benefit from skilled PT to increase their independence and safety with mobility to allow discharge to the venue listed below.          Recommendations for follow up therapy are one component of a multi-disciplinary discharge planning process, led by the attending physician.  Recommendations may be updated based on patient status, additional functional criteria and insurance authorization.  Follow Up Recommendations Home health PT    Assistance Recommended at Discharge Intermittent Supervision/Assistance  Patient can return home with the following  Assist for transportation;A little help with bathing/dressing/bathroom;Assistance with cooking/housework;Help with stairs or ramp for entrance    Equipment Recommendations None recommended by PT  Recommendations for Other Services       Functional Status Assessment Patient has had a recent decline in their functional status and demonstrates the ability to make significant improvements in function in a reasonable and predictable amount of time.     Precautions / Restrictions Precautions Precautions: Fall Precaution  Comments: monitor O2 sats Restrictions Weight Bearing Restrictions: No      Mobility  Bed Mobility Overal bed mobility: Modified Independent             General bed mobility comments: HOB up    Transfers Overall transfer level: Needs assistance Equipment used: Rolling walker (2 wheels) Transfers: Sit to/from Stand Sit to Stand: Min guard           General transfer comment: VCs hand placement    Ambulation/Gait Ambulation/Gait assistance: Min guard Gait Distance (Feet): 100 Feet Assistive device: Rolling walker (2 wheels) Gait Pattern/deviations: Step-through pattern, Decreased stride length Gait velocity: decr     General Gait Details: SpO2 88% on 4L O2 while walking, 3/4 dyspnea, VCs pursed lip breathing, no loss of balance  Stairs            Wheelchair Mobility    Modified Rankin (Stroke Patients Only)       Balance Overall balance assessment: Modified Independent                                           Pertinent Vitals/Pain Pain Assessment Pain Assessment: No/denies pain    Home Living Family/patient expects to be discharged to:: Private residence Living Arrangements: Alone Available Help at Discharge: Family Type of Home: House Home Access: Ramped entrance     Alternate Level Stairs-Number of Steps: 3 Home Layout: Two level Home Equipment: Rollator (4 wheels);Rolling Walker (2 wheels);Grab bars - tub/shower;Grab bars - toilet;Shower seat - built in Additional Comments: 2 daughters are local and can assist but not 24/7    Prior Function Prior Level of Function : Independent/Modified Independent  Mobility Comments: walks with rollator ADLs Comments: assist for housecleaning, independent bathing/dressing, family is nearby and assists with driving, pt doesn't drive     Hand Dominance   Dominant Hand: Right    Extremity/Trunk Assessment   Upper Extremity Assessment Upper Extremity Assessment:  Overall WFL for tasks assessed    Lower Extremity Assessment Lower Extremity Assessment: Overall WFL for tasks assessed    Cervical / Trunk Assessment Cervical / Trunk Assessment: Normal  Communication   Communication: No difficulties  Cognition Arousal/Alertness: Awake/alert Behavior During Therapy: WFL for tasks assessed/performed Overall Cognitive Status: Within Functional Limits for tasks assessed                                          General Comments      Exercises     Assessment/Plan    PT Assessment Patient needs continued PT services  PT Problem List Cardiopulmonary status limiting activity;Decreased activity tolerance;Decreased mobility       PT Treatment Interventions Gait training;Therapeutic exercise;Therapeutic activities;Functional mobility training;Patient/family education    PT Goals (Current goals can be found in the Care Plan section)  Acute Rehab PT Goals Patient Stated Goal: return home PT Goal Formulation: With patient Time For Goal Achievement: 06/27/21 Potential to Achieve Goals: Good    Frequency Min 3X/week     Co-evaluation               AM-PAC PT "6 Clicks" Mobility  Outcome Measure Help needed turning from your back to your side while in a flat bed without using bedrails?: None Help needed moving from lying on your back to sitting on the side of a flat bed without using bedrails?: A Little Help needed moving to and from a bed to a chair (including a wheelchair)?: A Little Help needed standing up from a chair using your arms (e.g., wheelchair or bedside chair)?: A Little Help needed to walk in hospital room?: A Little Help needed climbing 3-5 steps with a railing? : A Lot 6 Click Score: 18    End of Session Equipment Utilized During Treatment: Oxygen;Gait belt Activity Tolerance: Patient tolerated treatment well Patient left: in bed;with nursing/sitter in room;with call bell/phone within reach;with bed  alarm set Nurse Communication: Mobility status PT Visit Diagnosis: Difficulty in walking, not elsewhere classified (R26.2)    Time: 1355-1420 PT Time Calculation (min) (ACUTE ONLY): 25 min   Charges:   PT Evaluation $PT Eval Moderate Complexity: 1 Mod PT Treatments $Gait Training: 8-22 mins       Blondell Reveal Kistler PT 06/29/2021  Acute Rehabilitation Services Pager [phone] Office [phone]

## 2021-06-29 NOTE — Progress Notes (Signed)
PROGRESS NOTE    RUTHELLA KIRCHMAN  FKC:[id number] DOB: 07-24-1935 DOA: 06/26/2021 PCP: Biagio Borg, MD    Brief Narrative:  [name]  is a 86 y.o female with past medical history of asthma, COPD, obstructive sleep apnea on CPAP, CAD, congestive heart failure, hypertension, sinus node dysfunction, chronic atrial fibrillation on Xarelto, hypothyroidism, peripheral neuropathy, history of breast cancer, obesity who was recently hospitalized for multifocal pneumonia and asthma exacerbation.  Patient was seen by her primary care physician as outpatient on Friday and was started on doxycycline for worsening symptoms including fever and worsening dyspnea.  She was noted to be hypoxic with saturation up to 70% on ambulation despite being on 3 L of oxygen.  Chest x-ray done in the pulmonary clinic showed persistent airspace opacity suggestive of multifocal pneumonia.  Patient was then sent back to the hospital due to worsening dyspnea, hypoxia and was admitted to the hospital for further evaluation and treatment.  Patient was then started on broad-spectrum antibiotic     Assessment and Plan: * Multifocal pneumonia- (present on admission) Failed outpatient treatment.  Afebrile at this time.  WBC at 10.7.  Continue Rocephin for now.  Discontinue vancomycin since MRSA PCR negative. Blood cultures negative in 2 days.  Continue IV steroid for now.  Acute respiratory failure with hypoxia ([place])- (present on admission) Likely secondary to pneumonia but patient does have history of asthma and obstructive sleep apnea.  COPD as per the family.  Currently on 4 L of nasal cannula oxygen, saturations around 92%.  Continue incentive spirometry, flutter valve, bronchodilators, IV antibiotic.  Concern for aspiration so speech therapy has been consulted.  Received 1 dose of IV Lasix yesterday. Negative balance for 2225 mL.  We will give 1 dose of 20 mg of IV Lasix again today.  2D echocardiogram on 06/23/2021 showed LV ejection  fraction of 60 to 65%.  Asthma, chronic obstructive, with acute exacerbation (HCC) Continue bronchodilators, oxygen, incentive spirometry, flutter valve continue IV Solu-Medrol for now.  Wean oxygen as able.  Atrial fibrillation, chronic ([place])- (present on admission) Continue metoprolol and Xarelto from home.  Rate controlled at this time.  Debility Physical deconditioning.  We will get PT evaluation.  Patient is skeptical about going home but does not want to go to skilled nursing facility.  We will get PT recommendation.  OSA on CPAP Continue CPAP at nighttime.  Morbid obesity with BMI of 45.0-49.9, adult ([place]) Continue supportive care.  Anxiety and depression- (present on admission) Continue Xanax, Lexapro, gabapentin from home.  Appears to be stable at this time.  Essential hypertension- (present on admission) On metoprolol from home.  Continue to monitor blood pressure.  Blood pressure slightly elevated this morning.  Resume HCTZ from home  Hypothyroidism- (present on admission) Continue Synthroid from home.  Most recent TSH of 7.4  Hypokalemia- (present on admission) Improved with replacement.  Potassium 3.9 today    DVT prophylaxis:  rivaroxaban (XARELTO) tablet 20 mg   Code Status:     Code Status: Full Code  Disposition: Home with home health likely.  We will get PT evaluation.  Status is: Inpatient  Remains inpatient appropriate because: Multifocal pneumonia, respiratory failure,, IV antibiotic   Family Communication:  Spoke with the patient's daughter on the phone on 06/27/2021  Consultants:  None  Procedures:  None  Antimicrobials:  Rocephin  Anti-infectives (From admission, onward)    Start     Dose/Rate Route Frequency Ordered Stop   06/28/21 0000  vancomycin (  VANCOREADY) IVPB 1750 mg/350 mL  Status:  Discontinued        1,750 mg 175 mL/hr over 120 Minutes Intravenous Every 36 hours 06/26/21 2345 06/29/21 0736   06/27/21 1200  cefTRIAXone  (ROCEPHIN) 2 g in sodium chloride 0.9 % 100 mL IVPB        2 g 200 mL/hr over 30 Minutes Intravenous Every 24 hours 06/27/21 1039     06/26/21 1245  vancomycin (VANCOREADY) IVPB 2000 mg/400 mL        2,000 mg 200 mL/hr over 120 Minutes Intravenous  Once 06/26/21 1234 06/26/21 1420   06/26/21 1230  aztreonam (AZACTAM) 2 g in sodium chloride 0.9 % 100 mL IVPB        2 g 200 mL/hr over 30 Minutes Intravenous  Once 06/26/21 1229 06/26/21 1911       Subjective: Today, patient was seen and examined at bedside.  Has shortness of breath on exertion.  Has any fever, chills, chest pain, increasing cough.  Concerned about coughing during eating.  Complains of generalized weakness.  Complains of stuffiness of the right ear.  objective: Vitals:   06/28/21 1012 06/28/21 1334 06/28/21 2136 06/29/21 0639  BP:  133/66 (!) 146/92 (!) 169/107  Pulse:  85 87 65  Resp:  (!) 22 17 18   Temp:  98.2 F (36.8 C) 98.1 F (36.7 C) 97.6 F (36.4 C)  TempSrc:  Oral Oral Oral  SpO2: 92% 98% 92% 97%  Weight:      Height:        Intake/Output Summary (Last 24 hours) at 06/29/2021 1024 Last data filed at 06/28/2021 1500 Gross per 24 hour  Intake 691.08 ml  Output 1000 ml  Net -308.92 ml   Filed Weights   06/26/21 1159  Weight: 117.5 kg    Physical Examination: General: Obese built, not in obvious distress, anxious, on nasal cannula oxygen HENT:   No scleral pallor or icterus noted. Oral mucosa is moist.  Chest:    Diminished breath sounds bilaterally.  Coarse breath sounds noted. CVS: S1 &S2 heard. No murmur.  Regular rate and rhythm. Abdomen: Soft, nontender, nondistended.  Bowel sounds are heard.   Extremities: No cyanosis, clubbing with trace peripheral edema.  Peripheral pulses are palpable. Psych: Alert, awake and oriented, normal mood CNS:  No cranial nerve deficits.  Moving all extremities. Skin: Warm and dry.  No rashes noted.  Data Reviewed:   CBC: Recent Labs  Lab 06/26/21 1327  06/27/21 0406 06/28/21 0505 06/29/21 0505  WBC 13.5* 6.8 10.5 10.7*  NEUTROABS 10.9*  --   --   --   HGB 12.0 10.5* 10.6* 11.1*  HCT 36.5 33.1* 33.8* 34.8*  MCV 99.7 99.7 100.6* 99.4  PLT 192 173 188 449    Basic Metabolic Panel: Recent Labs  Lab 06/26/21 1540 06/27/21 0406 06/28/21 0505 06/29/21 0505  NA 133* 133* 135 135  K 3.8 3.4* 4.3 3.9  CL 100 102 104 100  CO2 26 25 27 30   GLUCOSE 161* 175* 158* 167*  BUN 22 25* 24* 32*  CREATININE 0.95 1.00 0.82 0.85  CALCIUM 8.8* 8.7* 8.9 9.2  MG  --   --  2.4 2.4    Liver Function Tests: Recent Labs  Lab 06/26/21 1540 06/27/21 0406  AST 31 24  ALT 12 15  ALKPHOS 47 48  BILITOT 2.2* 1.1  PROT 6.6 6.7  ALBUMIN 3.1* 3.2*     Radiology Studies: No results found.  LOS: 3 days    Flora Lipps, MD [facility] 06/29/2021, 10:24 AM

## 2021-06-29 NOTE — Assessment & Plan Note (Addendum)
Physical deconditioning.   PT recommendation at this time is home health on discharge.Marland Kitchen

## 2021-06-29 NOTE — Care Management Important Message (Signed)
Important Message  Patient Details IM Letter given to the Patient. Name: [name] MRN: [id number] Date of Birth: 10-02-1935   Medicare Important Message Given:  Yes     Kerin Salen 06/29/2021, 12:15 PM

## 2021-06-29 NOTE — Progress Notes (Signed)
Speech Language Pathology Treatment: Dysphagia  Patient Details Name: [name] MRN: [id number] DOB: 08-Nov-1935 Today's Date: 06/29/2021 Time: 1505-6979 SLP Time Calculation (min) (ACUTE ONLY): 30 min  Assessment / Plan / Recommendation Clinical Impression  SLP follow up to address dysphagia goals and attempt to clinically differentiate dysphagia source. Pt alert, willing to accept intake and is now off Cpap.  SLP administered 3 ounce [place], which pt easily passed.  Consumption of thin and cereal/milk observed - no indication of aspiration but pt does report sensing that "it's not going down" and "staying in the throat".  She reports having to "think about swallowing" - ? if pt had . When drinking liquids sequentially, she is not exhibiting effort with swallowing.  And no indication of difficulties with cereal consumption.  Episodic presentation noted.  Pt admits she may have been reclined when "choking" episodes occur - but SLP also questions UES dysfunction.  Advised pt to precautions using teach back and pt needing mod cues to cease talking with intake. She also uses Xanax, causing SLP to question if this could contribute to reflux issues. Recommend consider esophagram to r/o esophageal dysphagia/reflux.    Pt agreeable to plan and order obtained from Md. Advised pt to be NPO pending ascertaining time of exam. Thanks   HPI HPI: [name]  is a 86 y.o female with past medical history of asthma, COPD, obstructive sleep apnea on CPAP, CAD, congestive heart failure, hypertension, sinus node dysfunction, chronic atrial fibrillation on Xarelto, hypothyroidism, peripheral neuropathy, history of breast cancer, obesity who was recently hospitalized for multifocal pneumonia and asthma exacerbation.  Patient was seen by her primary care physician as outpatient on Friday and was started on doxycycline for worsening symptoms including fever and worsening dyspnea.      SLP Plan  Other (Comment);Continue with  current plan of care      Recommendations for follow up therapy are one component of a multi-disciplinary discharge planning process, led by the attending physician.  Recommendations may be updated based on patient status, additional functional criteria and insurance authorization.    Recommendations  Diet recommendations: Regular;Thin liquid Liquids provided via: Cup;Straw Medication Administration: Whole meds with liquid Compensations: Slow rate;Small sips/bites Postural Changes and/or Swallow Maneuvers: Seated upright 90 degrees;Upright 30-60 min after meal                Oral Care Recommendations: Oral care BID Follow Up Recommendations: Other (comment) Assistance recommended at discharge: PRN SLP Visit Diagnosis: Dysphagia, unspecified (R13.10) Plan: Other (Comment);Continue with current plan of care         Kathleen Lime, MS [place] Office [phone] Pager [phone]   Martin, Smeal  06/29/2021, 10:41 AM

## 2021-06-29 NOTE — Evaluation (Signed)
Clinical/Bedside Swallow Evaluation Patient Details  Name: IYSIS GERMAIN MRN: [id number] Date of Birth: 25-May-1935  Today's Date: 06/29/2021 Time: SLP Start Time (ACUTE ONLY): 0801 SLP Stop Time (ACUTE ONLY): 0819 SLP Time Calculation (min) (ACUTE ONLY): 18 min  Past Medical History:  Past Medical History:  Diagnosis Date   Allergy    Anxiety    Arthritis    no cartlidge in knees   Asthma    Bradycardia    a. atenolol stopped due to this, HR 40s.   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  CAD s/p RCA stent 2000 with residual mild-mod disease of left system 2001.   Chronic atrial fibrillation (HCC)    COPD (chronic obstructive pulmonary disease) ([place])    Essential hypertension 03/24/2015   Gout    post operative   Hx of radiation therapy 04/02/11 -05/20/11   left breast   Hyperlipidemia    Hypothyroidism 03/24/2015   Incontinence of urine    Malignant neoplasm of upper-outer quadrant of female breast ([place]) 12/26/2010   Osteoporosis 03/24/2015   Peripheral neuropathy 03/24/2015   Plantar fasciitis    Sleep apnea    Past Surgical History:  Past Surgical History:  Procedure Laterality Date   BREAST LUMPECTOMY W/ NEEDLE LOCALIZATION  01/29/2011   Left with SLN Dr Margot Chimes   CHOLECYSTECTOMY  1955   CORONARY ANGIOPLASTY WITH STENT PLACEMENT  2000   DILATION AND CURETTAGE OF UTERUS  1976   and [place]   tendons between thumb and forefinger   SKIN BIOPSY     1994, 2006, 2008, 2010, 2011, 2011, 2012-  pre-cancerous   TONSILLECTOMY  1955   HPI:       Assessment / Plan / Recommendation  Clinical Impression  Pt eating breakfast upon SLP entrance to room with CPAP in place- which SLP advised against due to continuous positive airway pressure elevating aspiration risk. No focal CN deficits and pt denies h/o dysphagia/GERD.  Pt does admit that yesterday she sensed liquid staying in throat and then "bubbling up" causing  her to explosively cough.  Voice is clear and cough is strong fortunately. Today pt observed consuming grape using caution. Post swallow she demonstrated immediate sensatin of stasis and coughing concerning for airway infiltration.   Given h/o breast cancer treatment with XRT - ? if pt may have component of esophageal dysphagia. SLP Visit Diagnosis: Dysphagia, unspecified (R13.10)    Aspiration Risk  Mild aspiration risk    Diet Recommendation Thin liquid;Regular   Liquid Administration via: Cup Medication Administration: Whole meds with liquid Supervision: Patient able to self feed Compensations: Slow rate;Small sips/bites Postural Changes: Seated upright at 90 degrees;Remain upright for at least 30 minutes after po intake    Other  Recommendations Oral Care Recommendations: Oral care BID    Recommendations for follow up therapy are one component of a multi-disciplinary discharge planning process, led by the attending physician.  Recommendations may be updated based on patient status, additional functional criteria and insurance authorization.  Follow up Recommendations Other (comment)      Assistance Recommended at Discharge PRN  Functional Status Assessment Patient has had a recent decline in their functional status and demonstrates the ability to make significant improvements in function in a reasonable and predictable amount of time.  Frequency and Duration min 1 x/week  1 week       Prognosis Prognosis  for Safe Diet Advancement: Fair      Swallow Study   General   Floy Angert  is a 86 y.o female with past medical history of asthma, COPD, obstructive sleep apnea on CPAP, CAD, congestive heart failure, hypertension, sinus node dysfunction, chronic atrial fibrillation on Xarelto, hypothyroidism, peripheral neuropathy, history of breast cancer, obesity who was recently hospitalized for multifocal pneumonia and asthma exacerbation.  Patient was seen by her primary care physician as  outpatient on Friday and was started on doxycycline for worsening symptoms including fever and worsening dyspnea.   Oral/Motor/Sensory Function Overall Oral Motor/Sensory Function: Within functional limits   Ice Chips Ice chips: Not tested   Thin Liquid Thin Liquid: Within functional limits Presentation: Cup    Nectar Thick Nectar Thick Liquid: Not tested   Honey Thick Honey Thick Liquid: Not tested   Puree Puree: Not tested   Solid     Solid: Impaired Presentation: Self Fed Pharyngeal Phase Impairments: Multiple swallows;Cough - Immediate Other Comments: pt sensed retention at pharynx - requiring extra swallows - delayed cough noted      Ileane, Sando 06/29/2021,10:25 AM  Kathleen Lime, MS [place] Office [phone] Pager [phone]

## 2021-06-30 LAB — CBC
HCT: 36 % (ref 36.0–46.0)
Hemoglobin: 11.6 g/dL — ABNORMAL LOW (ref 12.0–15.0)
MCH: 31.7 pg (ref 26.0–34.0)
MCHC: 32.2 g/dL (ref 30.0–36.0)
MCV: 98.4 fL (ref 80.0–100.0)
Platelets: 203 10*3/uL (ref 150–400)
RBC: 3.66 MIL/uL — ABNORMAL LOW (ref 3.87–5.11)
RDW: 14 % (ref 11.5–15.5)
WBC: 7.6 10*3/uL (ref 4.0–10.5)
nRBC: 0 % (ref 0.0–0.2)

## 2021-06-30 LAB — BASIC METABOLIC PANEL
Anion gap: 10 (ref 5–15)
BUN: 30 mg/dL — ABNORMAL HIGH (ref 8–23)
CO2: 27 mmol/L (ref 22–32)
Calcium: 9 mg/dL (ref 8.9–10.3)
Chloride: 96 mmol/L — ABNORMAL LOW (ref 98–111)
Creatinine, Ser: 0.83 mg/dL (ref 0.44–1.00)
GFR, Estimated: >60 mL/min (ref >60)
Glucose, Bld: 197 mg/dL — ABNORMAL HIGH (ref 70–99)
Potassium: 3.5 mmol/L (ref 3.5–5.1)
Sodium: 133 mmol/L — ABNORMAL LOW (ref 135–145)

## 2021-06-30 LAB — MAGNESIUM: Magnesium: 2.3 mg/dL (ref 1.7–2.4)

## 2021-06-30 LAB — PHOSPHORUS: Phosphorus: 3 mg/dL (ref 2.5–4.6)

## 2021-06-30 MED ORDER — LORATADINE 10 MG PO TABS
10.0000 mg | ORAL_TABLET | Freq: Every evening | ORAL | Status: DC
  Administered 2021-06-30: 10 mg via ORAL
  Filled 2021-06-30: qty 1

## 2021-06-30 MED ORDER — EPINEPHRINE 0.3 MG/0.3ML IJ SOAJ: 0.3000 mg | INTRAMUSCULAR | Status: DC | PRN

## 2021-06-30 MED ORDER — EZETIMIBE 10 MG PO TABS: 10.0000 mg | ORAL_TABLET | ORAL | Status: DC

## 2021-06-30 MED ORDER — CYCLOSPORINE 0.05 % OP EMUL
1.0000 [drp] | Freq: Two times a day (BID) | OPHTHALMIC | Status: DC | PRN
  Filled 2021-06-30: qty 30

## 2021-06-30 MED ORDER — VITAMIN D 25 MCG (1000 UNIT) PO TABS
1000.0000 [IU] | ORAL_TABLET | Freq: Every day | ORAL | Status: DC
  Administered 2021-06-30 – 2021-07-01 (×2): 1000 [IU] via ORAL
  Filled 2021-06-30 (×2): qty 1

## 2021-06-30 MED ORDER — DM-GUAIFENESIN ER 30-600 MG PO TB12
1.0000 | ORAL_TABLET | Freq: Two times a day (BID) | ORAL | Status: DC
  Administered 2021-06-30 – 2021-07-01 (×3): 1 via ORAL
  Filled 2021-06-30 (×3): qty 1

## 2021-06-30 MED ORDER — POTASSIUM CHLORIDE CRYS ER 20 MEQ PO TBCR
40.0000 meq | EXTENDED_RELEASE_TABLET | Freq: Once | ORAL | Status: AC
  Administered 2021-06-30: 40 meq via ORAL
  Filled 2021-06-30: qty 2

## 2021-06-30 MED ORDER — MONTELUKAST SODIUM 10 MG PO TABS
10.0000 mg | ORAL_TABLET | Freq: Every day | ORAL | Status: DC
  Administered 2021-06-30: 10 mg via ORAL
  Filled 2021-06-30: qty 1

## 2021-06-30 MED ORDER — AZELASTINE HCL 0.1 % NA SOLN
2.0000 | Freq: Every day | NASAL | Status: DC
  Administered 2021-07-01: 2 via NASAL
  Filled 2021-06-30: qty 30

## 2021-06-30 NOTE — Plan of Care (Signed)

## 2021-06-30 NOTE — Progress Notes (Signed)
PROGRESS NOTE    [name]  OBS:[id number] DOB: 18-Feb-1936 DOA: 06/26/2021 PCP: Biagio Borg, MD    Brief Narrative:  [name]  is a 86 y.o female with past medical history of asthma, COPD, obstructive sleep apnea on CPAP, CAD, congestive heart failure, hypertension, sinus node dysfunction, chronic atrial fibrillation on Xarelto, hypothyroidism, peripheral neuropathy, history of breast cancer, obesity with recent hospitalization for multifocal pneumonia and asthma exacerbation presented to the outpatient clinic with worsening dyspnea and fever.  She was also noted to be hypoxic saturation up to 70% on ambulation despite being on 3 L of oxygen.  Chest x-ray done  at the clinic showed persistent airspace opacity suggestive of multifocal pneumonia.  Patient was then sent back to the hospital due to worsening dyspnea, hypoxia.     Assessment and Plan: * Multifocal pneumonia- (present on admission) Failed outpatient treatment.  Continue Rocephin.  Blood cultures negative so far.  Afebrile.  No leukocytosis.  Gradually improving.    Acute respiratory failure with hypoxia ([place])- (present on admission) Likely multifactorial secondary to pneumonia but patient does have history of asthma and obstructive sleep apnea.  No history of COPD as per the family. Continue incentive spirometry, flutter valve, bronchodilators, IV antibiotic.  Concern for aspiration so speech therapy has been consulted has been recommended regular consistency diet..  Negative balance for 4385 after IV Lasix x 2.  2D echocardiogram on 06/23/2021 showed LV ejection fraction of 60 to 65%.  Patient has been using 3 L of oxygen at home recently  Asthma, chronic obstructive, with acute exacerbation ([place])- (present on admission) Continue bronchodilators, oxygen, incentive spirometry, flutter valve. Continue IV Solu-Medrol for now.  Wean oxygen as able.  Consider p.o. prednisone by tomorrow  Atrial fibrillation, chronic ([place])- (present on  admission) Continue metoprolol and Xarelto from home.  Rate controlled at this time.  Debility Physical deconditioning.   PT recommendation at this time is home health on discharge..  OSA on CPAP Continue CPAP at nighttime.  Morbid obesity with BMI of 45.0-49.9, adult ([place]) Continue supportive care.  Anxiety and depression- (present on admission) Continue Xanax, Lexapro, gabapentin from home.  Appears to be stable at this time.  Essential hypertension- (present on admission) On metoprolol and HCTZ from home.  We will closely monitor  Hypothyroidism- (present on admission) Continue Synthroid from home.  Most recent TSH of 7.4  Hypokalemia- (present on admission) Improved with replacement.  Potassium 3.5 today.  We will add 40 mEq of p.o. potassium today    DVT prophylaxis:  rivaroxaban (XARELTO) tablet 20 mg   Code Status:     Code Status: Full Code  Disposition: Home with home health as per home health  Status is: Inpatient  Remains inpatient appropriate because: Multifocal pneumonia, respiratory failure,, IV antibiotic   Family Communication:  Spoke with the patient's daughter on the phone on 06/27/2021, I tried to reach her today but was unable to reach her.  Consultants:  None  Procedures:  None  Antimicrobials:  Rocephin  Anti-infectives (From admission, onward)    Start     Dose/Rate Route Frequency Ordered Stop   06/28/21 0000  vancomycin (VANCOREADY) IVPB 1750 mg/350 mL  Status:  Discontinued        1,750 mg 175 mL/hr over 120 Minutes Intravenous Every 36 hours 06/26/21 2345 06/29/21 0736   06/27/21 1200  cefTRIAXone (ROCEPHIN) 2 g in sodium chloride 0.9 % 100 mL IVPB        2 g 200  mL/hr over 30 Minutes Intravenous Every 24 hours 06/27/21 1039     06/26/21 1245  vancomycin (VANCOREADY) IVPB 2000 mg/400 mL        2,000 mg 200 mL/hr over 120 Minutes Intravenous  Once 06/26/21 1234 06/26/21 1420   06/26/21 1230  aztreonam (AZACTAM) 2 g in sodium  chloride 0.9 % 100 mL IVPB        2 g 200 mL/hr over 30 Minutes Intravenous  Once 06/26/21 1229 06/26/21 1911      Subjective: Today, patient was seen and examined at bedside.  Continues to feel overall better with breathing.  Denies any dyspnea, chest pain but has mild cough.  Still has some fullness in her right ear and some sore throat.  objective: Vitals:   06/29/21 0639 06/29/21 1147 06/29/21 2020 06/30/21 0502  BP: (!) 169/107 (!) 157/94 (!) 150/90 (!) 163/85  Pulse: 65 66 87 81  Resp: 18 19 20 20   Temp: 97.6 F (36.4 C) 97.8 F (36.6 C) 97.6 F (36.4 C) 98.1 F (36.7 C)  TempSrc: Oral Oral Oral Oral  SpO2: 97% 99% 93% 96%  Weight:      Height:        Intake/Output Summary (Last 24 hours) at 06/30/2021 1117 Last data filed at 06/30/2021 0900 Gross per 24 hour  Intake 580 ml  Output 2500 ml  Net -1920 ml   Filed Weights   06/26/21 1159  Weight: 117.5 kg    Physical Examination: General: Obese built, not in obvious distress, on nasal cannula oxygen HENT:   No scleral pallor or icterus noted. Oral mucosa is moist.  Chest:  .  Diminished breath sounds bilaterally.  Coarse breath sounds noted. CVS: S1 &S2 heard. No murmur.  Regular rate and rhythm. Abdomen: Soft, nontender, nondistended.  Bowel sounds are heard.   Extremities: No cyanosis, clubbing with trace peripheral edema.  Peripheral pulses are palpable. Psych: Alert, awake and oriented, normal mood CNS:  No cranial nerve deficits.  Moves all extremities. Skin: Warm and dry.  No rashes noted.  Data Reviewed:   CBC: Recent Labs  Lab 06/26/21 1327 06/27/21 0406 06/28/21 0505 06/29/21 0505 06/30/21 0732  WBC 13.5* 6.8 10.5 10.7* 7.6  NEUTROABS 10.9*  --   --   --   --   HGB 12.0 10.5* 10.6* 11.1* 11.6*  HCT 36.5 33.1* 33.8* 34.8* 36.0  MCV 99.7 99.7 100.6* 99.4 98.4  PLT 192 173 188 202 973    Basic Metabolic Panel: Recent Labs  Lab 06/26/21 1540 06/27/21 0406 06/28/21 0505 06/29/21 0505  06/30/21 0508  NA 133* 133* 135 135 133*  K 3.8 3.4* 4.3 3.9 3.5  CL 100 102 104 100 96*  CO2 26 25 27 30 27   GLUCOSE 161* 175* 158* 167* 197*  BUN 22 25* 24* 32* 30*  CREATININE 0.95 1.00 0.82 0.85 0.83  CALCIUM 8.8* 8.7* 8.9 9.2 9.0  MG  --   --  2.4 2.4 2.3  PHOS  --   --   --   --  3.0    Liver Function Tests: Recent Labs  Lab 06/26/21 1540 06/27/21 0406  AST 31 24  ALT 12 15  ALKPHOS 47 48  BILITOT 2.2* 1.1  PROT 6.6 6.7  ALBUMIN 3.1* 3.2*     Radiology Studies: DG ESOPHAGUS W SINGLE CM (SOL OR THIN BA)  Result Date: 06/29/2021 CLINICAL DATA:  Swallowing difficulty EXAM: ESOPHAGUS/BARIUM SWALLOW/TABLET STUDY TECHNIQUE: Single contrast examination was performed using thin  liquid barium. This exam was performed by Margurite Auerbach, NP, and was supervised and interpreted by Dr. Leonia Reeves. FLUOROSCOPY TIME:  Radiation Exposure Index (as provided by the fluoroscopic device): 15.8 mGy If the device does not provide the exposure index: Fluoroscopy Time:  dictate in minutes and seconds Number of Acquired Images: COMPARISON:  NONE. FINDINGS: Patient limited mobility. Suboptimal exam performed in semi upright position. Swallowing: Appears normal. No vestibular penetration or aspiration seen. Pharynx: Unremarkable. Esophagus: No esophageal stricture or mass. Small esophageal diverticulum just above the GE junction measures 2 cm. Small hiatal hernia. Esophageal motility: Mild dysmotility with delayed initiation of primary stripping wave. Hiatal Hernia: Small Gastroesophageal reflux: None visualized. Ingested 20mm barium tablet: Not administered Other: None. IMPRESSION: 1. Limited mobility exam. 2. No esophageal stricture or mass.  No obstruction. 3. Mild esophageal dysmotility. Electronically Signed   By: Suzy Bouchard M.D.   On: 06/29/2021 15:34      LOS: 4 days    Flora Lipps, MD [facility] 06/30/2021, 11:17 AM

## 2021-06-30 NOTE — Progress Notes (Signed)
Physical Therapy Treatment Patient Details Name: [name] MRN: [id number] DOB: 1935-09-13 Today's Date: 06/30/2021   History of Present Illness 86 y.o female with past medical history of asthma, COPD, obstructive sleep apnea on CPAP, CAD, congestive heart failure, hypertension, sinus node dysfunction, chronic atrial fibrillation on Xarelto, hypothyroidism, peripheral neuropathy, history of breast cancer, obesity who was recently hospitalized 06/12/21-06/16/21 for multifocal pneumonia and asthma exacerbation.  Pt now readmitted 06/26/21 for worsening dyspnea and hypoxia. Dx of PNA.    PT Comments    Pt sleeping upon arrival, wakes easily and motivated to progress mobility. Pt ambulatory for great hallway distance with use of RW, requires pacing and breathing technique cues from PT to maintain spO2 >88%. Pt still requiring 4LO2 during gait. PT to continue to follow, pt progressing well.     Recommendations for follow up therapy are one component of a multi-disciplinary discharge planning process, led by the attending physician.  Recommendations may be updated based on patient status, additional functional criteria and insurance authorization.  Follow Up Recommendations  Home health PT     Assistance Recommended at Discharge Intermittent Supervision/Assistance (from 2 daughters)  Patient can return home with the following Assist for transportation;A little help with bathing/dressing/bathroom;Assistance with cooking/housework;Help with stairs or ramp for entrance   Equipment Recommendations  None recommended by PT    Recommendations for Other Services       Precautions / Restrictions Precautions Precautions: Fall Precaution Comments: monitor O2 sats Restrictions Weight Bearing Restrictions: No     Mobility  Bed Mobility Overal bed mobility: Modified Independent             General bed mobility comments: HOB up    Transfers Overall transfer level: Needs assistance Equipment  used: Rolling walker (2 wheels) Transfers: Sit to/from Stand Sit to Stand: Supervision                Ambulation/Gait Ambulation/Gait assistance: Supervision Gait Distance (Feet): 170 Feet Assistive device: Rolling walker (2 wheels) Gait Pattern/deviations: Step-through pattern, Decreased stride length Gait velocity: decr     General Gait Details: supervision for safety, PT encouraging intermittent standing rest breaks to recover DOE 2/4 and SPO59min observed 86% on 4LO2.   Stairs             Wheelchair Mobility    Modified Rankin (Stroke Patients Only)       Balance Overall balance assessment: Modified Independent                                          Cognition Arousal/Alertness: Awake/alert Behavior During Therapy: WFL for tasks assessed/performed Overall Cognitive Status: Within Functional Limits for tasks assessed                                          Exercises      General Comments        Pertinent Vitals/Pain Pain Assessment Pain Assessment: Faces Faces Pain Scale: No hurt    Home Living                          Prior Function            PT Goals (current goals can now be found in the care plan section) Acute  Rehab PT Goals Patient Stated Goal: return home PT Goal Formulation: With patient Time For Goal Achievement: 06/27/21 Potential to Achieve Goals: Good Progress towards PT goals: Progressing toward goals    Frequency    Min 3X/week      PT Plan Current plan remains appropriate    Co-evaluation              AM-PAC PT "6 Clicks" Mobility   Outcome Measure  Help needed turning from your back to your side while in a flat bed without using bedrails?: None Help needed moving from lying on your back to sitting on the side of a flat bed without using bedrails?: None Help needed moving to and from a bed to a chair (including a wheelchair)?: A Little Help needed  standing up from a chair using your arms (e.g., wheelchair or bedside chair)?: A Little Help needed to walk in hospital room?: A Little Help needed climbing 3-5 steps with a railing? : A Little 6 Click Score: 20    End of Session Equipment Utilized During Treatment: Oxygen Activity Tolerance: Patient tolerated treatment well Patient left: in chair;with chair alarm set;with call bell/phone within reach Nurse Communication: Mobility status PT Visit Diagnosis: Difficulty in walking, not elsewhere classified (R26.2)     Time: 1102-1117 PT Time Calculation (min) (ACUTE ONLY): 24 min  Charges:  $Gait Training: 8-22 mins $Therapeutic Activity: 8-22 mins                     Stacie Glaze, PT DPT Acute Rehabilitation Services Pager [phone]  Office [phone]    Roxine Caddy E Ruffin Pyo 06/30/2021, 3:24 PM

## 2021-07-01 DIAGNOSIS — J45901 Unspecified asthma with (acute) exacerbation: Secondary | ICD-10-CM

## 2021-07-01 DIAGNOSIS — Z9989 Dependence on other enabling machines and devices: Secondary | ICD-10-CM

## 2021-07-01 DIAGNOSIS — G4733 Obstructive sleep apnea (adult) (pediatric): Secondary | ICD-10-CM

## 2021-07-01 DIAGNOSIS — E876 Hypokalemia: Secondary | ICD-10-CM

## 2021-07-01 DIAGNOSIS — R5381 Other malaise: Secondary | ICD-10-CM

## 2021-07-01 LAB — CULTURE, BLOOD (ROUTINE X 2): Culture: NO GROWTH

## 2021-07-01 MED ORDER — PREDNISONE 10 MG PO TABS: ORAL_TABLET | ORAL | 0 refills | Status: DC

## 2021-07-01 MED ORDER — ALBUTEROL SULFATE (2.5 MG/3ML) 0.083% IN NEBU: 2.5000 mg | INHALATION_SOLUTION | Freq: Four times a day (QID) | RESPIRATORY_TRACT | 12 refills | Status: DC | PRN

## 2021-07-01 MED ORDER — MONTELUKAST SODIUM 10 MG PO TABS: 10.0000 mg | ORAL_TABLET | Freq: Every day | ORAL | 1 refills | Status: AC

## 2021-07-01 MED ORDER — MELATONIN 3 MG PO TABS
3.0000 mg | ORAL_TABLET | Freq: Every evening | ORAL | Status: DC | PRN
  Administered 2021-07-01: 3 mg via ORAL
  Filled 2021-07-01: qty 1

## 2021-07-01 NOTE — Discharge Summary (Signed)
Physician Discharge Summary   Patient: [name] MRN: [id number] DOB: 22-Nov-1935  Admit date:     06/26/2021  Discharge date: 07/01/21  Discharge Physician: Corrie Mckusick Laquasia Pincus   PCP: Biagio Borg, MD   Recommendations at discharge:   Follow-up with your primary care physician in 1 week.  Follow-up with pulmonary in 1 to 2 weeks.  Discharge Diagnoses: Principal Problem:   Multifocal pneumonia Active Problems:   Acute respiratory failure with hypoxia (HCC)   Atrial fibrillation, chronic (HCC)   Asthma, chronic obstructive, with acute exacerbation (HCC)   Hypokalemia   Hypothyroidism   Essential hypertension   Anxiety and depression   Morbid obesity with BMI of 45.0-49.9, adult (HCC)   OSA on CPAP   Debility  Resolved Problems:   * No resolved hospital problems. [facility] Course: [name]  is a 86 y.o female with past medical history of asthma, COPD, obstructive sleep apnea on CPAP, CAD, congestive heart failure, hypertension, sinus node dysfunction, chronic atrial fibrillation on Xarelto, hypothyroidism, peripheral neuropathy, history of breast cancer, obesity with recent hospitalization for multifocal pneumonia and asthma exacerbation presented to the outpatient clinic with worsening dyspnea and fever.  She was also noted to be hypoxic saturation up to 70% on ambulation despite being on 3 L of oxygen.  Chest x-ray done at the clinic showed persistent airspace opacity suggestive of multifocal pneumonia.  Patient was then sent back to the hospital due to worsening dyspnea, hypoxia.  Assessment and Plan:  * Multifocal pneumonia- (present on admission) Failed outpatient treatment.  Received Rocephin during hospitalization..  Blood cultures negative so far.  Afebrile.  No leukocytosis.  Patient has significantly improved at this time.  Has completed course of antibiotic.  We will discontinue on discharge.  Acute respiratory failure with hypoxia ([place])- (present on  admission) Likely multifactorial secondary to pneumonia but patient does have history of asthma and obstructive sleep apnea.  No history of COPD as per the family. Continue incentive spirometry on discharge.  Received IV Rocephin during hospitalization.  Concern for aspiration so speech therapy was consulted and has been recommended regular consistency diet.  Negative balance for 5895 ml after IV Lasix x 2.  2D echocardiogram on 06/23/2021 showed LV ejection fraction of 60 to 65%.  Patient has been using 3 L of oxygen at home recently  Asthma, chronic obstructive, with acute exacerbation ([place])- (present on admission) Continue bronchodilators, oxygen, incentive spirometry at home.  Patient will be on prednisone taper on discharge.  Atrial fibrillation, chronic ([place])- (present on admission) Continue metoprolol and Xarelto. Rate controlled at this time.  Debility Physical deconditioning.   PT recommendation at this time is home health on discharge..  OSA on CPAP Continue CPAP at nighttime.  Morbid obesity with BMI of 45.0-49.9, adult ([place]) Continue supportive care.  Anxiety and depression- (present on admission) Continue Xanax, Lexapro, gabapentin  Essential hypertension- (present on admission) On metoprolol and HCTZ , continue on discharge  Hypothyroidism- (present on admission) Continue Synthroid from home.  Most recent TSH of 7.4  Hypokalemia- (present on admission) Improved with replacement.  Will prescribe on discharge.  Consultants: None Procedures performed: None Disposition: Home Diet recommendation:  Discharge Diet Orders (From admission, onward)     Start     Ordered   07/01/21 0000  Diet - low sodium heart healthy        07/01/21 0958           Cardiac diet  DISCHARGE MEDICATION: Allergies as  of 07/01/2021       Reactions   Crestor [rosuvastatin Calcium]    Severe liver function problems   Hydrocodone-acetaminophen Anxiety, Itching   Lidocaine Hcl Other  (See Comments)   Novacaine:  Becomes very shaky   Lipitor [atorvastatin Calcium] Other (See Comments)   Elevated liver function    Procaine Other (See Comments)   shaking   Rosuvastatin Other (See Comments)   Severe liver function problems   Theophylline Other (See Comments)   Becomes very shaky skaking skaking   Vicodin [hydrocodone-acetaminophen] Itching   Itching all over   Codeine Nausea Only, Anxiety, Other (See Comments)   Also complained of dizziness.  Has same problems with oxycodone and hydrocodone Visual Disturbance, Balance Difficulty, Dizzy "Room Spinning; "Swimming" Balance, vision, nausea, dizzy, "swimming", "room spinning" Balance, vision, nausea, dizzy, "swimming", "room spinning"   Oxycodone-acetaminophen Nausea Only, Anxiety, Other (See Comments)   Visual Disturbance, Balance Difficulty, Dizzy "Room Spinning; "Swimming"   Propoxyphene Anxiety, Nausea Only, Other (See Comments)   Visual Disturbance, Balance Difficulty, Dizzy "Room Spinning; "Swimming" Balance, vision, nausea, dizzy, "swimming", "room spinning" Balance, vision, nausea, dizzy, "swimming, "room spinning"   Quinine Derivatives Nausea Only   dizzy   Sulfa Antibiotics Nausea Only   Also complained of dizziness   Atorvastatin Other (See Comments)   Severe liver function problems   Ephedrine Other (See Comments)   shaking   Other Other (See Comments)   Quinine causes nausea, dizzy   Oxycodone Other (See Comments)   Balance, vision, nausea, dizzy, "swimming", "room spinning"   Quinine Other (See Comments)   Epinephrine Other (See Comments)   Patient becomes "shaky" shaking shaking   Penicillins Rash   Pt reported all over body rash in 1958 Has patient had a PCN reaction causing immediate rash, facial/tongue/throat swelling, SOB or lightheadedness with hypotension:Yes Has patient had a PCN reaction causing severe rash involving mucus membranes or skin necrosis: No Has patient had a PCN reaction  that required hospitalization: No Has patient had a PCN reaction occurring within the last 10 years:No   Theophyllines Other (See Comments)   Patient becomes "shaky"        Medication List     STOP taking these medications    doxycycline 100 MG tablet Commonly known as: VIBRA-TABS   mupirocin ointment 2 % Commonly known as: Bactroban       TAKE these medications    albuterol (2.5 MG/3ML) 0.083% nebulizer solution Commonly known as: PROVENTIL Take 3 mLs (2.5 mg total) by nebulization every 6 (six) hours as needed for wheezing or shortness of breath. What changed: Another medication with the same name was removed. Continue taking this medication, and follow the directions you see here.   Allergy Relief 180 MG tablet Generic drug: fexofenadine TAKE 1 TABLET BY MOUTH EVERY DAY   allopurinol 300 MG tablet Commonly known as: ZYLOPRIM Take 300 mg by mouth daily.   ALPRAZolam 0.5 MG tablet Commonly known as: XANAX TAKE 1 TO 2 TABLETS BY MOUTH TWICE DAILY AS NEEDED FOR ANXIETY OR SLEEP   Arnicare Gel Apply 1 application topically daily as needed (soreness).   azelastine 0.1 % nasal spray Commonly known as: ASTELIN Place 2 sprays into both nostrils daily. Use in each nostril as directed What changed:  when to take this additional instructions   Biotin 5000 MCG Caps Take 5,000 mcg by mouth daily.   cholecalciferol 1000 units tablet Commonly known as: VITAMIN D Take 1,000 Units by mouth daily.   CHOLINE  CITRATE PO Take 1 capsule by mouth in the morning.   co-enzyme Q-10 50 MG capsule Take 100 mg by mouth daily.   colchicine 0.6 MG tablet Take 0.6 mg by mouth daily as needed (flare  up).   cycloSPORINE 0.05 % ophthalmic emulsion Commonly known as: RESTASIS Apply 1 drop to eye 2 (two) times daily as needed (allergies).   dextromethorphan-guaiFENesin 30-600 MG 12hr tablet Commonly known as: MUCINEX DM Take 1 tablet by mouth 2 (two) times daily.    EPINEPHrine 0.3 mg/0.3 mL Soaj injection Commonly known as: EPI-PEN Inject 0.3 mg into the muscle as needed for anaphylaxis.   escitalopram 10 MG tablet Commonly known as: LEXAPRO TAKE 1 TABLET(10 MG) BY MOUTH DAILY What changed: See the new instructions.   ezetimibe 10 MG tablet Commonly known as: ZETIA Take 10 mg by mouth 2 (two) times a week. Take on Tues & Fri   Fish Oil 1000 MG Caps Take 1 capsule by mouth daily.   fluticasone 50 MCG/ACT nasal spray Commonly known as: FLONASE Place 1 spray into both nostrils every morning.   gabapentin 300 MG capsule Commonly known as: NEURONTIN Take 1 capsule (300 mg total) by mouth 3 (three) times daily. What changed: when to take this   hydrochlorothiazide 25 MG tablet Commonly known as: HYDRODIURIL TAKE 2 TABLETS(50 MG) BY MOUTH DAILY   levalbuterol 45 MCG/ACT inhaler Commonly known as: Xopenex HFA Inhale 2 puffs into the lungs every 6 (six) hours as needed for wheezing or shortness of breath.   levocetirizine 5 MG tablet Commonly known as: XYZAL Take 1 tablet (5 mg total) by mouth every evening.   levothyroxine 75 MCG tablet Commonly known as: SYNTHROID TAKE 1 TABLET(75 MCG) BY MOUTH DAILY   metoprolol succinate 25 MG 24 hr tablet Commonly known as: TOPROL-XL TAKE 1/2 TABLET(12.5 MG) BY MOUTH DAILY   montelukast 10 MG tablet Commonly known as: SINGULAIR Take 1 tablet (10 mg total) by mouth at bedtime.   nitrofurantoin 50 MG capsule Commonly known as: MACRODANTIN Take 50 mg by mouth 3 (three) times a week. Take three days a week (MWF)   ondansetron 4 MG tablet Commonly known as: ZOFRAN Take 1 tablet (4 mg total) by mouth every 8 (eight) hours as needed for nausea or vomiting.   potassium chloride SA 20 MEQ tablet Commonly known as: KLOR-CON M TAKE 1 TABLET(20 MEQ) BY MOUTH DAILY   predniSONE 10 MG tablet Commonly known as: DELTASONE Take 4 tablets (40 mg) daily for 2 days, then, Take 3 tablets (30 mg) daily  for 2 days, then, Take 2 tablets (20 mg) daily for 2 days, then, Take 1 tablets (10 mg) daily for 1 days, then stop What changed:  how much to take how to take this when to take this additional instructions   Repatha Pushtronex System 420 MG/3.5ML Soct Generic drug: Evolocumab with Infusor INJECT 1 UNIT INTO THE SKIN EVERY 30 DAYS   TART CHERRY PO Take 1 capsule by mouth in the morning and at bedtime.   Trelegy Ellipta 200-62.5-25 MCG/ACT Aepb Generic drug: Fluticasone-Umeclidin-Vilant Inhale 1 puff into the lungs daily.   VITAMIN B COMPLEX PO Take 1 tablet by mouth daily.   VITAMIN B-12 PO Take 1 tablet by mouth daily.   VITAMIN B-6 PO Take 1 tablet by mouth daily.   vitamin C 1000 MG tablet Take 1,000 mg by mouth in the morning.   VITAMIN E PO Take 1 tablet by mouth daily.   Xarelto 20  MG Tabs tablet Generic drug: rivaroxaban TAKE 1 TABLET(20 MG) BY MOUTH DAILY WITH SUPPER What changed: See the new instructions.   Zinc 100 MG Tabs Take 1 tablet by mouth in the morning.        Follow-up Information     Biagio Borg, MD Follow up in 1 week(s).   Specialties: Internal Medicine, Radiology Contact information: [place] [address] [phone]         Jettie Booze, MD .   Specialties: Cardiology, Radiology, Interventional Cardiology Contact information: [address] [address] [phone]         Collene Gobble, MD Follow up in 1 week(s).   Specialty: Pulmonary Disease Contact information: [address] [place] [address] [phone]                 Discharge Exam: Danley Danker Weights   06/26/21 1159  Weight: 117.5 kg   Vitals with BMI 07/01/2021 06/30/2021 06/30/2021  Height - - -  Weight - - -  BMI - - -  Systolic 371 062 694  Diastolic 99 91 98  Pulse 72 75 82    General:  Average built, not in obvious distress on nasal cannula oxygen HENT:   No scleral  pallor or icterus noted. Oral mucosa is moist.  Chest:    Diminished breath sounds bilaterally. No wheezing. CVS: S1 &S2 heard. No murmur.  Regular rate and rhythm. Abdomen: Soft, nontender, nondistended.  Bowel sounds are heard.   Extremities: No cyanosis, clubbing trace edema peripheral pulses are palpable. Psych: Alert, awake and oriented, normal mood CNS:  No cranial nerve deficits.  Power equal in all extremities.   Skin: Warm and dry.  No rashes noted.   Condition at discharge: good  The results of significant diagnostics from this hospitalization (including imaging, microbiology, ancillary and laboratory) are listed below for reference.   Imaging Studies: DG Chest 2 View  Result Date: 06/26/2021 CLINICAL DATA:  Hypoxia, shortness of breath EXAM: CHEST - 2 VIEW COMPARISON:  06/15/2021 FINDINGS: Bilateral upper lobe and to lesser extent bilateral lower lobe airspace disease concerning for multilobar pneumonia including atypical etiologies. No pleural effusion or pneumothorax. Stable cardiomegaly. No acute osseous abnormality. Osteoarthritis of bilateral glenohumeral joints. IMPRESSION: 1. Bilateral upper lobe and to lesser extent bilateral lower lobe airspace disease concerning for multilobar pneumonia including atypical etiologies. Electronically Signed   By: Kathreen Devoid M.D.   On: 06/26/2021 10:50   DG Chest 2 View  Result Date: 06/12/2021 CLINICAL DATA:  Cough, shortness of breath. EXAM: CHEST - 2 VIEW COMPARISON:  May 17, 2021. FINDINGS: Stable cardiomegaly is noted. Mild bibasilar subsegmental atelectasis is noted. Bony thorax is unremarkable. IMPRESSION: Mild bibasilar subsegmental atelectasis. Electronically Signed   By: Marijo Conception M.D.   On: 06/12/2021 13:39   CT Angio Chest Pulmonary Embolism (PE) W or WO Contrast  Result Date: 06/15/2021 CLINICAL DATA:  Chest pain, shortness of breath, fever, hypoxia EXAM: CT ANGIOGRAPHY CHEST WITH CONTRAST TECHNIQUE: Multidetector CT  imaging of the chest was performed using the standard protocol during bolus administration of intravenous contrast. Multiplanar CT image reconstructions and MIPs were obtained to evaluate the vascular anatomy. RADIATION DOSE REDUCTION: This exam was performed according to the departmental dose-optimization program which includes automated exposure control, adjustment of the mA and/or kV according to patient size and/or use of iterative reconstruction technique. CONTRAST:  88mL OMNIPAQUE IOHEXOL 350 MG/ML SOLN COMPARISON:  CT 03/27/2015.  X-ray 06/12/2021 FINDINGS: Cardiovascular: Satisfactory opacification of the pulmonary arteries. Respiratory motion artifact degrades evaluation of the more distal pulmonary arterial branches, particularly within the lung bases. No filling defect to the segmental branch level to suggest pulmonary embolism. Main pulmonary trunk measures 3.5 cm in diameter. Thoracic aorta is nonaneurysmal. Atherosclerotic calcifications of the aorta and coronary arteries. Heart size is mildly enlarged. No pericardial effusion. Mediastinum/Nodes: No pathologically enlarged axillary, mediastinal, or hilar lymph nodes. Trachea and esophagus within normal limits. Small sliding-type hiatal hernia. Lungs/Pleura: Multifocal areas of airspace consolidation throughout both lungs, much of which is ground-glass. Findings are most confluent within the left upper lobe anteriorly. No pleural effusion or pneumothorax. Upper Abdomen: Grossly stable appearance of a left renal cyst. 10 mm left renal stone. Musculoskeletal: Severe osteoarthritis of the right glenohumeral joint with multiple intra-articular loose bodies. Osseous structures appear demineralized. No acute bony findings. Review of the MIP images confirms the above findings. IMPRESSION: 1. Exam is slightly degraded by respiratory motion artifact. No pulmonary embolism is identified within the limitations of this exam. 2. Multifocal areas of airspace  consolidation throughout both lungs, most confluent within the left upper lobe anteriorly, suggestive of multifocal pneumonia. 3. Left-sided nephrolithiasis. 4. Small hiatal hernia. 5. Aortic and coronary artery atherosclerosis (ICD10-I70.0). Electronically Signed   By: Davina Poke D.O.   On: 06/15/2021 13:57   ECHOCARDIOGRAM COMPLETE  Result Date: 06/13/2021    ECHOCARDIOGRAM REPORT   Patient Name:   [name] Date of Exam: 06/13/2021 Medical Rec #:  [id number]   Height:       66.5 in Accession #:    [id number]  Weight:       261.0 lb Date of Birth:  [birth year]   BSA:          2.252 m Patient Age:    74 years    BP:           148/106 mmHg Patient Gender: F           HR:           90 bpm. Exam Location:  Inpatient Procedure: 2D Echo, 3D Echo, Cardiac Doppler and Color Doppler Indications:    Elevated Troponin  History:        Patient has prior history of Echocardiogram examinations, most                 recent 12/10/2019. CAD, COPD, Arrythmias:Bradycardia and Atrial                 Fibrillation; Risk Factors:Hypertension, Sleep Apnea and Former                 Smoker. GOUT. Past history of breast cancer.  Sonographer:    Darlina Sicilian RDCS Referring Phys: [id number] A CALDWELL POWELL [place]  1. Left ventricular ejection fraction, by estimation, is 60 to 65%. The left ventricle has normal function. The left ventricle has no regional wall motion abnormalities. There is mild concentric left ventricular hypertrophy. Left ventricular diastolic parameters are indeterminate.  2. Right ventricular systolic function is normal. The right ventricular size is normal. There is moderately elevated pulmonary artery systolic pressure.  3. Left atrial size was moderately dilated.  4. Right atrial size was mildly dilated.  5. No evidence of mitral valve regurgitation.  6. There is mild calcification of the aortic valve. Aortic valve regurgitation is not visualized.  7. The inferior vena cava is dilated in size with <50%  respiratory variability,  suggesting right atrial pressure of 15 mmHg. Conclusion(s)/Recommendation(s): Compared to prior echo RA pressure increased. FINDINGS  Left Ventricle: Left ventricular ejection fraction, by estimation, is 60 to 65%. The left ventricle has normal function. The left ventricle has no regional wall motion abnormalities. The left ventricular internal cavity size was normal in size. There is  mild concentric left ventricular hypertrophy. Left ventricular diastolic parameters are indeterminate. Right Ventricle: The right ventricular size is normal. No increase in right ventricular wall thickness. Right ventricular systolic function is normal. There is moderately elevated pulmonary artery systolic pressure. The tricuspid regurgitant velocity is 3.05 m/s, and with an assumed right atrial pressure of 15 mmHg, the estimated right ventricular systolic pressure is 73.7 mmHg. Left Atrium: Left atrial size was moderately dilated. Right Atrium: Right atrial size was mildly dilated. Pericardium: There is no evidence of pericardial effusion. Mitral Valve: No evidence of mitral valve regurgitation. Tricuspid Valve: Tricuspid valve regurgitation is mild. Aortic Valve: There is mild calcification of the aortic valve. Aortic valve regurgitation is not visualized. Pulmonic Valve: The pulmonic valve was grossly normal. Pulmonic valve regurgitation is mild. Aorta: The aortic root and ascending aorta are structurally normal, with no evidence of dilitation. Venous: The inferior vena cava is dilated in size with less than 50% respiratory variability, suggesting right atrial pressure of 15 mmHg. IAS/Shunts: The interatrial septum was not well visualized.  LEFT VENTRICLE PLAX 2D LVIDd:         4.15 cm   Diastology LV PW:         1.20 cm   LV e' medial:    9.96 cm/s LV IVS:        1.20 cm   LV E/e' medial:  14.5 LVOT diam:     2.20 cm   LV e' lateral:   14.25 cm/s LV SV:         63        LV E/e' lateral: 10.2 LV SV Index:    28 LVOT Area:     3.80 cm                           3D Volume EF:                          3D EF:        57 %                          LV EDV:       116 ml                          LV ESV:       50 ml                          LV SV:        66 ml RIGHT VENTRICLE RV S prime:     12.50 cm/s TAPSE (M-mode): 1.6 cm LEFT ATRIUM             Index        RIGHT ATRIUM           Index LA diam:        4.40 cm 1.95 cm/m   RA Area:     24.50 cm LA Vol (A2C):   79.8 ml  35.44 ml/m  RA Volume:   65.60 ml  29.13 ml/m LA Vol (A4C):   92.6 ml 41.12 ml/m LA Biplane Vol: 90.5 ml 40.19 ml/m  AORTIC VALVE LVOT Vmax:   81.70 cm/s LVOT Vmean:  56.500 cm/s LVOT VTI:    0.165 m  AORTA Ao Root diam: 3.60 cm Ao Asc diam:  3.00 cm MITRAL VALVE                TRICUSPID VALVE MV Area (PHT):  4.64 cm    TR Peak grad:   37.2 mmHg MV Area (plan): 7.34 cm    TR Vmax:        305.00 cm/s MV Decel Time:  163 msec MV E velocity: 144.67 cm/s  SHUNTS                             Systemic VTI:  0.16 m                             Systemic Diam: 2.20 cm Phineas Inches Electronically signed by Phineas Inches Signature Date/Time: 06/13/2021/5:14:36 PM    Final    DG ESOPHAGUS W SINGLE CM (SOL OR THIN BA)  Result Date: 06/29/2021 CLINICAL DATA:  Swallowing difficulty EXAM: ESOPHAGUS/BARIUM SWALLOW/TABLET STUDY TECHNIQUE: Single contrast examination was performed using thin liquid barium. This exam was performed by Margurite Auerbach, NP, and was supervised and interpreted by Dr. Leonia Reeves. FLUOROSCOPY TIME:  Radiation Exposure Index (as provided by the fluoroscopic device): 15.8 mGy If the device does not provide the exposure index: Fluoroscopy Time:  dictate in minutes and seconds Number of Acquired Images: COMPARISON:  NONE. FINDINGS: Patient limited mobility. Suboptimal exam performed in semi upright position. Swallowing: Appears normal. No vestibular penetration or aspiration seen. Pharynx: Unremarkable. Esophagus: No esophageal stricture or mass. Small esophageal  diverticulum just above the GE junction measures 2 cm. Small hiatal hernia. Esophageal motility: Mild dysmotility with delayed initiation of primary stripping wave. Hiatal Hernia: Small Gastroesophageal reflux: None visualized. Ingested 18mm barium tablet: Not administered Other: None. IMPRESSION: 1. Limited mobility exam. 2. No esophageal stricture or mass.  No obstruction. 3. Mild esophageal dysmotility. Electronically Signed   By: Suzy Bouchard M.D.   On: 06/29/2021 15:34    Microbiology: Results for orders placed or performed during the hospital encounter of 06/26/21  Blood Culture (routine x 2)     Status: None   Collection Time: 06/26/21  1:27 PM   Specimen: BLOOD LEFT HAND  Result Value Ref Range Status   Specimen Description BLOOD LEFT HAND  Final   Special Requests   Final    BOTTLES DRAWN AEROBIC AND ANAEROBIC Blood Culture results may not be optimal due to an inadequate volume of blood received in culture bottles   Culture   Final    NO GROWTH 5 DAYS Performed at [facility], [place] [address], [place], [place] [address]    Report Status 07/01/2021 FINAL  Final  Resp Panel by RT-PCR (Flu A&B, Covid)     Status: None   Collection Time: 06/26/21  4:35 PM   Specimen: Nasopharyngeal(NP) swabs in vial transport medium  Result Value Ref Range Status   SARS Coronavirus 2 by RT PCR NEGATIVE NEGATIVE Final    Comment: (NOTE) SARS-CoV-2 target nucleic acids are NOT DETECTED.  The SARS-CoV-2 RNA is generally detectable in upper respiratory specimens during the acute phase of infection.  The lowest concentration of SARS-CoV-2 viral copies this assay can detect is 138 copies/mL. A negative result does not preclude SARS-Cov-2 infection and should not be used as the sole basis for treatment or other patient management decisions. A negative result may occur with  improper specimen collection/handling, submission of specimen other than nasopharyngeal swab, presence of viral mutation(s)  within the areas targeted by this assay, and inadequate number of viral copies(<138 copies/mL). A negative result must be combined with clinical observations, patient history, and epidemiological information. The expected result is Negative.  Fact Sheet for Patients:  [name]  Fact Sheet for Healthcare Providers:  [name]  This test is no t yet approved or cleared by the [name] FDA and  has been authorized for detection and/or diagnosis of SARS-CoV-2 by FDA under an Emergency Use Authorization (EUA). This EUA will remain  in effect (meaning this test can be used) for the duration of the COVID-19 declaration under Section 564(b)(1) of the Act, 21 U.S.C.section 360bbb-3(b)(1), unless the authorization is terminated  or revoked sooner.       Influenza A by PCR NEGATIVE NEGATIVE Final   Influenza B by PCR NEGATIVE NEGATIVE Final    Comment: (NOTE) The Xpert Xpress SARS-CoV-2/FLU/RSV plus assay is intended as an aid in the diagnosis of influenza from Nasopharyngeal swab specimens and should not be used as a sole basis for treatment. Nasal washings and aspirates are unacceptable for Xpert Xpress SARS-CoV-2/FLU/RSV testing.  Fact Sheet for Patients: [name]  Fact Sheet for Healthcare Providers: [name]  This test is not yet approved or cleared by the [name] FDA and has been authorized for detection and/or diagnosis of SARS-CoV-2 by FDA under an Emergency Use Authorization (EUA). This EUA will remain in effect (meaning this test can be used) for the duration of the COVID-19 declaration under Section 564(b)(1) of the Act, 21 U.S.C. section 360bbb-3(b)(1), unless the authorization is terminated or revoked.  Performed at [facility], [place] [address], [place], [place] [address]   MRSA Next Gen by PCR, Nasal      Status: None   Collection Time: 06/27/21 10:13 AM   Specimen: Nasal Mucosa; Nasal Swab  Result Value Ref Range Status   MRSA by PCR Next Gen NOT DETECTED NOT DETECTED Final    Comment: (NOTE) The GeneXpert MRSA Assay (FDA approved for NASAL specimens only), is one component of a comprehensive MRSA colonization surveillance program. It is not intended to diagnose MRSA infection nor to guide or monitor treatment for MRSA infections. Test performance is not FDA approved in patients less than 102 years old. Performed at [facility], [place] [address] [place], [place] [address]   Culture, blood (Routine X 2) w Reflex to ID Panel     Status: None (Preliminary result)   Collection Time: 06/27/21  2:34 PM   Specimen: BLOOD  Result Value Ref Range Status   Specimen Description   Final    BLOOD LEFT ANTECUBITAL Performed at [place] [address], [place], [place] [address]    Special Requests   Final    BOTTLES DRAWN AEROBIC ONLY Blood Culture adequate volume Performed at [facility] [address] [place], [place] [address]    Culture   Final    NO GROWTH 4 DAYS Performed at [facility], [place] [address] [place], [place] [address]    Report Status PENDING  Incomplete    Labs: CBC: Recent Labs  Lab 06/26/21  1327 06/27/21 0406 06/28/21 0505 06/29/21 0505 06/30/21 0732  WBC 13.5* 6.8 10.5 10.7* 7.6  NEUTROABS 10.9*  --   --   --   --   HGB 12.0 10.5* 10.6* 11.1* 11.6*  HCT 36.5 33.1* 33.8* 34.8* 36.0  MCV 99.7 99.7 100.6* 99.4 98.4  PLT 192 173 188 202 751   Basic Metabolic Panel: Recent Labs  Lab 06/26/21 1540 06/27/21 0406 06/28/21 0505 06/29/21 0505 06/30/21 0508  NA 133* 133* 135 135 133*  K 3.8 3.4* 4.3 3.9 3.5  CL 100 102 104 100 96*  CO2 26 25 27 30 27   GLUCOSE 161* 175* 158* 167* 197*  BUN 22 25* 24* 32* 30*  CREATININE 0.95 1.00 0.82 0.85 0.83  CALCIUM 8.8* 8.7* 8.9 9.2 9.0  MG  --    --  2.4 2.4 2.3  PHOS  --   --   --   --  3.0   Liver Function Tests: Recent Labs  Lab 06/26/21 1540 06/27/21 0406  AST 31 24  ALT 12 15  ALKPHOS 47 48  BILITOT 2.2* 1.1  PROT 6.6 6.7  ALBUMIN 3.1* 3.2*   CBG: No results for input(s): GLUCAP in the last 168 hours.  Discharge time spent: greater than 30 minutes.  Signed: Flora Lipps, MD [facility] 07/01/2021

## 2021-07-01 NOTE — Progress Notes (Signed)
AVS and discharge instructions reviewed w/ pt and daughter at the bedside. Both verbalized understanding and had no further questions.

## 2021-07-01 NOTE — Plan of Care (Signed)

## 2021-07-02 ENCOUNTER — Telehealth: Payer: Self-pay

## 2021-07-02 LAB — CULTURE, BLOOD (ROUTINE X 2)
Culture: NO GROWTH
Special Requests: ADEQUATE

## 2021-07-02 NOTE — Telephone Encounter (Signed)
Transition Care Management Follow-up Telephone Call Date of discharge and from where: [place] 07-01-21 Dx: multifocal pneumonia How have you been since you were released from the hospital? Feeling better  Any questions or concerns? No  Items Reviewed: Did the pt receive and understand the discharge instructions provided? Yes  Medications obtained and verified? Yes  Other? No  Any new allergies since your discharge? No  Dietary orders reviewed? Yes Do you have support at home? Yes   Home Care and Equipment/Supplies: Were home health services ordered? no If so, what is the name of the agency? Na   Has the agency set up a time to come to the patient's home? not applicable Were any new equipment or medical supplies ordered?  No What is the name of the medical supply agency? na Were you able to get the supplies/equipment? not applicable Do you have any questions related to the use of the equipment or supplies? No  Functional Questionnaire: (I = Independent and D = Dependent) ADLs: I  Bathing/Dressing- I  Meal Prep- I  Eating- I  Maintaining continence- I  Transferring/Ambulation- I  Managing Meds- I  Follow up appointments reviewed:  PCP Hospital f/u appt confirmed? Yes  Scheduled to see Dr Jenny Reichmann  on 07-06-21 @ [facility] f/u appt confirmed? No pt will call Dr Lamonte Sakai and schedule fu appt . Are transportation arrangements needed? No  If their condition worsens, is the pt aware to call PCP or go to the Emergency Dept.? Yes Was the patient provided with contact information for the PCP's office or ED? Yes Was to pt encouraged to call back with questions or concerns? Yes

## 2021-07-06 ENCOUNTER — Ambulatory Visit: Payer: Medicare Other | Admitting: Internal Medicine

## 2021-07-06 ENCOUNTER — Encounter: Payer: Self-pay | Admitting: Internal Medicine

## 2021-07-09 ENCOUNTER — Other Ambulatory Visit: Payer: Self-pay | Admitting: Internal Medicine

## 2021-07-14 DIAGNOSIS — G4733 Obstructive sleep apnea (adult) (pediatric): Secondary | ICD-10-CM | POA: Diagnosis not present

## 2021-07-16 ENCOUNTER — Other Ambulatory Visit: Payer: Self-pay | Admitting: Internal Medicine

## 2021-07-16 ENCOUNTER — Telehealth: Payer: Self-pay | Admitting: Emergency Medicine

## 2021-07-16 ENCOUNTER — Encounter: Payer: Self-pay | Admitting: Interventional Cardiology

## 2021-07-16 DIAGNOSIS — R058 Other specified cough: Secondary | ICD-10-CM

## 2021-07-16 MED ORDER — PREDNISONE 10 MG PO TABS: ORAL_TABLET | ORAL | 0 refills | Status: DC

## 2021-07-16 NOTE — Telephone Encounter (Signed)
Suspect postinfectious cough and upper airway irritation from persistent coughing. If her cough is no more productive and breathing is stable, eating and drink well and no fevers, would initiate cough control regimen. Delsym 2 tsp Twice daily. Mucinex 600 mg Twice daily. Chlortab 4 mg At bedtime for cough. Frequent sips of water and avoid throat clearing. Sugar free hard candies or non-menthol cough drops. Make sure she is using her flonase nasal spray if she feels like she has mucus in the back of her throat. Can also use chloraseptic spray for sore throat. Unable to do tessalon perles d/t allergies. Will send in another prednisone taper. If she doesn't improve or worsens, needs to come in for sooner OV. Thanks.

## 2021-07-16 NOTE — Telephone Encounter (Signed)
Primary Pulmonologist: Dr. Lamonte Sakai ?Last office visit and with whom: 06/26/2021 Roxan Diesel NP ?What do we see them for (pulmonary problems): multifocal pna, asthma with exacerbation and chronic respiratory failure ?Last OV assessment/plan:  ? Assessment & Plan Note by Clayton Bibles, NP at 06/26/2021 10:13 AM ? ?Author: Clayton Bibles, NP Author Type: Nurse Practitioner Filed: 06/26/2021 11:55 AM  ?Note Status: [place] List: Cosign Not Required Encounter Date: 06/26/2021  ?Problem: Multifocal pneumonia  ?Editor: Clayton Bibles, NP (Nurse Practitioner)      ?Prior Versions: 1. Clayton Bibles, NP (Nurse Practitioner) at 06/26/2021 11:53 AM - Alison Stalling  ? 2. Clayton Bibles, NP (Nurse Practitioner) at 06/26/2021 11:53 AM - Alison Stalling  ? 3. Clayton Bibles, NP (Nurse Practitioner) at 06/26/2021 11:39 AM - Alison Stalling  ? 4. Clayton Bibles, NP (Nurse Practitioner) at 06/26/2021 11:00 AM - Alison Stalling  ? 5. Clayton Bibles, NP (Nurse Practitioner) at 06/26/2021 10:17 AM - Alison Stalling  ? 6. Clayton Bibles, NP (Nurse Practitioner) at 06/26/2021 10:14 AM - Written  ?  ?Patient initially appeared stable; Quickly decompensated after minimal exertion and was ill appearing in mild respiratory distress. She has failed outpatient treatment at this point. Recent fevers up to 102 F despite being on day 4 of second course of abx. CXR with persistent multifocal pna. Difficulties maintaining saturations in office; desats to 70's on 3 lpm with minimal activity on forehead and finger probe. EMS contacted for transport. Dr. Lamonte Sakai in office and agrees with plan.  ?  ?  ? ? ? Patient Instructions by Clayton Bibles, NP at 06/26/2021 9:00 AM ? ?Author: Clayton Bibles, NP Author Type: Nurse Practitioner Filed: 06/26/2021 11:52 AM  ?Note Status: Addendum Cosign: Cosign Not Required Encounter Date: 06/26/2021  ?Editor: Clayton Bibles, NP (Nurse Practitioner)      ?Prior Versions: 1. Clayton Bibles, NP (Nurse Practitioner) at 06/26/2021  9:53 AM -  Addendum  ? 2. Clayton Bibles, NP (Nurse Practitioner) at 06/26/2021  9:52 AM - Addendum  ? 3. Clayton Bibles, NP (Nurse Practitioner) at 06/26/2021  9:51 AM - Addendum  ? 4. Clayton Bibles, NP (Nurse Practitioner) at 06/26/2021  9:48 AM - Signed  ?  ?  ?  ?  ? ? ? Assessment & Plan Note by Clayton Bibles, NP at 06/26/2021 10:15 AM ? ?Author: Clayton Bibles, NP Author Type: Nurse Practitioner Filed: 06/26/2021 11:36 AM  ?Note Status: [place]ell List: Cosign Not Required Encounter Date: 06/26/2021  ?Problem: Asthma exacerbation  ?Editor: Clayton Bibles, NP (Nurse Practitioner)      ?Prior Versions: 1. Clayton Bibles, NP (Nurse Practitioner) at 06/26/2021 10:16 AM - Written  ?  ?Worsening symptoms with increased SOB and wheezing. Flare likely related to persistent multifocal pna.Albuterol neb in office.  ?  ?  ? ? ? Assessment & Plan Note by Clayton Bibles, NP at 06/26/2021 10:17 AM ? ?Author: Clayton Bibles, NP Author Type: Nurse Practitioner Filed: 06/26/2021 11:36 AM  ?Note Status: [place]ell List: Cosign Not Required Encounter Date: 06/26/2021  ?Problem: Acute respiratory failure with hypoxia (HCC)  ?Editor: Clayton Bibles, NP (Nurse Practitioner)      ?Prior Versions: 1. Clayton Bibles, NP (Nurse Practitioner) at 06/26/2021 10:18 AM - Written  ?  ?Was discharged on 3 lpm. Has had desaturations as low as 60's at home; occasionally in 70's. Today with desaturations into 70's with minimal exertion on 3 lpm. Increased to  6 lpm [place] and 90% - transitioned to non rebreather mask. EMS contacted for transport to [place]. ?  ?  ? ? ? Assessment & Plan Note by Clayton Bibles, NP at 06/26/2021 10:19 AM ? ?Author: Clayton Bibles, NP Author Type: Nurse Practitioner Filed: 06/26/2021 10:20 AM  ?Note Status: Written Cosign: Cosign Not Required Encounter Date: 06/26/2021  ?Problem: CHF (congestive heart failure) ([place])  ?Editor: Clayton Bibles, NP (Nurse Practitioner)      ?       ?  ?Appears compensated on exam.  Recent echo with normal function. Seen by cards post hospitalization with no changes. Follow up with cardiology as scheduled.  ?  ?  ? ? ?Was appointment offered to patient (explain)?  She is schedule to see Dr. Lamonte Sakai on 3/22 ? ? ?Reason for call: Still has a cough in top of chest is tighter than the rest of it.  Throat is sore and swollen.  She says she coughs all the time.  She finished the antibiotic and prednisone on Monday morning (last week).  She is on oxygen at 3L, her oxygen levels are low ninety's to 96%.  She is only coughing up some mucous at time and it is clear to grayish.  Denies any fever.  Her breathing is improved since she returned home.  She has mucous that stays in her throat, it will not come up or go down.  She said she was better before the prednisone stopped.    ? ?Katie, please advise if patient needs to continue on further Prednisone until seen by Dr. Lamonte Sakai. ? ?(examples of things to ask: : When did symptoms start? Fever? Cough? Productive? Color tPatient states still having cough. Would like to know if she needs Prednisoneo sputum? More sputum than usual? Wheezing? Have you needed increased oxygen? Are you taking your respiratory medications? What over the counter measures have you tried?) ? ?Allergies  ?Allergen Reactions  ? Crestor [Rosuvastatin Calcium]   ?  Severe liver function problems  ? Hydrocodone-Acetaminophen Anxiety and Itching  ? Lidocaine Hcl Other (See Comments)  ?  Novacaine:  Becomes very shaky  ? Lipitor [Atorvastatin Calcium] Other (See Comments)  ?  Elevated liver function   ? Procaine Other (See Comments)  ?  shaking  ? Rosuvastatin Other (See Comments)  ?  Severe liver function problems  ? Theophylline Other (See Comments)  ?  Becomes very shaky ?skaking ?skaking  ? Vicodin [Hydrocodone-Acetaminophen] Itching  ?  Itching all over  ? Codeine Nausea Only, Anxiety and Other (See Comments)  ?  Also complained of dizziness.  Has same problems with oxycodone and  hydrocodone ?Visual Disturbance, Balance Difficulty, Dizzy "Room Spinning; "Swimming" ?Balance, vision, nausea, dizzy, "swimming", "room spinning" ?Balance, vision, nausea, dizzy, "swimming", "room spinning"  ? Oxycodone-Acetaminophen Nausea Only, Anxiety and Other (See Comments)  ?  Visual Disturbance, Balance Difficulty, Dizzy "Room Spinning; "Swimming"  ? Propoxyphene Anxiety, Nausea Only and Other (See Comments)  ?  Visual Disturbance, Balance Difficulty, Dizzy "Room Spinning; "Swimming" ?Balance, vision, nausea, dizzy, "swimming", "room spinning" ?Balance, vision, nausea, dizzy, "swimming, "room spinning"  ? Quinine Derivatives Nausea Only  ?  dizzy  ? Sulfa Antibiotics Nausea Only  ?  Also complained of dizziness  ? Atorvastatin Other (See Comments)  ?  Severe liver function problems  ? Ephedrine Other (See Comments)  ?  shaking  ? Other Other (See Comments)  ?  Quinine causes nausea, dizzy  ? Oxycodone Other (See Comments)  ?  Balance, vision, nausea, dizzy, "swimming", "room spinning"  ? Quinine Other (See Comments)  ? Epinephrine Other (See Comments)  ?  Patient becomes "shaky" ?shaking ?shaking  ? Penicillins Rash  ?  Pt reported all over body rash in 1958 ?Has patient had a PCN reaction causing immediate rash, facial/tongue/throat swelling, SOB or lightheadedness with hypotension:Yes ?Has patient had a PCN reaction causing severe rash involving mucus membranes or skin necrosis: No ?Has patient had a PCN reaction that required hospitalization: No ?Has patient had a PCN reaction occurring within the last 10 years:No ? ?  ? Theophyllines Other (See Comments)  ?  Patient becomes "shaky"  ? ? ?Immunization History  ?Administered Date(s) Administered  ? Fluad Quad(high Dose 65+) 03/30/2019, 01/21/2020, 04/24/2021  ? Influenza Split 04/06/2009, 04/05/2010, 01/10/2011, 02/10/2012, 04/23/2013  ? Influenza, High Dose Seasonal PF 03/20/2017, 01/19/2018  ? Influenza-Unspecified 05/08/2015, 01/23/2016  ? PFIZER(Purple  Top)SARS-COV-2 Vaccination 06/11/2019, 07/06/2019, 02/28/2020  ? Pneumococcal Conjugate-13 06/01/2013, 03/20/2017  ? Pneumococcal Polysaccharide-23 03/16/2003, 01/23/2016  ? Td 12/08/2009  ?  ?

## 2021-07-16 NOTE — Telephone Encounter (Signed)
Spoke with the pt and notified of response per Dr Joellen Jersey. She verbalized understanding. Will call for appt if not improving.  ?

## 2021-07-18 DIAGNOSIS — J9601 Acute respiratory failure with hypoxia: Secondary | ICD-10-CM | POA: Diagnosis not present

## 2021-07-18 DIAGNOSIS — J441 Chronic obstructive pulmonary disease with (acute) exacerbation: Secondary | ICD-10-CM | POA: Diagnosis not present

## 2021-07-18 DIAGNOSIS — G4733 Obstructive sleep apnea (adult) (pediatric): Secondary | ICD-10-CM | POA: Diagnosis not present

## 2021-07-19 ENCOUNTER — Ambulatory Visit: Payer: Medicare Other | Admitting: Internal Medicine

## 2021-07-20 ENCOUNTER — Encounter: Payer: Self-pay | Admitting: Internal Medicine

## 2021-07-24 ENCOUNTER — Other Ambulatory Visit: Payer: Self-pay | Admitting: Internal Medicine

## 2021-07-24 MED ORDER — ATORVASTATIN CALCIUM 20 MG PO TABS: 20.0000 mg | ORAL_TABLET | Freq: Every day | ORAL | 3 refills | Status: DC

## 2021-07-24 NOTE — Telephone Encounter (Signed)
Please refill as per office routine med refill policy (all routine meds to be refilled for 3 mo or monthly (per pt preference) up to one year from last visit, then month to month grace period for 3 mo, then further med refills will have to be denied) ? ?

## 2021-07-25 ENCOUNTER — Ambulatory Visit (INDEPENDENT_AMBULATORY_CARE_PROVIDER_SITE_OTHER): Payer: Medicare Other | Admitting: Emergency Medicine

## 2021-07-25 ENCOUNTER — Other Ambulatory Visit: Payer: Self-pay

## 2021-07-25 ENCOUNTER — Telehealth: Payer: Self-pay | Admitting: Emergency Medicine

## 2021-07-25 ENCOUNTER — Encounter: Payer: Self-pay | Admitting: Emergency Medicine

## 2021-07-25 DIAGNOSIS — J45909 Unspecified asthma, uncomplicated: Secondary | ICD-10-CM

## 2021-07-25 DIAGNOSIS — R053 Chronic cough: Secondary | ICD-10-CM

## 2021-07-25 DIAGNOSIS — J189 Pneumonia, unspecified organism: Secondary | ICD-10-CM | POA: Diagnosis not present

## 2021-07-25 MED ORDER — PREDNISONE 10 MG PO TABS: ORAL_TABLET | ORAL | 0 refills | Status: DC

## 2021-07-25 NOTE — Assessment & Plan Note (Signed)
She is having persistent cough, her most problematic current symptom, likely related to both her recent pneumonia with associated upper airway irritation and active allergies.  She is concerned that she can come off prednisone right now, is concerned that the cough will return given the allergy season.  I have cautioned her that there are side effects and that I am worried that more prednisone we will set her up for possible recurrent pneumonia.  She does understand this.  She would like to continue a low-dose maintenance prednisone and then see if we are able to taper off as she continues to improve.  We will continue to treat her allergic disease with her same medications.  She has albuterol to use if needed. ? ?Take prednisone 20 mg daily for 5 days.  Then decrease to 10 mg daily and stay at that dose until you follow-up here.  At that time we will decide whether we can stop. ?Continue your oxygen as you have been using it.  At your next visit we will consider a walking oximetry to see if you can come off oxygen altogether. ?Continue your fluticasone and azelastine nasal sprays as you have been using them. ?Continue fexofenadine, Xyzal, Singulair ?Keep your albuterol nebs or Xopenex available to use up to every 4 hours if needed for shortness of breath, chest tightness, wheezing. ?Follow-up with APP in 1 month ?Follow with Dr. Lamonte Sakai in 6 months. ?

## 2021-07-25 NOTE — Addendum Note (Signed)
Addended by: Gavin Potters R on: 07/25/2021 04:29 PM ? ? Modules accepted: Orders ? ?

## 2021-07-25 NOTE — Assessment & Plan Note (Signed)
No bronchospasm on exam.  I think her symptoms are cough, upper airway irritation.  She has albuterol to use if needed, rarely needs ?

## 2021-07-25 NOTE — Progress Notes (Signed)
? ?Subjective:  ? ? Patient ID: [name], female    DOB: [birth year], 86 y.o.   MRN: [id number] ? ?Asthma ?She complains of cough, shortness of breath and wheezing. Pertinent negatives include no ear pain, fever, headaches, postnasal drip, rhinorrhea, sneezing, sore throat or trouble swallowing. Her past medical history is significant for asthma.  ? ?ROV 01/13/20 --86 year old woman with a minimal prior tobacco history, history of breast cancer, CAD, atrial fibrillation with diastolic dysfunction, OSA on CPAP and allergic rhinitis.  I have followed her for asthma.  Spirometry shows mixed obstruction and restriction.  At her last visit she had had some improvement in her asthma symptoms after stopping atenolol.  We was off Symbicort in late 2019.  Began to have increasing symptoms approximately early Spring 2021, possibly related to the timing of her COVID vaccine - wheeze, shortness of breath, minimal cough, some chest tightness. She was started back on Advair and then [employer]  Received steroid injection and pred prescription from Dr Jenny Reichmann in April and May. She describes persistent exertional SOB, wheezing with exertion. Weight is stable. Denies any GERD sx.  ?Currently managed on [place], using albuterol rarely at this time.  ?Using Singulair and Xyzal. Another nasal spray was added by Dr Orvil Feil at a recent visit but she can't remember the name.  ? ?Compliance with her CPAP >> great compliance, follows with Dr Isidoro Donning.  ? ?Chest x-ray 11/15/2019 reviewed by me, shows mild cardiomegaly, mild interstitial prominence with vascular congestion, no consolidation or effusions. ? ?ROV 07/25/21 --86 year old woman with a history of minimal tobacco use, mixed obstruction and restriction on pulmonary function testing consistent with probable asthma, obstructive sleep apnea on CPAP, allergic rhinitis.  PMH: Breast cancer, CAD, atrial fibrillation with diastolic dysfunction. ?She was seen in our office 2/21 in follow-up after an  admission for pneumonia earlier in February.  She had to go back to the hospital with fever, exertional hypoxemia.  She had not been using her CPAP following the original hospitalization.  She was discharged on 07/01/2021.  Today she reports that she is starting to feel better.  She was treated with another short round of prednisone after discharge, has helped her some.  ?Astelin, fluticasone NS, fexafenadine, singulair, xyzal. She uses albuterol nebs, xopenex rarely. No GERD sx.  ? ? ?Review of Systems  ?Constitutional:  Negative for fever and unexpected weight change.  ?HENT:  Negative for congestion, dental problem, ear pain, nosebleeds, postnasal drip, rhinorrhea, sinus pressure, sneezing, sore throat and trouble swallowing.   ?Eyes:  Negative for redness and itching.  ?Respiratory:  Positive for cough, chest tightness, shortness of breath and wheezing.   ?Cardiovascular:  Negative for palpitations and leg swelling.  ?Gastrointestinal:  Negative for nausea and vomiting.  ?Genitourinary:  Negative for dysuria.  ?Musculoskeletal:  Negative for joint swelling.  ?Skin:  Negative for rash.  ?Neurological:  Negative for headaches.  ?Hematological:  Does not bruise/bleed easily.  ?Psychiatric/Behavioral:  Negative for dysphoric mood. The patient is not nervous/anxious.   ? ?Past Medical History:  ?Diagnosis Date  ? Allergy   ? Anxiety   ? Arthritis   ? no cartlidge in knees  ? Asthma   ? Bradycardia   ? a. atenolol stopped due to this, HR 40s.  ? 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 left  ? CAD in native artery   ? a.  CAD s/p RCA stent 2000 with residual mild-mod disease of left system 2001.  ? Chronic  atrial fibrillation ([place])   ? COPD (chronic obstructive pulmonary disease) ([place])   ? Essential hypertension 03/24/2015  ? Gout   ? post operative  ? Hx of radiation therapy 04/02/11 -05/20/11  ? left breast  ? Hyperlipidemia   ? Hypothyroidism 03/24/2015  ? Incontinence of urine   ? Malignant neoplasm of  upper-outer quadrant of female breast ([place]) 12/26/2010  ? Osteoporosis 03/24/2015  ? Peripheral neuropathy 03/24/2015  ? Plantar fasciitis   ? Sleep apnea   ?  ? ?Family History  ?Problem Relation Age of Onset  ? Heart disease Father   ? Cancer Father   ?     intestinal polyps  ? Heart attack Father   ? Asthma Sister   ? Heart disease Brother   ? Heart attack Brother   ?  ? ?Social History  ? ?Socioeconomic History  ? Marital status: Widowed  ?  Spouse name: Not on file  ? Number of children: Not on file  ? Years of education: Not on file  ? Highest education level: Not on file  ?Occupational History  ? Not on file  ?Tobacco Use  ? Smoking status: Former  ?  Packs/day: 1.00  ?  Years: 4.00  ?  Pack years: 4.00  ?  Types: Cigarettes  ?  Quit date: 12/05/1962  ?  Years since quitting: 58.6  ? Smokeless tobacco: Never  ?Vaping Use  ? Vaping Use: Never used  ?Substance and Sexual Activity  ? Alcohol use: No  ? Drug use: No  ? Sexual activity: Not on file  ?Other Topics Concern  ? Not on file  ?Social History Narrative  ? Right handed  ? ?Social Determinants of Health  ? ?Financial Resource Strain: Not on file  ?Food Insecurity: Not on file  ?Transportation Needs: Not on file  ?Physical Activity: Not on file  ?Stress: Not on file  ?Social Connections: Not on file  ?Intimate Partner Violence: Not on file  ?No smoker exposure.  ?No occupational exposures ? ?Allergies  ?Allergen Reactions  ? Crestor [Rosuvastatin Calcium]   ?  Severe liver function problems  ? Hydrocodone-Acetaminophen Anxiety and Itching  ? Lidocaine Hcl Other (See Comments)  ?  Novacaine:  Becomes very shaky  ? Lipitor [Atorvastatin Calcium] Other (See Comments)  ?  Elevated liver function   ? Procaine Other (See Comments)  ?  shaking  ? Rosuvastatin Other (See Comments)  ?  Severe liver function problems  ? Theophylline Other (See Comments)  ?  Becomes very shaky ?skaking ?skaking  ? Vicodin [Hydrocodone-Acetaminophen] Itching  ?  Itching all over  ?  Codeine Nausea Only, Anxiety and Other (See Comments)  ?  Also complained of dizziness.  Has same problems with oxycodone and hydrocodone ?Visual Disturbance, Balance Difficulty, Dizzy "Room Spinning; "Swimming" ?Balance, vision, nausea, dizzy, "swimming", "room spinning" ?Balance, vision, nausea, dizzy, "swimming", "room spinning"  ? Oxycodone-Acetaminophen Nausea Only, Anxiety and Other (See Comments)  ?  Visual Disturbance, Balance Difficulty, Dizzy "Room Spinning; "Swimming"  ? Propoxyphene Anxiety, Nausea Only and Other (See Comments)  ?  Visual Disturbance, Balance Difficulty, Dizzy "Room Spinning; "Swimming" ?Balance, vision, nausea, dizzy, "swimming", "room spinning" ?Balance, vision, nausea, dizzy, "swimming, "room spinning"  ? Quinine Derivatives Nausea Only  ?  dizzy  ? Sulfa Antibiotics Nausea Only  ?  Also complained of dizziness  ? Ephedrine Other (See Comments)  ?  shaking  ? Other Other (See Comments)  ?  Quinine causes nausea, dizzy  ?  Oxycodone Other (See Comments)  ?  Balance, vision, nausea, dizzy, "swimming", "room spinning"  ? Quinine Other (See Comments)  ? Epinephrine Other (See Comments)  ?  Patient becomes "shaky" ?shaking ?shaking  ? Penicillins Rash  ?  Pt reported all over body rash in 1958 ?Has patient had a PCN reaction causing immediate rash, facial/tongue/throat swelling, SOB or lightheadedness with hypotension:Yes ?Has patient had a PCN reaction causing severe rash involving mucus membranes or skin necrosis: No ?Has patient had a PCN reaction that required hospitalization: No ?Has patient had a PCN reaction occurring within the last 10 years:No ? ?  ? Theophyllines Other (See Comments)  ?  Patient becomes "shaky"  ?  ? ?Outpatient Medications Prior to Visit  ?Medication Sig Dispense Refill  ? albuterol (PROVENTIL) (2.5 MG/3ML) 0.083% nebulizer solution Take 3 mLs (2.5 mg total) by nebulization every 6 (six) hours as needed for wheezing or shortness of breath. 75 mL 12  ? ALLERGY  RELIEF 180 MG tablet TAKE 1 TABLET BY MOUTH EVERY DAY 90 tablet 1  ? allopurinol (ZYLOPRIM) 300 MG tablet Take 300 mg by mouth daily.    ? ALPRAZolam (XANAX) 0.5 MG tablet TAKE 1 TO 2 TABLETS BY MOUTH TWICE DAILY

## 2021-07-25 NOTE — Patient Instructions (Addendum)
Take prednisone 20 mg daily for 5 days.  Then decrease to 10 mg daily and stay at that dose until you follow-up here.  At that time we will decide whether we can stop. ?Continue your oxygen as you have been using it.  At your next visit we will consider a walking oximetry to see if you can come off oxygen altogether. ?Continue your fluticasone and azelastine nasal sprays as you have been using them. ?Continue fexofenadine, Xyzal, Singulair ?Keep your albuterol nebs or Xopenex available to use up to every 4 hours if needed for shortness of breath, chest tightness, wheezing. ?Follow-up with APP in 1 month ?Follow with Dr. Lamonte Sakai in 6 months. ?

## 2021-07-25 NOTE — Assessment & Plan Note (Signed)
Improved clinically and on exam.  Plan to repeat her chest x-ray in 1 month when she follows up to look for clearance of her infiltrates. ?

## 2021-07-26 MED ORDER — PREDNISONE 10 MG PO TABS: ORAL_TABLET | ORAL | 0 refills | Status: AC

## 2021-07-26 NOTE — Telephone Encounter (Signed)
Called and spoke with pharmacist. She stated that they received the RX for prednisone yesterday but the instructions do not match with the quantity of pills. Only 10 pills were prescribed.  ? ?I advised her to cancel the old prescription and I would send a new one. She verbalized understanding.  ? ?New RX has been sent.  ? ?Nothing further needed at time of call.  ?

## 2021-08-07 ENCOUNTER — Ambulatory Visit: Payer: Medicare Other | Admitting: Internal Medicine

## 2021-08-13 ENCOUNTER — Other Ambulatory Visit: Payer: Self-pay | Admitting: Interventional Cardiology

## 2021-08-13 NOTE — Telephone Encounter (Signed)
Xarelto 20 mg refill request received. Pt is 86 years old, weight-115.7 kg, Crea- 0.83 on2/25/23, last seen by Dr. Irish Lack on 06/22/21, Diagnosis- afib, CrCl- 93.8; Dose is appropriate based on dosing criteria. Will send in refill to requested pharmacy.    ?

## 2021-08-14 ENCOUNTER — Encounter: Payer: Self-pay | Admitting: Internal Medicine

## 2021-08-14 ENCOUNTER — Ambulatory Visit (INDEPENDENT_AMBULATORY_CARE_PROVIDER_SITE_OTHER): Payer: Medicare Other | Admitting: Internal Medicine

## 2021-08-14 VITALS — BP 120/76 | HR 103 | Temp 98.4°F | Ht 65.0 in | Wt 250.0 lb

## 2021-08-14 DIAGNOSIS — G4733 Obstructive sleep apnea (adult) (pediatric): Secondary | ICD-10-CM | POA: Diagnosis not present

## 2021-08-14 DIAGNOSIS — J9611 Chronic respiratory failure with hypoxia: Secondary | ICD-10-CM | POA: Diagnosis not present

## 2021-08-14 DIAGNOSIS — R634 Abnormal weight loss: Secondary | ICD-10-CM | POA: Diagnosis not present

## 2021-08-14 DIAGNOSIS — I509 Heart failure, unspecified: Secondary | ICD-10-CM

## 2021-08-14 DIAGNOSIS — R300 Dysuria: Secondary | ICD-10-CM

## 2021-08-14 MED ORDER — CIPROFLOXACIN HCL 500 MG PO TABS: 500.0000 mg | ORAL_TABLET | Freq: Two times a day (BID) | ORAL | 0 refills | Status: AC

## 2021-08-14 NOTE — Progress Notes (Signed)
Patient ID: [name], female   DOB: 02-02-1936, 86 y.o.   MRN: [id number] ? ? ? ?    Chief Complaint: follow up dysuria, recent wt loss, low energy, chf ? ?     HPI:  [name] is a 86 y.o. female here with c/o 3 days onset dysuria with frequency but Denies urinary symptoms such as urgency, flank pain, hematuria or n/v, fever, chills.  Pt denies chest pain, increased sob or doe, wheezing, orthopnea, PND, increased LE swelling, palpitations, dizziness or syncope.   Pt denies polydipsia, polyuria, or new focal neuro s/s.    Pt denies fever, night sweats, loss of appetite, or other constitutional symptoms, except has had wt loss with recent pulmonary illness x 3 mo.  Remains on home o2.  ?      ?Wt Readings from Last 3 Encounters:  ?08/14/21 250 lb (113.4 kg)  ?07/25/21 255 lb (115.7 kg)  ?06/26/21 259 lb (117.5 kg)  ? ?BP Readings from Last 3 Encounters:  ?08/14/21 120/76  ?07/25/21 132/84  ?07/01/21 (!) 165/99  ? ?      ?Past Medical History:  ?Diagnosis Date  ? Allergy   ? Anxiety   ? Arthritis   ? no cartlidge in knees  ? Asthma   ? Bradycardia   ? a. atenolol stopped due to this, HR 40s.  ? 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 left  ? CAD in native artery   ? a.  CAD s/p RCA stent 2000 with residual mild-mod disease of left system 2001.  ? Chronic atrial fibrillation (HCC)   ? COPD (chronic obstructive pulmonary disease) ([facility])   ? Essential hypertension 03/24/2015  ? Gout   ? post operative  ? Hx of radiation therapy 04/02/11 -05/20/11  ? left breast  ? Hyperlipidemia   ? Hypothyroidism 03/24/2015  ? Incontinence of urine   ? Malignant neoplasm of upper-outer quadrant of female breast ([facility]) 12/26/2010  ? Osteoporosis 03/24/2015  ? Peripheral neuropathy 03/24/2015  ? Plantar fasciitis   ? Sleep apnea   ? ?Past Surgical History:  ?Procedure Laterality Date  ? BREAST LUMPECTOMY W/ NEEDLE LOCALIZATION  01/29/2011  ? Left with [facility] Dr Margot Chimes  ? CHOLECYSTECTOMY  1955  ? CORONARY ANGIOPLASTY WITH STENT  PLACEMENT  2000  ? DILATION AND CURETTAGE OF UTERUS  1976  ? and 1996  ? HAND SURGERY  1992  ? tendons between thumb and forefinger  ? SKIN BIOPSY    ? 1994, 2006, 2008, 2010, 2011, 2011, 2012-  pre-cancerous  ? TONSILLECTOMY  1955  ? ? reports that she quit smoking about 58 years ago. Her smoking use included cigarettes. She has a 4.00 pack-year smoking history. She has never used smokeless tobacco. She reports that she does not drink alcohol and does not use drugs. ?family history includes Asthma in her sister; Cancer in her father; Heart attack in her brother and father; Heart disease in her brother and father. ?Allergies  ?Allergen Reactions  ? Crestor [Rosuvastatin Calcium]   ?  Severe liver function problems  ? Hydrocodone-Acetaminophen Anxiety and Itching  ? Lidocaine Hcl Other (See Comments)  ?  Novacaine:  Becomes very shaky  ? Lipitor [Atorvastatin Calcium] Other (See Comments)  ?  Elevated liver function   ? Procaine Other (See Comments)  ?  shaking  ? Rosuvastatin Other (See Comments)  ?  Severe liver function problems  ? Theophylline Other (See Comments)  ?  Becomes very  shaky ?skaking ?skaking  ? Vicodin [Hydrocodone-Acetaminophen] Itching  ?  Itching all over  ? Codeine Nausea Only, Anxiety and Other (See Comments)  ?  Also complained of dizziness.  Has same problems with oxycodone and hydrocodone ?Visual Disturbance, Balance Difficulty, Dizzy "Room Spinning; "Swimming" ?Balance, vision, nausea, dizzy, "swimming", "room spinning" ?Balance, vision, nausea, dizzy, "swimming", "room spinning"  ? Oxycodone-Acetaminophen Nausea Only, Anxiety and Other (See Comments)  ?  Visual Disturbance, Balance Difficulty, Dizzy "Room Spinning; "Swimming"  ? Propoxyphene Anxiety, Nausea Only and Other (See Comments)  ?  Visual Disturbance, Balance Difficulty, Dizzy "Room Spinning; "Swimming" ?Balance, vision, nausea, dizzy, "swimming", "room spinning" ?Balance, vision, nausea, dizzy, "swimming, "room spinning"  ?  Quinine Derivatives Nausea Only  ?  dizzy  ? Sulfa Antibiotics Nausea Only  ?  Also complained of dizziness  ? Ephedrine Other (See Comments)  ?  shaking  ? Other Other (See Comments)  ?  Quinine causes nausea, dizzy  ? Oxycodone Other (See Comments)  ?  Balance, vision, nausea, dizzy, "swimming", "room spinning"  ? Quinine Other (See Comments)  ? Epinephrine Other (See Comments)  ?  Patient becomes "shaky" ?shaking ?shaking  ? Penicillins Rash  ?  Pt reported all over body rash in 1958 ?Has patient had a PCN reaction causing immediate rash, facial/tongue/throat swelling, SOB or lightheadedness with hypotension:Yes ?Has patient had a PCN reaction causing severe rash involving mucus membranes or skin necrosis: No ?Has patient had a PCN reaction that required hospitalization: No ?Has patient had a PCN reaction occurring within the last 10 years:No ? ?  ? Theophyllines Other (See Comments)  ?  Patient becomes "shaky"  ? ?Current Outpatient Medications on File Prior to Visit  ?Medication Sig Dispense Refill  ? albuterol (PROVENTIL) (2.5 MG/3ML) 0.083% nebulizer solution Take 3 mLs (2.5 mg total) by nebulization every 6 (six) hours as needed for wheezing or shortness of breath. 75 mL 12  ? ALLERGY RELIEF 180 MG tablet TAKE 1 TABLET BY MOUTH EVERY DAY 90 tablet 1  ? allopurinol (ZYLOPRIM) 300 MG tablet Take 300 mg by mouth daily.    ? ALPRAZolam (XANAX) 0.5 MG tablet TAKE 1 TO 2 TABLETS BY MOUTH TWICE DAILY AS NEEDED FOR ANXIETY OR SLEEP 60 tablet 2  ? Ascorbic Acid (VITAMIN C) 1000 MG tablet Take 1,000 mg by mouth in the morning.    ? atorvastatin (LIPITOR) 20 MG tablet Take 1 tablet (20 mg total) by mouth daily. 90 tablet 3  ? azelastine (ASTELIN) 0.1 % nasal spray Place 2 sprays into both nostrils daily. Use in each nostril as directed (Patient taking differently: Place 2 sprays into both nostrils in the morning.) 30 mL 5  ? B Complex Vitamins (VITAMIN B COMPLEX PO) Take 1 tablet by mouth daily.    ? Biotin 5000 MCG  CAPS Take 5,000 mcg by mouth daily.     ? cholecalciferol (VITAMIN D) 1000 UNITS tablet Take 1,000 Units by mouth daily.    ? Choline Dihydrogen Citrate (CHOLINE CITRATE PO) Take 1 capsule by mouth in the morning.    ? co-enzyme Q-10 50 MG capsule Take 100 mg by mouth daily.    ? colchicine 0.6 MG tablet Take 0.6 mg by mouth daily as needed (flare  up).     ? Cyanocobalamin (VITAMIN B-12 PO) Take 1 tablet by mouth daily.    ? cycloSPORINE (RESTASIS) 0.05 % ophthalmic emulsion Apply 1 drop to eye 2 (two) times daily as needed (allergies).    ?  EPINEPHrine 0.3 mg/0.3 mL IJ SOAJ injection Inject 0.3 mg into the muscle as needed for anaphylaxis.    ? escitalopram (LEXAPRO) 10 MG tablet TAKE 1 TABLET(10 MG) BY MOUTH DAILY (Patient taking differently: Take 10 mg by mouth in the morning.) 90 tablet 2  ? ezetimibe (ZETIA) 10 MG tablet Take 10 mg by mouth 2 (two) times a week. Take on Tues & Fri    ? fluticasone (FLONASE) 50 MCG/ACT nasal spray Place 1 spray into both nostrils every morning.    ? gabapentin (NEURONTIN) 300 MG capsule Take 1 capsule (300 mg total) by mouth 3 (three) times daily. (Patient taking differently: Take 300 mg by mouth at bedtime.) 270 capsule 1  ? Homeopathic Products (ARNICARE) GEL Apply 1 application topically daily as needed (soreness).    ? hydrochlorothiazide (HYDRODIURIL) 25 MG tablet TAKE 2 TABLETS(50 MG) BY MOUTH DAILY 180 tablet 1  ? levalbuterol (XOPENEX HFA) 45 MCG/ACT inhaler Inhale 2 puffs into the lungs every 6 (six) hours as needed for wheezing or shortness of breath. 3 Inhaler 3  ? levocetirizine (XYZAL) 5 MG tablet Take 1 tablet (5 mg total) by mouth every evening. 30 tablet 11  ? levothyroxine (SYNTHROID) 75 MCG tablet TAKE 1 TABLET(75 MCG) BY MOUTH DAILY 90 tablet 1  ? metoprolol succinate (TOPROL-XL) 25 MG 24 hr tablet TAKE 1/2 TABLET(12.5 MG) BY MOUTH DAILY 45 tablet 3  ? montelukast (SINGULAIR) 10 MG tablet Take 1 tablet (10 mg total) by mouth at bedtime. 90 tablet 1  ?  nitrofurantoin (MACRODANTIN) 50 MG capsule Take 50 mg by mouth 3 (three) times a week. Take three days a week (MWF)    ? Omega-3 Fatty Acids (FISH OIL) 1000 MG CAPS Take 1 capsule by mouth daily.    ? ondansetron (ZOFRA

## 2021-08-18 DIAGNOSIS — J441 Chronic obstructive pulmonary disease with (acute) exacerbation: Secondary | ICD-10-CM | POA: Diagnosis not present

## 2021-08-18 DIAGNOSIS — J9601 Acute respiratory failure with hypoxia: Secondary | ICD-10-CM | POA: Diagnosis not present

## 2021-08-19 ENCOUNTER — Encounter: Payer: Self-pay | Admitting: Internal Medicine

## 2021-08-19 DIAGNOSIS — R634 Abnormal weight loss: Secondary | ICD-10-CM | POA: Insufficient documentation

## 2021-08-19 DIAGNOSIS — J9611 Chronic respiratory failure with hypoxia: Secondary | ICD-10-CM | POA: Insufficient documentation

## 2021-08-19 NOTE — Assessment & Plan Note (Signed)
Stable overall, cont current hct ?

## 2021-08-19 NOTE — Assessment & Plan Note (Signed)
prob uti - Mild to mod, for antibx course, urine cx,  to f/u any worsening symptoms or concerns  ?

## 2021-08-19 NOTE — Assessment & Plan Note (Signed)
Likely reactive due to recent illness, pt decilnes furhter eval,  to f/u any worsening symptoms or concerns ?

## 2021-08-19 NOTE — Patient Instructions (Signed)
Please take all new medication as prescribed 

## 2021-08-19 NOTE — Assessment & Plan Note (Signed)
Stable, to f/u pulm as planned ?

## 2021-08-22 ENCOUNTER — Other Ambulatory Visit: Payer: Self-pay | Admitting: Internal Medicine

## 2021-08-24 ENCOUNTER — Ambulatory Visit: Payer: Medicare Other | Admitting: Family Medicine

## 2021-08-27 ENCOUNTER — Encounter: Payer: Self-pay | Admitting: Nurse Practitioner

## 2021-08-27 ENCOUNTER — Ambulatory Visit: Payer: Medicare Other | Admitting: Nurse Practitioner

## 2021-08-27 ENCOUNTER — Ambulatory Visit (INDEPENDENT_AMBULATORY_CARE_PROVIDER_SITE_OTHER): Payer: Medicare Other

## 2021-08-27 VITALS — BP 120/74 | HR 77 | Temp 97.6°F | Ht 65.0 in | Wt 250.0 lb

## 2021-08-27 DIAGNOSIS — J45909 Unspecified asthma, uncomplicated: Secondary | ICD-10-CM

## 2021-08-27 DIAGNOSIS — J9601 Acute respiratory failure with hypoxia: Secondary | ICD-10-CM

## 2021-08-27 DIAGNOSIS — G4733 Obstructive sleep apnea (adult) (pediatric): Secondary | ICD-10-CM

## 2021-08-27 DIAGNOSIS — Z9989 Dependence on other enabling machines and devices: Secondary | ICD-10-CM | POA: Diagnosis not present

## 2021-08-27 DIAGNOSIS — J189 Pneumonia, unspecified organism: Secondary | ICD-10-CM

## 2021-08-27 DIAGNOSIS — J301 Allergic rhinitis due to pollen: Secondary | ICD-10-CM | POA: Diagnosis not present

## 2021-08-27 DIAGNOSIS — R918 Other nonspecific abnormal finding of lung field: Secondary | ICD-10-CM | POA: Diagnosis not present

## 2021-08-27 NOTE — Assessment & Plan Note (Signed)
Well-controlled on current regimen. ?

## 2021-08-27 NOTE — Assessment & Plan Note (Signed)
Compensated on current regimen.  We will continue with triple therapy. ?

## 2021-08-27 NOTE — Patient Instructions (Addendum)
Continue Trelegy 1 puff daily. Brush tongue and rinse mouth afterwards  ?Continue Albuterol inhaler 2 puffs or 3 mL neb every 6 hours as needed for shortness of breath or wheezing. Notify if symptoms persist despite rescue inhaler/neb use. ?Continue astelin nasal spray 2 sprays each nostril Twice daily as needed for nasal congestion/drainage ?Continue flonase nasal spray 1 spray each nostril daily ?Continue daily over the counter allergy medicine daily  ?Continue singulair 10 mg At bedtime  ? ?Monitor oxygen levels at home on room air. Notify if you are dropping below 88-90%. ? ?Continue CPAP at night, minimum of 4-6 hours ? ?Chest x ray today.  ? ?Follow up in 3 months with Dr. Lamonte Sakai or Alanson Aly. If symptoms do not improve or worsen, please contact office for sooner follow up or seek emergency care. ?

## 2021-08-27 NOTE — Assessment & Plan Note (Signed)
Resolved.  Walking oximetry today with saturations in the 90s on room air.  Advised to monitor at home and notify if <88-90%.  ?

## 2021-08-27 NOTE — Progress Notes (Signed)
? ?'@Patient'$  ID: [name], female    DOB: Oct 02, 1935, 86 y.o.   MRN: [id number] ? ?Chief Complaint  ?Patient presents with  ? Follow-up  ?  She is doing well and denies any shortness of breath, coughing or wheezing.   ? ? ?Referring provider: ?Biagio Borg, MD ? ?HPI: ?86 year old female, former remote smoker followed for asthma, mixed obstructive and restrictive airway disease and allergic rhinitis.  She is a patient Dr. Agustina Caroli last seen in office on 07/25/2021.  Past medical history significant for OSA on CPAP (followed by Dr. Elenore Rota), CAD, CHF, hypertension, sinus node dysfunction, A-fib on Xarelto, dysphagia, hypothyroid, peripheral neuropathy, osteoarthritis, stage Ia left breast cancer 2012, HLD, obesity, gout, prediabetes. ? ?TEST/EVENTS:  ?06/13/2021 echocardiogram: EF 60 to 65%.  LVH.  Unable to determine diastolic function.  Moderately elevated PASP.   ?06/15/2021 CTA chest: Satisfactory opacification of the pulmonary arteries with no evidence of PE.  There is enlargement of the pulmonary artery, suggesting pulmonary hypertension.  Atherosclerosis is present.  Multifocal areas of airspace consolidation throughout both lungs, mostly groundglass appearance, concerning for multifocal pneumonia.  Stable left renal cyst and 10 mm left renal stone.  Small hiatal hernia present. ? ?06/26/2021: OV with Lewayne Pauley NP for hospital follow-up after being treated for multifocal pneumonia.  Reported having new fevers up to Tmax of 102.  Shortness of breath was worse.  Also coughing up more purulent sputum.  Given worsening of symptoms, patient was taken to CXR which showed persistent airspace opacities.  When she returned to the room from chest x-ray she was in mild respiratory distress, requiring supplemental O2 due to desaturations to the 70s.  EMS was contacted for transport to ED as patient had failed outpatient therapy. ? ?07/25/2021: OV with Dr. Lamonte Sakai.  Discharged on 07/01/2021. Prior to this visit, she was treated with  another course of prednisone after contacting the office on 07/16/2021 for suspected upper airway irritation and postinfectious cough. Reported starting to feel better.  Concerned for coming off prednisone given allergy season.  Discussed potential side effects and concern for recurrent pneumonia with continued prednisone use.  Continued on prednisone 20 mg for 5 days then decrease to 10 mg daily with plans to taper off further at follow-up.  Advised to continue supplemental oxygen with plans for walking oximetry at next visit.  Continue allergy treatment regimen.  Suspected that her cough was primarily upper airway irritation.  Plans to repeat chest x-ray in 1 month ? ?08/27/2021: Today-follow-up ?Patient presents today with daughter for follow-up.  She reports feeling significantly better.  Looks much better than she did the last time I saw her.  Feels as though her energy is improving.  Cough is resolved; felt as though the extra course of prednisone helped.  She is now off of prednisone entirely.  Has not had any issues since coming off with her breathing or other symptoms.  Feels like her activity tolerance has also improved.  She has been cleaning in a sorting her house.  No recurrence of fevers.  She has had recent weight loss of approximately 35-40 pounds.  Was seen by her PCP who attributed this to recent illness.  Reports that it has stabilized and her appetite is started to return over the last few weeks.  Denies any recent hemoptysis or night sweats.  Allergy symptoms currently well controlled.  Rarely has to use albuterol.  She is currently still on supplemental oxygen at 3 L/min.  Reports that her oxygen has  not dropped below 97% even on room air at home.  Continues to use her CPAP at night with oxygen bled through.  No concerns or complaints today. ? ?Allergies  ?Allergen Reactions  ? Crestor [Rosuvastatin Calcium]   ?  Severe liver function problems  ? Hydrocodone-Acetaminophen Anxiety and Itching  ?  Lidocaine Hcl Other (See Comments)  ?  Novacaine:  Becomes very shaky  ? Lipitor [Atorvastatin Calcium] Other (See Comments)  ?  Elevated liver function   ? Procaine Other (See Comments)  ?  shaking  ? Rosuvastatin Other (See Comments)  ?  Severe liver function problems  ? Theophylline Other (See Comments)  ?  Becomes very shaky ?skaking ?skaking  ? Vicodin [Hydrocodone-Acetaminophen] Itching  ?  Itching all over  ? Codeine Nausea Only, Anxiety and Other (See Comments)  ?  Also complained of dizziness.  Has same problems with oxycodone and hydrocodone ?Visual Disturbance, Balance Difficulty, Dizzy "Room Spinning; "Swimming" ?Balance, vision, nausea, dizzy, "swimming", "room spinning" ?Balance, vision, nausea, dizzy, "swimming", "room spinning"  ? Ephedrine Other (See Comments)  ?  shaking  ? Other Other (See Comments)  ?  Quinine causes nausea, dizzy  ? Oxycodone Other (See Comments)  ?  Balance, vision, nausea, dizzy, "swimming", "room spinning"  ? Oxycodone-Acetaminophen Nausea Only, Anxiety and Other (See Comments)  ?  Visual Disturbance, Balance Difficulty, Dizzy "Room Spinning; "Swimming"  ? Propoxyphene Anxiety, Nausea Only and Other (See Comments)  ?  Visual Disturbance, Balance Difficulty, Dizzy "Room Spinning; "Swimming" ?Balance, vision, nausea, dizzy, "swimming", "room spinning" ?Balance, vision, nausea, dizzy, "swimming, "room spinning"  ? Quinine Derivatives Nausea Only  ?  dizzy  ? Sulfa Antibiotics Nausea Only  ?  Also complained of dizziness  ? Epinephrine Other (See Comments)  ?  Patient becomes "shaky" ?shaking ?shaking  ? Penicillins Rash  ?  Pt reported all over body rash in 1958 ?Has patient had a PCN reaction causing immediate rash, facial/tongue/throat swelling, SOB or lightheadedness with hypotension:Yes ?Has patient had a PCN reaction causing severe rash involving mucus membranes or skin necrosis: No ?Has patient had a PCN reaction that required hospitalization: No ?Has patient had a PCN  reaction occurring within the last 10 years:No ? ?  ? Quinine Other (See Comments)  ? Theophyllines Other (See Comments)  ?  Patient becomes "shaky"  ? ? ?Immunization History  ?Administered Date(s) Administered  ? Fluad Quad(high Dose 65+) 03/30/2019, 01/21/2020, 04/24/2021  ? Influenza Split 04/06/2009, 04/05/2010, 01/10/2011, 02/10/2012, 04/23/2013  ? Influenza, High Dose Seasonal PF 03/20/2017, 01/19/2018  ? Influenza-Unspecified 05/08/2015, 01/23/2016  ? PFIZER(Purple Top)SARS-COV-2 Vaccination 06/11/2019, 07/06/2019, 02/28/2020  ? Pneumococcal Conjugate-13 06/01/2013, 03/20/2017  ? Pneumococcal Polysaccharide-23 03/16/2003, 01/23/2016  ? Td 12/08/2009  ? ? ?Past Medical History:  ?Diagnosis Date  ? Allergy   ? Anxiety   ? Arthritis   ? no cartlidge in knees  ? Asthma   ? Bradycardia   ? a. atenolol stopped due to this, HR 40s.  ? 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 left  ? CAD in native artery   ? a.  CAD s/p RCA stent 2000 with residual mild-mod disease of left system 2001.  ? Chronic atrial fibrillation (HCC)   ? COPD (chronic obstructive pulmonary disease) ([place])   ? Essential hypertension 03/24/2015  ? Gout   ? post operative  ? Hx of radiation therapy 04/02/11 -05/20/11  ? left breast  ? Hyperlipidemia   ? Hypothyroidism 03/24/2015  ? Incontinence of  urine   ? Malignant neoplasm of upper-outer quadrant of female breast ([facility]) 12/26/2010  ? Osteoporosis 03/24/2015  ? Peripheral neuropathy 03/24/2015  ? Plantar fasciitis   ? Sleep apnea   ? ? ?Tobacco History: ?Social History  ? ?Tobacco Use  ?Smoking Status Former  ? Packs/day: 1.00  ? Years: 4.00  ? Pack years: 4.00  ? Types: Cigarettes  ? Quit date: 12/05/1962  ? Years since quitting: 58.7  ?Smokeless Tobacco Never  ? ?Counseling given: Not Answered ? ? ?Outpatient Medications Prior to Visit  ?Medication Sig Dispense Refill  ? albuterol (PROVENTIL) (2.5 MG/3ML) 0.083% nebulizer solution Take 3 mLs (2.5 mg total) by nebulization every 6 (six)  hours as needed for wheezing or shortness of breath. 75 mL 12  ? ALLERGY RELIEF 180 MG tablet TAKE 1 TABLET BY MOUTH EVERY DAY 90 tablet 1  ? allopurinol (ZYLOPRIM) 300 MG tablet Take 300 mg by mouth daily.

## 2021-08-27 NOTE — Assessment & Plan Note (Signed)
Significant improvement since being discharged and last seen in office.  Cough has resolved and shortness of breath has significantly improved.  Oxygen requirement has also improved-able to maintain saturations on room air in the 90s.  CXR today showed improved patchy interstitial opacities throughout both lungs, likely scarring given clinical improvement. ? ?Patient Instructions  ?Continue Trelegy 1 puff daily. Brush tongue and rinse mouth afterwards  ?Continue Albuterol inhaler 2 puffs or 3 mL neb every 6 hours as needed for shortness of breath or wheezing. Notify if symptoms persist despite rescue inhaler/neb use. ?Continue astelin nasal spray 2 sprays each nostril Twice daily as needed for nasal congestion/drainage ?Continue flonase nasal spray 1 spray each nostril daily ?Continue daily over the counter allergy medicine daily  ?Continue singulair 10 mg At bedtime  ? ?Monitor oxygen levels at home on room air. Notify if you are dropping below 88-90%. ? ?Continue CPAP at night, minimum of 4-6 hours ? ?Chest x ray today.  ? ?Follow up in 3 months with Dr. Lamonte Sakai or Alanson Aly. If symptoms do not improve or worsen, please contact office for sooner follow up or seek emergency care. ? ? ?

## 2021-08-27 NOTE — Assessment & Plan Note (Signed)
Restarted CPAP since discharge.  Has had compliance since.  Feeling well and continues to receive good benefit. ?

## 2021-08-28 DIAGNOSIS — M1991 Primary osteoarthritis, unspecified site: Secondary | ICD-10-CM | POA: Diagnosis not present

## 2021-08-28 DIAGNOSIS — M1009 Idiopathic gout, multiple sites: Secondary | ICD-10-CM | POA: Diagnosis not present

## 2021-08-28 DIAGNOSIS — Z79899 Other long term (current) drug therapy: Secondary | ICD-10-CM | POA: Diagnosis not present

## 2021-08-31 NOTE — Progress Notes (Signed)
Please notify patient that CXR showed previous opacities are improving; likely scarring from recent pneumonia given her clinical improvement. Thanks!

## 2021-08-31 NOTE — Progress Notes (Signed)
Called pt and there was no answer-LMTCB °

## 2021-09-13 DIAGNOSIS — H53421 Scotoma of blind spot area, right eye: Secondary | ICD-10-CM | POA: Diagnosis not present

## 2021-09-13 DIAGNOSIS — D3131 Benign neoplasm of right choroid: Secondary | ICD-10-CM | POA: Diagnosis not present

## 2021-09-13 DIAGNOSIS — G4733 Obstructive sleep apnea (adult) (pediatric): Secondary | ICD-10-CM | POA: Diagnosis not present

## 2021-09-13 DIAGNOSIS — H524 Presbyopia: Secondary | ICD-10-CM | POA: Diagnosis not present

## 2021-09-13 DIAGNOSIS — H35373 Puckering of macula, bilateral: Secondary | ICD-10-CM | POA: Diagnosis not present

## 2021-09-13 DIAGNOSIS — H40013 Open angle with borderline findings, low risk, bilateral: Secondary | ICD-10-CM | POA: Diagnosis not present

## 2021-09-17 DIAGNOSIS — J9601 Acute respiratory failure with hypoxia: Secondary | ICD-10-CM | POA: Diagnosis not present

## 2021-09-17 DIAGNOSIS — J441 Chronic obstructive pulmonary disease with (acute) exacerbation: Secondary | ICD-10-CM | POA: Diagnosis not present

## 2021-09-19 ENCOUNTER — Telehealth: Payer: Self-pay | Admitting: Internal Medicine

## 2021-09-19 DIAGNOSIS — R739 Hyperglycemia, unspecified: Secondary | ICD-10-CM

## 2021-09-19 DIAGNOSIS — E559 Vitamin D deficiency, unspecified: Secondary | ICD-10-CM

## 2021-09-19 DIAGNOSIS — I10 Essential (primary) hypertension: Secondary | ICD-10-CM

## 2021-09-19 DIAGNOSIS — E538 Deficiency of other specified B group vitamins: Secondary | ICD-10-CM

## 2021-09-19 NOTE — Telephone Encounter (Signed)
PT calls today in regards to their upcoming appointment with Dr.John on 06/20. PT usually gets labs done prior to her visits with Dr.John and would like labs sent in ASAP for her usual blood work as well as checking her creatinine levels. PT had visited the rheumatologist and was asked if these had ever been checked before and that her levels were high.  ? ?PT also leaves FYI that she has been having finding blood in her urine of a dark rusty color but this has been off and on. She had seen where this could be a symptom of creatinine levels being off. If that order could be put in by the end of the day or tomorrow she would be fine with going by the [facility] while out for another visit. ? ?CB: [phone] ?

## 2021-09-20 NOTE — Telephone Encounter (Signed)
Ok labs ordered 

## 2021-09-20 NOTE — Telephone Encounter (Signed)
Patient notified via voicemail.

## 2021-09-25 ENCOUNTER — Ambulatory Visit (INDEPENDENT_AMBULATORY_CARE_PROVIDER_SITE_OTHER): Payer: Medicare Other | Admitting: Emergency Medicine

## 2021-09-25 ENCOUNTER — Ambulatory Visit (INDEPENDENT_AMBULATORY_CARE_PROVIDER_SITE_OTHER): Payer: Medicare Other

## 2021-09-25 ENCOUNTER — Encounter: Payer: Self-pay | Admitting: Emergency Medicine

## 2021-09-25 VITALS — BP 124/68 | HR 94 | Temp 97.5°F | Ht 65.0 in | Wt 242.0 lb

## 2021-09-25 DIAGNOSIS — R0602 Shortness of breath: Secondary | ICD-10-CM

## 2021-09-25 DIAGNOSIS — R053 Chronic cough: Secondary | ICD-10-CM | POA: Diagnosis not present

## 2021-09-25 DIAGNOSIS — R634 Abnormal weight loss: Secondary | ICD-10-CM

## 2021-09-25 DIAGNOSIS — R062 Wheezing: Secondary | ICD-10-CM | POA: Diagnosis not present

## 2021-09-25 DIAGNOSIS — J45909 Unspecified asthma, uncomplicated: Secondary | ICD-10-CM | POA: Diagnosis not present

## 2021-09-25 DIAGNOSIS — J9601 Acute respiratory failure with hypoxia: Secondary | ICD-10-CM | POA: Diagnosis not present

## 2021-09-25 DIAGNOSIS — R059 Cough, unspecified: Secondary | ICD-10-CM | POA: Diagnosis not present

## 2021-09-25 NOTE — Progress Notes (Signed)
Subjective:    Patient ID: [name], female    DOB: [birth year], 86 y.o.   MRN: [id number]  Asthma She complains of cough, shortness of breath and wheezing. Associated symptoms include a fever. Pertinent negatives include no ear pain, headaches, postnasal drip, rhinorrhea, sneezing, sore throat or trouble swallowing. Her past medical history is significant for asthma.   Acute OV 09/25/21 --86 year old woman with mixed obstruction and restriction, probably fixed asthma.  Also with OSA on CPAP, allergic rhinitis, history of breast cancer, CAD, atrial fibrillation with diastolic dysfunction.  Managed on fexofenadine, Xyzal, Singulair, Astelin nasal spray, Mucinex DM, fluticasone nasal spray.  She is on [employer].  She was doing well in April following admission for dyspnea and B infiltrates, malaise. That was treated as PNA. Over the last couple weeks she has had progressive SOB, noted hypoxemia and needing her O2 (desats).  She has intermittently been confused. A lot of nausea. Some exertional weakness and dyspnea walking through the house.  The symptoms that she is experiencing now are reminiscent of how she was doing before she was admitted to the hospital last time.  No significant change in cough-remains dry, no sputum production.  She tells me she is worried she has pneumonia again.  Of note she was on prednisone through most of February and March, question whether she is missing this.   Review of Systems  Constitutional:  Positive for fever and unexpected weight change.  HENT:  Negative for congestion, dental problem, ear pain, nosebleeds, postnasal drip, rhinorrhea, sinus pressure, sneezing, sore throat and trouble swallowing.   Eyes:  Negative for redness and itching.  Respiratory:  Positive for cough, shortness of breath and wheezing. Negative for chest tightness.   Cardiovascular:  Negative for palpitations and leg swelling.  Gastrointestinal:  Positive for nausea. Negative for  vomiting.  Genitourinary:  Negative for dysuria.  Musculoskeletal:  Negative for joint swelling.  Skin:  Negative for rash.  Neurological:  Negative for headaches.  Hematological:  Does not bruise/bleed easily.  Psychiatric/Behavioral:  Positive for confusion. Negative for dysphoric mood. The patient is not nervous/anxious.    Past Medical History:  Diagnosis Date   Allergy    Anxiety    Arthritis    no cartlidge in knees   Asthma    Bradycardia    a. atenolol stopped due to this, HR 40s.   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  CAD s/p RCA stent 2000 with residual mild-mod disease of left system 2001.   Chronic atrial fibrillation (HCC)    COPD (chronic obstructive pulmonary disease) (HCC)    Essential hypertension 03/24/2015   Gout    post operative   Hx of radiation therapy 04/02/11 -05/20/11   left breast   Hyperlipidemia    Hypothyroidism 03/24/2015   Incontinence of urine    Malignant neoplasm of upper-outer quadrant of female breast ([place]) 12/26/2010   Osteoporosis 03/24/2015   Peripheral neuropathy 03/24/2015   Plantar fasciitis    Sleep apnea      Family History  Problem Relation Age of Onset   Heart disease Father    Cancer Father        intestinal polyps   Heart attack Father    Asthma Sister    Heart disease Brother    Heart attack Brother      Social History   Socioeconomic History   Marital status: Widowed  Spouse name: Not on file   Number of children: Not on file   Years of education: Not on file   Highest education level: Not on file  Occupational History   Not on file  Tobacco Use   Smoking status: Former    Packs/day: 1.00    Years: 4.00    Pack years: 4.00    Types: Cigarettes    Quit date: 12/05/1962    Years since quitting: 58.8   Smokeless tobacco: Never  Vaping Use   Vaping Use: Never used  Substance and Sexual Activity   Alcohol use: No   Drug use: No   Sexual activity: Not on  file  Other Topics Concern   Not on file  Social History Narrative   Right handed   Social Determinants of Health   Financial Resource Strain: Not on file  Food Insecurity: Not on file  Transportation Needs: Not on file  Physical Activity: Not on file  Stress: Not on file  Social Connections: Not on file  Intimate Partner Violence: Not on file  No smoker exposure.  No occupational exposures  Allergies  Allergen Reactions   Crestor [Rosuvastatin Calcium]     Severe liver function problems   Hydrocodone-Acetaminophen Anxiety and Itching   Lidocaine Hcl Other (See Comments)    Novacaine:  Becomes very shaky   Lipitor [Atorvastatin Calcium] Other (See Comments)    Elevated liver function    Procaine Other (See Comments)    shaking   Rosuvastatin Other (See Comments)    Severe liver function problems   Theophylline Other (See Comments)    Becomes very shaky skaking skaking   Vicodin [Hydrocodone-Acetaminophen] Itching    Itching all over   Codeine Nausea Only, Anxiety and Other (See Comments)    Also complained of dizziness.  Has same problems with oxycodone and hydrocodone Visual Disturbance, Balance Difficulty, Dizzy "Room Spinning; "Swimming" Balance, vision, nausea, dizzy, "swimming", "room spinning" Balance, vision, nausea, dizzy, "swimming", "room spinning"   Ephedrine Other (See Comments)    shaking   Other Other (See Comments)    Quinine causes nausea, dizzy   Oxycodone Other (See Comments)    Balance, vision, nausea, dizzy, "swimming", "room spinning"   Oxycodone-Acetaminophen Nausea Only, Anxiety and Other (See Comments)    Visual Disturbance, Balance Difficulty, Dizzy "Room Spinning; "Swimming"   Propoxyphene Anxiety, Nausea Only and Other (See Comments)    Visual Disturbance, Balance Difficulty, Dizzy "Room Spinning; "Swimming" Balance, vision, nausea, dizzy, "swimming", "room spinning" Balance, vision, nausea, dizzy, "swimming, "room spinning"   Quinine  Derivatives Nausea Only    dizzy   Sulfa Antibiotics Nausea Only    Also complained of dizziness   Epinephrine Other (See Comments)    Patient becomes "shaky" shaking shaking   Penicillins Rash    Pt reported all over body rash in 1958 Has patient had a PCN reaction causing immediate rash, facial/tongue/throat swelling, SOB or lightheadedness with hypotension:Yes Has patient had a PCN reaction causing severe rash involving mucus membranes or skin necrosis: No Has patient had a PCN reaction that required hospitalization: No Has patient had a PCN reaction occurring within the last 10 years:No     Quinine Other (See Comments)   Theophyllines Other (See Comments)    Patient becomes "shaky"     Outpatient Medications Prior to Visit  Medication Sig Dispense Refill   albuterol (PROVENTIL) (2.5 MG/3ML) 0.083% nebulizer solution Take 3 mLs (2.5 mg total) by nebulization every 6 (six) hours as needed for  wheezing or shortness of breath. 75 mL 12   ALLERGY RELIEF 180 MG tablet TAKE 1 TABLET BY MOUTH EVERY DAY 90 tablet 1   allopurinol (ZYLOPRIM) 300 MG tablet Take 300 mg by mouth daily.     ALPRAZolam (XANAX) 0.5 MG tablet TAKE 1 TO 2 TABLETS BY MOUTH TWICE DAILY AS NEEDED FOR ANXIETY OR SLEEP 60 tablet 2   Ascorbic Acid (VITAMIN C) 1000 MG tablet Take 1,000 mg by mouth in the morning.     atorvastatin (LIPITOR) 20 MG tablet Take 1 tablet (20 mg total) by mouth daily. 90 tablet 3   azelastine (ASTELIN) 0.1 % nasal spray Place 2 sprays into both nostrils daily. Use in each nostril as directed (Patient taking differently: Place 2 sprays into both nostrils in the morning.) 30 mL 5   B Complex Vitamins (VITAMIN B COMPLEX PO) Take 1 tablet by mouth daily.     Biotin 5000 MCG CAPS Take 5,000 mcg by mouth daily.      cholecalciferol (VITAMIN D) 1000 UNITS tablet Take 1,000 Units by mouth daily.     Choline Dihydrogen Citrate (CHOLINE CITRATE PO) Take 1 capsule by mouth in the morning.     co-enzyme  Q-10 50 MG capsule Take 100 mg by mouth daily.     colchicine 0.6 MG tablet Take 0.6 mg by mouth daily as needed (flare  up).      Cyanocobalamin (VITAMIN B-12 PO) Take 1 tablet by mouth daily.     cycloSPORINE (RESTASIS) 0.05 % ophthalmic emulsion Apply 1 drop to eye 2 (two) times daily as needed (allergies).     dextromethorphan-guaiFENesin (MUCINEX DM) 30-600 MG 12hr tablet Take 1 tablet by mouth 2 (two) times daily.     EPINEPHrine 0.3 mg/0.3 mL IJ SOAJ injection Inject 0.3 mg into the muscle as needed for anaphylaxis.     escitalopram (LEXAPRO) 10 MG tablet TAKE 1 TABLET(10 MG) BY MOUTH DAILY 90 tablet 3   ezetimibe (ZETIA) 10 MG tablet Take 10 mg by mouth 2 (two) times a week. Take on Tues & Fri     fluticasone (FLONASE) 50 MCG/ACT nasal spray Place 1 spray into both nostrils every morning.     fluticasone-salmeterol (WIXELA INHUB) 500-50 MCG/ACT AEPB Inhale 1 puff into the lungs in the morning and at bedtime.     gabapentin (NEURONTIN) 300 MG capsule Take 1 capsule (300 mg total) by mouth 3 (three) times daily. (Patient taking differently: Take 300 mg by mouth at bedtime.) 270 capsule 1   Homeopathic Products (ARNICARE) GEL Apply 1 application topically daily as needed (soreness).     hydrochlorothiazide (HYDRODIURIL) 25 MG tablet TAKE 2 TABLETS(50 MG) BY MOUTH DAILY 180 tablet 1   levalbuterol (XOPENEX HFA) 45 MCG/ACT inhaler Inhale 2 puffs into the lungs every 6 (six) hours as needed for wheezing or shortness of breath. 3 Inhaler 3   levocetirizine (XYZAL) 5 MG tablet Take 1 tablet (5 mg total) by mouth every evening. 30 tablet 11   levothyroxine (SYNTHROID) 75 MCG tablet TAKE 1 TABLET(75 MCG) BY MOUTH DAILY 90 tablet 1   metoprolol succinate (TOPROL-XL) 25 MG 24 hr tablet TAKE 1/2 TABLET(12.5 MG) BY MOUTH DAILY 45 tablet 3   montelukast (SINGULAIR) 10 MG tablet Take 1 tablet (10 mg total) by mouth at bedtime. 90 tablet 1   nitrofurantoin (MACRODANTIN) 50 MG capsule Take 50 mg by mouth 3  (three) times a week. Take three days a week (MWF)     Omega-3 Fatty  Acids (FISH OIL) 1000 MG CAPS Take 1 capsule by mouth daily.     ondansetron (ZOFRAN) 4 MG tablet Take 1 tablet (4 mg total) by mouth every 8 (eight) hours as needed for nausea or vomiting. 40 tablet 3   potassium chloride SA (KLOR-CON M) 20 MEQ tablet TAKE 1 TABLET(20 MEQ) BY MOUTH DAILY 90 tablet 3   Pyridoxine HCl (VITAMIN B-6 PO) Take 1 tablet by mouth daily.     Pocola 420 MG/3.5ML SOCT INJECT 1 UNIT INTO THE SKIN EVERY 30 DAYS 3.5 mL 11   TART CHERRY PO Take 1 capsule by mouth in the morning and at bedtime.     VITAMIN E PO Take 1 tablet by mouth daily.     XARELTO 20 MG TABS tablet TAKE 1 TABLET(20 MG) BY MOUTH DAILY WITH SUPPER 90 tablet 1   Zinc 100 MG TABS Take 1 tablet by mouth in the morning.     TRELEGY ELLIPTA 200-62.5-25 MCG/ACT AEPB Inhale 1 puff into the lungs daily.     No facility-administered medications prior to visit.        Objective:   Physical Exam Vitals:   09/25/21 1622  BP: 124/68  Pulse: 94  Temp: (!) 97.5 F (36.4 C)  TempSrc: Oral  SpO2: 98%  Weight: 242 lb (109.8 kg)  Height: $Remove'5\' 5"'xthjbiO$  (1.651 m)   Gen: Pleasant, Obese woman, in no distress,  normal affect  ENT: No lesions,  mouth clear,  oropharynx clear, no active nasal drip  Neck: No JVD, no upper airway noise when breathing normally, able to phonate clearly.  She has loud expiratory stridor through all of expiration when she deep breathes  Lungs: No use of accessory muscles, no apparent wheeze, no crackles.  Some referred UA noise.   Cardiovascular: Irregular, distant  Musculoskeletal: No deformities, no cyanosis or clubbing  Neuro: alert, non focal  Skin: Warm, no lesions or rashes      Assessment & Plan:  Acute respiratory failure with hypoxia (HCC) Many of her symptoms sound constitutional in nature, although it does appear we have documented increased dyspnea and hypoxemia.  She is wearing her  oxygen reliably.  She has had bilateral infiltrates in the past associated with constitutional symptoms and dyspnea, treated as pneumonia, but she has no crackles on exam, no purulent sputum that would be consistent with CAP.  With a clear chest exam hard to ascribe her symptoms to progressive diastolic CHF (which we knows she is at risk for).  I will check a chest x-ray today. Further we assess more broadly, check CBC, BMP, TSH, random cortisol.  Question whether some of her symptoms may be due to adrenal insufficiency-she may miss the prednisone.  Could consider starting a maintenance dose going forward depending on how the work-up unfolds  Asthma No wheezing on exam today.  She has clear stridor with forced expiration.  She can be distracted when speaking and the stridor goes away.  Time spent 44 minutes   Baltazar Apo, MD, PhD 09/25/2021, 5:13 PM [facility] [phone] or if no answer [phone]

## 2021-09-25 NOTE — Assessment & Plan Note (Signed)
Many of her symptoms sound constitutional in nature, although it does appear we have documented increased dyspnea and hypoxemia.  She is wearing her oxygen reliably.  She has had bilateral infiltrates in the past associated with constitutional symptoms and dyspnea, treated as pneumonia, but she has no crackles on exam, no purulent sputum that would be consistent with CAP.  With a clear chest exam hard to ascribe her symptoms to progressive diastolic CHF (which we knows she is at risk for).  I will check a chest x-ray today. Further we assess more broadly, check CBC, BMP, TSH, random cortisol.  Question whether some of her symptoms may be due to adrenal insufficiency-she may miss the prednisone.  Could consider starting a maintenance dose going forward depending on how the work-up unfolds

## 2021-09-25 NOTE — Patient Instructions (Addendum)
We will check lab work (CBC, BMP, TSH, random cortisol) Chest x-ray today to compare with priors. Continue current medications as you have been taking them, including Wixela twice a day.  Rinse and gargle after using this. Use your oxygen with any and all exertion Follow with APP or Dr. Lamonte Sakai in 1 week so we can discuss lab work, review status and decide neck steps in your evaluation

## 2021-09-25 NOTE — Assessment & Plan Note (Signed)
No wheezing on exam today.  She has clear stridor with forced expiration.  She can be distracted when speaking and the stridor goes away.

## 2021-09-26 ENCOUNTER — Other Ambulatory Visit (INDEPENDENT_AMBULATORY_CARE_PROVIDER_SITE_OTHER): Payer: Medicare Other

## 2021-09-26 DIAGNOSIS — R634 Abnormal weight loss: Secondary | ICD-10-CM

## 2021-09-26 DIAGNOSIS — E538 Deficiency of other specified B group vitamins: Secondary | ICD-10-CM | POA: Diagnosis not present

## 2021-09-26 DIAGNOSIS — I10 Essential (primary) hypertension: Secondary | ICD-10-CM

## 2021-09-26 DIAGNOSIS — E559 Vitamin D deficiency, unspecified: Secondary | ICD-10-CM | POA: Diagnosis not present

## 2021-09-26 DIAGNOSIS — R053 Chronic cough: Secondary | ICD-10-CM

## 2021-09-26 DIAGNOSIS — R739 Hyperglycemia, unspecified: Secondary | ICD-10-CM

## 2021-09-26 DIAGNOSIS — R0602 Shortness of breath: Secondary | ICD-10-CM | POA: Diagnosis not present

## 2021-09-26 LAB — BASIC METABOLIC PANEL
BUN: 24 mg/dL — ABNORMAL HIGH (ref 6–23)
CO2: 32 mEq/L (ref 19–32)
Calcium: 9.2 mg/dL (ref 8.4–10.5)
Chloride: 100 mEq/L (ref 96–112)
Creatinine, Ser: 1.13 mg/dL (ref 0.40–1.20)
GFR: 44.81 mL/min — ABNORMAL LOW (ref >60.00)
Glucose, Bld: 120 mg/dL — ABNORMAL HIGH (ref 70–99)
Potassium: 3.3 mEq/L — ABNORMAL LOW (ref 3.5–5.1)
Sodium: 139 mEq/L (ref 135–145)

## 2021-09-26 LAB — CBC WITH DIFFERENTIAL/PLATELET
Basophils Absolute: 0 10*3/uL (ref 0.0–0.1)
Basophils Relative: 0.5 % (ref 0.0–3.0)
Eosinophils Absolute: 0.2 10*3/uL (ref 0.0–0.7)
Eosinophils Relative: 2.7 % (ref 0.0–5.0)
HCT: 34.8 % — ABNORMAL LOW (ref 36.0–46.0)
Hemoglobin: 11.4 g/dL — ABNORMAL LOW (ref 12.0–15.0)
Lymphocytes Relative: 10.5 % — ABNORMAL LOW (ref 12.0–46.0)
Lymphs Abs: 0.8 10*3/uL (ref 0.7–4.0)
MCHC: 32.8 g/dL (ref 30.0–36.0)
MCV: 99.4 fl (ref 78.0–100.0)
Monocytes Absolute: 0.7 10*3/uL (ref 0.1–1.0)
Monocytes Relative: 9.6 % (ref 3.0–12.0)
Neutro Abs: 5.8 10*3/uL (ref 1.4–7.7)
Neutrophils Relative %: 76.7 % (ref 43.0–77.0)
Platelets: 168 10*3/uL (ref 150.0–400.0)
RBC: 3.51 Mil/uL — ABNORMAL LOW (ref 3.87–5.11)
RDW: 17.1 % — ABNORMAL HIGH (ref 11.5–15.5)
WBC: 7.6 10*3/uL (ref 4.0–10.5)

## 2021-09-26 LAB — VITAMIN B12: Vitamin B-12: 373 pg/mL (ref 211–911)

## 2021-09-26 LAB — HEPATIC FUNCTION PANEL
ALT: 44 U/L — ABNORMAL HIGH (ref 0–35)
AST: 69 U/L — ABNORMAL HIGH (ref 0–37)
Albumin: 3.2 g/dL — ABNORMAL LOW (ref 3.5–5.2)
Alkaline Phosphatase: 105 U/L (ref 39–117)
Bilirubin, Direct: 0.4 mg/dL — ABNORMAL HIGH (ref 0.0–0.3)
Total Bilirubin: 1.7 mg/dL — ABNORMAL HIGH (ref 0.2–1.2)
Total Protein: 6.7 g/dL (ref 6.0–8.3)

## 2021-09-26 LAB — TSH: TSH: 1.09 u[IU]/mL (ref 0.35–5.50)

## 2021-09-26 LAB — URINALYSIS, ROUTINE W REFLEX MICROSCOPIC
Bilirubin Urine: NEGATIVE
Ketones, ur: NEGATIVE
Leukocytes,Ua: NEGATIVE
Nitrite: NEGATIVE
Specific Gravity, Urine: 1.025 (ref 1.000–1.030)
Urine Glucose: NEGATIVE
Urobilinogen, UA: 0.2 (ref 0.0–1.0)
pH: 6 (ref 5.0–8.0)

## 2021-09-26 LAB — VITAMIN D 25 HYDROXY (VIT D DEFICIENCY, FRACTURES): VITD: 37.71 ng/mL (ref 30.00–100.00)

## 2021-09-26 LAB — CORTISOL: Cortisol, Plasma: 10.8 ug/dL

## 2021-09-27 ENCOUNTER — Telehealth: Payer: Self-pay

## 2021-09-27 NOTE — Telephone Encounter (Signed)
Ok will address tomorrow

## 2021-09-27 NOTE — Telephone Encounter (Signed)
Pt is requesting a refill for: levothyroxine (SYNTHROID) 75 MCG tablet  Pharmacy: [facility] [place], [place]  Pt states that she isn't able to find her medication. Pt states the pharmacy said that she had just refilled it about a month ago.  Please advise  Pt is coming in tomorrow 5/26 for 6 mo fu and to discuss health concerns

## 2021-09-28 ENCOUNTER — Encounter: Payer: Self-pay | Admitting: Internal Medicine

## 2021-09-28 ENCOUNTER — Ambulatory Visit (INDEPENDENT_AMBULATORY_CARE_PROVIDER_SITE_OTHER): Payer: Medicare Other | Admitting: Internal Medicine

## 2021-09-28 VITALS — BP 122/78 | HR 72 | Temp 98.0°F | Ht 65.0 in | Wt 248.0 lb

## 2021-09-28 DIAGNOSIS — R7989 Other specified abnormal findings of blood chemistry: Secondary | ICD-10-CM

## 2021-09-28 DIAGNOSIS — R112 Nausea with vomiting, unspecified: Secondary | ICD-10-CM | POA: Diagnosis not present

## 2021-09-28 DIAGNOSIS — R739 Hyperglycemia, unspecified: Secondary | ICD-10-CM

## 2021-09-28 DIAGNOSIS — R634 Abnormal weight loss: Secondary | ICD-10-CM | POA: Diagnosis not present

## 2021-09-28 DIAGNOSIS — E559 Vitamin D deficiency, unspecified: Secondary | ICD-10-CM | POA: Diagnosis not present

## 2021-09-28 DIAGNOSIS — E538 Deficiency of other specified B group vitamins: Secondary | ICD-10-CM | POA: Diagnosis not present

## 2021-09-28 DIAGNOSIS — F411 Generalized anxiety disorder: Secondary | ICD-10-CM

## 2021-09-28 DIAGNOSIS — I10 Essential (primary) hypertension: Secondary | ICD-10-CM

## 2021-09-28 LAB — LIPID PANEL
Cholesterol: 102 mg/dL (ref 0–200)
HDL: 38.9 mg/dL — ABNORMAL LOW (ref >39.00)
LDL Cholesterol: 45 mg/dL (ref 0–99)
NonHDL: 63.27
Total CHOL/HDL Ratio: 3
Triglycerides: 92 mg/dL (ref 0.0–149.0)
VLDL: 18.4 mg/dL (ref 0.0–40.0)

## 2021-09-28 LAB — URINALYSIS, ROUTINE W REFLEX MICROSCOPIC
Bilirubin Urine: NEGATIVE
Ketones, ur: NEGATIVE
Nitrite: NEGATIVE
Specific Gravity, Urine: 1.02 (ref 1.000–1.030)
Urine Glucose: NEGATIVE
Urobilinogen, UA: 0.2 (ref 0.0–1.0)
pH: 6 (ref 5.0–8.0)

## 2021-09-28 LAB — HEPATIC FUNCTION PANEL
ALT: 43 U/L — ABNORMAL HIGH (ref 0–35)
AST: 68 U/L — ABNORMAL HIGH (ref 0–37)
Albumin: 3.3 g/dL — ABNORMAL LOW (ref 3.5–5.2)
Alkaline Phosphatase: 96 U/L (ref 39–117)
Bilirubin, Direct: 0.5 mg/dL — ABNORMAL HIGH (ref 0.0–0.3)
Total Bilirubin: 2.1 mg/dL — ABNORMAL HIGH (ref 0.2–1.2)
Total Protein: 7.4 g/dL (ref 6.0–8.3)

## 2021-09-28 LAB — CBC WITH DIFFERENTIAL/PLATELET
Basophils Absolute: 0 10*3/uL (ref 0.0–0.1)
Basophils Relative: 0.4 % (ref 0.0–3.0)
Eosinophils Absolute: 0.1 10*3/uL (ref 0.0–0.7)
Eosinophils Relative: 1.5 % (ref 0.0–5.0)
HCT: 35.1 % — ABNORMAL LOW (ref 36.0–46.0)
Hemoglobin: 11.5 g/dL — ABNORMAL LOW (ref 12.0–15.0)
Lymphocytes Relative: 6.1 % — ABNORMAL LOW (ref 12.0–46.0)
Lymphs Abs: 0.5 10*3/uL — ABNORMAL LOW (ref 0.7–4.0)
MCHC: 32.7 g/dL (ref 30.0–36.0)
MCV: 98.5 fl (ref 78.0–100.0)
Monocytes Absolute: 0.7 10*3/uL (ref 0.1–1.0)
Monocytes Relative: 8.7 % (ref 3.0–12.0)
Neutro Abs: 6.6 10*3/uL (ref 1.4–7.7)
Neutrophils Relative %: 83.3 % — ABNORMAL HIGH (ref 43.0–77.0)
Platelets: 167 10*3/uL (ref 150.0–400.0)
RBC: 3.57 Mil/uL — ABNORMAL LOW (ref 3.87–5.11)
RDW: 17.2 % — ABNORMAL HIGH (ref 11.5–15.5)
WBC: 7.9 10*3/uL (ref 4.0–10.5)

## 2021-09-28 LAB — PROTIME-INR
INR: 2.4 ratio — ABNORMAL HIGH (ref 0.8–1.0)
Prothrombin Time: 25.1 s — ABNORMAL HIGH (ref 9.6–13.1)

## 2021-09-28 LAB — BASIC METABOLIC PANEL
BUN: 23 mg/dL (ref 6–23)
CO2: 31 mEq/L (ref 19–32)
Calcium: 9.7 mg/dL (ref 8.4–10.5)
Chloride: 99 mEq/L (ref 96–112)
Creatinine, Ser: 1.09 mg/dL (ref 0.40–1.20)
GFR: 46.79 mL/min — ABNORMAL LOW (ref >60.00)
Glucose, Bld: 130 mg/dL — ABNORMAL HIGH (ref 70–99)
Potassium: 3.5 mEq/L (ref 3.5–5.1)
Sodium: 137 mEq/L (ref 135–145)

## 2021-09-28 LAB — VITAMIN D 25 HYDROXY (VIT D DEFICIENCY, FRACTURES): VITD: 37.58 ng/mL (ref 30.00–100.00)

## 2021-09-28 LAB — HEMOGLOBIN A1C: Hgb A1c MFr Bld: 5.8 % (ref 4.6–6.5)

## 2021-09-28 LAB — VITAMIN B12: Vitamin B-12: 398 pg/mL (ref 211–911)

## 2021-09-28 LAB — TSH: TSH: 1.24 u[IU]/mL (ref 0.35–5.50)

## 2021-09-28 MED ORDER — LEVOTHYROXINE SODIUM 75 MCG PO TABS: 75.0000 ug | ORAL_TABLET | Freq: Every day | ORAL | 1 refills | Status: DC

## 2021-09-28 NOTE — Telephone Encounter (Signed)
Levothyroxine has been sent [facility].Marland KitchenJohny [name]

## 2021-09-28 NOTE — Progress Notes (Unsigned)
Patient ID: [name], female   DOB: Aug 03, 1935, 86 y.o.   MRN: [id number]         Chief Complaint:: wellness exam and recent wt loss, abnormal lfts, ? Worsening ckd,  and memory changes, anxiety       HPI:  [name] is a 86 y.o. female here for wellness exam; declines covid booster, tdap, shingrix, o/w up to date                        Also states " I'm just not feeling well, I'm dying." But cant be more specific with symptoms, except also has mild worsening memory changes it seems in the past few months.  Denies worsening depressive symptoms, suicidal ideation, or panic; has ongoing anxiety  Has had also further wt loss over the past 3-6 mo with less appetite for unclear reasons.  Pt denies chest pain, increased sob or doe, wheezing, orthopnea, PND, increased LE swelling, palpitations, dizziness or syncope.   Pt denies polydipsia, polyuria, or new focal neuro s/s.    Pt denies fever, night sweats, or other constitutional symptoms     Wt Readings from Last 3 Encounters:  09/28/21 248 lb (112.5 kg)  09/25/21 242 lb (109.8 kg)  08/27/21 250 lb (113.4 kg)   BP Readings from Last 3 Encounters:  09/28/21 122/78  09/25/21 124/68  08/27/21 120/74   Immunization History  Administered Date(s) Administered   Fluad Quad(high Dose 65+) 03/30/2019, 01/21/2020, 04/24/2021   Influenza Split 04/06/2009, 04/05/2010, 01/10/2011, 02/10/2012, 04/23/2013   Influenza, High Dose Seasonal PF 03/20/2017, 01/19/2018   Influenza-Unspecified 05/08/2015, 01/23/2016   PFIZER(Purple Top)SARS-COV-2 Vaccination 06/11/2019, 07/06/2019, 02/28/2020   Pneumococcal Conjugate-13 06/01/2013, 03/20/2017   Pneumococcal Polysaccharide-23 03/16/2003, 01/23/2016   Td 12/08/2009   There are no preventive care reminders to display for this patient.     Past Medical History:  Diagnosis Date   Allergy    Anxiety    Arthritis    no cartlidge in knees   Asthma    Bradycardia    a. atenolol stopped due to this, HR 40s.    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  CAD s/p RCA stent 2000 with residual mild-mod disease of left system 2001.   Chronic atrial fibrillation ([facility])    COPD (chronic obstructive pulmonary disease) ([place])    Essential hypertension 03/24/2015   Gout    post operative   Hx of radiation therapy 04/02/11 -05/20/11   left breast   Hyperlipidemia    Hypothyroidism 03/24/2015   Incontinence of urine    Malignant neoplasm of upper-outer quadrant of female breast ([place]) 12/26/2010   Osteoporosis 03/24/2015   Peripheral neuropathy 03/24/2015   Plantar fasciitis    Sleep apnea    Past Surgical History:  Procedure Laterality Date   BREAST LUMPECTOMY W/ NEEDLE LOCALIZATION  01/29/2011   Left with [facility] Dr Margot Chimes   CHOLECYSTECTOMY  1955   CORONARY ANGIOPLASTY WITH STENT PLACEMENT  2000   DILATION AND CURETTAGE OF UTERUS  1976   and 1996   [place]   tendons between thumb and forefinger   SKIN BIOPSY     1994, 2006, 2008, 2010, 2011, 2011, 2012-  pre-cancerous   TONSILLECTOMY  1955    reports that she quit smoking about 58 years ago. Her smoking use included cigarettes. She has a 4.00 pack-year smoking history.  She has never used smokeless tobacco. She reports that she does not drink alcohol and does not use drugs. family history includes Asthma in her sister; Cancer in her father; Heart attack in her brother and father; Heart disease in her brother and father. Allergies  Allergen Reactions   Crestor [Rosuvastatin Calcium]     Severe liver function problems   Hydrocodone-Acetaminophen Anxiety and Itching   Lidocaine Hcl Other (See Comments)    Novacaine:  Becomes very shaky   Lipitor [Atorvastatin Calcium] Other (See Comments)    Elevated liver function    Procaine Other (See Comments)    shaking   Rosuvastatin Other (See Comments)    Severe liver function problems   Theophylline Other (See Comments)    Becomes very  shaky skaking skaking   Vicodin [Hydrocodone-Acetaminophen] Itching    Itching all over   Codeine Nausea Only, Anxiety and Other (See Comments)    Also complained of dizziness.  Has same problems with oxycodone and hydrocodone Visual Disturbance, Balance Difficulty, Dizzy "Room Spinning; "Swimming" Balance, vision, nausea, dizzy, "swimming", "room spinning" Balance, vision, nausea, dizzy, "swimming", "room spinning"   Ephedrine Other (See Comments)    shaking   Other Other (See Comments)    Quinine causes nausea, dizzy   Oxycodone Other (See Comments)    Balance, vision, nausea, dizzy, "swimming", "room spinning"   Oxycodone-Acetaminophen Nausea Only, Anxiety and Other (See Comments)    Visual Disturbance, Balance Difficulty, Dizzy "Room Spinning; "Swimming"   Propoxyphene Anxiety, Nausea Only and Other (See Comments)    Visual Disturbance, Balance Difficulty, Dizzy "Room Spinning; "Swimming" Balance, vision, nausea, dizzy, "swimming", "room spinning" Balance, vision, nausea, dizzy, "swimming, "room spinning"   Quinine Derivatives Nausea Only    dizzy   Sulfa Antibiotics Nausea Only    Also complained of dizziness   Epinephrine Other (See Comments)    Patient becomes "shaky" shaking shaking   Penicillins Rash    Pt reported all over body rash in 1958 Has patient had a PCN reaction causing immediate rash, facial/tongue/throat swelling, SOB or lightheadedness with hypotension:Yes Has patient had a PCN reaction causing severe rash involving mucus membranes or skin necrosis: No Has patient had a PCN reaction that required hospitalization: No Has patient had a PCN reaction occurring within the last 10 years:No     Quinine Other (See Comments)   Theophyllines Other (See Comments)    Patient becomes "shaky"   Current Outpatient Medications on File Prior to Visit  Medication Sig Dispense Refill   albuterol (PROVENTIL) (2.5 MG/3ML) 0.083% nebulizer solution Take 3 mLs (2.5 mg  total) by nebulization every 6 (six) hours as needed for wheezing or shortness of breath. 75 mL 12   ALLERGY RELIEF 180 MG tablet TAKE 1 TABLET BY MOUTH EVERY DAY 90 tablet 1   allopurinol (ZYLOPRIM) 300 MG tablet Take 300 mg by mouth daily.     ALPRAZolam (XANAX) 0.5 MG tablet TAKE 1 TO 2 TABLETS BY MOUTH TWICE DAILY AS NEEDED FOR ANXIETY OR SLEEP 60 tablet 2   Ascorbic Acid (VITAMIN C) 1000 MG tablet Take 1,000 mg by mouth in the morning.     atorvastatin (LIPITOR) 20 MG tablet Take 1 tablet (20 mg total) by mouth daily. 90 tablet 3   azelastine (ASTELIN) 0.1 % nasal spray Place 2 sprays into both nostrils daily. Use in each nostril as directed (Patient taking differently: Place 2 sprays into both nostrils in the morning.) 30 mL 5   B Complex Vitamins (VITAMIN B  COMPLEX PO) Take 1 tablet by mouth daily.     Biotin 5000 MCG CAPS Take 5,000 mcg by mouth daily.      cholecalciferol (VITAMIN D) 1000 UNITS tablet Take 1,000 Units by mouth daily.     Choline Dihydrogen Citrate (CHOLINE CITRATE PO) Take 1 capsule by mouth in the morning.     co-enzyme Q-10 50 MG capsule Take 100 mg by mouth daily.     colchicine 0.6 MG tablet Take 0.6 mg by mouth daily as needed (flare  up).      Cyanocobalamin (VITAMIN B-12 PO) Take 1 tablet by mouth daily.     cycloSPORINE (RESTASIS) 0.05 % ophthalmic emulsion Apply 1 drop to eye 2 (two) times daily as needed (allergies).     dextromethorphan-guaiFENesin (MUCINEX DM) 30-600 MG 12hr tablet Take 1 tablet by mouth 2 (two) times daily.     EPINEPHrine 0.3 mg/0.3 mL IJ SOAJ injection Inject 0.3 mg into the muscle as needed for anaphylaxis.     escitalopram (LEXAPRO) 10 MG tablet TAKE 1 TABLET(10 MG) BY MOUTH DAILY 90 tablet 3   ezetimibe (ZETIA) 10 MG tablet Take 10 mg by mouth 2 (two) times a week. Take on Tues & Fri     fluticasone (FLONASE) 50 MCG/ACT nasal spray Place 1 spray into both nostrils every morning.     fluticasone-salmeterol (ADVAIR) 500-50 MCG/ACT AEPB  Inhale 1 puff into the lungs in the morning and at bedtime.     gabapentin (NEURONTIN) 300 MG capsule Take 1 capsule (300 mg total) by mouth 3 (three) times daily. (Patient taking differently: Take 300 mg by mouth at bedtime.) 270 capsule 1   Homeopathic Products (ARNICARE) GEL Apply 1 application topically daily as needed (soreness).     hydrochlorothiazide (HYDRODIURIL) 25 MG tablet TAKE 2 TABLETS(50 MG) BY MOUTH DAILY 180 tablet 1   levalbuterol (XOPENEX HFA) 45 MCG/ACT inhaler Inhale 2 puffs into the lungs every 6 (six) hours as needed for wheezing or shortness of breath. 3 Inhaler 3   levocetirizine (XYZAL) 5 MG tablet Take 1 tablet (5 mg total) by mouth every evening. 30 tablet 11   metoprolol succinate (TOPROL-XL) 25 MG 24 hr tablet TAKE 1/2 TABLET(12.5 MG) BY MOUTH DAILY 45 tablet 3   montelukast (SINGULAIR) 10 MG tablet Take 1 tablet (10 mg total) by mouth at bedtime. 90 tablet 1   nitrofurantoin (MACRODANTIN) 50 MG capsule Take 50 mg by mouth 3 (three) times a week. Take three days a week (MWF)     Omega-3 Fatty Acids (FISH OIL) 1000 MG CAPS Take 1 capsule by mouth daily.     ondansetron (ZOFRAN) 4 MG tablet Take 1 tablet (4 mg total) by mouth every 8 (eight) hours as needed for nausea or vomiting. 40 tablet 3   potassium chloride SA (KLOR-CON M) 20 MEQ tablet TAKE 1 TABLET(20 MEQ) BY MOUTH DAILY 90 tablet 3   Pyridoxine HCl (VITAMIN B-6 PO) Take 1 tablet by mouth daily.     [place] 420 MG/3.5ML SOCT INJECT 1 UNIT INTO THE SKIN EVERY 30 DAYS 3.5 mL 11   TART CHERRY PO Take 1 capsule by mouth in the morning and at bedtime.     VITAMIN E PO Take 1 tablet by mouth daily.     XARELTO 20 MG TABS tablet TAKE 1 TABLET(20 MG) BY MOUTH DAILY WITH SUPPER 90 tablet 1   Zinc 100 MG TABS Take 1 tablet by mouth in the morning.  levothyroxine (SYNTHROID) 75 MCG tablet Take 1 tablet (75 mcg total) by mouth daily before breakfast. 90 tablet 1   No current facility-administered  medications on file prior to visit.        ROS:  All others reviewed and negative.  Objective        PE:  BP 122/78 (BP Location: Right Arm, Patient Position: Sitting, Cuff Size: Large)   Pulse 72   Temp 98 F (36.7 C) (Oral)   Ht $R'5\' 5"'ZT$  (1.651 m)   Wt 248 lb (112.5 kg)   SpO2 95%   BMI 41.27 kg/m                 Constitutional: Pt appears in NAD               HENT: Head: NCAT.                Right Ear: External ear normal.                 Left Ear: External ear normal.                Eyes: . Pupils are equal, round, and reactive to light. Conjunctivae and EOM are normal               Nose: without d/c or deformity               Neck: Neck supple. Gross normal ROM               Cardiovascular: Normal rate and regular rhythm.                 Pulmonary/Chest: Effort normal and breath sounds without rales or wheezing.                Abd:  Soft, NT, ND, + BS, no organomegaly               Neurological: Pt is alert. At baseline orientation, motor grossly intact               Skin: Skin is warm. No rashes, no other new lesions, LE edema - none               Psychiatric: Pt behavior is normal without agitation , marked nervous  Micro: none  Cardiac tracings I have personally interpreted today:  none  Pertinent Radiological findings (summarize): none   Lab Results  Component Value Date   WBC 7.9 09/28/2021   HGB 11.5 (L) 09/28/2021   HCT 35.1 (L) 09/28/2021   PLT 167.0 09/28/2021   GLUCOSE 130 (H) 09/28/2021   CHOL 102 09/28/2021   TRIG 92.0 09/28/2021   HDL 38.90 (L) 09/28/2021   LDLDIRECT 107.0 09/17/2016   LDLCALC 45 09/28/2021   ALT 43 (H) 09/28/2021   AST 68 (H) 09/28/2021   NA 137 09/28/2021   K 3.5 09/28/2021   CL 99 09/28/2021   CREATININE 1.09 09/28/2021   BUN 23 09/28/2021   CO2 31 09/28/2021   TSH 1.24 09/28/2021   INR 2.4 (H) 09/28/2021   HGBA1C 5.8 09/28/2021   Assessment/Plan:  TANEA MOGA is a 86 y.o. 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  Hypertension BP Readings from Last 3 Encounters:  09/28/21 122/78  09/25/21 124/68  08/27/21 120/74   Stable, pt to continue medical treatment - hct  Anxiety state Mild worsening it seems, declines change in tx for now  Weight loss Etiology unclear - ? Psychiatric after husband recent sudden death vs physical, for expectant management  Abnormal LFTs With recent wt loss, mild elevated LFTs - for CT abd pelvis, acute hep panel, refer GI  Followup: Return in about 3 months (around 12/29/2021).  Cathlean Cower, MD 09/30/2021 8:56 PM [facility]

## 2021-09-28 NOTE — Patient Instructions (Signed)
Please continue all other medications as before, and refills have been done if requested.  Please have the pharmacy call with any other refills you may need.  Please continue your efforts at being more active, low cholesterol diet, and weight control.  You are otherwise up to date with prevention measures today.  Please keep your appointments with your specialists as you may have planned  You will be contacted regarding the referral for: CT scan for the abdominal, and Gastroenterology  Please go to the LAB at the blood drawing area for the tests to be done  You will be contacted by phone if any changes need to be made immediately.  Otherwise, you will receive a letter about your results with an explanation, but please check with MyChart first.  Please remember to sign up for MyChart if you have not done so, as this will be important to you in the future with finding out test results, communicating by private email, and scheduling acute appointments online when needed.  Please make an Appointment to return in 3 months, or sooner if needed

## 2021-09-28 NOTE — Telephone Encounter (Signed)
Pt is calling stating she forgot to mention needing the Rx on levothyroxine (SYNTHROID) 75 MCG tablet   Pharmacy: [facility] [place], [place] AT [address]   Please advise

## 2021-09-30 ENCOUNTER — Encounter: Payer: Self-pay | Admitting: Internal Medicine

## 2021-09-30 NOTE — Assessment & Plan Note (Signed)
Mild worsening it seems, declines change in tx for now

## 2021-09-30 NOTE — Assessment & Plan Note (Signed)
BP Readings from Last 3 Encounters:  09/28/21 122/78  09/25/21 124/68  08/27/21 120/74   Stable, pt to continue medical treatment - hct

## 2021-09-30 NOTE — Assessment & Plan Note (Signed)
Etiology unclear - ? Psychiatric after husband recent sudden death vs physical, for expectant management

## 2021-09-30 NOTE — Assessment & Plan Note (Signed)
With recent wt loss, mild elevated LFTs - for CT abd pelvis, acute hep panel, refer GI

## 2021-10-02 LAB — HEPATITIS PANEL, ACUTE
Hep A IgM: NONREACTIVE
Hep B C IgM: NONREACTIVE
Hepatitis B Surface Ag: NONREACTIVE
Hepatitis C Ab: NONREACTIVE
SIGNAL TO CUT-OFF: 0.12 (ref <1.00)

## 2021-10-03 ENCOUNTER — Ambulatory Visit: Payer: Medicare Other | Admitting: Nurse Practitioner

## 2021-10-03 ENCOUNTER — Other Ambulatory Visit: Payer: Self-pay

## 2021-10-03 ENCOUNTER — Encounter: Payer: Self-pay | Admitting: Nurse Practitioner

## 2021-10-03 VITALS — BP 114/60 | HR 85 | Temp 98.3°F | Ht 66.0 in | Wt 252.8 lb

## 2021-10-03 DIAGNOSIS — R053 Chronic cough: Secondary | ICD-10-CM | POA: Diagnosis not present

## 2021-10-03 DIAGNOSIS — J9601 Acute respiratory failure with hypoxia: Secondary | ICD-10-CM | POA: Diagnosis not present

## 2021-10-03 DIAGNOSIS — Z9989 Dependence on other enabling machines and devices: Secondary | ICD-10-CM | POA: Diagnosis not present

## 2021-10-03 DIAGNOSIS — J849 Interstitial pulmonary disease, unspecified: Secondary | ICD-10-CM

## 2021-10-03 DIAGNOSIS — G4733 Obstructive sleep apnea (adult) (pediatric): Secondary | ICD-10-CM

## 2021-10-03 DIAGNOSIS — J45909 Unspecified asthma, uncomplicated: Secondary | ICD-10-CM

## 2021-10-03 NOTE — Progress Notes (Unsigned)
_0  ID: [name], female    DOB: 1935/10/11, 86 y.o.   MRN: [id number]  Chief Complaint  Patient presents with   Follow-up    Follow up. Patient says she is getting better.     Referring provider: Biagio Borg, MD  HPI: 86 year old female, former remote smoker followed for asthma, mixed obstructive and restrictive airway disease and allergic rhinitis.  She is a patient Dr. Agustina Caroli last seen in office on 07/25/2021.  Past medical history significant for OSA on CPAP (followed by Dr. Elenore Rota), CAD, CHF, hypertension, sinus node dysfunction, A-fib on Xarelto, dysphagia, hypothyroid, peripheral neuropathy, osteoarthritis, stage Ia left breast cancer 2012, HLD, obesity, gout, prediabetes.  TEST/EVENTS:  06/13/2021 echocardiogram: EF 60 to 65%.  LVH.  Unable to determine diastolic function.  Moderately elevated PASP.   06/15/2021 CTA chest: Satisfactory opacification of the pulmonary arteries with no evidence of PE.  There is enlargement of the pulmonary artery, suggesting pulmonary hypertension.  Atherosclerosis is present.  Multifocal areas of airspace consolidation throughout both lungs, mostly groundglass appearance, concerning for multifocal pneumonia.  Stable left renal cyst and 10 mm left renal stone.  Small hiatal hernia present. 09/25/2021 CXR 2 view: lungs are hyperinflated. There is moderate to marked severity b/l areas of scarring and fibrosis, again noted and most prominent in the b/l upper lobes, mid left lung and b/l lung bases.   06/26/2021: OV with Cobb NP for hospital follow-up after being treated for multifocal pneumonia.  Reported having new fevers up to Tmax of 102.  Shortness of breath was worse.  Also coughing up more purulent sputum.  Given worsening of symptoms, patient was taken to CXR which showed persistent airspace opacities.  When she returned to the room from chest x-ray she was in mild respiratory distress, requiring supplemental O2 due to desaturations to the 70s.   EMS was contacted for transport to ED as patient had failed outpatient therapy.  07/25/2021: OV with Dr. Lamonte Sakai.  Discharged on 07/01/2021. Prior to this visit, she was treated with another course of prednisone after contacting the office on 07/16/2021 for suspected upper airway irritation and postinfectious cough. Reported starting to feel better.  Concerned for coming off prednisone given allergy season.  Discussed potential side effects and concern for recurrent pneumonia with continued prednisone use.  Continued on prednisone 20 mg for 5 days then decrease to 10 mg daily with plans to taper off further at follow-up.  Advised to continue supplemental oxygen with plans for walking oximetry at next visit.  Continue allergy treatment regimen.  Suspected that her cough was primarily upper airway irritation.  Plans to repeat chest x-ray in 1 month  08/27/2021: OV with Cobb NP for follow-up.  She reports feeling significantly better.  Looks much better than she did the last time I saw her. Significant improvement since being discharged and last seen in office.  Cough has resolved and shortness of breath has significantly improved.  Oxygen requirement has also improved-able to maintain saturations on room air in the 90s. Walking oximetry today with saturations in the 90s on room air.  Advised to monitor at home and notify if <88-90%.  CXR today showed improved patchy interstitial opacities throughout both lungs, likely scarring given clinical improvement. Continued on Trelegy and PRN albuterol. Singulair and OTC antihistamine for trigger prevention and flonase for postnasal drainage control. Good compliance on CPAP since discharge.  09/25/2021: OV with Dr. Lamonte Sakai. Doing well since discharge up until the last few weeks with increased  dyspnea and hypoxemia, requiring O2 again. Feeling similar to when she was readmitted last time with fatigue, weakness and intermittent confusion. No significant change in cough - dry.  Questioned for possible adrenal insufficiency as she has been off prednisone after being on it for most of February and March. Findings not consistent with CAP; CXR showed chronic b/l areas of scarring and fibrosis, unable to r/o superimposed infiltrate entirely. Labs unremarkable with nl cortisol and stable CBC.   10/03/2021: Today - follow up Patient presents today with daughter for follow up. She reports feeling much better than she did last week. She did start to have a more productive cough with white/gray sputum production but she feels like this has actually improved her chest congestion. She also doesn't feel as short of breath as she did and her fatigue symptoms are much better. She was having some nausea last week as well, which she saw her PCP for and found to have elevated liver enzymes. Hepatitis panel was negative; given her symptoms and weight loss, he recommended referral to GI for further workup. She does feel like her appetite has improved and she is not nearly as nauseated as she was last week. She denies any hemoptysis, ongoing weight loss, lower extremity edema, fevers, night sweats, hematochezia or vomiting. She is currently on 3 lpm supplemental oxygen and her oxygen levels have been in the high 90's. She does continue to wear CPAP nightly, which is managed by Dr. Isidoro Donning. She did recently get a new machine and sleeps well with it.   Allergies  Allergen Reactions   Crestor [Rosuvastatin Calcium]     Severe liver function problems   Hydrocodone-Acetaminophen Anxiety and Itching   Lidocaine Hcl Other (See Comments)    Novacaine:  Becomes very shaky   Lipitor [Atorvastatin Calcium] Other (See Comments)    Elevated liver function    Procaine Other (See Comments)    shaking   Rosuvastatin Other (See Comments)    Severe liver function problems   Theophylline Other (See Comments)    Becomes very shaky skaking skaking   Vicodin [Hydrocodone-Acetaminophen] Itching    Itching all  over   Codeine Nausea Only, Anxiety and Other (See Comments)    Also complained of dizziness.  Has same problems with oxycodone and hydrocodone Visual Disturbance, Balance Difficulty, Dizzy "Room Spinning; "Swimming" Balance, vision, nausea, dizzy, "swimming", "room spinning" Balance, vision, nausea, dizzy, "swimming", "room spinning"   Ephedrine Other (See Comments)    shaking   Other Other (See Comments)    Quinine causes nausea, dizzy   Oxycodone Other (See Comments)    Balance, vision, nausea, dizzy, "swimming", "room spinning"   Oxycodone-Acetaminophen Nausea Only, Anxiety and Other (See Comments)    Visual Disturbance, Balance Difficulty, Dizzy "Room Spinning; "Swimming"   Propoxyphene Anxiety, Nausea Only and Other (See Comments)    Visual Disturbance, Balance Difficulty, Dizzy "Room Spinning; "Swimming" Balance, vision, nausea, dizzy, "swimming", "room spinning" Balance, vision, nausea, dizzy, "swimming, "room spinning"   Quinine Derivatives Nausea Only    dizzy   Sulfa Antibiotics Nausea Only    Also complained of dizziness   Epinephrine Other (See Comments)    Patient becomes "shaky" shaking shaking   Penicillins Rash    Pt reported all over body rash in 1958 Has patient had a PCN reaction causing immediate rash, facial/tongue/throat swelling, SOB or lightheadedness with hypotension:Yes Has patient had a PCN reaction causing severe rash involving mucus membranes or skin necrosis: No Has patient had a PCN reaction  that required hospitalization: No Has patient had a PCN reaction occurring within the last 10 years:No     Quinine Other (See Comments)   Theophyllines Other (See Comments)    Patient becomes "shaky"    Immunization History  Administered Date(s) Administered   Fluad Quad(high Dose 65+) 03/30/2019, 01/21/2020, 04/24/2021   Influenza Split 04/06/2009, 04/05/2010, 01/10/2011, 02/10/2012, 04/23/2013   Influenza, High Dose Seasonal PF 03/20/2017, 01/19/2018    Influenza-Unspecified 05/08/2015, 01/23/2016   PFIZER(Purple Top)SARS-COV-2 Vaccination 06/11/2019, 07/06/2019, 02/28/2020   Pneumococcal Conjugate-13 06/01/2013, 03/20/2017   Pneumococcal Polysaccharide-23 03/16/2003, 01/23/2016   Td 12/08/2009    Past Medical History:  Diagnosis Date   Allergy    Anxiety    Arthritis    no cartlidge in knees   Asthma    Bradycardia    a. atenolol stopped due to this, HR 40s.   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  CAD s/p RCA stent 2000 with residual mild-mod disease of left system 2001.   Chronic atrial fibrillation (HCC)    COPD (chronic obstructive pulmonary disease) ([place])    Essential hypertension 03/24/2015   Gout    post operative   Hx of radiation therapy 04/02/11 -05/20/11   left breast   Hyperlipidemia    Hypothyroidism 03/24/2015   Incontinence of urine    Malignant neoplasm of upper-outer quadrant of female breast ([place]) 12/26/2010   Osteoporosis 03/24/2015   Peripheral neuropathy 03/24/2015   Plantar fasciitis    Sleep apnea     Tobacco History: Social History   Tobacco Use  Smoking Status Former   Packs/day: 1.00   Years: 4.00   Pack years: 4.00   Types: Cigarettes   Quit date: 12/05/1962   Years since quitting: 58.8  Smokeless Tobacco Never   Counseling given: Not Answered   Outpatient Medications Prior to Visit  Medication Sig Dispense Refill   albuterol (PROVENTIL) (2.5 MG/3ML) 0.083% nebulizer solution Take 3 mLs (2.5 mg total) by nebulization every 6 (six) hours as needed for wheezing or shortness of breath. 75 mL 12   ALLERGY RELIEF 180 MG tablet TAKE 1 TABLET BY MOUTH EVERY DAY 90 tablet 1   allopurinol (ZYLOPRIM) 300 MG tablet Take 300 mg by mouth daily.     ALPRAZolam (XANAX) 0.5 MG tablet TAKE 1 TO 2 TABLETS BY MOUTH TWICE DAILY AS NEEDED FOR ANXIETY OR SLEEP 60 tablet 2   Ascorbic Acid (VITAMIN C) 1000 MG tablet Take 1,000 mg by mouth in the morning.      atorvastatin (LIPITOR) 20 MG tablet Take 1 tablet (20 mg total) by mouth daily. 90 tablet 3   azelastine (ASTELIN) 0.1 % nasal spray Place 2 sprays into both nostrils daily. Use in each nostril as directed (Patient taking differently: Place 2 sprays into both nostrils in the morning.) 30 mL 5   B Complex Vitamins (VITAMIN B COMPLEX PO) Take 1 tablet by mouth daily.     Biotin 5000 MCG CAPS Take 5,000 mcg by mouth daily.      cholecalciferol (VITAMIN D) 1000 UNITS tablet Take 1,000 Units by mouth daily.     Choline Dihydrogen Citrate (CHOLINE CITRATE PO) Take 1 capsule by mouth in the morning.     co-enzyme Q-10 50 MG capsule Take 100 mg by mouth daily.     colchicine 0.6 MG tablet Take 0.6 mg by mouth daily as needed (flare  up).  Cyanocobalamin (VITAMIN B-12 PO) Take 1 tablet by mouth daily.     cycloSPORINE (RESTASIS) 0.05 % ophthalmic emulsion Apply 1 drop to eye 2 (two) times daily as needed (allergies).     dextromethorphan-guaiFENesin (MUCINEX DM) 30-600 MG 12hr tablet Take 1 tablet by mouth 2 (two) times daily.     EPINEPHrine 0.3 mg/0.3 mL IJ SOAJ injection Inject 0.3 mg into the muscle as needed for anaphylaxis.     escitalopram (LEXAPRO) 10 MG tablet TAKE 1 TABLET(10 MG) BY MOUTH DAILY 90 tablet 3   ezetimibe (ZETIA) 10 MG tablet Take 10 mg by mouth 2 (two) times a week. Take on Tues & Fri     fluticasone (FLONASE) 50 MCG/ACT nasal spray Place 1 spray into both nostrils every morning.     fluticasone-salmeterol (ADVAIR) 500-50 MCG/ACT AEPB Inhale 1 puff into the lungs in the morning and at bedtime.     gabapentin (NEURONTIN) 300 MG capsule Take 1 capsule (300 mg total) by mouth 3 (three) times daily. (Patient taking differently: Take 300 mg by mouth at bedtime.) 270 capsule 1   Homeopathic Products (ARNICARE) GEL Apply 1 application topically daily as needed (soreness).     hydrochlorothiazide (HYDRODIURIL) 25 MG tablet TAKE 2 TABLETS(50 MG) BY MOUTH DAILY 180 tablet 1   levalbuterol  (XOPENEX HFA) 45 MCG/ACT inhaler Inhale 2 puffs into the lungs every 6 (six) hours as needed for wheezing or shortness of breath. 3 Inhaler 3   levocetirizine (XYZAL) 5 MG tablet Take 1 tablet (5 mg total) by mouth every evening. 30 tablet 11   levothyroxine (SYNTHROID) 75 MCG tablet Take 1 tablet (75 mcg total) by mouth daily before breakfast. 90 tablet 1   metoprolol succinate (TOPROL-XL) 25 MG 24 hr tablet TAKE 1/2 TABLET(12.5 MG) BY MOUTH DAILY 45 tablet 3   montelukast (SINGULAIR) 10 MG tablet Take 1 tablet (10 mg total) by mouth at bedtime. 90 tablet 1   nitrofurantoin (MACRODANTIN) 50 MG capsule Take 50 mg by mouth 3 (three) times a week. Take three days a week (MWF)     Omega-3 Fatty Acids (FISH OIL) 1000 MG CAPS Take 1 capsule by mouth daily.     ondansetron (ZOFRAN) 4 MG tablet Take 1 tablet (4 mg total) by mouth every 8 (eight) hours as needed for nausea or vomiting. 40 tablet 3   potassium chloride SA (KLOR-CON M) 20 MEQ tablet TAKE 1 TABLET(20 MEQ) BY MOUTH DAILY 90 tablet 3   Pyridoxine HCl (VITAMIN B-6 PO) Take 1 tablet by mouth daily.     [place] 420 MG/3.5ML SOCT INJECT 1 UNIT INTO THE SKIN EVERY 30 DAYS 3.5 mL 11   TART CHERRY PO Take 1 capsule by mouth in the morning and at bedtime.     VITAMIN E PO Take 1 tablet by mouth daily.     XARELTO 20 MG TABS tablet TAKE 1 TABLET(20 MG) BY MOUTH DAILY WITH SUPPER 90 tablet 1   Zinc 100 MG TABS Take 1 tablet by mouth in the morning.     No facility-administered medications prior to visit.     Review of Systems:   Constitutional: No night sweats, fevers, chills, fatigue, or lassitude. +weight loss (over six months; stabilized since discharge)  HEENT: No headaches, difficulty swallowing, tooth/dental problems, or sore throat. No sneezing, itching, ear ache, nasal congestion, or post nasal drip CV:  No chest pain, orthopnea, PND, swelling in lower extremities, anasarca, dizziness, palpitations, syncope Resp:  +shortness of breath with  exertion (significant improvement); productive cough; improved chest congestion. No excess mucus or change in color of mucus. No productive or non-productive. No hemoptysis. No wheezing.  No chest wall deformity GI:  No heartburn, indigestion, abdominal pain, nausea, vomiting, diarrhea, change in bowel habits, loss of appetite, bloody stools.  Skin: No rash, lesions, ulcerations MSK:  No joint pain or swelling.  No decreased range of motion.  No back pain. Neuro: No dizziness or lightheadedness.  Psych: No depression or anxiety. Mood stable.     Physical Exam:  BP 114/60 (BP Location: Right Arm, Patient Position: Sitting, Cuff Size: Normal)   Pulse 85   Temp 98.3 F (36.8 C) (Oral)   Ht 5' 6" (1.676 m)   Wt 252 lb 12.8 oz (114.7 kg)   SpO2 97%   BMI 40.80 kg/m   GEN: Pleasant, interactive, chronically-ill appearing; elderly; obese; in no acute distress. HEENT:  Normocephalic and atraumatic. PERRLA. Sclera white. Nasal turbinates pink, moist and patent bilaterally. No rhinorrhea present. Oropharynx pink and moist, without exudate or edema. No lesions, ulcerations, or postnasal drip.  NECK:  Supple w/ fair ROM. No JVD present. Normal carotid impulses w/o bruits. Thyroid symmetrical with no goiter or nodules palpated. No lymphadenopathy.   CV: RRR, no m/r/g, no peripheral edema. Pulses intact, +2 bilaterally. No cyanosis, pallor or clubbing. PULMONARY:  Unlabored, regular breathing. Bibasilar crackles otherwise clear bilaterally A&P w/o wheezes/rales/rhonchi. Pseudowheeze - resolves when distracted. No accessory muscle use. No dullness to percussion. GI: BS present and normoactive. Soft, non-tender to palpation.  Neuro: A/Ox3. No focal deficits noted.   Skin: Warm, no lesions or rashe Psych: Normal affect and behavior. Judgement and thought content appropriate.     Lab Results:  CBC    Component Value Date/Time   WBC 7.9 09/28/2021 0939   RBC 3.57 (L)  09/28/2021 0939   HGB 11.5 (L) 09/28/2021 0939   HGB 13.6 06/27/2016 0839   HCT 35.1 (L) 09/28/2021 0939   HCT 40.6 06/27/2016 0839   PLT 167.0 09/28/2021 0939   PLT 146 06/27/2016 0839   MCV 98.5 09/28/2021 0939   MCV 98.1 06/27/2016 0839   MCH 31.7 06/30/2021 0732   MCHC 32.7 09/28/2021 0939   RDW 17.2 (H) 09/28/2021 0939   RDW 15.0 (H) 06/27/2016 0839   LYMPHSABS 0.5 (L) 09/28/2021 0939   LYMPHSABS 1.5 06/27/2016 0839   MONOABS 0.7 09/28/2021 0939   MONOABS 0.8 06/27/2016 0839   EOSABS 0.1 09/28/2021 0939   EOSABS 0.3 06/27/2016 0839   BASOSABS 0.0 09/28/2021 0939   BASOSABS 0.1 06/27/2016 0839    BMET    Component Value Date/Time   NA 137 09/28/2021 0939   NA 147 (H) 06/06/2021 1607   NA 143 06/27/2016 0839   K 3.5 09/28/2021 0939   K 4.0 06/27/2016 0839   CL 99 09/28/2021 0939   CL 105 07/15/2012 1409   CO2 31 09/28/2021 0939   CO2 28 06/27/2016 0839   GLUCOSE 130 (H) 09/28/2021 0939   GLUCOSE 109 06/27/2016 0839   GLUCOSE 117 (H) 07/15/2012 1409   BUN 23 09/28/2021 0939   BUN 14 06/06/2021 1607   BUN 17.3 06/27/2016 0839   CREATININE 1.09 09/28/2021 0939   CREATININE 0.83 11/24/2019 0958   CREATININE 0.8 06/27/2016 0839   CALCIUM 9.7 09/28/2021 0939   CALCIUM 9.7 06/27/2016 0839   GFRNONAA >60 06/30/2021 0508   GFRNONAA 66 11/24/2019 0958   GFRAA 66 12/28/2019 1549   GFRAA 76 11/24/2019 0958  BNP    Component Value Date/Time   BNP 103.8 (H) 06/26/2021 1327   BNP 72 11/15/2019 1304     Imaging:  DG Chest 2 View  Result Date: 09/26/2021 CLINICAL DATA:  Wheezing, coughing and shortness of breath. EXAM: CHEST - 2 VIEW COMPARISON:  August 27, 2021 FINDINGS: The heart size and mediastinal contours are within normal limits. The lungs are hyperinflated. Moderate to marked severity bilateral areas of scarring and fibrosis are again noted. These areas are most prominent within the bilateral upper lobes, mid left lung and bilateral lung bases. There is no  evidence of a pleural effusion or pneumothorax. Multiple surgical clips are seen within the right upper quadrant. Multilevel degenerative changes seen throughout the thoracic spine. IMPRESSION: 1. Chronic bilateral areas of scarring and fibrosis. A mild superimposed component of acute infiltrate cannot completely be excluded. Correlation with follow-up imaging is recommended to determine stability. Electronically Signed   By: Virgina Norfolk M.D.   On: 09/26/2021 22:30         Latest Ref Rng & Units 02/06/2017   10:49 AM  PFT Results  FVC-Pre L 2.19    FVC-Predicted Pre % 74    FVC-Post L 2.15    FVC-Predicted Post % 73    Pre FEV1/FVC % % 76    Post FEV1/FCV % % 81    FEV1-Pre L 1.67    FEV1-Predicted Pre % 75    FEV1-Post L 1.75    DLCO uncorrected ml/min/mmHg 20.72    DLCO UNC% % 75    DLCO corrected ml/min/mmHg 19.57    DLCO COR %Predicted % 70    DLVA Predicted % 98    TLC L 4.55    TLC % Predicted % 83    RV % Predicted % 85      No results found for: NITRICOXIDE      Assessment & Plan:   ILD (interstitial lung disease) ([place]) Question whether her progressive DOE, weight loss, anorexia and increased oxygen requirements are related to underlying ILD or possible PAH. She does want to further investigate to find out the root cause of her problems, if possible. HRCT ordered for further evaluation given findings of scarring and fibrosis on recent CXR. She did have moderately elevated PASP on her echo from February - discussed that we may need to consider right heart cath for further workup. We will obtain repeat PFTs as she did have some restriction on her 2018 ones and need to determine if there has been progression of disease.  Acute respiratory failure with hypoxia (HCC) Clinically improved since last week. Cough is slightly more productive however, WBC is nl and stable, and she feels significantly better overall. Oxygenation status has also improved. O2 was 98% on 3 lpm.  She was placed on room air and maintained in the 90's. Walking oximetry performed - she did drop to 87%, requiring 2 lpm to maintain >88% with mobility. Recovered quickly upon rest. Advised close monitoring of symptoms.   Patient Instructions  Continue Trelegy 1 puff daily. Brush tongue and rinse mouth afterwards  Continue Albuterol inhaler 2 puffs or 3 mL neb every 6 hours as needed for shortness of breath or wheezing. Notify if symptoms persist despite rescue inhaler/neb use. Continue astelin nasal spray 2 sprays each nostril Twice daily as needed for nasal congestion/drainage Continue flonase nasal spray 1 spray each nostril daily Continue daily over the counter allergy medicine daily  Continue singulair 10 mg At bedtime  Continue supplemental  oxygen 2 lpm with activity to maintain oxygen >88-90%   Continue CPAP at night, minimum of 4-6 hours  High resolution CT chest scan   Pulmonary function testing ordered.   Follow up after PFTs with Dr. Lamonte Sakai or Alanson Aly. If symptoms do not improve or worsen, please contact office for sooner follow up or seek emergency care.    Asthma Pseudowheeze with forced expiration; otherwise no wheezing noted during auscultation while distracted. Advised that if cough does not improve or worsens, to notify so we can possibly do a round of abx and/or prednisone. Continue triple therapy with Trelegy and PRN nebs. Recommended mucociliary clearance therapies.   OSA on CPAP Excellent compliance and continues to receive good benefit. Followed by Dr. Maxwell Caul. Recently received new machine. Will try to get download to review.    I spent 35 minutes of dedicated to the care of this patient on the date of this encounter to include pre-visit review of records, face-to-face time with the patient discussing conditions above, post visit ordering of testing, clinical documentation with the electronic health record, making appropriate referrals as documented, and  communicating necessary findings to members of the patients care team.  Clayton Bibles, NP 10/03/2021  Pt aware and understands NP's role.

## 2021-10-03 NOTE — Patient Instructions (Addendum)
Continue Trelegy 1 puff daily. Brush tongue and rinse mouth afterwards  Continue Albuterol inhaler 2 puffs or 3 mL neb every 6 hours as needed for shortness of breath or wheezing. Notify if symptoms persist despite rescue inhaler/neb use. Continue astelin nasal spray 2 sprays each nostril Twice daily as needed for nasal congestion/drainage Continue flonase nasal spray 1 spray each nostril daily Continue daily over the counter allergy medicine daily  Continue singulair 10 mg At bedtime  Continue supplemental oxygen 2 lpm with activity to maintain oxygen >88-90%   Continue CPAP at night, minimum of 4-6 hours  High resolution CT chest scan   Pulmonary function testing ordered.   Follow up after PFTs with Dr. Lamonte Sakai or Alanson Aly. If symptoms do not improve or worsen, please contact office for sooner follow up or seek emergency care.

## 2021-10-03 NOTE — Assessment & Plan Note (Addendum)
Clinically improved since last week. Cough is slightly more productive however, WBC is nl and stable, and she feels significantly better overall. Oxygenation status has also improved. O2 was 98% on 3 lpm. She was placed on room air and maintained in the 90's. Walking oximetry performed - she did drop to 87%, requiring 2 lpm to maintain >88% with mobility. Recovered quickly upon rest. Advised close monitoring of symptoms.   Patient Instructions  Continue Trelegy 1 puff daily. Brush tongue and rinse mouth afterwards  Continue Albuterol inhaler 2 puffs or 3 mL neb every 6 hours as needed for shortness of breath or wheezing. Notify if symptoms persist despite rescue inhaler/neb use. Continue astelin nasal spray 2 sprays each nostril Twice daily as needed for nasal congestion/drainage Continue flonase nasal spray 1 spray each nostril daily Continue daily over the counter allergy medicine daily  Continue singulair 10 mg At bedtime  Continue supplemental oxygen 2 lpm with activity to maintain oxygen >88-90%   Continue CPAP at night, minimum of 4-6 hours  High resolution CT chest scan   Pulmonary function testing ordered.   Follow up after PFTs with Dr. Lamonte Sakai or Alanson Aly. If symptoms do not improve or worsen, please contact office for sooner follow up or seek emergency care.

## 2021-10-03 NOTE — Assessment & Plan Note (Signed)
Excellent compliance and continues to receive good benefit. Followed by Dr. Maxwell Caul. Recently received new machine. Will try to get download to review.

## 2021-10-03 NOTE — Assessment & Plan Note (Addendum)
Question whether her progressive DOE, weight loss, anorexia and increased oxygen requirements are related to underlying ILD or possible PAH. She does want to further investigate to find out the root cause of her problems, if possible. HRCT ordered for further evaluation given findings of scarring and fibrosis on recent CXR. She did have moderately elevated PASP on her echo from February - discussed that we may need to consider right heart cath for further workup. We will obtain repeat PFTs as she did have some restriction on her 2018 ones and need to determine if there has been progression of disease.

## 2021-10-03 NOTE — Assessment & Plan Note (Addendum)
Pseudowheeze with forced expiration; otherwise no wheezing noted during auscultation while distracted. Advised that if cough does not improve or worsens, to notify so we can possibly do a round of abx and/or prednisone. Continue triple therapy with Trelegy and PRN nebs. Recommended mucociliary clearance therapies.

## 2021-10-04 ENCOUNTER — Encounter: Payer: Self-pay | Admitting: Nurse Practitioner

## 2021-10-05 ENCOUNTER — Ambulatory Visit: Payer: Medicare Other

## 2021-10-05 ENCOUNTER — Telehealth: Payer: Self-pay

## 2021-10-05 NOTE — Telephone Encounter (Signed)
Called patient x 3 with no answer , left a voice mail message to return call to reschedule .  L.Camay Pedigo,LPN

## 2021-10-08 ENCOUNTER — Ambulatory Visit (INDEPENDENT_AMBULATORY_CARE_PROVIDER_SITE_OTHER)
Admission: RE | Admit: 2021-10-08 | Discharge: 2021-10-08 | Disposition: A | Discharge status: Home / Self Care | Payer: Medicare Other | Source: Ambulatory Visit | Attending: Nurse Practitioner | Admitting: Nurse Practitioner

## 2021-10-08 DIAGNOSIS — S2232XA Fracture of one rib, left side, initial encounter for closed fracture: Secondary | ICD-10-CM | POA: Diagnosis not present

## 2021-10-08 DIAGNOSIS — I700 Atherosclerosis of aorta: Secondary | ICD-10-CM | POA: Diagnosis not present

## 2021-10-08 DIAGNOSIS — J84112 Idiopathic pulmonary fibrosis: Secondary | ICD-10-CM | POA: Diagnosis not present

## 2021-10-08 DIAGNOSIS — R053 Chronic cough: Secondary | ICD-10-CM | POA: Diagnosis not present

## 2021-10-08 DIAGNOSIS — J479 Bronchiectasis, uncomplicated: Secondary | ICD-10-CM | POA: Diagnosis not present

## 2021-10-09 NOTE — Progress Notes (Signed)
Please notify patient that her HRCT did show some fibrotic changes/interstitial lung disease, seeming to be post COVID changes. We can discuss further at her OV and await PFTs to determine next steps. She also has evidence of cirrhosis of the liver. I know she has been working with her PCP on elevated LFTs so I would advise she reach out to him and notify him of these results. Thanks!

## 2021-10-10 ENCOUNTER — Other Ambulatory Visit: Payer: Medicare Other

## 2021-10-10 ENCOUNTER — Telehealth: Payer: Self-pay | Admitting: Internal Medicine

## 2021-10-10 ENCOUNTER — Telehealth: Payer: Self-pay | Admitting: Nurse Practitioner

## 2021-10-10 DIAGNOSIS — R112 Nausea with vomiting, unspecified: Secondary | ICD-10-CM

## 2021-10-10 DIAGNOSIS — J45901 Unspecified asthma with (acute) exacerbation: Secondary | ICD-10-CM

## 2021-10-10 NOTE — Telephone Encounter (Signed)
Ok Ct abd pelvis w CM - done

## 2021-10-10 NOTE — Telephone Encounter (Signed)
[name] called in and is requesting an order change.   States now order says CT abdomen/ pelvis with or without contrast- and it needs to say CT Abdomen/ pelvis with contrast.   Please submit Epic order again.   Callback #- [id number]

## 2021-10-10 NOTE — Telephone Encounter (Signed)
Imaging center called for clarification on weather imagining should be done with or without contrast, and requested that we submit new orders marked as contrast only.

## 2021-10-11 ENCOUNTER — Encounter: Payer: Self-pay | Admitting: Gastroenterology

## 2021-10-12 MED ORDER — ALBUTEROL SULFATE (2.5 MG/3ML) 0.083% IN NEBU: 2.5000 mg | INHALATION_SOLUTION | Freq: Four times a day (QID) | RESPIRATORY_TRACT | 12 refills | Status: AC | PRN

## 2021-10-12 NOTE — Telephone Encounter (Signed)
Called patient and let her know that I sent in her refills as requested. But I saw that she wanted some test results. I advised her to call the office back to let us know what test results.

## 2021-10-14 DIAGNOSIS — G4733 Obstructive sleep apnea (adult) (pediatric): Secondary | ICD-10-CM | POA: Diagnosis not present

## 2021-10-16 DIAGNOSIS — J441 Chronic obstructive pulmonary disease with (acute) exacerbation: Secondary | ICD-10-CM | POA: Diagnosis not present

## 2021-10-16 DIAGNOSIS — G4733 Obstructive sleep apnea (adult) (pediatric): Secondary | ICD-10-CM | POA: Diagnosis not present

## 2021-10-18 DIAGNOSIS — J441 Chronic obstructive pulmonary disease with (acute) exacerbation: Secondary | ICD-10-CM | POA: Diagnosis not present

## 2021-10-18 DIAGNOSIS — J9601 Acute respiratory failure with hypoxia: Secondary | ICD-10-CM | POA: Diagnosis not present

## 2021-10-22 ENCOUNTER — Encounter: Payer: Self-pay | Admitting: Internal Medicine

## 2021-10-22 ENCOUNTER — Other Ambulatory Visit: Payer: Self-pay | Admitting: Interventional Cardiology

## 2021-10-22 DIAGNOSIS — H04123 Dry eye syndrome of bilateral lacrimal glands: Secondary | ICD-10-CM | POA: Diagnosis not present

## 2021-10-22 DIAGNOSIS — H40013 Open angle with borderline findings, low risk, bilateral: Secondary | ICD-10-CM | POA: Diagnosis not present

## 2021-10-23 ENCOUNTER — Ambulatory Visit: Payer: Medicare Other | Admitting: Internal Medicine

## 2021-11-02 ENCOUNTER — Ambulatory Visit (INDEPENDENT_AMBULATORY_CARE_PROVIDER_SITE_OTHER): Payer: Medicare Other

## 2021-11-02 VITALS — Ht 66.0 in | Wt 228.0 lb

## 2021-11-02 DIAGNOSIS — Z0000 Encounter for general adult medical examination without abnormal findings: Secondary | ICD-10-CM

## 2021-11-02 NOTE — Progress Notes (Signed)
Subjective:   [name] is a 86 y.o. female who presents for Medicare Annual (Subsequent) preventive examination. Virtual Visit via Telephone Note  I connected with  [name] on 11/02/21 at  9:15 AM EDT by telephone and verified that I am speaking with the correct person using two identifiers.  Location: Patient: HOME Provider: [facility] Persons participating in the virtual visit: patient/Nurse Health Advisor   I discussed the limitations, risks, security and privacy concerns of performing an evaluation and management service by telephone and the availability of in person appointments. The patient expressed understanding and agreed to proceed.  Interactive audio and video telecommunications were attempted between this nurse and patient, however failed, due to patient having technical difficulties OR patient did not have access to video capability.  We continued and completed visit with audio only.  Some vital signs may be absent or patient reported.   Mercedes Driver, LPN  Review of Systems     Cardiac Risk Factors include: advanced age (>70men, >88 women);hypertension;dyslipidemia;sedentary lifestyle;obesity (BMI >30kg/m2)     Objective:    Today's Vitals   11/02/21 0905  Weight: 228 lb (103.4 kg)  Height: $Remove'5\' 6"'tsrZSEw$  (1.676 m)   Body mass index is 36.8 kg/m.     11/02/2021    9:17 AM 06/26/2021   12:00 PM 06/13/2021    4:08 PM 06/13/2021    9:43 AM 05/08/2021    8:44 AM 10/10/2020   11:08 AM 02/08/2020    8:42 AM  Advanced Directives  Does Patient Have a Medical Advance Directive? Yes Yes Yes Yes Yes Yes Yes  Type of Paramedic of [place];Living will Living will Living will   [place];Living will [place];Living will  Does patient want to make changes to medical advance directive?  No - Patient declined No - Patient declined No - Patient declined   No - Patient declined  Copy of [place] in  Chart? No - copy requested      No - copy requested    Current Medications (verified) Outpatient Encounter Medications as of 11/02/2021  Medication Sig   albuterol (PROVENTIL) (2.5 MG/3ML) 0.083% nebulizer solution Take 3 mLs (2.5 mg total) by nebulization every 6 (six) hours as needed for wheezing or shortness of breath.   ALLERGY RELIEF 180 MG tablet TAKE 1 TABLET BY MOUTH EVERY DAY   allopurinol (ZYLOPRIM) 300 MG tablet Take 300 mg by mouth daily.   ALPRAZolam (XANAX) 0.5 MG tablet TAKE 1 TO 2 TABLETS BY MOUTH TWICE DAILY AS NEEDED FOR ANXIETY OR SLEEP   Ascorbic Acid (VITAMIN C) 1000 MG tablet Take 1,000 mg by mouth in the morning.   atorvastatin (LIPITOR) 20 MG tablet Take 1 tablet (20 mg total) by mouth daily.   azelastine (ASTELIN) 0.1 % nasal spray Place 2 sprays into both nostrils daily. Use in each nostril as directed (Patient taking differently: Place 2 sprays into both nostrils in the morning.)   B Complex Vitamins (VITAMIN B COMPLEX PO) Take 1 tablet by mouth daily.   Biotin 5000 MCG CAPS Take 5,000 mcg by mouth daily.    cholecalciferol (VITAMIN D) 1000 UNITS tablet Take 1,000 Units by mouth daily.   Choline Dihydrogen Citrate (CHOLINE CITRATE PO) Take 1 capsule by mouth in the morning.   co-enzyme Q-10 50 MG capsule Take 100 mg by mouth daily.   colchicine 0.6 MG tablet Take 0.6 mg by mouth daily as needed (  flare  up).    Cyanocobalamin (VITAMIN B-12 PO) Take 1 tablet by mouth daily.   cycloSPORINE (RESTASIS) 0.05 % ophthalmic emulsion Apply 1 drop to eye 2 (two) times daily as needed (allergies).   dextromethorphan-guaiFENesin (MUCINEX DM) 30-600 MG 12hr tablet Take 1 tablet by mouth 2 (two) times daily.   EPINEPHrine 0.3 mg/0.3 mL IJ SOAJ injection Inject 0.3 mg into the muscle as needed for anaphylaxis.   escitalopram (LEXAPRO) 10 MG tablet TAKE 1 TABLET(10 MG) BY MOUTH DAILY   ezetimibe (ZETIA) 10 MG tablet Take 10 mg by mouth 2 (two) times a week. Take on Tues & Fri    fluticasone (FLONASE) 50 MCG/ACT nasal spray Place 1 spray into both nostrils every morning.   fluticasone-salmeterol (ADVAIR) 500-50 MCG/ACT AEPB Inhale 1 puff into the lungs in the morning and at bedtime.   gabapentin (NEURONTIN) 300 MG capsule Take 1 capsule (300 mg total) by mouth 3 (three) times daily. (Patient taking differently: Take 300 mg by mouth at bedtime.)   Homeopathic Products (ARNICARE) GEL Apply 1 application topically daily as needed (soreness).   hydrochlorothiazide (HYDRODIURIL) 25 MG tablet TAKE 2 TABLETS(50 MG) BY MOUTH DAILY   levalbuterol (XOPENEX HFA) 45 MCG/ACT inhaler Inhale 2 puffs into the lungs every 6 (six) hours as needed for wheezing or shortness of breath.   levocetirizine (XYZAL) 5 MG tablet Take 1 tablet (5 mg total) by mouth every evening.   levothyroxine (SYNTHROID) 75 MCG tablet Take 1 tablet (75 mcg total) by mouth daily before breakfast.   metoprolol succinate (TOPROL-XL) 25 MG 24 hr tablet TAKE 1/2 TABLET(12.5 MG) BY MOUTH DAILY   montelukast (SINGULAIR) 10 MG tablet Take 1 tablet (10 mg total) by mouth at bedtime.   nitrofurantoin (MACRODANTIN) 50 MG capsule Take 50 mg by mouth 3 (three) times a week. Take three days a week (MWF)   Omega-3 Fatty Acids (FISH OIL) 1000 MG CAPS Take 1 capsule by mouth daily.   ondansetron (ZOFRAN) 4 MG tablet Take 1 tablet (4 mg total) by mouth every 8 (eight) hours as needed for nausea or vomiting.   potassium chloride SA (KLOR-CON M) 20 MEQ tablet TAKE 1 TABLET(20 MEQ) BY MOUTH DAILY   Pyridoxine HCl (VITAMIN B-6 PO) Take 1 tablet by mouth daily.   [place] 420 MG/3.5ML SOCT INJECT 1 UNIT INTO THE SKIN EVERY 30 DAYS   TART CHERRY PO Take 1 capsule by mouth in the morning and at bedtime.   VITAMIN E PO Take 1 tablet by mouth daily.   XARELTO 20 MG TABS tablet TAKE 1 TABLET(20 MG) BY MOUTH DAILY WITH SUPPER   Zinc 100 MG TABS Take 1 tablet by mouth in the morning.   No facility-administered encounter  medications on file as of 11/02/2021.    Allergies (verified) Crestor [rosuvastatin calcium], Hydrocodone-acetaminophen, Lidocaine hcl, Lipitor [atorvastatin calcium], Procaine, Rosuvastatin, Theophylline, Vicodin [hydrocodone-acetaminophen], Codeine, Ephedrine, Other, Oxycodone, Oxycodone-acetaminophen, Propoxyphene, Quinine derivatives, Sulfa antibiotics, Epinephrine, Penicillins, Quinine, and Theophyllines   History: Past Medical History:  Diagnosis Date   Allergy    Anxiety    Arthritis    no cartlidge in knees   Asthma    Bradycardia    a. atenolol stopped due to this, HR 40s.   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  CAD s/p RCA stent 2000 with residual mild-mod disease of left system 2001.   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   Osteoporosis 03/24/2015   Peripheral neuropathy 03/24/2015   Plantar fasciitis    Sleep apnea    Past Surgical History:  Procedure Laterality Date   BREAST LUMPECTOMY W/ NEEDLE LOCALIZATION  01/29/2011   Left with [facility] Dr Margot Chimes   CHOLECYSTECTOMY  1955   CORONARY ANGIOPLASTY WITH STENT PLACEMENT  2000   DILATION AND CURETTAGE OF UTERUS  1976   and [place]   tendons between thumb and forefinger   SKIN BIOPSY     1994, 2006, 2008, 2010, 2011, 2011, 2012-  pre-cancerous   TONSILLECTOMY  1955   Family History  Problem Relation Age of Onset   Heart disease Father    Cancer Father        intestinal polyps   Heart attack Father    Asthma Sister    Heart disease Brother    Heart attack Brother    Social History   Socioeconomic History   Marital status:  Widowed    Spouse name: Not on file   Number of children: 2   Years of education: Not on file   Highest education level: Not on file  Occupational History   Not on file  Tobacco Use   Smoking status: Former    Packs/day: 1.00    Years: 4.00    Total pack years: 4.00    Types: Cigarettes    Quit date: 12/05/1962    Years since quitting: 58.9   Smokeless tobacco: Never  Vaping Use   Vaping Use: Never used  Substance and Sexual Activity   Alcohol use: No   Drug use: No   Sexual activity: Not Currently  Other Topics Concern   Not on file  Social History Narrative   Right handed.   Husband passed 04/28/2021. Married x 63 years.   2 daughters, who live nearby.    Grew up in [place], at [employer] to swim.    Social Determinants of Health   Financial Resource Strain: Low Risk  (11/02/2021)   Overall Financial Resource Strain (CARDIA)    Difficulty of Paying Living Expenses: Not hard at all  Food Insecurity: No Food Insecurity (11/02/2021)   Hunger Vital Sign    Worried About Running Out of Food in the Last Year: Never true    Ran Out of Food in the Last Year: Never true  Transportation Needs: No Transportation Needs (11/02/2021)   PRAPARE - Hydrologist (Medical): No    Lack of Transportation (Non-Medical): No  Physical Activity: Inactive (11/02/2021)   Exercise Vital Sign    Days of Exercise per Week: 0 days    Minutes of Exercise per Session: 0 min  Stress: No Stress Concern Present (11/02/2021)   [place]    Feeling of Stress : Not at all  Social Connections: [place] (11/02/2021)   Social Connection and Isolation Panel [NHANES]    Frequency of Communication with Friends and Family: More than three times a week    Frequency of Social Gatherings with Friends and Family: More than three  times a week    Attends Religious Services: More than 4 times per year     Active Member of Clubs or Organizations: Yes    Attends Music therapist: More than 4 times per year    Marital Status: Married    Tobacco Counseling Counseling given: Not Answered   Clinical Intake:  Pre-visit preparation completed: Yes        BMI - recorded: 36.3 Nutritional Status: BMI > 30  Obese Nutritional Risks: None Diabetes: No  How often do you need to have someone help you when you read instructions, pamphlets, or other written materials from your doctor or pharmacy?: 1 - Never  Diabetic?NO  Interpreter Needed?: No  Information entered by :: mj Elzy Tomasello, lpn   Activities of Daily Living    11/02/2021    9:22 AM 06/27/2021    3:00 PM  In your present state of health, do you have any difficulty performing the following activities:  Hearing? 0   Vision? 0   Difficulty concentrating or making decisions? 0   Walking or climbing stairs? 1   Dressing or bathing? 0   Doing errands, shopping? 0 1  Preparing Food and eating ? N   Using the Toilet? N   In the past six months, have you accidently leaked urine? Y   Comment Incontinent.   Do you have problems with loss of bowel control? N   Managing your Medications? N   Managing your Finances? N   Housekeeping or managing your Housekeeping? N     Patient Care Team: Biagio Borg, MD as PCP - General (Internal Medicine) Jettie Booze, MD as PCP - Cardiology (Cardiology) Neldon Mc, MD as Surgeon (General Surgery) Magrinat, Virgie Dad, MD (Inactive) (Hematology and Oncology) Thea Silversmith, MD (Radiation Oncology)  Indicate any recent Medical Services you may have received from other than [facility] providers in the past year (date may be approximate).     Assessment:   This is a routine wellness examination for Marian.  Hearing/Vision screen Hearing Screening - Comments:: No hearing issues.  Vision Screening - Comments:: No glasses. Dr. Herbert Deaner. 10/2021.  Dietary issues and exercise  activities discussed: Current Exercise Habits: The patient does not participate in regular exercise at present, Exercise limited by: cardiac condition(s);orthopedic condition(s);respiratory conditions(s)   Goals Addressed             This Visit's Progress    Exercise 3x per week (30 min per time)       Encouraged chair exercises.  Try to stay as active as possible.        Depression Screen    11/02/2021    9:10 AM 04/24/2021   10:09 AM 10/23/2020   12:30 PM 04/21/2020    9:49 AM 02/08/2020    8:47 AM 01/21/2020    9:03 AM 09/01/2019    1:14 PM  PHQ 2/9 Scores  PHQ - 2 Score 0 0 0 0 0 0 0  PHQ- 9 Score      0 0    Fall Risk    11/02/2021    9:19 AM 05/08/2021    8:44 AM 04/24/2021   10:09 AM 10/23/2020   12:30 PM 10/10/2020   11:08 AM  Fall Risk   Falls in the past year? 0 1 0 0 1  Number falls in past yr: 0 1 0 0 1  Injury with Fall? 0 0 0 0 0  Risk for fall due to : No Fall  Risks      Follow up Falls prevention discussed        FALL RISK PREVENTION PERTAINING TO THE HOME:  Any stairs in or around the home? Yes  If so, are there any without handrails? No  Home free of loose throw rugs in walkways, pet beds, electrical cords, etc? Yes  Adequate lighting in your home to reduce risk of falls? Yes   ASSISTIVE DEVICES UTILIZED TO PREVENT FALLS:  Life alert? No  Use of a cane, walker or w/c? Yes  Grab bars in the bathroom? Yes  Shower chair or bench in shower? Yes  Elevated toilet seat or a handicapped toilet? No   TIMED UP AND GO:  Was the test performed? No .  PHONE VISIT.   Cognitive Function:        11/02/2021    9:23 AM  6CIT Screen  What Year? 0 points  What month? 0 points  What time? 0 points  Count back from 20 0 points  Months in reverse 0 points  Repeat phrase 0 points  Total Score 0 points    Immunizations Immunization History  Administered Date(s) Administered   Fluad Quad(high Dose 65+) 03/30/2019, 01/21/2020, 04/24/2021   Influenza  Split 04/06/2009, 04/05/2010, 01/10/2011, 02/10/2012, 04/23/2013   Influenza, High Dose Seasonal PF 03/20/2017, 01/19/2018   Influenza-Unspecified 05/08/2015, 01/23/2016   PFIZER(Purple Top)SARS-COV-2 Vaccination 06/11/2019, 07/06/2019, 02/28/2020   Pneumococcal Conjugate-13 06/01/2013, 03/20/2017   Pneumococcal Polysaccharide-23 03/16/2003, 01/23/2016   Td 12/08/2009   Zoster, Live 06/07/2015    TDAP status: Due, Education has been provided regarding the importance of this vaccine. Advised may receive this vaccine at local pharmacy or Health Dept. Aware to provide a copy of the vaccination record if obtained from local pharmacy or Health Dept. Verbalized acceptance and understanding.  Flu Vaccine status: Up to date  Pneumococcal vaccine status: Up to date  Covid-19 vaccine status: Completed vaccines  Qualifies for Shingles Vaccine? Yes   Zostavax completed Yes   Shingrix Completed?: No.    Education has been provided regarding the importance of this vaccine. Patient has been advised to call insurance company to determine out of pocket expense if they have not yet received this vaccine. Advised may also receive vaccine at local pharmacy or Health Dept. Verbalized acceptance and understanding.  Screening Tests Health Maintenance  Topic Date Due   Zoster Vaccines- Shingrix (1 of 2) 12/29/2021 (Originally 08/24/1954)   COVID-19 Vaccine (4 - Booster for Pfizer series) 03/05/2022 (Originally 04/24/2020)   TETANUS/TDAP  05/03/2022 (Originally 05/06/2020)   INFLUENZA VACCINE  12/04/2021   Pneumonia Vaccine 67+ Years old  Completed   DEXA SCAN  Completed   HPV VACCINES  Aged Out    Health Maintenance  There are no preventive care reminders to display for this patient.   Colorectal cancer screening: No longer required.   Mammogram status: Completed 02/20/2021. Repeat every year  Bone Density status: Completed 09/04/2014. Results reflect: Bone density results: OSTEOPOROSIS. Repeat every 2  years.  Lung Cancer Screening: (Low Dose CT Chest recommended if Age 81-80 years, 30 pack-year currently smoking OR have quit w/in 15years.) does not qualify.   Additional Screening:  Hepatitis C Screening: does not qualify;   Vision Screening: Recommended annual ophthalmology exams for early detection of glaucoma and other disorders of the eye. Is the patient up to date with their annual eye exam?  Yes  Who is the provider or what is the name of the office in which the patient attends  annual eye exams? [facility] If pt is not established with a provider, would they like to be referred to a provider to establish care? No .   Dental Screening: Recommended annual dental exams for proper oral hygiene  Community Resource Referral / Chronic Care Management: CRR required this visit?  No   CCM required this visit?  No      Plan:     I have personally reviewed and noted the following in the patient's chart:   Medical and social history Use of alcohol, tobacco or illicit drugs  Current medications and supplements including opioid prescriptions.  Functional ability and status Nutritional status Physical activity Advanced directives List of other physicians Hospitalizations, surgeries, and ER visits in previous 12 months Vitals Screenings to include cognitive, depression, and falls Referrals and appointments  In addition, I have reviewed and discussed with patient certain preventive protocols, quality metrics, and best practice recommendations. A written personalized care plan for preventive services as well as general preventive health recommendations were provided to patient.     Mercedes Driver, LPN   3/82/5053   Nurse Notes: Discussed Shingrix and how to obtain.

## 2021-11-02 NOTE — Patient Instructions (Signed)
[name] , Thank you for taking time to come for your Medicare Wellness Visit. I appreciate your ongoing commitment to your health goals. Please review the following plan we discussed and let me know if I can assist you in the future.   Screening recommendations/referrals: Colonoscopy: No longer required. Mammogram: Done 02/20/2021 Repeat annually  Bone Density: Done 09/04/2014. Repeat every 2 years  Recommended yearly ophthalmology/optometry visit for glaucoma screening and checkup Recommended yearly dental visit for hygiene and checkup  Vaccinations: Influenza vaccine: Done 04/24/2021 Repeat annually  Pneumococcal vaccine: Done 03/23/2017 and 01/23/2016. Tdap vaccine: Done 12/08/2009 Repeat in 10 years  Shingles vaccine: Done 06/07/2015. Shingrix available at your local pharmacy.   Covid-19:Done 02/28/2020, 07/06/2019 and 06/11/2019.  Advanced directives: Aim for 30 minutes of exercise or brisk walking, 6-8 glasses of water, and 5 servings of fruits and vegetables each day.  Conditions/risks identified: Aim for 30 minutes of exercise, 6-8 glasses of water, and 5 servings of fruits and vegetables each day.   Next appointment: Follow up in one year for your annual wellness visit 2024.   Preventive Care 38 Years and Older, Female Preventive care refers to lifestyle choices and visits with your health care provider that can promote health and wellness. What does preventive care include? A yearly physical exam. This is also called an annual well check. Dental exams once or twice a year. Routine eye exams. Ask your health care provider how often you should have your eyes checked. Personal lifestyle choices, including: Daily care of your teeth and gums. Regular physical activity. Eating a healthy diet. Avoiding tobacco and drug use. Limiting alcohol use. Practicing safe sex. Taking low-dose aspirin every day. Taking vitamin and mineral supplements as recommended by your health care  provider. What happens during an annual well check? The services and screenings done by your health care provider during your annual well check will depend on your age, overall health, lifestyle risk factors, and family history of disease. Counseling  Your health care provider may ask you questions about your: Alcohol use. Tobacco use. Drug use. Emotional well-being. Home and relationship well-being. Sexual activity. Eating habits. History of falls. Memory and ability to understand (cognition). Work and work Statistician. Reproductive health. Screening  You may have the following tests or measurements: Height, weight, and BMI. Blood pressure. Lipid and cholesterol levels. These may be checked every 5 years, or more frequently if you are over 71 years old. Skin check. Lung cancer screening. You may have this screening every year starting at age 60 if you have a 30-pack-year history of smoking and currently smoke or have quit within the past 15 years. Fecal occult blood test (FOBT) of the stool. You may have this test every year starting at age 48. Flexible sigmoidoscopy or colonoscopy. You may have a sigmoidoscopy every 5 years or a colonoscopy every 10 years starting at age 37. Hepatitis C blood test. Hepatitis B blood test. Sexually transmitted disease (STD) testing. Diabetes screening. This is done by checking your blood sugar (glucose) after you have not eaten for a while (fasting). You may have this done every 1-3 years. Bone density scan. This is done to screen for osteoporosis. You may have this done starting at age 7. Mammogram. This may be done every 1-2 years. Talk to your health care provider about how often you should have regular mammograms. Talk with your health care provider about your test results, treatment options, and if necessary, the need for more tests. Vaccines  Your health  care provider may recommend certain vaccines, such as: Influenza vaccine. This is  recommended every year. Tetanus, diphtheria, and acellular pertussis (Tdap, Td) vaccine. You may need a Td booster every 10 years. Zoster vaccine. You may need this after age 75. Pneumococcal 13-valent conjugate (PCV13) vaccine. One dose is recommended after age 84. Pneumococcal polysaccharide (PPSV23) vaccine. One dose is recommended after age 61. Talk to your health care provider about which screenings and vaccines you need and how often you need them. This information is not intended to replace advice given to you by your health care provider. Make sure you discuss any questions you have with your health care provider. Document Released: 05/19/2015 Document Revised: 01/10/2016 Document Reviewed: 02/21/2015 Elsevier Interactive Patient Education  2017 [place] Prevention in the Home Falls can cause injuries. They can happen to people of all ages. There are many things you can do to make your home safe and to help prevent falls. What can I do on the outside of my home? Regularly fix the edges of walkways and driveways and fix any cracks. Remove anything that might make you trip as you walk through a door, such as a raised step or threshold. Trim any bushes or trees on the path to your home. Use bright outdoor lighting. Clear any walking paths of anything that might make someone trip, such as rocks or tools. Regularly check to see if handrails are loose or broken. Make sure that both sides of any steps have handrails. Any raised decks and porches should have guardrails on the edges. Have any leaves, snow, or ice cleared regularly. Use sand or salt on walking paths during winter. Clean up any spills in your garage right away. This includes oil or grease spills. What can I do in the bathroom? Use night lights. Install grab bars by the toilet and in the tub and shower. Do not use towel bars as grab bars. Use non-skid mats or decals in the tub or shower. If you need to sit down in  the shower, use a plastic, non-slip stool. Keep the floor dry. Clean up any water that spills on the floor as soon as it happens. Remove soap buildup in the tub or shower regularly. Attach bath mats securely with double-sided non-slip rug tape. Do not have throw rugs and other things on the floor that can make you trip. What can I do in the bedroom? Use night lights. Make sure that you have a light by your bed that is easy to reach. Do not use any sheets or blankets that are too big for your bed. They should not hang down onto the floor. Have a firm chair that has side arms. You can use this for support while you get dressed. Do not have throw rugs and other things on the floor that can make you trip. What can I do in the kitchen? Clean up any spills right away. Avoid walking on wet floors. Keep items that you use a lot in easy-to-reach places. If you need to reach something above you, use a strong step stool that has a grab bar. Keep electrical cords out of the way. Do not use floor polish or wax that makes floors slippery. If you must use wax, use non-skid floor wax. Do not have throw rugs and other things on the floor that can make you trip. What can I do with my stairs? Do not leave any items on the stairs. Make sure that there are handrails on  both sides of the stairs and use them. Fix handrails that are broken or loose. Make sure that handrails are as long as the stairways. Check any carpeting to make sure that it is firmly attached to the stairs. Fix any carpet that is loose or worn. Avoid having throw rugs at the top or bottom of the stairs. If you do have throw rugs, attach them to the floor with carpet tape. Make sure that you have a light switch at the top of the stairs and the bottom of the stairs. If you do not have them, ask someone to add them for you. What else can I do to help prevent falls? Wear shoes that: Do not have high heels. Have rubber bottoms. Are comfortable  and fit you well. Are closed at the toe. Do not wear sandals. If you use a stepladder: Make sure that it is fully opened. Do not climb a closed stepladder. Make sure that both sides of the stepladder are locked into place. Ask someone to hold it for you, if possible. Clearly mark and make sure that you can see: Any grab bars or handrails. First and last steps. Where the edge of each step is. Use tools that help you move around (mobility aids) if they are needed. These include: Canes. Walkers. Scooters. Crutches. Turn on the lights when you go into a dark area. Replace any light bulbs as soon as they burn out. Set up your furniture so you have a clear path. Avoid moving your furniture around. If any of your floors are uneven, fix them. If there are any pets around you, be aware of where they are. Review your medicines with your doctor. Some medicines can make you feel dizzy. This can increase your chance of falling. Ask your doctor what other things that you can do to help prevent falls. This information is not intended to replace advice given to you by your health care provider. Make sure you discuss any questions you have with your health care provider. Document Released: 02/16/2009 Document Revised: 09/28/2015 Document Reviewed: 05/27/2014 Elsevier Interactive Patient Education  2017 [employer].

## 2021-11-05 ENCOUNTER — Ambulatory Visit
Admission: RE | Admit: 2021-11-05 | Discharge: 2021-11-05 | Disposition: A | Discharge status: Home / Self Care | Payer: Medicare Other | Source: Ambulatory Visit | Attending: Internal Medicine | Admitting: Internal Medicine

## 2021-11-05 ENCOUNTER — Telehealth: Payer: Self-pay | Admitting: *Deleted

## 2021-11-05 DIAGNOSIS — R634 Abnormal weight loss: Secondary | ICD-10-CM | POA: Diagnosis not present

## 2021-11-05 DIAGNOSIS — R112 Nausea with vomiting, unspecified: Secondary | ICD-10-CM | POA: Diagnosis not present

## 2021-11-05 DIAGNOSIS — K449 Diaphragmatic hernia without obstruction or gangrene: Secondary | ICD-10-CM | POA: Diagnosis not present

## 2021-11-05 DIAGNOSIS — K7460 Unspecified cirrhosis of liver: Secondary | ICD-10-CM | POA: Diagnosis not present

## 2021-11-05 MED ORDER — IOPAMIDOL (ISOVUE-300) INJECTION 61%
100.0000 mL | Freq: Once | INTRAVENOUS | Status: AC | PRN
  Administered 2021-11-05: 100 mL via INTRAVENOUS

## 2021-11-05 NOTE — Telephone Encounter (Signed)
ATC patient x1 regarding CPAP machine, need to know if she has a CPAP machine, if she is wearing it and if she has a memory card in his machine if she could bring it to her visit on Wednesday.  LVM to return call.

## 2021-11-06 ENCOUNTER — Encounter: Payer: Self-pay | Admitting: Internal Medicine

## 2021-11-06 DIAGNOSIS — K7460 Unspecified cirrhosis of liver: Secondary | ICD-10-CM | POA: Insufficient documentation

## 2021-11-07 ENCOUNTER — Ambulatory Visit: Payer: Medicare Other | Admitting: Nurse Practitioner

## 2021-11-07 ENCOUNTER — Encounter: Payer: Self-pay | Admitting: Nurse Practitioner

## 2021-11-07 ENCOUNTER — Ambulatory Visit (INDEPENDENT_AMBULATORY_CARE_PROVIDER_SITE_OTHER): Payer: Medicare Other | Admitting: Emergency Medicine

## 2021-11-07 VITALS — BP 104/60 | HR 88 | Temp 98.5°F | Ht 66.0 in | Wt 233.4 lb

## 2021-11-07 DIAGNOSIS — J849 Interstitial pulmonary disease, unspecified: Secondary | ICD-10-CM

## 2021-11-07 DIAGNOSIS — Z9989 Dependence on other enabling machines and devices: Secondary | ICD-10-CM

## 2021-11-07 DIAGNOSIS — R053 Chronic cough: Secondary | ICD-10-CM

## 2021-11-07 DIAGNOSIS — J301 Allergic rhinitis due to pollen: Secondary | ICD-10-CM

## 2021-11-07 DIAGNOSIS — I272 Pulmonary hypertension, unspecified: Secondary | ICD-10-CM | POA: Diagnosis not present

## 2021-11-07 DIAGNOSIS — R0602 Shortness of breath: Secondary | ICD-10-CM

## 2021-11-07 DIAGNOSIS — J9611 Chronic respiratory failure with hypoxia: Secondary | ICD-10-CM

## 2021-11-07 DIAGNOSIS — J4530 Mild persistent asthma, uncomplicated: Secondary | ICD-10-CM | POA: Diagnosis not present

## 2021-11-07 DIAGNOSIS — J479 Bronchiectasis, uncomplicated: Secondary | ICD-10-CM | POA: Insufficient documentation

## 2021-11-07 DIAGNOSIS — G4733 Obstructive sleep apnea (adult) (pediatric): Secondary | ICD-10-CM | POA: Diagnosis not present

## 2021-11-07 LAB — PULMONARY FUNCTION TEST
DL/VA % pred: 75 %
DL/VA: 3.03 ml/min/mmHg/L
DLCO cor % pred: 63 %
DLCO cor: 12.62 ml/min/mmHg
DLCO unc % pred: 59 %
DLCO unc: 11.82 ml/min/mmHg
FEF 25-75 Post: 2.91 L/sec
FEF 25-75 Pre: 1.85 L/sec
FEF2575-%Change-Post: 57 %
FEF2575-%Pred-Post: 220 %
FEF2575-%Pred-Pre: 139 %
FEV1-%Change-Post: 11 %
FEV1-%Pred-Post: 92 %
FEV1-%Pred-Pre: 82 %
FEV1-Post: 1.84 L
FEV1-Pre: 1.64 L
FEV1FVC-%Change-Post: 4 %
FEV1FVC-%Pred-Pre: 115 %
FEV6-%Change-Post: 6 %
FEV6-%Pred-Post: 81 %
FEV6-%Pred-Pre: 76 %
FEV6-Post: 2.07 L
FEV6-Pre: 1.94 L
FEV6FVC-%Change-Post: 0 %
FEV6FVC-%Pred-Post: 106 %
FEV6FVC-%Pred-Pre: 105 %
FVC-%Change-Post: 7 %
FVC-%Pred-Post: 78 %
FVC-%Pred-Pre: 72 %
FVC-Post: 2.09 L
FVC-Pre: 1.95 L
Post FEV1/FVC ratio: 88 %
Post FEV6/FVC ratio: 100 %
Pre FEV1/FVC ratio: 84 %
Pre FEV6/FVC Ratio: 100 %
RV % pred: 54 %
RV: 1.4 L
TLC % pred: 66 %
TLC: 3.57 L

## 2021-11-07 NOTE — Assessment & Plan Note (Signed)
Excellent compliance. Followed by Dr. Maxwell Caul.

## 2021-11-07 NOTE — Assessment & Plan Note (Signed)
BTX on HRCT. Not currently having any cough or chest congestion. Advised on role of mucociliary clearance therapies in BTX. Recommended she use mucinex and flutter valve if she develops cough, chest congestion and/or increased SOB.

## 2021-11-07 NOTE — Progress Notes (Signed)
Full PFT Performed Today  

## 2021-11-07 NOTE — Assessment & Plan Note (Signed)
Resolved hypoxia with ambulation. She was able to walk 3 laps today on room air with lowest SpO2 91%. Advised she continue with nocturnal O2 bled through her CPAP. Monitor O2 levels at home for goal >88-90%

## 2021-11-07 NOTE — Assessment & Plan Note (Signed)
Enlarged pulmonary artery on recent HRCT and moderately elevated PASP on echo from 06/2021. Prefers to hold off on further testing at this point. She appears euvolemic on exam today, and dyspnea and hypoxia have significantly improved so agree with this plan. We will monitor her respiratory symptoms/activity tolerance. Follow up with cardiology as scheduled.

## 2021-11-07 NOTE — Assessment & Plan Note (Addendum)
She had progressive DOE and slow to resolve hypoxia after her hospitalization. She also had some ground glass changes on CT from February. We obtained a HRCT to evaluate for ILD. 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 Her PFTs showed a restrictive defect with FVC 72, TLC 66, and DLCOcor 63. We discussed possible next steps and given her improvement over the past month, she would like to avoid further testing and continue with surveillance so we will plan to repeat imaging and spiro/DLCO in 6 months.   Patient Instructions  Continue Trelegy 1 puff daily. Brush tongue and rinse mouth afterwards  Continue Albuterol inhaler 2 puffs or 3 mL neb every 6 hours as needed for shortness of breath or wheezing. Notify if symptoms persist despite rescue inhaler/neb use. Continue astelin nasal spray 2 sprays each nostril Twice daily as needed for nasal congestion/drainage Continue flonase nasal spray 1 spray each nostril daily Continue daily over the counter allergy medicine daily  Continue singulair 10 mg At bedtime    Continue CPAP at night with oxygen bled through, minimum of 4-6 hours  You can use mucinex 600 mg Twice daily as needed for chest congestion/cough Flutter valve 2-3 times a day for as needed for chest congestion/cough    Follow up in 3 months with Dr. Lamonte Sakai. If symptoms do not improve or worsen, please contact office for sooner follow up or seek emergency care.

## 2021-11-07 NOTE — Progress Notes (Signed)
$'@Patient'b$  ID: [name], female    DOB: Mar 23, 1936, 86 y.o.   MRN: [id number]  No chief complaint on file.   Referring provider: Biagio Borg, MD  HPI: 86 year old female, former remote smoker followed for asthma, mixed obstructive and restrictive airway disease and allergic rhinitis.  She is a patient Dr. Agustina Caroli last seen in office on 10/03/2021.  Past medical history significant for OSA on CPAP (followed by Dr. Elenore Rota), CAD, CHF, hypertension, sinus node dysfunction, A-fib on Xarelto, dysphagia, hypothyroid, peripheral neuropathy, osteoarthritis, stage Ia left breast cancer 2012, HLD, obesity, gout, prediabetes.  TEST/EVENTS:  06/13/2021 echocardiogram: EF 60 to 65%.  LVH.  Unable to determine diastolic function.  Moderately elevated PASP.   06/15/2021 CTA chest: Satisfactory opacification of the pulmonary arteries with no evidence of PE.  There is enlargement of the pulmonary artery, suggesting pulmonary hypertension.  Atherosclerosis is present.  Multifocal areas of airspace consolidation throughout both lungs, mostly groundglass appearance, concerning for multifocal pneumonia.  Stable left renal cyst and 10 mm left renal stone.  Small hiatal hernia present. 09/25/2021 CXR 2 view: lungs are hyperinflated. There is moderate to marked severity b/l areas of scarring and fibrosis, again noted and most prominent in the b/l upper lobes, mid left lung and b/l lung bases.   06/26/2021: OV with Mercedes Amer NP for hospital follow-up after being treated for multifocal pneumonia.  Reported having new fevers up to Tmax of 102.  Shortness of breath was worse.  Also coughing up more purulent sputum.  Given worsening of symptoms, patient was taken to CXR which showed persistent airspace opacities.  When she returned to the room from chest x-ray she was in mild respiratory distress, requiring supplemental O2 due to desaturations to the 70s.  EMS was contacted for transport to ED as patient had failed outpatient  therapy.  07/25/2021: OV with Dr. Lamonte Sakai.  Discharged on 07/01/2021. Prior to this visit, she was treated with another course of prednisone after contacting the office on 07/16/2021 for suspected upper airway irritation and postinfectious cough. Reported starting to feel better.  Concerned for coming off prednisone given allergy season.  Discussed potential side effects and concern for recurrent pneumonia with continued prednisone use.  Continued on prednisone 20 mg for 5 days then decrease to 10 mg daily with plans to taper off further at follow-up.  Advised to continue supplemental oxygen with plans for walking oximetry at next visit.  Continue allergy treatment regimen.  Suspected that her cough was primarily upper airway irritation.  Plans to repeat chest x-ray in 1 month  08/27/2021: OV with Yoland Scherr NP for follow-up.  She reports feeling significantly better.  Looks much better than she did the last time I saw her. Significant improvement since being discharged and last seen in office.  Cough has resolved and shortness of breath has significantly improved.  Oxygen requirement has also improved-able to maintain saturations on room air in the 90s. Walking oximetry today with saturations in the 90s on room air.  Advised to monitor at home and notify if <88-90%.  CXR today showed improved patchy interstitial opacities throughout both lungs, likely scarring given clinical improvement. Continued on Trelegy and PRN albuterol. Singulair and OTC antihistamine for trigger prevention and flonase for postnasal drainage control. Good compliance on CPAP since discharge.  09/25/2021: OV with Dr. Lamonte Sakai. Doing well since discharge up until the last few weeks with increased dyspnea and hypoxemia, requiring O2 again. Feeling similar to when she was readmitted last time with fatigue,  weakness and intermittent confusion. No significant change in cough - dry. Questioned for possible adrenal insufficiency as she has been off prednisone  after being on it for most of February and March. Findings not consistent with CAP; CXR showed chronic b/l areas of scarring and fibrosis, unable to r/o superimposed infiltrate entirely. Labs unremarkable with nl cortisol and stable CBC.   10/03/2021: OV with Jasamine Pottinger NP for follow up. She reports feeling much better than she did last week. She did start to have a more productive cough with white/gray sputum production but she feels like this has actually improved her chest congestion. She also doesn't feel as short of breath as she did and her fatigue symptoms are much better. She was having some nausea last week as well, which she saw her PCP for and found to have elevated liver enzymes. Hepatitis panel was negative; given her symptoms and weight loss, he recommended referral to GI for further workup. She does feel like her appetite has improved and she is not nearly as nauseated as she was last week. She denies any hemoptysis, ongoing weight loss, lower extremity edema, fevers, night sweats, hematochezia or vomiting. She is currently on 3 lpm supplemental oxygen and her oxygen levels have been in the high 90's; we were able to decrease her to 2 lpm with activity and room air at rest. She does continue to wear CPAP nightly, which is managed by Dr. Isidoro Donning. She did recently get a new machine and sleeps well with it. Concern for underlying ILD or possible PAH; HRCT ordered and plan for repeat PFTs due to progressive DOE.  11/07/2021: Today - follow up Patient presents today for follow up after PFTs. She had a slight improvement in her lung function and 11% bronchodilator response. Her TLC and DLCO were decreased when compared to 4 years ago. Her recent HRCT showed a pulmonary parenchymal pattern of fibrosis, new from 2016, and not UIP. She also had bronchiectasis and some air trapping. Today, she reports feeling much better than when I saw her last. She looks the best I have seen her. She says that she feels like her  breathing gets better everyday. She is no longer wearing her oxygen, except bled through her CPAP, and states that her O2 levels have been 91% or above. Her previous cough has resolved. She will occasionally get a throat tickle and feel like she has to cough but this is rare. She denies any wheezing, fevers, night sweats, lower extremity edema. She continues on Trelegy and hasn't had to use her rescue since May. She uses astelin, flonase, over the counter antihistamine and singulair for allergies.  Allergies  Allergen Reactions   Crestor [Rosuvastatin Calcium]     Severe liver function problems   Hydrocodone-Acetaminophen Anxiety and Itching   Lidocaine Hcl Other (See Comments)    Novacaine:  Becomes very shaky   Lipitor [Atorvastatin Calcium] Other (See Comments)    Elevated liver function    Procaine Other (See Comments)    shaking   Rosuvastatin Other (See Comments)    Severe liver function problems   Theophylline Other (See Comments)    Becomes very shaky skaking skaking   Vicodin [Hydrocodone-Acetaminophen] Itching    Itching all over   Codeine Nausea Only, Anxiety and Other (See Comments)    Also complained of dizziness.  Has same problems with oxycodone and hydrocodone Visual Disturbance, Balance Difficulty, Dizzy "Room Spinning; "Swimming" Balance, vision, nausea, dizzy, "swimming", "room spinning" Balance, vision, nausea, dizzy, "swimming", "room  spinning"   Ephedrine Other (See Comments)    shaking   Other Other (See Comments)    Quinine causes nausea, dizzy   Oxycodone Other (See Comments)    Balance, vision, nausea, dizzy, "swimming", "room spinning"   Oxycodone-Acetaminophen Nausea Only, Anxiety and Other (See Comments)    Visual Disturbance, Balance Difficulty, Dizzy "Room Spinning; "Swimming"   Propoxyphene Anxiety, Nausea Only and Other (See Comments)    Visual Disturbance, Balance Difficulty, Dizzy "Room Spinning; "Swimming" Balance, vision, nausea, dizzy,  "swimming", "room spinning" Balance, vision, nausea, dizzy, "swimming, "room spinning"   Quinine Derivatives Nausea Only    dizzy   Sulfa Antibiotics Nausea Only    Also complained of dizziness   Epinephrine Other (See Comments)    Patient becomes "shaky" shaking shaking   Penicillins Rash    Pt reported all over body rash in 1958 Has patient had a PCN reaction causing immediate rash, facial/tongue/throat swelling, SOB or lightheadedness with hypotension:Yes Has patient had a PCN reaction causing severe rash involving mucus membranes or skin necrosis: No Has patient had a PCN reaction that required hospitalization: No Has patient had a PCN reaction occurring within the last 10 years:No     Quinine Other (See Comments)   Theophyllines Other (See Comments)    Patient becomes "shaky"    Immunization History  Administered Date(s) Administered   Fluad Quad(high Dose 65+) 03/30/2019, 01/21/2020, 04/24/2021   Influenza Split 04/06/2009, 04/05/2010, 01/10/2011, 02/10/2012, 04/23/2013   Influenza, High Dose Seasonal PF 03/20/2017, 01/19/2018   Influenza-Unspecified 05/08/2015, 01/23/2016   PFIZER(Purple Top)SARS-COV-2 Vaccination 06/11/2019, 07/06/2019, 02/28/2020   Pneumococcal Conjugate-13 06/01/2013, 03/20/2017   Pneumococcal Polysaccharide-23 03/16/2003, 01/23/2016   Td 12/08/2009   Zoster, Live 06/07/2015    Past Medical History:  Diagnosis Date   Allergy    Anxiety    Arthritis    no cartlidge in knees   Asthma    Bradycardia    a. atenolol stopped due to this, HR 40s.   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  CAD s/p RCA stent 2000 with residual mild-mod disease of left system 2001.   Chronic atrial fibrillation (HCC)    COPD (chronic obstructive pulmonary disease) ([place])    Essential hypertension 03/24/2015   Gout    post operative   Hx of radiation therapy 04/02/11 -05/20/11   left breast   Hyperlipidemia     Hypothyroidism 03/24/2015   Incontinence of urine    Malignant neoplasm of upper-outer quadrant of female breast ([place]) 12/26/2010   Osteoporosis 03/24/2015   Peripheral neuropathy 03/24/2015   Plantar fasciitis    Sleep apnea     Tobacco History: Social History   Tobacco Use  Smoking Status Former   Packs/day: 1.00   Years: 4.00   Total pack years: 4.00   Types: Cigarettes   Quit date: 12/05/1962   Years since quitting: 58.9  Smokeless Tobacco Never   Counseling given: Not Answered   Outpatient Medications Prior to Visit  Medication Sig Dispense Refill   albuterol (PROVENTIL) (2.5 MG/3ML) 0.083% nebulizer solution Take 3 mLs (2.5 mg total) by nebulization every 6 (six) hours as needed for wheezing or shortness of breath. 75 mL 12   ALLERGY RELIEF 180 MG tablet TAKE 1 TABLET BY MOUTH EVERY DAY 90 tablet 1   allopurinol (ZYLOPRIM) 300 MG tablet Take 300 mg by mouth daily.     ALPRAZolam (XANAX) 0.5 MG tablet TAKE  1 TO 2 TABLETS BY MOUTH TWICE DAILY AS NEEDED FOR ANXIETY OR SLEEP 60 tablet 2   Ascorbic Acid (VITAMIN C) 1000 MG tablet Take 1,000 mg by mouth in the morning.     atorvastatin (LIPITOR) 20 MG tablet Take 1 tablet (20 mg total) by mouth daily. 90 tablet 3   azelastine (ASTELIN) 0.1 % nasal spray Place 2 sprays into both nostrils daily. Use in each nostril as directed (Patient taking differently: Place 2 sprays into both nostrils in the morning.) 30 mL 5   B Complex Vitamins (VITAMIN B COMPLEX PO) Take 1 tablet by mouth daily.     Biotin 5000 MCG CAPS Take 5,000 mcg by mouth daily.      cholecalciferol (VITAMIN D) 1000 UNITS tablet Take 1,000 Units by mouth daily.     Choline Dihydrogen Citrate (CHOLINE CITRATE PO) Take 1 capsule by mouth in the morning.     co-enzyme Q-10 50 MG capsule Take 100 mg by mouth daily.     colchicine 0.6 MG tablet Take 0.6 mg by mouth daily as needed (flare  up).      Cyanocobalamin (VITAMIN B-12 PO) Take 1 tablet by mouth daily.      cycloSPORINE (RESTASIS) 0.05 % ophthalmic emulsion Apply 1 drop to eye 2 (two) times daily as needed (allergies).     dextromethorphan-guaiFENesin (MUCINEX DM) 30-600 MG 12hr tablet Take 1 tablet by mouth 2 (two) times daily.     EPINEPHrine 0.3 mg/0.3 mL IJ SOAJ injection Inject 0.3 mg into the muscle as needed for anaphylaxis.     escitalopram (LEXAPRO) 10 MG tablet TAKE 1 TABLET(10 MG) BY MOUTH DAILY 90 tablet 3   ezetimibe (ZETIA) 10 MG tablet Take 10 mg by mouth 2 (two) times a week. Take on Tues & Fri     fluticasone (FLONASE) 50 MCG/ACT nasal spray Place 1 spray into both nostrils every morning.     fluticasone-salmeterol (ADVAIR) 500-50 MCG/ACT AEPB Inhale 1 puff into the lungs in the morning and at bedtime.     gabapentin (NEURONTIN) 300 MG capsule Take 1 capsule (300 mg total) by mouth 3 (three) times daily. (Patient taking differently: Take 300 mg by mouth at bedtime.) 270 capsule 1   Homeopathic Products (ARNICARE) GEL Apply 1 application topically daily as needed (soreness).     hydrochlorothiazide (HYDRODIURIL) 25 MG tablet TAKE 2 TABLETS(50 MG) BY MOUTH DAILY 180 tablet 1   levalbuterol (XOPENEX HFA) 45 MCG/ACT inhaler Inhale 2 puffs into the lungs every 6 (six) hours as needed for wheezing or shortness of breath. 3 Inhaler 3   levocetirizine (XYZAL) 5 MG tablet Take 1 tablet (5 mg total) by mouth every evening. 30 tablet 11   levothyroxine (SYNTHROID) 75 MCG tablet Take 1 tablet (75 mcg total) by mouth daily before breakfast. 90 tablet 1   metoprolol succinate (TOPROL-XL) 25 MG 24 hr tablet TAKE 1/2 TABLET(12.5 MG) BY MOUTH DAILY 45 tablet 0   montelukast (SINGULAIR) 10 MG tablet Take 1 tablet (10 mg total) by mouth at bedtime. 90 tablet 1   nitrofurantoin (MACRODANTIN) 50 MG capsule Take 50 mg by mouth 3 (three) times a week. Take three days a week (MWF)     Omega-3 Fatty Acids (FISH OIL) 1000 MG CAPS Take 1 capsule by mouth daily.     ondansetron (ZOFRAN) 4 MG tablet Take 1 tablet  (4 mg total) by mouth every 8 (eight) hours as needed for nausea or vomiting. 40 tablet 3   potassium  chloride SA (KLOR-CON M) 20 MEQ tablet TAKE 1 TABLET(20 MEQ) BY MOUTH DAILY 90 tablet 3   Pyridoxine HCl (VITAMIN B-6 PO) Take 1 tablet by mouth daily.     REPATHA PUSHTRONEX SYSTEM 420 MG/3.5ML SOCT INJECT 1 UNIT INTO THE SKIN EVERY 30 DAYS 3.5 mL 11   TART CHERRY PO Take 1 capsule by mouth in the morning and at bedtime.     VITAMIN E PO Take 1 tablet by mouth daily.     XARELTO 20 MG TABS tablet TAKE 1 TABLET(20 MG) BY MOUTH DAILY WITH SUPPER 90 tablet 1   Zinc 100 MG TABS Take 1 tablet by mouth in the morning.     No facility-administered medications prior to visit.     Review of Systems:   Constitutional: No night sweats, fevers, chills, fatigue, or lassitude. +weight loss (working with PCP)  HEENT: No headaches, difficulty swallowing, tooth/dental problems, or sore throat. No sneezing, itching, ear ache, nasal congestion, or post nasal drip CV:  No chest pain, orthopnea, PND, swelling in lower extremities, anasarca, dizziness, palpitations, syncope Resp: +shortness of breath with exertion (minimal); occasional dry cough/throat tickle. No excess mucus or change in color of mucus. No productive or non-productive. No hemoptysis. No wheezing.  No chest wall deformity GI:  No heartburn, indigestion, abdominal pain, nausea, vomiting, diarrhea, change in bowel habits, loss of appetite, bloody stools.  Skin: No rash, lesions, ulcerations MSK:  No joint pain or swelling.  No decreased range of motion.  No back pain. Neuro: No dizziness or lightheadedness.  Psych: No depression or anxiety. Mood stable.     Physical Exam:  BP 104/60 (BP Location: Right Arm, Patient Position: Sitting, Cuff Size: Large)   Pulse 88   Temp 98.5 F (36.9 C) (Oral)   Ht 5\' 6"  (1.676 m)   Wt 233 lb 6.4 oz (105.9 kg)   SpO2 93%   BMI 37.67 kg/m   GEN: Pleasant, interactive, well-appearing; elderly; obese; in  no acute distress. HEENT:  Normocephalic and atraumatic. PERRLA. Sclera white. Nasal turbinates pink, moist and patent bilaterally. No rhinorrhea present. Oropharynx pink and moist, without exudate or edema. No lesions, ulcerations, or postnasal drip.  NECK:  Supple w/ fair ROM. No JVD present. Normal carotid impulses w/o bruits. Thyroid symmetrical with no goiter or nodules palpated. No lymphadenopathy.   CV: RRR, no m/r/g, no peripheral edema. Pulses intact, +2 bilaterally. No cyanosis, pallor or clubbing. PULMONARY:  Unlabored, regular breathing. Clear bilaterally A&P w/o wheezes/rales/rhonchi. No accessory muscle use. No dullness to percussion. GI: BS present and normoactive. Soft, non-tender to palpation.  Neuro: A/Ox3. No focal deficits noted.   Skin: Warm, no lesions or rashe Psych: Normal affect and behavior. Judgement and thought content appropriate.     Lab Results:  CBC    Component Value Date/Time   WBC 7.9 09/28/2021 0939   RBC 3.57 (L) 09/28/2021 0939   HGB 11.5 (L) 09/28/2021 0939   HGB 13.6 06/27/2016 0839   HCT 35.1 (L) 09/28/2021 0939   HCT 40.6 06/27/2016 0839   PLT 167.0 09/28/2021 0939   PLT 146 06/27/2016 0839   MCV 98.5 09/28/2021 0939   MCV 98.1 06/27/2016 0839   MCH 31.7 06/30/2021 0732   MCHC 32.7 09/28/2021 0939   RDW 17.2 (H) 09/28/2021 0939   RDW 15.0 (H) 06/27/2016 0839   LYMPHSABS 0.5 (L) 09/28/2021 0939   LYMPHSABS 1.5 06/27/2016 0839   MONOABS 0.7 09/28/2021 0939   MONOABS 0.8 06/27/2016 06/29/2016  EOSABS 0.1 09/28/2021 0939   EOSABS 0.3 06/27/2016 0839   BASOSABS 0.0 09/28/2021 0939   BASOSABS 0.1 06/27/2016 0839    BMET    Component Value Date/Time   NA 137 09/28/2021 0939   NA 147 (H) 06/06/2021 1607   NA 143 06/27/2016 0839   K 3.5 09/28/2021 0939   K 4.0 06/27/2016 0839   CL 99 09/28/2021 0939   CL 105 07/15/2012 1409   CO2 31 09/28/2021 0939   CO2 28 06/27/2016 0839   GLUCOSE 130 (H) 09/28/2021 0939   GLUCOSE 109 06/27/2016 0839    GLUCOSE 117 (H) 07/15/2012 1409   BUN 23 09/28/2021 0939   BUN 14 06/06/2021 1607   BUN 17.3 06/27/2016 0839   CREATININE 1.09 09/28/2021 0939   CREATININE 0.83 11/24/2019 0958   CREATININE 0.8 06/27/2016 0839   CALCIUM 9.7 09/28/2021 0939   CALCIUM 9.7 06/27/2016 0839   GFRNONAA >60 06/30/2021 0508   GFRNONAA 66 11/24/2019 0958   GFRAA 66 12/28/2019 1549   GFRAA 76 11/24/2019 0958    BNP    Component Value Date/Time   BNP 103.8 (H) 06/26/2021 1327   BNP 72 11/15/2019 1304     Imaging:  CT Abdomen Pelvis W Contrast  Result Date: 11/05/2021 CLINICAL DATA:  Nausea vomiting. Unexplained weight loss of 100 lbs since December, nausea/vomitting Sx-cholecystectomy Hx of Lt breast cancer, XRT, lumpectomy EXAM: CT ABDOMEN AND PELVIS WITH CONTRAST TECHNIQUE: Multidetector CT imaging of the abdomen and pelvis was performed using the standard protocol following bolus administration of intravenous contrast. RADIATION DOSE REDUCTION: This exam was performed according to the departmental dose-optimization program which includes automated exposure control, adjustment of the mA and/or kV according to patient size and/or use of iterative reconstruction technique. CONTRAST:  172mL ISOVUE-300 IOPAMIDOL (ISOVUE-300) INJECTION 61% COMPARISON:  None Available. FINDINGS: Lower chest: Persistent bilateral lower lobes patchy interlobular septal wall thickening ground-glass airspace opacities. Cardiomegaly. Coronary artery calcification. Tiny hiatal hernia. Hepatobiliary: Nodular hepatic contour. No focal liver abnormality. Status post cholecystectomy. No biliary dilatation. Pancreas: No focal lesion. Normal pancreatic contour. No surrounding inflammatory changes. No main pancreatic ductal dilatation. Spleen: Normal in size without focal abnormality. Adrenals/Urinary Tract: No adrenal nodule bilaterally. Bilateral kidneys enhance symmetrically. Fluid density parapelvic lesion within left kidney likely represents a  simple renal cyst measuring up to 6.9 cm. Simple renal cysts, in the absence of clinically indicated signs/symptoms, require no independent follow-up. Left nephrolithiasis measuring up to 0.8 cm. No right nephrolithiasis. No hydronephrosis. No hydroureter. The urinary bladder is unremarkable. On delayed imaging, there is no urothelial wall thickening and there are no filling defects in the opacified portions of the bilateral collecting systems or ureters. Stomach/Bowel: Stomach is within normal limits. No evidence of bowel wall thickening or dilatation. Scattered colonic diverticulosis. The appendix is not definitely identified with no inflammatory changes in the right lower quadrant to suggest acute appendicitis. Vascular/Lymphatic: The main portal, splenic, superior mesenteric veins are patent. No abdominal aorta or iliac aneurysm. At least moderate to severe atherosclerotic plaque of the aorta and its branches. No abdominal, pelvic, or inguinal lymphadenopathy. Reproductive: Uterus and bilateral adnexa are unremarkable. Other: No intraperitoneal free fluid. No intraperitoneal free gas. No organized fluid collection. Musculoskeletal: No abdominal wall hernia or abnormality. Calcified granuloma within the left gluteal soft tissues. No suspicious lytic or blastic osseous lesions. No acute displaced fracture. Multilevel degenerative changes of the spine. IMPRESSION: 1. Cirrhosis with no portal hypertension. No focal liver lesions identified . Please note that  liver protocol enhanced MR and CT are the most sensitive tests for the screening detection of hepatocellular carcinoma in the high risk setting of cirrhosis. 2.  Aortic Atherosclerosis (ICD10-I70.0). Electronically Signed   By: Iven Finn M.D.   On: 11/05/2021 20:08         Latest Ref Rng & Units 11/07/2021    9:53 AM 02/06/2017   10:49 AM  PFT Results  FVC-Pre L 1.95  P 2.19   FVC-Predicted Pre % 72  P 74   FVC-Post L 2.09  P 2.15   FVC-Predicted  Post % 78  P 73   Pre FEV1/FVC % % 84  P 76   Post FEV1/FCV % % 88  P 81   FEV1-Pre L 1.64  P 1.67   FEV1-Predicted Pre % 82  P 75   FEV1-Post L 1.84  P 1.75   DLCO uncorrected ml/min/mmHg 11.82  P 20.72   DLCO UNC% % 59  P 75   DLCO corrected ml/min/mmHg 12.62  P 19.57   DLCO COR %Predicted % 63  P 70   DLVA Predicted % 75  P 98   TLC L 3.57  P 4.55   TLC % Predicted % 66  P 83   RV % Predicted % 54  P 85     P Preliminary result    No results found for: "NITRICOXIDE"      Assessment & Plan:   ILD (interstitial lung disease) ([place]) She had progressive DOE and slow to resolve hypoxia after her hospitalization. She also had some ground glass changes on CT from February. We obtained a HRCT to evaluate for ILD. 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 Her PFTs showed a restrictive defect with FVC 72, TLC 66, and DLCOcor 63. We discussed possible next steps and given her improvement over the past month, she would like to avoid further testing and continue with surveillance so we will plan to repeat imaging and spiro/DLCO in 6 months.   Patient Instructions  Continue Trelegy 1 puff daily. Brush tongue and rinse mouth afterwards  Continue Albuterol inhaler 2 puffs or 3 mL neb every 6 hours as needed for shortness of breath or wheezing. Notify if symptoms persist despite rescue inhaler/neb use. Continue astelin nasal spray 2 sprays each nostril Twice daily as needed for nasal congestion/drainage Continue flonase nasal spray 1 spray each nostril daily Continue daily over the counter allergy medicine daily  Continue singulair 10 mg At bedtime    Continue CPAP at night with oxygen bled through, minimum of 4-6 hours  You can use mucinex 600 mg Twice daily as needed for chest congestion/cough Flutter valve 2-3 times a day for as needed for chest congestion/cough    Follow up in 3 months with Dr. Lamonte Sakai.  If symptoms do not improve or worsen, please contact office for sooner follow up or seek emergency care.    Asthma Compensated on current regimen. PFTs with mild obstruction in FEV1 with reversibility. Continue triple therapy regimen with Trelegy and PRN albuterol. Continue singulair for trigger prevention.   Chronic respiratory failure with hypoxia (HCC) Resolved hypoxia with ambulation. She was able to walk 3 laps today on room air with lowest SpO2 91%. Advised she continue with nocturnal O2 bled through her CPAP. Monitor O2 levels at home for goal >88-90%  OSA on CPAP Excellent compliance. Followed by Dr. Maxwell Caul.   Allergic rhinitis Well controlled.  Continue astelin, flonase and otc antihistamine.   Pulmonary hypertension ([place]) Enlarged pulmonary artery on recent HRCT and moderately elevated PASP on echo from 06/2021. Prefers to hold off on further testing at this point. She appears euvolemic on exam today, and dyspnea and hypoxia have significantly improved so agree with this plan. We will monitor her respiratory symptoms/activity tolerance. Follow up with cardiology as scheduled.   Bronchiectasis without complication (HCC) BTX on HRCT. Not currently having any cough or chest congestion. Advised on role of mucociliary clearance therapies in BTX. Recommended she use mucinex and flutter valve if she develops cough, chest congestion and/or increased SOB.     I spent 32 minutes of dedicated to the care of this patient on the date of this encounter to include pre-visit review of records, face-to-face time with the patient discussing conditions above, post visit ordering of testing, clinical documentation with the electronic health record, making appropriate referrals as documented, and communicating necessary findings to members of the patients care team.  Clayton Bibles, NP 11/07/2021  Pt aware and understands NP's role.

## 2021-11-07 NOTE — Patient Instructions (Signed)
Full PFT Performed Today  

## 2021-11-07 NOTE — Addendum Note (Signed)
Addended by: Fran Lowes on: 11/07/2021 01:47 PM   Modules accepted: Orders

## 2021-11-07 NOTE — Patient Instructions (Addendum)
Continue Trelegy 1 puff daily. Brush tongue and rinse mouth afterwards  Continue Albuterol inhaler 2 puffs or 3 mL neb every 6 hours as needed for shortness of breath or wheezing. Notify if symptoms persist despite rescue inhaler/neb use. Continue astelin nasal spray 2 sprays each nostril Twice daily as needed for nasal congestion/drainage Continue flonase nasal spray 1 spray each nostril daily Continue daily over the counter allergy medicine daily  Continue singulair 10 mg At bedtime    Continue CPAP at night with oxygen bled through, minimum of 4-6 hours  You can use mucinex 600 mg Twice daily as needed for chest congestion/cough Flutter valve 2-3 times a day for as needed for chest congestion/cough    Follow up in 3 months with Dr. Lamonte Sakai. If symptoms do not improve or worsen, please contact office for sooner follow up or seek emergency care.

## 2021-11-07 NOTE — Assessment & Plan Note (Signed)
Compensated on current regimen. PFTs with mild obstruction in FEV1 with reversibility. Continue triple therapy regimen with Trelegy and PRN albuterol. Continue singulair for trigger prevention.

## 2021-11-07 NOTE — Assessment & Plan Note (Signed)
Well controlled. Continue astelin, flonase and otc antihistamine.

## 2021-11-09 ENCOUNTER — Other Ambulatory Visit: Payer: Self-pay | Admitting: Internal Medicine

## 2021-11-09 DIAGNOSIS — R112 Nausea with vomiting, unspecified: Secondary | ICD-10-CM

## 2021-11-09 DIAGNOSIS — R7989 Other specified abnormal findings of blood chemistry: Secondary | ICD-10-CM

## 2021-11-12 ENCOUNTER — Other Ambulatory Visit (INDEPENDENT_AMBULATORY_CARE_PROVIDER_SITE_OTHER): Payer: Medicare Other

## 2021-11-12 ENCOUNTER — Ambulatory Visit (INDEPENDENT_AMBULATORY_CARE_PROVIDER_SITE_OTHER): Payer: Medicare Other | Admitting: Gastroenterology

## 2021-11-12 VITALS — BP 122/72 | HR 75 | Ht 66.0 in | Wt 234.0 lb

## 2021-11-12 DIAGNOSIS — R634 Abnormal weight loss: Secondary | ICD-10-CM

## 2021-11-12 DIAGNOSIS — K7460 Unspecified cirrhosis of liver: Secondary | ICD-10-CM | POA: Diagnosis not present

## 2021-11-12 LAB — IBC + FERRITIN
Ferritin: 192.7 ng/mL (ref 10.0–291.0)
Iron: 107 ug/dL (ref 42–145)
Saturation Ratios: 31.2 % (ref 20.0–50.0)
TIBC: 343 ug/dL (ref 250.0–450.0)
Transferrin: 245 mg/dL (ref 212.0–360.0)

## 2021-11-12 LAB — COMPREHENSIVE METABOLIC PANEL
ALT: 207 U/L — ABNORMAL HIGH (ref 0–35)
AST: 395 U/L — ABNORMAL HIGH (ref 0–37)
Albumin: 3.9 g/dL (ref 3.5–5.2)
Alkaline Phosphatase: 108 U/L (ref 39–117)
BUN: 23 mg/dL (ref 6–23)
CO2: 25 mEq/L (ref 19–32)
Calcium: 9.8 mg/dL (ref 8.4–10.5)
Chloride: 105 mEq/L (ref 96–112)
Creatinine, Ser: 1.24 mg/dL — ABNORMAL HIGH (ref 0.40–1.20)
GFR: 40.05 mL/min — ABNORMAL LOW (ref >60.00)
Glucose, Bld: 109 mg/dL — ABNORMAL HIGH (ref 70–99)
Potassium: 3.2 mEq/L — ABNORMAL LOW (ref 3.5–5.1)
Sodium: 140 mEq/L (ref 135–145)
Total Bilirubin: 1.7 mg/dL — ABNORMAL HIGH (ref 0.2–1.2)
Total Protein: 8.1 g/dL (ref 6.0–8.3)

## 2021-11-12 LAB — CBC WITH DIFFERENTIAL/PLATELET
Basophils Absolute: 0 10*3/uL (ref 0.0–0.1)
Basophils Relative: 0.8 % (ref 0.0–3.0)
Eosinophils Absolute: 0.3 10*3/uL (ref 0.0–0.7)
Eosinophils Relative: 6 % — ABNORMAL HIGH (ref 0.0–5.0)
HCT: 38.8 % (ref 36.0–46.0)
Hemoglobin: 12.7 g/dL (ref 12.0–15.0)
Lymphocytes Relative: 14.2 % (ref 12.0–46.0)
Lymphs Abs: 0.7 10*3/uL (ref 0.7–4.0)
MCHC: 32.7 g/dL (ref 30.0–36.0)
MCV: 100.8 fl — ABNORMAL HIGH (ref 78.0–100.0)
Monocytes Absolute: 0.6 10*3/uL (ref 0.1–1.0)
Monocytes Relative: 12.3 % — ABNORMAL HIGH (ref 3.0–12.0)
Neutro Abs: 3.2 10*3/uL (ref 1.4–7.7)
Neutrophils Relative %: 66.7 % (ref 43.0–77.0)
Platelets: 143 10*3/uL — ABNORMAL LOW (ref 150.0–400.0)
RBC: 3.85 Mil/uL — ABNORMAL LOW (ref 3.87–5.11)
RDW: 16.4 % — ABNORMAL HIGH (ref 11.5–15.5)
WBC: 4.8 10*3/uL (ref 4.0–10.5)

## 2021-11-12 LAB — PROTIME-INR
INR: 2.8 ratio — ABNORMAL HIGH (ref 0.8–1.0)
Prothrombin Time: 28.8 s — ABNORMAL HIGH (ref 9.6–13.1)

## 2021-11-12 MED ORDER — PANTOPRAZOLE SODIUM 40 MG PO TBEC: 40.0000 mg | DELAYED_RELEASE_TABLET | Freq: Every day | ORAL | 3 refills | Status: DC

## 2021-11-12 NOTE — Patient Instructions (Addendum)
If you are age 84 or older, your body mass index should be between 23-30. Your Body mass index is 37.77 kg/m. If this is out of the aforementioned range listed, please consider follow up with your Primary Care Provider.  If you are age 1 or younger, your body mass index should be between 19-25. Your Body mass index is 37.77 kg/m. If this is out of the aformentioned range listed, please consider follow up with your Primary Care Provider.   ________________________________________________________  The [facility] providers would like to encourage you to use [facility] to communicate with providers for non-urgent requests or questions.  Due to long hold times on the telephone, sending your provider a message by [facility] may be a faster and more efficient way to get a response.  Please allow 48 business hours for a response.  Please remember that this is for non-urgent requests.  _______________________________________________________  Your provider has requested that you go to the basement level for lab work before leaving today. Press "B" on the elevator. The lab is located at the first door on the left as you exit the elevator.  We have sent the following medications to your pharmacy for you to pick up at your convenience: Protonix  Continue dulcolax as needed  Next appointment is 12-26-2021 at 8:30 am. Please arrive 15 minutes prior to appointment  Please call with any questions or concerns.  Thank you,  Dr. Jackquline Denmark

## 2021-11-12 NOTE — Progress Notes (Signed)
Chief Complaint: wt loss  Referring Provider:  Biagio Borg, MD      ASSESSMENT AND PLAN;   #1. 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 She does have pulm HTN without overt RHF.  #2. Wt loss. Neg CT AP for etiology. Most likely related to recent loss of her husband.  Plan: -CBC, CMP, PT, AFP, ASMA, AMA, celiac, iron studies, serum ceruloplasmin, A1AT, -protonix 26m po QD #30, 3 RF -Consider remeron if continued weight loss. -Continue dulcolax prn -FU in 6-8 weeks -If still with problems or further weight loss, would consider further work-up - EGD/colon/MRI liver   I called JAlmyra Free-patient's daughter to discuss and have left a detailed message.  I did not have a phone number for JArville Go(Investment banker, corporate   HPI:    Mercedes APPELHANSis a 86y.o. female  With H/O asthma, COPD on home O2 (nightly),  OSA on CPAP (followed by Dr. OElenore Rota, CAD, CHF (Nl EF 60-65%), hypertension, sinus node dysfunction, A-fib on Xarelto, hypothyroid, peripheral neuropathy, osteoarthritis, stage Ia left breast cancer 2012, HLD, obesity, gout, prediabetes.  With wt loss of 100lb over 6 months No appetite Lost her husband Apr 28, 2021 unexpectedly d/t heart attack (has been married for over 65 years)  No nausea, vomiting, heartburn, regurgitation, odynophagia or dysphagia (had in past, ba swallow showed esophageal dysmotility).  No significant diarrhea.  No melena or hematochezia. No unintentional weight loss. No abdominal pain.  Constipation  -long standing -1/2-3 days -Better with Dulcolax.  She underwent CT Abdo/pelvis with contrast 11/05/2021 which showed incidental liver cirrhosis without portal hypertension.  No previous history of EtOH.  She does have easy bruisability ever since she has been on Xarelto. No history suggestive of hepatic encephalopathy.  LFTs reviewed     Latest Ref Rng & Units 09/28/2021    9:39 AM 09/26/2021   11:55 AM 06/27/2021    4:06 AM  Hepatic  Function  Total Protein 6.0 - 8.3 g/dL 7.4  6.7  6.7   Albumin 3.5 - 5.2 g/dL 3.3  3.2  3.2   AST 0 - 37 U/L 68  69  24   ALT 0 - 35 U/L 43  44  15   Alk Phosphatase 39 - 117 U/L 96  105  48   Total Bilirubin 0.2 - 1.2 mg/dL 2.1  1.7  1.1   Bilirubin, Direct 0.0 - 0.3 mg/dL 0.5  0.4        Previous GI work-up:  Never had EGD/colonoscopy.  Does not want at this time  CT AP 11/05/2021 1. Cirrhosis with no portal hypertension. No focal liver lesions identified . Please note that liver protocol enhanced MR and CT are the most sensitive tests for the screening detection of hepatocellular carcinoma in the high risk setting of cirrhosis. 2.  Aortic Atherosclerosis (ICD10-I70.0). 3.  Small hiatal hernia.  Ba swallow 06/29/2021 IMPRESSION: 1. Limited mobility exam. 2. No esophageal stricture or mass.  No obstruction. 3. Mild esophageal dysmotility.  2DE 06/2021: EF 60 to 65%.  LVH.  Unable to determine diastolic function.  Moderately elevated PASP  Past Medical History:  Diagnosis Date   Allergy    Anxiety    Arthritis    no cartlidge in knees   Asthma    Bradycardia    a. atenolol stopped due to this, HR 40s.   Breast cancer ([place] + her2 _ 12/26/2010   left  CAD in native artery    a.  CAD s/p RCA stent 2000 with residual mild-mod disease of left system 2001.   Chronic atrial fibrillation (HCC)    COPD (chronic obstructive pulmonary disease) ([place])    Essential hypertension 03/24/2015   Gout    post operative   Hx of radiation therapy 04/02/11 -05/20/11   left breast   Hyperlipidemia    Hypothyroidism 03/24/2015   Incontinence of urine    Malignant neoplasm of upper-outer quadrant of female breast ([place]) 12/26/2010   Osteoporosis 03/24/2015   Peripheral neuropathy 03/24/2015   Plantar fasciitis    Sleep apnea     Past Surgical History:  Procedure Laterality Date   BREAST LUMPECTOMY W/ NEEDLE LOCALIZATION  01/29/2011   Left with [facility] Dr Margot Chimes    CHOLECYSTECTOMY  1955   CORONARY ANGIOPLASTY WITH STENT PLACEMENT  2000   DILATION AND CURETTAGE OF UTERUS  1976   and [place]   tendons between thumb and forefinger   SKIN BIOPSY     1994, 2006, 2008, 2010, 2011, 2011, 2012-  pre-cancerous   TONSILLECTOMY  1955    Family History  Problem Relation Age of Onset   Heart disease Father    Heart attack Father    Rectal cancer Father    Asthma Sister    Heart disease Brother    Heart attack Brother    Stomach cancer Neg Hx    Colon cancer Neg Hx     Social History   Tobacco Use   Smoking status: Former    Packs/day: 1.00    Years: 4.00    Total pack years: 4.00    Types: Cigarettes    Quit date: 12/05/1962    Years since quitting: 58.9   Smokeless tobacco: Never  Vaping Use   Vaping Use: Never used  Substance Use Topics   Alcohol use: No   Drug use: No    Current Outpatient Medications  Medication Sig Dispense Refill   ALLERGY RELIEF 180 MG tablet TAKE 1 TABLET BY MOUTH EVERY DAY 90 tablet 1   allopurinol (ZYLOPRIM) 300 MG tablet Take 300 mg by mouth daily.     ALPRAZolam (XANAX) 0.5 MG tablet TAKE 1 TO 2 TABLETS BY MOUTH TWICE DAILY AS NEEDED FOR ANXIETY OR SLEEP 60 tablet 2   Ascorbic Acid (VITAMIN C) 1000 MG tablet Take 1,000 mg by mouth in the morning.     atorvastatin (LIPITOR) 20 MG tablet Take 1 tablet (20 mg total) by mouth daily. 90 tablet 3   azelastine (ASTELIN) 0.1 % nasal spray Place 2 sprays into both nostrils daily. Use in each nostril as directed (Patient taking differently: Place 2 sprays into both nostrils in the morning.) 30 mL 5   B Complex Vitamins (VITAMIN B COMPLEX PO) Take 1 tablet by mouth daily.     Biotin 5000 MCG CAPS Take 5,000 mcg by mouth daily.      cholecalciferol (VITAMIN D) 1000 UNITS tablet Take 1,000 Units by mouth daily.     Choline Dihydrogen Citrate (CHOLINE CITRATE PO) Take 1 capsule by mouth in the morning.     co-enzyme Q-10 50 MG capsule Take 100 mg by mouth  daily.     colchicine 0.6 MG tablet Take 0.6 mg by mouth daily as needed (flare  up).      Cyanocobalamin (VITAMIN B-12 PO) Take 1 tablet by mouth daily.     cycloSPORINE (RESTASIS) 0.05 % ophthalmic emulsion  Apply 1 drop to eye 2 (two) times daily as needed (allergies).     dextromethorphan-guaiFENesin (MUCINEX DM) 30-600 MG 12hr tablet Take 1 tablet by mouth 2 (two) times daily.     EPINEPHrine 0.3 mg/0.3 mL IJ SOAJ injection Inject 0.3 mg into the muscle as needed for anaphylaxis.     escitalopram (LEXAPRO) 10 MG tablet TAKE 1 TABLET(10 MG) BY MOUTH DAILY 90 tablet 3   ezetimibe (ZETIA) 10 MG tablet Take 10 mg by mouth 2 (two) times a week. Take on Tues & Fri     fluticasone (FLONASE) 50 MCG/ACT nasal spray Place 1 spray into both nostrils every morning.     fluticasone-salmeterol (ADVAIR) 500-50 MCG/ACT AEPB Inhale 1 puff into the lungs in the morning and at bedtime.     gabapentin (NEURONTIN) 300 MG capsule Take 1 capsule (300 mg total) by mouth 3 (three) times daily. (Patient taking differently: Take 300 mg by mouth at bedtime.) 270 capsule 1   Homeopathic Products (ARNICARE) GEL Apply 1 application topically daily as needed (soreness).     hydrochlorothiazide (HYDRODIURIL) 25 MG tablet TAKE 2 TABLETS(50 MG) BY MOUTH DAILY 180 tablet 1   levalbuterol (XOPENEX HFA) 45 MCG/ACT inhaler Inhale 2 puffs into the lungs every 6 (six) hours as needed for wheezing or shortness of breath. 3 Inhaler 3   levothyroxine (SYNTHROID) 75 MCG tablet Take 1 tablet (75 mcg total) by mouth daily before breakfast. 90 tablet 1   metoprolol succinate (TOPROL-XL) 25 MG 24 hr tablet TAKE 1/2 TABLET(12.5 MG) BY MOUTH DAILY 45 tablet 0   montelukast (SINGULAIR) 10 MG tablet Take 1 tablet (10 mg total) by mouth at bedtime. 90 tablet 1   nitrofurantoin (MACRODANTIN) 50 MG capsule Take 50 mg by mouth 3 (three) times a week. Take three days a week (MWF)     Omega-3 Fatty Acids (FISH OIL) 1000 MG CAPS Take 1 capsule by mouth  daily.     ondansetron (ZOFRAN) 4 MG tablet Take 1 tablet (4 mg total) by mouth every 8 (eight) hours as needed for nausea or vomiting. 40 tablet 3   potassium chloride SA (KLOR-CON M) 20 MEQ tablet TAKE 1 TABLET(20 MEQ) BY MOUTH DAILY 90 tablet 3   Pyridoxine HCl (VITAMIN B-6 PO) Take 1 tablet by mouth daily.     [place] 420 MG/3.5ML SOCT INJECT 1 UNIT INTO THE SKIN EVERY 30 DAYS 3.5 mL 11   TART CHERRY PO Take 1 capsule by mouth in the morning and at bedtime.     VITAMIN E PO Take 1 tablet by mouth daily.     XARELTO 20 MG TABS tablet TAKE 1 TABLET(20 MG) BY MOUTH DAILY WITH SUPPER 90 tablet 1   Zinc 100 MG TABS Take 1 tablet by mouth in the morning.     albuterol (PROVENTIL) (2.5 MG/3ML) 0.083% nebulizer solution Take 3 mLs (2.5 mg total) by nebulization every 6 (six) hours as needed for wheezing or shortness of breath. (Patient not taking: Reported on 11/12/2021) 75 mL 12   levocetirizine (XYZAL) 5 MG tablet Take 1 tablet (5 mg total) by mouth every evening. (Patient not taking: Reported on 11/12/2021) 30 tablet 11   No current facility-administered medications for this visit.    Allergies  Allergen Reactions   Crestor [Rosuvastatin Calcium]     Severe liver function problems   Hydrocodone-Acetaminophen Anxiety and Itching   Lidocaine Hcl Other (See Comments)    Novacaine:  Becomes very shaky   Lipitor [  Atorvastatin Calcium] Other (See Comments)    Elevated liver function    Procaine Other (See Comments)    shaking   Rosuvastatin Other (See Comments)    Severe liver function problems   Theophylline Other (See Comments)    Becomes very shaky skaking skaking   Vicodin [Hydrocodone-Acetaminophen] Itching    Itching all over   Codeine Nausea Only, Anxiety and Other (See Comments)    Also complained of dizziness.  Has same problems with oxycodone and hydrocodone Visual Disturbance, Balance Difficulty, Dizzy "Room Spinning; "Swimming" Balance, vision, nausea, dizzy,  "swimming", "room spinning" Balance, vision, nausea, dizzy, "swimming", "room spinning"   Ephedrine Other (See Comments)    shaking   Other Other (See Comments)    Quinine causes nausea, dizzy   Oxycodone Other (See Comments)    Balance, vision, nausea, dizzy, "swimming", "room spinning"   Oxycodone-Acetaminophen Nausea Only, Anxiety and Other (See Comments)    Visual Disturbance, Balance Difficulty, Dizzy "Room Spinning; "Swimming"   Propoxyphene Anxiety, Nausea Only and Other (See Comments)    Visual Disturbance, Balance Difficulty, Dizzy "Room Spinning; "Swimming" Balance, vision, nausea, dizzy, "swimming", "room spinning" Balance, vision, nausea, dizzy, "swimming, "room spinning"   Quinine Derivatives Nausea Only    dizzy   Sulfa Antibiotics Nausea Only    Also complained of dizziness   Epinephrine Other (See Comments)    Patient becomes "shaky" shaking shaking   Penicillins Rash    Pt reported all over body rash in 1958 Has patient had a PCN reaction causing immediate rash, facial/tongue/throat swelling, SOB or lightheadedness with hypotension:Yes Has patient had a PCN reaction causing severe rash involving mucus membranes or skin necrosis: No Has patient had a PCN reaction that required hospitalization: No Has patient had a PCN reaction occurring within the last 10 years:No     Quinine Other (See Comments)   Theophyllines Other (See Comments)    Patient becomes "shaky"    Review of Systems:  Constitutional: Denies fever, chills, diaphoresis, appetite change and fatigue.  HEENT: Denies photophobia, eye pain, redness, hearing loss, ear pain, congestion, sore throat, rhinorrhea, sneezing, mouth sores, neck pain, neck stiffness and tinnitus.   Respiratory: Denies SOB, DOE, cough, chest tightness,  and occ wheezing.   Cardiovascular: Denies chest pain, palpitations and leg swelling.  Genitourinary: Denies dysuria, urgency, frequency, hematuria, flank pain and difficulty  urinating.  Musculoskeletal: has myalgias, back pain, joint swelling, arthralgias and gait problem.  Skin: No rash.  Neurological: Denies dizziness, seizures, syncope, weakness, light-headedness, numbness and headaches.  Hematological: Denies adenopathy. Easy bruising, personal or family bleeding history  Psychiatric/Behavioral:Has  anxiety or depression     Physical Exam:    BP 122/72   Pulse 75   Ht _0  (1.676 m)   Wt 234 lb (106.1 kg)   SpO2 94%   BMI 37.77 kg/m  Wt Readings from Last 3 Encounters:  11/12/21 234 lb (106.1 kg)  11/07/21 233 lb 6.4 oz (105.9 kg)  11/02/21 228 lb (103.4 kg)   Constitutional:  Well-developed, in no acute distress. Psychiatric: Normal mood and affect. Behavior is normal. HEENT: Pupils normal.  Conjunctivae are normal. No scleral icterus. Neck supple.  Cardiovascular: Normal rate, regular rhythm. No edema Pulmonary/chest: Effort normal and breath sounds normal. No wheezing, rales or rhonchi. Abdominal: Soft, nondistended. Nontender. Bowel sounds active throughout. There are no masses palpable. No hepatomegaly. Rectal: Deferred Neurological: Alert and oriented to person place and time. Skin: Skin is warm and dry. No rashes noted.  Data  Reviewed: I have personally reviewed following labs and imaging studies  CBC:    Latest Ref Rng & Units 09/28/2021    9:39 AM 09/26/2021   11:55 AM 06/30/2021    7:32 AM  CBC  WBC 4.0 - 10.5 K/uL 7.9  7.6  7.6   Hemoglobin 12.0 - 15.0 g/dL 11.5  11.4  11.6   Hematocrit 36.0 - 46.0 % 35.1  34.8  36.0   Platelets 150.0 - 400.0 K/uL 167.0  168.0  203     CMP:    Latest Ref Rng & Units 09/28/2021    9:39 AM 09/26/2021   11:55 AM 06/30/2021    5:08 AM  CMP  Glucose 70 - 99 mg/dL 130  120  197   BUN 6 - 23 mg/dL _0 Creatinine 0.40 - 1.20 mg/dL 1.09  1.13  0.83   Sodium 135 - 145 mEq/L 137  139  133   Potassium 3.5 - 5.1 mEq/L 3.5  3.3  3.5   Chloride 96 - 112 mEq/L 99  100  96   CO2 19 - 32  mEq/L 31  32  27   Calcium 8.4 - 10.5 mg/dL 9.7  9.2  9.0   Total Protein 6.0 - 8.3 g/dL 7.4  6.7    Total Bilirubin 0.2 - 1.2 mg/dL 2.1  1.7    Alkaline Phos 39 - 117 U/L 96  105    AST 0 - 37 U/L 68  69    ALT 0 - 35 U/L 43  44        Latest Ref Rng & Units 09/28/2021    9:39 AM 09/26/2021   11:55 AM 06/27/2021    4:06 AM  Hepatic Function  Total Protein 6.0 - 8.3 g/dL 7.4  6.7  6.7   Albumin 3.5 - 5.2 g/dL 3.3  3.2  3.2   AST 0 - 37 U/L 68  69  24   ALT 0 - 35 U/L 43  44  15   Alk Phosphatase 39 - 117 U/L 96  105  48   Total Bilirubin 0.2 - 1.2 mg/dL 2.1  1.7  1.1   Bilirubin, Direct 0.0 - 0.3 mg/dL 0.5  0.4        Radiology Studies: CT Abdomen Pelvis W Contrast  Result Date: 11/05/2021 CLINICAL DATA:  Nausea vomiting. Unexplained weight loss of 100 lbs since December, nausea/vomitting Sx-cholecystectomy Hx of Lt breast cancer, XRT, lumpectomy EXAM: CT ABDOMEN AND PELVIS WITH CONTRAST TECHNIQUE: Multidetector CT imaging of the abdomen and pelvis was performed using the standard protocol following bolus administration of intravenous contrast. RADIATION DOSE REDUCTION: This exam was performed according to the departmental dose-optimization program which includes automated exposure control, adjustment of the mA and/or kV according to patient size and/or use of iterative reconstruction technique. CONTRAST:  181m ISOVUE-300 IOPAMIDOL (ISOVUE-300) INJECTION 61% COMPARISON:  None Available. FINDINGS: Lower chest: Persistent bilateral lower lobes patchy interlobular septal wall thickening ground-glass airspace opacities. Cardiomegaly. Coronary artery calcification. Tiny hiatal hernia. Hepatobiliary: Nodular hepatic contour. No focal liver abnormality. Status post cholecystectomy. No biliary dilatation. Pancreas: No focal lesion. Normal pancreatic contour. No surrounding inflammatory changes. No main pancreatic ductal dilatation. Spleen: Normal in size without focal abnormality. Adrenals/Urinary  Tract: No adrenal nodule bilaterally. Bilateral kidneys enhance symmetrically. Fluid density parapelvic lesion within left kidney likely represents a simple renal cyst measuring up to 6.9 cm. Simple renal cysts, in the absence of clinically indicated signs/symptoms,  require no independent follow-up. Left nephrolithiasis measuring up to 0.8 cm. No right nephrolithiasis. No hydronephrosis. No hydroureter. The urinary bladder is unremarkable. On delayed imaging, there is no urothelial wall thickening and there are no filling defects in the opacified portions of the bilateral collecting systems or ureters. Stomach/Bowel: Stomach is within normal limits. No evidence of bowel wall thickening or dilatation. Scattered colonic diverticulosis. The appendix is not definitely identified with no inflammatory changes in the right lower quadrant to suggest acute appendicitis. Vascular/Lymphatic: The main portal, splenic, superior mesenteric veins are patent. No abdominal aorta or iliac aneurysm. At least moderate to severe atherosclerotic plaque of the aorta and its branches. No abdominal, pelvic, or inguinal lymphadenopathy. Reproductive: Uterus and bilateral adnexa are unremarkable. Other: No intraperitoneal free fluid. No intraperitoneal free gas. No organized fluid collection. Musculoskeletal: No abdominal wall hernia or abnormality. Calcified granuloma within the left gluteal soft tissues. No suspicious lytic or blastic osseous lesions. No acute displaced fracture. Multilevel degenerative changes of the spine. IMPRESSION: 1. Cirrhosis with no portal hypertension. No focal liver lesions identified . Please note that liver protocol enhanced MR and CT are the most sensitive tests for the screening detection of hepatocellular carcinoma in the high risk setting of cirrhosis. 2.  Aortic Atherosclerosis (ICD10-I70.0). Electronically Signed   By: Iven Finn M.D.   On: 11/05/2021 20:08    CT AP reviewed with the patient and  independently.  Carmell Austria, MD 11/12/2021, 8:47 AM  Cc: Biagio Borg, MD

## 2021-11-13 DIAGNOSIS — G4733 Obstructive sleep apnea (adult) (pediatric): Secondary | ICD-10-CM | POA: Diagnosis not present

## 2021-11-13 NOTE — Progress Notes (Signed)
Please inform the patient. AST/ALT 395/207 (was 68/43, a month ago), Cr 1.2. INR 2.8 (may be d/t liver, rather than Xeralto or malnutrition as she has not been eating well since her husband's death), Plts now 143- S/O decompensated liver cirrhosis with possible DILI. Nl iron studies, ceruloplasmin, A1 AT, neg celiac.  Plan: -Stop Zetia, Lipitor, Macrodantin -Ultrasound Abdo with Doppler ASAP. If + ascites, will need diagnostic paracentesis.  In that case, send fluid for albumin, total protein, cell count, cytology and culture. -Low-salt diet -Follow AMA, ASMA, AFP -Vit K 10 mg PO QD x 3 days (Avoiding SQ as she is on AC) -Recheck LFTs, PT/INR in 1 week.  Also check ammonia in 1 week.  Vitamin D Send report to family physician

## 2021-11-14 ENCOUNTER — Other Ambulatory Visit: Payer: Self-pay

## 2021-11-14 DIAGNOSIS — R634 Abnormal weight loss: Secondary | ICD-10-CM

## 2021-11-14 DIAGNOSIS — K7460 Unspecified cirrhosis of liver: Secondary | ICD-10-CM

## 2021-11-14 LAB — CELIAC PANEL 10
Antigliadin Abs, IgA: 4 units (ref 0–19)
Endomysial IgA: NEGATIVE
Gliadin IgG: 2 units (ref 0–19)
IgA/Immunoglobulin A, Serum: 374 mg/dL (ref 64–422)
Tissue Transglut Ab: 2 U/mL (ref 0–5)
Transglutaminase IgA: <2 U/mL (ref 0–3)

## 2021-11-14 LAB — ALPHA-1-ANTITRYPSIN: A-1 Antitrypsin, Ser: 163 mg/dL (ref 83–199)

## 2021-11-14 LAB — ANTI-SMOOTH MUSCLE ANTIBODY, IGG: Actin (Smooth Muscle) Antibody (IGG): <20 U (ref <20)

## 2021-11-14 LAB — AFP TUMOR MARKER: AFP-Tumor Marker: 3.7 ng/mL

## 2021-11-14 LAB — CERULOPLASMIN: Ceruloplasmin: 31 mg/dL (ref 18–53)

## 2021-11-14 LAB — MITOCHONDRIAL ANTIBODIES: Mitochondrial M2 Ab, IgG: <=20 U (ref <=20.0)

## 2021-11-14 MED ORDER — PHYTONADIONE 5 MG PO TABS: 10.0000 mg | ORAL_TABLET | Freq: Every day | ORAL | 0 refills | Status: AC

## 2021-11-17 DIAGNOSIS — J441 Chronic obstructive pulmonary disease with (acute) exacerbation: Secondary | ICD-10-CM | POA: Diagnosis not present

## 2021-11-17 DIAGNOSIS — J9601 Acute respiratory failure with hypoxia: Secondary | ICD-10-CM | POA: Diagnosis not present

## 2021-11-21 ENCOUNTER — Encounter: Payer: Self-pay | Admitting: Gastroenterology

## 2021-11-22 ENCOUNTER — Other Ambulatory Visit (INDEPENDENT_AMBULATORY_CARE_PROVIDER_SITE_OTHER): Payer: Medicare Other

## 2021-11-22 ENCOUNTER — Encounter: Payer: Self-pay | Admitting: Internal Medicine

## 2021-11-22 ENCOUNTER — Ambulatory Visit (INDEPENDENT_AMBULATORY_CARE_PROVIDER_SITE_OTHER): Payer: Medicare Other | Admitting: Internal Medicine

## 2021-11-22 ENCOUNTER — Ambulatory Visit (HOSPITAL_COMMUNITY)
Admission: RE | Admit: 2021-11-22 | Discharge: 2021-11-22 | Disposition: A | Discharge status: Home / Self Care | Payer: Medicare Other | Source: Ambulatory Visit | Attending: Gastroenterology | Admitting: Gastroenterology

## 2021-11-22 VITALS — BP 116/60 | HR 84 | Temp 97.8°F | Ht 66.0 in | Wt 233.0 lb

## 2021-11-22 DIAGNOSIS — R634 Abnormal weight loss: Secondary | ICD-10-CM

## 2021-11-22 DIAGNOSIS — N281 Cyst of kidney, acquired: Secondary | ICD-10-CM | POA: Diagnosis not present

## 2021-11-22 DIAGNOSIS — Z0001 Encounter for general adult medical examination with abnormal findings: Secondary | ICD-10-CM | POA: Diagnosis not present

## 2021-11-22 DIAGNOSIS — I10 Essential (primary) hypertension: Secondary | ICD-10-CM | POA: Diagnosis not present

## 2021-11-22 DIAGNOSIS — F419 Anxiety disorder, unspecified: Secondary | ICD-10-CM

## 2021-11-22 DIAGNOSIS — F32A Depression, unspecified: Secondary | ICD-10-CM | POA: Diagnosis not present

## 2021-11-22 DIAGNOSIS — R7303 Prediabetes: Secondary | ICD-10-CM | POA: Diagnosis not present

## 2021-11-22 DIAGNOSIS — J4530 Mild persistent asthma, uncomplicated: Secondary | ICD-10-CM

## 2021-11-22 DIAGNOSIS — K7460 Unspecified cirrhosis of liver: Secondary | ICD-10-CM | POA: Insufficient documentation

## 2021-11-22 LAB — HEPATIC FUNCTION PANEL
ALT: 577 U/L — ABNORMAL HIGH (ref 0–35)
AST: 848 U/L — ABNORMAL HIGH (ref 0–37)
Albumin: 3.8 g/dL (ref 3.5–5.2)
Alkaline Phosphatase: 170 U/L — ABNORMAL HIGH (ref 39–117)
Bilirubin, Direct: 0.7 mg/dL — ABNORMAL HIGH (ref 0.0–0.3)
Total Bilirubin: 2 mg/dL — ABNORMAL HIGH (ref 0.2–1.2)
Total Protein: 8 g/dL (ref 6.0–8.3)

## 2021-11-22 LAB — AMMONIA: Ammonia: 38 umol/L — ABNORMAL HIGH (ref 11–35)

## 2021-11-22 LAB — VITAMIN D 25 HYDROXY (VIT D DEFICIENCY, FRACTURES): VITD: 44.02 ng/mL (ref 30.00–100.00)

## 2021-11-22 LAB — PROTIME-INR
INR: 1.9 ratio — ABNORMAL HIGH (ref 0.8–1.0)
Prothrombin Time: 19.8 s — ABNORMAL HIGH (ref 9.6–13.1)

## 2021-11-22 NOTE — Progress Notes (Signed)
Patient ID: [name], female   DOB: Dec 23, 1935, 86 y.o.   MRN: [id number]         Chief Complaint:: wellness exam and wheezing, weakness, htn, hyperglyemia       HPI:  ALISSAH REDMON is a 86 y.o. female here for wellness exam; declines shingrix, covid boostser, tdap p/w up to date                        Also Pt denies chest pain, increased sob or doe, orthopnea, PND, increased LE swelling, palpitations, dizziness or syncope, but has some expiratory wheezing and weakness,    Pt denies polydipsia, polyuria, or new focal neuro s/s.    Pt denies fever, night sweats, loss of appetite, or other constitutional symptoms, though has significant wt since her husband died late last yr.  Denies worsening depressive symptoms, suicidal ideation, or panic; has ongoing anxiety   Wt Readings from Last 3 Encounters:  11/22/21 233 lb (105.7 kg)  11/12/21 234 lb (106.1 kg)  11/07/21 233 lb 6.4 oz (105.9 kg)   BP Readings from Last 3 Encounters:  11/22/21 116/60  11/12/21 122/72  11/07/21 104/60   Immunization History  Administered Date(s) Administered   Fluad Quad(high Dose 65+) 03/30/2019, 01/21/2020, 04/24/2021   Influenza Split 04/06/2009, 04/05/2010, 01/10/2011, 02/10/2012, 04/23/2013   Influenza, High Dose Seasonal PF 03/20/2017, 01/19/2018   Influenza-Unspecified 05/08/2015, 01/23/2016   PFIZER(Purple Top)SARS-COV-2 Vaccination 06/11/2019, 07/06/2019, 02/28/2020   Pneumococcal Conjugate-13 06/01/2013, 03/20/2017   Pneumococcal Polysaccharide-23 03/16/2003, 01/23/2016   Td 12/08/2009   Zoster, Live 06/07/2015  There are no preventive care reminders to display for this patient.    Past Medical History:  Diagnosis Date   Allergy    Anxiety    Arthritis    no cartlidge in knees   Asthma    Bradycardia    a. atenolol stopped due to this, HR 40s.   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  CAD s/p RCA stent 2000 with residual mild-mod disease of left  system 2001.   Chronic atrial fibrillation (HCC)    COPD (chronic obstructive pulmonary disease) ([place])    Essential hypertension 03/24/2015   Gout    post operative   Hx of radiation therapy 04/02/11 -05/20/11   left breast   Hyperlipidemia    Hypothyroidism 03/24/2015   Incontinence of urine    Malignant neoplasm of upper-outer quadrant of female breast ([place]) 12/26/2010   Osteoporosis 03/24/2015   Peripheral neuropathy 03/24/2015   Plantar fasciitis    Sleep apnea    Past Surgical History:  Procedure Laterality Date   BREAST LUMPECTOMY W/ NEEDLE LOCALIZATION  01/29/2011   Left with [facility] Dr Margot Chimes   CHOLECYSTECTOMY  1955   CORONARY ANGIOPLASTY WITH STENT PLACEMENT  2000   DILATION AND CURETTAGE OF UTERUS  1976   and 1996   [place]   tendons between thumb and forefinger   SKIN BIOPSY     1994, 2006, 2008, 2010, 2011, 2011, 2012-  pre-cancerous   TONSILLECTOMY  1955    reports that she quit smoking about 59 years ago. Her smoking use included cigarettes. She has a 4.00 pack-year smoking history. She has never used smokeless tobacco. She reports that she does not drink alcohol and does not use drugs. family history includes Asthma in her sister; Heart attack in her brother and father; Heart  disease in her brother and father; Rectal cancer in her father. Allergies  Allergen Reactions   Crestor [Rosuvastatin Calcium]     Severe liver function problems   Hydrocodone-Acetaminophen Anxiety and Itching   Lidocaine Hcl Other (See Comments)    Novacaine:  Becomes very shaky   Lipitor [Atorvastatin Calcium] Other (See Comments)    Elevated liver function    Procaine Other (See Comments)    shaking   Rosuvastatin Other (See Comments)    Severe liver function problems   Theophylline Other (See Comments)    Becomes very shaky skaking skaking   Vicodin [Hydrocodone-Acetaminophen] Itching    Itching all over   Codeine Nausea Only, Anxiety and Other (See Comments)    Also  complained of dizziness.  Has same problems with oxycodone and hydrocodone Visual Disturbance, Balance Difficulty, Dizzy "Room Spinning; "Swimming" Balance, vision, nausea, dizzy, "swimming", "room spinning" Balance, vision, nausea, dizzy, "swimming", "room spinning"   Ephedrine Other (See Comments)    shaking   Other Other (See Comments)    Quinine causes nausea, dizzy   Oxycodone Other (See Comments)    Balance, vision, nausea, dizzy, "swimming", "room spinning"   Oxycodone-Acetaminophen Nausea Only, Anxiety and Other (See Comments)    Visual Disturbance, Balance Difficulty, Dizzy "Room Spinning; "Swimming"   Propoxyphene Anxiety, Nausea Only and Other (See Comments)    Visual Disturbance, Balance Difficulty, Dizzy "Room Spinning; "Swimming" Balance, vision, nausea, dizzy, "swimming", "room spinning" Balance, vision, nausea, dizzy, "swimming, "room spinning"   Quinine Derivatives Nausea Only    dizzy   Sulfa Antibiotics Nausea Only    Also complained of dizziness   Epinephrine Other (See Comments)    Patient becomes "shaky" shaking shaking   Penicillins Rash    Pt reported all over body rash in 1958 Has patient had a PCN reaction causing immediate rash, facial/tongue/throat swelling, SOB or lightheadedness with hypotension:Yes Has patient had a PCN reaction causing severe rash involving mucus membranes or skin necrosis: No Has patient had a PCN reaction that required hospitalization: No Has patient had a PCN reaction occurring within the last 10 years:No     Quinine Other (See Comments)   Theophyllines Other (See Comments)    Patient becomes "shaky"   Current Outpatient Medications on File Prior to Visit  Medication Sig Dispense Refill   albuterol (PROVENTIL) (2.5 MG/3ML) 0.083% nebulizer solution Take 3 mLs (2.5 mg total) by nebulization every 6 (six) hours as needed for wheezing or shortness of breath. 75 mL 12   ALLERGY RELIEF 180 MG tablet TAKE 1 TABLET BY MOUTH EVERY DAY  90 tablet 1   allopurinol (ZYLOPRIM) 300 MG tablet Take 300 mg by mouth daily.     ALPRAZolam (XANAX) 0.5 MG tablet TAKE 1 TO 2 TABLETS BY MOUTH TWICE DAILY AS NEEDED FOR ANXIETY OR SLEEP 60 tablet 2   Ascorbic Acid (VITAMIN C) 1000 MG tablet Take 1,000 mg by mouth in the morning.     atorvastatin (LIPITOR) 20 MG tablet Take 1 tablet (20 mg total) by mouth daily. 90 tablet 3   azelastine (ASTELIN) 0.1 % nasal spray Place 2 sprays into both nostrils daily. Use in each nostril as directed (Patient taking differently: Place 2 sprays into both nostrils in the morning.) 30 mL 5   B Complex Vitamins (VITAMIN B COMPLEX PO) Take 1 tablet by mouth daily.     Biotin 5000 MCG CAPS Take 5,000 mcg by mouth daily.      cholecalciferol (VITAMIN D) 1000 UNITS tablet  Take 1,000 Units by mouth daily.     Choline Dihydrogen Citrate (CHOLINE CITRATE PO) Take 1 capsule by mouth in the morning.     co-enzyme Q-10 50 MG capsule Take 100 mg by mouth daily.     colchicine 0.6 MG tablet Take 0.6 mg by mouth daily as needed (flare  up).      Cyanocobalamin (VITAMIN B-12 PO) Take 1 tablet by mouth daily.     cycloSPORINE (RESTASIS) 0.05 % ophthalmic emulsion Apply 1 drop to eye 2 (two) times daily as needed (allergies).     dextromethorphan-guaiFENesin (MUCINEX DM) 30-600 MG 12hr tablet Take 1 tablet by mouth 2 (two) times daily.     EPINEPHrine 0.3 mg/0.3 mL IJ SOAJ injection Inject 0.3 mg into the muscle as needed for anaphylaxis.     escitalopram (LEXAPRO) 10 MG tablet TAKE 1 TABLET(10 MG) BY MOUTH DAILY 90 tablet 3   ezetimibe (ZETIA) 10 MG tablet Take 10 mg by mouth 2 (two) times a week. Take on Tues & Fri     fluticasone (FLONASE) 50 MCG/ACT nasal spray Place 1 spray into both nostrils every morning.     fluticasone-salmeterol (ADVAIR) 500-50 MCG/ACT AEPB Inhale 1 puff into the lungs in the morning and at bedtime.     gabapentin (NEURONTIN) 300 MG capsule Take 1 capsule (300 mg total) by mouth 3 (three) times daily.  (Patient taking differently: Take 300 mg by mouth at bedtime.) 270 capsule 1   Homeopathic Products (ARNICARE) GEL Apply 1 application topically daily as needed (soreness).     hydrochlorothiazide (HYDRODIURIL) 25 MG tablet TAKE 2 TABLETS(50 MG) BY MOUTH DAILY 180 tablet 1   levalbuterol (XOPENEX HFA) 45 MCG/ACT inhaler Inhale 2 puffs into the lungs every 6 (six) hours as needed for wheezing or shortness of breath. 3 Inhaler 3   levocetirizine (XYZAL) 5 MG tablet Take 1 tablet (5 mg total) by mouth every evening. 30 tablet 11   levothyroxine (SYNTHROID) 75 MCG tablet Take 1 tablet (75 mcg total) by mouth daily before breakfast. 90 tablet 1   metoprolol succinate (TOPROL-XL) 25 MG 24 hr tablet TAKE 1/2 TABLET(12.5 MG) BY MOUTH DAILY (Patient taking differently: 12.5 mg daily. TAKE 1 TABLET(12.5 MG) BY MOUTH DAILY) 45 tablet 0   montelukast (SINGULAIR) 10 MG tablet Take 1 tablet (10 mg total) by mouth at bedtime. 90 tablet 1   Omega-3 Fatty Acids (FISH OIL) 1000 MG CAPS Take 1 capsule by mouth daily.     ondansetron (ZOFRAN) 4 MG tablet Take 1 tablet (4 mg total) by mouth every 8 (eight) hours as needed for nausea or vomiting. 40 tablet 3   pantoprazole (PROTONIX) 40 MG tablet Take 1 tablet (40 mg total) by mouth daily. 30 tablet 3   potassium chloride SA (KLOR-CON M) 20 MEQ tablet TAKE 1 TABLET(20 MEQ) BY MOUTH DAILY 90 tablet 3   Pyridoxine HCl (VITAMIN B-6 PO) Take 1 tablet by mouth daily.     [place] 420 MG/3.5ML SOCT INJECT 1 UNIT INTO THE SKIN EVERY 30 DAYS 3.5 mL 11   TART CHERRY PO Take 1 capsule by mouth in the morning and at bedtime.     VITAMIN E PO Take 1 tablet by mouth daily.     XARELTO 20 MG TABS tablet TAKE 1 TABLET(20 MG) BY MOUTH DAILY WITH SUPPER 90 tablet 1   Zinc 100 MG TABS Take 1 tablet by mouth in the morning.     No current facility-administered medications on  file prior to visit.        ROS:  All others reviewed and negative.  Objective        PE:   BP 116/60 (BP Location: Right Arm, Patient Position: Sitting, Cuff Size: Large)   Pulse 84   Temp 97.8 F (36.6 C) (Oral)   Ht $R'5\' 6"'UJ$  (1.676 m)   Wt 233 lb (105.7 kg)   SpO2 94%   BMI 37.61 kg/m                 Constitutional: Pt appears in NAD               HENT: Head: NCAT.                Right Ear: External ear normal.                 Left Ear: External ear normal.                Eyes: . Pupils are equal, round, and reactive to light. Conjunctivae and EOM are normal               Nose: without d/c or deformity               Neck: Neck supple. Gross normal ROM               Cardiovascular: Normal rate and regular rhythm.                 Pulmonary/Chest: Effort normal and breath sounds without rales or wheezing.                Abd:  Soft, NT, ND, + BS, no organomegaly               Neurological: Pt is alert. At baseline orientation, motor grossly intact               Skin: Skin is warm. No rashes, no other new lesions, LE edema - none               Psychiatric: Pt behavior is normal without agitation   Micro: none  Cardiac tracings I have personally interpreted today:  none  Pertinent Radiological findings (summarize): none   Lab Results  Component Value Date   WBC 4.8 11/12/2021   HGB 12.7 11/12/2021   HCT 38.8 11/12/2021   PLT 143.0 (L) 11/12/2021   GLUCOSE 109 (H) 11/12/2021   CHOL 102 09/28/2021   TRIG 92.0 09/28/2021   HDL 38.90 (L) 09/28/2021   LDLDIRECT 107.0 09/17/2016   LDLCALC 45 09/28/2021   ALT 577 (H) 11/22/2021   AST 848 (H) 11/22/2021   NA 140 11/12/2021   K 3.2 (L) 11/12/2021   CL 105 11/12/2021   CREATININE 1.24 (H) 11/12/2021   BUN 23 11/12/2021   CO2 25 11/12/2021   TSH 1.24 09/28/2021   INR 1.9 (H) 11/22/2021   HGBA1C 5.8 09/28/2021   Assessment/Plan:  MARVIE BREVIK is a 86 y.o. 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  Encounter for well adult  exam with abnormal findings Age and sex appropriate education and counseling updated with regular exercise and diet Referrals for preventative services - none needed Immunizations addressed - decliens shingrix, covid booster Smoking counseling  - none needed Evidence for depression or other mood disorder - denies significant Most recent labs reviewed. I have personally reviewed and have noted: 1) the patient's medical and social history 2) The patient's current medications and supplements 3) The patient's height, weight, and BMI have been recorded in the chart   Prediabetes Lab Results  Component Value Date   HGBA1C 5.8 09/28/2021   Stable, pt to continue current medical treatment  - diet, wt control   Essential hypertension BP Readings from Last 3 Encounters:  11/22/21 116/60  11/12/21 122/72  11/07/21 104/60   Stable, pt to continue medical treatment hct 25 qd, toprolx l 25 qd   Asthma Stable, no evidence of worsening today, pt reassured  Anxiety and depression Stable overall, declines need for change in tx or referral  Followup: Return in about 6 months (around 05/25/2022).  Cathlean Cower, MD 11/24/2021 10:14 PM [facility]

## 2021-11-22 NOTE — Patient Instructions (Signed)
Please continue all other medications as before, and refills have been done if requested.  Please have the pharmacy call with any other refills you may need.  Please continue your efforts at being more active, low cholesterol diet, and weight control.  You are otherwise up to date with prevention measures today.  Please keep your appointments with your specialists as you may have planned  No further lab work needed today  Please make an Appointment to return in 6 months, or sooner if needed 

## 2021-11-24 ENCOUNTER — Encounter: Payer: Self-pay | Admitting: Internal Medicine

## 2021-11-24 NOTE — Assessment & Plan Note (Signed)
Stable overall, declines need for change in tx or referral

## 2021-11-24 NOTE — Assessment & Plan Note (Signed)
Lab Results  Component Value Date   HGBA1C 5.8 09/28/2021   Stable, pt to continue current medical treatment  - diet, wt control

## 2021-11-24 NOTE — Assessment & Plan Note (Signed)
BP Readings from Last 3 Encounters:  11/22/21 116/60  11/12/21 122/72  11/07/21 104/60   Stable, pt to continue medical treatment hct 25 qd, toprolx l 25 qd

## 2021-11-24 NOTE — Assessment & Plan Note (Signed)
Stable, no evidence of worsening today, pt reassured

## 2021-11-24 NOTE — Assessment & Plan Note (Signed)
Age and sex appropriate education and counseling updated with regular exercise and diet Referrals for preventative services - none needed Immunizations addressed - decliens shingrix, covid booster Smoking counseling  - none needed Evidence for depression or other mood disorder - denies significant Most recent labs reviewed. I have personally reviewed and have noted: 1) the patient's medical and social history 2) The patient's current medications and supplements 3) The patient's height, weight, and BMI have been recorded in the chart

## 2021-11-25 NOTE — Progress Notes (Signed)
Please inform the patient.  Increasing LFTs (AST, ALT and now AP). INR 1.9. Nl AFP [place] with doppler: neg except for cirrhosis. No Ascites Meds with potential hepatotoxicity have been stopped CT c/w cirrhosis Neg   Plan: -MRI liver with MRCP -Pl make sure she is not taking meds- pl see my last note -Stop taking all over-the-counter herbs/supplements -On 7/25- Check CBC, CMP, INR, acute hepatitis panel, ammonia -Needs to be seen in GI clinic with me or APP in 2 weeks.   Send report to family physician

## 2021-11-26 ENCOUNTER — Other Ambulatory Visit: Payer: Self-pay

## 2021-11-26 DIAGNOSIS — K7460 Unspecified cirrhosis of liver: Secondary | ICD-10-CM

## 2021-11-26 DIAGNOSIS — R7989 Other specified abnormal findings of blood chemistry: Secondary | ICD-10-CM

## 2021-11-26 DIAGNOSIS — R634 Abnormal weight loss: Secondary | ICD-10-CM

## 2021-11-27 ENCOUNTER — Other Ambulatory Visit (INDEPENDENT_AMBULATORY_CARE_PROVIDER_SITE_OTHER): Payer: Medicare Other

## 2021-11-27 DIAGNOSIS — K7460 Unspecified cirrhosis of liver: Secondary | ICD-10-CM

## 2021-11-27 DIAGNOSIS — R7989 Other specified abnormal findings of blood chemistry: Secondary | ICD-10-CM | POA: Diagnosis not present

## 2021-11-27 DIAGNOSIS — R634 Abnormal weight loss: Secondary | ICD-10-CM

## 2021-11-27 LAB — CBC WITH DIFFERENTIAL/PLATELET
Basophils Absolute: 0 10*3/uL (ref 0.0–0.1)
Basophils Relative: 0.6 % (ref 0.0–3.0)
Eosinophils Absolute: 0.1 10*3/uL (ref 0.0–0.7)
Eosinophils Relative: 3.2 % (ref 0.0–5.0)
HCT: 40.4 % (ref 36.0–46.0)
Hemoglobin: 13.1 g/dL (ref 12.0–15.0)
Lymphocytes Relative: 23.2 % (ref 12.0–46.0)
Lymphs Abs: 1 10*3/uL (ref 0.7–4.0)
MCHC: 32.6 g/dL (ref 30.0–36.0)
MCV: 101.6 fl — ABNORMAL HIGH (ref 78.0–100.0)
Monocytes Absolute: 0.6 10*3/uL (ref 0.1–1.0)
Monocytes Relative: 14.4 % — ABNORMAL HIGH (ref 3.0–12.0)
Neutro Abs: 2.6 10*3/uL (ref 1.4–7.7)
Neutrophils Relative %: 58.6 % (ref 43.0–77.0)
Platelets: 174 10*3/uL (ref 150.0–400.0)
RBC: 3.98 Mil/uL (ref 3.87–5.11)
RDW: 16.2 % — ABNORMAL HIGH (ref 11.5–15.5)
WBC: 4.5 10*3/uL (ref 4.0–10.5)

## 2021-11-27 LAB — COMPREHENSIVE METABOLIC PANEL
ALT: 568 U/L — ABNORMAL HIGH (ref 0–35)
AST: 775 U/L — ABNORMAL HIGH (ref 0–37)
Albumin: 3.7 g/dL (ref 3.5–5.2)
Alkaline Phosphatase: 165 U/L — ABNORMAL HIGH (ref 39–117)
BUN: 19 mg/dL (ref 6–23)
CO2: 25 mEq/L (ref 19–32)
Calcium: 10.2 mg/dL (ref 8.4–10.5)
Chloride: 104 mEq/L (ref 96–112)
Creatinine, Ser: 1.02 mg/dL (ref 0.40–1.20)
GFR: 50.61 mL/min — ABNORMAL LOW (ref >60.00)
Glucose, Bld: 99 mg/dL (ref 70–99)
Potassium: 3.4 mEq/L — ABNORMAL LOW (ref 3.5–5.1)
Sodium: 139 mEq/L (ref 135–145)
Total Bilirubin: 2.2 mg/dL — ABNORMAL HIGH (ref 0.2–1.2)
Total Protein: 8 g/dL (ref 6.0–8.3)

## 2021-11-27 LAB — PROTIME-INR
INR: 2 ratio — ABNORMAL HIGH (ref 0.8–1.0)
Prothrombin Time: 21.5 s — ABNORMAL HIGH (ref 9.6–13.1)

## 2021-11-27 LAB — AMMONIA: Ammonia: 53 umol/L — ABNORMAL HIGH (ref 11–35)

## 2021-11-28 LAB — HEPATITIS PANEL, ACUTE
Hep A IgM: NONREACTIVE
Hep B C IgM: NONREACTIVE
Hepatitis B Surface Ag: NONREACTIVE
Hepatitis C Ab: NONREACTIVE

## 2021-11-30 ENCOUNTER — Other Ambulatory Visit: Payer: Self-pay

## 2021-11-30 DIAGNOSIS — R7989 Other specified abnormal findings of blood chemistry: Secondary | ICD-10-CM

## 2021-11-30 DIAGNOSIS — K7460 Unspecified cirrhosis of liver: Secondary | ICD-10-CM

## 2021-11-30 DIAGNOSIS — R634 Abnormal weight loss: Secondary | ICD-10-CM

## 2021-12-03 ENCOUNTER — Telehealth: Payer: Self-pay | Admitting: Gastroenterology

## 2021-12-04 ENCOUNTER — Ambulatory Visit: Payer: Medicare Other | Admitting: Emergency Medicine

## 2021-12-04 NOTE — Telephone Encounter (Signed)
Pt questioned when she needed to have labs drawn again: Pt was notified that labs were needed to be drawn Thursday or Friday of this week: Pt verbalized understanding with all questions answered.

## 2021-12-04 NOTE — Progress Notes (Signed)
12/10/2021 [name] [id number] Feb 23, 1936  Referring provider: Biagio Borg, MD Primary GI doctor: Dr. Lyndel Safe  ASSESSMENT AND PLAN:  Assessment: 86 y.o. female here for assessment of the following: 1. Hepatic cirrhosis, unspecified hepatic cirrhosis type, unspecified whether ascites present (Ages)   2. Pulmonary hypertension (HCC)   3. Reactive depression   4. Constipation, unspecified constipation type   5. Weight loss    11/27/2021 WBC 4.5 HGB 13.1 MCV 101.6 Platelets 174.0 11/27/2021 AST 775 ALT 568 Alkphos 165 TBili 2.2 11/27/2021 INR 2.0 Last AFP 11/12/2021 3.7   MELD 3.0: 14 at 12/06/2021  1:38 PM MELD-Na: 13 at 12/06/2021  1:38 PM Calculated from: Serum Creatinine: 1.09 mg/dL at 12/06/2021  1:38 PM Serum Sodium: 138 mEq/L (Using max of 137 mEq/L) at 12/06/2021  1:38 PM Total Bilirubin: 2.0 mg/dL at 12/06/2021  1:38 PM Serum Albumin: 3.7 g/dL (Using max of 3.5 g/dL) at 12/06/2021  1:38 PM INR(ratio): 1.3 ratio at 12/06/2021  1:38 PM Age at listing (hypothetical): 54 years Sex: Female at 12/06/2021  1:38 PM  Plan: - will repeat labs prior to Dr. Steve Rattler appointment.  - will do trial of low dose remeron 15 mg can start 1/2 pill a night - can consider endoscopic evaluation if continuing weight loss.  -[place] screening due 05/2021 -Hepatic encephalopathy-Patient does not have history of encephalopathy. - Ascites- No evidence of ascites on exam.  -Nutrition and low sodium diet discussed with patient and information given -Continue daily multivitamin -Recommended 30 minutes of aerobic and resistance exercise 3 days/week   History of Present Illness:  86 y.o. female with medical history significant for COPD/asthma on nocturnal O2, OSA on CPAP, coronary artery disease, heart failure diastolic (EF 60 to 16%) A-fib on Xarelto, sinus node dysfunction, peripheral neuropathy, history of left breast cancer 2012, recently diagnosed cirrhosis without portal hypertension seen on CT 11/2021.  Presents  for follow up of cirrhosis secondary to NASH.  Normal liver studies last office visit with Dr. Lyndel Safe 11/12/2021 Iron, ferritin. , alpha 1 antitrypsin, ceruloplasmin, mitochondrial antibody, Anti-smooth muscle antibody, and TTG/IGA. Patient had elevated LFTs, taken off all of her supplements and has had decrease in her LFTs since that last time.  Husband passed in Dec, had decreased appetite with this.  Had pneumonia x 2 was on O2 and in and out of hospital, UTIs x 2, and has had weight loss. MRCP was unremarkable.  She has been drinking ensure, she will eat omelette in the morning.  Will eat dinner with checken/veggie and fruit.  She is moving more per daughters, and feeling better in her knees from weight loss.  Nausea and vomiting has resolved with the PPI.  Has less BM's  Lab Results  Component Value Date   ALT 381 (H) 12/06/2021   AST 470 (H) 12/06/2021   ALKPHOS 133 (H) 12/06/2021   BILITOT 2.0 (H) 12/06/2021   There is not evidence of portal hypertension.  Patient is never had endoscopy or colonoscopy, and is not interested Patient has longstanding history of constipation better with Dulcolax Patient does not have history of encephalopathy. She denies swelling.  She is not on spironolactone and lasix, she is on HCTZ. Patient states can be dark urine/orange color to normal, stopped supplements and this did not help. Has been on B12 supplements, off x 1-2 weeks ago. No dysuria, no lower AB pain. Left kidney stone.  Wt Readings from Last 6 Encounters:  12/10/21 221 lb 4 oz (100.4 kg)  11/22/21  233 lb (105.7 kg)  11/12/21 234 lb (106.1 kg)  11/07/21 233 lb 6.4 oz (105.9 kg)  11/02/21 228 lb (103.4 kg)  10/03/21 252 lb 12.8 oz (114.7 kg)   11/27/2021 Ammonia 53 Last HCC screen: 11/22/2021 normal hepatic venous Doppler, query early hepatic cirrhosis Last AFP 11/12/2021 3.7  Last INR: 11/27/2021 2.0 - patient is on xarelto  Hepatitis immunity status: Will check Hep B immunity, if  not will set up for Hep A and B 11/27/2021 HepBsAG NON-REACTIVE  11/27/2021 HepCAB NON-REACTIVE   CT AP 11/05/2021 1. Cirrhosis with no portal hypertension. No focal liver lesions identified . Please note that liver protocol enhanced MR and CT are the most sensitive tests for the screening detection of hepatocellular carcinoma in the high risk setting of cirrhosis. 2.  Aortic Atherosclerosis (ICD10-I70.0). 3.  Small hiatal hernia.   Ba swallow 06/29/2021 IMPRESSION: 1. Limited mobility exam. 2. No esophageal stricture or mass.  No obstruction. 3. Mild esophageal dysmotility.   2DE 06/2021: EF 60 to 65%.  LVH.  Unable to determine diastolic function.  Moderately elevated PASP   Current Medications:   Current Outpatient Medications (Endocrine & Metabolic):    levothyroxine (SYNTHROID) 75 MCG tablet, Take 1 tablet (75 mcg total) by mouth daily before breakfast.  Current Outpatient Medications (Cardiovascular):    atorvastatin (LIPITOR) 20 MG tablet, Take 1 tablet (20 mg total) by mouth daily.   EPINEPHrine 0.3 mg/0.3 mL IJ SOAJ injection, Inject 0.3 mg into the muscle as needed for anaphylaxis.   ezetimibe (ZETIA) 10 MG tablet, Take 10 mg by mouth 2 (two) times a week. Take on Tues & Fri   hydrochlorothiazide (HYDRODIURIL) 25 MG tablet, TAKE 2 TABLETS(50 MG) BY MOUTH DAILY   metoprolol succinate (TOPROL-XL) 25 MG 24 hr tablet, TAKE 1/2 TABLET(12.5 MG) BY MOUTH DAILY (Patient taking differently: 12.5 mg daily. TAKE 1 TABLET(12.5 MG) BY MOUTH DAILY)   REPATHA PUSHTRONEX SYSTEM 420 MG/3.5ML SOCT, INJECT 1 UNIT INTO THE SKIN EVERY 30 DAYS  Current Outpatient Medications (Respiratory):    albuterol (PROVENTIL) (2.5 MG/3ML) 0.083% nebulizer solution, Take 3 mLs (2.5 mg total) by nebulization every 6 (six) hours as needed for wheezing or shortness of breath.   ALLERGY RELIEF 180 MG tablet, TAKE 1 TABLET BY MOUTH EVERY DAY   azelastine (ASTELIN) 0.1 % nasal spray, Place 2 sprays into both  nostrils daily. Use in each nostril as directed (Patient taking differently: Place 2 sprays into both nostrils in the morning.)   fluticasone-salmeterol (ADVAIR) 500-50 MCG/ACT AEPB, Inhale 1 puff into the lungs in the morning and at bedtime.   levalbuterol (XOPENEX HFA) 45 MCG/ACT inhaler, Inhale 2 puffs into the lungs every 6 (six) hours as needed for wheezing or shortness of breath.   levocetirizine (XYZAL) 5 MG tablet, Take 1 tablet (5 mg total) by mouth every evening.   montelukast (SINGULAIR) 10 MG tablet, Take 1 tablet (10 mg total) by mouth at bedtime.   dextromethorphan-guaiFENesin (MUCINEX DM) 30-600 MG 12hr tablet, Take 1 tablet by mouth 2 (two) times daily. (Patient not taking: Reported on 12/10/2021)   fluticasone (FLONASE) 50 MCG/ACT nasal spray, Place 1 spray into both nostrils every morning. (Patient not taking: Reported on 12/10/2021)  Current Outpatient Medications (Analgesics):    allopurinol (ZYLOPRIM) 300 MG tablet, Take 300 mg by mouth daily.   colchicine 0.6 MG tablet, Take 0.6 mg by mouth daily as needed (flare  up).   Current Outpatient Medications (Hematological):    XARELTO 20 MG TABS  tablet, TAKE 1 TABLET(20 MG) BY MOUTH DAILY WITH SUPPER   Cyanocobalamin (VITAMIN B-12 PO), Take 1 tablet by mouth daily. (Patient not taking: Reported on 12/10/2021)  Current Outpatient Medications (Other):    ALPRAZolam (XANAX) 0.5 MG tablet, TAKE 1 TO 2 TABLETS BY MOUTH TWICE DAILY AS NEEDED FOR ANXIETY OR SLEEP   Choline Dihydrogen Citrate (CHOLINE CITRATE PO), Take 1 capsule by mouth in the morning.   cycloSPORINE (RESTASIS) 0.05 % ophthalmic emulsion, Apply 1 drop to eye 2 (two) times daily as needed (allergies).   escitalopram (LEXAPRO) 10 MG tablet, TAKE 1 TABLET(10 MG) BY MOUTH DAILY   gabapentin (NEURONTIN) 300 MG capsule, Take 1 capsule (300 mg total) by mouth 3 (three) times daily. (Patient taking differently: Take 300 mg by mouth at bedtime.)   Homeopathic Products (ARNICARE) GEL,  Apply 1 application topically daily as needed (soreness).   ondansetron (ZOFRAN) 4 MG tablet, Take 1 tablet (4 mg total) by mouth every 8 (eight) hours as needed for nausea or vomiting.   pantoprazole (PROTONIX) 40 MG tablet, Take 1 tablet (40 mg total) by mouth daily.   potassium chloride SA (KLOR-CON M) 20 MEQ tablet, TAKE 1 TABLET(20 MEQ) BY MOUTH DAILY   Ascorbic Acid (VITAMIN C) 1000 MG tablet, Take 1,000 mg by mouth in the morning. (Patient not taking: Reported on 12/10/2021)   B Complex Vitamins (VITAMIN B COMPLEX PO), Take 1 tablet by mouth daily. (Patient not taking: Reported on 12/10/2021)   Biotin 5000 MCG CAPS, Take 5,000 mcg by mouth daily.  (Patient not taking: Reported on 12/10/2021)   cholecalciferol (VITAMIN D) 1000 UNITS tablet, Take 1,000 Units by mouth daily. (Patient not taking: Reported on 12/10/2021)   co-enzyme Q-10 50 MG capsule, Take 100 mg by mouth daily. (Patient not taking: Reported on 12/10/2021)   Omega-3 Fatty Acids (FISH OIL) 1000 MG CAPS, Take 1 capsule by mouth daily. (Patient not taking: Reported on 12/10/2021)   Pyridoxine HCl (VITAMIN B-6 PO), Take 1 tablet by mouth daily. (Patient not taking: Reported on 12/10/2021)   TART CHERRY PO, Take 1 capsule by mouth in the morning and at bedtime. (Patient not taking: Reported on 12/10/2021)   VITAMIN E PO, Take 1 tablet by mouth daily. (Patient not taking: Reported on 12/10/2021)   Zinc 100 MG TABS, Take 1 tablet by mouth in the morning. (Patient not taking: Reported on 12/10/2021)  Surgical History:  She  has a past surgical history that includes Tonsillectomy (1955); Cholecystectomy (1955); Hand surgery (1992); Coronary angioplasty with stent (2000); Skin biopsy; Dilation and curettage of uterus (1976); and Breast lumpectomy w/ needle localization (01/29/2011). Family History:  Her family history includes Asthma in her sister; Heart attack in her brother and father; Heart disease in her brother and father; Rectal cancer in her  father. Social History:   reports that she quit smoking about 59 years ago. Her smoking use included cigarettes. She has a 4.00 pack-year smoking history. She has never used smokeless tobacco. She reports that she does not drink alcohol and does not use drugs.  Current Medications, Allergies, Past Medical History, Past Surgical History, Family History and Social History were reviewed in [employer] record.  Physical Exam: BP 110/70   Pulse 84   Ht '5\' 6"'$  (1.676 m)   Wt 221 lb 4 oz (100.4 kg)   SpO2 95%   BMI 35.71 kg/m  General:   Pleasant, well developed female in no acute distress Heart : Regular rate and rhythm;  no murmurs Pulm: Clear anteriorly; no wheezing Abdomen:  Soft, Obese AB, Active bowel sounds. No tenderness . Without guarding and Without rebound, No organomegaly appreciated. Rectal: Not evaluated Extremities:  without  edema. Neurologic:  Alert and  oriented x4;  No focal deficits.  Psych:  Cooperative. Normal mood and affect.   Vladimir Crofts, PA-C 12/10/21

## 2021-12-05 ENCOUNTER — Telehealth: Payer: Self-pay | Admitting: Gastroenterology

## 2021-12-05 ENCOUNTER — Other Ambulatory Visit: Payer: Self-pay | Admitting: Gastroenterology

## 2021-12-05 ENCOUNTER — Ambulatory Visit (HOSPITAL_COMMUNITY)
Admission: RE | Admit: 2021-12-05 | Discharge: 2021-12-05 | Disposition: A | Discharge status: Home / Self Care | Payer: Medicare Other | Source: Ambulatory Visit | Attending: Gastroenterology | Admitting: Gastroenterology

## 2021-12-05 DIAGNOSIS — K7689 Other specified diseases of liver: Secondary | ICD-10-CM | POA: Diagnosis not present

## 2021-12-05 DIAGNOSIS — K7460 Unspecified cirrhosis of liver: Secondary | ICD-10-CM

## 2021-12-05 DIAGNOSIS — R7989 Other specified abnormal findings of blood chemistry: Secondary | ICD-10-CM | POA: Diagnosis not present

## 2021-12-05 DIAGNOSIS — R634 Abnormal weight loss: Secondary | ICD-10-CM | POA: Insufficient documentation

## 2021-12-05 DIAGNOSIS — K449 Diaphragmatic hernia without obstruction or gangrene: Secondary | ICD-10-CM | POA: Diagnosis not present

## 2021-12-05 MED ORDER — GADOBUTROL 1 MMOL/ML IV SOLN
10.0000 mL | Freq: Once | INTRAVENOUS | Status: AC | PRN
  Administered 2021-12-05: 10 mL via INTRAVENOUS

## 2021-12-05 NOTE — Telephone Encounter (Signed)
Pt stated that she had voice message on phone: I had spoken to pt already yesterday: Pt verbalized understanding with all questions answered.

## 2021-12-06 ENCOUNTER — Other Ambulatory Visit (INDEPENDENT_AMBULATORY_CARE_PROVIDER_SITE_OTHER): Payer: Medicare Other

## 2021-12-06 DIAGNOSIS — K7460 Unspecified cirrhosis of liver: Secondary | ICD-10-CM | POA: Diagnosis not present

## 2021-12-06 DIAGNOSIS — R634 Abnormal weight loss: Secondary | ICD-10-CM

## 2021-12-06 DIAGNOSIS — R7989 Other specified abnormal findings of blood chemistry: Secondary | ICD-10-CM | POA: Diagnosis not present

## 2021-12-06 LAB — COMPREHENSIVE METABOLIC PANEL
ALT: 381 U/L — ABNORMAL HIGH (ref 0–35)
AST: 470 U/L — ABNORMAL HIGH (ref 0–37)
Albumin: 3.7 g/dL (ref 3.5–5.2)
Alkaline Phosphatase: 133 U/L — ABNORMAL HIGH (ref 39–117)
BUN: 21 mg/dL (ref 6–23)
CO2: 27 mEq/L (ref 19–32)
Calcium: 10.2 mg/dL (ref 8.4–10.5)
Chloride: 103 mEq/L (ref 96–112)
Creatinine, Ser: 1.09 mg/dL (ref 0.40–1.20)
GFR: 46.72 mL/min — ABNORMAL LOW (ref >60.00)
Glucose, Bld: 95 mg/dL (ref 70–99)
Potassium: 3.3 mEq/L — ABNORMAL LOW (ref 3.5–5.1)
Sodium: 138 mEq/L (ref 135–145)
Total Bilirubin: 2 mg/dL — ABNORMAL HIGH (ref 0.2–1.2)
Total Protein: 8.2 g/dL (ref 6.0–8.3)

## 2021-12-06 LAB — CBC WITH DIFFERENTIAL/PLATELET
Basophils Absolute: 0.1 10*3/uL (ref 0.0–0.1)
Basophils Relative: 1.3 % (ref 0.0–3.0)
Eosinophils Absolute: 0.1 10*3/uL (ref 0.0–0.7)
Eosinophils Relative: 3.1 % (ref 0.0–5.0)
HCT: 41.9 % (ref 36.0–46.0)
Hemoglobin: 13.7 g/dL (ref 12.0–15.0)
Lymphocytes Relative: 28.8 % (ref 12.0–46.0)
Lymphs Abs: 1.3 10*3/uL (ref 0.7–4.0)
MCHC: 32.7 g/dL (ref 30.0–36.0)
MCV: 101.4 fl — ABNORMAL HIGH (ref 78.0–100.0)
Monocytes Absolute: 0.6 10*3/uL (ref 0.1–1.0)
Monocytes Relative: 13 % — ABNORMAL HIGH (ref 3.0–12.0)
Neutro Abs: 2.5 10*3/uL (ref 1.4–7.7)
Neutrophils Relative %: 53.8 % (ref 43.0–77.0)
Platelets: 151 10*3/uL (ref 150.0–400.0)
RBC: 4.13 Mil/uL (ref 3.87–5.11)
RDW: 16.7 % — ABNORMAL HIGH (ref 11.5–15.5)
WBC: 4.6 10*3/uL (ref 4.0–10.5)

## 2021-12-06 LAB — PROTIME-INR
INR: 1.3 ratio — ABNORMAL HIGH (ref 0.8–1.0)
Prothrombin Time: 13.8 s — ABNORMAL HIGH (ref 9.6–13.1)

## 2021-12-10 ENCOUNTER — Encounter: Payer: Self-pay | Admitting: Physician Assistant

## 2021-12-10 ENCOUNTER — Ambulatory Visit: Payer: Medicare Other | Admitting: Physician Assistant

## 2021-12-10 VITALS — BP 110/70 | HR 84 | Ht 66.0 in | Wt 221.2 lb

## 2021-12-10 DIAGNOSIS — K7460 Unspecified cirrhosis of liver: Secondary | ICD-10-CM

## 2021-12-10 DIAGNOSIS — I272 Pulmonary hypertension, unspecified: Secondary | ICD-10-CM | POA: Diagnosis not present

## 2021-12-10 DIAGNOSIS — R634 Abnormal weight loss: Secondary | ICD-10-CM | POA: Diagnosis not present

## 2021-12-10 DIAGNOSIS — F329 Major depressive disorder, single episode, unspecified: Secondary | ICD-10-CM | POA: Diagnosis not present

## 2021-12-10 DIAGNOSIS — K5900 Constipation, unspecified: Secondary | ICD-10-CM

## 2021-12-10 MED ORDER — MIRTAZAPINE 15 MG PO TABS: 15.0000 mg | ORAL_TABLET | Freq: Every day | ORAL | 2 refills | Status: DC

## 2021-12-10 NOTE — Patient Instructions (Addendum)
Take remeron, try 1/2 at night, stop if you have dizziness, can increase to 1 pill at night in 1-2 weeks if doing well.   Miralax is an osmotic laxative.  It only brings more water into the stool.  This is safe to take daily.  Can take up to 17 gram of miralax twice a day.  Mix with juice or coffee.  Start 1 capful at night for 3-4 days and reassess your response in 3-4 days.  You can increase and decrease the dose based on your response.  Remember, it can take up to 3-4 days to take effect OR for the effects to wear off.   I often pair this with benefiber in the morning to help assure the stool is not too loose.   Recommend increasing water and physical activity.   - Drink at least 64-80 ounces of water/liquid per day. - Establish a time to try to move your bowels every day.  For many people, this is after a cup of coffee or after a meal such as breakfast. - Sit all of the way back on the toilet keeping your back fairly straight and while sitting up, try to rest the tops of your forearms on your upper thighs.   - Raising your feet with a step stool/squatty potty can be helpful to improve the angle that allows your stool to pass through the rectum. - Relax the rectum feeling it bulge toward the toilet water.  If you feel your rectum raising toward your body, you are contracting rather than relaxing. - Breathe in and slowly exhale. "Belly breath" by expanding your belly towards your belly button. Keep belly expanded as you gently direct pressure down and back to the anus.  A low pitched GRRR sound can assist with increasing intra-abdominal pressure.  - Repeat 3-4 times. If unsuccessful, contract the pelvic floor to restore normal tone and get off the toilet.  Avoid excessive straining. - To reduce excessive wiping by teaching your anus to normally contract, place hands on outer aspect of knees and resist knee movement outward.  Hold 5-10 second then place hands just inside of knees and resist  inward movement of knees.  Hold 5 seconds.  Repeat a few times each way.  Go to the ER if unable to pass gas, severe AB pain, unable to hold down food, any shortness of breath of chest pain.   Given your low potassium , will see if we can add on a magnesium as this helps transport potassium. This is a list of high potassium foods. Please schedule follow-up with your primary care as you may need medication in addition to dietary changes.  Potassium Content of Foods  The body needs potassium to control blood pressure and to keep the muscles and nervous system healthy.  Here are some healthy foods below that are high in potassium. Also you can get the white label salt of "NO SALT" salt substitute, 1/4 teaspoon of this is equivalent to 86mq potassium.   FOODS AND DRINKS HIGH IN POTASSIUM FOODS MODERATE IN POTASSIUM   Fruits Avocado (cubed),  c / 50 g. Banana (sliced), 75 g. Cantaloupe (cubed), 80 g. Honeydew, 1 wedge / 85 g. Kiwi (sliced), 90 g. Nectarine, 1 small / 129 g. Orange, 1 medium / 131 g. Vegetables Artichoke,  of a medium / 64 g. Asparagus (boiled), 90 g.. Broccoli (boiled), 78 g. Brussels sprout (boiled), 78 g. Butternut squash (baked), 103 g. Chickpea (cooked), 82 g. Green peas (cooked),  80 g. Kidney beans (cooked), 5 tbsp / 55 g. Lima beans (cooked),  c / 43 g. Navy beans (cooked),  c / 61 g. Spinach (cooked),  c / 45 g. Sweet potato (baked),  c / 50 g. Tomato (chopped or sliced), 90 g. Vegetable juice. White mushrooms (cooked), 78 g. Yam (cooked or baked),  c / 34 g. Zucchini squash (boiled), 90 g. Other Foods and Drinks Almonds (whole),  c / 36 g. Fish, 3 oz / 85 g. Nonfat fruit variety yogurt, 123 g. Pistachio nuts, 1 oz / 28 g. Pumpkin seeds, 1 oz / 28 g. Red meat (broiled, cooked, grilled), 3 oz / 85 g. Scallops (steamed), 3 oz / 85 g. Spaghetti sauce,  c / 66 g. Sunflower seeds (dry roasted), 1 oz / 28 g. Veggie burger, 1 patty / 70 g.  Fruits Grapefruit,  of the fruit / 621 g Plums (sliced), 83 g. Tangerine, 1 large / 120 g. Vegetables Carrots (boiled), 78 g. Carrots (sliced), 61 g. Rhubarb (cooked with sugar), 120 g. Rutabaga (cooked), 120 g. Yellow snap beans (cooked), 63 g. Other Foods and Drinks  Chicken breast (roasted and chopped),  c / 70 g. Pita bread, 1 large / 64 g. Shrimp (steamed), 4 oz / 113 g. Swiss cheese (diced), 70 g.    _______________________________________________________  If you are age 80 or older, your body mass index should be between 23-30. Your Body mass index is 35.71 kg/m. If this is out of the aforementioned range listed, please consider follow up with your Primary Care Provider.  If you are age 74 or younger, your body mass index should be between 19-25. Your Body mass index is 35.71 kg/m. If this is out of the aformentioned range listed, please consider follow up with your Primary Care Provider.   ________________________________________________________  The [place] [facility] providers would like to encourage you to use [facility] to communicate with providers for non-urgent requests or questions.  Due to long hold times on the telephone, sending your provider a message by [facility] may be a faster and more efficient way to get a response.  Please allow 48 business hours for a response.  Please remember that this is for non-urgent requests.  _______________________________________________________    I appreciate the  opportunity to care for you  Thank You   [facility]

## 2021-12-12 ENCOUNTER — Other Ambulatory Visit (INDEPENDENT_AMBULATORY_CARE_PROVIDER_SITE_OTHER): Payer: Medicare Other

## 2021-12-12 DIAGNOSIS — R634 Abnormal weight loss: Secondary | ICD-10-CM

## 2021-12-12 DIAGNOSIS — K7460 Unspecified cirrhosis of liver: Secondary | ICD-10-CM

## 2021-12-12 LAB — CBC WITH DIFFERENTIAL/PLATELET
Basophils Absolute: 0 10*3/uL (ref 0.0–0.1)
Basophils Relative: 0.8 % (ref 0.0–3.0)
Eosinophils Absolute: 0.1 10*3/uL (ref 0.0–0.7)
Eosinophils Relative: 2.7 % (ref 0.0–5.0)
HCT: 43.2 % (ref 36.0–46.0)
Hemoglobin: 14.2 g/dL (ref 12.0–15.0)
Lymphocytes Relative: 26.3 % (ref 12.0–46.0)
Lymphs Abs: 1.3 10*3/uL (ref 0.7–4.0)
MCHC: 32.9 g/dL (ref 30.0–36.0)
MCV: 101.3 fl — ABNORMAL HIGH (ref 78.0–100.0)
Monocytes Absolute: 0.6 10*3/uL (ref 0.1–1.0)
Monocytes Relative: 12.6 % — ABNORMAL HIGH (ref 3.0–12.0)
Neutro Abs: 2.9 10*3/uL (ref 1.4–7.7)
Neutrophils Relative %: 57.6 % (ref 43.0–77.0)
Platelets: 153 10*3/uL (ref 150.0–400.0)
RBC: 4.26 Mil/uL (ref 3.87–5.11)
RDW: 16.2 % — ABNORMAL HIGH (ref 11.5–15.5)
WBC: 5 10*3/uL (ref 4.0–10.5)

## 2021-12-12 LAB — COMPREHENSIVE METABOLIC PANEL
ALT: 219 U/L — ABNORMAL HIGH (ref 0–35)
AST: 242 U/L — ABNORMAL HIGH (ref 0–37)
Albumin: 3.8 g/dL (ref 3.5–5.2)
Alkaline Phosphatase: 106 U/L (ref 39–117)
BUN: 27 mg/dL — ABNORMAL HIGH (ref 6–23)
CO2: 26 mEq/L (ref 19–32)
Calcium: 10 mg/dL (ref 8.4–10.5)
Chloride: 105 mEq/L (ref 96–112)
Creatinine, Ser: 1.12 mg/dL (ref 0.40–1.20)
GFR: 45.22 mL/min — ABNORMAL LOW (ref >60.00)
Glucose, Bld: 98 mg/dL (ref 70–99)
Potassium: 3.4 mEq/L — ABNORMAL LOW (ref 3.5–5.1)
Sodium: 141 mEq/L (ref 135–145)
Total Bilirubin: 1.6 mg/dL — ABNORMAL HIGH (ref 0.2–1.2)
Total Protein: 8.1 g/dL (ref 6.0–8.3)

## 2021-12-12 LAB — AMMONIA: Ammonia: 37 umol/L — ABNORMAL HIGH (ref 11–35)

## 2021-12-12 LAB — PROTIME-INR
INR: 2.2 ratio — ABNORMAL HIGH (ref 0.8–1.0)
Prothrombin Time: 23.3 s — ABNORMAL HIGH (ref 9.6–13.1)

## 2021-12-12 LAB — CORTISOL: Cortisol, Plasma: 2.2 ug/dL

## 2021-12-14 DIAGNOSIS — G4733 Obstructive sleep apnea (adult) (pediatric): Secondary | ICD-10-CM | POA: Diagnosis not present

## 2021-12-18 DIAGNOSIS — J9601 Acute respiratory failure with hypoxia: Secondary | ICD-10-CM | POA: Diagnosis not present

## 2021-12-18 DIAGNOSIS — J441 Chronic obstructive pulmonary disease with (acute) exacerbation: Secondary | ICD-10-CM | POA: Diagnosis not present

## 2021-12-19 DIAGNOSIS — M1712 Unilateral primary osteoarthritis, left knee: Secondary | ICD-10-CM | POA: Diagnosis not present

## 2021-12-19 DIAGNOSIS — M1711 Unilateral primary osteoarthritis, right knee: Secondary | ICD-10-CM | POA: Diagnosis not present

## 2021-12-19 DIAGNOSIS — M170 Bilateral primary osteoarthritis of knee: Secondary | ICD-10-CM | POA: Diagnosis not present

## 2021-12-24 DIAGNOSIS — J4540 Moderate persistent asthma, uncomplicated: Secondary | ICD-10-CM | POA: Diagnosis not present

## 2021-12-24 DIAGNOSIS — J3089 Other allergic rhinitis: Secondary | ICD-10-CM | POA: Diagnosis not present

## 2021-12-24 DIAGNOSIS — T63461D Toxic effect of venom of wasps, accidental (unintentional), subsequent encounter: Secondary | ICD-10-CM | POA: Diagnosis not present

## 2021-12-24 DIAGNOSIS — J301 Allergic rhinitis due to pollen: Secondary | ICD-10-CM | POA: Diagnosis not present

## 2021-12-26 ENCOUNTER — Encounter: Payer: Self-pay | Admitting: Gastroenterology

## 2021-12-26 ENCOUNTER — Ambulatory Visit: Payer: Medicare Other | Admitting: Gastroenterology

## 2021-12-26 ENCOUNTER — Other Ambulatory Visit (INDEPENDENT_AMBULATORY_CARE_PROVIDER_SITE_OTHER): Payer: Medicare Other

## 2021-12-26 ENCOUNTER — Other Ambulatory Visit: Payer: Medicare Other

## 2021-12-26 VITALS — BP 118/78 | HR 76 | Ht 66.0 in | Wt 225.0 lb

## 2021-12-26 DIAGNOSIS — K7460 Unspecified cirrhosis of liver: Secondary | ICD-10-CM | POA: Diagnosis not present

## 2021-12-26 DIAGNOSIS — R634 Abnormal weight loss: Secondary | ICD-10-CM

## 2021-12-26 DIAGNOSIS — R300 Dysuria: Secondary | ICD-10-CM

## 2021-12-26 LAB — PROTIME-INR
INR: 1.9 ratio — ABNORMAL HIGH (ref 0.8–1.0)
Prothrombin Time: 20 s — ABNORMAL HIGH (ref 9.6–13.1)

## 2021-12-26 LAB — CBC WITH DIFFERENTIAL/PLATELET
Basophils Absolute: 0 10*3/uL (ref 0.0–0.1)
Basophils Relative: 0.5 % (ref 0.0–3.0)
Eosinophils Absolute: 0.2 10*3/uL (ref 0.0–0.7)
Eosinophils Relative: 3.2 % (ref 0.0–5.0)
HCT: 40.4 % (ref 36.0–46.0)
Hemoglobin: 13.4 g/dL (ref 12.0–15.0)
Lymphocytes Relative: 26.2 % (ref 12.0–46.0)
Lymphs Abs: 1.6 10*3/uL (ref 0.7–4.0)
MCHC: 33.2 g/dL (ref 30.0–36.0)
MCV: 101.4 fl — ABNORMAL HIGH (ref 78.0–100.0)
Monocytes Absolute: 0.7 10*3/uL (ref 0.1–1.0)
Monocytes Relative: 10.8 % (ref 3.0–12.0)
Neutro Abs: 3.6 10*3/uL (ref 1.4–7.7)
Neutrophils Relative %: 59.3 % (ref 43.0–77.0)
Platelets: 136 10*3/uL — ABNORMAL LOW (ref 150.0–400.0)
RBC: 3.98 Mil/uL (ref 3.87–5.11)
RDW: 16.1 % — ABNORMAL HIGH (ref 11.5–15.5)
WBC: 6.1 10*3/uL (ref 4.0–10.5)

## 2021-12-26 LAB — URINALYSIS WITH CULTURE, IF INDICATED
Bilirubin Urine: NEGATIVE
Ketones, ur: NEGATIVE
Leukocytes,Ua: NEGATIVE
Nitrite: NEGATIVE
Specific Gravity, Urine: 1.02 (ref 1.000–1.030)
Total Protein, Urine: NEGATIVE
Urine Glucose: NEGATIVE
Urobilinogen, UA: 2 — AB (ref 0.0–1.0)
pH: 6 (ref 5.0–8.0)

## 2021-12-26 LAB — COMPREHENSIVE METABOLIC PANEL
ALT: 36 U/L — ABNORMAL HIGH (ref 0–35)
AST: 36 U/L (ref 0–37)
Albumin: 3.6 g/dL (ref 3.5–5.2)
Alkaline Phosphatase: 57 U/L (ref 39–117)
BUN: 24 mg/dL — ABNORMAL HIGH (ref 6–23)
CO2: 27 mEq/L (ref 19–32)
Calcium: 9.7 mg/dL (ref 8.4–10.5)
Chloride: 106 mEq/L (ref 96–112)
Creatinine, Ser: 1 mg/dL (ref 0.40–1.20)
GFR: 51.8 mL/min — ABNORMAL LOW (ref >60.00)
Glucose, Bld: 101 mg/dL — ABNORMAL HIGH (ref 70–99)
Potassium: 3.3 mEq/L — ABNORMAL LOW (ref 3.5–5.1)
Sodium: 140 mEq/L (ref 135–145)
Total Bilirubin: 1.9 mg/dL — ABNORMAL HIGH (ref 0.2–1.2)
Total Protein: 6.8 g/dL (ref 6.0–8.3)

## 2021-12-26 MED ORDER — PANTOPRAZOLE SODIUM 40 MG PO TBEC: 40.0000 mg | DELAYED_RELEASE_TABLET | Freq: Every day | ORAL | 4 refills | Status: DC

## 2021-12-26 NOTE — Patient Instructions (Addendum)
_______________________________________________________  If you are age 84 or older, your body mass index should be between 23-30. Your Body mass index is 36.32 kg/m. If this is out of the aforementioned range listed, please consider follow up with your Primary Care Provider.  If you are age 81 or younger, your body mass index should be between 19-25. Your Body mass index is 36.32 kg/m. If this is out of the aformentioned range listed, please consider follow up with your Primary Care Provider.   ________________________________________________________  The [place] [facility] providers would like to encourage you to use [facility] to communicate with providers for non-urgent requests or questions.  Due to long hold times on the telephone, sending your provider a message by [facility] may be a faster and more efficient way to get a response.  Please allow 48 business hours for a response.  Please remember that this is for non-urgent requests.  _______________________________________________________  Your provider has requested that you go to the basement level for lab work before leaving today. Press "B" on the elevator. The lab is located at the first door on the left as you exit the elevator.  We have sent the following medications to your pharmacy for you to pick up at your convenience: Protonix  Please follow up in 3 months. Give [place] a call at [phone] to schedule an appointment.  Thank you,  Dr. Jackquline Denmark

## 2021-12-26 NOTE — Progress Notes (Signed)
Chief Complaint: FU  Referring Provider:  [name], MD      ASSESSMENT AND PLAN;   #1. 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 She does have pulm HTN without overt RHF. No hepatic encephalopathy, EV or ascites (on imaging).  #2. GERD  #3. Low AM cortisol level-to be evaluated by PCP. (Could be due to prev prednisone)  Plan: -CBC, CMP, PT INR, UA -protonix 40mg  po QD #30, 3 RF -Low salt diet. -Avoid hepatotoxic meds. -FU in 12 weeks.   HPI:    [name] is a 86 y.o. female  With H/O asthma, COPD on home O2 (nightly),  OSA on CPAP (followed by Dr. 97), CAD, CHF (Nl EF 60-65%), hypertension, sinus node dysfunction, A-fib on Xarelto, hypothyroid, peripheral neuropathy, osteoarthritis, stage Ia left breast cancer 2012, HLD, obesity, gout, prediabetes.  FU NASH cirrhosis. Had sudden increase in LFTs, better since all of the supplements have been stopped.   Feels "100% better" "Previous [name] is back"  She has good appetite.  Has easy bruisability and multiple bruises d/t Xarelto.  No change in mental status.  No nausea, vomiting, heartburn, regurgitation, odynophagia or dysphagia.  No significant diarrhea or constipation.  No melena or hematochezia. No abdominal pain.  No further weight loss.   She is compliant with low sodium diet.  No nausea, vomiting, heartburn, regurgitation, odynophagia or dysphagia (had in past, ba swallow showed esophageal dysmotility).  No significant diarrhea.  No melena or hematochezia. No unintentional weight loss. No abdominal pain.  MELD 3.0: 18 at 12/12/2021 10:25 AM MELD-Na: 18 at 12/12/2021 10:25 AM Calculated from: Serum Creatinine: 1.12 mg/dL at 02/11/2022 10/09/3056 AM Serum Sodium: 141 mEq/L (Using max of 137 mEq/L) at 12/12/2021 10:25 AM Total Bilirubin: 1.6 mg/dL at 02/11/2022 07/04/4436 AM Serum Albumin: 3.8 g/dL (Using max of 3.5 g/dL) at 22:91 08/08/1317 AM INR(ratio): 2.2 ratio at 12/12/2021 10:25 AM Age at listing  (hypothetical): 86 years Sex: Female at 12/12/2021 10:25 AM       Latest Ref Rng & Units 12/12/2021   10:25 AM 12/06/2021    1:38 PM 11/27/2021   10:12 AM  Hepatic Function  Total Protein 6.0 - 8.3 g/dL 8.1  8.2  8.0   Albumin 3.5 - 5.2 g/dL 3.8  3.7  3.7   AST 0 - 37 U/L 242  470  775   ALT 0 - 35 U/L 219  381  568   Alk Phosphatase 39 - 117 U/L 106  133  165   Total Bilirubin 0.2 - 1.2 mg/dL 1.6  2.0  2.2      Wt Readings from Last 3 Encounters:  12/26/21 225 lb (102.1 kg)  12/10/21 221 lb 4 oz (100.4 kg)  11/22/21 233 lb (105.7 kg)      Previous GI work-up:  MRCP 12/05/2021 1. Hepatic morphologic changes as can be seen with given diagnosis of cirrhosis. No suspicious hepatic lesion. 2. Prominent periportal and portacaval lymph nodes are favored reactive in the setting of cirrhosis. 3. Small hiatal hernia.  02/04/2022 with Doppler: Cirrhosis with normal hepatic venous Doppler.  Never had EGD/colonoscopy.  Does not want one  CT AP 11/05/2021 1. Cirrhosis with no portal hypertension. No focal liver lesions identified . Please note that liver protocol enhanced MR and CT are the most sensitive tests for the screening detection of hepatocellular carcinoma in the high risk setting of cirrhosis. 2.  Aortic Atherosclerosis (ICD10-I70.0). 3.  Small hiatal hernia.  Ba swallow 06/29/2021 IMPRESSION: 1. Limited mobility exam. 2. No esophageal stricture or mass.  No obstruction. 3. Mild esophageal dysmotility.  2DE 06/2021: EF 60 to 65%.  LVH.  Unable to determine diastolic function.  Moderately elevated PASP      SH: Lost her husband Apr 28, 2021 unexpectedly d/t heart attack (has been married for over 65 years) Persing is [name] at [employer].  [name] is the other daughter.  Past Medical History:  Diagnosis Date   Allergy    Anxiety    Arthritis    no cartlidge in knees   Asthma    Bradycardia    a. atenolol stopped due to this, HR 40s.   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  CAD s/p RCA stent 2000 with residual mild-mod disease of left system 2001.   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   Osteoporosis 03/24/2015   Peripheral neuropathy 03/24/2015   Plantar fasciitis    Sleep apnea     Past Surgical History:  Procedure Laterality Date   BREAST LUMPECTOMY W/ NEEDLE LOCALIZATION  01/29/2011   Left with [facility] Dr Jamey Ripa   CHOLECYSTECTOMY  1955   CORONARY ANGIOPLASTY WITH STENT PLACEMENT  2000   DILATION AND CURETTAGE OF UTERUS  1976   and 1996   HAND SURGERY  1992   tendons between thumb and forefinger   SKIN BIOPSY     1994, 2006, 2008, 2010, 2011, 2011, 2012-  pre-cancerous   TONSILLECTOMY  1955    Family History  Problem Relation Age of Onset   Heart disease Father    Heart attack Father    Rectal cancer Father    Asthma Sister    Heart disease Brother    Heart attack Brother    Stomach cancer Neg Hx    Colon cancer Neg Hx     Social History   Tobacco Use   Smoking status: Former    Packs/day: 1.00    Years: 4.00    Total pack years: 4.00    Types: Cigarettes    Quit date: 12/05/1962    Years since quitting: 59.0   Smokeless tobacco: Never  Vaping Use   Vaping Use: Never used  Substance Use Topics   Alcohol use: No   Drug use: No    Current Outpatient Medications  Medication Sig Dispense Refill   albuterol (PROVENTIL) (2.5 MG/3ML) 0.083% nebulizer solution Take 3 mLs (2.5 mg total) by nebulization every 6 (six) hours as needed for wheezing or shortness of breath. 75 mL 12   ALLERGY RELIEF 180 MG tablet TAKE 1 TABLET BY MOUTH EVERY DAY 90 tablet 1   allopurinol  (ZYLOPRIM) 300 MG tablet Take 300 mg by mouth daily.     ALPRAZolam (XANAX) 0.5 MG tablet TAKE 1 TO 2 TABLETS BY MOUTH TWICE DAILY AS NEEDED FOR ANXIETY OR SLEEP 60 tablet 2   AMBULATORY NON FORMULARY MEDICATION Medication Name: Prednisone Injection in knee     Ascorbic Acid (VITAMIN C) 1000 MG tablet Take 1,000 mg by mouth in the morning.  atorvastatin (LIPITOR) 20 MG tablet Take 1 tablet (20 mg total) by mouth daily. 90 tablet 3   azelastine (ASTELIN) 0.1 % nasal spray Place 2 sprays into both nostrils daily. Use in each nostril as directed (Patient taking differently: Place 2 sprays into both nostrils in the morning.) 30 mL 5   Choline Dihydrogen Citrate (CHOLINE CITRATE PO) Take 1 capsule by mouth in the morning.     co-enzyme Q-10 50 MG capsule Take 100 mg by mouth daily.     colchicine 0.6 MG tablet Take 0.6 mg by mouth daily as needed (flare  up).      EPINEPHrine 0.3 mg/0.3 mL IJ SOAJ injection Inject 0.3 mg into the muscle as needed for anaphylaxis.     escitalopram (LEXAPRO) 10 MG tablet TAKE 1 TABLET(10 MG) BY MOUTH DAILY 90 tablet 3   ezetimibe (ZETIA) 10 MG tablet Take 10 mg by mouth 2 (two) times a week. Take on Tues & Fri     fluticasone (FLONASE) 50 MCG/ACT nasal spray Place 1 spray into both nostrils every morning.     fluticasone-salmeterol (ADVAIR) 500-50 MCG/ACT AEPB Inhale 1 puff into the lungs in the morning and at bedtime.     hydrochlorothiazide (HYDRODIURIL) 25 MG tablet TAKE 2 TABLETS(50 MG) BY MOUTH DAILY 180 tablet 1   levalbuterol (XOPENEX HFA) 45 MCG/ACT inhaler Inhale 2 puffs into the lungs every 6 (six) hours as needed for wheezing or shortness of breath. 3 Inhaler 3   levocetirizine (XYZAL) 5 MG tablet Take 1 tablet (5 mg total) by mouth every evening. 30 tablet 11   levothyroxine (SYNTHROID) 75 MCG tablet Take 1 tablet (75 mcg total) by mouth daily before breakfast. 90 tablet 1   metoprolol succinate (TOPROL-XL) 25 MG 24 hr tablet TAKE 1/2 TABLET(12.5 MG) BY  MOUTH DAILY (Patient taking differently: 12.5 mg daily. TAKE 1 TABLET(12.5 MG) BY MOUTH DAILY) 45 tablet 0   mirtazapine (REMERON) 15 MG tablet Take 1 tablet (15 mg total) by mouth at bedtime. 30 tablet 2   ondansetron (ZOFRAN) 4 MG tablet Take 1 tablet (4 mg total) by mouth every 8 (eight) hours as needed for nausea or vomiting. 40 tablet 3   pantoprazole (PROTONIX) 40 MG tablet Take 1 tablet (40 mg total) by mouth daily. 30 tablet 3   potassium chloride SA (KLOR-CON M) 20 MEQ tablet TAKE 1 TABLET(20 MEQ) BY MOUTH DAILY 90 tablet 3   REPATHA PUSHTRONEX SYSTEM 420 MG/3.5ML SOCT INJECT 1 UNIT INTO THE SKIN EVERY 30 DAYS 3.5 mL 11   XARELTO 20 MG TABS tablet TAKE 1 TABLET(20 MG) BY MOUTH DAILY WITH SUPPER 90 tablet 1   B Complex Vitamins (VITAMIN B COMPLEX PO) Take 1 tablet by mouth daily. (Patient not taking: Reported on 12/26/2021)     Biotin 5000 MCG CAPS Take 5,000 mcg by mouth daily.  (Patient not taking: Reported on 12/26/2021)     cholecalciferol (VITAMIN D) 1000 UNITS tablet Take 1,000 Units by mouth daily. (Patient not taking: Reported on 12/26/2021)     Cyanocobalamin (VITAMIN B-12 PO) Take 1 tablet by mouth daily. (Patient not taking: Reported on 12/26/2021)     cycloSPORINE (RESTASIS) 0.05 % ophthalmic emulsion Apply 1 drop to eye 2 (two) times daily as needed (allergies). (Patient not taking: Reported on 12/26/2021)     dextromethorphan-guaiFENesin (MUCINEX DM) 30-600 MG 12hr tablet Take 1 tablet by mouth 2 (two) times daily. (Patient not taking: Reported on 12/26/2021)     gabapentin (NEURONTIN) 300 MG  capsule Take 1 capsule (300 mg total) by mouth 3 (three) times daily. (Patient not taking: Reported on 12/26/2021) 270 capsule 1   Homeopathic Products (ARNICARE) GEL Apply 1 application topically daily as needed (soreness). (Patient not taking: Reported on 12/26/2021)     montelukast (SINGULAIR) 10 MG tablet Take 1 tablet (10 mg total) by mouth at bedtime. (Patient not taking: Reported on  12/26/2021) 90 tablet 1   Omega-3 Fatty Acids (FISH OIL) 1000 MG CAPS Take 1 capsule by mouth daily. (Patient not taking: Reported on 12/26/2021)     Pyridoxine HCl (VITAMIN B-6 PO) Take 1 tablet by mouth daily. (Patient not taking: Reported on 12/26/2021)     TART CHERRY PO Take 1 capsule by mouth in the morning and at bedtime. (Patient not taking: Reported on 12/26/2021)     VITAMIN E PO Take 1 tablet by mouth daily. (Patient not taking: Reported on 12/26/2021)     Zinc 100 MG TABS Take 1 tablet by mouth in the morning. (Patient not taking: Reported on 12/26/2021)     No current facility-administered medications for this visit.    Allergies  Allergen Reactions   Crestor [Rosuvastatin Calcium]     Severe liver function problems   Hydrocodone-Acetaminophen Anxiety and Itching   Lidocaine Hcl Other (See Comments)    Novacaine:  Becomes very shaky   Lipitor [Atorvastatin Calcium] Other (See Comments)    Elevated liver function    Procaine Other (See Comments)    shaking   Rosuvastatin Other (See Comments)    Severe liver function problems   Theophylline Other (See Comments)    Becomes very shaky skaking skaking   Vicodin [Hydrocodone-Acetaminophen] Itching    Itching all over   Codeine Nausea Only, Anxiety and Other (See Comments)    Also complained of dizziness.  Has same problems with oxycodone and hydrocodone Visual Disturbance, Balance Difficulty, Dizzy "Room Spinning; "Swimming" Balance, vision, nausea, dizzy, "swimming", "room spinning" Balance, vision, nausea, dizzy, "swimming", "room spinning"   Ephedrine Other (See Comments)    shaking   Other Other (See Comments)    Quinine causes nausea, dizzy   Oxycodone Other (See Comments)    Balance, vision, nausea, dizzy, "swimming", "room spinning"   Oxycodone-Acetaminophen Nausea Only, Anxiety and Other (See Comments)    Visual Disturbance, Balance Difficulty, Dizzy "Room Spinning; "Swimming"   Propoxyphene Anxiety, Nausea Only and  Other (See Comments)    Visual Disturbance, Balance Difficulty, Dizzy "Room Spinning; "Swimming" Balance, vision, nausea, dizzy, "swimming", "room spinning" Balance, vision, nausea, dizzy, "swimming, "room spinning"   Quinine Derivatives Nausea Only    dizzy   Sulfa Antibiotics Nausea Only    Also complained of dizziness   Epinephrine Other (See Comments)    Patient becomes "shaky" shaking shaking   Penicillins Rash    Pt reported all over body rash in 1958 Has patient had a PCN reaction causing immediate rash, facial/tongue/throat swelling, SOB or lightheadedness with hypotension:Yes Has patient had a PCN reaction causing severe rash involving mucus membranes or skin necrosis: No Has patient had a PCN reaction that required hospitalization: No Has patient had a PCN reaction occurring within the last 10 years:No     Quinine Other (See Comments)   Theophyllines Other (See Comments)    Patient becomes "shaky"    Review of Systems:  Situational anxiety.     Physical Exam:    BP 118/78   Pulse 76   Ht $R'5\' 6"'GI$  (1.676 m)   Wt 225 lb (102.1  kg)   SpO2 96%   BMI 36.32 kg/m  Wt Readings from Last 3 Encounters:  12/26/21 225 lb (102.1 kg)  12/10/21 221 lb 4 oz (100.4 kg)  11/22/21 233 lb (105.7 kg)   Constitutional:  Well-developed, in no acute distress. Psychiatric: Normal mood and affect. Behavior is normal. HEENT: Pupils normal.  Conjunctivae are normal. + scleral icterus. Cardiovascular: Normal rate, regular rhythm. No edema Pulmonary/chest: Effort normal and breath sounds normal. No wheezing, rales or rhonchi. Abdominal: Soft, nondistended. Nontender. Bowel sounds active throughout. There are no masses palpable. No hepatomegaly. Rectal: Deferred Neurological: Alert and oriented to person place and time. Skin: Skin is warm and dry. No rashes noted.  Data Reviewed: I have personally reviewed following labs and imaging studies  CBC:    Latest Ref Rng & Units 12/12/2021    10:25 AM 12/06/2021    1:38 PM 11/27/2021   10:12 AM  CBC  WBC 4.0 - 10.5 K/uL 5.0  4.6  4.5   Hemoglobin 12.0 - 15.0 g/dL 14.2  13.7  13.1   Hematocrit 36.0 - 46.0 % 43.2  41.9  40.4   Platelets 150.0 - 400.0 K/uL 153.0  151.0  174.0     CMP:    Latest Ref Rng & Units 12/12/2021   10:25 AM 12/06/2021    1:38 PM 11/27/2021   10:12 AM  CMP  Glucose 70 - 99 mg/dL 98  95  99   BUN 6 - 23 mg/dL $Remove'27  21  19   'mcSQtqp$ Creatinine 0.40 - 1.20 mg/dL 1.12  1.09  1.02   Sodium 135 - 145 mEq/L 141  138  139   Potassium 3.5 - 5.1 mEq/L 3.4  3.3  3.4   Chloride 96 - 112 mEq/L 105  103  104   CO2 19 - 32 mEq/L $Remove'26  27  25   'mOmzKDT$ Calcium 8.4 - 10.5 mg/dL 10.0  10.2  10.2   Total Protein 6.0 - 8.3 g/dL 8.1  8.2  8.0   Total Bilirubin 0.2 - 1.2 mg/dL 1.6  2.0  2.2   Alkaline Phos 39 - 117 U/L 106  133  165   AST 0 - 37 U/L 242  470  775   ALT 0 - 35 U/L 219  381  568       Latest Ref Rng & Units 12/12/2021   10:25 AM 12/06/2021    1:38 PM 11/27/2021   10:12 AM  Hepatic Function  Total Protein 6.0 - 8.3 g/dL 8.1  8.2  8.0   Albumin 3.5 - 5.2 g/dL 3.8  3.7  3.7   AST 0 - 37 U/L 242  470  775   ALT 0 - 35 U/L 219  381  568   Alk Phosphatase 39 - 117 U/L 106  133  165   Total Bilirubin 0.2 - 1.2 mg/dL 1.6  2.0  2.2       Radiology Studies: MR ABDOMEN MRCP W WO CONTAST  Result Date: 12/05/2021 CLINICAL DATA:  Weight loss, cirrhosis, increasing LFTs. EXAM: MRI ABDOMEN WITHOUT AND WITH CONTRAST (INCLUDING MRCP) TECHNIQUE: Multiplanar multisequence MR imaging of the abdomen was performed both before and after the administration of intravenous contrast. Heavily T2-weighted images of the biliary and pancreatic ducts were obtained, and three-dimensional MRCP images were rendered by post processing. CONTRAST:  18mL GADAVIST GADOBUTROL 1 MMOL/ML IV SOLN COMPARISON:  CT November 05, 2021 FINDINGS: Lower chest: Four-chamber cardiac enlargement. Heterogeneous signal in the lung bases  likely reflects atelectasis. Hepatobiliary: No  significant hepatic steatosis. Hepatic contour nodularity as can be seen with given diagnosis of cirrhosis. No suspicious hepatic lesion. Gallbladder surgically absent.  No biliary ductal dilation. Pancreas: No pancreatic ductal dilation or evidence of acute inflammation. Spleen:  No splenomegaly. Adrenals/Urinary Tract: Bilateral adrenal glands appear normal. Bilateral renal cysts measure up to 6.7 cm and are considered benign requiring no independent imaging follow-up. No suspicious renal mass. Stomach/Bowel: Small hiatal hernia. No pathologic dilation or evidence of acute inflammation involving loops of large or small bowel in the abdomen. Vascular/Lymphatic: Normal caliber abdominal aorta. Portal, splenic and superior mesenteric veins are patent. Prominent periportal and portacaval lymph nodes measuring up to 6 mm in diameter are favored reactive in the setting of cirrhosis. Other:  No significant abdominopelvic free fluid. Musculoskeletal: No suspicious bone lesions identified. IMPRESSION: 1. Hepatic morphologic changes as can be seen with given diagnosis of cirrhosis. No suspicious hepatic lesion. 2. Prominent periportal and portacaval lymph nodes are favored reactive in the setting of cirrhosis. 3. Small hiatal hernia. Electronically Signed   By: Dahlia Bailiff M.D.   On: 12/05/2021 13:58   MR 3D Recon At Scanner  Result Date: 12/05/2021 CLINICAL DATA:  Weight loss, cirrhosis, increasing LFTs. EXAM: MRI ABDOMEN WITHOUT AND WITH CONTRAST (INCLUDING MRCP) TECHNIQUE: Multiplanar multisequence MR imaging of the abdomen was performed both before and after the administration of intravenous contrast. Heavily T2-weighted images of the biliary and pancreatic ducts were obtained, and three-dimensional MRCP images were rendered by post processing. CONTRAST:  15mL GADAVIST GADOBUTROL 1 MMOL/ML IV SOLN COMPARISON:  CT November 05, 2021 FINDINGS: Lower chest: Four-chamber cardiac enlargement. Heterogeneous signal in the lung  bases likely reflects atelectasis. Hepatobiliary: No significant hepatic steatosis. Hepatic contour nodularity as can be seen with given diagnosis of cirrhosis. No suspicious hepatic lesion. Gallbladder surgically absent.  No biliary ductal dilation. Pancreas: No pancreatic ductal dilation or evidence of acute inflammation. Spleen:  No splenomegaly. Adrenals/Urinary Tract: Bilateral adrenal glands appear normal. Bilateral renal cysts measure up to 6.7 cm and are considered benign requiring no independent imaging follow-up. No suspicious renal mass. Stomach/Bowel: Small hiatal hernia. No pathologic dilation or evidence of acute inflammation involving loops of large or small bowel in the abdomen. Vascular/Lymphatic: Normal caliber abdominal aorta. Portal, splenic and superior mesenteric veins are patent. Prominent periportal and portacaval lymph nodes measuring up to 6 mm in diameter are favored reactive in the setting of cirrhosis. Other:  No significant abdominopelvic free fluid. Musculoskeletal: No suspicious bone lesions identified. IMPRESSION: 1. Hepatic morphologic changes as can be seen with given diagnosis of cirrhosis. No suspicious hepatic lesion. 2. Prominent periportal and portacaval lymph nodes are favored reactive in the setting of cirrhosis. 3. Small hiatal hernia. Electronically Signed   By: Dahlia Bailiff M.D.   On: 12/05/2021 13:58    CT AP reviewed with the patient and independently.  Carmell Austria, MD 12/26/2021, 8:50 AM  Cc: Biagio Borg, MD

## 2021-12-29 LAB — URINE CULTURE
MICRO NUMBER:: 13820410
SPECIMEN QUALITY:: ADEQUATE

## 2021-12-31 ENCOUNTER — Other Ambulatory Visit: Payer: Self-pay | Admitting: Internal Medicine

## 2021-12-31 ENCOUNTER — Telehealth: Payer: Self-pay | Admitting: Internal Medicine

## 2021-12-31 MED ORDER — CIPROFLOXACIN HCL 500 MG PO TABS: 500.0000 mg | ORAL_TABLET | Freq: Two times a day (BID) | ORAL | 0 refills | Status: AC

## 2021-12-31 NOTE — Progress Notes (Unsigned)
Ok for cipro - done erxto [facility]

## 2021-12-31 NOTE — Telephone Encounter (Signed)
Remo Lipps with [facility] calls today in regards to PT's recent urine culture of PT. Urine culture shows positive for bacteria klebsiella. Remo Lipps would like to speak with a CMA when possible to go over these findings.  CB: [phone]

## 2021-12-31 NOTE — Progress Notes (Signed)
[place] for cipro course for uti

## 2022-01-01 NOTE — Telephone Encounter (Signed)
Called and spoke with Remo Lipps.  Patient has been informed of abx being sent in.

## 2022-01-14 DIAGNOSIS — G4733 Obstructive sleep apnea (adult) (pediatric): Secondary | ICD-10-CM | POA: Diagnosis not present

## 2022-01-18 DIAGNOSIS — J441 Chronic obstructive pulmonary disease with (acute) exacerbation: Secondary | ICD-10-CM | POA: Diagnosis not present

## 2022-01-18 DIAGNOSIS — J9601 Acute respiratory failure with hypoxia: Secondary | ICD-10-CM | POA: Diagnosis not present

## 2022-02-01 ENCOUNTER — Other Ambulatory Visit: Payer: Self-pay | Admitting: Interventional Cardiology

## 2022-02-11 ENCOUNTER — Other Ambulatory Visit: Payer: Self-pay | Admitting: Internal Medicine

## 2022-02-11 NOTE — Telephone Encounter (Signed)
Please refill as per office routine med refill policy (all routine meds to be refilled for 3 mo or monthly (per pt preference) up to one year from last visit, then month to month grace period for 3 mo, then further med refills will have to be denied) ? ?

## 2022-02-12 ENCOUNTER — Ambulatory Visit: Payer: Medicare Other | Admitting: Emergency Medicine

## 2022-02-12 ENCOUNTER — Encounter: Payer: Self-pay | Admitting: Emergency Medicine

## 2022-02-12 VITALS — BP 104/68 | HR 85 | Temp 98.0°F | Ht 66.0 in | Wt 222.0 lb

## 2022-02-12 DIAGNOSIS — J4530 Mild persistent asthma, uncomplicated: Secondary | ICD-10-CM | POA: Diagnosis not present

## 2022-02-12 DIAGNOSIS — J301 Allergic rhinitis due to pollen: Secondary | ICD-10-CM | POA: Diagnosis not present

## 2022-02-12 DIAGNOSIS — R053 Chronic cough: Secondary | ICD-10-CM | POA: Diagnosis not present

## 2022-02-12 DIAGNOSIS — G4733 Obstructive sleep apnea (adult) (pediatric): Secondary | ICD-10-CM | POA: Diagnosis not present

## 2022-02-12 DIAGNOSIS — J849 Interstitial pulmonary disease, unspecified: Secondary | ICD-10-CM

## 2022-02-12 DIAGNOSIS — Z23 Encounter for immunization: Secondary | ICD-10-CM | POA: Diagnosis not present

## 2022-02-12 MED ORDER — FEXOFENADINE HCL 180 MG PO TABS: 180.0000 mg | ORAL_TABLET | Freq: Every day | ORAL | 5 refills | Status: DC

## 2022-02-12 NOTE — Assessment & Plan Note (Signed)
Post COVID.  Follow-up imaging 10/08/2021 overall stable interstitial changes.  Reassuring.

## 2022-02-12 NOTE — Patient Instructions (Addendum)
Continue your Wixela twice a day.  Rinse and gargle after use. Keep albuterol available to use 2 puffs up to every 4 hours if needed for shortness of breath, chest tightness, wheezing.  Continue your Flonase and Astelin nasal sprays as you have been taking them. Continue Singulair 10 mg each evening Restart your fexofenadine 180 mg once daily, at least through the fall allergy season. Continue Protonix as you have been taking it. Flu shot today Recommend that you get the COVID-19 vaccine this fall Recommend that you get the RSV vaccine this fall Follow with Dr Lamonte Sakai in 6 months or sooner if you have any problems

## 2022-02-12 NOTE — Assessment & Plan Note (Signed)
Continue Protonix as you have been taking it. Continue allergy regimen

## 2022-02-12 NOTE — Assessment & Plan Note (Signed)
Continue your Wixela twice a day.  Rinse and gargle after use. Keep albuterol available to use 2 puffs up to every 4 hours if needed for shortness of breath, chest tightness, wheezing.  Flu shot today Recommend that you get the COVID-19 vaccine this fall Recommend that you get the RSV vaccine this fall Follow with Dr Lamonte Sakai in 6 months or sooner if you have any problems

## 2022-02-12 NOTE — Assessment & Plan Note (Signed)
Continue your Flonase and Astelin nasal sprays as you have been taking them. Continue Singulair 10 mg each evening Restart your fexofenadine 180 mg once daily, at least through the fall allergy season.

## 2022-02-12 NOTE — Assessment & Plan Note (Signed)
No longer uses CPAP. Not interested in reinitiating at this time.

## 2022-02-12 NOTE — Progress Notes (Signed)
Subjective:    Patient ID: [name], female    DOB: 1935-05-25, 86 y.o.   MRN: [id number]  Asthma She complains of cough, shortness of breath and wheezing. Associated symptoms include a fever. Pertinent negatives include no ear pain, headaches, postnasal drip, rhinorrhea, sneezing, sore throat or trouble swallowing. Her past medical history is significant for asthma.    Acute OV 09/25/21 --86 year old woman with mixed obstruction and restriction, probably fixed asthma.  Also with OSA on CPAP, allergic rhinitis, history of breast cancer, CAD, atrial fibrillation with diastolic dysfunction.  Managed on fexofenadine, Xyzal, Singulair, Astelin nasal spray, Mucinex DM, fluticasone nasal spray.  She is on [employer].  She was doing well in April following admission for dyspnea and B infiltrates, malaise. That was treated as PNA. Over the last couple weeks she has had progressive SOB, noted hypoxemia and needing her O2 (desats).  She has intermittently been confused. A lot of nausea. Some exertional weakness and dyspnea walking through the house.  The symptoms that she is experiencing now are reminiscent of how she was doing before she was admitted to the hospital last time.  No significant change in cough-remains dry, no sputum production.  She tells me she is worried she has pneumonia again.  Of note she was on prednisone through most of February and March, question whether she is missing this.  ROV 02/12/2022 --[name] is 86 with a history of mixed restrictive and obstructive disease, probable fixed asthma.  Also has OSA on CPAP.  She has dealt with chronic cough in the setting of her chronic rhinitis.  Finally has some persistent interstitial changes on CT chest in an NSIP pattern with some associated bronchiectatic change following COVID-19 infection.  PMH: Chronic rhinitis, breast cancer, CAD, atrial fibrillation with diastolic dysfunction Today she reports that she is feeling well. Her cough is  better - she is off allegra. Many of her meds were held due to recent elevated LFT. Remains on flonase and astelin, singulair. Started protonix per Dr Lyndel Safe. On wixella bid. Has not needed her albuterol.     Review of Systems  Constitutional:  Positive for fever and unexpected weight change.  HENT:  Negative for congestion, dental problem, ear pain, nosebleeds, postnasal drip, rhinorrhea, sinus pressure, sneezing, sore throat and trouble swallowing.   Eyes:  Negative for redness and itching.  Respiratory:  Positive for cough, shortness of breath and wheezing. Negative for chest tightness.   Cardiovascular:  Negative for palpitations and leg swelling.  Gastrointestinal:  Positive for nausea. Negative for vomiting.  Genitourinary:  Negative for dysuria.  Musculoskeletal:  Negative for joint swelling.  Skin:  Negative for rash.  Neurological:  Negative for headaches.  Hematological:  Does not bruise/bleed easily.  Psychiatric/Behavioral:  Positive for confusion. Negative for dysphoric mood. The patient is not nervous/anxious.     Past Medical History:  Diagnosis Date   Allergy    Anxiety    Arthritis    no cartlidge in knees   Asthma    Bradycardia    a. atenolol stopped due to this, HR 40s.   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  CAD s/p RCA stent 2000 with residual mild-mod disease of left system 2001.   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) 12/26/2010   Osteoporosis 03/24/2015   Peripheral neuropathy 03/24/2015   Plantar fasciitis    Sleep apnea      Family History  Problem Relation Age of Onset   Heart disease Father     Heart attack Father    Rectal cancer Father    Asthma Sister    Heart disease Brother    Heart attack Brother    Stomach cancer Neg Hx    Colon cancer Neg Hx      Social History   Socioeconomic History   Marital status: Widowed    Spouse name: Not on file   Number of children: 3   Years of education: Not on file   Highest education level: Not on file  Occupational History   Not on file  Tobacco Use   Smoking status: Former    Packs/day: 1.00    Years: 4.00    Total pack years: 4.00    Types: Cigarettes    Quit date: 12/05/1962    Years since quitting: 59.2   Smokeless tobacco: Never  Vaping Use   Vaping Use: Never used  Substance and Sexual Activity   Alcohol use: No   Drug use: No   Sexual activity: Not Currently  Other Topics Concern   Not on file  Social History Narrative   Right handed.   Husband passed 04/28/2021. Married x 63 years.   2 daughters, who live nearby.    Grew up in [place], at [place].    Likes to swim.    Social Determinants of Health   Financial Resource Strain: Low Risk  (11/02/2021)   Overall Financial Resource Strain (CARDIA)    Difficulty of Paying Living Expenses: Not hard at all  Food Insecurity: No Food Insecurity (11/02/2021)   Hunger Vital Sign    Worried About Running Out of Food in the Last Year: Never true    Ran Out of Food in the Last Year: Never true  Transportation Needs: No Transportation Needs (11/02/2021)   PRAPARE - Administrator, Civil Service (Medical): No    Lack of Transportation (Non-Medical): No  Physical Activity: Inactive (11/02/2021)   Exercise Vital Sign    Days of Exercise per Week: 0 days    Minutes of Exercise per Session: 0 min  Stress: No Stress Concern Present (11/02/2021)   [employer] of Occupational Health - Occupational Stress Questionnaire    Feeling of Stress : Not at all  Social Connections: Socially Integrated (11/02/2021)   Social Connection and Isolation Panel [NHANES]     Frequency of Communication with Friends and Family: More than three times a week    Frequency of Social Gatherings with Friends and Family: More than three times a week    Attends Religious Services: More than 4 times per year    Active Member of [employer] or Organizations: Yes    Attends Banker Meetings: More than 4 times per year    Marital Status: Married  Catering manager Violence: Not At Risk (11/02/2021)   Humiliation, Afraid, Rape, and Kick questionnaire    Fear of Current or Ex-Partner: No    Emotionally Abused: No    Physically Abused: No    Sexually Abused: No  No smoker exposure.  No occupational exposures  Allergies  Allergen Reactions   Crestor [Rosuvastatin Calcium]     Severe liver function problems   Hydrocodone-Acetaminophen Anxiety and Itching   Lidocaine Hcl Other (See Comments)    Novacaine:  Becomes very shaky   Lipitor [Atorvastatin Calcium] Other (See Comments)    Elevated liver function    Procaine Other (See Comments)    shaking   Rosuvastatin Other (See Comments)    Severe liver function problems   Theophylline Other (See Comments)    Becomes very shaky skaking skaking   Vicodin [Hydrocodone-Acetaminophen] Itching    Itching all over   Codeine Nausea Only, Anxiety and Other (See Comments)    Also complained of dizziness.  Has same problems with oxycodone and hydrocodone Visual Disturbance, Balance Difficulty, Dizzy "Room Spinning; "Swimming" Balance, vision, nausea, dizzy, "swimming", "room spinning" Balance, vision, nausea, dizzy, "swimming", "room spinning"   Ephedrine Other (See Comments)    shaking   Other Other (See Comments)    Quinine causes nausea, dizzy   Oxycodone Other (See Comments)    Balance, vision, nausea, dizzy, "swimming", "room spinning"   Oxycodone-Acetaminophen Nausea Only, Anxiety and Other (See Comments)    Visual Disturbance, Balance Difficulty, Dizzy "Room Spinning; "Swimming"   Propoxyphene Anxiety, Nausea  Only and Other (See Comments)    Visual Disturbance, Balance Difficulty, Dizzy "Room Spinning; "Swimming" Balance, vision, nausea, dizzy, "swimming", "room spinning" Balance, vision, nausea, dizzy, "swimming, "room spinning"   Quinine Derivatives Nausea Only    dizzy   Sulfa Antibiotics Nausea Only    Also complained of dizziness   Epinephrine Other (See Comments)    Patient becomes "shaky" shaking shaking   Penicillins Rash    Pt reported all over body rash in 1958 Has patient had a PCN reaction causing immediate rash, facial/tongue/throat swelling, SOB or lightheadedness with hypotension:Yes Has patient had a PCN reaction causing severe rash involving mucus membranes or skin necrosis: No Has patient had a PCN reaction that required hospitalization: No Has patient had a PCN reaction occurring within the last 10 years:No     Quinine Other (See Comments)   Theophyllines Other (See Comments)    Patient becomes "shaky"     Outpatient Medications Prior to Visit  Medication Sig Dispense Refill   albuterol (PROVENTIL) (2.5 MG/3ML) 0.083% nebulizer solution Take 3 mLs (2.5 mg total) by nebulization every 6 (six) hours as needed for wheezing or shortness of breath. 75 mL 12   allopurinol (ZYLOPRIM) 300 MG tablet Take 300 mg by mouth daily.     AMBULATORY NON FORMULARY MEDICATION Medication Name: Prednisone Injection in knee     atorvastatin (LIPITOR) 20 MG tablet Take 1 tablet (20 mg total) by mouth daily. 90 tablet 3   azelastine (ASTELIN) 0.1 % nasal spray Place 2 sprays into both nostrils daily. Use in each nostril as directed (Patient taking differently: Place 2 sprays into both nostrils in the morning.) 30 mL 5   Cyanocobalamin (VITAMIN B-12 PO) Take 1 tablet by mouth daily.     dextromethorphan-guaiFENesin (MUCINEX DM) 30-600 MG 12hr tablet Take 1 tablet by mouth 2 (two) times daily.     EPINEPHrine 0.3 mg/0.3 mL IJ SOAJ injection Inject 0.3 mg into the muscle as needed for  anaphylaxis.     escitalopram (LEXAPRO) 10 MG tablet TAKE 1 TABLET(10 MG) BY MOUTH DAILY 90 tablet 3   ezetimibe (ZETIA) 10 MG tablet Take 10 mg by mouth 2 (two) times a week. Take on Tues & Fri     fluticasone (  FLONASE) 50 MCG/ACT nasal spray Place 1 spray into both nostrils every morning.     fluticasone-salmeterol (ADVAIR) 500-50 MCG/ACT AEPB Inhale 1 puff into the lungs in the morning and at bedtime.     Homeopathic Products (ARNICARE) GEL Apply 1 application  topically daily as needed (soreness).     hydrochlorothiazide (HYDRODIURIL) 25 MG tablet TAKE 2 TABLETS(50 MG) BY MOUTH DAILY 180 tablet 1   levalbuterol (XOPENEX HFA) 45 MCG/ACT inhaler Inhale 2 puffs into the lungs every 6 (six) hours as needed for wheezing or shortness of breath. 3 Inhaler 3   levothyroxine (SYNTHROID) 75 MCG tablet Take 1 tablet (75 mcg total) by mouth daily before breakfast. 90 tablet 1   metoprolol succinate (TOPROL-XL) 25 MG 24 hr tablet TAKE 1/2 TABLET(12.5 MG) BY MOUTH DAILY 15 tablet 0   mirtazapine (REMERON) 15 MG tablet Take 1 tablet (15 mg total) by mouth at bedtime. 30 tablet 2   montelukast (SINGULAIR) 10 MG tablet Take 1 tablet (10 mg total) by mouth at bedtime. 90 tablet 1   ondansetron (ZOFRAN) 4 MG tablet Take 1 tablet (4 mg total) by mouth every 8 (eight) hours as needed for nausea or vomiting. 40 tablet 3   pantoprazole (PROTONIX) 40 MG tablet Take 1 tablet (40 mg total) by mouth daily. 30 tablet 4   potassium chloride SA (KLOR-CON M) 20 MEQ tablet TAKE 1 TABLET(20 MEQ) BY MOUTH DAILY 90 tablet 3   Pyridoxine HCl (VITAMIN B-6 PO) Take 1 tablet by mouth daily.     [place] 420 MG/3.5ML SOCT INJECT 1 UNIT INTO THE SKIN EVERY 30 DAYS 3.5 mL 11   XARELTO 20 MG TABS tablet TAKE 1 TABLET(20 MG) BY MOUTH DAILY WITH SUPPER 90 tablet 1   ALLERGY RELIEF 180 MG tablet TAKE 1 TABLET BY MOUTH EVERY DAY 90 tablet 1   ALPRAZolam (XANAX) 0.5 MG tablet TAKE 1 TO 2 TABLETS BY MOUTH TWICE DAILY AS  NEEDED FOR ANXIETY OR SLEEP (Patient not taking: Reported on 02/12/2022) 60 tablet 2   Ascorbic Acid (VITAMIN C) 1000 MG tablet Take 1,000 mg by mouth in the morning. (Patient not taking: Reported on 02/12/2022)     B Complex Vitamins (VITAMIN B COMPLEX PO) Take 1 tablet by mouth daily. (Patient not taking: Reported on 12/26/2021)     Biotin 5000 MCG CAPS Take 5,000 mcg by mouth daily.  (Patient not taking: Reported on 12/26/2021)     cholecalciferol (VITAMIN D) 1000 UNITS tablet Take 1,000 Units by mouth daily. (Patient not taking: Reported on 12/26/2021)     Choline Dihydrogen Citrate (CHOLINE CITRATE PO) Take 1 capsule by mouth in the morning. (Patient not taking: Reported on 02/12/2022)     co-enzyme Q-10 50 MG capsule Take 100 mg by mouth daily. (Patient not taking: Reported on 02/12/2022)     colchicine 0.6 MG tablet Take 0.6 mg by mouth daily as needed (flare  up).  (Patient not taking: Reported on 02/12/2022)     cycloSPORINE (RESTASIS) 0.05 % ophthalmic emulsion Apply 1 drop to eye 2 (two) times daily as needed (allergies). (Patient not taking: Reported on 12/26/2021)     gabapentin (NEURONTIN) 300 MG capsule Take 1 capsule (300 mg total) by mouth 3 (three) times daily. (Patient not taking: Reported on 12/26/2021) 270 capsule 1   levocetirizine (XYZAL) 5 MG tablet Take 1 tablet (5 mg total) by mouth every evening. (Patient not taking: Reported on 02/12/2022) 30 tablet 11   Omega-3 Fatty Acids (FISH  OIL) 1000 MG CAPS Take 1 capsule by mouth daily. (Patient not taking: Reported on 02/12/2022)     TART CHERRY PO Take 1 capsule by mouth in the morning and at bedtime. (Patient not taking: Reported on 12/26/2021)     VITAMIN E PO Take 1 tablet by mouth daily. (Patient not taking: Reported on 12/26/2021)     Zinc 100 MG TABS Take 1 tablet by mouth in the morning. (Patient not taking: Reported on 12/26/2021)     No facility-administered medications prior to visit.        Objective:   Physical  Exam Vitals:   02/12/22 1002  BP: 104/68  Pulse: 85  Temp: 98 F (36.7 C)  TempSrc: Oral  SpO2: 95%  Weight: 222 lb (100.7 kg)  Height: $Remove'5\' 6"'LGXSBLO$  (1.676 m)   Gen: Pleasant, Obese woman, in no distress,  normal affect  ENT: No lesions,  mouth clear,  oropharynx clear, no active nasal drip  Neck: No JVD, improved.  No upper airway noise  Lungs: No use of accessory muscles, no apparent wheeze, no crackles.   Cardiovascular: Irregular, distant  Musculoskeletal: No deformities, no cyanosis or clubbing  Neuro: alert, non focal  Skin: Warm, no lesions or rashes      Assessment & Plan:  OSA on CPAP No longer uses CPAP. Not interested in reinitiating at this time.   Asthma Continue your Wixela twice a day.  Rinse and gargle after use. Keep albuterol available to use 2 puffs up to every 4 hours if needed for shortness of breath, chest tightness, wheezing.  Flu shot today Recommend that you get the COVID-19 vaccine this fall Recommend that you get the RSV vaccine this fall Follow with Dr Lamonte Sakai in 6 months or sooner if you have any problems  Allergic rhinitis Continue your Flonase and Astelin nasal sprays as you have been taking them. Continue Singulair 10 mg each evening Restart your fexofenadine 180 mg once daily, at least through the fall allergy season.   Cough Continue Protonix as you have been taking it. Continue allergy regimen   ILD (interstitial lung disease) ([place]) Post COVID.  Follow-up imaging 10/08/2021 overall stable interstitial changes.  Reassuring.  Time spent 44 minutes   Baltazar Apo, MD, PhD 02/12/2022, 11:53 AM [facility] [phone] or if no answer [phone]

## 2022-02-13 DIAGNOSIS — G4733 Obstructive sleep apnea (adult) (pediatric): Secondary | ICD-10-CM | POA: Diagnosis not present

## 2022-02-17 DIAGNOSIS — J9601 Acute respiratory failure with hypoxia: Secondary | ICD-10-CM | POA: Diagnosis not present

## 2022-02-17 DIAGNOSIS — J441 Chronic obstructive pulmonary disease with (acute) exacerbation: Secondary | ICD-10-CM | POA: Diagnosis not present

## 2022-02-25 DIAGNOSIS — Z1231 Encounter for screening mammogram for malignant neoplasm of breast: Secondary | ICD-10-CM | POA: Diagnosis not present

## 2022-02-25 LAB — HM MAMMOGRAPHY

## 2022-02-26 DIAGNOSIS — L570 Actinic keratosis: Secondary | ICD-10-CM | POA: Diagnosis not present

## 2022-02-26 DIAGNOSIS — L578 Other skin changes due to chronic exposure to nonionizing radiation: Secondary | ICD-10-CM | POA: Diagnosis not present

## 2022-02-27 DIAGNOSIS — M1991 Primary osteoarthritis, unspecified site: Secondary | ICD-10-CM | POA: Diagnosis not present

## 2022-02-27 DIAGNOSIS — M1009 Idiopathic gout, multiple sites: Secondary | ICD-10-CM | POA: Diagnosis not present

## 2022-02-27 DIAGNOSIS — Z79899 Other long term (current) drug therapy: Secondary | ICD-10-CM | POA: Diagnosis not present

## 2022-03-01 DIAGNOSIS — M170 Bilateral primary osteoarthritis of knee: Secondary | ICD-10-CM | POA: Diagnosis not present

## 2022-03-01 DIAGNOSIS — M1712 Unilateral primary osteoarthritis, left knee: Secondary | ICD-10-CM | POA: Diagnosis not present

## 2022-03-01 DIAGNOSIS — M1711 Unilateral primary osteoarthritis, right knee: Secondary | ICD-10-CM | POA: Diagnosis not present

## 2022-03-11 ENCOUNTER — Other Ambulatory Visit: Payer: Self-pay | Admitting: Gastroenterology

## 2022-03-11 ENCOUNTER — Other Ambulatory Visit: Payer: Self-pay | Admitting: Interventional Cardiology

## 2022-03-11 NOTE — Telephone Encounter (Signed)
Prescription refill request for Xarelto received.  Indication:afib Last office visit:2/23 Weight:100.7 kg Age:86 Scr:1.0 CrCl:66.57  ml/min  Prescription refilled

## 2022-03-12 ENCOUNTER — Other Ambulatory Visit: Payer: Self-pay | Admitting: Interventional Cardiology

## 2022-03-12 NOTE — Telephone Encounter (Signed)
Prescription refill request for Xarelto received.  Indication: AF Last office visit: 06/22/21  Lendell Caprice MD Weight: 122.3kg Age: 86 Scr: 1.00 on 12/26/21 CrCl: 80.85  Based on above findings Xarelto '20mg'$  daily is the appropriate dose.  Refill approved.

## 2022-03-19 ENCOUNTER — Encounter: Payer: Self-pay | Admitting: Gastroenterology

## 2022-03-20 DIAGNOSIS — J9601 Acute respiratory failure with hypoxia: Secondary | ICD-10-CM | POA: Diagnosis not present

## 2022-03-20 DIAGNOSIS — J441 Chronic obstructive pulmonary disease with (acute) exacerbation: Secondary | ICD-10-CM | POA: Diagnosis not present

## 2022-03-21 ENCOUNTER — Ambulatory Visit (INDEPENDENT_AMBULATORY_CARE_PROVIDER_SITE_OTHER): Payer: Medicare Other | Admitting: Gastroenterology

## 2022-03-21 ENCOUNTER — Encounter: Payer: Self-pay | Admitting: Gastroenterology

## 2022-03-21 ENCOUNTER — Other Ambulatory Visit (INDEPENDENT_AMBULATORY_CARE_PROVIDER_SITE_OTHER): Payer: Medicare Other

## 2022-03-21 VITALS — BP 118/76 | HR 77 | Ht 66.0 in | Wt 232.2 lb

## 2022-03-21 DIAGNOSIS — K7581 Nonalcoholic steatohepatitis (NASH): Secondary | ICD-10-CM | POA: Diagnosis not present

## 2022-03-21 DIAGNOSIS — K219 Gastro-esophageal reflux disease without esophagitis: Secondary | ICD-10-CM

## 2022-03-21 DIAGNOSIS — K7460 Unspecified cirrhosis of liver: Secondary | ICD-10-CM | POA: Diagnosis not present

## 2022-03-21 LAB — CBC WITH DIFFERENTIAL/PLATELET
Basophils Absolute: 0 10*3/uL (ref 0.0–0.1)
Basophils Relative: 0.6 % (ref 0.0–3.0)
Eosinophils Absolute: 0.1 10*3/uL (ref 0.0–0.7)
Eosinophils Relative: 2.5 % (ref 0.0–5.0)
HCT: 37.9 % (ref 36.0–46.0)
Hemoglobin: 12.9 g/dL (ref 12.0–15.0)
Lymphocytes Relative: 25.7 % (ref 12.0–46.0)
Lymphs Abs: 1.3 10*3/uL (ref 0.7–4.0)
MCHC: 34.1 g/dL (ref 30.0–36.0)
MCV: 101.1 fl — ABNORMAL HIGH (ref 78.0–100.0)
Monocytes Absolute: 0.6 10*3/uL (ref 0.1–1.0)
Monocytes Relative: 12.3 % — ABNORMAL HIGH (ref 3.0–12.0)
Neutro Abs: 3 10*3/uL (ref 1.4–7.7)
Neutrophils Relative %: 58.9 % (ref 43.0–77.0)
Platelets: 138 10*3/uL — ABNORMAL LOW (ref 150.0–400.0)
RBC: 3.75 Mil/uL — ABNORMAL LOW (ref 3.87–5.11)
RDW: 13.8 % (ref 11.5–15.5)
WBC: 5.1 10*3/uL (ref 4.0–10.5)

## 2022-03-21 LAB — COMPREHENSIVE METABOLIC PANEL
ALT: 12 U/L (ref 0–35)
AST: 22 U/L (ref 0–37)
Albumin: 3.9 g/dL (ref 3.5–5.2)
Alkaline Phosphatase: 70 U/L (ref 39–117)
BUN: 22 mg/dL (ref 6–23)
CO2: 31 mEq/L (ref 19–32)
Calcium: 9.4 mg/dL (ref 8.4–10.5)
Chloride: 105 mEq/L (ref 96–112)
Creatinine, Ser: 1.14 mg/dL (ref 0.40–1.20)
GFR: 44.19 mL/min — ABNORMAL LOW (ref >60.00)
Glucose, Bld: 97 mg/dL (ref 70–99)
Potassium: 3.2 mEq/L — ABNORMAL LOW (ref 3.5–5.1)
Sodium: 142 mEq/L (ref 135–145)
Total Bilirubin: 1.5 mg/dL — ABNORMAL HIGH (ref 0.2–1.2)
Total Protein: 6.5 g/dL (ref 6.0–8.3)

## 2022-03-21 LAB — PROTIME-INR
INR: 2.1 ratio — ABNORMAL HIGH (ref 0.8–1.0)
Prothrombin Time: 22 s — ABNORMAL HIGH (ref 9.6–13.1)

## 2022-03-21 MED ORDER — PANTOPRAZOLE SODIUM 40 MG PO TBEC: 40.0000 mg | DELAYED_RELEASE_TABLET | Freq: Every day | ORAL | 4 refills | Status: DC

## 2022-03-21 NOTE — Patient Instructions (Addendum)
_______________________________________________________  If you are age 84 or older, your body mass index should be between 23-30. Your Body mass index is 37.48 kg/m. If this is out of the aforementioned range listed, please consider follow up with your Primary Care Provider.  If you are age 31 or younger, your body mass index should be between 19-25. Your Body mass index is 37.48 kg/m. If this is out of the aformentioned range listed, please consider follow up with your Primary Care Provider.   ________________________________________________________  The [facility] providers would like to encourage you to use [facility] to communicate with providers for non-urgent requests or questions.  Due to long hold times on the telephone, sending your provider a message by [facility] may be a faster and more efficient way to get a response.  Please allow 48 business hours for a response.  Please remember that this is for non-urgent requests.  _______________________________________________________  Your provider has requested that you go to the basement level for lab work before leaving today. Press "B" on the elevator. The lab is located at the first door on the left as you exit the elevator.  We have sent the following medications to your pharmacy for you to pick up at your convenience: Protonix  Please follow up in 12 months. Give [place] a call at [phone] to schedule an appointment.  Avoid medications toxic to the liver.  Do a low salt diet Low-Sodium Eating Plan Sodium, which is an element that makes up salt, helps you maintain a healthy balance of fluids in your body. Too much sodium can increase your blood pressure and cause fluid and waste to be held in your body. Your health care provider or dietitian may recommend following this plan if you have high blood pressure (hypertension), kidney disease, liver disease, or heart failure. Eating less sodium can help lower your blood pressure, reduce  swelling, and protect your heart, liver, and kidneys. What are tips for following this plan? Reading food labels The Nutrition Facts label lists the amount of sodium in one serving of the food. If you eat more than one serving, you must multiply the listed amount of sodium by the number of servings. Choose foods with less than 140 mg of sodium per serving. Avoid foods with 300 mg of sodium or more per serving. Shopping  Look for lower-sodium products, often labeled as "low-sodium" or "no salt added." Always check the sodium content, even if foods are labeled as "unsalted" or "no salt added." Buy fresh foods. Avoid canned foods and pre-made or frozen meals. Avoid canned, cured, or processed meats. Buy breads that have less than 80 mg of sodium per slice. Cooking  Eat more home-cooked food and less restaurant, buffet, and fast food. Avoid adding salt when cooking. Use salt-free seasonings or herbs instead of table salt or sea salt. Check with your health care provider or pharmacist before using salt substitutes. Cook with plant-based oils, such as canola, sunflower, or olive oil. Meal planning When eating at a restaurant, ask that your food be prepared with less salt or no salt, if possible. Avoid dishes labeled as brined, pickled, cured, smoked, or made with soy sauce, miso, or teriyaki sauce. Avoid foods that contain MSG (monosodium glutamate). MSG is sometimes added to [place] food, bouillon, and some canned foods. Make meals that can be grilled, baked, poached, roasted, or steamed. These are generally made with less sodium. General information Most people on this plan should limit their sodium intake to 1,500-2,000 mg (milligrams)  of sodium each day. What foods should I eat? Fruits Fresh, frozen, or canned fruit. Fruit juice. Vegetables Fresh or frozen vegetables. "No salt added" canned vegetables. "No salt added" tomato sauce and paste. Low-sodium or reduced-sodium tomato and  vegetable juice. Grains Low-sodium cereals, including oats, puffed wheat and rice, and shredded wheat. Low-sodium crackers. Unsalted rice. Unsalted pasta. Low-sodium bread. Whole-grain breads and whole-grain pasta. Meats and other proteins Fresh or frozen (no salt added) meat, poultry, seafood, and fish. Low-sodium canned tuna and salmon. Unsalted nuts. Dried peas, beans, and lentils without added salt. Unsalted canned beans. Eggs. Unsalted nut butters. Dairy Milk. Soy milk. Cheese that is naturally low in sodium, such as ricotta cheese, fresh mozzarella, or Swiss cheese. Low-sodium or reduced-sodium cheese. Cream cheese. Yogurt. Seasonings and condiments Fresh and dried herbs and spices. Salt-free seasonings. Low-sodium mustard and ketchup. Sodium-free salad dressing. Sodium-free light mayonnaise. Fresh or refrigerated horseradish. Lemon juice. Vinegar. Other foods Homemade, reduced-sodium, or low-sodium soups. Unsalted popcorn and pretzels. Low-salt or salt-free chips. The items listed above may not be a complete list of foods and beverages you can eat. Contact a dietitian for more information. What foods should I avoid? Vegetables Sauerkraut, pickled vegetables, and relishes. Olives. [place] fries. Onion rings. Regular canned vegetables (not low-sodium or reduced-sodium). Regular canned tomato sauce and paste (not low-sodium or reduced-sodium). Regular tomato and vegetable juice (not low-sodium or reduced-sodium). Frozen vegetables in sauces. Grains Instant hot cereals. Bread stuffing, pancake, and biscuit mixes. Croutons. Seasoned rice or pasta mixes. Noodle soup cups. Boxed or frozen macaroni and cheese. Regular salted crackers. Self-rising flour. Meats and other proteins Meat or fish that is salted, canned, smoked, spiced, or pickled. Precooked or cured meat, such as sausages or meat loaves. Berniece Salines. Ham. Pepperoni. Hot dogs. Corned beef. Chipped beef. Salt pork. Jerky. Pickled herring.  Anchovies and sardines. Regular canned tuna. Salted nuts. Dairy Processed cheese and cheese spreads. Hard cheeses. Cheese curds. Blue cheese. Feta cheese. String cheese. Regular cottage cheese. Buttermilk. Canned milk. Fats and oils Salted butter. Regular margarine. Ghee. Bacon fat. Seasonings and condiments Onion salt, garlic salt, seasoned salt, table salt, and sea salt. Canned and packaged gravies. Worcestershire sauce. Tartar sauce. Barbecue sauce. Teriyaki sauce. Soy sauce, including reduced-sodium. Steak sauce. Fish sauce. Oyster sauce. Cocktail sauce. Horseradish that you find on the shelf. Regular ketchup and mustard. Meat flavorings and tenderizers. Bouillon cubes. Hot sauce. Pre-made or packaged marinades. Pre-made or packaged taco seasonings. Relishes. Regular salad dressings. Salsa. Other foods Salted popcorn and pretzels. Corn chips and puffs. Potato and tortilla chips. Canned or dried soups. Pizza. Frozen entrees and pot pies. The items listed above may not be a complete list of foods and beverages you should avoid. Contact a dietitian for more information. Summary Eating less sodium can help lower your blood pressure, reduce swelling, and protect your heart, liver, and kidneys. Most people on this plan should limit their sodium intake to 1,500-2,000 mg (milligrams) of sodium each day. Canned, boxed, and frozen foods are high in sodium. Restaurant foods, fast foods, and pizza are also very high in sodium. You also get sodium by adding salt to food. Try to cook at home, eat more fresh fruits and vegetables, and eat less fast food and canned, processed, or prepared foods. This information is not intended to replace advice given to you by your health care provider. Make sure you discuss any questions you have with your health care provider. Document Revised: 05/28/2019 Document Reviewed: 03/24/2019 Elsevier Patient Education  2023  [employer].

## 2022-03-21 NOTE — Progress Notes (Signed)
Chief Complaint: FU  Referring Provider:  Biagio Borg, MD      ASSESSMENT AND PLAN;   #1. 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 She does have pulm HTN without overt RHF. No hepatic encephalopathy, EV or ascites (on imaging).  #2. GERD  Plan: -CBC, CMP, PT INR. -protonix 23m po QD #90, 4RF -Low salt diet. -Avoid hepatotoxic meds. -FU in 1 year.   HPI:    Mercedes ORTNERis a 86y.o. female  With H/O asthma, COPD on home O2 (nightly),  OSA off CPAP now (followed by Dr. OElenore Rota, CAD, CHF (Nl EF 60-65%), hypertension, sinus node dysfunction, A-fib on Xarelto, hypothyroid, peripheral neuropathy, osteoarthritis, stage Ia left breast cancer 2012, HLD, obesity, gout, prediabetes.  FU NASH cirrhosis. Had sudden increase in LFTs, better since all of the supplements have been stopped.   Feels "100% better" "Previous AFannyis back"  She has good appetite.  Has easy bruisability and multiple bruises d/t Xarelto.  No change in mental status.  No nausea, vomiting, heartburn, regurgitation, odynophagia or dysphagia.  No significant diarrhea or constipation.  No melena or hematochezia. No abdominal pain.  No further weight loss.   She is compliant with low sodium diet.  No nausea, vomiting, heartburn, regurgitation, odynophagia or dysphagia (had in past, ba swallow showed esophageal dysmotility).  No significant diarrhea.  No melena or hematochezia. No unintentional weight loss. No abdominal pain.  MELD 3.0: 16 at 12/26/2021  9:16 AM MELD-Na: 16 at 12/26/2021  9:16 AM Calculated from: Serum Creatinine: 1.00 mg/dL at 12/26/2021  9:16 AM Serum Sodium: 140 mEq/L (Using max of 137 mEq/L) at 12/26/2021  9:16 AM Total Bilirubin: 1.9 mg/dL at 12/26/2021  9:16 AM Serum Albumin: 3.6 g/dL (Using max of 3.5 g/dL) at 12/26/2021  9:16 AM INR(ratio): 1.9 ratio at 12/26/2021  9:16 AM Age at listing (hypothetical): 86 years Sex: Female at 12/26/2021  9:16 AM       Latest Ref Rng & Units  12/26/2021    9:16 AM 12/12/2021   10:25 AM 12/06/2021    1:38 PM  Hepatic Function  Total Protein 6.0 - 8.3 g/dL 6.8  8.1  8.2   Albumin 3.5 - 5.2 g/dL 3.6  3.8  3.7   AST 0 - 37 U/L 36  242  470   ALT 0 - 35 U/L 36  219  381   Alk Phosphatase 39 - 117 U/L 57  106  133   Total Bilirubin 0.2 - 1.2 mg/dL 1.9  1.6  2.0      Wt Readings from Last 3 Encounters:  03/21/22 232 lb 3.2 oz (105.3 kg)  02/12/22 222 lb (100.7 kg)  12/26/21 225 lb (102.1 kg)      Previous GI work-up:  MRCP 12/05/2021 1. Hepatic morphologic changes as can be seen with given diagnosis of cirrhosis. No suspicious hepatic lesion. 2. Prominent periportal and portacaval lymph nodes are favored reactive in the setting of cirrhosis. 3. Small hiatal hernia.  [place] Doppler: Cirrhosis with normal hepatic venous Doppler.  Never had EGD/colonoscopy.  Does not want one  CT AP 11/05/2021 1. Cirrhosis with no portal hypertension. No focal liver lesions identified . Please note that liver protocol enhanced MR and CT are the most sensitive tests for the screening detection of hepatocellular carcinoma in the high risk setting of cirrhosis. 2.  Aortic Atherosclerosis (ICD10-I70.0). 3.  Small hiatal hernia.  Ba swallow 06/29/2021 IMPRESSION: 1. Limited mobility exam.  2. No esophageal stricture or mass.  No obstruction. 3. Mild esophageal dysmotility.  2DE 06/2021: EF 60 to 65%.  LVH.  Unable to determine diastolic function.  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  [name] is the other daughter.  Past Medical History:  Diagnosis Date   Allergy    Anxiety    Arthritis    no cartlidge in knees   Asthma    Bradycardia    a. atenolol stopped due to this, HR 40s.   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  CAD s/p RCA stent 2000 with residual mild-mod  disease of left system 2001.   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   Osteoporosis 03/24/2015   Peripheral neuropathy 03/24/2015   Plantar fasciitis    Sleep apnea     Past Surgical History:  Procedure Laterality Date   BREAST LUMPECTOMY W/ NEEDLE LOCALIZATION  01/29/2011   Left with [facility] Dr Streck   CHOLECYSTECTOMY  1955   CORONARY ANGIOPLASTY WITH STENT PLACEMENT  2000   DILATION AND CURETTAGE OF UTERUS  1976   and 1996   HAND SURGERY  1992   tendons between thumb and forefinger   SKIN BIOPSY     1994, 2006, 2008, 2010, 2011, 2011, 2012-  pre-cancerous   TONSILLECTOMY  1955    Family History  Problem Relation Age of Onset   Heart disease Father    Heart attack Father    Rectal cancer Father    Asthma Sister    Heart disease Brother    Heart attack Brother    Stomach cancer Neg Hx    Colon cancer Neg Hx     Social History   Tobacco Use   Smoking status: Former    Packs/day: 1.00    Years: 4.00    Total pack years: 4.00    Types: Cigarettes    Quit date: 12/05/1962    Years since quitting: 59.3   Smokeless tobacco: Never  Vaping Use   Vaping Use: Never used  Substance Use Topics   Alcohol use: No   Drug use: No    Current Outpatient Medications  Medication Sig Dispense Refill   albuterol (PROVENTIL) (2.5 MG/3ML) 0.083% nebulizer solution Take 3 mLs (2.5 mg total) by nebulization every 6 (six) hours as needed for wheezing or shortness of breath. 75 mL 12   allopurinol (ZYLOPRIM) 300 MG tablet Take 300 mg by mouth daily.     ALPRAZolam (XANAX) 0.5 MG tablet TAKE 1 TO 2 TABLETS BY MOUTH TWICE DAILY AS NEEDED FOR ANXIETY OR SLEEP 60 tablet 2    AMBULATORY NON FORMULARY MEDICATION Medication Name: Prednisone Injection in knee     azelastine (ASTELIN) 0.1 % nasal spray Place 2 sprays into both nostrils daily. Use in each nostril as directed (Patient taking differently: Place 2 sprays into both nostrils in the morning.) 30 mL 5   co-enzyme Q-10 50 MG capsule Take   100 mg by mouth daily.     colchicine 0.6 MG tablet Take 0.6 mg by mouth daily as needed (flare  up).     cycloSPORINE (RESTASIS) 0.05 % ophthalmic emulsion Apply 1 drop to eye 2 (two) times daily as needed (allergies).     dextromethorphan-guaiFENesin (MUCINEX DM) 30-600 MG 12hr tablet Take 1 tablet by mouth 2 (two) times daily.     EPINEPHrine 0.3 mg/0.3 mL IJ SOAJ injection Inject 0.3 mg into the muscle as needed for anaphylaxis.     escitalopram (LEXAPRO) 10 MG tablet TAKE 1 TABLET(10 MG) BY MOUTH DAILY 90 tablet 3   ezetimibe (ZETIA) 10 MG tablet Take 10 mg by mouth 2 (two) times a week. Take on Tues & Fri     fexofenadine (ALLERGY RELIEF) 180 MG tablet Take 1 tablet (180 mg total) by mouth daily. 90 tablet 5   fluticasone (FLONASE) 50 MCG/ACT nasal spray Place 1 spray into both nostrils every morning.     fluticasone-salmeterol (ADVAIR) 500-50 MCG/ACT AEPB Inhale 1 puff into the lungs in the morning and at bedtime.     gabapentin (NEURONTIN) 300 MG capsule Take 1 capsule (300 mg total) by mouth 3 (three) times daily. 270 capsule 1   Homeopathic Products (ARNICARE) GEL Apply 1 application  topically daily as needed (soreness).     hydrochlorothiazide (HYDRODIURIL) 25 MG tablet TAKE 2 TABLETS(50 MG) BY MOUTH DAILY 180 tablet 1   levalbuterol (XOPENEX HFA) 45 MCG/ACT inhaler Inhale 2 puffs into the lungs every 6 (six) hours as needed for wheezing or shortness of breath. 3 Inhaler 3   levocetirizine (XYZAL) 5 MG tablet Take 1 tablet (5 mg total) by mouth every evening. 30 tablet 11   levothyroxine (SYNTHROID) 75 MCG tablet Take 1 tablet (75 mcg total) by mouth daily before  breakfast. 90 tablet 1   metoprolol succinate (TOPROL-XL) 25 MG 24 hr tablet TAKE 1/2 TABLET(12.5 MG) BY MOUTH DAILY 60 tablet 0   mirtazapine (REMERON) 15 MG tablet Take 1 tablet (15 mg total) by mouth at bedtime. 30 tablet 2   montelukast (SINGULAIR) 10 MG tablet Take 1 tablet (10 mg total) by mouth at bedtime. 90 tablet 1   Omega-3 Fatty Acids (FISH OIL) 1000 MG CAPS Take 1 capsule by mouth daily.     ondansetron (ZOFRAN) 4 MG tablet Take 1 tablet (4 mg total) by mouth every 8 (eight) hours as needed for nausea or vomiting. 40 tablet 3   pantoprazole (PROTONIX) 40 MG tablet Take 1 tablet (40 mg total) by mouth daily. 30 tablet 4   potassium chloride SA (KLOR-CON M) 20 MEQ tablet TAKE 1 TABLET(20 MEQ) BY MOUTH DAILY 90 tablet 3   Pyridoxine HCl (VITAMIN B-6 PO) Take 1 tablet by mouth daily.     REPATHA PUSHTRONEX SYSTEM 420 MG/3.5ML SOCT INJECT 1 UNIT INTO THE SKIN EVERY 30 DAYS 3.5 mL 11   XARELTO 20 MG TABS tablet TAKE 1 TABLET(20 MG) BY MOUTH DAILY WITH SUPPER 90 tablet 1   Ascorbic Acid (VITAMIN C) 1000 MG tablet Take 1,000 mg by mouth in the morning. (Patient not taking: Reported on 02/12/2022)     atorvastatin (LIPITOR) 20 MG tablet Take 1 tablet (20 mg total) by mouth daily. (Patient not taking: Reported on 03/21/2022) 90 tablet 3   B Complex Vitamins (VITAMIN B COMPLEX PO) Take 1 tablet by mouth daily. (Patient not taking: Reported on 12/26/2021)     Biotin 5000 MCG CAPS Take 5,000 mcg by mouth daily.  (  Patient not taking: Reported on 12/26/2021)     cholecalciferol (VITAMIN D) 1000 UNITS tablet Take 1,000 Units by mouth daily. (Patient not taking: Reported on 12/26/2021)     Choline Dihydrogen Citrate (CHOLINE CITRATE PO) Take 1 capsule by mouth in the morning. (Patient not taking: Reported on 02/12/2022)     Cyanocobalamin (VITAMIN B-12 PO) Take 1 tablet by mouth daily. (Patient not taking: Reported on 03/21/2022)     TART CHERRY PO Take 1 capsule by mouth in the morning and at bedtime.  (Patient not taking: Reported on 12/26/2021)     VITAMIN E PO Take 1 tablet by mouth daily. (Patient not taking: Reported on 12/26/2021)     Zinc 100 MG TABS Take 1 tablet by mouth in the morning. (Patient not taking: Reported on 12/26/2021)     No current facility-administered medications for this visit.    Allergies  Allergen Reactions   Crestor [Rosuvastatin Calcium]     Severe liver function problems   Hydrocodone-Acetaminophen Anxiety and Itching   Lidocaine Hcl Other (See Comments)    Novacaine:  Becomes very shaky   Lipitor [Atorvastatin Calcium] Other (See Comments)    Elevated liver function    Procaine Other (See Comments)    shaking   Rosuvastatin Other (See Comments)    Severe liver function problems   Theophylline Other (See Comments)    Becomes very shaky skaking skaking   Vicodin [Hydrocodone-Acetaminophen] Itching    Itching all over   Codeine Nausea Only, Anxiety and Other (See Comments)    Also complained of dizziness.  Has same problems with oxycodone and hydrocodone Visual Disturbance, Balance Difficulty, Dizzy "Room Spinning; "Swimming" Balance, vision, nausea, dizzy, "swimming", "room spinning" Balance, vision, nausea, dizzy, "swimming", "room spinning"   Ephedrine Other (See Comments)    shaking   Other Other (See Comments)    Quinine causes nausea, dizzy   Oxycodone Other (See Comments)    Balance, vision, nausea, dizzy, "swimming", "room spinning"   Oxycodone-Acetaminophen Nausea Only, Anxiety and Other (See Comments)    Visual Disturbance, Balance Difficulty, Dizzy "Room Spinning; "Swimming"   Propoxyphene Anxiety, Nausea Only and Other (See Comments)    Visual Disturbance, Balance Difficulty, Dizzy "Room Spinning; "Swimming" Balance, vision, nausea, dizzy, "swimming", "room spinning" Balance, vision, nausea, dizzy, "swimming, "room spinning"   Quinine Derivatives Nausea Only    dizzy   Sulfa Antibiotics Nausea Only    Also complained of dizziness    Epinephrine Other (See Comments)    Patient becomes "shaky" shaking shaking   Penicillins Rash    Pt reported all over body rash in 1958 Has patient had a PCN reaction causing immediate rash, facial/tongue/throat swelling, SOB or lightheadedness with hypotension:Yes Has patient had a PCN reaction causing severe rash involving mucus membranes or skin necrosis: No Has patient had a PCN reaction that required hospitalization: No Has patient had a PCN reaction occurring within the last 10 years:No     Quinine Other (See Comments)   Theophyllines Other (See Comments)    Patient becomes "shaky"    Review of Systems:  Situational anxiety.     Physical Exam:    BP 118/76   Pulse 77   Ht 5' 6" (1.676 m)   Wt 232 lb 3.2 oz (105.3 kg)   SpO2 97%   BMI 37.48 kg/m  Wt Readings from Last 3 Encounters:  03/21/22 232 lb 3.2 oz (105.3 kg)  02/12/22 222 lb (100.7 kg)  12/26/21 225 lb (102.1 kg)     Constitutional:  Well-developed, in no acute distress. Psychiatric: Normal mood and affect. Behavior is normal. HEENT: Pupils normal.  Conjunctivae are normal. + scleral icterus. Cardiovascular: Normal rate, regular rhythm. No edema Pulmonary/chest: Effort normal and breath sounds normal. No wheezing, rales or rhonchi. Abdominal: Soft, nondistended. Nontender. Bowel sounds active throughout. There are no masses palpable. No hepatomegaly. Rectal: Deferred Neurological: Alert and oriented to person place and time. Skin: Skin is warm and dry. No rashes noted.  Data Reviewed: I have personally reviewed following labs and imaging studies  CBC:    Latest Ref Rng & Units 12/26/2021    9:16 AM 12/12/2021   10:25 AM 12/06/2021    1:38 PM  CBC  WBC 4.0 - 10.5 K/uL 6.1  5.0  4.6   Hemoglobin 12.0 - 15.0 g/dL 13.4  14.2  13.7   Hematocrit 36.0 - 46.0 % 40.4  43.2  41.9   Platelets 150.0 - 400.0 K/uL 136.0  153.0  151.0     CMP:    Latest Ref Rng & Units 12/26/2021    9:16 AM 12/12/2021   10:25  AM 12/06/2021    1:38 PM  CMP  Glucose 70 - 99 mg/dL 101  98  95   BUN 6 - 23 mg/dL _0 Creatinine 0.40 - 1.20 mg/dL 1.00  1.12  1.09   Sodium 135 - 145 mEq/L 140  141  138   Potassium 3.5 - 5.1 mEq/L 3.3  3.4  3.3   Chloride 96 - 112 mEq/L 106  105  103   CO2 19 - 32 mEq/L _1 Calcium 8.4 - 10.5 mg/dL 9.7  10.0  10.2   Total Protein 6.0 - 8.3 g/dL 6.8  8.1  8.2   Total Bilirubin 0.2 - 1.2 mg/dL 1.9  1.6  2.0   Alkaline Phos 39 - 117 U/L 57  106  133   AST 0 - 37 U/L 36  242  470   ALT 0 - 35 U/L 36  219  381       Latest Ref Rng & Units 12/26/2021    9:16 AM 12/12/2021   10:25 AM 12/06/2021    1:38 PM  Hepatic Function  Total Protein 6.0 - 8.3 g/dL 6.8  8.1  8.2   Albumin 3.5 - 5.2 g/dL 3.6  3.8  3.7   AST 0 - 37 U/L 36  242  470   ALT 0 - 35 U/L 36  219  381   Alk Phosphatase 39 - 117 U/L 57  106  133   Total Bilirubin 0.2 - 1.2 mg/dL 1.9  1.6  2.0       Carmell Austria, MD 03/21/2022, 11:36 AM

## 2022-03-26 ENCOUNTER — Other Ambulatory Visit: Payer: Self-pay

## 2022-03-26 DIAGNOSIS — R791 Abnormal coagulation profile: Secondary | ICD-10-CM

## 2022-03-26 DIAGNOSIS — K7460 Unspecified cirrhosis of liver: Secondary | ICD-10-CM

## 2022-03-26 MED ORDER — PHYTONADIONE 5 MG PO TABS: 10.0000 mg | ORAL_TABLET | Freq: Every day | ORAL | 0 refills | Status: AC

## 2022-04-01 DIAGNOSIS — M170 Bilateral primary osteoarthritis of knee: Secondary | ICD-10-CM | POA: Diagnosis not present

## 2022-04-01 DIAGNOSIS — M1712 Unilateral primary osteoarthritis, left knee: Secondary | ICD-10-CM | POA: Diagnosis not present

## 2022-04-01 DIAGNOSIS — M1711 Unilateral primary osteoarthritis, right knee: Secondary | ICD-10-CM | POA: Diagnosis not present

## 2022-04-01 DIAGNOSIS — M25522 Pain in left elbow: Secondary | ICD-10-CM | POA: Diagnosis not present

## 2022-04-11 ENCOUNTER — Other Ambulatory Visit: Payer: Self-pay

## 2022-04-11 ENCOUNTER — Telehealth: Payer: Self-pay | Admitting: Emergency Medicine

## 2022-04-11 MED ORDER — POTASSIUM CHLORIDE CRYS ER 20 MEQ PO TBCR: EXTENDED_RELEASE_TABLET | ORAL | 3 refills | Status: DC

## 2022-04-11 NOTE — Telephone Encounter (Signed)
The best way to establish whether she needs to continue to use and have oxygen is to arrange for a walking oximetry, either by [place] or by her DME. That is my recommendation. If she wants her O2 stopped no matter whether we recommend it, then you can write an order to discontinue. Just note that it was patient's preference.

## 2022-04-11 NOTE — Telephone Encounter (Signed)
Called patient and she states that she is no using her oxygen anymore. She states that she wants it picked up and moved from her house and for her to stop having to pay for it.   Dr Lamonte Sakai are you ok with me placing a d/c order on oxygen.   Please advise sir

## 2022-04-12 NOTE — Telephone Encounter (Signed)
Called and left voicemail for patient to call office back when she has a moment to go over her oxygen.

## 2022-04-16 DIAGNOSIS — J441 Chronic obstructive pulmonary disease with (acute) exacerbation: Secondary | ICD-10-CM | POA: Diagnosis not present

## 2022-04-16 DIAGNOSIS — G4733 Obstructive sleep apnea (adult) (pediatric): Secondary | ICD-10-CM | POA: Diagnosis not present

## 2022-04-19 ENCOUNTER — Encounter (HOSPITAL_COMMUNITY): Payer: Self-pay

## 2022-04-19 ENCOUNTER — Ambulatory Visit (HOSPITAL_COMMUNITY)
Admission: EM | Admit: 2022-04-19 | Discharge: 2022-04-19 | Disposition: A | Discharge status: Home / Self Care | Payer: Medicare Other | Attending: Physician Assistant | Admitting: Physician Assistant

## 2022-04-19 DIAGNOSIS — Z23 Encounter for immunization: Secondary | ICD-10-CM

## 2022-04-19 DIAGNOSIS — S81811A Laceration without foreign body, right lower leg, initial encounter: Secondary | ICD-10-CM

## 2022-04-19 DIAGNOSIS — J441 Chronic obstructive pulmonary disease with (acute) exacerbation: Secondary | ICD-10-CM | POA: Diagnosis not present

## 2022-04-19 DIAGNOSIS — J9601 Acute respiratory failure with hypoxia: Secondary | ICD-10-CM | POA: Diagnosis not present

## 2022-04-19 MED ORDER — TETANUS-DIPHTH-ACELL PERTUSSIS 5-2.5-18.5 LF-MCG/0.5 IM SUSY
0.5000 mL | PREFILLED_SYRINGE | Freq: Once | INTRAMUSCULAR | Status: AC
  Administered 2022-04-19: 0.5 mL via INTRAMUSCULAR

## 2022-04-19 MED ORDER — TETANUS-DIPHTH-ACELL PERTUSSIS 5-2.5-18.5 LF-MCG/0.5 IM SUSY
PREFILLED_SYRINGE | INTRAMUSCULAR | Status: AC
  Filled 2022-04-19: qty 0.5

## 2022-04-19 NOTE — ED Provider Notes (Signed)
[place]    CSN: [id number] Arrival date & time: 04/19/22  1243      History   Chief Complaint Chief Complaint  Patient presents with   Laceration    HPI [name] is a 86 y.o. female.   Patient here today for evaluation of laceration to right lower leg that occurred earlier this afternoon when she accidentally hit her leg on a car door.  Patient takes xarelto and she states that she did have bleeding but this has improved with time and dressing.  She has not had any numbness or tingling.  She denies any significant pain associated with wound.  She is unsure when her last tetanus vaccine was administered but believes it was greater than 10 years patch.   The history is provided by the patient.  Laceration Associated symptoms: no fever     Past Medical History:  Diagnosis Date   Allergy    Anxiety    Arthritis    no cartlidge in knees   Asthma    Bradycardia    a. atenolol stopped due to this, HR 40s.   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  CAD s/p RCA stent 2000 with residual mild-mod disease of left system 2001.   Chronic atrial fibrillation (HCC)    COPD (chronic obstructive pulmonary disease) ([place])    Essential hypertension 03/24/2015   Gout    post operative   Hx of radiation therapy 04/02/11 -05/20/11   left breast   Hyperlipidemia    Hypothyroidism 03/24/2015   Incontinence of urine    Malignant neoplasm of upper-outer quadrant of female breast ([place]) 12/26/2010   Osteoporosis 03/24/2015   Peripheral neuropathy 03/24/2015   Plantar fasciitis    Sleep apnea     Patient Active Problem List   Diagnosis Date Noted   Pulmonary hypertension ([place]) 11/07/2021   Bronchiectasis without complication ([place]) 77/41/2878   Hepatic cirrhosis ([place]) 11/06/2021   ILD (interstitial lung disease) ([place]) 10/03/2021   Abnormal LFTs 09/28/2021   Weight loss 08/19/2021   Chronic respiratory failure with hypoxia  ([place]) 08/19/2021   Debility 06/29/2021   Pneumonia 06/26/2021   Multifocal pneumonia 06/15/2021   Elevated troponin 06/12/2021   Febrile illness 05/22/2021   Anxiety state 10/23/2020   Pure hypercholesterolemia 10/23/2020   Urinary tract infectious disease 10/23/2020   Atherosclerosis of coronary artery 10/23/2020   Sinus node dysfunction ([place]) 10/23/2020   Mixed hyperlipidemia 10/23/2020   OSA on CPAP 10/23/2020   Fall 09/05/2020   Nausea 04/23/2020   Constipation 04/23/2020   Posterior uveitis, 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   Dysphagia 01/21/2020   CHF (congestive heart failure) ([place]) 11/23/2019   Exposure to COVID-19 virus 12/04/2018   Osteoarthritis 09/19/2018   Abscess of left leg 09/26/2017   Left leg cellulitis 09/18/2017   Whiplash 06/10/2017   Cough 05/29/2017   Concussion 05/29/2017   Hammer toes of both feet 12/19/2016   Allergic rhinitis 12/09/2016   Pedal edema 09/17/2016   Chest pain 07/18/2016   Gross hematuria 04/26/2016   Prediabetes 03/21/2016   Dysuria 12/07/2015   Fever blister 12/07/2015   Choroidal nevus, 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   Malignant neoplasm of left female breast ([place]) 10/10/2015   Anxiety and depression 06/30/2015   Encounter for well adult exam with abnormal findings 06/30/2015   Neck mass 03/24/2015   Hypothyroidism 03/24/2015   Gout 03/24/2015   Essential hypertension 03/24/2015  Asthma 03/24/2015   Peripheral neuropathy 03/24/2015   Osteoporosis 03/24/2015   Morbid obesity ([place]) 03/24/2015   Long term current use of anticoagulant therapy 03/16/2015   Lymphadenopathy, cervical 03/16/2015   Diarrhea 03/16/2015   Hyperbilirubinemia 03/16/2015   Coronary arteriosclerosis 09/22/2014   Hyperlipidemia 07/23/2013   Hypokalemia 02/18/2013   Elevated bilirubin 02/18/2013   Morbid obesity with BMI of 45.0-49.9, adult ([place]) 10/29/2012   Rotator cuff tear arthropathy 10/29/2012   DJD (degenerative joint disease) of knee 10/29/2012   Plantar  fasciitis    Arthritis    Hx of radiation therapy    Atrial fibrillation, chronic ([place]) 01/21/2011   Malignant neoplasm of upper-outer quadrant of left breast in female, estrogen receptor positive ([place]) 12/26/2010   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      Home Medications    Prior to Admission medications   Medication Sig Start Date End Date Taking? Authorizing Provider  albuterol (PROVENTIL) (2.5 MG/3ML) 0.083% nebulizer solution Take 3 mLs (2.5 mg total) by nebulization every 6 (six) hours as needed for wheezing or shortness of breath. 10/12/21   Cobb, Karie Schwalbe, NP  allopurinol (ZYLOPRIM) 300 MG tablet Take 300 mg by mouth daily.    [provider]  ALPRAZolam Duanne Moron) 0.5 MG tablet TAKE 1 TO 2 TABLETS BY MOUTH TWICE DAILY AS NEEDED FOR ANXIETY OR SLEEP 04/25/21   Biagio Borg, MD  AMBULATORY NON FORMULARY MEDICATION Medication Name: Prednisone Injection in knee    [provider]  Ascorbic Acid (VITAMIN C) 1000 MG tablet Take 1,000 mg by mouth in the morning. Patient not taking: Reported on 02/12/2022    [provider]  atorvastatin (LIPITOR) 20 MG tablet Take 1 tablet (20 mg total) by mouth daily. Patient not taking: Reported on 03/21/2022 07/24/21 07/24/22  Biagio Borg, MD  azelastine (ASTELIN) 0.1 % nasal spray Place 2 sprays into both nostrils daily. Use in each nostril as directed Patient taking differently: Place 2 sprays into both nostrils in the morning. 07/14/19    Biagio Borg, MD  B Complex Vitamins (VITAMIN B COMPLEX PO) Take 1 tablet by mouth daily. Patient not taking: Reported on 12/26/2021    [provider]  Biotin 5000 MCG CAPS Take 5,000 mcg by mouth daily.  Patient not taking: Reported on 12/26/2021    [provider]  cholecalciferol (VITAMIN D) 1000 UNITS tablet Take 1,000 Units by mouth daily. Patient not taking: Reported on 12/26/2021    [provider]  Choline Dihydrogen Citrate (CHOLINE CITRATE PO) Take 1 capsule by mouth in the morning. Patient not taking: Reported on 02/12/2022    [provider]  co-enzyme Q-10 50 MG capsule Take 100 mg by mouth daily.    [provider]  colchicine 0.6 MG tablet Take 0.6 mg by mouth daily as needed (flare  up).    [provider]  Cyanocobalamin (VITAMIN  B-12 PO) Take 1 tablet by mouth daily. Patient not taking: Reported on 03/21/2022    [provider]  cycloSPORINE (RESTASIS) 0.05 % ophthalmic emulsion Apply 1 drop to eye 2 (two) times daily as needed (allergies).    [provider]  dextromethorphan-guaiFENesin (MUCINEX DM) 30-600 MG 12hr tablet Take 1 tablet by mouth 2 (two) times daily.    [provider]  EPINEPHrine 0.3 mg/0.3 mL IJ SOAJ injection Inject 0.3 mg into the muscle as needed for anaphylaxis. 12/29/19   [provider]  escitalopram (LEXAPRO) 10 MG tablet TAKE 1 TABLET(10 MG) BY MOUTH DAILY 08/22/21   Biagio Borg, MD  ezetimibe (ZETIA) 10 MG tablet Take 10 mg by mouth 2 (two) times a week. Take on Tues & Fri    [provider]  fexofenadine (ALLERGY RELIEF) 180 MG tablet Take 1 tablet (180 mg total) by mouth daily. 02/12/22   Collene Gobble, MD  fluticasone (FLONASE) 50 MCG/ACT nasal spray Place 1 spray into both nostrils every morning.    [provider]  fluticasone-salmeterol (ADVAIR) 500-50 MCG/ACT AEPB Inhale 1 puff into the lungs in the morning and at bedtime.    [provider]  gabapentin (NEURONTIN) 300 MG capsule Take 1 capsule (300 mg total) by mouth 3 (three) times daily. 12/17/18   Biagio Borg, MD  Homeopathic Products (ARNICARE) GEL Apply 1 application  topically daily as needed (soreness).    [provider]  hydrochlorothiazide (HYDRODIURIL) 25 MG tablet TAKE 2 TABLETS(50 MG) BY MOUTH DAILY 02/13/22   Biagio Borg, MD  levalbuterol [facility] HFA) 45 MCG/ACT inhaler Inhale 2 puffs into the lungs every 6 (six) hours as needed for wheezing or shortness of breath. 09/30/19   Biagio Borg, MD  levocetirizine (XYZAL) 5 MG tablet Take 1 tablet (5 mg total) by mouth every evening. 06/24/19   Biagio Borg, MD  levothyroxine (SYNTHROID) 75 MCG tablet Take 1 tablet (75 mcg total) by mouth daily before breakfast. 09/28/21   Biagio Borg, MD  metoprolol succinate (TOPROL-XL) 25 MG 24 hr tablet TAKE 1/2 TABLET(12.5 MG) BY MOUTH DAILY 03/11/22   Jettie Booze, MD  mirtazapine (REMERON) 15 MG tablet Take 1 tablet (15 mg total) by mouth at bedtime. 12/10/21   Vladimir Crofts, PA-C  montelukast (SINGULAIR) 10 MG tablet Take 1 tablet (10 mg total) by mouth at bedtime. 07/01/21   Pokhrel, Corrie Mckusick, MD  Omega-3 Fatty Acids (FISH OIL) 1000 MG CAPS Take 1 capsule by mouth daily.    [provider]  ondansetron (ZOFRAN) 4 MG tablet Take 1 tablet (4 mg total) by mouth every 8 (eight) hours as needed for nausea or vomiting. 08/30/20   Biagio Borg, MD  pantoprazole (PROTONIX) 40 MG tablet Take 1 tablet (40 mg total) by mouth daily. 12/26/21   Jackquline Denmark, MD  pantoprazole (PROTONIX) 40 MG tablet Take 1 tablet (40 mg total) by mouth daily. 03/21/22   Jackquline [place], MD  potassium chloride SA (KLOR-CON M) 20 MEQ tablet TAKE 1 TABLET(20 MEQ) BY MOUTH DAILY 04/11/22   Biagio Borg, MD  Pyridoxine HCl (VITAMIN B-6 PO) Take 1 tablet by mouth daily.    [provider]  [place] 420 MG/3.5ML SOCT INJECT 1 UNIT INTO THE SKIN EVERY 30  DAYS 06/11/21   Jettie Booze, MD  TART CHERRY PO Take 1 capsule by mouth in the morning and at bedtime. Patient not taking: Reported on 12/26/2021  [provider]  VITAMIN E PO Take 1 tablet by mouth daily. Patient not taking: Reported on 12/26/2021    [provider]  XARELTO 20 MG TABS tablet TAKE 1 TABLET(20 MG) BY MOUTH DAILY WITH SUPPER 03/12/22   Jettie Booze, MD  Zinc 100 MG TABS Take 1 tablet by mouth in the morning. Patient not taking: Reported on 12/26/2021    [provider]    Family History Family History  Problem Relation Age of Onset   Heart disease Father    Heart attack Father    Rectal cancer Father    Asthma Sister    Heart disease Brother    Heart attack Brother    Stomach cancer Neg Hx    Colon cancer Neg Hx     Social History Social History   Tobacco Use   Smoking status: Former    Packs/day: 1.00    Years: 4.00    Total pack years: 4.00    Types: Cigarettes    Quit date: 12/05/1962    Years since quitting: 59.4   Smokeless tobacco: Never  Vaping Use   Vaping Use: Never used  Substance Use Topics   Alcohol use: No   Drug use: No     Allergies   Crestor [rosuvastatin calcium], Hydrocodone-acetaminophen, Lidocaine hcl, Lipitor [atorvastatin calcium], Procaine, Rosuvastatin, Theophylline, Vicodin [hydrocodone-acetaminophen], Codeine, Ephedrine, Other, Oxycodone, Oxycodone-acetaminophen, Propoxyphene, Quinine derivatives, Sulfa antibiotics, Epinephrine, Penicillins, Quinine, and Theophyllines   Review of Systems Review of Systems  Constitutional:  Negative for chills and fever.  Eyes:  Negative for discharge and redness.  Respiratory:  Negative for shortness of breath.   Gastrointestinal:  Negative for abdominal pain.  Skin:  Positive for wound. Negative for color change.  Neurological:  Negative for numbness.     Physical Exam Triage Vital Signs ED Triage Vitals [04/19/22 1407]  Enc Vitals Group      BP 122/81     Pulse Rate 88     Resp 14     Temp 98 F (36.7 C)     Temp Source Oral     SpO2 98 %     Weight      Height      Head Circumference      Peak Flow      Pain Score 0     Pain Loc      Pain Edu?      Excl. in [place]?    No data found.  Updated Vital Signs BP 122/81 (BP Location: Right Arm)   Pulse 88   Temp 98 F (36.7 C) (Oral)   Resp 14   SpO2 98%   Physical Exam Vitals and nursing note reviewed.  Constitutional:      General: She is not in acute distress.    Appearance: Normal appearance. She is not ill-appearing.  HENT:     Head: Normocephalic and atraumatic.  Eyes:     Conjunctiva/sclera: Conjunctivae normal.  Cardiovascular:     Rate and Rhythm: Normal rate.  Pulmonary:     Effort: Pulmonary effort is normal. No respiratory distress.  Skin:    Comments: Approx 3 cm wide superficial skin tear in "U" shape to left lateral lower leg with minimal bleeding, no surrounding erythema, no significant TTP  Neurological:     Mental Status: She is alert.  Psychiatric:        Mood and Affect: Mood normal.        Behavior: Behavior normal.  Thought Content: Thought content normal.      UC Treatments / Results  Labs (all labs ordered are listed, but only abnormal results are displayed) Labs Reviewed - No data to display  EKG   Radiology No results found.  Procedures Procedures (including critical care time)  Medications Ordered in UC Medications  Tdap (BOOSTRIX) injection 0.5 mL (0.5 mLs Intramuscular Given 04/19/22 1416)    Initial Impression / Assessment and Plan / UC Course  I have reviewed the triage vital signs and the nursing notes.  Pertinent labs & imaging results that were available during my care of the patient were reviewed by me and considered in my medical decision making (see chart for details).    Wound irrigated with sterile saline in office and dressed with nonstick dressing and coban. Tdap administered. Recommended she  change dressing daily or sooner if soiled and follow up with any signs of infection or further concerns. Patient expresses understanding.   Final Clinical Impressions(s) / UC Diagnoses   Final diagnoses:  Laceration of right lower leg, initial encounter   Discharge Instructions   None    ED Prescriptions   None    PDMP not reviewed this encounter.   Francene Finders, PA-C 04/19/22 1459

## 2022-04-19 NOTE — ED Triage Notes (Signed)
Pt presents with c/o rt leg laceration after hitting it on the car door this afternoon. Last Tdap >10 years

## 2022-04-24 NOTE — Telephone Encounter (Signed)
Called patient but she did not answer. Her VM picked up but I was unable to leave a message. Will attempt to call back one more time.

## 2022-05-09 ENCOUNTER — Other Ambulatory Visit: Payer: Self-pay | Admitting: Physician Assistant

## 2022-05-09 NOTE — Telephone Encounter (Signed)
Dr Lyndel Safe Please advise on refilling this medication, I do not see this as prescribed from your last office note

## 2022-05-13 ENCOUNTER — Other Ambulatory Visit: Payer: Self-pay | Admitting: Physician Assistant

## 2022-05-14 DIAGNOSIS — L570 Actinic keratosis: Secondary | ICD-10-CM | POA: Diagnosis not present

## 2022-05-14 NOTE — Progress Notes (Deleted)
NEUROLOGY FOLLOW UP OFFICE NOTE  [name] [id number]  Assessment/Plan:   1.  Gait instability with falls - I believe the primary etiology is the degenerative arthritis in her knees, neuropathy in her feet as a secondary cause 2.  Bilateral hand tremors.  Somewhat parkinsonian but I would not diagnose definite Parkinson's disease.  She does not exhibit any rigidity or bradykinesia.  I think her balance problems are not likely related to potential underlying Parkinson's disease.  3.  Degenerative arthritis in the knees 4.  Polyneuropathy   1.  I would like her to follow up in 12 months to that I may re-evaluate and see if she has had any progression of symptoms that would more likely characterize Parkinson's disease.     Subjective:  [name] is an 87 year old right-handed female with atrial fibrillation, CAD, COPD, HTN, hypothyroidism, and arthritis and history of breast cancer who follows up for re-evaluation of Parkinson's disease.  She is accompanied by her daughter.  UPDATE: ***   HISTORY: She reports that she had two mechanical falls in February.  One time, she slid around the doorframe causing her to fall and hit her head.  About a week later, she was having difficulty operating a new but smaller temporary walker (her walker broke).  Her legs got tangled up under it and she fell hitting the back of her head.  She had a probable concussion including headache, dizziness and memory problems.  She had a CT of the head on 08/12/2020, which was personally reviewed and showed mild generalized atrophy and chronic small vessel ischemic changes but no acute abnormality.  She has unsteady gait.  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  She does report that she sometimes has numbness and tingling in her feet at night, but no pain.  She denies back pain or radicular pain down the legs.  She also has multilevel cervical spondylosis, as demonstrated by cervical X-ray on  06/10/2017.  For 15 years she has had a tremor in both hands.  It causes some difficulty writing but otherwise is manageable.   Labs from May 2022 include TSH 3.82, Hgb A1c 6.1, Vit D 32.84.  She takes supplemental B12.  PAST MEDICAL HISTORY: Past Medical History:  Diagnosis Date   Allergy    Anxiety    Arthritis    no cartlidge in knees   Asthma    Bradycardia    a. atenolol stopped due to this, HR 40s.   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  CAD s/p RCA stent 2000 with residual mild-mod disease of left system 2001.   Chronic atrial fibrillation ([facility])    COPD (chronic obstructive pulmonary disease) ([place])    Essential hypertension 03/24/2015   Gout    post operative   Hx of radiation therapy 04/02/11 -05/20/11   left breast   Hyperlipidemia    Hypothyroidism 03/24/2015   Incontinence of urine    Malignant neoplasm of upper-outer quadrant of female breast ([place]) 12/26/2010   Osteoporosis 03/24/2015   Peripheral neuropathy 03/24/2015   Plantar fasciitis    Sleep apnea     MEDICATIONS: Current Outpatient Medications on File Prior to Visit  Medication Sig Dispense Refill   albuterol (PROVENTIL) (2.5 MG/3ML) 0.083% nebulizer solution Take 3 mLs (2.5 mg total) by nebulization every 6 (six) hours as needed for wheezing or shortness of breath.  75 mL 12   allopurinol (ZYLOPRIM) 300 MG tablet Take 300 mg by mouth daily.     ALPRAZolam (XANAX) 0.5 MG tablet TAKE 1 TO 2 TABLETS BY MOUTH TWICE DAILY AS NEEDED FOR ANXIETY OR SLEEP 60 tablet 2   AMBULATORY NON FORMULARY MEDICATION Medication Name: Prednisone Injection in knee     Ascorbic Acid (VITAMIN C) 1000 MG tablet Take 1,000 mg by mouth in the morning. (Patient not taking: Reported on 02/12/2022)     atorvastatin (LIPITOR) 20 MG tablet Take 1 tablet (20 mg total) by mouth daily. (Patient not taking: Reported on 03/21/2022) 90 tablet 3   azelastine (ASTELIN) 0.1 % nasal spray Place 2 sprays  into both nostrils daily. Use in each nostril as directed (Patient taking differently: Place 2 sprays into both nostrils in the morning.) 30 mL 5   B Complex Vitamins (VITAMIN B COMPLEX PO) Take 1 tablet by mouth daily. (Patient not taking: Reported on 12/26/2021)     Biotin 5000 MCG CAPS Take 5,000 mcg by mouth daily.  (Patient not taking: Reported on 12/26/2021)     cholecalciferol (VITAMIN D) 1000 UNITS tablet Take 1,000 Units by mouth daily. (Patient not taking: Reported on 12/26/2021)     Choline Dihydrogen Citrate (CHOLINE CITRATE PO) Take 1 capsule by mouth in the morning. (Patient not taking: Reported on 02/12/2022)     co-enzyme Q-10 50 MG capsule Take 100 mg by mouth daily.     colchicine 0.6 MG tablet Take 0.6 mg by mouth daily as needed (flare  up).     Cyanocobalamin (VITAMIN B-12 PO) Take 1 tablet by mouth daily. (Patient not taking: Reported on 03/21/2022)     cycloSPORINE (RESTASIS) 0.05 % ophthalmic emulsion Apply 1 drop to eye 2 (two) times daily as needed (allergies).     dextromethorphan-guaiFENesin (MUCINEX DM) 30-600 MG 12hr tablet Take 1 tablet by mouth 2 (two) times daily.     EPINEPHrine 0.3 mg/0.3 mL IJ SOAJ injection Inject 0.3 mg into the muscle as needed for anaphylaxis.     escitalopram (LEXAPRO) 10 MG tablet TAKE 1 TABLET(10 MG) BY MOUTH DAILY 90 tablet 3   ezetimibe (ZETIA) 10 MG tablet Take 10 mg by mouth 2 (two) times a week. Take on Tues & Fri     fexofenadine (ALLERGY RELIEF) 180 MG tablet Take 1 tablet (180 mg total) by mouth daily. 90 tablet 5   fluticasone (FLONASE) 50 MCG/ACT nasal spray Place 1 spray into both nostrils every morning.     fluticasone-salmeterol (ADVAIR) 500-50 MCG/ACT AEPB Inhale 1 puff into the lungs in the morning and at bedtime.     gabapentin (NEURONTIN) 300 MG capsule Take 1 capsule (300 mg total) by mouth 3 (three) times daily. 270 capsule 1   Homeopathic Products (ARNICARE) GEL Apply 1 application  topically daily as needed (soreness).      hydrochlorothiazide (HYDRODIURIL) 25 MG tablet TAKE 2 TABLETS(50 MG) BY MOUTH DAILY 180 tablet 1   levalbuterol (XOPENEX HFA) 45 MCG/ACT inhaler Inhale 2 puffs into the lungs every 6 (six) hours as needed for wheezing or shortness of breath. 3 Inhaler 3   levocetirizine (XYZAL) 5 MG tablet Take 1 tablet (5 mg total) by mouth every evening. 30 tablet 11   levothyroxine (SYNTHROID) 75 MCG tablet Take 1 tablet (75 mcg total) by mouth daily before breakfast. 90 tablet 1   metoprolol succinate (TOPROL-XL) 25 MG 24 hr tablet TAKE 1/2 TABLET(12.5 MG) BY MOUTH DAILY 60 tablet 0  mirtazapine (REMERON) 15 MG tablet Take 1 tablet (15 mg total) by mouth at bedtime. 30 tablet 2   montelukast (SINGULAIR) 10 MG tablet Take 1 tablet (10 mg total) by mouth at bedtime. 90 tablet 1   Omega-3 Fatty Acids (FISH OIL) 1000 MG CAPS Take 1 capsule by mouth daily.     ondansetron (ZOFRAN) 4 MG tablet Take 1 tablet (4 mg total) by mouth every 8 (eight) hours as needed for nausea or vomiting. 40 tablet 3   pantoprazole (PROTONIX) 40 MG tablet Take 1 tablet (40 mg total) by mouth daily. 30 tablet 4   pantoprazole (PROTONIX) 40 MG tablet Take 1 tablet (40 mg total) by mouth daily. 90 tablet 4   potassium chloride SA (KLOR-CON M) 20 MEQ tablet TAKE 1 TABLET(20 MEQ) BY MOUTH DAILY 90 tablet 3   Pyridoxine HCl (VITAMIN B-6 PO) Take 1 tablet by mouth daily.     [place] 420 MG/3.5ML SOCT INJECT 1 UNIT INTO THE SKIN EVERY 30 DAYS 3.5 mL 11   TART CHERRY PO Take 1 capsule by mouth in the morning and at bedtime. (Patient not taking: Reported on 12/26/2021)     VITAMIN E PO Take 1 tablet by mouth daily. (Patient not taking: Reported on 12/26/2021)     XARELTO 20 MG TABS tablet TAKE 1 TABLET(20 MG) BY MOUTH DAILY WITH SUPPER 90 tablet 1   Zinc 100 MG TABS Take 1 tablet by mouth in the morning. (Patient not taking: Reported on 12/26/2021)     No current facility-administered medications on file prior to visit.     ALLERGIES: Allergies  Allergen Reactions   Crestor [Rosuvastatin Calcium]     Severe liver function problems   Hydrocodone-Acetaminophen Anxiety and Itching   Lidocaine Hcl Other (See Comments)    Novacaine:  Becomes very shaky   Lipitor [Atorvastatin Calcium] Other (See Comments)    Elevated liver function    Procaine Other (See Comments)    shaking   Rosuvastatin Other (See Comments)    Severe liver function problems   Theophylline Other (See Comments)    Becomes very shaky skaking skaking   Vicodin [Hydrocodone-Acetaminophen] Itching    Itching all over   Codeine Nausea Only, Anxiety and Other (See Comments)    Also complained of dizziness.  Has same problems with oxycodone and hydrocodone Visual Disturbance, Balance Difficulty, Dizzy "Room Spinning; "Swimming" Balance, vision, nausea, dizzy, "swimming", "room spinning" Balance, vision, nausea, dizzy, "swimming", "room spinning"   Ephedrine Other (See Comments)    shaking   Other Other (See Comments)    Quinine causes nausea, dizzy   Oxycodone Other (See Comments)    Balance, vision, nausea, dizzy, "swimming", "room spinning"   Oxycodone-Acetaminophen Nausea Only, Anxiety and Other (See Comments)    Visual Disturbance, Balance Difficulty, Dizzy "Room Spinning; "Swimming"   Propoxyphene Anxiety, Nausea Only and Other (See Comments)    Visual Disturbance, Balance Difficulty, Dizzy "Room Spinning; "Swimming" Balance, vision, nausea, dizzy, "swimming", "room spinning" Balance, vision, nausea, dizzy, "swimming, "room spinning"   Quinine Derivatives Nausea Only    dizzy   Sulfa Antibiotics Nausea Only    Also complained of dizziness   Epinephrine Other (See Comments)    Patient becomes "shaky" shaking shaking   Penicillins Rash    Pt reported all over body rash in 1958 Has patient had a PCN reaction causing immediate rash, facial/tongue/throat swelling, SOB or lightheadedness with hypotension:Yes Has patient had a  PCN reaction causing severe rash involving mucus  membranes or skin necrosis: No Has patient had a PCN reaction that required hospitalization: No Has patient had a PCN reaction occurring within the last 10 years:No     Quinine Other (See Comments)   Theophyllines Other (See Comments)    Patient becomes "shaky"    FAMILY HISTORY: Family History  Problem Relation Age of Onset   Heart disease Father    Heart attack Father    Rectal cancer Father    Asthma Sister    Heart disease Brother    Heart attack Brother    Stomach cancer Neg Hx    Colon cancer Neg Hx       Objective:  *** General: No acute distress.  Patient appears well-groomed.   Head:  Normocephalic/atraumatic Eyes:  Fundi examined but not visualized Neck: supple, no paraspinal tenderness, full range of motion Heart:  Regular rate and rhythm Lungs:  Clear to auscultation bilaterally Back: No paraspinal tenderness Neurological Exam: alert and oriented to person, place, and time.  Speech fluent and not dysarthric, language intact.  CN II-XII intact. Bulk and tone normal, no rigidity; muscle strength 5/5 throughout.  No bradykinesia.  Intermittent resting, postural and intention tremor in hands bilaterally.  Sensation to light touch intact.  Deep tendon reflexes 2+ throughout.  Finger to nose testing intact.  Broad-based antalgic gait.  ***   Metta Clines, DO  CC: Cathlean Cower, MD

## 2022-05-15 ENCOUNTER — Ambulatory Visit: Payer: Medicare Other | Admitting: Neurology

## 2022-05-15 ENCOUNTER — Encounter: Payer: Self-pay | Admitting: Neurology

## 2022-05-16 ENCOUNTER — Other Ambulatory Visit (HOSPITAL_COMMUNITY): Payer: Self-pay

## 2022-05-20 DIAGNOSIS — J9601 Acute respiratory failure with hypoxia: Secondary | ICD-10-CM | POA: Diagnosis not present

## 2022-05-20 DIAGNOSIS — J441 Chronic obstructive pulmonary disease with (acute) exacerbation: Secondary | ICD-10-CM | POA: Diagnosis not present

## 2022-05-21 ENCOUNTER — Encounter: Payer: Self-pay | Admitting: Internal Medicine

## 2022-05-21 ENCOUNTER — Ambulatory Visit (INDEPENDENT_AMBULATORY_CARE_PROVIDER_SITE_OTHER): Payer: Medicare Other | Admitting: Internal Medicine

## 2022-05-21 VITALS — BP 124/76 | HR 65 | Temp 97.5°F | Ht 66.0 in | Wt 237.0 lb

## 2022-05-21 DIAGNOSIS — E538 Deficiency of other specified B group vitamins: Secondary | ICD-10-CM | POA: Diagnosis not present

## 2022-05-21 DIAGNOSIS — E039 Hypothyroidism, unspecified: Secondary | ICD-10-CM | POA: Diagnosis not present

## 2022-05-21 DIAGNOSIS — Z0001 Encounter for general adult medical examination with abnormal findings: Secondary | ICD-10-CM

## 2022-05-21 DIAGNOSIS — E782 Mixed hyperlipidemia: Secondary | ICD-10-CM

## 2022-05-21 DIAGNOSIS — R7303 Prediabetes: Secondary | ICD-10-CM | POA: Diagnosis not present

## 2022-05-21 DIAGNOSIS — I10 Essential (primary) hypertension: Secondary | ICD-10-CM

## 2022-05-21 DIAGNOSIS — E559 Vitamin D deficiency, unspecified: Secondary | ICD-10-CM | POA: Diagnosis not present

## 2022-05-21 LAB — HEPATIC FUNCTION PANEL
ALT: 12 U/L (ref 0–35)
AST: 23 U/L (ref 0–37)
Albumin: 4 g/dL (ref 3.5–5.2)
Alkaline Phosphatase: 79 U/L (ref 39–117)
Bilirubin, Direct: 0.2 mg/dL (ref 0.0–0.3)
Total Bilirubin: 0.9 mg/dL (ref 0.2–1.2)
Total Protein: 6.6 g/dL (ref 6.0–8.3)

## 2022-05-21 LAB — CBC WITH DIFFERENTIAL/PLATELET
Basophils Absolute: 0 10*3/uL (ref 0.0–0.1)
Basophils Relative: 0.4 % (ref 0.0–3.0)
Eosinophils Absolute: 0.3 10*3/uL (ref 0.0–0.7)
Eosinophils Relative: 4.4 % (ref 0.0–5.0)
HCT: 39.3 % (ref 36.0–46.0)
Hemoglobin: 13.2 g/dL (ref 12.0–15.0)
Lymphocytes Relative: 21.7 % (ref 12.0–46.0)
Lymphs Abs: 1.4 10*3/uL (ref 0.7–4.0)
MCHC: 33.6 g/dL (ref 30.0–36.0)
MCV: 100.6 fl — ABNORMAL HIGH (ref 78.0–100.0)
Monocytes Absolute: 0.7 10*3/uL (ref 0.1–1.0)
Monocytes Relative: 10.7 % (ref 3.0–12.0)
Neutro Abs: 4.2 10*3/uL (ref 1.4–7.7)
Neutrophils Relative %: 62.8 % (ref 43.0–77.0)
Platelets: 149 10*3/uL — ABNORMAL LOW (ref 150.0–400.0)
RBC: 3.91 Mil/uL (ref 3.87–5.11)
RDW: 14.6 % (ref 11.5–15.5)
WBC: 6.6 10*3/uL (ref 4.0–10.5)

## 2022-05-21 LAB — BASIC METABOLIC PANEL
BUN: 24 mg/dL — ABNORMAL HIGH (ref 6–23)
CO2: 31 mEq/L (ref 19–32)
Calcium: 9.7 mg/dL (ref 8.4–10.5)
Chloride: 102 mEq/L (ref 96–112)
Creatinine, Ser: 1.13 mg/dL (ref 0.40–1.20)
GFR: 44.6 mL/min — ABNORMAL LOW (ref >60.00)
Glucose, Bld: 96 mg/dL (ref 70–99)
Potassium: 3.6 mEq/L (ref 3.5–5.1)
Sodium: 140 mEq/L (ref 135–145)

## 2022-05-21 LAB — URINALYSIS, ROUTINE W REFLEX MICROSCOPIC
Bilirubin Urine: NEGATIVE
Ketones, ur: NEGATIVE
Nitrite: POSITIVE — AB
Specific Gravity, Urine: 1.015 (ref 1.000–1.030)
Total Protein, Urine: NEGATIVE
Urine Glucose: NEGATIVE
Urobilinogen, UA: 0.2 (ref 0.0–1.0)
pH: 7 (ref 5.0–8.0)

## 2022-05-21 LAB — LIPID PANEL
Cholesterol: 199 mg/dL (ref 0–200)
HDL: 60.7 mg/dL (ref >39.00)
LDL Cholesterol: 116 mg/dL — ABNORMAL HIGH (ref 0–99)
NonHDL: 138.59
Total CHOL/HDL Ratio: 3
Triglycerides: 111 mg/dL (ref 0.0–149.0)
VLDL: 22.2 mg/dL (ref 0.0–40.0)

## 2022-05-21 LAB — VITAMIN D 25 HYDROXY (VIT D DEFICIENCY, FRACTURES): VITD: 35.51 ng/mL (ref 30.00–100.00)

## 2022-05-21 LAB — MICROALBUMIN / CREATININE URINE RATIO
Creatinine,U: 77.1 mg/dL
Microalb Creat Ratio: 2.2 mg/g (ref 0.0–30.0)
Microalb, Ur: 1.7 mg/dL (ref 0.0–1.9)

## 2022-05-21 LAB — HEMOGLOBIN A1C: Hgb A1c MFr Bld: 5.4 % (ref 4.6–6.5)

## 2022-05-21 LAB — TSH: TSH: 1.76 u[IU]/mL (ref 0.35–5.50)

## 2022-05-21 LAB — VITAMIN B12: Vitamin B-12: 268 pg/mL (ref 211–911)

## 2022-05-21 NOTE — Assessment & Plan Note (Signed)

## 2022-05-21 NOTE — Assessment & Plan Note (Signed)
Lab Results  Component Value Date   LDLCALC 45 09/28/2021   Stable, pt to continue Repatha Injections

## 2022-05-21 NOTE — Assessment & Plan Note (Signed)
Lab Results  Component Value Date   TSH 1.24 09/28/2021   Stable, pt to continue levothyroxine 75 mcg qd

## 2022-05-21 NOTE — Assessment & Plan Note (Signed)
BP Readings from Last 3 Encounters:  05/21/22 124/76  04/19/22 122/81  03/21/22 118/76   Stable, pt to continue medical treatment hct 25 qd, toprol xl 25 - 1/2 qd

## 2022-05-21 NOTE — Assessment & Plan Note (Signed)
Lab Results  Component Value Date   HGBA1C 5.8 09/28/2021   Stable, pt to continue current medical treatment  - diet, wt control, excercise

## 2022-05-21 NOTE — Patient Instructions (Addendum)
Please have your Shingrix (shingles) shots done at your local pharmacy.  Please continue all other medications as before, and refills have been done if requested.  Please have the pharmacy call with any other refills you may need.  Please continue your efforts at being more active, low cholesterol diet, and weight control.  You are otherwise up to date with prevention measures today.  Please keep your appointments with your specialists as you may have planned  Please go to the LAB at the blood drawing area for the tests to be done  You will be contacted by phone if any changes need to be made immediately.  Otherwise, you will receive a letter about your results with an explanation, but please check with MyChart first.  Please remember to sign up for MyChart if you have not done so, as this will be important to you in the future with finding out test results, communicating by private email, and scheduling acute appointments online when needed.  Please make an Appointment to return in 6 months, or sooner if needed 

## 2022-05-21 NOTE — Progress Notes (Signed)
Patient ID: [name], female   DOB: Sep 20, 1935, 87 y.o.   MRN: [id number]         Chief Complaint:: wellness exam and htn, hld, low thryoid, prediabetes       HPI:  [name] is a 87 y.o. female here for wellness exam;  declines covid booster, o/w up to date                        Also Gained several lbs recently on remeron 15 qhs but o/w feeling improved mood and sleeps better.  Pt denies chest pain, increased sob or doe, wheezing, orthopnea, PND, increased LE swelling, palpitations, dizziness or syncope.   Pt denies polydipsia, polyuria, or new focal neuro s/s.    Pt denies fever, wt loss, night sweats, loss of appetite, or other constitutional symptoms    Wt Readings from Last 3 Encounters:  05/21/22 237 lb (107.5 kg)  03/21/22 232 lb 3.2 oz (105.3 kg)  02/12/22 222 lb (100.7 kg)   BP Readings from Last 3 Encounters:  05/21/22 124/76  04/19/22 122/81  03/21/22 118/76   Immunization History  Administered Date(s) Administered   Fluad Quad(high Dose 65+) 03/30/2019, 01/21/2020, 04/24/2021, 02/12/2022   Influenza Split 04/06/2009, 04/05/2010, 01/10/2011, 02/10/2012, 04/23/2013   Influenza, High Dose Seasonal PF 03/20/2017, 01/19/2018   Influenza-Unspecified 05/08/2015, 01/23/2016   PFIZER(Purple Top)SARS-COV-2 Vaccination 06/11/2019, 07/06/2019, 12/29/2019, 02/28/2020, 12/18/2020   Pneumococcal Conjugate-13 06/01/2013, 03/20/2017   Pneumococcal Polysaccharide-23 03/16/2003, 01/23/2016, 12/18/2020   Td 12/08/2009   Tdap 04/19/2022   Zoster Recombinat (Shingrix) 06/07/2015, 03/18/2016   Zoster, Live 06/07/2015  There are no preventive care reminders to display for this patient.    Past Medical History:  Diagnosis Date   Allergy    Anxiety    Arthritis    no cartlidge in knees   Asthma    Bradycardia    a. atenolol stopped due to this, HR 40s.   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  CAD s/p RCA stent 2000 with residual  mild-mod disease of left system 2001.   Chronic atrial fibrillation (HCC)    COPD (chronic obstructive pulmonary disease) ([place])    Essential hypertension 03/24/2015   Gout    post operative   Hx of radiation therapy 04/02/11 -05/20/11   left breast   Hyperlipidemia    Hypothyroidism 03/24/2015   Incontinence of urine    Malignant neoplasm of upper-outer quadrant of female breast ([place]) 12/26/2010   Osteoporosis 03/24/2015   Peripheral neuropathy 03/24/2015   Plantar fasciitis    Sleep apnea    Past Surgical History:  Procedure Laterality Date   BREAST LUMPECTOMY W/ NEEDLE LOCALIZATION  01/29/2011   Left with [facility] Dr Margot Chimes   CHOLECYSTECTOMY  1955   CORONARY ANGIOPLASTY WITH STENT PLACEMENT  2000   DILATION AND CURETTAGE OF UTERUS  1976   and 1996   [place]   tendons between thumb and forefinger   SKIN BIOPSY     1994, 2006, 2008, 2010, 2011, 2011, 2012-  pre-cancerous   TONSILLECTOMY  1955    reports that she quit smoking about 59 years ago. Her smoking use included cigarettes. She has a 4.00 pack-year smoking history. She has never used smokeless tobacco. She reports that she does not drink alcohol and does not use drugs. family history includes Asthma in her sister; Heart attack in  her brother and father; Heart disease in her brother and father; Rectal cancer in her father. Allergies  Allergen Reactions   Crestor [Rosuvastatin Calcium]     Severe liver function problems   Hydrocodone-Acetaminophen Anxiety and Itching   Lidocaine Hcl Other (See Comments)    Novacaine:  Becomes very shaky   Lipitor [Atorvastatin Calcium] Other (See Comments)    Elevated liver function    Procaine Other (See Comments)    shaking   Rosuvastatin Other (See Comments)    Severe liver function problems   Theophylline Other (See Comments)    Becomes very shaky skaking skaking   Vicodin [Hydrocodone-Acetaminophen] Itching    Itching all over   Codeine Nausea Only, Anxiety and Other  (See Comments)    Also complained of dizziness.  Has same problems with oxycodone and hydrocodone Visual Disturbance, Balance Difficulty, Dizzy "Room Spinning; "Swimming" Balance, vision, nausea, dizzy, "swimming", "room spinning" Balance, vision, nausea, dizzy, "swimming", "room spinning"   Ephedrine Other (See Comments)    shaking   Other Other (See Comments)    Quinine causes nausea, dizzy   Oxycodone Other (See Comments)    Balance, vision, nausea, dizzy, "swimming", "room spinning"   Oxycodone-Acetaminophen Nausea Only, Anxiety and Other (See Comments)    Visual Disturbance, Balance Difficulty, Dizzy "Room Spinning; "Swimming"   Propoxyphene Anxiety, Nausea Only and Other (See Comments)    Visual Disturbance, Balance Difficulty, Dizzy "Room Spinning; "Swimming" Balance, vision, nausea, dizzy, "swimming", "room spinning" Balance, vision, nausea, dizzy, "swimming, "room spinning"   Quinine Derivatives Nausea Only    dizzy   Sulfa Antibiotics Nausea Only    Also complained of dizziness   Epinephrine Other (See Comments)    Patient becomes "shaky" shaking shaking   Penicillins Rash    Pt reported all over body rash in 1958 Has patient had a PCN reaction causing immediate rash, facial/tongue/throat swelling, SOB or lightheadedness with hypotension:Yes Has patient had a PCN reaction causing severe rash involving mucus membranes or skin necrosis: No Has patient had a PCN reaction that required hospitalization: No Has patient had a PCN reaction occurring within the last 10 years:No     Quinine Other (See Comments)   Theophyllines Other (See Comments)    Patient becomes "shaky"   Current Outpatient Medications on File Prior to Visit  Medication Sig Dispense Refill   albuterol (PROVENTIL) (2.5 MG/3ML) 0.083% nebulizer solution Take 3 mLs (2.5 mg total) by nebulization every 6 (six) hours as needed for wheezing or shortness of breath. 75 mL 12   allopurinol (ZYLOPRIM) 300 MG tablet  Take 300 mg by mouth daily.     ALPRAZolam (XANAX) 0.5 MG tablet TAKE 1 TO 2 TABLETS BY MOUTH TWICE DAILY AS NEEDED FOR ANXIETY OR SLEEP 60 tablet 2   AMBULATORY NON FORMULARY MEDICATION Medication Name: Prednisone Injection in knee     azelastine (ASTELIN) 0.1 % nasal spray Place 2 sprays into both nostrils daily. Use in each nostril as directed (Patient taking differently: Place 2 sprays into both nostrils in the morning.) 30 mL 5   co-enzyme Q-10 50 MG capsule Take 100 mg by mouth daily.     colchicine 0.6 MG tablet Take 0.6 mg by mouth daily as needed (flare  up).     cycloSPORINE (RESTASIS) 0.05 % ophthalmic emulsion Apply 1 drop to eye 2 (two) times daily as needed (allergies).     EPINEPHrine 0.3 mg/0.3 mL IJ SOAJ injection Inject 0.3 mg into the muscle as needed for anaphylaxis.  escitalopram (LEXAPRO) 10 MG tablet TAKE 1 TABLET(10 MG) BY MOUTH DAILY 90 tablet 3   ezetimibe (ZETIA) 10 MG tablet Take 10 mg by mouth 2 (two) times a week. Take on Tues & Fri     fexofenadine (ALLERGY RELIEF) 180 MG tablet Take 1 tablet (180 mg total) by mouth daily. 90 tablet 5   fluticasone (FLONASE) 50 MCG/ACT nasal spray Place 1 spray into both nostrils every morning.     fluticasone-salmeterol (ADVAIR) 500-50 MCG/ACT AEPB Inhale 1 puff into the lungs in the morning and at bedtime.     Homeopathic Products (ARNICARE) GEL Apply 1 application  topically daily as needed (soreness).     hydrochlorothiazide (HYDRODIURIL) 25 MG tablet TAKE 2 TABLETS(50 MG) BY MOUTH DAILY 180 tablet 1   levalbuterol (XOPENEX HFA) 45 MCG/ACT inhaler Inhale 2 puffs into the lungs every 6 (six) hours as needed for wheezing or shortness of breath. 3 Inhaler 3   levocetirizine (XYZAL) 5 MG tablet Take 1 tablet (5 mg total) by mouth every evening. 30 tablet 11   levothyroxine (SYNTHROID) 75 MCG tablet Take 1 tablet (75 mcg total) by mouth daily before breakfast. 90 tablet 1   metoprolol succinate (TOPROL-XL) 25 MG 24 hr tablet TAKE  1/2 TABLET(12.5 MG) BY MOUTH DAILY 60 tablet 0   mirtazapine (REMERON) 15 MG tablet TAKE 1 TABLET(15 MG) BY MOUTH AT BEDTIME 30 tablet 2   montelukast (SINGULAIR) 10 MG tablet Take 1 tablet (10 mg total) by mouth at bedtime. 90 tablet 1   ondansetron (ZOFRAN) 4 MG tablet Take 1 tablet (4 mg total) by mouth every 8 (eight) hours as needed for nausea or vomiting. 40 tablet 3   pantoprazole (PROTONIX) 40 MG tablet Take 1 tablet (40 mg total) by mouth daily. 30 tablet 4   pantoprazole (PROTONIX) 40 MG tablet Take 1 tablet (40 mg total) by mouth daily. 90 tablet 4   potassium chloride SA (KLOR-CON M) 20 MEQ tablet TAKE 1 TABLET(20 MEQ) BY MOUTH DAILY 90 tablet 3   REPATHA PUSHTRONEX SYSTEM 420 MG/3.5ML SOCT INJECT 1 UNIT INTO THE SKIN EVERY 30 DAYS 3.5 mL 11   XARELTO 20 MG TABS tablet TAKE 1 TABLET(20 MG) BY MOUTH DAILY WITH SUPPER 90 tablet 1   No current facility-administered medications on file prior to visit.        ROS:  All others reviewed and negative.  Objective        PE:  BP 124/76 (BP Location: Left Arm, Patient Position: Sitting, Cuff Size: Large)   Pulse 65   Temp (!) 97.5 F (36.4 C) (Oral)   Ht '5\' 6"'$  (1.676 m)   Wt 237 lb (107.5 kg)   SpO2 96%   BMI 38.25 kg/m                 Constitutional: Pt appears in NAD               HENT: Head: NCAT.                Right Ear: External ear normal.                 Left Ear: External ear normal.                Eyes: . Pupils are equal, round, and reactive to light. Conjunctivae and EOM are normal               Nose: without d/c or deformity  Neck: Neck supple. Gross normal ROM               Cardiovascular: Normal rate and regular rhythm.                 Pulmonary/Chest: Effort normal and breath sounds without rales or wheezing.                Abd:  Soft, NT, ND, + BS, no organomegaly               Neurological: Pt is alert. At baseline orientation, motor grossly intact               Skin: Skin is warm. No rashes, no  other new lesions, LE edema - trace bilateral               Psychiatric: Pt behavior is normal without agitation   Micro: none  Cardiac tracings I have personally interpreted today:  none  Pertinent Radiological findings (summarize): none   Lab Results  Component Value Date   WBC 5.1 03/21/2022   HGB 12.9 03/21/2022   HCT 37.9 03/21/2022   PLT 138.0 (L) 03/21/2022   GLUCOSE 97 03/21/2022   CHOL 102 09/28/2021   TRIG 92.0 09/28/2021   HDL 38.90 (L) 09/28/2021   LDLDIRECT 107.0 09/17/2016   LDLCALC 45 09/28/2021   ALT 12 03/21/2022   AST 22 03/21/2022   NA 142 03/21/2022   K 3.2 (L) 03/21/2022   CL 105 03/21/2022   CREATININE 1.14 03/21/2022   BUN 22 03/21/2022   CO2 31 03/21/2022   TSH 1.24 09/28/2021   INR 2.1 (H) 03/21/2022   HGBA1C 5.8 09/28/2021   Assessment/Plan:  MICHAELLA IMAI is a 87 y.o. 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  Encounter for well adult exam with abnormal findings Age and sex appropriate education and counseling updated with regular exercise and diet Referrals for preventative services - none needed Immunizations addressed - declines covid booster Smoking counseling  - none needed Evidence for depression or other mood disorder - none significant Most recent labs reviewed. I have personally reviewed and have noted: 1) the patient's medical and social history 2) The patient's current medications and  supplements 3) The patient's height, weight, and BMI have been recorded in the chart   Essential hypertension BP Readings from Last 3 Encounters:  05/21/22 124/76  04/19/22 122/81  03/21/22 118/76   Stable, pt to continue medical treatment hct 25 qd, toprol xl 25 - 1/2 qd   Hyperlipidemia Lab Results  Component Value Date   LDLCALC 45 09/28/2021   Stable, pt to continue Repatha Injections   Hypothyroidism Lab Results  Component Value Date   TSH 1.24 09/28/2021   Stable, pt to continue levothyroxine 75 mcg qd   Prediabetes Lab Results  Component Value Date   HGBA1C 5.8 09/28/2021   Stable, pt to continue current medical treatment  - diet, wt control, excercise  Followup: Return in about 6 months (around 11/19/2022).  Cathlean Cower, MD 05/21/2022 1:19 PM [facility]

## 2022-05-30 NOTE — Progress Notes (Signed)
NEUROLOGY FOLLOW UP OFFICE NOTE  PEYTYN TRINE [id number]  Assessment/Plan:   Unsteady gait - likely multifactorial due to arthritis in knees and underlying neuropathy Tremors in hands - no longer prominent.  Perhaps mild essential tremor but not significant.  She does not exhibit symptoms of Parkinson's disease   1.  Follow up as needed.     Subjective:  DIANI JILLSON is an 87 year old right-handed female with atrial fibrillation, CAD, COPD, HTN, hypothyroidism, and arthritis and history of breast cancer who follows up for re-evaluation of Parkinson's disease.  She is accompanied by her daughter.  UPDATE: She is feeling much better.  She believes bereavement was contributing to her physical decline last year.     HISTORY: She reports that she had two mechanical falls in February.  One time, she slid around the doorframe causing her to fall and hit her head.  About a week later, she was having difficulty operating a new but smaller temporary walker (her walker broke).  Her legs got tangled up under it and she fell hitting the back of her head.  She had a probable concussion including headache, dizziness and memory problems.  She had a CT of the head on 08/12/2020, which was personally reviewed and showed mild generalized atrophy and chronic small vessel ischemic changes but no acute abnormality.  She has unsteady gait.  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  She does report that she sometimes has numbness and tingling in her feet at night, but no pain.  She denies back pain or radicular pain down the legs.  She also has multilevel cervical spondylosis, as demonstrated by cervical X-ray on 06/10/2017.  For 15 years she has had a tremor in both hands.  It causes some difficulty writing but otherwise is manageable.   Labs from May 2022 include TSH 3.82, Hgb A1c 6.1, Vit D 32.84.  She takes supplemental B12.  PAST MEDICAL HISTORY: Past Medical History:  Diagnosis Date    Allergy    Anxiety    Arthritis    no cartlidge in knees   Asthma    Bradycardia    a. atenolol stopped due to this, HR 40s.   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  CAD s/p RCA stent 2000 with residual mild-mod disease of left system 2001.   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 75 mL 12   allopurinol (ZYLOPRIM) 300 MG tablet Take 300 mg by mouth daily.     ALPRAZolam (XANAX) 0.5 MG tablet TAKE 1 TO 2 TABLETS BY MOUTH TWICE DAILY AS NEEDED FOR ANXIETY OR SLEEP 60 tablet 2   AMBULATORY NON FORMULARY MEDICATION Medication Name:  Prednisone Injection in knee     Ascorbic Acid (VITAMIN C) 1000 MG tablet Take 1,000 mg by mouth in the morning. (Patient not taking: Reported on 02/12/2022)     atorvastatin (LIPITOR) 20 MG tablet Take 1 tablet (20 mg total) by mouth daily. (Patient not taking: Reported on 03/21/2022) 90 tablet 3   azelastine (ASTELIN) 0.1 % nasal spray Place 2 sprays into both nostrils daily. Use in each nostril as directed (Patient taking differently: Place 2 sprays into both nostrils in the morning.) 30 mL 5   B Complex Vitamins (VITAMIN B COMPLEX PO) Take 1 tablet by mouth daily. (Patient not taking: Reported on 12/26/2021)     Biotin 5000 MCG  CAPS Take 5,000 mcg by mouth daily.  (Patient not taking: Reported on 12/26/2021)     cholecalciferol (VITAMIN D) 1000 UNITS tablet Take 1,000 Units by mouth daily. (Patient not taking: Reported on 12/26/2021)     Choline Dihydrogen Citrate (CHOLINE CITRATE PO) Take 1 capsule by mouth in the morning. (Patient not taking: Reported on 02/12/2022)     co-enzyme Q-10 50 MG capsule Take 100 mg by mouth daily.     colchicine 0.6 MG tablet Take 0.6 mg by mouth daily as needed (flare  up).     Cyanocobalamin (VITAMIN B-12 PO) Take 1 tablet by mouth daily. (Patient not taking: Reported on 03/21/2022)     cycloSPORINE (RESTASIS) 0.05 % ophthalmic emulsion Apply 1 drop to eye 2 (two) times daily as needed (allergies).     dextromethorphan-guaiFENesin (MUCINEX DM) 30-600 MG 12hr tablet Take 1 tablet by mouth 2 (two) times daily.     EPINEPHrine 0.3 mg/0.3 mL IJ SOAJ injection Inject 0.3 mg into the muscle as needed for anaphylaxis.     escitalopram (LEXAPRO) 10 MG tablet TAKE 1 TABLET(10 MG) BY MOUTH DAILY 90 tablet 3   ezetimibe (ZETIA) 10 MG tablet Take 10 mg by mouth 2 (two) times a week. Take on Tues & Fri     fexofenadine (ALLERGY RELIEF) 180 MG tablet Take 1 tablet (180 mg total) by mouth daily. 90 tablet 5   fluticasone (FLONASE) 50 MCG/ACT nasal spray Place 1 spray into both nostrils every morning.     fluticasone-salmeterol (ADVAIR) 500-50 MCG/ACT AEPB Inhale 1 puff into the lungs in the morning and at bedtime.     gabapentin (NEURONTIN) 300 MG capsule Take 1 capsule (300 mg total) by mouth 3 (three) times daily. 270 capsule 1   Homeopathic Products (ARNICARE) GEL Apply 1 application  topically daily as needed (soreness).     hydrochlorothiazide (HYDRODIURIL) 25 MG tablet TAKE 2 TABLETS(50 MG) BY MOUTH DAILY 180 tablet 1   levalbuterol (XOPENEX HFA) 45 MCG/ACT inhaler Inhale 2 puffs into the lungs every 6 (six) hours as needed for wheezing or shortness of breath. 3 Inhaler 3   levocetirizine (XYZAL) 5  MG tablet Take 1 tablet (5 mg total) by mouth every evening. 30 tablet 11   levothyroxine (SYNTHROID) 75 MCG tablet Take 1 tablet (75 mcg total) by mouth daily before breakfast. 90 tablet 1   metoprolol succinate (TOPROL-XL) 25 MG 24 hr tablet TAKE 1/2 TABLET(12.5 MG) BY MOUTH DAILY 60 tablet 0   mirtazapine (REMERON) 15 MG tablet Take 1 tablet (15 mg total) by mouth at bedtime. 30 tablet 2   montelukast (SINGULAIR) 10 MG tablet Take 1 tablet (10 mg total) by mouth at bedtime. 90 tablet 1   Omega-3 Fatty Acids (FISH OIL) 1000 MG CAPS Take 1 capsule  by mouth daily.     ondansetron (ZOFRAN) 4 MG tablet Take 1 tablet (4 mg total) by mouth every 8 (eight) hours as needed for nausea or vomiting. 40 tablet 3   pantoprazole (PROTONIX) 40 MG tablet Take 1 tablet (40 mg total) by mouth daily. 30 tablet 4   pantoprazole (PROTONIX) 40 MG tablet Take 1 tablet (40 mg total) by mouth daily. 90 tablet 4   potassium chloride SA (KLOR-CON M) 20 MEQ tablet TAKE 1 TABLET(20 MEQ) BY MOUTH DAILY 90 tablet 3   Pyridoxine HCl (VITAMIN B-6 PO) Take 1 tablet by mouth daily.     [place] 420 MG/3.5ML SOCT INJECT 1 UNIT INTO THE SKIN EVERY 30 DAYS 3.5 mL 11   TART CHERRY PO Take 1 capsule by mouth in the morning and at bedtime. (Patient not taking: Reported on 12/26/2021)     VITAMIN E PO Take 1 tablet by mouth daily. (Patient not taking: Reported on 12/26/2021)     XARELTO 20 MG TABS tablet TAKE 1 TABLET(20 MG) BY MOUTH DAILY WITH SUPPER 90 tablet 1   Zinc 100 MG TABS Take 1 tablet by mouth in the morning. (Patient not taking: Reported on 12/26/2021)     No current facility-administered medications on file prior to visit.    ALLERGIES: Allergies  Allergen Reactions   Crestor [Rosuvastatin Calcium]     Severe liver function problems   Hydrocodone-Acetaminophen Anxiety and Itching   Lidocaine Hcl Other (See Comments)    Novacaine:  Becomes very shaky   Lipitor [Atorvastatin Calcium] Other (See  Comments)    Elevated liver function    Procaine Other (See Comments)    shaking   Rosuvastatin Other (See Comments)    Severe liver function problems   Theophylline Other (See Comments)    Becomes very shaky skaking skaking   Vicodin [Hydrocodone-Acetaminophen] Itching    Itching all over   Codeine Nausea Only, Anxiety and Other (See Comments)    Also complained of dizziness.  Has same problems with oxycodone and hydrocodone Visual Disturbance, Balance Difficulty, Dizzy "Room Spinning; "Swimming" Balance, vision, nausea, dizzy, "swimming", "room spinning" Balance, vision, nausea, dizzy, "swimming", "room spinning"   Ephedrine Other (See Comments)    shaking   Other Other (See Comments)    Quinine causes nausea, dizzy   Oxycodone Other (See Comments)    Balance, vision, nausea, dizzy, "swimming", "room spinning"   Oxycodone-Acetaminophen Nausea Only, Anxiety and Other (See Comments)    Visual Disturbance, Balance Difficulty, Dizzy "Room Spinning; "Swimming"   Propoxyphene Anxiety, Nausea Only and Other (See Comments)    Visual Disturbance, Balance Difficulty, Dizzy "Room Spinning; "Swimming" Balance, vision, nausea, dizzy, "swimming", "room spinning" Balance, vision, nausea, dizzy, "swimming, "room spinning"   Quinine Derivatives Nausea Only    dizzy   Sulfa Antibiotics Nausea Only    Also complained of dizziness   Epinephrine Other (See Comments)    Patient becomes "shaky" shaking shaking   Penicillins Rash    Pt reported all over body rash in 1958 Has patient had a PCN reaction causing immediate rash, facial/tongue/throat swelling, SOB or lightheadedness with hypotension:Yes Has patient had a PCN reaction causing severe rash involving mucus membranes or skin necrosis: No Has patient had a PCN reaction that required hospitalization: No Has patient had a PCN reaction occurring within the last 10 years:No     Quinine Other (See Comments)   Theophyllines Other (See  Comments)    Patient becomes "shaky"    FAMILY  HISTORY: Family History  Problem Relation Age of Onset   Heart disease Father    Heart attack Father    Rectal cancer Father    Asthma Sister    Heart disease Brother    Heart attack Brother    Stomach cancer Neg Hx    Colon cancer Neg Hx       Objective:  Blood pressure 120/73, pulse 85, height '5\' 6"'$  (1.676 m), weight 258 lb 12.8 oz (117.4 kg), SpO2 97 %. General: No acute distress.  Patient appears well-groomed.   Head:  Normocephalic/atraumatic Eyes:  Fundi examined but not visualized Neck: supple, no paraspinal tenderness, full range of motion Heart:  Regular rate and rhythm Lungs:  Clear to auscultation bilaterally Back: No paraspinal tenderness Neurological Exam: alert and oriented to person, place, and time.  Speech fluent and not dysarthric, language intact.  CN II-XII intact. Bulk and tone normal, no rigidity; muscle strength 5/5 throughout.  No bradykinesia.  Mild postural and intention tremor in hands bilaterally.  Resting tremor not appreciated.  Sensation to light touch intact.  Deep tendon reflexes 2+ throughout.  Finger to nose testing intact.  Broad-based antalgic gait.  Romberg negative.     Metta Clines, DO  CC: Cathlean Cower, MD

## 2022-06-03 ENCOUNTER — Ambulatory Visit: Payer: Medicare Other | Admitting: Neurology

## 2022-06-03 ENCOUNTER — Encounter: Payer: Self-pay | Admitting: Neurology

## 2022-06-03 VITALS — BP 120/73 | HR 85 | Ht 66.0 in | Wt 258.8 lb

## 2022-06-03 DIAGNOSIS — G250 Essential tremor: Secondary | ICD-10-CM

## 2022-06-03 DIAGNOSIS — R2681 Unsteadiness on feet: Secondary | ICD-10-CM | POA: Diagnosis not present

## 2022-06-04 ENCOUNTER — Encounter: Payer: Self-pay | Admitting: Internal Medicine

## 2022-06-05 ENCOUNTER — Other Ambulatory Visit: Payer: Self-pay

## 2022-06-05 MED ORDER — ONDANSETRON HCL 4 MG PO TABS: 4.0000 mg | ORAL_TABLET | Freq: Three times a day (TID) | ORAL | 3 refills | Status: DC | PRN

## 2022-06-05 NOTE — Telephone Encounter (Signed)
Pt need medication refill, last refill was 2022  Noted in refill Pt need to be seen, to have an office visit to  reassessment on these symptoms since med was last refill in 2022

## 2022-06-06 NOTE — Telephone Encounter (Signed)
Spoke to pt again today, pt was just here for an OV on 1.16.24.  Please send RX for  ALPRAZolam Duanne Moron) 0.5 MG tablet   [facility] [phone]   Phone: [phone]  Fax: [phone]

## 2022-06-07 MED ORDER — ONDANSETRON HCL 4 MG PO TABS: 4.0000 mg | ORAL_TABLET | Freq: Three times a day (TID) | ORAL | 1 refills | Status: DC | PRN

## 2022-06-07 MED ORDER — ALPRAZOLAM 0.5 MG PO TABS: ORAL_TABLET | ORAL | 2 refills | Status: DC

## 2022-06-10 ENCOUNTER — Other Ambulatory Visit: Payer: Self-pay

## 2022-06-10 MED ORDER — LEVOTHYROXINE SODIUM 75 MCG PO TABS: 75.0000 ug | ORAL_TABLET | Freq: Every day | ORAL | 1 refills | Status: DC

## 2022-06-15 ENCOUNTER — Other Ambulatory Visit: Payer: Self-pay

## 2022-06-15 ENCOUNTER — Encounter (HOSPITAL_COMMUNITY): Payer: Self-pay | Admitting: *Deleted

## 2022-06-15 ENCOUNTER — Ambulatory Visit (HOSPITAL_COMMUNITY)
Admission: EM | Admit: 2022-06-15 | Discharge: 2022-06-15 | Disposition: A | Discharge status: Home / Self Care | Payer: Medicare Other | Attending: Physician Assistant | Admitting: Physician Assistant

## 2022-06-15 DIAGNOSIS — H9201 Otalgia, right ear: Secondary | ICD-10-CM

## 2022-06-15 DIAGNOSIS — L03116 Cellulitis of left lower limb: Secondary | ICD-10-CM

## 2022-06-15 MED ORDER — DOXYCYCLINE HYCLATE 100 MG PO CAPS: 100.0000 mg | ORAL_CAPSULE | Freq: Two times a day (BID) | ORAL | 0 refills | Status: DC

## 2022-06-15 NOTE — ED Provider Notes (Signed)
[place]   MRN: [id number] DOB: Jul 03, 1935  Subjective:   [name] is a 87 y.o. female presenting for concerns about cellulitis of her left lower extremity.  States that she had noticed a blister on the front of her leg a few days ago and this popped and started draining.  In the last 2 days she now has redness and some pain with the leg.  No fever or chills.  She has been keeping the area clean and dry.  She is also complaining about some pain in her right ear and would like this checked today.  No other concerns to address.  No current facility-administered medications for this encounter.  Current Outpatient Medications:    doxycycline (VIBRAMYCIN) 100 MG capsule, Take 1 capsule (100 mg total) by mouth 2 (two) times daily., Disp: 20 capsule, Rfl: 0   albuterol (PROVENTIL) (2.5 MG/3ML) 0.083% nebulizer solution, Take 3 mLs (2.5 mg total) by nebulization every 6 (six) hours as needed for wheezing or shortness of breath., Disp: 75 mL, Rfl: 12   allopurinol (ZYLOPRIM) 300 MG tablet, Take 300 mg by mouth daily., Disp: , Rfl:    ALPRAZolam (XANAX) 0.5 MG tablet, TAKE 1 TO 2 TABLETS BY MOUTH TWICE DAILY AS NEEDED FOR ANXIETY OR SLEEP, Disp: 60 tablet, Rfl: 2   AMBULATORY NON FORMULARY MEDICATION, Medication Name: Prednisone Injection in knee, Disp: , Rfl:    azelastine (ASTELIN) 0.1 % nasal spray, Place 2 sprays into both nostrils daily. Use in each nostril as directed (Patient taking differently: Place 2 sprays into both nostrils in the morning.), Disp: 30 mL, Rfl: 5   co-enzyme Q-10 50 MG capsule, Take 100 mg by mouth daily., Disp: , Rfl:    colchicine 0.6 MG tablet, Take 0.6 mg by mouth daily as needed (flare  up)., Disp: , Rfl:    cycloSPORINE (RESTASIS) 0.05 % ophthalmic emulsion, Apply 1 drop to eye 2 (two) times daily as needed (allergies)., Disp: , Rfl:    EPINEPHrine 0.3 mg/0.3 mL IJ SOAJ injection, Inject 0.3 mg into the muscle as needed for anaphylaxis., Disp: ,  Rfl:    ezetimibe (ZETIA) 10 MG tablet, Take 10 mg by mouth 2 (two) times a week. Take on Tues & Fri, Disp: , Rfl:    fexofenadine (ALLERGY RELIEF) 180 MG tablet, Take 1 tablet (180 mg total) by mouth daily., Disp: 90 tablet, Rfl: 5   fluticasone (FLONASE) 50 MCG/ACT nasal spray, Place 1 spray into both nostrils every morning., Disp: , Rfl:    fluticasone-salmeterol (ADVAIR) 500-50 MCG/ACT AEPB, Inhale 1 puff into the lungs in the morning and at bedtime., Disp: , Rfl:    Homeopathic Products (ARNICARE) GEL, Apply 1 application  topically daily as needed (soreness)., Disp: , Rfl:    hydrochlorothiazide (HYDRODIURIL) 25 MG tablet, TAKE 2 TABLETS(50 MG) BY MOUTH DAILY, Disp: 180 tablet, Rfl: 1   levalbuterol (XOPENEX HFA) 45 MCG/ACT inhaler, Inhale 2 puffs into the lungs every 6 (six) hours as needed for wheezing or shortness of breath., Disp: 3 Inhaler, Rfl: 3   levocetirizine (XYZAL) 5 MG tablet, Take 1 tablet (5 mg total) by mouth every evening., Disp: 30 tablet, Rfl: 11   levothyroxine (SYNTHROID) 75 MCG tablet, Take 1 tablet (75 mcg total) by mouth daily before breakfast., Disp: 90 tablet, Rfl: 1   metoprolol succinate (TOPROL-XL) 25 MG 24 hr tablet, TAKE 1/2 TABLET(12.5 MG) BY MOUTH DAILY, Disp: 60 tablet, Rfl: 0   mirtazapine (REMERON) 15  MG tablet, TAKE 1 TABLET(15 MG) BY MOUTH AT BEDTIME, Disp: 30 tablet, Rfl: 2   montelukast (SINGULAIR) 10 MG tablet, Take 1 tablet (10 mg total) by mouth at bedtime., Disp: 90 tablet, Rfl: 1   ondansetron (ZOFRAN) 4 MG tablet, Take 1 tablet (4 mg total) by mouth every 8 (eight) hours as needed for nausea or vomiting., Disp: 40 tablet, Rfl: 1   pantoprazole (PROTONIX) 40 MG tablet, Take 1 tablet (40 mg total) by mouth daily., Disp: 30 tablet, Rfl: 4   pantoprazole (PROTONIX) 40 MG tablet, Take 1 tablet (40 mg total) by mouth daily., Disp: 90 tablet, Rfl: 4   potassium chloride SA (KLOR-CON M) 20 MEQ tablet, TAKE 1 TABLET(20 MEQ) BY MOUTH DAILY, Disp: 90 tablet,  Rfl: 3   REPATHA PUSHTRONEX SYSTEM 420 MG/3.5ML SOCT, INJECT 1 UNIT INTO THE SKIN EVERY 30 DAYS, Disp: 3.5 mL, Rfl: 11   XARELTO 20 MG TABS tablet, TAKE 1 TABLET(20 MG) BY MOUTH DAILY WITH SUPPER, Disp: 90 tablet, Rfl: 1   Allergies  Allergen Reactions   Crestor [Rosuvastatin Calcium]     Severe liver function problems   Hydrocodone-Acetaminophen Anxiety and Itching   Lidocaine Hcl Other (See Comments)    Novacaine:  Becomes very shaky   Lipitor [Atorvastatin Calcium] Other (See Comments)    Elevated liver function    Procaine Other (See Comments)    shaking   Rosuvastatin Other (See Comments)    Severe liver function problems   Theophylline Other (See Comments)    Becomes very shaky skaking skaking   Vicodin [Hydrocodone-Acetaminophen] Itching    Itching all over   Codeine Nausea Only, Anxiety and Other (See Comments)    Also complained of dizziness.  Has same problems with oxycodone and hydrocodone Visual Disturbance, Balance Difficulty, Dizzy "Room Spinning; "Swimming" Balance, vision, nausea, dizzy, "swimming", "room spinning" Balance, vision, nausea, dizzy, "swimming", "room spinning"   Ephedrine Other (See Comments)    shaking   Other Other (See Comments)    Quinine causes nausea, dizzy   Oxycodone Other (See Comments)    Balance, vision, nausea, dizzy, "swimming", "room spinning"   Oxycodone-Acetaminophen Nausea Only, Anxiety and Other (See Comments)    Visual Disturbance, Balance Difficulty, Dizzy "Room Spinning; "Swimming"   Propoxyphene Anxiety, Nausea Only and Other (See Comments)    Visual Disturbance, Balance Difficulty, Dizzy "Room Spinning; "Swimming" Balance, vision, nausea, dizzy, "swimming", "room spinning" Balance, vision, nausea, dizzy, "swimming, "room spinning"   Quinine Derivatives Nausea Only    dizzy   Sulfa Antibiotics Nausea Only    Also complained of dizziness   Epinephrine Other (See Comments)    Patient becomes "shaky" shaking shaking    Penicillins Rash    Pt reported all over body rash in 1958 Has patient had a PCN reaction causing immediate rash, facial/tongue/throat swelling, SOB or lightheadedness with hypotension:Yes Has patient had a PCN reaction causing severe rash involving mucus membranes or skin necrosis: No Has patient had a PCN reaction that required hospitalization: No Has patient had a PCN reaction occurring within the last 10 years:No     Quinine Other (See Comments)   Theophyllines Other (See Comments)    Patient becomes "shaky"    Past Medical History:  Diagnosis Date   Allergy    Anxiety    Arthritis    no cartlidge in knees   Asthma    Bradycardia    a. atenolol stopped due to this, HR 40s.   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  CAD s/p RCA stent 2000 with residual mild-mod disease of left system 2001.   Chronic atrial fibrillation (HCC)    COPD (chronic obstructive pulmonary disease) ([place])    Essential hypertension 03/24/2015   Gout    post operative   Hx of radiation therapy 04/02/11 -05/20/11   left breast   Hyperlipidemia    Hypothyroidism 03/24/2015   Incontinence of urine    Malignant neoplasm of upper-outer quadrant of female breast ([place]) 12/26/2010   Osteoporosis 03/24/2015   Peripheral neuropathy 03/24/2015   Plantar fasciitis    Sleep apnea      Past Surgical History:  Procedure Laterality Date   BREAST LUMPECTOMY W/ NEEDLE LOCALIZATION  01/29/2011   Left with [facility] Dr Margot Chimes   CHOLECYSTECTOMY  1955   CORONARY ANGIOPLASTY WITH STENT PLACEMENT  2000   DILATION AND CURETTAGE OF UTERUS  1976   and [place]   tendons between thumb and forefinger   SKIN BIOPSY     1994, 2006, 2008, 2010, 2011, 2011, 2012-  pre-cancerous   TONSILLECTOMY  1955    Family History  Problem Relation Age of Onset   Heart disease Father    Heart attack Father    Rectal cancer Father    Asthma Sister    Heart disease Brother     Heart attack Brother    Stomach cancer Neg Hx    Colon cancer Neg Hx     Social History   Tobacco Use   Smoking status: Former    Packs/day: 1.00    Years: 4.00    Total pack years: 4.00    Types: Cigarettes    Quit date: 12/05/1962    Years since quitting: 59.5   Smokeless tobacco: Never  Vaping Use   Vaping Use: Never used  Substance Use Topics   Alcohol use: No   Drug use: No    ROS REFER TO HPI FOR PERTINENT POSITIVES AND NEGATIVES   Objective:   Vitals: BP 129/71   Pulse 73   Temp 98.7 F (37.1 C)   Resp 20   SpO2 93%   Physical Exam Vitals and nursing note reviewed.  Constitutional:      Appearance: Normal appearance.  HENT:     Right Ear: Tympanic membrane, ear canal and external ear normal. There is no impacted cerumen.     Left Ear: Tympanic membrane, ear canal and external ear normal. There is no impacted cerumen.  Eyes:     Extraocular Movements: Extraocular movements intact.     Conjunctiva/sclera: Conjunctivae normal.     Pupils: Pupils are equal, round, and reactive to light.  Skin:    Findings: Erythema present.     Comments: Anterior left lower leg there is an opened blister with surrounding erythema. Tender to palpation. N/V intact. Moves knee and ankle normally. See photo.   Neurological:     Mental Status: She is alert.  Psychiatric:        Mood and Affect: Mood normal.        Behavior: Behavior normal.        No results found for this or any previous visit (from the past 24 hour(s)).  Assessment and Plan :   PDMP not reviewed this encounter.  1. Cellulitis of leg, left   2. Right ear pain    Definitely cellulitis left lower extremity.  No systemic  symptoms.  Plan to treat with doxycycline at this time.  She will take her first dose tonight.  She needs to try to rest and elevate this leg.  She needs to monitor this area very closely. Tylenol or ibuprofen as needed for pain. If progressive redness, swelling, pain, or fever, she  will go to the ED. She will f/up with PCP next week.  Reassured pt her R ear exam is normal. May have some element of TMJ.     AllwardtRanda Evens, PA-C 06/15/22 1813

## 2022-06-15 NOTE — ED Triage Notes (Signed)
PTreports having a blister on LT lower leg. The blister drained . Pt also has Pain in RT ear.

## 2022-06-15 NOTE — Discharge Instructions (Addendum)
You have cellulitis of the left leg.  Please monitor this area very closely.  Should the redness start extending above your knee, pain suddenly worsens, you develop a fever, or other acute concerns, you need to present to the nearest emergency department.  Take the doxycycline as directed.  Your ear is normal on exam.  You may want to consult with your dentist or PCP about this.

## 2022-06-20 DIAGNOSIS — J441 Chronic obstructive pulmonary disease with (acute) exacerbation: Secondary | ICD-10-CM | POA: Diagnosis not present

## 2022-06-20 DIAGNOSIS — J9601 Acute respiratory failure with hypoxia: Secondary | ICD-10-CM | POA: Diagnosis not present

## 2022-06-26 DIAGNOSIS — M1711 Unilateral primary osteoarthritis, right knee: Secondary | ICD-10-CM | POA: Diagnosis not present

## 2022-06-26 DIAGNOSIS — M1712 Unilateral primary osteoarthritis, left knee: Secondary | ICD-10-CM | POA: Diagnosis not present

## 2022-06-26 DIAGNOSIS — M170 Bilateral primary osteoarthritis of knee: Secondary | ICD-10-CM | POA: Diagnosis not present

## 2022-07-05 ENCOUNTER — Other Ambulatory Visit: Payer: Self-pay | Admitting: Interventional Cardiology

## 2022-07-05 ENCOUNTER — Other Ambulatory Visit: Payer: Self-pay | Admitting: Internal Medicine

## 2022-07-05 DIAGNOSIS — I2510 Atherosclerotic heart disease of native coronary artery without angina pectoris: Secondary | ICD-10-CM

## 2022-07-05 DIAGNOSIS — E782 Mixed hyperlipidemia: Secondary | ICD-10-CM

## 2022-07-05 NOTE — Telephone Encounter (Signed)
Please refill as per office routine med refill policy (all routine meds to be refilled for 3 mo or monthly (per pt preference) up to one year from last visit, then month to month grace period for 3 mo, then further med refills will have to be denied) ? ?

## 2022-07-19 DIAGNOSIS — J9601 Acute respiratory failure with hypoxia: Secondary | ICD-10-CM | POA: Diagnosis not present

## 2022-07-19 DIAGNOSIS — J441 Chronic obstructive pulmonary disease with (acute) exacerbation: Secondary | ICD-10-CM | POA: Diagnosis not present

## 2022-07-24 ENCOUNTER — Telehealth: Payer: Self-pay | Admitting: Gastroenterology

## 2022-07-24 ENCOUNTER — Ambulatory Visit (INDEPENDENT_AMBULATORY_CARE_PROVIDER_SITE_OTHER): Payer: Medicare Other | Admitting: Internal Medicine

## 2022-07-24 VITALS — BP 126/68 | HR 88 | Temp 97.7°F | Ht 66.0 in | Wt 240.0 lb

## 2022-07-24 DIAGNOSIS — F419 Anxiety disorder, unspecified: Secondary | ICD-10-CM

## 2022-07-24 DIAGNOSIS — J301 Allergic rhinitis due to pollen: Secondary | ICD-10-CM | POA: Diagnosis not present

## 2022-07-24 DIAGNOSIS — E559 Vitamin D deficiency, unspecified: Secondary | ICD-10-CM

## 2022-07-24 DIAGNOSIS — E7800 Pure hypercholesterolemia, unspecified: Secondary | ICD-10-CM | POA: Diagnosis not present

## 2022-07-24 DIAGNOSIS — F5101 Primary insomnia: Secondary | ICD-10-CM | POA: Diagnosis not present

## 2022-07-24 DIAGNOSIS — F32A Depression, unspecified: Secondary | ICD-10-CM

## 2022-07-24 DIAGNOSIS — T7840XA Allergy, unspecified, initial encounter: Secondary | ICD-10-CM

## 2022-07-24 MED ORDER — METHYLPREDNISOLONE ACETATE 80 MG/ML IJ SUSP
80.0000 mg | Freq: Once | INTRAMUSCULAR | Status: AC
  Administered 2022-07-24: 80 mg via INTRAMUSCULAR

## 2022-07-24 MED ORDER — EZETIMIBE 10 MG PO TABS: 10.0000 mg | ORAL_TABLET | Freq: Every day | ORAL | 3 refills | Status: DC

## 2022-07-24 NOTE — Telephone Encounter (Signed)
PT is calling to get a refill for mirtazapine. It should be sent to [facility] on [facility]

## 2022-07-24 NOTE — Patient Instructions (Addendum)
You had the steroid shot today  Please take all new medication as prescribed - the mirtazipine 15 mg at bedtime, and the generic Zetia 10 mg per day  Please continue all other medications as before, and refills have been done if requested.  Please have the pharmacy call with any other refills you may need.  Please continue your efforts at being more active, low cholesterol diet, and weight control.  Please keep your appointments with your specialists as you may have planned  Please make an Appointment to return in July 16, or sooner if needed

## 2022-07-24 NOTE — Progress Notes (Signed)
Patient ID: [name], female   DOB: 12-Jul-1935, 88 y.o.   MRN: [id number]        Chief Complaint: follow up allergies, insomonia with lower appetite and anxiety depression, low vit d, hld       HPI:  [name] is a 87 y.o. female here with c/o mild worsening insomnia with low appetite anxiety depression asking for restart mirtazipine,  Does have several wks ongoing nasal allergy symptoms with clearish congestion, itch and sneezing, without fever, pain, ST, cough, swelling or wheezing.  Now back to taking repatha shots, willing to restart zetia 10 mg.          Wt Readings from Last 3 Encounters:  07/24/22 240 lb (108.9 kg)  06/03/22 258 lb 12.8 oz (117.4 kg)  05/21/22 237 lb (107.5 kg)   BP Readings from Last 3 Encounters:  07/24/22 126/68  06/15/22 129/71  06/03/22 120/73         Past Medical History:  Diagnosis Date   Allergy    Anxiety    Arthritis    no cartlidge in knees   Asthma    Bradycardia    a. atenolol stopped due to this, HR 40s.   Breast cancer ([place]) Receptor + her2 _ 12/26/2010   left   CAD in native artery    a.  CAD s/p RCA stent 2000 with residual mild-mod disease of left system 2001.   Chronic atrial fibrillation (HCC)    COPD (chronic obstructive pulmonary disease) ([place])    Essential hypertension 03/24/2015   Gout    post operative   Hx of radiation therapy 04/02/11 -05/20/11   left breast   Hyperlipidemia    Hypothyroidism 03/24/2015   Incontinence of urine    Malignant neoplasm of upper-outer quadrant of female breast ([place]) 12/26/2010   Osteoporosis 03/24/2015   Peripheral neuropathy 03/24/2015   Plantar fasciitis    Sleep apnea    Past Surgical History:  Procedure Laterality Date   BREAST LUMPECTOMY W/ NEEDLE LOCALIZATION  01/29/2011   Left with [facility] Dr Margot Chimes   CHOLECYSTECTOMY  1955   CORONARY ANGIOPLASTY WITH STENT PLACEMENT  2000   DILATION AND CURETTAGE OF UTERUS  1976   and 1996   [place]   tendons between thumb and  forefinger   SKIN BIOPSY     1994, 2006, 2008, 2010, 2011, 2011, 2012-  pre-cancerous   TONSILLECTOMY  1955    reports that she quit smoking about 59 years ago. Her smoking use included cigarettes. She has a 4.00 pack-year smoking history. She has never used smokeless tobacco. She reports that she does not drink alcohol and does not use drugs. family history includes Asthma in her sister; Heart attack in her brother and father; Heart disease in her brother and father; Rectal cancer in her father. Allergies  Allergen Reactions   Crestor [Rosuvastatin Calcium]     Severe liver function problems   Hydrocodone-Acetaminophen Anxiety and Itching   Lidocaine Hcl Other (See Comments)    Novacaine:  Becomes very shaky   Lipitor [Atorvastatin Calcium] Other (See Comments)    Elevated liver function    Procaine Other (See Comments)    shaking   Rosuvastatin Other (See Comments)    Severe liver function problems   Theophylline Other (See Comments)    Becomes very shaky skaking skaking   Vicodin [Hydrocodone-Acetaminophen] Itching    Itching all over   Codeine Nausea Only, Anxiety and Other (See Comments)  Also complained of dizziness.  Has same problems with oxycodone and hydrocodone Visual Disturbance, Balance Difficulty, Dizzy "Room Spinning; "Swimming" Balance, vision, nausea, dizzy, "swimming", "room spinning" Balance, vision, nausea, dizzy, "swimming", "room spinning"   Ephedrine Other (See Comments)    shaking   Other Other (See Comments)    Quinine causes nausea, dizzy   Oxycodone Other (See Comments)    Balance, vision, nausea, dizzy, "swimming", "room spinning"   Oxycodone-Acetaminophen Nausea Only, Anxiety and Other (See Comments)    Visual Disturbance, Balance Difficulty, Dizzy "Room Spinning; "Swimming"   Propoxyphene Anxiety, Nausea Only and Other (See Comments)    Visual Disturbance, Balance Difficulty, Dizzy "Room Spinning; "Swimming" Balance, vision, nausea, dizzy,  "swimming", "room spinning" Balance, vision, nausea, dizzy, "swimming, "room spinning"   Quinine Derivatives Nausea Only    dizzy   Sulfa Antibiotics Nausea Only    Also complained of dizziness   Epinephrine Other (See Comments)    Patient becomes "shaky" shaking shaking   Penicillins Rash    Pt reported all over body rash in 1958 Has patient had a PCN reaction causing immediate rash, facial/tongue/throat swelling, SOB or lightheadedness with hypotension:Yes Has patient had a PCN reaction causing severe rash involving mucus membranes or skin necrosis: No Has patient had a PCN reaction that required hospitalization: No Has patient had a PCN reaction occurring within the last 10 years:No     Quinine Other (See Comments)   Theophyllines Other (See Comments)    Patient becomes "shaky"   Current Outpatient Medications on File Prior to Visit  Medication Sig Dispense Refill   albuterol (PROVENTIL) (2.5 MG/3ML) 0.083% nebulizer solution Take 3 mLs (2.5 mg total) by nebulization every 6 (six) hours as needed for wheezing or shortness of breath. 75 mL 12   allopurinol (ZYLOPRIM) 300 MG tablet Take 300 mg by mouth daily.     ALPRAZolam (XANAX) 0.5 MG tablet TAKE 1 TO 2 TABLETS BY MOUTH TWICE DAILY AS NEEDED FOR ANXIETY OR SLEEP 60 tablet 2   AMBULATORY NON FORMULARY MEDICATION Medication Name: Prednisone Injection in knee     azelastine (ASTELIN) 0.1 % nasal spray Place 2 sprays into both nostrils daily. Use in each nostril as directed (Patient taking differently: Place 2 sprays into both nostrils in the morning.) 30 mL 5   co-enzyme Q-10 50 MG capsule Take 100 mg by mouth daily.     colchicine 0.6 MG tablet Take 0.6 mg by mouth daily as needed (flare  up).     cycloSPORINE (RESTASIS) 0.05 % ophthalmic emulsion Apply 1 drop to eye 2 (two) times daily as needed (allergies).     doxycycline (VIBRAMYCIN) 100 MG capsule Take 1 capsule (100 mg total) by mouth 2 (two) times daily. 20 capsule 0    EPINEPHrine 0.3 mg/0.3 mL IJ SOAJ injection Inject 0.3 mg into the muscle as needed for anaphylaxis.     Evolocumab with Infusor ([place]) 420 MG/3.5ML SOCT INJECT 1 UNIT INTO THE SKIN EVERY 30 DAYS 3.5 mL 2   fexofenadine (ALLERGY RELIEF) 180 MG tablet Take 1 tablet (180 mg total) by mouth daily. 90 tablet 5   fluticasone (FLONASE) 50 MCG/ACT nasal spray Place 1 spray into both nostrils every morning.     fluticasone-salmeterol (ADVAIR) 500-50 MCG/ACT AEPB Inhale 1 puff into the lungs in the morning and at bedtime.     gabapentin (NEURONTIN) 100 MG capsule Take by mouth.     Homeopathic Products (ARNICARE) GEL Apply 1 application  topically daily as  needed (soreness).     hydrochlorothiazide (HYDRODIURIL) 25 MG tablet TAKE 2 TABLETS(50 MG) BY MOUTH DAILY 180 tablet 1   levalbuterol (XOPENEX HFA) 45 MCG/ACT inhaler Inhale 2 puffs into the lungs every 6 (six) hours as needed for wheezing or shortness of breath. 3 Inhaler 3   levocetirizine (XYZAL) 5 MG tablet Take 1 tablet (5 mg total) by mouth every evening. 30 tablet 11   levothyroxine (SYNTHROID) 75 MCG tablet Take 1 tablet (75 mcg total) by mouth daily before breakfast. 90 tablet 1   metoprolol succinate (TOPROL-XL) 25 MG 24 hr tablet TAKE 1/2 TABLET BY MOUTH EVERY DAY. Please contact our office to schedule an overdue office visit for future refills. [phone]. Thank you. 1st attempt. 15 tablet 0   montelukast (SINGULAIR) 10 MG tablet Take 1 tablet (10 mg total) by mouth at bedtime. 90 tablet 1   ondansetron (ZOFRAN) 4 MG tablet Take 1 tablet (4 mg total) by mouth every 8 (eight) hours as needed for nausea or vomiting. 40 tablet 1   pantoprazole (PROTONIX) 40 MG tablet Take 1 tablet (40 mg total) by mouth daily. 30 tablet 4   pantoprazole (PROTONIX) 40 MG tablet Take 1 tablet (40 mg total) by mouth daily. 90 tablet 4   potassium chloride SA (KLOR-CON M) 20 MEQ tablet TAKE 1 TABLET BY MOUTH DAILY 90 tablet 3   traMADol  (ULTRAM) 50 MG tablet Take by mouth.     XARELTO 20 MG TABS tablet TAKE 1 TABLET(20 MG) BY MOUTH DAILY WITH SUPPER 90 tablet 1   budesonide-formoterol (SYMBICORT) 80-4.5 MCG/ACT inhaler 2 puffs Inhalation Twice a day     fluticasone-salmeterol (ADVAIR DISKUS) 500-50 MCG/ACT AEPB 1 puff Inhalation Twice a day     rosuvastatin (CRESTOR) 10 MG tablet 1 tablet Orally Twice a week     No current facility-administered medications on file prior to visit.        ROS:  All others reviewed and negative.  Objective        PE:  BP 126/68   Pulse 88   Temp 97.7 F (36.5 C) (Oral)   Ht 5\' 6"  (1.676 m)   Wt 240 lb (108.9 kg)   SpO2 96%   BMI 38.74 kg/m                 Constitutional: Pt appears in NAD               HENT: Head: NCAT.                Right Ear: External ear normal.                 Left Ear: External ear normal.                Eyes: . Pupils are equal, round, and reactive to light. Conjunctivae and EOM are normal               Nose: without d/c or deformity               Neck: Neck supple. Gross normal ROM               Cardiovascular: Normal rate and regular rhythm.                 Pulmonary/Chest: Effort normal and breath sounds without rales or wheezing.                Abd:  Soft, NT, ND, +  BS, no organomegaly               Neurological: Pt is alert. At baseline orientation, motor grossly intact               Skin: Skin is warm. No rashes, no other new lesions, LE edema - none               Psychiatric: Pt behavior is normal without agitation   Micro: none  Cardiac tracings I have personally interpreted today:  none  Pertinent Radiological findings (summarize): none   Lab Results  Component Value Date   WBC 6.6 05/21/2022   HGB 13.2 05/21/2022   HCT 39.3 05/21/2022   PLT 149.0 (L) 05/21/2022   GLUCOSE 96 05/21/2022   CHOL 199 05/21/2022   TRIG 111.0 05/21/2022   HDL 60.70 05/21/2022   LDLDIRECT 107.0 09/17/2016   LDLCALC 116 (H) 05/21/2022   ALT 12 05/21/2022    AST 23 05/21/2022   NA 140 05/21/2022   K 3.6 05/21/2022   CL 102 05/21/2022   CREATININE 1.13 05/21/2022   BUN 24 (H) 05/21/2022   CO2 31 05/21/2022   TSH 1.76 05/21/2022   INR 2.1 (H) 03/21/2022   HGBA1C 5.4 05/21/2022   MICROALBUR 1.7 05/21/2022   Assessment/Plan:  [name] is a 87 y.o. White or Caucasian [1] female with  has a past medical history of Allergy, Anxiety, Arthritis, Asthma, Bradycardia, Breast cancer ([place]) Receptor + her2 _ (12/26/2010), CAD in native artery, Chronic atrial fibrillation ([place]), COPD (chronic obstructive pulmonary disease) ([place]), Essential hypertension (03/24/2015), Gout, radiation therapy (04/02/11 -05/20/11), Hyperlipidemia, Hypothyroidism (03/24/2015), Incontinence of urine, Malignant neoplasm of upper-outer quadrant of female breast ([place]) (12/26/2010), Osteoporosis (03/24/2015), Peripheral neuropathy (03/24/2015), Plantar fasciitis, and Sleep apnea.  Allergic rhinitis With seasonal flare despite allegra, flonase and azelastine, now for depomedrol IM 80 mg x 1  Anxiety and depression With insomnia and low appetite - for restart mirtazipine 15 qhs prn  Insomnia For mirtazipine restart as above  Pure hypercholesterolemia Lab Results  Component Value Date   LDLCALC 116 (H) 05/21/2022   Uncontrolled, now on repatha, to also restart zetia 10 mg qd    Vitamin D deficiency Last vitamin D Lab Results  Component Value Date   VD25OH 35.51 05/21/2022   otherwise, to start oral replacement  Followup: Return in about 4 months (around 11/19/2022).  Cathlean Cower, MD 07/27/2022 6:51 PM [facility]

## 2022-07-24 NOTE — Telephone Encounter (Signed)
Yes, can do 90 day, 1 refill. Make sure has follow in next 6 months. Thanks! Estill Bamberg

## 2022-07-24 NOTE — Telephone Encounter (Signed)
[name] it says it was filled by you. Can I refill her Remeron?

## 2022-07-25 MED ORDER — MIRTAZAPINE 15 MG PO TABS: ORAL_TABLET | ORAL | 1 refills | Status: DC

## 2022-07-25 NOTE — Telephone Encounter (Signed)
Notified on prescription and also a recall was placed

## 2022-07-27 ENCOUNTER — Encounter: Payer: Self-pay | Admitting: Internal Medicine

## 2022-07-27 DIAGNOSIS — G4700 Insomnia, unspecified: Secondary | ICD-10-CM | POA: Insufficient documentation

## 2022-07-27 DIAGNOSIS — E559 Vitamin D deficiency, unspecified: Secondary | ICD-10-CM | POA: Insufficient documentation

## 2022-07-27 NOTE — Assessment & Plan Note (Signed)
With insomnia and low appetite - for restart mirtazipine 15 qhs prn

## 2022-07-27 NOTE — Assessment & Plan Note (Signed)
Lab Results  Component Value Date   LDLCALC 116 (H) 05/21/2022   Uncontrolled, now on repatha, to also restart zetia 10 mg qd

## 2022-07-27 NOTE — Assessment & Plan Note (Signed)
Last vitamin D Lab Results  Component Value Date   VD25OH 35.51 05/21/2022   otherwise, to start oral replacement

## 2022-07-27 NOTE — Assessment & Plan Note (Signed)
For mirtazipine restart as above

## 2022-07-27 NOTE — Assessment & Plan Note (Signed)
With seasonal flare despite allegra, flonase and azelastine, now for depomedrol IM 80 mg x 1

## 2022-08-02 ENCOUNTER — Other Ambulatory Visit: Payer: Self-pay | Admitting: Interventional Cardiology

## 2022-08-19 ENCOUNTER — Encounter: Payer: Self-pay | Admitting: *Deleted

## 2022-08-19 DIAGNOSIS — J441 Chronic obstructive pulmonary disease with (acute) exacerbation: Secondary | ICD-10-CM | POA: Diagnosis not present

## 2022-08-19 DIAGNOSIS — J9601 Acute respiratory failure with hypoxia: Secondary | ICD-10-CM | POA: Diagnosis not present

## 2022-08-22 ENCOUNTER — Other Ambulatory Visit: Payer: Self-pay | Admitting: Internal Medicine

## 2022-08-22 DIAGNOSIS — G4733 Obstructive sleep apnea (adult) (pediatric): Secondary | ICD-10-CM | POA: Diagnosis not present

## 2022-08-22 DIAGNOSIS — J441 Chronic obstructive pulmonary disease with (acute) exacerbation: Secondary | ICD-10-CM | POA: Diagnosis not present

## 2022-08-26 ENCOUNTER — Other Ambulatory Visit: Payer: Self-pay | Admitting: Internal Medicine

## 2022-08-26 DIAGNOSIS — M1711 Unilateral primary osteoarthritis, right knee: Secondary | ICD-10-CM | POA: Diagnosis not present

## 2022-08-26 DIAGNOSIS — M1712 Unilateral primary osteoarthritis, left knee: Secondary | ICD-10-CM | POA: Diagnosis not present

## 2022-08-26 DIAGNOSIS — M170 Bilateral primary osteoarthritis of knee: Secondary | ICD-10-CM | POA: Diagnosis not present

## 2022-08-29 DIAGNOSIS — M1009 Idiopathic gout, multiple sites: Secondary | ICD-10-CM | POA: Diagnosis not present

## 2022-08-29 DIAGNOSIS — M1991 Primary osteoarthritis, unspecified site: Secondary | ICD-10-CM | POA: Diagnosis not present

## 2022-08-29 DIAGNOSIS — Z79899 Other long term (current) drug therapy: Secondary | ICD-10-CM | POA: Diagnosis not present

## 2022-09-04 ENCOUNTER — Other Ambulatory Visit: Payer: Self-pay

## 2022-09-09 ENCOUNTER — Telehealth: Payer: Self-pay | Admitting: Emergency Medicine

## 2022-09-09 NOTE — Telephone Encounter (Signed)
Patient states needs oxygen equipment ant tanks picked up. No longer using them, Equipment thru [employer]. Patient phone number is [phone].

## 2022-09-12 DIAGNOSIS — L0202 Furuncle of face: Secondary | ICD-10-CM | POA: Diagnosis not present

## 2022-09-12 DIAGNOSIS — L570 Actinic keratosis: Secondary | ICD-10-CM | POA: Diagnosis not present

## 2022-09-12 DIAGNOSIS — L578 Other skin changes due to chronic exposure to nonionizing radiation: Secondary | ICD-10-CM | POA: Diagnosis not present

## 2022-09-12 NOTE — Telephone Encounter (Signed)
Attempted to call patient.  No answer and phone disconnects after voice mail message.  Cannot leave VM.  Patient called 04/11/2022 to stop her oxygen.  Per chart TC on 04/11/2022:  Mercedes Peer, MD   04/11/22  4:57 PM Note The best way to establish whether she needs to continue to use and have oxygen is to arrange for a walking oximetry, either by [place] or by her DME. That is my recommendation. If she wants her O2 stopped no matter whether we recommend it, then you can write an order to discontinue. Just note that it was patient's preference.

## 2022-09-13 NOTE — Telephone Encounter (Signed)
Attempted to call patient x 2.  Phone VM cuts off after recording. Unable to leave message.  Will close encounter d/t unable to leave message.

## 2022-09-18 DIAGNOSIS — J441 Chronic obstructive pulmonary disease with (acute) exacerbation: Secondary | ICD-10-CM | POA: Diagnosis not present

## 2022-09-18 DIAGNOSIS — J9601 Acute respiratory failure with hypoxia: Secondary | ICD-10-CM | POA: Diagnosis not present

## 2022-09-30 NOTE — Progress Notes (Unsigned)
Cardiology Office Note:    Date:  10/02/2022   ID:  [name], DOB [birth year], MRN [id number]  PCP:  [name], MD   [facility] Providers Cardiologist:  Lance Muss, MD     Referring MD: [name], MD   Chief Complaint: follow-up a fib  History of Present Illness:    [name] is a very pleasant 87 y.o. female with a hx of persistent atrial fibrillation, morbid obesity, CAD s/p RCA stent 2000 with residual mild-mod disease of left system 2001, HLD, breast cancer, asthma, anxiety, HTN, gout, OSA, arthritis, hypothyroidism, bradycardia, and LE edema.  Last cardiac catheterization 2001 showed 50% mD1, 20 to 25% LCx, 20 to 25% ISR of RCA stent.  Nuclear stress test 01/2011 showed small partially reversible defect in anterior lateral area, could represent ischemia but could be due to breast attenuation, EF 67%.  Noted to be bradycardic in the 40s and 03/2017 so atenolol was stopped.  She noticed after stopping atenolol that she could feel her heart pounding faster when she did activity so low-dose metoprolol 12.5 mg twice daily was added back.  She had an abscess on her left shin and was told she had a vein problem.  Gets cellulitis at times in that area and uses antibiotics.  Feeling well in 2020.  Zetia was stopped. Repatha was continued.  History of anxiety issues that resolved with Xanax.  She has continued issues with asthma.  In July 2021 she had vascular congestion noted on CXR.  Echo was done which showed LVEF 60 to 65%, no RWMA, indeterminate diastolic parameters, elevated LVEDP, moderately elevated PASP, severely dilated LA, mildly dilated RA, no significant valve disease.  Admitted 06/2021 with pneumonia with elevated troponin downward trending, suspect demand ischemia. No significant change from previous echo, RA pressure increased.  Last cardiology clinic visit was 06/22/2021 with Dr. Eldridge Dace.  No significant cardiac symptoms. Daughter was encouraged to  undergo calcium scoring given both parents have CAD. Recommendation to follow-up in 6 months.  Today, she is here alone for follow-up. Ambulates with rolling walker. Reports she is feeling well. Had trouble eating after her husband passed away Christmas 2022. Was started on Remeron to increase appetite and reports now she has a very good appetite and has resumed social activities. Tries to avoid sodium, eats lots of fruits and vegetables. She frequently speaks at her church. Continues to live alone and to drive, daughters are close by. Has had bilateral knee pain for some time, but recently resumed walking with a rolling walker.  Admits she does not get much exercise. Bruises easily but no significant bleeding concerns.  She denies chest pain, shortness of breath, edema, orthopnea, PND, palpitations, presyncope, syncope.  Past Medical History:  Diagnosis Date   Allergy    Anxiety    Arthritis    no cartlidge in knees   Asthma    Bradycardia    a. atenolol stopped due to this, HR 40s.   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  CAD s/p RCA stent 2000 with residual mild-mod disease of left system 2001.   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) 12/26/2010   Osteoporosis 03/24/2015   Peripheral neuropathy 03/24/2015   Plantar fasciitis    Sleep apnea     Past Surgical History:  Procedure Laterality Date   BREAST LUMPECTOMY W/ NEEDLE LOCALIZATION  01/29/2011   Left with [facility] Dr Jamey Ripa   CHOLECYSTECTOMY  1955   CORONARY ANGIOPLASTY WITH STENT PLACEMENT  2000   DILATION AND CURETTAGE OF UTERUS  1976   and 1996   HAND SURGERY  1992    tendons between thumb and forefinger   SKIN BIOPSY     1994, 2006, 2008, 2010, 2011, 2011, 2012-  pre-cancerous   TONSILLECTOMY  1955    Current Medications: Current Meds  Medication Sig   albuterol (PROVENTIL) (2.5 MG/3ML) 0.083% nebulizer solution Take 3 mLs (2.5 mg total) by nebulization every 6 (six) hours as needed for wheezing or shortness of breath.   allopurinol (ZYLOPRIM) 300 MG tablet Take 300 mg by mouth daily.   ALPRAZolam (XANAX) 0.5 MG tablet TAKE 1 TO 2 TABLETS BY MOUTH TWICE DAILY AS NEEDED FOR ANXIETY OR SLEEP   AMBULATORY NON FORMULARY MEDICATION Medication Name: Prednisone Injection in knee   azelastine (ASTELIN) 0.1 % nasal spray Place 2 sprays into both nostrils daily. Use in each nostril as directed (Patient taking differently: Place 2 sprays into both nostrils in the morning.)   budesonide-formoterol (SYMBICORT) 80-4.5 MCG/ACT inhaler 2 puffs Inhalation Twice a day   co-enzyme Q-10 50 MG capsule Take 100 mg by mouth daily.   colchicine 0.6 MG tablet Take 0.6 mg by mouth daily as needed (flare  up).   cycloSPORINE (RESTASIS) 0.05 % ophthalmic emulsion Apply 1 drop to eye 2 (two) times daily as needed (allergies).   doxycycline (VIBRAMYCIN) 100 MG capsule Take 1 capsule (100 mg total) by mouth 2 (two) times daily.   EPINEPHrine 0.3 mg/0.3 mL IJ SOAJ injection Inject 0.3 mg into the muscle as needed for anaphylaxis.   Evolocumab with Infusor (REPATHA PUSHTRONEX SYSTEM) 420 MG/3.5ML SOCT INJECT 1 UNIT INTO THE SKIN EVERY 30 DAYS   ezetimibe (ZETIA) 10 MG tablet Take 1 tablet (10 mg total) by mouth daily. Take on Tues & Fri   fexofenadine (ALLERGY RELIEF) 180 MG tablet Take 1 tablet (180 mg total) by mouth daily.   fluticasone (FLONASE) 50 MCG/ACT nasal spray Place 1 spray into both nostrils every morning.   fluticasone-salmeterol (ADVAIR DISKUS) 500-50 MCG/ACT AEPB 1 puff Inhalation Twice a day   gabapentin (NEURONTIN) 100 MG capsule Take by mouth.   Homeopathic Products  (ARNICARE) GEL Apply 1 application  topically daily as needed (soreness).   hydrochlorothiazide (HYDRODIURIL) 25 MG tablet TAKE 2 TABLETS(50 MG) BY MOUTH DAILY   levalbuterol (XOPENEX HFA) 45 MCG/ACT inhaler Inhale 2 puffs into the lungs every 6 (six) hours as needed for wheezing or shortness of breath.   levocetirizine (XYZAL) 5 MG tablet Take 1 tablet (5 mg total) by mouth every evening.   levothyroxine (SYNTHROID) 75 MCG tablet Take 1 tablet (75 mcg total) by mouth daily before breakfast.   metoprolol succinate (TOPROL-XL) 25 MG 24 hr tablet TAKE 1/2 TABLET BY MOUTH EVERY DAY   mirtazapine (REMERON) 15 MG tablet TAKE 1 TABLET(15 MG) BY MOUTH AT BEDTIME Please call in September 2024 to make an November 2024 appointment for more refills   montelukast (SINGULAIR) 10 MG tablet Take 1 tablet (10 mg total) by mouth at bedtime.   ondansetron (ZOFRAN)  4 MG tablet Take 1 tablet (4 mg total) by mouth every 8 (eight) hours as needed for nausea or vomiting.   pantoprazole (PROTONIX) 40 MG tablet Take 1 tablet (40 mg total) by mouth daily.   potassium chloride SA (KLOR-CON M) 20 MEQ tablet TAKE 1 TABLET BY MOUTH DAILY   rosuvastatin (CRESTOR) 10 MG tablet 1 tablet Orally Twice a week   traMADol (ULTRAM) 50 MG tablet Take by mouth.   XARELTO 20 MG TABS tablet TAKE 1 TABLET(20 MG) BY MOUTH DAILY WITH SUPPER   [DISCONTINUED] pantoprazole (PROTONIX) 40 MG tablet Take 1 tablet (40 mg total) by mouth daily.     Allergies:   Crestor [rosuvastatin calcium], Hydrocodone-acetaminophen, Lidocaine hcl, Lipitor [atorvastatin calcium], Procaine, Rosuvastatin, Theophylline, Vicodin [hydrocodone-acetaminophen], Codeine, Ephedrine, Other, Oxycodone, Oxycodone-acetaminophen, Propoxyphene, Quinine derivatives, Sulfa antibiotics, Epinephrine, Penicillins, Quinine, and Theophyllines   Social History   Socioeconomic History   Marital status: Widowed    Spouse name: Not on file   Number of children: 3   Years of education:  Not on file   Highest education level: Not on file  Occupational History   Not on file  Tobacco Use   Smoking status: Former    Packs/day: 1.00    Years: 4.00    Additional pack years: 0.00    Total pack years: 4.00    Types: Cigarettes    Quit date: 12/05/1962    Years since quitting: 59.8   Smokeless tobacco: Never  Vaping Use   Vaping Use: Never used  Substance and Sexual Activity   Alcohol use: No   Drug use: No   Sexual activity: Not Currently  Other Topics Concern   Not on file  Social History Narrative   Right handed.   Husband passed 04/28/2021. Married x 63 years.   2 daughters, who live nearby.    Grew up in [place], at [place].    Likes to swim.    Social Determinants of Health   Financial Resource Strain: Low Risk  (11/02/2021)   Overall Financial Resource Strain (CARDIA)    Difficulty of Paying Living Expenses: Not hard at all  Food Insecurity: No Food Insecurity (11/02/2021)   Hunger Vital Sign    Worried About Running Out of Food in the Last Year: Never true    Ran Out of Food in the Last Year: Never true  Transportation Needs: No Transportation Needs (11/02/2021)   PRAPARE - Administrator, Civil Service (Medical): No    Lack of Transportation (Non-Medical): No  Physical Activity: Inactive (11/02/2021)   Exercise Vital Sign    Days of Exercise per Week: 0 days    Minutes of Exercise per Session: 0 min  Stress: No Stress Concern Present (11/02/2021)   [employer] of Occupational Health - Occupational Stress Questionnaire    Feeling of Stress : Not at all  Social Connections: Socially Integrated (11/02/2021)   Social Connection and Isolation Panel [NHANES]    Frequency of Communication with Friends and Family: More than three times a week    Frequency of Social Gatherings with Friends and Family: More than three times a week    Attends Religious Services: More than 4 times per year    Active Member of [employer] or Organizations: Yes     Attends Engineer, structural: More than 4 times per year    Marital Status: Married     Family History: The patient's family history includes Asthma in her sister; Heart attack in  her brother and father; Heart disease in her brother and father; Rectal cancer in her father. There is no history of Stomach cancer or Colon cancer.  ROS:   Please see the history of present illness.   All other systems reviewed and are negative.  Labs/Other Studies Reviewed:    The following studies were reviewed today:  Echo 06/13/21 1. Left ventricular ejection fraction, by estimation, is 60 to 65%. The  left ventricle has normal function. The left ventricle has no regional  wall motion abnormalities. There is mild concentric left ventricular  hypertrophy. Left ventricular diastolic  parameters are indeterminate.   2. Right ventricular systolic function is normal. The right ventricular  size is normal. There is moderately elevated pulmonary artery systolic  pressure.   3. Left atrial size was moderately dilated.   4. Right atrial size was mildly dilated.   5. No evidence of mitral valve regurgitation.   6. There is mild calcification of the aortic valve. Aortic valve  regurgitation is not visualized.   7. The inferior vena cava is dilated in size with <50% respiratory  variability, suggesting right atrial pressure of 15 mmHg.   Conclusion(s)/Recommendation(s): Compared to prior echo RA pressure  increased.  Recent Labs: 05/21/2022: ALT 12; BUN 24; Creatinine, Ser 1.13; Hemoglobin 13.2; Platelets 149.0; Potassium 3.6; Sodium 140; TSH 1.76  Recent Lipid Panel    Component Value Date/Time   CHOL 199 05/21/2022 1049   CHOL 133 06/09/2019 0921   TRIG 111.0 05/21/2022 1049   HDL 60.70 05/21/2022 1049   HDL 57 06/09/2019 0921   CHOLHDL 3 05/21/2022 1049   VLDL 22.2 05/21/2022 1049   LDLCALC 116 (H) 05/21/2022 1049   LDLCALC 57 06/09/2019 0921   LDLDIRECT 107.0 09/17/2016 1221     Risk  Assessment/Calculations:    CHA2DS2-VASc Score = 5   This indicates a 7.2% annual risk of stroke. The patient's score is based upon: CHF History: 0 HTN History: 1 Diabetes History: 0 Stroke History: 0 Vascular Disease History: 1 Age Score: 2 Gender Score: 1          Physical Exam:    VS:  BP 114/72   Pulse 79   Ht 5' 6.5" (1.689 m)   Wt 227 lb 12.8 oz (103.3 kg)   SpO2 97%   BMI 36.22 kg/m     Wt Readings from Last 3 Encounters:  10/02/22 227 lb 12.8 oz (103.3 kg)  07/24/22 240 lb (108.9 kg)  06/03/22 258 lb 12.8 oz (117.4 kg)     GEN: Obese well developed in no acute distress HEENT: Normal NECK: No JVD; No carotid bruits CARDIAC: Irregular RR, no murmurs, rubs, gallops RESPIRATORY:  Clear to auscultation without rales, wheezing or rhonchi  ABDOMEN: Soft, non-tender, non-distended MUSCULOSKELETAL:  No edema; No deformity. 2+ pedal pulses, equal bilaterally SKIN: Warm and dry NEUROLOGIC:  Alert and oriented x 3 PSYCHIATRIC:  Normal affect   EKG:  EKG is ordered today.  The ekg ordered today demonstrates atrial fibrillation at 65 bpm, low voltage QRS, no ST abnormality       Diagnoses:    1. Chronic atrial fibrillation (HCC)   2. Acquired thrombophilia (HCC)   3. Coronary artery disease involving native coronary artery of native heart without angina pectoris   4. Mixed hyperlipidemia    Assessment and Plan:     CAD without angina: CAD s/p RCA stent 2000 with residual mild-mod disease of left system 2001. She denies chest pain, dyspnea, or other  symptoms concerning for angina. Admits she is not very physically active but no concerning symptoms when walking into church or other appointments. No indication for further ischemic evaluation at this time. Encouraged her to increase activity by walking around inside her home.  Continue heart healthy, mostly plant-based diet.  Hyperlipidemia LDL goal < 55: LDL 116 on 1/61/0960.  She was advised to resume Zetia by  PCP. We discussed the significant change in her LDL from 09/22/2021 to 05/25/2022. Reports her appetite has improved but she avoids saturated fat, sugar, and processed foods.  Reports no missed doses of Crestor taken twice weekly or Repatha.  Does not feel like she can increase Crestor due to chronic knee and joint pain. Plans for PCP to recheck.   Longstanding persistent atrial fibrillation on chronic anticoagulation: HR is well controlled. She is overall asymptomatic. Has some bruising but no significant bleeding. Creatinine clearance calculated using Cockcroft-Gault equation at 59 mL/min.  Xarelto 20 mg is appropriate dose for stroke prevention for CHA2DS2-VASc score of 5.  Continue metoprolol for rate control.  Hypertension: BP is well controlled. No reports of orthostasis.      Disposition: 6 months with Dr. Eldridge Dace or APP  Medication Adjustments/Labs and Tests Ordered: Current medicines are reviewed at length with the patient today.  Concerns regarding medicines are outlined above.  Orders Placed This Encounter  Procedures   EKG 12-Lead   No orders of the defined types were placed in this encounter.   Patient Instructions  Medication Instructions:   Your physician recommends that you continue on your current medications as directed. Please refer to the Current Medication list given to you today.   *If you need a refill on your cardiac medications before your next appointment, please call your pharmacy*   Lab Work:  None ordered.  If you have labs (blood work) drawn today and your tests are completely normal, you will receive your results only by: MyChart Message (if you have MyChart) OR A paper copy in the mail If you have any lab test that is abnormal or we need to change your treatment, we will call you to review the results.   Testing/Procedures:  None ordered.   Follow-Up: At [facility], you and your health needs are our priority.  As part of our  continuing mission to provide you with exceptional heart care, we have created designated Provider Care Teams.  These Care Teams include your primary Cardiologist (physician) and Advanced Practice Providers (APPs -  Physician Assistants and Nurse Practitioners) who all work together to provide you with the care you need, when you need it.  We recommend signing up for the patient portal called "MyChart".  Sign up information is provided on this After Visit Summary.  MyChart is used to connect with patients for Virtual Visits (Telemedicine).  Patients are able to view lab/test results, encounter notes, upcoming appointments, etc.  Non-urgent messages can be sent to your provider as well.   To learn more about what you can do with MyChart, go to [email or web address].    Your next appointment:   6 month(s)  Provider:   Lance Muss, MD        Signed, Levi Aland, NP  10/02/2022 2:52 PM     [facility]

## 2022-10-02 ENCOUNTER — Ambulatory Visit: Payer: Medicare Other | Attending: Nurse Practitioner | Admitting: Nurse Practitioner

## 2022-10-02 ENCOUNTER — Encounter: Payer: Self-pay | Admitting: Nurse Practitioner

## 2022-10-02 VITALS — BP 114/72 | HR 79 | Ht 66.5 in | Wt 227.8 lb

## 2022-10-02 DIAGNOSIS — E782 Mixed hyperlipidemia: Secondary | ICD-10-CM | POA: Diagnosis not present

## 2022-10-02 DIAGNOSIS — I482 Chronic atrial fibrillation, unspecified: Secondary | ICD-10-CM | POA: Diagnosis not present

## 2022-10-02 DIAGNOSIS — D6869 Other thrombophilia: Secondary | ICD-10-CM

## 2022-10-02 DIAGNOSIS — I2510 Atherosclerotic heart disease of native coronary artery without angina pectoris: Secondary | ICD-10-CM | POA: Diagnosis not present

## 2022-10-02 NOTE — Patient Instructions (Signed)
Medication Instructions:   Your physician recommends that you continue on your current medications as directed. Please refer to the Current Medication list given to you today.   *If you need a refill on your cardiac medications before your next appointment, please call your pharmacy*   Lab Work:  None ordered.  If you have labs (blood work) drawn today and your tests are completely normal, you will receive your results only by: MyChart Message (if you have MyChart) OR A paper copy in the mail If you have any lab test that is abnormal or we need to change your treatment, we will call you to review the results.   Testing/Procedures:  None ordered.   Follow-Up: At [facility], you and your health needs are our priority.  As part of our continuing mission to provide you with exceptional heart care, we have created designated Provider Care Teams.  These Care Teams include your primary Cardiologist (physician) and Advanced Practice Providers (APPs -  Physician Assistants and Nurse Practitioners) who all work together to provide you with the care you need, when you need it.  We recommend signing up for the patient portal called "MyChart".  Sign up information is provided on this After Visit Summary.  MyChart is used to connect with patients for Virtual Visits (Telemedicine).  Patients are able to view lab/test results, encounter notes, upcoming appointments, etc.  Non-urgent messages can be sent to your provider as well.   To learn more about what you can do with MyChart, go to [email or web address].    Your next appointment:   6 month(s)  Provider:   Jayadeep Varanasi, MD      

## 2022-10-04 ENCOUNTER — Other Ambulatory Visit: Payer: Self-pay | Admitting: Interventional Cardiology

## 2022-10-15 ENCOUNTER — Emergency Department (HOSPITAL_COMMUNITY): Payer: Medicare Other

## 2022-10-15 ENCOUNTER — Emergency Department (HOSPITAL_COMMUNITY)
Admission: EM | Admit: 2022-10-15 | Discharge: 2022-10-16 | Disposition: A | Discharge status: Home / Self Care | Payer: Medicare Other | Attending: Emergency Medicine | Admitting: Emergency Medicine

## 2022-10-15 ENCOUNTER — Other Ambulatory Visit: Payer: Self-pay

## 2022-10-15 DIAGNOSIS — T148XXA Other injury of unspecified body region, initial encounter: Secondary | ICD-10-CM

## 2022-10-15 DIAGNOSIS — I700 Atherosclerosis of aorta: Secondary | ICD-10-CM | POA: Diagnosis not present

## 2022-10-15 DIAGNOSIS — S40012A Contusion of left shoulder, initial encounter: Secondary | ICD-10-CM | POA: Insufficient documentation

## 2022-10-15 DIAGNOSIS — I2510 Atherosclerotic heart disease of native coronary artery without angina pectoris: Secondary | ICD-10-CM | POA: Insufficient documentation

## 2022-10-15 DIAGNOSIS — W06XXXA Fall from bed, initial encounter: Secondary | ICD-10-CM | POA: Diagnosis not present

## 2022-10-15 DIAGNOSIS — E039 Hypothyroidism, unspecified: Secondary | ICD-10-CM | POA: Diagnosis not present

## 2022-10-15 DIAGNOSIS — Z7901 Long term (current) use of anticoagulants: Secondary | ICD-10-CM | POA: Diagnosis not present

## 2022-10-15 DIAGNOSIS — R930 Abnormal findings on diagnostic imaging of skull and head, not elsewhere classified: Secondary | ICD-10-CM | POA: Insufficient documentation

## 2022-10-15 DIAGNOSIS — I509 Heart failure, unspecified: Secondary | ICD-10-CM | POA: Diagnosis not present

## 2022-10-15 DIAGNOSIS — I110 Hypertensive heart disease with heart failure: Secondary | ICD-10-CM | POA: Insufficient documentation

## 2022-10-15 DIAGNOSIS — Z79899 Other long term (current) drug therapy: Secondary | ICD-10-CM | POA: Diagnosis not present

## 2022-10-15 DIAGNOSIS — W19XXXA Unspecified fall, initial encounter: Secondary | ICD-10-CM

## 2022-10-15 DIAGNOSIS — Z853 Personal history of malignant neoplasm of breast: Secondary | ICD-10-CM | POA: Insufficient documentation

## 2022-10-15 DIAGNOSIS — I4891 Unspecified atrial fibrillation: Secondary | ICD-10-CM | POA: Insufficient documentation

## 2022-10-15 DIAGNOSIS — S40022A Contusion of left upper arm, initial encounter: Secondary | ICD-10-CM | POA: Diagnosis not present

## 2022-10-15 DIAGNOSIS — S0990XA Unspecified injury of head, initial encounter: Secondary | ICD-10-CM | POA: Diagnosis not present

## 2022-10-15 DIAGNOSIS — Z743 Need for continuous supervision: Secondary | ICD-10-CM | POA: Diagnosis not present

## 2022-10-15 DIAGNOSIS — T1490XA Injury, unspecified, initial encounter: Secondary | ICD-10-CM | POA: Diagnosis not present

## 2022-10-15 DIAGNOSIS — R6889 Other general symptoms and signs: Secondary | ICD-10-CM | POA: Diagnosis not present

## 2022-10-15 DIAGNOSIS — M542 Cervicalgia: Secondary | ICD-10-CM | POA: Diagnosis not present

## 2022-10-15 DIAGNOSIS — M25512 Pain in left shoulder: Secondary | ICD-10-CM | POA: Diagnosis not present

## 2022-10-15 DIAGNOSIS — S4992XA Unspecified injury of left shoulder and upper arm, initial encounter: Secondary | ICD-10-CM | POA: Diagnosis present

## 2022-10-15 DIAGNOSIS — Z87891 Personal history of nicotine dependence: Secondary | ICD-10-CM | POA: Insufficient documentation

## 2022-10-15 DIAGNOSIS — R001 Bradycardia, unspecified: Secondary | ICD-10-CM | POA: Diagnosis not present

## 2022-10-15 LAB — CBC
HCT: 38.2 % (ref 36.0–46.0)
Hemoglobin: 12.2 g/dL (ref 12.0–15.0)
MCH: 33.2 pg (ref 26.0–34.0)
MCHC: 31.9 g/dL (ref 30.0–36.0)
MCV: 104.1 fL — ABNORMAL HIGH (ref 80.0–100.0)
Platelets: 118 10*3/uL — ABNORMAL LOW (ref 150–400)
RBC: 3.67 MIL/uL — ABNORMAL LOW (ref 3.87–5.11)
RDW: 15.3 % (ref 11.5–15.5)
WBC: 4.9 10*3/uL (ref 4.0–10.5)
nRBC: 0 % (ref 0.0–0.2)

## 2022-10-15 LAB — I-STAT CHEM 8, ED
BUN: 14 mg/dL (ref 8–23)
Calcium, Ion: 1.22 mmol/L (ref 1.15–1.40)
Chloride: 111 mmol/L (ref 98–111)
Creatinine, Ser: 0.9 mg/dL (ref 0.44–1.00)
Glucose, Bld: 104 mg/dL — ABNORMAL HIGH (ref 70–99)
HCT: 39 % (ref 36.0–46.0)
Hemoglobin: 13.3 g/dL (ref 12.0–15.0)
Potassium: 3.9 mmol/L (ref 3.5–5.1)
Sodium: 144 mmol/L (ref 135–145)
TCO2: 25 mmol/L (ref 22–32)

## 2022-10-15 LAB — COMPREHENSIVE METABOLIC PANEL
ALT: 16 U/L (ref 0–44)
AST: 23 U/L (ref 15–41)
Albumin: 3.5 g/dL (ref 3.5–5.0)
Alkaline Phosphatase: 60 U/L (ref 38–126)
Anion gap: 11 (ref 5–15)
BUN: 13 mg/dL (ref 8–23)
CO2: 22 mmol/L (ref 22–32)
Calcium: 8.8 mg/dL — ABNORMAL LOW (ref 8.9–10.3)
Chloride: 110 mmol/L (ref 98–111)
Creatinine, Ser: 0.91 mg/dL (ref 0.44–1.00)
GFR, Estimated: >60 mL/min (ref >60)
Glucose, Bld: 110 mg/dL — ABNORMAL HIGH (ref 70–99)
Potassium: 3.6 mmol/L (ref 3.5–5.1)
Sodium: 143 mmol/L (ref 135–145)
Total Bilirubin: 1.3 mg/dL — ABNORMAL HIGH (ref 0.3–1.2)
Total Protein: 5.9 g/dL — ABNORMAL LOW (ref 6.5–8.1)

## 2022-10-15 LAB — SAMPLE TO BLOOD BANK

## 2022-10-15 LAB — PROTIME-INR
INR: 1.5 — ABNORMAL HIGH (ref 0.8–1.2)
Prothrombin Time: 18.3 seconds — ABNORMAL HIGH (ref 11.4–15.2)

## 2022-10-15 LAB — LACTIC ACID, PLASMA: Lactic Acid, Venous: 1.2 mmol/L (ref 0.5–1.9)

## 2022-10-15 LAB — ETHANOL: Alcohol, Ethyl (B): <10 mg/dL (ref <10)

## 2022-10-15 MED ORDER — ACETAMINOPHEN 325 MG PO TABS
650.0000 mg | ORAL_TABLET | Freq: Once | ORAL | Status: AC
  Administered 2022-10-15: 650 mg via ORAL
  Filled 2022-10-15: qty 2

## 2022-10-15 NOTE — Progress Notes (Signed)
Orthopedic Tech Progress Note Patient Details:  [name] 1936-04-29 [id number]  Patient ID: [name], female   DOB: 10-06-1935, 87 y.o.   MRN: [id number] Level 2 trauma. Al Decant 10/15/2022, 10:39 PM

## 2022-10-15 NOTE — ED Provider Notes (Signed)
[place] EMERGENCY DEPARTMENT AT [facility] Provider Note   CSN: [id number] Arrival date & time: 10/15/22  2215     History  Chief Complaint  Patient presents with   [name] is a 87 y.o. female.   Fall   87 year old female presents emergency department after a fall.  Patient states that she was in bed and was either doing a puzzle and slipped off the side of the bed when she was drowsy or was asleep and rolled over falling off the side of the bed.  Incident occurred 1 to 2 hours prior to arrival where she was found by her daughter who lived in the same house before EMS arrived.  Patient is on Xarelto secondary to history of atrial fibrillation.  Reports hitting the left side of her face on the ground with no loss of consciousness.  Currently endorsing no visual disturbance or baseline, slurred speech, facial droop, weakness/sensory deficits in upper lower extremities from baseline, gait abnormality.  Patient additionally complaining of left-sided neck pain, left shoulder pain, thoracic back pain.  Denies chest pain, shortness of breath, abdominal pain, nausea, vomiting.  Past medical history significant for hypertension, hyperlipidemia, atrial fibrillation on Eliquis, breast cancer, CAD, hypothyroidism, obesity, CHF, OSA, ILD, hepatic cirrhosis, bronchiectasis  Home Medications Prior to Admission medications   Medication Sig Start Date End Date Taking? Authorizing Provider  traMADol (ULTRAM) 50 MG tablet Take 0.5-1 tablets (25-50 mg total) by mouth every 6 (six) hours as needed. 10/16/22  Yes Glyn Ade, MD  albuterol (PROVENTIL) (2.5 MG/3ML) 0.083% nebulizer solution Take 3 mLs (2.5 mg total) by nebulization every 6 (six) hours as needed for wheezing or shortness of breath. 10/12/21   Cobb, Ruby Cola, NP  allopurinol (ZYLOPRIM) 300 MG tablet Take 300 mg by mouth daily.    [provider]  ALPRAZolam Prudy Feeler) 0.5 MG tablet TAKE 1 TO 2 TABLETS BY  MOUTH TWICE DAILY AS NEEDED FOR ANXIETY OR SLEEP 06/07/22   [name], MD  AMBULATORY NON FORMULARY MEDICATION Medication Name: Prednisone Injection in knee    [provider]  azelastine (ASTELIN) 0.1 % nasal spray Place 2 sprays into both nostrils daily. Use in each nostril as directed Patient taking differently: Place 2 sprays into both nostrils in the morning. 07/14/19   [name], MD  budesonide-formoterol (SYMBICORT) 80-4.5 MCG/ACT inhaler 2 puffs Inhalation Twice a day    [provider]  co-enzyme Q-10 50 MG capsule Take 100 mg by mouth daily.    [provider]  colchicine 0.6 MG tablet Take 0.6 mg by mouth daily as needed (flare  up).    [provider]  cycloSPORINE (RESTASIS) 0.05 % ophthalmic emulsion Apply 1 drop to eye 2 (two) times daily as needed (allergies).    [provider]  doxycycline (VIBRAMYCIN) 100 MG capsule Take 1 capsule (100 mg total) by mouth 2 (two) times daily. 06/15/22   Allwardt, Crist Infante, PA-C  EPINEPHrine 0.3 mg/0.3 mL IJ SOAJ injection Inject 0.3 mg into the muscle as needed for anaphylaxis. 12/29/19   [provider]  Evolocumab with Infusor (REPATHA PUSHTRONEX SYSTEM) 420 MG/3.5ML SOCT INJECT 1 UNIT INTO THE SKIN EVERY 30 DAYS 07/05/22   Corky Crafts, MD  ezetimibe (ZETIA) 10 MG tablet Take 1 tablet (10 mg total) by mouth daily. Take on Tues & Fri 07/24/22   [name], MD  fexofenadine (ALLERGY RELIEF) 180 MG tablet Take 1 tablet (  180 mg total) by mouth daily. 02/12/22   Leslye Peer, MD  fluticasone (FLONASE) 50 MCG/ACT nasal spray Place 1 spray into both nostrils every morning.    [provider]  fluticasone-salmeterol (ADVAIR DISKUS) 500-50 MCG/ACT AEPB 1 puff Inhalation Twice a day    [provider]  fluticasone-salmeterol (ADVAIR) 500-50 MCG/ACT AEPB Inhale 1 puff into the lungs in the morning and at bedtime. Patient not taking: Reported on 10/02/2022    [provider]  gabapentin (NEURONTIN) 100 MG capsule Take by mouth. 06/27/15   [provider]  Homeopathic Products (ARNICARE) GEL Apply 1 application  topically daily as needed (soreness).    [provider]  hydrochlorothiazide (HYDRODIURIL) 25 MG tablet TAKE 2 TABLETS(50 MG) BY MOUTH DAILY 09/04/22   [name], MD  levalbuterol [facility] HFA) 45 MCG/ACT inhaler Inhale 2 puffs into the lungs every 6 (six) hours as needed for wheezing or shortness of breath. 09/30/19   [name], MD  levocetirizine (XYZAL) 5 MG tablet Take 1 tablet (5 mg total) by mouth every evening. 06/24/19   [name], MD  levothyroxine (SYNTHROID) 75 MCG tablet Take 1 tablet (75 mcg total) by mouth daily before breakfast. 06/10/22   [name], MD  metoprolol succinate (TOPROL-XL) 25 MG 24 hr tablet TAKE 1/2 TABLET BY MOUTH EVERY DAY 10/04/22   Corky Crafts, MD  mirtazapine (REMERON) 15 MG tablet TAKE 1 TABLET(15 MG) BY MOUTH AT BEDTIME Please call in September 2024 to make an November 2024 appointment for more refills 07/25/22   Doree Albee, PA-C  montelukast (SINGULAIR) 10 MG tablet Take 1 tablet (10 mg total) by mouth at bedtime. 07/01/21   Pokhrel, Rebekah Chesterfield, MD  ondansetron (ZOFRAN) 4 MG tablet Take 1 tablet (4 mg total) by mouth every 8 (eight) hours as needed for nausea or vomiting. 06/07/22   [name], MD  pantoprazole (PROTONIX) 40 MG tablet Take 1 tablet (40 mg total) by mouth daily. 12/26/21   Lynann Bologna, MD  potassium chloride SA (KLOR-CON M) 20 MEQ tablet TAKE 1 TABLET BY MOUTH DAILY 07/08/22   [name], MD  rosuvastatin (CRESTOR) 10 MG tablet 1 tablet Orally Twice a week    [provider]  XARELTO 20 MG TABS tablet TAKE 1 TABLET(20 MG) BY MOUTH DAILY WITH SUPPER 03/12/22   Corky Crafts, MD      Allergies    Crestor [rosuvastatin calcium], Hydrocodone-acetaminophen, Lidocaine hcl, Lipitor [atorvastatin calcium], Procaine, Rosuvastatin, Theophylline,  Vicodin [hydrocodone-acetaminophen], Codeine, Ephedrine, Other, Oxycodone, Oxycodone-acetaminophen, Propoxyphene, Quinine derivatives, Sulfa antibiotics, Epinephrine, Penicillins, Quinine, and Theophyllines    Review of Systems   Review of Systems  All other systems reviewed and are negative.   Physical Exam Updated Vital Signs BP 137/73   Pulse 76   Temp 98 F (36.7 C) (Oral)   Resp 13   Ht 5\' 6"  (1.676 m)   Wt 102.1 kg   SpO2 99%   BMI 36.32 kg/m  Physical Exam Vitals and nursing note reviewed.  Constitutional:      General: She is not in acute distress.    Appearance: She is well-developed.  HENT:     Head: Normocephalic.     Right Ear: Tympanic membrane normal.     Left Ear: Tympanic membrane normal.     Nose: Nose normal.     Mouth/Throat:     Mouth: Mucous membranes are moist.     Pharynx: Oropharynx is clear.  Eyes:     Extraocular Movements: Extraocular movements intact.     Conjunctiva/sclera: Conjunctivae normal.     Pupils: Pupils are equal, round, and reactive to light.  Cardiovascular:     Rate and Rhythm: Normal rate and regular rhythm.     Heart sounds: No murmur heard. Pulmonary:     Effort: Pulmonary effort is normal. No respiratory distress.     Breath sounds: Normal breath sounds.  Abdominal:     Palpations: Abdomen is soft.     Tenderness: There is no abdominal tenderness.  Musculoskeletal:        General: No swelling.     Cervical back: Neck supple.     Comments: No midline tenderness of cervical spine with paraspinal tenderness in the left cervical region.  Midline tenderness of lower thoracic spine with no midline tenderness of lumbar spine.  Mild left-sided chest wall tenderness to palpation.  Patient with left shoulder tenderness with pain with range of motion of left shoulder but otherwise, no tenderness to palpation of upper or lower extremities.    Skin:    General: Skin is warm and dry.     Capillary Refill: Capillary refill takes less  than 2 seconds.     Comments: Ecchymosis appreciated on left lateral aspect of shoulder.  Neurological:     Mental Status: She is alert.     Comments: Alert and oriented to self, place, time and event.   Speech is fluent, clear without dysarthria or dysphasia.   Strength symmetric in upper/lower extremities   Sensation intact in upper/lower extremities   Patient moves all 4 extremities without difficulty. CN I not tested  CN II not tested CN III, IV, VI PERRLA and EOMs intact bilaterally  CN V Intact sensation to sharp and light touch to the face  CN VII facial movements symmetric  CN VIII not tested  CN IX, X no uvula deviation, symmetric rise of soft palate  CN XI symmetric SCM and trapezius strength bilaterally  CN XII Midline tongue protrusion, symmetric L/R movements     Psychiatric:        Mood and Affect: Mood normal.     ED Results / Procedures / Treatments   Labs (all labs ordered are listed, but only abnormal results are displayed) Labs Reviewed  COMPREHENSIVE METABOLIC PANEL - Abnormal; Notable for the following components:      Result Value   Glucose, Bld 110 (*)    Calcium 8.8 (*)    Total Protein 5.9 (*)    Total Bilirubin 1.3 (*)    All other components within normal limits  CBC - Abnormal; Notable for the following components:   RBC 3.67 (*)    MCV 104.1 (*)    Platelets 118 (*)    All other components within normal limits  PROTIME-INR - Abnormal; Notable for the following components:   Prothrombin Time 18.3 (*)    INR 1.5 (*)    All other components within normal limits  I-STAT CHEM 8, ED - Abnormal; Notable for the following components:   Glucose, Bld 104 (*)    All other components within normal limits  ETHANOL  LACTIC ACID, PLASMA  URINALYSIS, ROUTINE W REFLEX MICROSCOPIC  SAMPLE TO BLOOD BANK    EKG None  Radiology DG Shoulder Left  Result Date: 10/15/2022 CLINICAL DATA:  Pain, fall, on blood thinners EXAM: LEFT SHOULDER - 2+ VIEW  COMPARISON:  None Available. FINDINGS: Advanced degenerative changes in the left [facility] and glenohumeral  joints with joint space narrowing and spurring. Subacromial spurring noted. No acute bony abnormality. Specifically, no fracture, subluxation, or dislocation. IMPRESSION: Advanced degenerative changes.  No acute bony abnormality. Electronically Signed   By: Charlett Nose M.D.   On: 10/15/2022 23:31   CT T-SPINE NO CHARGE  Result Date: 10/15/2022 CLINICAL DATA:  Trauma EXAM: CT THORACIC SPINE WITHOUT CONTRAST TECHNIQUE: Multidetector CT images of the thoracic were obtained using the standard protocol without intravenous contrast. RADIATION DOSE REDUCTION: This exam was performed according to the departmental dose-optimization program which includes automated exposure control, adjustment of the mA and/or kV according to patient size and/or use of iterative reconstruction technique. COMPARISON:  Chest CT today FINDINGS: Alignment: Normal alignment. Vertebrae: No acute fracture or focal pathologic process. Paraspinal and other soft tissues: Enlargement of the left neck muscles, likely levator scapulae muscle with surrounding stranding compatible with intramuscular hematoma. Disc levels: Disc space narrowing with anterolateral spurring. 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 IMPRESSION: No acute bony abnormality. Enlargement of the left neck levator scapulae muscle with surrounding stranding compatible with intramuscular hematoma. Electronically Signed   By: Charlett Nose M.D.   On: 10/15/2022 23:31   CT Chest Wo Contrast  Result Date: 10/15/2022 CLINICAL DATA:  trauma EXAM: CT CHEST WITHOUT CONTRAST TECHNIQUE: Multidetector CT imaging of the chest was performed following the standard protocol without IV contrast. RADIATION DOSE REDUCTION: This exam was performed according to the departmental dose-optimization program which includes automated exposure control, adjustment of the mA and/or kV according to patient size  and/or use of iterative reconstruction technique. COMPARISON:  None Available. FINDINGS: Cardiovascular: Heart is borderline in size. Diffuse coronary artery and moderate aortic calcifications. No aneurysm. Mediastinum/Nodes: Scattered small mediastinal lymph nodes, none pathologically enlarged. No hilar or axillary adenopathy. Trachea and esophagus are unremarkable. Thyroid unremarkable. Lungs/Pleura: Linear areas of scarring in the lungs bilaterally. No effusions, confluent opacities or pneumothorax. Upper Abdomen: No acute findings. Left nephrolithiasis with largest visualized stone measuring 9 mm. Bilateral renal cysts appear benign. No follow-up imaging recommended. Aortic atherosclerosis. Musculoskeletal: There is enlargement of the muscles within the left lateral neck, likely levator scapulae muscle with surrounding stranding concerning for intramuscular hematoma. No acute bony abnormality. Advanced degenerative changes in the shoulders bilaterally. IMPRESSION: Enlargement of the left levator scapulae muscle with surrounding stranding most compatible with intramuscular hematoma. No acute cardiopulmonary disease. Areas of scarring in the lungs bilaterally. Diffuse coronary artery disease. Aortic Atherosclerosis (ICD10-I70.0). Electronically Signed   By: Charlett Nose M.D.   On: 10/15/2022 23:29   CT CERVICAL SPINE WO CONTRAST  Result Date: 10/15/2022 CLINICAL DATA:  Polytrauma, blunt EXAM: CT CERVICAL SPINE WITHOUT CONTRAST TECHNIQUE: Multidetector CT imaging of the cervical spine was performed without intravenous contrast. Multiplanar CT image reconstructions were also generated. RADIATION DOSE REDUCTION: This exam was performed according to the departmental dose-optimization program which includes automated exposure control, adjustment of the mA and/or kV according to patient size and/or use of iterative reconstruction technique. COMPARISON:  Plain films 06/10/2017 FINDINGS: Alignment: No subluxation  Skull base and vertebrae: No acute fracture. No primary bone lesion or focal pathologic process. Soft tissues and spinal canal: No prevertebral fluid or swelling. No visible canal hematoma. Disc levels: Fusion across the C6-7 disc space could be degenerative or congenital. Diffuse degenerative disc and facet disease. No visible disc herniation. Upper chest: No acute findings Other: There is enlargement of the left neck muscles with surrounding stranding concerning for intramuscular hematoma. This appears to be involving the levator scapulae muscle.  IMPRESSION: No acute bony abnormality. Degenerative disc and facet disease throughout the cervical spine. Enlargement of the left levator scapulae muscle with surrounding stranding most compatible with intramuscular hematoma. Electronically Signed   By: Charlett Nose M.D.   On: 10/15/2022 23:23   CT HEAD WO CONTRAST  Result Date: 10/15/2022 CLINICAL DATA:  Head trauma, moderate-severe EXAM: CT HEAD WITHOUT CONTRAST TECHNIQUE: Contiguous axial images were obtained from the base of the skull through the vertex without intravenous contrast. RADIATION DOSE REDUCTION: This exam was performed according to the departmental dose-optimization program which includes automated exposure control, adjustment of the mA and/or kV according to patient size and/or use of iterative reconstruction technique. COMPARISON:  08/12/2020 FINDINGS: Brain: There is atrophy and chronic small vessel disease changes. No acute intracranial abnormality. Specifically, no hemorrhage, hydrocephalus, mass lesion, acute infarction, or significant intracranial injury. Vascular: No hyperdense vessel or unexpected calcification. Skull: No acute calvarial abnormality. Sinuses/Orbits: No acute findings Other: None IMPRESSION: Atrophy, chronic microvascular disease. No acute intracranial abnormality. Electronically Signed   By: Charlett Nose M.D.   On: 10/15/2022 23:13    Procedures Procedures     Medications Ordered in ED Medications  acetaminophen (TYLENOL) tablet 650 mg (650 mg Oral Given 10/15/22 2301)  traMADol (ULTRAM) tablet 50 mg (50 mg Oral Given 10/16/22 0020)    ED Course/ Medical Decision Making/ A&P Clinical Course as of 10/16/22 0311  Tue Oct 15, 2022  2243 Consulted attending physician Dr. Doran Durand who independently evaluated the patient and wants agreement with treatment plan going forward. [CR]    Clinical Course User Index [CR] Peter Garter, PA                             Medical Decision Making Amount and/or Complexity of Data Reviewed Labs: ordered. Radiology: ordered.  Risk OTC drugs. Prescription drug management.   This patient presents to the ED for concern of fall, this involves an extensive number of treatment options, and is a complaint that carries with it a high risk of complications and morbidity.  The differential diagnosis includes CVA, fracture, strain chest pain, dislocation, spinal cord injury, pneumothorax, solid organ damage   Co morbidities that complicate the patient evaluation  See HPI   Additional history obtained:  Additional history obtained from EMR External records from outside source obtained and reviewed including hospital records   Lab Tests:  I Ordered, and personally interpreted labs.  The pertinent results include: No leukocytosis noted.  No evidence anemia.  Thrombocytopenia of 118 oh which is near patient's baseline per laboratory studies with performed in the past.  Patient with mild hypocalcemia of 8.8 but otherwise, without electrolyte abnormalities.  No transaminitis.  No renal dysfunction.  Lactic acid within normal limits.  Ethanol within normal limits.  PT/INR significant for 18.3 and 1.5 respectively.   Imaging Studies ordered:  I ordered imaging studies including CT head, CT C-spine, CT chest, CT T-spine, left shoulder x-ray I independently visualized and interpreted imaging which showed   CT head/C-spine: No acute fracture or traumatic subluxation of cervical spine.  Degenerative disc and facet disease throughout cervical spine.  Enlargement of left levator scapulae muscle with surrounding stranding.  Atrophy, chronic microvascular changes.  No acute intracranial abnormality CT T-spine: No acute fracture or subluxation of T-spine CT chest: No acute cardiopulmonary disease.  Areas of scarring of lungs bilaterally.  Diffuse CAD.  Aortic atherosclerosis. Left shoulder x-ray: No acute osseous abnormality.  Advanced degenerative changes I agree with the radiologist interpretation  Cardiac Monitoring: / EKG:  The patient was maintained on a cardiac monitor.  I personally viewed and interpreted the cardiac monitored which showed an underlying rhythm of: Atrial fibrillation   Consultations Obtained:  See ED course  Problem List / ED Course / Critical interventions / Medication management  Fall, left levator scapula hematoma I ordered medication including tramadol, Tylenol   Reevaluation of the patient after these medicines showed that the patient improved I have reviewed the patients home medicines and have made adjustments as needed   Social Determinants of Health:  Former cigarette use.  Denies illicit drug use.   Test / Admission - Considered:  Fall, left levator scapula hematoma Vitals signs within normal range and stable throughout visit. Laboratory/imaging studies significant for: See above 87 year old female presents emergency department after a fall.  Patient's workup today overall reassuring with evidence of left levator scapula hematoma but otherwise without significant acute traumatic injury from incident occurred earlier today.  Fall described as rolling off bed.  Laboratory studies obtained without any acute abnormality from patient's baseline.  Patient reassured by overall workup.  To recommend outpatient follow-up with primary care for reevaluation as well as  treatment of pain with Tylenol/breakthrough pain with tramadol.  Treatment plan discussed at length with patient and she acknowledged understanding was agreeable to said plan.  Patient overall well-appearing, afebrile in no acute distress, without acute neurologic deficit.. Worrisome signs and symptoms were discussed with the patient, and the patient acknowledged understanding to return to the ED if noticed. Patient was stable upon discharge.          Final Clinical Impression(s) / ED Diagnoses Final diagnoses:  Fall, initial encounter  Hematoma    Rx / DC Orders ED Discharge Orders          Ordered    traMADol (ULTRAM) 50 MG tablet  Every 6 hours PRN        10/16/22 0030              Peter Garter, PA 10/16/22 1610    Glyn Ade, MD 10/16/22 1450

## 2022-10-15 NOTE — ED Notes (Signed)
Patient to CT.
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
## 2022-10-15 NOTE — ED Provider Notes (Incomplete)
[place] EMERGENCY DEPARTMENT AT [facility] Provider Note   CSN: [id number] Arrival date & time: 10/15/22  2215     History {Add pertinent medical, surgical, social history, OB history to HPI:1} Chief Complaint  Patient presents with  . Fall    [name] is a 87 y.o. female.   Fall   87 year old female presents emergency department after a fall.  Patient states that she was in bed and was either doing a puzzle and slipped off the side of the bed when she was drowsy or was asleep and rolled over falling off the side of the bed.  Incident occurred 1 to 2 hours prior to arrival where she was found by her daughter who lived in the same house before EMS arrived.  Patient is on Xarelto secondary to history of atrial fibrillation.  Reports hitting the left side of her face on the ground with no loss of consciousness.  Currently endorsing no visual disturbance or baseline, slurred speech, facial droop, weakness/sensory deficits in upper lower extremities from baseline, gait abnormality.  Patient additionally complaining of left-sided neck pain, left shoulder pain, thoracic back pain.  Denies chest pain, shortness of breath, abdominal pain, nausea, vomiting.  Past medical history significant for hypertension, hyperlipidemia, atrial fibrillation on Eliquis, breast cancer, CAD, hypothyroidism, obesity, CHF, OSA, ILD, hepatic cirrhosis, bronchiectasis  Home Medications Prior to Admission medications   Medication Sig Start Date End Date Taking? Authorizing Provider  albuterol (PROVENTIL) (2.5 MG/3ML) 0.083% nebulizer solution Take 3 mLs (2.5 mg total) by nebulization every 6 (six) hours as needed for wheezing or shortness of breath. 10/12/21   Cobb, Ruby Cola, NP  allopurinol (ZYLOPRIM) 300 MG tablet Take 300 mg by mouth daily.    [provider]  ALPRAZolam Prudy Feeler) 0.5 MG tablet TAKE 1 TO 2 TABLETS BY MOUTH TWICE DAILY AS NEEDED FOR ANXIETY OR SLEEP 06/07/22   [name], MD   AMBULATORY NON FORMULARY MEDICATION Medication Name: Prednisone Injection in knee    [provider]  azelastine (ASTELIN) 0.1 % nasal spray Place 2 sprays into both nostrils daily. Use in each nostril as directed Patient taking differently: Place 2 sprays into both nostrils in the morning. 07/14/19   [name], MD  budesonide-formoterol (SYMBICORT) 80-4.5 MCG/ACT inhaler 2 puffs Inhalation Twice a day    [provider]  co-enzyme Q-10 50 MG capsule Take 100 mg by mouth daily.    [provider]  colchicine 0.6 MG tablet Take 0.6 mg by mouth daily as needed (flare  up).    [provider]  cycloSPORINE (RESTASIS) 0.05 % ophthalmic emulsion Apply 1 drop to eye 2 (two) times daily as needed (allergies).    [provider]  doxycycline (VIBRAMYCIN) 100 MG capsule Take 1 capsule (100 mg total) by mouth 2 (two) times daily. 06/15/22   Allwardt, Crist Infante, PA-C  EPINEPHrine 0.3 mg/0.3 mL IJ SOAJ injection Inject 0.3 mg into the muscle as needed for anaphylaxis. 12/29/19   [provider]  Evolocumab with Infusor (REPATHA PUSHTRONEX SYSTEM) 420 MG/3.5ML SOCT INJECT 1 UNIT INTO THE SKIN EVERY 30 DAYS 07/05/22   Corky Crafts, MD  ezetimibe (ZETIA) 10 MG tablet Take 1 tablet (10 mg total) by mouth daily. Take on Tues & Fri 07/24/22   [name], MD  fexofenadine (ALLERGY RELIEF) 180 MG tablet Take 1 tablet (180 mg total) by mouth daily. 02/12/22   Leslye Peer, MD  fluticasone (FLONASE) 50  MCG/ACT nasal spray Place 1 spray into both nostrils every morning.    [provider]  fluticasone-salmeterol (ADVAIR DISKUS) 500-50 MCG/ACT AEPB 1 puff Inhalation Twice a day    [provider]  fluticasone-salmeterol (ADVAIR) 500-50 MCG/ACT AEPB Inhale 1 puff into the lungs in the morning and at bedtime. Patient not taking: Reported on 10/02/2022    [provider]  gabapentin (NEURONTIN) 100 MG capsule Take by mouth. 06/27/15    [provider]  Homeopathic Products (ARNICARE) GEL Apply 1 application  topically daily as needed (soreness).    [provider]  hydrochlorothiazide (HYDRODIURIL) 25 MG tablet TAKE 2 TABLETS(50 MG) BY MOUTH DAILY 09/04/22   [name], MD  levalbuterol [facility] HFA) 45 MCG/ACT inhaler Inhale 2 puffs into the lungs every 6 (six) hours as needed for wheezing or shortness of breath. 09/30/19   [name], MD  levocetirizine (XYZAL) 5 MG tablet Take 1 tablet (5 mg total) by mouth every evening. 06/24/19   [name], MD  levothyroxine (SYNTHROID) 75 MCG tablet Take 1 tablet (75 mcg total) by mouth daily before breakfast. 06/10/22   [name], MD  metoprolol succinate (TOPROL-XL) 25 MG 24 hr tablet TAKE 1/2 TABLET BY MOUTH EVERY DAY 10/04/22   Corky Crafts, MD  mirtazapine (REMERON) 15 MG tablet TAKE 1 TABLET(15 MG) BY MOUTH AT BEDTIME Please call in September 2024 to make an November 2024 appointment for more refills 07/25/22   Doree Albee, PA-C  montelukast (SINGULAIR) 10 MG tablet Take 1 tablet (10 mg total) by mouth at bedtime. 07/01/21   Pokhrel, Rebekah Chesterfield, MD  ondansetron (ZOFRAN) 4 MG tablet Take 1 tablet (4 mg total) by mouth every 8 (eight) hours as needed for nausea or vomiting. 06/07/22   [name], MD  pantoprazole (PROTONIX) 40 MG tablet Take 1 tablet (40 mg total) by mouth daily. 12/26/21   Lynann Bologna, MD  potassium chloride SA (KLOR-CON M) 20 MEQ tablet TAKE 1 TABLET BY MOUTH DAILY 07/08/22   [name], MD  rosuvastatin (CRESTOR) 10 MG tablet 1 tablet Orally Twice a week    [provider]  traMADol (ULTRAM) 50 MG tablet Take by mouth. 10/29/12   [provider]  XARELTO 20 MG TABS tablet TAKE 1 TABLET(20 MG) BY MOUTH DAILY WITH SUPPER 03/12/22   Corky Crafts, MD      Allergies    Crestor [rosuvastatin calcium], Hydrocodone-acetaminophen, Lidocaine hcl, Lipitor [atorvastatin calcium], Procaine, Rosuvastatin, Theophylline,  Vicodin [hydrocodone-acetaminophen], Codeine, Ephedrine, Other, Oxycodone, Oxycodone-acetaminophen, Propoxyphene, Quinine derivatives, Sulfa antibiotics, Epinephrine, Penicillins, Quinine, and Theophyllines    Review of Systems   Review of Systems  All other systems reviewed and are negative.   Physical Exam Updated Vital Signs BP (!) 148/85 (BP Location: Right Arm)   Pulse 73   Temp 98 F (36.7 C) (Oral)   Resp 20   Ht 5\' 6"  (1.676 m)   Wt 102.1 kg   SpO2 98%   BMI 36.32 kg/m  Physical Exam Vitals and nursing note reviewed.  Constitutional:      General: She is not in acute distress.    Appearance: She is well-developed.  HENT:     Head: Normocephalic.     Right Ear: Tympanic membrane normal.     Left Ear: Tympanic membrane normal.     Nose: Nose normal.     Mouth/Throat:     Mouth: Mucous membranes are moist.     Pharynx:  Oropharynx is clear.  Eyes:     Extraocular Movements: Extraocular movements intact.     Conjunctiva/sclera: Conjunctivae normal.     Pupils: Pupils are equal, round, and reactive to light.  Cardiovascular:     Rate and Rhythm: Normal rate and regular rhythm.     Heart sounds: No murmur heard. Pulmonary:     Effort: Pulmonary effort is normal. No respiratory distress.     Breath sounds: Normal breath sounds.  Abdominal:     Palpations: Abdomen is soft.     Tenderness: There is no abdominal tenderness.  Musculoskeletal:        General: No swelling.     Cervical back: Neck supple.     Comments: No midline tenderness of cervical spine with paraspinal tenderness in the left cervical region.  Midline tenderness of lower thoracic spine with no midline tenderness of lumbar spine.  Mild left-sided chest wall tenderness to palpation.  Patient with left shoulder tenderness with pain with range of motion of left shoulder but otherwise, no tenderness to palpation of upper or lower extremities.    Skin:    General: Skin is warm and dry.     Capillary Refill:  Capillary refill takes less than 2 seconds.     Comments: Ecchymosis appreciated on left lateral aspect of shoulder.  Neurological:     Mental Status: She is alert.     Comments: Alert and oriented to self, place, time and event.   Speech is fluent, clear without dysarthria or dysphasia.   Strength symmetric in upper/lower extremities   Sensation intact in upper/lower extremities   Patient moves all 4 extremities without difficulty. CN I not tested  CN II not tested CN III, IV, VI PERRLA and EOMs intact bilaterally  CN V Intact sensation to sharp and light touch to the face  CN VII facial movements symmetric  CN VIII not tested  CN IX, X no uvula deviation, symmetric rise of soft palate  CN XI symmetric SCM and trapezius strength bilaterally  CN XII Midline tongue protrusion, symmetric L/R movements     Psychiatric:        Mood and Affect: Mood normal.     ED Results / Procedures / Treatments   Labs (all labs ordered are listed, but only abnormal results are displayed) Labs Reviewed  COMPREHENSIVE METABOLIC PANEL  CBC  ETHANOL  URINALYSIS, ROUTINE W REFLEX MICROSCOPIC  LACTIC ACID, PLASMA  PROTIME-INR  I-STAT CHEM 8, ED  SAMPLE TO BLOOD BANK    EKG None  Radiology No results found.  Procedures Procedures  {Document cardiac monitor, telemetry assessment procedure when appropriate:1}  Medications Ordered in ED Medications  acetaminophen (TYLENOL) tablet 650 mg (has no administration in time range)    ED Course/ Medical Decision Making/ A&P Clinical Course as of 10/15/22 2302  Tue Oct 15, 2022  2243 Consulted attending physician Dr. Doran Durand who independently evaluated the patient and wants agreement with treatment plan going forward. [CR]    Clinical Course User Index [CR] Peter Garter, PA   {   Click here for ABCD2, HEART and other calculatorsREFRESH Note before signing :1}                          Medical Decision Making Amount and/or  Complexity of Data Reviewed Labs: ordered. Radiology: ordered.  Risk OTC drugs.   This patient presents to the ED for concern of fall, this involves an extensive number of  treatment options, and is a complaint that carries with it a high risk of complications and morbidity.  The differential diagnosis includes CVA, fracture, strain chest pain, dislocation, spinal cord injury, pneumothorax, solid organ damage   Co morbidities that complicate the patient evaluation  See HPI   Additional history obtained:  Additional history obtained from EMR External records from outside source obtained and reviewed including hospital records   Lab Tests:  I Ordered, and personally interpreted labs.  The pertinent results include:  ***   Imaging Studies ordered:  I ordered imaging studies including ***  I independently visualized and interpreted imaging which showed *** I agree with the radiologist interpretation   Cardiac Monitoring: / EKG:  The patient was maintained on a cardiac monitor.  I personally viewed and interpreted the cardiac monitored which showed an underlying rhythm of: Atrial fibrillation   Consultations Obtained:  I requested consultation with the ***,  and discussed lab and imaging findings as well as pertinent plan - they recommend: ***   Problem List / ED Course / Critical interventions / Medication management  *** I ordered medication including ***  for ***  Reevaluation of the patient after these medicines showed that the patient {resolved/improved/worsened:23923::"improved"} I have reviewed the patients home medicines and have made adjustments as needed   Social Determinants of Health:  ***   Test / Admission - Considered:  Vitals signs significant for ***. Otherwise within normal range and stable throughout visit. Laboratory/imaging studies significant for: *** *** Worrisome signs and symptoms were discussed with the patient, and the patient  acknowledged understanding to return to the ED if noticed. Patient was stable upon discharge.    {Document critical care time when appropriate:1} {Document review of labs and clinical decision tools ie heart score, Chads2Vasc2 etc:1}  {Document your independent review of radiology images, and any outside records:1} {Document your discussion with family members, caretakers, and with consultants:1} {Document social determinants of health affecting pt's care:1} {Document your decision making why or why not admission, treatments were needed:1} Final Clinical Impression(s) / ED Diagnoses Final diagnoses:  None    Rx / DC Orders ED Discharge Orders     None

## 2022-10-15 NOTE — Discharge Instructions (Addendum)
Note the workup today was overall reassuring.  As discussed, imaging studies were negative for any fracture, dislocation, or brain bleed or other emergent abnormality appreciated from trauma.  You do have hematoma or pocket of bruising noted more the muscles of the left side of your neck/back/shoulder which I think is causing majority of her symptoms.  Will recommend Tylenol at home as well as icing affected area for the next 24 to 48 hours and then switch to heat thereafter.  Will also send in tramadol to take as needed for breakthrough pain.  Recommend follow-up with primary care for reevaluation of your symptoms.  Please not hesitate to return to emergency department for worrisome signs and symptoms we discussed become apparent.

## 2022-10-15 NOTE — ED Notes (Signed)
C-collar removed by provider

## 2022-10-15 NOTE — ED Triage Notes (Signed)
Patient BIB EMS from home due to fall. Patient takes blood thinner. Patient was laying in bed and fell about 1-2 hours ago. Denies LOC. Fell from hit left side of face. Patient endorses cervical neck pain. L arm tenderness. No vision changes. No N/V/D. EMS reports patient able to walk and use bathroom with them. Patient A&Ox4.

## 2022-10-16 ENCOUNTER — Telehealth: Payer: Self-pay | Admitting: Internal Medicine

## 2022-10-16 MED ORDER — HYDROCHLOROTHIAZIDE 25 MG PO TABS: ORAL_TABLET | ORAL | 1 refills | Status: DC

## 2022-10-16 MED ORDER — TRAMADOL HCL 50 MG PO TABS
50.0000 mg | ORAL_TABLET | Freq: Once | ORAL | Status: AC
  Administered 2022-10-16: 50 mg via ORAL
  Filled 2022-10-16: qty 1

## 2022-10-16 MED ORDER — TRAMADOL HCL 50 MG PO TABS: 25.0000 mg | ORAL_TABLET | Freq: Four times a day (QID) | ORAL | 0 refills | Status: DC | PRN

## 2022-10-16 NOTE — Telephone Encounter (Signed)
Refill has been sent to [facility].Marland KitchenRaechel Chute

## 2022-10-16 NOTE — Telephone Encounter (Signed)
Prescription Request  10/16/2022  LOV: 07/24/2022  What is the name of the medication or equipment? hydrochlorithizide  Have you contacted your pharmacy to request a refill? Yes   Which pharmacy would you like this sent to?  [id number] - [place], [place] - [address] [facility] [address] [facility] [address] [place] [address] Phone: [phone] Fax: [phone]     Patient notified that their request is being sent to the clinical staff for review and that they should receive a response within 2 business days.   Please advise at Mobile [phone] (mobile)

## 2022-10-19 DIAGNOSIS — J9601 Acute respiratory failure with hypoxia: Secondary | ICD-10-CM | POA: Diagnosis not present

## 2022-10-19 DIAGNOSIS — J441 Chronic obstructive pulmonary disease with (acute) exacerbation: Secondary | ICD-10-CM | POA: Diagnosis not present

## 2022-10-23 ENCOUNTER — Telehealth: Payer: Self-pay

## 2022-10-23 NOTE — Telephone Encounter (Addendum)
Transition Care Management Follow-up Telephone Call Date of discharge and from where: 10/16/2022 [facility] How have you been since you were released from the hospital? Patient is still not feeling better, has pain in the back of her head. Any questions or concerns? No  Items Reviewed: Did the pt receive and understand the discharge instructions provided? Yes  Medications obtained and verified? Yes  Other?  Patient was very pleased with the care provided. Everyone was kind and caring. Any new allergies since your discharge? No  Dietary orders reviewed? Yes Do you have support at home? Yes   Follow up appointments reviewed:  PCP Hospital f/u appt confirmed? No  Scheduled to see  on  @ . Specialist Hospital f/u appt confirmed? Yes  Scheduled to see Lady Saucier, MD on 10/24/2022 @ [facility] at [facility]. Are transportation arrangements needed? No  If their condition worsens, is the pt aware to call PCP or go to the Emergency Dept.? Yes Was the patient provided with contact information for the PCP's office or ED? Yes Was to pt encouraged to call back with questions or concerns? Yes  Talor Desrosiers [facility]  [facility] Population Health Community Resource Care Guide   [email or web address]  ?? [phone]   Website: [email or web address]  [email or web address]

## 2022-10-24 ENCOUNTER — Ambulatory Visit: Payer: Medicare Other | Admitting: Family Medicine

## 2022-10-24 VITALS — BP 128/78 | HR 83 | Ht 66.0 in | Wt 234.0 lb

## 2022-10-24 DIAGNOSIS — S060X0D Concussion without loss of consciousness, subsequent encounter: Secondary | ICD-10-CM

## 2022-10-24 DIAGNOSIS — M542 Cervicalgia: Secondary | ICD-10-CM | POA: Diagnosis not present

## 2022-10-24 NOTE — Patient Instructions (Signed)
Thank you for coming in today.   I've referred you to Physical Therapy.  Let us know if you don't hear from them in one week.   Try using a heating pad  TENS UNIT: This is helpful for muscle pain and spasm.   Search and Purchase a TENS 7000 2nd edition at  www.tenspros.com or www.Amazon.com It should be less than $30.     TENS unit instructions: Do not shower or bathe with the unit on Turn the unit off before removing electrodes or batteries If the electrodes lose stickiness add a drop of water to the electrodes after they are disconnected from the unit and place on plastic sheet. If you continued to have difficulty, call the TENS unit company to purchase more electrodes. Do not apply lotion on the skin area prior to use. Make sure the skin is clean and dry as this will help prolong the life of the electrodes. After use, always check skin for unusual red areas, rash or other skin difficulties. If there are any skin problems, does not apply electrodes to the same area. Never remove the electrodes from the unit by pulling the wires. Do not use the TENS unit or electrodes other than as directed. Do not change electrode placement without consultating your therapist or physician. Keep 2 fingers with between each electrode. Wear time ratio is 2:1, on to off times.    For example on for 30 minutes off for 15 minutes and then on for 30 minutes off for 15 minutes

## 2022-10-24 NOTE — Progress Notes (Signed)
Subjective:   I, [name], PhD, LAT, ATC acting as a scribe for [name], MD.  Chief Complaint: [name],  is a 87 y.o. female who presents for initial evaluation of a head injury and multiple contusions. MOI: She fell/rolled out of bed due to her nightgown being tangled in the bedding. She was transported via EMS to [facility] ED and dx w/ a levator scapula hematoma. Today, pt c/o cont'd confusion, soreness in her neck, shoulders, and thoracic spine. Her daughter notes a "bump" on the back of pt's head.   Dx imaging: 6//1/24 see imaging tab  Injury date: 10/15/22 Visit #: 1  History of Present Illness:   Concussion Self-Reported Symptom Score Symptoms rated on a scale 1-6, in last 24 hours  Headache: 3   Pressure in head: 3 Neck pain: 6 Nausea or vomiting: 0 Dizziness: 0  Blurred vision: 0  Balance problems: 2 Sensitivity to light:  0 Sensitivity to noise: 0 Feeling slowed down: 6 Feeling like "in a fog": 6 "Don't feel right": 6 Difficulty concentrating: 3 Difficulty remembering: 3 Fatigue or low energy: 6 Confusion: 6 Drowsiness: 3 More emotional: 0 Irritability: 0 Sadness: 0 Nervous or anxious: 0 Trouble falling asleep: 0   Total # of Symptoms: 12/22 Total Symptom Score: 53/132  Tinnitus: pre-existing  Review of Systems: No fevers or chills    Review of History: Heart failure and interstitial lung disease.  Osteoporosis.  Objective:    Physical Examination Vitals:   10/24/22 1328  BP: 128/78  Pulse: 83  SpO2: 96%   MSK: C-spine: Normal appearing. Nontender palpation spinal midline. Tender palpation left cervical paraspinal musculature. Neuro: Alert and oriented normal coordination.  Patient uses a walker for ambulation. Psych: Normal speech thought process and affect.     Imaging:  EXAM: CT CERVICAL SPINE WITHOUT CONTRAST   TECHNIQUE: Multidetector CT imaging of the cervical spine was performed without intravenous contrast.  Multiplanar CT image reconstructions were also generated.   RADIATION DOSE REDUCTION: This exam was performed according to the departmental dose-optimization program which includes automated exposure control, adjustment of the mA and/or kV according to patient size and/or use of iterative reconstruction technique.   COMPARISON:  Plain films 06/10/2017   FINDINGS: Alignment: No subluxation   Skull base and vertebrae: No acute fracture. No primary bone lesion or focal pathologic process.   Soft tissues and spinal canal: No prevertebral fluid or swelling. No visible canal hematoma.   Disc levels: Fusion across the C6-7 disc space could be degenerative or congenital. Diffuse degenerative disc and facet disease. No visible disc herniation.   Upper chest: No acute findings   Other: There is enlargement of the left neck muscles with surrounding stranding concerning for intramuscular hematoma. This appears to be involving the levator scapulae muscle.   IMPRESSION: No acute bony abnormality. Degenerative disc and facet disease throughout the cervical spine.   Enlargement of the left levator scapulae muscle with surrounding stranding most compatible with intramuscular hematoma.     Electronically Signed   By: Charlett Nose M.D.   On: 10/15/2022 23:23 I, [name], personally (independently) visualized and performed the interpretation of the images attached in this note.   Assessment and Plan   87 y.o. female with fall resulting in neck injury and concussion.  CT scan did show a hematoma of the levator scapula muscle and she does have pain in this area.  I think she suffered a contusion or strain of her neck.  This should improve with some physical therapy.  Would like to avoid muscle relaxers as that will increase her risk of falls.  Recommend heating pad and TENS unit.  Additionally she has some concussion symptoms.  Will ask vestibular rehab physical therapy to help with  balance and coordination to try to reduce her risk of future falls.  Recheck in 3 weeks or so.     Action/Discussion: Reviewed diagnosis, management options, expected outcomes, and the reasons for scheduled and emergent follow-up. Questions were adequately answered. Patient expressed verbal understanding and agreement with the following plan.     Patient Education: Reviewed with patient the risks (i.e, a repeat concussion, post-concussion syndrome, second-impact syndrome) of returning to play prior to complete resolution, and thoroughly reviewed the signs and symptoms of concussion.Reviewed need for complete resolution of all symptoms, with rest AND exertion, prior to return to play. Reviewed red flags for urgent medical evaluation: worsening symptoms, nausea/vomiting, intractable headache, musculoskeletal changes, focal neurological deficits. [facility]'s Concussion Care Plan, which clearly outlines the plans stated above, was given to patient.   Level of service: Total encounter time 30 minutes including face-to-face time with the patient and, reviewing past medical record, and charting on the date of service.        After Visit Summary printed out and provided to patient as appropriate.  The above documentation has been reviewed and is accurate and complete [name]

## 2022-10-25 ENCOUNTER — Telehealth: Payer: Self-pay | Admitting: Family Medicine

## 2022-10-25 NOTE — Telephone Encounter (Signed)
Patient called asking if we could refill the patient's traMADol (ULTRAM) 50 MG tablet that was originally sent in by the hospital?  Pharmacy: [facility] [address].

## 2022-10-28 MED ORDER — TRAMADOL HCL 50 MG PO TABS: 25.0000 mg | ORAL_TABLET | Freq: Four times a day (QID) | ORAL | 0 refills | Status: DC | PRN

## 2022-10-28 NOTE — Telephone Encounter (Signed)
Tramadol refilled.

## 2022-10-29 DIAGNOSIS — H04123 Dry eye syndrome of bilateral lacrimal glands: Secondary | ICD-10-CM | POA: Diagnosis not present

## 2022-10-29 DIAGNOSIS — H40013 Open angle with borderline findings, low risk, bilateral: Secondary | ICD-10-CM | POA: Diagnosis not present

## 2022-10-29 DIAGNOSIS — D3131 Benign neoplasm of right choroid: Secondary | ICD-10-CM | POA: Diagnosis not present

## 2022-10-29 DIAGNOSIS — H35373 Puckering of macula, bilateral: Secondary | ICD-10-CM | POA: Diagnosis not present

## 2022-10-30 ENCOUNTER — Telehealth: Payer: Self-pay | Admitting: Emergency Medicine

## 2022-10-30 DIAGNOSIS — J9611 Chronic respiratory failure with hypoxia: Secondary | ICD-10-CM

## 2022-10-30 NOTE — Telephone Encounter (Signed)
Pt calling in for [employer] to pick up the oxygen tanks

## 2022-11-01 ENCOUNTER — Ambulatory Visit: Payer: Medicare Other | Attending: Family Medicine

## 2022-11-01 DIAGNOSIS — R262 Difficulty in walking, not elsewhere classified: Secondary | ICD-10-CM | POA: Insufficient documentation

## 2022-11-01 DIAGNOSIS — M6281 Muscle weakness (generalized): Secondary | ICD-10-CM | POA: Diagnosis not present

## 2022-11-01 DIAGNOSIS — R42 Dizziness and giddiness: Secondary | ICD-10-CM | POA: Diagnosis not present

## 2022-11-01 DIAGNOSIS — R2681 Unsteadiness on feet: Secondary | ICD-10-CM | POA: Diagnosis not present

## 2022-11-01 DIAGNOSIS — S060X0D Concussion without loss of consciousness, subsequent encounter: Secondary | ICD-10-CM | POA: Insufficient documentation

## 2022-11-01 NOTE — Therapy (Signed)
OUTPATIENT PHYSICAL THERAPY VESTIBULAR EVALUATION     Patient Name: [name] MRN: [id number] DOB:[birth year], 87 y.o., female Today's Date: 11/01/2022  END OF SESSION:  PT End of Session - 11/01/22 1015     Visit Number 1    Number of Visits 8    Date for PT Re-Evaluation 11/29/22    Authorization Type [employer]    Progress Note Due on Visit 10    PT Start Time 1015    PT Stop Time 1100    PT Time Calculation (min) 45 min             Past Medical History:  Diagnosis Date   Allergy    Anxiety    Arthritis    no cartlidge in knees   Asthma    Bradycardia    a. atenolol stopped due to this, HR 40s.   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  CAD s/p RCA stent 2000 with residual mild-mod disease of left system 2001.   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   Osteoporosis 03/24/2015   Peripheral neuropathy 03/24/2015   Plantar fasciitis    Sleep apnea    Past Surgical History:  Procedure Laterality Date   BREAST LUMPECTOMY W/ NEEDLE LOCALIZATION  01/29/2011   Left with [facility] Dr Jamey Ripa   CHOLECYSTECTOMY  1955   CORONARY ANGIOPLASTY WITH STENT PLACEMENT  2000   DILATION AND CURETTAGE OF UTERUS  1976   and 1996   HAND SURGERY  1992   tendons between thumb and forefinger   SKIN BIOPSY     1994, 2006, 2008, 2010, 2011, 2011, 2012-  pre-cancerous   TONSILLECTOMY  1955   Patient Active Problem List   Diagnosis Date Noted   Insomnia 07/27/2022   Vitamin D deficiency 07/27/2022   Pulmonary hypertension (HCC) 11/07/2021   Bronchiectasis without complication (HCC) 11/07/2021   Hepatic  cirrhosis (HCC) 11/06/2021   ILD (interstitial lung disease) (HCC) 10/03/2021   Abnormal LFTs 09/28/2021   Weight loss 08/19/2021   Chronic respiratory failure with hypoxia (HCC) 08/19/2021   Debility 06/29/2021   Pneumonia 06/26/2021   Multifocal pneumonia 06/15/2021   Elevated troponin 06/12/2021   Febrile illness 05/22/2021   Anxiety state 10/23/2020   Pure hypercholesterolemia 10/23/2020   Urinary tract infectious disease 10/23/2020   Atherosclerosis of coronary artery 10/23/2020   Sinus node dysfunction (HCC) 10/23/2020   Mixed hyperlipidemia 10/23/2020   OSA on CPAP 10/23/2020   Fall 09/05/2020   Nausea 04/23/2020   Constipation 04/23/2020   Posterior uveitis, 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   Dysphagia 01/21/2020   CHF (congestive heart failure) (HCC) 11/23/2019   Exposure to COVID-19 virus 12/04/2018   Osteoarthritis 09/19/2018   Abscess of left leg 09/26/2017   Left leg cellulitis 09/18/2017   Whiplash 06/10/2017   Cough 05/29/2017   Concussion 05/29/2017   Hammer toes of both feet 12/19/2016   Allergic rhinitis 12/09/2016   Pedal edema 09/17/2016   Chest pain 07/18/2016   Gross hematuria 04/26/2016   Prediabetes 03/21/2016   Dysuria 12/07/2015   Fever  blister 12/07/2015   Choroidal nevus, 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   Malignant neoplasm of left female breast (HCC) 10/10/2015   Anxiety and depression 06/30/2015   Encounter for well adult exam with abnormal findings 06/30/2015   Neck mass 03/24/2015   Hypothyroidism 03/24/2015   Gout 03/24/2015   Essential hypertension 03/24/2015   Asthma 03/24/2015   Peripheral neuropathy 03/24/2015   Osteoporosis 03/24/2015   Morbid obesity (HCC) 03/24/2015   Long term current use of anticoagulant therapy 03/16/2015   Lymphadenopathy, cervical 03/16/2015   Diarrhea 03/16/2015   Hyperbilirubinemia 03/16/2015   Coronary arteriosclerosis 09/22/2014   Hyperlipidemia 07/23/2013   Hypokalemia 02/18/2013   Elevated bilirubin 02/18/2013    Morbid obesity with BMI of 45.0-49.9, adult (HCC) 10/29/2012   Rotator cuff tear arthropathy 10/29/2012   DJD (degenerative joint disease) of knee 10/29/2012   Plantar fasciitis    Arthritis    Hx of radiation therapy    Atrial fibrillation, chronic (HCC) 01/21/2011   Malignant neoplasm of upper-outer quadrant of left breast in female, estrogen receptor positive (HCC) 12/26/2010   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 Pt reports she was unable to arise due to bad knees.  Pt reports ongoing issue of soreness in head, neck, and shoulders more on left side and has pronounced soft tissue pain around the left scapular/humeral areas.  Denies any issues of dizziness, vertigo. Denies photo/phonophobia, or visual changes.  Pt accompanied by: self  PERTINENT HISTORY: [name],  is a 87 y.o. female who presents for initial evaluation of a head injury and multiple contusions. MOI: She fell/rolled out of bed due to her nightgown being tangled in the bedding. She was transported via EMS to the [facility] ED and dx w/ a levator scapula hematoma. Today, pt c/o cont'd confusion, soreness in her neck, shoulders, and thoracic spine. Her daughter notes a "bump" on the back of pt's head.   PAIN:  Are you having pain? Yes: NPRS scale: 9/10 Pain location: neck, back, knees, left shoulder Pain description: ache Aggravating factors: activity, overhead movements  Relieving  factors: extra strength tylenol  PRECAUTIONS: None  WEIGHT BEARING RESTRICTIONS: No  FALLS: Has patient fallen in last 6 months? Yes. Number of falls 1  LIVING ENVIRONMENT: Lives with: lives alone Lives in: House/apartment Stairs: Yes: Internal: 2-3 steps; can reach both and typically uses rollator , ramp for entering/exiting home Has following equipment at home: Walker - 4 wheeled  PLOF: Independent with household mobility with device and Independent with community mobility with device  PATIENT GOALS: reduce pain  OBJECTIVE:   DIAGNOSTIC FINDINGS:  IMPRESSION: No acute bony abnormality. Degenerative disc and facet disease throughout the cervical spine.   Enlargement of the left levator scapulae muscle with surrounding stranding most compatible with intramuscular hematoma.  COGNITION: Overall cognitive status: Within functional limits for tasks assessed   SENSATION: WNL  EDEMA:    MUSCLE TONE:  WNL   POSTURE:  No Significant postural limitations  Cervical ROM:  Active A/PROM (deg) eval  Flexion 50  Extension 40  Right lateral flexion 15  Left lateral flexion 15  Right rotation 15  Left rotation 20  (Blank rows = not tested)  UE ROM: WFL--able to elevate arms overhead full ROM but with discomfort in LUE  STRENGTH: 5/5 gross BUE  PALPATION:  multiple areas of tenderness/trigger points throughout left shoulder girdle and neck--more soft tissue than joint discomfort of the area  BED MOBILITY:  independent  TRANSFERS: Assistive device utilized: Environmental consultant - 4 wheeled  Sit to stand: Modified independence Stand to sit: Modified independence Chair to chair: Modified independence Floor:  NT  RAMP: Modified independence  CURB: Modified independence  GAIT: Gait pattern: WFL Distance walked:  Assistive device utilized: Environmental consultant - 4 wheeled Level of assistance: Modified independence Comments:   FUNCTIONAL TESTS:  Timed up and go (TUG): 23 sec w/  rollator Dynamic Gait Index: 13/24  M-CTSIB  Condition 1: Firm Surface, EO 30 Sec, Normal and Mild Sway  Condition 2: Firm Surface, EC 10 Sec, Moderate and Severe Sway  Condition 3: Foam Surface, EO 30 Sec, Normal Sway  Condition 4: Foam Surface, EC 4 Sec, Severe Sway       PATIENT EDUCATION: Education details: assessment findings, HEP initiated Person educated: Patient Education method: Explanation Education comprehension: verbalized understanding  HOME EXERCISE PROGRAM: Access Code: [email or web address] URL: [email or web address] Date: 11/01/2022 Prepared by: Shary Decamp  Exercises - Seated Cervical Sidebending Stretch  - 1 x daily - 7 x weekly - 3 sets - 30-60 sec hold - Seated Upper Trap Stretch  - 1 x daily - 7 x weekly - 3 sets - 30-60 sec hold    GOALS: Goals reviewed with patient? Yes  SHORT TERM GOALS: Target date: 11/15/2022    Patient will be independent in HEP to improve functional outcomes Baseline: Goal status: INITIAL    LONG TERM GOALS: Target date: 11/29/2022    Exhibit improved C-spine ROM to 25 degrees lateral flexion and 30+ degrees rotation to improve comfort and safety when driving Baseline:  Goal status: INITIAL  2.  Report left shoulder pain not exceeding 3/10 with overhead movements to improve comfort with household activity and self-care tasks Baseline: 9/10 Goal status: INITIAL  3.  Demo reduced risk for falls per time 15 sec TUG test Baseline: 23 sec w/ rollator Goal status: INITIAL  4.  Improved postural stability per mild sway conditions 2 and 4 M-CTSIB x 15 sec to improve safety with ADL Baseline: severe Goal status: INITIAL    ASSESSMENT:  CLINICAL IMPRESSION: Patient is a 87 y.o. lady who was seen today for physical therapy evaluation and treatment for symptoms following fall and concussion.  Thankfully, pt denies any overt symptoms of recent fall and striking head with chief complaint being general soreness, pain,  and discomfort in and around her cervical spine and left shoulder with appreciable tenderness to palpation and trigger points identified throughout cervical column and scapular areas.  Exhibits high risk for falls per outcome measures with some limitation from other comorbid orthopedic issues (knees).  Patinet would benefit from PT services to provide relevant intervention strategies to restore ability to PLOF and reduce pain for improved quality and comfort   OBJECTIVE IMPAIRMENTS: decreased activity tolerance, decreased balance, decreased ROM, increased muscle spasms, impaired flexibility, and pain.   ACTIVITY LIMITATIONS: carrying, bending, sleeping, dressing, and reach over head  PARTICIPATION LIMITATIONS: meal prep, cleaning, laundry, driving, shopping, and community activity  PERSONAL FACTORS: Time since onset of  injury/illness/exacerbation and 1-2 comorbidities: knee OA  are also affecting patient's functional outcome.   REHAB POTENTIAL: Excellent  CLINICAL DECISION MAKING: Stable/uncomplicated  EVALUATION COMPLEXITY: Low   PLAN:  PT FREQUENCY: 1-2x/week  PT DURATION: 4 weeks  PLANNED INTERVENTIONS: Therapeutic exercises, Therapeutic activity, Neuromuscular re-education, Balance training, Gait training, Patient/Family education, Self Care, Joint mobilization, Stair training, Vestibular training, Canalith repositioning, DME instructions, Aquatic Therapy, Dry Needling, Electrical stimulation, Spinal mobilization, Cryotherapy, Moist heat, Traction, Ultrasound, Ionotophoresis 4mg /ml Dexamethasone, and Manual therapy  PLAN FOR NEXT SESSION: soft tissue work for neck and left scapula. TENS unit set-up on her device. Additional ROM/stretching for shoulder/neck. Balance activities as indicated   12:00 PM, 11/01/22 M. Shary Decamp, PT, DPT Physical Therapist- [place] Office Number: [phone]

## 2022-11-05 ENCOUNTER — Ambulatory Visit (INDEPENDENT_AMBULATORY_CARE_PROVIDER_SITE_OTHER): Payer: Medicare Other

## 2022-11-05 VITALS — Ht 66.0 in | Wt 234.0 lb

## 2022-11-05 DIAGNOSIS — Z0000 Encounter for general adult medical examination without abnormal findings: Secondary | ICD-10-CM

## 2022-11-05 NOTE — Progress Notes (Signed)
Subjective:   [name] is a 87 y.o. female who presents for Medicare Annual (Subsequent) preventive examination.  Visit Complete: Virtual  I connected with  [name] on 11/05/22 by a audio enabled telemedicine application and verified that I am speaking with the correct person using two identifiers.  Patient Location: Home  Provider Location: Office/Clinic  I discussed the limitations of evaluation and management by telemedicine. The patient expressed understanding and agreed to proceed.  Review of Systems     Cardiac Risk Factors include: advanced age (>15men, >44 women);dyslipidemia;hypertension;obesity (BMI >30kg/m2);sedentary lifestyle;family history of premature cardiovascular disease     Objective:    Today's Vitals   11/05/22 1019  Weight: 234 lb (106.1 kg)  Height: 5\' 6"  (1.676 m)  PainSc: 2   PainLoc: Neck   Body mass index is 37.77 kg/m.     11/05/2022   10:24 AM 10/15/2022   10:24 PM 06/03/2022   10:28 AM 11/02/2021    9:17 AM 06/26/2021   12:00 PM 06/13/2021    4:08 PM 06/13/2021    9:43 AM  Advanced Directives  Does Patient Have a Medical Advance Directive? Yes Yes Yes Yes Yes Yes Yes  Type of Estate agent of [place];Living will Healthcare Power of [place];Living will Healthcare Power of [place];Living will Healthcare Power of [place];Living will Living will Living will   Does patient want to make changes to medical advance directive?     No - Patient declined No - Patient declined No - Patient declined  Copy of Healthcare Power of Attorney in Chart? No - copy requested   No - copy requested       Current Medications (verified) Outpatient Encounter Medications as of 11/05/2022  Medication Sig   albuterol (PROVENTIL) (2.5 MG/3ML) 0.083% nebulizer solution Take 3 mLs (2.5 mg total) by nebulization every 6 (six) hours as needed for wheezing or shortness of breath.   allopurinol (ZYLOPRIM) 300 MG tablet Take 300 mg by mouth daily.    ALPRAZolam (XANAX) 0.5 MG tablet TAKE 1 TO 2 TABLETS BY MOUTH TWICE DAILY AS NEEDED FOR ANXIETY OR SLEEP   AMBULATORY NON FORMULARY MEDICATION Medication Name: Prednisone Injection in knee   azelastine (ASTELIN) 0.1 % nasal spray Place 2 sprays into both nostrils daily. Use in each nostril as directed (Patient taking differently: Place 2 sprays into both nostrils in the morning.)   budesonide-formoterol (SYMBICORT) 80-4.5 MCG/ACT inhaler 2 puffs Inhalation Twice a day   co-enzyme Q-10 50 MG capsule Take 100 mg by mouth daily.   colchicine 0.6 MG tablet Take 0.6 mg by mouth daily as needed (flare  up).   cycloSPORINE (RESTASIS) 0.05 % ophthalmic emulsion Apply 1 drop to eye 2 (two) times daily as needed (allergies).   EPINEPHrine 0.3 mg/0.3 mL IJ SOAJ injection Inject 0.3 mg into the muscle as needed for anaphylaxis.   Evolocumab with Infusor (REPATHA PUSHTRONEX SYSTEM) 420 MG/3.5ML SOCT INJECT 1 UNIT INTO THE SKIN EVERY 30 DAYS   ezetimibe (ZETIA) 10 MG tablet Take 1 tablet (10 mg total) by mouth daily. Take on Tues & Fri   fexofenadine (ALLERGY RELIEF) 180 MG tablet Take 1 tablet (180 mg total) by mouth daily.   fluticasone (FLONASE) 50 MCG/ACT nasal spray Place 1 spray into both nostrils every morning.   fluticasone-salmeterol (ADVAIR DISKUS) 500-50 MCG/ACT AEPB 1 puff Inhalation Twice a day   fluticasone-salmeterol (ADVAIR) 500-50 MCG/ACT AEPB Inhale 1 puff into the lungs in the morning and at bedtime. (Patient  not taking: Reported on 10/02/2022)   gabapentin (NEURONTIN) 100 MG capsule Take by mouth.   Homeopathic Products (ARNICARE) GEL Apply 1 application  topically daily as needed (soreness).   hydrochlorothiazide (HYDRODIURIL) 25 MG tablet TAKE 2 TABLETS(50 MG) BY MOUTH DAILY   levalbuterol (XOPENEX HFA) 45 MCG/ACT inhaler Inhale 2 puffs into the lungs every 6 (six) hours as needed for wheezing or shortness of breath.   levocetirizine (XYZAL) 5 MG tablet Take 1 tablet (5 mg total) by mouth  every evening.   levothyroxine (SYNTHROID) 75 MCG tablet Take 1 tablet (75 mcg total) by mouth daily before breakfast.   metoprolol succinate (TOPROL-XL) 25 MG 24 hr tablet TAKE 1/2 TABLET BY MOUTH EVERY DAY   mirtazapine (REMERON) 15 MG tablet TAKE 1 TABLET(15 MG) BY MOUTH AT BEDTIME Please call in September 2024 to make an November 2024 appointment for more refills   montelukast (SINGULAIR) 10 MG tablet Take 1 tablet (10 mg total) by mouth at bedtime.   ondansetron (ZOFRAN) 4 MG tablet Take 1 tablet (4 mg total) by mouth every 8 (eight) hours as needed for nausea or vomiting.   pantoprazole (PROTONIX) 40 MG tablet Take 1 tablet (40 mg total) by mouth daily.   potassium chloride SA (KLOR-CON M) 20 MEQ tablet TAKE 1 TABLET BY MOUTH DAILY   rosuvastatin (CRESTOR) 10 MG tablet 1 tablet Orally Twice a week   traMADol (ULTRAM) 50 MG tablet Take 0.5-1 tablets (25-50 mg total) by mouth every 6 (six) hours as needed.   XARELTO 20 MG TABS tablet TAKE 1 TABLET(20 MG) BY MOUTH DAILY WITH SUPPER   No facility-administered encounter medications on file as of 11/05/2022.    Allergies (verified) Crestor [rosuvastatin calcium], Hydrocodone-acetaminophen, Lidocaine hcl, Lipitor [atorvastatin calcium], Procaine, Rosuvastatin, Theophylline, Vicodin [hydrocodone-acetaminophen], Codeine, Ephedrine, Other, Oxycodone, Oxycodone-acetaminophen, Propoxyphene, Quinine derivatives, Sulfa antibiotics, Epinephrine, Penicillins, Quinine, and Theophyllines   History: Past Medical History:  Diagnosis Date   Allergy    Anxiety    Arthritis    no cartlidge in knees   Asthma    Bradycardia    a. atenolol stopped due to this, HR 40s.   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  CAD s/p RCA stent 2000 with residual mild-mod disease of left system 2001.   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   Osteoporosis 03/24/2015   Peripheral neuropathy 03/24/2015   Plantar fasciitis    Sleep apnea    Past Surgical History:  Procedure Laterality Date   BREAST LUMPECTOMY W/ NEEDLE LOCALIZATION  01/29/2011   Left with [facility] Dr Jamey Ripa   CHOLECYSTECTOMY  1955   CORONARY ANGIOPLASTY WITH STENT PLACEMENT  2000   DILATION AND CURETTAGE OF UTERUS  1976   and 1996   HAND SURGERY  1992   tendons between thumb and forefinger   SKIN BIOPSY     1994, 2006, 2008, 2010, 2011, 2011, 2012-  pre-cancerous   TONSILLECTOMY  1955   Family History  Problem Relation Age of Onset   Heart disease Father    Heart attack Father  Rectal cancer Father    Asthma Sister    Heart disease Brother    Heart attack Brother    Stomach cancer Neg Hx    Colon cancer Neg Hx    Social History   Socioeconomic History   Marital status: Widowed    Spouse name: Not on file   Number of children: 3   Years of education: Not on file   Highest education level: Not on file  Occupational History   Not on file  Tobacco Use   Smoking status: Former    Packs/day: 1.00    Years: 4.00    Additional pack years: 0.00    Total pack years: 4.00    Types: Cigarettes    Quit date: 12/05/1962    Years since quitting: 59.9   Smokeless tobacco: Never  Vaping Use   Vaping Use: Never used  Substance and Sexual Activity   Alcohol use: No   Drug use: No   Sexual activity: Not Currently  Other Topics Concern   Not on file  Social History Narrative   Right handed.   Husband passed 04/28/2021. Married x 63 years.   2 daughters, who live nearby.    Grew up in [place], at [place].    Likes to swim.    Social Determinants of Health   Financial Resource Strain: Low Risk  (11/05/2022)    Overall Financial Resource Strain (CARDIA)    Difficulty of Paying Living Expenses: Not hard at all  Food Insecurity: No Food Insecurity (11/05/2022)   Hunger Vital Sign    Worried About Running Out of Food in the Last Year: Never true    Ran Out of Food in the Last Year: Never true  Transportation Needs: No Transportation Needs (11/05/2022)   PRAPARE - Administrator, Civil Service (Medical): No    Lack of Transportation (Non-Medical): No  Physical Activity: Inactive (11/05/2022)   Exercise Vital Sign    Days of Exercise per Week: 0 days    Minutes of Exercise per Session: 0 min  Stress: No Stress Concern Present (11/05/2022)   [employer] of Occupational Health - Occupational Stress Questionnaire    Feeling of Stress : Not at all  Social Connections: Socially Integrated (11/05/2022)   Social Connection and Isolation Panel [NHANES]    Frequency of Communication with Friends and Family: More than three times a week    Frequency of Social Gatherings with Friends and Family: More than three times a week    Attends Religious Services: More than 4 times per year    Active Member of [employer] or Organizations: Yes    Attends Engineer, structural: More than 4 times per year    Marital Status: Married    Tobacco Counseling Counseling given: Not Answered   Clinical Intake:  Pre-visit preparation completed: Yes  Pain : 0-10 Pain Score: 2  Pain Type: Acute pain     BMI - recorded: 37.77 Nutritional Status: BMI > 30  Obese Nutritional Risks: None Diabetes: No  How often do you need to have someone help you when you read instructions, pamphlets, or other written materials from your doctor or pharmacy?: 1 - Never What is the last grade level you completed in school?: Master's Degree; Post Graduate  Interpreter Needed?: No  Information entered by :: Susie Cassette, LPN.   Activities of Daily Living    11/05/2022   10:28 AM  In your present state of health, do  you have  any difficulty performing the following activities:  Hearing? 0  Vision? 0  Difficulty concentrating or making decisions? 0  Walking or climbing stairs? 1  Dressing or bathing? 0  Doing errands, shopping? 0  Preparing Food and eating ? N  Using the Toilet? N  In the past six months, have you accidently leaked urine? Y  Do you have problems with loss of bowel control? N  Managing your Medications? N  Managing your Finances? N  Housekeeping or managing your Housekeeping? Y    Patient Care Team: [name], MD as PCP - General (Internal Medicine) Corky Crafts, MD as PCP - Cardiology (Cardiology) Cyndia Bent, MD (Inactive) as Surgeon (General Surgery) Magrinat, Valentino Hue, MD (Inactive) (Hematology and Oncology) Lurline Hare, MD (Radiation Oncology) Drema Dallas, DO as Consulting Physician (Neurology)  Indicate any recent Medical Services you may have received from other than [facility] providers in the past year (date may be approximate).     Assessment:   This is a routine wellness examination for [place].  Hearing/Vision screen Hearing Screening - Comments:: Denies hearing difficulties.   Vision Screening - Comments:: Wears rx glasses - up to date with routine eye exams with [facility].   Dietary issues and exercise activities discussed:     Goals Addressed               This Visit's Progress     My healthcare goal for 2024 is to maintain my health, stay alive, eat healthy and start physical therapy. (pt-stated)        Depression Screen    11/05/2022   10:27 AM 07/24/2022    2:05 PM 05/21/2022   10:19 AM 05/21/2022    9:53 AM 11/22/2021    2:51 PM 11/22/2021    2:17 PM 11/02/2021    9:10 AM  PHQ 2/9 Scores  PHQ - 2 Score 0 0 0 0 1 0 0  PHQ- 9 Score 0 0    0     Fall Risk    11/05/2022   10:20 AM 07/24/2022    2:05 PM 06/03/2022   10:28 AM 05/21/2022   10:19 AM 05/21/2022    9:53 AM  Fall Risk   Falls in the past year? 1 0 0 0 0   Number falls in past yr: 0 0 0 0 0  Injury with Fall? 1 0 0 0 0  Risk for fall due to : Other (Comment);History of fall(s);Impaired mobility;Impaired balance/gait No Fall Risks   No Fall Risks  Follow up Education provided;Falls prevention discussed;Falls evaluation completed Falls evaluation completed;Education provided Falls evaluation completed  Falls evaluation completed    MEDICARE RISK AT HOME:  Medicare Risk at Home - 11/05/22 1025     Any stairs in or around the home? No    If so, are there any without handrails? No    Home free of loose throw rugs in walkways, pet beds, electrical cords, etc? Yes    Adequate lighting in your home to reduce risk of falls? Yes    Life alert? Yes   [place]   Use of a cane, walker or w/c? Yes    Grab bars in the bathroom? Yes    Shower chair or bench in shower? Yes    Elevated toilet seat or a handicapped toilet? Yes             TIMED UP AND GO:  Was the test performed?  No    Cognitive Function:  11/05/2022   10:27 AM 11/02/2021    9:23 AM  6CIT Screen  What Year? 0 points 0 points  What month? 0 points 0 points  What time? 0 points 0 points  Count back from 20 0 points 0 points  Months in reverse 0 points 0 points  Repeat phrase 0 points 0 points  Total Score 0 points 0 points    Immunizations Immunization History  Administered Date(s) Administered   Fluad Quad(high Dose 65+) 03/30/2019, 01/21/2020, 04/24/2021, 02/12/2022   Influenza Split 04/06/2009, 04/05/2010, 01/10/2011, 02/10/2012, 04/23/2013   Influenza, High Dose Seasonal PF 03/20/2017, 01/19/2018   Influenza-Unspecified 05/08/2015, 01/23/2016   PFIZER(Purple Top)SARS-COV-2 Vaccination 06/11/2019, 07/06/2019, 12/29/2019, 02/28/2020, 12/18/2020   Pneumococcal Conjugate-13 06/01/2013, 03/20/2017   Pneumococcal Polysaccharide-23 03/16/2003, 01/23/2016, 12/18/2020   Td 12/08/2009   Tdap 04/19/2022   Zoster Recombinant(Shingrix) 06/07/2015, 03/18/2016   Zoster,  Live 06/07/2015    TDAP status: Up to date  Flu Vaccine status: Up to date  Pneumococcal vaccine status: Up to date  Covid-19 vaccine status: Completed vaccines  Qualifies for Shingles Vaccine? Yes   Zostavax completed Yes   Shingrix Completed?: Yes  Screening Tests Health Maintenance  Topic Date Due   COVID-19 Vaccine (6 - 2023-24 season) 01/04/2022   INFLUENZA VACCINE  12/05/2022   Medicare Annual Wellness (AWV)  11/05/2023   DTaP/Tdap/Td (3 - Td or Tdap) 04/19/2032   Pneumonia Vaccine 39+ Years old  Completed   DEXA SCAN  Completed   Zoster Vaccines- Shingrix  Completed   HPV VACCINES  Aged Out    Health Maintenance  Health Maintenance Due  Topic Date Due   COVID-19 Vaccine (6 - 2023-24 season) 01/04/2022    Colorectal cancer screening: No longer required.   Mammogram status: Completed 04/03/2022. Repeat every year  Bone Density scan: Patient reported completed 09/04/2014  Lung Cancer Screening: (Low Dose CT Chest recommended if Age 86-80 years, 20 pack-year currently smoking OR have quit w/in 15years.) does not qualify.   Lung Cancer Screening Referral: no  Additional Screening:  Hepatitis C Screening: does not qualify; Completed: no  Vision Screening: Recommended annual ophthalmology exams for early detection of glaucoma and other disorders of the eye. Is the patient up to date with their annual eye exam?  Yes  Who is the provider or what is the name of the office in which the patient attends annual eye exams? [facility] If pt is not established with a provider, would they like to be referred to a provider to establish care? No .   Dental Screening: Recommended annual dental exams for proper oral hygiene  Diabetic Foot Exam: N/A  Community Resource Referral / Chronic Care Management: CRR required this visit?  No   CCM required this visit?  No     Plan:     I have personally reviewed and noted the following in the patient's chart:    Medical and social history Use of alcohol, tobacco or illicit drugs  Current medications and supplements including opioid prescriptions. Patient is not currently taking opioid prescriptions. Functional ability and status Nutritional status Physical activity Advanced directives List of other physicians Hospitalizations, surgeries, and ER visits in previous 12 months Vitals Screenings to include cognitive, depression, and falls Referrals and appointments  In addition, I have reviewed and discussed with patient certain preventive protocols, quality metrics, and best practice recommendations. A written personalized care plan for preventive services as well as general preventive health recommendations were provided to patient.     Percell Miller  Francis Dowse, LPN   05/11/1094   After Visit Summary: (Mail) Due to this being a telephonic visit, the after visit summary with patients personalized plan was offered to patient via mail   Nurse Notes: Normal cognitive status assessed by direct observation via telephone conversation by this Nurse Health Advisor. No abnormalities found.

## 2022-11-05 NOTE — Addendum Note (Signed)
Addended by: Maurene Capes on: 11/05/2022 02:15 PM   Modules accepted: Orders

## 2022-11-05 NOTE — Telephone Encounter (Signed)
Called and spoke with patient. She was calling to request an order to have [employer] to pickup her oxygen tanks. She has not used the oxygen in over 6 months. She is still paying a monthly fee for the oxygen. She had called back in December for the same reason but we were not able to reach her. I explained to her that RB had recommended that she come in for a walk test or complete a walk test with the DME to make sure that she does not need the oxygen. But that he was also ok with [place] just placing the order and stating it was the patient's preference.   She does not want to complete the walk tests and just wants the order placed. I advised her that I would go ahead and place the order now.   Nothing further needed at time of call.

## 2022-11-05 NOTE — Patient Instructions (Addendum)
[name] , Thank you for taking time to come for your Medicare Wellness Visit. I appreciate your ongoing commitment to your health goals. Please review the following plan we discussed and let me know if I can assist you in the future.   These are the goals we discussed:  Goals       My healthcare goal for 2024 is to maintain my health, stay alive, eat healthy and start physical therapy. (pt-stated)        This is a list of the screening recommended for you and due dates:  Health Maintenance  Topic Date Due   COVID-19 Vaccine (6 - 2023-24 season) 01/04/2022   Flu Shot  12/05/2022   Medicare Annual Wellness Visit  11/05/2023   DTaP/Tdap/Td vaccine (3 - Td or Tdap) 04/19/2032   Pneumonia Vaccine  Completed   DEXA scan (bone density measurement)  Completed   Zoster (Shingles) Vaccine  Completed   HPV Vaccine  Aged Out    Advanced directives: Yes  Conditions/risks identified: Yes  Next appointment: It was nice speaking with you today!  Please follow up in one year for your annual wellness visit via telephone call with Nurse Percell Miller on 11/10/2023 at 9:45 a.m.  If you need to cancel or reschedule please call [phone].   Preventive Care 26 Years and Older, Female Preventive care refers to lifestyle choices and visits with your health care provider that can promote health and wellness. What does preventive care include? A yearly physical exam. This is also called an annual well check. Dental exams once or twice a year. Routine eye exams. Ask your health care provider how often you should have your eyes checked. Personal lifestyle choices, including: Daily care of your teeth and gums. Regular physical activity. Eating a healthy diet. Avoiding tobacco and drug use. Limiting alcohol use. Practicing safe sex. Taking low-dose aspirin every day. Taking vitamin and mineral supplements as recommended by your health care provider. What happens during an annual well check? The  services and screenings done by your health care provider during your annual well check will depend on your age, overall health, lifestyle risk factors, and family history of disease. Counseling  Your health care provider may ask you questions about your: Alcohol use. Tobacco use. Drug use. Emotional well-being. Home and relationship well-being. Sexual activity. Eating habits. History of falls. Memory and ability to understand (cognition). Work and work Astronomer. Reproductive health. Screening  You may have the following tests or measurements: Height, weight, and BMI. Blood pressure. Lipid and cholesterol levels. These may be checked every 5 years, or more frequently if you are over 7 years old. Skin check. Lung cancer screening. You may have this screening every year starting at age 26 if you have a 30-pack-year history of smoking and currently smoke or have quit within the past 15 years. Fecal occult blood test (FOBT) of the stool. You may have this test every year starting at age 66. Flexible sigmoidoscopy or colonoscopy. You may have a sigmoidoscopy every 5 years or a colonoscopy every 10 years starting at age 64. Hepatitis C blood test. Hepatitis B blood test. Sexually transmitted disease (STD) testing. Diabetes screening. This is done by checking your blood sugar (glucose) after you have not eaten for a while (fasting). You may have this done every 1-3 years. Bone density scan. This is done to screen for osteoporosis. You may have this done starting at age 97. Mammogram. This may be done every 1-2 years. Talk to  your health care provider about how often you should have regular mammograms. Talk with your health care provider about your test results, treatment options, and if necessary, the need for more tests. Vaccines  Your health care provider may recommend certain vaccines, such as: Influenza vaccine. This is recommended every year. Tetanus, diphtheria, and acellular  pertussis (Tdap, Td) vaccine. You may need a Td booster every 10 years. Zoster vaccine. You may need this after age 51. Pneumococcal 13-valent conjugate (PCV13) vaccine. One dose is recommended after age 26. Pneumococcal polysaccharide (PPSV23) vaccine. One dose is recommended after age 83. Talk to your health care provider about which screenings and vaccines you need and how often you need them. This information is not intended to replace advice given to you by your health care provider. Make sure you discuss any questions you have with your health care provider. Document Released: 05/19/2015 Document Revised: 01/10/2016 Document Reviewed: 02/21/2015 Elsevier Interactive Patient Education  2017 [place] Prevention in the Home Falls can cause injuries. They can happen to people of all ages. There are many things you can do to make your home safe and to help prevent falls. What can I do on the outside of my home? Regularly fix the edges of walkways and driveways and fix any cracks. Remove anything that might make you trip as you walk through a door, such as a raised step or threshold. Trim any bushes or trees on the path to your home. Use bright outdoor lighting. Clear any walking paths of anything that might make someone trip, such as rocks or tools. Regularly check to see if handrails are loose or broken. Make sure that both sides of any steps have handrails. Any raised decks and porches should have guardrails on the edges. Have any leaves, snow, or ice cleared regularly. Use sand or salt on walking paths during winter. Clean up any spills in your garage right away. This includes oil or grease spills. What can I do in the bathroom? Use night lights. Install grab bars by the toilet and in the tub and shower. Do not use towel bars as grab bars. Use non-skid mats or decals in the tub or shower. If you need to sit down in the shower, use a plastic, non-slip stool. Keep the floor  dry. Clean up any water that spills on the floor as soon as it happens. Remove soap buildup in the tub or shower regularly. Attach bath mats securely with double-sided non-slip rug tape. Do not have throw rugs and other things on the floor that can make you trip. What can I do in the bedroom? Use night lights. Make sure that you have a light by your bed that is easy to reach. Do not use any sheets or blankets that are too big for your bed. They should not hang down onto the floor. Have a firm chair that has side arms. You can use this for support while you get dressed. Do not have throw rugs and other things on the floor that can make you trip. What can I do in the kitchen? Clean up any spills right away. Avoid walking on wet floors. Keep items that you use a lot in easy-to-reach places. If you need to reach something above you, use a strong step stool that has a grab bar. Keep electrical cords out of the way. Do not use floor polish or wax that makes floors slippery. If you must use wax, use non-skid floor wax. Do not  have throw rugs and other things on the floor that can make you trip. What can I do with my stairs? Do not leave any items on the stairs. Make sure that there are handrails on both sides of the stairs and use them. Fix handrails that are broken or loose. Make sure that handrails are as long as the stairways. Check any carpeting to make sure that it is firmly attached to the stairs. Fix any carpet that is loose or worn. Avoid having throw rugs at the top or bottom of the stairs. If you do have throw rugs, attach them to the floor with carpet tape. Make sure that you have a light switch at the top of the stairs and the bottom of the stairs. If you do not have them, ask someone to add them for you. What else can I do to help prevent falls? Wear shoes that: Do not have high heels. Have rubber bottoms. Are comfortable and fit you well. Are closed at the toe. Do not wear  sandals. If you use a stepladder: Make sure that it is fully opened. Do not climb a closed stepladder. Make sure that both sides of the stepladder are locked into place. Ask someone to hold it for you, if possible. Clearly mark and make sure that you can see: Any grab bars or handrails. First and last steps. Where the edge of each step is. Use tools that help you move around (mobility aids) if they are needed. These include: Canes. Walkers. Scooters. Crutches. Turn on the lights when you go into a dark area. Replace any light bulbs as soon as they burn out. Set up your furniture so you have a clear path. Avoid moving your furniture around. If any of your floors are uneven, fix them. If there are any pets around you, be aware of where they are. Review your medicines with your doctor. Some medicines can make you feel dizzy. This can increase your chance of falling. Ask your doctor what other things that you can do to help prevent falls. This information is not intended to replace advice given to you by your health care provider. Make sure you discuss any questions you have with your health care provider. Document Released: 02/16/2009 Document Revised: 09/28/2015 Document Reviewed: 05/27/2014 Elsevier Interactive Patient Education  2017 [employer].

## 2022-11-08 NOTE — Therapy (Signed)
OUTPATIENT PHYSICAL THERAPY VESTIBULAR TREATMENT     Patient Name: [name] MRN: [id number] DOB:09-Nov-1935, 87 y.o., female Today's Date: 11/11/2022  END OF SESSION:  PT End of Session - 11/11/22 0845     Visit Number 2    Number of Visits 8    Date for PT Re-Evaluation 11/29/22    Authorization Type [employer] Medicare    Progress Note Due on Visit 10    PT Start Time [phone]   pt late   PT Stop Time 0845    PT Time Calculation (min) 38 min    Activity Tolerance Patient tolerated treatment well    Behavior During Therapy WFL for tasks assessed/performed              Past Medical History:  Diagnosis Date   Allergy    Anxiety    Arthritis    no cartlidge in knees   Asthma    Bradycardia    a. atenolol stopped due to this, HR 40s.   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  CAD s/p RCA stent 2000 with residual mild-mod disease of left system 2001.   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   Osteoporosis 03/24/2015   Peripheral neuropathy 03/24/2015   Plantar fasciitis    Sleep apnea    Past Surgical History:  Procedure Laterality Date   BREAST LUMPECTOMY W/ NEEDLE LOCALIZATION  01/29/2011   Left with [facility] Dr Jamey Ripa   CHOLECYSTECTOMY  1955   CORONARY ANGIOPLASTY WITH STENT PLACEMENT  2000   DILATION AND CURETTAGE OF UTERUS  1976   and 1996   HAND SURGERY  1992   tendons between thumb and forefinger   SKIN BIOPSY     1994, 2006, 2008, 2010, 2011, 2011, 2012-  pre-cancerous   TONSILLECTOMY  1955   Patient Active Problem List   Diagnosis Date Noted   Insomnia 07/27/2022   Vitamin D  deficiency 07/27/2022   Pulmonary hypertension (HCC) 11/07/2021   Bronchiectasis without complication (HCC) 11/07/2021   Hepatic cirrhosis (HCC) 11/06/2021   ILD (interstitial lung disease) (HCC) 10/03/2021   Abnormal LFTs 09/28/2021   Weight loss 08/19/2021   Chronic respiratory failure with hypoxia (HCC) 08/19/2021   Debility 06/29/2021   Pneumonia 06/26/2021   Multifocal pneumonia 06/15/2021   Elevated troponin 06/12/2021   Febrile illness 05/22/2021   Anxiety state 10/23/2020   Pure hypercholesterolemia 10/23/2020   Urinary tract infectious disease 10/23/2020   Atherosclerosis of coronary artery 10/23/2020   Sinus node dysfunction (HCC) 10/23/2020   Mixed hyperlipidemia 10/23/2020   OSA on CPAP 10/23/2020   Fall 09/05/2020   Nausea 04/23/2020   Constipation 04/23/2020   Posterior uveitis, 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   Dysphagia 01/21/2020   CHF (congestive heart failure) (HCC) 11/23/2019   Exposure to COVID-19 virus 12/04/2018   Osteoarthritis 09/19/2018   Abscess of left leg 09/26/2017   Left leg cellulitis 09/18/2017   Whiplash 06/10/2017   Cough 05/29/2017   Concussion 05/29/2017   Hammer toes of both feet 12/19/2016   Allergic rhinitis 12/09/2016   Pedal  edema 09/17/2016   Chest pain 07/18/2016   Gross hematuria 04/26/2016   Prediabetes 03/21/2016   Dysuria 12/07/2015   Fever blister 12/07/2015   Choroidal nevus, 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   Malignant neoplasm of left female breast (HCC) 10/10/2015   Anxiety and depression 06/30/2015   Encounter for well adult exam with abnormal findings 06/30/2015   Neck mass 03/24/2015   Hypothyroidism 03/24/2015   Gout 03/24/2015   Essential hypertension 03/24/2015   Asthma 03/24/2015   Peripheral neuropathy 03/24/2015   Osteoporosis 03/24/2015   Morbid obesity (HCC) 03/24/2015   Long term current use of anticoagulant therapy 03/16/2015   Lymphadenopathy, cervical 03/16/2015   Diarrhea 03/16/2015   Hyperbilirubinemia 03/16/2015    Coronary arteriosclerosis 09/22/2014   Hyperlipidemia 07/23/2013   Hypokalemia 02/18/2013   Elevated bilirubin 02/18/2013   Morbid obesity with BMI of 45.0-49.9, adult (HCC) 10/29/2012   Rotator cuff tear arthropathy 10/29/2012   DJD (degenerative joint disease) of knee 10/29/2012   Plantar fasciitis    Arthritis    Hx of radiation therapy    Atrial fibrillation, chronic (HCC) 01/21/2011   Malignant neoplasm of upper-outer quadrant of left breast in female, estrogen receptor positive (HCC) 12/26/2010   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   Pt accompanied by: self  PERTINENT HISTORY: [name],  is a 87 y.o. female who presents for initial evaluation of a head injury and multiple contusions. MOI: She fell/rolled out of bed due to her nightgown being tangled in the bedding. She was transported via EMS to the [facility] ED and dx w/ a levator scapula hematoma. Today, pt c/o cont'd confusion, soreness in her neck, shoulders, and thoracic spine. Her daughter notes a "bump" on the back of pt's head.   PAIN:  Are you having pain? Yes: NPRS scale: 6/10 Pain location: "the whole neck" Pain description: ache Aggravating factors: activity, overhead movements  Relieving factors: extra strength tylenol  PRECAUTIONS: None  WEIGHT BEARING RESTRICTIONS: No  FALLS: Has patient fallen in last 6 months? Yes. Number of falls 1  LIVING ENVIRONMENT: Lives with: lives alone Lives in:  House/apartment Stairs: Yes: Internal: 2-3 steps; can reach both and typically uses rollator , ramp for entering/exiting home Has following equipment at home: Dan Humphreys - 4 wheeled  PLOF: Independent with household mobility with device and Independent with community mobility with device  PATIENT GOALS: reduce pain  OBJECTIVE:     TODAY'S TREATMENT: 11/08/22 Activity Comments  review of HEP: cervical sidebending stretch 30" each UT stretch 30" With very gentle OP; more limited to L. To tolerance  STM and manual TPR to B neck  Significant soft tissue restriction and TTP in B scalenes, UT, LS, cervical paraspinals, and suboccipitals. Good tolerance. Some areas of redness over the skin where pressure was applied  cervical retraction 10x3" Good form; very gentle self-OP  cervical rotation SNAG 10x each To tolerance; cueing for proper hand positioning and to avoid pushing into pain   cervical extension SNAG To tolerance; cueing for proper hand  positioning    Cervical ROM:    Active A/PROM (deg) eval AROM 11/11/22  Flexion 50 30  Extension 40 22  Right lateral flexion 15 18  Left lateral flexion 15 14  Right rotation 15 28  Left rotation 20 40  (Blank rows = not tested)   HOME EXERCISE PROGRAM Last updated: 11/12/22 Access Code: [email or web address] URL: [email or web address] Date: 11/11/2022 Prepared by: [facility]  Exercises - Seated Cervical Sidebending Stretch  - 1 x daily - 7 x weekly - 3 sets - 30-60 sec hold - Seated Upper Trap Stretch  - 1 x daily - 7 x weekly - 3 sets - 30-60 sec hold - Cervical Retraction with Overpressure  - 1 x daily - 5 x weekly - 2 sets - 10 reps - 3 sec hold - Seated Assisted Cervical Rotation with Towel  - 1 x daily - 5 x weekly - 2 sets - 10 reps - Mid-Lower Cervical Extension SNAG with Strap  - 1 x daily - 5 x weekly - 2 sets - 10 reps   PATIENT EDUCATION: Education details: advised pt to bring in her TENS  unit d/t questions on its use; HEP update Person educated: Patient Education method: Explanation, Demonstration, Tactile cues, Verbal cues, and Handouts Education comprehension: verbalized understanding and returned demonstration     Below measures were taken at time of initial evaluation unless otherwise specified:   DIAGNOSTIC FINDINGS:  IMPRESSION: No acute bony abnormality. Degenerative disc and facet disease throughout the cervical spine.   Enlargement of the left levator scapulae muscle with surrounding stranding most compatible with intramuscular hematoma.  COGNITION: Overall cognitive status: Within functional limits for tasks assessed   SENSATION: WNL  EDEMA:    MUSCLE TONE:  WNL   POSTURE:  No Significant postural limitations  Cervical ROM:    Active A/PROM (deg) eval  Flexion 50  Extension 40  Right lateral flexion 15  Left lateral flexion 15  Right rotation 15  Left rotation 20  (Blank rows = not tested)  UE ROM: WFL--able to elevate arms overhead full ROM but with discomfort in LUE  STRENGTH: 5/5 gross BUE  PALPATION:  multiple areas of tenderness/trigger points throughout left shoulder girdle and neck--more soft tissue than joint discomfort of the area  BED MOBILITY:  independent  TRANSFERS: Assistive device utilized: Environmental consultant - 4 wheeled  Sit to stand: Modified independence Stand to sit: Modified independence Chair to chair: Modified independence Floor:  NT  RAMP: Modified independence  CURB: Modified independence  GAIT: Gait pattern: WFL Distance walked:  Assistive device utilized: Environmental consultant - 4 wheeled Level of assistance: Modified independence Comments:   FUNCTIONAL TESTS:  Timed up and go (TUG): 23 sec w/ rollator Dynamic Gait Index: 13/24  M-CTSIB  Condition 1: Firm Surface, EO 30 Sec, Normal and Mild Sway  Condition 2: Firm Surface, EC 10 Sec, Moderate and Severe Sway  Condition 3: Foam Surface, EO 30 Sec, Normal Sway   Condition 4: Foam Surface, EC 4 Sec, Severe Sway       PATIENT EDUCATION: Education details: assessment findings, HEP initiated Person educated: Patient Education method: Explanation Education comprehension: verbalized understanding  HOME EXERCISE PROGRAM: Access Code: [email or web address] URL: [email or web address] Date: 11/01/2022 Prepared by: [name]  Exercises - Seated Cervical Sidebending Stretch  - 1 x daily - 7 x weekly - 3 sets - 30-60 sec hold - Seated Upper Trap Stretch  - 1 x daily - 7 x weekly -  3 sets - 30-60 sec hold    GOALS: Goals reviewed with patient? Yes  SHORT TERM GOALS: Target date: 11/15/2022    Patient will be independent in HEP to improve functional outcomes Baseline: Goal status: IN PROGRESS    LONG TERM GOALS: Target date: 11/29/2022    Exhibit improved C-spine ROM to 25 degrees lateral flexion and 30+ degrees rotation to improve comfort and safety when driving Baseline:  Goal status: IN PROGRESS  2.  Report left shoulder pain not exceeding 3/10 with overhead movements to improve comfort with household activity and self-care tasks Baseline: 9/10 Goal status: IN PROGRESS  3.  Demo reduced risk for falls per time 15 sec TUG test Baseline: 23 sec w/ rollator Goal status: IN PROGRESS  4.  Improved postural stability per mild sway conditions 2 and 4 M-CTSIB x 15 sec to improve safety with ADL Baseline: severe Goal status: IN PROGRESS    ASSESSMENT:  CLINICAL IMPRESSION: Patient arrived to session with report of slight improvement in neck pain. Reviewed HEP which was well-tolerated despite significantly limited ROM. Patient tolerated MT to cervical musculature with good response as patient able to observable move neck through larger ROM after MT. Proceeded with progression of gentle cervical strengthening and stretching. Patient remarked on improved ROM at end of session.   OBJECTIVE IMPAIRMENTS: decreased activity tolerance,  decreased balance, decreased ROM, increased muscle spasms, impaired flexibility, and pain.   ACTIVITY LIMITATIONS: carrying, bending, sleeping, dressing, and reach over head  PARTICIPATION LIMITATIONS: meal prep, cleaning, laundry, driving, shopping, and community activity  PERSONAL FACTORS: Time since onset of injury/illness/exacerbation and 1-2 comorbidities: knee OA  are also affecting patient's functional outcome.   REHAB POTENTIAL: Excellent  CLINICAL DECISION MAKING: Stable/uncomplicated  EVALUATION COMPLEXITY: Low   PLAN:  PT FREQUENCY: 1-2x/week  PT DURATION: 4 weeks  PLANNED INTERVENTIONS: Therapeutic exercises, Therapeutic activity, Neuromuscular re-education, Balance training, Gait training, Patient/Family education, Self Care, Joint mobilization, Stair training, Vestibular training, Canalith repositioning, DME instructions, Aquatic Therapy, Dry Needling, Electrical stimulation, Spinal mobilization, Cryotherapy, Moist heat, Traction, Ultrasound, Ionotophoresis 4mg /ml Dexamethasone, and Manual therapy  PLAN FOR NEXT SESSION: soft tissue work for neck and left scapula. TENS unit set-up on her device. Additional ROM/stretching for shoulder/neck. Balance activities as indicated   Anette Guarneri, [place], DPT 11/11/22 8:46 AM  [facility] at [facility] [address] [place], [address] [place], [place] [address] Phone # [phone] Fax # [phone]

## 2022-11-11 ENCOUNTER — Encounter: Payer: Self-pay | Admitting: Physical Therapy

## 2022-11-11 ENCOUNTER — Ambulatory Visit: Payer: Medicare Other | Attending: Family Medicine | Admitting: Physical Therapy

## 2022-11-11 DIAGNOSIS — R2681 Unsteadiness on feet: Secondary | ICD-10-CM | POA: Diagnosis not present

## 2022-11-11 DIAGNOSIS — M542 Cervicalgia: Secondary | ICD-10-CM | POA: Diagnosis not present

## 2022-11-11 DIAGNOSIS — R42 Dizziness and giddiness: Secondary | ICD-10-CM | POA: Insufficient documentation

## 2022-11-11 DIAGNOSIS — M6281 Muscle weakness (generalized): Secondary | ICD-10-CM | POA: Diagnosis not present

## 2022-11-11 DIAGNOSIS — R262 Difficulty in walking, not elsewhere classified: Secondary | ICD-10-CM | POA: Diagnosis not present

## 2022-11-13 ENCOUNTER — Ambulatory Visit: Payer: Medicare Other

## 2022-11-13 DIAGNOSIS — M170 Bilateral primary osteoarthritis of knee: Secondary | ICD-10-CM | POA: Diagnosis not present

## 2022-11-13 DIAGNOSIS — R42 Dizziness and giddiness: Secondary | ICD-10-CM

## 2022-11-13 DIAGNOSIS — R2681 Unsteadiness on feet: Secondary | ICD-10-CM | POA: Diagnosis not present

## 2022-11-13 DIAGNOSIS — M6281 Muscle weakness (generalized): Secondary | ICD-10-CM | POA: Diagnosis not present

## 2022-11-13 DIAGNOSIS — R262 Difficulty in walking, not elsewhere classified: Secondary | ICD-10-CM | POA: Diagnosis not present

## 2022-11-13 DIAGNOSIS — M1711 Unilateral primary osteoarthritis, right knee: Secondary | ICD-10-CM | POA: Diagnosis not present

## 2022-11-13 DIAGNOSIS — M1712 Unilateral primary osteoarthritis, left knee: Secondary | ICD-10-CM | POA: Diagnosis not present

## 2022-11-13 DIAGNOSIS — M542 Cervicalgia: Secondary | ICD-10-CM | POA: Diagnosis not present

## 2022-11-13 NOTE — Therapy (Signed)
OUTPATIENT PHYSICAL THERAPY VESTIBULAR TREATMENT     Patient Name: [name] MRN: [id number] DOB:[birth year], 87 y.o., female Today's Date: 11/13/2022  END OF SESSION:  PT End of Session - 11/13/22 0810     Visit Number 3    Number of Visits 8    Date for PT Re-Evaluation 11/29/22    Authorization Type [employer]    Progress Note Due on Visit 10    PT Start Time 0809   late arrival   PT Stop Time 0845    PT Time Calculation (min) 36 min    Activity Tolerance Patient tolerated treatment well    Behavior During Therapy WFL for tasks assessed/performed              Past Medical History:  Diagnosis Date   Allergy    Anxiety    Arthritis    no cartlidge in knees   Asthma    Bradycardia    a. atenolol stopped due to this, HR 40s.   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  CAD s/p RCA stent 2000 with residual mild-mod disease of left system 2001.   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   Osteoporosis 03/24/2015   Peripheral neuropathy 03/24/2015   Plantar fasciitis    Sleep apnea    Past Surgical History:  Procedure Laterality Date   BREAST LUMPECTOMY W/ NEEDLE LOCALIZATION  01/29/2011   Left with [facility] Dr Jamey Ripa   CHOLECYSTECTOMY  1955   CORONARY ANGIOPLASTY WITH STENT PLACEMENT  2000   DILATION AND CURETTAGE OF UTERUS  1976   and 1996   HAND SURGERY  1992   tendons between thumb and forefinger   SKIN BIOPSY     1994, 2006, 2008, 2010, 2011, 2011, 2012-  pre-cancerous   TONSILLECTOMY  1955   Patient Active Problem List   Diagnosis Date Noted   Insomnia 07/27/2022   Vitamin D  deficiency 07/27/2022   Pulmonary hypertension (HCC) 11/07/2021   Bronchiectasis without complication (HCC) 11/07/2021   Hepatic cirrhosis (HCC) 11/06/2021   ILD (interstitial lung disease) (HCC) 10/03/2021   Abnormal LFTs 09/28/2021   Weight loss 08/19/2021   Chronic respiratory failure with hypoxia (HCC) 08/19/2021   Debility 06/29/2021   Pneumonia 06/26/2021   Multifocal pneumonia 06/15/2021   Elevated troponin 06/12/2021   Febrile illness 05/22/2021   Anxiety state 10/23/2020   Pure hypercholesterolemia 10/23/2020   Urinary tract infectious disease 10/23/2020   Atherosclerosis of coronary artery 10/23/2020   Sinus node dysfunction (HCC) 10/23/2020   Mixed hyperlipidemia 10/23/2020   OSA on CPAP 10/23/2020   Fall 09/05/2020   Nausea 04/23/2020   Constipation 04/23/2020   Posterior uveitis, 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   Dysphagia 01/21/2020   CHF (congestive heart failure) (HCC) 11/23/2019   Exposure to COVID-19 virus 12/04/2018   Osteoarthritis 09/19/2018   Abscess of left leg 09/26/2017   Left leg cellulitis 09/18/2017   Whiplash 06/10/2017   Cough 05/29/2017   Concussion 05/29/2017   Hammer toes of both feet 12/19/2016   Allergic rhinitis 12/09/2016   Pedal  edema 09/17/2016   Chest pain 07/18/2016   Gross hematuria 04/26/2016   Prediabetes 03/21/2016   Dysuria 12/07/2015   Fever blister 12/07/2015   Choroidal nevus, 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   Malignant neoplasm of left female breast (HCC) 10/10/2015   Anxiety and depression 06/30/2015   Encounter for well adult exam with abnormal findings 06/30/2015   Neck mass 03/24/2015   Hypothyroidism 03/24/2015   Gout 03/24/2015   Essential hypertension 03/24/2015   Asthma 03/24/2015   Peripheral neuropathy 03/24/2015   Osteoporosis 03/24/2015   Morbid obesity (HCC) 03/24/2015   Long term current use of anticoagulant therapy 03/16/2015   Lymphadenopathy, cervical 03/16/2015   Diarrhea 03/16/2015   Hyperbilirubinemia 03/16/2015    Coronary arteriosclerosis 09/22/2014   Hyperlipidemia 07/23/2013   Hypokalemia 02/18/2013   Elevated bilirubin 02/18/2013   Morbid obesity with BMI of 45.0-49.9, adult (HCC) 10/29/2012   Rotator cuff tear arthropathy 10/29/2012   DJD (degenerative joint disease) of knee 10/29/2012   Plantar fasciitis    Arthritis    Hx of radiation therapy    Atrial fibrillation, chronic (HCC) 01/21/2011   Malignant neoplasm of upper-outer quadrant of left breast in female, estrogen receptor positive (HCC) 12/26/2010   Breast cancer (IL[facility]) Receptor + her2 _ 12/26/2010    PCP: [name], MD REFERRING PROVIDER: Rodolph Bong, MD  REFERRING DIAG:  S06.0X0D (ICD-10-CM) - Concussion without loss of consciousness, subsequent encounter    THERAPY DIAG:  Dizziness and giddiness  Unsteadiness on feet  Muscle weakness (generalized)  Difficulty in walking, not elsewhere classified  ONSET DATE: 10/15/22  Rationale for Evaluation and Treatment: Rehabilitation  SUBJECTIVE:   SUBJECTIVE STATEMENT: Improving day by day  Pt accompanied by: self  PERTINENT HISTORY: [name],  is a 87 y.o. female who presents for initial evaluation of a head injury and multiple contusions. MOI: She fell/rolled out of bed due to her nightgown being tangled in the bedding. She was transported via EMS to the [facility] ED and dx w/ a levator scapula hematoma. Today, pt c/o cont'd confusion, soreness in her neck, shoulders, and thoracic spine. Her daughter notes a "bump" on the back of pt's head.   PAIN:  Are you having pain? Yes: NPRS scale: 6/10 Pain location: "the whole neck" Pain description: ache Aggravating factors: activity, overhead movements  Relieving factors: extra strength tylenol  PRECAUTIONS: None  WEIGHT BEARING RESTRICTIONS: No  FALLS: Has patient fallen in last 6 months? Yes. Number of falls 1  LIVING ENVIRONMENT: Lives with: lives alone Lives in: House/apartment Stairs: Yes: Internal:  2-3 steps; can reach both and typically uses rollator , ramp for entering/exiting home Has following equipment at home: Dan Humphreys - 4 wheeled  PLOF: Independent with household mobility with device and Independent with community mobility with device  PATIENT GOALS: reduce pain  OBJECTIVE:    TODAY'S TREATMENT: 11/13/22 Activity Comments  Supine w/ MHP to neck x 5 min   Manual therapy: separate from all other interventions -soft tissue mobilization bilat cervical column L>R to address myofascial pain and trigger points -suboccipital release with pt perfrming active shoulder depression for stretch/relax -PROM cervical range all directions 5x10 3 sec end-range hold to improve mobility and reduce pain/guarding  Biofreeze applied to bilateral neck               TODAY'S TREATMENT: 11/08/22 Activity Comments  review of HEP: cervical sidebending stretch 30" each UT stretch 30" With very gentle OP; more limited to L. To tolerance  STM  and manual TPR to B neck  Significant soft tissue restriction and TTP in B scalenes, UT, LS, cervical paraspinals, and suboccipitals. Good tolerance. Some areas of redness over the skin where pressure was applied  cervical retraction 10x3" Good form; very gentle self-OP  cervical rotation SNAG 10x each To tolerance; cueing for proper hand positioning and to avoid pushing into pain   cervical extension SNAG To tolerance; cueing for proper hand positioning      HOME EXERCISE PROGRAM Last updated: 11/12/22 Access Code: [email or web address] URL: [email or web address] Date: 11/11/2022 Prepared by: [facility] - Outpatient  Rehab - [facility]  Exercises - Seated Cervical Sidebending Stretch  - 1 x daily - 7 x weekly - 3 sets - 30-60 sec hold - Seated Upper Trap Stretch  - 1 x daily - 7 x weekly - 3 sets - 30-60 sec hold - Cervical Retraction with Overpressure  - 1 x daily - 5 x weekly - 2 sets - 10 reps - 3 sec hold - Seated Assisted Cervical Rotation with  Towel  - 1 x daily - 5 x weekly - 2 sets - 10 reps - Mid-Lower Cervical Extension SNAG with Strap  - 1 x daily - 5 x weekly - 2 sets - 10 reps   PATIENT EDUCATION: Education details: advised pt to bring in her TENS unit d/t questions on its use; HEP update Person educated: Patient Education method: Explanation, Demonstration, Tactile cues, Verbal cues, and Handouts Education comprehension: verbalized understanding and returned demonstration     Below measures were taken at time of initial evaluation unless otherwise specified:   DIAGNOSTIC FINDINGS:  IMPRESSION: No acute bony abnormality. Degenerative disc and facet disease throughout the cervical spine.   Enlargement of the left levator scapulae muscle with surrounding stranding most compatible with intramuscular hematoma.  COGNITION: Overall cognitive status: Within functional limits for tasks assessed   SENSATION: WNL  EDEMA:    MUSCLE TONE:  WNL   POSTURE:  No Significant postural limitations  Cervical ROM:    Active A/PROM (deg) eval  Flexion 50  Extension 40  Right lateral flexion 15  Left lateral flexion 15  Right rotation 15  Left rotation 20  (Blank rows = not tested)  UE ROM: WFL--able to elevate arms overhead full ROM but with discomfort in LUE  STRENGTH: 5/5 gross BUE  PALPATION:  multiple areas of tenderness/trigger points throughout left shoulder girdle and neck--more soft tissue than joint discomfort of the area  BED MOBILITY:  independent  TRANSFERS: Assistive device utilized: Environmental consultant - 4 wheeled  Sit to stand: Modified independence Stand to sit: Modified independence Chair to chair: Modified independence Floor:  NT  RAMP: Modified independence  CURB: Modified independence  GAIT: Gait pattern: WFL Distance walked:  Assistive device utilized: Environmental consultant - 4 wheeled Level of assistance: Modified independence Comments:   FUNCTIONAL TESTS:  Timed up and go (TUG): 23 sec w/  rollator Dynamic Gait Index: 13/24  M-CTSIB  Condition 1: Firm Surface, EO 30 Sec, Normal and Mild Sway  Condition 2: Firm Surface, EC 10 Sec, Moderate and Severe Sway  Condition 3: Foam Surface, EO 30 Sec, Normal Sway  Condition 4: Foam Surface, EC 4 Sec, Severe Sway        GOALS: Goals reviewed with patient? Yes  SHORT TERM GOALS: Target date: 11/15/2022    Patient will be independent in HEP to improve functional outcomes Baseline: Goal status: IN PROGRESS    LONG TERM GOALS: Target date: 11/29/2022  Exhibit improved C-spine ROM to 25 degrees lateral flexion and 30+ degrees rotation to improve comfort and safety when driving Baseline:  Goal status: IN PROGRESS  2.  Report left shoulder pain not exceeding 3/10 with overhead movements to improve comfort with household activity and self-care tasks Baseline: 9/10 Goal status: IN PROGRESS  3.  Demo reduced risk for falls per time 15 sec TUG test Baseline: 23 sec w/ rollator Goal status: IN PROGRESS  4.  Improved postural stability per mild sway conditions 2 and 4 M-CTSIB x 15 sec to improve safety with ADL Baseline: severe Goal status: IN PROGRESS    ASSESSMENT:  CLINICAL IMPRESSION: Pt reports good response to last session's soft tissue mobilization and reports increased ROM and decreased pain post-tx.  Good response to today's session as well with activities performed in supine to promote gravity-minimized position for muscle relaxation and allowing more PROM.  Tolerating PROM for rotation to approx 30 degrees before pain/restriction.  Recommend continued gentle ROM for HEP and will continue with manual interventions and training in use of TENS for home use.   OBJECTIVE IMPAIRMENTS: decreased activity tolerance, decreased balance, decreased ROM, increased muscle spasms, impaired flexibility, and pain.   ACTIVITY LIMITATIONS: carrying, bending, sleeping, dressing, and reach over head  PARTICIPATION LIMITATIONS:  meal prep, cleaning, laundry, driving, shopping, and community activity  PERSONAL FACTORS: Time since onset of injury/illness/exacerbation and 1-2 comorbidities: knee OA  are also affecting patient's functional outcome.   REHAB POTENTIAL: Excellent  CLINICAL DECISION MAKING: Stable/uncomplicated  EVALUATION COMPLEXITY: Low   PLAN:  PT FREQUENCY: 1-2x/week  PT DURATION: 4 weeks  PLANNED INTERVENTIONS: Therapeutic exercises, Therapeutic activity, Neuromuscular re-education, Balance training, Gait training, Patient/Family education, Self Care, Joint mobilization, Stair training, Vestibular training, Canalith repositioning, DME instructions, Aquatic Therapy, Dry Needling, Electrical stimulation, Spinal mobilization, Cryotherapy, Moist heat, Traction, Ultrasound, Ionotophoresis 4mg /ml Dexamethasone, and Manual therapy  PLAN FOR NEXT SESSION: soft tissue work for neck and left scapula. TENS unit set-up on her device. Additional ROM/stretching for shoulder/neck. Balance activities as indicated   9:36 AM, 11/13/22 M. Shary Decamp, PT, DPT Physical Therapist- [place] Office Number: [phone]

## 2022-11-18 ENCOUNTER — Ambulatory Visit: Payer: Medicare Other

## 2022-11-18 DIAGNOSIS — R262 Difficulty in walking, not elsewhere classified: Secondary | ICD-10-CM

## 2022-11-18 DIAGNOSIS — M6281 Muscle weakness (generalized): Secondary | ICD-10-CM

## 2022-11-18 DIAGNOSIS — R2681 Unsteadiness on feet: Secondary | ICD-10-CM

## 2022-11-18 DIAGNOSIS — M542 Cervicalgia: Secondary | ICD-10-CM | POA: Diagnosis not present

## 2022-11-18 DIAGNOSIS — R42 Dizziness and giddiness: Secondary | ICD-10-CM

## 2022-11-18 NOTE — Therapy (Signed)
OUTPATIENT PHYSICAL THERAPY VESTIBULAR TREATMENT     Patient Name: [name] MRN: [id number] DOB:10/19/1935, 87 y.o., female Today's Date: 11/18/2022  END OF SESSION:  PT End of Session - 11/18/22 0807     Visit Number 4    Number of Visits 8    Date for PT Re-Evaluation 11/29/22    Authorization Type [employer]    Progress Note Due on Visit 10    PT Start Time 0806    PT Stop Time 0845    PT Time Calculation (min) 39 min    Activity Tolerance Patient tolerated treatment well    Behavior During Therapy WFL for tasks assessed/performed              Past Medical History:  Diagnosis Date   Allergy    Anxiety    Arthritis    no cartlidge in knees   Asthma    Bradycardia    a. atenolol stopped due to this, HR 40s.   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  CAD s/p RCA stent 2000 with residual mild-mod disease of left system 2001.   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   Osteoporosis 03/24/2015   Peripheral neuropathy 03/24/2015   Plantar fasciitis    Sleep apnea    Past Surgical History:  Procedure Laterality Date   BREAST LUMPECTOMY W/ NEEDLE LOCALIZATION  01/29/2011   Left with [facility] Dr Jamey Ripa   CHOLECYSTECTOMY  1955   CORONARY ANGIOPLASTY WITH STENT PLACEMENT  2000   DILATION AND CURETTAGE OF UTERUS  1976   and 1996   HAND SURGERY  1992   tendons between thumb and forefinger   SKIN BIOPSY     1994, 2006, 2008, 2010, 2011, 2011, 2012-  pre-cancerous   TONSILLECTOMY  1955   Patient Active Problem List   Diagnosis Date Noted   Insomnia 07/27/2022   Vitamin D deficiency  07/27/2022   Pulmonary hypertension (HCC) 11/07/2021   Bronchiectasis without complication (HCC) 11/07/2021   Hepatic cirrhosis (HCC) 11/06/2021   ILD (interstitial lung disease) (HCC) 10/03/2021   Abnormal LFTs 09/28/2021   Weight loss 08/19/2021   Chronic respiratory failure with hypoxia (HCC) 08/19/2021   Debility 06/29/2021   Pneumonia 06/26/2021   Multifocal pneumonia 06/15/2021   Elevated troponin 06/12/2021   Febrile illness 05/22/2021   Anxiety state 10/23/2020   Pure hypercholesterolemia 10/23/2020   Urinary tract infectious disease 10/23/2020   Atherosclerosis of coronary artery 10/23/2020   Sinus node dysfunction (HCC) 10/23/2020   Mixed hyperlipidemia 10/23/2020   OSA on CPAP 10/23/2020   Fall 09/05/2020   Nausea 04/23/2020   Constipation 04/23/2020   Posterior uveitis, 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   Dysphagia 01/21/2020   CHF (congestive heart failure) (HCC) 11/23/2019   Exposure to COVID-19 virus 12/04/2018   Osteoarthritis 09/19/2018   Abscess of left leg 09/26/2017   Left leg cellulitis 09/18/2017   Whiplash 06/10/2017   Cough 05/29/2017   Concussion 05/29/2017   Hammer toes of both feet 12/19/2016   Allergic rhinitis 12/09/2016   Pedal edema 09/17/2016  Chest pain 07/18/2016   Gross hematuria 04/26/2016   Prediabetes 03/21/2016   Dysuria 12/07/2015   Fever blister 12/07/2015   Choroidal nevus, 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   Malignant neoplasm of left female breast (HCC) 10/10/2015   Anxiety and depression 06/30/2015   Encounter for well adult exam with abnormal findings 06/30/2015   Neck mass 03/24/2015   Hypothyroidism 03/24/2015   Gout 03/24/2015   Essential hypertension 03/24/2015   Asthma 03/24/2015   Peripheral neuropathy 03/24/2015   Osteoporosis 03/24/2015   Morbid obesity (HCC) 03/24/2015   Long term current use of anticoagulant therapy 03/16/2015   Lymphadenopathy, cervical 03/16/2015   Diarrhea 03/16/2015   Hyperbilirubinemia 03/16/2015   Coronary  arteriosclerosis 09/22/2014   Hyperlipidemia 07/23/2013   Hypokalemia 02/18/2013   Elevated bilirubin 02/18/2013   Morbid obesity with BMI of 45.0-49.9, adult (HCC) 10/29/2012   Rotator cuff tear arthropathy 10/29/2012   DJD (degenerative joint disease) of knee 10/29/2012   Plantar fasciitis    Arthritis    Hx of radiation therapy    Atrial fibrillation, chronic (HCC) 01/21/2011   Malignant neoplasm of upper-outer quadrant of left breast in female, estrogen receptor positive (HCC) 12/26/2010   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  Neck is feeling so much better after massage.  Got cortizone shots for knees and that is also helping.   Pt accompanied by: self  PERTINENT HISTORY: [name],  is a 87 y.o. female who presents for initial evaluation of a head injury and multiple contusions. MOI: She fell/rolled out of bed due to her nightgown being tangled in the bedding. She was transported via EMS to the [facility] ED and dx w/ a levator scapula hematoma. Today, pt c/o cont'd confusion, soreness in her neck, shoulders, and thoracic spine. Her daughter notes a "bump" on the back of pt's head.   PAIN:  Are you having pain? Yes: NPRS scale: 3/10 Pain location: "the whole neck" Pain description: ache Aggravating factors: activity, overhead movements  Relieving factors: extra strength tylenol  PRECAUTIONS: None  WEIGHT BEARING RESTRICTIONS: No  FALLS: Has patient fallen in last 6 months? Yes. Number of  falls 1  LIVING ENVIRONMENT: Lives with: lives alone Lives in: House/apartment Stairs: Yes: Internal: 2-3 steps; can reach both and typically uses rollator , ramp for entering/exiting home Has following equipment at home: Dan Humphreys - 4 wheeled  PLOF: Independent with household mobility with device and Independent with community mobility with device  PATIENT GOALS: reduce pain  OBJECTIVE:   TODAY'S TREATMENT: 11/18/22 Activity Comments  Education and set-up of personal TENS unit. Application of MHP x 10 min   Manual therapy Soft tissue mobilization left cervical column, scapula to address myofascial pain/trigger points x 25 min  HEP review               TODAY'S TREATMENT: 11/13/22 Activity Comments  Supine w/ MHP to neck x 5 min   Manual therapy: separate from all other interventions -soft tissue mobilization bilat cervical column L>R to address myofascial pain and trigger points -suboccipital  release with pt perfrming active shoulder depression for stretch/relax -PROM cervical range all directions 5x10 3 sec end-range hold to improve mobility and reduce pain/guarding  Biofreeze applied to bilateral neck                HOME EXERCISE PROGRAM Last updated: 11/12/22 Access Code: [email or web address] URL: [email or web address] Date: 11/11/2022 Prepared by: [facility]  Exercises - Seated Cervical Sidebending Stretch  - 1 x daily - 7 x weekly - 3 sets - 30-60 sec hold - Seated Upper Trap Stretch  - 1 x daily - 7 x weekly - 3 sets - 30-60 sec hold - Cervical Retraction with Overpressure  - 1 x daily - 5 x weekly - 2 sets - 10 reps - 3 sec hold - Seated Assisted Cervical Rotation with Towel  - 1 x daily - 5 x weekly - 2 sets - 10 reps - Mid-Lower Cervical Extension SNAG with Strap  - 1 x daily - 5 x weekly - 2 sets - 10 reps   PATIENT EDUCATION: Education details: advised pt to bring in her TENS unit d/t questions on its use; HEP  update Person educated: Patient Education method: Explanation, Demonstration, Tactile cues, Verbal cues, and Handouts Education comprehension: verbalized understanding and returned demonstration     Below measures were taken at time of initial evaluation unless otherwise specified:   DIAGNOSTIC FINDINGS:  IMPRESSION: No acute bony abnormality. Degenerative disc and facet disease throughout the cervical spine.   Enlargement of the left levator scapulae muscle with surrounding stranding most compatible with intramuscular hematoma.  COGNITION: Overall cognitive status: Within functional limits for tasks assessed   SENSATION: WNL  EDEMA:    MUSCLE TONE:  WNL   POSTURE:  No Significant postural limitations  Cervical ROM:    Active A/PROM (deg) eval  Flexion 50  Extension 40  Right lateral flexion 15  Left lateral flexion 15  Right rotation 15  Left rotation 20  (Blank rows = not tested)  UE ROM: WFL--able to elevate arms overhead full ROM but with discomfort in LUE  STRENGTH: 5/5 gross BUE  PALPATION:  multiple areas of tenderness/trigger points throughout left shoulder girdle and neck--more soft tissue than joint discomfort of the area  BED MOBILITY:  independent  TRANSFERS: Assistive device utilized: Environmental consultant - 4 wheeled  Sit to stand: Modified independence Stand to sit: Modified independence Chair to chair: Modified independence Floor:  NT  RAMP: Modified independence  CURB: Modified independence  GAIT: Gait pattern: WFL Distance walked:  Assistive device utilized: Environmental consultant - 4 wheeled Level of assistance: Modified independence Comments:   FUNCTIONAL TESTS:  Timed up and go (TUG): 23 sec w/ rollator Dynamic Gait Index: 13/24  M-CTSIB  Condition 1: Firm Surface, EO 30 Sec, Normal and Mild Sway  Condition 2: Firm Surface, EC 10 Sec, Moderate and Severe Sway  Condition 3: Foam Surface, EO 30 Sec, Normal Sway  Condition 4: Foam Surface, EC 4  Sec, Severe Sway        GOALS: Goals reviewed with patient? Yes  SHORT TERM GOALS: Target date: 11/15/2022    Patient will be independent in HEP to improve functional outcomes Baseline: Goal status: IN PROGRESS    LONG TERM GOALS: Target date: 11/29/2022    Exhibit improved C-spine ROM to 25 degrees lateral flexion and 30+ degrees rotation to improve comfort and safety when driving Baseline:  Goal status: IN PROGRESS  2.  Report left shoulder pain  not exceeding 3/10 with overhead movements to improve comfort with household activity and self-care tasks Baseline: 9/10 Goal status: IN PROGRESS  3.  Demo reduced risk for falls per time 15 sec TUG test Baseline: 23 sec w/ rollator Goal status: IN PROGRESS  4.  Improved postural stability per mild sway conditions 2 and 4 M-CTSIB x 15 sec to improve safety with ADL Baseline: severe Goal status: IN PROGRESS    ASSESSMENT:  CLINICAL IMPRESSION: Presents with personal TENS unit and provide relevant instruction in placement and set-up for use.  Proceeded with soft tissue mobilization which was tolerated very well and demo improved neck ROM w/ less pain by end of session. 100% return demo of current HEP. Continued sessions to reduce pain/guarding to improve mobility  OBJECTIVE IMPAIRMENTS: decreased activity tolerance, decreased balance, decreased ROM, increased muscle spasms, impaired flexibility, and pain.   ACTIVITY LIMITATIONS: carrying, bending, sleeping, dressing, and reach over head  PARTICIPATION LIMITATIONS: meal prep, cleaning, laundry, driving, shopping, and community activity  PERSONAL FACTORS: Time since onset of injury/illness/exacerbation and 1-2 comorbidities: knee OA  are also affecting patient's functional outcome.   REHAB POTENTIAL: Excellent  CLINICAL DECISION MAKING: Stable/uncomplicated  EVALUATION COMPLEXITY: Low   PLAN:  PT FREQUENCY: 1-2x/week  PT DURATION: 4 weeks  PLANNED INTERVENTIONS:  Therapeutic exercises, Therapeutic activity, Neuromuscular re-education, Balance training, Gait training, Patient/Family education, Self Care, Joint mobilization, Stair training, Vestibular training, Canalith repositioning, DME instructions, Aquatic Therapy, Dry Needling, Electrical stimulation, Spinal mobilization, Cryotherapy, Moist heat, Traction, Ultrasound, Ionotophoresis 4mg /ml Dexamethasone, and Manual therapy  PLAN FOR NEXT SESSION: soft tissue work for neck and left scapula. Dry needling? Marland Kitchen Additional ROM/stretching for shoulder/neck. Balance activities as indicated   8:09 AM, 11/18/22 M. Shary Decamp, PT, DPT Physical Therapist- [place] Office Number: [phone]

## 2022-11-19 ENCOUNTER — Ambulatory Visit: Payer: Medicare Other | Admitting: Internal Medicine

## 2022-11-19 NOTE — Therapy (Signed)
OUTPATIENT PHYSICAL THERAPY VESTIBULAR TREATMENT     Patient Name: [name] MRN: [id number] DOB:09-Jun-1935, 87 y.o., female Today's Date: 11/21/2022  END OF SESSION:  PT End of Session - 11/21/22 1015     Visit Number 5    Number of Visits 8    Date for PT Re-Evaluation 11/29/22    Authorization Type [employer]    Progress Note Due on Visit 10    PT Start Time [phone]    PT Stop Time 1013    PT Time Calculation (min) 40 min    Activity Tolerance Patient tolerated treatment well    Behavior During Therapy WFL for tasks assessed/performed               Past Medical History:  Diagnosis Date   Allergy    Anxiety    Arthritis    no cartlidge in knees   Asthma    Bradycardia    a. atenolol stopped due to this, HR 40s.   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  CAD s/p RCA stent 2000 with residual mild-mod disease of left system 2001.   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   Osteoporosis 03/24/2015   Peripheral neuropathy 03/24/2015   Plantar fasciitis    Sleep apnea    Past Surgical History:  Procedure Laterality Date   BREAST LUMPECTOMY W/ NEEDLE LOCALIZATION  01/29/2011   Left with [facility] Dr Jamey Ripa   CHOLECYSTECTOMY  1955   CORONARY ANGIOPLASTY WITH STENT PLACEMENT  2000   DILATION AND CURETTAGE OF UTERUS  1976   and 1996   HAND SURGERY  1992   tendons between thumb and forefinger   SKIN BIOPSY     1994, 2006, 2008, 2010, 2011, 2011, 2012-  pre-cancerous   TONSILLECTOMY  1955   Patient Active Problem List   Diagnosis Date Noted   Insomnia 07/27/2022   Vitamin D deficiency  07/27/2022   Pulmonary hypertension (HCC) 11/07/2021   Bronchiectasis without complication (HCC) 11/07/2021   Hepatic cirrhosis (HCC) 11/06/2021   ILD (interstitial lung disease) (HCC) 10/03/2021   Abnormal LFTs 09/28/2021   Weight loss 08/19/2021   Chronic respiratory failure with hypoxia (HCC) 08/19/2021   Debility 06/29/2021   Pneumonia 06/26/2021   Multifocal pneumonia 06/15/2021   Elevated troponin 06/12/2021   Febrile illness 05/22/2021   Anxiety state 10/23/2020   Pure hypercholesterolemia 10/23/2020   Urinary tract infectious disease 10/23/2020   Atherosclerosis of coronary artery 10/23/2020   Sinus node dysfunction (HCC) 10/23/2020   Mixed hyperlipidemia 10/23/2020   OSA on CPAP 10/23/2020   Fall 09/05/2020   Nausea 04/23/2020   Constipation 04/23/2020   Posterior uveitis, 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   Dysphagia 01/21/2020   CHF (congestive heart failure) (HCC) 11/23/2019   Exposure to COVID-19 virus 12/04/2018   Osteoarthritis 09/19/2018   Abscess of left leg 09/26/2017   Left leg cellulitis 09/18/2017   Whiplash 06/10/2017   Cough 05/29/2017   Concussion 05/29/2017   Hammer toes of both feet 12/19/2016   Allergic rhinitis 12/09/2016   Pedal edema 09/17/2016  Chest pain 07/18/2016   Gross hematuria 04/26/2016   Prediabetes 03/21/2016   Dysuria 12/07/2015   Fever blister 12/07/2015   Choroidal nevus, 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   Malignant neoplasm of left female breast (HCC) 10/10/2015   Anxiety and depression 06/30/2015   Encounter for well adult exam with abnormal findings 06/30/2015   Neck mass 03/24/2015   Hypothyroidism 03/24/2015   Gout 03/24/2015   Essential hypertension 03/24/2015   Asthma 03/24/2015   Peripheral neuropathy 03/24/2015   Osteoporosis 03/24/2015   Morbid obesity (HCC) 03/24/2015   Long term current use of anticoagulant therapy 03/16/2015   Lymphadenopathy, cervical 03/16/2015   Diarrhea 03/16/2015   Hyperbilirubinemia 03/16/2015   Coronary  arteriosclerosis 09/22/2014   Hyperlipidemia 07/23/2013   Hypokalemia 02/18/2013   Elevated bilirubin 02/18/2013   Morbid obesity with BMI of 45.0-49.9, adult (HCC) 10/29/2012   Rotator cuff tear arthropathy 10/29/2012   DJD (degenerative joint disease) of knee 10/29/2012   Plantar fasciitis    Arthritis    Hx of radiation therapy    Atrial fibrillation, chronic (HCC) 01/21/2011   Malignant neoplasm of upper-outer quadrant of left breast in female, estrogen receptor positive (HCC) 12/26/2010   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 Asking if DN will be done today. Reports that she had it before with good benefit. Denies N/T or weakness.   Pt accompanied by: self  PERTINENT HISTORY: [name],  is a 87 y.o. female who presents for initial evaluation of a head injury and multiple contusions. MOI: She fell/rolled out of bed due to her nightgown being tangled in the bedding. She was transported via EMS to the [facility] ED and dx w/ a levator scapula hematoma. Today, pt c/o cont'd confusion, soreness in her neck, shoulders, and thoracic spine. Her daughter notes a "bump" on the back of pt's head.   PAIN:  Are you having pain? Yes: NPRS scale: 6/10 Pain location: "the whole neck" Pain description: ache Aggravating factors: activity, overhead movements  Relieving factors: extra strength tylenol  PRECAUTIONS: None  WEIGHT  BEARING RESTRICTIONS: No  FALLS: Has patient fallen in last 6 months? Yes. Number of falls 1  LIVING ENVIRONMENT: Lives with: lives alone Lives in: House/apartment Stairs: Yes: Internal: 2-3 steps; can reach both and typically uses rollator , ramp for entering/exiting home Has following equipment at home: Dan Humphreys - 4 wheeled  PLOF: Independent with household mobility with device and Independent with community mobility with device  PATIENT GOALS: reduce pain  OBJECTIVE:     TODAY'S TREATMENT: 11/21/22 Activity Comments  Skilled palpation and monitoring of tissues during TPDN Significant soft tissue restriction in B UT and cervical paraspinals   Manual STM and trigger point release  Significant soft tissue restriction in B UT and cervical paraspinals; good relief. pt reported "it's so much better"      Trigger Point Dry-Needling  Treatment instructions: Expect mild to moderate muscle soreness. S/S of pneumothorax if dry needled over a lung field,  and to seek immediate medical attention should they occur. Patient verbalized understanding of these instructions and education.  Patient Consent Given: Yes Education handout provided: Yes Muscles treated: B cervical multifidi and UT using standard technique  Electrical stimulation performed: No Parameters: N/A Treatment response/outcome: great twitch response and tolerated well.    PATIENT EDUCATION: Education details: edu on DN benefits, expectations, possible adverse effects, and post-DN care ; encouraged MHP on neck 10-15 min and continued HEP to address any soreness  Person educated: Patient Education method: Explanation, Demonstration, Tactile cues, Verbal cues, and Handouts Education comprehension: verbalized understanding and returned demonstration    HOME EXERCISE PROGRAM Last updated: 11/12/22 Access Code: [email or web address] URL: [email or web address] Date: 11/11/2022 Prepared by: [facility] - Outpatient  Re[facility] - [facility]  Exercises - Seated Cervical Sidebending Stretch  - 1 x daily - 7 x weekly - 3 sets - 30-60 sec hold - Seated Upper Trap Stretch  - 1 x daily - 7 x weekly - 3 sets - 30-60 sec hold - Cervical Retraction with Overpressure  - 1 x daily - 5 x weekly - 2 sets - 10 reps - 3 sec hold - Seated Assisted Cervical Rotation with Towel  - 1 x daily - 5 x weekly - 2 sets - 10 reps - Mid-Lower Cervical Extension SNAG with Strap  - 1 x daily - 5 x weekly - 2 sets - 10 reps    Below measures were taken at time of initial evaluation unless otherwise specified:   DIAGNOSTIC FINDINGS:  IMPRESSION: No acute bony abnormality. Degenerative disc and facet disease throughout the cervical spine.   Enlargement of the left levator scapulae muscle with surrounding stranding most compatible with intramuscular hematoma.  COGNITION: Overall cognitive status: Within functional limits for tasks assessed   SENSATION: WNL  EDEMA:    MUSCLE TONE:  WNL   POSTURE:  No Significant postural limitations  Cervical ROM:    Active A/PROM (deg) eval  Flexion 50  Extension 40  Right lateral flexion 15  Left lateral flexion 15  Right rotation 15  Left rotation 20  (Blank rows = not tested)  UE ROM: WFL--able to elevate arms overhead full ROM but with discomfort in LUE  STRENGTH: 5/5 gross BUE  PALPATION:  multiple areas of tenderness/trigger points throughout left shoulder girdle and neck--more soft tissue than joint discomfort of the area  BED MOBILITY:  independent  TRANSFERS: Assistive device utilized: Environmental consultant - 4 wheeled  Sit to stand: Modified independence Stand to sit: Modified independence Chair to chair: Modified independence Floor:  NT  RAMP: Modified independence  CURB: Modified independence  GAIT: Gait pattern: WFL Distance walked:  Assistive device utilized: Environmental consultant - 4 wheeled Level of assistance: Modified independence Comments:   FUNCTIONAL TESTS:  Timed up and  go (TUG): 23 sec w/ rollator Dynamic Gait Index: 13/24  M-CTSIB  Condition 1: Firm Surface, EO 30 Sec, Normal and Mild Sway  Condition 2: Firm Surface, EC 10 Sec, Moderate and Severe Sway  Condition 3: Foam Surface, EO 30 Sec, Normal Sway  Condition 4: Foam Surface, EC 4 Sec, Severe Sway        GOALS: Goals reviewed with patient? Yes  SHORT TERM GOALS: Target date: 11/15/2022    Patient will be independent in HEP to improve functional outcomes Baseline: Goal status: IN PROGRESS    LONG TERM GOALS: Target date: 11/29/2022    Exhibit improved C-spine ROM to 25 degrees lateral flexion and 30+ degrees rotation to  improve comfort and safety when driving Baseline:  Goal status: IN PROGRESS  2.  Report left shoulder pain not exceeding 3/10 with overhead movements to improve comfort with household activity and self-care tasks Baseline: 9/10 Goal status: IN PROGRESS  3.  Demo reduced risk for falls per time 15 sec TUG test Baseline: 23 sec w/ rollator Goal status: IN PROGRESS  4.  Improved postural stability per mild sway conditions 2 and 4 M-CTSIB x 15 sec to improve safety with ADL Baseline: severe Goal status: IN PROGRESS    ASSESSMENT:  CLINICAL IMPRESSION: Patient arrived to session with report of increased neck pain today without known cause. Denies N/T or weakness. After educating patient on dry needling benefits, precautions, and possible side effects and receiving verbal consent, patient tolerated DN to cervical musculature. Patient tolerated this very well and proceeded with MT to address remaining muscle tension. Patient remarked on improvement in pain to 2-3/10 compared to 5/10 at start of session and demonstrated improved cervical AROM upon leaving.   OBJECTIVE IMPAIRMENTS: decreased activity tolerance, decreased balance, decreased ROM, increased muscle spasms, impaired flexibility, and pain.   ACTIVITY LIMITATIONS: carrying, bending, sleeping, dressing, and  reach over head  PARTICIPATION LIMITATIONS: meal prep, cleaning, laundry, driving, shopping, and community activity  PERSONAL FACTORS: Time since onset of injury/illness/exacerbation and 1-2 comorbidities: knee OA  are also affecting patient's functional outcome.   REHAB POTENTIAL: Excellent  CLINICAL DECISION MAKING: Stable/uncomplicated  EVALUATION COMPLEXITY: Low   PLAN:  PT FREQUENCY: 1-2x/week  PT DURATION: 4 weeks  PLANNED INTERVENTIONS: Therapeutic exercises, Therapeutic activity, Neuromuscular re-education, Balance training, Gait training, Patient/Family education, Self Care, Joint mobilization, Stair training, Vestibular training, Canalith repositioning, DME instructions, Aquatic Therapy, Dry Needling, Electrical stimulation, Spinal mobilization, Cryotherapy, Moist heat, Traction, Ultrasound, Ionotophoresis 4mg /ml Dexamethasone, and Manual therapy  PLAN FOR NEXT SESSION: soft tissue work for neck and left scapula. Dry needling? Marland Kitchen Additional ROM/stretching for shoulder/neck. Balance activities as indicated   Anette Guarneri, [place], DPT 11/21/22 10:16 AM  [facility] at [facility] [address] [place], [address] [place], [place] [address] Phone # [phone] Fax # [phone]

## 2022-11-20 ENCOUNTER — Ambulatory Visit: Payer: Medicare Other | Admitting: Physical Therapy

## 2022-11-20 ENCOUNTER — Encounter: Payer: Medicare Other | Admitting: Physical Therapy

## 2022-11-20 DIAGNOSIS — G4733 Obstructive sleep apnea (adult) (pediatric): Secondary | ICD-10-CM | POA: Diagnosis not present

## 2022-11-20 DIAGNOSIS — J441 Chronic obstructive pulmonary disease with (acute) exacerbation: Secondary | ICD-10-CM | POA: Diagnosis not present

## 2022-11-21 ENCOUNTER — Encounter: Payer: Self-pay | Admitting: Physical Therapy

## 2022-11-21 ENCOUNTER — Ambulatory Visit: Payer: Medicare Other | Admitting: Physical Therapy

## 2022-11-21 ENCOUNTER — Telehealth: Payer: Self-pay | Admitting: Family Medicine

## 2022-11-21 DIAGNOSIS — R2681 Unsteadiness on feet: Secondary | ICD-10-CM | POA: Diagnosis not present

## 2022-11-21 DIAGNOSIS — M542 Cervicalgia: Secondary | ICD-10-CM | POA: Diagnosis not present

## 2022-11-21 DIAGNOSIS — R262 Difficulty in walking, not elsewhere classified: Secondary | ICD-10-CM | POA: Diagnosis not present

## 2022-11-21 DIAGNOSIS — R42 Dizziness and giddiness: Secondary | ICD-10-CM | POA: Diagnosis not present

## 2022-11-21 DIAGNOSIS — M6281 Muscle weakness (generalized): Secondary | ICD-10-CM

## 2022-11-21 MED ORDER — TRAMADOL HCL 50 MG PO TABS: 25.0000 mg | ORAL_TABLET | Freq: Four times a day (QID) | ORAL | 0 refills | Status: DC | PRN

## 2022-11-21 NOTE — Telephone Encounter (Signed)
Pt daughter/[name] notified via phone.

## 2022-11-21 NOTE — Patient Instructions (Signed)

## 2022-11-21 NOTE — Telephone Encounter (Signed)
Pt would like a refill on Tramadol to [facility] on [address].  Would like daughter to pickup this afternoon if possible.

## 2022-11-21 NOTE — Telephone Encounter (Signed)
Medication sent to pharmacy  

## 2022-11-25 ENCOUNTER — Ambulatory Visit: Payer: Medicare Other | Admitting: Family Medicine

## 2022-11-25 ENCOUNTER — Ambulatory Visit (INDEPENDENT_AMBULATORY_CARE_PROVIDER_SITE_OTHER): Payer: Medicare Other | Admitting: Internal Medicine

## 2022-11-25 ENCOUNTER — Encounter: Payer: Self-pay | Admitting: Internal Medicine

## 2022-11-25 VITALS — BP 130/78 | HR 72 | Temp 98.7°F | Ht 66.0 in | Wt 232.0 lb

## 2022-11-25 VITALS — BP 130/78 | HR 72 | Ht 66.0 in | Wt 232.0 lb

## 2022-11-25 DIAGNOSIS — E538 Deficiency of other specified B group vitamins: Secondary | ICD-10-CM | POA: Diagnosis not present

## 2022-11-25 DIAGNOSIS — E559 Vitamin D deficiency, unspecified: Secondary | ICD-10-CM | POA: Diagnosis not present

## 2022-11-25 DIAGNOSIS — I10 Essential (primary) hypertension: Secondary | ICD-10-CM | POA: Diagnosis not present

## 2022-11-25 DIAGNOSIS — M542 Cervicalgia: Secondary | ICD-10-CM

## 2022-11-25 DIAGNOSIS — E039 Hypothyroidism, unspecified: Secondary | ICD-10-CM

## 2022-11-25 DIAGNOSIS — R8290 Unspecified abnormal findings in urine: Secondary | ICD-10-CM | POA: Diagnosis not present

## 2022-11-25 DIAGNOSIS — G4733 Obstructive sleep apnea (adult) (pediatric): Secondary | ICD-10-CM | POA: Insufficient documentation

## 2022-11-25 DIAGNOSIS — E782 Mixed hyperlipidemia: Secondary | ICD-10-CM

## 2022-11-25 DIAGNOSIS — R7303 Prediabetes: Secondary | ICD-10-CM

## 2022-11-25 DIAGNOSIS — S060X0D Concussion without loss of consciousness, subsequent encounter: Secondary | ICD-10-CM

## 2022-11-25 LAB — HEPATIC FUNCTION PANEL
ALT: 14 U/L (ref 0–35)
AST: 21 U/L (ref 0–37)
Albumin: 3.9 g/dL (ref 3.5–5.2)
Alkaline Phosphatase: 74 U/L (ref 39–117)
Bilirubin, Direct: 0.2 mg/dL (ref 0.0–0.3)
Total Bilirubin: 1.3 mg/dL — ABNORMAL HIGH (ref 0.2–1.2)
Total Protein: 6.4 g/dL (ref 6.0–8.3)

## 2022-11-25 LAB — CBC WITH DIFFERENTIAL/PLATELET
Basophils Absolute: 0 10*3/uL (ref 0.0–0.1)
Basophils Relative: 0.4 % (ref 0.0–3.0)
Eosinophils Absolute: 0.2 10*3/uL (ref 0.0–0.7)
Eosinophils Relative: 2.9 % (ref 0.0–5.0)
HCT: 39.4 % (ref 36.0–46.0)
Hemoglobin: 12.8 g/dL (ref 12.0–15.0)
Lymphocytes Relative: 21.4 % (ref 12.0–46.0)
Lymphs Abs: 1.5 10*3/uL (ref 0.7–4.0)
MCHC: 32.5 g/dL (ref 30.0–36.0)
MCV: 100.8 fl — ABNORMAL HIGH (ref 78.0–100.0)
Monocytes Absolute: 0.7 10*3/uL (ref 0.1–1.0)
Monocytes Relative: 10.7 % (ref 3.0–12.0)
Neutro Abs: 4.5 10*3/uL (ref 1.4–7.7)
Neutrophils Relative %: 64.6 % (ref 43.0–77.0)
Platelets: 136 10*3/uL — ABNORMAL LOW (ref 150.0–400.0)
RBC: 3.91 Mil/uL (ref 3.87–5.11)
RDW: 15.4 % (ref 11.5–15.5)
WBC: 6.9 10*3/uL (ref 4.0–10.5)

## 2022-11-25 LAB — LIPID PANEL
Cholesterol: 198 mg/dL (ref 0–200)
HDL: 70.8 mg/dL (ref >39.00)
LDL Cholesterol: 113 mg/dL — ABNORMAL HIGH (ref 0–99)
NonHDL: 127.38
Total CHOL/HDL Ratio: 3
Triglycerides: 71 mg/dL (ref 0.0–149.0)
VLDL: 14.2 mg/dL (ref 0.0–40.0)

## 2022-11-25 LAB — BASIC METABOLIC PANEL
BUN: 19 mg/dL (ref 6–23)
CO2: 29 mEq/L (ref 19–32)
Calcium: 9.5 mg/dL (ref 8.4–10.5)
Chloride: 102 mEq/L (ref 96–112)
Creatinine, Ser: 0.9 mg/dL (ref 0.40–1.20)
GFR: 58.4 mL/min — ABNORMAL LOW (ref >60.00)
Glucose, Bld: 100 mg/dL — ABNORMAL HIGH (ref 70–99)
Potassium: 3.6 mEq/L (ref 3.5–5.1)
Sodium: 140 mEq/L (ref 135–145)

## 2022-11-25 LAB — HEMOGLOBIN A1C: Hgb A1c MFr Bld: 5.6 % (ref 4.6–6.5)

## 2022-11-25 LAB — TSH: TSH: 2.15 u[IU]/mL (ref 0.35–5.50)

## 2022-11-25 LAB — VITAMIN D 25 HYDROXY (VIT D DEFICIENCY, FRACTURES): VITD: 39.32 ng/mL (ref 30.00–100.00)

## 2022-11-25 LAB — VITAMIN B12: Vitamin B-12: 213 pg/mL (ref 211–911)

## 2022-11-25 MED ORDER — ALPRAZOLAM 0.5 MG PO TABS: ORAL_TABLET | ORAL | 2 refills | Status: DC

## 2022-11-25 MED ORDER — GABAPENTIN 300 MG PO CAPS: 300.0000 mg | ORAL_CAPSULE | Freq: Every day | ORAL | 1 refills | Status: DC

## 2022-11-25 MED ORDER — TRAMADOL HCL 50 MG PO TABS: 25.0000 mg | ORAL_TABLET | Freq: Four times a day (QID) | ORAL | 2 refills | Status: DC | PRN

## 2022-11-25 NOTE — Assessment & Plan Note (Signed)
Lab Results  Component Value Date   TSH 2.15 11/25/2022   Stable, pt to continue levothyroxine 75 mcg qd
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
## 2022-11-25 NOTE — Therapy (Signed)
OUTPATIENT PHYSICAL THERAPY VESTIBULAR TREATMENT     Patient Name: [name] MRN: [id number] DOB:Jan 30, 1936, 87 y.o., female Today's Date: 11/26/2022  END OF SESSION:  PT End of Session - 11/26/22 0930     Visit Number 6    Number of Visits 8    Date for PT Re-Evaluation 11/29/22    Authorization Type [employer]    Progress Note Due on Visit 10    PT Start Time [phone]    PT Stop Time 0930    PT Time Calculation (min) 39 min    Equipment Utilized During Treatment Gait belt    Activity Tolerance Patient tolerated treatment well    Behavior During Therapy WFL for tasks assessed/performed                Past Medical History:  Diagnosis Date   Allergy    Anxiety    Arthritis    no cartlidge in knees   Asthma    Bradycardia    a. atenolol stopped due to this, HR 40s.   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  CAD s/p RCA stent 2000 with residual mild-mod disease of left system 2001.   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   Osteoporosis 03/24/2015   Peripheral neuropathy 03/24/2015   Plantar fasciitis    Sleep apnea    Past Surgical History:  Procedure Laterality Date   BREAST LUMPECTOMY W/ NEEDLE LOCALIZATION  01/29/2011   Left with [facility] Dr Jamey Ripa   CHOLECYSTECTOMY  1955   CORONARY ANGIOPLASTY WITH STENT PLACEMENT  2000   DILATION AND CURETTAGE OF UTERUS  1976   and 1996   HAND SURGERY  1992   tendons between thumb and forefinger   SKIN BIOPSY     1994, 2006, 2008, 2010, 2011, 2011, 2012-  pre-cancerous   TONSILLECTOMY  1955   Patient Active Problem List   Diagnosis Date  Noted   Cloudy urine 11/25/2022   OSA (obstructive sleep apnea) 11/25/2022   Insomnia 07/27/2022   Vitamin D deficiency 07/27/2022   Pulmonary hypertension (HCC) 11/07/2021   Bronchiectasis without complication (HCC) 11/07/2021   Hepatic cirrhosis (HCC) 11/06/2021   ILD (interstitial lung disease) (HCC) 10/03/2021   Abnormal LFTs 09/28/2021   Weight loss 08/19/2021   Chronic respiratory failure with hypoxia (HCC) 08/19/2021   Debility 06/29/2021   Pneumonia 06/26/2021   Multifocal pneumonia 06/15/2021   Elevated troponin 06/12/2021   Febrile illness 05/22/2021   Anxiety state 10/23/2020   Pure hypercholesterolemia 10/23/2020   Urinary tract infectious disease 10/23/2020   Atherosclerosis of coronary artery 10/23/2020   Sinus node dysfunction (HCC) 10/23/2020   Mixed hyperlipidemia 10/23/2020   OSA on CPAP 10/23/2020   Fall 09/05/2020   Nausea 04/23/2020   Constipation 04/23/2020   Posterior uveitis, 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   Dysphagia 01/21/2020   CHF (congestive heart failure) (HCC) 11/23/2019   Exposure to COVID-19 virus 12/04/2018   Osteoarthritis 09/19/2018   Abscess of left leg 09/26/2017   Left leg cellulitis 09/18/2017   Whiplash 06/10/2017   Cough 05/29/2017  Concussion 05/29/2017   Hammer toes of both feet 12/19/2016   Allergic rhinitis 12/09/2016   Pedal edema 09/17/2016   Chest pain 07/18/2016   Gross hematuria 04/26/2016   Prediabetes 03/21/2016   Dysuria 12/07/2015   Fever blister 12/07/2015   Choroidal nevus, 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   Malignant neoplasm of left female breast (HCC) 10/10/2015   Anxiety and depression 06/30/2015   Encounter for well adult exam with abnormal findings 06/30/2015   Neck mass 03/24/2015   Hypothyroidism 03/24/2015   Gout 03/24/2015   Essential hypertension 03/24/2015   Asthma 03/24/2015   Peripheral neuropathy 03/24/2015   Osteoporosis 03/24/2015   Morbid obesity (HCC) 03/24/2015   Long term current use of anticoagulant  therapy 03/16/2015   Lymphadenopathy, cervical 03/16/2015   Diarrhea 03/16/2015   Hyperbilirubinemia 03/16/2015   Coronary arteriosclerosis 09/22/2014   Hyperlipidemia 07/23/2013   Hypokalemia 02/18/2013   Elevated bilirubin 02/18/2013   Morbid obesity with BMI of 45.0-49.9, adult (HCC) 10/29/2012   Rotator cuff tear arthropathy 10/29/2012   DJD (degenerative joint disease) of knee 10/29/2012   Plantar fasciitis    Arthritis    Hx of radiation therapy    Atrial fibrillation, chronic (HCC) 01/21/2011   Malignant neoplasm of upper-outer quadrant of left breast in female, estrogen receptor positive (HCC) 12/26/2010   Breast cancer (ILC) Receptor + her2 _ 12/26/2010    PCP: [name], MD REFERRING PROVIDER: Rodolph Bong, MD  REFERRING DIAG:  S06.0X0D (ICD-10-CM) - Concussion without loss of consciousness, subsequent encounter    THERAPY DIAG:  Dizziness and giddiness  Unsteadiness on feet  Muscle weakness (generalized)  Difficulty in walking, not elsewhere classified  ONSET DATE: 10/15/22  Rationale for Evaluation and Treatment: Rehabilitation  SUBJECTIVE:   SUBJECTIVE STATEMENT: The neck is feeling "great" since the DN.   Pt accompanied by: self  PERTINENT HISTORY: [name],  is a 87 y.o. female who presents for initial evaluation of a head injury and multiple contusions. MOI: She fell/rolled out of bed due to her nightgown being tangled in the bedding. She was transported via EMS to [facility] ED and dx w/ a levator scapula hematoma. Today, pt c/o cont'd confusion, soreness in her neck, shoulders, and thoracic spine. Her daughter notes a "bump" on the back of pt's head.   PAIN:  Are you having pain? Yes: NPRS scale: 6/10 Pain location: "the whole neck" Pain description: ache Aggravating factors: activity, overhead movements  Relieving factors: extra strength tylenol  PRECAUTIONS: None  WEIGHT BEARING RESTRICTIONS: No  FALLS: Has patient fallen in  last 6 months? Yes. Number of falls 1  LIVING ENVIRONMENT: Lives with: lives alone Lives in: House/apartment Stairs: Yes: Internal: 2-3 steps; can reach both and typically uses rollator , ramp for entering/exiting home Has following equipment at home: Dan Humphreys - 4 wheeled  PLOF: Independent with household mobility with device and Independent with community mobility with device  PATIENT GOALS: reduce pain  OBJECTIVE:    TODAY'S TREATMENT: 11/26/22 Activity Comments  Cervical rotation SNAG 10x to tolerance each side Assist in hand placement ; some trunk compensation   cervical retraction 10x3"  Good form and tolerance   cervical retraction + neck rotation 5x each side  Cueing to perform 50% ROM with chin tuck   romberg EO/EC 2x30" each Required fingertip support with EC d/t imbalance   romberg + head turns/nods 30" each  Mild sway  Skilled palpation and monitoring of tissues during TPDN Soft tissue restriction and tender  trigger points in R cervical paraspinals and UT             Trigger Point Dry-Needling  Treatment instructions: Expect mild to moderate muscle soreness. S/S of pneumothorax if dry needled over a lung field, and to seek immediate medical attention should they occur. Patient verbalized understanding of these instructions and education.  Patient Consent Given: Yes Education handout provided: Yes Muscles treated: R cervical multifidi and R UT using standard technique  Electrical stimulation performed: No Parameters: N/A Treatment response/outcome: pt noted good improvement in neck mobility     HOME EXERCISE PROGRAM Access Code: [email or web address] URL: [email or web address] Date: 11/26/2022 Prepared by: [facility] - Outpatient  Rehab - [facility]  Exercises - Seated Cervical Sidebending Stretch  - 1 x daily - 7 x weekly - 3 sets - 30-60 sec hold - Seated Upper Trap Stretch  - 1 x daily - 7 x weekly - 3 sets - 30-60 sec hold - Cervical Retraction with  Overpressure  - 1 x daily - 5 x weekly - 2 sets - 10 reps - 3 sec hold - Seated Assisted Cervical Rotation with Towel  - 1 x daily - 5 x weekly - 2 sets - 10 reps - Mid-Lower Cervical Extension SNAG with Strap  - 1 x daily - 5 x weekly - 2 sets - 10 reps - Romberg Stance with Eyes Closed  - 1 x daily - 5 x weekly - 2 sets - 30 sec hold - Romberg Stance with Head Nods  - 1 x daily - 5 x weekly - 2 sets - 30 sec hold   PATIENT EDUCATION: Education details: HEP update- to be performed in corner or counter for safety Person educated: Patient Education method: Explanation, Demonstration, Tactile cues, Verbal cues, and Handouts Education comprehension: verbalized understanding and returned demonstration     Below measures were taken at time of initial evaluation unless otherwise specified:   DIAGNOSTIC FINDINGS:  IMPRESSION: No acute bony abnormality. Degenerative disc and facet disease throughout the cervical spine.   Enlargement of the left levator scapulae muscle with surrounding stranding most compatible with intramuscular hematoma.  COGNITION: Overall cognitive status: Within functional limits for tasks assessed   SENSATION: WNL  EDEMA:    MUSCLE TONE:  WNL   POSTURE:  No Significant postural limitations  Cervical ROM:    Active A/PROM (deg) eval  Flexion 50  Extension 40  Right lateral flexion 15  Left lateral flexion 15  Right rotation 15  Left rotation 20  (Blank rows = not tested)  UE ROM: WFL--able to elevate arms overhead full ROM but with discomfort in LUE  STRENGTH: 5/5 gross BUE  PALPATION:  multiple areas of tenderness/trigger points throughout left shoulder girdle and neck--more soft tissue than joint discomfort of the area  BED MOBILITY:  independent  TRANSFERS: Assistive device utilized: Environmental consultant - 4 wheeled  Sit to stand: Modified independence Stand to sit: Modified independence Chair to chair: Modified independence Floor:  NT  RAMP:  Modified independence  CURB: Modified independence  GAIT: Gait pattern: WFL Distance walked:  Assistive device utilized: Environmental consultant - 4 wheeled Level of assistance: Modified independence Comments:   FUNCTIONAL TESTS:  Timed up and go (TUG): 23 sec w/ rollator Dynamic Gait Index: 13/24  M-CTSIB  Condition 1: Firm Surface, EO 30 Sec, Normal and Mild Sway  Condition 2: Firm Surface, EC 10 Sec, Moderate and Severe Sway  Condition 3: Foam Surface, EO 30 Sec, Normal Sway  Condition 4:  Foam Surface, EC 4 Sec, Severe Sway        GOALS: Goals reviewed with patient? Yes  SHORT TERM GOALS: Target date: 11/15/2022    Patient will be independent in HEP to improve functional outcomes Baseline: Goal status: IN PROGRESS    LONG TERM GOALS: Target date: 11/29/2022    Exhibit improved C-spine ROM to 25 degrees lateral flexion and 30+ degrees rotation to improve comfort and safety when driving Baseline:  Goal status: IN PROGRESS  2.  Report left shoulder pain not exceeding 3/10 with overhead movements to improve comfort with household activity and self-care tasks Baseline: 9/10 Goal status: IN PROGRESS  3.  Demo reduced risk for falls per time 15 sec TUG test Baseline: 23 sec w/ rollator Goal status: IN PROGRESS  4.  Improved postural stability per mild sway conditions 2 and 4 M-CTSIB x 15 sec to improve safety with ADL Baseline: severe Goal status: IN PROGRESS    ASSESSMENT:  CLINICAL IMPRESSION: Patient arrived to session with report of good benefit from DN last session. Worked on postural strengthening and cervical stretching to tolerance. Worked on Company secretary activities with UE support. Patient demonstrated significant challenge with EC balance and mild sway with Romberg with head movements. After receiving verbal consent, patient tolerated DN to R cervical musculature and remarked on improved cervical ROM after treatment. No complaints upon leaving.   OBJECTIVE  IMPAIRMENTS: decreased activity tolerance, decreased balance, decreased ROM, increased muscle spasms, impaired flexibility, and pain.   ACTIVITY LIMITATIONS: carrying, bending, sleeping, dressing, and reach over head  PARTICIPATION LIMITATIONS: meal prep, cleaning, laundry, driving, shopping, and community activity  PERSONAL FACTORS: Time since onset of injury/illness/exacerbation and 1-2 comorbidities: knee OA  are also affecting patient's functional outcome.   REHAB POTENTIAL: Excellent  CLINICAL DECISION MAKING: Stable/uncomplicated  EVALUATION COMPLEXITY: Low   PLAN:  PT FREQUENCY: 1-2x/week  PT DURATION: 4 weeks  PLANNED INTERVENTIONS: Therapeutic exercises, Therapeutic activity, Neuromuscular re-education, Balance training, Gait training, Patient/Family education, Self Care, Joint mobilization, Stair training, Vestibular training, Canalith repositioning, DME instructions, Aquatic Therapy, Dry Needling, Electrical stimulation, Spinal mobilization, Cryotherapy, Moist heat, Traction, Ultrasound, Ionotophoresis 4mg /ml Dexamethasone, and Manual therapy  PLAN FOR NEXT SESSION: soft tissue work for neck and left scapula. Dry needling? Marland Kitchen Additional ROM/stretching for shoulder/neck. Balance activities as indicated   Anette Guarneri, [place], DPT 11/26/22 9:31 AM  [facility] at [facility] [address] [place], [address] [place], [place] [address] Phone # [phone] Fax # [phone]

## 2022-11-25 NOTE — Progress Notes (Signed)
Subjective:   I, Mercedes Riser, PhD, LAT, ATC acting as a scribe for Mercedes Graham, MD.  Chief Complaint: [name],  is a 87 y.o. female who presents for f/u concussion. On June 11th, she fell/rolled out of bed due to her nightgown being tangled in the bedding. She was transported via EMS to the [facility] ED. Pt was last seen by Dr. [name] on 10/24/22 and was advised to use a heating pad, TENS unit, and was referred to vestibular therapy.  Today, pt reports she feels much. Pt c/o cont'd neck, mid-back, and head pain. She feels like most cognitive and vestibular therapies are much improved. She notes much benefit from her PT visits. She has some questions about taking the tramadol and Tylenol  Injury date: 10/15/22 Visit #: 2  History of Present Illness:   Concussion Self-Reported Symptom Score Symptoms rated on a scale 1-6, in last 24 hours  Headache: 2   Pressure in head: 0 Neck pain: 6 Nausea or vomiting: 0 Dizziness: 0  Blurred vision: 0  Balance problems: 0 Sensitivity to light:  0 Sensitivity to noise: 0 Feeling slowed down: 1 Feeling like "in a fog": 0 "Don't feel right": 3 Difficulty concentrating: 0 Difficulty remembering: 0 Fatigue or low energy: 1 Confusion: 0 Drowsiness: 0 More emotional: 0 Irritability: 0 Sadness: 0 Nervous or anxious: 0 Trouble falling asleep: 0   Total # of Symptoms: 5/22 Total Symptom Score: 13/132  Previous Total # of Symptoms: 12/22 Previous Symptom Score: 53/132  Tinnitus: pre-existing  Review of Systems: No fevers or illness    Review of History: Heart disease  Objective:    Physical Examination Vitals:   11/25/22 0944  BP: 130/78  Pulse: 72  SpO2: 99%   MSK: C-spine: Normal appearing. Tender palpation cervical paraspinal musculature. Decreased cervical motion. Upper extremity strength is intact. Neuro: Alert and oriented.  Normal coordination.  Uses a walker for gait. Psych: Normal speech thought process and  affect.      Assessment and Plan   87 y.o. female with concussion overall improving.  She also has neck pain resulting from irritation of existing DJD and injury to the cervical muscle groups.  She has been using vestibular physical therapy and more conventional physical therapy and has had significant improvement following dry needling and home exercise program.  She has more physical therapy scheduled until the end of July.  Recommend continue PT and home exercise program and check back with me in 1 month.  We talked about safe doses of medication.  She takes tramadol prescribed by her PCP.  Recommend adding some Tylenol and try and reduce the total dose of tramadol if possible.    Recheck 1 month    Action/Discussion: Reviewed diagnosis, management options, expected outcomes, and the reasons for scheduled and emergent follow-up. Questions were adequately answered. Patient expressed verbal understanding and agreement with the following plan.     Patient Education: Reviewed with patient the risks (i.e, a repeat concussion, post-concussion syndrome, second-impact syndrome) of returning to play prior to complete resolution, and thoroughly reviewed the signs and symptoms of concussion.Reviewed need for complete resolution of all symptoms, with rest AND exertion, prior to return to play. Reviewed red flags for urgent medical evaluation: worsening symptoms, nausea/vomiting, intractable headache, musculoskeletal changes, focal neurological deficits. [facility]'s Concussion Care Plan, which clearly outlines the plans stated above, was given to patient.   Level of service: Total encounter time 30 minutes including face-to-face time with the patient  and, reviewing past medical record, and charting on the date of service.       After Visit Summary printed out and provided to patient as appropriate.  The above documentation has been reviewed and is accurate and complete [name]

## 2022-11-25 NOTE — Patient Instructions (Addendum)
Thank you for coming in today.   Use tramadol sparingly if able.   Ok to take with tylenol arthritis.   Keep working with PT and home exercises.   Try using a heating pad.   Recheck in 1 month.   Let me know if you need a re-order of PT.

## 2022-11-25 NOTE — Assessment & Plan Note (Signed)
Pt for f/u referral as prior provider now retired

## 2022-11-25 NOTE — Assessment & Plan Note (Signed)
Lab Results  Component Value Date   LDLCALC 113 (H) 11/25/2022   Uncontrolled,, pt to continue current statin crestor 10 every day and zetia 10 every day as delcines change for now

## 2022-11-25 NOTE — Patient Instructions (Signed)
Please continue all other medications as before, and refills have been done for the xanax and tramadol  Please have the pharmacy call with any other refills you may need.  Please continue your efforts at being more active, low cholesterol diet, and weight control.  Please keep your appointments with your specialists as you may have planned - sports medicine later today, and please see [facility] for the hearing aids OTC  You will be contacted regarding the referral for: Pulmonary - for the sleep apnea follow up  Please go to the LAB at the blood drawing area for the tests to be done  You will be contacted by phone if any changes need to be made immediately.  Otherwise, you will receive a letter about your results with an explanation, but please check with MyChart first.  Please remember to sign up for MyChart if you have not done so, as this will be important to you in the future with finding out test results, communicating by private email, and scheduling acute appointments online when needed.  Please make an Appointment to return in 4 months, or sooner if needed

## 2022-11-25 NOTE — Progress Notes (Signed)
The test results show that your current treatment is OK, as the tests are stable.  Please continue the same plan.  There is no other need for change of treatment or further evaluation based on these results, at this time.  thanks 

## 2022-11-25 NOTE — Assessment & Plan Note (Signed)
Cant r/o uti - for ua and culture 

## 2022-11-25 NOTE — Assessment & Plan Note (Signed)
Lab Results  Component Value Date   HGBA1C 5.6 11/25/2022   Stable, pt to continue current medical treatment  - diet, wt control

## 2022-11-25 NOTE — Assessment & Plan Note (Signed)
BP Readings from Last 3 Encounters:  11/25/22 130/78  11/25/22 130/78  10/24/22 128/78   Stable, pt to continue medical treatment hct 50 every day, toprl xl 25 qd

## 2022-11-26 ENCOUNTER — Ambulatory Visit: Payer: Medicare Other | Admitting: Physical Therapy

## 2022-11-26 ENCOUNTER — Encounter: Payer: Self-pay | Admitting: Physical Therapy

## 2022-11-26 DIAGNOSIS — R42 Dizziness and giddiness: Secondary | ICD-10-CM | POA: Diagnosis not present

## 2022-11-26 DIAGNOSIS — R262 Difficulty in walking, not elsewhere classified: Secondary | ICD-10-CM | POA: Diagnosis not present

## 2022-11-26 DIAGNOSIS — M6281 Muscle weakness (generalized): Secondary | ICD-10-CM

## 2022-11-26 DIAGNOSIS — M542 Cervicalgia: Secondary | ICD-10-CM | POA: Diagnosis not present

## 2022-11-26 DIAGNOSIS — R2681 Unsteadiness on feet: Secondary | ICD-10-CM

## 2022-11-27 ENCOUNTER — Other Ambulatory Visit: Payer: Self-pay | Admitting: Internal Medicine

## 2022-11-27 LAB — URINE CULTURE
MICRO NUMBER:: 15229060
SPECIMEN QUALITY:: ADEQUATE

## 2022-11-27 MED ORDER — CIPROFLOXACIN HCL 500 MG PO TABS: 500.0000 mg | ORAL_TABLET | Freq: Two times a day (BID) | ORAL | 0 refills | Status: AC

## 2022-11-28 ENCOUNTER — Ambulatory Visit: Payer: Medicare Other

## 2022-11-29 ENCOUNTER — Ambulatory Visit: Payer: Medicare Other | Admitting: Physical Therapy

## 2022-11-29 ENCOUNTER — Encounter: Payer: Self-pay | Admitting: Physical Therapy

## 2022-11-29 DIAGNOSIS — R262 Difficulty in walking, not elsewhere classified: Secondary | ICD-10-CM | POA: Diagnosis not present

## 2022-11-29 DIAGNOSIS — R42 Dizziness and giddiness: Secondary | ICD-10-CM | POA: Diagnosis not present

## 2022-11-29 DIAGNOSIS — R2681 Unsteadiness on feet: Secondary | ICD-10-CM

## 2022-11-29 DIAGNOSIS — M6281 Muscle weakness (generalized): Secondary | ICD-10-CM

## 2022-11-29 DIAGNOSIS — M542 Cervicalgia: Secondary | ICD-10-CM | POA: Diagnosis not present

## 2022-11-29 NOTE — Therapy (Signed)
OUTPATIENT PHYSICAL THERAPY VESTIBULAR TREATMENT     Patient Name: [name] MRN: [id number] DOB:[birth year], 87 y.o., female Today's Date: 11/29/2022  END OF SESSION:  PT End of Session - 11/29/22 1023     Visit Number 7    Number of Visits 8    Date for PT Re-Evaluation 11/29/22    Authorization Type [employer]    Progress Note Due on Visit 10    PT Start Time 1021    PT Stop Time 1100    PT Time Calculation (min) 39 min    Equipment Utilized During Treatment --    Activity Tolerance Patient tolerated treatment well    Behavior During Therapy WFL for tasks assessed/performed                 Past Medical History:  Diagnosis Date   Allergy    Anxiety    Arthritis    no cartlidge in knees   Asthma    Bradycardia    a. atenolol stopped due to this, HR 40s.   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  CAD s/p RCA stent 2000 with residual mild-mod disease of left system 2001.   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   Osteoporosis 03/24/2015   Peripheral neuropathy 03/24/2015   Plantar fasciitis    Sleep apnea    Past Surgical History:  Procedure Laterality Date   BREAST LUMPECTOMY W/ NEEDLE LOCALIZATION  01/29/2011   Left with SL[facility] Dr Jamey Ripa   CHOLECYSTECTOMY  1955   CORONARY ANGIOPLASTY WITH STENT PLACEMENT  2000   DILATION AND CURETTAGE OF UTERUS  1976   and 1996   HAND SURGERY  1992   tendons between thumb and forefinger   SKIN BIOPSY     1994, 2006, 2008, 2010, 2011, 2011, 2012-  pre-cancerous   TONSILLECTOMY  1955   Patient Active Problem List   Diagnosis Date Noted    Cloudy urine 11/25/2022   OSA (obstructive sleep apnea) 11/25/2022   Insomnia 07/27/2022   Vitamin D deficiency 07/27/2022   Pulmonary hypertension (HCC) 11/07/2021   Bronchiectasis without complication (HCC) 11/07/2021   Hepatic cirrhosis (HCC) 11/06/2021   ILD (interstitial lung disease) (HCC) 10/03/2021   Abnormal LFTs 09/28/2021   Weight loss 08/19/2021   Chronic respiratory failure with hypoxia (HCC) 08/19/2021   Debility 06/29/2021   Pneumonia 06/26/2021   Multifocal pneumonia 06/15/2021   Elevated troponin 06/12/2021   Febrile illness 05/22/2021   Anxiety state 10/23/2020   Pure hypercholesterolemia 10/23/2020   Urinary tract infectious disease 10/23/2020   Atherosclerosis of coronary artery 10/23/2020   Sinus node dysfunction (HCC) 10/23/2020   Mixed hyperlipidemia 10/23/2020   OSA on CPAP 10/23/2020   Fall 09/05/2020   Nausea 04/23/2020   Constipation 04/23/2020   Posterior uveitis, 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   Dysphagia 01/21/2020   CHF (congestive heart failure) (HCC) 11/23/2019   Exposure to COVID-19 virus 12/04/2018   Osteoarthritis 09/19/2018   Abscess of left leg 09/26/2017   Left leg cellulitis 09/18/2017   Whiplash 06/10/2017   Cough 05/29/2017  Concussion 05/29/2017   Hammer toes of both feet 12/19/2016   Allergic rhinitis 12/09/2016   Pedal edema 09/17/2016   Chest pain 07/18/2016   Gross hematuria 04/26/2016   Prediabetes 03/21/2016   Dysuria 12/07/2015   Fever blister 12/07/2015   Choroidal nevus, 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   Malignant neoplasm of left female breast (HCC) 10/10/2015   Anxiety and depression 06/30/2015   Encounter for well adult exam with abnormal findings 06/30/2015   Neck mass 03/24/2015   Hypothyroidism 03/24/2015   Gout 03/24/2015   Essential hypertension 03/24/2015   Asthma 03/24/2015   Peripheral neuropathy 03/24/2015   Osteoporosis 03/24/2015   Morbid obesity (HCC) 03/24/2015   Long term current use of anticoagulant therapy  03/16/2015   Lymphadenopathy, cervical 03/16/2015   Diarrhea 03/16/2015   Hyperbilirubinemia 03/16/2015   Coronary arteriosclerosis 09/22/2014   Hyperlipidemia 07/23/2013   Hypokalemia 02/18/2013   Elevated bilirubin 02/18/2013   Morbid obesity with BMI of 45.0-49.9, adult (HCC) 10/29/2012   Rotator cuff tear arthropathy 10/29/2012   DJD (degenerative joint disease) of knee 10/29/2012   Plantar fasciitis    Arthritis    Hx of radiation therapy    Atrial fibrillation, chronic (HCC) 01/21/2011   Malignant neoplasm of upper-outer quadrant of left breast in female, estrogen receptor positive (HCC) 12/26/2010   Breast cancer (ILC) Receptor + her2 _ 12/26/2010    PCP: [name], MD REFERRING PROVIDER: Rodolph Bong, MD  REFERRING DIAG:  S06.0X0D (ICD-10-CM) - Concussion without loss of consciousness, subsequent encounter    THERAPY DIAG:  Muscle weakness (generalized)  Unsteadiness on feet  ONSET DATE: 10/15/22  Rationale for Evaluation and Treatment: Rehabilitation  SUBJECTIVE:   SUBJECTIVE STATEMENT: Feels like my head is pulling forward and my neck is really tight today upon getting up from bed.  It hasn't been so stiff since the dry needling.  Fell asleep last night in the recliner and didn't get into the bed until 5 am.    Pt accompanied by: self  PERTINENT HISTORY: [name],  is a 87 y.o. female who presents for initial evaluation of a head injury and multiple contusions. MOI: She fell/rolled out of bed due to her nightgown being tangled in the bedding. She was transported via EMS to the [facility] ED and dx w/ a levator scapula hematoma. Today, pt c/o cont'd confusion, soreness in her neck, shoulders, and thoracic spine. Her daughter notes a "bump" on the back of pt's head.   PAIN:  Are you having pain? Yes: NPRS scale: 7-8/10 Pain location: "the whole neck" Pain description: ache Aggravating factors: worse today after stiffness in the bed today; activity,  overhead movements  Relieving factors: extra strength tylenol  PRECAUTIONS: None  WEIGHT BEARING RESTRICTIONS: No  FALLS: Has patient fallen in last 6 months? Yes. Number of falls 1  LIVING ENVIRONMENT: Lives with: lives alone Lives in: House/apartment Stairs: Yes: Internal: 2-3 steps; can reach both and typically uses rollator , ramp for entering/exiting home Has following equipment at home: Dan Humphreys - 4 wheeled  PLOF: Independent with household mobility with device and Independent with community mobility with device  PATIENT GOALS: reduce pain  OBJECTIVE:    TODAY'S TREATMENT: 11/29/2022 Activity Comments  STM and trigger point release to UT (supine and seated) and suboccipital (supine)   Suboccipital release Pt feels less tight, improved symptoms  Supine chin tucks/cervical retraction, 2 x 5 reps 50% full range -pt feels relief  Seated chin tuck/cervical retraction 2 x 5 reps  Seated neck rotation 50% full range, cues for neck rotation with optimal chin retraction  Shoulder rolls Forward and back, 5 reps each  Rates pain at end of session 0-3/10  Access Code: [email or web address] URL: [email or web address] Date: 11/29/2022 Prepared by: [facility] Outpatient  Rehab - [facility]  Exercises - Seated Cervical Sidebending Stretch  - 1 x daily - 7 x weekly - 3 sets - 30-60 sec hold - Seated Upper Trap Stretch  - 1 x daily - 7 x weekly - 3 sets - 30-60 sec hold - Cervical Retraction with Overpressure  - 1 x daily - 5 x weekly - 2 sets - 10 reps - 3 sec hold - Seated Assisted Cervical Rotation with Towel  - 1 x daily - 5 x weekly - 2 sets - 10 reps - Mid-Lower Cervical Extension SNAG with Strap  - 1 x daily - 5 x weekly - 2 sets - 10 reps - Romberg Stance with Eyes Closed  - 1 x daily - 5 x weekly - 2 sets - 30 sec hold - Romberg Stance with Head Nods  - 1 x daily - 5 x weekly - 2 sets - 30 sec hold - Supine Cervical Retraction with Towel  - 1 x daily - 7 x weekly - 2 sets  - 5 reps - 3 sec hold      PATIENT EDUCATION: Education details: HEP update- supine chin tuck Person educated: Patient Education method: Explanation, Demonstration, Tactile cues, Verbal cues, and Handouts Education comprehension: verbalized understanding and returned demonstration     Below measures were taken at time of initial evaluation unless otherwise specified:   DIAGNOSTIC FINDINGS:  IMPRESSION: No acute bony abnormality. Degenerative disc and facet disease throughout the cervical spine.   Enlargement of the left levator scapulae muscle with surrounding stranding most compatible with intramuscular hematoma.  COGNITION: Overall cognitive status: Within functional limits for tasks assessed   SENSATION: WNL  EDEMA:    MUSCLE TONE:  WNL   POSTURE:  No Significant postural limitations  Cervical ROM:    Active A/PROM (deg) eval  Flexion 50  Extension 40  Right lateral flexion 15  Left lateral flexion 15  Right rotation 15  Left rotation 20  (Blank rows = not tested)  UE ROM: WFL--able to elevate arms overhead full ROM but with discomfort in LUE  STRENGTH: 5/5 gross BUE  PALPATION:  multiple areas of tenderness/trigger points throughout left shoulder girdle and neck--more soft tissue than joint discomfort of the area  BED MOBILITY:  independent  TRANSFERS: Assistive device utilized: Environmental consultant - 4 wheeled  Sit to stand: Modified independence Stand to sit: Modified independence Chair to chair: Modified independence Floor:  NT  RAMP: Modified independence  CURB: Modified independence  GAIT: Gait pattern: WFL Distance walked:  Assistive device utilized: Environmental consultant - 4 wheeled Level of assistance: Modified independence Comments:   FUNCTIONAL TESTS:  Timed up and go (TUG): 23 sec w/ rollator Dynamic Gait Index: 13/24  M-CTSIB  Condition 1: Firm Surface, EO 30 Sec, Normal and Mild Sway  Condition 2: Firm Surface, EC 10 Sec, Moderate and Severe  Sway  Condition 3: Foam Surface, EO 30 Sec, Normal Sway  Condition 4: Foam Surface, EC 4 Sec, Severe Sway        GOALS: Goals reviewed with patient? Yes  SHORT TERM GOALS: Target date: 11/15/2022    Patient will be independent in HEP to improve functional outcomes Baseline: Goal status: IN PROGRESS  LONG TERM GOALS: Target date: 11/29/2022    Exhibit improved C-spine ROM to 25 degrees lateral flexion and 30+ degrees rotation to improve comfort and safety when driving Baseline:  Goal status: IN PROGRESS  2.  Report left shoulder pain not exceeding 3/10 with overhead movements to improve comfort with household activity and self-care tasks Baseline: 9/10 Goal status: IN PROGRESS  3.  Demo reduced risk for falls per time 15 sec TUG test Baseline: 23 sec w/ rollator Goal status: IN PROGRESS  4.  Improved postural stability per mild sway conditions 2 and 4 M-CTSIB x 15 sec to improve safety with ADL Baseline: severe Goal status: IN PROGRESS    ASSESSMENT:  CLINICAL IMPRESSION: Pt arrived today with reports of increased stiffness and pain in neck today, onset since this morning upon waking up.  She did fall asleep in the recliner and didn't move into her bed until 5 am, so this positioning may have contributed to increased pain.  She does present with increased forward head posture and reports of stiffness.  Worked on [employer] and suboccipital release, with pt reporting relief throughout session.  Pt performed supine chin tuck/neck retraction to tolerance in supine and sitting, to continue to reinforce less forward head posture.  Pt overall reports 0-3/10 pain at end of session and is pleased with how she feels.  She would benefit from continued skilled PT towards goals for imrpoved pain and posture and balance.  OBJECTIVE IMPAIRMENTS: decreased activity tolerance, decreased balance, decreased ROM, increased muscle spasms, impaired flexibility, and pain.   ACTIVITY LIMITATIONS:  carrying, bending, sleeping, dressing, and reach over head  PARTICIPATION LIMITATIONS: meal prep, cleaning, laundry, driving, shopping, and community activity  PERSONAL FACTORS: Time since onset of injury/illness/exacerbation and 1-2 comorbidities: knee OA  are also affecting patient's functional outcome.   REHAB POTENTIAL: Excellent  CLINICAL DECISION MAKING: Stable/uncomplicated  EVALUATION COMPLEXITY: Low   PLAN:  PT FREQUENCY: 1-2x/week  PT DURATION: 4 weeks  PLANNED INTERVENTIONS: Therapeutic exercises, Therapeutic activity, Neuromuscular re-education, Balance training, Gait training, Patient/Family education, Self Care, Joint mobilization, Stair training, Vestibular training, Canalith repositioning, DME instructions, Aquatic Therapy, Dry Needling, Electrical stimulation, Spinal mobilization, Cryotherapy, Moist heat, Traction, Ultrasound, Ionotophoresis 4mg /ml Dexamethasone, and Manual therapy  PLAN FOR NEXT SESSION: NEEDS RECERT; soft tissue work for neck and left scapula. Dry needling? Marland Kitchen Additional ROM/stretching for shoulder/neck. Balance activities as indicated   Lonia Blood, PT 11/29/22 12:16 PM Phone: [phone] Fax: [phone]   [facility] at [facility] [address] [place], [place] [address] Phone # [phone] Fax # [phone]

## 2022-12-02 ENCOUNTER — Ambulatory Visit: Payer: Medicare Other

## 2022-12-03 ENCOUNTER — Telehealth: Payer: Self-pay | Admitting: Family Medicine

## 2022-12-03 ENCOUNTER — Ambulatory Visit: Payer: Medicare Other

## 2022-12-03 DIAGNOSIS — M6281 Muscle weakness (generalized): Secondary | ICD-10-CM | POA: Diagnosis not present

## 2022-12-03 DIAGNOSIS — R2681 Unsteadiness on feet: Secondary | ICD-10-CM

## 2022-12-03 DIAGNOSIS — M542 Cervicalgia: Secondary | ICD-10-CM

## 2022-12-03 DIAGNOSIS — R262 Difficulty in walking, not elsewhere classified: Secondary | ICD-10-CM

## 2022-12-03 DIAGNOSIS — R42 Dizziness and giddiness: Secondary | ICD-10-CM | POA: Diagnosis not present

## 2022-12-03 NOTE — Telephone Encounter (Signed)
Patient called stating that she saw Dr Katrinka Blazing a couple years ago in regards to a concussion. She has had a new concussion and was seen by Dr [name] and has been doing therapy. She said that she does not seem to be getting better a quickly as she did the last time and wanted to talk to Dr Katrinka Blazing about it.  I advised that due to Dr Michaelle Copas schedule, he is not able to see concussion patients. She understood but asked if she could talk to Dr Katrinka Blazing or his assistant in regards.  Please advise.

## 2022-12-03 NOTE — Therapy (Signed)
OUTPATIENT PHYSICAL THERAPY VESTIBULAR TREATMENT, Progress Note, and Recertification     Patient Name: [name] MRN: [id number] DOB:[birth year], 87 y.o., female Today's Date: 12/03/2022 Progress Note Reporting Period 11/01/22 to 12/03/22  See note below for Objective Data and Assessment of Progress/Goals.     END OF SESSION:  PT End of Session - 12/03/22 1619     Visit Number 8    Number of Visits 16    Date for PT Re-Evaluation 12/31/22    Authorization Type [employer]    Progress Note Due on Visit 18    PT Start Time 1615    PT Stop Time 1700    PT Time Calculation (min) 45 min    Activity Tolerance Patient tolerated treatment well    Behavior During Therapy WFL for tasks assessed/performed                 Past Medical History:  Diagnosis Date   Allergy    Anxiety    Arthritis    no cartlidge in knees   Asthma    Bradycardia    a. atenolol stopped due to this, HR 40s.   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  CAD s/p RCA stent 2000 with residual mild-mod disease of left system 2001.   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   Osteoporosis 03/24/2015   Peripheral neuropathy 03/24/2015   Plantar fasciitis    Sleep apnea    Past Surgical History:  Procedure Laterality Date   BREAST LUMPECTOMY W/ NEEDLE LOCALIZATION  01/29/2011   Left with [facility] Dr [name]   CHOLECYSTECTOMY  1955   CORONARY ANGIOPLASTY WITH STENT PLACEMENT  2000   DILATION AND CURETTAGE OF UTERUS  1976   and 1996   HAND SURGERY  1992   tendons between thumb and forefinger   SKIN BIOPSY     1994, 2006, 2008,  2010, 2011, 2011, 2012-  pre-cancerous   TONSILLECTOMY  1955   Patient Active Problem List   Diagnosis Date Noted   Cloudy urine 11/25/2022   OSA (obstructive sleep apnea) 11/25/2022   Insomnia 07/27/2022   Vitamin D deficiency 07/27/2022   Pulmonary hypertension (HCC) 11/07/2021   Bronchiectasis without complication (HCC) 11/07/2021   Hepatic cirrhosis (HCC) 11/06/2021   ILD (interstitial lung disease) (HCC) 10/03/2021   Abnormal LFTs 09/28/2021   Weight loss 08/19/2021   Chronic respiratory failure with hypoxia (HCC) 08/19/2021   Debility 06/29/2021   Pneumonia 06/26/2021   Multifocal pneumonia 06/15/2021   Elevated troponin 06/12/2021   Febrile illness 05/22/2021   Anxiety state 10/23/2020   Pure hypercholesterolemia 10/23/2020   Urinary tract infectious disease 10/23/2020   Atherosclerosis of coronary artery 10/23/2020   Sinus node dysfunction (HCC) 10/23/2020   Mixed hyperlipidemia 10/23/2020   OSA on CPAP 10/23/2020   Fall 09/05/2020   Nausea 04/23/2020   Constipation 04/23/2020   Posterior uveitis, 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   Dysphagia 01/21/2020   CHF (congestive heart failure) (HCC) 11/23/2019   Exposure to COVID-19 virus 12/04/2018   Osteoarthritis 09/19/2018   Abscess of  left leg 09/26/2017   Left leg cellulitis 09/18/2017   Whiplash 06/10/2017   Cough 05/29/2017   Concussion 05/29/2017   Hammer toes of both feet 12/19/2016   Allergic rhinitis 12/09/2016   Pedal edema 09/17/2016   Chest pain 07/18/2016   Gross hematuria 04/26/2016   Prediabetes 03/21/2016   Dysuria 12/07/2015   Fever blister 12/07/2015   Choroidal nevus, 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   Malignant neoplasm of left female breast (HCC) 10/10/2015   Anxiety and depression 06/30/2015   Encounter for well adult exam with abnormal findings 06/30/2015   Neck mass 03/24/2015   Hypothyroidism 03/24/2015   Gout 03/24/2015   Essential hypertension 03/24/2015   Asthma 03/24/2015   Peripheral neuropathy  03/24/2015   Osteoporosis 03/24/2015   Morbid obesity (HCC) 03/24/2015   Long term current use of anticoagulant therapy 03/16/2015   Lymphadenopathy, cervical 03/16/2015   Diarrhea 03/16/2015   Hyperbilirubinemia 03/16/2015   Coronary arteriosclerosis 09/22/2014   Hyperlipidemia 07/23/2013   Hypokalemia 02/18/2013   Elevated bilirubin 02/18/2013   Morbid obesity with BMI of 45.0-49.9, adult (HCC) 10/29/2012   Rotator cuff tear arthropathy 10/29/2012   DJD (degenerative joint disease) of knee 10/29/2012   Plantar fasciitis    Arthritis    Hx of radiation therapy    Atrial fibrillation, chronic (HCC) 01/21/2011   Malignant neoplasm of upper-outer quadrant of left breast in female, estrogen receptor positive (HCC) 12/26/2010   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 Left > right for shoulder discomfort  Pt accompanied by: self  PERTINENT HISTORY: [name],  is a 87 y.o. female who presents for initial evaluation of a head injury and multiple contusions. MOI: She fell/rolled out of bed due to her nightgown being tangled in the bedding. She was transported via EMS to the [facility] ED and dx w/ a levator scapula hematoma. Today, pt c/o cont'd confusion, soreness in her neck, shoulders, and thoracic spine. Her daughter notes a "bump" on the back of pt's head.   PAIN:  Are you having pain? Yes: NPRS scale: 8/10 Pain location: "the whole neck" Pain description:  ache Aggravating factors: worse today after stiffness in the bed today; activity, overhead movements  Relieving factors: extra strength tylenol  PRECAUTIONS: None  WEIGHT BEARING RESTRICTIONS: No  FALLS: Has patient fallen in last 6 months? Yes. Number of falls 1  LIVING ENVIRONMENT: Lives with: lives alone Lives in: House/apartment Stairs: Yes: Internal: 2-3 steps; can reach both and typically uses rollator , ramp for entering/exiting home Has following equipment at home: Dan Humphreys - 4 wheeled  PLOF: Independent with household mobility with device and Independent with community mobility with device  PATIENT GOALS: reduce pain  OBJECTIVE:   TODAY'S TREATMENT: 12/03/22 Activity Comments  C-spine AROM Flexion: 50 deg Extension: 40 Right lateral flexion: 30  Left lateral: 15-20 Right rotation: 15 deg Left rotation: 25 deg  UE ROM Full and pain-free  TUG test 21 sec  Soft tissue mobilization Bilateral cervical column, levator, upper trap to address trigger point pain/dysfunction x  20 min            Access Code: [email or web address] URL: [email or web address] Date: 11/29/2022 Prepared by: [facility] - Outpatient  Rehab - [facility]  Exercises - Seated Cervical Sidebending Stretch  - 1 x daily - 7 x weekly - 3 sets - 30-60 sec hold - Seated Upper Trap Stretch  - 1 x daily - 7 x weekly - 3 sets - 30-60 sec hold - Cervical Retraction with Overpressure  - 1 x daily - 5 x weekly - 2 sets - 10 reps - 3 sec hold - Seated Assisted Cervical Rotation with Towel  - 1 x daily - 5 x weekly - 2 sets - 10 reps - Mid-Lower Cervical Extension SNAG with Strap  - 1 x daily - 5 x weekly - 2 sets - 10 reps - Romberg Stance with Eyes Closed  - 1 x daily - 5 x weekly - 2 sets - 30 sec hold - Romberg Stance with Head Nods  - 1 x daily - 5 x weekly - 2 sets - 30 sec hold - Supine Cervical Retraction with Towel  - 1 x daily - 7 x weekly - 2 sets - 5 reps - 3 sec hold      PATIENT  EDUCATION: Education details: HEP update- supine chin tuck Person educated: Patient Education method: Explanation, Demonstration, Tactile cues, Verbal cues, and Handouts Education comprehension: verbalized understanding and returned demonstration     Below measures were taken at time of initial evaluation unless otherwise specified:   DIAGNOSTIC FINDINGS:  IMPRESSION: No acute bony abnormality. Degenerative disc and facet disease throughout the cervical spine.   Enlargement of the left levator scapulae muscle with surrounding stranding most compatible with intramuscular hematoma.  COGNITION: Overall cognitive status: Within functional limits for tasks assessed   SENSATION: WNL  EDEMA:    MUSCLE TONE:  WNL   POSTURE:  No Significant postural limitations  Cervical ROM:    Active A/PROM (deg) eval  Flexion 50  Extension 40  Right lateral flexion 15  Left lateral flexion 15  Right rotation 15  Left rotation 20  (Blank rows = not tested)  UE ROM: WFL--able to elevate arms overhead full ROM but with discomfort in LUE  STRENGTH: 5/5 gross BUE  PALPATION:  multiple areas of tenderness/trigger points throughout left shoulder girdle and neck--more soft tissue than joint discomfort of the area  BED MOBILITY:  independent  TRANSFERS: Assistive device utilized: Environmental consultant - 4 wheeled  Sit to stand: Modified independence Stand to sit: Modified independence Chair to chair: Modified independence Floor:  NT  RAMP: Modified independence  CURB: Modified independence  GAIT: Gait pattern: WFL Distance walked:  Assistive device utilized: Environmental consultant - 4 wheeled Level of assistance: Modified independence Comments:   FUNCTIONAL TESTS:  Timed up and go (TUG): 23 sec w/ rollator Dynamic Gait Index: 13/24  M-CTSIB  Condition 1: Firm Surface, EO 30 Sec, Normal and Mild Sway  Condition 2: Firm Surface, EC 10 Sec, Moderate and Severe Sway  Condition 3: Foam Surface, EO 30  Sec, Normal Sway  Condition 4: Foam Surface, EC 4 Sec, Severe Sway        GOALS: Goals reviewed with patient? Yes  SHORT TERM GOALS: Target date: 11/15/2022    Patient will be independent in HEP to improve functional outcomes Baseline: Goal status: IN PROGRESS    LONG TERM GOALS: Target date: 11/29/2022    Exhibit improved C-spine ROM to 25 degrees lateral flexion and  30+ degrees rotation to improve comfort and safety when driving Baseline:  Flexion: 50 deg Extension: 40 Right lateral flexion: 30  Left lateral: 15-20 Right rotation: 15 deg Left rotation: 25 deg Goal status: IN PROGRESS  2.  Report left shoulder pain not exceeding 3/10 with overhead movements to improve comfort with household activity and self-care tasks Baseline: 2-3/10 Goal status: MET  3.  Demo reduced risk for falls per time 15 sec TUG test Baseline: 23 sec w/ rollator; (12/03/22) 21 sec w/ rollator Goal status: IN PROGRESS  4.  Improved postural stability per mild sway conditions 2 and 4 M-CTSIB x 15 sec to improve safety with ADL Baseline: severe Goal status: IN PROGRESS    ASSESSMENT:  CLINICAL IMPRESSION: Review of POC details with pt demo slight improvement in c-spine AROM and endorses greater rotation ROM.  Continues to exhibit trigger points along left > right trapezial ridge, levator, and scalenes with subsequent restriction and limitation in ROM and activity tolerance.  Tolerating treatment sessions well with relief following manual and dry needling but unfortunately not long lasting due to presence of continued trigger points.  Pt would benefit from continued sessions to improve neck pain, ROM, and enhance balance.   OBJECTIVE IMPAIRMENTS: decreased activity tolerance, decreased balance, decreased ROM, increased muscle spasms, impaired flexibility, and pain.   ACTIVITY LIMITATIONS: carrying, bending, sleeping, dressing, and reach over head  PARTICIPATION LIMITATIONS: meal prep,  cleaning, laundry, driving, shopping, and community activity  PERSONAL FACTORS: Time since onset of injury/illness/exacerbation and 1-2 comorbidities: knee OA  are also affecting patient's functional outcome.   REHAB POTENTIAL: Excellent  CLINICAL DECISION MAKING: Stable/uncomplicated  EVALUATION COMPLEXITY: Low   PLAN:  PT FREQUENCY: 1-2x/week  PT DURATION: 4 weeks  PLANNED INTERVENTIONS: Therapeutic exercises, Therapeutic activity, Neuromuscular re-education, Balance training, Gait training, Patient/Family education, Self Care, Joint mobilization, Stair training, Vestibular training, Canalith repositioning, DME instructions, Aquatic Therapy, Dry Needling, Electrical stimulation, Spinal mobilization, Cryotherapy, Moist heat, Traction, Ultrasound, Ionotophoresis 4mg /ml Dexamethasone, and Manual therapy  PLAN FOR NEXT SESSION:Inquire about positional dizziness; soft tissue work for neck and left scapula. Dry needling? Marland Kitchen Additional ROM/stretching for shoulder/neck. Balance activities as indicated   5:21 PM, 12/03/22 M. Shary Decamp, PT, DPT Physical Therapist- [place] Office Number: [phone]

## 2022-12-05 ENCOUNTER — Ambulatory Visit (INDEPENDENT_AMBULATORY_CARE_PROVIDER_SITE_OTHER): Payer: Medicare Other | Admitting: Family Medicine

## 2022-12-05 ENCOUNTER — Ambulatory Visit: Payer: Medicare Other | Attending: Family Medicine

## 2022-12-05 VITALS — BP 118/82 | HR 75 | Wt 227.0 lb

## 2022-12-05 DIAGNOSIS — R5383 Other fatigue: Secondary | ICD-10-CM

## 2022-12-05 DIAGNOSIS — M6281 Muscle weakness (generalized): Secondary | ICD-10-CM | POA: Diagnosis not present

## 2022-12-05 DIAGNOSIS — R262 Difficulty in walking, not elsewhere classified: Secondary | ICD-10-CM | POA: Diagnosis not present

## 2022-12-05 DIAGNOSIS — R2681 Unsteadiness on feet: Secondary | ICD-10-CM | POA: Insufficient documentation

## 2022-12-05 DIAGNOSIS — R42 Dizziness and giddiness: Secondary | ICD-10-CM | POA: Diagnosis not present

## 2022-12-05 DIAGNOSIS — M542 Cervicalgia: Secondary | ICD-10-CM | POA: Diagnosis not present

## 2022-12-05 DIAGNOSIS — R682 Dry mouth, unspecified: Secondary | ICD-10-CM

## 2022-12-05 LAB — COMPREHENSIVE METABOLIC PANEL
ALT: 13 U/L (ref 0–35)
AST: 20 U/L (ref 0–37)
Albumin: 4.1 g/dL (ref 3.5–5.2)
Alkaline Phosphatase: 76 U/L (ref 39–117)
BUN: 26 mg/dL — ABNORMAL HIGH (ref 6–23)
CO2: 33 mEq/L — ABNORMAL HIGH (ref 19–32)
Calcium: 10 mg/dL (ref 8.4–10.5)
Chloride: 100 mEq/L (ref 96–112)
Creatinine, Ser: 1.2 mg/dL (ref 0.40–1.20)
GFR: 41.34 mL/min — ABNORMAL LOW (ref >60.00)
Glucose, Bld: 104 mg/dL — ABNORMAL HIGH (ref 70–99)
Potassium: 3.6 mEq/L (ref 3.5–5.1)
Sodium: 140 mEq/L (ref 135–145)
Total Bilirubin: 1.3 mg/dL — ABNORMAL HIGH (ref 0.2–1.2)
Total Protein: 6.7 g/dL (ref 6.0–8.3)

## 2022-12-05 LAB — CBC WITH DIFFERENTIAL/PLATELET
Basophils Absolute: 0 10*3/uL (ref 0.0–0.1)
Basophils Relative: 0.5 % (ref 0.0–3.0)
Eosinophils Absolute: 0.2 10*3/uL (ref 0.0–0.7)
Eosinophils Relative: 2.3 % (ref 0.0–5.0)
HCT: 40 % (ref 36.0–46.0)
Hemoglobin: 12.8 g/dL (ref 12.0–15.0)
Lymphocytes Relative: 14.7 % (ref 12.0–46.0)
Lymphs Abs: 1.1 10*3/uL (ref 0.7–4.0)
MCHC: 32.1 g/dL (ref 30.0–36.0)
MCV: 101.7 fl — ABNORMAL HIGH (ref 78.0–100.0)
Monocytes Absolute: 0.8 10*3/uL (ref 0.1–1.0)
Monocytes Relative: 10.9 % (ref 3.0–12.0)
Neutro Abs: 5.1 10*3/uL (ref 1.4–7.7)
Neutrophils Relative %: 71.6 % (ref 43.0–77.0)
Platelets: 153 10*3/uL (ref 150.0–400.0)
RBC: 3.94 Mil/uL (ref 3.87–5.11)
RDW: 15.4 % (ref 11.5–15.5)
WBC: 7.2 10*3/uL (ref 4.0–10.5)

## 2022-12-05 MED ORDER — TRIAMCINOLONE ACETONIDE 0.1 % EX CREA: 1.0000 | TOPICAL_CREAM | Freq: Two times a day (BID) | CUTANEOUS | 0 refills | Status: DC

## 2022-12-05 NOTE — Telephone Encounter (Signed)
Patient came into the office and saw Dr [name].  She is following up with him on Monday.

## 2022-12-05 NOTE — Therapy (Signed)
OUTPATIENT PHYSICAL THERAPY VESTIBULAR TREATMENT     Patient Name: [name] MRN: [id number] DOB:[birth year], 87 y.o., female Today's Date: 12/05/2022    END OF SESSION:  PT End of Session - 12/05/22 1454     Visit Number 9    Number of Visits 16    Date for PT Re-Evaluation 12/31/22    Authorization Type [employer]    Progress Note Due on Visit 18    PT Start Time 1450    PT Stop Time 1530    PT Time Calculation (min) 40 min    Activity Tolerance Patient tolerated treatment well    Behavior During Therapy WFL for tasks assessed/performed                 Past Medical History:  Diagnosis Date   Allergy    Anxiety    Arthritis    no cartlidge in knees   Asthma    Bradycardia    a. atenolol stopped due to this, HR 40s.   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  CAD s/p RCA stent 2000 with residual mild-mod disease of left system 2001.   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   Osteoporosis 03/24/2015   Peripheral neuropathy 03/24/2015   Plantar fasciitis    Sleep apnea    Past Surgical History:  Procedure Laterality Date   BREAST LUMPECTOMY W/ NEEDLE LOCALIZATION  01/29/2011   Left with [facility] Dr Jamey Ripa   CHOLECYSTECTOMY  1955   CORONARY ANGIOPLASTY WITH STENT PLACEMENT  2000   DILATION AND CURETTAGE OF UTERUS  1976   and 1996   HAND SURGERY  1992   tendons between thumb and forefinger   SKIN BIOPSY     1994, 2006, 2008, 2010, 2011, 2011, 2012-  pre-cancerous   TONSILLECTOMY  1955   Patient Active Problem List   Diagnosis Date Noted   Cloudy urine 11/25/2022   OSA  (obstructive sleep apnea) 11/25/2022   Insomnia 07/27/2022   Vitamin D deficiency 07/27/2022   Pulmonary hypertension (HCC) 11/07/2021   Bronchiectasis without complication (HCC) 11/07/2021   Hepatic cirrhosis (HCC) 11/06/2021   ILD (interstitial lung disease) (HCC) 10/03/2021   Abnormal LFTs 09/28/2021   Weight loss 08/19/2021   Chronic respiratory failure with hypoxia (HCC) 08/19/2021   Debility 06/29/2021   Pneumonia 06/26/2021   Multifocal pneumonia 06/15/2021   Elevated troponin 06/12/2021   Febrile illness 05/22/2021   Anxiety state 10/23/2020   Pure hypercholesterolemia 10/23/2020   Urinary tract infectious disease 10/23/2020   Atherosclerosis of coronary artery 10/23/2020   Sinus node dysfunction (HCC) 10/23/2020   Mixed hyperlipidemia 10/23/2020   OSA on CPAP 10/23/2020   Fall 09/05/2020   Nausea 04/23/2020   Constipation 04/23/2020   Posterior uveitis, 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   Dysphagia 01/21/2020   CHF (congestive heart failure) (HCC) 11/23/2019   Exposure to COVID-19 virus 12/04/2018   Osteoarthritis 09/19/2018   Abscess of left leg 09/26/2017   Left leg cellulitis 09/18/2017   Whiplash 06/10/2017   Cough 05/29/2017   Concussion 05/29/2017  Hammer toes of both feet 12/19/2016   Allergic rhinitis 12/09/2016   Pedal edema 09/17/2016   Chest pain 07/18/2016   Gross hematuria 04/26/2016   Prediabetes 03/21/2016   Dysuria 12/07/2015   Fever blister 12/07/2015   Choroidal nevus, 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   Malignant neoplasm of left female breast (HCC) 10/10/2015   Anxiety and depression 06/30/2015   Encounter for well adult exam with abnormal findings 06/30/2015   Neck mass 03/24/2015   Hypothyroidism 03/24/2015   Gout 03/24/2015   Essential hypertension 03/24/2015   Asthma 03/24/2015   Peripheral neuropathy 03/24/2015   Osteoporosis 03/24/2015   Morbid obesity (HCC) 03/24/2015   Long term current use of anticoagulant therapy 03/16/2015   Lymphadenopathy,  cervical 03/16/2015   Diarrhea 03/16/2015   Hyperbilirubinemia 03/16/2015   Coronary arteriosclerosis 09/22/2014   Hyperlipidemia 07/23/2013   Hypokalemia 02/18/2013   Elevated bilirubin 02/18/2013   Morbid obesity with BMI of 45.0-49.9, adult (HCC) 10/29/2012   Rotator cuff tear arthropathy 10/29/2012   DJD (degenerative joint disease) of knee 10/29/2012   Plantar fasciitis    Arthritis    Hx of radiation therapy    Atrial fibrillation, chronic (HCC) 01/21/2011   Malignant neoplasm of upper-outer quadrant of left breast in female, estrogen receptor positive (HCC) 12/26/2010   Breast cancer (IL[facility]) Receptor + her2 _ 12/26/2010    PCP: [name], MD REFERRING PROVIDER: Rodolph Bong, MD  REFERRING DIAG:  S06.0X0D (ICD-10-CM) - Concussion without loss of consciousness, subsequent encounter    THERAPY DIAG:  Muscle weakness (generalized)  Unsteadiness on feet  Dizziness and giddiness  Difficulty in walking, not elsewhere classified  ONSET DATE: 10/15/22  Rationale for Evaluation and Treatment: Rehabilitation  SUBJECTIVE:   SUBJECTIVE STATEMENT: Feeling increasing HA and nausea today. Went to see Dr. [name] for a check-up.  Denies any dizziness but feeling unsteady.  Pt accompanied by: self  PERTINENT HISTORY: [name],  is a 87 y.o. female who presents for initial evaluation of a head injury and multiple contusions. MOI: She fell/rolled out of bed due to her nightgown being tangled in the bedding. She was transported via EMS to the [facility] ED and dx w/ a levator scapula hematoma. Today, pt c/o cont'd confusion, soreness in her neck, shoulders, and thoracic spine. Her daughter notes a "bump" on the back of pt's head.   PAIN:  Are you having pain? Yes: NPRS scale: 2.5/10 Pain location: "the whole neck and HA" Pain description: ache Aggravating factors: worse today after stiffness in the bed today; activity, overhead movements  Relieving factors: extra strength  tylenol  PRECAUTIONS: None  WEIGHT BEARING RESTRICTIONS: No  FALLS: Has patient fallen in last 6 months? Yes. Number of falls 1  LIVING ENVIRONMENT: Lives with: lives alone Lives in: House/apartment Stairs: Yes: Internal: 2-3 steps; can reach both and typically uses rollator , ramp for entering/exiting home Has following equipment at home: Dan Humphreys - 4 wheeled  PLOF: Independent with household mobility with device and Independent with community mobility with device  PATIENT GOALS: reduce pain  OBJECTIVE:   TODAY'S TREATMENT: 12/05/22 Activity Comments  Continuous wave ultrasound x 8 min 1 Mhz, 0.5 w/cm to left levator at site of hematoma to improve local circulation and decrease pain  Soft tissue mobilization -Bilateral trap ridge and left levator to c-spine multifidi via Effleurage, pettrisage, and trigger point release to muscle belly to reduce pain, guarding/spasm for incr ROM -PROM c-spine all planes 2x10 with gentle end-range stretch  TODAY'S TREATMENT: 12/03/22 Activity Comments  C-spine AROM Flexion: 50 deg Extension: 40 Right lateral flexion: 30  Left lateral: 15-20 Right rotation: 15 deg Left rotation: 25 deg  UE ROM Full and pain-free  TUG test 21 sec  Soft tissue mobilization Bilateral cervical column, levator, upper trap to address trigger point pain/dysfunction x 20 min            Access Code: [email or web address] URL: [email or web address] Date: 11/29/2022 Prepared by: [place] - Outpatient  Rehab - [facility]  Exercises - Seated Cervical Sidebending Stretch  - 1 x daily - 7 x weekly - 3 sets - 30-60 sec hold - Seated Upper Trap Stretch  - 1 x daily - 7 x weekly - 3 sets - 30-60 sec hold - Cervical Retraction with Overpressure  - 1 x daily - 5 x weekly - 2 sets - 10 reps - 3 sec hold - Seated Assisted Cervical Rotation with Towel  - 1 x daily - 5 x weekly - 2 sets - 10 reps - Mid-Lower Cervical Extension SNAG with Strap  - 1 x  daily - 5 x weekly - 2 sets - 10 reps - Romberg Stance with Eyes Closed  - 1 x daily - 5 x weekly - 2 sets - 30 sec hold - Romberg Stance with Head Nods  - 1 x daily - 5 x weekly - 2 sets - 30 sec hold - Supine Cervical Retraction with Towel  - 1 x daily - 7 x weekly - 2 sets - 5 reps - 3 sec hold      PATIENT EDUCATION: Education details: HEP update- supine chin tuck Person educated: Patient Education method: Explanation, Demonstration, Tactile cues, Verbal cues, and Handouts Education comprehension: verbalized understanding and returned demonstration     Below measures were taken at time of initial evaluation unless otherwise specified:   DIAGNOSTIC FINDINGS:  IMPRESSION: No acute bony abnormality. Degenerative disc and facet disease throughout the cervical spine.   Enlargement of the left levator scapulae muscle with surrounding stranding most compatible with intramuscular hematoma.  COGNITION: Overall cognitive status: Within functional limits for tasks assessed   SENSATION: WNL  EDEMA:    MUSCLE TONE:  WNL   POSTURE:  No Significant postural limitations  Cervical ROM:    Active A/PROM (deg) eval  Flexion 50  Extension 40  Right lateral flexion 15  Left lateral flexion 15  Right rotation 15  Left rotation 20  (Blank rows = not tested)  UE ROM: WFL--able to elevate arms overhead full ROM but with discomfort in LUE  STRENGTH: 5/5 gross BUE  PALPATION:  multiple areas of tenderness/trigger points throughout left shoulder girdle and neck--more soft tissue than joint discomfort of the area  BED MOBILITY:  independent  TRANSFERS: Assistive device utilized: Environmental consultant - 4 wheeled  Sit to stand: Modified independence Stand to sit: Modified independence Chair to chair: Modified independence Floor:  NT  RAMP: Modified independence  CURB: Modified independence  GAIT: Gait pattern: WFL Distance walked:  Assistive device utilized: Environmental consultant - 4  wheeled Level of assistance: Modified independence Comments:   FUNCTIONAL TESTS:  Timed up and go (TUG): 23 sec w/ rollator Dynamic Gait Index: 13/24  M-CTSIB  Condition 1: Firm Surface, EO 30 Sec, Normal and Mild Sway  Condition 2: Firm Surface, EC 10 Sec, Moderate and Severe Sway  Condition 3: Foam Surface, EO 30 Sec, Normal Sway  Condition 4: Foam Surface, EC 4 Sec, Severe Sway  GOALS: Goals reviewed with patient? Yes  SHORT TERM GOALS: Target date: 11/15/2022    Patient will be independent in HEP to improve functional outcomes Baseline: Goal status: IN PROGRESS    LONG TERM GOALS: Target date: 11/29/2022    Exhibit improved C-spine ROM to 25 degrees lateral flexion and 30+ degrees rotation to improve comfort and safety when driving Baseline:  Flexion: 50 deg Extension: 40 Right lateral flexion: 30  Left lateral: 15-20 Right rotation: 15 deg Left rotation: 25 deg Goal status: IN PROGRESS  2.  Report left shoulder pain not exceeding 3/10 with overhead movements to improve comfort with household activity and self-care tasks Baseline: 2-3/10 Goal status: MET  3.  Demo reduced risk for falls per time 15 sec TUG test Baseline: 23 sec w/ rollator; (12/03/22) 21 sec w/ rollator Goal status: IN PROGRESS  4.  Improved postural stability per mild sway conditions 2 and 4 M-CTSIB x 15 sec to improve safety with ADL Baseline: severe Goal status: IN PROGRESS    ASSESSMENT:  CLINICAL IMPRESSION: Initiated with use of therapeutic ultrasound to increase localized circulation to site of left levator hematoma. Followed by soft tissue mobilization to involved scapular and cervical columns to improve mobility and reduce pain/guarding to enable greater cervical ROM for functional activities.  Tolerated well without adverse effect.  Pt reports compliance with HEP and progressing with POC details. Recommend continued tx as her cervical spine ROM and pain are still quite  prominent and limiting her ability as she is not able to drive due to limited ROM.  Pt also has balance and mobility deficits that require attention  OBJECTIVE IMPAIRMENTS: decreased activity tolerance, decreased balance, decreased ROM, increased muscle spasms, impaired flexibility, and pain.   ACTIVITY LIMITATIONS: carrying, bending, sleeping, dressing, and reach over head  PARTICIPATION LIMITATIONS: meal prep, cleaning, laundry, driving, shopping, and community activity  PERSONAL FACTORS: Time since onset of injury/illness/exacerbation and 1-2 comorbidities: knee OA  are also affecting patient's functional outcome.   REHAB POTENTIAL: Excellent  CLINICAL DECISION MAKING: Stable/uncomplicated  EVALUATION COMPLEXITY: Low   PLAN:  PT FREQUENCY: 1-2x/week  PT DURATION: 4 weeks  PLANNED INTERVENTIONS: Therapeutic exercises, Therapeutic activity, Neuromuscular re-education, Balance training, Gait training, Patient/Family education, Self Care, Joint mobilization, Stair training, Vestibular training, Canalith repositioning, DME instructions, Aquatic Therapy, Dry Needling, Electrical stimulation, Spinal mobilization, Cryotherapy, Moist heat, Traction, Ultrasound, Ionotophoresis 4mg /ml Dexamethasone, and Manual therapy  PLAN FOR NEXT SESSION:Corner balance activities, Additional ROM/stretching for shoulder/neck. Balance activities as indicated   2:54 PM, 12/05/22 M. Shary Decamp, PT, DPT Physical Therapist- [place] Office Number: [phone]

## 2022-12-05 NOTE — Patient Instructions (Signed)
Thank you for coming in today.   Please get labs today before you leave   Schedule with me next week.   GO to the ER if you worsen.

## 2022-12-05 NOTE — Progress Notes (Signed)
    [name] is a 87 y.o. female who presents to [facility] at [facility] today for dry mouth fatigue itching and vague dizziness.  She was seen by me on June 20 and July 22 for concussion and neck pain.  She has been attending physical therapy and overall improving.  Yesterday she felt fatigued and had itching and dry mouth.  She took a Benadryl which helped the itching but caused worse dry mouth and worse fatigue.  She is scheduled to have physical therapy today and drop by the office today at lunchtime to make sure that she was okay to go to PT.   Pertinent review of systems: No fevers or chills.  Positive for dry mouth and nausea and fatigue and itching.  Relevant historical information: Heart failure.   Exam:  BP 118/82   Pulse 75   Wt 227 lb (103 kg)   SpO2 96%   BMI 36.64 kg/m  General: Well-appearing nontoxic.  No acute distress.  Heart and lungs: Heart regular rate and rhythm.  No murmurs rubs or gallops. Lungs clear to auscultation bilaterally. Skin no erythematous rash visible. Neuro alert and oriented walking with a walker.  Uses all extremities normally and symmetrically.  No weakness.  No slurred speech.       Assessment and Plan: 87 y.o. female with fatigue itching worsening neck pain.  Etiology is unclear.  Her symptoms sound somewhat like a medication adverse reaction.  She just completed Cipro for UTI but has tolerated well in the past.  Additionally she is taking tramadol intermittently.  I am not sure if any of these medicines are causing her symptoms.  We discussed options.  I think she is well enough to attend physical therapy now.  However if her symptoms worsen she should go to the emergency room.  Will check basic labs and prescribe some triamcinolone cream.  Encouraged her to avoid Benadryl.  She will schedule with me early next week.   PDMP not reviewed this encounter. Orders Placed This Encounter  Procedures   Comprehensive  metabolic panel    Standing Status:   Future    Number of Occurrences:   1    Standing Expiration Date:   12/05/2023   CBC w/Diff   Meds ordered this encounter  Medications   triamcinolone cream (KENALOG) 0.1 %    Sig: Apply 1 Application topically 2 (two) times daily.    Dispense:  453.6 g    Refill:  0     Discussed warning signs or symptoms. Please see discharge instructions. Patient expresses understanding.   The above documentation has been reviewed and is accurate and complete [name], M.D.

## 2022-12-06 NOTE — Progress Notes (Signed)
Kidney function is worsening over the last 2 weeks.  Recommend scheduling appointment with your primary care provider.  If you feel worse go to the emergency room.

## 2022-12-09 ENCOUNTER — Ambulatory Visit: Payer: Medicare Other | Admitting: Family Medicine

## 2022-12-09 VITALS — BP 118/82 | HR 83 | Ht 66.0 in | Wt 225.0 lb

## 2022-12-09 DIAGNOSIS — M542 Cervicalgia: Secondary | ICD-10-CM

## 2022-12-09 DIAGNOSIS — K5900 Constipation, unspecified: Secondary | ICD-10-CM | POA: Diagnosis not present

## 2022-12-09 DIAGNOSIS — N179 Acute kidney failure, unspecified: Secondary | ICD-10-CM | POA: Diagnosis not present

## 2022-12-09 MED ORDER — BISACODYL 10 MG RE SUPP: 10.0000 mg | RECTAL | 0 refills | Status: AC | PRN

## 2022-12-09 NOTE — Patient Instructions (Addendum)
Thank you for coming in today.   dulcolax suppository   Parafin wax suppository or a fleets enema could help in addition to miralax.   If the constipation does not resovle go to you PCP or even the ER.   Schedule with your PCP this week or next week.   Continue PT.   Recheck in 1 month.

## 2022-12-09 NOTE — Progress Notes (Signed)
[name], PhD, LAT, ATC acting as a scribe for [name], MD.  [name] is a 87 y.o. female who [facility] f/u head injury. Pt stopped by the clinic unannounced on 8/1 w/ multiple new complains to make sure she was OK to proceed to PT. At last scheduled visit and was advised to cont vestibular therapy, completing 9 visits.   Today, pt reports her neck and head injury related issues are much improved. She c/o severe constipation. She has been taking Dulcolax for the last 4 days. She is unsure date of last BM, but estimates 5 days. She eats a variety of fruits daily and prune juice and has tried Miralax.  She does note some bright red blood in her stool.  Previously the dominant issue is neck pain.  She was seen last Friday for systemic unwell which has resolved.  Pertinent review of systems: No fevers or chills  Relevant historical information: Heart failure.  CKD.  Hepatic cirrhosis.   Exam:  BP 118/82   Pulse 83   Ht 5\' 6"  (1.676 m)   Wt 225 lb (102.1 kg)   SpO2 98%   BMI 36.32 kg/m  General: Well Developed, well nourished, and in no acute distress.   MSK: C-spine: Normal appearing Nontender palpation spinal midline decreased cervical motion.    Lab and Radiology Results    Chemistry      Component Value Date/Time   NA 140 12/05/2022 1216   NA 147 (H) 06/06/2021 1607   NA 143 06/27/2016 0839   K 3.6 12/05/2022 1216   K 4.0 06/27/2016 0839   CL 100 12/05/2022 1216   CL 105 07/15/2012 1409   CO2 33 (H) 12/05/2022 1216   CO2 28 06/27/2016 0839   BUN 26 (H) 12/05/2022 1216   BUN 14 06/06/2021 1607   BUN 17.3 06/27/2016 0839   CREATININE 1.20 12/05/2022 1216   CREATININE 0.83 11/24/2019 0958   CREATININE 0.8 06/27/2016 0839      Component Value Date/Time   CALCIUM 10.0 12/05/2022 1216   CALCIUM 9.7 06/27/2016 0839   ALKPHOS 76 12/05/2022 1216   ALKPHOS 65 06/27/2016 0839   AST 20 12/05/2022 1216    AST 15 06/27/2016 0839   ALT 13 12/05/2022 1216   ALT 14 06/27/2016 0839   BILITOT 1.3 (H) 12/05/2022 1216   BILITOT 0.8 06/09/2019 0921   BILITOT 1.32 (H) 06/27/2016 0839     Lab Results  Component Value Date   WBC 7.2 12/05/2022   HGB 12.8 12/05/2022   HCT 40.0 12/05/2022   MCV 101.7 (H) 12/05/2022   PLT 153.0 12/05/2022       Assessment and Plan: 87 y.o. female with concussion and neck pain.  Overall improving.  Plan to continue physical therapy.  Recheck in about a month.  New constipation.  Patient has not had a bowel movement in 4 or 5 days.  This is associated with some bright red blood per rectum. I recommended some over-the-counter medications including bisacodyl suppositories and or a fleets enema.  However I recommend that she schedule an appoint with her PCP in the near future for this issue as well as decreased creatinine clearance noted on labs on Friday.   PDMP not reviewed this encounter. No orders of the defined types were placed in this encounter.  Meds ordered this encounter  Medications   bisacodyl (DULCOLAX) 10 MG suppository    Sig: Place  1 suppository (10 mg total) rectally as needed for moderate constipation.    Dispense:  12 suppository    Refill:  0     Discussed warning signs or symptoms. Please see discharge instructions. Patient expresses understanding.   The above documentation has been reviewed and is accurate and complete [name], M.D.

## 2022-12-12 ENCOUNTER — Ambulatory Visit: Payer: Medicare Other | Admitting: Physical Therapy

## 2022-12-12 ENCOUNTER — Encounter: Payer: Self-pay | Admitting: Physical Therapy

## 2022-12-12 DIAGNOSIS — R42 Dizziness and giddiness: Secondary | ICD-10-CM

## 2022-12-12 DIAGNOSIS — R262 Difficulty in walking, not elsewhere classified: Secondary | ICD-10-CM | POA: Diagnosis not present

## 2022-12-12 DIAGNOSIS — R2681 Unsteadiness on feet: Secondary | ICD-10-CM

## 2022-12-12 DIAGNOSIS — M542 Cervicalgia: Secondary | ICD-10-CM

## 2022-12-12 DIAGNOSIS — M6281 Muscle weakness (generalized): Secondary | ICD-10-CM

## 2022-12-12 NOTE — Therapy (Signed)
OUTPATIENT PHYSICAL THERAPY VESTIBULAR TREATMENT     Patient Name: [name] MRN: [id number] DOB:07-02-1935, 87 y.o., female Today's Date: 12/12/2022    END OF SESSION:  PT End of Session - 12/12/22 1044     Visit Number 10    Number of Visits 16    Date for PT Re-Evaluation 12/31/22    Authorization Type [employer]    Progress Note Due on Visit 18    PT Start Time 1002    PT Stop Time 1047    PT Time Calculation (min) 45 min    Equipment Utilized During Treatment Gait belt    Activity Tolerance Patient tolerated treatment well    Behavior During Therapy WFL for tasks assessed/performed                  Past Medical History:  Diagnosis Date   Allergy    Anxiety    Arthritis    no cartlidge in knees   Asthma    Bradycardia    a. atenolol stopped due to this, HR 40s.   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  CAD s/p RCA stent 2000 with residual mild-mod disease of left system 2001.   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   Osteoporosis 03/24/2015   Peripheral neuropathy 03/24/2015   Plantar fasciitis    Sleep apnea    Past Surgical History:  Procedure Laterality Date   BREAST LUMPECTOMY W/ NEEDLE LOCALIZATION  01/29/2011   Left with [facility] Dr Jamey Ripa   CHOLECYSTECTOMY  1955   CORONARY ANGIOPLASTY WITH STENT PLACEMENT  2000   DILATION AND CURETTAGE OF UTERUS  1976   and 1996   HAND SURGERY  1992   tendons between thumb and forefinger   SKIN BIOPSY     1994, 2006, 2008, 2010, 2011, 2011, 2012-  pre-cancerous   TONSILLECTOMY  1955   Patient Active Problem List   Diagnosis  Date Noted   Cloudy urine 11/25/2022   OSA (obstructive sleep apnea) 11/25/2022   Insomnia 07/27/2022   Vitamin D deficiency 07/27/2022   Pulmonary hypertension (HCC) 11/07/2021   Bronchiectasis without complication (HCC) 11/07/2021   Hepatic cirrhosis (HCC) 11/06/2021   ILD (interstitial lung disease) (HCC) 10/03/2021   Abnormal LFTs 09/28/2021   Weight loss 08/19/2021   Chronic respiratory failure with hypoxia (HCC) 08/19/2021   Debility 06/29/2021   Pneumonia 06/26/2021   Multifocal pneumonia 06/15/2021   Elevated troponin 06/12/2021   Febrile illness 05/22/2021   Anxiety state 10/23/2020   Pure hypercholesterolemia 10/23/2020   Urinary tract infectious disease 10/23/2020   Atherosclerosis of coronary artery 10/23/2020   Sinus node dysfunction (HCC) 10/23/2020   Mixed hyperlipidemia 10/23/2020   OSA on CPAP 10/23/2020   Fall 09/05/2020   Nausea 04/23/2020   Constipation 04/23/2020   Posterior uveitis, 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   Dysphagia 01/21/2020   CHF (congestive heart failure) (HCC) 11/23/2019   Exposure to COVID-19 virus 12/04/2018   Osteoarthritis 09/19/2018   Abscess of left leg 09/26/2017   Left leg cellulitis 09/18/2017   Whiplash 06/10/2017  Cough 05/29/2017   Concussion 05/29/2017   Hammer toes of both feet 12/19/2016   Allergic rhinitis 12/09/2016   Pedal edema 09/17/2016   Chest pain 07/18/2016   Gross hematuria 04/26/2016   Prediabetes 03/21/2016   Dysuria 12/07/2015   Fever blister 12/07/2015   Choroidal nevus, 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   Malignant neoplasm of left female breast (HCC) 10/10/2015   Anxiety and depression 06/30/2015   Encounter for well adult exam with abnormal findings 06/30/2015   Neck mass 03/24/2015   Hypothyroidism 03/24/2015   Gout 03/24/2015   Essential hypertension 03/24/2015   Asthma 03/24/2015   Peripheral neuropathy 03/24/2015   Osteoporosis 03/24/2015   Morbid obesity (HCC) 03/24/2015   Long term current use of  anticoagulant therapy 03/16/2015   Lymphadenopathy, cervical 03/16/2015   Diarrhea 03/16/2015   Hyperbilirubinemia 03/16/2015   Coronary arteriosclerosis 09/22/2014   Hyperlipidemia 07/23/2013   Hypokalemia 02/18/2013   Elevated bilirubin 02/18/2013   Morbid obesity with BMI of 45.0-49.9, adult (HCC) 10/29/2012   Rotator cuff tear arthropathy 10/29/2012   DJD (degenerative joint disease) of knee 10/29/2012   Plantar fasciitis    Arthritis    Hx of radiation therapy    Atrial fibrillation, chronic (HCC) 01/21/2011   Malignant neoplasm of upper-outer quadrant of left breast in female, estrogen receptor positive (HCC) 12/26/2010   Breast cancer (IL[facility]) Receptor + her2 _ 12/26/2010    PCP: [name], MD REFERRING PROVIDER: Rodolph Bong, MD  REFERRING DIAG:  S06.0X0D (ICD-10-CM) - Concussion without loss of consciousness, subsequent encounter    THERAPY DIAG:  Muscle weakness (generalized)  Unsteadiness on feet  Dizziness and giddiness  Neck pain  ONSET DATE: 10/15/22  Rationale for Evaluation and Treatment: Rehabilitation  SUBJECTIVE:   SUBJECTIVE STATEMENT: Feeling upset about her experience with a home care company she was trying to get set up.  Reports neck are shoulders are relaxing considerably. "Ive been getting along much better and you all are doing a wonderful job."  Pt accompanied by: self  PERTINENT HISTORY: [name],  is a 87 y.o. female who presents for initial evaluation of a head injury and multiple contusions. MOI: She fell/rolled out of bed due to her nightgown being tangled in the bedding. She was transported via EMS to the [facility] ED and dx w/ a levator scapula hematoma. Today, pt c/o cont'd confusion, soreness in her neck, shoulders, and thoracic spine. Her daughter notes a "bump" on the back of pt's head.   PAIN:  Are you having pain? Yes: NPRS scale: 0/10 Pain location: "the whole neck and HA" Pain description: ache Aggravating factors:  worse today after stiffness in the bed today; activity, overhead movements  Relieving factors: extra strength tylenol  PRECAUTIONS: None  WEIGHT BEARING RESTRICTIONS: No  FALLS: Has patient fallen in last 6 months? Yes. Number of falls 1  LIVING ENVIRONMENT: Lives with: lives alone Lives in: House/apartment Stairs: Yes: Internal: 2-3 steps; can reach both and typically uses rollator , ramp for entering/exiting home Has following equipment at home: Dan Humphreys - 4 wheeled  PLOF: Independent with household mobility with device and Independent with community mobility with device  PATIENT GOALS: reduce pain  OBJECTIVE:     TODAY'S TREATMENT: 12/12/22 Activity Comments  multidirectional stepping to targets Heavy 1 UE support required and short step length   alt toe tap on step   Required 1 fingertip support  romberg EO/EC 30" Mild sway EO, mod-severe sway with EC, requiring at least 1 fingertip  support (able to safely self-stabilize with single fingertip)  Skilled palpation and monitoring of tissues during TPDN Palpation tender trigger pts evident in B suboccipitals     Trigger Point Dry-Needling  Treatment instructions: Expect mild to moderate muscle soreness. S/S of pneumothorax if dry needled over a lung field, and to seek immediate medical attention should they occur. Patient verbalized understanding of these instructions and education.  Patient Consent Given: Yes Education handout provided: Previously provided Muscles treated: B suboccipitals using standard technique  Electrical stimulation performed: No Parameters: N/A Treatment response/outcome: tolerated well     HOME EXERCISE PROGRAM Last updated: 12/12/22 Access Code: [email or web address] URL: [email or web address] Date: 12/12/2022 Prepared by: [facility] - Outpatient  Rehab - [facility]  Exercises - Seated Cervical Sidebending Stretch  - 1 x daily - 7 x weekly - 3 sets - 30-60 sec hold - Seated Upper Trap  Stretch  - 1 x daily - 7 x weekly - 3 sets - 30-60 sec hold - Cervical Retraction with Overpressure  - 1 x daily - 5 x weekly - 2 sets - 10 reps - 3 sec hold - Seated Assisted Cervical Rotation with Towel  - 1 x daily - 5 x weekly - 2 sets - 10 reps - Mid-Lower Cervical Extension SNAG with Strap  - 1 x daily - 5 x weekly - 2 sets - 10 reps - Romberg Stance with Eyes Closed  - 1 x daily - 5 x weekly - 2 sets - 30 sec hold - Romberg Stance with Head Nods  - 1 x daily - 5 x weekly - 2 sets - 30 sec hold - Supine Cervical Retraction with Towel  - 1 x daily - 7 x weekly - 2 sets - 5 reps - 3 sec hold - Standing Toe Taps  - 1 x daily - 5 x weekly - 2 sets - 10 reps - Corner Balance Feet Together With Eyes Open  - 1 x daily - 5 x weekly - 2 sets - 30 sec hold - Corner Balance Feet Together With Eyes Closed  - 1 x daily - 5 x weekly - 2 sets - 30 sec hold   PATIENT EDUCATION: Education details: HEP update with edu for safety; edu on DN Person educated: Patient Education method: Explanation, Demonstration, Tactile cues, Verbal cues, and Handouts Education comprehension: verbalized understanding and returned demonstration     Below measures were taken at time of initial evaluation unless otherwise specified:   DIAGNOSTIC FINDINGS:  IMPRESSION: No acute bony abnormality. Degenerative disc and facet disease throughout the cervical spine.   Enlargement of the left levator scapulae muscle with surrounding stranding most compatible with intramuscular hematoma.  COGNITION: Overall cognitive status: Within functional limits for tasks assessed   SENSATION: WNL  EDEMA:    MUSCLE TONE:  WNL   POSTURE:  No Significant postural limitations  Cervical ROM:    Active A/PROM (deg) eval  Flexion 50  Extension 40  Right lateral flexion 15  Left lateral flexion 15  Right rotation 15  Left rotation 20  (Blank rows = not tested)  UE ROM: WFL--able to elevate arms overhead full ROM but  with discomfort in LUE  STRENGTH: 5/5 gross BUE  PALPATION:  multiple areas of tenderness/trigger points throughout left shoulder girdle and neck--more soft tissue than joint discomfort of the area  BED MOBILITY:  independent  TRANSFERS: Assistive device utilized: Environmental consultant - 4 wheeled  Sit to stand: Modified independence Stand to sit: Modified independence  Chair to chair: Modified independence Floor:  NT  RAMP: Modified independence  CURB: Modified independence  GAIT: Gait pattern: WFL Distance walked:  Assistive device utilized: Environmental consultant - 4 wheeled Level of assistance: Modified independence Comments:   FUNCTIONAL TESTS:  Timed up and go (TUG): 23 sec w/ rollator Dynamic Gait Index: 13/24  M-CTSIB  Condition 1: Firm Surface, EO 30 Sec, Normal and Mild Sway  Condition 2: Firm Surface, EC 10 Sec, Moderate and Severe Sway  Condition 3: Foam Surface, EO 30 Sec, Normal Sway  Condition 4: Foam Surface, EC 4 Sec, Severe Sway        GOALS: Goals reviewed with patient? Yes  SHORT TERM GOALS: Target date: 11/15/2022    Patient will be independent in HEP to improve functional outcomes Baseline: Goal status: IN PROGRESS    LONG TERM GOALS: Target date: 11/29/2022    Exhibit improved C-spine ROM to 25 degrees lateral flexion and 30+ degrees rotation to improve comfort and safety when driving Baseline:  Flexion: 50 deg Extension: 40 Right lateral flexion: 30  Left lateral: 15-20 Right rotation: 15 deg Left rotation: 25 deg Goal status: IN PROGRESS  2.  Report left shoulder pain not exceeding 3/10 with overhead movements to improve comfort with household activity and self-care tasks Baseline: 2-3/10 Goal status: MET  3.  Demo reduced risk for falls per time 15 sec TUG test Baseline: 23 sec w/ rollator; (12/03/22) 21 sec w/ rollator Goal status: IN PROGRESS  4.  Improved postural stability per mild sway conditions 2 and 4 M-CTSIB x 15 sec to improve safety with  ADL Baseline: severe Goal status: IN PROGRESS    ASSESSMENT:  CLINICAL IMPRESSION: Patient arrived to session with report of continued improvement in soft tissue restriction in her neck and shoulders. Worked on dynamic balance activities, requiring at least 1 UE support with most tasks. Patient reports surprise at challenge of these activities, thus updated into HEP for safe practice at home. After receiving verbal consent, patient tolerated DN to B suboccipitals and remarked on improver cervical rotation AROM at end of session.   OBJECTIVE IMPAIRMENTS: decreased activity tolerance, decreased balance, decreased ROM, increased muscle spasms, impaired flexibility, and pain.   ACTIVITY LIMITATIONS: carrying, bending, sleeping, dressing, and reach over head  PARTICIPATION LIMITATIONS: meal prep, cleaning, laundry, driving, shopping, and community activity  PERSONAL FACTORS: Time since onset of injury/illness/exacerbation and 1-2 comorbidities: knee OA  are also affecting patient's functional outcome.   REHAB POTENTIAL: Excellent  CLINICAL DECISION MAKING: Stable/uncomplicated  EVALUATION COMPLEXITY: Low   PLAN:  PT FREQUENCY: 1-2x/week  PT DURATION: 4 weeks  PLANNED INTERVENTIONS: Therapeutic exercises, Therapeutic activity, Neuromuscular re-education, Balance training, Gait training, Patient/Family education, Self Care, Joint mobilization, Stair training, Vestibular training, Canalith repositioning, DME instructions, Aquatic Therapy, Dry Needling, Electrical stimulation, Spinal mobilization, Cryotherapy, Moist heat, Traction, Ultrasound, Ionotophoresis 4mg /ml Dexamethasone, and Manual therapy  PLAN FOR NEXT SESSION:Continue with corner balance activities, Additional ROM/stretching for shoulder/neck. Balance activities as indicated    Anette Guarneri, Adeline, DPT 12/12/22 10:51 AM  [facility] at [facility] [address] [place], [address] [place],  [place] [address] Phone # [phone] Fax # [phone]

## 2022-12-16 ENCOUNTER — Encounter: Payer: Self-pay | Admitting: Internal Medicine

## 2022-12-16 ENCOUNTER — Ambulatory Visit (INDEPENDENT_AMBULATORY_CARE_PROVIDER_SITE_OTHER): Payer: Medicare Other | Admitting: Internal Medicine

## 2022-12-16 VITALS — BP 128/76 | HR 75 | Temp 98.3°F | Ht 66.0 in | Wt 222.0 lb

## 2022-12-16 DIAGNOSIS — I10 Essential (primary) hypertension: Secondary | ICD-10-CM | POA: Diagnosis not present

## 2022-12-16 DIAGNOSIS — K5900 Constipation, unspecified: Secondary | ICD-10-CM | POA: Diagnosis not present

## 2022-12-16 DIAGNOSIS — E559 Vitamin D deficiency, unspecified: Secondary | ICD-10-CM

## 2022-12-16 DIAGNOSIS — N179 Acute kidney failure, unspecified: Secondary | ICD-10-CM | POA: Insufficient documentation

## 2022-12-16 DIAGNOSIS — R8290 Unspecified abnormal findings in urine: Secondary | ICD-10-CM | POA: Diagnosis not present

## 2022-12-16 DIAGNOSIS — N1831 Chronic kidney disease, stage 3a: Secondary | ICD-10-CM | POA: Diagnosis not present

## 2022-12-16 DIAGNOSIS — R7303 Prediabetes: Secondary | ICD-10-CM | POA: Diagnosis not present

## 2022-12-16 LAB — URINALYSIS, ROUTINE W REFLEX MICROSCOPIC
Ketones, ur: NEGATIVE
Leukocytes,Ua: NEGATIVE
Nitrite: NEGATIVE
Specific Gravity, Urine: 1.025 (ref 1.000–1.030)
Total Protein, Urine: NEGATIVE
Urine Glucose: NEGATIVE
Urobilinogen, UA: 0.2 (ref 0.0–1.0)
pH: 6 (ref 5.0–8.0)

## 2022-12-16 LAB — BASIC METABOLIC PANEL
BUN: 19 mg/dL (ref 6–23)
CO2: 27 mEq/L (ref 19–32)
Calcium: 9.9 mg/dL (ref 8.4–10.5)
Chloride: 105 mEq/L (ref 96–112)
Creatinine, Ser: 1.02 mg/dL (ref 0.40–1.20)
GFR: 50.24 mL/min — ABNORMAL LOW (ref >60.00)
Glucose, Bld: 93 mg/dL (ref 70–99)
Potassium: 3.4 mEq/L — ABNORMAL LOW (ref 3.5–5.1)
Sodium: 140 mEq/L (ref 135–145)

## 2022-12-16 NOTE — Progress Notes (Unsigned)
Patient ID: [name], female   DOB: 12-25-1935, 87 y.o.   MRN: [id number]        Chief Complaint: follow up HTN, HLD and hyperglycemia ***       HPI:  [name] is a 87 y.o. female here with c/o        Wt Readings from Last 3 Encounters:  12/16/22 222 lb (100.7 kg)  12/09/22 225 lb (102.1 kg)  12/05/22 227 lb (103 kg)   BP Readings from Last 3 Encounters:  12/16/22 128/76  12/09/22 118/82  12/05/22 118/82         Past Medical History:  Diagnosis Date   Allergy    Anxiety    Arthritis    no cartlidge in knees   Asthma    Bradycardia    a. atenolol stopped due to this, HR 40s.   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  CAD s/p RCA stent 2000 with residual mild-mod disease of left system 2001.   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 Her smoking use included cigarettes. She started smoking about 64 years ago. She has a 4 pack-year smoking history. She has never used smokeless tobacco. She reports that she does not drink alcohol and does not use  drugs. family history includes Asthma in her sister; Heart attack in her brother and father; Heart disease in her brother and father; Rectal cancer in her father. Allergies  Allergen Reactions   Crestor [Rosuvastatin Calcium]     Severe liver function problems   Hydrocodone-Acetaminophen Anxiety and Itching   Lidocaine Hcl Other (See Comments)    Novacaine:  Becomes very shaky   Lipitor [Atorvastatin Calcium] Other (See Comments)    Elevated liver function    Procaine Other (See Comments)    shaking   Rosuvastatin Other (See Comments)    Severe liver function problems   Theophylline Other (See Comments)    Becomes very shaky skaking skaking   Vicodin [Hydrocodone-Acetaminophen] Itching    Itching all over   Codeine Nausea Only, Anxiety and Other (See Comments)    Also complained of dizziness.  Has same problems with oxycodone and hydrocodone Visual Disturbance, Balance Difficulty, Dizzy "Room Spinning; "Swimming" Balance, vision, nausea, dizzy, "swimming", "room spinning" Balance, vision, nausea, dizzy, "swimming", "room spinning"   Ephedrine Other (See Comments)    shaking   Other Other (See Comments)  Quinine causes nausea, dizzy   Oxycodone Other (See Comments)    Balance, vision, nausea, dizzy, "swimming", "room spinning"   Oxycodone-Acetaminophen Nausea Only, Anxiety and Other (See Comments)    Visual Disturbance, Balance Difficulty, Dizzy "Room Spinning; "Swimming"   Propoxyphene Anxiety, Nausea Only and Other (See Comments)    Visual Disturbance, Balance Difficulty, Dizzy "Room Spinning; "Swimming" Balance, vision, nausea, dizzy, "swimming", "room spinning" Balance, vision, nausea, dizzy, "swimming, "room spinning"   Quinine Derivatives Nausea Only    dizzy   Sulfa Antibiotics Nausea Only    Also complained of dizziness   Epinephrine Other (See Comments)    Patient becomes "shaky" shaking shaking   Penicillins Rash    Pt reported all over body rash in 1958 Has  patient had a PCN reaction causing immediate rash, facial/tongue/throat swelling, SOB or lightheadedness with hypotension:Yes Has patient had a PCN reaction causing severe rash involving mucus membranes or skin necrosis: No Has patient had a PCN reaction that required hospitalization: No Has patient had a PCN reaction occurring within the last 10 years:No     Quinine Other (See Comments)   Theophyllines Other (See Comments)    Patient becomes "shaky"   Current Outpatient Medications on File Prior to Visit  Medication Sig Dispense Refill   albuterol (PROVENTIL) (2.5 MG/3ML) 0.083% nebulizer solution Take 3 mLs (2.5 mg total) by nebulization every 6 (six) hours as needed for wheezing or shortness of breath. 75 mL 12   allopurinol (ZYLOPRIM) 300 MG tablet Take 300 mg by mouth daily.     ALPRAZolam (XANAX) 0.5 MG tablet TAKE 1 TO 2 TABLETS BY MOUTH TWICE DAILY AS NEEDED FOR ANXIETY OR SLEEP 60 tablet 2   AMBULATORY NON FORMULARY MEDICATION Medication Name: Prednisone Injection in knee     azelastine (ASTELIN) 0.1 % nasal spray Place 2 sprays into both nostrils daily. Use in each nostril as directed (Patient taking differently: Place 2 sprays into both nostrils in the morning.) 30 mL 5   bisacodyl (DULCOLAX) 10 MG suppository Place 1 suppository (10 mg total) rectally as needed for moderate constipation. 12 suppository 0   budesonide-formoterol (SYMBICORT) 80-4.5 MCG/ACT inhaler 2 puffs Inhalation Twice a day     co-enzyme Q-10 50 MG capsule Take 100 mg by mouth daily.     colchicine 0.6 MG tablet Take 0.6 mg by mouth daily as needed (flare  up).     cycloSPORINE (RESTASIS) 0.05 % ophthalmic emulsion Apply 1 drop to eye 2 (two) times daily as needed (allergies).     EPINEPHrine 0.3 mg/0.3 mL IJ SOAJ injection Inject 0.3 mg into the muscle as needed for anaphylaxis.     Evolocumab with Infusor (REPATHA PUSHTRONEX SYSTEM) 420 MG/3.5ML SOCT INJECT 1 UNIT INTO THE SKIN EVERY 30 DAYS 3.5 mL 2    ezetimibe (ZETIA) 10 MG tablet Take 1 tablet (10 mg total) by mouth daily. Take on Tues & Fri 90 tablet 3   fexofenadine (ALLERGY RELIEF) 180 MG tablet Take 1 tablet (180 mg total) by mouth daily. 90 tablet 5   fluticasone (FLONASE) 50 MCG/ACT nasal spray Place 1 spray into both nostrils every morning.     fluticasone-salmeterol (ADVAIR DISKUS) 500-50 MCG/ACT AEPB 1 puff Inhalation Twice a day     fluticasone-salmeterol (ADVAIR) 500-50 MCG/ACT AEPB Inhale 1 puff into the lungs in the morning and at bedtime.     gabapentin (NEURONTIN) 300 MG capsule Take 1 capsule (300 mg total) by mouth at bedtime. 90 capsule 1   Homeopathic  Products (ARNICARE) GEL Apply 1 application  topically daily as needed (soreness).     hydrochlorothiazide (HYDRODIURIL) 25 MG tablet TAKE 2 TABLETS(50 MG) BY MOUTH DAILY 180 tablet 1   levalbuterol (XOPENEX HFA) 45 MCG/ACT inhaler Inhale 2 puffs into the lungs every 6 (six) hours as needed for wheezing or shortness of breath. 3 Inhaler 3   levocetirizine (XYZAL) 5 MG tablet Take 1 tablet (5 mg total) by mouth every evening. 30 tablet 11   levothyroxine (SYNTHROID) 75 MCG tablet Take 1 tablet (75 mcg total) by mouth daily before breakfast. 90 tablet 1   metoprolol succinate (TOPROL-XL) 25 MG 24 hr tablet TAKE 1/2 TABLET BY MOUTH EVERY DAY 45 tablet 0   mirtazapine (REMERON) 15 MG tablet TAKE 1 TABLET(15 MG) BY MOUTH AT BEDTIME Please call in September 2024 to make an November 2024 appointment for more refills 90 tablet 1   montelukast (SINGULAIR) 10 MG tablet Take 1 tablet (10 mg total) by mouth at bedtime. 90 tablet 1   ondansetron (ZOFRAN) 4 MG tablet Take 1 tablet (4 mg total) by mouth every 8 (eight) hours as needed for nausea or vomiting. 40 tablet 1   pantoprazole (PROTONIX) 40 MG tablet Take 1 tablet (40 mg total) by mouth daily. 30 tablet 4   potassium chloride SA (KLOR-CON M) 20 MEQ tablet TAKE 1 TABLET BY MOUTH DAILY 90 tablet 3   rosuvastatin (CRESTOR) 10 MG tablet 1  tablet Orally Twice a week     traMADol (ULTRAM) 50 MG tablet Take 0.5-1 tablets (25-50 mg total) by mouth every 6 (six) hours as needed. 120 tablet 2   triamcinolone cream (KENALOG) 0.1 % Apply 1 Application topically 2 (two) times daily. 453.6 g 0   valACYclovir (VALTREX) 1000 MG tablet Take 2,000 mg by mouth 2 (two) times daily.     XARELTO 20 MG TABS tablet TAKE 1 TABLET(20 MG) BY MOUTH DAILY WITH SUPPER 90 tablet 1   No current facility-administered medications on file prior to visit.        ROS:  All others reviewed and negative.  Objective        PE:  BP 128/76 (BP Location: Left Arm, Patient Position: Sitting, Cuff Size: Normal)   Pulse 75   Temp 98.3 F (36.8 C) (Oral)   Ht 5\' 6"  (1.676 m)   Wt 222 lb (100.7 kg)   SpO2 100%   BMI 35.83 kg/m                 Constitutional: Pt appears in NAD               HENT: Head: NCAT.                Right Ear: External ear normal.                 Left Ear: External ear normal.                Eyes: . Pupils are equal, round, and reactive to light. Conjunctivae and EOM are normal               Nose: without d/c or deformity               Neck: Neck supple. Gross normal ROM               Cardiovascular: Normal rate and regular rhythm.  Pulmonary/Chest: Effort normal and breath sounds without rales or wheezing.                Abd:  Soft, NT, ND, + BS, no organomegaly               Neurological: Pt is alert. At baseline orientation, motor grossly intact               Skin: Skin is warm. No rashes, no other new lesions, LE edema - ***               Psychiatric: Pt behavior is normal without agitation   Micro: none  Cardiac tracings I have personally interpreted today:  none  Pertinent Radiological findings (summarize): none   Lab Results  Component Value Date   WBC 7.2 12/05/2022   HGB 12.8 12/05/2022   HCT 40.0 12/05/2022   PLT 153.0 12/05/2022   GLUCOSE 104 (H) 12/05/2022   CHOL 198 11/25/2022   TRIG 71.0  11/25/2022   HDL 70.80 11/25/2022   LDLDIRECT 107.0 09/17/2016   LDLCALC 113 (H) 11/25/2022   ALT 13 12/05/2022   AST 20 12/05/2022   NA 140 12/05/2022   K 3.6 12/05/2022   CL 100 12/05/2022   CREATININE 1.20 12/05/2022   BUN 26 (H) 12/05/2022   CO2 33 (H) 12/05/2022   TSH 2.15 11/25/2022   INR 1.5 (H) 10/15/2022   HGBA1C 5.6 11/25/2022   MICROALBUR 1.7 05/21/2022   Assessment/Plan:  [name] is a 87 y.o. 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  No problem-specific Assessment & Plan notes found for this encounter.  Followup: No follow-ups on file.  Oliver Barre, MD 12/16/2022 10:31 AM [facility]      Chief Complaint: follow up HTN, HLD and hyperglycemia ***       HPI:  [name] is a 87 y.o. female here with c/o        Wt Readings from Last 3 Encounters:  12/16/22 222 lb (100.7 kg)  12/09/22 225 lb (102.1 kg)  12/05/22 227 lb (103 kg)   BP Readings from Last 3 Encounters:  12/16/22 128/76  12/09/22 118/82  12/05/22 118/82         Past Medical History:  Diagnosis Date   Allergy    Anxiety    Arthritis    no cartlidge in knees   Asthma    Bradycardia    a. atenolol stopped due to this, HR 40s.   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  CAD s/p RCA stent 2000 with residual mild-mod disease of left  system 2001.   Chronic atrial fibrillation (HCC)    COPD (chronic obstructive pulmonary disease) (HCC)    Essential hypertension 03/24/2015   Gout    post operative   Hx of radiation therapy 04/02/11 -05/20/11   left breast   Hyperlipidemia    Hypothyroidism 03/24/2015   Incontinence of urine    Malignant neoplasm  of upper-outer quadrant of female breast (HCC) 12/26/2010   Osteoporosis 03/24/2015   Peripheral neuropathy 03/24/2015   Plantar fasciitis    Sleep apnea    Past Surgical History:  Procedure Laterality Date   BREAST LUMPECTOMY W/ NEEDLE LOCALIZATION  01/29/2011   Left with [facility] Dr Jamey Ripa   CHOLECYSTECTOMY  1955   CORONARY ANGIOPLASTY WITH STENT PLACEMENT  2000   DILATION AND CURETTAGE OF UTERUS  1976   and 1996   HAND SURGERY  1992   tendons between thumb and forefinger   SKIN BIOPSY     1994, 2006, 2008, 2010, 2011, 2011, 2012-  pre-cancerous   TONSILLECTOMY  1955    reports that she quit smoking about 60 years ago. Her smoking use included cigarettes. She started smoking about 64 years ago. She has a 4 pack-year smoking history. She has never used smokeless tobacco. She reports that she does not drink alcohol and does not use drugs. family history includes Asthma in her sister; Heart attack in her brother and father; Heart disease in her brother and father; Rectal cancer in her father. Allergies  Allergen Reactions   Crestor [Rosuvastatin Calcium]     Severe liver function problems   Hydrocodone-Acetaminophen Anxiety and Itching   Lidocaine Hcl Other (See Comments)    Novacaine:  Becomes very shaky   Lipitor [Atorvastatin Calcium] Other (See Comments)    Elevated liver function    Procaine Other (See Comments)    shaking   Rosuvastatin Other (See Comments)    Severe liver function problems   Theophylline Other (See Comments)    Becomes very shaky skaking skaking   Vicodin [Hydrocodone-Acetaminophen] Itching    Itching all over   Codeine Nausea Only,  Anxiety and Other (See Comments)    Also complained of dizziness.  Has same problems with oxycodone and hydrocodone Visual Disturbance, Balance Difficulty, Dizzy "Room Spinning; "Swimming" Balance, vision, nausea, dizzy, "swimming", "room spinning" Balance, vision, nausea, dizzy, "swimming", "room spinning"   Ephedrine Other (See Comments)    shaking   Other Other (See Comments)    Quinine causes nausea, dizzy   Oxycodone Other (See Comments)    Balance, vision, nausea, dizzy, "swimming", "room spinning"   Oxycodone-Acetaminophen Nausea Only, Anxiety and Other (See Comments)    Visual Disturbance, Balance Difficulty, Dizzy "Room Spinning; "Swimming"   Propoxyphene Anxiety, Nausea Only and Other (See Comments)    Visual Disturbance, Balance Difficulty, Dizzy "Room Spinning; "Swimming" Balance, vision, nausea, dizzy, "swimming", "room spinning" Balance, vision, nausea, dizzy, "swimming, "room spinning"   Quinine Derivatives Nausea Only    dizzy   Sulfa Antibiotics Nausea Only    Also complained of dizziness   Epinephrine Other (See Comments)    Patient becomes "shaky" shaking shaking   Penicillins Rash    Pt reported all over body rash in 1958 Has patient had a PCN reaction causing immediate rash, facial/tongue/throat swelling, SOB or lightheadedness with hypotension:Yes Has patient had a PCN reaction causing severe rash involving mucus membranes or skin necrosis: No Has patient had a PCN reaction that required hospitalization: No Has patient had a PCN reaction occurring within the last 10 years:No     Quinine Other (See Comments)   Theophyllines Other (See Comments)    Patient becomes "shaky"   Current Outpatient Medications on File Prior to Visit  Medication Sig Dispense Refill   albuterol (PROVENTIL) (2.5 MG/3ML) 0.083% nebulizer solution Take 3 mLs (2.5 mg total) by nebulization every 6 (six) hours as needed for wheezing  or shortness of breath. 75 mL 12   allopurinol  (ZYLOPRIM) 300 MG tablet Take 300 mg by mouth daily.     ALPRAZolam (XANAX) 0.5 MG tablet TAKE 1 TO 2 TABLETS BY MOUTH TWICE DAILY AS NEEDED FOR ANXIETY OR SLEEP 60 tablet 2   AMBULATORY NON FORMULARY MEDICATION Medication Name: Prednisone Injection in knee     azelastine (ASTELIN) 0.1 % nasal spray Place 2 sprays into both nostrils daily. Use in each nostril as directed (Patient taking differently: Place 2 sprays into both nostrils in the morning.) 30 mL 5   bisacodyl (DULCOLAX) 10 MG suppository Place 1 suppository (10 mg total) rectally as needed for moderate constipation. 12 suppository 0   budesonide-formoterol (SYMBICORT) 80-4.5 MCG/ACT inhaler 2 puffs Inhalation Twice a day     co-enzyme Q-10 50 MG capsule Take 100 mg by mouth daily.     colchicine 0.6 MG tablet Take 0.6 mg by mouth daily as needed (flare  up).     cycloSPORINE (RESTASIS) 0.05 % ophthalmic emulsion Apply 1 drop to eye 2 (two) times daily as needed (allergies).     EPINEPHrine 0.3 mg/0.3 mL IJ SOAJ injection Inject 0.3 mg into the muscle as needed for anaphylaxis.     Evolocumab with Infusor (REPATHA PUSHTRONEX SYSTEM) 420 MG/3.5ML SOCT INJECT 1 UNIT INTO THE SKIN EVERY 30 DAYS 3.5 mL 2   ezetimibe (ZETIA) 10 MG tablet Take 1 tablet (10 mg total) by mouth daily. Take on Tues & Fri 90 tablet 3   fexofenadine (ALLERGY RELIEF) 180 MG tablet Take 1 tablet (180 mg total) by mouth daily. 90 tablet 5   fluticasone (FLONASE) 50 MCG/ACT nasal spray Place 1 spray into both nostrils every morning.     fluticasone-salmeterol (ADVAIR DISKUS) 500-50 MCG/ACT AEPB 1 puff Inhalation Twice a day     fluticasone-salmeterol (ADVAIR) 500-50 MCG/ACT AEPB Inhale 1 puff into the lungs in the morning and at bedtime.     gabapentin (NEURONTIN) 300 MG capsule Take 1 capsule (300 mg total) by mouth at bedtime. 90 capsule 1   Homeopathic Products (ARNICARE) GEL Apply 1 application  topically daily as needed (soreness).     hydrochlorothiazide  (HYDRODIURIL) 25 MG tablet TAKE 2 TABLETS(50 MG) BY MOUTH DAILY 180 tablet 1   levalbuterol (XOPENEX HFA) 45 MCG/ACT inhaler Inhale 2 puffs into the lungs every 6 (six) hours as needed for wheezing or shortness of breath. 3 Inhaler 3   levocetirizine (XYZAL) 5 MG tablet Take 1 tablet (5 mg total) by mouth every evening. 30 tablet 11   levothyroxine (SYNTHROID) 75 MCG tablet Take 1 tablet (75 mcg total) by mouth daily before breakfast. 90 tablet 1   metoprolol succinate (TOPROL-XL) 25 MG 24 hr tablet TAKE 1/2 TABLET BY MOUTH EVERY DAY 45 tablet 0   mirtazapine (REMERON) 15 MG tablet TAKE 1 TABLET(15 MG) BY MOUTH AT BEDTIME Please call in September 2024 to make an November 2024 appointment for more refills 90 tablet 1   montelukast (SINGULAIR) 10 MG tablet Take 1 tablet (10 mg total) by mouth at bedtime. 90 tablet 1   ondansetron (ZOFRAN) 4 MG tablet Take 1 tablet (4 mg total) by mouth every 8 (eight) hours as needed for nausea or vomiting. 40 tablet 1   pantoprazole (PROTONIX) 40 MG tablet Take 1 tablet (40 mg total) by mouth daily. 30 tablet 4   potassium chloride SA (KLOR-CON M) 20 MEQ tablet TAKE 1 TABLET BY MOUTH DAILY 90 tablet 3   rosuvastatin (  CRESTOR) 10 MG tablet 1 tablet Orally Twice a week     traMADol (ULTRAM) 50 MG tablet Take 0.5-1 tablets (25-50 mg total) by mouth every 6 (six) hours as needed. 120 tablet 2   triamcinolone cream (KENALOG) 0.1 % Apply 1 Application topically 2 (two) times daily. 453.6 g 0   valACYclovir (VALTREX) 1000 MG tablet Take 2,000 mg by mouth 2 (two) times daily.     XARELTO 20 MG TABS tablet TAKE 1 TABLET(20 MG) BY MOUTH DAILY WITH SUPPER 90 tablet 1   No current facility-administered medications on file prior to visit.        ROS:  All others reviewed and negative.  Objective        PE:  BP 128/76 (BP Location: Left Arm, Patient Position: Sitting, Cuff Size: Normal)   Pulse 75   Temp 98.3 F (36.8 C) (Oral)   Ht 5\' 6"  (1.676 m)   Wt 222 lb (100.7 kg)    SpO2 100%   BMI 35.83 kg/m                 Constitutional: Pt appears in NAD               HENT: Head: NCAT.                Right Ear: External ear normal.                 Left Ear: External ear normal.                Eyes: . Pupils are equal, round, and reactive to light. Conjunctivae and EOM are normal               Nose: without d/c or deformity               Neck: Neck supple. Gross normal ROM               Cardiovascular: Normal rate and regular rhythm.                 Pulmonary/Chest: Effort normal and breath sounds without rales or wheezing.                Abd:  Soft, NT, ND, + BS, no organomegaly               Neurological: Pt is alert. At baseline orientation, motor grossly intact               Skin: Skin is warm. No rashes, no other new lesions, LE edema - ***               Psychiatric: Pt behavior is normal without agitation   Micro: none  Cardiac tracings I have personally interpreted today:  none  Pertinent Radiological findings (summarize): none   Lab Results  Component Value Date   WBC 7.2 12/05/2022   HGB 12.8 12/05/2022   HCT 40.0 12/05/2022   PLT 153.0 12/05/2022   GLUCOSE 104 (H) 12/05/2022   CHOL 198 11/25/2022   TRIG 71.0 11/25/2022   HDL 70.80 11/25/2022   LDLDIRECT 107.0 09/17/2016   LDLCALC 113 (H) 11/25/2022   ALT 13 12/05/2022   AST 20 12/05/2022   NA 140 12/05/2022   K 3.6 12/05/2022   CL 100 12/05/2022   CREATININE 1.20 12/05/2022   BUN 26 (H) 12/05/2022   CO2 33 (H) 12/05/2022   TSH 2.15 11/25/2022   INR 1.5 (H) 10/15/2022  HGBA1C 5.6 11/25/2022   MICROALBUR 1.7 05/21/2022   Assessment/Plan:  [name] is a 87 y.o. 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  No problem-specific Assessment & Plan notes found for this encounter.  Followup: No follow-ups on file.  Oliver Barre, MD 12/16/2022 10:31 AM [facility]

## 2022-12-16 NOTE — Patient Instructions (Signed)
Please continue all other medications as before, and refills have been done if requested.  Please have the pharmacy call with any other refills you may need.  Please continue your efforts at being more active, low cholesterol diet, and weight control.  You are otherwise up to date with prevention measures today.  Please keep your appointments with your specialists as you may have planned  Please go to the LAB at the blood drawing area for the tests to be done  You will be contacted by phone if any changes need to be made immediately.  Otherwise, you will receive a letter about your results with an explanation, but please check with MyChart first.

## 2022-12-17 ENCOUNTER — Encounter: Payer: Self-pay | Admitting: Internal Medicine

## 2022-12-17 ENCOUNTER — Ambulatory Visit: Payer: Medicare Other | Admitting: Physical Therapy

## 2022-12-17 ENCOUNTER — Ambulatory Visit: Payer: Medicare Other

## 2022-12-17 ENCOUNTER — Encounter: Payer: Self-pay | Admitting: Physical Therapy

## 2022-12-17 DIAGNOSIS — M6281 Muscle weakness (generalized): Secondary | ICD-10-CM

## 2022-12-17 DIAGNOSIS — R2681 Unsteadiness on feet: Secondary | ICD-10-CM

## 2022-12-17 DIAGNOSIS — M542 Cervicalgia: Secondary | ICD-10-CM

## 2022-12-17 DIAGNOSIS — R42 Dizziness and giddiness: Secondary | ICD-10-CM

## 2022-12-17 DIAGNOSIS — R262 Difficulty in walking, not elsewhere classified: Secondary | ICD-10-CM

## 2022-12-17 NOTE — Assessment & Plan Note (Signed)
BP Readings from Last 3 Encounters:  12/16/22 128/76  12/09/22 118/82  12/05/22 118/82   Stable, pt to continue medical treatment toprol xl 25 every day, hct 50 qd

## 2022-12-17 NOTE — Assessment & Plan Note (Signed)
Also for f/u ua and culture

## 2022-12-17 NOTE — Assessment & Plan Note (Signed)
 Lab Results  Component Value Date   HGBA1C 5.6 11/25/2022   Stable, pt to continue current medical treatment  - diet, wt control

## 2022-12-17 NOTE — Assessment & Plan Note (Signed)
Last vitamin D Lab Results  Component Value Date   VD25OH 39.32 11/25/2022   Low, to start oral replacement

## 2022-12-17 NOTE — Assessment & Plan Note (Signed)
Improved at d/c, now for f/u lab

## 2022-12-17 NOTE — Therapy (Signed)
OUTPATIENT PHYSICAL THERAPY VESTIBULAR TREATMENT     Patient Name: [name] MRN: [id number] DOB:Sep 03, 1935, 87 y.o., female Today's Date: 12/17/2022    END OF SESSION:  PT End of Session - 12/17/22 1342     Visit Number 11    Number of Visits 16    Date for PT Re-Evaluation 12/31/22    Authorization Type [employer]    Progress Note Due on Visit 18    PT Start Time 1332   pt late   PT Stop Time 1357    PT Time Calculation (min) 25 min    Activity Tolerance Patient tolerated treatment well    Behavior During Therapy WFL for tasks assessed/performed                   Past Medical History:  Diagnosis Date   Allergy    Anxiety    Arthritis    no cartlidge in knees   Asthma    Bradycardia    a. atenolol stopped due to this, HR 40s.   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  CAD s/p RCA stent 2000 with residual mild-mod disease of left system 2001.   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   Osteoporosis 03/24/2015   Peripheral neuropathy 03/24/2015   Plantar fasciitis    Sleep apnea    Past Surgical History:  Procedure Laterality Date   BREAST LUMPECTOMY W/ NEEDLE LOCALIZATION  01/29/2011   Left with [facility] Dr Jamey Ripa   CHOLECYSTECTOMY  1955   CORONARY ANGIOPLASTY WITH STENT PLACEMENT  2000   DILATION AND CURETTAGE OF UTERUS  1976   and 1996   HAND SURGERY  1992   tendons between thumb and forefinger   SKIN BIOPSY     1994, 2006, 2008, 2010, 2011, 2011, 2012-  pre-cancerous   TONSILLECTOMY  1955   Patient Active Problem List   Diagnosis Date Noted   Acute renal failure  superimposed on chronic kidney disease (HCC) 12/16/2022   Cloudy urine 11/25/2022   Insomnia 07/27/2022   Vitamin D deficiency 07/27/2022   Pulmonary hypertension (HCC) 11/07/2021   Bronchiectasis without complication (HCC) 11/07/2021   Hepatic cirrhosis (HCC) 11/06/2021   ILD (interstitial lung disease) (HCC) 10/03/2021   Abnormal LFTs 09/28/2021   Weight loss 08/19/2021   Chronic respiratory failure with hypoxia (HCC) 08/19/2021   Debility 06/29/2021   Multifocal pneumonia 06/15/2021   Elevated troponin 06/12/2021   Febrile illness 05/22/2021   Anxiety state 10/23/2020   Pure hypercholesterolemia 10/23/2020   Urinary tract infectious disease 10/23/2020   Atherosclerosis of coronary artery 10/23/2020   Sinus node dysfunction (HCC) 10/23/2020   Mixed hyperlipidemia 10/23/2020   OSA on CPAP 10/23/2020   Fall 09/05/2020   Nausea 04/23/2020   Constipation 04/23/2020   Posterior uveitis, 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   Dysphagia 01/21/2020   CHF (congestive heart failure) (HCC) 11/23/2019   Exposure to COVID-19 virus 12/04/2018   Osteoarthritis 09/19/2018   Abscess of left leg 09/26/2017   Left leg cellulitis 09/18/2017   Whiplash 06/10/2017   Cough 05/29/2017  Concussion 05/29/2017   Hammer toes of both feet 12/19/2016   Allergic rhinitis 12/09/2016   Pedal edema 09/17/2016   Chest pain 07/18/2016   Gross hematuria 04/26/2016   Prediabetes 03/21/2016   Dysuria 12/07/2015   Fever blister 12/07/2015   Choroidal nevus, 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   Malignant neoplasm of left female breast (HCC) 10/10/2015   Anxiety and depression 06/30/2015   Encounter for well adult exam with abnormal findings 06/30/2015   Neck mass 03/24/2015   Hypothyroidism 03/24/2015   Gout 03/24/2015   Essential hypertension 03/24/2015   Asthma 03/24/2015   Peripheral neuropathy 03/24/2015   Osteoporosis 03/24/2015   Morbid obesity (HCC) 03/24/2015   Long term current use of anticoagulant therapy 03/16/2015    Lymphadenopathy, cervical 03/16/2015   Diarrhea 03/16/2015   Hyperbilirubinemia 03/16/2015   Coronary arteriosclerosis 09/22/2014   Hyperlipidemia 07/23/2013   Hypokalemia 02/18/2013   Elevated bilirubin 02/18/2013   Morbid obesity with BMI of 45.0-49.9, adult (HCC) 10/29/2012   Rotator cuff tear arthropathy 10/29/2012   DJD (degenerative joint disease) of knee 10/29/2012   Plantar fasciitis    Arthritis    Hx of radiation therapy    Atrial fibrillation, chronic (HCC) 01/21/2011   Malignant neoplasm of upper-outer quadrant of left breast in female, estrogen receptor positive (HCC) 12/26/2010   Breast cancer (ILC) Receptor + her2 _ 12/26/2010    PCP: [name], MD REFERRING PROVIDER: Rodolph Bong, MD  REFERRING DIAG:  S06.0X0D (ICD-10-CM) - Concussion without loss of consciousness, subsequent encounter    THERAPY DIAG:  Muscle weakness (generalized)  Unsteadiness on feet  Dizziness and giddiness  Neck pain  Difficulty in walking, not elsewhere classified  ONSET DATE: 10/15/22  Rationale for Evaluation and Treatment: Rehabilitation  SUBJECTIVE:   SUBJECTIVE STATEMENT:  Upset because the person who is driving me here was late, my concussion has been bothering me and it really drives my nerves up. I think we should focus on therapy instead of anything balance because she did that last time   Pt accompanied by: self  PERTINENT HISTORY: [name],  is a 87 y.o. female who presents for initial evaluation of a head injury and multiple contusions. MOI: She fell/rolled out of bed due to her nightgown being tangled in the bedding. She was transported via EMS to the [facility] ED and dx w/ a levator scapula hematoma. Today, pt c/o cont'd confusion, soreness in her neck, shoulders, and thoracic spine. Her daughter notes a "bump" on the back of pt's head.   PAIN:  Are you having pain? Yes: NPRS scale: 8/10 Pain location: "the whole neck"  Pain description:  ache Aggravating factors: worse today after stiffness in the bed today; activity, overhead movements  Relieving factors: extra strength tylenol  PRECAUTIONS: None  WEIGHT BEARING RESTRICTIONS: No  FALLS: Has patient fallen in last 6 months? Yes. Number of falls 1  LIVING ENVIRONMENT: Lives with: lives alone Lives in: House/apartment Stairs: Yes: Internal: 2-3 steps; can reach both and typically uses rollator , ramp for entering/exiting home Has following equipment at home: Dan Humphreys - 4 wheeled  PLOF: Independent with household mobility with device and Independent with community mobility with device  PATIENT GOALS: reduce pain  OBJECTIVE:     TODAY'S TREATMENT 12/17/22  TherEx  Upper trap stretches 2x30 seconds B  Levator stretch 2x30 seconds B Scalene stretch 2x30 seconds B Chin tucks x10 with 3 second holds Chin tucks x7 B + lateral flexion  12/12/22 Activity Comments  multidirectional stepping to targets Heavy 1 UE support required and short step length   alt toe tap on step   Required 1 fingertip support  romberg EO/EC 30" Mild sway EO, mod-severe sway with EC, requiring at least 1 fingertip support (able to safely self-stabilize with single fingertip)  Skilled palpation and monitoring of tissues during TPDN Palpation tender trigger pts evident in B suboccipitals     Trigger Point Dry-Needling  Treatment instructions: Expect mild to moderate muscle soreness. S/S of pneumothorax if dry needled over a lung field, and to seek immediate medical attention should they occur. Patient verbalized understanding of these instructions and education.  Patient Consent Given: Yes Education handout provided: Previously provided Muscles treated: B suboccipitals using standard technique  Electrical stimulation performed: No Parameters: N/A Treatment response/outcome: tolerated well     HOME EXERCISE PROGRAM Last updated: 12/12/22 Access Code: [email or web address] URL:  [email or web address] Date: 12/12/2022 Prepared by: [facility] - Outpatient  Rehab - [facility]  Exercises - Seated Cervical Sidebending Stretch  - 1 x daily - 7 x weekly - 3 sets - 30-60 sec hold - Seated Upper Trap Stretch  - 1 x daily - 7 x weekly - 3 sets - 30-60 sec hold - Cervical Retraction with Overpressure  - 1 x daily - 5 x weekly - 2 sets - 10 reps - 3 sec hold - Seated Assisted Cervical Rotation with Towel  - 1 x daily - 5 x weekly - 2 sets - 10 reps - Mid-Lower Cervical Extension SNAG with Strap  - 1 x daily - 5 x weekly - 2 sets - 10 reps - Romberg Stance with Eyes Closed  - 1 x daily - 5 x weekly - 2 sets - 30 sec hold - Romberg Stance with Head Nods  - 1 x daily - 5 x weekly - 2 sets - 30 sec hold - Supine Cervical Retraction with Towel  - 1 x daily - 7 x weekly - 2 sets - 5 reps - 3 sec hold - Standing Toe Taps  - 1 x daily - 5 x weekly - 2 sets - 10 reps - Corner Balance Feet Together With Eyes Open  - 1 x daily - 5 x weekly - 2 sets - 30 sec hold - Corner Balance Feet Together With Eyes Closed  - 1 x daily - 5 x weekly - 2 sets - 30 sec hold   PATIENT EDUCATION: Education details: HEP update with edu for safety; edu on DN Person educated: Patient Education method: Explanation, Demonstration, Tactile cues, Verbal cues, and Handouts Education comprehension: verbalized understanding and returned demonstration     Below measures were taken at time of initial evaluation unless otherwise specified:   DIAGNOSTIC FINDINGS:  IMPRESSION: No acute bony abnormality. Degenerative disc and facet disease throughout the cervical spine.   Enlargement of the left levator scapulae muscle with surrounding stranding most compatible with intramuscular hematoma.  COGNITION: Overall cognitive status: Within functional limits for tasks assessed   SENSATION: WNL  EDEMA:    MUSCLE TONE:  WNL   POSTURE:  No Significant postural limitations  Cervical  ROM:    Active A/PROM (deg) eval  Flexion 50  Extension 40  Right lateral flexion 15  Left lateral flexion 15  Right rotation 15  Left rotation 20  (Blank rows = not tested)  UE ROM: WFL--able to elevate arms overhead full ROM but with discomfort in LUE  STRENGTH: 5/5 gross BUE  PALPATION:  multiple areas of tenderness/trigger points throughout left shoulder girdle and neck--more soft tissue than joint discomfort of the area  BED MOBILITY:  independent  TRANSFERS: Assistive device utilized: Environmental consultant - 4 wheeled  Sit to stand: Modified independence Stand to sit: Modified independence Chair to chair: Modified independence Floor:  NT  RAMP: Modified independence  CURB: Modified independence  GAIT: Gait pattern: WFL Distance walked:  Assistive device utilized: Environmental consultant - 4 wheeled Level of assistance: Modified independence Comments:   FUNCTIONAL TESTS:  Timed up and go (TUG): 23 sec w/ rollator Dynamic Gait Index: 13/24  M-CTSIB  Condition 1: Firm Surface, EO 30 Sec, Normal and Mild Sway  Condition 2: Firm Surface, EC 10 Sec, Moderate and Severe Sway  Condition 3: Foam Surface, EO 30 Sec, Normal Sway  Condition 4: Foam Surface, EC 4 Sec, Severe Sway        GOALS: Goals reviewed with patient? Yes  SHORT TERM GOALS: Target date: 11/15/2022    Patient will be independent in HEP to improve functional outcomes Baseline: Goal status: IN PROGRESS    LONG TERM GOALS: Target date: 11/29/2022    Exhibit improved C-spine ROM to 25 degrees lateral flexion and 30+ degrees rotation to improve comfort and safety when driving Baseline:  Flexion: 50 deg Extension: 40 Right lateral flexion: 30  Left lateral: 15-20 Right rotation: 15 deg Left rotation: 25 deg Goal status: IN PROGRESS  2.  Report left shoulder pain not exceeding 3/10 with overhead movements to improve comfort with household activity and self-care tasks Baseline: 2-3/10 Goal status: MET  3.  Demo  reduced risk for falls per time 15 sec TUG test Baseline: 23 sec w/ rollator; (12/03/22) 21 sec w/ rollator Goal status: IN PROGRESS  4.  Improved postural stability per mild sway conditions 2 and 4 M-CTSIB x 15 sec to improve safety with ADL Baseline: severe Goal status: IN PROGRESS    ASSESSMENT:  CLINICAL IMPRESSION:  Ms. Longcore arrives today very anxious, we focused on her neck pain and stiffness per her request, needed moderate redirection and reassurance throughout session. Did well, she is really happy with her progress in PT. Will continue to progress as able.   OBJECTIVE IMPAIRMENTS: decreased activity tolerance, decreased balance, decreased ROM, increased muscle spasms, impaired flexibility, and pain.   ACTIVITY LIMITATIONS: carrying, bending, sleeping, dressing, and reach over head  PARTICIPATION LIMITATIONS: meal prep, cleaning, laundry, driving, shopping, and community activity  PERSONAL FACTORS: Time since onset of injury/illness/exacerbation and 1-2 comorbidities: knee OA  are also affecting patient's functional outcome.   REHAB POTENTIAL: Excellent  CLINICAL DECISION MAKING: Stable/uncomplicated  EVALUATION COMPLEXITY: Low   PLAN:  PT FREQUENCY: 1-2x/week  PT DURATION: 4 weeks  PLANNED INTERVENTIONS: Therapeutic exercises, Therapeutic activity, Neuromuscular re-education, Balance training, Gait training, Patient/Family education, Self Care, Joint mobilization, Stair training, Vestibular training, Canalith repositioning, DME instructions, Aquatic Therapy, Dry Needling, Electrical stimulation, Spinal mobilization, Cryotherapy, Moist heat, Traction, Ultrasound, Ionotophoresis 4mg /ml Dexamethasone, and Manual therapy  PLAN FOR NEXT SESSION:Continue with corner balance activities, Additional ROM/stretching for shoulder/neck. Balance activities as indicated, consider DN again?     Nedra Hai, PT, DPT 12/17/22 1:59 PM  [facility] [address] [place], [address] [place], [place] [address] Phone # [phone] Fax # [phone]

## 2022-12-17 NOTE — Assessment & Plan Note (Signed)
Recent flare now improved with miralax and stool softner

## 2022-12-18 ENCOUNTER — Telehealth: Payer: Self-pay | Admitting: Emergency Medicine

## 2022-12-18 NOTE — Telephone Encounter (Signed)
Patient is returning phone call. Patient phone number is [phone].

## 2022-12-18 NOTE — Telephone Encounter (Signed)
Patient states needs oxygen and equipment to be picked up. Patient no longer uses oxygen. Patient phone number is [phone].

## 2022-12-19 ENCOUNTER — Ambulatory Visit: Payer: Medicare Other | Admitting: Physical Therapy

## 2022-12-19 ENCOUNTER — Telehealth: Payer: Self-pay | Admitting: Internal Medicine

## 2022-12-19 NOTE — Telephone Encounter (Signed)
Patient is concerned that the neurologist she was sent to is not helping her recover from her concussion. She would like a call back from a nurse to discuss other options. Best callback is [phone].

## 2022-12-19 NOTE — Telephone Encounter (Signed)
D/C order was sent to [employer] in July. [employer] gave confirmation on 7/3. Contacted [employer] and spoke with Dolanda who again confirmed they had the D/C order and [employer] would be reaching out to the patient to pick up equipment. Spoke with patient and informed of this information. Nothing further needed.

## 2022-12-20 ENCOUNTER — Encounter: Payer: Medicare Other | Admitting: Physical Therapy

## 2022-12-24 ENCOUNTER — Ambulatory Visit: Payer: Medicare Other

## 2022-12-24 DIAGNOSIS — R42 Dizziness and giddiness: Secondary | ICD-10-CM

## 2022-12-24 DIAGNOSIS — M542 Cervicalgia: Secondary | ICD-10-CM

## 2022-12-24 DIAGNOSIS — R2681 Unsteadiness on feet: Secondary | ICD-10-CM

## 2022-12-24 DIAGNOSIS — M6281 Muscle weakness (generalized): Secondary | ICD-10-CM | POA: Diagnosis not present

## 2022-12-24 DIAGNOSIS — R262 Difficulty in walking, not elsewhere classified: Secondary | ICD-10-CM | POA: Diagnosis not present

## 2022-12-24 NOTE — Therapy (Signed)
OUTPATIENT PHYSICAL THERAPY VESTIBULAR TREATMENT     Patient Name: [name] MRN: [id number] DOB:Nov 14, 1935, 87 y.o., female Today's Date: 12/24/2022    END OF SESSION:  PT End of Session - 12/24/22 1444     Visit Number 12    Number of Visits 16    Date for PT Re-Evaluation 12/31/22    Authorization Type [employer]    Progress Note Due on Visit 18    PT Start Time 1445    PT Stop Time 1530    PT Time Calculation (min) 45 min    Activity Tolerance Patient tolerated treatment well    Behavior During Therapy WFL for tasks assessed/performed                   Past Medical History:  Diagnosis Date   Allergy    Anxiety    Arthritis    no cartlidge in knees   Asthma    Bradycardia    a. atenolol stopped due to this, HR 40s.   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  CAD s/p RCA stent 2000 with residual mild-mod disease of left system 2001.   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   Osteoporosis 03/24/2015   Peripheral neuropathy 03/24/2015   Plantar fasciitis    Sleep apnea    Past Surgical History:  Procedure Laterality Date   BREAST LUMPECTOMY W/ NEEDLE LOCALIZATION  01/29/2011   Left with [facility] Dr Jamey Ripa   CHOLECYSTECTOMY  1955   CORONARY ANGIOPLASTY WITH STENT PLACEMENT  2000   DILATION AND CURETTAGE OF UTERUS  1976   and 1996   HAND SURGERY  1992   tendons between thumb and forefinger   SKIN BIOPSY     1994, 2006, 2008, 2010, 2011, 2011, 2012-  pre-cancerous   TONSILLECTOMY  1955   Patient Active Problem List   Diagnosis Date Noted   Acute renal failure superimposed  on chronic kidney disease (HCC) 12/16/2022   Cloudy urine 11/25/2022   Insomnia 07/27/2022   Vitamin D deficiency 07/27/2022   Pulmonary hypertension (HCC) 11/07/2021   Bronchiectasis without complication (HCC) 11/07/2021   Hepatic cirrhosis (HCC) 11/06/2021   ILD (interstitial lung disease) (HCC) 10/03/2021   Abnormal LFTs 09/28/2021   Weight loss 08/19/2021   Chronic respiratory failure with hypoxia (HCC) 08/19/2021   Debility 06/29/2021   Multifocal pneumonia 06/15/2021   Elevated troponin 06/12/2021   Febrile illness 05/22/2021   Anxiety state 10/23/2020   Pure hypercholesterolemia 10/23/2020   Urinary tract infectious disease 10/23/2020   Atherosclerosis of coronary artery 10/23/2020   Sinus node dysfunction (HCC) 10/23/2020   Mixed hyperlipidemia 10/23/2020   OSA on CPAP 10/23/2020   Fall 09/05/2020   Nausea 04/23/2020   Constipation 04/23/2020   Posterior uveitis, 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   Dysphagia 01/21/2020   CHF (congestive heart failure) (HCC) 11/23/2019   Exposure to COVID-19 virus 12/04/2018   Osteoarthritis 09/19/2018   Abscess of left leg 09/26/2017   Left leg cellulitis 09/18/2017   Whiplash 06/10/2017   Cough 05/29/2017   Concussion  05/29/2017   Hammer toes of both feet 12/19/2016   Allergic rhinitis 12/09/2016   Pedal edema 09/17/2016   Chest pain 07/18/2016   Gross hematuria 04/26/2016   Prediabetes 03/21/2016   Dysuria 12/07/2015   Fever blister 12/07/2015   Choroidal nevus, 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   Malignant neoplasm of left female breast (HCC) 10/10/2015   Anxiety and depression 06/30/2015   Encounter for well adult exam with abnormal findings 06/30/2015   Neck mass 03/24/2015   Hypothyroidism 03/24/2015   Gout 03/24/2015   Essential hypertension 03/24/2015   Asthma 03/24/2015   Peripheral neuropathy 03/24/2015   Osteoporosis 03/24/2015   Morbid obesity (HCC) 03/24/2015   Long term current use of anticoagulant therapy 03/16/2015    Lymphadenopathy, cervical 03/16/2015   Diarrhea 03/16/2015   Hyperbilirubinemia 03/16/2015   Coronary arteriosclerosis 09/22/2014   Hyperlipidemia 07/23/2013   Hypokalemia 02/18/2013   Elevated bilirubin 02/18/2013   Morbid obesity with BMI of 45.0-49.9, adult (HCC) 10/29/2012   Rotator cuff tear arthropathy 10/29/2012   DJD (degenerative joint disease) of knee 10/29/2012   Plantar fasciitis    Arthritis    Hx of radiation therapy    Atrial fibrillation, chronic (HCC) 01/21/2011   Malignant neoplasm of upper-outer quadrant of left breast in female, estrogen receptor positive (HCC) 12/26/2010   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 Pain is much less but still present along back of head and tight to shoulders.   Pt accompanied by: self  PERTINENT HISTORY: [name],  is a 87 y.o. female who presents for initial evaluation of a head injury and multiple contusions. MOI: She fell/rolled out of bed due to her nightgown being tangled in the bedding. She was transported via EMS to the [facility] ED and dx w/ a levator scapula hematoma. Today, pt c/o cont'd confusion, soreness in her neck, shoulders, and thoracic spine. Her daughter notes a "bump" on the back of pt's head.   PAIN:  Are you having pain? Yes: NPRS scale: 4/10 Pain location: "the whole neck"  Pain description: ache Aggravating factors: worse today after stiffness in the bed today; activity, overhead movements  Relieving factors: extra strength tylenol  PRECAUTIONS: None  WEIGHT BEARING  RESTRICTIONS: No  FALLS: Has patient fallen in last 6 months? Yes. Number of falls 1  LIVING ENVIRONMENT: Lives with: lives alone Lives in: House/apartment Stairs: Yes: Internal: 2-3 steps; can reach both and typically uses rollator , ramp for entering/exiting home Has following equipment at home: Dan Humphreys - 4 wheeled  PLOF: Independent with household mobility with device and Independent with community mobility with device  PATIENT GOALS: reduce pain  OBJECTIVE:    TODAY'S TREATMENT: 12/24/22 Activity Comments  Seated with MHP x 5 min   Cervical ext SNAG 10x Using MHP+pillowcase  Seated upper trap/levator stretch 2x60 sec   Able to raise LUE overhead without issue.   Seated ER+retraction RUE unable to ext rotate from previous issue?  Seated unilat row 1x10 Red band  Scap adduction 2x10 red  Manual therapy -cervical spine for decreased pain, spasm/guarding, and decreased ROM.  Post-tx  reports improved pain, decreased guarding, and demo greater rotation ROM       HOME EXERCISE PROGRAM Last updated: 12/12/22 Access Code: [email or web address] URL: [email or web address] Date: 12/12/2022 Prepared by: [facility] - Outpatient  Rehab - [facility]  Exercises - Seated Cervical Sidebending Stretch  - 1 x daily - 7 x weekly - 3 sets - 30-60 sec hold - Seated Upper Trap Stretch  - 1 x daily - 7 x weekly - 3 sets - 30-60 sec hold - Cervical Retraction with Overpressure  - 1 x daily - 5 x weekly - 2 sets - 10 reps - 3 sec hold - Seated Assisted Cervical Rotation with Towel  - 1 x daily - 5 x weekly - 2 sets - 10 reps - Mid-Lower Cervical Extension SNAG with Strap  - 1 x daily - 5 x weekly - 2 sets - 10 reps - Romberg Stance with Eyes Closed  - 1 x daily - 5 x weekly - 2 sets - 30 sec hold - Romberg Stance with Head Nods  - 1 x daily - 5 x weekly - 2 sets - 30 sec hold - Supine Cervical Retraction with Towel  - 1 x daily - 7 x weekly - 2 sets - 5 reps - 3 sec hold - Standing Toe Taps   - 1 x daily - 5 x weekly - 2 sets - 10 reps - Corner Balance Feet Together With Eyes Open  - 1 x daily - 5 x weekly - 2 sets - 30 sec hold - Corner Balance Feet Together With Eyes Closed  - 1 x daily - 5 x weekly - 2 sets - 30 sec hold - Shoulder External Rotation and Scapular Retraction with Resistance  - 1 x daily - 7 x weekly - 1-3 sets - 10 reps - Seated Shoulder Horizontal Abduction with Resistance  - 1 x daily - 7 x weekly - 3 sets - 10 reps  PATIENT EDUCATION: Education details: HEP update with edu for safety; edu on DN Person educated: Patient Education method: Explanation, Demonstration, Tactile cues, Verbal cues, and Handouts Education comprehension: verbalized understanding and returned demonstration     Below measures were taken at time of initial evaluation unless otherwise specified:   DIAGNOSTIC FINDINGS:  IMPRESSION: No acute bony abnormality. Degenerative disc and facet disease throughout the cervical spine.   Enlargement of the left levator scapulae muscle with surrounding stranding most compatible with intramuscular hematoma.  COGNITION: Overall cognitive status: Within functional limits for tasks assessed   SENSATION: WNL  EDEMA:    MUSCLE TONE:  WNL   POSTURE:  No Significant postural limitations  Cervical ROM:    Active A/PROM (deg) eval  Flexion 50  Extension 40  Right lateral flexion 15  Left lateral flexion 15  Right rotation 15  Left rotation 20  (Blank rows = not tested)  UE ROM: WFL--able to elevate arms overhead full ROM but with discomfort in LUE  STRENGTH: 5/5 gross BUE  PALPATION:  multiple areas of tenderness/trigger points throughout left shoulder girdle and neck--more soft tissue than joint discomfort of the area  BED MOBILITY:  independent  TRANSFERS: Assistive device utilized: Environmental consultant - 4 wheeled  Sit to stand: Modified independence Stand to sit: Modified independence Chair to chair: Modified independence Floor:   NT  RAMP: Modified independence  CURB: Modified independence  GAIT: Gait pattern: WFL Distance walked:  Assistive device utilized: Environmental consultant - 4 wheeled Level of assistance: Modified independence Comments:  FUNCTIONAL TESTS:  Timed up and go (TUG): 23 sec w/ rollator Dynamic Gait Index: 13/24  M-CTSIB  Condition 1: Firm Surface, EO 30 Sec, Normal and Mild Sway  Condition 2: Firm Surface, EC 10 Sec, Moderate and Severe Sway  Condition 3: Foam Surface, EO 30 Sec, Normal Sway  Condition 4: Foam Surface, EC 4 Sec, Severe Sway        GOALS: Goals reviewed with patient? Yes  SHORT TERM GOALS: Target date: 11/15/2022    Patient will be independent in HEP to improve functional outcomes Baseline: Goal status: IN PROGRESS    LONG TERM GOALS: Target date: 11/29/2022    Exhibit improved C-spine ROM to 25 degrees lateral flexion and 30+ degrees rotation to improve comfort and safety when driving Baseline:  Flexion: 50 deg Extension: 40 Right lateral flexion: 30  Left lateral: 15-20 Right rotation: 15 deg Left rotation: 25 deg Goal status: IN PROGRESS  2.  Report left shoulder pain not exceeding 3/10 with overhead movements to improve comfort with household activity and self-care tasks Baseline: 2-3/10 Goal status: MET  3.  Demo reduced risk for falls per time 15 sec TUG test Baseline: 23 sec w/ rollator; (12/03/22) 21 sec w/ rollator Goal status: IN PROGRESS  4.  Improved postural stability per mild sway conditions 2 and 4 M-CTSIB x 15 sec to improve safety with ADL Baseline: severe Goal status: IN PROGRESS    ASSESSMENT:  CLINICAL IMPRESSION: Pt reports increased neck pain over past few days. Able to elevate LUE overhead without pain and demo full ROM.  Continued with HEP development with addition of UE resistance to improve efficiency and reduce pain/guarding with active use.  Continued with static stretching to improve flexibility and reduce pain/guarding.   Manual therapy to reduce pain, guarding/spasm to improve rotation with good response to manual traction with improved pain-free ROM  OBJECTIVE IMPAIRMENTS: decreased activity tolerance, decreased balance, decreased ROM, increased muscle spasms, impaired flexibility, and pain.   ACTIVITY LIMITATIONS: carrying, bending, sleeping, dressing, and reach over head  PARTICIPATION LIMITATIONS: meal prep, cleaning, laundry, driving, shopping, and community activity  PERSONAL FACTORS: Time since onset of injury/illness/exacerbation and 1-2 comorbidities: knee OA  are also affecting patient's functional outcome.   REHAB POTENTIAL: Excellent  CLINICAL DECISION MAKING: Stable/uncomplicated  EVALUATION COMPLEXITY: Low   PLAN:  PT FREQUENCY: 1-2x/week  PT DURATION: 4 weeks  PLANNED INTERVENTIONS: Therapeutic exercises, Therapeutic activity, Neuromuscular re-education, Balance training, Gait training, Patient/Family education, Self Care, Joint mobilization, Stair training, Vestibular training, Canalith repositioning, DME instructions, Aquatic Therapy, Dry Needling, Electrical stimulation, Spinal mobilization, Cryotherapy, Moist heat, Traction, Ultrasound, Ionotophoresis 4mg /ml Dexamethasone, and Manual therapy  PLAN FOR NEXT SESSION:ROM measures before/after manual therapy    5:08 PM, 12/24/22 M. Shary Decamp, PT, DPT Physical Therapist- [place] Office Number: [phone]

## 2022-12-25 ENCOUNTER — Ambulatory Visit: Payer: Medicare Other | Admitting: Emergency Medicine

## 2022-12-26 ENCOUNTER — Ambulatory Visit: Payer: Medicare Other

## 2022-12-26 DIAGNOSIS — M6281 Muscle weakness (generalized): Secondary | ICD-10-CM | POA: Diagnosis not present

## 2022-12-26 DIAGNOSIS — M542 Cervicalgia: Secondary | ICD-10-CM

## 2022-12-26 DIAGNOSIS — R262 Difficulty in walking, not elsewhere classified: Secondary | ICD-10-CM

## 2022-12-26 DIAGNOSIS — R2681 Unsteadiness on feet: Secondary | ICD-10-CM

## 2022-12-26 DIAGNOSIS — R42 Dizziness and giddiness: Secondary | ICD-10-CM | POA: Diagnosis not present

## 2022-12-26 NOTE — Therapy (Signed)
OUTPATIENT PHYSICAL THERAPY VESTIBULAR TREATMENT     Patient Name: SONALI FISTLER MRN: [id number] DOB:[birth year], 87 y.o., female Today's Date: 12/26/2022    END OF SESSION:  PT End of Session - 12/26/22 1454     Visit Number 13    Number of Visits 16    Date for PT Re-Evaluation 12/31/22    Authorization Type [employer]    Progress Note Due on Visit 18    PT Start Time 1445    PT Stop Time 1530    PT Time Calculation (min) 45 min    Activity Tolerance Patient tolerated treatment well    Behavior During Therapy WFL for tasks assessed/performed                   Past Medical History:  Diagnosis Date   Allergy    Anxiety    Arthritis    no cartlidge in knees   Asthma    Bradycardia    a. atenolol stopped due to this, HR 40s.   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  CAD s/p RCA stent 2000 with residual mild-mod disease of left system 2001.   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   Osteoporosis 03/24/2015   Peripheral neuropathy 03/24/2015   Plantar fasciitis    Sleep apnea    Past Surgical History:  Procedure Laterality Date   BREAST LUMPECTOMY W/ NEEDLE LOCALIZATION  01/29/2011   Left with [facility] Dr Jamey Ripa   CHOLECYSTECTOMY  1955   CORONARY ANGIOPLASTY WITH STENT PLACEMENT  2000   DILATION AND CURETTAGE OF UTERUS  1976   and 1996   HAND SURGERY  1992   tendons between thumb and forefinger   SKIN BIOPSY     1994, 2006, 2008, 2010, 2011, 2011, 2012-  pre-cancerous   TONSILLECTOMY  1955   Patient Active Problem List   Diagnosis Date Noted   Acute renal failure superimposed  on chronic kidney disease (HCC) 12/16/2022   Cloudy urine 11/25/2022   Insomnia 07/27/2022   Vitamin D deficiency 07/27/2022   Pulmonary hypertension (HCC) 11/07/2021   Bronchiectasis without complication (HCC) 11/07/2021   Hepatic cirrhosis (HCC) 11/06/2021   ILD (interstitial lung disease) (HCC) 10/03/2021   Abnormal LFTs 09/28/2021   Weight loss 08/19/2021   Chronic respiratory failure with hypoxia (HCC) 08/19/2021   Debility 06/29/2021   Multifocal pneumonia 06/15/2021   Elevated troponin 06/12/2021   Febrile illness 05/22/2021   Anxiety state 10/23/2020   Pure hypercholesterolemia 10/23/2020   Urinary tract infectious disease 10/23/2020   Atherosclerosis of coronary artery 10/23/2020   Sinus node dysfunction (HCC) 10/23/2020   Mixed hyperlipidemia 10/23/2020   OSA on CPAP 10/23/2020   Fall 09/05/2020   Nausea 04/23/2020   Constipation 04/23/2020   Posterior uveitis, 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   Dysphagia 01/21/2020   CHF (congestive heart failure) (HCC) 11/23/2019   Exposure to COVID-19 virus 12/04/2018   Osteoarthritis 09/19/2018   Abscess of left leg 09/26/2017   Left leg cellulitis 09/18/2017   Whiplash 06/10/2017   Cough 05/29/2017   Concussion  05/29/2017   Hammer toes of both feet 12/19/2016   Allergic rhinitis 12/09/2016   Pedal edema 09/17/2016   Chest pain 07/18/2016   Gross hematuria 04/26/2016   Prediabetes 03/21/2016   Dysuria 12/07/2015   Fever blister 12/07/2015   Choroidal nevus, 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   Malignant neoplasm of left female breast (HCC) 10/10/2015   Anxiety and depression 06/30/2015   Encounter for well adult exam with abnormal findings 06/30/2015   Neck mass 03/24/2015   Hypothyroidism 03/24/2015   Gout 03/24/2015   Essential hypertension 03/24/2015   Asthma 03/24/2015   Peripheral neuropathy 03/24/2015   Osteoporosis 03/24/2015   Morbid obesity (HCC) 03/24/2015   Long term current use of anticoagulant therapy 03/16/2015    Lymphadenopathy, cervical 03/16/2015   Diarrhea 03/16/2015   Hyperbilirubinemia 03/16/2015   Coronary arteriosclerosis 09/22/2014   Hyperlipidemia 07/23/2013   Hypokalemia 02/18/2013   Elevated bilirubin 02/18/2013   Morbid obesity with BMI of 45.0-49.9, adult (HCC) 10/29/2012   Rotator cuff tear arthropathy 10/29/2012   DJD (degenerative joint disease) of knee 10/29/2012   Plantar fasciitis    Arthritis    Hx of radiation therapy    Atrial fibrillation, chronic (HCC) 01/21/2011   Malignant neoplasm of upper-outer quadrant of left breast in female, estrogen receptor positive (HCC) 12/26/2010   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 female who presents for initial evaluation of a head injury and multiple contusions. MOI: She fell/rolled out of bed due to her nightgown being tangled in the bedding. She was transported via EMS to the [facility] ED and dx w/ a levator scapula hematoma. Today, pt c/o cont'd confusion, soreness in her neck, shoulders, and thoracic spine. Her daughter notes a "bump" on the back of pt's head.   PAIN:  Are you having pain? Yes: NPRS scale: 4/10 Pain location: "the whole neck"  Pain description: ache Aggravating factors: worse today after stiffness in the bed today; activity, overhead movements  Relieving factors: extra strength tylenol  PRECAUTIONS:  None  WEIGHT BEARING RESTRICTIONS: No  FALLS: Has patient fallen in last 6 months? Yes. Number of falls 1  LIVING ENVIRONMENT: Lives with: lives alone Lives in: House/apartment Stairs: Yes: Internal: 2-3 steps; can reach both and typically uses rollator , ramp for entering/exiting home Has following equipment at home: Dan Humphreys - 4 wheeled  PLOF: Independent with household mobility with device and Independent with community mobility with device  PATIENT GOALS: reduce pain  OBJECTIVE:   TODAY'S TREATMENT: 12/26/22 Activity Comments  Discussion of symptoms Notes HA eminate from posterior head to top of head and neck stiffness and review of symptoms prior to VOMS  VOMS See below  Manual therapy Soft tisue mobilization bilateral cervical column to address trigger point along trapezial ridge and levator scap. -joint mobilization to right cervical column grade 2-4 to reduce pain, guarding, and increase ROM to improve right rotation  VOMS HA 0-10 Dizziness 0-10 Nausea 0-10 Fogginess 0-10 Total Comments  BASELINE < 1 0 0 4 5   Smooth pursuit 0 0 0 4 4   Saccades-horiz 0 0 0 4 4   Saccades-vert 0 0 0 4 4   Convergence 0 0 0 4 4 Measure 1:__17___ Measure 2:_17____ Measure 3:__17___ (Measured in cm)  VOR-horiz 0 3 0 6 9   VOR-vert 0 0 0 4 4   Visual motion sensitivity 4 0 0 4 8      TODAY'S TREATMENT: 12/24/22 Activity Comments  Seated with MHP x 5 min   Cervical ext SNAG 10x Using MHP+pillowcase  Seated upper trap/levator stretch 2x60 sec   Able to raise LUE overhead without issue.   Seated ER+retraction RUE unable to ext rotate from previous issue?  Seated unilat row 1x10 Red band  Scap adduction 2x10 red  Manual therapy -cervical spine for decreased pain, spasm/guarding, and decreased ROM.  Post-tx reports improved pain, decreased guarding, and demo greater rotation ROM       HOME EXERCISE PROGRAM Last updated: 12/12/22 Access Code: [email or web address] URL:  [email or web address] Date: 12/12/2022 Prepared by: [facility] - Outpatient  Rehab - [facility]  Exercises - Seated Cervical Sidebending Stretch  - 1 x daily - 7 x weekly - 3 sets - 30-60 sec hold - Seated Upper Trap Stretch  - 1 x daily - 7 x weekly - 3 sets - 30-60 sec hold - Cervical Retraction with Overpressure  - 1 x daily - 5 x weekly - 2 sets - 10 reps - 3 sec hold - Seated Assisted Cervical Rotation with Towel  - 1 x daily - 5 x weekly - 2 sets - 10 reps - Mid-Lower Cervical Extension SNAG with Strap  - 1 x daily - 5 x weekly - 2 sets - 10 reps - Romberg Stance with Eyes Closed  - 1 x daily - 5 x weekly - 2 sets - 30 sec hold - Romberg Stance with Head Nods  - 1 x daily - 5 x weekly - 2 sets - 30 sec hold - Supine Cervical Retraction with Towel  - 1 x daily - 7 x weekly - 2 sets - 5 reps - 3 sec hold - Standing Toe Taps  - 1 x daily - 5 x weekly - 2 sets - 10 reps - Corner Balance Feet Together With Eyes Open  - 1 x daily - 5 x weekly - 2 sets - 30 sec hold - Corner Balance Feet Together With Eyes Closed  - 1 x daily - 5 x weekly - 2 sets - 30 sec hold - Shoulder External Rotation and Scapular Retraction with Resistance  - 1 x daily - 7 x weekly - 1-3 sets - 10 reps - Seated Shoulder Horizontal Abduction with Resistance  - 1 x daily - 7 x weekly - 3 sets - 10 reps  PATIENT EDUCATION: Education details: HEP update with edu for safety; edu on DN Person educated: Patient Education method: Explanation, Demonstration, Tactile cues, Verbal cues, and Handouts Education comprehension: verbalized understanding and returned demonstration     Below measures were taken at time of initial evaluation unless otherwise specified:   DIAGNOSTIC FINDINGS:  IMPRESSION: No acute bony abnormality. Degenerative disc and facet disease throughout the cervical spine.   Enlargement of the left levator scapulae muscle with surrounding stranding most compatible with intramuscular  hematoma.  COGNITION: Overall cognitive status: Within functional limits for tasks assessed  SENSATION: WNL  EDEMA:    MUSCLE TONE:  WNL   POSTURE:  No Significant postural limitations  Cervical ROM:    Active A/PROM (deg) eval  Flexion 50  Extension 40  Right lateral flexion 15  Left lateral flexion 15  Right rotation 15  Left rotation 20  (Blank rows = not tested)  UE ROM: WFL--able to elevate arms overhead full ROM but with discomfort in LUE  STRENGTH: 5/5 gross BUE  PALPATION:  multiple areas of tenderness/trigger points throughout left shoulder girdle and neck--more soft tissue than joint discomfort of the area  BED MOBILITY:  independent  TRANSFERS: Assistive device utilized: Environmental consultant - 4 wheeled  Sit to stand: Modified independence Stand to sit: Modified independence Chair to chair: Modified independence Floor:  NT  RAMP: Modified independence  CURB: Modified independence  GAIT: Gait pattern: WFL Distance walked:  Assistive device utilized: Environmental consultant - 4 wheeled Level of assistance: Modified independence Comments:   FUNCTIONAL TESTS:  Timed up and go (TUG): 23 sec w/ rollator Dynamic Gait Index: 13/24  M-CTSIB  Condition 1: Firm Surface, EO 30 Sec, Normal and Mild Sway  Condition 2: Firm Surface, EC 10 Sec, Moderate and Severe Sway  Condition 3: Foam Surface, EO 30 Sec, Normal Sway  Condition 4: Foam Surface, EC 4 Sec, Severe Sway        GOALS: Goals reviewed with patient? Yes  SHORT TERM GOALS: Target date: 11/15/2022    Patient will be independent in HEP to improve functional outcomes Baseline: Goal status: IN PROGRESS    LONG TERM GOALS: Target date: 11/29/2022    Exhibit improved C-spine ROM to 25 degrees lateral flexion and 30+ degrees rotation to improve comfort and safety when driving Baseline:  Flexion: 50 deg Extension: 40 Right lateral flexion: 30  Left lateral: 15-20 Right rotation: 15 deg Left rotation: 25  deg Goal status: IN PROGRESS  2.  Report left shoulder pain not exceeding 3/10 with overhead movements to improve comfort with household activity and self-care tasks Baseline: 2-3/10 Goal status: MET  3.  Demo reduced risk for falls per time 15 sec TUG test Baseline: 23 sec w/ rollator; (12/03/22) 21 sec w/ rollator Goal status: IN PROGRESS  4.  Improved postural stability per mild sway conditions 2 and 4 M-CTSIB x 15 sec to improve safety with ADL Baseline: severe Goal status: IN PROGRESS    ASSESSMENT:  CLINICAL IMPRESSION: Pt reports fluctuating neck pain over past few days. Able to elevate LUE overhead without pain and demo full ROM.  Demonstrates limited right rotation vs left rotation c-spine with firm end-feel to overpressure right rotation.  VOMS reveals deficits with vestibular components vs oculomotor.  Patient neck pain is thankfully beginning to abate and demonstrate improved range which will hopefully help facilitate progression of POC to now address more of her balance/vestibular deficits that are present.   OBJECTIVE IMPAIRMENTS: decreased activity tolerance, decreased balance, decreased ROM, increased muscle spasms, impaired flexibility, and pain.   ACTIVITY LIMITATIONS: carrying, bending, sleeping, dressing, and reach over head  PARTICIPATION LIMITATIONS: meal prep, cleaning, laundry, driving, shopping, and community activity  PERSONAL FACTORS: Time since onset of injury/illness/exacerbation and 1-2 comorbidities: knee OA  are also affecting patient's functional outcome.   REHAB POTENTIAL: Excellent  CLINICAL DECISION MAKING: Stable/uncomplicated  EVALUATION COMPLEXITY: Low   PLAN:  PT FREQUENCY: 1-2x/week  PT DURATION: 4 weeks  PLANNED INTERVENTIONS: Therapeutic exercises, Therapeutic activity, Neuromuscular re-education, Balance training, Gait training, Patient/Family education, Self Care, Joint mobilization, Stair  training, Vestibular training, Canalith  repositioning, DME instructions, Aquatic Therapy, Dry Needling, Electrical stimulation, Spinal mobilization, Cryotherapy, Moist heat, Traction, Ultrasound, Ionotophoresis 4mg /ml Dexamethasone, and Manual therapy  PLAN FOR NEXT SESSION: initiate more vestibular activities. Cervical traction    2:54 PM, 12/26/22 M. Shary Decamp, PT, DPT Physical Therapist- [place] Office Number: [phone]

## 2022-12-30 ENCOUNTER — Telehealth: Payer: Self-pay | Admitting: Gastroenterology

## 2022-12-30 NOTE — Telephone Encounter (Signed)
Inbound call from patient requesting to speak with Viviann Spare regarding medication refill. Patient does not remember name of medication but described as a yellow pill. Patient requesting a call back to discuss further. Please advise, thank you.Marland Kitchen

## 2022-12-31 ENCOUNTER — Encounter: Payer: Self-pay | Admitting: Family Medicine

## 2022-12-31 ENCOUNTER — Ambulatory Visit: Payer: Medicare Other | Admitting: Family Medicine

## 2022-12-31 ENCOUNTER — Ambulatory Visit: Payer: Medicare Other

## 2022-12-31 VITALS — BP 130/80 | HR 73 | Ht 66.0 in | Wt 230.0 lb

## 2022-12-31 DIAGNOSIS — R262 Difficulty in walking, not elsewhere classified: Secondary | ICD-10-CM

## 2022-12-31 DIAGNOSIS — M542 Cervicalgia: Secondary | ICD-10-CM

## 2022-12-31 DIAGNOSIS — R2681 Unsteadiness on feet: Secondary | ICD-10-CM | POA: Diagnosis not present

## 2022-12-31 DIAGNOSIS — R42 Dizziness and giddiness: Secondary | ICD-10-CM | POA: Diagnosis not present

## 2022-12-31 DIAGNOSIS — H5111 Convergence insufficiency: Secondary | ICD-10-CM | POA: Diagnosis not present

## 2022-12-31 DIAGNOSIS — M6281 Muscle weakness (generalized): Secondary | ICD-10-CM | POA: Diagnosis not present

## 2022-12-31 DIAGNOSIS — S060X0D Concussion without loss of consciousness, subsequent encounter: Secondary | ICD-10-CM

## 2022-12-31 NOTE — Telephone Encounter (Signed)
Spoke with Pt daughter [name]. [name] stated that Cindi has been losing weight after a recent fall and was requesting to be prescribed the Remeron as she was before that help assisted her with her appetite. Please review and advise.

## 2022-12-31 NOTE — Patient Instructions (Signed)
Thank you for coming in today.   I've referred you to Physical Therapy.  Let us know if you don't hear from them in one week.   You should hear from MRI scheduling within 1 week. If you do not hear please let me know.    Recheck following the MRI.   Anticipate referral to Dr Lorrine Kin.

## 2022-12-31 NOTE — Progress Notes (Signed)
I, Stevenson Clinch, CMA acting as a scribe for [name], MD.  [name] is a 87 y.o. female who presents to [facility] at [facility] today for cont'd post-concussion symptoms. On June 11th, she fell/rolled out of bed due to her nightgown being tangled in the bedding. She was transported via EMS to the [facility] ED. Pt was last seen by Dr. [name] on 12/09/22 and was advised to cont PT, completing 13 visits.  Today, pt c/o cont'd speech and balance issues. Also c/o brain fog and neck pain. Has trouble concentrating and focusing. Would like to know if trigger point injection would be beneficial for sx. Has done well with dry-needling in the past. Notes decreased ROM. Notes variation in HR.    Pertinent review of systems: No fevers or chills  Relevant historical information: Heart failure   Exam:  BP 130/80   Pulse 73   Ht 5\' 6"  (1.676 m)   Wt 230 lb (104.3 kg)   SpO2 98%   BMI 37.12 kg/m  General: Well Developed, well nourished, and in no acute distress.   MSK: C-spine nontender midline.  Tender palpation paraspinal musculature bilaterally.  Decreased cervical motion.  Upper extremity strength is intact. Neuro: positive VOMS testing.     Lab and Radiology Results   EXAM: CT CERVICAL SPINE WITHOUT CONTRAST   TECHNIQUE: Multidetector CT imaging of the cervical spine was performed without intravenous contrast. Multiplanar CT image reconstructions were also generated.   RADIATION DOSE REDUCTION: This exam was performed according to the departmental dose-optimization program which includes automated exposure control, adjustment of the mA and/or kV according to patient size and/or use of iterative reconstruction technique.   COMPARISON:  Plain films 06/10/2017   FINDINGS: Alignment: No subluxation   Skull base and vertebrae: No acute fracture. No primary bone lesion or focal pathologic process.   Soft tissues and spinal canal: No prevertebral fluid  or swelling. No visible canal hematoma.   Disc levels: Fusion across the C6-7 disc space could be degenerative or congenital. Diffuse degenerative disc and facet disease. No visible disc herniation.   Upper chest: No acute findings   Other: There is enlargement of the left neck muscles with surrounding stranding concerning for intramuscular hematoma. This appears to be involving the levator scapulae muscle.   IMPRESSION: No acute bony abnormality. Degenerative disc and facet disease throughout the cervical spine.   Enlargement of the left levator scapulae muscle with surrounding stranding most compatible with intramuscular hematoma.     Electronically Signed   By: Charlett Nose M.D.   On: 10/15/2022 23:23  EXAM: CT HEAD WITHOUT CONTRAST   TECHNIQUE: Contiguous axial images were obtained from the base of the skull through the vertex without intravenous contrast.   RADIATION DOSE REDUCTION: This exam was performed according to the departmental dose-optimization program which includes automated exposure control, adjustment of the mA and/or kV according to patient size and/or use of iterative reconstruction technique.   COMPARISON:  08/12/2020   FINDINGS: Brain: There is atrophy and chronic small vessel disease changes. No acute intracranial abnormality. Specifically, no hemorrhage, hydrocephalus, mass lesion, acute infarction, or significant intracranial injury.   Vascular: No hyperdense vessel or unexpected calcification.   Skull: No acute calvarial abnormality.   Sinuses/Orbits: No acute findings   Other: None   IMPRESSION: Atrophy, chronic microvascular disease.   No acute intracranial abnormality.     Electronically Signed   By: Charlett Nose M.D.   On: 10/15/2022  23:13   I, [name], personally (independently) visualized and performed the interpretation of the images attached in this note.    Assessment and Plan: 87 y.o. female with chronic neck  pain and continued vestibular concussion symptoms.  Patient is experiencing chronic neck pain and headache thought to be muscle spasm and dysfunction and likely occipital neuralgia.  She is done a good trial of physical therapy already including dry needling which has been helpful.  She is unable to provide a distinct area for potential trigger point injection today.  Plan to proceed with advanced imaging MRI cervical spine for potential injection planning.  Anticipate referral to Dr. Lorrine Kin.  She could potentially benefit from occipital nerve block as well as other procedures or on the cervical spine such as facet injections or epidural steroid injection.  Additionally she continues to experience dizziness thought to be related to her concussion and convergence insufficiency.  Continue vestibular physical therapy.  Consider referral to neurorehab.  Will obtain brain MRI as her symptoms are now ongoing for almost 3 months.  Recheck after MRI.   PDMP not reviewed this encounter. Orders Placed This Encounter  Procedures   MR CERVICAL SPINE WO CONTRAST    Standing Status:   Future    Standing Expiration Date:   12/31/2023    Order Specific Question:   What is the patient's sedation requirement?    Answer:   No Sedation    Order Specific Question:   Does the patient have a pacemaker or implanted devices?    Answer:   No    Order Specific Question:   Preferred imaging location?    Answer:   [facility] (table limit-550lbs)   MR BRAIN WO CONTRAST    Standing Status:   Future    Standing Expiration Date:   12/31/2023    Order Specific Question:   What is the patient's sedation requirement?    Answer:   No Sedation    Order Specific Question:   Does the patient have a pacemaker or implanted devices?    Answer:   No    Order Specific Question:   Preferred imaging location?    Answer:   [facility] (table limit-550lbs)   Ambulatory referral to Physical Therapy    Referral Priority:    Routine    Referral Type:   Physical Medicine    Referral Reason:   Specialty Services Required    Requested Specialty:   Physical Therapy    Number of Visits Requested:   1   No orders of the defined types were placed in this encounter.    Discussed warning signs or symptoms. Please see discharge instructions. Patient expresses understanding.   The above documentation has been reviewed and is accurate and complete [name], M.D. Total encounter time 30 minutes including face-to-face time with the patient and, reviewing past medical record, and charting on the date of service.

## 2022-12-31 NOTE — Therapy (Unsigned)
OUTPATIENT PHYSICAL THERAPY VESTIBULAR TREATMENT/Progress Note/Recertification     Patient Name: [name] MRN: [id number] DOB:09-24-1935, 87 y.o., female Today's Date: 12/31/2022   Progress Note Reporting Period 12/03/22 to 12/31/22  See note below for Objective Data and Assessment of Progress/Goals.     END OF SESSION:  PT End of Session - 12/31/22 1452     Visit Number 14    Number of Visits 26    Date for PT Re-Evaluation 02/11/23    Authorization Type [employer]    Progress Note Due on Visit 24    PT Start Time 1451    PT Stop Time 1530    PT Time Calculation (min) 39 min    Activity Tolerance Patient tolerated treatment well    Behavior During Therapy WFL for tasks assessed/performed                   Past Medical History:  Diagnosis Date   Allergy    Anxiety    Arthritis    no cartlidge in knees   Asthma    Bradycardia    a. atenolol stopped due to this, HR 40s.   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  CAD s/p RCA stent 2000 with residual mild-mod disease of left system 2001.   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   Osteoporosis 03/24/2015   Peripheral neuropathy 03/24/2015   Plantar fasciitis    Sleep apnea    Past Surgical History:  Procedure Laterality Date   BREAST LUMPECTOMY W/ NEEDLE LOCALIZATION  01/29/2011   Left with [facility] Dr Jamey Ripa   CHOLECYSTECTOMY  1955   CORONARY ANGIOPLASTY WITH STENT PLACEMENT  2000   DILATION AND CURETTAGE OF UTERUS  1976   and 1996   HAND SURGERY  1992   tendons between thumb and forefinger   SKIN BIOPSY     1994, 2006,  2008, 2010, 2011, 2011, 2012-  pre-cancerous   TONSILLECTOMY  1955   Patient Active Problem List   Diagnosis Date Noted   Acute renal failure superimposed on chronic kidney disease (HCC) 12/16/2022   Cloudy urine 11/25/2022   Insomnia 07/27/2022   Vitamin D deficiency 07/27/2022   Pulmonary hypertension (HCC) 11/07/2021   Bronchiectasis without complication (HCC) 11/07/2021   Hepatic cirrhosis (HCC) 11/06/2021   ILD (interstitial lung disease) (HCC) 10/03/2021   Abnormal LFTs 09/28/2021   Weight loss 08/19/2021   Chronic respiratory failure with hypoxia (HCC) 08/19/2021   Debility 06/29/2021   Multifocal pneumonia 06/15/2021   Elevated troponin 06/12/2021   Febrile illness 05/22/2021   Anxiety state 10/23/2020   Pure hypercholesterolemia 10/23/2020   Urinary tract infectious disease 10/23/2020   Atherosclerosis of coronary artery 10/23/2020   Sinus node dysfunction (HCC) 10/23/2020   Mixed hyperlipidemia 10/23/2020   OSA on CPAP 10/23/2020   Fall 09/05/2020   Nausea 04/23/2020   Constipation 04/23/2020   Posterior uveitis, 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   Dysphagia 01/21/2020   CHF (congestive heart failure) (HCC) 11/23/2019   Exposure to COVID-19 virus 12/04/2018   Osteoarthritis 09/19/2018  Abscess of left leg 09/26/2017   Left leg cellulitis 09/18/2017   Whiplash 06/10/2017   Cough 05/29/2017   Concussion 05/29/2017   Hammer toes of both feet 12/19/2016   Allergic rhinitis 12/09/2016   Pedal edema 09/17/2016   Chest pain 07/18/2016   Gross hematuria 04/26/2016   Prediabetes 03/21/2016   Dysuria 12/07/2015   Fever blister 12/07/2015   Choroidal nevus, 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   Malignant neoplasm of left female breast (HCC) 10/10/2015   Anxiety and depression 06/30/2015   Encounter for well adult exam with abnormal findings 06/30/2015   Neck mass 03/24/2015   Hypothyroidism 03/24/2015   Gout 03/24/2015   Essential hypertension 03/24/2015   Asthma 03/24/2015   Peripheral  neuropathy 03/24/2015   Osteoporosis 03/24/2015   Morbid obesity (HCC) 03/24/2015   Long term current use of anticoagulant therapy 03/16/2015   Lymphadenopathy, cervical 03/16/2015   Diarrhea 03/16/2015   Hyperbilirubinemia 03/16/2015   Coronary arteriosclerosis 09/22/2014   Hyperlipidemia 07/23/2013   Hypokalemia 02/18/2013   Elevated bilirubin 02/18/2013   Morbid obesity with BMI of 45.0-49.9, adult (HCC) 10/29/2012   Rotator cuff tear arthropathy 10/29/2012   DJD (degenerative joint disease) of knee 10/29/2012   Plantar fasciitis    Arthritis    Hx of radiation therapy    Atrial fibrillation, chronic (HCC) 01/21/2011   Malignant neoplasm of upper-outer quadrant of left breast in female, estrogen receptor positive (HCC) 12/26/2010   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 female who presents for initial evaluation of a head injury and multiple contusions. MOI: She fell/rolled out of bed due to her nightgown being tangled in the bedding. She was transported via EMS to [facility] ED and dx w/ a levator scapula hematoma. Today, pt c/o cont'd confusion, soreness in her neck, shoulders, and thoracic spine. Her daughter notes a "bump" on the back of pt's head.   PAIN:  Are you having pain? Yes: NPRS scale: 4/10 Pain location: "the whole neck"  Pain description:  ache Aggravating factors: worse today after stiffness in the bed today; activity, overhead movements  Relieving factors: extra strength tylenol  PRECAUTIONS: None  WEIGHT BEARING RESTRICTIONS: No  FALLS: Has patient fallen in last 6 months? Yes. Number of falls 1  LIVING ENVIRONMENT: Lives with: lives alone Lives in: House/apartment Stairs: Yes: Internal: 2-3 steps; can reach both and typically uses rollator , ramp for entering/exiting home Has following equipment at home: Dan Humphreys - 4 wheeled  PLOF: Independent with household mobility with device and Independent with community mobility with device  PATIENT GOALS: reduce pain  OBJECTIVE:   TODAY'S TREATMENT: 12/31/22 Activity Comments  C-spine ROM See goals for measurements  M-CTSIB Condition 1: normal Condition 2: mild Condition 3: moderate Condition 4: moderate-severe x 20 sec  TUG test 20 sec  Dynamic Gait  15/24  Gait speed 1.8 ft/sec   Pencil push-ups 1x10 17 cm distance  VOMS HA 0-10 Dizziness 0-10 Nausea 0-10 Fogginess 0-10 Total Comments  BASELINE < 1 0 0 4 5    Smooth pursuit 0 0 0 4 4    Saccades-horiz 0 0 0 4 4    Saccades-vert 0 0 0 4 4    Convergence 0 0 0 4 4 Measure 1:__17___ Measure 2:_17____ Measure 3:__17___ (Measured in cm)  VOR-horiz 0 3 0 6 9    VOR-vert 0 0 0 4 4    Visual motion sensitivity 4 0 0 4 8       HOME EXERCISE PROGRAM Last updated: 12/12/22 Access Code: [email or web address] URL: [email or web address] Date: 12/12/2022 Prepared by: [facility] - Outpatient  Rehab - [facility]  Exercises - Seated Cervical Sidebending Stretch  - 1 x daily - 7 x weekly - 3 sets - 30-60 sec hold - Seated Upper Trap Stretch  - 1 x daily - 7 x weekly - 3 sets - 30-60 sec hold - Cervical Retraction with Overpressure  - 1 x daily - 5 x weekly - 2 sets - 10 reps - 3 sec hold - Seated Assisted Cervical Rotation with Towel  - 1 x daily - 5 x weekly - 2 sets - 10 reps - Mid-Lower Cervical  Extension SNAG with Strap  - 1 x daily - 5 x weekly - 2 sets - 10 reps - Romberg Stance with Eyes Closed  - 1 x daily - 5 x weekly - 2 sets - 30 sec hold - Romberg Stance with Head Nods  - 1 x daily - 5 x weekly - 2 sets - 30 sec hold - Supine Cervical Retraction with Towel  - 1 x daily - 7 x weekly - 2 sets - 5 reps - 3 sec hold - Standing Toe Taps  - 1 x daily - 5 x weekly - 2 sets - 10 reps - Corner Balance Feet Together With Eyes Open  - 1 x daily - 5 x weekly - 2 sets - 30 sec hold - Corner Balance Feet Together With Eyes Closed  - 1 x daily - 5 x weekly - 2 sets - 30 sec hold - Shoulder External Rotation and Scapular Retraction with Resistance  - 1 x daily - 7 x weekly - 1-3 sets - 10 reps - Seated Shoulder Horizontal Abduction with Resistance  - 1 x daily - 7 x weekly - 3 sets - 10 reps - Pencil Pushups  - 1 x daily - 7 x weekly - 1-3 sets - 10 reps  PATIENT EDUCATION: Education details: HEP update with edu for safety; edu on DN Person educated: Patient Education method: Explanation, Demonstration, Tactile cues, Verbal cues, and Handouts Education comprehension: verbalized understanding and returned demonstration     Below measures were taken at time of initial evaluation unless otherwise specified:   DIAGNOSTIC FINDINGS:  IMPRESSION: No acute bony abnormality. Degenerative disc and facet disease throughout the cervical spine.   Enlargement of the left levator scapulae muscle with surrounding stranding most compatible with intramuscular hematoma.  COGNITION: Overall cognitive status: Within functional limits for tasks assessed   SENSATION: WNL  EDEMA:    MUSCLE TONE:  WNL   POSTURE:  No Significant postural limitations  Cervical ROM:    Active A/PROM (deg) eval  Flexion 50  Extension 40  Right lateral flexion 15  Left lateral flexion 15  Right rotation 15  Left rotation 20  (Blank rows = not tested)  UE ROM: WFL--able to elevate arms  overhead full ROM  but with discomfort in LUE  STRENGTH: 5/5 gross BUE  PALPATION:  multiple areas of tenderness/trigger points throughout left shoulder girdle and neck--more soft tissue than joint discomfort of the area  BED MOBILITY:  independent  TRANSFERS: Assistive device utilized: Environmental consultant - 4 wheeled  Sit to stand: Modified independence Stand to sit: Modified independence Chair to chair: Modified independence Floor:  NT  RAMP: Modified independence  CURB: Modified independence  GAIT: Gait pattern: WFL Distance walked:  Assistive device utilized: Environmental consultant - 4 wheeled Level of assistance: Modified independence Comments:   FUNCTIONAL TESTS:  Timed up and go (TUG): 23 sec w/ rollator Dynamic Gait Index: 13/24  M-CTSIB  Condition 1: Firm Surface, EO 30 Sec, Normal and Mild Sway  Condition 2: Firm Surface, EC 10 Sec, Moderate and Severe Sway  Condition 3: Foam Surface, EO 30 Sec, Normal Sway  Condition 4: Foam Surface, EC 4 Sec, Severe Sway        GOALS: Goals reviewed with patient? Yes  SHORT TERM GOALS: Target date: 11/15/2022    Patient will be independent in HEP to improve functional outcomes Baseline: Goal status: MET    LONG TERM GOALS: Target date: 11/29/2022    Exhibit improved C-spine ROM to 25 degrees lateral flexion and 30+ degrees rotation to improve comfort and safety when driving Baseline:  Flexion: 45 deg Extension: 30-40 Right lateral flexion: 20-25  Left lateral: 20 Right rotation: 20 deg Left rotation: 25 deg Goal status: IN PROGRESS  2.  Report left shoulder pain not exceeding 3/10 with overhead movements to improve comfort with household activity and self-care tasks Baseline: 2-3/10 Goal status: MET  3.  Demo reduced risk for falls per time 15 sec TUG test Baseline: 23 sec w/ rollator; (12/03/22) 21 sec w/ rollator Goal status: IN PROGRESS  4.  Improved postural stability per mild sway conditions 2 and 4 M-CTSIB x 15 sec to improve safety with  ADL Baseline: severe Goal status: IN PROGRESS    ASSESSMENT:  CLINICAL IMPRESSION: Pt reports fluctuating neck pain over past few days. Able to elevate LUE overhead without pain and demo full ROM.  Demonstrates limited right rotation vs left rotation c-spine with firm end-feel to overpressure right rotation.  VOMS reveals deficits with vestibular components vs oculomotor.  Patient neck pain is thankfully beginning to abate and demonstrate improved range which will hopefully help facilitate progression of POC to now address more of her balance/vestibular deficits that are present.   OBJECTIVE IMPAIRMENTS: decreased activity tolerance, decreased balance, decreased ROM, increased muscle spasms, impaired flexibility, and pain.   ACTIVITY LIMITATIONS: carrying, bending, sleeping, dressing, and reach over head  PARTICIPATION LIMITATIONS: meal prep, cleaning, laundry, driving, shopping, and community activity  PERSONAL FACTORS: Time since onset of injury/illness/exacerbation and 1-2 comorbidities: knee OA  are also affecting patient's functional outcome.   REHAB POTENTIAL: Excellent  CLINICAL DECISION MAKING: Stable/uncomplicated  EVALUATION COMPLEXITY: Low   PLAN:  PT FREQUENCY: 1-2x/week  PT DURATION: 4 weeks  PLANNED INTERVENTIONS: Therapeutic exercises, Therapeutic activity, Neuromuscular re-education, Balance training, Gait training, Patient/Family education, Self Care, Joint mobilization, Stair training, Vestibular training, Canalith repositioning, DME instructions, Aquatic Therapy, Dry Needling, Electrical stimulation, Spinal mobilization, Cryotherapy, Moist heat, Traction, Ultrasound, Ionotophoresis 4mg /ml Dexamethasone, and Manual therapy  PLAN FOR NEXT SESSION: initiate more vestibular activities. Cervical traction    3:46 PM, 12/31/22 M. Shary Decamp, PT, DPT Physical Therapist- [place] Office Number: [phone]

## 2022-12-31 NOTE — Telephone Encounter (Signed)
Left message for pt to call back  °

## 2022-12-31 NOTE — Telephone Encounter (Signed)
Okay by me But please get in touch with PCP Dr. Jonny Ruiz for correct dosing and monitoring RG

## 2023-01-01 NOTE — Telephone Encounter (Signed)
Pt daughter [name] made aware of Dr. Chales Abrahams recommendations: [name]  verbalized understanding with all questions answered.

## 2023-01-08 ENCOUNTER — Telehealth: Payer: Self-pay | Admitting: Interventional Cardiology

## 2023-01-08 NOTE — Telephone Encounter (Signed)
Patient is requesting call back to discuss a past procedure she had in the past with a stent. Please advise.

## 2023-01-08 NOTE — Telephone Encounter (Signed)
I spoke with patient and let her know stent was in her RCA

## 2023-01-08 NOTE — Therapy (Signed)
OUTPATIENT PHYSICAL THERAPY VESTIBULAR TREATMENT     Patient Name: [name] MRN: [id number] DOB:Jan 10, 1936, 87 y.o., female Today's Date: 01/09/2023    END OF SESSION:  PT End of Session - 01/09/23 1637     Visit Number 15    Number of Visits 26    Date for PT Re-Evaluation 02/11/23    Authorization Type [employer]    Progress Note Due on Visit 24    PT Start Time 1540   pt late   PT Stop Time 1618    PT Time Calculation (min) 38 min    Equipment Utilized During Treatment Gait belt    Activity Tolerance Patient tolerated treatment well    Behavior During Therapy WFL for tasks assessed/performed                    Past Medical History:  Diagnosis Date   Allergy    Anxiety    Arthritis    no cartlidge in knees   Asthma    Bradycardia    a. atenolol stopped due to this, HR 40s.   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  CAD s/p RCA stent 2000 with residual mild-mod disease of left system 2001.   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   Osteoporosis 03/24/2015   Peripheral neuropathy 03/24/2015   Plantar fasciitis    Sleep apnea    Past Surgical History:  Procedure Laterality Date   BREAST LUMPECTOMY W/ NEEDLE LOCALIZATION  01/29/2011   Left with [facility] Dr Jamey Ripa   CHOLECYSTECTOMY  1955   CORONARY ANGIOPLASTY WITH STENT PLACEMENT  2000   DILATION AND CURETTAGE OF UTERUS  1976   and 1996   HAND SURGERY  1992   tendons between thumb and forefinger   SKIN BIOPSY     1994, 2006, 2008, 2010, 2011, 2011, 2012-  pre-cancerous   TONSILLECTOMY  1955   Patient Active Problem List    Diagnosis Date Noted   Acute renal failure superimposed on chronic kidney disease (HCC) 12/16/2022   Cloudy urine 11/25/2022   Insomnia 07/27/2022   Vitamin D deficiency 07/27/2022   Pulmonary hypertension (HCC) 11/07/2021   Bronchiectasis without complication (HCC) 11/07/2021   Hepatic cirrhosis (HCC) 11/06/2021   ILD (interstitial lung disease) (HCC) 10/03/2021   Abnormal LFTs 09/28/2021   Weight loss 08/19/2021   Chronic respiratory failure with hypoxia (HCC) 08/19/2021   Debility 06/29/2021   Multifocal pneumonia 06/15/2021   Elevated troponin 06/12/2021   Febrile illness 05/22/2021   Anxiety state 10/23/2020   Pure hypercholesterolemia 10/23/2020   Urinary tract infectious disease 10/23/2020   Atherosclerosis of coronary artery 10/23/2020   Sinus node dysfunction (HCC) 10/23/2020   Mixed hyperlipidemia 10/23/2020   OSA on CPAP 10/23/2020   Fall 09/05/2020   Nausea 04/23/2020   Constipation 04/23/2020   Posterior uveitis, 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   Dysphagia 01/21/2020   CHF (congestive heart failure) (HCC) 11/23/2019   Exposure to COVID-19 virus 12/04/2018   Osteoarthritis 09/19/2018   Abscess of left leg 09/26/2017   Left leg  cellulitis 09/18/2017   Whiplash 06/10/2017   Cough 05/29/2017   Concussion 05/29/2017   Hammer toes of both feet 12/19/2016   Allergic rhinitis 12/09/2016   Pedal edema 09/17/2016   Chest pain 07/18/2016   Gross hematuria 04/26/2016   Prediabetes 03/21/2016   Dysuria 12/07/2015   Fever blister 12/07/2015   Choroidal nevus, 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   Malignant neoplasm of left female breast (HCC) 10/10/2015   Anxiety and depression 06/30/2015   Encounter for well adult exam with abnormal findings 06/30/2015   Neck mass 03/24/2015   Hypothyroidism 03/24/2015   Gout 03/24/2015   Essential hypertension 03/24/2015   Asthma 03/24/2015   Peripheral neuropathy 03/24/2015   Osteoporosis 03/24/2015   Morbid obesity (HCC) 03/24/2015   Long term  current use of anticoagulant therapy 03/16/2015   Lymphadenopathy, cervical 03/16/2015   Diarrhea 03/16/2015   Hyperbilirubinemia 03/16/2015   Coronary arteriosclerosis 09/22/2014   Hyperlipidemia 07/23/2013   Hypokalemia 02/18/2013   Elevated bilirubin 02/18/2013   Morbid obesity with BMI of 45.0-49.9, adult (HCC) 10/29/2012   Rotator cuff tear arthropathy 10/29/2012   DJD (degenerative joint disease) of knee 10/29/2012   Plantar fasciitis    Arthritis    Hx of radiation therapy    Atrial fibrillation, chronic (HCC) 01/21/2011   Malignant neoplasm of upper-outer quadrant of left breast in female, estrogen receptor positive (HCC) 12/26/2010   Breast cancer (ILC) Receptor + her2 _ 12/26/2010    PCP: [name], MD REFERRING PROVIDER: Rodolph Bong, MD  REFERRING DIAG:  S06.0X0D (ICD-10-CM) - Concussion without loss of consciousness, subsequent encounter    THERAPY DIAG:  Muscle weakness (generalized)  Unsteadiness on feet  Dizziness and giddiness  Neck pain  Difficulty in walking, not elsewhere classified  ONSET DATE: 10/15/22  Rationale for Evaluation and Treatment: Rehabilitation  SUBJECTIVE:   SUBJECTIVE STATEMENT: "Got a lot better, then got a lot worse." Reports difficulty getting the right word out- has an MRI scheduled. Pt requesting massage today.   Pt accompanied by: self  PERTINENT HISTORY: [name],  is a 87 y.o. female who presents for initial evaluation of a head injury and multiple contusions. MOI: She fell/rolled out of bed due to her nightgown being tangled in the bedding. She was transported via EMS to the [facility] ED and dx w/ a levator scapula hematoma. Today, pt c/o cont'd confusion, soreness in her neck, shoulders, and thoracic spine. Her daughter notes a "bump" on the back of pt's head.   PAIN:  Are you having pain? Yes: NPRS scale: 4/10 Pain location: "the whole neck"  Pain description: ache Aggravating factors: worse today after  stiffness in the bed today; activity, overhead movements  Relieving factors: extra strength tylenol  PRECAUTIONS: None  WEIGHT BEARING RESTRICTIONS: No  FALLS: Has patient fallen in last 6 months? Yes. Number of falls 1  LIVING ENVIRONMENT: Lives with: lives alone Lives in: House/apartment Stairs: Yes: Internal: 2-3 steps; can reach both and typically uses rollator , ramp for entering/exiting home Has following equipment at home: Dan Humphreys - 4 wheeled  PLOF: Independent with household mobility with device and Independent with community mobility with device  PATIENT GOALS: reduce pain  OBJECTIVE:     TODAY'S TREATMENT: 01/09/23 Activity Comments  sitting VOR horizontal 30"  Difficulty coordinating continuous motion; No dizziness  sitting VOR vertical 30" No dizziness  sitting VOR cancellation 30" No dizziness  brandt daroff R/L No dizziness; cueing for proper alignment   Sitting anterior canal habituation R/L  EO and EC Transient c/o feeling like she is falling on L- could not replicate this   romberg EO/EC With 4WW in front; occasional UE support d/t imbalance   romberg + head turns/nods With 4WW in front; occasional UE support d/t imbalance   STM and manual TPR to cervical musculature  Large palpabe trigger pts and taut muscle bands in scalenes, UT, suboccipitals, LS; pt reported "I feel so much better"        HOME EXERCISE PROGRAM Last updated: 12/12/22 Access Code: [email or web address] URL: [email or web address] Date: 12/12/2022 Prepared by: [facility] - [facility]  Exercises - Seated Cervical Sidebending Stretch  - 1 x daily - 7 x weekly - 3 sets - 30-60 sec hold - Seated Upper Trap Stretch  - 1 x daily - 7 x weekly - 3 sets - 30-60 sec hold - Cervical Retraction with Overpressure  - 1 x daily - 5 x weekly - 2 sets - 10 reps - 3 sec hold - Seated Assisted Cervical Rotation with Towel  - 1 x daily - 5 x weekly - 2 sets - 10 reps - Mid-Lower  Cervical Extension SNAG with Strap  - 1 x daily - 5 x weekly - 2 sets - 10 reps - Romberg Stance with Eyes Closed  - 1 x daily - 5 x weekly - 2 sets - 30 sec hold - Romberg Stance with Head Nods  - 1 x daily - 5 x weekly - 2 sets - 30 sec hold - Supine Cervical Retraction with Towel  - 1 x daily - 7 x weekly - 2 sets - 5 reps - 3 sec hold - Standing Toe Taps  - 1 x daily - 5 x weekly - 2 sets - 10 reps - Corner Balance Feet Together With Eyes Open  - 1 x daily - 5 x weekly - 2 sets - 30 sec hold - Corner Balance Feet Together With Eyes Closed  - 1 x daily - 5 x weekly - 2 sets - 30 sec hold - Shoulder External Rotation and Scapular Retraction with Resistance  - 1 x daily - 7 x weekly - 1-3 sets - 10 reps - Seated Shoulder Horizontal Abduction with Resistance  - 1 x daily - 7 x weekly - 3 sets - 10 reps - Pencil Pushups  - 1 x daily - 7 x weekly - 1-3 sets - 10 reps     Below measures were taken at time of initial evaluation unless otherwise specified:   DIAGNOSTIC FINDINGS:  IMPRESSION: No acute bony abnormality. Degenerative disc and facet disease throughout the cervical spine.   Enlargement of the left levator scapulae muscle with surrounding stranding most compatible with intramuscular hematoma.  COGNITION: Overall cognitive status: Within functional limits for tasks assessed   SENSATION: WNL  EDEMA:    MUSCLE TONE:  WNL   POSTURE:  No Significant postural limitations  Cervical ROM:    Active A/PROM (deg) eval  Flexion 50  Extension 40  Right lateral flexion 15  Left lateral flexion 15  Right rotation 15  Left rotation 20  (Blank rows = not tested)  UE ROM: WFL--able to elevate arms overhead full ROM but with discomfort in LUE  STRENGTH: 5/5 gross BUE  PALPATION:  multiple areas of tenderness/trigger points throughout left shoulder girdle and neck--more soft tissue than joint discomfort of the area  BED MOBILITY:  independent  TRANSFERS: Assistive  device utilized: Environmental consultant - 4 wheeled  Sit to stand: Modified independence Stand to sit: Modified independence Chair to chair: Modified independence Floor:  NT  RAMP: Modified independence  CURB: Modified independence  GAIT: Gait pattern: WFL Distance walked:  Assistive device utilized: Environmental consultant - 4 wheeled Level of assistance: Modified independence Comments:   FUNCTIONAL TESTS:  Timed up and go (TUG): 23 sec w/ rollator Dynamic Gait Index: 13/24  M-CTSIB  Condition 1: Firm Surface, EO 30 Sec, Normal and Mild Sway  Condition 2: Firm Surface, EC 10 Sec, Moderate and Severe Sway  Condition 3: Foam Surface, EO 30 Sec, Normal Sway  Condition 4: Foam Surface, EC 4 Sec, Severe Sway        GOALS: Goals reviewed with patient? Yes  SHORT TERM GOALS: Target date: 11/15/2022    Patient will be independent in HEP to improve functional outcomes Baseline: Goal status: MET    LONG TERM GOALS: Target date: 02/12/2023      Exhibit improved C-spine ROM to 25 degrees lateral flexion and 30+ degrees rotation to improve comfort and safety when driving Baseline:  Flexion: 45 deg Extension: 30-40 Right lateral flexion: 20-25  Left lateral: 20 Right rotation: 20 deg Left rotation: 25 deg Goal status: IN PROGRESS  2.  Report left shoulder pain not exceeding 3/10 with overhead movements to improve comfort with household activity and self-care tasks Baseline: 2-3/10 Goal status: MET  3.  Demo reduced risk for falls per time 15 sec TUG test Baseline: 23 sec w/ rollator; (12/03/22) 21 sec w/ rollator Goal status: IN PROGRESS  4.  Improved postural stability per mild sway conditions 2 and 4 M-CTSIB x 15 sec to improve safety with ADL Baseline: modertate x 20 sec Goal status: IN PROGRESS  5.  Demo reduced risk for falls per score 19/24 Dynamic Gait Index  Baseline: 15/24  Goal status: INITIAL  ASSESSMENT:  CLINICAL IMPRESSION: Patient arrived to session with report of aphasia  for which she has a brain MRI scheduled. Session today focused on gaze stabilization tasks and habituation. Patient denied dizziness with all activities. Occasional UE support required with all balance activities d/t imbalance. Patient still with significant tightness with MT today but with good improvement in soft tissue restriction at end of session. Pt reported "I feel so much better."   OBJECTIVE IMPAIRMENTS: decreased activity tolerance, decreased balance, decreased ROM, increased muscle spasms, impaired flexibility, and pain.   ACTIVITY LIMITATIONS: carrying, bending, sleeping, dressing, and reach over head  PARTICIPATION LIMITATIONS: meal prep, cleaning, laundry, driving, shopping, and community activity  PERSONAL FACTORS: Time since onset of injury/illness/exacerbation and 1-2 comorbidities: knee OA  are also affecting patient's functional outcome.   REHAB POTENTIAL: Excellent  CLINICAL DECISION MAKING: Stable/uncomplicated  EVALUATION COMPLEXITY: Low   PLAN:  PT FREQUENCY: 1-2x/week, plan is for 1x/wk if neck pain does not continue to limit  PT DURATION: 6 weeks  PLANNED INTERVENTIONS: Therapeutic exercises, Therapeutic activity, Neuromuscular re-education, Balance training, Gait training, Patient/Family education, Self Care, Joint mobilization, Stair training, Vestibular training, Canalith repositioning, DME instructions, Aquatic Therapy, Dry Needling, Electrical stimulation, Spinal mobilization, Cryotherapy, Moist heat, Traction, Ultrasound, Ionotophoresis 4mg /ml Dexamethasone, and Manual therapy  PLAN FOR NEXT SESSION: balance activities, Cervical traction    Anette Guarneri, PT, DPT 01/09/23 4:40 PM  [facility] [address] [place], [place] [address] Phone # [phone] Fax # [phone]

## 2023-01-09 ENCOUNTER — Ambulatory Visit: Payer: Medicare Other | Attending: Family Medicine | Admitting: Physical Therapy

## 2023-01-09 ENCOUNTER — Encounter: Payer: Self-pay | Admitting: Physical Therapy

## 2023-01-09 DIAGNOSIS — R42 Dizziness and giddiness: Secondary | ICD-10-CM | POA: Diagnosis not present

## 2023-01-09 DIAGNOSIS — R2681 Unsteadiness on feet: Secondary | ICD-10-CM | POA: Diagnosis not present

## 2023-01-09 DIAGNOSIS — M6281 Muscle weakness (generalized): Secondary | ICD-10-CM | POA: Diagnosis not present

## 2023-01-09 DIAGNOSIS — M542 Cervicalgia: Secondary | ICD-10-CM | POA: Insufficient documentation

## 2023-01-09 DIAGNOSIS — R262 Difficulty in walking, not elsewhere classified: Secondary | ICD-10-CM | POA: Insufficient documentation

## 2023-01-10 ENCOUNTER — Other Ambulatory Visit: Payer: Self-pay

## 2023-01-10 MED ORDER — LEVOTHYROXINE SODIUM 75 MCG PO TABS: 75.0000 ug | ORAL_TABLET | Freq: Every day | ORAL | 1 refills | Status: DC

## 2023-01-13 ENCOUNTER — Other Ambulatory Visit: Payer: Self-pay | Admitting: Internal Medicine

## 2023-01-13 ENCOUNTER — Other Ambulatory Visit: Payer: Self-pay | Admitting: Interventional Cardiology

## 2023-01-15 ENCOUNTER — Ambulatory Visit: Payer: Medicare Other

## 2023-01-15 DIAGNOSIS — M6281 Muscle weakness (generalized): Secondary | ICD-10-CM | POA: Diagnosis not present

## 2023-01-15 DIAGNOSIS — M542 Cervicalgia: Secondary | ICD-10-CM

## 2023-01-15 DIAGNOSIS — R262 Difficulty in walking, not elsewhere classified: Secondary | ICD-10-CM

## 2023-01-15 DIAGNOSIS — R42 Dizziness and giddiness: Secondary | ICD-10-CM

## 2023-01-15 DIAGNOSIS — R2681 Unsteadiness on feet: Secondary | ICD-10-CM | POA: Diagnosis not present

## 2023-01-15 NOTE — Therapy (Signed)
OUTPATIENT PHYSICAL THERAPY VESTIBULAR TREATMENT     Patient Name: [name] MRN: [id number] DOB:13-Aug-1935, 87 y.o., female Today's Date: 01/15/2023    END OF SESSION:  PT End of Session - 01/15/23 1451     Visit Number 16    Number of Visits 26    Date for PT Re-Evaluation 02/11/23    Authorization Type [employer]    Progress Note Due on Visit 24    PT Start Time 1445    PT Stop Time 1530    PT Time Calculation (min) 45 min    Equipment Utilized During Treatment Gait belt    Activity Tolerance Patient tolerated treatment well    Behavior During Therapy WFL for tasks assessed/performed                    Past Medical History:  Diagnosis Date   Allergy    Anxiety    Arthritis    no cartlidge in knees   Asthma    Bradycardia    a. atenolol stopped due to this, HR 40s.   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  CAD s/p RCA stent 2000 with residual mild-mod disease of left system 2001.   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   Osteoporosis 03/24/2015   Peripheral neuropathy 03/24/2015   Plantar fasciitis    Sleep apnea    Past Surgical History:  Procedure Laterality Date   BREAST LUMPECTOMY W/ NEEDLE LOCALIZATION  01/29/2011   Left with [facility] Dr Jamey Ripa   CHOLECYSTECTOMY  1955   CORONARY ANGIOPLASTY WITH STENT PLACEMENT  2000   DILATION AND CURETTAGE OF UTERUS  1976   and 1996   HAND SURGERY  1992   tendons between thumb and forefinger   SKIN BIOPSY     1994, 2006, 2008, 2010, 2011, 2011, 2012-  pre-cancerous   TONSILLECTOMY  1955   Patient Active Problem List    Diagnosis Date Noted   Acute renal failure superimposed on chronic kidney disease (HCC) 12/16/2022   Cloudy urine 11/25/2022   Insomnia 07/27/2022   Vitamin D deficiency 07/27/2022   Pulmonary hypertension (HCC) 11/07/2021   Bronchiectasis without complication (HCC) 11/07/2021   Hepatic cirrhosis (HCC) 11/06/2021   ILD (interstitial lung disease) (HCC) 10/03/2021   Abnormal LFTs 09/28/2021   Weight loss 08/19/2021   Chronic respiratory failure with hypoxia (HCC) 08/19/2021   Debility 06/29/2021   Multifocal pneumonia 06/15/2021   Elevated troponin 06/12/2021   Febrile illness 05/22/2021   Anxiety state 10/23/2020   Pure hypercholesterolemia 10/23/2020   Urinary tract infectious disease 10/23/2020   Atherosclerosis of coronary artery 10/23/2020   Sinus node dysfunction (HCC) 10/23/2020   Mixed hyperlipidemia 10/23/2020   OSA on CPAP 10/23/2020   Fall 09/05/2020   Nausea 04/23/2020   Constipation 04/23/2020   Posterior uveitis, 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   Dysphagia 01/21/2020   CHF (congestive heart failure) (HCC) 11/23/2019   Exposure to COVID-19 virus 12/04/2018   Osteoarthritis 09/19/2018   Abscess of left leg 09/26/2017   Left leg cellulitis 09/18/2017  Whiplash 06/10/2017   Cough 05/29/2017   Concussion 05/29/2017   Hammer toes of both feet 12/19/2016   Allergic rhinitis 12/09/2016   Pedal edema 09/17/2016   Chest pain 07/18/2016   Gross hematuria 04/26/2016   Prediabetes 03/21/2016   Dysuria 12/07/2015   Fever blister 12/07/2015   Choroidal nevus, 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   Malignant neoplasm of left female breast (HCC) 10/10/2015   Anxiety and depression 06/30/2015   Encounter for well adult exam with abnormal findings 06/30/2015   Neck mass 03/24/2015   Hypothyroidism 03/24/2015   Gout 03/24/2015   Essential hypertension 03/24/2015   Asthma 03/24/2015   Peripheral neuropathy 03/24/2015   Osteoporosis 03/24/2015   Morbid obesity (HCC) 03/24/2015   Long term  current use of anticoagulant therapy 03/16/2015   Lymphadenopathy, cervical 03/16/2015   Diarrhea 03/16/2015   Hyperbilirubinemia 03/16/2015   Coronary arteriosclerosis 09/22/2014   Hyperlipidemia 07/23/2013   Hypokalemia 02/18/2013   Elevated bilirubin 02/18/2013   Morbid obesity with BMI of 45.0-49.9, adult (HCC) 10/29/2012   Rotator cuff tear arthropathy 10/29/2012   DJD (degenerative joint disease) of knee 10/29/2012   Plantar fasciitis    Arthritis    Hx of radiation therapy    Atrial fibrillation, chronic (HCC) 01/21/2011   Malignant neoplasm of upper-outer quadrant of left breast in female, estrogen receptor positive (HCC) 12/26/2010   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  Can't get my head straight"  Pt accompanied by: self  PERTINENT HISTORY: [name],  is a 87 y.o. female who presents for initial evaluation of a head injury and multiple contusions. MOI: She fell/rolled out of bed due to her nightgown being tangled in the bedding. She was transported via EMS to the [facility] ED and dx w/ a levator scapula hematoma. Today, pt c/o cont'd confusion, soreness in her neck, shoulders, and thoracic spine. Her daughter notes a "bump" on the back of pt's head.   PAIN:  Are you having pain? Yes: NPRS scale: 8/10 Pain location: "the whole neck"  Pain description: ache Aggravating factors: worse today after stiffness in the bed today; activity, overhead movements  Relieving factors:  extra strength tylenol  PRECAUTIONS: None  WEIGHT BEARING RESTRICTIONS: No  FALLS: Has patient fallen in last 6 months? Yes. Number of falls 1  LIVING ENVIRONMENT: Lives with: lives alone Lives in: House/apartment Stairs: Yes: Internal: 2-3 steps; can reach both and typically uses rollator , ramp for entering/exiting home Has following equipment at home: Dan Humphreys - 4 wheeled  PLOF: Independent with household mobility with device and Independent with community mobility with device  PATIENT GOALS: reduce pain  OBJECTIVE:   TODAY'S TREATMENT: 01/15/23 Activity Comments  Seated VOR x 1 Reports limited by neck pain and "brain fog" but not dizzy/HA  Static standing balance -limited by neck pain  Soft tissue mobilization to cervical spine to reduce pain and address spasm/trigger point/limited ROM -prominent trigger point left levator scap. Scalene/upper trap. Decreased right rotation C-spine vs left rotation.  TODAY'S TREATMENT: 01/09/23 Activity Comments  sitting VOR horizontal 30"  Difficulty coordinating continuous motion; No dizziness  sitting VOR vertical 30" No dizziness  sitting VOR cancellation 30" No dizziness  brandt daroff R/L No dizziness; cueing for proper alignment   Sitting anterior canal habituation R/L EO and EC Transient c/o feeling like she is falling on L- could not replicate this   romberg EO/EC With 4WW in front; occasional UE support d/t imbalance   romberg + head turns/nods With 4WW in front; occasional UE support d/t imbalance   STM and manual TPR to cervical musculature  Large palpabe trigger pts and taut muscle bands in scalenes, UT, suboccipitals, LS; pt reported "I feel so much better"        HOME EXERCISE PROGRAM Last updated: 12/12/22 Access Code: [email or web address] URL: [email or web address] Date: 12/12/2022 Prepared by: [facility] - Outpatient  Rehab - [facility]  Exercises - Seated Cervical Sidebending Stretch  - 1 x  daily - 7 x weekly - 3 sets - 30-60 sec hold - Seated Upper Trap Stretch  - 1 x daily - 7 x weekly - 3 sets - 30-60 sec hold - Cervical Retraction with Overpressure  - 1 x daily - 5 x weekly - 2 sets - 10 reps - 3 sec hold - Seated Assisted Cervical Rotation with Towel  - 1 x daily - 5 x weekly - 2 sets - 10 reps - Mid-Lower Cervical Extension SNAG with Strap  - 1 x daily - 5 x weekly - 2 sets - 10 reps - Romberg Stance with Eyes Closed  - 1 x daily - 5 x weekly - 2 sets - 30 sec hold - Romberg Stance with Head Nods  - 1 x daily - 5 x weekly - 2 sets - 30 sec hold - Supine Cervical Retraction with Towel  - 1 x daily - 7 x weekly - 2 sets - 5 reps - 3 sec hold - Standing Toe Taps  - 1 x daily - 5 x weekly - 2 sets - 10 reps - Corner Balance Feet Together With Eyes Open  - 1 x daily - 5 x weekly - 2 sets - 30 sec hold - Corner Balance Feet Together With Eyes Closed  - 1 x daily - 5 x weekly - 2 sets - 30 sec hold - Shoulder External Rotation and Scapular Retraction with Resistance  - 1 x daily - 7 x weekly - 1-3 sets - 10 reps - Seated Shoulder Horizontal Abduction with Resistance  - 1 x daily - 7 x weekly - 3 sets - 10 reps - Pencil Pushups  - 1 x daily - 7 x weekly - 1-3 sets - 10 reps     Below measures were taken at time of initial evaluation unless otherwise specified:   DIAGNOSTIC FINDINGS:  IMPRESSION: No acute bony abnormality. Degenerative disc and facet disease throughout the cervical spine.   Enlargement of the left levator scapulae muscle with surrounding stranding most compatible with intramuscular hematoma.  COGNITION: Overall cognitive status: Within functional limits for tasks assessed   SENSATION: WNL  EDEMA:    MUSCLE TONE:  WNL   POSTURE:  No Significant postural limitations  Cervical ROM:    Active A/PROM (deg) eval  Flexion 50  Extension 40  Right lateral flexion 15  Left lateral flexion 15  Right rotation 15  Left rotation 20  (Blank rows =  not tested)  UE ROM: WFL--able to elevate arms overhead full  ROM but with discomfort in LUE  STRENGTH: 5/5 gross BUE  PALPATION:  multiple areas of tenderness/trigger points throughout left shoulder girdle and neck--more soft tissue than joint discomfort of the area  BED MOBILITY:  independent  TRANSFERS: Assistive device utilized: Environmental consultant - 4 wheeled  Sit to stand: Modified independence Stand to sit: Modified independence Chair to chair: Modified independence Floor:  NT  RAMP: Modified independence  CURB: Modified independence  GAIT: Gait pattern: WFL Distance walked:  Assistive device utilized: Environmental consultant - 4 wheeled Level of assistance: Modified independence Comments:   FUNCTIONAL TESTS:  Timed up and go (TUG): 23 sec w/ rollator Dynamic Gait Index: 13/24  M-CTSIB  Condition 1: Firm Surface, EO 30 Sec, Normal and Mild Sway  Condition 2: Firm Surface, EC 10 Sec, Moderate and Severe Sway  Condition 3: Foam Surface, EO 30 Sec, Normal Sway  Condition 4: Foam Surface, EC 4 Sec, Severe Sway        GOALS: Goals reviewed with patient? Yes  SHORT TERM GOALS: Target date: 11/15/2022    Patient will be independent in HEP to improve functional outcomes Baseline: Goal status: MET    LONG TERM GOALS: Target date: 02/12/2023      Exhibit improved C-spine ROM to 25 degrees lateral flexion and 30+ degrees rotation to improve comfort and safety when driving Baseline:  Flexion: 45 deg Extension: 30-40 Right lateral flexion: 20-25  Left lateral: 20 Right rotation: 20 deg Left rotation: 25 deg Goal status: IN PROGRESS  2.  Report left shoulder pain not exceeding 3/10 with overhead movements to improve comfort with household activity and self-care tasks Baseline: 2-3/10 Goal status: MET  3.  Demo reduced risk for falls per time 15 sec TUG test Baseline: 23 sec w/ rollator; (12/03/22) 21 sec w/ rollator Goal status: IN PROGRESS  4.  Improved postural stability per  mild sway conditions 2 and 4 M-CTSIB x 15 sec to improve safety with ADL Baseline: modertate x 20 sec Goal status: IN PROGRESS  5.  Demo reduced risk for falls per score 19/24 Dynamic Gait Index  Baseline: 15/24  Goal status: INITIAL  ASSESSMENT:  CLINICAL IMPRESSION: Initiaated activities for VOR with report of increased neck pain. Continued with soft tissue mobilization to address neck pain and trigger points along left cervical column and scapula.  Reports improved symptoms by end of session from initial 8/10 to 4/10. Continued sessions as tolerated   OBJECTIVE IMPAIRMENTS: decreased activity tolerance, decreased balance, decreased ROM, increased muscle spasms, impaired flexibility, and pain.   ACTIVITY LIMITATIONS: carrying, bending, sleeping, dressing, and reach over head  PARTICIPATION LIMITATIONS: meal prep, cleaning, laundry, driving, shopping, and community activity  PERSONAL FACTORS: Time since onset of injury/illness/exacerbation and 1-2 comorbidities: knee OA  are also affecting patient's functional outcome.   REHAB POTENTIAL: Excellent  CLINICAL DECISION MAKING: Stable/uncomplicated  EVALUATION COMPLEXITY: Low   PLAN:  PT FREQUENCY: 1-2x/week, plan is for 1x/wk if neck pain does not continue to limit  PT DURATION: 6 weeks  PLANNED INTERVENTIONS: Therapeutic exercises, Therapeutic activity, Neuromuscular re-education, Balance training, Gait training, Patient/Family education, Self Care, Joint mobilization, Stair training, Vestibular training, Canalith repositioning, DME instructions, Aquatic Therapy, Dry Needling, Electrical stimulation, Spinal mobilization, Cryotherapy, Moist heat, Traction, Ultrasound, Ionotophoresis 4mg /ml Dexamethasone, and Manual therapy  PLAN FOR NEXT SESSION: balance activities, Cervical traction   4:15 PM, 01/15/23 M. Shary Decamp, PT, DPT Physical Therapist- [place] Office Number: [phone]

## 2023-01-16 IMAGING — CT CT CHEST HIGH RESOLUTION
3 of 7 series · 15 of 36 positions shown, 18 images · non-contrast
Comparison: 06/15/2021 and 03/27/2015.

CLINICAL DATA: Interstitial lung disease, chronic cough and
shortness of breath. On supplemental oxygen.



[Series 3: high resolution 2.0 lung · axial · 0.69mm/px · z∈[-296,-242]mm · 2 of 161 slices shown]
[im 27/161  lung]
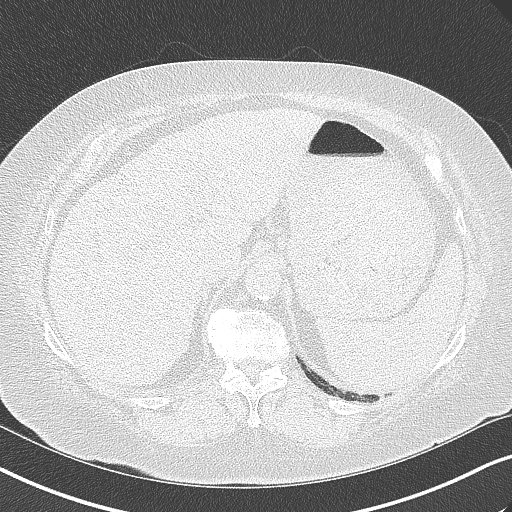
[im 54/161  lung]
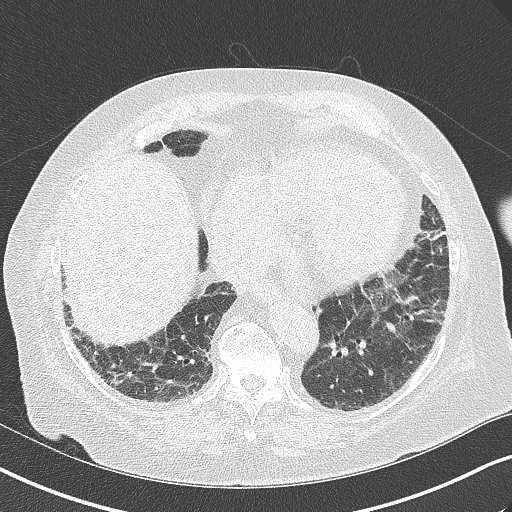

[Series 4: high resolution · axial · 0.69mm/px · z∈[-296,-56]mm · 10 of 322 slices shown, 13 images]
[im 54/322  mediastinal]
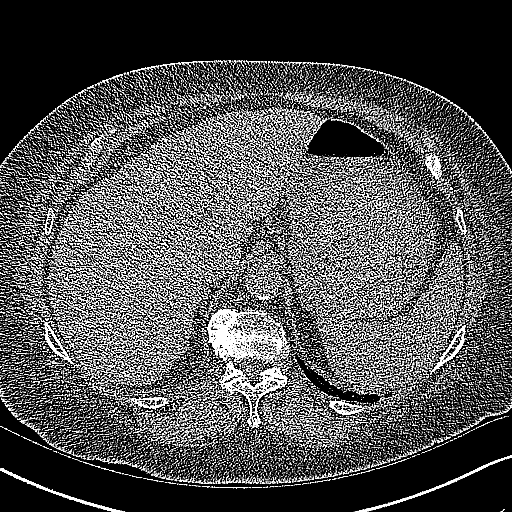
[im 54/322  lung]
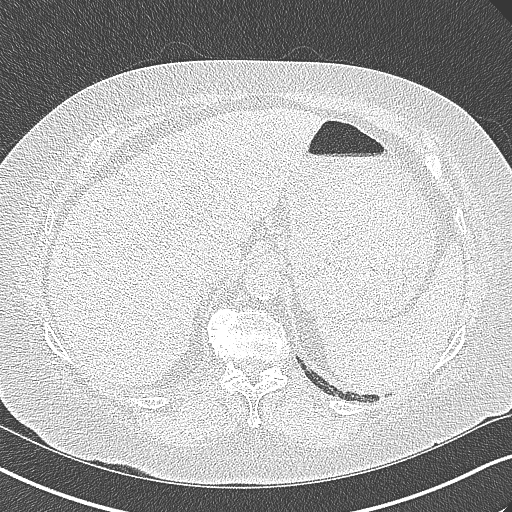
[im 81/322  lung]
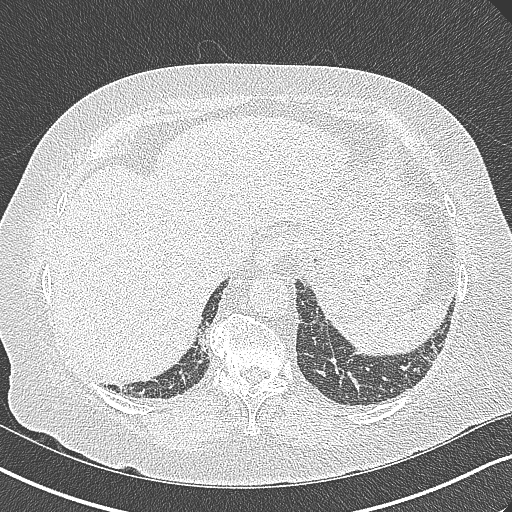
[im 108/322  lung]
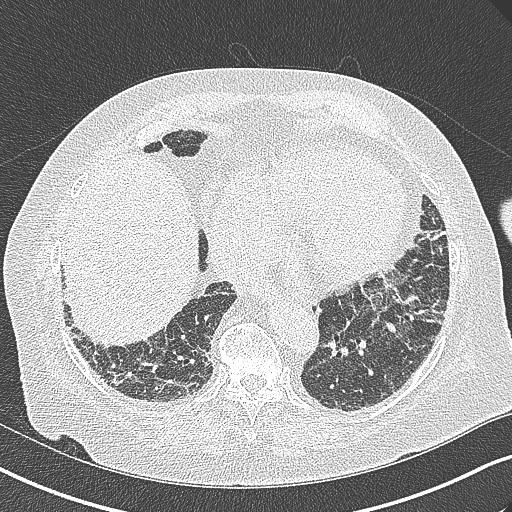
[im 134/322  lung]
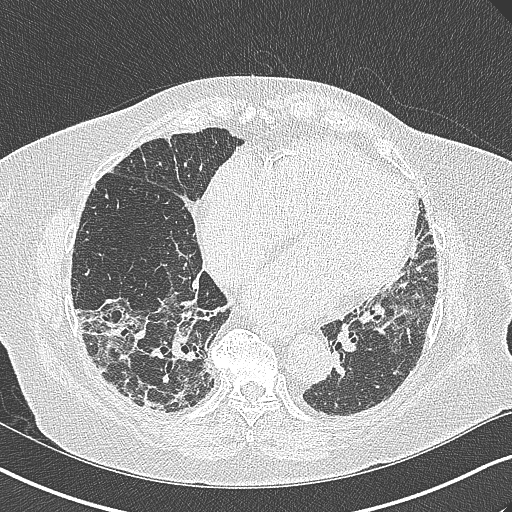
[im 161/322  mediastinal]
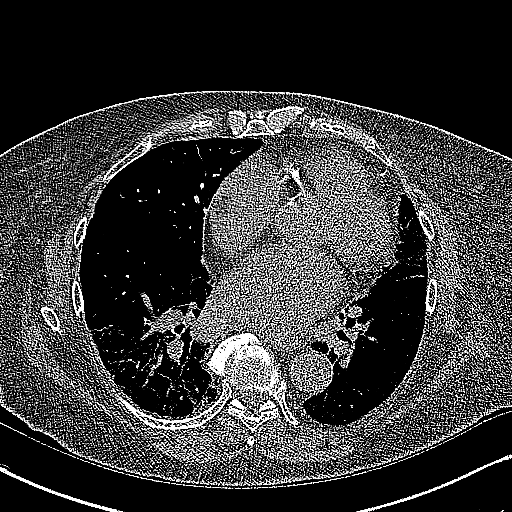
[im 161/322  lung]
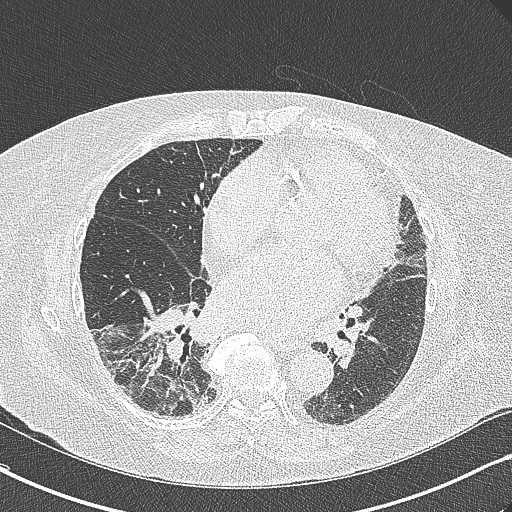
[im 188/322  lung]
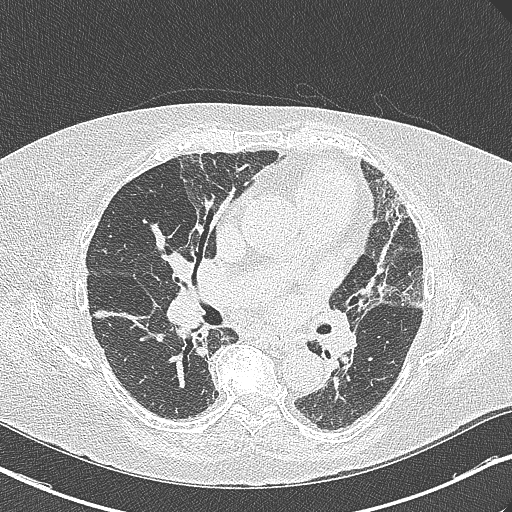
[im 215/322  lung]
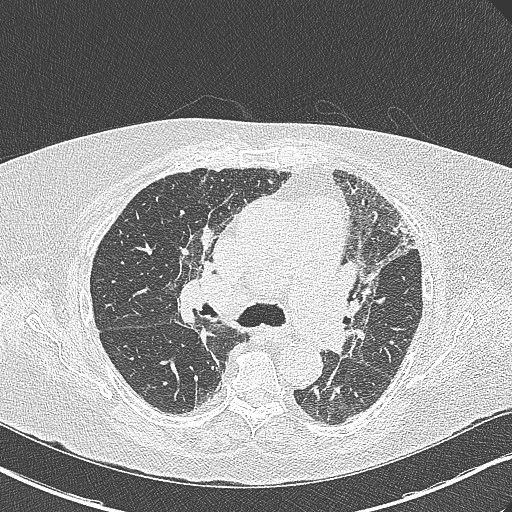
[im 241/322  lung]
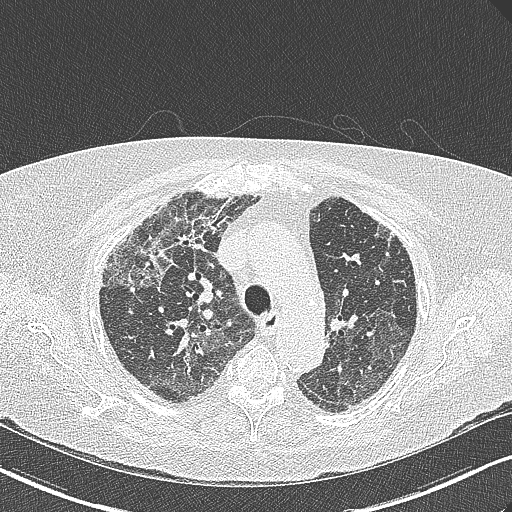
[im 268/322  mediastinal]
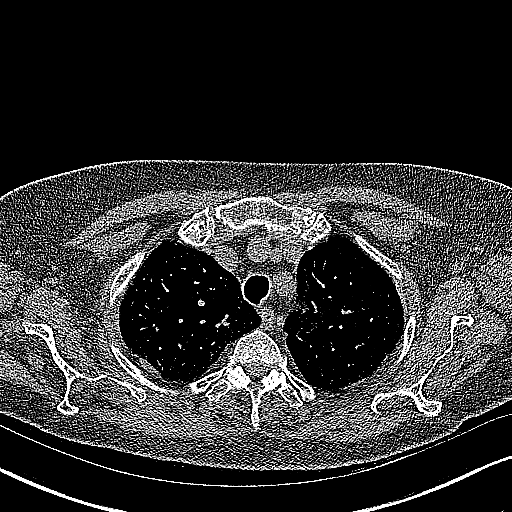
[im 268/322  lung]
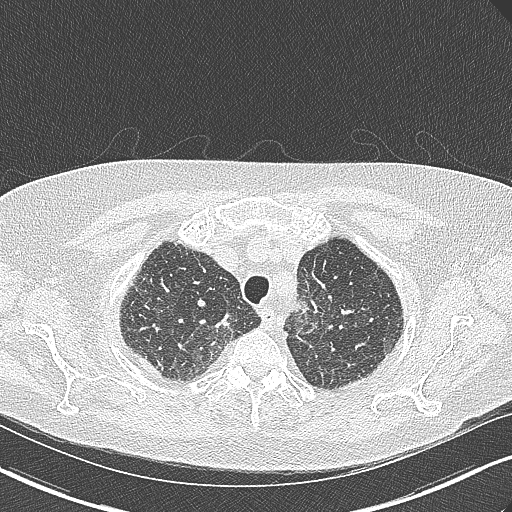
[im 295/322  lung]
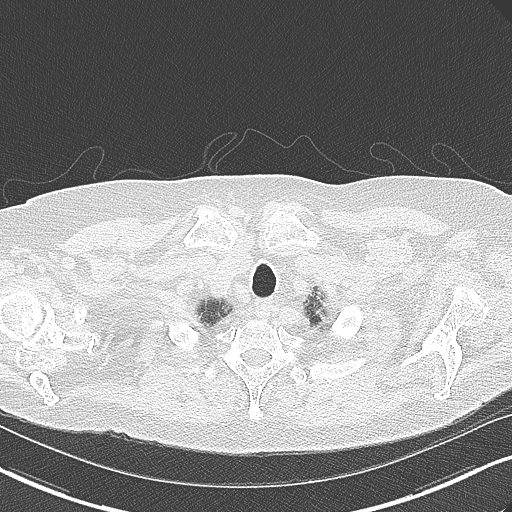

[Series 7: coronal · coronal · 0.65mm/px · 3 of 126 slices shown]
[im 26/126  lung]
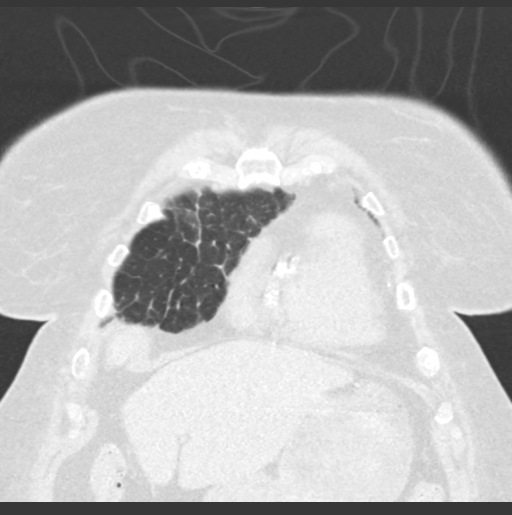
[im 51/126  lung]
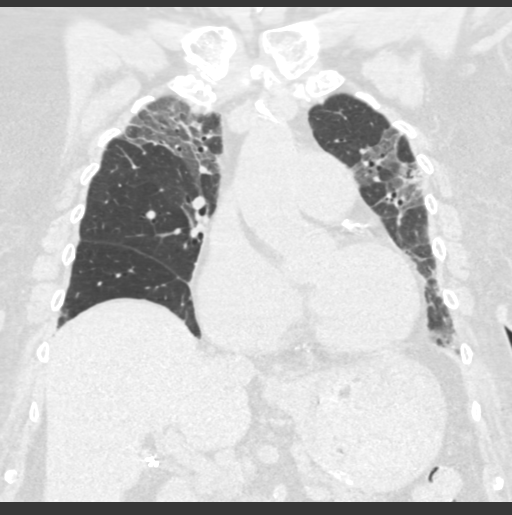
[im 76/126  lung]
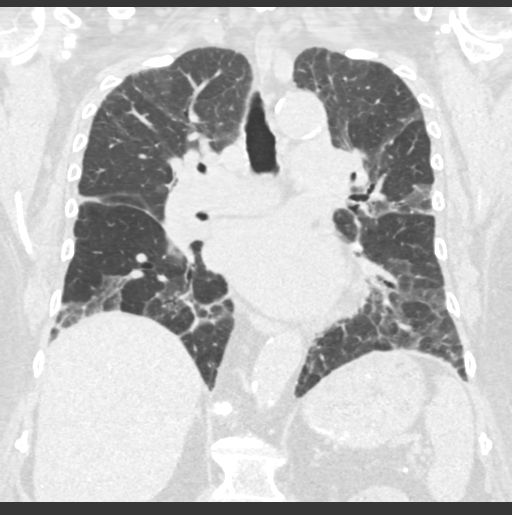

[15 of 36 positions shown; findings below may reference images not displayed]

FINDINGS: Cardiovascular: Atherosclerotic calcification of the aorta, aortic
valve and coronary arteries. Enlarged pulmonic trunk and heart. No
pericardial effusion.

Mediastinum/Nodes: No pathologically enlarged mediastinal or
axillary lymph nodes. Hilar regions are difficult to definitively
evaluate without IV contrast. Hilar regions are difficult to
definitively evaluate without IV contrast. Calcified right hilar
lymph node. Esophagus is grossly unremarkable.

Lungs/Pleura: Patchy pulmonary parenchymal ground-glass with
coarsening and mild architectural distortion. Associated traction
bronchiectasis/bronchiolectasis. Subpleural reticulation is not an
overwhelming feature. No honeycombing. Findings are new from
03/27/2015. No suspicious pulmonary nodules. No pleural fluid.
Debris is seen in the airway. There is air trapping.

Upper Abdomen: Liver margin is irregular. Cholecystectomy.
Visualized portions of the adrenal glands, kidneys, spleen,
pancreas, stomach and bowel are unremarkable.

Musculoskeletal: Degenerative changes in the spine. No worrisome
lytic or sclerotic lesions. Degenerative changes in the shoulders,
right greater than left. Old left rib fracture.
IMPRESSION: 1. Pulmonary parenchymal pattern of fibrosis may be due to fibrotic
nonspecific interstitial pneumonitis. The possibility of post COVID
19 inflammatory fibrosis is not excluded. Findings are suggestive of
an alternative diagnosis (not UIP) per consensus guidelines:
Diagnosis of Idiopathic Pulmonary Fibrosis: An Official
ATS/ERS/JRS/ALAT Clinical Practice Guideline. Am J Respir Crit Care
Med Vol 198, Baske 5, [id number][DATE].
2. Cirrhosis.
3. Aortic atherosclerosis ([id number]). Coronary artery
calcification.
4. Enlarged pulmonic trunk, indicative of pulmonary arterial
hypertension.

## 2023-01-17 ENCOUNTER — Ambulatory Visit
Admission: RE | Admit: 2023-01-17 | Discharge: 2023-01-17 | Disposition: A | Discharge status: Home / Self Care | Payer: Medicare Other | Source: Ambulatory Visit | Attending: Family Medicine | Admitting: Family Medicine

## 2023-01-17 DIAGNOSIS — H5111 Convergence insufficiency: Secondary | ICD-10-CM

## 2023-01-17 DIAGNOSIS — M542 Cervicalgia: Secondary | ICD-10-CM

## 2023-01-17 DIAGNOSIS — R42 Dizziness and giddiness: Secondary | ICD-10-CM

## 2023-01-17 DIAGNOSIS — S060X0D Concussion without loss of consciousness, subsequent encounter: Secondary | ICD-10-CM

## 2023-01-17 DIAGNOSIS — S12121A Other nondisplaced dens fracture, initial encounter for closed fracture: Secondary | ICD-10-CM | POA: Diagnosis not present

## 2023-01-17 DIAGNOSIS — M4802 Spinal stenosis, cervical region: Secondary | ICD-10-CM | POA: Diagnosis not present

## 2023-01-17 DIAGNOSIS — R519 Headache, unspecified: Secondary | ICD-10-CM | POA: Diagnosis not present

## 2023-01-17 DIAGNOSIS — R29818 Other symptoms and signs involving the nervous system: Secondary | ICD-10-CM | POA: Diagnosis not present

## 2023-01-22 ENCOUNTER — Ambulatory Visit: Payer: Medicare Other

## 2023-01-22 DIAGNOSIS — R262 Difficulty in walking, not elsewhere classified: Secondary | ICD-10-CM

## 2023-01-22 DIAGNOSIS — M542 Cervicalgia: Secondary | ICD-10-CM | POA: Diagnosis not present

## 2023-01-22 DIAGNOSIS — R42 Dizziness and giddiness: Secondary | ICD-10-CM | POA: Diagnosis not present

## 2023-01-22 DIAGNOSIS — M6281 Muscle weakness (generalized): Secondary | ICD-10-CM

## 2023-01-22 DIAGNOSIS — R2681 Unsteadiness on feet: Secondary | ICD-10-CM | POA: Diagnosis not present

## 2023-01-22 NOTE — Therapy (Signed)
OUTPATIENT PHYSICAL THERAPY VESTIBULAR TREATMENT     Patient Name: [name] MRN: [id number] DOB:Nov 07, 1935, 87 y.o., female Today's Date: 01/22/2023    END OF SESSION:  PT End of Session - 01/22/23 1453     Visit Number 17    Number of Visits 26    Date for PT Re-Evaluation 02/11/23    Authorization Type [employer]    Progress Note Due on Visit 24    PT Start Time 1452    PT Stop Time 1530    PT Time Calculation (min) 38 min    Equipment Utilized During Treatment Gait belt    Activity Tolerance Patient tolerated treatment well    Behavior During Therapy WFL for tasks assessed/performed                    Past Medical History:  Diagnosis Date   Allergy    Anxiety    Arthritis    no cartlidge in knees   Asthma    Bradycardia    a. atenolol stopped due to this, HR 40s.   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  CAD s/p RCA stent 2000 with residual mild-mod disease of left system 2001.   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   Osteoporosis 03/24/2015   Peripheral neuropathy 03/24/2015   Plantar fasciitis    Sleep apnea    Past Surgical History:  Procedure Laterality Date   BREAST LUMPECTOMY W/ NEEDLE LOCALIZATION  01/29/2011   Left with [facility] Dr Jamey Ripa   CHOLECYSTECTOMY  1955   CORONARY ANGIOPLASTY WITH STENT PLACEMENT  2000   DILATION AND CURETTAGE OF UTERUS  1976   and 1996   HAND SURGERY  1992   tendons between thumb and forefinger   SKIN BIOPSY     1994, 2006, 2008, 2010, 2011, 2011, 2012-  pre-cancerous   TONSILLECTOMY  1955   Patient Active Problem List    Diagnosis Date Noted   Acute renal failure superimposed on chronic kidney disease (HCC) 12/16/2022   Cloudy urine 11/25/2022   Insomnia 07/27/2022   Vitamin D deficiency 07/27/2022   Pulmonary hypertension (HCC) 11/07/2021   Bronchiectasis without complication (HCC) 11/07/2021   Hepatic cirrhosis (HCC) 11/06/2021   ILD (interstitial lung disease) (HCC) 10/03/2021   Abnormal LFTs 09/28/2021   Weight loss 08/19/2021   Chronic respiratory failure with hypoxia (HCC) 08/19/2021   Debility 06/29/2021   Multifocal pneumonia 06/15/2021   Elevated troponin 06/12/2021   Febrile illness 05/22/2021   Anxiety state 10/23/2020   Pure hypercholesterolemia 10/23/2020   Urinary tract infectious disease 10/23/2020   Atherosclerosis of coronary artery 10/23/2020   Sinus node dysfunction (HCC) 10/23/2020   Mixed hyperlipidemia 10/23/2020   OSA on CPAP 10/23/2020   Fall 09/05/2020   Nausea 04/23/2020   Constipation 04/23/2020   Posterior uveitis, 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   Dysphagia 01/21/2020   CHF (congestive heart failure) (HCC) 11/23/2019   Exposure to COVID-19 virus 12/04/2018   Osteoarthritis 09/19/2018   Abscess of left leg 09/26/2017   Left leg cellulitis 09/18/2017  Whiplash 06/10/2017   Cough 05/29/2017   Concussion 05/29/2017   Hammer toes of both feet 12/19/2016   Allergic rhinitis 12/09/2016   Pedal edema 09/17/2016   Chest pain 07/18/2016   Gross hematuria 04/26/2016   Prediabetes 03/21/2016   Dysuria 12/07/2015   Fever blister 12/07/2015   Choroidal nevus, 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   Malignant neoplasm of left female breast (HCC) 10/10/2015   Anxiety and depression 06/30/2015   Encounter for well adult exam with abnormal findings 06/30/2015   Neck mass 03/24/2015   Hypothyroidism 03/24/2015   Gout 03/24/2015   Essential hypertension 03/24/2015   Asthma 03/24/2015   Peripheral neuropathy 03/24/2015   Osteoporosis 03/24/2015   Morbid obesity (HCC) 03/24/2015   Long term  current use of anticoagulant therapy 03/16/2015   Lymphadenopathy, cervical 03/16/2015   Diarrhea 03/16/2015   Hyperbilirubinemia 03/16/2015   Coronary arteriosclerosis 09/22/2014   Hyperlipidemia 07/23/2013   Hypokalemia 02/18/2013   Elevated bilirubin 02/18/2013   Morbid obesity with BMI of 45.0-49.9, adult (HCC) 10/29/2012   Rotator cuff tear arthropathy 10/29/2012   DJD (degenerative joint disease) of knee 10/29/2012   Plantar fasciitis    Arthritis    Hx of radiation therapy    Atrial fibrillation, chronic (HCC) 01/21/2011   Malignant neoplasm of upper-outer quadrant of left breast in female, estrogen receptor positive (HCC) 12/26/2010   Breast cancer (ILC) Receptor + her2 _ 12/26/2010    PCP: [name], MD REFERRING PROVIDER: Rodolph Bong, MD  REFERRING DIAG:  S06.0X0D (ICD-10-CM) - Concussion without loss of consciousness, subsequent encounter    THERAPY DIAG:  Muscle weakness (generalized)  Unsteadiness on feet  Dizziness and giddiness  Neck pain  Difficulty in walking, not elsewhere classified  ONSET DATE: 10/15/22  Rationale for Evaluation and Treatment: Rehabilitation  SUBJECTIVE:   SUBJECTIVE STATEMENT: Had MRI which went well, don't know the results yet.  Having good days and bad days.  The main issue is neck pain.  No dizziness to speak of.  Balance is slightly improved.   Pt accompanied by: self  PERTINENT HISTORY: [name],  is a 87 y.o. female who presents for initial evaluation of a head injury and multiple contusions. MOI: She fell/rolled out of bed due to her nightgown being tangled in the bedding. She was transported via EMS to the [facility] ED and dx w/ a levator scapula hematoma. Today, pt c/o cont'd confusion, soreness in her neck, shoulders, and thoracic spine. Her daughter notes a "bump" on the back of pt's head.   PAIN:  Are you having pain? Yes: NPRS scale: 3-4/10 Pain location: "the whole neck"  Pain description:  ache Aggravating factors: worse today after stiffness in the bed today; activity, overhead movements  Relieving factors: extra strength tylenol  PRECAUTIONS: None  WEIGHT BEARING RESTRICTIONS: No  FALLS: Has patient fallen in last 6 months? Yes. Number of falls 1  LIVING ENVIRONMENT: Lives with: lives alone Lives in: House/apartment Stairs: Yes: Internal: 2-3 steps; can reach both and typically uses rollator , ramp for entering/exiting home Has following equipment at home: Dan Humphreys - 4 wheeled  PLOF: Independent with household mobility with device and Independent with community mobility with device  PATIENT GOALS: reduce pain  OBJECTIVE:   TODAY'S TREATMENT: 01/22/23 Activity Comments  Standing balance on firm/foam -EO/EC x 30 sec -head/trunk turns 3x3 EO/EC  Seated rows 1x20 10# 1x10 25# 1x10 30#  Lat row  1x10 30# 1x10 35#  Standing on stool placing items to overhead  cabinets 1x10 -overhead and upward gaze reaching  Soft tissue mobilization Trigger point release and soft tissue mobilization bilateral cervical column and scapula to address pain/trigger points LUE/RUE        TODAY'S TREATMENT: 01/15/23 Activity Comments  Seated VOR x 1 Reports limited by neck pain and "brain fog" but not dizzy/HA  Static standing balance -limited by neck pain  Soft tissue mobilization to cervical spine to reduce pain and address spasm/trigger point/limited ROM -prominent trigger point left levator scap. Scalene/upper trap. Decreased right rotation C-spine vs left rotation.                     HOME EXERCISE PROGRAM Last updated: 12/12/22 Access Code: [email or web address] URL: [email or web address] Date: 12/12/2022 Prepared by: [facility] - [facility]  Exercises - Seated Cervical Sidebending Stretch  - 1 x daily - 7 x weekly - 3 sets - 30-60 sec hold - Seated Upper Trap Stretch  - 1 x daily - 7 x weekly - 3 sets - 30-60 sec hold - Cervical Retraction  with Overpressure  - 1 x daily - 5 x weekly - 2 sets - 10 reps - 3 sec hold - Seated Assisted Cervical Rotation with Towel  - 1 x daily - 5 x weekly - 2 sets - 10 reps - Mid-Lower Cervical Extension SNAG with Strap  - 1 x daily - 5 x weekly - 2 sets - 10 reps - Romberg Stance with Eyes Closed  - 1 x daily - 5 x weekly - 2 sets - 30 sec hold - Romberg Stance with Head Nods  - 1 x daily - 5 x weekly - 2 sets - 30 sec hold - Supine Cervical Retraction with Towel  - 1 x daily - 7 x weekly - 2 sets - 5 reps - 3 sec hold - Standing Toe Taps  - 1 x daily - 5 x weekly - 2 sets - 10 reps - Corner Balance Feet Together With Eyes Open  - 1 x daily - 5 x weekly - 2 sets - 30 sec hold - Corner Balance Feet Together With Eyes Closed  - 1 x daily - 5 x weekly - 2 sets - 30 sec hold - Shoulder External Rotation and Scapular Retraction with Resistance  - 1 x daily - 7 x weekly - 1-3 sets - 10 reps - Seated Shoulder Horizontal Abduction with Resistance  - 1 x daily - 7 x weekly - 3 sets - 10 reps - Pencil Pushups  - 1 x daily - 7 x weekly - 1-3 sets - 10 reps     Below measures were taken at time of initial evaluation unless otherwise specified:   DIAGNOSTIC FINDINGS:  IMPRESSION: No acute bony abnormality. Degenerative disc and facet disease throughout the cervical spine.   Enlargement of the left levator scapulae muscle with surrounding stranding most compatible with intramuscular hematoma.  COGNITION: Overall cognitive status: Within functional limits for tasks assessed   SENSATION: WNL  EDEMA:    MUSCLE TONE:  WNL   POSTURE:  No Significant postural limitations  Cervical ROM:    Active A/PROM (deg) eval  Flexion 50  Extension 40  Right lateral flexion 15  Left lateral flexion 15  Right rotation 15  Left rotation 20  (Blank rows = not tested)  UE ROM: WFL--able to elevate arms overhead full ROM but with discomfort in LUE  STRENGTH: 5/5 gross BUE  PALPATION:  multiple areas  of tenderness/trigger points throughout left shoulder girdle and neck--more soft tissue than joint discomfort of the area  BED MOBILITY:  independent  TRANSFERS: Assistive device utilized: Environmental consultant - 4 wheeled  Sit to stand: Modified independence Stand to sit: Modified independence Chair to chair: Modified independence Floor:  NT  RAMP: Modified independence  CURB: Modified independence  GAIT: Gait pattern: WFL Distance walked:  Assistive device utilized: Environmental consultant - 4 wheeled Level of assistance: Modified independence Comments:   FUNCTIONAL TESTS:  Timed up and go (TUG): 23 sec w/ rollator Dynamic Gait Index: 13/24  M-CTSIB  Condition 1: Firm Surface, EO 30 Sec, Normal and Mild Sway  Condition 2: Firm Surface, EC 10 Sec, Moderate and Severe Sway  Condition 3: Foam Surface, EO 30 Sec, Normal Sway  Condition 4: Foam Surface, EC 4 Sec, Severe Sway        GOALS: Goals reviewed with patient? Yes  SHORT TERM GOALS: Target date: 11/15/2022    Patient will be independent in HEP to improve functional outcomes Baseline: Goal status: MET    LONG TERM GOALS: Target date: 02/12/2023      Exhibit improved C-spine ROM to 25 degrees lateral flexion and 30+ degrees rotation to improve comfort and safety when driving Baseline:  Flexion: 45 deg Extension: 30-40 Right lateral flexion: 20-25  Left lateral: 20 Right rotation: 20 deg Left rotation: 25 deg Goal status: IN PROGRESS  2.  Report left shoulder pain not exceeding 3/10 with overhead movements to improve comfort with household activity and self-care tasks Baseline: 2-3/10 Goal status: MET  3.  Demo reduced risk for falls per time 15 sec TUG test Baseline: 23 sec w/ rollator; (12/03/22) 21 sec w/ rollator Goal status: IN PROGRESS  4.  Improved postural stability per mild sway conditions 2 and 4 M-CTSIB x 15 sec to improve safety with ADL Baseline: modertate x 20 sec Goal status: IN PROGRESS  5.  Demo reduced risk  for falls per score 19/24 Dynamic Gait Index  Baseline: 15/24  Goal status: INITIAL  ASSESSMENT:  CLINICAL IMPRESSION: Pt notes general improvement in HA, neck pain, and cognitive "brain fog" that has been presnt.  Able to progress multi-sensory balance activities without adverse effects and able to initiate upper body strength training today prioriting compound movements for arm/scapular strength and efficiency. Activities to simulate safe standing on stool and accessing overhead cabinets to improve safety with home keeping.  Ended session with soft tissue mobilization to address pain/trigger points affecting left levator scap and cevrical column to improve range and reduce pain/guarding   OBJECTIVE IMPAIRMENTS: decreased activity tolerance, decreased balance, decreased ROM, increased muscle spasms, impaired flexibility, and pain.   ACTIVITY LIMITATIONS: carrying, bending, sleeping, dressing, and reach over head  PARTICIPATION LIMITATIONS: meal prep, cleaning, laundry, driving, shopping, and community activity  PERSONAL FACTORS: Time since onset of injury/illness/exacerbation and 1-2 comorbidities: knee OA  are also affecting patient's functional outcome.   REHAB POTENTIAL: Excellent  CLINICAL DECISION MAKING: Stable/uncomplicated  EVALUATION COMPLEXITY: Low   PLAN:  PT FREQUENCY: 1-2x/week, plan is for 1x/wk if neck pain does not continue to limit  PT DURATION: 6 weeks  PLANNED INTERVENTIONS: Therapeutic exercises, Therapeutic activity, Neuromuscular re-education, Balance training, Gait training, Patient/Family education, Self Care, Joint mobilization, Stair training, Vestibular training, Canalith repositioning, DME instructions, Aquatic Therapy, Dry Needling, Electrical stimulation, Spinal mobilization, Cryotherapy, Moist heat, Traction, Ultrasound, Ionotophoresis 4mg /ml Dexamethasone, and Manual therapy  PLAN FOR NEXT SESSION: balance activities, STM as indicated   2:54 PM,  01/22/23  M. Shary Decamp, PT, DPT Physical Therapist- [place] Office Number: [phone]

## 2023-01-29 ENCOUNTER — Ambulatory Visit: Payer: Medicare Other

## 2023-01-29 DIAGNOSIS — R42 Dizziness and giddiness: Secondary | ICD-10-CM | POA: Diagnosis not present

## 2023-01-29 DIAGNOSIS — R2681 Unsteadiness on feet: Secondary | ICD-10-CM | POA: Diagnosis not present

## 2023-01-29 DIAGNOSIS — M6281 Muscle weakness (generalized): Secondary | ICD-10-CM | POA: Diagnosis not present

## 2023-01-29 DIAGNOSIS — M542 Cervicalgia: Secondary | ICD-10-CM

## 2023-01-29 DIAGNOSIS — R262 Difficulty in walking, not elsewhere classified: Secondary | ICD-10-CM

## 2023-01-29 NOTE — Therapy (Signed)
OUTPATIENT PHYSICAL THERAPY VESTIBULAR TREATMENT     Patient Name: [name] MRN: [id number] DOB:November 24, 1935, 87 y.o., female Today's Date: 01/29/2023    END OF SESSION:  PT End of Session - 01/29/23 1452     Visit Number 18    Number of Visits 26    Date for PT Re-Evaluation 02/11/23    Authorization Type [employer]    Progress Note Due on Visit 24    PT Start Time 1450    PT Stop Time 1530    PT Time Calculation (min) 40 min    Equipment Utilized During Treatment Gait belt    Activity Tolerance Patient tolerated treatment well    Behavior During Therapy WFL for tasks assessed/performed                    Past Medical History:  Diagnosis Date   Allergy    Anxiety    Arthritis    no cartlidge in knees   Asthma    Bradycardia    a. atenolol stopped due to this, HR 40s.   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  CAD s/p RCA stent 2000 with residual mild-mod disease of left system 2001.   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   Osteoporosis 03/24/2015   Peripheral neuropathy 03/24/2015   Plantar fasciitis    Sleep apnea    Past Surgical History:  Procedure Laterality Date   BREAST LUMPECTOMY W/ NEEDLE LOCALIZATION  01/29/2011   Left with [facility] Dr Jamey Ripa   CHOLECYSTECTOMY  1955   CORONARY ANGIOPLASTY WITH STENT PLACEMENT  2000   DILATION AND CURETTAGE OF UTERUS  1976   and 1996   HAND SURGERY  1992   tendons between thumb and forefinger   SKIN BIOPSY     1994, 2006, 2008, 2010, 2011, 2011, 2012-  pre-cancerous   TONSILLECTOMY  1955   Patient Active Problem List    Diagnosis Date Noted   Acute renal failure superimposed on chronic kidney disease (HCC) 12/16/2022   Cloudy urine 11/25/2022   Insomnia 07/27/2022   Vitamin D deficiency 07/27/2022   Pulmonary hypertension (HCC) 11/07/2021   Bronchiectasis without complication (HCC) 11/07/2021   Hepatic cirrhosis (HCC) 11/06/2021   ILD (interstitial lung disease) (HCC) 10/03/2021   Abnormal LFTs 09/28/2021   Weight loss 08/19/2021   Chronic respiratory failure with hypoxia (HCC) 08/19/2021   Debility 06/29/2021   Multifocal pneumonia 06/15/2021   Elevated troponin 06/12/2021   Febrile illness 05/22/2021   Anxiety state 10/23/2020   Pure hypercholesterolemia 10/23/2020   Urinary tract infectious disease 10/23/2020   Atherosclerosis of coronary artery 10/23/2020   Sinus node dysfunction (HCC) 10/23/2020   Mixed hyperlipidemia 10/23/2020   OSA on CPAP 10/23/2020   Fall 09/05/2020   Nausea 04/23/2020   Constipation 04/23/2020   Posterior uveitis, 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   Dysphagia 01/21/2020   CHF (congestive heart failure) (HCC) 11/23/2019   Exposure to COVID-19 virus 12/04/2018   Osteoarthritis 09/19/2018   Abscess of left leg 09/26/2017   Left leg cellulitis 09/18/2017  Whiplash 06/10/2017   Cough 05/29/2017   Concussion 05/29/2017   Hammer toes of both feet 12/19/2016   Allergic rhinitis 12/09/2016   Pedal edema 09/17/2016   Chest pain 07/18/2016   Gross hematuria 04/26/2016   Prediabetes 03/21/2016   Dysuria 12/07/2015   Fever blister 12/07/2015   Choroidal nevus, 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   Malignant neoplasm of left female breast (HCC) 10/10/2015   Anxiety and depression 06/30/2015   Encounter for well adult exam with abnormal findings 06/30/2015   Neck mass 03/24/2015   Hypothyroidism 03/24/2015   Gout 03/24/2015   Essential hypertension 03/24/2015   Asthma 03/24/2015   Peripheral neuropathy 03/24/2015   Osteoporosis 03/24/2015   Morbid obesity (HCC) 03/24/2015   Long term  current use of anticoagulant therapy 03/16/2015   Lymphadenopathy, cervical 03/16/2015   Diarrhea 03/16/2015   Hyperbilirubinemia 03/16/2015   Coronary arteriosclerosis 09/22/2014   Hyperlipidemia 07/23/2013   Hypokalemia 02/18/2013   Elevated bilirubin 02/18/2013   Morbid obesity with BMI of 45.0-49.9, adult (HCC) 10/29/2012   Rotator cuff tear arthropathy 10/29/2012   DJD (degenerative joint disease) of knee 10/29/2012   Plantar fasciitis    Arthritis    Hx of radiation therapy    Atrial fibrillation, chronic (HCC) 01/21/2011   Malignant neoplasm of upper-outer quadrant of left breast in female, estrogen receptor positive (HCC) 12/26/2010   Breast cancer (IL[facility]) Receptor + her2 _ 12/26/2010    PCP: Mercedes Levins, MD REFERRING PROVIDER: Rodolph Bong, MD  REFERRING DIAG:  S06.0X0D (ICD-10-CM) - Concussion without loss of consciousness, subsequent encounter    THERAPY DIAG:  Muscle weakness (generalized)  Unsteadiness on feet  Neck pain  Difficulty in walking, not elsewhere classified  ONSET DATE: 10/15/22  Rationale for Evaluation and Treatment: Rehabilitation  SUBJECTIVE:   SUBJECTIVE STATEMENT: Feeling much better, overall greatly improved.  HA and neck pain have been minimal  Pt accompanied by: self  PERTINENT HISTORY: [name],  is a 87 y.o. female who presents for initial evaluation of a head injury and multiple contusions. MOI: She fell/rolled out of bed due to her nightgown being tangled in the bedding. She was transported via EMS to the [facility] ED and dx w/ a levator scapula hematoma. Today, pt c/o cont'd confusion, soreness in her neck, shoulders, and thoracic spine. Her daughter notes a "bump" on the back of pt's head.   PAIN:  Are you having pain? Yes: NPRS scale: 1-2/10 Pain location: "the whole neck"  Pain description: stiff Aggravating factors: mainly at night  Relieving factors: extra strength tylenol  PRECAUTIONS: None  WEIGHT BEARING  RESTRICTIONS: No  FALLS: Has patient fallen in last 6 months? Yes. Number of falls 1  LIVING ENVIRONMENT: Lives with: lives alone Lives in: House/apartment Stairs: Yes: Internal: 2-3 steps; can reach both and typically uses rollator , ramp for entering/exiting home Has following equipment at home: Dan Humphreys - 4 wheeled  PLOF: Independent with household mobility with device and Independent with community mobility with device  PATIENT GOALS: reduce pain  OBJECTIVE:   TODAY'S TREATMENT: 01/29/23 Activity Comments  Retrowalking x 6 laps counter   Sidestepping x 2 min   Standing on foam EO/EC 3x30 sec Head turns EO/EC 4x3 reps  Seated unilat row 1x10 -15#, 20#, 25#  Seated bar row 3x10 -25#  Seated lat row 3x10 -40#        TODAY'S TREATMENT: 01/22/23 Activity Comments  Standing balance on firm/foam -EO/EC x 30 sec -head/trunk turns 3x3 EO/EC  Seated  rows 1x20 10# 1x10 25# 1x10 30#  Lat row  1x10 30# 1x10 35#  Standing on stool placing items to overhead cabinets 1x10 -overhead and upward gaze reaching  Soft tissue mobilization Trigger point release and soft tissue mobilization bilateral cervical column and scapula to address pain/trigger points LUE/RUE           HOME EXERCISE PROGRAM Last updated: 12/12/22 Access Code: [email or web address] URL: [email or web address] Date: 12/12/2022 Prepared by: [facility] - Outpatient  Rehab - [facility]  Exercises - Seated Cervical Sidebending Stretch  - 1 x daily - 7 x weekly - 3 sets - 30-60 sec hold - Seated Upper Trap Stretch  - 1 x daily - 7 x weekly - 3 sets - 30-60 sec hold - Cervical Retraction with Overpressure  - 1 x daily - 5 x weekly - 2 sets - 10 reps - 3 sec hold - Seated Assisted Cervical Rotation with Towel  - 1 x daily - 5 x weekly - 2 sets - 10 reps - Mid-Lower Cervical Extension SNAG with Strap  - 1 x daily - 5 x weekly - 2 sets - 10 reps - Romberg Stance with Eyes Closed  - 1 x daily - 5 x weekly - 2 sets - 30  sec hold - Romberg Stance with Head Nods  - 1 x daily - 5 x weekly - 2 sets - 30 sec hold - Supine Cervical Retraction with Towel  - 1 x daily - 7 x weekly - 2 sets - 5 reps - 3 sec hold - Standing Toe Taps  - 1 x daily - 5 x weekly - 2 sets - 10 reps - Corner Balance Feet Together With Eyes Open  - 1 x daily - 5 x weekly - 2 sets - 30 sec hold - Corner Balance Feet Together With Eyes Closed  - 1 x daily - 5 x weekly - 2 sets - 30 sec hold - Shoulder External Rotation and Scapular Retraction with Resistance  - 1 x daily - 7 x weekly - 1-3 sets - 10 reps - Seated Shoulder Horizontal Abduction with Resistance  - 1 x daily - 7 x weekly - 3 sets - 10 reps - Pencil Pushups  - 1 x daily - 7 x weekly - 1-3 sets - 10 reps     Below measures were taken at time of initial evaluation unless otherwise specified:   DIAGNOSTIC FINDINGS:  IMPRESSION: No acute bony abnormality. Degenerative disc and facet disease throughout the cervical spine.   Enlargement of the left levator scapulae muscle with surrounding stranding most compatible with intramuscular hematoma.  COGNITION: Overall cognitive status: Within functional limits for tasks assessed   SENSATION: WNL  EDEMA:    MUSCLE TONE:  WNL   POSTURE:  No Significant postural limitations  Cervical ROM:    Active A/PROM (deg) eval  Flexion 50  Extension 40  Right lateral flexion 15  Left lateral flexion 15  Right rotation 15  Left rotation 20  (Blank rows = not tested)  UE ROM: WFL--able to elevate arms overhead full ROM but with discomfort in LUE  STRENGTH: 5/5 gross BUE  PALPATION:  multiple areas of tenderness/trigger points throughout left shoulder girdle and neck--more soft tissue than joint discomfort of the area  BED MOBILITY:  independent  TRANSFERS: Assistive device utilized: Environmental consultant - 4 wheeled  Sit to stand: Modified independence Stand to sit: Modified independence Chair to chair: Modified independence Floor:   NT  RAMP:  Modified independence  CURB: Modified independence  GAIT: Gait pattern: WFL Distance walked:  Assistive device utilized: Environmental consultant - 4 wheeled Level of assistance: Modified independence Comments:   FUNCTIONAL TESTS:  Timed up and go (TUG): 23 sec w/ rollator Dynamic Gait Index: 13/24  M-CTSIB  Condition 1: Firm Surface, EO 30 Sec, Normal and Mild Sway  Condition 2: Firm Surface, EC 10 Sec, Moderate and Severe Sway  Condition 3: Foam Surface, EO 30 Sec, Normal Sway  Condition 4: Foam Surface, EC 4 Sec, Severe Sway        GOALS: Goals reviewed with patient? Yes  SHORT TERM GOALS: Target date: 11/15/2022    Patient will be independent in HEP to improve functional outcomes Baseline: Goal status: MET    LONG TERM GOALS: Target date: 02/12/2023      Exhibit improved C-spine ROM to 25 degrees lateral flexion and 30+ degrees rotation to improve comfort and safety when driving Baseline:  Flexion: 45 deg Extension: 30-40 Right lateral flexion: 20-25  Left lateral: 20 Right rotation: 20 deg Left rotation: 25 deg Goal status: IN PROGRESS  2.  Report left shoulder pain not exceeding 3/10 with overhead movements to improve comfort with household activity and self-care tasks Baseline: 2-3/10 Goal status: MET  3.  Demo reduced risk for falls per time 15 sec TUG test Baseline: 23 sec w/ rollator; (12/03/22) 21 sec w/ rollator Goal status: IN PROGRESS  4.  Improved postural stability per mild sway conditions 2 and 4 M-CTSIB x 15 sec to improve safety with ADL Baseline: modertate x 20 sec Goal status: IN PROGRESS  5.  Demo reduced risk for falls per score 19/24 Dynamic Gait Index  Baseline: 15/24  Goal status: INITIAL  ASSESSMENT:  CLINICAL IMPRESSION: Pt returns to clinic and reports global improvement without HA and chief c/o neck stiffness but not same degree of pain that has been present. Continued with multi-sensory balance activities to improve postural  stability and overall balance for safety during ADL.  Difficulty under eyes closed conditions requiring light UE support.  Continued with compound resistance training for upper body to improve strength and efficiency.  Continued sessions to progress static and dynamic balance to reduce risk for falls   OBJECTIVE IMPAIRMENTS: decreased activity tolerance, decreased balance, decreased ROM, increased muscle spasms, impaired flexibility, and pain.   ACTIVITY LIMITATIONS: carrying, bending, sleeping, dressing, and reach over head  PARTICIPATION LIMITATIONS: meal prep, cleaning, laundry, driving, shopping, and community activity  PERSONAL FACTORS: Time since onset of injury/illness/exacerbation and 1-2 comorbidities: knee OA  are also affecting patient's functional outcome.   REHAB POTENTIAL: Excellent  CLINICAL DECISION MAKING: Stable/uncomplicated  EVALUATION COMPLEXITY: Low   PLAN:  PT FREQUENCY: 1-2x/week, plan is for 1x/wk if neck pain does not continue to limit  PT DURATION: 6 weeks  PLANNED INTERVENTIONS: Therapeutic exercises, Therapeutic activity, Neuromuscular re-education, Balance training, Gait training, Patient/Family education, Self Care, Joint mobilization, Stair training, Vestibular training, Canalith repositioning, DME instructions, Aquatic Therapy, Dry Needling, Electrical stimulation, Spinal mobilization, Cryotherapy, Moist heat, Traction, Ultrasound, Ionotophoresis 4mg /ml Dexamethasone, and Manual therapy  PLAN FOR NEXT SESSION: balance activities, STM as indicated   2:52 PM, 01/29/23 M. Shary Decamp, PT, DPT Physical Therapist- [place] Office Number: [phone]

## 2023-01-31 ENCOUNTER — Other Ambulatory Visit: Payer: Medicare Other

## 2023-02-03 ENCOUNTER — Telehealth: Payer: Self-pay | Admitting: Family Medicine

## 2023-02-03 DIAGNOSIS — S12100A Unspecified displaced fracture of second cervical vertebra, initial encounter for closed fracture: Secondary | ICD-10-CM

## 2023-02-03 NOTE — Telephone Encounter (Signed)
Called and left a message on Daughter's phone re her MRI results offering a time to discuss the results.  Ok to call back.

## 2023-02-03 NOTE — Progress Notes (Signed)
MRI cervical spine shows a subacute nondisplaced fracture at the very top of your cervical spine.  This was not seen on the CT scan in June I have referred you to neurosurgery.  They should contact you today.  I would recommend to get an appointment as soon as possible.  Recommend stopping physical therapy for now.

## 2023-02-03 NOTE — Progress Notes (Signed)
Brain MRI looks okay to radiology.

## 2023-02-03 NOTE — Telephone Encounter (Signed)
Patient called and would like to have this refilled.  If not please call patient and let her know why she is unable to get this.  Phone:  [phone]

## 2023-02-03 NOTE — Telephone Encounter (Signed)
Referred to neurosurgery.

## 2023-02-04 ENCOUNTER — Other Ambulatory Visit: Payer: Self-pay | Admitting: Pharmacist

## 2023-02-04 DIAGNOSIS — E782 Mixed hyperlipidemia: Secondary | ICD-10-CM

## 2023-02-04 DIAGNOSIS — I2510 Atherosclerotic heart disease of native coronary artery without angina pectoris: Secondary | ICD-10-CM

## 2023-02-04 MED ORDER — REPATHA PUSHTRONEX SYSTEM 420 MG/3.5ML ~~LOC~~ SOCT: 420.0000 mg | SUBCUTANEOUS | 0 refills | Status: DC

## 2023-02-05 ENCOUNTER — Ambulatory Visit: Payer: Medicare Other | Attending: Family Medicine

## 2023-02-05 DIAGNOSIS — R42 Dizziness and giddiness: Secondary | ICD-10-CM | POA: Insufficient documentation

## 2023-02-05 DIAGNOSIS — T63461A Toxic effect of venom of wasps, accidental (unintentional), initial encounter: Secondary | ICD-10-CM | POA: Diagnosis not present

## 2023-02-05 DIAGNOSIS — R2681 Unsteadiness on feet: Secondary | ICD-10-CM | POA: Insufficient documentation

## 2023-02-05 DIAGNOSIS — R262 Difficulty in walking, not elsewhere classified: Secondary | ICD-10-CM | POA: Diagnosis not present

## 2023-02-05 DIAGNOSIS — M542 Cervicalgia: Secondary | ICD-10-CM | POA: Insufficient documentation

## 2023-02-05 DIAGNOSIS — J301 Allergic rhinitis due to pollen: Secondary | ICD-10-CM | POA: Diagnosis not present

## 2023-02-05 DIAGNOSIS — J3089 Other allergic rhinitis: Secondary | ICD-10-CM | POA: Diagnosis not present

## 2023-02-05 DIAGNOSIS — J4540 Moderate persistent asthma, uncomplicated: Secondary | ICD-10-CM | POA: Diagnosis not present

## 2023-02-05 DIAGNOSIS — M6281 Muscle weakness (generalized): Secondary | ICD-10-CM | POA: Diagnosis not present

## 2023-02-05 NOTE — Therapy (Signed)
OUTPATIENT PHYSICAL THERAPY VESTIBULAR TREATMENT     Patient Name: [name] MRN: [id number] DOB:05-Dec-1935, 87 y.o., female Today's Date: 02/05/2023    END OF SESSION:  PT End of Session - 02/05/23 1448     Visit Number 19    Number of Visits 26    Date for PT Re-Evaluation 02/11/23    Authorization Type [employer]    Progress Note Due on Visit 24    PT Start Time 1445    PT Stop Time 1530    PT Time Calculation (min) 45 min    Equipment Utilized During Treatment Gait belt    Activity Tolerance Patient tolerated treatment well    Behavior During Therapy WFL for tasks assessed/performed                    Past Medical History:  Diagnosis Date   Allergy    Anxiety    Arthritis    no cartlidge in knees   Asthma    Bradycardia    a. atenolol stopped due to this, HR 40s.   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  CAD s/p RCA stent 2000 with residual mild-mod disease of left system 2001.   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   Osteoporosis 03/24/2015   Peripheral neuropathy 03/24/2015   Plantar fasciitis    Sleep apnea    Past Surgical History:  Procedure Laterality Date   BREAST LUMPECTOMY W/ NEEDLE LOCALIZATION  01/29/2011   Left with [facility] Dr Jamey Ripa   CHOLECYSTECTOMY  1955   CORONARY ANGIOPLASTY WITH STENT PLACEMENT  2000   DILATION AND CURETTAGE OF UTERUS  1976   and 1996   HAND SURGERY  1992   tendons between thumb and forefinger   SKIN BIOPSY     1994, 2006, 2008, 2010, 2011, 2011, 2012-  pre-cancerous   TONSILLECTOMY  1955   Patient Active Problem List    Diagnosis Date Noted   Acute renal failure superimposed on chronic kidney disease (HCC) 12/16/2022   Cloudy urine 11/25/2022   Insomnia 07/27/2022   Vitamin D deficiency 07/27/2022   Pulmonary hypertension (HCC) 11/07/2021   Bronchiectasis without complication (HCC) 11/07/2021   Hepatic cirrhosis (HCC) 11/06/2021   ILD (interstitial lung disease) (HCC) 10/03/2021   Abnormal LFTs 09/28/2021   Weight loss 08/19/2021   Chronic respiratory failure with hypoxia (HCC) 08/19/2021   Debility 06/29/2021   Multifocal pneumonia 06/15/2021   Elevated troponin 06/12/2021   Febrile illness 05/22/2021   Anxiety state 10/23/2020   Pure hypercholesterolemia 10/23/2020   Urinary tract infectious disease 10/23/2020   Atherosclerosis of coronary artery 10/23/2020   Sinus node dysfunction (HCC) 10/23/2020   Mixed hyperlipidemia 10/23/2020   OSA on CPAP 10/23/2020   Fall 09/05/2020   Nausea 04/23/2020   Constipation 04/23/2020   Posterior uveitis, 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   Dysphagia 01/21/2020   CHF (congestive heart failure) (HCC) 11/23/2019   Exposure to COVID-19 virus 12/04/2018   Osteoarthritis 09/19/2018   Abscess of left leg 09/26/2017   Left leg cellulitis 09/18/2017  Whiplash 06/10/2017   Cough 05/29/2017   Concussion 05/29/2017   Hammer toes of both feet 12/19/2016   Allergic rhinitis 12/09/2016   Pedal edema 09/17/2016   Chest pain 07/18/2016   Gross hematuria 04/26/2016   Prediabetes 03/21/2016   Dysuria 12/07/2015   Fever blister 12/07/2015   Choroidal nevus, 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   Malignant neoplasm of left female breast (HCC) 10/10/2015   Anxiety and depression 06/30/2015   Encounter for well adult exam with abnormal findings 06/30/2015   Neck mass 03/24/2015   Hypothyroidism 03/24/2015   Gout 03/24/2015   Essential hypertension 03/24/2015   Asthma 03/24/2015   Peripheral neuropathy 03/24/2015   Osteoporosis 03/24/2015   Morbid obesity (HCC) 03/24/2015   Long term  current use of anticoagulant therapy 03/16/2015   Lymphadenopathy, cervical 03/16/2015   Diarrhea 03/16/2015   Hyperbilirubinemia 03/16/2015   Coronary arteriosclerosis 09/22/2014   Hyperlipidemia 07/23/2013   Hypokalemia 02/18/2013   Elevated bilirubin 02/18/2013   Morbid obesity with BMI of 45.0-49.9, adult (HCC) 10/29/2012   Rotator cuff tear arthropathy 10/29/2012   DJD (degenerative joint disease) of knee 10/29/2012   Plantar fasciitis    Arthritis    Hx of radiation therapy    Atrial fibrillation, chronic (HCC) 01/21/2011   Malignant neoplasm of upper-outer quadrant of left breast in female, estrogen receptor positive (HCC) 12/26/2010   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  Call placed to neuro-surgery for consult  Pt accompanied by: self  PERTINENT HISTORY: [name],  is a 87 y.o. female who presents for initial evaluation of a head injury and multiple contusions. MOI: She fell/rolled out of bed due to her nightgown being tangled in the bedding. She was transported via EMS to the [facility] ED and dx w/ a levator scapula hematoma. Today, pt c/o cont'd confusion, soreness in her neck, shoulders, and thoracic spine. Her daughter notes a "bump" on the back of pt's head.   PAIN:  Are you having pain? Yes: NPRS scale: 1-2/10 Pain location: "the whole neck"  Pain description: stiff Aggravating factors: mainly at night  Relieving factors: extra strength  tylenol  PRECAUTIONS: None  WEIGHT BEARING RESTRICTIONS: No  FALLS: Has patient fallen in last 6 months? Yes. Number of falls 1  LIVING ENVIRONMENT: Lives with: lives alone Lives in: House/apartment Stairs: Yes: Internal: 2-3 steps; can reach both and typically uses rollator , ramp for entering/exiting home Has following equipment at home: Dan Humphreys - 4 wheeled  PLOF: Independent with household mobility with device and Independent with community mobility with device  PATIENT GOALS: reduce pain  OBJECTIVE:   TODAY'S TREATMENT: 02/05/23 Activity Comments  Discussion of c-spine anatomy for orientation and role of dens   Multi-sensory balance Limited head movements  Sidestepping x 2 min   Retro-walking x 2 min                   HOME EXERCISE PROGRAM Last updated: 12/12/22 Access Code: [email or web address] URL: [email or web address] Date: 12/12/2022 Prepared by: [name] - Outpatient  Rehab - [facility]  Exercises - Seated Cervical Sidebending Stretch  - 1 x daily - 7 x weekly - 3 sets - 30-60 sec hold - Seated Upper Trap Stretch  - 1 x daily - 7 x weekly - 3 sets - 30-60 sec hold - Cervical Retraction with Overpressure  - 1 x daily - 5 x weekly - 2 sets - 10 reps - 3 sec hold - Seated Assisted Cervical Rotation with Towel  - 1 x daily - 5 x weekly - 2 sets - 10 reps - Mid-Lower Cervical Extension SNAG with Strap  - 1 x daily - 5 x weekly - 2 sets - 10 reps - Romberg Stance with Eyes Closed  - 1 x daily - 5 x weekly - 2 sets - 30 sec hold - Romberg Stance with Head Nods  - 1 x daily - 5 x weekly - 2 sets - 30 sec hold - Supine Cervical Retraction with Towel  - 1 x daily - 7 x weekly - 2 sets - 5 reps - 3 sec hold - Standing Toe Taps  - 1 x daily - 5 x weekly - 2 sets - 10 reps - Corner Balance Feet Together With Eyes Open  - 1 x daily - 5 x weekly - 2 sets - 30 sec hold - Corner Balance Feet Together With Eyes Closed  - 1 x daily - 5 x weekly - 2 sets - 30 sec  hold - Shoulder External Rotation and Scapular Retraction with Resistance  - 1 x daily - 7 x weekly - 1-3 sets - 10 reps - Seated Shoulder Horizontal Abduction with Resistance  - 1 x daily - 7 x weekly - 3 sets - 10 reps - Pencil Pushups  - 1 x daily - 7 x weekly - 1-3 sets - 10 reps     Below measures were taken at time of initial evaluation unless otherwise specified:   DIAGNOSTIC FINDINGS:  IMPRESSION: No acute bony abnormality. Degenerative disc and facet disease throughout the cervical spine.   Enlargement of the left levator scapulae muscle with surrounding stranding most compatible with intramuscular hematoma.  COGNITION: Overall cognitive status: Within functional limits for tasks assessed   SENSATION: WNL  EDEMA:    MUSCLE TONE:  WNL   POSTURE:  No Significant postural limitations  Cervical ROM:    Active A/PROM (deg) eval  Flexion 50  Extension 40  Right lateral flexion 15  Left lateral flexion 15  Right rotation 15  Left rotation 20  (Blank rows = not tested)  UE ROM: WFL--able to elevate arms overhead full ROM but with discomfort in LUE  STRENGTH: 5/5 gross BUE  PALPATION:  multiple areas of tenderness/trigger points throughout left shoulder girdle and neck--more soft tissue than joint discomfort of the area  BED MOBILITY:  independent  TRANSFERS: Assistive device utilized: Environmental consultant - 4 wheeled  Sit to stand: Modified independence Stand to sit: Modified independence Chair to chair: Modified independence Floor:  NT  RAMP: Modified independence  CURB: Modified independence  GAIT: Gait pattern: WFL Distance walked:  Assistive device utilized: Environmental consultant - 4 wheeled Level of assistance: Modified independence Comments:   FUNCTIONAL TESTS:  Timed up and go (TUG): 23 sec w/ rollator Dynamic Gait Index: 13/24  M-CTSIB  Condition 1: Firm Surface, EO 30 Sec, Normal and Mild Sway  Condition 2: Firm Surface, EC 10 Sec, Moderate and Severe Sway   Condition 3: Foam Surface, EO 30 Sec, Normal Sway  Condition 4:  Foam Surface, EC 4 Sec, Severe Sway        GOALS: Goals reviewed with patient? Yes  SHORT TERM GOALS: Target date: 11/15/2022    Patient will be independent in HEP to improve functional outcomes Baseline: Goal status: MET    LONG TERM GOALS: Target date: 02/12/2023      Exhibit improved C-spine ROM to 25 degrees lateral flexion and 30+ degrees rotation to improve comfort and safety when driving Baseline:  Flexion: 45 deg Extension: 30-40 Right lateral flexion: 20-25  Left lateral: 20 Right rotation: 20 deg Left rotation: 25 deg Goal status: IN PROGRESS  2.  Report left shoulder pain not exceeding 3/10 with overhead movements to improve comfort with household activity and self-care tasks Baseline: 2-3/10 Goal status: MET  3.  Demo reduced risk for falls per time 15 sec TUG test Baseline: 23 sec w/ rollator; (12/03/22) 21 sec w/ rollator Goal status: IN PROGRESS  4.  Improved postural stability per mild sway conditions 2 and 4 M-CTSIB x 15 sec to improve safety with ADL Baseline: modertate x 20 sec Goal status: IN PROGRESS  5.  Demo reduced risk for falls per score 19/24 Dynamic Gait Index  Baseline: 15/24  Goal status: INITIAL  ASSESSMENT:  CLINICAL IMPRESSION: Pt returns to clinic and reports MRI results confirm acute/subacute fracture of dens and has f/u to schedule with neurosurgery.  Activities amended today for focus on multi-sensory static and dynamic balance to improve stability and reduce risk for falls.  Limited activities to avoid active head/neck rotation and end-range movements due to revelation of dens fracture and avoided all lifting activities as well out of precaution.  Demonstrates difficulty with eyes closed compliant surfaces demands and difficulty under single limb support.  Will continue activities as tolerated and as permissible by MD recommendations.   OBJECTIVE IMPAIRMENTS:  decreased activity tolerance, decreased balance, decreased ROM, increased muscle spasms, impaired flexibility, and pain.   ACTIVITY LIMITATIONS: carrying, bending, sleeping, dressing, and reach over head  PARTICIPATION LIMITATIONS: meal prep, cleaning, laundry, driving, shopping, and community activity  PERSONAL FACTORS: Time since onset of injury/illness/exacerbation and 1-2 comorbidities: knee OA  are also affecting patient's functional outcome.   REHAB POTENTIAL: Excellent  CLINICAL DECISION MAKING: Stable/uncomplicated  EVALUATION COMPLEXITY: Low   PLAN:  PT FREQUENCY: 1-2x/week, plan is for 1x/wk if neck pain does not continue to limit  PT DURATION: 6 weeks  PLANNED INTERVENTIONS: Therapeutic exercises, Therapeutic activity, Neuromuscular re-education, Balance training, Gait training, Patient/Family education, Self Care, Joint mobilization, Stair training, Vestibular training, Canalith repositioning, DME instructions, Aquatic Therapy, Dry Needling, Electrical stimulation, Spinal mobilization, Cryotherapy, Moist heat, Traction, Ultrasound, Ionotophoresis 4mg /ml Dexamethasone, and Manual therapy  PLAN FOR NEXT SESSION: balance activities, STM as indicated   2:48 PM, 02/05/23 M. Shary Decamp, PT, DPT Physical Therapist- [place] Office Number: [phone]

## 2023-02-11 ENCOUNTER — Ambulatory Visit (INDEPENDENT_AMBULATORY_CARE_PROVIDER_SITE_OTHER): Payer: Medicare Other

## 2023-02-11 ENCOUNTER — Ambulatory Visit: Payer: Medicare Other | Admitting: Gastroenterology

## 2023-02-11 ENCOUNTER — Encounter: Payer: Self-pay | Admitting: Gastroenterology

## 2023-02-11 VITALS — BP 130/78 | HR 67 | Ht 64.0 in | Wt 221.0 lb

## 2023-02-11 DIAGNOSIS — K7581 Nonalcoholic steatohepatitis (NASH): Secondary | ICD-10-CM | POA: Diagnosis not present

## 2023-02-11 DIAGNOSIS — K219 Gastro-esophageal reflux disease without esophagitis: Secondary | ICD-10-CM

## 2023-02-11 DIAGNOSIS — G4733 Obstructive sleep apnea (adult) (pediatric): Secondary | ICD-10-CM | POA: Diagnosis not present

## 2023-02-11 DIAGNOSIS — K7460 Unspecified cirrhosis of liver: Secondary | ICD-10-CM

## 2023-02-11 DIAGNOSIS — S12121A Other nondisplaced dens fracture, initial encounter for closed fracture: Secondary | ICD-10-CM | POA: Diagnosis not present

## 2023-02-11 LAB — PROTIME-INR
INR: 1.8 {ratio} — ABNORMAL HIGH (ref 0.8–1.0)
Prothrombin Time: 18.9 s — ABNORMAL HIGH (ref 9.6–13.1)

## 2023-02-11 LAB — CBC WITH DIFFERENTIAL/PLATELET
Basophils Absolute: 0.1 10*3/uL (ref 0.0–0.1)
Basophils Relative: 0.9 % (ref 0.0–3.0)
Eosinophils Absolute: 0.2 10*3/uL (ref 0.0–0.7)
Eosinophils Relative: 2.4 % (ref 0.0–5.0)
HCT: 42.4 % (ref 36.0–46.0)
Hemoglobin: 13.7 g/dL (ref 12.0–15.0)
Lymphocytes Relative: 19.4 % (ref 12.0–46.0)
Lymphs Abs: 1.4 10*3/uL (ref 0.7–4.0)
MCHC: 32.3 g/dL (ref 30.0–36.0)
MCV: 99.8 fL (ref 78.0–100.0)
Monocytes Absolute: 0.6 10*3/uL (ref 0.1–1.0)
Monocytes Relative: 9 % (ref 3.0–12.0)
Neutro Abs: 4.9 10*3/uL (ref 1.4–7.7)
Neutrophils Relative %: 68.3 % (ref 43.0–77.0)
Platelets: 130 10*3/uL — ABNORMAL LOW (ref 150.0–400.0)
RBC: 4.25 Mil/uL (ref 3.87–5.11)
RDW: 14.4 % (ref 11.5–15.5)
WBC: 7.2 10*3/uL (ref 4.0–10.5)

## 2023-02-11 MED ORDER — PANTOPRAZOLE SODIUM 40 MG PO TBEC: 40.0000 mg | DELAYED_RELEASE_TABLET | Freq: Every day | ORAL | 4 refills | Status: DC

## 2023-02-11 MED ORDER — MIRTAZAPINE 15 MG PO TABS: 15.0000 mg | ORAL_TABLET | Freq: Every day | ORAL | 3 refills | Status: DC

## 2023-02-11 NOTE — Progress Notes (Signed)
Chief Complaint: FU  Referring Provider:  [name], MD      ASSESSMENT AND PLAN;   #1. 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 She does have pulm HTN without overt RHF. No hepatic encephalopathy, EV or ascites (on imaging).  #2. GERD  #3. A Fib on Xeralto  Plan: -CBC, CMP, PT INR. -protonix 40mg  po QD #90, 4RF -Resume Remeron 15mg  po every day #90 3RF (for decreased appeite) -Low salt diet. -Avoid hepatotoxic meds. -FU in 1 year.   HPI:    [name] is a 87 y.o. female  With H/O asthma, COPD on home O2 (nightly),  OSA off CPAP now (followed by Dr. Allie Dimmer), CAD, CHF (Nl EF 60-65%), hypertension, sinus node dysfunction, A-fib on Xarelto, hypothyroid, peripheral neuropathy, osteoarthritis, stage Ia left breast cancer 2012, HLD, obesity, gout, prediabetes.  FU NASH cirrhosis. Had sudden increase in LFTs, better since all of the supplements have been stopped. Now normal.  Had fall, MRI 9/13 showed upper cervical vertebral fracture. Has appt with neurosurgery today. MRI head neg.  Feels good otherwise.  She has decreased appetite.  Has easy bruisability and multiple bruises d/t Xarelto.  No change in mental status.  No nausea, vomiting, heartburn, regurgitation, odynophagia or dysphagia.  No significant diarrhea or constipation.  No melena or hematochezia. No abdominal pain.  No further weight loss.   She is compliant with low sodium diet.  No nausea, vomiting, heartburn, regurgitation, odynophagia or dysphagia (had in past, ba swallow showed esophageal dysmotility).  No significant diarrhea.  No melena or hematochezia. No unintentional weight loss. No abdominal pain.  MELD 3.0: 12 at 10/15/2022 10:34 PM MELD-Na: 12 at 10/15/2022 10:34 PM Calculated from: Serum Creatinine: 0.90 mg/dL (Using min of 1 mg/dL) at 1/61/0960 45:40 PM Serum Sodium: 144 mmol/L (Using max of 137 mmol/L) at 10/15/2022 10:34 PM Total Bilirubin: 1.3 mg/dL at 9/81/1914 78:29  PM Serum Albumin: 3.5 g/dL at 5/62/1308 65:78 PM INR(ratio): 1.5 at 10/15/2022 10:12 PM Age at listing (hypothetical): 87 years Sex: Female at 10/15/2022 10:34 PM       Latest Ref Rng & Units 12/05/2022   12:16 PM 11/25/2022   10:18 AM 10/15/2022   10:12 PM  Hepatic Function  Total Protein 6.0 - 8.3 g/dL 6.7  6.4  5.9   Albumin 3.5 - 5.2 g/dL 4.1  3.9  3.5   AST 0 - 37 U/L 20  21  23    ALT 0 - 35 U/L 13  14  16    Alk Phosphatase 39 - 117 U/L 76  74  60   Total Bilirubin 0.2 - 1.2 mg/dL 1.3  1.3  1.3   Bilirubin, Direct 0.0 - 0.3 mg/dL  0.2       Wt Readings from Last 3 Encounters:  02/11/23 221 lb (100.2 kg)  12/31/22 230 lb (104.3 kg)  12/16/22 222 lb (100.7 kg)      Previous GI work-up:  MRCP 12/05/2021 1. Hepatic morphologic changes as can be seen with given diagnosis of cirrhosis. No suspicious hepatic lesion. 2. Prominent periportal and portacaval lymph nodes are favored reactive in the setting of cirrhosis. 3. Small hiatal hernia.  [place] with Doppler: Cirrhosis with normal hepatic venous Doppler.  Never had EGD/colonoscopy.  Does not want one  CT AP 11/05/2021 1. Cirrhosis with no portal hypertension. No focal liver lesions identified . Please note that liver protocol enhanced MR and CT are the most sensitive tests for the screening  detection of hepatocellular carcinoma in the high risk setting of cirrhosis. 2.  Aortic Atherosclerosis (ICD10-I70.0). 3.  Small hiatal hernia.  Ba swallow 06/29/2021 IMPRESSION: 1. Limited mobility exam. 2. No esophageal stricture or mass.  No obstruction. 3. Mild esophageal dysmotility.  2DE 06/2021: EF 60 to 65%.  LVH.  Unable to determine diastolic function.  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  [name] is the other daughter.  Past Medical History:  Diagnosis Date   Allergy    Anxiety    Arthritis    no  cartlidge in knees   Asthma    Bradycardia    a. atenolol stopped due to this, HR 40s.   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  CAD s/p RCA stent 2000 with residual mild-mod disease of left system 2001.   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   Osteoporosis 03/24/2015   Peripheral neuropathy 03/24/2015   Plantar fasciitis    Sleep apnea     Past Surgical History:  Procedure Laterality Date   BREAST LUMPECTOMY W/ NEEDLE LOCALIZATION  01/29/2011   Left with [facility] Dr Jamey Ripa   CHOLECYSTECTOMY  1955   CORONARY ANGIOPLASTY WITH STENT PLACEMENT  2000   DILATION AND CURETTAGE OF UTERUS  1976   and 1996   HAND SURGERY  1992   tendons between thumb and forefinger   SKIN BIOPSY     1994, 2006, 2008, 2010, 2011, 2011, 2012-  pre-cancerous   TONSILLECTOMY  1955    Family History  Problem Relation Age of Onset   Heart disease Father    Heart attack Father    Rectal cancer Father    Asthma Sister    Heart disease Brother    Heart attack Brother    Stomach cancer Neg Hx    Colon cancer Neg Hx     Social History   Tobacco Use   Smoking status: Former    Current packs/day: 0.00    Average packs/day: 1 pack/day for 4.0 years (4.0 ttl pk-yrs)    Types: Cigarettes    Start date: 12/05/1958    Quit date: 12/05/1962    Years since quitting: 60.2   Smokeless tobacco: Never  Vaping Use   Vaping status: Never Used  Substance Use Topics   Alcohol use: No   Drug use: No    Current Outpatient Medications  Medication Sig Dispense Refill   albuterol (PROVENTIL) (2.5 MG/3ML) 0.083% nebulizer solution Take 3 mLs (2.5 mg  total) by nebulization every 6 (six) hours as needed for wheezing or shortness of breath. 75 mL 12   allopurinol (ZYLOPRIM) 300 MG tablet Take 300 mg by mouth daily.     ALPRAZolam (XANAX) 0.5 MG tablet TAKE 1 TO 2 TABLETS BY MOUTH TWICE DAILY AS NEEDED FOR ANXIETY OR SLEEP 60 tablet 2   AMBULATORY NON FORMULARY MEDICATION Medication Name: Prednisone Injection in knee     azelastine (ASTELIN) 0.1 %  nasal spray Place 2 sprays into both nostrils daily. Use in each nostril as directed (Patient taking differently: Place 2 sprays into both nostrils in the morning.) 30 mL 5   bisacodyl (DULCOLAX) 10 MG suppository Place 1 suppository (10 mg total) rectally as needed for moderate constipation. 12 suppository 0   budesonide-formoterol (SYMBICORT) 80-4.5 MCG/ACT inhaler 2 puffs Inhalation Twice a day     co-enzyme Q-10 50 MG capsule Take 100 mg by mouth daily.     colchicine 0.6 MG tablet Take 0.6 mg by mouth daily as needed (flare  up).     cycloSPORINE (RESTASIS) 0.05 % ophthalmic emulsion Apply 1 drop to eye 2 (two) times daily as needed (allergies).     EPINEPHrine 0.3 mg/0.3 mL IJ SOAJ injection Inject 0.3 mg into the muscle as needed for anaphylaxis.     Evolocumab with Infusor (REPATHA PUSHTRONEX SYSTEM) 420 MG/3.5ML SOCT Inject 420 mg into the skin every 30 (thirty) days. 3.5 mL 0   ezetimibe (ZETIA) 10 MG tablet Take 1 tablet (10 mg total) by mouth daily. Take on Tues & Fri 90 tablet 3   fexofenadine (ALLERGY RELIEF) 180 MG tablet Take 1 tablet (180 mg total) by mouth daily. 90 tablet 5   fluticasone (FLONASE) 50 MCG/ACT nasal spray Place 1 spray into both nostrils every morning.     fluticasone-salmeterol (ADVAIR DISKUS) 500-50 MCG/ACT AEPB 1 puff Inhalation Twice a day     fluticasone-salmeterol (ADVAIR) 500-50 MCG/ACT AEPB Inhale 1 puff into the lungs in the morning and at bedtime.     gabapentin (NEURONTIN) 300 MG capsule Take 1 capsule (300 mg total) by mouth at bedtime. 90 capsule 1    Homeopathic Products (ARNICARE) GEL Apply 1 application  topically daily as needed (soreness).     hydrochlorothiazide (HYDRODIURIL) 25 MG tablet TAKE 2 TABLETS(50 MG) BY MOUTH DAILY 180 tablet 1   levalbuterol (XOPENEX HFA) 45 MCG/ACT inhaler Inhale 2 puffs into the lungs every 6 (six) hours as needed for wheezing or shortness of breath. 3 Inhaler 3   levocetirizine (XYZAL) 5 MG tablet Take 1 tablet (5 mg total) by mouth every evening. 30 tablet 11   levothyroxine (SYNTHROID) 75 MCG tablet Take 1 tablet (75 mcg total) by mouth daily before breakfast. 90 tablet 1   metoprolol succinate (TOPROL-XL) 25 MG 24 hr tablet TAKE 1/2 TABLET BY MOUTH EVERY DAY 45 tablet 2   mirtazapine (REMERON) 15 MG tablet TAKE 1 TABLET(15 MG) BY MOUTH AT BEDTIME Please call in September 2024 to make an November 2024 appointment for more refills 90 tablet 1   montelukast (SINGULAIR) 10 MG tablet Take 1 tablet (10 mg total) by mouth at bedtime. 90 tablet 1   ondansetron (ZOFRAN) 4 MG tablet Take 1 tablet (4 mg total) by mouth every 8 (eight) hours as needed for nausea or vomiting. 40 tablet 1   pantoprazole (PROTONIX) 40 MG tablet Take 1 tablet (40 mg total) by mouth daily. 30 tablet 4   potassium chloride SA (KLOR-CON M) 20 MEQ tablet TAKE 1 TABLET BY MOUTH DAILY 90 tablet 3   rosuvastatin (CRESTOR) 10 MG tablet 1 tablet Orally Twice a week     traMADol (ULTRAM) 50 MG tablet Take 0.5-1 tablets (25-50 mg total) by mouth every 6 (six) hours as needed. 120 tablet 2   triamcinolone cream (KENALOG) 0.1 % Apply 1 Application topically 2 (two) times daily. 453.6 g 0   valACYclovir (VALTREX) 1000 MG tablet Take 2,000 mg by mouth 2 (  two) times daily.     XARELTO 20 MG TABS tablet TAKE 1 TABLET(20 MG) BY MOUTH DAILY WITH SUPPER 90 tablet 1   No current facility-administered medications for this visit.    Allergies  Allergen Reactions   Crestor [Rosuvastatin Calcium]     Severe liver function problems    Hydrocodone-Acetaminophen Anxiety and Itching   Lidocaine Hcl Other (See Comments)    Novacaine:  Becomes very shaky   Lipitor [Atorvastatin Calcium] Other (See Comments)    Elevated liver function    Procaine Other (See Comments)    shaking   Rosuvastatin Other (See Comments)    Severe liver function problems   Theophylline Other (See Comments)    Becomes very shaky skaking skaking   Vicodin [Hydrocodone-Acetaminophen] Itching    Itching all over   Codeine Nausea Only, Anxiety and Other (See Comments)    Also complained of dizziness.  Has same problems with oxycodone and hydrocodone Visual Disturbance, Balance Difficulty, Dizzy "Room Spinning; "Swimming" Balance, vision, nausea, dizzy, "swimming", "room spinning" Balance, vision, nausea, dizzy, "swimming", "room spinning"   Ephedrine Other (See Comments)    shaking   Other Other (See Comments)    Quinine causes nausea, dizzy   Oxycodone Other (See Comments)    Balance, vision, nausea, dizzy, "swimming", "room spinning"   Oxycodone-Acetaminophen Nausea Only, Anxiety and Other (See Comments)    Visual Disturbance, Balance Difficulty, Dizzy "Room Spinning; "Swimming"   Propoxyphene Anxiety, Nausea Only and Other (See Comments)    Visual Disturbance, Balance Difficulty, Dizzy "Room Spinning; "Swimming" Balance, vision, nausea, dizzy, "swimming", "room spinning" Balance, vision, nausea, dizzy, "swimming, "room spinning"   Quinine Derivatives Nausea Only    dizzy   Sulfa Antibiotics Nausea Only    Also complained of dizziness   Epinephrine Other (See Comments)    Patient becomes "shaky" shaking shaking   Penicillins Rash    Pt reported all over body rash in 1958 Has patient had a PCN reaction causing immediate rash, facial/tongue/throat swelling, SOB or lightheadedness with hypotension:Yes Has patient had a PCN reaction causing severe rash involving mucus membranes or skin necrosis: No Has patient had a PCN reaction that  required hospitalization: No Has patient had a PCN reaction occurring within the last 10 years:No     Quinine Other (See Comments)   Theophyllines Other (See Comments)    Patient becomes "shaky"    Review of Systems:  Situational anxiety.     Physical Exam:    BP 130/78   Pulse 67   Ht 5\' 4"  (1.626 m)   Wt 221 lb (100.2 kg)   BMI 37.93 kg/m  Wt Readings from Last 3 Encounters:  02/11/23 221 lb (100.2 kg)  12/31/22 230 lb (104.3 kg)  12/16/22 222 lb (100.7 kg)   Constitutional:  Well-developed, in no acute distress. Psychiatric: Normal mood and affect. Behavior is normal. HEENT: Pupils normal.  Conjunctivae are normal. + scleral icterus. Cardiovascular: Normal rate, regular rhythm. No edema Pulmonary/chest: Effort normal and breath sounds normal. No wheezing, rales or rhonchi. Abdominal: Soft, nondistended. Nontender. Bowel sounds active throughout. There are no masses palpable. No hepatomegaly. Rectal: Deferred Neurological: Alert and oriented to person place and time. Skin: Skin is warm and dry. No rashes noted.  Data Reviewed: I have personally reviewed following labs and imaging studies  CBC:    Latest Ref Rng & Units 12/05/2022   12:16 PM 11/25/2022   10:18 AM 10/15/2022   10:34 PM  CBC  WBC 4.0 - 10.5  K/uL 7.2  6.9    Hemoglobin 12.0 - 15.0 g/dL 65.7  84.6  96.2   Hematocrit 36.0 - 46.0 % 40.0  39.4  39.0   Platelets 150.0 - 400.0 K/uL 153.0  136.0      CMP:    Latest Ref Rng & Units 12/16/2022   11:05 AM 12/05/2022   12:16 PM 11/25/2022   10:18 AM  CMP  Glucose 70 - 99 mg/dL 93  952  841   BUN 6 - 23 mg/dL 19  26  19    Creatinine 0.40 - 1.20 mg/dL 3.24  4.01  0.27   Sodium 135 - 145 mEq/L 140  140  140   Potassium 3.5 - 5.1 mEq/L 3.4  3.6  3.6   Chloride 96 - 112 mEq/L 105  100  102   CO2 19 - 32 mEq/L 27  33  29   Calcium 8.4 - 10.5 mg/dL 9.9  25.3  9.5   Total Protein 6.0 - 8.3 g/dL  6.7  6.4   Total Bilirubin 0.2 - 1.2 mg/dL  1.3  1.3   Alkaline  Phos 39 - 117 U/L  76  74   AST 0 - 37 U/L  20  21   ALT 0 - 35 U/L  13  14       Latest Ref Rng & Units 12/05/2022   12:16 PM 11/25/2022   10:18 AM 10/15/2022   10:12 PM  Hepatic Function  Total Protein 6.0 - 8.3 g/dL 6.7  6.4  5.9   Albumin 3.5 - 5.2 g/dL 4.1  3.9  3.5   AST 0 - 37 U/L 20  21  23    ALT 0 - 35 U/L 13  14  16    Alk Phosphatase 39 - 117 U/L 76  74  60   Total Bilirubin 0.2 - 1.2 mg/dL 1.3  1.3  1.3   Bilirubin, Direct 0.0 - 0.3 mg/dL  0.2        Edman Circle, MD 02/11/2023, 8:51 AM

## 2023-02-11 NOTE — Patient Instructions (Addendum)
_______________________________________________________  If your blood pressure at your visit was 140/90 or greater, please contact your primary care physician to follow up on this.  _______________________________________________________  If you are age 85 or older, your body mass index should be between 23-30. Your Body mass index is 37.93 kg/m. If this is out of the aforementioned range listed, please consider follow up with your Primary Care Provider.  If you are age 65 or younger, your body mass index should be between 19-25. Your Body mass index is 37.93 kg/m. If this is out of the aformentioned range listed, please consider follow up with your Primary Care Provider.   ________________________________________________________  The [facility] providers would like to encourage you to use [facility] to communicate with providers for non-urgent requests or questions.  Due to long hold times on the telephone, sending your provider a message by [facility] may be a faster and more efficient way to get a response.  Please allow 48 business hours for a response.  Please remember that this is for non-urgent requests.  _______________________________________________________  Your provider has requested that you go to the basement level for lab work before leaving today. Press "B" on the elevator. The lab is located at the first door on the left as you exit the elevator.  We have sent the following medications to your pharmacy for you to pick up at your convenience: Protonix Remeron  Please do a low salt diet Avoid medications toxic to the liver  Please follow up in 12 months. Give [place] a call at [phone] to schedule an appointment.  Low-Sodium Eating Plan Salt (sodium) helps you keep a healthy balance of fluids in your body. Too much sodium can raise your blood pressure. It can also cause fluid and waste to be held in your body. Your health care provider or dietitian may recommend a low-sodium  eating plan if you have high blood pressure (hypertension), kidney disease, liver disease, or heart failure. Eating less sodium can help lower your blood pressure and reduce swelling. It can also protect your heart, liver, and kidneys. What are tips for following this plan? Reading food labels  Check food labels for the amount of sodium per serving. If you eat more than one serving, you must multiply the listed amount by the number of servings. Choose foods with less than 140 milligrams (mg) of sodium per serving. Avoid foods with 300 mg of sodium or more per serving. Always check how much sodium is in a product, even if the label says "unsalted" or "no salt added." Shopping  Buy products labeled as "low-sodium" or "no salt added." Buy fresh foods. Avoid canned foods and pre-made or frozen meals. Avoid canned, cured, or processed meats. Buy breads that have less than 80 mg of sodium per slice. Cooking  Eat more home-cooked food. Try to eat less restaurant, buffet, and fast food. Try not to add salt when you cook. Use salt-free seasonings or herbs instead of table salt or sea salt. Check with your provider or pharmacist before using salt substitutes. Cook with plant-based oils, such as canola, sunflower, or olive oil. Meal planning When eating at a restaurant, ask if your food can be made with less salt or no salt. Avoid dishes labeled as brined, pickled, cured, or smoked. Avoid dishes made with soy sauce, miso, or teriyaki sauce. Avoid foods that have monosodium glutamate (MSG) in them. MSG may be added to some restaurant food, sauces, soups, bouillon, and canned foods. Make meals that can be  grilled, baked, poached, roasted, or steamed. These are often made with less sodium. General information Try to limit your sodium intake to 1,500-2,300 mg each day, or the amount told by your provider. What foods should I eat? Fruits Fresh, frozen, or canned fruit. Fruit juice. Vegetables Fresh or  frozen vegetables. "No salt added" canned vegetables. "No salt added" tomato sauce and paste. Low-sodium or reduced-sodium tomato and vegetable juice. Grains Low-sodium cereals, such as oats, puffed wheat and rice, and shredded wheat. Low-sodium crackers. Unsalted rice. Unsalted pasta. Low-sodium bread. Whole grain breads and whole grain pasta. Meats and other proteins Fresh or frozen meat, poultry, seafood, and fish. These should have no added salt. Low-sodium canned tuna and salmon. Unsalted nuts. Dried peas, beans, and lentils without added salt. Unsalted canned beans. Eggs. Unsalted nut butters. Dairy Milk. Soy milk. Cheese that is naturally low in sodium, such as ricotta cheese, fresh mozzarella, or Swiss cheese. Low-sodium or reduced-sodium cheese. Cream cheese. Yogurt. Seasonings and condiments Fresh and dried herbs and spices. Salt-free seasonings. Low-sodium mustard and ketchup. Sodium-free salad dressing. Sodium-free light mayonnaise. Fresh or refrigerated horseradish. Lemon juice. Vinegar. Other foods Homemade, reduced-sodium, or low-sodium soups. Unsalted popcorn and pretzels. Low-salt or salt-free chips. The items listed above may not be all the foods and drinks you can have. Talk to a dietitian to learn more. What foods should I avoid? Vegetables Sauerkraut, pickled vegetables, and relishes. Olives. [place] fries. Onion rings. Regular canned vegetables, except low-sodium or reduced-sodium items. Regular canned tomato sauce and paste. Regular tomato and vegetable juice. Frozen vegetables in sauces. Grains Instant hot cereals. Bread stuffing, pancake, and biscuit mixes. Croutons. Seasoned rice or pasta mixes. Noodle soup cups. Boxed or frozen macaroni and cheese. Regular salted crackers. Self-rising flour. Meats and other proteins Meat or fish that is salted, canned, smoked, spiced, or pickled. Precooked or cured meat, such as sausages or meat loaves. Tomasa Blase. Ham. Pepperoni. Hot dogs.  Corned beef. Chipped beef. Salt pork. Jerky. Pickled herring, anchovies, and sardines. Regular canned tuna. Salted nuts. Dairy Processed cheese and cheese spreads. Hard cheeses. Cheese curds. Blue cheese. Feta cheese. String cheese. Regular cottage cheese. Buttermilk. Canned milk. Fats and oils Salted butter. Regular margarine. Ghee. Bacon fat. Seasonings and condiments Onion salt, garlic salt, seasoned salt, table salt, and sea salt. Canned and packaged gravies. Worcestershire sauce. Tartar sauce. Barbecue sauce. Teriyaki sauce. Soy sauce, including reduced-sodium soy sauce. Steak sauce. Fish sauce. Oyster sauce. Cocktail sauce. Horseradish that you find on the shelf. Regular ketchup and mustard. Meat flavorings and tenderizers. Bouillon cubes. Hot sauce. Pre-made or packaged marinades. Pre-made or packaged taco seasonings. Relishes. Regular salad dressings. Salsa. Other foods Salted popcorn and pretzels. Corn chips and puffs. Potato and tortilla chips. Canned or dried soups. Pizza. Frozen entrees and pot pies. The items listed above may not be all the foods and drinks you should avoid. Talk to a dietitian to learn more. This information is not intended to replace advice given to you by your health care provider. Make sure you discuss any questions you have with your health care provider. Document Revised: 05/09/2022 Document Reviewed: 05/09/2022 Elsevier Patient Education  2024 [employer].   Thank you,  Dr. Lynann Bologna

## 2023-02-12 ENCOUNTER — Ambulatory Visit: Payer: Medicare Other

## 2023-02-12 DIAGNOSIS — R42 Dizziness and giddiness: Secondary | ICD-10-CM | POA: Diagnosis not present

## 2023-02-12 DIAGNOSIS — R2681 Unsteadiness on feet: Secondary | ICD-10-CM | POA: Diagnosis not present

## 2023-02-12 DIAGNOSIS — M6281 Muscle weakness (generalized): Secondary | ICD-10-CM

## 2023-02-12 DIAGNOSIS — M542 Cervicalgia: Secondary | ICD-10-CM | POA: Diagnosis not present

## 2023-02-12 DIAGNOSIS — R262 Difficulty in walking, not elsewhere classified: Secondary | ICD-10-CM

## 2023-02-12 LAB — COMPREHENSIVE METABOLIC PANEL
ALT: 13 U/L (ref 0–35)
AST: 21 U/L (ref 0–37)
Albumin: 4.2 g/dL (ref 3.5–5.2)
Alkaline Phosphatase: 78 U/L (ref 39–117)
BUN: 19 mg/dL (ref 6–23)
CO2: 21 meq/L (ref 19–32)
Calcium: 10.1 mg/dL (ref 8.4–10.5)
Chloride: 107 meq/L (ref 96–112)
Creatinine, Ser: 1.05 mg/dL (ref 0.40–1.20)
GFR: 48.46 mL/min — ABNORMAL LOW (ref >60.00)
Glucose, Bld: 112 mg/dL — ABNORMAL HIGH (ref 70–99)
Potassium: 3.9 meq/L (ref 3.5–5.1)
Sodium: 140 meq/L (ref 135–145)
Total Bilirubin: 1.5 mg/dL — ABNORMAL HIGH (ref 0.2–1.2)
Total Protein: 6.3 g/dL (ref 6.0–8.3)

## 2023-02-12 NOTE — Therapy (Signed)
OUTPATIENT PHYSICAL THERAPY VESTIBULAR TREATMENT     Patient Name: [name] MRN: [id number] DOB:09/19/1935, 87 y.o., female Today's Date: 02/12/2023    END OF SESSION:  PT End of Session - 02/12/23 1455     Visit Number 20    Number of Visits 26    Date for PT Re-Evaluation 02/11/23    Authorization Type [employer]    Progress Note Due on Visit 24    PT Start Time 1453   late arrival   PT Stop Time 1530    PT Time Calculation (min) 37 min    Equipment Utilized During Treatment Gait belt    Activity Tolerance Patient tolerated treatment well    Behavior During Therapy WFL for tasks assessed/performed                    Past Medical History:  Diagnosis Date   Allergy    Anxiety    Arthritis    no cartlidge in knees   Asthma    Bradycardia    a. atenolol stopped due to this, HR 40s.   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  CAD s/p RCA stent 2000 with residual mild-mod disease of left system 2001.   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   Osteoporosis 03/24/2015   Peripheral neuropathy 03/24/2015   Plantar fasciitis    Sleep apnea    Past Surgical History:  Procedure Laterality Date   BREAST LUMPECTOMY W/ NEEDLE LOCALIZATION  01/29/2011   Left with SL[facility] Dr [name]   CHOLECYSTECTOMY  1955   CORONARY ANGIOPLASTY WITH STENT PLACEMENT  2000   DILATION AND CURETTAGE OF UTERUS  1976   and 1996   HAND SURGERY  1992   tendons between thumb and forefinger   SKIN BIOPSY     1994, 2006, 2008, 2010, 2011, 2011, 2012-  pre-cancerous   TONSILLECTOMY  1955   Patient Active Problem  List   Diagnosis Date Noted   Acute renal failure superimposed on chronic kidney disease (HCC) 12/16/2022   Cloudy urine 11/25/2022   Insomnia 07/27/2022   Vitamin D deficiency 07/27/2022   Pulmonary hypertension (HCC) 11/07/2021   Bronchiectasis without complication (HCC) 11/07/2021   Hepatic cirrhosis (HCC) 11/06/2021   ILD (interstitial lung disease) (HCC) 10/03/2021   Abnormal LFTs 09/28/2021   Weight loss 08/19/2021   Chronic respiratory failure with hypoxia (HCC) 08/19/2021   Debility 06/29/2021   Multifocal pneumonia 06/15/2021   Elevated troponin 06/12/2021   Febrile illness 05/22/2021   Anxiety state 10/23/2020   Pure hypercholesterolemia 10/23/2020   Urinary tract infectious disease 10/23/2020   Atherosclerosis of coronary artery 10/23/2020   Sinus node dysfunction (HCC) 10/23/2020   Mixed hyperlipidemia 10/23/2020   OSA on CPAP 10/23/2020   Fall 09/05/2020   Nausea 04/23/2020   Constipation 04/23/2020   Posterior uveitis, 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   Dysphagia 01/21/2020   CHF (congestive heart failure) (HCC) 11/23/2019   Exposure to COVID-19 virus 12/04/2018   Osteoarthritis 09/19/2018   Abscess of left leg 09/26/2017   Left leg  cellulitis 09/18/2017   Whiplash 06/10/2017   Cough 05/29/2017   Concussion 05/29/2017   Hammer toes of both feet 12/19/2016   Allergic rhinitis 12/09/2016   Pedal edema 09/17/2016   Chest pain 07/18/2016   Gross hematuria 04/26/2016   Prediabetes 03/21/2016   Dysuria 12/07/2015   Fever blister 12/07/2015   Choroidal nevus, 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   Malignant neoplasm of left female breast (HCC) 10/10/2015   Anxiety and depression 06/30/2015   Encounter for well adult exam with abnormal findings 06/30/2015   Neck mass 03/24/2015   Hypothyroidism 03/24/2015   Gout 03/24/2015   Essential hypertension 03/24/2015   Asthma 03/24/2015   Peripheral neuropathy 03/24/2015   Osteoporosis 03/24/2015   Morbid obesity (HCC) 03/24/2015   Long  term current use of anticoagulant therapy 03/16/2015   Lymphadenopathy, cervical 03/16/2015   Diarrhea 03/16/2015   Hyperbilirubinemia 03/16/2015   Coronary arteriosclerosis 09/22/2014   Hyperlipidemia 07/23/2013   Hypokalemia 02/18/2013   Elevated bilirubin 02/18/2013   Morbid obesity with BMI of 45.0-49.9, adult (HCC) 10/29/2012   Rotator cuff tear arthropathy 10/29/2012   DJD (degenerative joint disease) of knee 10/29/2012   Plantar fasciitis    Arthritis    Hx of radiation therapy    Atrial fibrillation, chronic (HCC) 01/21/2011   Malignant neoplasm of upper-outer quadrant of left breast in female, estrogen receptor positive (HCC) 12/26/2010   Breast cancer (IL[facility]) Receptor + her2 _ 12/26/2010    PCP: Mercedes Levins, MD REFERRING PROVIDER: Rodolph Bong, MD  REFERRING DIAG:  S06.0X0D (ICD-10-CM) - Concussion without loss of consciousness, subsequent encounter    THERAPY DIAG:  Muscle weakness (generalized)  Unsteadiness on feet  Difficulty in walking, not elsewhere classified  Dizziness and giddiness  Cervicalgia  ONSET DATE: 10/15/22  Rationale for Evaluation and Treatment: Rehabilitation  SUBJECTIVE:   SUBJECTIVE STATEMENT: Saw neurosurgeon and they confirmed dens fracture but it was ideally in the anterior body and is healing well.  MD provides note indicating safe to proceed with PT and treating neck as indicated "safely"  Pt accompanied by: self  PERTINENT HISTORY: [name],  is a 87 y.o. female who presents for initial evaluation of a head injury and multiple contusions. MOI: She fell/rolled out of bed due to her nightgown being tangled in the bedding. She was transported via EMS to the [facility] ED and dx w/ a levator scapula hematoma. Today, pt c/o cont'd confusion, soreness in her neck, shoulders, and thoracic spine. Her daughter notes a "bump" on the back of pt's head.   PAIN:  Are you having pain? Yes: NPRS scale: 5-6/10 Pain location: "the whole  neck"  Pain description: stiff Aggravating factors: mainly at night  Relieving factors: extra strength tylenol  PRECAUTIONS: None  WEIGHT BEARING RESTRICTIONS: No  FALLS: Has patient fallen in last 6 months? Yes. Number of falls 1  LIVING ENVIRONMENT: Lives with: lives alone Lives in: House/apartment Stairs: Yes: Internal: 2-3 steps; can reach both and typically uses rollator , ramp for entering/exiting home Has following equipment at home: Dan Humphreys - 4 wheeled  PLOF: Independent with household mobility with device and Independent with community mobility with device  PATIENT GOALS: reduce pain  OBJECTIVE:   TODAY'S TREATMENT: 02/12/23 Activity Comments  HEP review Addressed cervical stretching to emphasis gentle stretch within limited ROM for safety  Soft tissue mobilization -STM to bilateral cervical column, upper traps, levator scap to reduce guarding, pain, trigger points  TODAY'S TREATMENT: 02/05/23 Activity Comments  Discussion of c-spine anatomy for orientation and role of dens   Multi-sensory balance Limited head movements  Sidestepping x 2 min   Retro-walking x 2 min                   HOME EXERCISE PROGRAM Last updated: 12/12/22 Access Code: [email or web address] URL: [email or web address] Date: 12/12/2022 Prepared by: [facility] - Outpatient  Rehab - [facility]  Exercises - Seated Cervical Sidebending Stretch  - 1 x daily - 7 x weekly - 3 sets - 30-60 sec hold - Seated Upper Trap Stretch  - 1 x daily - 7 x weekly - 3 sets - 30-60 sec hold - Cervical Retraction with Overpressure  - 1 x daily - 5 x weekly - 2 sets - 10 reps - 3 sec hold - Seated Assisted Cervical Rotation with Towel  - 1 x daily - 5 x weekly - 2 sets - 10 reps - Mid-Lower Cervical Extension SNAG with Strap  - 1 x daily - 5 x weekly - 2 sets - 10 reps - Romberg Stance with Eyes Closed  - 1 x daily - 5 x weekly - 2 sets - 30 sec hold - Romberg Stance with Head Nods  -  1 x daily - 5 x weekly - 2 sets - 30 sec hold - Supine Cervical Retraction with Towel  - 1 x daily - 7 x weekly - 2 sets - 5 reps - 3 sec hold - Standing Toe Taps  - 1 x daily - 5 x weekly - 2 sets - 10 reps - Corner Balance Feet Together With Eyes Open  - 1 x daily - 5 x weekly - 2 sets - 30 sec hold - Corner Balance Feet Together With Eyes Closed  - 1 x daily - 5 x weekly - 2 sets - 30 sec hold - Shoulder External Rotation and Scapular Retraction with Resistance  - 1 x daily - 7 x weekly - 1-3 sets - 10 reps - Seated Shoulder Horizontal Abduction with Resistance  - 1 x daily - 7 x weekly - 3 sets - 10 reps - Pencil Pushups  - 1 x daily - 7 x weekly - 1-3 sets - 10 reps     Below measures were taken at time of initial evaluation unless otherwise specified:   DIAGNOSTIC FINDINGS:  IMPRESSION: No acute bony abnormality. Degenerative disc and facet disease throughout the cervical spine.   Enlargement of the left levator scapulae muscle with surrounding stranding most compatible with intramuscular hematoma.  COGNITION: Overall cognitive status: Within functional limits for tasks assessed   SENSATION: WNL  EDEMA:    MUSCLE TONE:  WNL   POSTURE:  No Significant postural limitations  Cervical ROM:    Active A/PROM (deg) eval  Flexion 50  Extension 40  Right lateral flexion 15  Left lateral flexion 15  Right rotation 15  Left rotation 20  (Blank rows = not tested)  UE ROM: WFL--able to elevate arms overhead full ROM but with discomfort in LUE  STRENGTH: 5/5 gross BUE  PALPATION:  multiple areas of tenderness/trigger points throughout left shoulder girdle and neck--more soft tissue than joint discomfort of the area  BED MOBILITY:  independent  TRANSFERS: Assistive device utilized: Environmental consultant - 4 wheeled  Sit to stand: Modified independence Stand to sit: Modified independence Chair to chair: Modified independence Floor:  NT  RAMP: Modified independence  CURB:  Modified independence  GAIT: Gait  pattern: WFL Distance walked:  Assistive device utilized: Environmental consultant - 4 wheeled Level of assistance: Modified independence Comments:   FUNCTIONAL TESTS:  Timed up and go (TUG): 23 sec w/ rollator Dynamic Gait Index: 13/24  M-CTSIB  Condition 1: Firm Surface, EO 30 Sec, Normal and Mild Sway  Condition 2: Firm Surface, EC 10 Sec, Moderate and Severe Sway  Condition 3: Foam Surface, EO 30 Sec, Normal Sway  Condition 4: Foam Surface, EC 4 Sec, Severe Sway        GOALS: Goals reviewed with patient? Yes  SHORT TERM GOALS: Target date: 11/15/2022    Patient will be independent in HEP to improve functional outcomes Baseline: Goal status: MET    LONG TERM GOALS: Target date: 02/12/2023      Exhibit improved C-spine ROM to 25 degrees lateral flexion and 30+ degrees rotation to improve comfort and safety when driving Baseline:  Flexion: 45 deg Extension: 30-40 Right lateral flexion: 20-25  Left lateral: 20 Right rotation: 20 deg Left rotation: 25 deg Goal status: IN PROGRESS  2.  Report left shoulder pain not exceeding 3/10 with overhead movements to improve comfort with household activity and self-care tasks Baseline: 2-3/10 Goal status: MET  3.  Demo reduced risk for falls per time 15 sec TUG test Baseline: 23 sec w/ rollator; (12/03/22) 21 sec w/ rollator Goal status: IN PROGRESS  4.  Improved postural stability per mild sway conditions 2 and 4 M-CTSIB x 15 sec to improve safety with ADL Baseline: modertate x 20 sec Goal status: IN PROGRESS  5.  Demo reduced risk for falls per score 19/24 Dynamic Gait Index  Baseline: 15/24  Goal status: INITIAL  ASSESSMENT:  CLINICAL IMPRESSION: Pt returns to clinic and reports she has been cleared by neurosurgery and Dr. Franky Macho indicates continued c-spine therapy provided done "safely" discussed activity modifications for c-spine HEP and discussed avoiding traction at this time, pt verbalizes  and demonstrates understanding.  Notes continued neck pain from muscle tension/guarding.  Trigger points along bilat upper trap and cervical column.  Trigger point/hematoma left levator is now absent.  Reports decreased neck pain to 2/10 post-tx. Continued sessions to progress balance, C-spine rehab as HA and dizziness is no longer present.   OBJECTIVE IMPAIRMENTS: decreased activity tolerance, decreased balance, decreased ROM, increased muscle spasms, impaired flexibility, and pain.   ACTIVITY LIMITATIONS: carrying, bending, sleeping, dressing, and reach over head  PARTICIPATION LIMITATIONS: meal prep, cleaning, laundry, driving, shopping, and community activity  PERSONAL FACTORS: Time since onset of injury/illness/exacerbation and 1-2 comorbidities: knee OA  are also affecting patient's functional outcome.   REHAB POTENTIAL: Excellent  CLINICAL DECISION MAKING: Stable/uncomplicated  EVALUATION COMPLEXITY: Low   PLAN:  PT FREQUENCY: 1-2x/week, plan is for 1x/wk if neck pain does not continue to limit  PT DURATION: 6 weeks  PLANNED INTERVENTIONS: Therapeutic exercises, Therapeutic activity, Neuromuscular re-education, Balance training, Gait training, Patient/Family education, Self Care, Joint mobilization, Stair training, Vestibular training, Canalith repositioning, DME instructions, Aquatic Therapy, Dry Needling, Electrical stimulation, Spinal mobilization, Cryotherapy, Moist heat, Traction, Ultrasound, Ionotophoresis 4mg /ml Dexamethasone, and Manual therapy  PLAN FOR NEXT SESSION: balance activities, STM as indicated   2:56 PM, 02/12/23 M. Shary Decamp, PT, DPT Physical Therapist- [place] Office Number: [phone]

## 2023-02-19 ENCOUNTER — Ambulatory Visit: Payer: Medicare Other

## 2023-02-19 DIAGNOSIS — R2681 Unsteadiness on feet: Secondary | ICD-10-CM

## 2023-02-19 DIAGNOSIS — M6281 Muscle weakness (generalized): Secondary | ICD-10-CM | POA: Diagnosis not present

## 2023-02-19 DIAGNOSIS — R262 Difficulty in walking, not elsewhere classified: Secondary | ICD-10-CM

## 2023-02-19 DIAGNOSIS — M542 Cervicalgia: Secondary | ICD-10-CM | POA: Diagnosis not present

## 2023-02-19 DIAGNOSIS — R42 Dizziness and giddiness: Secondary | ICD-10-CM | POA: Diagnosis not present

## 2023-02-19 NOTE — Therapy (Signed)
OUTPATIENT PHYSICAL THERAPY VESTIBULAR TREATMENT and D/C Summary     Patient Name: [name] MRN: [id number] DOB:[birth year], 87 y.o., female Today's Date: 02/19/2023   PHYSICAL THERAPY DISCHARGE SUMMARY  Visits from Start of Care: 21  Current functional level related to goals / functional outcomes: See below for outcome measures   Remaining deficits: C-spine ROM limitations, balance deficits   Education / Equipment: HEP   Patient agrees to discharge. Patient goals were partially met. Patient is being discharged due to being pleased with the current functional level.  END OF SESSION:  PT End of Session - 02/19/23 1455     Visit Number 21    Number of Visits 26    Date for PT Re-Evaluation 03/19/23    Authorization Type [employer]    Progress Note Due on Visit 24    PT Start Time 1453    PT Stop Time 1530    PT Time Calculation (min) 37 min    Equipment Utilized During Treatment Gait belt    Activity Tolerance Patient tolerated treatment well    Behavior During Therapy WFL for tasks assessed/performed                    Past Medical History:  Diagnosis Date   Allergy    Anxiety    Arthritis    no cartlidge in knees   Asthma    Bradycardia    a. atenolol stopped due to this, HR 40s.   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  CAD s/p RCA stent 2000 with residual mild-mod disease of left system 2001.   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   Osteoporosis 03/24/2015   Peripheral neuropathy 03/24/2015   Plantar fasciitis    Sleep apnea    Past Surgical History:   Procedure Laterality Date   BREAST LUMPECTOMY W/ NEEDLE LOCALIZATION  01/29/2011   Left with [facility] Dr Jamey Ripa   CHOLECYSTECTOMY  1955   CORONARY ANGIOPLASTY WITH STENT PLACEMENT  2000   DILATION AND CURETTAGE OF UTERUS  1976   and 1996   HAND SURGERY  1992   tendons between thumb and forefinger   SKIN BIOPSY     1994, 2006, 2008, 2010, 2011, 2011, 2012-  pre-cancerous   TONSILLECTOMY  1955   Patient Active Problem List   Diagnosis Date Noted   Acute renal failure superimposed on chronic kidney disease (HCC) 12/16/2022   Cloudy urine 11/25/2022   Insomnia 07/27/2022   Vitamin D deficiency 07/27/2022   Pulmonary hypertension (HCC) 11/07/2021   Bronchiectasis without complication (HCC) 11/07/2021   Hepatic cirrhosis (HCC) 11/06/2021   ILD (interstitial lung disease) (HCC) 10/03/2021   Abnormal LFTs 09/28/2021   Weight loss 08/19/2021   Chronic respiratory failure with hypoxia (HCC) 08/19/2021   Debility 06/29/2021   Multifocal pneumonia 06/15/2021   Elevated troponin 06/12/2021   Febrile illness 05/22/2021   Anxiety state 10/23/2020   Pure hypercholesterolemia 10/23/2020   Urinary tract infectious disease 10/23/2020   Atherosclerosis of coronary artery 10/23/2020   Sinus node dysfunction (HCC)  10/23/2020   Mixed hyperlipidemia 10/23/2020   OSA on CPAP 10/23/2020   Fall 09/05/2020   Nausea 04/23/2020   Constipation 04/23/2020   Posterior uveitis, 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   Dysphagia 01/21/2020   CHF (congestive heart failure) (HCC) 11/23/2019   Exposure to COVID-19 virus 12/04/2018   Osteoarthritis 09/19/2018   Abscess of left leg 09/26/2017   Left leg cellulitis 09/18/2017   Whiplash 06/10/2017   Cough 05/29/2017   Concussion 05/29/2017   Hammer toes of both feet 12/19/2016   Allergic rhinitis 12/09/2016   Pedal edema 09/17/2016   Chest pain 07/18/2016   Gross hematuria 04/26/2016   Prediabetes 03/21/2016   Dysuria 12/07/2015   Fever blister 12/07/2015   Choroidal  nevus, 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   Malignant neoplasm of left female breast (HCC) 10/10/2015   Anxiety and depression 06/30/2015   Encounter for well adult exam with abnormal findings 06/30/2015   Neck mass 03/24/2015   Hypothyroidism 03/24/2015   Gout 03/24/2015   Essential hypertension 03/24/2015   Asthma 03/24/2015   Peripheral neuropathy 03/24/2015   Osteoporosis 03/24/2015   Morbid obesity (HCC) 03/24/2015   Long term current use of anticoagulant therapy 03/16/2015   Lymphadenopathy, cervical 03/16/2015   Diarrhea 03/16/2015   Hyperbilirubinemia 03/16/2015   Coronary arteriosclerosis 09/22/2014   Hyperlipidemia 07/23/2013   Hypokalemia 02/18/2013   Elevated bilirubin 02/18/2013   Morbid obesity with BMI of 45.0-49.9, adult (HCC) 10/29/2012   Rotator cuff tear arthropathy 10/29/2012   DJD (degenerative joint disease) of knee 10/29/2012   Plantar fasciitis    Arthritis    Hx of radiation therapy    Atrial fibrillation, chronic (HCC) 01/21/2011   Malignant neoplasm of upper-outer quadrant of left breast in female, estrogen receptor positive (HCC) 12/26/2010   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 female who presents for initial evaluation of a head injury and multiple contusions. MOI: She fell/rolled out of bed due to her nightgown being tangled in the bedding. She was transported via EMS to the [facility] ED and dx w/ a levator scapula hematoma.  Today, pt c/o cont'd confusion, soreness in her neck, shoulders, and thoracic spine. Her daughter notes a "bump" on the back of pt's head.   PAIN:  Are you having pain? Yes: NPRS scale: 5-6/10 Pain location: "the whole neck"  Pain description: stiff Aggravating factors: mainly at night  Relieving factors: extra strength tylenol  PRECAUTIONS: None  WEIGHT BEARING RESTRICTIONS: No  FALLS: Has patient fallen in last 6 months? Yes. Number of falls 1  LIVING ENVIRONMENT: Lives with: lives alone Lives in: House/apartment Stairs: Yes: Internal: 2-3 steps; can reach both and typically uses rollator , ramp for entering/exiting home Has following equipment at home: Dan Humphreys - 4 wheeled  PLOF: Independent with household mobility with device and Independent with community mobility with device  PATIENT GOALS: reduce pain  OBJECTIVE:   TODAY'S TREATMENT: 02/19/23 Activity Comments  LTG See below   POC review   Pt education  Self-mgmt and recommendation of maintenance massage for neck              TODAY'S TREATMENT: 02/12/23 Activity Comments  HEP review Addressed cervical stretching to emphasis gentle stretch within limited ROM for safety  Soft tissue mobilization -STM to bilateral cervical column, upper traps, levator scap to reduce guarding, pain, trigger points                 TODAY'S TREATMENT: 02/05/23 Activity Comments  Discussion of c-spine anatomy for orientation and role of dens   Multi-sensory balance Limited head movements  Sidestepping x 2 min   Retro-walking x 2 min                   HOME EXERCISE PROGRAM Last updated: 12/12/22 Access Code: [email or web address] URL: [email or web address] Date: 12/12/2022 Prepared by: [facility] - Outpatient  Rehab - [facility]  Exercises - Seated Cervical Sidebending Stretch  - 1 x daily - 7 x weekly - 3 sets - 30-60 sec hold - Seated Upper Trap Stretch  - 1 x daily - 7 x weekly - 3 sets - 30-60 sec hold -  Cervical Retraction with Overpressure  - 1 x daily - 5 x weekly - 2 sets - 10 reps - 3 sec hold - Seated Assisted Cervical Rotation with Towel  - 1 x daily - 5 x weekly - 2 sets - 10 reps - Mid-Lower Cervical Extension SNAG with Strap  - 1 x daily - 5 x weekly - 2 sets - 10 reps - Romberg Stance with Eyes Closed  - 1 x daily - 5 x weekly - 2 sets - 30 sec hold - Romberg Stance with Head Nods  - 1 x daily - 5 x weekly - 2 sets - 30 sec hold - Supine Cervical Retraction with Towel  - 1 x daily - 7 x weekly - 2 sets - 5 reps - 3 sec hold - Standing Toe Taps  - 1 x daily - 5 x weekly - 2 sets - 10 reps - Corner Balance Feet Together With Eyes Open  - 1 x daily - 5 x weekly - 2 sets - 30 sec hold - Corner Balance Feet Together With Eyes Closed  - 1 x daily - 5 x weekly - 2 sets - 30 sec hold - Shoulder External Rotation and Scapular Retraction with Resistance  - 1 x daily - 7 x weekly - 1-3 sets - 10 reps - Seated Shoulder Horizontal Abduction with Resistance  - 1 x daily - 7 x weekly - 3 sets - 10 reps - Pencil Pushups  - 1 x daily - 7 x weekly - 1-3 sets - 10 reps     Below measures were taken at time of initial evaluation unless otherwise specified:   DIAGNOSTIC FINDINGS:  IMPRESSION: No acute bony abnormality. Degenerative disc and facet disease throughout the cervical spine.   Enlargement of the left levator scapulae muscle with surrounding stranding most compatible with intramuscular hematoma.  COGNITION: Overall cognitive status: Within functional limits for tasks assessed   SENSATION: WNL  EDEMA:    MUSCLE TONE:  WNL   POSTURE:  No Significant postural limitations  Cervical ROM:    Active A/PROM (deg) eval AROM 02/19/23  Flexion 50 50  Extension 40 25  Right lateral flexion 15 20  Left lateral flexion 15 25  Right rotation 15 20  Left rotation 20 25  (Blank rows = not tested)  UE ROM:  WFL--able to elevate arms overhead full ROM but with discomfort in  LUE  STRENGTH: 5/5 gross BUE  PALPATION:  multiple areas of tenderness/trigger points throughout left shoulder girdle and neck--more soft tissue than joint discomfort of the area  BED MOBILITY:  independent  TRANSFERS: Assistive device utilized: Environmental consultant - 4 wheeled  Sit to stand: Modified independence Stand to sit: Modified independence Chair to chair: Modified independence Floor:  NT  RAMP: Modified independence  CURB: Modified independence  GAIT: Gait pattern: WFL Distance walked:  Assistive device utilized: Environmental consultant - 4 wheeled Level of assistance: Modified independence Comments:   FUNCTIONAL TESTS:  Timed up and go (TUG): 23 sec w/ rollator Dynamic Gait Index: 13/24  M-CTSIB  Condition 1: Firm Surface, EO 30 Sec, Normal and Mild Sway  Condition 2: Firm Surface, EC 10 Sec, Moderate and Severe Sway  Condition 3: Foam Surface, EO 30 Sec, Normal Sway  Condition 4: Foam Surface, EC 4 Sec, Severe Sway        GOALS: Goals reviewed with patient? Yes  SHORT TERM GOALS: Target date: 11/15/2022    Patient will be independent in HEP to improve functional outcomes Baseline: Goal status: MET    LONG TERM GOALS: Target date: 02/12/2023      Exhibit improved C-spine ROM to 25 degrees lateral flexion and 30+ degrees rotation to improve comfort and safety when driving Baseline: see above Goal status: NOT MET  2.  Report left shoulder pain not exceeding 3/10 with overhead movements to improve comfort with household activity and self-care tasks Baseline: 2-3/10; 0/10 pain, full ROM Goal status: MET  3.  Demo reduced risk for falls per time 15 sec TUG test Baseline: 23 sec w/ rollator; (12/03/22) 21 sec w/ rollator; (02/19/23) 14 sec w/ rollator Goal status: MET  4.  Improved postural stability per mild sway conditions 2 and 4 M-CTSIB x 15 sec to improve safety with ADL Baseline: modertate x 20 sec; (02/19/23) moderate x 30 sec Goal status: NOT MET  5.  Demo reduced  risk for falls per score 19/24 Dynamic Gait Index  Baseline: 15/24; (02/19/23) 16/24  Goal status: NOT MET  ASSESSMENT:  CLINICAL IMPRESSION: Reports some improvement in neck pain since last session.  Pt and therapist note that neck tightness/spasms tend to wax and wane and overall her ROM has improved in cervical spine movements but given recent MRI report which indicates two-level degenerative fusion this is also likely contributing to continued limitations.  Able to achieve full UE ROM without pain or weakness and demo improved ambulation safety with TUG test and slight improvement with Dynamic Gait Index from initial score.  Pt verbalizes understanding of HEP activities and reports she will be pursuing physical activity via swimming.  Recommend maintenance massage therapy for her on-going neck pain as this provided temporary relief.  Pt to D/C to HEP at this time and f/u as indicated.   OBJECTIVE IMPAIRMENTS: decreased activity tolerance, decreased balance, decreased ROM, increased muscle spasms, impaired flexibility, and pain.   ACTIVITY LIMITATIONS: carrying, bending, sleeping, dressing, and reach over head  PARTICIPATION LIMITATIONS: meal prep, cleaning, laundry, driving, shopping, and community activity  PERSONAL FACTORS: Time since onset of injury/illness/exacerbation and 1-2 comorbidities: knee OA  are also affecting patient's functional outcome.   REHAB POTENTIAL: Excellent  CLINICAL DECISION MAKING: Stable/uncomplicated  EVALUATION COMPLEXITY: Low   PLAN:  PT FREQUENCY: 1-2x/week, plan is for 1x/wk if neck pain does not continue to limit  PT DURATION: 6 weeks  PLANNED INTERVENTIONS:  Therapeutic exercises, Therapeutic activity, Neuromuscular re-education, Balance training, Gait training, Patient/Family education, Self Care, Joint mobilization, Stair training, Vestibular training, Canalith repositioning, DME instructions, Aquatic Therapy, Dry Needling, Electrical stimulation,  Spinal mobilization, Cryotherapy, Moist heat, Traction, Ultrasound, Ionotophoresis 4mg /ml Dexamethasone, and Manual therapy  PLAN FOR NEXT SESSION: D/C to HEP   2:56 PM, 02/19/23 M. Shary Decamp, PT, DPT Physical Therapist- [place] Office Number: [phone]

## 2023-02-26 ENCOUNTER — Ambulatory Visit: Payer: Medicare Other | Admitting: Emergency Medicine

## 2023-02-28 DIAGNOSIS — Z1231 Encounter for screening mammogram for malignant neoplasm of breast: Secondary | ICD-10-CM | POA: Diagnosis not present

## 2023-02-28 LAB — HM MAMMOGRAPHY

## 2023-03-03 DIAGNOSIS — M1009 Idiopathic gout, multiple sites: Secondary | ICD-10-CM | POA: Diagnosis not present

## 2023-03-03 DIAGNOSIS — Z79899 Other long term (current) drug therapy: Secondary | ICD-10-CM | POA: Diagnosis not present

## 2023-03-03 DIAGNOSIS — M1991 Primary osteoarthritis, unspecified site: Secondary | ICD-10-CM | POA: Diagnosis not present

## 2023-03-15 ENCOUNTER — Encounter (HOSPITAL_COMMUNITY): Payer: Self-pay

## 2023-03-15 ENCOUNTER — Ambulatory Visit (HOSPITAL_COMMUNITY)
Admission: EM | Admit: 2023-03-15 | Discharge: 2023-03-15 | Disposition: A | Discharge status: Home / Self Care | Payer: Medicare Other | Attending: Emergency Medicine | Admitting: Emergency Medicine

## 2023-03-15 DIAGNOSIS — S51819A Laceration without foreign body of unspecified forearm, initial encounter: Secondary | ICD-10-CM

## 2023-03-15 DIAGNOSIS — Z23 Encounter for immunization: Secondary | ICD-10-CM | POA: Diagnosis not present

## 2023-03-15 MED ORDER — TETANUS-DIPHTH-ACELL PERTUSSIS 5-2.5-18.5 LF-MCG/0.5 IM SUSY
0.5000 mL | PREFILLED_SYRINGE | Freq: Once | INTRAMUSCULAR | Status: AC
  Administered 2023-03-15: 0.5 mL via INTRAMUSCULAR

## 2023-03-15 MED ORDER — MUPIROCIN 2 % EX OINT: 1.0000 | TOPICAL_OINTMENT | Freq: Two times a day (BID) | CUTANEOUS | 0 refills | Status: DC

## 2023-03-15 MED ORDER — TETANUS-DIPHTH-ACELL PERTUSSIS 5-2.5-18.5 LF-MCG/0.5 IM SUSY
PREFILLED_SYRINGE | INTRAMUSCULAR | Status: AC
  Filled 2023-03-15: qty 0.5

## 2023-03-15 MED ORDER — TETANUS-DIPHTH-ACELL PERTUSSIS 5-2.5-18.5 LF-MCG/0.5 IM SUSY
PREFILLED_SYRINGE | INTRAMUSCULAR | Status: AC
Start: 2023-03-15 — End: ?
  Filled 2023-03-15: qty 0.5

## 2023-03-15 NOTE — ED Triage Notes (Addendum)
Pt presents with right arm laceration at this time "after I bumped into the porch railing" about 45 minutes PTA. Band-Aid is applied, bleeding is controlled. Pt is currently taking blood thinners.

## 2023-03-15 NOTE — ED Notes (Signed)
Wound care provided by [place] NP. Non-adherent pad applied, wrapped with guaze and coban.

## 2023-03-15 NOTE — ED Notes (Addendum)
This RN removed pt's Band-aid to assess wound site. Bleeding is controlled. Non-adherent pad applied and wrapped with coban. Pt is currently waiting for to be seen by provider.

## 2023-03-15 NOTE — ED Provider Notes (Signed)
[facility]    CSN: [id number] Arrival date & time: 03/15/23  1637      History   Chief Complaint Chief Complaint  Patient presents with   Extremity Laceration    HPI [name] is a 87 y.o. female.   Patient presents to clinic for a right forearm skin tear that happened earlier today.  She was cleaning off her porch when she hit her arm on one of the railing.  She is unsure when her last Tdap is.  She is on a blood thinner, bleeding controlled in clinic with a dressing.  The history is provided by the patient and medical records.    Past Medical History:  Diagnosis Date   Allergy    Anxiety    Arthritis    no cartlidge in knees   Asthma    Bradycardia    a. atenolol stopped due to this, HR 40s.   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  CAD s/p RCA stent 2000 with residual mild-mod disease of left system 2001.   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   Osteoporosis 03/24/2015   Peripheral neuropathy 03/24/2015   Plantar fasciitis    Sleep apnea     Patient Active Problem List   Diagnosis Date Noted   Acute renal failure superimposed on chronic kidney disease (HCC) 12/16/2022   Cloudy urine 11/25/2022   Insomnia 07/27/2022   Vitamin D deficiency 07/27/2022   Pulmonary hypertension (HCC) 11/07/2021   Bronchiectasis without complication (HCC) 11/07/2021   Hepatic cirrhosis (HCC) 11/06/2021   ILD (interstitial lung disease) (HCC) 10/03/2021   Abnormal LFTs 09/28/2021   Weight loss 08/19/2021   Chronic respiratory failure with hypoxia (HCC) 08/19/2021   Debility 06/29/2021   Multifocal  pneumonia 06/15/2021   Elevated troponin 06/12/2021   Febrile illness 05/22/2021   Anxiety state 10/23/2020   Pure hypercholesterolemia 10/23/2020   Urinary tract infectious disease 10/23/2020   Atherosclerosis of coronary artery 10/23/2020   Sinus node dysfunction (HCC) 10/23/2020   Mixed hyperlipidemia 10/23/2020   OSA on CPAP 10/23/2020   Fall 09/05/2020   Nausea 04/23/2020   Constipation 04/23/2020   Posterior uveitis, 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   Dysphagia 01/21/2020   CHF (congestive heart failure) (HCC) 11/23/2019   Exposure to COVID-19 virus 12/04/2018   Osteoarthritis 09/19/2018   Abscess of left leg 09/26/2017   Left leg cellulitis 09/18/2017   Whiplash 06/10/2017   Cough 05/29/2017   Concussion 05/29/2017   Hammer toes of both feet 12/19/2016   Allergic rhinitis 12/09/2016   Pedal edema 09/17/2016   Chest pain 07/18/2016   Gross hematuria 04/26/2016   Prediabetes 03/21/2016   Dysuria 12/07/2015   Fever blister 12/07/2015   Choroidal nevus, 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   Malignant neoplasm of left female breast (HCC) 10/10/2015   Anxiety and depression 06/30/2015   Encounter for well adult exam with abnormal findings 06/30/2015   Neck mass 03/24/2015   Hypothyroidism 03/24/2015   Gout 03/24/2015   Essential hypertension 03/24/2015   Asthma 03/24/2015   Peripheral neuropathy 03/24/2015  Osteoporosis 03/24/2015   Morbid obesity (HCC) 03/24/2015   Long term current use of anticoagulant therapy 03/16/2015   Lymphadenopathy, cervical 03/16/2015   Diarrhea 03/16/2015   Hyperbilirubinemia 03/16/2015   Coronary arteriosclerosis 09/22/2014   Hyperlipidemia 07/23/2013   Hypokalemia 02/18/2013   Elevated bilirubin 02/18/2013   Morbid obesity with BMI of 45.0-49.9, adult (HCC) 10/29/2012   Rotator cuff tear arthropathy 10/29/2012   DJD (degenerative joint disease) of knee 10/29/2012   Plantar fasciitis    Arthritis    Hx of radiation therapy    Atrial fibrillation, chronic  (HCC) 01/21/2011   Malignant neoplasm of upper-outer quadrant of left breast in female, estrogen receptor positive (HCC) 12/26/2010   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      Home Medications    Prior to Admission medications   Medication Sig Start Date End Date Taking? Authorizing Provider  mupirocin ointment (BACTROBAN) 2 % Apply 1 Application topically 2 (two) times daily. 03/15/23  Yes Rinaldo Ratel, [place] N, FNP  albuterol (PROVENTIL) (2.5 MG/3ML) 0.083% nebulizer solution Take 3 mLs (2.5 mg total) by nebulization every 6 (six) hours as needed for wheezing or shortness of breath. 10/12/21   Cobb, Ruby Cola, NP  allopurinol (ZYLOPRIM) 300 MG tablet Take 300 mg by mouth daily.    [provider]  ALPRAZolam Prudy Feeler) 0.5 MG tablet TAKE 1 TO 2 TABLETS BY MOUTH TWICE DAILY AS NEEDED FOR ANXIETY OR SLEEP 11/25/22   [name], MD  AMBULATORY NON FORMULARY MEDICATION Medication Name: Prednisone Injection in knee    [provider]  azelastine (ASTELIN) 0.1 % nasal spray Place 2 sprays into both nostrils daily. Use in each nostril as directed Patient taking differently: Place 2 sprays into both nostrils in the morning. 07/14/19   [name], MD  bisacodyl (DULCOLAX) 10 MG suppository Place 1 suppository (10 mg total) rectally as needed for moderate constipation. 12/09/22   Rodolph Bong, MD  budesonide-formoterol (SYMBICORT) 80-4.5 MCG/ACT inhaler 2 puffs Inhalation Twice a day    [provider]  co-enzyme Q-10 50 MG capsule Take 100 mg by mouth daily.    [provider]  colchicine 0.6 MG tablet Take 0.6 mg by mouth daily as needed (flare  up).    [provider]  cycloSPORINE (RESTASIS) 0.05 % ophthalmic emulsion Apply 1 drop to eye 2 (two) times daily as needed (allergies).    [provider]  EPINEPHrine 0.3 mg/0.3 mL IJ SOAJ injection Inject 0.3 mg into the muscle as needed for anaphylaxis. 12/29/19   [provider]  Evolocumab with Infusor (REPATHA PUSHTRONEX SYSTEM) 420 MG/3.5ML SOCT Inject 420 mg into the skin every 30 (thirty) days. 02/04/23   Corky Crafts, MD  ezetimibe (ZETIA) 10 MG tablet Take 1 tablet (10 mg total) by mouth daily. Take on Tues & Fri 07/24/22   [name], MD  fexofenadine (ALLERGY  RELIEF) 180 MG tablet Take 1 tablet (180 mg total) by mouth daily. 02/12/22   Leslye Peer, MD  fluticasone (FLONASE) 50 MCG/ACT nasal spray Place 1 spray into both nostrils every morning.    [provider]  fluticasone-salmeterol (ADVAIR DISKUS) 500-50 MCG/ACT AEPB 1 puff Inhalation Twice a day    [provider]  fluticasone-salmeterol (ADVAIR) 500-50 MCG/ACT AEPB Inhale 1 puff into the lungs in the morning and at bedtime.    [provider]  gabapentin (NEURONTIN) 300 MG capsule Take 1 capsule (300 mg total) by mouth at bedtime. 11/25/22   [name], MD  Homeopathic Products (ARNICARE) GEL Apply 1 application  topically daily as needed (soreness).    [provider]  hydrochlorothiazide (HYDRODIURIL) 25 MG tablet TAKE 2 TABLETS(50 MG) BY MOUTH DAILY 10/16/22   [name], MD  levalbuterol [facility] HFA) 45 MCG/ACT inhaler Inhale 2 puffs into the lungs every 6 (six) hours as needed for wheezing or shortness of breath. 09/30/19   [name], MD  levocetirizine (XYZAL) 5 MG tablet Take 1 tablet (5 mg total) by mouth every evening. 06/24/19   [name], MD  levothyroxine  (SYNTHROID) 75 MCG tablet Take 1 tablet (75 mcg total) by mouth daily before breakfast. 01/10/23   [name], MD  metoprolol succinate (TOPROL-XL) 25 MG 24 hr tablet TAKE 1/2 TABLET BY MOUTH EVERY DAY 01/14/23   Corky Crafts, MD  mirtazapine (REMERON) 15 MG tablet Take 1 tablet (15 mg total) by mouth daily. 02/11/23   Lynann Bologna, MD  montelukast (SINGULAIR) 10 MG tablet Take 1 tablet (10 mg total) by mouth at bedtime. 07/01/21   Pokhrel, Rebekah [place], MD  ondansetron (ZOFRAN) 4 MG tablet Take 1 tablet (4 mg total) by mouth every 8 (eight) hours as needed for nausea or vomiting. 06/07/22   [name], MD  pantoprazole (PROTONIX) 40 MG tablet Take 1 tablet (40 mg total) by mouth daily. 02/11/23   Lynann Bologna, MD  potassium chloride SA (KLOR-CON M) 20 MEQ tablet TAKE 1 TABLET BY MOUTH DAILY 07/08/22   [name], MD  rosuvastatin (CRESTOR) 10 MG tablet 1 tablet Orally Twice a week    [provider]  traMADol (ULTRAM) 50 MG tablet Take 0.5-1 tablets (25-50 mg total) by mouth every 6 (six) hours as needed. 11/25/22   [name], MD  triamcinolone cream (KENALOG) 0.1 % Apply 1 Application topically 2 (two) times daily. 12/05/22   Rodolph Bong, MD  valACYclovir (VALTREX) 1000 MG tablet Take 2,000 mg by mouth 2 (two) times daily. 10/22/22   [provider]  XARELTO 20 MG TABS tablet TAKE 1 TABLET(20 MG) BY MOUTH DAILY WITH SUPPER 03/12/22   Corky Crafts, MD    Family History Family History  Problem Relation Age of Onset   Heart disease Father    Heart attack Father    Rectal cancer Father    Asthma Sister    Heart disease Brother    Heart attack Brother    Stomach cancer Neg Hx    Colon cancer Neg Hx     Social History Social History   Tobacco Use   Smoking status: Former    Current packs/day: 0.00    Average packs/day: 1 pack/day for 4.0 years (4.0 ttl pk-yrs)    Types: Cigarettes    Start date: 12/05/1958    Quit date: 12/05/1962    Years since quitting:  60.3  Smokeless tobacco: Never  Vaping Use   Vaping status: Never Used  Substance Use Topics   Alcohol use: No   Drug use: No     Allergies   Crestor [rosuvastatin calcium], Hydrocodone-acetaminophen, Lidocaine hcl, Lipitor [atorvastatin calcium], Procaine, Rosuvastatin, Theophylline, Vicodin [hydrocodone-acetaminophen], Codeine, Ephedrine, Other, Oxycodone, Oxycodone-acetaminophen, Propoxyphene, Quinine derivatives, Sulfa antibiotics, Epinephrine, Penicillins, Quinine, and Theophyllines   Review of Systems Review of Systems  Per HPI   Physical Exam Triage Vital Signs ED Triage Vitals  Encounter Vitals Group     BP 03/15/23 1654 113/63     Systolic BP Percentile --      Diastolic BP Percentile --      Pulse Rate 03/15/23 1654 75     Resp 03/15/23 1654 18     Temp 03/15/23 1654 98.1 F (36.7 C)     Temp Source 03/15/23 1654 Oral     SpO2 03/15/23 1654 98 %     Weight 03/15/23 1657 218 lb (98.9 kg)     Height --      Head Circumference --      Peak Flow --      Pain Score 03/15/23 1657 0     Pain Loc --      Pain Education --      Exclude from Growth Chart --    No data found.  Updated Vital Signs BP 113/63 (BP Location: Right Arm)   Pulse 75   Temp 98.1 F (36.7 C) (Oral)   Resp 18   Wt 218 lb (98.9 kg)   SpO2 98%   BMI 37.42 kg/m   Visual Acuity Right Eye Distance:   Left Eye Distance:   Bilateral Distance:    Right Eye Near:   Left Eye Near:    Bilateral Near:     Physical Exam Vitals and nursing note reviewed.  Constitutional:      Appearance: Normal appearance.  HENT:     Head: Normocephalic and atraumatic.     Right Ear: External ear normal.     Left Ear: External ear normal.     Nose: Nose normal.     Mouth/Throat:     Mouth: Mucous membranes are moist.  Eyes:     Conjunctiva/sclera: Conjunctivae normal.  Cardiovascular:     Rate and Rhythm: Normal rate.  Pulmonary:     Effort: Pulmonary effort is normal. No respiratory distress.   Musculoskeletal:        General: Normal range of motion.  Skin:    General: Skin is warm and dry.     Findings: Abrasion present.     Comments: Skin flap to your to right distal forearm.  Wound is not deep enough for sutures, would not hold well with her thin skin.  Neurological:     General: No focal deficit present.     Mental Status: She is alert.  Psychiatric:        Mood and Affect: Mood normal.        Behavior: Behavior is cooperative.      UC Treatments / Results  Labs (all labs ordered are listed, but only abnormal results are displayed) Labs Reviewed - No data to display  EKG   Radiology No results found.  Procedures Procedures (including critical care time)  Medications Ordered in UC Medications  Tdap (BOOSTRIX) injection 0.5 mL (0.5 mLs Intramuscular Given 03/15/23 1743)    Initial Impression / Assessment and Plan / UC Course  I have reviewed the triage vital signs  and the nursing notes.  Pertinent labs & imaging results that were available during my care of the patient were reviewed by me and considered in my medical decision making (see chart for details).  Vitals and triage reviewed, patient is hemodynamically stable.  Flap shaped skin tear to right forearm is not appropriate for suturing due to shallow nature of the wound and her thin skin.  Wound cleaned with wound spray and saline.  Wound care provided in clinic and discussed with patient.  Tdap updated in clinic.  Plan of care, follow-up care return precautions given, no questions at this time.     Final Clinical Impressions(s) / UC Diagnoses   Final diagnoses:  Skin tear of forearm without complication, initial encounter     Discharge Instructions      We have updated your Tdap today in clinic.  You have a skin tear of your right forearm that is too superficial to suture.  We have cleaned it today in clinic.  Please apply the topical antibacterial ointment twice daily and keep the area clean  and dry.  This should gradually heal over the next few weeks.  Please return to clinic if you develop any warmth, purulent drainage, fever, streaking, or any signs concerning of infection.      ED Prescriptions     Medication Sig Dispense Auth. Provider   mupirocin ointment (BACTROBAN) 2 % Apply 1 Application topically 2 (two) times daily. 22 g Aswad Wandrey, [place] N, [place]      PDMP not reviewed this encounter.   Annalie Wenner, [place] N, [place] 03/15/23 [phone]

## 2023-03-15 NOTE — Discharge Instructions (Signed)
We have updated your Tdap today in clinic.  You have a skin tear of your right forearm that is too superficial to suture.  We have cleaned it today in clinic.  Please apply the topical antibacterial ointment twice daily and keep the area clean and dry.  This should gradually heal over the next few weeks.  Please return to clinic if you develop any warmth, purulent drainage, fever, streaking, or any signs concerning of infection.

## 2023-03-19 ENCOUNTER — Ambulatory Visit: Payer: Medicare Other | Admitting: Interventional Cardiology

## 2023-03-24 ENCOUNTER — Other Ambulatory Visit: Payer: Self-pay | Admitting: Gastroenterology

## 2023-03-24 ENCOUNTER — Other Ambulatory Visit: Payer: Self-pay | Admitting: Emergency Medicine

## 2023-03-31 ENCOUNTER — Ambulatory Visit: Payer: Medicare Other | Admitting: Internal Medicine

## 2023-03-31 NOTE — Progress Notes (Deleted)
Cardiology Office Note:  .   Date:  03/31/2023  ID:  [name], DOB 09/12/1935, MRN [id number] PCP: [name], MD  [facility] Providers Cardiologist:  Lance Muss, MD { Click to update primary MD,subspecialty MD or APP then REFRESH:1}   Patient Profile: .      PMH Persistent atrial fibrillation Coronary artery disease Cardiac cath 2001 mD1 50 LCx 20-25 ISR of previously placed RCA stent (7 months prior) Nuclear stress test 01/2011 -small partially reversible defect anterior lateral area, EF 67 Possible breast attenuation, no intervention Hyperlipidemia Morbid obesity Breast cancer Hypertension Gout OSA Hypothyroidism Bradydcardia LE edema  Noted to be bradycardic in the 40s 03/2017 so atenolol was stopped.  After stopping atenolol she felt her heart pounding faster with activity so low-dose metoprolol 12.5 mg twice daily was added back.  She had an abscess on her left chin and was told problem was vascular. Gets cellulitis at times in that area treated with antibiotics.   In 2020 Zetia was discontinued and she was continued  on Repatha alone.  Vascular congestion noted on CXR July 2021.  Echo showed LVEF 60 to 65%, no RWMA, indeterminate diastolic parameters, elevated LVEDP, moderately elevated PASP, severely dilated LA, mildly dilated RA, no significant valve disease.  Her husband passed away Christmas 2022.  Admission 06/2021 with pneumonia with elevated troponin downward trending, suspect demand ischemia.  No significant change from previous echo.  Last cardiology clinic visit was with me on 10/02/2022.  She was ambulating with a rolling walker.  Reporting feeling well.  She was living independently with her daughters close by and continued to speak frequently at her church.  She did not have any concerning cardiac symptoms.  LDL had increased. It is unclear which lipid lowering therapies she was on at that time.        History of Present Illness: .   [name] is a *** 87 y.o. female ***   Discussed the use of AI scribe software for clinical note transcription with the patient, who gave verbal consent to proceed.   ROS: ***       Studies Reviewed: .        *** Risk Assessment/Calculations:    CHA2DS2-VASc Score = 5  {Confirm score is correct.  If not, click here to update score.  REFRESH note.  :1} This indicates a 7.2% annual risk of stroke. The patient's score is based upon: CHF History: 0 HTN History: 1 Diabetes History: 0 Stroke History: 0 Vascular Disease History: 1 Age Score: 2 Gender Score: 1   {This patient has a significant risk of stroke if diagnosed with atrial fibrillation.  Please consider VKA or DOAC agent for anticoagulation if the bleeding risk is acceptable.   You can also use the SmartPhrase .[email or web address] for documentation.   :[id number]} No BP recorded.  {Refresh Note OR Click here to enter BP  :1}***       Physical Exam:   VS:  There were no vitals taken for this visit.   Wt Readings from Last 3 Encounters:  03/15/23 218 lb (98.9 kg)  02/11/23 221 lb (100.2 kg)  12/31/22 230 lb (104.3 kg)    GEN: Well nourished, well developed in no acute distress NECK: No JVD; No carotid bruits CARDIAC: ***RRR, no murmurs, rubs, gallops RESPIRATORY:  Clear to auscultation without rales, wheezing or rhonchi  ABDOMEN: Soft, non-tender, non-distended EXTREMITIES:  No edema; No deformity     ASSESSMENT AND PLAN: .  CAD: S/p RCA stent 2000 with residual mild to moderate disease of left system on cath 2001.  Hyperlipidemia LDL goal < 55: Hypertension: Persistent atrial fibrillation on chronic anticoagulation:    {Are you ordering a CV Procedure (e.g. stress test, cath, DCCV, TEE, etc)?   Press F2        :[id number]}  Dispo: ***  Signed, Eligha Bridegroom, NP-C

## 2023-04-01 ENCOUNTER — Ambulatory Visit: Payer: Medicare Other | Admitting: Nurse Practitioner

## 2023-04-01 DIAGNOSIS — L821 Other seborrheic keratosis: Secondary | ICD-10-CM | POA: Diagnosis not present

## 2023-04-01 DIAGNOSIS — L570 Actinic keratosis: Secondary | ICD-10-CM | POA: Diagnosis not present

## 2023-04-01 DIAGNOSIS — L814 Other melanin hyperpigmentation: Secondary | ICD-10-CM | POA: Diagnosis not present

## 2023-04-01 DIAGNOSIS — D225 Melanocytic nevi of trunk: Secondary | ICD-10-CM | POA: Diagnosis not present

## 2023-04-08 ENCOUNTER — Ambulatory Visit: Payer: Medicare Other | Admitting: Family Medicine

## 2023-04-08 VITALS — BP 126/82 | HR 79 | Ht 65.0 in | Wt 220.0 lb

## 2023-04-08 DIAGNOSIS — R42 Dizziness and giddiness: Secondary | ICD-10-CM

## 2023-04-08 DIAGNOSIS — G4701 Insomnia due to medical condition: Secondary | ICD-10-CM | POA: Diagnosis not present

## 2023-04-08 DIAGNOSIS — M542 Cervicalgia: Secondary | ICD-10-CM

## 2023-04-08 MED ORDER — MIRTAZAPINE 15 MG PO TABS: 15.0000 mg | ORAL_TABLET | Freq: Every day | ORAL | 3 refills | Status: DC

## 2023-04-08 NOTE — Patient Instructions (Addendum)
Thank you for coming in today.   I've referred you to Physical Therapy.  Let us know if you don't hear from them in one week.   Let me know if this is not working.   Look up that mirtazapine.

## 2023-04-08 NOTE — Progress Notes (Signed)
Mercedes Payor, PhD, LAT, ATC acting as a scribe for Mercedes Graham, MD.  [name] is a 87 y.o. female who presents to [facility] today for f/u chronic neck pain and continued vestibular concussion symptoms. Pt was last seen by Dr. [name] on 12/31/22 and a cervical and brain MRI's were ordered. Based on findings, she was referred to neurosurgery.She was d/c from vestibular therapy on 02/19/23, after completing 21 visits.  Today, pt reports she has been seen by neurosurgery in Nov, who was satisfied w/ the healed compression fx. She c/o cont'd fogginess, trouble word finding, and neck/trapz pain.   Dx imaging: 01/17/23 C-spine & brain MRI  10/15/22 Head & C-spine CT  Pertinent review of systems: No fevers or chills  Relevant historical information: Hypertension.   Exam:  BP 126/82   Pulse 79   Ht 5\' 5"  (1.651 m)   Wt 220 lb (99.8 kg)   SpO2 99%   BMI 36.61 kg/m  General: Well Developed, well nourished, and in no acute distress.   MSK: C-spine: Continued neck muscle spasm right anterior to lateral cervical spine.  Decreased cervical motion.  Neuropsych: Balance impaired using a walker to ambulate.    Lab and Radiology Results  EXAM: MRI HEAD WITHOUT CONTRAST   MRI CERVICAL SPINE WITHOUT CONTRAST   TECHNIQUE: Multiplanar, multiecho pulse sequences of the brain and surrounding structures, and cervical spine, to include the craniocervical junction and cervicothoracic junction, were obtained without intravenous contrast.   COMPARISON:  None Available.   FINDINGS: MRI HEAD FINDINGS   Brain: No acute infarction, hemorrhage, hydrocephalus, extra-axial collection or mass lesion. Patchy T2/FLAIR hyperintensities in the white matter are nonspecific but compatible with chronic microvascular ischemic disease.   Vascular: Major arterial flow voids are maintained at the skull base.   Skull and upper cervical spine: Normal marrow signal.    Sinuses/Orbits: Clear sinuses.  No acute orbital findings.   Other: No mastoid effusions.   MRI CERVICAL SPINE FINDINGS   Alignment: Straightening.  No substantial sagittal subluxation.   Vertebrae: Acute or subacute nondisplaced fracture through the dens with associated marrow edema.   Cord: Normal cord signal.   Posterior Fossa, vertebral arteries, paraspinal tissues: Visualized vertebral artery flow voids are maintained. No evidence of acute abnormality in the visualized posterior fossa.   Disc levels:   C2-C3: Right facet and uncovertebral hypertrophy. Moderate right foraminal stenosis. Patent canal and left foramen.   C3-C4: Right and left facet and uncovertebral hypertrophy. Resulting moderate right foraminal stenosis. Patent canal and left foramen.   C4-C5: Posterior disc osteophyte complex with bilateral facet uncovertebral hypertrophy. Resulting severe bilateral foraminal stenosis. Patent canal.   C5-C6: Posterior disc osteophyte complex with facet arthropathy. Resulting mild canal stenosis. No significant foraminal stenosis.   C6-C7: Partial bony fusion. Bilateral uncovertebral hypertrophy. Resulting moderate bilateral foraminal stenosis. Mild canal stenosis.   C7-T1: No significant disc protrusion, foraminal stenosis, or canal stenosis.   IMPRESSION: MRI cervical spine:   1. Acute or subacute nondisplaced fracture through the dens. 2. At C4-C5, severe bilateral foraminal stenosis. Multilevel moderate foraminal stenosis is detailed above.   These results will be called to the ordering clinician or representative by the Radiologist Assistant, and communication documented in the PACS or [employer].   MRI head:   No evidence of acute intracranial abnormality.     Electronically Signed   By: Feliberto Harts M.D.   On: 01/31/2023 13:07     I,  [name], personally (independently) visualized and performed the interpretation of the images  attached in this note.      Assessment and Plan: 87 y.o. female with continued neck pain following a significant fall.  Initial CT scan in June did not show any fractures but follow-up MRI of the cervical spine in September did show a surprising fracture of the dens.  Fortunately it had healed by the time she got to neurosurgery.  She did not have any significant compromise of her spinal cord as well.  Today her big problem is continued muscle spasm on the right side of her cervical spine.  Retrial of PT should be helpful for this.  Additionally she notes some difficulty sleeping.  It looks like her gastroenterologist tried to prescribe her mirtazapine to improve her appetite back and October but this never was picked up.  Will go ahead and prescribe it again now as that should help bedtime insomnia.    PDMP not reviewed this encounter. Orders Placed This Encounter  Procedures   Ambulatory referral to Physical Therapy    Referral Priority:   Routine    Referral Type:   Physical Medicine    Referral Reason:   Specialty Services Required    Requested Specialty:   Physical Therapy    Number of Visits Requested:   1   Meds ordered this encounter  Medications   mirtazapine (REMERON) 15 MG tablet    Sig: Take 1 tablet (15 mg total) by mouth daily.    Dispense:  90 tablet    Refill:  3     Discussed warning signs or symptoms. Please see discharge instructions. Patient expresses understanding.   The above documentation has been reviewed and is accurate and complete [name], M.D.

## 2023-04-17 ENCOUNTER — Encounter: Payer: Self-pay | Admitting: Internal Medicine

## 2023-04-17 ENCOUNTER — Ambulatory Visit (INDEPENDENT_AMBULATORY_CARE_PROVIDER_SITE_OTHER): Payer: Medicare Other | Admitting: Internal Medicine

## 2023-04-17 VITALS — BP 140/80 | HR 97 | Temp 98.1°F | Ht 65.0 in | Wt 218.0 lb

## 2023-04-17 DIAGNOSIS — F419 Anxiety disorder, unspecified: Secondary | ICD-10-CM | POA: Diagnosis not present

## 2023-04-17 DIAGNOSIS — E559 Vitamin D deficiency, unspecified: Secondary | ICD-10-CM | POA: Diagnosis not present

## 2023-04-17 DIAGNOSIS — R21 Rash and other nonspecific skin eruption: Secondary | ICD-10-CM | POA: Diagnosis not present

## 2023-04-17 DIAGNOSIS — F32A Depression, unspecified: Secondary | ICD-10-CM

## 2023-04-17 DIAGNOSIS — I10 Essential (primary) hypertension: Secondary | ICD-10-CM

## 2023-04-17 DIAGNOSIS — R7303 Prediabetes: Secondary | ICD-10-CM

## 2023-04-17 MED ORDER — TRIAMCINOLONE ACETONIDE 0.1 % EX CREA: 1.0000 | TOPICAL_CREAM | Freq: Two times a day (BID) | CUTANEOUS | 1 refills | Status: AC

## 2023-04-17 MED ORDER — CITALOPRAM HYDROBROMIDE 10 MG PO TABS: 10.0000 mg | ORAL_TABLET | Freq: Every day | ORAL | 3 refills | Status: DC

## 2023-04-17 NOTE — Patient Instructions (Signed)
Please take all new medication as prescribed - the cream, but also change the remeron back to the citalopram 10 mg per day  Please continue all other medications as before, and refills have been done if requested.  Please have the pharmacy call with any other refills you may need.  Please continue your efforts at being more active, low cholesterol diet, and weight control  Please keep your appointments with your specialists as you may have planned  Please make an Appointment to return in 6 months, or sooner if needed

## 2023-04-17 NOTE — Progress Notes (Signed)
Patient ID: [name], female   DOB: [birth year], 87 y.o.   MRN: [id number]        Chief Complaint: follow up HTN, HLD and hyperglycemia , low vit d, rash       HPI:  [name] is a 87 y.o. female here after seen per Dr Mikal Plane NS with dens cspine fx after falling on floor, now improved, still with pain but improved.  Follows also with OSA provider.  Pt denies chest pain, increased sob or doe, wheezing, orthopnea, PND, increased LE swelling, palpitations, dizziness or syncope.   Pt denies polydipsia, polyuria, or new focal neuro s/s.   Does have mild itchy rash to arms Did also have some increased weight with remeron so asking for change in tx.  Denies worsening depressive symptoms, suicidal ideation, or panic; has ongoing anxiety   Wt Readings from Last 3 Encounters:  04/18/23 215 lb 3.2 oz (97.6 kg)  04/17/23 218 lb (98.9 kg)  04/08/23 220 lb (99.8 kg)   BP Readings from Last 3 Encounters:  04/18/23 111/74  04/17/23 (!) 140/80  04/08/23 126/82         Past Medical History:  Diagnosis Date   Allergy    Anxiety    Arthritis    no cartlidge in knees   Asthma    Bradycardia    a. atenolol stopped due to this, HR 40s.   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  CAD s/p RCA stent 2000 with residual mild-mod disease of left system 2001.   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 Her smoking use included cigarettes. She started smoking about 64 years ago. She has a 4 pack-year smoking history. She has never used smokeless tobacco. She reports that she does not drink alcohol and does not use drugs. family history includes Asthma in her sister; Heart attack in her brother and father; Heart disease in her brother and father; Rectal cancer in her father. Allergies  Allergen Reactions   Crestor [Rosuvastatin Calcium]     Severe liver function problems   Hydrocodone-Acetaminophen Anxiety and Itching   Lidocaine Hcl Other (See Comments)    Novacaine:  Becomes very shaky   Lipitor [Atorvastatin Calcium] Other (See Comments)    Elevated liver function    Procaine Other (See Comments)    shaking   Rosuvastatin Other (See Comments)  Severe liver function problems   Theophylline Other (See Comments)    Becomes very shaky skaking skaking   Vicodin [Hydrocodone-Acetaminophen] Itching    Itching all over   Codeine Nausea Only, Anxiety and Other (See Comments)    Also complained of dizziness.  Has same problems with oxycodone and hydrocodone Visual Disturbance, Balance Difficulty, Dizzy "Room Spinning; "Swimming" Balance, vision, nausea, dizzy, "swimming", "room spinning" Balance, vision, nausea, dizzy, "swimming", "room spinning"   Ephedrine Other (See Comments)    shaking   Other Other (See Comments)    Quinine causes nausea, dizzy   Oxycodone Other (See Comments)    Balance, vision, nausea, dizzy, "swimming", "room spinning"   Oxycodone-Acetaminophen Nausea Only, Anxiety and Other (See Comments)    Visual Disturbance, Balance  Difficulty, Dizzy "Room Spinning; "Swimming"   Propoxyphene Anxiety, Nausea Only and Other (See Comments)    Visual Disturbance, Balance Difficulty, Dizzy "Room Spinning; "Swimming" Balance, vision, nausea, dizzy, "swimming", "room spinning" Balance, vision, nausea, dizzy, "swimming, "room spinning"   Quinine Derivatives Nausea Only    dizzy   Sulfa Antibiotics Nausea Only    Also complained of dizziness   Epinephrine Other (See Comments)    Patient becomes "shaky" shaking shaking   Penicillins Rash    Pt reported all over body rash in 1958 Has patient had a PCN reaction causing immediate rash, facial/tongue/throat swelling, SOB or lightheadedness with hypotension:Yes Has patient had a PCN reaction causing severe rash involving mucus membranes or skin necrosis: No Has patient had a PCN reaction that required hospitalization: No Has patient had a PCN reaction occurring within the last 10 years:No     Quinine Other (See Comments)   Theophyllines Other (See Comments)    Patient becomes "shaky"   Current Outpatient Medications on File Prior to Visit  Medication Sig Dispense Refill   albuterol (PROVENTIL) (2.5 MG/3ML) 0.083% nebulizer solution Take 3 mLs (2.5 mg total) by nebulization every 6 (six) hours as needed for wheezing or shortness of breath. 75 mL 12   ALLERGY RELIEF 180 MG tablet TAKE 1 TABLET(180 MG) BY MOUTH DAILY 90 tablet 5   allopurinol (ZYLOPRIM) 300 MG tablet Take 300 mg by mouth daily.     ALPRAZolam (XANAX) 0.5 MG tablet TAKE 1 TO 2 TABLETS BY MOUTH TWICE DAILY AS NEEDED FOR ANXIETY OR SLEEP 60 tablet 2   AMBULATORY NON FORMULARY MEDICATION Medication Name: Prednisone Injection in knee     azelastine (ASTELIN) 0.1 % nasal spray Place 2 sprays into both nostrils daily. Use in each nostril as directed (Patient taking differently: Place 2 sprays into both nostrils in the morning.) 30 mL 5   bisacodyl (DULCOLAX) 10 MG suppository Place 1 suppository (10 mg total) rectally  as needed for moderate constipation. 12 suppository 0   budesonide-formoterol (SYMBICORT) 80-4.5 MCG/ACT inhaler 2 puffs Inhalation Twice a day     co-enzyme Q-10 50 MG capsule Take 100 mg by mouth daily.     colchicine 0.6 MG tablet Take 0.6 mg by mouth daily as needed (flare  up).     cycloSPORINE (RESTASIS) 0.05 % ophthalmic emulsion Apply 1 drop to eye 2 (two) times daily as needed (allergies).     EPINEPHrine 0.3 mg/0.3 mL IJ SOAJ injection Inject 0.3 mg into the muscle as needed for anaphylaxis.     Evolocumab with Infusor (REPATHA PUSHTRONEX SYSTEM) 420 MG/3.5ML SOCT Inject 420 mg into the skin every 30 (thirty) days. 3.5 mL 0   ezetimibe (ZETIA) 10 MG tablet Take 1  tablet (10 mg total) by mouth daily. Take on Tues & Fri 90 tablet 3   fluticasone (FLONASE) 50 MCG/ACT nasal spray Place 1 spray into both nostrils every morning.     fluticasone-salmeterol (ADVAIR DISKUS) 500-50 MCG/ACT AEPB 1 puff Inhalation Twice a day     fluticasone-salmeterol (ADVAIR) 500-50 MCG/ACT AEPB Inhale 1 puff into the lungs in the morning and at bedtime.     gabapentin (NEURONTIN) 300 MG capsule Take 1 capsule (300 mg total) by mouth at bedtime. 90 capsule 1   Homeopathic Products (ARNICARE) GEL Apply 1 application  topically daily as needed (soreness).     hydrochlorothiazide (HYDRODIURIL) 25 MG tablet TAKE 2 TABLETS(50 MG) BY MOUTH DAILY 180 tablet 1   levalbuterol (XOPENEX HFA) 45 MCG/ACT inhaler Inhale 2 puffs into the lungs every 6 (six) hours as needed for wheezing or shortness of breath. 3 Inhaler 3   levocetirizine (XYZAL) 5 MG tablet Take 1 tablet (5 mg total) by mouth every evening. 30 tablet 11   levothyroxine (SYNTHROID) 75 MCG tablet Take 1 tablet (75 mcg total) by mouth daily before breakfast. 90 tablet 1   metoprolol succinate (TOPROL-XL) 25 MG 24 hr tablet TAKE 1/2 TABLET BY MOUTH EVERY DAY 45 tablet 2   montelukast (SINGULAIR) 10 MG tablet Take 1 tablet (10 mg total) by mouth at bedtime. 90 tablet  1   mupirocin ointment (BACTROBAN) 2 % Apply 1 Application topically 2 (two) times daily. 22 g 0   ondansetron (ZOFRAN) 4 MG tablet Take 1 tablet (4 mg total) by mouth every 8 (eight) hours as needed for nausea or vomiting. 40 tablet 1   pantoprazole (PROTONIX) 40 MG tablet TAKE 1 TABLET(40 MG) BY MOUTH DAILY 30 tablet 2   potassium chloride SA (KLOR-CON M) 20 MEQ tablet TAKE 1 TABLET BY MOUTH DAILY 90 tablet 3   rosuvastatin (CRESTOR) 10 MG tablet 1 tablet Orally Twice a week     traMADol (ULTRAM) 50 MG tablet Take 0.5-1 tablets (25-50 mg total) by mouth every 6 (six) hours as needed. 120 tablet 2   valACYclovir (VALTREX) 1000 MG tablet Take 2,000 mg by mouth 2 (two) times daily.     XARELTO 20 MG TABS tablet TAKE 1 TABLET(20 MG) BY MOUTH DAILY WITH SUPPER 90 tablet 1   No current facility-administered medications on file prior to visit.        ROS:  All others reviewed and negative.  Objective        PE:  BP (!) 140/80 (BP Location: Left Arm, Patient Position: Sitting, Cuff Size: Normal)   Pulse 97   Temp 98.1 F (36.7 C) (Oral)   Ht 5\' 5"  (1.651 m)   Wt 218 lb (98.9 kg)   SpO2 (!) 68%   BMI 36.28 kg/m                 Constitutional: Pt appears in NAD               HENT: Head: NCAT.                Right Ear: External ear normal.                 Left Ear: External ear normal.                Eyes: . Pupils are equal, round, and reactive to light. Conjunctivae and EOM are normal  Nose: without d/c or deformity               Neck: Neck supple. Gross normal ROM               Cardiovascular: Normal rate and regular rhythm.                 Pulmonary/Chest: Effort normal and breath sounds without rales or wheezing.                Abd:  Soft, NT, ND, + BS, no organomegaly               Neurological: Pt is alert. At baseline orientation, motor grossly intact               Skin: Skin is warm. Non tender erythema to several areas bilat arms,, no other new lesions, LE edema -  none               Psychiatric: Pt behavior is normal without agitation , nervous depressed  Micro: none  Cardiac tracings I have personally interpreted today:  none  Pertinent Radiological findings (summarize): none   Lab Results  Component Value Date   WBC 7.2 02/11/2023   HGB 13.7 02/11/2023   HCT 42.4 02/11/2023   PLT 130.0 (L) 02/11/2023   GLUCOSE 112 (H) 02/11/2023   CHOL 198 11/25/2022   TRIG 71.0 11/25/2022   HDL 70.80 11/25/2022   LDLDIRECT 107.0 09/17/2016   LDLCALC 113 (H) 11/25/2022   ALT 13 02/11/2023   AST 21 02/11/2023   NA 140 02/11/2023   K 3.9 02/11/2023   CL 107 02/11/2023   CREATININE 1.05 02/11/2023   BUN 19 02/11/2023   CO2 21 02/11/2023   TSH 2.15 11/25/2022   INR 1.8 (H) 02/11/2023   HGBA1C 5.6 11/25/2022   MICROALBUR 1.7 05/21/2022   Assessment/Plan:  JAKALA CORLETO is a 87 y.o. 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  Vitamin D deficiency Last vitamin D Lab Results  Component Value Date   VD25OH 39.32 11/25/2022   Low, to start oral replacement   Rash Mild, likely contact dermatitis for unclear reason, for   to f/u any worsening symptoms or concerns  Prediabetes Lab Results  Component Value Date   HGBA1C 5.6 11/25/2022   Stable, pt to continue current medical treatment - diet, wt control   Essential hypertension BP Readings from Last 3 Encounters:  04/18/23 111/74  04/17/23 (!)  140/80  04/08/23 126/82   Stable, pt to continue medical treatment hct 50 every day, toprol xl 12.5 qd   Anxiety and depression Mild to mod, for change remeron to celexa 10 qqd, declines referral counseling,  to f/u any worsening symptoms or concerns  Followup: Return in about 6 months (around 10/16/2023).  Oliver Barre, MD 04/19/2023 5:47 PM [facility]

## 2023-04-18 ENCOUNTER — Encounter: Payer: Self-pay | Admitting: Emergency Medicine

## 2023-04-18 ENCOUNTER — Ambulatory Visit: Payer: Medicare Other | Admitting: Emergency Medicine

## 2023-04-18 VITALS — BP 111/74 | HR 86 | Temp 97.1°F | Ht 64.0 in | Wt 215.2 lb

## 2023-04-18 DIAGNOSIS — Z23 Encounter for immunization: Secondary | ICD-10-CM | POA: Diagnosis not present

## 2023-04-18 DIAGNOSIS — J4530 Mild persistent asthma, uncomplicated: Secondary | ICD-10-CM | POA: Diagnosis not present

## 2023-04-18 DIAGNOSIS — J301 Allergic rhinitis due to pollen: Secondary | ICD-10-CM | POA: Diagnosis not present

## 2023-04-18 MED ORDER — IPRATROPIUM BROMIDE 0.03 % NA SOLN: 2.0000 | Freq: Two times a day (BID) | NASAL | 11 refills | Status: AC | PRN

## 2023-04-18 NOTE — Progress Notes (Signed)
   Subjective:    Patient ID: [name], female    DOB: Apr 04, 1936, 87 y.o.   MRN: [id number]  Asthma She complains of cough, shortness of breath and wheezing. Associated symptoms include a fever. Pertinent negatives include no ear pain, headaches, postnasal drip, rhinorrhea, sneezing, sore throat or trouble swallowing. Her past medical history is significant for asthma.   ROV 04/18/2023 --follow-up visit for 87 year old woman with history of mixed obstructive and restrictive disease, likely fixed asthma.  Also with OSA on CPAP, chronic cough in the setting of chronic rhinitis and GERD.  I last saw her in October 2023. She has been managed on  Doing well. No cough. She is having a lot of clear drainage, even on astelin and fluticasone. On allegra, singulair. She is on wixela, minimal to no albuterol use. No flares since last time.    Review of Systems  Constitutional:  Positive for fever and unexpected weight change.  HENT:  Negative for congestion, dental problem, ear pain, nosebleeds, postnasal drip, rhinorrhea, sinus pressure, sneezing, sore throat and trouble swallowing.   Eyes:  Negative for redness and itching.  Respiratory:  Positive for cough, shortness of breath and wheezing. Negative for chest tightness.   Cardiovascular:  Negative for palpitations and leg swelling.  Gastrointestinal:  Positive for nausea. Negative for vomiting.  Genitourinary:  Negative for dysuria.  Musculoskeletal:  Negative for joint swelling.  Skin:  Negative for rash.  Neurological:  Negative for headaches.  Hematological:  Does not bruise/bleed easily.  Psychiatric/Behavioral:  Positive for confusion. Negative for dysphoric mood. The patient is not nervous/anxious.         Objective:   Physical Exam Vitals:   04/18/23 0855  BP: 111/74  Pulse: 86  Temp: (!) 97.1 F (36.2 C)  TempSrc: Oral  SpO2: 98%  Weight: 215 lb 3.2 oz (97.6 kg)  Height: 5\' 4"  (1.626 m)   Gen: Pleasant, Obese woman, in no  distress,  normal affect  ENT: No lesions,  mouth clear,  oropharynx clear, no active nasal drip  Neck: No JVD, improved.  No upper airway noise  Lungs: No use of accessory muscles, no apparent wheeze, no crackles.   Cardiovascular: Irregular, distant  Musculoskeletal: No deformities, no cyanosis or clubbing  Neuro: alert, non focal  Skin: Warm, no lesions or rashes      Assessment & Plan:  Asthma Please continue your Wixela (generic Advair) twice a day.  Rinse and gargle after using. Continue to keep albuterol available to use 2 puffs to be needed for shortness of breath, chest tightness, wheezing. Flu shot today Follow with Dr. Delton Coombes in 12 months or sooner if you have any problems.   Allergic rhinitis Continue your generic Allegra, Singulair, fluticasone nasal spray as you have been taking them Continue your Astelin nasal spray twice a day We will order ipratropium (Atrovent) nasal spray that you can use up to every 12 hours if you need it for persistent drainage on the Astelin    Levy Pupa, MD, PhD 04/18/2023, 12:07 PM [facility] [phone] or if no answer [phone]

## 2023-04-18 NOTE — Assessment & Plan Note (Signed)
Continue your generic Allegra, Singulair, fluticasone nasal spray as you have been taking them Continue your Astelin nasal spray twice a day We will order ipratropium (Atrovent) nasal spray that you can use up to every 12 hours if you need it for persistent drainage on the Astelin

## 2023-04-18 NOTE — Assessment & Plan Note (Signed)
Please continue your Wixela (generic Advair) twice a day.  Rinse and gargle after using. Continue to keep albuterol available to use 2 puffs to be needed for shortness of breath, chest tightness, wheezing. Flu shot today Follow with Dr. Delton Coombes in 12 months or sooner if you have any problems.

## 2023-04-18 NOTE — Patient Instructions (Addendum)
Please continue your Wixela (generic Advair) twice a day.  Rinse and gargle after using. Continue to keep albuterol available to use 2 puffs to be needed for shortness of breath, chest tightness, wheezing. Flu shot today Continue your generic Allegra, Singulair, fluticasone nasal spray as you have been taking them Continue your Astelin nasal spray twice a day We will order ipratropium (Atrovent) nasal spray that you can use up to every 12 hours if you need it for persistent drainage on the Astelin Follow with Dr. Delton Coombes in 12 months or sooner if you have any problems.

## 2023-04-19 ENCOUNTER — Encounter: Payer: Self-pay | Admitting: Internal Medicine

## 2023-04-19 DIAGNOSIS — R21 Rash and other nonspecific skin eruption: Secondary | ICD-10-CM | POA: Insufficient documentation

## 2023-04-19 NOTE — Assessment & Plan Note (Signed)
Mild to mod, for change remeron to celexa 10 qqd, declines referral counseling,  to f/u any worsening symptoms or concerns

## 2023-04-19 NOTE — Assessment & Plan Note (Signed)
Mild, likely contact dermatitis for unclear reason, for   to f/u any worsening symptoms or concerns

## 2023-04-19 NOTE — Assessment & Plan Note (Signed)
Last vitamin D Lab Results  Component Value Date   VD25OH 39.32 11/25/2022   Low, to start oral replacement

## 2023-04-19 NOTE — Assessment & Plan Note (Signed)
 Lab Results  Component Value Date   HGBA1C 5.6 11/25/2022   Stable, pt to continue current medical treatment  - diet, wt control

## 2023-04-19 NOTE — Assessment & Plan Note (Signed)
BP Readings from Last 3 Encounters:  04/18/23 111/74  04/17/23 (!) 140/80  04/08/23 126/82   Stable, pt to continue medical treatment hct 50 every day, toprol xl 12.5 qd

## 2023-04-22 ENCOUNTER — Other Ambulatory Visit: Payer: Self-pay | Admitting: Interventional Cardiology

## 2023-04-22 DIAGNOSIS — E782 Mixed hyperlipidemia: Secondary | ICD-10-CM

## 2023-04-22 DIAGNOSIS — I2510 Atherosclerotic heart disease of native coronary artery without angina pectoris: Secondary | ICD-10-CM

## 2023-04-22 NOTE — Telephone Encounter (Signed)
Prescription refill request for Xarelto received.  Indication: AF Last office visit: 10/02/22  Mercedes Sprague NP Weight: 103.3kg Age: 87 Scr: 1.05 on 02/11/23  Epic CrCl: 63.88  Based on above findings Xarelto 20mg  daily is the appropriate dose.  Refill approved.

## 2023-05-06 ENCOUNTER — Ambulatory Visit (INDEPENDENT_AMBULATORY_CARE_PROVIDER_SITE_OTHER): Payer: Medicare Other

## 2023-05-06 ENCOUNTER — Telehealth (HOSPITAL_COMMUNITY): Payer: Self-pay

## 2023-05-06 ENCOUNTER — Encounter (HOSPITAL_COMMUNITY): Payer: Self-pay

## 2023-05-06 ENCOUNTER — Ambulatory Visit (HOSPITAL_COMMUNITY)
Admission: RE | Admit: 2023-05-06 | Discharge: 2023-05-06 | Disposition: A | Discharge status: Home / Self Care | Payer: Medicare Other | Source: Ambulatory Visit | Attending: Family Medicine | Admitting: Family Medicine

## 2023-05-06 VITALS — BP 90/60 | HR 86 | Temp 98.8°F | Resp 16

## 2023-05-06 DIAGNOSIS — R0602 Shortness of breath: Secondary | ICD-10-CM

## 2023-05-06 DIAGNOSIS — J4521 Mild intermittent asthma with (acute) exacerbation: Secondary | ICD-10-CM

## 2023-05-06 DIAGNOSIS — J069 Acute upper respiratory infection, unspecified: Secondary | ICD-10-CM

## 2023-05-06 DIAGNOSIS — R0989 Other specified symptoms and signs involving the circulatory and respiratory systems: Secondary | ICD-10-CM | POA: Diagnosis not present

## 2023-05-06 DIAGNOSIS — R059 Cough, unspecified: Secondary | ICD-10-CM | POA: Diagnosis not present

## 2023-05-06 LAB — POC COVID19/FLU A&B COMBO
Covid Antigen, POC: NEGATIVE
Influenza A Antigen, POC: NEGATIVE
Influenza B Antigen, POC: NEGATIVE

## 2023-05-06 MED ORDER — PREDNISONE 20 MG PO TABS: 40.0000 mg | ORAL_TABLET | Freq: Every day | ORAL | 0 refills | Status: DC

## 2023-05-06 MED ORDER — BENZONATATE 100 MG PO CAPS: 100.0000 mg | ORAL_CAPSULE | Freq: Three times a day (TID) | ORAL | 0 refills | Status: DC | PRN

## 2023-05-06 MED ORDER — ALBUTEROL SULFATE HFA 108 (90 BASE) MCG/ACT IN AERS: 2.0000 | INHALATION_SPRAY | RESPIRATORY_TRACT | 0 refills | Status: AC | PRN

## 2023-05-06 MED ORDER — ALBUTEROL SULFATE HFA 108 (90 BASE) MCG/ACT IN AERS
2.0000 | INHALATION_SPRAY | RESPIRATORY_TRACT | 0 refills | Status: DC | PRN
Start: 2023-05-06 — End: 2023-05-06

## 2023-05-06 MED ORDER — PREDNISONE 20 MG PO TABS: 40.0000 mg | ORAL_TABLET | Freq: Every day | ORAL | 0 refills | Status: AC

## 2023-05-06 NOTE — ED Provider Notes (Signed)
 [facility]    CSN: [id number] Arrival date & time: 05/06/23  1804      History   Chief Complaint Chief Complaint  Patient presents with   Cough    HPI [name] is a 87 y.o. female.    Cough Here for cough and nasal congestion and shortness of breath.  Symptoms began in the evening of December 29.  She has not had any fever and no vomiting or diarrhea, but her appetite is decreased.  She tells me she is have asthma but it is unclear if she has ever been prescribed an inhaler  She is on Xarelto   She has had pneumonia in the past  No known exposures  Past Medical History:  Diagnosis Date   Allergy     Anxiety    Arthritis    no cartlidge in knees   Asthma    Bradycardia    a. atenolol  stopped due to this, HR 40s.   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  CAD s/p RCA stent 2000 with residual mild-mod disease of left system 2001.   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   Osteoporosis 03/24/2015   Peripheral neuropathy 03/24/2015   Plantar fasciitis    Sleep apnea     Patient Active Problem List   Diagnosis Date Noted   Rash 04/19/2023   Acute renal failure superimposed on chronic kidney disease (HCC) 12/16/2022   Cloudy urine 11/25/2022   Insomnia 07/27/2022   Vitamin D  deficiency 07/27/2022   Pulmonary hypertension (HCC) 11/07/2021   Bronchiectasis without complication (HCC) 11/07/2021   Hepatic cirrhosis (HCC) 11/06/2021   ILD (interstitial lung disease) (HCC) 10/03/2021   Abnormal LFTs 09/28/2021   Weight loss 08/19/2021   Chronic respiratory failure with hypoxia (HCC)  08/19/2021   Debility 06/29/2021   Elevated troponin 06/12/2021   Febrile illness 05/22/2021   Anxiety state 10/23/2020   Pure hypercholesterolemia 10/23/2020   Urinary tract infectious disease 10/23/2020   Atherosclerosis of coronary artery 10/23/2020   Sinus node dysfunction (HCC) 10/23/2020   Mixed hyperlipidemia 10/23/2020   OSA on CPAP 10/23/2020   Fall 09/05/2020   Nausea 04/23/2020   Constipation 04/23/2020   Posterior uveitis, 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   Dysphagia 01/21/2020   CHF (congestive heart failure) (HCC) 11/23/2019   Exposure to COVID-19 virus 12/04/2018   Osteoarthritis 09/19/2018   Abscess of left leg 09/26/2017   Left leg cellulitis 09/18/2017   Whiplash 06/10/2017   Cough 05/29/2017   Concussion 05/29/2017   Hammer toes of both feet 12/19/2016   Allergic rhinitis 12/09/2016   Pedal edema 09/17/2016   Chest pain 07/18/2016   Gross hematuria 04/26/2016   Prediabetes 03/21/2016   Dysuria 12/07/2015   Fever blister 12/07/2015   Choroidal nevus, 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   Malignant neoplasm of left female breast (HCC) 10/10/2015   Anxiety and depression 06/30/2015   Encounter for well adult exam with abnormal findings 06/30/2015   Neck mass 03/24/2015   Hypothyroidism 03/24/2015   Gout 03/24/2015   Essential hypertension 03/24/2015   Asthma 03/24/2015  Peripheral neuropathy 03/24/2015   Osteoporosis 03/24/2015   Morbid obesity (HCC) 03/24/2015   Long term current use of anticoagulant therapy 03/16/2015   Lymphadenopathy, cervical 03/16/2015   Diarrhea 03/16/2015   Hyperbilirubinemia 03/16/2015   Coronary arteriosclerosis 09/22/2014   Hyperlipidemia 07/23/2013   Hypokalemia 02/18/2013   Elevated bilirubin 02/18/2013   Morbid obesity with BMI of 45.0-49.9, adult (HCC) 10/29/2012   Rotator cuff tear arthropathy 10/29/2012   DJD (degenerative joint disease) of knee 10/29/2012   Plantar fasciitis    Arthritis    Hx of radiation therapy    Atrial  fibrillation, chronic (HCC) 01/21/2011   Malignant neoplasm of upper-outer quadrant of left breast in female, 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      Home Medications    Prior to Admission medications   Medication Sig Start Date End Date Taking? Authorizing Provider  albuterol  (VENTOLIN  HFA) 108 (90 Base) MCG/ACT inhaler Inhale 2 puffs into the lungs every 4 (four) hours as needed for wheezing or shortness of breath. 05/06/23  Yes Vonna Sharlet POUR, MD  benzonatate  (TESSALON ) 100 MG capsule Take 1 capsule (100 mg total) by mouth 3 (three) times daily as needed for cough. 05/06/23  Yes Vonna Sharlet POUR, MD  predniSONE  (DELTASONE ) 20 MG tablet Take 2 tablets (40 mg total) by mouth daily with breakfast for 5 days. 05/06/23 05/11/23 Yes Vonna Sharlet POUR, MD  albuterol  (PROVENTIL ) (2.5 MG/3ML) 0.083% nebulizer solution Take 3 mLs (2.5 mg total) by nebulization every 6 (six) hours as needed for wheezing or shortness of breath. 10/12/21   Cobb, Comer GAILS, NP  ALLERGY  RELIEF 180 MG tablet TAKE 1 TABLET(180 MG) BY MOUTH DAILY 03/24/23   Shelah Lamar RAMAN, MD  allopurinol  (ZYLOPRIM ) 300 MG tablet Take 300 mg by mouth daily.    [provider]  ALPRAZolam  (XANAX ) 0.5 MG tablet TAKE 1 TO 2 TABLETS BY MOUTH TWICE DAILY AS NEEDED FOR ANXIETY OR SLEEP 11/25/22   Norleen Lynwood ORN, MD  AMBULATORY NON FORMULARY MEDICATION Medication Name: Prednisone  Injection in knee     [provider]  azelastine  (ASTELIN ) 0.1 % nasal spray Place 2 sprays into both nostrils daily. Use in each nostril as directed Patient taking differently: Place 2 sprays into both nostrils in the morning. 07/14/19   Norleen Lynwood ORN, MD  bisacodyl  (DULCOLAX) 10 MG suppository Place 1 suppository (10 mg total) rectally as needed for moderate constipation. 12/09/22   Corey, Evan S, MD  budesonide -formoterol  (SYMBICORT ) 80-4.5 MCG/ACT inhaler 2 puffs Inhalation Twice a day    [provider]  citalopram  (CELEXA ) 10 MG tablet Take 1 tablet (10 mg total) by mouth daily. 04/17/23 04/16/24  Norleen Lynwood ORN, MD  co-enzyme Q-10 50 MG capsule Take 100 mg by mouth daily.    [provider]  colchicine 0.6 MG tablet Take 0.6 mg by mouth daily as  needed (flare  up).    [provider]  cycloSPORINE  (RESTASIS ) 0.05 % ophthalmic emulsion Apply 1 drop to eye 2 (two) times daily as needed (allergies).    [provider]  EPINEPHrine  0.3 mg/0.3 mL IJ SOAJ injection Inject 0.3 mg into the muscle as needed for anaphylaxis. 12/29/19   [provider]  Evolocumab  with Infusor (REPATHA  PUSHTRONEX SYSTEM) 420 MG/3.5ML SOCT INJECT 1 PEN INTO THE SKIN EVERY 30 DAYS 04/28/23   Swinyer, Rosaline HERO, NP  ezetimibe  (ZETIA ) 10 MG tablet Take 1 tablet (10 mg total) by mouth daily. Take on Tues & Fri 07/24/22   Norleen Lynwood ORN, MD  fluticasone  (FLONASE ) 50 MCG/ACT nasal spray Place 1 spray into both nostrils every morning.    [provider]  fluticasone -salmeterol (ADVAIR DISKUS) 500-50 MCG/ACT AEPB 1 puff Inhalation Twice a day    [provider]  fluticasone -salmeterol (ADVAIR) 500-50 MCG/ACT AEPB Inhale 1 puff into the lungs in the morning and at bedtime.    [provider]  gabapentin  (NEURONTIN ) 300 MG capsule Take 1 capsule (300 mg total) by mouth at bedtime. 11/25/22   Norleen Lynwood ORN, MD  Homeopathic Products (ARNICARE) GEL Apply 1 application  topically  daily as needed (soreness).    [provider]  hydrochlorothiazide  (HYDRODIURIL ) 25 MG tablet TAKE 2 TABLETS(50 MG) BY MOUTH DAILY 10/16/22   Norleen Lynwood ORN, MD  ipratropium (ATROVENT ) 0.03 % nasal spray Place 2 sprays into both nostrils every 12 (twelve) hours as needed for rhinitis. 04/18/23   Shelah Lamar RAMAN, MD  levalbuterol  (XOPENEX  HFA) 45 MCG/ACT inhaler Inhale 2 puffs into the lungs every 6 (six) hours as needed for wheezing or shortness of breath. 09/30/19   Norleen Lynwood ORN, MD  levocetirizine (XYZAL ) 5 MG tablet Take 1 tablet (5 mg total) by mouth every evening. 06/24/19   Norleen Lynwood ORN, MD  levothyroxine  (SYNTHROID ) 75 MCG tablet Take 1 tablet (75 mcg total) by mouth daily before breakfast. 01/10/23   Norleen Lynwood ORN, MD  metoprolol  succinate (TOPROL -XL) 25 MG 24 hr tablet TAKE 1/2 TABLET BY MOUTH EVERY DAY 01/14/23   Dann Candyce RAMAN, MD  montelukast  (SINGULAIR ) 10 MG tablet Take 1 tablet (10 mg total) by mouth at bedtime. 07/01/21   Pokhrel, Laxman, MD  mupirocin  ointment (BACTROBAN ) 2 % Apply 1 Application topically 2 (two) times daily. 03/15/23   Dreama, [place]  N, FNP  ondansetron  (ZOFRAN ) 4 MG tablet Take 1 tablet (4 mg total) by mouth every 8 (eight) hours as needed for nausea or vomiting. 06/07/22   Norleen Lynwood ORN, MD  pantoprazole  (PROTONIX ) 40 MG tablet TAKE 1 TABLET(40 MG) BY MOUTH DAILY 03/24/23   Charlanne Groom, MD  potassium chloride  SA (KLOR-CON  M) 20 MEQ tablet TAKE 1 TABLET BY MOUTH DAILY 07/08/22   Norleen Lynwood ORN, MD  rosuvastatin  (CRESTOR ) 10 MG tablet 1 tablet Orally Twice a week    [provider]  traMADol  (ULTRAM ) 50 MG tablet Take 0.5-1 tablets (25-50 mg total) by mouth every 6 (six) hours as needed. 11/25/22   Norleen Lynwood ORN, MD  triamcinolone  cream (KENALOG ) 0.1 % Apply 1 Application topically 2 (two) times daily. 04/17/23 04/16/24  Norleen Lynwood ORN, MD  valACYclovir  (VALTREX ) 1000 MG tablet Take 2,000 mg by mouth 2 (two) times daily. 10/22/22   [provider]  XARELTO  20 MG TABS tablet TAKE 1 TABLET(20 MG) BY MOUTH DAILY WITH SUPPER 04/22/23   Dann Candyce RAMAN, MD    Family History  Family History  Problem Relation Age of Onset   Heart disease Father    Heart attack Father    Rectal cancer Father    Asthma Sister    Heart disease Brother    Heart attack Brother    Stomach cancer Neg Hx    Colon cancer Neg Hx     Social History Social History   Tobacco Use   Smoking status: Former    Current packs/day: 0.00    Average packs/day: 1 pack/day for 4.0 years (4.0 ttl pk-yrs)    Types: Cigarettes    Start date: 12/05/1958    Quit date: 12/05/1962    Years since quitting: 60.4   Smokeless tobacco: Never  Vaping Use   Vaping status: Never Used  Substance Use Topics   Alcohol use: No   Drug use: No     Allergies   Crestor  [rosuvastatin  calcium ], Hydrocodone -acetaminophen , Lidocaine  hcl, Lipitor [atorvastatin  calcium ], Procaine, Rosuvastatin , Theophylline, Vicodin [hydrocodone -acetaminophen ], Codeine, Ephedrine, Other, Oxycodone, Oxycodone-acetaminophen , Propoxyphene, Quinine derivatives, Sulfa  antibiotics, Epinephrine , Penicillins, Quinine, and Theophyllines   Review of Systems Review of Systems  Respiratory:  Positive for cough.      Physical Exam Triage Vital Signs ED Triage Vitals  Encounter Vitals Group     BP 05/06/23 1825 90/60     Systolic BP Percentile --      Diastolic BP Percentile --      Pulse Rate 05/06/23 1825 86     Resp 05/06/23 1825 16     Temp 05/06/23 1825 98.8 F (37.1 C)     Temp Source 05/06/23 1825 Oral     SpO2 05/06/23 1825 98 %     Weight --      Height --      Head Circumference --      Peak Flow --      Pain Score 05/06/23 1824 0     Pain Loc --      Pain Education --      Exclude from Growth Chart --    No data found.  Updated Vital Signs BP 90/60 (BP Location: Right Arm)   Pulse 86   Temp 98.8 F (37.1 C) (Oral)   Resp 16   SpO2 98%   Visual Acuity Right Eye  Distance:   Left Eye Distance:   Bilateral Distance:    Right Eye Near:   Left Eye Near:    Bilateral Near:     Physical Exam Vitals reviewed.  Constitutional:      General: She is not in acute distress.    Appearance: She is not ill-appearing, toxic-appearing or diaphoretic.     Comments: Blood pressure is 90/60 with a heart rate of 86.  HENT:     Right Ear: Tympanic membrane and ear canal normal.     Left Ear: Tympanic membrane and ear canal normal.     Nose: Nose normal.     Mouth/Throat:     Mouth: Mucous membranes are moist.     Pharynx: No oropharyngeal exudate or posterior oropharyngeal erythema.  Eyes:     Extraocular Movements: Extraocular movements intact.     Conjunctiva/sclera: Conjunctivae normal.     Pupils: Pupils are equal, round, and reactive to light.  Cardiovascular:     Rate and Rhythm: Normal rate and regular rhythm.     Heart sounds: No murmur heard. Pulmonary:     Effort: No respiratory distress.     Breath sounds: No stridor. No wheezing, rhonchi or rales.  Comments: There are decreased breath sounds in the right lower lung field Musculoskeletal:     Cervical back: Neck supple.  Lymphadenopathy:     Cervical: No cervical adenopathy.  Skin:    Capillary Refill: Capillary refill takes less than 2 seconds.     Coloration: Skin is not jaundiced or pale.  Neurological:     General: No focal deficit present.     Mental Status: She is alert and oriented to person, place, and time.  Psychiatric:        Behavior: Behavior normal.      UC Treatments / Results  Labs (all labs ordered are listed, but only abnormal results are displayed) Labs Reviewed  POC COVID19/FLU A&B COMBO    EKG   Radiology No results found.  Procedures Procedures (including critical care time)  Medications Ordered in UC Medications - No data to display  Initial Impression / Assessment and Plan / UC Course  I have reviewed the triage vital signs and the nursing  notes.  Pertinent labs & imaging results that were available during my care of the patient were reviewed by me and considered in my medical decision making (see chart for details).     Chest x-ray by my review is stable and does not show any new infiltrate, compared to an x-ray of February 2023 that was read as stable atelectasis.  Flu/COVID swab is negative  Tessalon  Perles are sent in for the cough, albuterol  inhaler and prednisone  are sent in for possible asthma exacerbation.  Her chart notes a procaine allergy , but the patient states she has tolerated Tessalon  Perles in the past and they have helped her Final Clinical Impressions(s) / UC Diagnoses   Final diagnoses:  Shortness of breath  Viral URI with cough  Mild intermittent asthma with exacerbation     Discharge Instructions      The flu and COVID test was negative  By my review, your chest x-ray is unchanged from previous chest x-rays that were read as stable without any pneumonia.  The radiologist will also read your x-ray, and if their interpretation differs significantly from mine, we will call you.  Take benzonatate  100 mg, 1 tab every 8 hours as needed for cough.  Albuterol  inhaler--do 2 puffs every 4 hours as needed for shortness of breath or wheezing  Take prednisone  20 mg--2 daily for 5 days      ED Prescriptions     Medication Sig Dispense Auth. Provider   albuterol  (VENTOLIN  HFA) 108 (90 Base) MCG/ACT inhaler Inhale 2 puffs into the lungs every 4 (four) hours as needed for wheezing or shortness of breath. 1 each Vonna Sharlet POUR, MD   predniSONE  (DELTASONE ) 20 MG tablet Take 2 tablets (40 mg total) by mouth daily with breakfast for 5 days. 10 tablet Vonna Sharlet POUR, MD   benzonatate  (TESSALON ) 100 MG capsule Take 1 capsule (100 mg total) by mouth 3 (three) times daily as needed for cough. 21 capsule Taima Rada K, MD      PDMP not reviewed this encounter.   Vonna Sharlet POUR, MD 05/06/23  [phone]

## 2023-05-06 NOTE — Discharge Instructions (Addendum)
 The flu and COVID test was negative  By my review, your chest x-ray is unchanged from previous chest x-rays that were read as stable without any pneumonia.  The radiologist will also read your x-ray, and if their interpretation differs significantly from mine, we will call you.  Take benzonatate  100 mg, 1 tab every 8 hours as needed for cough.  Albuterol  inhaler--do 2 puffs every 4 hours as needed for shortness of breath or wheezing  Take prednisone  20 mg--2 daily for 5 days

## 2023-05-06 NOTE — ED Triage Notes (Signed)
Pt states cough and congestion for the past 2 days. 

## 2023-05-09 ENCOUNTER — Other Ambulatory Visit: Payer: Self-pay | Admitting: Internal Medicine

## 2023-05-09 ENCOUNTER — Other Ambulatory Visit: Payer: Self-pay

## 2023-06-04 NOTE — Progress Notes (Deleted)
 Cardiology Office Note:  .   Date:  06/04/2023  ID:  [name], DOB [birth year], MRN [id number] PCP: [name], MD  [facility] Providers Cardiologist:  Lance Muss, MD { Click to update primary MD,subspecialty MD or APP then REFRESH:1}   Patient Profile: .      PMH Persistent atrial fibrillation on chronic anticoagulation Coronary artery disease S/p RCA stent 2000 LHC 2001 showed 50% mD1, 20-25% LCX, 20-25% ISR of RCA stent Nuclear stress test 01/2011 showed small partially reversible defect in anterior lateral area, possible breast attentuation Hyperlipidemia Hypertension Breast cancer Hypothyroidism Bradycardia Morbid obesity Gout Arthritis OSA  She had RCA stent and 2000.  Last cardiac catheterization 2001 showed 50% mD1, 20 to 25% LCx, 20 to 25% ISR of RCA stent.  Nuclear stress test 01/2011 showed small partially reversible defect in anterior lateral area, could represent ischemia but also could be breast attenuation, EF 67%.  She was noted to be bradycardic in the 40s November 2018 so atenolol was stopped.  She noticed after stopping atenolol that she can feel her heart pounding faster when she did activity so low-dose metoprolol 12.5 mg twice daily was added back.  She was told in the past that she had a problem with the veins in her legs.  She gets cellulitis at times in the same area and uses antibiotics.  Her ezetimibe was stopped in 2020 and Repatha was continued.  She continued to take Xanax as needed for issues with anxiety and continued to have issues with asthma.  In July 2021 she had vascular congestion noted on CXR.  Echo showed LVEF 60 to 65%, no RWMA, indeterminate diastolic parameters, elevated LVEDP, moderately elevated PASP, severely dilated LA, mildly dilated RA, no significant valve disease.  Admission 06/2021 with pneumonia and elevated troponin downward trending, suspect demand ischemia.  No significant change from previous echo, RA pressure  had increased.  At follow-up visit with Dr. Eldridge Dace 06/22/2021, she had no significant cardiac symptoms.  Her daughter was encouraged to undergo calcium scoring given that both her parents had CAD.  Last cardiology clinic visit was 10/02/2022 with me at which time she was ambulating with a rolling walker.  Reported she was feeling well but admitted to trouble eating after her husband passed away Christmas 2022 and was started on Remeron.  She frequently speaks at her church and continued to live alone and to drive at that time.  Her daughters are close by.  She admitted she does not get much exercise.  She had some mild bruising but no significant bleeding problems and no concerning cardiac symptoms.       History of Present Illness: .   [name] is a *** 88 y.o. female who is here today for follow-up of CAD and a fib.    Discussed the use of AI scribe software for clinical note transcription with the patient, who gave verbal consent to proceed.   ROS: ***       Studies Reviewed: .        *** Risk Assessment/Calculations:    CHA2DS2-VASc Score = 5  {Confirm score is correct.  If not, click here to update score.  REFRESH note.  :1} This indicates a 7.2% annual risk of stroke. The patient's score is based upon: CHF History: 0 HTN History: 1 Diabetes History: 0 Stroke History: 0 Vascular Disease History: 1 Age Score: 2 Gender Score: 1   {This patient has a significant risk of stroke if  diagnosed with atrial fibrillation.  Please consider VKA or DOAC agent for anticoagulation if the bleeding risk is acceptable.   You can also use the SmartPhrase .[email or web address] for documentation.   :[id number]} No BP recorded.  {Refresh Note OR Click here to enter BP  :1}***       Physical Exam:   VS:  There were no vitals taken for this visit.   Wt Readings from Last 3 Encounters:  04/18/23 215 lb 3.2 oz (97.6 kg)  04/17/23 218 lb (98.9 kg)  04/08/23 220 lb (99.8 kg)    GEN: Well nourished, well  developed in no acute distress NECK: No JVD; No carotid bruits CARDIAC: ***RRR, no murmurs, rubs, gallops RESPIRATORY:  Clear to auscultation without rales, wheezing or rhonchi  ABDOMEN: Soft, non-tender, non-distended EXTREMITIES:  No edema; No deformity     ASSESSMENT AND PLAN: .    Persistent atrial fibrillation on chronic anticoagulation: CAD: Hypertension: Hyperlipidemia LDL goal < 55:     {Are you ordering a CV Procedure (e.g. stress test, cath, DCCV, TEE, etc)?   Press F2        :[id number]}  Disposition:***  Signed, Eligha Bridegroom, NP-C

## 2023-06-05 ENCOUNTER — Ambulatory Visit: Payer: Medicare Other | Admitting: Nurse Practitioner

## 2023-06-08 NOTE — Progress Notes (Deleted)
 Cardiology Office Note:  .   Date:  06/08/2023  ID:  [name], DOB [birth year], MRN [id number] PCP: Norleen Lynwood ORN, MD  [facility] Providers Cardiologist:  Candyce Reek, MD { Click to update primary MD,subspecialty MD or APP then REFRESH:1}   Patient Profile: .      PMH Persistent atrial fibrillation on chronic anticoagulation Coronary artery disease S/p RCA stent 2000 LHC 2001 showed 50% mD1, 20-25% LCX, 20-25% ISR of RCA stent Nuclear stress test 01/2011 showed small partially reversible defect in anterior lateral area, possible breast attentuation Hyperlipidemia Hypertension Breast cancer Hypothyroidism Bradycardia Morbid obesity Gout Arthritis OSA  She had RCA stent and 2000.  Last cardiac catheterization 2001 showed 50% mD1, 20 to 25% LCx, 20 to 25% ISR of RCA stent.  Nuclear stress test 01/2011 showed small partially reversible defect in anterior lateral area, could represent ischemia but also could be breast attenuation, EF 67%.  She was noted to be bradycardic in the 40s November 2018 so atenolol  was stopped.  She noticed after stopping atenolol  that she can feel her heart pounding faster when she did activity so low-dose metoprolol  12.5 mg twice daily was added back.  She was told in the past that she had a problem with the veins in her legs.  She gets cellulitis at times in the same area and uses antibiotics.  Her ezetimibe  was stopped in 2020 and Repatha  was continued.  She continued to take Xanax  as needed for issues with anxiety and continued to have issues with asthma.  In July 2021 she had vascular congestion noted on CXR.  Echo showed LVEF 60 to 65%, no RWMA, indeterminate diastolic parameters, elevated LVEDP, moderately elevated PASP, severely dilated LA, mildly dilated RA, no significant valve disease.  Admission 06/2021 with pneumonia and elevated troponin downward trending, suspect demand ischemia.  No significant change from previous echo, RA pressure  had increased.  At follow-up visit with Dr. Reek 06/22/2021, she had no significant cardiac symptoms.  Her daughter was encouraged to undergo calcium  scoring given that both her parents had CAD.  Last cardiology clinic visit was 10/02/2022 with me at which time she was ambulating with a rolling walker.  Reported she was feeling well but admitted to trouble eating after her husband passed away Christmas 2022 and was started on Remeron .  She frequently speaks at her church and continued to live alone and to drive at that time.  Her daughters are close by.  She admitted she does not get much exercise.  She had some mild bruising but no significant bleeding problems and no concerning cardiac symptoms.       History of Present Illness: .   [name] is a *** 88 y.o. female who is here today for follow-up of CAD and a fib.    Discussed the use of AI scribe software for clinical note transcription with the patient, who gave verbal consent to proceed.   ROS: ***       Studies Reviewed: .        *** Risk Assessment/Calculations:    CHA2DS2-VASc Score = 5  {Confirm score is correct.  If not, click here to update score.  REFRESH note.  :1} This indicates a 7.2% annual risk of stroke. The patient's score is based upon: CHF History: 0 HTN History: 1 Diabetes History: 0 Stroke History: 0 Vascular Disease History: 1 Age Score: 2 Gender Score: 1   {This patient has a significant risk of stroke if  diagnosed with atrial fibrillation.  Please consider VKA or DOAC agent for anticoagulation if the bleeding risk is acceptable.   You can also use the SmartPhrase .[email or web address] for documentation.   :[id number]} No BP recorded.  {Refresh Note OR Click here to enter BP  :1}***       Physical Exam:   VS:  There were no vitals taken for this visit.   Wt Readings from Last 3 Encounters:  04/18/23 215 lb 3.2 oz (97.6 kg)  04/17/23 218 lb (98.9 kg)  04/08/23 220 lb (99.8 kg)    GEN: Well nourished, well  developed in no acute distress NECK: No JVD; No carotid bruits CARDIAC: ***RRR, no murmurs, rubs, gallops RESPIRATORY:  Clear to auscultation without rales, wheezing or rhonchi  ABDOMEN: Soft, non-tender, non-distended EXTREMITIES:  No edema; No deformity     ASSESSMENT AND PLAN: .    Persistent atrial fibrillation on chronic anticoagulation: CAD: Hypertension: Hyperlipidemia LDL goal < 55:     {Are you ordering a CV Procedure (e.g. stress test, cath, DCCV, TEE, etc)?   Press F2        :[id number]}  Disposition:***  Signed, Rosaline Bane, NP-C

## 2023-06-12 ENCOUNTER — Ambulatory Visit: Payer: Medicare Other | Admitting: Nurse Practitioner

## 2023-06-16 ENCOUNTER — Other Ambulatory Visit: Payer: Self-pay | Admitting: Internal Medicine

## 2023-06-17 ENCOUNTER — Other Ambulatory Visit: Payer: Self-pay

## 2023-06-25 ENCOUNTER — Other Ambulatory Visit: Payer: Self-pay

## 2023-06-25 ENCOUNTER — Other Ambulatory Visit: Payer: Self-pay | Admitting: Internal Medicine

## 2023-07-04 NOTE — Progress Notes (Signed)
 Cardiology Office Note:  .   Date:  07/07/2023  ID:  [name], DOB 12/15/1935, MRN [id number] PCP: [name], MD  [facility] Providers Cardiologist:  Lance Muss, MD    Patient Profile: .      PMH Persistent atrial fibrillation on chronic anticoagulation Coronary artery disease S/p RCA stent 2000 LHC 2001 showed 50% mD1, 20-25% LCX, 20-25% ISR of RCA stent Nuclear stress test 01/2011 showed small partially reversible defect in anterior lateral area, possible breast attentuation Hyperlipidemia Hypertension Breast cancer Hypothyroidism Bradycardia Morbid obesity Gout Arthritis OSA  She had RCA stent and 2000.  Last cardiac catheterization 2001 showed 50% mD1, 20 to 25% LCx, 20 to 25% ISR of RCA stent.  Nuclear stress test 01/2011 showed small partially reversible defect in anterior lateral area, could represent ischemia but also could be breast attenuation, EF 67%.  She was noted to be bradycardic in the 40s November 2018 so atenolol was stopped.  She noticed after stopping atenolol that she can feel her heart pounding faster when she did activity so low-dose metoprolol 12.5 mg twice daily was added back.  She was told in the past that she had a problem with the veins in her legs.  She gets cellulitis at times in the same area and uses antibiotics.  Her ezetimibe was stopped in 2020 and Repatha was continued.  She continued to take Xanax as needed for issues with anxiety and continued to have issues with asthma.  In July 2021 she had vascular congestion noted on CXR.  Echo showed LVEF 60 to 65%, no RWMA, indeterminate diastolic parameters, elevated LVEDP, moderately elevated PASP, severely dilated LA, mildly dilated RA, no significant valve disease.  Admission 06/2021 with pneumonia and elevated troponin downward trending, suspect demand ischemia.  No significant change from previous echo, RA pressure had increased.  At follow-up visit with Dr. Eldridge Dace 06/22/2021, she  had no significant cardiac symptoms.  Her daughter was encouraged to undergo calcium scoring given that both her parents had CAD.  Last cardiology clinic visit was 10/02/2022 with me at which time she was ambulating with a rolling walker.  Reported she was feeling well but admitted to trouble eating after her husband passed away Christmas 2022 and was started on Remeron.  She frequently speaks at her church and continued to live alone and to drive at that time.  Her daughters are close by.  She admitted she does not get much exercise.  She had some mild bruising but no significant bleeding problems and no concerning cardiac symptoms.  ED visit 05/06/23 for cough. No acute findings on CXR. Rx treatment for cough.       History of Present Illness: .   [name] is a very pleasant 88 y.o. female who is here today for follow-up of CAD and a fib. Reports feeling foggy-headed, which is unusual for her. She attributes this to sinus issues and staying up late to take care of a ferol cat. She had a concussion about a year ago but feels that she recovered.  The way she feels today reminds her of how she felt post-concussion. She continues to live alone and manages well, doing her own housework and maintaining an active lifestyle. She reports a lack of appetite, which began after her husband's death two years ago. She makes an effort to eat healthily, including fruits, vegetables, and lean proteins, and avoids fatty foods. She has been on Repatha sureclick but pharmacy told her it is  discontinued.  She is due for an injection.  She was not aware of elevated cholesterol levels from labs completed 11/2022. She rarely checks home BP. She denies chest pain, shortness of breath, palpitations, orthopnea, PND, presyncope, syncope. She recognizes high pill burden and concern for polypharmacy.  Discussed the use of AI scribe software for clinical note transcription with the patient, who gave verbal consent to proceed.    ROS: See HPI       Studies Reviewed: .         Risk Assessment/Calculations:    CHA2DS2-VASc Score = 5   This indicates a 7.2% annual risk of stroke. The patient's score is based upon: CHF History: 0 HTN History: 1 Diabetes History: 0 Stroke History: 0 Vascular Disease History: 1 Age Score: 2 Gender Score: 1            Physical Exam:   VS:  BP 130/72   Pulse 69   Ht 5\' 4"  (1.626 m)   Wt 224 lb 1.6 oz (101.7 kg)   SpO2 94%   BMI 38.47 kg/m    Wt Readings from Last 3 Encounters:  07/07/23 224 lb 1.6 oz (101.7 kg)  04/18/23 215 lb 3.2 oz (97.6 kg)  04/17/23 218 lb (98.9 kg)    GEN: Well nourished, well developed in no acute distress NECK: No JVD; No carotid bruits CARDIAC: Irregular RR, no murmurs, rubs, gallops RESPIRATORY:  Clear to auscultation without rales, wheezing or rhonchi  ABDOMEN: Soft, non-tender, non-distended EXTREMITIES:  No edema; No deformity     ASSESSMENT AND PLAN: .    Longstanding atrial fibrillation on chronic anticoagulation: HR is well controlled. She is overall asymptomatic. No bleeding concerns. Creatinine clearance is 63 mL/min, therefore Xarelto 20 mg daily is appropriate dose for stroke prevention for CHA2DS2-VASc score of 5.  Continue low-dose metoprolol for rate control.  CAD: History of stent to RCA in 2000. Nuclear stress test 01/2011 showed small partially reversible defect in anterior lateral area, possible breast attenuation. She denies chest pain, dyspnea, or other symptoms concerning for angina.  No indication for further ischemic evaluation at this time.  LDL is elevated.  We had a lengthy discussion about this.  We will retest in 1 month when she has resumed Repatha. Exercise is limited due to age-related bone and joint issues, but she is active around her home. Encouraged her to continue heart healthy mostly plant based diet avoiding saturated fat, processed foods, simple carbohydrates, and sugar.  Continue metoprolol, Repatha,  ezetimibe, rosuvastatin  Hypertension: BP is well controlled.  I have encouraged her to monitor at home periodically to ensure BP is well-controlled. No medication changes today.   Medication management: She recognizes high pill burden and concerns for polypharmacy. In regards to cardiac medications, will recheck lipids to ensure she needs 3 separate agents for lipid management. Several of her medications are PRN.   Hyperlipidemia LDL goal < 55: Lipid panel completed 11/25/2023 with total cholesterol 198, HDL 70.8, LDL 113, and triglycerides 71. Reports she was not aware that cholesterol was elevated on last lab tests. Reports compliance with Repatha although she had been on pushtronex form and will have to switch to sureclick injection and with ezetimibe, but thinks she may have inadvertently discontinued rosuvastatin.  We will refill the prescription today.  Will get fasting lipid panel in 1 month when she is back on Repatha (is due now but pharmacy did not have correct Rx).        Disposition: 6  months with Dr. Jacques Navy (transfer from [place])  Signed, Eligha Bridegroom, NP-C

## 2023-07-07 ENCOUNTER — Other Ambulatory Visit: Payer: Self-pay | Admitting: *Deleted

## 2023-07-07 ENCOUNTER — Encounter: Payer: Self-pay | Admitting: Nurse Practitioner

## 2023-07-07 ENCOUNTER — Ambulatory Visit: Payer: Medicare Other | Attending: Nurse Practitioner | Admitting: Nurse Practitioner

## 2023-07-07 VITALS — BP 130/72 | HR 69 | Ht 64.0 in | Wt 224.1 lb

## 2023-07-07 DIAGNOSIS — I10 Essential (primary) hypertension: Secondary | ICD-10-CM | POA: Diagnosis not present

## 2023-07-07 DIAGNOSIS — Z7901 Long term (current) use of anticoagulants: Secondary | ICD-10-CM

## 2023-07-07 DIAGNOSIS — I2510 Atherosclerotic heart disease of native coronary artery without angina pectoris: Secondary | ICD-10-CM

## 2023-07-07 DIAGNOSIS — I4811 Longstanding persistent atrial fibrillation: Secondary | ICD-10-CM | POA: Diagnosis not present

## 2023-07-07 DIAGNOSIS — Z79899 Other long term (current) drug therapy: Secondary | ICD-10-CM

## 2023-07-07 DIAGNOSIS — E785 Hyperlipidemia, unspecified: Secondary | ICD-10-CM

## 2023-07-07 DIAGNOSIS — E782 Mixed hyperlipidemia: Secondary | ICD-10-CM

## 2023-07-07 DIAGNOSIS — I482 Chronic atrial fibrillation, unspecified: Secondary | ICD-10-CM

## 2023-07-07 MED ORDER — REPATHA SURECLICK 140 MG/ML ~~LOC~~ SOAJ: 140.0000 mg | SUBCUTANEOUS | 3 refills | Status: DC

## 2023-07-07 MED ORDER — ROSUVASTATIN CALCIUM 10 MG PO TABS: 10.0000 mg | ORAL_TABLET | ORAL | 3 refills | Status: DC

## 2023-07-07 NOTE — Patient Instructions (Signed)
 Medication Instructions:   Your physician recommends that you continue on your current medications as directed. Please refer to the Current Medication list given to you today.   *If you need a refill on your cardiac medications before your next appointment, please call your pharmacy*   Lab Work:  Your physician recommends that you return for a FASTING lipid profile in 1 month fasting after midnight.  Patient given paperwork today.    If you have labs (blood work) drawn today and your tests are completely normal, you will receive your results only by: MyChart Message (if you have MyChart) OR A paper copy in the mail If you have any lab test that is abnormal or we need to change your treatment, we will call you to review the results.   Testing/Procedures:   None ordered.   Follow-Up: At [facility], you and your health needs are our priority.  As part of our continuing mission to provide you with exceptional heart care, we have created designated Provider Care Teams.  These Care Teams include your primary Cardiologist (physician) and Advanced Practice Providers (APPs -  Physician Assistants and Nurse Practitioners) who all work together to provide you with the care you need, when you need it.  We recommend signing up for the patient portal called "MyChart".  Sign up information is provided on this After Visit Summary.  MyChart is used to connect with patients for Virtual Visits (Telemedicine).  Patients are able to view lab/test results, encounter notes, upcoming appointments, etc.  Non-urgent messages can be sent to your provider as well.   To learn more about what you can do with MyChart, go to [email or web address].    Your next appointment:   6 month(s)  Provider:   Dr. Jacques Navy   Other Instructions  Your physician wants you to follow-up in: 6 months.  You will receive a reminder letter in the mail two months in advance. If you don't receive a letter, please call  our office to schedule the follow-up appointment.

## 2023-08-11 ENCOUNTER — Telehealth: Payer: Self-pay | Admitting: Nurse Practitioner

## 2023-08-11 NOTE — Telephone Encounter (Signed)
 Pt c/o medication issue:  1. Name of Medication:   Evolocumab (REPATHA SURECLICK) 140 MG/ML SOAJ    2. How are you currently taking this medication (dosage and times per day)? As written   3. Are you having a reaction (difficulty breathing--STAT)? No   4. What is your medication issue?  Pt called in stating mail order was never received for this medication. Please advise.

## 2023-08-12 MED ORDER — REPATHA SURECLICK 140 MG/ML ~~LOC~~ SOAJ: 140.0000 mg | SUBCUTANEOUS | 3 refills | Status: AC

## 2023-08-12 NOTE — Telephone Encounter (Signed)
 Called and spoke with patient. [facility] was sent to [facility] for repatha, but they may have not been able to fill it. Rx sent to [facility] per pt request

## 2023-08-14 ENCOUNTER — Other Ambulatory Visit: Payer: Self-pay | Admitting: Gastroenterology

## 2023-08-14 ENCOUNTER — Other Ambulatory Visit: Payer: Self-pay | Admitting: Internal Medicine

## 2023-08-15 ENCOUNTER — Other Ambulatory Visit: Payer: Self-pay

## 2023-08-18 ENCOUNTER — Other Ambulatory Visit: Payer: Self-pay | Admitting: Internal Medicine

## 2023-08-18 ENCOUNTER — Other Ambulatory Visit: Payer: Self-pay

## 2023-08-28 ENCOUNTER — Emergency Department (HOSPITAL_COMMUNITY)
Admission: EM | Admit: 2023-08-28 | Discharge: 2023-08-28 | Disposition: A | Discharge status: Home / Self Care | Attending: Emergency Medicine | Admitting: Emergency Medicine

## 2023-08-28 ENCOUNTER — Other Ambulatory Visit: Payer: Self-pay

## 2023-08-28 ENCOUNTER — Encounter (HOSPITAL_COMMUNITY): Payer: Self-pay | Admitting: Emergency Medicine

## 2023-08-28 DIAGNOSIS — S81811A Laceration without foreign body, right lower leg, initial encounter: Secondary | ICD-10-CM | POA: Diagnosis not present

## 2023-08-28 DIAGNOSIS — W208XXA Other cause of strike by thrown, projected or falling object, initial encounter: Secondary | ICD-10-CM | POA: Insufficient documentation

## 2023-08-28 MED ORDER — BACITRACIN ZINC 500 UNIT/GM EX OINT
TOPICAL_OINTMENT | Freq: Two times a day (BID) | CUTANEOUS | Status: DC
  Administered 2023-08-28: 1 via TOPICAL
  Filled 2023-08-28: qty 1.8

## 2023-08-28 MED ORDER — BACITRACIN ZINC 500 UNIT/GM EX OINT: 1.0000 | TOPICAL_OINTMENT | Freq: Every day | CUTANEOUS | 0 refills | Status: AC

## 2023-08-28 MED ORDER — LIDOCAINE-EPINEPHRINE (PF) 2 %-1:200000 IJ SOLN
30.0000 mL | Freq: Once | INTRAMUSCULAR | Status: AC
  Administered 2023-08-28: 30 mL
  Filled 2023-08-28: qty 40

## 2023-08-28 NOTE — ED Notes (Signed)
 Wound redressed in triage to assist with stopping the bleeding.

## 2023-08-28 NOTE — ED Triage Notes (Addendum)
 Patient presents due to a skin tear/ laceration on her right leg. Injury was caused by moving a drawer. Patient is on a blood thinner.

## 2023-08-28 NOTE — ED Provider Notes (Signed)
 [place] EMERGENCY DEPARTMENT AT [facility] Provider Note   CSN: [id number] Arrival date & time: 08/28/23  1139     History  Chief Complaint  Patient presents with   Laceration    88 year old female on blood thinners presenting for right lower extremity laceration she sustained from dropping a drawer on her leg.  She did not fall.  She is able to weight-bear immediately, hemostasis achieved with direct pressure.   Laceration      Home Medications Prior to Admission medications   Medication Sig Start Date End Date Taking? Authorizing Provider  albuterol  (PROVENTIL ) (2.5 MG/3ML) 0.083% nebulizer solution Take 3 mLs (2.5 mg total) by nebulization every 6 (six) hours as needed for wheezing or shortness of breath. 10/12/21   Cobb, Mariah Shines, NP  albuterol  (VENTOLIN  HFA) 108 (90 Base) MCG/ACT inhaler Inhale 2 puffs into the lungs every 4 (four) hours as needed for wheezing or shortness of breath. 05/06/23   Coley Keto, MD  ALLERGY  RELIEF 180 MG tablet TAKE 1 TABLET(180 MG) BY MOUTH DAILY 03/24/23   Denson Flake, MD  allopurinol  (ZYLOPRIM ) 300 MG tablet Take 300 mg by mouth daily.    [provider]  ALPRAZolam  (XANAX ) 0.5 MG tablet TAKE 1 TO 2 TABLETS BY MOUTH TWICE DAILY AS NEEDED FOR ANXIETY OR SLEEP 11/25/22   Roslyn Coombe, MD  AMBULATORY NON FORMULARY MEDICATION Medication Name: Prednisone  Injection in knee    [provider]  azelastine  (ASTELIN ) 0.1 % nasal spray Place 2 sprays into both nostrils daily. Use in each nostril as directed Patient taking differently: Place 2 sprays into both nostrils in the morning. 07/14/19   Roslyn Coombe, MD  benzonatate  (TESSALON ) 100 MG capsule Take 1 capsule (100 mg total) by mouth 3 (three) times daily as needed for cough. 05/06/23   Nika Keto, MD  bisacodyl  (DULCOLAX) 10 MG suppository Place 1 suppository (10 mg total) rectally as needed for moderate constipation. 12/09/22   Corey, Evan S, MD   budesonide -formoterol  (SYMBICORT ) 80-4.5 MCG/ACT inhaler 2 puffs Inhalation Twice a day    [provider]  citalopram  (CELEXA ) 10 MG tablet Take 1 tablet (10 mg total) by mouth daily. 04/17/23 04/16/24  Roslyn Coombe, MD  co-enzyme Q-10 50 MG capsule Take 100 mg by mouth daily.    [provider]  colchicine 0.6 MG tablet Take 0.6 mg by mouth daily as needed (flare  up).    [provider]  cycloSPORINE  (RESTASIS ) 0.05 % ophthalmic emulsion Apply 1 drop to eye 2 (two) times daily as needed (allergies).    [provider]  EPINEPHrine  0.3 mg/0.3 mL IJ SOAJ injection Inject 0.3 mg into the muscle as needed for anaphylaxis. 12/29/19   [provider]  Evolocumab  (REPATHA  SURECLICK) 140 MG/ML SOAJ Inject 140 mg into the skin every 14 (fourteen) days. 08/12/23   Swinyer, Leilani Punter, NP  ezetimibe  (ZETIA ) 10 MG tablet TAKE 1 TABLET BY MOUTH EVERY DAY. TAKE ON TUESDAY AND FRIDAY 08/18/23   Roslyn Coombe, MD  fluticasone  (FLONASE ) 50 MCG/ACT nasal spray Place 1 spray into both nostrils every morning.    [provider]  fluticasone -salmeterol (ADVAIR DISKUS) 500-50 MCG/ACT AEPB 1 puff Inhalation Twice a day    [provider]  fluticasone -salmeterol (ADVAIR) 500-50 MCG/ACT AEPB Inhale 1 puff into the lungs in the morning and at bedtime.    [provider]  gabapentin  (NEURONTIN ) 300 MG capsule TAKE 1 CAPSULE(300 MG) BY  MOUTH AT BEDTIME 06/17/23   Roslyn Coombe, MD  Homeopathic Products (ARNICARE) GEL Apply 1 application  topically daily as needed (soreness).    [provider]  hydrochlorothiazide  (HYDRODIURIL ) 25 MG tablet TAKE 2 TABLETS(50 MG) BY MOUTH DAILY 05/09/23   Roslyn Coombe, MD  ipratropium (ATROVENT ) 0.03 % nasal spray Place 2 sprays into both nostrils every 12 (twelve) hours as needed for rhinitis. 04/18/23   Denson Flake, MD  levalbuterol  (XOPENEX  HFA) 45 MCG/ACT inhaler Inhale 2 puffs into the lungs every 6 (six)  hours as needed for wheezing or shortness of breath. 09/30/19   Roslyn Coombe, MD  levocetirizine (XYZAL ) 5 MG tablet Take 1 tablet (5 mg total) by mouth every evening. 06/24/19   Roslyn Coombe, MD  levothyroxine  (SYNTHROID ) 75 MCG tablet TAKE 1 TABLET(75 MCG) BY MOUTH DAILY BEFORE BREAKFAST 06/25/23   Roslyn Coombe, MD  metoprolol  succinate (TOPROL -XL) 25 MG 24 hr tablet TAKE 1/2 TABLET BY MOUTH EVERY DAY 01/14/23   Lucendia Rusk, MD  montelukast  (SINGULAIR ) 10 MG tablet Take 1 tablet (10 mg total) by mouth at bedtime. 07/01/21   Pokhrel, Laxman, MD  mupirocin  ointment (BACTROBAN ) 2 % Apply 1 Application topically 2 (two) times daily. 03/15/23   Harlow Lighter, [place]  N, FNP  ondansetron  (ZOFRAN ) 4 MG tablet Take 1 tablet (4 mg total) by mouth every 8 (eight) hours as needed for nausea or vomiting. 06/07/22   Roslyn Coombe, MD  pantoprazole  (PROTONIX ) 40 MG tablet TAKE 1 TABLET(40 MG) BY MOUTH DAILY 08/14/23   Lajuan Pila, MD  potassium chloride  SA (KLOR-CON  M) 20 MEQ tablet TAKE 1 TABLET BY MOUTH DAILY 08/15/23   Roslyn Coombe, MD  rosuvastatin  (CRESTOR ) 10 MG tablet Take 1 tablet (10 mg total) by mouth 2 (two) times a week. 07/07/23   Swinyer, Leilani Punter, NP  traMADol  (ULTRAM ) 50 MG tablet Take 0.5-1 tablets (25-50 mg total) by mouth every 6 (six) hours as needed. 11/25/22   Roslyn Coombe, MD  triamcinolone  cream (KENALOG ) 0.1 % Apply 1 Application topically 2 (two) times daily. 04/17/23 04/16/24  Roslyn Coombe, MD  valACYclovir  (VALTREX ) 1000 MG tablet Take 2,000 mg by mouth 2 (two) times daily. 10/22/22   [provider]  XARELTO  20 MG TABS tablet TAKE 1 TABLET(20 MG) BY MOUTH DAILY WITH SUPPER 04/22/23   Varanasi, Jayadeep S, MD      Allergies    Crestor  [rosuvastatin  calcium ], Hydrocodone -acetaminophen , Lidocaine  hcl, Lipitor [atorvastatin  calcium ], Procaine, Rosuvastatin , Theophylline, Vicodin [hydrocodone -acetaminophen ], Codeine, Ephedrine, Other, Oxycodone, Oxycodone-acetaminophen ,  Propoxyphene, Quinine derivatives, Sulfa  antibiotics, Epinephrine , Penicillins, Quinine, and Theophyllines    Review of Systems   Review of Systems  Skin:  Positive for wound (on right lower extremity).   Physical Exam Updated Vital Signs BP (!) 126/91 (BP Location: Right Arm)   Pulse 96   Temp 97.9 F (36.6 C) (Oral)   Resp 18   SpO2 99%  Physical Exam Constitutional:      Appearance: Normal appearance.  HENT:     Head: Normocephalic and atraumatic.  Eyes:     Extraocular Movements: Extraocular movements intact.     Conjunctiva/sclera: Conjunctivae normal.     Pupils: Pupils are equal, round, and reactive to light.  Skin:    Comments: Laceration ~6 cm, with flap, on right lower extremity. No signs of infection. Mild dusky appearance at edges of flap.  Neurological:     Mental Status: She is alert.     ED  Results / Procedures / Treatments   Labs (all labs ordered are listed, but only abnormal results are displayed) Labs Reviewed - No data to display  EKG None  Radiology No results found.  Procedures .Laceration Repair  Date/Time: 08/28/2023 12:56 PM  Performed by: Lavada Porteous, DO Authorized by: Deatra Face, MD   Consent:    Consent obtained:  Verbal   Consent given by:  Patient   Risks, benefits, and alternatives were discussed: yes     Risks discussed:  Infection, pain, retained foreign body, poor cosmetic result and poor wound healing   Alternatives discussed:  No treatment and observation Universal protocol:    Patient identity confirmed:  Verbally with patient Anesthesia:    Anesthesia method:  Local infiltration   Local anesthetic:  Lidocaine  1% WITH epi Laceration details:    Location:  Leg   Leg location:  R lower leg   Length (cm):  6 Pre-procedure details:    Preparation:  Patient was prepped and draped in usual sterile fashion Exploration:    Hemostasis achieved with:  Epinephrine  and direct pressure   Contaminated: no   Treatment:     Area cleansed with:  Povidone-iodine   Amount of cleaning:  Standard   Irrigation solution:  Sterile saline   Irrigation volume:  40 ml   Irrigation method:  Syringe   Visualized foreign bodies/material removed: no     Debridement:  None Skin repair:    Repair method:  Sutures   Suture size:  4-0   Suture material:  Nylon   Suture technique:  Simple interrupted Approximation:    Approximation:  Loose Repair type:    Repair type:  Simple Post-procedure details:    Dressing:  Antibiotic ointment, non-adherent dressing and tube gauze   Procedure completion:  Tolerated     Medications Ordered in ED Medications  lidocaine -EPINEPHrine  (XYLOCAINE W/EPI) 2 %-1:200000 (PF) injection 30 mL (has no administration in time range)  bacitracin  ointment (has no administration in time range)    ED Course/ Medical Decision Making/ A&P                                 Medical Decision Making 88 year old female presenting with right lower leg laceration, on blood thinners.  Hemostatic wound, flap present.  Plan for closure with sutures (see procedure note above).  Follow-up with PCP in the next week, recommend removal of nonabsorbable sutures in approximately 7-10 days pending healing. Nonstick bandage with topical antibiotic placed. Wound care instructions provided to patient.  Discharged home in stable good condition.  Risk OTC drugs. Prescription drug management.         Final Clinical Impression(s) / ED Diagnoses Final diagnoses:  None    Rx / DC Orders ED Discharge Orders     None         Lavada Porteous, DO 08/28/23 1323    Deatra Face, MD 08/30/23 [phone]

## 2023-08-28 NOTE — Discharge Instructions (Signed)
 Use bacitracin  ointment with dressing changes. You can change the dressing daily. You can shower after 24 hours, but do not submerge the laceration in water until skin is healed.  2. Cover the wound: Use an occlusive or semi-occlusive dressing to keep the wound moist and protected. This promotes faster healing and reduces the risk of infection  3. Wound inspection: Inspect the wound daily for signs of infection, such as increased redness, swelling, warmth, or discharge. If any of these signs are present, contact your healthcare provider immediately.  4. Activity restrictions: Avoid strenuous activities that may stress the wound site. Elevate the leg when possible to reduce swelling and promote healing.  5. Suture care: Sutures should remain in place until they are removed by your healthcare provider. The timing of suture removal depends on the location and healing progress, typically around 10-14 days for leg wounds.  6. Pain management: Over-the-counter pain relievers such as acetaminophen  or ibuprofen can be used to manage pain. Follow the dosage instructions provided by your healthcare provider.  7. Follow-up: Schedule a follow-up appointment with your healthcare provider to assess wound healing and remove sutures.

## 2023-09-01 ENCOUNTER — Telehealth: Payer: Self-pay

## 2023-09-01 DIAGNOSIS — Z79899 Other long term (current) drug therapy: Secondary | ICD-10-CM | POA: Diagnosis not present

## 2023-09-01 DIAGNOSIS — M1991 Primary osteoarthritis, unspecified site: Secondary | ICD-10-CM | POA: Diagnosis not present

## 2023-09-01 DIAGNOSIS — M1009 Idiopathic gout, multiple sites: Secondary | ICD-10-CM | POA: Diagnosis not present

## 2023-09-01 NOTE — Transitions of Care (Post Inpatient/ED Visit) (Signed)
 09/01/2023  Name: [name] MRN: [id number] DOB: [birth year]  Today's TOC FU Call Status: Today's TOC FU Call Status:: Successful TOC FU Call Completed TOC FU Call Complete Date: 09/01/23 Patient's Name and Date of Birth confirmed.  Transition Care Management Follow-up Telephone Call Date of Discharge: 08/28/23 Discharge Facility: [facility]) Type of Discharge: Emergency Department Reason for ED Visit: Other: (laceration) How have you been since you were released from the hospital?: Better Any questions or concerns?: No  Items Reviewed: Did you receive and understand the discharge instructions provided?: Yes Medications obtained,verified, and reconciled?: Yes (Medications Reviewed) Any new allergies since your discharge?: No Dietary orders reviewed?: Yes Do you have support at home?: No  Medications Reviewed Today: Medications Reviewed Today     Reviewed by Darrall Ellison, LPN (Licensed Practical Nurse) on 09/01/23 at 1645  Med List Status: <None>   Medication Order Taking? Sig Documenting Provider Last Dose Status Informant  albuterol  (PROVENTIL ) (2.5 MG/3ML) 0.083% nebulizer solution [id number] No Take 3 mLs (2.5 mg total) by nebulization every 6 (six) hours as needed for wheezing or shortness of breath. Roetta Clarke, NP Taking Active   albuterol  (VENTOLIN  HFA) 108 (90 Base) MCG/ACT inhaler [id number] No Inhale 2 puffs into the lungs every 4 (four) hours as needed for wheezing or shortness of breath. Charlei Keto, MD Taking Active   ALLERGY  RELIEF 180 MG tablet [id number] No TAKE 1 TABLET(180 MG) BY MOUTH DAILY Byrum, Delora Ferry, MD Taking Active   allopurinol  (ZYLOPRIM ) 300 MG tablet [id number] No Take 300 mg by mouth daily. [provider] Taking Active Self  ALPRAZolam  (XANAX ) 0.5 MG tablet [id number] No TAKE 1 TO 2 TABLETS BY MOUTH TWICE DAILY AS NEEDED FOR ANXIETY OR SLEEP Roslyn Coombe, MD Taking Active   AMBULATORY [facility] MEDICATION [id number]  No Medication Name: Prednisone  Injection in knee [provider] Taking Active   azelastine  (ASTELIN ) 0.1 % nasal spray [id number] No Place 2 sprays into both nostrils daily. Use in each nostril as directed  Patient taking differently: Place 2 sprays into both nostrils in the morning.   Roslyn Coombe, MD Taking Active Self  bacitracin  ointment [id number]  Apply 1 Application topically daily. With dressing changes Lavada Porteous, DO  Active   benzonatate  (TESSALON ) 100 MG capsule [id number] No Take 1 capsule (100 mg total) by mouth 3 (three) times daily as needed for cough. Leightyn Keto, MD Taking Active   bisacodyl  (DULCOLAX) 10 MG suppository [id number] No Place 1 suppository (10 mg total) rectally as needed for moderate constipation. Syliva Even, MD Taking Active   budesonide -formoterol  (SYMBICORT ) 80-4.5 MCG/ACT inhaler [id number] No 2 puffs Inhalation Twice a day [provider] Taking Active   citalopram  (CELEXA ) 10 MG tablet [id number] No Take 1 tablet (10 mg total) by mouth daily. Roslyn Coombe, MD Taking Active   co-enzyme Q-10 50 MG capsule [id number] No Take 100 mg by mouth daily. [provider] Taking Active Self  colchicine 0.6 MG tablet [id number] No Take 0.6 mg by mouth daily as needed (flare  up). [provider] Taking Active Self           Med Note Larey Plenty, NATASHA   Fri Feb 27, 2018  2:53 PM)    cycloSPORINE  (RESTASIS ) 0.05 % ophthalmic emulsion [id number] No Apply 1 drop to eye 2 (two) times daily as needed (allergies). [provider] Taking Active Self  EPINEPHrine  0.3 mg/0.3 mL IJ SOAJ injection [id number] No Inject 0.3  mg into the muscle as needed for anaphylaxis. [provider] Taking Active Self  Evolocumab  (REPATHA  SURECLICK) 140 MG/ML SOAJ [id number]  Inject 140 mg into the skin every 14 (fourteen) days. Swinyer, Leilani Punter, NP  Active   ezetimibe  (ZETIA ) 10 MG tablet [id number]  TAKE 1 TABLET BY MOUTH EVERY DAY. TAKE ON  TUESDAY AND FRIDAY Roslyn Coombe, MD  Active   fluticasone  (FLONASE ) 50 MCG/ACT nasal spray [id number] No Place 1 spray into both nostrils every morning. [provider] Taking Active Self  fluticasone -salmeterol (ADVAIR DISKUS) 500-50 MCG/ACT AEPB [id number] No 1 puff Inhalation Twice a day [provider] Taking Active   fluticasone -salmeterol (ADVAIR) 500-50 MCG/ACT AEPB [id number] No Inhale 1 puff into the lungs in the morning and at bedtime. [provider] Taking Active   gabapentin  (NEURONTIN ) 300 MG capsule [id number] No TAKE 1 CAPSULE(300 MG) BY MOUTH AT BEDTIME Roslyn Coombe, MD Taking Active   Homeopathic Products (ARNICARE) GEL [id number] No Apply 1 application  topically daily as needed (soreness). [provider] Taking Active Self  hydrochlorothiazide  (HYDRODIURIL ) 25 MG tablet [id number] No TAKE 2 TABLETS(50 MG) BY MOUTH DAILY Roslyn Coombe, MD Taking Active   ipratropium (ATROVENT ) 0.03 % nasal spray [id number] No Place 2 sprays into both nostrils every 12 (twelve) hours as needed for rhinitis. Denson Flake, MD Taking Active   levalbuterol  (XOPENEX  HFA) 45 MCG/ACT inhaler [id number] No Inhale 2 puffs into the lungs every 6 (six) hours as needed for wheezing or shortness of breath. Roslyn Coombe, MD Taking Active Self  levocetirizine (XYZAL ) 5 MG tablet [id number] No Take 1 tablet (5 mg total) by mouth every evening. Roslyn Coombe, MD Taking Active Self  levothyroxine  (SYNTHROID ) 75 MCG tablet [id number] No TAKE 1 TABLET(75 MCG) BY MOUTH DAILY BEFORE BREAKFAST Roslyn Coombe, MD Taking Active   metoprolol  succinate (TOPROL -XL) 25 MG 24 hr tablet [id number] No TAKE 1/2 TABLET BY MOUTH EVERY DAY Lucendia Rusk, MD Taking Active   montelukast  (SINGULAIR ) 10 MG tablet [id number] No Take 1 tablet (10 mg total) by mouth at bedtime. Pokhrel, Laxman, MD Taking Active   mupirocin  ointment (BACTROBAN ) 2 % [id number] No Apply 1 Application topically 2 (two) times  daily. Harlow Lighter, [place]  N, FNP Taking Active   ondansetron  (ZOFRAN ) 4 MG tablet [id number] No Take 1 tablet (4 mg total) by mouth every 8 (eight) hours as needed for nausea or vomiting. Roslyn Coombe, MD Taking Active   pantoprazole  (PROTONIX ) 40 MG tablet [id number]  TAKE 1 TABLET(40 MG) BY MOUTH DAILY Lajuan Pila, MD  Active   potassium chloride  SA (KLOR-CON  M) 20 MEQ tablet [id number]  TAKE 1 TABLET BY MOUTH DAILY Roslyn Coombe, MD  Active   rosuvastatin  (CRESTOR ) 10 MG tablet [id number]  Take 1 tablet (10 mg total) by mouth 2 (two) times a week. Swinyer, Leilani Punter, NP  Active   traMADol  (ULTRAM ) 50 MG tablet [id number] No Take 0.5-1 tablets (25-50 mg total) by mouth every 6 (six) hours as needed. Roslyn Coombe, MD Taking Active   triamcinolone  cream (KENALOG ) 0.1 % [id number] No Apply 1 Application topically 2 (two) times daily. Roslyn Coombe, MD Taking Active   valACYclovir  (VALTREX ) 1000 MG tablet [id number] No Take 2,000 mg by mouth 2 (two) times daily. [provider] Taking Active   XARELTO  20 MG TABS tablet [id number] No TAKE 1 TABLET(20 MG) BY MOUTH DAILY WITH SUPPER Lucendia Rusk, MD Taking Active  Home Care and Equipment/Supplies: Were Home Health Services Ordered?: NA Any new equipment or medical supplies ordered?: NA  Functional Questionnaire: Do you need assistance with bathing/showering or dressing?: No Do you need assistance with meal preparation?: No Do you need assistance with eating?: No Do you have difficulty maintaining continence: No Do you need assistance with getting out of bed/getting out of a chair/moving?: No Do you have difficulty managing or taking your medications?: No  Follow up appointments reviewed: PCP Follow-up appointment confirmed?: Yes Date of PCP follow-up appointment?: 09/05/23 Follow-up Provider: [facility] Follow-up appointment confirmed?: NA Do you need transportation to your follow-up appointment?:  No Do you understand care options if your condition(s) worsen?: Yes-patient verbalized understanding    SIGNATURE Darrall Ellison, LPN [facility] Nurse Health Advisor Direct Dial [phone]

## 2023-09-04 ENCOUNTER — Inpatient Hospital Stay: Admitting: Internal Medicine

## 2023-09-05 ENCOUNTER — Encounter: Payer: Self-pay | Admitting: Internal Medicine

## 2023-09-05 ENCOUNTER — Telehealth: Payer: Self-pay | Admitting: Internal Medicine

## 2023-09-05 ENCOUNTER — Ambulatory Visit (INDEPENDENT_AMBULATORY_CARE_PROVIDER_SITE_OTHER): Admitting: Internal Medicine

## 2023-09-05 VITALS — BP 122/80 | HR 72 | Temp 98.5°F | Ht 64.0 in | Wt 209.0 lb

## 2023-09-05 DIAGNOSIS — S81801A Unspecified open wound, right lower leg, initial encounter: Secondary | ICD-10-CM | POA: Diagnosis not present

## 2023-09-05 DIAGNOSIS — E782 Mixed hyperlipidemia: Secondary | ICD-10-CM

## 2023-09-05 DIAGNOSIS — R7303 Prediabetes: Secondary | ICD-10-CM | POA: Diagnosis not present

## 2023-09-05 DIAGNOSIS — E538 Deficiency of other specified B group vitamins: Secondary | ICD-10-CM | POA: Diagnosis not present

## 2023-09-05 DIAGNOSIS — S12101A Unspecified nondisplaced fracture of second cervical vertebra, initial encounter for closed fracture: Secondary | ICD-10-CM | POA: Insufficient documentation

## 2023-09-05 DIAGNOSIS — F32A Depression, unspecified: Secondary | ICD-10-CM

## 2023-09-05 DIAGNOSIS — F419 Anxiety disorder, unspecified: Secondary | ICD-10-CM | POA: Diagnosis not present

## 2023-09-05 DIAGNOSIS — S81801D Unspecified open wound, right lower leg, subsequent encounter: Secondary | ICD-10-CM

## 2023-09-05 DIAGNOSIS — E559 Vitamin D deficiency, unspecified: Secondary | ICD-10-CM

## 2023-09-05 MED ORDER — ESCITALOPRAM OXALATE 10 MG PO TABS: 10.0000 mg | ORAL_TABLET | Freq: Every day | ORAL | 3 refills | Status: AC

## 2023-09-05 NOTE — Telephone Encounter (Signed)
Okay this has been noted.

## 2023-09-05 NOTE — Progress Notes (Unsigned)
 Patient ID: [name], female   DOB: 08-Jul-1935, 88 y.o.   MRN: [id number]        Chief Complaint: follow up right leg laceration after stitches x 8 days       HPI:  [name] is a 88 y.o. female here after accidental fall of drawer on the distal anterior right leg seen in ED 4 24 with several stitches placed.  Has already had Tdap about 6  mo after skin tear to right arm.  Pt denies chest pain, increased sob or doe, wheezing, orthopnea, PND, increased LE swelling, palpitations, dizziness or syncope.   Pt denies polydipsia, polyuria, or new focal neuro s/s.    Pt denies fever, wt loss, night sweats, loss of appetite, or other constitutional symptoms  Has had mild worsening depressive symptoms, but no suicidal ideation, or panic; asks to restart the lexapro  10 mg    Wt Readings from Last 3 Encounters:  09/05/23 209 lb (94.8 kg)  07/07/23 224 lb 1.6 oz (101.7 kg)  04/18/23 215 lb 3.2 oz (97.6 kg)   BP Readings from Last 3 Encounters:  09/05/23 122/80  08/28/23 (!) 126/91  07/07/23 130/72         Past Medical History:  Diagnosis Date   Allergy     Anxiety    Arthritis    no cartlidge in knees   Asthma    Bradycardia    a. atenolol  stopped due to this, HR 40s.   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  CAD s/p RCA stent 2000 with residual mild-mod disease of left system 2001.   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 Her smoking use included cigarettes. She started smoking about 64 years ago. She has a 4 pack-year smoking history. She has never used smokeless tobacco. She reports that she does not drink alcohol and does not use drugs. family history includes Asthma in her sister; Heart attack in her brother and father; Heart disease in her brother and father; Rectal cancer in her father. Allergies  Allergen Reactions   Crestor  [Rosuvastatin  Calcium ]     Severe liver function problems   Hydrocodone -Acetaminophen  Anxiety and Itching   Lidocaine  Hcl Other (See Comments)    Novacaine:  Becomes very shaky   Lipitor [Atorvastatin  Calcium ] Other (See Comments)    Elevated liver function    Procaine Other (See Comments)  shaking   Rosuvastatin  Other (See Comments)    Severe liver function problems   Theophylline Other (See Comments)    Becomes very shaky skaking skaking   Vicodin [Hydrocodone -Acetaminophen ] Itching    Itching all over   Codeine Nausea Only, Anxiety and Other (See Comments)    Also complained of dizziness.  Has same problems with oxycodone and hydrocodone  Visual Disturbance, Balance Difficulty, Dizzy "Room Spinning; "Swimming" Balance, vision, nausea, dizzy, "swimming", "room spinning" Balance, vision, nausea, dizzy, "swimming", "room spinning"   Ephedrine Other (See Comments)    shaking   Other Other (See Comments)    Quinine causes nausea, dizzy   Oxycodone Other (See Comments)    Balance, vision, nausea, dizzy, "swimming", "room spinning"   Oxycodone-Acetaminophen  Nausea Only, Anxiety and Other (See  Comments)    Visual Disturbance, Balance Difficulty, Dizzy "Room Spinning; "Swimming"   Propoxyphene Anxiety, Nausea Only and Other (See Comments)    Visual Disturbance, Balance Difficulty, Dizzy "Room Spinning; "Swimming" Balance, vision, nausea, dizzy, "swimming", "room spinning" Balance, vision, nausea, dizzy, "swimming, "room spinning"   Quinine Derivatives Nausea Only    dizzy   Sulfa  Antibiotics Nausea Only    Also complained of dizziness   Sulfur  Other (See Comments)   Epinephrine  Other (See Comments)    Patient becomes "shaky" shaking shaking   Penicillins Rash    Pt reported all over body rash in 1958 Has patient had a PCN reaction causing immediate rash, facial/tongue/throat swelling, SOB or lightheadedness with hypotension:Yes Has patient had a PCN reaction causing severe rash involving mucus membranes or skin necrosis: No Has patient had a PCN reaction that required hospitalization: No Has patient had a PCN reaction occurring within the last 10 years:No     Quinine Other (See Comments)   Theophyllines Other (See Comments)    Patient becomes "shaky"   Current Outpatient Medications on File Prior to Visit  Medication Sig Dispense Refill   albuterol  (PROVENTIL ) (2.5 MG/3ML) 0.083% nebulizer solution Take 3 mLs (2.5 mg total) by nebulization every 6 (six) hours as needed for wheezing or shortness of breath. 75 mL 12   albuterol  (VENTOLIN  HFA) 108 (90 Base) MCG/ACT inhaler Inhale 2 puffs into the lungs every 4 (four) hours as needed for wheezing or shortness of breath. 1 each 0   ALLERGY  RELIEF 180 MG tablet TAKE 1 TABLET(180 MG) BY MOUTH DAILY 90 tablet 5   allopurinol  (ZYLOPRIM ) 300 MG tablet Take 300 mg by mouth daily.     ALPRAZolam  (XANAX ) 0.5 MG tablet TAKE 1 TO 2 TABLETS BY MOUTH TWICE DAILY AS NEEDED FOR ANXIETY OR SLEEP 60 tablet 2   AMBULATORY NON FORMULARY MEDICATION Medication Name: Prednisone  Injection in knee     azelastine  (ASTELIN ) 0.1 % nasal spray Place 2  sprays into both nostrils daily. Use in each nostril as directed (Patient taking differently: Place 2 sprays into both nostrils in the morning.) 30 mL 5   bacitracin  ointment Apply 1 Application topically daily. With dressing changes 120 g 0   benzonatate  (TESSALON ) 100 MG capsule Take 1 capsule (100 mg total) by mouth 3 (three) times daily as needed for cough. 21 capsule 0   Biotin 10 MG TABS 1 tablet Orally Once a day     bisacodyl  (DULCOLAX) 10 MG suppository Place 1 suppository (10 mg total) rectally as needed for moderate constipation. 12 suppository 0   budesonide -formoterol  (SYMBICORT ) 80-4.5 MCG/ACT inhaler 2 puffs Inhalation Twice a day     co-enzyme Q-10 50 MG capsule Take  100 mg by mouth daily.     colchicine 0.6 MG tablet Take 0.6 mg by mouth daily as needed (flare  up).     cycloSPORINE  (RESTASIS ) 0.05 % ophthalmic emulsion Apply 1 drop to eye 2 (two) times daily as needed (allergies).     EPINEPHrine  0.3 mg/0.3 mL IJ SOAJ injection Inject 0.3 mg into the muscle as needed for anaphylaxis.     Evolocumab  (REPATHA  SURECLICK) 140 MG/ML SOAJ Inject 140 mg into the skin every 14 (fourteen) days. 6 mL 3   ezetimibe  (ZETIA ) 10 MG tablet TAKE 1 TABLET BY MOUTH EVERY DAY. TAKE ON TUESDAY AND FRIDAY 90 tablet 3   fluticasone  (FLONASE ) 50 MCG/ACT nasal spray Place 1 spray into both nostrils every morning.     fluticasone -salmeterol (ADVAIR DISKUS) 500-50 MCG/ACT AEPB 1 puff Inhalation Twice a day     fluticasone -salmeterol (ADVAIR) 500-50 MCG/ACT AEPB Inhale 1 puff into the lungs in the morning and at bedtime.     furosemide  (LASIX ) 40 MG tablet 1 tablet Orally Once a day for 30 day(s)     gabapentin  (NEURONTIN ) 300 MG capsule TAKE 1 CAPSULE(300 MG) BY MOUTH AT BEDTIME 90 capsule 1   Homeopathic Products (ARNICARE) GEL Apply 1 application  topically daily as needed (soreness).     hydrochlorothiazide  (HYDRODIURIL ) 25 MG tablet TAKE 2 TABLETS(50 MG) BY MOUTH DAILY 180 tablet 1   ipratropium  (ATROVENT ) 0.03 % nasal spray Place 2 sprays into both nostrils every 12 (twelve) hours as needed for rhinitis. 30 mL 11   levalbuterol  (XOPENEX  HFA) 45 MCG/ACT inhaler Inhale 2 puffs into the lungs every 6 (six) hours as needed for wheezing or shortness of breath. 3 Inhaler 3   levocetirizine (XYZAL ) 5 MG tablet Take 1 tablet (5 mg total) by mouth every evening. 30 tablet 11   levothyroxine  (SYNTHROID ) 75 MCG tablet TAKE 1 TABLET(75 MCG) BY MOUTH DAILY BEFORE BREAKFAST 90 tablet 1   metoprolol  succinate (TOPROL -XL) 25 MG 24 hr tablet TAKE 1/2 TABLET BY MOUTH EVERY DAY 45 tablet 2   mirtazapine  (REMERON ) 15 MG tablet Take 15 mg by mouth at bedtime.     montelukast  (SINGULAIR ) 10 MG tablet Take 1 tablet (10 mg total) by mouth at bedtime. 90 tablet 1   mupirocin  ointment (BACTROBAN ) 2 % Apply 1 Application topically 2 (two) times daily. 22 g 0   nystatin cream (MYCOSTATIN) Apply topically.     Omeprazole Magnesium (PRILOSEC) 10 MG PACK 1 packet mixed with 15 ml of water Orally Once a day for 30 day(s)     ondansetron  (ZOFRAN ) 4 MG tablet Take 1 tablet (4 mg total) by mouth every 8 (eight) hours as needed for nausea or vomiting. 40 tablet 1   pantoprazole  (PROTONIX ) 40 MG tablet TAKE 1 TABLET(40 MG) BY MOUTH DAILY 30 tablet 2   potassium chloride  SA (KLOR-CON  M) 20 MEQ tablet TAKE 1 TABLET BY MOUTH DAILY 90 tablet 3   rosuvastatin  (CRESTOR ) 10 MG tablet Take 1 tablet (10 mg total) by mouth 2 (two) times a week. 25 tablet 3   traMADol  (ULTRAM ) 50 MG tablet Take 0.5-1 tablets (25-50 mg total) by mouth every 6 (six) hours as needed. 120 tablet 2   triamcinolone  cream (KENALOG ) 0.1 % Apply 1 Application topically 2 (two) times daily. 30 g 1   valACYclovir  (VALTREX ) 1000 MG tablet Take 2,000 mg by mouth 2 (two) times daily.     XARELTO  20 MG TABS tablet TAKE 1 TABLET(20 MG) BY MOUTH DAILY  WITH SUPPER 90 tablet 1   Zinc  50 MG TABS 1 tablet Orally Once a day     No current facility-administered medications  on file prior to visit.        ROS:  All others reviewed and negative.  Objective        PE:  BP 122/80 (BP Location: Right Arm, Patient Position: Sitting, Cuff Size: Normal)   Pulse 72   Temp 98.5 F (36.9 C) (Oral)   Ht 5\' 4"  (1.626 m)   Wt 209 lb (94.8 kg)   SpO2 97%   BMI 35.87 kg/m                 Constitutional: Pt appears in NAD               HENT: Head: NCAT.                Right Ear: External ear normal.                 Left Ear: External ear normal.                Eyes: . Pupils are equal, round, and reactive to light. Conjunctivae and EOM are normal               Nose: without d/c or deformity               Neck: Neck supple. Gross normal ROM               Cardiovascular: Normal rate and regular rhythm.                 Pulmonary/Chest: Effort normal and breath sounds without rales or wheezing.                Abd:  Soft, NT, ND, + BS, no organomegaly               Neurological: Pt is alert. At baseline orientation, motor grossly intact               Skin: Skin is warm.  LE edema - none, right pretibial leg wound now with stitches, intact, no bleeding and no worsening redness swelling, red streaks or abscess               Psychiatric: Pt behavior is normal without agitation , mild depressed affect  Micro: none  Cardiac tracings I have personally interpreted today:  none  Pertinent Radiological findings (summarize): none   Lab Results  Component Value Date   WBC 7.2 02/11/2023   HGB 13.7 02/11/2023   HCT 42.4 02/11/2023   PLT 130.0 (L) 02/11/2023   GLUCOSE 112 (H) 02/11/2023   CHOL 198 11/25/2022   TRIG 71.0 11/25/2022   HDL 70.80 11/25/2022   LDLDIRECT 107.0 09/17/2016   LDLCALC 113 (H) 11/25/2022   ALT 13 02/11/2023   AST 21 02/11/2023   NA 140 02/11/2023   K 3.9 02/11/2023   CL 107 02/11/2023   CREATININE 1.05 02/11/2023   BUN 19 02/11/2023   CO2 21 02/11/2023   TSH 2.15 11/25/2022   INR 1.8 (H) 02/11/2023   HGBA1C 5.6 11/25/2022   MICROALBUR 1.7  05/21/2022   Assessment/Plan:  [name] is a 88 y.o. 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  Prediabetes Lab Results  Component Value Date   HGBA1C 5.6 11/25/2022   Stable, pt to continue current medical treatment  - diet,w t control   Mixed hyperlipidemia Lab Results  Component Value Date   LDLCALC 113 (H) 11/25/2022   uncontrolled, pt to continue current statin zetia  10 mg every day, crestor  10 mg which is the most she can tolerate; for f/u lipid and consider change to repatha    Vitamin D  deficiency Last vitamin D  Lab Results  Component Value Date   VD25OH 39.32 11/25/2022   Low, to start oral replacement   Leg wound, right Intact and healing nicely but deep to start, will cont current bacitracin  topical, covering and rov 1 wk for stitches out  Anxiety and depression With mild worsening, for restart lexapro  10 mg qd  Followup: Return in about 1 week (around 09/12/2023).  Rosalia Colonel, MD 09/06/2023 9:31 AM [facility]

## 2023-09-05 NOTE — Telephone Encounter (Unsigned)
 Copied from CRM [id number]. Topic: General - Other >> Sep 05, 2023 11:26 AM Bambi Bonine D wrote: Reason for CRM: Patient stated that she had an appointment with Dr.John today and was advised to go to the lab office for testing. Patient stated that she couldn't make it today and will be there on Monday to do the labs.

## 2023-09-05 NOTE — Patient Instructions (Signed)
 Ok to continue the bacitracin  and covering for 1  more week  Please take all new medication as prescribed - the lexapro  10 mg per day  Please continue all other medications as before, and refills have been done if requested.  Please have the pharmacy call with any other refills you may need.  Please continue your efforts at being more active, low cholesterol diet, and weight control.  Please keep your appointments with your specialists as you may have planned  Please go to the LAB at the blood drawing area for the tests to be done  You will be contacted by phone if any changes need to be made immediately.  Otherwise, you will receive a letter about your results with an explanation, but please check with MyChart first.  Please make an Appointment to return in 1 week for stitches out and go over labs, or sooner if needed

## 2023-09-06 ENCOUNTER — Encounter: Payer: Self-pay | Admitting: Internal Medicine

## 2023-09-06 DIAGNOSIS — S81801A Unspecified open wound, right lower leg, initial encounter: Secondary | ICD-10-CM | POA: Insufficient documentation

## 2023-09-06 NOTE — Assessment & Plan Note (Signed)
 With mild worsening, for restart lexapro  10 mg qd

## 2023-09-06 NOTE — Assessment & Plan Note (Signed)
 Intact and healing nicely but deep to start, will cont current bacitracin  topical, covering and rov 1 wk for stitches out

## 2023-09-06 NOTE — Assessment & Plan Note (Signed)
 Lab Results  Component Value Date   HGBA1C 5.6 11/25/2022   Stable, pt to continue current medical treatment  - diet, wt control

## 2023-09-06 NOTE — Assessment & Plan Note (Signed)
Last vitamin D Lab Results  Component Value Date   VD25OH 39.32 11/25/2022   Low, to start oral replacement

## 2023-09-06 NOTE — Assessment & Plan Note (Signed)
 Lab Results  Component Value Date   LDLCALC 113 (H) 11/25/2022   uncontrolled, pt to continue current statin zetia  10 mg every day, crestor  10 mg which is the most she can tolerate; for f/u lipid and consider change to repatha 

## 2023-09-09 ENCOUNTER — Other Ambulatory Visit (INDEPENDENT_AMBULATORY_CARE_PROVIDER_SITE_OTHER)

## 2023-09-09 DIAGNOSIS — R7303 Prediabetes: Secondary | ICD-10-CM | POA: Diagnosis not present

## 2023-09-09 DIAGNOSIS — E559 Vitamin D deficiency, unspecified: Secondary | ICD-10-CM

## 2023-09-09 DIAGNOSIS — E782 Mixed hyperlipidemia: Secondary | ICD-10-CM | POA: Diagnosis not present

## 2023-09-09 DIAGNOSIS — E538 Deficiency of other specified B group vitamins: Secondary | ICD-10-CM

## 2023-09-10 LAB — CBC WITH DIFFERENTIAL/PLATELET
Basophils Absolute: 0.1 10*3/uL (ref 0.0–0.1)
Basophils Relative: 1 % (ref 0.0–3.0)
Eosinophils Absolute: 0.2 10*3/uL (ref 0.0–0.7)
Eosinophils Relative: 3.7 % (ref 0.0–5.0)
HCT: 42.3 % (ref 36.0–46.0)
Hemoglobin: 13.8 g/dL (ref 12.0–15.0)
Lymphocytes Relative: 21.4 % (ref 12.0–46.0)
Lymphs Abs: 1.3 10*3/uL (ref 0.7–4.0)
MCHC: 32.7 g/dL (ref 30.0–36.0)
MCV: 100.7 fl — ABNORMAL HIGH (ref 78.0–100.0)
Monocytes Absolute: 0.6 10*3/uL (ref 0.1–1.0)
Monocytes Relative: 9 % (ref 3.0–12.0)
Neutro Abs: 4.1 10*3/uL (ref 1.4–7.7)
Neutrophils Relative %: 64.9 % (ref 43.0–77.0)
Platelets: 151 10*3/uL (ref 150.0–400.0)
RBC: 4.2 Mil/uL (ref 3.87–5.11)
RDW: 14.8 % (ref 11.5–15.5)
WBC: 6.3 10*3/uL (ref 4.0–10.5)

## 2023-09-10 LAB — BASIC METABOLIC PANEL WITH GFR
BUN: 23 mg/dL (ref 6–23)
CO2: 28 meq/L (ref 19–32)
Calcium: 10 mg/dL (ref 8.4–10.5)
Chloride: 105 meq/L (ref 96–112)
Creatinine, Ser: 1.02 mg/dL (ref 0.40–1.20)
GFR: 49.98 mL/min — ABNORMAL LOW (ref >60.00)
Glucose, Bld: 94 mg/dL (ref 70–99)
Potassium: 3.6 meq/L (ref 3.5–5.1)
Sodium: 141 meq/L (ref 135–145)

## 2023-09-10 LAB — LIPID PANEL
Cholesterol: 157 mg/dL (ref 0–200)
HDL: 57.6 mg/dL (ref >39.00)
LDL Cholesterol: 83 mg/dL (ref 0–99)
NonHDL: 99.51
Total CHOL/HDL Ratio: 3
Triglycerides: 81 mg/dL (ref 0.0–149.0)
VLDL: 16.2 mg/dL (ref 0.0–40.0)

## 2023-09-10 LAB — HEPATIC FUNCTION PANEL
ALT: 12 U/L (ref 0–35)
AST: 22 U/L (ref 0–37)
Albumin: 4.2 g/dL (ref 3.5–5.2)
Alkaline Phosphatase: 67 U/L (ref 39–117)
Bilirubin, Direct: 0.2 mg/dL (ref 0.0–0.3)
Total Bilirubin: 1.3 mg/dL — ABNORMAL HIGH (ref 0.2–1.2)
Total Protein: 6.7 g/dL (ref 6.0–8.3)

## 2023-09-10 LAB — VITAMIN B12: Vitamin B-12: 393 pg/mL (ref 211–911)

## 2023-09-10 LAB — URINALYSIS, ROUTINE W REFLEX MICROSCOPIC
Bilirubin Urine: NEGATIVE
Hgb urine dipstick: NEGATIVE
Ketones, ur: NEGATIVE
Nitrite: POSITIVE — AB
Specific Gravity, Urine: 1.025 (ref 1.000–1.030)
Total Protein, Urine: NEGATIVE
Urine Glucose: NEGATIVE
Urobilinogen, UA: 1 (ref 0.0–1.0)
pH: 6 (ref 5.0–8.0)

## 2023-09-10 LAB — MICROALBUMIN / CREATININE URINE RATIO
Creatinine,U: 173.4 mg/dL
Microalb Creat Ratio: 8.9 mg/g (ref 0.0–30.0)
Microalb, Ur: 1.5 mg/dL (ref 0.0–1.9)

## 2023-09-10 LAB — TSH: TSH: 2.16 u[IU]/mL (ref 0.35–5.50)

## 2023-09-10 LAB — VITAMIN D 25 HYDROXY (VIT D DEFICIENCY, FRACTURES): VITD: 50.84 ng/mL (ref 30.00–100.00)

## 2023-09-10 LAB — HEMOGLOBIN A1C: Hgb A1c MFr Bld: 5.6 % (ref 4.6–6.5)

## 2023-09-12 ENCOUNTER — Encounter: Payer: Self-pay | Admitting: Internal Medicine

## 2023-09-12 ENCOUNTER — Ambulatory Visit (INDEPENDENT_AMBULATORY_CARE_PROVIDER_SITE_OTHER): Admitting: Internal Medicine

## 2023-09-12 VITALS — BP 128/78 | HR 59 | Temp 98.0°F | Ht 64.0 in | Wt 207.0 lb

## 2023-09-12 DIAGNOSIS — S81801D Unspecified open wound, right lower leg, subsequent encounter: Secondary | ICD-10-CM | POA: Diagnosis not present

## 2023-09-12 DIAGNOSIS — E782 Mixed hyperlipidemia: Secondary | ICD-10-CM | POA: Diagnosis not present

## 2023-09-12 DIAGNOSIS — E559 Vitamin D deficiency, unspecified: Secondary | ICD-10-CM

## 2023-09-12 DIAGNOSIS — I10 Essential (primary) hypertension: Secondary | ICD-10-CM

## 2023-09-12 DIAGNOSIS — R7303 Prediabetes: Secondary | ICD-10-CM

## 2023-09-12 NOTE — Patient Instructions (Addendum)
 Your stitches were taken out today  Please continue all other medications as before, and refills have been done if requested.  Please have the pharmacy call with any other refills you may need.  Please continue your efforts at being more active, low cholesterol diet, and weight control..  Please keep your appointments with your specialists as you may have planned  Please make an Appointment to return in 6 months, or sooner if needed - and ok to cancel the June 16 visit

## 2023-09-12 NOTE — Assessment & Plan Note (Signed)
 Lab Results  Component Value Date   LDLCALC 83 09/09/2023   Mild uncontrolled, pt to continue current statin crestor  10 mg every day , declines increase due to myalgia, als continue repatha  140 mg biweekly

## 2023-09-12 NOTE — Assessment & Plan Note (Signed)
 Lab Results  Component Value Date   HGBA1C 5.6 09/09/2023   Stable, pt to continue current medical treatment  - diet, wt control

## 2023-09-12 NOTE — Progress Notes (Signed)
 Patient ID: [name], female   DOB: 08/07/1935, 88 y.o.   MRN: [id number]        Chief Complaint: follow up right leg wound for stitch removal, hrn, preDM, hld, low vit d       HPI:  [name] is a 88 y.o. female here overall having done quite well this past wk, and leg has contd to heal, now appears intact without bleeding and no s/s cellulitis.  Pt denies chest pain, increased sob or doe, wheezing, orthopnea, PND, increased LE swelling, palpitations, dizziness or syncope.   Pt denies polydipsia, polyuria, or new focal neuro s/s.    Pt denies fever, wt loss, night sweats, loss of appetite, or other constitutional symptoms         Wt Readings from Last 3 Encounters:  09/12/23 207 lb (93.9 kg)  09/05/23 209 lb (94.8 kg)  07/07/23 224 lb 1.6 oz (101.7 kg)   BP Readings from Last 3 Encounters:  09/12/23 128/78  09/05/23 122/80  08/28/23 (!) 126/91         Past Medical History:  Diagnosis Date   Allergy     Anxiety    Arthritis    no cartlidge in knees   Asthma    Bradycardia    a. atenolol  stopped due to this, HR 40s.   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  CAD s/p RCA stent 2000 with residual mild-mod disease of left system 2001.   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 Her smoking use included cigarettes. She started smoking about 64 years ago. She has a 4 pack-year smoking history. She has never used smokeless tobacco. She reports that she does not drink alcohol and does not use drugs. family history includes Asthma in her sister; Heart attack in her brother and father; Heart disease in her brother and father; Rectal cancer in her father. Allergies  Allergen Reactions   Crestor  [Rosuvastatin  Calcium ]     Severe liver function problems   Hydrocodone -Acetaminophen  Anxiety and Itching   Lidocaine  Hcl Other (See Comments)    Novacaine:  Becomes very shaky   Lipitor [Atorvastatin  Calcium ] Other (See Comments)    Elevated liver function    Procaine Other (See Comments)    shaking   Rosuvastatin  Other (See Comments)    Severe liver function problems   Theophylline Other (See Comments)  Becomes very shaky skaking skaking   Vicodin [Hydrocodone -Acetaminophen ] Itching    Itching all over   Codeine Nausea Only, Anxiety and Other (See Comments)    Also complained of dizziness.  Has same problems with oxycodone and hydrocodone  Visual Disturbance, Balance Difficulty, Dizzy "Room Spinning; "Swimming" Balance, vision, nausea, dizzy, "swimming", "room spinning" Balance, vision, nausea, dizzy, "swimming", "room spinning"   Ephedrine Other (See Comments)    shaking   Other Other (See Comments)    Quinine causes nausea, dizzy   Oxycodone Other (See Comments)    Balance, vision, nausea, dizzy, "swimming", "room spinning"   Oxycodone-Acetaminophen  Nausea Only, Anxiety and Other (See Comments)    Visual Disturbance, Balance Difficulty, Dizzy "Room Spinning; "Swimming"   Propoxyphene Anxiety, Nausea Only and Other (See Comments)     Visual Disturbance, Balance Difficulty, Dizzy "Room Spinning; "Swimming" Balance, vision, nausea, dizzy, "swimming", "room spinning" Balance, vision, nausea, dizzy, "swimming, "room spinning"   Quinine Derivatives Nausea Only    dizzy   Sulfa  Antibiotics Nausea Only    Also complained of dizziness   Sulfur  Other (See Comments)   Epinephrine  Other (See Comments)    Patient becomes "shaky" shaking shaking   Penicillins Rash    Pt reported all over body rash in 1958 Has patient had a PCN reaction causing immediate rash, facial/tongue/throat swelling, SOB or lightheadedness with hypotension:Yes Has patient had a PCN reaction causing severe rash involving mucus membranes or skin necrosis: No Has patient had a PCN reaction that required hospitalization: No Has patient had a PCN reaction occurring within the last 10 years:No     Quinine Other (See Comments)   Theophyllines Other (See Comments)    Patient becomes "shaky"   Current Outpatient Medications on File Prior to Visit  Medication Sig Dispense Refill   albuterol  (PROVENTIL ) (2.5 MG/3ML) 0.083% nebulizer solution Take 3 mLs (2.5 mg total) by nebulization every 6 (six) hours as needed for wheezing or shortness of breath. 75 mL 12   albuterol  (VENTOLIN  HFA) 108 (90 Base) MCG/ACT inhaler Inhale 2 puffs into the lungs every 4 (four) hours as needed for wheezing or shortness of breath. 1 each 0   ALLERGY  RELIEF 180 MG tablet TAKE 1 TABLET(180 MG) BY MOUTH DAILY 90 tablet 5   allopurinol  (ZYLOPRIM ) 300 MG tablet Take 300 mg by mouth daily.     ALPRAZolam  (XANAX ) 0.5 MG tablet TAKE 1 TO 2 TABLETS BY MOUTH TWICE DAILY AS NEEDED FOR ANXIETY OR SLEEP 60 tablet 2   AMBULATORY NON FORMULARY MEDICATION Medication Name: Prednisone  Injection in knee     azelastine  (ASTELIN ) 0.1 % nasal spray Place 2 sprays into both nostrils daily. Use in each nostril as directed (Patient taking differently: Place 2 sprays into both nostrils in the morning.) 30 mL 5    bacitracin  ointment Apply 1 Application topically daily. With dressing changes 120 g 0   benzonatate  (TESSALON ) 100 MG capsule Take 1 capsule (100 mg total) by mouth 3 (three) times daily as needed for cough. 21 capsule 0   Biotin 10 MG TABS 1 tablet Orally Once a day     bisacodyl  (DULCOLAX) 10 MG suppository Place 1 suppository (10 mg total) rectally as needed for moderate constipation. 12 suppository 0   budesonide -formoterol  (SYMBICORT ) 80-4.5 MCG/ACT inhaler 2 puffs Inhalation Twice a day     co-enzyme Q-10 50 MG capsule Take 100 mg by mouth daily.     colchicine 0.6 MG tablet Take 0.6 mg by mouth daily as needed (flare  up).     cycloSPORINE  (RESTASIS ) 0.05 % ophthalmic emulsion Apply 1 drop to eye 2 (two) times daily as needed (allergies).     EPINEPHrine  0.3 mg/0.3 mL IJ SOAJ injection Inject 0.3 mg into the muscle as needed for anaphylaxis.     escitalopram  (LEXAPRO ) 10 MG tablet Take 1 tablet (10 mg total) by mouth daily. 90 tablet 3   Evolocumab  (REPATHA  SURECLICK) 140 MG/ML SOAJ Inject 140 mg into the skin every 14 (fourteen) days. 6 mL 3   ezetimibe  (ZETIA ) 10 MG tablet TAKE 1 TABLET BY MOUTH EVERY DAY. TAKE ON TUESDAY AND FRIDAY 90 tablet 3   fluticasone  (FLONASE ) 50 MCG/ACT nasal spray Place 1 spray into both nostrils every morning.     fluticasone -salmeterol (ADVAIR DISKUS) 500-50 MCG/ACT AEPB 1 puff Inhalation Twice a day     fluticasone -salmeterol (ADVAIR) 500-50 MCG/ACT AEPB Inhale 1 puff into the lungs in the morning and at bedtime.     furosemide  (LASIX ) 40 MG tablet 1 tablet Orally Once a day for 30 day(s)     gabapentin  (NEURONTIN ) 300 MG capsule TAKE 1 CAPSULE(300 MG) BY MOUTH AT BEDTIME 90 capsule 1   Homeopathic Products (ARNICARE) GEL Apply 1 application  topically daily as needed (soreness).     hydrochlorothiazide  (HYDRODIURIL ) 25 MG tablet TAKE 2 TABLETS(50 MG) BY MOUTH DAILY 180 tablet 1   ipratropium (ATROVENT ) 0.03 % nasal spray Place 2 sprays into both nostrils  every 12 (twelve) hours as needed for rhinitis. 30 mL 11   levalbuterol  (XOPENEX  HFA) 45 MCG/ACT inhaler Inhale 2 puffs into the lungs every 6 (six) hours as needed for wheezing or shortness of breath. 3 Inhaler 3   levocetirizine (XYZAL ) 5 MG tablet Take 1 tablet (5 mg total) by mouth every evening. 30 tablet 11   levothyroxine  (SYNTHROID ) 75 MCG tablet TAKE 1 TABLET(75 MCG) BY MOUTH DAILY BEFORE BREAKFAST 90 tablet 1   metoprolol  succinate (TOPROL -XL) 25 MG 24 hr tablet TAKE 1/2 TABLET BY MOUTH EVERY DAY 45 tablet 2   mirtazapine  (REMERON ) 15 MG tablet Take 15 mg by mouth at bedtime.     montelukast  (SINGULAIR ) 10 MG tablet Take 1 tablet (10 mg total) by mouth at bedtime. 90 tablet 1   mupirocin  ointment (BACTROBAN ) 2 % Apply 1 Application topically 2 (two) times daily. 22 g 0   nystatin cream (MYCOSTATIN) Apply topically.     Omeprazole Magnesium (PRILOSEC) 10 MG PACK 1 packet mixed with 15 ml of water Orally Once a day for 30 day(s)     ondansetron  (ZOFRAN ) 4 MG tablet Take 1 tablet (4 mg total) by mouth every 8 (eight) hours as needed for nausea or vomiting. 40 tablet 1   pantoprazole  (PROTONIX ) 40 MG tablet TAKE 1 TABLET(40 MG) BY MOUTH DAILY 30 tablet 2   potassium chloride  SA (KLOR-CON  M) 20 MEQ tablet TAKE 1 TABLET BY MOUTH DAILY 90 tablet 3   rosuvastatin  (CRESTOR ) 10 MG tablet Take 1 tablet (10 mg total) by mouth 2 (two) times a week. 25 tablet 3   traMADol  (ULTRAM ) 50 MG tablet Take 0.5-1 tablets (25-50 mg total) by mouth every 6 (six) hours as needed. 120 tablet 2   triamcinolone  cream (KENALOG ) 0.1 % Apply 1 Application topically 2 (two) times daily. 30 g 1   valACYclovir  (VALTREX ) 1000 MG tablet Take 2,000 mg by mouth 2 (two) times daily.     XARELTO  20 MG TABS tablet TAKE 1 TABLET(20 MG) BY MOUTH DAILY WITH SUPPER 90 tablet  1   Zinc  50 MG TABS 1 tablet Orally Once a day     No current facility-administered medications on file prior to visit.        ROS:  All others reviewed and  negative.  Objective        PE:  BP 128/78 (BP Location: Left Arm, Patient Position: Sitting, Cuff Size: Normal)   Pulse (!) 59   Temp 98 F (36.7 C) (Oral)   Ht 5\' 4"  (1.626 m)   Wt 207 lb (93.9 kg)   SpO2 98%   BMI 35.53 kg/m                 Constitutional: Pt appears in NAD               HENT: Head: NCAT.                Right Ear: External ear normal.                 Left Ear: External ear normal.                Eyes: . Pupils are equal, round, and reactive to light. Conjunctivae and EOM are normal               Nose: without d/c or deformity               Neck: Neck supple. Gross normal ROM               Cardiovascular: Normal rate and regular rhythm.                 Pulmonary/Chest: Effort normal and breath sounds without rales or wheezing               Neurological: Pt is alert. At baseline orientation, motor grossly intact               Skin: Skin is warm. No rashes, LE edema - none, right leg wound intact without bleeding or cellulitis               Psychiatric: Pt behavior is normal without agitation   Micro: none  Cardiac tracings I have personally interpreted today:  none  Pertinent Radiological findings (summarize): none   Lab Results  Component Value Date   WBC 6.3 09/09/2023   HGB 13.8 09/09/2023   HCT 42.3 09/09/2023   PLT 151.0 09/09/2023   GLUCOSE 94 09/09/2023   CHOL 157 09/09/2023   TRIG 81.0 09/09/2023   HDL 57.60 09/09/2023   LDLDIRECT 107.0 09/17/2016   LDLCALC 83 09/09/2023   ALT 12 09/09/2023   AST 22 09/09/2023   NA 141 09/09/2023   K 3.6 09/09/2023   CL 105 09/09/2023   CREATININE 1.02 09/09/2023   BUN 23 09/09/2023   CO2 28 09/09/2023   TSH 2.16 09/09/2023   INR 1.8 (H) 02/11/2023   HGBA1C 5.6 09/09/2023   MICROALBUR 1.5 09/09/2023   Assessment/Plan:  [name] is a 88 y.o. 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  Essential hypertension BP  Readings from Last 3 Encounters:  09/12/23 128/78  09/05/23 122/80  08/28/23 (!) 126/91   Stable, pt to continue medical treatment hct 50 every day, toprol  xl 37.5 qd   Prediabetes Lab Results  Component Value Date   HGBA1C 5.6 09/09/2023   Stable, pt to continue current medical treatment  - diet, wt control   Mixed hyperlipidemia Lab Results  Component Value Date   LDLCALC 83 09/09/2023   Mild uncontrolled, pt to continue current statin crestor  10 mg every day , declines increase due to myalgia, als continue repatha  140 mg biweekly   Vitamin D  deficiency Last vitamin D  Lab Results  Component Value Date   VD25OH 50.84 09/09/2023   Stable, cont oral replacement   Leg wound, right Wound now nicely intact and healing without redness, swelling - for stitch removal today  Followup: Return in about 6 months (around 03/14/2024).  Rosalia Colonel, MD 09/12/2023 10:07 AM [facility]

## 2023-09-12 NOTE — Assessment & Plan Note (Signed)
 BP Readings from Last 3 Encounters:  09/12/23 128/78  09/05/23 122/80  08/28/23 (!) 126/91   Stable, pt to continue medical treatment hct 50 every day, toprol  xl 37.5 qd

## 2023-09-12 NOTE — Assessment & Plan Note (Signed)
 Last vitamin D  Lab Results  Component Value Date   VD25OH 50.84 09/09/2023   Stable, cont oral replacement

## 2023-09-12 NOTE — Assessment & Plan Note (Signed)
 Wound now nicely intact and healing without redness, swelling - for stitch removal today

## 2023-09-19 ENCOUNTER — Encounter (HOSPITAL_COMMUNITY): Payer: Self-pay

## 2023-09-19 ENCOUNTER — Ambulatory Visit: Payer: Self-pay

## 2023-09-19 ENCOUNTER — Ambulatory Visit (HOSPITAL_COMMUNITY)
Admission: EM | Admit: 2023-09-19 | Discharge: 2023-09-19 | Disposition: A | Discharge status: Home / Self Care | Attending: Physician Assistant | Admitting: Physician Assistant

## 2023-09-19 DIAGNOSIS — L03115 Cellulitis of right lower limb: Secondary | ICD-10-CM | POA: Diagnosis not present

## 2023-09-19 MED ORDER — DOXYCYCLINE HYCLATE 100 MG PO CAPS: 100.0000 mg | ORAL_CAPSULE | Freq: Two times a day (BID) | ORAL | 0 refills | Status: DC

## 2023-09-19 MED ORDER — MUPIROCIN 2 % EX OINT: 1.0000 | TOPICAL_OINTMENT | Freq: Two times a day (BID) | CUTANEOUS | 0 refills | Status: DC

## 2023-09-19 NOTE — ED Triage Notes (Signed)
 Pt states she was scratched by her cat last night on her RLE. States she now has a rash on her leg. Red rash noted. Pt states she had sutures to the same leg as well.

## 2023-09-19 NOTE — Telephone Encounter (Signed)
  Chief Complaint: wound infection Symptoms: swelling, redness, irregular rash on R leg Frequency: yesterday Pertinent Negatives: Patient denies fever, pain (above baseline), itchyness Disposition: [] ED /[] Urgent Care (no appt availability in office) / [] Appointment(In office/virtual)/ []  [facility]/ [] Home Care/ [] Refused Recommended Disposition /[] [facility]/ [x]  Follow-up with PCP Additional Notes: Pt c/o wound infection on R leg. Pt reports had previous injury to leg that required stitches and pet cat recently scratched affected leg last night. Pt reports red, irregular rash and swelling to leg, but denies any itching or pain. Pt went to UC this AM and has been prescribed abx, but pt is inquiring about being treated for Cat-Scratch. Pt reports UC MD had mentioned tx, but did not want to prescribe Rx d/t possible interactions with her cardiac meds so deferred to PCP. Triager will forward encounter for Dr. Autry Legions 's office to review and advise. Triager did reinforce pt to take abx as prescribed from UC until further notice. Patient verbalized understanding and is expecting call back from office for next steps/if PCP would like to change abx regimen.   Of note, UC note has picture of affected leg.   Copied from CRM [id number]. Topic: Clinical - Red Word Triage >> Sep 19, 2023  1:28 PM Adonis Hoot wrote: Red Word that prompted transfer to Nurse Triage: red,prickly rash on leg,possibly cat scratch fever Reason for Disposition  [1] Caller has URGENT question AND [2] triager unable to answer question  Answer Assessment - Initial Assessment Questions 1. APPEARANCE of RASH: "Describe the rash."      Red, prickly rash, swelling - has been using bacitracin  Adopted wild cat-- has cat scratches on affected leg Went to UC this AM, and prescribed doxycycline . MD wanted give Rx for cat scratch fever, but MD did not prescribe d/t interactions with cardiac meds 2. LOCATION: "Where is the rash  located?"      R leg 3. NUMBER: "How many spots are there?"      One big rash 4. SIZE: "How big are the spots?" (Inches, centimeters or compare to size of a coin)      From below knee to ankle 5. ONSET: "When did the rash start?"      yesterday 6. ITCHING: "Does the rash itch?" If Yes, ask: "How bad is the itch?"  (Scale 0-10; or none, mild, moderate, severe)     denies 7. PAIN: "Does the rash hurt?" If Yes, ask: "How bad is the pain?"  (Scale 0-10; or none, mild, moderate, severe)    - NONE (0): no pain    - MILD (1-3): doesn't interfere with normal activities     - MODERATE (4-7): interferes with normal activities or awakens from sleep     - SEVERE (8-10): excruciating pain, unable to do any normal activities     Denies  8. OTHER SYMPTOMS: "Do you have any other symptoms?" (e.g., fever)     denies 9. PREGNANCY: "Is there any chance you are pregnant?" "When was your last menstrual period?"     N/a  Protocols used: Rash or Redness - Localized-A-AH, Wound Infection on Antibiotic Follow-up Call-A-AH

## 2023-09-19 NOTE — ED Provider Notes (Signed)
 [facility]    CSN: [id number] Arrival date & time: 09/19/23  1122      History   Chief Complaint Chief Complaint  Patient presents with   Wound Check    HPI [name] is a 88 y.o. female.   Patient presents today with a erythematous rash of her right lower leg following multiple cat scratches.  Reports that she adopted a stray cat and has been taking it to the vet and so it is up-to-date on its vaccines.  She reports that he has never bitten her but has been scratching her lower legs.  This has been ongoing for a few weeks but in the past few days she has developed pain and swelling of her lower leg.  Last month she dropped a drawer on her leg and had to have this sutured.  This has been healing appropriately.  She denies any recent antibiotics.  She denies any fever, inguinal lymphadenopathy, nausea, vomiting.  She has not been taking any over-the-counter medication for symptom management.    Past Medical History:  Diagnosis Date   Allergy     Anxiety    Arthritis    no cartlidge in knees   Asthma    Bradycardia    a. atenolol  stopped due to this, HR 40s.   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  CAD s/p RCA stent 2000 with residual mild-mod disease of left system 2001.   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   Osteoporosis 03/24/2015   Peripheral neuropathy 03/24/2015   Plantar fasciitis    Sleep apnea     Patient Active Problem List   Diagnosis Date Noted   Leg wound, right 09/06/2023   Closed nondisplaced fracture of second cervical vertebra (HCC) 09/05/2023   Acute renal failure  superimposed on chronic kidney disease (HCC) 12/16/2022   Cloudy urine 11/25/2022   Insomnia 07/27/2022   Vitamin D  deficiency 07/27/2022   Pulmonary hypertension (HCC) 11/07/2021   Bronchiectasis without complication (HCC) 11/07/2021   Hepatic cirrhosis (HCC) 11/06/2021   ILD (interstitial lung disease) (HCC) 10/03/2021   Abnormal LFTs 09/28/2021   Weight loss 08/19/2021   Chronic respiratory failure with hypoxia (HCC) 08/19/2021   Debility 06/29/2021   Elevated troponin 06/12/2021   Febrile illness 05/22/2021   Anxiety state 10/23/2020   Pure hypercholesterolemia 10/23/2020   Atherosclerosis of coronary artery 10/23/2020   Sinus node dysfunction (HCC) 10/23/2020   Mixed hyperlipidemia 10/23/2020   OSA on CPAP 10/23/2020   Fall 09/05/2020   Nausea 04/23/2020   Constipation 04/23/2020   Posterior uveitis, 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   Dysphagia 01/21/2020   CHF (congestive heart failure) (HCC) 11/23/2019   Osteoarthritis 09/19/2018   Whiplash 06/10/2017   Cough 05/29/2017   Concussion 05/29/2017   Hammer toes of both feet 12/19/2016   Allergic rhinitis 12/09/2016   Pedal edema 09/17/2016   Chest pain 07/18/2016   Gross hematuria 04/26/2016   Prediabetes 03/21/2016   Dysuria 12/07/2015   Choroidal nevus, 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   Malignant neoplasm of left female breast (HCC) 10/10/2015  Anxiety and depression 06/30/2015   Encounter for well adult exam with abnormal findings 06/30/2015   Hypothyroidism 03/24/2015   Gout 03/24/2015   Essential hypertension 03/24/2015   Asthma 03/24/2015   Peripheral neuropathy 03/24/2015   Osteoporosis 03/24/2015   Morbid obesity (HCC) 03/24/2015   Long term current use of anticoagulant therapy 03/16/2015   Lymphadenopathy, cervical 03/16/2015   Hyperbilirubinemia 03/16/2015   Coronary arteriosclerosis 09/22/2014   Hypokalemia 02/18/2013   Elevated bilirubin 02/18/2013   Morbid obesity with BMI of 45.0-49.9, adult (HCC) 10/29/2012    Rotator cuff tear arthropathy 10/29/2012   DJD (degenerative joint disease) of knee 10/29/2012   Arthritis    Hx of radiation therapy    Atrial fibrillation, chronic (HCC) 01/21/2011   Malignant neoplasm of upper-outer quadrant of left breast in female, 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      Home Medications    Prior to Admission medications   Medication Sig Start Date End Date Taking? Authorizing Provider  doxycycline  (VIBRAMYCIN ) 100 MG capsule Take 1 capsule (100 mg total) by mouth 2 (two) times daily. 09/19/23  Yes Jahyra Sukup K, PA-C  mupirocin  ointment (BACTROBAN ) 2 % Apply 1 Application topically 2 (two) times daily. 09/19/23  Yes Tabor Bartram K, PA-C  albuterol  (PROVENTIL ) (2.5 MG/3ML) 0.083% nebulizer solution Take 3 mLs (2.5 mg total) by nebulization every 6 (six) hours as needed for wheezing or shortness of breath. 10/12/21   Cobb, Mariah Shines, NP  albuterol  (VENTOLIN  HFA) 108 (90 Base) MCG/ACT inhaler Inhale 2 puffs into the lungs every 4 (four) hours as needed for wheezing or shortness of breath. 05/06/23   Shilee Keto, MD  ALLERGY  RELIEF 180 MG tablet TAKE 1 TABLET(180 MG) BY MOUTH DAILY 03/24/23   Denson Flake, MD  allopurinol  (ZYLOPRIM ) 300 MG tablet Take 300 mg by mouth daily.    [provider]  ALPRAZolam  (XANAX ) 0.5 MG tablet TAKE 1 TO 2 TABLETS BY MOUTH TWICE DAILY AS NEEDED FOR ANXIETY OR SLEEP 11/25/22   Roslyn Coombe, MD   AMBULATORY NON FORMULARY MEDICATION Medication Name: Prednisone  Injection in knee    [provider]  azelastine  (ASTELIN ) 0.1 % nasal spray Place 2 sprays into both nostrils daily. Use in each nostril as directed Patient taking differently: Place 2 sprays into both nostrils in the morning. 07/14/19   Roslyn Coombe, MD  bacitracin  ointment Apply 1 Application topically daily. With dressing changes 08/28/23   Lavada Porteous, DO  benzonatate  (TESSALON ) 100 MG capsule Take 1 capsule (100 mg total) by mouth 3 (three) times daily as needed for cough. 05/06/23   Banister, Pamela K, MD  Biotin 10 MG TABS 1 tablet Orally Once a day    [provider]  bisacodyl  (DULCOLAX) 10 MG suppository Place 1 suppository (10 mg total) rectally as needed for moderate constipation. 12/09/22  Corey, Evan S, MD  budesonide -formoterol  (SYMBICORT ) 80-4.5 MCG/ACT inhaler 2 puffs Inhalation Twice a day    [provider]  co-enzyme Q-10 50 MG capsule Take 100 mg by mouth daily.    [provider]  colchicine 0.6 MG tablet Take 0.6 mg by mouth daily as needed (flare  up).    [provider]  cycloSPORINE  (RESTASIS ) 0.05 % ophthalmic emulsion Apply 1 drop to eye 2 (two) times daily as needed (allergies).    [provider]  EPINEPHrine  0.3 mg/0.3 mL IJ SOAJ injection Inject 0.3 mg into the muscle as needed for anaphylaxis. 12/29/19   [provider]  escitalopram  (LEXAPRO ) 10 MG tablet Take 1 tablet (10 mg total) by mouth daily. 09/05/23 09/04/24  Roslyn Coombe, MD  Evolocumab  (REPATHA  SURECLICK) 140 MG/ML SOAJ Inject 140 mg into the skin every 14 (fourteen) days. 08/12/23   Swinyer, Leilani Punter, NP  ezetimibe  (ZETIA ) 10 MG tablet TAKE 1 TABLET BY MOUTH EVERY DAY. TAKE ON TUESDAY AND FRIDAY 08/18/23   Roslyn Coombe, MD  fluticasone  (FLONASE ) 50 MCG/ACT nasal spray Place 1 spray into both nostrils every morning.    [provider]  fluticasone -salmeterol (ADVAIR  DISKUS) 500-50 MCG/ACT AEPB 1 puff Inhalation Twice a day    [provider]  fluticasone -salmeterol (ADVAIR) 500-50 MCG/ACT AEPB Inhale 1 puff into the lungs in the morning and at bedtime.    [provider]  furosemide  (LASIX ) 40 MG tablet 1 tablet Orally Once a day for 30 day(s)    [provider]  gabapentin  (NEURONTIN ) 300 MG capsule TAKE 1 CAPSULE(300 MG) BY MOUTH AT BEDTIME 06/17/23   Roslyn Coombe, MD  Homeopathic Products (ARNICARE) GEL Apply 1 application  topically daily as needed (soreness).    [provider]  hydrochlorothiazide  (HYDRODIURIL ) 25 MG tablet TAKE 2 TABLETS(50 MG) BY MOUTH DAILY 05/09/23   Roslyn Coombe, MD  ipratropium (ATROVENT ) 0.03 % nasal spray Place 2 sprays into both nostrils every 12 (twelve) hours as needed for rhinitis. 04/18/23   Denson Flake, MD  levalbuterol  (XOPENEX  HFA) 45 MCG/ACT inhaler Inhale 2 puffs into the lungs every 6 (six) hours as needed for wheezing or shortness of breath. 09/30/19   Roslyn Coombe, MD  levocetirizine (XYZAL ) 5 MG tablet Take 1 tablet (5 mg total) by mouth every evening. 06/24/19   Roslyn Coombe, MD  levothyroxine  (SYNTHROID ) 75 MCG tablet TAKE 1 TABLET(75 MCG) BY MOUTH DAILY BEFORE BREAKFAST 06/25/23   Roslyn Coombe, MD  metoprolol  succinate (TOPROL -XL) 25 MG 24 hr tablet TAKE 1/2 TABLET BY MOUTH EVERY DAY 01/14/23   Lucendia Rusk, MD  mirtazapine  (REMERON ) 15 MG tablet Take 15 mg by mouth at bedtime. 08/23/23   [provider]  montelukast  (SINGULAIR ) 10 MG tablet Take 1 tablet (10 mg total) by mouth at bedtime. 07/01/21   Pokhrel, Laxman, MD  nystatin cream (MYCOSTATIN) Apply topically. 03/09/14   [provider]  Omeprazole Magnesium (PRILOSEC) 10 MG PACK 1 packet mixed with 15 ml of water Orally Once a day for 30 day(s)    [provider]  ondansetron  (ZOFRAN ) 4 MG tablet Take 1 tablet (4 mg total) by mouth every 8 (eight) hours as needed for nausea or vomiting.  06/07/22   Roslyn Coombe, MD  pantoprazole  (PROTONIX ) 40 MG tablet TAKE 1 TABLET(40 MG) BY MOUTH DAILY 08/14/23   Lajuan Pila, MD  potassium chloride  SA (KLOR-CON  M) 20 MEQ tablet TAKE 1  TABLET BY MOUTH DAILY 08/15/23   Roslyn Coombe, MD  rosuvastatin  (CRESTOR ) 10 MG tablet Take 1 tablet (10 mg total) by mouth 2 (two) times a week. 07/07/23   Swinyer, Leilani Punter, NP  traMADol  (ULTRAM ) 50 MG tablet Take 0.5-1 tablets (25-50 mg total) by mouth every 6 (six) hours as needed. 11/25/22   Roslyn Coombe, MD  triamcinolone  cream (KENALOG ) 0.1 % Apply 1 Application topically 2 (two) times daily. 04/17/23 04/16/24  Roslyn Coombe, MD  valACYclovir  (VALTREX ) 1000 MG tablet Take 2,000 mg by mouth 2 (two) times daily. 10/22/22   [provider]  XARELTO  20 MG TABS tablet TAKE 1 TABLET(20 MG) BY MOUTH DAILY WITH SUPPER 04/22/23   Lucendia Rusk, MD  Zinc  50 MG TABS 1 tablet Orally Once a day    [provider]    Family History Family History  Problem Relation Age of Onset   Heart disease Father    Heart attack Father    Rectal cancer Father    Asthma Sister    Heart disease Brother    Heart attack Brother    Stomach cancer Neg Hx    Colon cancer Neg Hx     Social History Social History   Tobacco Use   Smoking status: Former    Current packs/day: 0.00    Average packs/day: 1 pack/day for 4.0 years (4.0 ttl pk-yrs)    Types: Cigarettes    Start date: 12/05/1958    Quit date: 12/05/1962    Years since quitting: 60.8   Smokeless tobacco: Never  Vaping Use   Vaping status: Never Used  Substance Use Topics   Alcohol use: No   Drug use: No     Allergies   Crestor  [rosuvastatin  calcium ], Hydrocodone -acetaminophen , Lidocaine  hcl, Lipitor [atorvastatin  calcium ], Procaine, Rosuvastatin , Theophylline, Vicodin [hydrocodone -acetaminophen ], Codeine, Ephedrine, Other, Oxycodone, Oxycodone-acetaminophen , Propoxyphene, Quinine derivatives, Sulfa  antibiotics, Sulfur , Epinephrine ,  Penicillins, Quinine, and Theophyllines   Review of Systems Review of Systems  Constitutional:  Negative for activity change, appetite change, fatigue and fever.  Gastrointestinal:  Negative for abdominal pain, diarrhea, nausea and vomiting.  Musculoskeletal:  Positive for myalgias. Negative for arthralgias.  Skin:  Positive for wound. Negative for color change.  Neurological:  Negative for dizziness, light-headedness and headaches.  Hematological:  Negative for adenopathy.     Physical Exam Triage Vital Signs ED Triage Vitals  Encounter Vitals Group     BP 09/19/23 1201 100/63     Systolic BP Percentile --      Diastolic BP Percentile --      Pulse Rate 09/19/23 1201 82     Resp 09/19/23 1201 16     Temp 09/19/23 1201 98.2 F (36.8 C)     Temp Source 09/19/23 1201 Oral     SpO2 09/19/23 1201 96 %     Weight --      Height --      Head Circumference --      Peak Flow --      Pain Score 09/19/23 1159 6     Pain Loc --      Pain Education --      Exclude from Growth Chart --    No data found.  Updated Vital Signs BP 100/63 (BP Location: Right Arm)   Pulse 82   Temp 98.2 F (36.8 C) (Oral)   Resp 16   SpO2 96%   Visual Acuity Right Eye Distance:   Left Eye Distance:   Bilateral  Distance:    Right Eye Near:   Left Eye Near:    Bilateral Near:     Physical Exam Vitals reviewed.  Constitutional:      General: She is awake. She is not in acute distress.    Appearance: Normal appearance. She is well-developed. She is not ill-appearing.     Comments: Very pleasant female appears stated age in no acute distress sitting comfortably in exam room  HENT:     Head: Normocephalic and atraumatic.  Cardiovascular:     Rate and Rhythm: Normal rate. Rhythm irregularly irregular.     Heart sounds: Normal heart sounds, S1 normal and S2 normal. No murmur heard. Pulmonary:     Effort: Pulmonary effort is normal.     Breath sounds: Normal breath sounds. No wheezing, rhonchi  or rales.     Comments: Clear to auscultation bilaterally Abdominal:     Palpations: Abdomen is soft.     Tenderness: There is no abdominal tenderness.  Lymphadenopathy:     Lower Body: No right inguinal adenopathy. No left inguinal adenopathy.  Skin:    Findings: Erythema and wound present.     Comments: Right leg: Multiple small wounds noted lower leg with surrounding erythema up to the knee and mild associated edema.  She does have a healing wound on her lower anterior tibia without active bleeding or drainage.  Erythema is primarily blanchable but she does have several areas of nonblanchable petechiae surrounding wounds on her right lower posterior leg.  Psychiatric:        Behavior: Behavior is cooperative.      UC Treatments / Results  Labs (all labs ordered are listed, but only abnormal results are displayed) Labs Reviewed - No data to display  EKG   Radiology No results found.  Procedures Procedures (including critical care time)  Medications Ordered in UC Medications - No data to display  Initial Impression / Assessment and Plan / UC Course  I have reviewed the triage vital signs and the nursing notes.  Pertinent labs & imaging results that were available during my care of the patient were reviewed by me and considered in my medical decision making (see chart for details).     Patient is well-appearing, afebrile, nontoxic, nontachycardic.  She reports that her tetanus was last updated 1 year ago; appears to have last been updated on 03/15/2023 in epic.  Given associated erythema concern for cellulitis.  She was started on doxycycline  100 mg twice daily for 10 days and we discussed that she is to avoid prolonged sun exposure while on this medication.  She is to keep wounds clean and apply Bactroban  ointment with dressing changes.  We did discuss that given recurrent cat scratches there is concern for cat scratch fever, however, she has no palpable adenopathy on exam.   We initially discussed empiric treatment with azithromycin , however, her last EKG obtained 10/15/2022 showed prolonged QTc at 494 so since she does not have any adenopathy at this time or additional symptoms of Cat Scratch fever will defer azithromycin  treatment for the time being.  Recommend close follow-up with her primary care and recommended that they call and schedule an appointment first thing next week for reevaluation.  She has a daughter who is a Engineer, civil (consulting) that works in wound care and recommended that she come over over the weekend to ensure that the erythema is improving and the wounds appear to be healing.  Discussed that if anything worsens or changes and she has  spread of erythema, increasing pain, worsening swelling, fever, palpable and painful lymphadenopathy she needs to be seen emergently.  Strict return precautions given.  All questions were answered to patient satisfaction.  Final Clinical Impressions(s) / UC Diagnoses   Final diagnoses:  Cellulitis of right lower extremity     Discharge Instructions      We are going to cover you for an infection.  Start doxycycline  100 mg twice daily for 10 days.  Keep the wound clean with soap and water.  Apply Bactroban  ointment with dressing changes.  Follow-up with your primary care soon as possible for recheck.  If the redness spreads or changes in any way you need to be seen immediately.  ED Prescriptions     Medication Sig Dispense Auth. Provider   doxycycline  (VIBRAMYCIN ) 100 MG capsule Take 1 capsule (100 mg total) by mouth 2 (two) times daily. 20 capsule Tylerjames Hoglund K, PA-C   mupirocin  ointment (BACTROBAN ) 2 % Apply 1 Application topically 2 (two) times daily. 22 g Allexus Ovens K, PA-C      PDMP not reviewed this encounter.   Budd Cargo, PA-C 09/19/23 1248

## 2023-09-19 NOTE — Discharge Instructions (Addendum)
 We are going to cover you for an infection.  Start doxycycline  100 mg twice daily for 10 days.  Keep the wound clean with soap and water.  Apply Bactroban  ointment with dressing changes.  Follow-up with your primary care soon as possible for recheck.  If the redness spreads or changes in any way you need to be seen immediately.

## 2023-09-22 NOTE — Telephone Encounter (Signed)
 Called and spoke with pt and she states understanding and says it has gotten a lot better no further questions at this time.

## 2023-09-22 NOTE — Telephone Encounter (Signed)
 Yes, ok to continue the doxycycline  as per UC, and no specific f/u needed for now unless she has worsening pain, red, swelling, fever or any abscess formation.

## 2023-09-25 ENCOUNTER — Other Ambulatory Visit: Payer: Self-pay

## 2023-09-25 ENCOUNTER — Other Ambulatory Visit: Payer: Self-pay | Admitting: Internal Medicine

## 2023-09-30 DIAGNOSIS — L570 Actinic keratosis: Secondary | ICD-10-CM | POA: Diagnosis not present

## 2023-10-16 ENCOUNTER — Ambulatory Visit: Payer: Medicare Other | Admitting: Internal Medicine

## 2023-10-20 ENCOUNTER — Ambulatory Visit: Admitting: Internal Medicine

## 2023-10-27 ENCOUNTER — Other Ambulatory Visit: Payer: Self-pay | Admitting: Interventional Cardiology

## 2023-10-29 DIAGNOSIS — H04123 Dry eye syndrome of bilateral lacrimal glands: Secondary | ICD-10-CM | POA: Diagnosis not present

## 2023-10-29 DIAGNOSIS — H524 Presbyopia: Secondary | ICD-10-CM | POA: Diagnosis not present

## 2023-10-29 DIAGNOSIS — H35373 Puckering of macula, bilateral: Secondary | ICD-10-CM | POA: Diagnosis not present

## 2023-10-29 DIAGNOSIS — D3131 Benign neoplasm of right choroid: Secondary | ICD-10-CM | POA: Diagnosis not present

## 2023-10-29 DIAGNOSIS — H40013 Open angle with borderline findings, low risk, bilateral: Secondary | ICD-10-CM | POA: Diagnosis not present

## 2023-11-08 ENCOUNTER — Other Ambulatory Visit: Payer: Self-pay | Admitting: Interventional Cardiology

## 2023-11-10 ENCOUNTER — Ambulatory Visit (INDEPENDENT_AMBULATORY_CARE_PROVIDER_SITE_OTHER): Payer: Medicare Other

## 2023-11-10 VITALS — Ht 64.0 in | Wt 207.0 lb

## 2023-11-10 DIAGNOSIS — Z0000 Encounter for general adult medical examination without abnormal findings: Secondary | ICD-10-CM

## 2023-11-10 NOTE — Telephone Encounter (Signed)
 Prescription refill request for Xarelto  received.  Indication: AF Last office visit: 07/07/23  Mercedes Bane NP Weight: 101.7kg Age: 88 Scr: 1.02 on 09/09/23 CrCl: 63.56  Based on above findings Xarelto  20mg  daily is the appropriate dose.  Refill approved.

## 2023-11-10 NOTE — Progress Notes (Signed)
 Subjective:   [name] is a 88 y.o. who presents for a Medicare Wellness preventive visit.  As a reminder, Annual Wellness Visits don't include a physical exam, and some assessments may be limited, especially if this visit is performed virtually. We may recommend an in-person follow-up visit with your provider if needed.  Visit Complete: Virtual I connected with  [name] on 11/10/23 by a audio enabled telemedicine application and verified that I am speaking with the correct person using two identifiers.  Patient Location: Home  Provider Location: Office/Clinic  I discussed the limitations of evaluation and management by telemedicine. The patient expressed understanding and agreed to proceed.  Vital Signs: Because this visit was a virtual/telehealth visit, some criteria may be missing or patient reported. Any vitals not documented were not able to be obtained and vitals that have been documented are patient reported.  VideoDeclined- This patient declined Librarian, academic. Therefore the visit was completed with audio only.  Persons Participating in Visit: Patient.  AWV Questionnaire: No: Patient Medicare AWV questionnaire was not completed prior to this visit.  Cardiac Risk Factors include: advanced age (>61men, >4 women);dyslipidemia;hypertension;obesity (BMI >30kg/m2)     Objective:    Today's Vitals   11/10/23 0925  Weight: 207 lb (93.9 kg)  Height: 5' 4 (1.626 m)   Body mass index is 35.53 kg/m.     11/10/2023    9:25 AM 11/05/2022   10:24 AM 10/15/2022   10:24 PM 06/03/2022   10:28 AM 11/02/2021    9:17 AM 06/26/2021   12:00 PM 06/13/2021    4:08 PM  Advanced Directives  Does Patient Have a Medical Advance Directive? Yes Yes Yes Yes Yes Yes Yes  Type of Estate agent of [place];Living will Healthcare Power of [place];Living will Healthcare Power of [place];Living will Healthcare Power of [place];Living will  Healthcare Power of [place];Living will Living will Living will  Does patient want to make changes to medical advance directive?      No - Patient declined No - Patient declined  Copy of Healthcare Power of Attorney in Chart? No - copy requested No - copy requested   No - copy requested      Current Medications (verified) Outpatient Encounter Medications as of 11/10/2023  Medication Sig   albuterol  (PROVENTIL ) (2.5 MG/3ML) 0.083% nebulizer solution Take 3 mLs (2.5 mg total) by nebulization every 6 (six) hours as needed for wheezing or shortness of breath.   albuterol  (VENTOLIN  HFA) 108 (90 Base) MCG/ACT inhaler Inhale 2 puffs into the lungs every 4 (four) hours as needed for wheezing or shortness of breath.   ALLERGY  RELIEF 180 MG tablet TAKE 1 TABLET(180 MG) BY MOUTH DAILY   allopurinol  (ZYLOPRIM ) 300 MG tablet Take 300 mg by mouth daily.   ALPRAZolam  (XANAX ) 0.5 MG tablet TAKE 1 TO 2 TABLETS BY MOUTH TWICE DAILY AS NEEDED FOR ANXIETY OR SLEEP   AMBULATORY NON FORMULARY MEDICATION Medication Name: Prednisone  Injection in knee   azelastine  (ASTELIN ) 0.1 % nasal spray Place 2 sprays into both nostrils daily. Use in each nostril as directed   bacitracin  ointment Apply 1 Application topically daily. With dressing changes   Biotin 10 MG TABS 1 tablet Orally Once a day   bisacodyl  (DULCOLAX) 10 MG suppository Place 1 suppository (10 mg total) rectally as needed for moderate constipation.   budesonide -formoterol  (SYMBICORT ) 80-4.5 MCG/ACT inhaler 2 puffs Inhalation Twice a day   co-enzyme Q-10 50 MG capsule Take 100  mg by mouth daily.   colchicine 0.6 MG tablet Take 0.6 mg by mouth daily as needed (flare  up).   cycloSPORINE  (RESTASIS ) 0.05 % ophthalmic emulsion Apply 1 drop to eye 2 (two) times daily as needed (allergies).   EPINEPHrine  0.3 mg/0.3 mL IJ SOAJ injection Inject 0.3 mg into the muscle as needed for anaphylaxis.   escitalopram  (LEXAPRO ) 10 MG tablet Take 1 tablet (10 mg total) by mouth  daily.   Evolocumab  (REPATHA  SURECLICK) 140 MG/ML SOAJ Inject 140 mg into the skin every 14 (fourteen) days.   ezetimibe  (ZETIA ) 10 MG tablet TAKE 1 TABLET BY MOUTH EVERY DAY. TAKE ON TUESDAY AND FRIDAY   fluticasone  (FLONASE ) 50 MCG/ACT nasal spray Place 1 spray into both nostrils every morning.   fluticasone -salmeterol (ADVAIR DISKUS) 500-50 MCG/ACT AEPB 1 puff Inhalation Twice a day   fluticasone -salmeterol (ADVAIR) 500-50 MCG/ACT AEPB Inhale 1 puff into the lungs in the morning and at bedtime.   furosemide  (LASIX ) 40 MG tablet 1 tablet Orally Once a day for 30 day(s)   gabapentin  (NEURONTIN ) 300 MG capsule TAKE 1 CAPSULE(300 MG) BY MOUTH AT BEDTIME   Homeopathic Products (ARNICARE) GEL Apply 1 application  topically daily as needed (soreness).   hydrochlorothiazide  (HYDRODIURIL ) 25 MG tablet TAKE 2 TABLETS(50 MG) BY MOUTH DAILY   ipratropium (ATROVENT ) 0.03 % nasal spray Place 2 sprays into both nostrils every 12 (twelve) hours as needed for rhinitis.   levalbuterol  (XOPENEX  HFA) 45 MCG/ACT inhaler Inhale 2 puffs into the lungs every 6 (six) hours as needed for wheezing or shortness of breath.   levocetirizine (XYZAL ) 5 MG tablet Take 1 tablet (5 mg total) by mouth every evening.   levothyroxine  (SYNTHROID ) 75 MCG tablet TAKE 1 TABLET(75 MCG) BY MOUTH DAILY BEFORE BREAKFAST   metoprolol  succinate (TOPROL -XL) 25 MG 24 hr tablet TAKE 1/2 TABLET BY MOUTH EVERY DAY   mirtazapine  (REMERON ) 15 MG tablet Take 15 mg by mouth at bedtime.   montelukast  (SINGULAIR ) 10 MG tablet Take 1 tablet (10 mg total) by mouth at bedtime.   mupirocin  ointment (BACTROBAN ) 2 % Apply 1 Application topically 2 (two) times daily.   nystatin cream (MYCOSTATIN) Apply topically.   Omeprazole Magnesium (PRILOSEC) 10 MG PACK 1 packet mixed with 15 ml of water Orally Once a day for 30 day(s)   ondansetron  (ZOFRAN ) 4 MG tablet TAKE 1 TABLET(4 MG) BY MOUTH EVERY 8 HOURS AS NEEDED FOR NAUSEA OR VOMITING   pantoprazole  (PROTONIX )  40 MG tablet TAKE 1 TABLET(40 MG) BY MOUTH DAILY   potassium chloride  SA (KLOR-CON  M) 20 MEQ tablet TAKE 1 TABLET BY MOUTH DAILY   rosuvastatin  (CRESTOR ) 10 MG tablet Take 1 tablet (10 mg total) by mouth 2 (two) times a week.   triamcinolone  cream (KENALOG ) 0.1 % Apply 1 Application topically 2 (two) times daily.   valACYclovir  (VALTREX ) 1000 MG tablet Take 2,000 mg by mouth 2 (two) times daily.   XARELTO  20 MG TABS tablet TAKE 1 TABLET(20 MG) BY MOUTH DAILY WITH SUPPER   Zinc  50 MG TABS 1 tablet Orally Once a day   benzonatate  (TESSALON ) 100 MG capsule Take 1 capsule (100 mg total) by mouth 3 (three) times daily as needed for cough. (Patient not taking: Reported on 11/10/2023)   doxycycline  (VIBRAMYCIN ) 100 MG capsule Take 1 capsule (100 mg total) by mouth 2 (two) times daily. (Patient not taking: Reported on 11/10/2023)   traMADol  (ULTRAM ) 50 MG tablet Take 0.5-1 tablets (25-50 mg total) by mouth every 6 (  six) hours as needed. (Patient not taking: Reported on 11/10/2023)   No facility-administered encounter medications on file as of 11/10/2023.    Allergies (verified) Crestor  [rosuvastatin  calcium ], Hydrocodone -acetaminophen , Lidocaine  hcl, Lipitor [atorvastatin  calcium ], Procaine, Rosuvastatin , Theophylline, Vicodin [hydrocodone -acetaminophen ], Codeine, Ephedrine, Other, Oxycodone, Oxycodone-acetaminophen , Propoxyphene, Quinine derivatives, Sulfa  antibiotics, Sulfur , Epinephrine , Penicillins, Quinine, and Theophyllines   History: Past Medical History:  Diagnosis Date   Allergy     Anxiety    Arthritis    no cartlidge in knees   Asthma    Bradycardia    a. atenolol  stopped due to this, HR 40s.   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  CAD s/p RCA stent 2000 with residual mild-mod disease of left system 2001.   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   Osteoporosis 03/24/2015   Peripheral neuropathy 03/24/2015   Plantar fasciitis    Sleep apnea    Past Surgical History:  Procedure Laterality Date   BREAST LUMPECTOMY W/ NEEDLE LOCALIZATION  01/29/2011   Left with [facility] Dr Merrilyn   CHOLECYSTECTOMY  1955   CORONARY ANGIOPLASTY WITH STENT PLACEMENT  2000   DILATION AND CURETTAGE OF UTERUS  1976   and 1996   HAND SURGERY  1992   tendons between thumb and forefinger   SKIN BIOPSY     1994, 2006, 2008, 2010, 2011, 2011, 2012-  pre-cancerous   TONSILLECTOMY  1955   Family History  Problem Relation Age of Onset   Heart disease Father    Heart attack Father    Rectal cancer Father    Asthma Sister    Heart disease Brother    Heart attack Brother    Stomach cancer Neg Hx    Colon cancer Neg Hx    Social History   Socioeconomic History   Marital status: Widowed    Spouse name: Not on file   Number of children: 3   Years of education: Not on file   Highest education level: Not on file  Occupational History   Not on file  Tobacco Use   Smoking status: Former    Current packs/day: 0.00    Average packs/day: 1 pack/day for 4.0 years (4.0 ttl pk-yrs)    Types: Cigarettes    Start date: 12/05/1958    Quit date: 12/05/1962    Years since quitting: 60.9   Smokeless tobacco: Never  Vaping Use   Vaping status: Never Used  Substance and Sexual Activity   Alcohol use: No   Drug use: No   Sexual activity: Not Currently  Other Topics Concern   Not on file  Social History Narrative   Right handed.   Husband passed 04/28/2021. Married x 63 years.   2 daughters, who live nearby.    Grew up in [place], at [place].    Likes to swim.    Social Drivers of Corporate investment banker Strain: Low Risk   (11/10/2023)   Overall Financial Resource Strain (CARDIA)    Difficulty of Paying Living Expenses: Not hard at all  Food Insecurity: No Food Insecurity (11/10/2023)  Hunger Vital Sign    Worried About Running Out of Food in the Last Year: Never true    Ran Out of Food in the Last Year: Never true  Transportation Needs: No Transportation Needs (11/10/2023)   PRAPARE - Administrator, Civil Service (Medical): No    Lack of Transportation (Non-Medical): No  Physical Activity: Insufficiently Active (11/10/2023)   Exercise Vital Sign    Days of Exercise per Week: 7 days    Minutes of Exercise per Session: 10 min  Stress: No Stress Concern Present (11/10/2023)   [employer] of Occupational Health - Occupational Stress Questionnaire    Feeling of Stress: Not at all  Social Connections: Moderately Integrated (11/10/2023)   Social Connection and Isolation Panel    Frequency of Communication with Friends and Family: More than three times a week    Frequency of Social Gatherings with Friends and Family: More than three times a week    Attends Religious Services: More than 4 times per year    Active Member of [employer] or Organizations: Yes    Attends Banker Meetings: More than 4 times per year    Marital Status: Widowed    Tobacco Counseling Counseling given: No    Clinical Intake:  Pre-visit preparation completed: Yes  Pain : No/denies pain     BMI - recorded: 35.53 Nutritional Status: BMI > 30  Obese Nutritional Risks: None Diabetes: No  Lab Results  Component Value Date   HGBA1C 5.6 09/09/2023   HGBA1C 5.6 11/25/2022   HGBA1C 5.4 05/21/2022     How often do you need to have someone help you when you read instructions, pamphlets, or other written materials from your doctor or pharmacy?: 1 - Never  Interpreter Needed?: No  Information entered by :: Verdie Saba, CMA   Activities of Daily Living     11/10/2023    9:33 AM  In your present state of  health, do you have any difficulty performing the following activities:  Hearing? 1  Comment declines an Audiologist appt  Vision? 0  Difficulty concentrating or making decisions? 0  Walking or climbing stairs? 0  Dressing or bathing? 0  Doing errands, shopping? 0  Preparing Food and eating ? N  Using the Toilet? N  In the past six months, have you accidently leaked urine? Y  Comment wears depends  Do you have problems with loss of bowel control? N  Managing your Medications? N  Managing your Finances? N  Housekeeping or managing your Housekeeping? N    Patient Care Team: Norleen Lynwood ORN, MD as PCP - General (Internal Medicine) Dann Candyce RAMAN, MD as PCP - Cardiology (Cardiology) Merrilyn Handler, MD (Inactive) as Surgeon (General Surgery) Magrinat, Sandria BROCKS, MD (Inactive) (Hematology and Oncology) Keenan Hastings, MD (Radiation Oncology) Skeet Juliene SAUNDERS, DO as Consulting Physician (Neurology) Cleatus Collar, MD as Consulting Physician (Ophthalmology)  I have updated your Care Teams any recent Medical Services you may have received from other providers in the past year.     Assessment:   This is a routine wellness examination for [name].  Hearing/Vision screen Hearing Screening - Comments:: Yes-Declines a referral to an Audiologist Vision Screening - Comments:: Denies Vision Concerns - Up-to-date appt w/Dr Cleatus   Goals Addressed               This Visit's Progress     Patient Stated (pt-stated)        Patient stated she plans to  stay alive        Depression Screen     11/10/2023    9:37 AM 09/12/2023    8:24 AM 09/05/2023    8:09 AM 04/17/2023    2:31 PM 11/25/2022    8:43 AM 11/05/2022   10:27 AM 07/24/2022    2:05 PM  PHQ 2/9 Scores  PHQ - 2 Score 0 0 0 0 0 0 0  PHQ- 9 Score 0   0 0 0 0    Fall Risk     11/10/2023    9:35 AM 09/12/2023    8:28 AM 09/05/2023    8:17 AM 04/17/2023    2:31 PM 12/16/2022   10:07 AM  Fall Risk   Falls in the past year? 0 0 0 0  1  Number falls in past yr: 0 0 0 0 0  Injury with Fall? 0 0 0 0 1  Risk for fall due to : No Fall Risks No Fall Risks No Fall Risks  History of fall(s)  Follow up Falls evaluation completed;Falls prevention discussed Falls evaluation completed Falls evaluation completed Falls evaluation completed Falls evaluation completed    MEDICARE RISK AT HOME:  Medicare Risk at Home Any stairs in or around the home?: Yes If so, are there any without handrails?: No Home free of loose throw rugs in walkways, pet beds, electrical cords, etc?: Yes Adequate lighting in your home to reduce risk of falls?: Yes Life alert?: No Use of a cane, walker or w/c?: Yes (has a walker if need) Grab bars in the bathroom?: No Shower chair or bench in shower?: Yes Elevated toilet seat or a handicapped toilet?: No  TIMED UP AND GO:  Was the test performed?  No  Cognitive Function: 6CIT completed        11/10/2023    9:39 AM 11/05/2022   10:27 AM 11/02/2021    9:23 AM  6CIT Screen  What Year? 0 points 0 points 0 points  What month? 0 points 0 points 0 points  What time? 0 points 0 points 0 points  Count back from 20 0 points 0 points 0 points  Months in reverse 0 points 0 points 0 points  Repeat phrase 0 points 0 points 0 points  Total Score 0 points 0 points 0 points    Immunizations Immunization History  Administered Date(s) Administered   Fluad Quad(high Dose 65+) 03/30/2019, 01/21/2020, 04/24/2021, 02/12/2022   Fluad Trivalent(High Dose 65+) 04/18/2023   Influenza Split 04/06/2009, 04/05/2010, 01/10/2011, 02/10/2012, 04/23/2013   Influenza, High Dose Seasonal PF 03/20/2017, 01/19/2018   Influenza-Unspecified 05/08/2015, 01/23/2016   PFIZER(Purple Top)SARS-COV-2 Vaccination 06/11/2019, 07/06/2019, 12/29/2019, 02/28/2020, 12/18/2020   Pneumococcal Conjugate-13 06/01/2013, 03/20/2017   Pneumococcal Polysaccharide-23 03/16/2003, 01/23/2016, 12/18/2020   Td 12/08/2009   Tdap 04/19/2022, 03/15/2023    Zoster Recombinant(Shingrix) 06/07/2015, 03/18/2016   Zoster, Live 06/07/2015    Screening Tests Health Maintenance  Topic Date Due   Hepatitis B Vaccines (1 of 3 - Risk 3-dose series) Never done   COVID-19 Vaccine (6 - 2024-25 season) 01/05/2023   INFLUENZA VACCINE  12/05/2023   Medicare Annual Wellness (AWV)  11/09/2024   DTaP/Tdap/Td (4 - Td or Tdap) 03/14/2033   Pneumococcal Vaccine: 50+ Years  Completed   DEXA SCAN  Completed   Zoster Vaccines- Shingrix  Completed   HPV VACCINES  Aged Out   Meningococcal B Vaccine  Aged Out    Health Maintenance  Health Maintenance Due  Topic Date Due   Hepatitis  B Vaccines (1 of 3 - Risk 3-dose series) Never done   COVID-19 Vaccine (6 - 2024-25 season) 01/05/2023   Health Maintenance Items Addressed:11/10/2023  Pt c/o slight hearing difficulties but declines a referral to an Audiologist at this time.  Advised to contact PCP's nurse if changes mind.  Pt agreed to do so.   Additional Screening:  Vision Screening: Recommended annual ophthalmology exams for early detection of glaucoma and other disorders of the eye. Would you like a referral to an eye doctor? No  Patient stated she had an eye exam 2 weeks ago at [facility].  Dental Screening: Recommended annual dental exams for proper oral hygiene  Community Resource Referral / Chronic Care Management: CRR required this visit?  No   CCM required this visit?  No   Plan:    I have personally reviewed and noted the following in the patient's chart:   Medical and social history Use of alcohol, tobacco or illicit drugs  Current medications and supplements including opioid prescriptions. Patient is not currently taking opioid prescriptions. Functional ability and status Nutritional status Physical activity Advanced directives List of other physicians Hospitalizations, surgeries, and ER visits in previous 12 months Vitals Screenings to include cognitive, depression, and  falls Referrals and appointments  In addition, I have reviewed and discussed with patient certain preventive protocols, quality metrics, and best practice recommendations. A written personalized care plan for preventive services as well as general preventive health recommendations were provided to patient.   Verdie CHRISTELLA Saba, CMA   11/10/2023   After Visit Summary: (MyChart) Due to this being a telephonic visit, the after visit summary with patients personalized plan was offered to patient via MyChart   Notes: Nothing significant to report at this time.

## 2023-11-10 NOTE — Patient Instructions (Addendum)
 Ms. Olmo , Thank you for taking time out of your busy schedule to complete your Annual Wellness Visit with me. I enjoyed our conversation and look forward to speaking with you again next year. I, as well as your care team,  appreciate your ongoing commitment to your health goals. Please review the following plan we discussed and let me know if I can assist you in the future. Your Game plan/ To Do List    Follow up Visits: Next Medicare AWV with our clinical staff: 11/10/2024   Have you seen your provider in the last 6 months (3 months if uncontrolled diabetes)? Yes Next Office Visit with your provider: 03/15/2024  Clinician Recommendations:  Aim for 30 minutes of exercise or brisk walking, 6-8 glasses of water, and 5 servings of fruits and vegetables each day. Educated and advised on getting the Hepatitis B Vaccines in 2025 at the office or local pharmacy.      This is a list of the screening recommended for you and due dates:  Health Maintenance  Topic Date Due   Hepatitis B Vaccine (1 of 3 - Risk 3-dose series) Never done   COVID-19 Vaccine (6 - 2024-25 season) 01/05/2023   Flu Shot  12/05/2023   Medicare Annual Wellness Visit  11/09/2024   DTaP/Tdap/Td vaccine (4 - Td or Tdap) 03/14/2033   Pneumococcal Vaccine for age over 49  Completed   DEXA scan (bone density measurement)  Completed   Zoster (Shingles) Vaccine  Completed   HPV Vaccine  Aged Out   Meningitis B Vaccine  Aged Out    Advanced directives: (Copy Requested) Please bring a copy of your health care power of attorney and living will to the office to be added to your chart at your convenience. You can mail to [facility] [address] [place], [place] [address] or email to [email or web address] Advance Care Planning is important because it:  [x]  Makes sure you receive the medical care that is consistent with your values, goals, and preferences  [x]  It provides guidance to your family and loved ones  and reduces their decisional burden about whether or not they are making the right decisions based on your wishes.  Follow the link provided in your after visit summary or read over the paperwork we have mailed to you to help you started getting your Advance Directives in place. If you need assistance in completing these, please reach out to us  so that we can help you!

## 2023-11-15 ENCOUNTER — Ambulatory Visit (HOSPITAL_COMMUNITY)
Admission: EM | Admit: 2023-11-15 | Discharge: 2023-11-15 | Disposition: A | Discharge status: Home / Self Care | Attending: Physician Assistant | Admitting: Physician Assistant

## 2023-11-15 ENCOUNTER — Encounter (HOSPITAL_COMMUNITY): Payer: Self-pay

## 2023-11-15 ENCOUNTER — Telehealth (HOSPITAL_COMMUNITY): Payer: Self-pay | Admitting: *Deleted

## 2023-11-15 DIAGNOSIS — L03113 Cellulitis of right upper limb: Secondary | ICD-10-CM | POA: Diagnosis not present

## 2023-11-15 MED ORDER — DOXYCYCLINE HYCLATE 100 MG PO CAPS: 100.0000 mg | ORAL_CAPSULE | Freq: Two times a day (BID) | ORAL | 0 refills | Status: DC

## 2023-11-15 NOTE — Telephone Encounter (Signed)
 Pt called and needs rx sent to [facility] since her pharmacy is closed today, rx resent pt aware

## 2023-11-15 NOTE — ED Provider Notes (Signed)
 [facility]    CSN: [id number] Arrival date & time: 11/15/23  1632      History   Chief Complaint Chief Complaint  Patient presents with   Wound Infection    HPI [name] is a 88 y.o. female.   Patient presents today with a 2-day history of right arm pain and erythema.  She reports that she had a small wound from a cat scratch and this developed an area of redness yesterday.  Over the course of today it has significantly spread prompting evaluation.  She reports that the pain is rated 3 on a 0-10 pain scale, described as a soreness, no alleviating factors identified.  She has applied bacitracin  to the wound but this has not been effective in managing her symptoms.  Denies any fever, nausea, vomiting.  She reports that the cat is feral but lives with her now and is up-to-date on its vaccines as she takes it to the vet.  She denies any bites from the cat and is confident that she was only scratched.  She had an episode of similar symptoms involving her leg in May and was treated with doxycycline  with resolution of symptoms.  Denies additional antibiotics in the past 90 days.  Denies history of MRSA.  She does have an history of atrial fibrillation and is chronically anticoagulated with Xarelto .  She is up-to-date on tetanus which was last given 03/15/2023.    Past Medical History:  Diagnosis Date   Allergy     Anxiety    Arthritis    no cartlidge in knees   Asthma    Bradycardia    a. atenolol  stopped due to this, HR 40s.   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  CAD s/p RCA stent 2000 with residual mild-mod disease of left system 2001.   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   Osteoporosis 03/24/2015   Peripheral neuropathy 03/24/2015   Plantar fasciitis    Sleep apnea     Patient Active Problem List   Diagnosis Date Noted   Leg wound, right 09/06/2023   Closed nondisplaced fracture of second cervical vertebra (HCC) 09/05/2023   Acute renal failure superimposed on chronic kidney disease (HCC) 12/16/2022   Cloudy urine 11/25/2022   Insomnia 07/27/2022   Vitamin D  deficiency 07/27/2022   Pulmonary hypertension (HCC) 11/07/2021   Bronchiectasis without complication (HCC) 11/07/2021   Hepatic cirrhosis (HCC) 11/06/2021   ILD (interstitial lung disease) (HCC) 10/03/2021   Abnormal LFTs 09/28/2021   Weight loss 08/19/2021   Chronic respiratory failure with hypoxia (HCC) 08/19/2021   Debility 06/29/2021   Elevated troponin 06/12/2021   Febrile illness 05/22/2021   Anxiety state 10/23/2020   Pure hypercholesterolemia 10/23/2020   Atherosclerosis of coronary artery 10/23/2020   Sinus node dysfunction (HCC) 10/23/2020   Mixed hyperlipidemia 10/23/2020   OSA on CPAP 10/23/2020   Fall 09/05/2020   Nausea 04/23/2020   Constipation 04/23/2020   Posterior uveitis, 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   Dysphagia 01/21/2020   CHF (congestive heart failure) (HCC) 11/23/2019   Osteoarthritis 09/19/2018  Whiplash 06/10/2017   Cough 05/29/2017   Concussion 05/29/2017   Hammer toes of both feet 12/19/2016   Allergic rhinitis 12/09/2016   Pedal edema 09/17/2016   Chest pain 07/18/2016   Gross hematuria 04/26/2016   Prediabetes 03/21/2016   Dysuria 12/07/2015   Choroidal nevus, 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   Malignant neoplasm of left female breast (HCC) 10/10/2015   Anxiety and depression 06/30/2015   Encounter for well adult exam with abnormal findings 06/30/2015   Hypothyroidism 03/24/2015   Gout 03/24/2015   Essential hypertension 03/24/2015   Asthma 03/24/2015   Peripheral neuropathy 03/24/2015    Osteoporosis 03/24/2015   Morbid obesity (HCC) 03/24/2015   Long term current use of anticoagulant therapy 03/16/2015   Lymphadenopathy, cervical 03/16/2015   Hyperbilirubinemia 03/16/2015   Coronary arteriosclerosis 09/22/2014   Hypokalemia 02/18/2013   Elevated bilirubin 02/18/2013   Morbid obesity with BMI of 45.0-49.9, adult (HCC) 10/29/2012   Rotator cuff tear arthropathy 10/29/2012   DJD (degenerative joint disease) of knee 10/29/2012   Arthritis    Hx of radiation therapy    Atrial fibrillation, chronic (HCC) 01/21/2011   Malignant neoplasm of upper-outer quadrant of left breast in female, 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      Home Medications    Prior to Admission medications   Medication Sig Start Date End Date Taking? Authorizing Provider  albuterol  (PROVENTIL ) (2.5 MG/3ML) 0.083% nebulizer solution Take 3 mLs (2.5 mg total) by nebulization every 6 (six) hours as needed for wheezing or shortness of breath. 10/12/21   Cobb, Comer GAILS, NP  albuterol  (VENTOLIN  HFA) 108 (90 Base) MCG/ACT inhaler Inhale 2 puffs into the lungs every 4 (four) hours as needed for wheezing or shortness of breath. 05/06/23   Vonna Sharlet POUR, MD  ALLERGY  RELIEF 180 MG tablet TAKE 1 TABLET(180 MG) BY MOUTH DAILY 03/24/23   Shelah Lamar RAMAN, MD  allopurinol  (ZYLOPRIM ) 300 MG tablet Take 300 mg by mouth daily.    [provider]  ALPRAZolam  (XANAX ) 0.5 MG tablet  TAKE 1 TO 2 TABLETS BY MOUTH TWICE DAILY AS NEEDED FOR ANXIETY OR SLEEP 11/25/22   Norleen Lynwood ORN, MD  AMBULATORY NON FORMULARY MEDICATION Medication Name: Prednisone  Injection in knee    [provider]  azelastine  (ASTELIN ) 0.1 % nasal spray Place 2 sprays into both nostrils daily. Use in each nostril as directed 07/14/19   Norleen Lynwood ORN, MD  bacitracin  ointment Apply 1 Application topically daily. With dressing changes 08/28/23   Howell Lunger, DO  Biotin 10 MG TABS 1 tablet Orally Once a day    [provider]  bisacodyl  (DULCOLAX) 10 MG suppository Place 1 suppository (10 mg total) rectally as needed for moderate constipation. 12/09/22   Corey, Evan S, MD  budesonide -formoterol  (SYMBICORT ) 80-4.5 MCG/ACT inhaler 2 puffs Inhalation Twice a day    [provider]  co-enzyme Q-10 50 MG capsule Take 100 mg by mouth daily.    [provider]  colchicine 0.6 MG tablet Take 0.6 mg by mouth daily as needed (flare  up).    [provider]  cycloSPORINE  (RESTASIS ) 0.05 % ophthalmic emulsion Apply 1 drop to eye 2 (two) times daily as needed (allergies).    [provider]  doxycycline  (VIBRAMYCIN ) 100 MG capsule Take 1 capsule (100 mg total) by mouth 2 (two) times daily. 11/15/23   Tamar Lipscomb K, PA-C  EPINEPHrine  0.3 mg/0.3 mL IJ SOAJ injection Inject 0.3 mg into the muscle as needed for anaphylaxis. 12/29/19   [provider]  escitalopram  (LEXAPRO ) 10 MG tablet Take 1 tablet (10 mg total) by mouth daily. 09/05/23 09/04/24  Norleen Lynwood ORN, MD  Evolocumab  (REPATHA  SURECLICK) 140 MG/ML SOAJ Inject 140 mg into the skin every 14 (fourteen) days. 08/12/23   Swinyer, Rosaline HERO, NP  ezetimibe  (ZETIA ) 10 MG tablet TAKE 1 TABLET BY MOUTH EVERY DAY. TAKE ON TUESDAY AND FRIDAY 08/18/23   Norleen Lynwood ORN, MD  fluticasone  (FLONASE ) 50 MCG/ACT nasal spray Place 1 spray into both nostrils every morning.    [provider]  fluticasone -salmeterol (ADVAIR DISKUS)  500-50 MCG/ACT AEPB 1 puff Inhalation Twice a day    [provider]  fluticasone -salmeterol (ADVAIR) 500-50 MCG/ACT AEPB Inhale 1 puff into the lungs in the morning and at bedtime.    [provider]  furosemide  (LASIX ) 40 MG tablet 1 tablet Orally Once a day for 30 day(s)    [provider]  gabapentin  (NEURONTIN ) 300 MG capsule TAKE 1 CAPSULE(300 MG) BY MOUTH AT BEDTIME 06/17/23   Norleen Lynwood ORN, MD  Homeopathic Products (ARNICARE) GEL Apply 1 application  topically daily as needed (soreness).    [provider]  hydrochlorothiazide  (HYDRODIURIL ) 25 MG tablet TAKE 2 TABLETS(50 MG) BY MOUTH DAILY 05/09/23   Norleen Lynwood ORN, MD  ipratropium (ATROVENT ) 0.03 % nasal spray Place 2 sprays into both nostrils every 12 (twelve) hours as needed for rhinitis. 04/18/23   Shelah Lamar RAMAN, MD  levalbuterol  (XOPENEX  HFA) 45 MCG/ACT inhaler Inhale 2 puffs into the lungs every 6 (six) hours as needed for wheezing or shortness of breath. 09/30/19   Norleen Lynwood ORN, MD  levocetirizine (XYZAL ) 5 MG tablet Take 1 tablet (5 mg total) by mouth every evening. 06/24/19   Norleen Lynwood ORN, MD  levothyroxine  (SYNTHROID ) 75 MCG tablet TAKE 1 TABLET(75 MCG) BY MOUTH DAILY BEFORE BREAKFAST 06/25/23   Norleen Lynwood ORN, MD  metoprolol  succinate (TOPROL -XL) 25 MG 24 hr tablet TAKE 1/2 TABLET BY MOUTH EVERY DAY 10/27/23   Dann Candyce RAMAN, MD  mirtazapine  (REMERON ) 15 MG tablet Take 15 mg by mouth at bedtime. 08/23/23   [provider]  montelukast  (SINGULAIR ) 10 MG tablet Take 1 tablet (10 mg total) by mouth at bedtime. 07/01/21   Pokhrel, Laxman, MD  mupirocin  ointment (BACTROBAN ) 2 % Apply 1 Application topically 2 (two) times daily. 09/19/23   Wendy Hoback K, PA-C  nystatin cream (MYCOSTATIN) Apply topically. 03/09/14   [provider]  Omeprazole Magnesium (PRILOSEC) 10 MG PACK 1 packet mixed with 15 ml of water Orally Once a day for 30 day(s)    [provider]  ondansetron   (ZOFRAN ) 4 MG tablet TAKE 1 TABLET(4 MG) BY MOUTH EVERY 8 HOURS AS NEEDED FOR NAUSEA OR VOMITING 09/25/23   Norleen Lynwood ORN, MD  pantoprazole  (PROTONIX ) 40 MG tablet TAKE 1 TABLET(40 MG)  BY MOUTH DAILY 08/14/23   Charlanne Groom, MD  potassium chloride  SA (KLOR-CON  M) 20 MEQ tablet TAKE 1 TABLET BY MOUTH DAILY 08/15/23   Norleen Lynwood ORN, MD  rosuvastatin  (CRESTOR ) 10 MG tablet Take 1 tablet (10 mg total) by mouth 2 (two) times a week. 07/07/23   Swinyer, Rosaline HERO, NP  triamcinolone  cream (KENALOG ) 0.1 % Apply 1 Application topically 2 (two) times daily. 04/17/23 04/16/24  Norleen Lynwood ORN, MD  valACYclovir  (VALTREX ) 1000 MG tablet Take 2,000 mg by mouth 2 (two) times daily. 10/22/22   [provider]  XARELTO  20 MG TABS tablet TAKE 1 TABLET(20 MG) BY MOUTH DAILY WITH SUPPER 11/10/23   Dann Candyce RAMAN, MD  Zinc  50 MG TABS 1 tablet Orally Once a day    [provider]    Family History Family History  Problem Relation Age of Onset   Heart disease Father    Heart attack Father    Rectal cancer Father    Asthma Sister    Heart disease Brother    Heart attack Brother    Stomach cancer Neg Hx    Colon cancer Neg Hx     Social History Social History   Tobacco Use   Smoking status: Former    Current packs/day: 0.00    Average packs/day: 1 pack/day for 4.0 years (4.0 ttl pk-yrs)    Types: Cigarettes    Start date: 12/05/1958    Quit date: 12/05/1962    Years since quitting: 60.9   Smokeless tobacco: Never  Vaping Use   Vaping status: Never Used  Substance Use Topics   Alcohol use: No   Drug use: No     Allergies   Crestor  [rosuvastatin  calcium ], Hydrocodone -acetaminophen , Lidocaine  hcl, Lipitor [atorvastatin  calcium ], Procaine, Rosuvastatin , Theophylline, Vicodin [hydrocodone -acetaminophen ], Codeine, Ephedrine, Other, Oxycodone, Oxycodone-acetaminophen , Propoxyphene, Quinine derivatives, Sulfa  antibiotics, Sulfur , Epinephrine , Penicillins, Quinine, and Theophyllines   Review  of Systems Review of Systems  Constitutional:  Positive for activity change. Negative for appetite change, fatigue and fever.  Respiratory:  Negative for cough and shortness of breath.   Cardiovascular:  Negative for chest pain.  Gastrointestinal:  Negative for abdominal pain, diarrhea, nausea and vomiting.  Skin:  Positive for color change and wound.  Neurological:  Negative for weakness and numbness.     Physical Exam Triage Vital Signs ED Triage Vitals  Encounter Vitals Group     BP 11/15/23 1650 123/72     Girls Systolic BP Percentile --      Girls Diastolic BP Percentile --      Boys Systolic BP Percentile --      Boys Diastolic BP Percentile --      Pulse Rate 11/15/23 1650 83     Resp 11/15/23 1650 16     Temp 11/15/23 1650 98.5 F (36.9 C)     Temp Source 11/15/23 1650 Oral     SpO2 11/15/23 1650 95 %     Weight --      Height --      Head Circumference --      Peak Flow --      Pain Score 11/15/23 1647 4     Pain Loc --      Pain Education --      Exclude from Growth Chart --    No data found.  Updated Vital Signs BP 123/72 (BP Location: Left Arm)   Pulse 83   Temp 98.5 F (36.9 C) (Oral)   Resp 16  SpO2 95%   Visual Acuity Right Eye Distance:   Left Eye Distance:   Bilateral Distance:    Right Eye Near:   Left Eye Near:    Bilateral Near:     Physical Exam Vitals reviewed.  Constitutional:      General: She is awake. She is not in acute distress.    Appearance: Normal appearance. She is well-developed. She is not ill-appearing.     Comments: Very pleasant female appears stated age in no acute distress sitting comfortably in exam room  HENT:     Head: Normocephalic and atraumatic.  Cardiovascular:     Rate and Rhythm: Normal rate. Rhythm irregularly irregular.     Heart sounds: Normal heart sounds, S1 normal and S2 normal. No murmur heard. Pulmonary:     Effort: Pulmonary effort is normal.     Breath sounds: Normal breath sounds. No  wheezing, rhonchi or rales.     Comments: Clear to auscultation bilaterally Abdominal:     Palpations: Abdomen is soft.     Tenderness: There is no abdominal tenderness.  Musculoskeletal:     Comments: Right hand: Hand is neurovascular intact.  Normal pincer grip strength.  Skin:    Findings: Erythema and wound present.     Comments: Approximately 1 cm abrasion noted dorsal right forearm with surrounding erythema measuring 14 cm x 12 cm.  No bleeding or drainage from wound.  No streaking or evidence of lymphangitis.  Psychiatric:        Behavior: Behavior is cooperative.      UC Treatments / Results  Labs (all labs ordered are listed, but only abnormal results are displayed) Labs Reviewed - No data to display  EKG   Radiology No results found.  Procedures Procedures (including critical care time)  Medications Ordered in UC Medications - No data to display  Initial Impression / Assessment and Plan / UC Course  I have reviewed the triage vital signs and the nursing notes.  Pertinent labs & imaging results that were available during my care of the patient were reviewed by me and considered in my medical decision making (see chart for details).     The patient is well-appearing, afebrile, nontoxic, nontachycardic.  Patient is up-to-date on her tetanus vaccine.  Will use doxycycline  given her medication allergies.  We discussed that she is to avoid prolonged sun exposure while on this medication due to associated photosensitivity.  She is to keep the area clean with soap and water and apply bacitracin .  We discussed that she is to demarcate the area of erythema and monitor this for spread after being on the antibiotics.  If anything worsens and she has a significant spread of erythema after being on antibiotics for several days, rapid progression of erythema, fever, nausea, vomiting, numbness or tingling in her hand she needs to be seen emergently.  Strict return precautions given.   All questions answered to patient satisfaction.  Final Clinical Impressions(s) / UC Diagnoses   Final diagnoses:  Cellulitis of right upper extremity     Discharge Instructions      We are treating you for skin infection.  Start doxycycline  100 mg twice daily for 10 days.  Stay out of the sun while on this medication.  Keep the wound clean and apply bacitracin .  Monitor the area of redness and if this continues to spread after being on the antibiotics you need to be seen immediately.  If anything worsens you have increasing pain, swelling of  your hand, fever, nausea, vomiting you should be seen immediately.     ED Prescriptions     Medication Sig Dispense Auth. Provider   doxycycline  (VIBRAMYCIN ) 100 MG capsule Take 1 capsule (100 mg total) by mouth 2 (two) times daily. 20 capsule Lequita Meadowcroft K, PA-C      PDMP not reviewed this encounter.   Sherrell Rocky POUR, PA-C 11/15/23 1732

## 2023-11-15 NOTE — Discharge Instructions (Addendum)
 We are treating you for skin infection.  Start doxycycline  100 mg twice daily for 10 days.  Stay out of the sun while on this medication.  Keep the wound clean and apply bacitracin .  Monitor the area of redness and if this continues to spread after being on the antibiotics you need to be seen immediately.  If anything worsens you have increasing pain, swelling of your hand, fever, nausea, vomiting you should be seen immediately.

## 2023-11-15 NOTE — ED Triage Notes (Signed)
 Pt presents to UC for c/o right forearm warmth and redness. Pt states it started as a small spot lasat night but has grown in size. 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

## 2023-12-03 ENCOUNTER — Other Ambulatory Visit: Payer: Self-pay | Admitting: Gastroenterology

## 2023-12-11 ENCOUNTER — Other Ambulatory Visit: Payer: Self-pay | Admitting: Internal Medicine

## 2023-12-25 ENCOUNTER — Ambulatory Visit (HOSPITAL_COMMUNITY): Admission: EM | Admit: 2023-12-25 | Discharge: 2023-12-25 | Disposition: A | Discharge status: Home / Self Care

## 2023-12-25 ENCOUNTER — Encounter (HOSPITAL_COMMUNITY): Payer: Self-pay

## 2023-12-25 DIAGNOSIS — I872 Venous insufficiency (chronic) (peripheral): Secondary | ICD-10-CM | POA: Diagnosis not present

## 2023-12-25 DIAGNOSIS — M7989 Other specified soft tissue disorders: Secondary | ICD-10-CM

## 2023-12-25 DIAGNOSIS — M62830 Muscle spasm of back: Secondary | ICD-10-CM | POA: Diagnosis not present

## 2023-12-25 NOTE — Discharge Instructions (Signed)
 Use compression socks and elevate your legs to reduce swelling.  Tylenol  650mg  every 6 hours as needed for low back pain.   Use heat to the low back 20 minutes on 20 minutes off and do gentle exercises to alleviate pain.   If you develop any new or worsening symptoms or if your symptoms do not start to improve, please return here or follow-up with your primary care provider. If your symptoms are severe, please go to the emergency room.

## 2023-12-25 NOTE — ED Triage Notes (Signed)
 Pt states redness and swelling to both of her legs for the past 2-3 days.  Also c/o  lower back pain.  States the other day she heard something pop in her back when she was just turning around.  Pt ambulating with  a walker.

## 2023-12-25 NOTE — ED Provider Notes (Signed)
 [facility]    CSN: [id number] Arrival date & time: 12/25/23  1112      History   Chief Complaint Chief Complaint  Patient presents with   Leg Swelling    HPI [name] is a 88 y.o. female.   [name] is a 88 y.o. female presenting for chief complaint of Leg Swelling that started 2-3 days ago. She has noticed swelling to both legs for the last few days and is concerned due to new redness to both legs. Denies warmth/heat to the legs. No recent injuries or open wounds to the legs.  She is concerned that she may have cellulitis of the legs since she had this twice this summer and needed antibiotics.  Denies numbness and tingling to the legs, recent falls, fever, chills, nausea, vomiting, and bodyaches.  Denies chest pain, shortness of breath, recent travel, and posterior leg/calf pain.  She does not wear compression socks and tries to elevate her legs is much as possible to relieve the swelling.  Additionally reports pain to the right sided lower back starting last night after she turned a certain way yesterday.  She felt a pop to the right side of her back when twisting.  Pain feels like a sharp and tight spasm to the right side of the low back and intermittently travels to the right buttock.  Pain is worsened by movement and certain positions, improves little bit with rest.  Denies saddle anesthesia, urinary symptoms, unilateral lower extremity weakness, paresthesias to the legs, and rash.  She has not attempted treatment of symptoms at home.  She walks with a walker at baseline.  Denies changes in gait.    Past Medical History:  Diagnosis Date   Allergy     Anxiety    Arthritis    no cartlidge in knees   Asthma    Bradycardia    a. atenolol  stopped due to this, HR 40s.   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  CAD s/p RCA stent 2000 with residual mild-mod disease of left system 2001.   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   Osteoporosis 03/24/2015   Peripheral neuropathy 03/24/2015   Plantar fasciitis    Sleep apnea     Patient Active Problem List   Diagnosis Date Noted   Leg wound, right 09/06/2023   Closed nondisplaced fracture of second cervical vertebra (HCC) 09/05/2023   Acute renal failure superimposed on chronic kidney disease (HCC) 12/16/2022   Cloudy urine 11/25/2022   Insomnia 07/27/2022   Vitamin D  deficiency 07/27/2022   Pulmonary hypertension (HCC) 11/07/2021   Bronchiectasis without complication (HCC) 11/07/2021   Hepatic cirrhosis (HCC) 11/06/2021   ILD (interstitial lung disease) (HCC) 10/03/2021   Abnormal LFTs 09/28/2021   Weight loss 08/19/2021   Chronic respiratory failure with hypoxia (HCC) 08/19/2021   Debility 06/29/2021   Elevated troponin 06/12/2021   Febrile illness 05/22/2021   Anxiety state 10/23/2020   Pure hypercholesterolemia 10/23/2020   Atherosclerosis of coronary artery 10/23/2020   Sinus node dysfunction (HCC) 10/23/2020   Mixed  hyperlipidemia 10/23/2020   OSA on CPAP 10/23/2020   Fall 09/05/2020   Nausea 04/23/2020   Constipation 04/23/2020   Posterior uveitis, 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   Dysphagia 01/21/2020   CHF (congestive heart failure) (HCC) 11/23/2019   Osteoarthritis 09/19/2018   Whiplash 06/10/2017   Cough 05/29/2017   Concussion 05/29/2017   Hammer toes of both feet 12/19/2016   Allergic rhinitis 12/09/2016   Pedal edema 09/17/2016   Chest pain 07/18/2016   Gross hematuria 04/26/2016   Prediabetes 03/21/2016   Dysuria 12/07/2015   Choroidal nevus, 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   Malignant neoplasm of left female breast (HCC)  10/10/2015   Anxiety and depression 06/30/2015   Encounter for well adult exam with abnormal findings 06/30/2015   Hypothyroidism 03/24/2015   Gout 03/24/2015   Essential hypertension 03/24/2015   Asthma 03/24/2015   Peripheral neuropathy 03/24/2015   Osteoporosis 03/24/2015   Morbid obesity (HCC) 03/24/2015   Long term current use of anticoagulant therapy 03/16/2015   Lymphadenopathy, cervical 03/16/2015   Hyperbilirubinemia 03/16/2015   Coronary arteriosclerosis 09/22/2014   Hypokalemia 02/18/2013   Elevated bilirubin 02/18/2013   Morbid obesity with BMI of 45.0-49.9, adult (HCC) 10/29/2012   Rotator cuff tear arthropathy 10/29/2012   DJD (degenerative joint disease) of knee 10/29/2012   Arthritis    Hx of radiation therapy    Atrial fibrillation, chronic (HCC) 01/21/2011   Malignant neoplasm of upper-outer quadrant of left breast in female, 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      Home Medications    Prior to Admission medications   Medication Sig Start Date End Date Taking? Authorizing Provider  albuterol  (PROVENTIL ) (2.5 MG/3ML) 0.083% nebulizer solution Take 3 mLs (2.5 mg total) by nebulization every 6 (six) hours as needed for wheezing or shortness of breath. 10/12/21   Cobb, Comer GAILS, NP  albuterol  (VENTOLIN  HFA) 108 (90 Base) MCG/ACT inhaler Inhale 2 puffs into the lungs every 4 (four) hours as needed for wheezing or shortness of breath.  05/06/23   Vonna Sharlet POUR, MD  ALLERGY  RELIEF 180 MG tablet TAKE 1 TABLET(180 MG) BY MOUTH DAILY 03/24/23   Shelah Lamar RAMAN, MD  allopurinol  (ZYLOPRIM ) 300 MG tablet Take 300 mg by mouth daily.    [provider]  ALPRAZolam  (XANAX ) 0.5 MG tablet TAKE 1 TO 2 TABLETS BY MOUTH TWICE DAILY AS NEEDED FOR ANXIETY OR SLEEP 11/25/22   Norleen Lynwood ORN, MD  AMBULATORY NON FORMULARY MEDICATION Medication Name: Prednisone  Injection in knee    [provider]  azelastine  (ASTELIN ) 0.1 % nasal spray Place 2 sprays into both nostrils daily. Use in each nostril as directed 07/14/19   Norleen Lynwood ORN, MD  bacitracin  ointment Apply 1 Application topically daily. With dressing changes 08/28/23   Howell Lunger, DO  Biotin 10 MG TABS 1 tablet Orally Once a day    [provider]  bisacodyl  (DULCOLAX) 10 MG suppository Place 1 suppository (10 mg total) rectally as needed for moderate constipation. 12/09/22   Corey, Evan S, MD  budesonide -formoterol  (SYMBICORT ) 80-4.5 MCG/ACT inhaler 2 puffs Inhalation Twice a day    [provider]  co-enzyme Q-10 50 MG capsule Take 100 mg by mouth daily.    [provider]  colchicine 0.6 MG tablet Take 0.6 mg by mouth daily as needed (flare  up).    [provider]  cycloSPORINE  (RESTASIS ) 0.05 % ophthalmic emulsion Apply 1 drop to eye 2 (two) times daily as needed (allergies).    [provider]  doxycycline  (VIBRAMYCIN ) 100 MG capsule Take 1 capsule (100 mg total) by mouth 2 (two) times daily. 11/15/23   Raspet, Erin K, PA-C  EPINEPHrine  0.3 mg/0.3 mL IJ SOAJ injection Inject 0.3 mg into the muscle as needed for anaphylaxis. 12/29/19   [provider]  escitalopram  (LEXAPRO ) 10 MG tablet Take 1 tablet (10 mg total) by mouth daily. 09/05/23 09/04/24  Norleen Lynwood ORN, MD  Evolocumab  (REPATHA  SURECLICK) 140 MG/ML SOAJ Inject 140 mg into the skin every 14 (fourteen) days. 08/12/23   Swinyer, Rosaline HERO, NP  ezetimibe  (ZETIA ) 10  MG tablet TAKE 1 TABLET BY MOUTH EVERY DAY. TAKE ON TUESDAY AND FRIDAY 08/18/23   Norleen Lynwood ORN, MD  fluticasone  (FLONASE ) 50 MCG/ACT nasal spray Place 1 spray into both nostrils every morning.    [provider]  fluticasone -salmeterol (ADVAIR DISKUS) 500-50 MCG/ACT AEPB 1 puff Inhalation Twice a day    [provider]  fluticasone -salmeterol (ADVAIR) 500-50 MCG/ACT AEPB Inhale 1 puff into the lungs in the morning and at bedtime.    [provider]  furosemide  (LASIX ) 40 MG tablet 1 tablet Orally Once a day for 30 day(s)    [provider]  gabapentin  (NEURONTIN ) 300 MG capsule TAKE 1 CAPSULE(300 MG) BY MOUTH AT BEDTIME 12/11/23   Norleen Lynwood ORN, MD  Homeopathic Products (ARNICARE) GEL Apply 1 application  topically daily as needed (soreness).    [provider]  hydrochlorothiazide  (HYDRODIURIL ) 25 MG tablet TAKE 2 TABLETS(50 MG) BY MOUTH DAILY 05/09/23   Norleen Lynwood ORN, MD  ipratropium (ATROVENT ) 0.03 % nasal spray Place 2 sprays into both nostrils every 12 (twelve) hours as needed for rhinitis. 04/18/23   Shelah Lamar RAMAN, MD  levalbuterol  (XOPENEX  HFA) 45 MCG/ACT inhaler Inhale 2 puffs into the lungs every 6 (six) hours as needed for wheezing or shortness of breath. 09/30/19   Norleen Lynwood ORN, MD  levocetirizine (XYZAL ) 5 MG tablet Take 1 tablet (5 mg total) by mouth every evening. 06/24/19   Norleen Lynwood ORN, MD  levothyroxine  (SYNTHROID ) 75 MCG tablet TAKE 1 TABLET(75 MCG) BY MOUTH DAILY BEFORE BREAKFAST 06/25/23   Norleen Lynwood ORN, MD  metoprolol  succinate (TOPROL -XL) 25 MG 24 hr tablet TAKE 1/2 TABLET BY MOUTH EVERY DAY 10/27/23   Dann Candyce RAMAN, MD  mirtazapine  (REMERON ) 15 MG tablet Take 1 tablet (15 mg total) by mouth daily. Please call [phone] to schedule an office visit for more refills 12/03/23   Charlanne Groom, MD  montelukast  (SINGULAIR ) 10 MG tablet Take 1 tablet (10 mg total) by mouth at bedtime. 07/01/21   Pokhrel, Laxman, MD  mupirocin  ointment  (BACTROBAN ) 2 % Apply 1 Application topically 2 (two) times daily. 09/19/23   Raspet, Erin K, PA-C  nystatin cream (MYCOSTATIN) Apply topically. 03/09/14   [provider]  Omeprazole Magnesium (PRILOSEC) 10 MG PACK  1 packet mixed with 15 ml of water Orally Once a day for 30 day(s)    [provider]  ondansetron  (ZOFRAN ) 4 MG tablet TAKE 1 TABLET(4 MG) BY MOUTH EVERY 8 HOURS AS NEEDED FOR NAUSEA OR VOMITING 09/25/23   Norleen Lynwood ORN, MD  pantoprazole  (PROTONIX ) 40 MG tablet Take 1 tablet (40 mg total) by mouth daily. Please call [phone] to schedule an office visit for more refills 12/03/23   Charlanne Groom, MD  potassium chloride  SA (KLOR-CON  M) 20 MEQ tablet TAKE 1 TABLET BY MOUTH DAILY 08/15/23   Norleen Lynwood ORN, MD  rosuvastatin  (CRESTOR ) 10 MG tablet Take 1 tablet (10 mg total) by mouth 2 (two) times a week. 07/07/23   Swinyer, Rosaline HERO, NP  triamcinolone  cream (KENALOG ) 0.1 % Apply 1 Application topically 2 (two) times daily. 04/17/23 04/16/24  Norleen Lynwood ORN, MD  valACYclovir  (VALTREX ) 1000 MG tablet Take 2,000 mg by mouth 2 (two) times daily. 10/22/22   [provider]  XARELTO  20 MG TABS tablet TAKE 1 TABLET(20 MG) BY MOUTH DAILY WITH SUPPER 11/10/23   Dann Candyce RAMAN, MD  Zinc  50 MG TABS 1 tablet Orally Once a day    [provider]    Family History Family History  Problem Relation Age of Onset   Heart disease Father    Heart attack Father    Rectal cancer Father    Asthma Sister    Heart disease Brother    Heart attack Brother    Stomach cancer Neg Hx    Colon cancer Neg Hx     Social History Social History   Tobacco Use   Smoking status: Former    Current packs/day: 0.00    Average packs/day: 1 pack/day for 4.0 years (4.0 ttl pk-yrs)    Types: Cigarettes    Start date: 12/05/1958    Quit date: 12/05/1962    Years since quitting: 61.0   Smokeless tobacco: Never  Vaping Use   Vaping status: Never Used  Substance Use Topics   Alcohol  use: No   Drug use: No     Allergies   Crestor  [rosuvastatin  calcium ], Hydrocodone -acetaminophen , Lidocaine  hcl, Lipitor [atorvastatin  calcium ], Procaine, Rosuvastatin , Theophylline, Vicodin [hydrocodone -acetaminophen ], Codeine, Ephedrine, Other, Oxycodone, Oxycodone-acetaminophen , Propoxyphene, Quinine derivatives, Sulfa  antibiotics, Sulfur , Epinephrine , Penicillins, Quinine, and Theophyllines   Review of Systems Review of Systems Per HPI  Physical Exam Triage Vital Signs ED Triage Vitals  Encounter Vitals Group     BP 12/25/23 1231 118/79     Girls Systolic BP Percentile --      Girls Diastolic BP Percentile --      Boys Systolic BP Percentile --      Boys Diastolic BP Percentile --      Pulse Rate 12/25/23 1231 70     Resp 12/25/23 1231 16     Temp 12/25/23 1231 97.7 F (36.5 C)     Temp Source 12/25/23 1231 Oral     SpO2 12/25/23 1231 97 %     Weight --      Height --      Head Circumference --      Peak Flow --      Pain Score 12/25/23 1234 8     Pain Loc --      Pain Education --      Exclude from Growth Chart --    No data found.  Updated Vital Signs BP 118/79 (BP Location: Right Arm)   Pulse 70  Temp 97.7 F (36.5 C) (Oral)   Resp 16   SpO2 97%   Visual Acuity Right Eye Distance:   Left Eye Distance:   Bilateral Distance:    Right Eye Near:   Left Eye Near:    Bilateral Near:     Physical Exam Vitals and nursing note reviewed.  Constitutional:      Appearance: She is not ill-appearing or toxic-appearing.  HENT:     Head: Normocephalic and atraumatic.     Right Ear: Hearing and external ear normal.     Left Ear: Hearing and external ear normal.     Nose: Nose normal.     Mouth/Throat:     Lips: Pink.  Eyes:     General: Lids are normal. Vision grossly intact. Gaze aligned appropriately.     Extraocular Movements: Extraocular movements intact.     Conjunctiva/sclera: Conjunctivae normal.  Pulmonary:     Effort: Pulmonary effort is normal.   Musculoskeletal:     Cervical back: Normal and neck supple.     Thoracic back: Normal.     Lumbar back: Spasms and tenderness present. No swelling, edema, deformity, signs of trauma, lacerations or bony tenderness. Normal range of motion. No scoliosis.     Right lower leg: Edema present.     Left lower leg: Edema present.     Comments: Strength and sensation intact to bilateral upper and lower extremities (5/5). Moves all 4 extremities with normal coordination voluntarily. Non-focal neuro exam.   Skin:    General: Skin is warm and dry.     Capillary Refill: Capillary refill takes less than 2 seconds.     Findings: No rash.     Comments: Venous stasis changes and scant erythema to the anterior bilateral lower extremities at the shins.  No warmth or tenderness to palpation.  There is +1 bilateral pitting edema to the ankles.   Neurological:     General: No focal deficit present.     Mental Status: She is alert and oriented to person, place, and time. Mental status is at baseline.     Cranial Nerves: No dysarthria or facial asymmetry.  Psychiatric:        Mood and Affect: Mood normal.        Speech: Speech normal.        Behavior: Behavior normal.        Thought Content: Thought content normal.        Judgment: Judgment normal.      UC Treatments / Results  Labs (all labs ordered are listed, but only abnormal results are displayed) Labs Reviewed - No data to display  EKG   Radiology No results found.  Procedures Procedures (including critical care time)  Medications Ordered in UC Medications - No data to display  Initial Impression / Assessment and Plan / UC Course  I have reviewed the triage vital signs and the nursing notes.  Pertinent labs & imaging results that were available during my care of the patient were reviewed by me and considered in my medical decision making (see chart for details).   1. Venous stasis dermatitis, leg swelling Leg swelling and erythema of  concern is consistent with venous stasis dermatitis due to chronic vascular changes versus cellulitis. Bilateral nature of erythema/swelling and lack of warmth/open wound lead to lower suspicion for infectious causes of symptoms. Recommend compression socks, elevation, and follow-up with PCP for ongoing evaluation and recommendations. Recent labs in May 2025 were stable showing GFR  49 and creatinine 1.02. Echocardiogram in 2023 shows EF of 60-65%.    2. Spasm of lumbar paraspinous muscle Evaluation suggests pain to the low back is due to muscle spasm versus acute bony abnormality given location of pain and physical exam/history findings. No red flag findings or clinical need for imaging of the low back.  Recommend use of tylenol  PRN for pain.  Discussed using muscle relaxer, however we decided against this due to age and risk for falls/sedation.  We will trial use of heat and gentle ROM exercises, she may follow-up with PCP who may recommend low dose baclofen 5mg  if symptoms still haven't improved with use of conservative care.   Counseled patient on potential for adverse effects with medications prescribed/recommended today, strict ER and return-to-clinic precautions discussed, patient verbalized understanding.    Final Clinical Impressions(s) / UC Diagnoses   Final diagnoses:  Venous stasis dermatitis     Discharge Instructions      Use compression socks and elevate your legs to reduce swelling.  Tylenol  650mg  every 6 hours as needed for low back pain.   Use heat to the low back 20 minutes on 20 minutes off and do gentle exercises to alleviate pain.   If you develop any new or worsening symptoms or if your symptoms do not start to improve, please return here or follow-up with your primary care provider. If your symptoms are severe, please go to the emergency room.   ED Prescriptions   None    PDMP not reviewed this encounter.   Enedelia Dorna HERO, [place] 12/27/23 2044

## 2024-01-12 ENCOUNTER — Other Ambulatory Visit: Payer: Self-pay | Admitting: Internal Medicine

## 2024-01-13 ENCOUNTER — Emergency Department (HOSPITAL_COMMUNITY)

## 2024-01-13 ENCOUNTER — Inpatient Hospital Stay (HOSPITAL_COMMUNITY)
Admission: EM | Admit: 2024-01-13 | Discharge: 2024-01-16 | DRG: 481 | Disposition: A | Discharge status: Skilled Nursing Facility | Attending: Internal Medicine | Admitting: Internal Medicine

## 2024-01-13 ENCOUNTER — Encounter (HOSPITAL_COMMUNITY): Payer: Self-pay | Admitting: Internal Medicine

## 2024-01-13 DIAGNOSIS — M47812 Spondylosis without myelopathy or radiculopathy, cervical region: Secondary | ICD-10-CM | POA: Diagnosis not present

## 2024-01-13 DIAGNOSIS — R110 Nausea: Secondary | ICD-10-CM | POA: Diagnosis not present

## 2024-01-13 DIAGNOSIS — Z825 Family history of asthma and other chronic lower respiratory diseases: Secondary | ICD-10-CM

## 2024-01-13 DIAGNOSIS — M109 Gout, unspecified: Secondary | ICD-10-CM | POA: Diagnosis present

## 2024-01-13 DIAGNOSIS — I2510 Atherosclerotic heart disease of native coronary artery without angina pectoris: Secondary | ICD-10-CM | POA: Diagnosis present

## 2024-01-13 DIAGNOSIS — M8580 Other specified disorders of bone density and structure, unspecified site: Secondary | ICD-10-CM | POA: Diagnosis not present

## 2024-01-13 DIAGNOSIS — C50412 Malignant neoplasm of upper-outer quadrant of left female breast: Secondary | ICD-10-CM

## 2024-01-13 DIAGNOSIS — R252 Cramp and spasm: Secondary | ICD-10-CM | POA: Diagnosis present

## 2024-01-13 DIAGNOSIS — Z87891 Personal history of nicotine dependence: Secondary | ICD-10-CM | POA: Diagnosis not present

## 2024-01-13 DIAGNOSIS — Z741 Need for assistance with personal care: Secondary | ICD-10-CM | POA: Diagnosis not present

## 2024-01-13 DIAGNOSIS — Z800 Family history of malignant neoplasm of digestive organs: Secondary | ICD-10-CM

## 2024-01-13 DIAGNOSIS — G4733 Obstructive sleep apnea (adult) (pediatric): Secondary | ICD-10-CM | POA: Diagnosis present

## 2024-01-13 DIAGNOSIS — Z6835 Body mass index (BMI) 35.0-35.9, adult: Secondary | ICD-10-CM | POA: Diagnosis not present

## 2024-01-13 DIAGNOSIS — S12100A Unspecified displaced fracture of second cervical vertebra, initial encounter for closed fracture: Secondary | ICD-10-CM | POA: Diagnosis not present

## 2024-01-13 DIAGNOSIS — Z79890 Hormone replacement therapy (postmenopausal): Secondary | ICD-10-CM | POA: Diagnosis not present

## 2024-01-13 DIAGNOSIS — Z853 Personal history of malignant neoplasm of breast: Secondary | ICD-10-CM

## 2024-01-13 DIAGNOSIS — S72141A Displaced intertrochanteric fracture of right femur, initial encounter for closed fracture: Secondary | ICD-10-CM | POA: Diagnosis not present

## 2024-01-13 DIAGNOSIS — Z9049 Acquired absence of other specified parts of digestive tract: Secondary | ICD-10-CM

## 2024-01-13 DIAGNOSIS — Z7951 Long term (current) use of inhaled steroids: Secondary | ICD-10-CM

## 2024-01-13 DIAGNOSIS — Z880 Allergy status to penicillin: Secondary | ICD-10-CM

## 2024-01-13 DIAGNOSIS — Z7901 Long term (current) use of anticoagulants: Secondary | ICD-10-CM | POA: Diagnosis not present

## 2024-01-13 DIAGNOSIS — G629 Polyneuropathy, unspecified: Secondary | ICD-10-CM | POA: Diagnosis not present

## 2024-01-13 DIAGNOSIS — F32A Depression, unspecified: Secondary | ICD-10-CM | POA: Diagnosis present

## 2024-01-13 DIAGNOSIS — S0990XA Unspecified injury of head, initial encounter: Secondary | ICD-10-CM | POA: Diagnosis not present

## 2024-01-13 DIAGNOSIS — E876 Hypokalemia: Secondary | ICD-10-CM | POA: Diagnosis not present

## 2024-01-13 DIAGNOSIS — Z79899 Other long term (current) drug therapy: Secondary | ICD-10-CM

## 2024-01-13 DIAGNOSIS — M810 Age-related osteoporosis without current pathological fracture: Secondary | ICD-10-CM | POA: Diagnosis not present

## 2024-01-13 DIAGNOSIS — W010XXA Fall on same level from slipping, tripping and stumbling without subsequent striking against object, initial encounter: Secondary | ICD-10-CM | POA: Diagnosis present

## 2024-01-13 DIAGNOSIS — I499 Cardiac arrhythmia, unspecified: Secondary | ICD-10-CM | POA: Diagnosis not present

## 2024-01-13 DIAGNOSIS — Z9181 History of falling: Secondary | ICD-10-CM | POA: Diagnosis not present

## 2024-01-13 DIAGNOSIS — F419 Anxiety disorder, unspecified: Secondary | ICD-10-CM | POA: Diagnosis present

## 2024-01-13 DIAGNOSIS — D539 Nutritional anemia, unspecified: Secondary | ICD-10-CM | POA: Diagnosis present

## 2024-01-13 DIAGNOSIS — Z743 Need for continuous supervision: Secondary | ICD-10-CM | POA: Diagnosis not present

## 2024-01-13 DIAGNOSIS — E538 Deficiency of other specified B group vitamins: Secondary | ICD-10-CM | POA: Diagnosis not present

## 2024-01-13 DIAGNOSIS — E782 Mixed hyperlipidemia: Secondary | ICD-10-CM | POA: Diagnosis present

## 2024-01-13 DIAGNOSIS — D72828 Other elevated white blood cell count: Secondary | ICD-10-CM | POA: Diagnosis not present

## 2024-01-13 DIAGNOSIS — W19XXXA Unspecified fall, initial encounter: Secondary | ICD-10-CM | POA: Diagnosis not present

## 2024-01-13 DIAGNOSIS — G894 Chronic pain syndrome: Secondary | ICD-10-CM | POA: Diagnosis present

## 2024-01-13 DIAGNOSIS — M25572 Pain in left ankle and joints of left foot: Secondary | ICD-10-CM | POA: Diagnosis not present

## 2024-01-13 DIAGNOSIS — Z923 Personal history of irradiation: Secondary | ICD-10-CM

## 2024-01-13 DIAGNOSIS — M6281 Muscle weakness (generalized): Secondary | ICD-10-CM | POA: Diagnosis not present

## 2024-01-13 DIAGNOSIS — I517 Cardiomegaly: Secondary | ICD-10-CM | POA: Diagnosis not present

## 2024-01-13 DIAGNOSIS — R2689 Other abnormalities of gait and mobility: Secondary | ICD-10-CM | POA: Diagnosis not present

## 2024-01-13 DIAGNOSIS — I482 Chronic atrial fibrillation, unspecified: Secondary | ICD-10-CM | POA: Diagnosis present

## 2024-01-13 DIAGNOSIS — Z955 Presence of coronary angioplasty implant and graft: Secondary | ICD-10-CM

## 2024-01-13 DIAGNOSIS — Z885 Allergy status to narcotic agent status: Secondary | ICD-10-CM

## 2024-01-13 DIAGNOSIS — Y9301 Activity, walking, marching and hiking: Secondary | ICD-10-CM | POA: Diagnosis present

## 2024-01-13 DIAGNOSIS — M25551 Pain in right hip: Secondary | ICD-10-CM | POA: Diagnosis not present

## 2024-01-13 DIAGNOSIS — J4489 Other specified chronic obstructive pulmonary disease: Secondary | ICD-10-CM | POA: Diagnosis present

## 2024-01-13 DIAGNOSIS — Z8249 Family history of ischemic heart disease and other diseases of the circulatory system: Secondary | ICD-10-CM

## 2024-01-13 DIAGNOSIS — Z4789 Encounter for other orthopedic aftercare: Secondary | ICD-10-CM | POA: Diagnosis not present

## 2024-01-13 DIAGNOSIS — Y92017 Garden or yard in single-family (private) house as the place of occurrence of the external cause: Secondary | ICD-10-CM

## 2024-01-13 DIAGNOSIS — S79919A Unspecified injury of unspecified hip, initial encounter: Secondary | ICD-10-CM | POA: Diagnosis not present

## 2024-01-13 DIAGNOSIS — S72001A Fracture of unspecified part of neck of right femur, initial encounter for closed fracture: Secondary | ICD-10-CM

## 2024-01-13 DIAGNOSIS — S72009A Fracture of unspecified part of neck of unspecified femur, initial encounter for closed fracture: Secondary | ICD-10-CM | POA: Diagnosis present

## 2024-01-13 DIAGNOSIS — Z882 Allergy status to sulfonamides status: Secondary | ICD-10-CM

## 2024-01-13 DIAGNOSIS — I10 Essential (primary) hypertension: Secondary | ICD-10-CM | POA: Diagnosis not present

## 2024-01-13 DIAGNOSIS — E66812 Obesity, class 2: Secondary | ICD-10-CM | POA: Diagnosis present

## 2024-01-13 DIAGNOSIS — E039 Hypothyroidism, unspecified: Secondary | ICD-10-CM | POA: Diagnosis not present

## 2024-01-13 DIAGNOSIS — M170 Bilateral primary osteoarthritis of knee: Secondary | ICD-10-CM | POA: Diagnosis present

## 2024-01-13 DIAGNOSIS — I700 Atherosclerosis of aorta: Secondary | ICD-10-CM | POA: Diagnosis not present

## 2024-01-13 DIAGNOSIS — Z01818 Encounter for other preprocedural examination: Secondary | ICD-10-CM | POA: Diagnosis not present

## 2024-01-13 DIAGNOSIS — Z888 Allergy status to other drugs, medicaments and biological substances status: Secondary | ICD-10-CM

## 2024-01-13 DIAGNOSIS — M1990 Unspecified osteoarthritis, unspecified site: Secondary | ICD-10-CM | POA: Diagnosis not present

## 2024-01-13 DIAGNOSIS — J45909 Unspecified asthma, uncomplicated: Secondary | ICD-10-CM | POA: Diagnosis not present

## 2024-01-13 DIAGNOSIS — R0989 Other specified symptoms and signs involving the circulatory and respiratory systems: Secondary | ICD-10-CM | POA: Diagnosis not present

## 2024-01-13 DIAGNOSIS — Z884 Allergy status to anesthetic agent status: Secondary | ICD-10-CM

## 2024-01-13 DIAGNOSIS — R2681 Unsteadiness on feet: Secondary | ICD-10-CM | POA: Diagnosis not present

## 2024-01-13 DIAGNOSIS — S72001S Fracture of unspecified part of neck of right femur, sequela: Secondary | ICD-10-CM | POA: Diagnosis not present

## 2024-01-13 LAB — CBC WITH DIFFERENTIAL/PLATELET
Abs Immature Granulocytes: 0.05 K/uL (ref 0.00–0.07)
Basophils Absolute: 0 K/uL (ref 0.0–0.1)
Basophils Relative: 0 %
Eosinophils Absolute: 0.1 K/uL (ref 0.0–0.5)
Eosinophils Relative: 1 %
HCT: 35.8 % — ABNORMAL LOW (ref 36.0–46.0)
Hemoglobin: 11.5 g/dL — ABNORMAL LOW (ref 12.0–15.0)
Immature Granulocytes: 1 %
Lymphocytes Relative: 12 %
Lymphs Abs: 1 K/uL (ref 0.7–4.0)
MCH: 33 pg (ref 26.0–34.0)
MCHC: 32.1 g/dL (ref 30.0–36.0)
MCV: 102.9 fL — ABNORMAL HIGH (ref 80.0–100.0)
Monocytes Absolute: 0.6 K/uL (ref 0.1–1.0)
Monocytes Relative: 7 %
Neutro Abs: 6.5 K/uL (ref 1.7–7.7)
Neutrophils Relative %: 79 %
Platelets: 103 K/uL — ABNORMAL LOW (ref 150–400)
RBC: 3.48 MIL/uL — ABNORMAL LOW (ref 3.87–5.11)
RDW: 14.6 % (ref 11.5–15.5)
WBC: 8.2 K/uL (ref 4.0–10.5)
nRBC: 0 % (ref 0.0–0.2)

## 2024-01-13 LAB — COMPREHENSIVE METABOLIC PANEL WITH GFR
ALT: 10 U/L (ref 0–44)
AST: 24 U/L (ref 15–41)
Albumin: 3.1 g/dL — ABNORMAL LOW (ref 3.5–5.0)
Alkaline Phosphatase: 56 U/L (ref 38–126)
Anion gap: 12 (ref 5–15)
BUN: 22 mg/dL (ref 8–23)
CO2: 20 mmol/L — ABNORMAL LOW (ref 22–32)
Calcium: 8.4 mg/dL — ABNORMAL LOW (ref 8.9–10.3)
Chloride: 109 mmol/L (ref 98–111)
Creatinine, Ser: 0.95 mg/dL (ref 0.44–1.00)
GFR, Estimated: 58 mL/min — ABNORMAL LOW (ref >60)
Glucose, Bld: 101 mg/dL — ABNORMAL HIGH (ref 70–99)
Potassium: 3.7 mmol/L (ref 3.5–5.1)
Sodium: 141 mmol/L (ref 135–145)
Total Bilirubin: 1.1 mg/dL (ref 0.0–1.2)
Total Protein: 5.1 g/dL — ABNORMAL LOW (ref 6.5–8.1)

## 2024-01-13 LAB — PROTIME-INR
INR: 1.4 — ABNORMAL HIGH (ref 0.8–1.2)
Prothrombin Time: 17.5 s — ABNORMAL HIGH (ref 11.4–15.2)

## 2024-01-13 MED ORDER — METOPROLOL SUCCINATE ER 25 MG PO TB24
12.5000 mg | ORAL_TABLET | Freq: Every day | ORAL | Status: DC
  Administered 2024-01-14 – 2024-01-16 (×3): 12.5 mg via ORAL
  Filled 2024-01-13 (×3): qty 1

## 2024-01-13 MED ORDER — FENTANYL CITRATE PF 50 MCG/ML IJ SOSY
50.0000 ug | PREFILLED_SYRINGE | INTRAMUSCULAR | Status: DC | PRN
  Administered 2024-01-14 (×4): 50 ug via INTRAVENOUS
  Filled 2024-01-13 (×5): qty 1

## 2024-01-13 MED ORDER — FENTANYL CITRATE PF 50 MCG/ML IJ SOSY: 50.0000 ug | PREFILLED_SYRINGE | INTRAMUSCULAR | Status: DC | PRN

## 2024-01-13 MED ORDER — LEVALBUTEROL TARTRATE 45 MCG/ACT IN AERO: 2.0000 | INHALATION_SPRAY | Freq: Four times a day (QID) | RESPIRATORY_TRACT | Status: DC | PRN

## 2024-01-13 MED ORDER — TRAMADOL HCL 50 MG PO TABS
50.0000 mg | ORAL_TABLET | Freq: Once | ORAL | Status: AC
  Administered 2024-01-13: 50 mg via ORAL
  Filled 2024-01-13: qty 1

## 2024-01-13 MED ORDER — FENTANYL CITRATE PF 50 MCG/ML IJ SOSY
50.0000 ug | PREFILLED_SYRINGE | Freq: Once | INTRAMUSCULAR | Status: AC
  Administered 2024-01-13: 50 ug via INTRAVENOUS
  Filled 2024-01-13: qty 1

## 2024-01-13 MED ORDER — METHOCARBAMOL 500 MG PO TABS
500.0000 mg | ORAL_TABLET | Freq: Once | ORAL | Status: AC
  Administered 2024-01-13: 500 mg via ORAL
  Filled 2024-01-13: qty 1

## 2024-01-13 MED ORDER — ALLOPURINOL 300 MG PO TABS
300.0000 mg | ORAL_TABLET | Freq: Every day | ORAL | Status: DC
Start: 2024-01-14 — End: 2024-01-16
  Administered 2024-01-14 – 2024-01-16 (×3): 300 mg via ORAL
  Filled 2024-01-13: qty 1
  Filled 2024-01-13: qty 3
  Filled 2024-01-13: qty 1

## 2024-01-13 MED ORDER — LEVOTHYROXINE SODIUM 75 MCG PO TABS
75.0000 ug | ORAL_TABLET | Freq: Every day | ORAL | Status: DC
  Administered 2024-01-14 – 2024-01-16 (×3): 75 ug via ORAL
  Filled 2024-01-13 (×3): qty 1

## 2024-01-13 MED ORDER — GABAPENTIN 300 MG PO CAPS
300.0000 mg | ORAL_CAPSULE | Freq: Every day | ORAL | Status: DC
  Administered 2024-01-14 – 2024-01-15 (×3): 300 mg via ORAL
  Filled 2024-01-13 (×2): qty 1

## 2024-01-13 NOTE — ED Triage Notes (Signed)
 Pt had a mechanical fall while trying to check mail. She fell to her right side on her hip. Did not hit her head, no LOC. Pt does take Xerelto. She does have shortening and rotation of the right leg.   150/80 80 97% 124 CBG  22G L hand  Total Fent.

## 2024-01-13 NOTE — ED Provider Notes (Signed)
 [place] EMERGENCY DEPARTMENT AT [facility] Provider Note   CSN: [id number] Arrival date & time: 01/13/24  8060     Patient presents with: Fall (Pt went to get the mail and turned and fell and landed on her hip)   [name] is a 88 y.o. female.   88 year old female with a past medical history of asthma, hypertension, A-fib on Xarelto  presents to the ED via EMS status post mechanical fall.  Patient reports she was going out to get the mail, when one of her feral cats was about to be pet by the [facility], she tried to stop them, reports she slipped and fell on her right hip.  Reports she was unable to stand from the ground, has not ambulated since the injury occurred.  She was given fentanyl  by EMS.  He tells me she did not strike her head, did not hit her stomach, she did fall right on the right hip.  No loss of consciousness, no weakness, no dizziness, no other complaints reported.  The history is provided by the patient.  Fall Pertinent negatives include no chest pain, no abdominal pain, no headaches and no shortness of breath.       Prior to Admission medications   Medication Sig Start Date End Date Taking? Authorizing Provider  albuterol  (PROVENTIL ) (2.5 MG/3ML) 0.083% nebulizer solution Take 3 mLs (2.5 mg total) by nebulization every 6 (six) hours as needed for wheezing or shortness of breath. 10/12/21   Cobb, Comer GAILS, NP  albuterol  (VENTOLIN  HFA) 108 (90 Base) MCG/ACT inhaler Inhale 2 puffs into the lungs every 4 (four) hours as needed for wheezing or shortness of breath. 05/06/23   Vonna Sharlet POUR, MD  ALLERGY  RELIEF 180 MG tablet TAKE 1 TABLET(180 MG) BY MOUTH DAILY 03/24/23   Shelah Lamar RAMAN, MD  allopurinol  (ZYLOPRIM ) 300 MG tablet Take 300 mg by mouth daily.    [provider]  ALPRAZolam  (XANAX ) 0.5 MG tablet TAKE 1 TO 2 TABLETS BY MOUTH TWICE DAILY AS NEEDED FOR ANXIETY OR SLEEP 11/25/22   Norleen Lynwood ORN, MD  AMBULATORY NON FORMULARY MEDICATION  Medication Name: Prednisone  Injection in knee    [provider]  azelastine  (ASTELIN ) 0.1 % nasal spray Place 2 sprays into both nostrils daily. Use in each nostril as directed 07/14/19   Norleen Lynwood ORN, MD  bacitracin  ointment Apply 1 Application topically daily. With dressing changes 08/28/23   Howell Lunger, DO  Biotin 10 MG TABS 1 tablet Orally Once a day    [provider]  bisacodyl  (DULCOLAX) 10 MG suppository Place 1 suppository (10 mg total) rectally as needed for moderate constipation. 12/09/22   Joane Artist RAMAN, MD  budesonide -formoterol  (SYMBICORT ) 80-4.5 MCG/ACT inhaler 2 puffs Inhalation Twice a day    [provider]  co-enzyme Q-10 50 MG capsule Take 100 mg by mouth daily.    [provider]  colchicine 0.6 MG tablet Take 0.6 mg by mouth daily as needed (flare  up).    [provider]  cycloSPORINE  (RESTASIS ) 0.05 % ophthalmic emulsion Apply 1 drop to eye 2 (two) times daily as needed (allergies).    [provider]  doxycycline  (VIBRAMYCIN ) 100 MG capsule Take 1 capsule (100 mg total) by mouth 2 (two) times daily. 11/15/23   Raspet, Erin K, PA-C  EPINEPHrine  0.3 mg/0.3 mL IJ SOAJ injection Inject 0.3 mg into the muscle as needed for anaphylaxis. 12/29/19   [provider]  escitalopram  (LEXAPRO ) 10  MG tablet Take 1 tablet (10 mg total) by mouth daily. 09/05/23 09/04/24  Norleen Lynwood ORN, MD  Evolocumab  (REPATHA  SURECLICK) 140 MG/ML SOAJ Inject 140 mg into the skin every 14 (fourteen) days. 08/12/23   Swinyer, Rosaline HERO, NP  ezetimibe  (ZETIA ) 10 MG tablet TAKE 1 TABLET BY MOUTH EVERY DAY. TAKE ON TUESDAY AND FRIDAY 08/18/23   Norleen Lynwood ORN, MD  fluticasone  (FLONASE ) 50 MCG/ACT nasal spray Place 1 spray into both nostrils every morning.    [provider]  fluticasone -salmeterol (ADVAIR DISKUS) 500-50 MCG/ACT AEPB 1 puff Inhalation Twice a day    [provider]  fluticasone -salmeterol (ADVAIR) 500-50 MCG/ACT AEPB Inhale  1 puff into the lungs in the morning and at bedtime.    [provider]  furosemide  (LASIX ) 40 MG tablet 1 tablet Orally Once a day for 30 day(s)    [provider]  gabapentin  (NEURONTIN ) 300 MG capsule TAKE 1 CAPSULE(300 MG) BY MOUTH AT BEDTIME 12/11/23   Norleen Lynwood ORN, MD  Homeopathic Products (ARNICARE) GEL Apply 1 application  topically daily as needed (soreness).    [provider]  hydrochlorothiazide  (HYDRODIURIL ) 25 MG tablet TAKE 2 TABLETS(50 MG) BY MOUTH DAILY 01/12/24   Norleen Lynwood ORN, MD  ipratropium (ATROVENT ) 0.03 % nasal spray Place 2 sprays into both nostrils every 12 (twelve) hours as needed for rhinitis. 04/18/23   Shelah Lamar RAMAN, MD  levalbuterol  (XOPENEX  HFA) 45 MCG/ACT inhaler Inhale 2 puffs into the lungs every 6 (six) hours as needed for wheezing or shortness of breath. 09/30/19   Norleen Lynwood ORN, MD  levocetirizine (XYZAL ) 5 MG tablet Take 1 tablet (5 mg total) by mouth every evening. 06/24/19   Norleen Lynwood ORN, MD  levothyroxine  (SYNTHROID ) 75 MCG tablet TAKE 1 TABLET(75 MCG) BY MOUTH DAILY BEFORE BREAKFAST 01/12/24   Norleen Lynwood ORN, MD  metoprolol  succinate (TOPROL -XL) 25 MG 24 hr tablet TAKE 1/2 TABLET BY MOUTH EVERY DAY 10/27/23   Dann Candyce RAMAN, MD  mirtazapine  (REMERON ) 15 MG tablet Take 1 tablet (15 mg total) by mouth daily. Please call [phone] to schedule an office visit for more refills 12/03/23   Charlanne Groom, MD  montelukast  (SINGULAIR ) 10 MG tablet Take 1 tablet (10 mg total) by mouth at bedtime. 07/01/21   Pokhrel, Laxman, MD  mupirocin  ointment (BACTROBAN ) 2 % Apply 1 Application topically 2 (two) times daily. 09/19/23   Raspet, Erin K, PA-C  nystatin cream (MYCOSTATIN) Apply topically. 03/09/14   [provider]  Omeprazole Magnesium (PRILOSEC) 10 MG PACK 1 packet mixed with 15 ml of water Orally Once a day for 30 day(s)    [provider]  ondansetron  (ZOFRAN ) 4 MG tablet TAKE 1 TABLET(4 MG) BY MOUTH EVERY 8 HOURS AS  NEEDED FOR NAUSEA OR VOMITING 09/25/23   Norleen Lynwood ORN, MD  pantoprazole  (PROTONIX ) 40 MG tablet Take 1 tablet (40 mg total) by mouth daily. Please call [phone] to schedule an office visit for more refills 12/03/23   Charlanne Groom, MD  potassium chloride  SA (KLOR-CON  M) 20 MEQ tablet TAKE 1 TABLET BY MOUTH DAILY 08/15/23   Norleen Lynwood ORN, MD  rosuvastatin  (CRESTOR ) 10 MG tablet Take 1 tablet (10 mg total) by mouth 2 (two) times a week. 07/07/23   Swinyer, Rosaline HERO, NP  triamcinolone  cream (KENALOG ) 0.1 % Apply 1 Application topically 2 (two) times daily. 04/17/23 04/16/24  Norleen Lynwood ORN, MD  valACYclovir  (VALTREX ) 1000 MG tablet Take 2,000 mg by mouth 2 (two)  times daily. 10/22/22   [provider]  XARELTO  20 MG TABS tablet TAKE 1 TABLET(20 MG) BY MOUTH DAILY WITH SUPPER 11/10/23   Dann Candyce RAMAN, MD  Zinc  50 MG TABS 1 tablet Orally Once a day    [provider]    Allergies: Crestor  [rosuvastatin  calcium ], Hydrocodone -acetaminophen , Lidocaine  hcl, Lipitor [atorvastatin  calcium ], Procaine, Rosuvastatin , Theophylline, Vicodin [hydrocodone -acetaminophen ], Codeine, Ephedrine, Other, Oxycodone, Oxycodone-acetaminophen , Propoxyphene, Quinine derivatives, Sulfa  antibiotics, Sulfur , Epinephrine , Penicillins, Quinine, and Theophyllines    Review of Systems  Constitutional:  Negative for chills and fever.  HENT:  Negative for sore throat.   Respiratory:  Negative for shortness of breath.   Cardiovascular:  Negative for chest pain.  Gastrointestinal:  Negative for abdominal pain, nausea and vomiting.  Genitourinary:  Negative for dysuria.  Musculoskeletal:  Positive for arthralgias. Negative for back pain.  Neurological:  Negative for light-headedness and headaches.  All other systems reviewed and are negative.   Updated Vital Signs BP (!) 160/108   Pulse 77   Resp 20   Ht 5' 5 (1.651 m)   Wt 96.2 kg   SpO2 100%   BMI 35.28 kg/m   Physical Exam Vitals and nursing note  reviewed.  Constitutional:      Appearance: Normal appearance.  HENT:     Head: Normocephalic and atraumatic.     Comments: No obvious bruising or deformity palpated.    Mouth/Throat:     Mouth: Mucous membranes are moist.  Cardiovascular:     Rate and Rhythm: Normal rate.  Pulmonary:     Effort: Pulmonary effort is normal.  Abdominal:     General: Abdomen is flat.  Musculoskeletal:        General: Deformity present.     Cervical back: Normal range of motion and neck supple.     Comments: Right hip is shortened and internally rotated.  Skin:    General: Skin is warm and dry.  Neurological:     Mental Status: She is alert and oriented to person, place, and time.     (all labs ordered are listed, but only abnormal results are displayed) Labs Reviewed  CBC WITH DIFFERENTIAL/PLATELET - Abnormal; Notable for the following components:      Result Value   RBC 3.48 (*)    Hemoglobin 11.5 (*)    HCT 35.8 (*)    MCV 102.9 (*)    Platelets 103 (*)    All other components within normal limits  COMPREHENSIVE METABOLIC PANEL WITH GFR - Abnormal; Notable for the following components:   CO2 20 (*)    Glucose, Bld 101 (*)    Calcium  8.4 (*)    Total Protein 5.1 (*)    Albumin 3.1 (*)    GFR, Estimated 58 (*)    All other components within normal limits  PROTIME-INR - Abnormal; Notable for the following components:   Prothrombin Time 17.5 (*)    INR 1.4 (*)    All other components within normal limits    EKG: None  Radiology: CT HEAD WO CONTRAST ( ) Result Date: 01/13/2024 CLINICAL DATA:  Clem[place], head trauma, anticoagulated EXAM: CT HEAD WITHOUT CONTRAST TECHNIQUE: Contiguous axial images were obtained from the base of the skull through the vertex without intravenous contrast. RADIATION DOSE REDUCTION: This exam was performed according to the departmental dose-optimization program which includes automated exposure control, adjustment of the mA and/or kV according to patient size  and/or use of iterative reconstruction technique. COMPARISON:  10/15/2022, 01/17/2023 FINDINGS: Brain: Hypodensities  in the periventricular white matter are again noted, consistent with chronic small vessel ischemic changes. No evidence of acute infarct or hemorrhage. Lateral ventricles and midline structures are unremarkable. No acute extra-axial fluid collections. No mass effect. Vascular: No hyperdense vessel or unexpected calcification. Skull: Normal. Negative for fracture or focal lesion. Sinuses/Orbits: No acute finding. 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 IMPRESSION: 1. Stable head CT, no acute intracranial process. Electronically Signed   By: Ozell Daring M.D.   On: 01/13/2024 21:38   CT Cervical Spine Wo Contrast Result Date: 01/13/2024 EXAM: CT CERVICAL SPINE WITHOUT CONTRAST 01/13/2024 09:31:00 PM TECHNIQUE: CT of the cervical spine was performed without the administration of intravenous contrast. Multiplanar reformatted images are provided for review. Automated exposure control, iterative reconstruction, and/or weight based adjustment of the mA/kV was utilized to reduce the radiation dose to as low as reasonably achievable. COMPARISON: CT cervical spine 10/15/2022 and MRI cervical spine 01/17/2023. CLINICAL HISTORY: Neck trauma (Age >= 65y). Fall; Head trauma, minor, normal mental status (Age 48-64y); Hip trauma, fracture suspected, no prior imaging; Mechanical fall while trying to check mail. She fell to her right side on her hip. Did not hit her head, no LOC. Pt does take Xerelto. She does have shortening and rotation of the right leg. FINDINGS: CERVICAL SPINE: BONES AND ALIGNMENT: Chronic fracture through the base of the dens, not substantially changed compared to MRI 01/17/2023. No acute fracture. No evidence of traumatic malalignment. DEGENERATIVE CHANGES: Multilevel degenerative spondylosis and facet arthropathy. Disc space height loss is greatest  at C6-C7 where there is ankylosis of the vertebral bodies. No severe spinal canal narrowing. SOFT TISSUES: No prevertebral soft tissue swelling. IMPRESSION: 1. No acute fracture or traumatic malalignment. 2. Chronic fracture through the dens, not substantially changed compared to MRI 01/17/2023. Electronically signed by: [name] Gatlin MD 01/13/2024 09:38 PM EDT RP Workstation: [id number]   CT Hip Right Wo Contrast Result Date: 01/13/2024 CLINICAL DATA:  Trauma to the right hip. EXAM: CT OF THE RIGHT HIP WITHOUT CONTRAST TECHNIQUE: Multidetector CT imaging of the right hip was performed according to the standard protocol. Multiplanar CT image reconstructions were also generated. RADIATION DOSE REDUCTION: This exam was performed according to the departmental dose-optimization program which includes automated exposure control, adjustment of the mA and/or kV according to patient size and/or use of iterative reconstruction technique. COMPARISON:  None Available. FINDINGS: Bones/Joint/Cartilage There is a comminuted, mildly impacted, intertrochanteric fracture of the right femur with mild valgus angulation. No other acute fracture. The bones are osteopenic. No dislocation. No joint effusion. Ligaments Suboptimally assessed by CT. Muscles and Tendons No intramuscular fluid collection or hematoma. Soft tissues No acute findings. IMPRESSION: Comminuted, intertrochanteric fracture of the right femur. Electronically Signed   By: Vanetta Chou M.D.   On: 01/13/2024 21:38   DG Chest Port 1 View Result Date: 01/13/2024 EXAM: 1 VIEW XRAY OF THE CHEST 01/13/2024 08:51:00 PM COMPARISON: 05/06/2023 CLINICAL HISTORY: Pre-op chest exam FINDINGS: LUNGS AND PLEURA: Pulmonary vascular congestion. No focal pulmonary opacity. No pulmonary edema. No pleural effusion. No pneumothorax. HEART AND MEDIASTINUM: Stable cardiomegaly. Aortic atherosclerotic calcification. No acute abnormality of the cardiac and mediastinal silhouettes. BONES  AND SOFT TISSUES: No acute osseous abnormality. IMPRESSION: 1. Stable cardiomegaly. Pulmonary vascular congestion. Electronically signed by: [name] Gatlin MD 01/13/2024 08:58 PM EDT RP Workstation: [id number]     Procedures   Medications Ordered in the ED  fentaNYL  (SUBLIMAZE ) injection 50 mcg (has no administration in time range)  fentaNYL  (SUBLIMAZE ) injection 50 mcg (50 mcg Intravenous Given 01/13/24 2106)  methocarbamol  (ROBAXIN ) tablet 500 mg (500 mg Oral Given 01/13/24 2207)  traMADol  (ULTRAM ) tablet 50 mg (50 mg Oral Given 01/13/24 2207)                                    Medical Decision Making Amount and/or Complexity of Data Reviewed Labs: ordered. Radiology: ordered.  Risk Prescription drug management. Decision regarding hospitalization.   This patient presents to the ED for concern of fall, this involves a number of treatment options, and is a complaint that carries with it a high risk of complications and morbidity.  The differential diagnosis includes hip fracture versus dislocation versus mechanical or dizziness.    Co morbidities: Discussed in HPI   Brief History:  See HPI.   EMR reviewed including pt PMHx, past surgical history and past visits to ER.   See HPI for more details  Lab Tests:  I ordered and independently interpreted labs.  The pertinent results include:    I personally reviewed all laboratory work and imaging. Metabolic panel without any acute abnormality specifically kidney function within normal limits and no significant electrolyte abnormalities. CBC without leukocytosis or significant anemia.  Imaging Studies:  Xray of the chest with some vascular congestion, stable cardiomegaly.  CT Head and CT cervical spine are normal. CT RIGHT HIP IMPRESSION:  Comminuted, intertrochanteric fracture of the right femur.   Medicines ordered:  I ordered medication including fentanyl   for pain control  Reevaluation of the patient after these  medicines showed that the patient improved I have reviewed the patients home medicines and have made adjustments as needed  Consults:  I requested consultation with Orthopedic surgery Dr. Calton,  and discussed lab and imaging findings as well as pertinent plan - they recommend: Admission via hospitalist service.  N.p.o. after midnight.  Reevaluation:  After the interventions noted above I re-evaluated patient and found that they have :stayed the same  Social Determinants of Health:  The patient's social determinants of health were a factor in the care of this patient  Problem List / ED Course:  Patient presents to the ED status post mechanical fall while getting the mail.  Did not hit her head, there was no loss of consciousness.  CT of her head along with CT cervical spine was within normal limits.  She does tell me that she is having some right sided hip spasms.  She does have a right hip that appears shortened and internally rotated.  Patient is on Xarelto , unsure of her last dose.  She has received fentanyl  by EMS, given additional fentanyl  here along with some Robaxin . PDMP was reviewed she has previously taken tramadol  therefore given this medication while on today's visit.  Blood work was obtained to screen her for likely intervention via OR.  CBC is unremarkable.  CMP with normal creatinine, no electrolyte derangement.  LFTs are within normal limits.  PT and INR slightly elevated. I discussed the case with orthopedics who recommended admission via hospitalist, will need to be n.p.o. after midnight. Spoke to Dr. Franky of hospital service who agrees on admission at this time.  She remains hemodynamically stable for admission.  Dispostion:  After consideration of the diagnostic results and the patients response to treatment, I feel that the patent would benefit from admission for further management of right hip fracture.    Portions of  this note were generated with Administrator, sports. Dictation errors may occur despite best attempts at proofreading.   Final diagnoses:  Fall, initial encounter  Closed fracture of right hip, initial encounter [facility])    ED Discharge Orders     None          Maureen Broad, DEVONNA 01/13/24 2213    Melvenia Motto, MD 01/13/24 2312

## 2024-01-13 NOTE — H&P (Incomplete)
 History and Physical    [name] FMW:[id number] DOB: [birth year] DOA: 01/13/2024  Patient coming from: Home.  Chief Complaint: Fall.  HPI: PORTER NAKAMA is a 88 y.o. female with history of CAD status post PCI, atrial fibrillation, hyperlipidemia, sleep apnea, gout, hypothyroidism, macrocytic anemia was brought to the ER after patient had a fall.  Patient states she was picking the mail when she slipped and fell.  Did not hit her head.  Did not lose consciousness.  Patient states her last dose of Xarelto  was yesterday more than 24 hours ago.  ED Course: In the ER scan showed a right hip fracture.  Orthopedic on-call was consulted.  Labs show hemoglobin of 11.5 macrocytic picture.  Creatinine 0.9.  Platelets 103.  Patient admitted for right hip fracture.  Review of Systems: As per HPI, rest all negative.   Past Medical History:  Diagnosis Date   Allergy     Anxiety    Arthritis    no cartlidge in knees   Asthma    Bradycardia    a. atenolol  stopped due to this, HR 40s.   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  CAD s/p RCA stent 2000 with residual mild-mod disease of left system 2001.   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 Her smoking use included cigarettes. She started smoking about 65 years ago. She has a 4 pack-year smoking history. She has never used smokeless tobacco. She reports that she does not drink alcohol and does not use drugs.  Allergies  Allergen Reactions   Crestor  [Rosuvastatin  Calcium ]     Severe liver function problems   Hydrocodone -Acetaminophen  Anxiety and Itching   Lidocaine  Hcl Other (See Comments)    Novacaine:  Becomes very shaky   Lipitor [Atorvastatin  Calcium ] Other (See Comments)    Elevated liver function    Procaine Other (See Comments)    shaking   Rosuvastatin  Other (See Comments)    Severe liver function problems   Theophylline Other (See Comments)    Becomes very shaky skaking skaking   Vicodin [Hydrocodone -Acetaminophen ] Itching    Itching all over   Codeine Nausea Only, Anxiety and Other (See Comments)    Also complained of dizziness.  Has same problems with oxycodone and hydrocodone  Visual Disturbance, Balance Difficulty, Dizzy Room Spinning; Swimming Balance, vision, nausea, dizzy, swimming, room spinning Balance, vision, nausea, dizzy, swimming,  room spinning   Ephedrine Other (See Comments)    shaking   Other Other (See Comments)    Quinine causes nausea, dizzy   Oxycodone Other (See Comments)    Balance, vision, nausea, dizzy, swimming, room spinning   Oxycodone-Acetaminophen  Nausea Only, Anxiety and Other (See Comments)    Visual Disturbance, Balance Difficulty, Dizzy Room Spinning; Swimming   Propoxyphene Anxiety, Nausea Only and Other (See Comments)    Visual Disturbance, Balance Difficulty, Dizzy Room Spinning; Swimming Balance, vision, nausea, dizzy, swimming, room spinning Balance, vision, nausea, dizzy, swimming, room spinning   Quinine  Derivatives Nausea Only    dizzy   Sulfa  Antibiotics Nausea Only    Also complained of dizziness   Sulfur  Other (See Comments)   Epinephrine  Other (See Comments)    Patient becomes shaky shaking shaking   Penicillins Rash    Pt reported all over body rash in 1958 Has patient had a PCN reaction causing immediate rash, facial/tongue/throat swelling, SOB or lightheadedness with hypotension:Yes Has patient had a PCN reaction causing severe rash involving mucus membranes or skin necrosis: No Has patient had a PCN reaction that required hospitalization: No Has patient had a PCN reaction occurring within the last 10 years:No     Quinine Other (See Comments)   Theophyllines Other (See Comments)    Patient becomes shaky    Family History  Problem Relation Age of Onset   Heart disease Father    Heart attack Father    Rectal cancer Father    Asthma Sister    Heart disease Brother    Heart attack Brother    Stomach cancer Neg Hx    Colon cancer Neg Hx     Prior to Admission medications   Medication Sig Start Date End Date Taking? Authorizing Provider  albuterol  (PROVENTIL ) (2.5 MG/3ML) 0.083% nebulizer solution Take 3 mLs (2.5 mg total) by nebulization every 6 (six) hours as needed for wheezing or shortness of breath. 10/12/21   Cobb, Comer GAILS, NP  albuterol  (VENTOLIN  HFA) 108 (90 Base) MCG/ACT inhaler Inhale 2 puffs into the lungs every 4 (four) hours as needed for wheezing or shortness of breath. 05/06/23   Vonna Sharlet POUR, MD  ALLERGY  RELIEF 180 MG tablet TAKE 1 TABLET(180 MG) BY MOUTH DAILY 03/24/23   Shelah Lamar RAMAN, MD  allopurinol  (ZYLOPRIM ) 300 MG tablet Take 300 mg by mouth daily.    [provider]  ALPRAZolam  (XANAX ) 0.5 MG tablet TAKE 1 TO 2 TABLETS BY MOUTH TWICE DAILY AS NEEDED FOR ANXIETY OR SLEEP 11/25/22   Norleen Lynwood ORN, MD  AMBULATORY NON FORMULARY MEDICATION Medication Name: Prednisone  Injection in knee    [provider]  azelastine  (ASTELIN )  0.1 % nasal spray Place 2 sprays into both nostrils daily. Use in each nostril as directed 07/14/19   Norleen Lynwood ORN, MD  bacitracin  ointment Apply 1 Application topically daily. With dressing changes 08/28/23   Howell Lunger, DO  Biotin 10 MG TABS 1 tablet Orally Once a day    [provider]  bisacodyl  (DULCOLAX) 10 MG suppository Place 1 suppository (10 mg total) rectally as needed for moderate constipation. 12/09/22   Corey, Evan S, MD  budesonide -formoterol  (SYMBICORT ) 80-4.5 MCG/ACT inhaler 2 puffs Inhalation Twice a day    [provider]  co-enzyme Q-10 50 MG capsule Take 100 mg by mouth daily.    [provider]  colchicine 0.6 MG tablet Take 0.6 mg by mouth daily as needed (flare  up).  [provider]  cycloSPORINE  (RESTASIS ) 0.05 % ophthalmic emulsion Apply 1 drop to eye 2 (two) times daily as needed (allergies).    [provider]  doxycycline  (VIBRAMYCIN ) 100 MG capsule Take 1 capsule (100 mg total) by mouth 2 (two) times daily. 11/15/23   Raspet, Erin K, PA-C  EPINEPHrine  0.3 mg/0.3 mL IJ SOAJ injection Inject 0.3 mg into the muscle as needed for anaphylaxis. 12/29/19   [provider]  escitalopram  (LEXAPRO ) 10 MG tablet Take 1 tablet (10 mg total) by mouth daily. 09/05/23 09/04/24  Norleen Lynwood ORN, MD  Evolocumab  (REPATHA  SURECLICK) 140 MG/ML SOAJ Inject 140 mg into the skin every 14 (fourteen) days. 08/12/23   Swinyer, Rosaline HERO, NP  ezetimibe  (ZETIA ) 10 MG tablet TAKE 1 TABLET BY MOUTH EVERY DAY. TAKE ON TUESDAY AND FRIDAY 08/18/23   Norleen Lynwood ORN, MD  fluticasone  (FLONASE ) 50 MCG/ACT nasal spray Place 1 spray into both nostrils every morning.    [provider]  fluticasone -salmeterol (ADVAIR DISKUS) 500-50 MCG/ACT AEPB 1 puff Inhalation Twice a day    [provider]  fluticasone -salmeterol (ADVAIR) 500-50 MCG/ACT AEPB Inhale 1 puff into the lungs in the morning and at bedtime.    [provider]  furosemide   (LASIX ) 40 MG tablet 1 tablet Orally Once a day for 30 day(s)    [provider]  gabapentin  (NEURONTIN ) 300 MG capsule TAKE 1 CAPSULE(300 MG) BY MOUTH AT BEDTIME 12/11/23   Norleen Lynwood ORN, MD  Homeopathic Products (ARNICARE) GEL Apply 1 application  topically daily as needed (soreness).    [provider]  hydrochlorothiazide  (HYDRODIURIL ) 25 MG tablet TAKE 2 TABLETS(50 MG) BY MOUTH DAILY 01/12/24   Norleen Lynwood ORN, MD  ipratropium (ATROVENT ) 0.03 % nasal spray Place 2 sprays into both nostrils every 12 (twelve) hours as needed for rhinitis. 04/18/23   Shelah Lamar RAMAN, MD  levalbuterol  (XOPENEX  HFA) 45 MCG/ACT inhaler Inhale 2 puffs into the lungs every 6 (six) hours as needed for wheezing or shortness of breath. 09/30/19   Norleen Lynwood ORN, MD  levocetirizine (XYZAL ) 5 MG tablet Take 1 tablet (5 mg total) by mouth every evening. 06/24/19   Norleen Lynwood ORN, MD  levothyroxine  (SYNTHROID ) 75 MCG tablet TAKE 1 TABLET(75 MCG) BY MOUTH DAILY BEFORE BREAKFAST 01/12/24   Norleen Lynwood ORN, MD  metoprolol  succinate (TOPROL -XL) 25 MG 24 hr tablet TAKE 1/2 TABLET BY MOUTH EVERY DAY 10/27/23   Dann Candyce RAMAN, MD  mirtazapine  (REMERON ) 15 MG tablet Take 1 tablet (15 mg total) by mouth daily. Please call [phone] to schedule an office visit for more refills 12/03/23   Charlanne Groom, MD  montelukast  (SINGULAIR ) 10 MG tablet Take 1 tablet (10 mg total) by mouth at bedtime. 07/01/21   Pokhrel, Laxman, MD  mupirocin  ointment (BACTROBAN ) 2 % Apply 1 Application topically 2 (two) times daily. 09/19/23   Raspet, Erin K, PA-C  nystatin cream (MYCOSTATIN) Apply topically. 03/09/14   [provider]  Omeprazole Magnesium (PRILOSEC) 10 MG PACK 1 packet mixed with 15 ml of water Orally Once a day for 30 day(s)    [provider]  ondansetron  (ZOFRAN ) 4 MG tablet TAKE 1 TABLET(4 MG) BY MOUTH EVERY 8 HOURS AS NEEDED FOR NAUSEA OR VOMITING 09/25/23   Norleen Lynwood ORN, MD  pantoprazole  (PROTONIX ) 40 MG tablet  Take 1 tablet (40 mg total) by mouth daily. Please call [phone] to schedule an office visit for more refills 12/03/23   Charlanne Groom, MD  potassium chloride  SA (KLOR-CON  M) 20 MEQ tablet TAKE 1 TABLET BY MOUTH DAILY 08/15/23   Norleen Lynwood ORN, MD  rosuvastatin  (CRESTOR ) 10 MG tablet Take 1 tablet (10 mg total) by mouth 2 (two) times a week. 07/07/23   Swinyer, Rosaline HERO, NP  triamcinolone  cream (KENALOG ) 0.1 % Apply 1 Application topically 2 (two) times daily. 04/17/23 04/16/24  Norleen Lynwood ORN, MD  valACYclovir  (VALTREX ) 1000 MG tablet Take 2,000 mg by mouth 2 (two) times daily. 10/22/22   [provider]  XARELTO  20 MG TABS tablet TAKE 1 TABLET(20 MG) BY MOUTH DAILY WITH SUPPER 11/10/23   Dann Candyce RAMAN, MD  Zinc  50 MG TABS 1 tablet Orally Once a day    [provider]    Physical Exam: Constitutional: Moderately built and nourished. Vitals:   01/13/24 2050 01/13/24 2052  BP:  (!) 160/108  Pulse:  77  Resp:  20  TempSrc:  Oral  SpO2:  100%  Weight: 96.2 kg   Height: 5' 5 (1.651 m)    Eyes: Anicteric no pallor. ENMT: No discharge from the ears eyes nose or mouth. Neck: No neck rigidity.  No mass felt. Respiratory: No rhonchi or crepitations. Cardiovascular: S1-S2 heard. Abdomen: Soft nontender bowel sound present. Musculoskeletal: Pain on moving right hip. Skin: No rash. Neurologic: Alert awake oriented to time place and person.  Moves all extremities. Psychiatric: Appears normal.  Normal affect.   Labs on Admission: I have personally reviewed following labs and imaging studies  CBC: Recent Labs  Lab 01/13/24 2012  WBC 8.2  NEUTROABS 6.5  HGB 11.5*  HCT 35.8*  MCV 102.9*  PLT 103*   Basic Metabolic Panel: Recent Labs  Lab 01/13/24 2012  NA 141  K 3.7  CL 109  CO2 20*  GLUCOSE 101*  BUN 22  CREATININE 0.95  CALCIUM  8.4*   GFR: Estimated Creatinine Clearance: 48.8 mL/min (by C-G formula based on SCr of 0.95 mg/dL). Liver Function  Tests: Recent Labs  Lab 01/13/24 2012  AST 24  ALT 10  ALKPHOS 56  BILITOT 1.1  PROT 5.1*  ALBUMIN 3.1*   No results for input(s): LIPASE, AMYLASE in the last 168 hours. No results for input(s): AMMONIA in the last 168 hours. Coagulation Profile: Recent Labs  Lab 01/13/24 2012  INR 1.4*   Cardiac Enzymes: No results for input(s): CKTOTAL, CKMB, CKMBINDEX, TROPONINI in the last 168 hours. BNP (last 3 results) No results for input(s): PROBNP in the last 8760 hours. HbA1C: No results for input(s): HGBA1C in the last 72 hours. CBG: No results for input(s): GLUCAP in the last 168 hours. Lipid Profile: No results for input(s): CHOL, HDL, LDLCALC, TRIG, CHOLHDL, LDLDIRECT in the last 72 hours. Thyroid  Function Tests: No results for input(s): TSH, T4TOTAL, FREET4, T3FREE, THYROIDAB in the last 72 hours. Anemia Panel: No results for input(s): VITAMINB12, FOLATE, FERRITIN, TIBC, IRON, RETICCTPCT in the last 72 hours. Urine analysis:    Component Value Date/Time   COLORURINE YELLOW 09/09/2023 1558   APPEARANCEUR Cloudy (A) 09/09/2023 1558   LABSPEC 1.025 09/09/2023 1558   PHURINE 6.0 09/09/2023 1558   GLUCOSEU NEGATIVE 09/09/2023 1558   HGBUR NEGATIVE 09/09/2023 1558   BILIRUBINUR NEGATIVE 09/09/2023 1558   BILIRUBINUR negative 04/26/2016 1526   KETONESUR NEGATIVE 09/09/2023 1558   PROTEINUR NEGATIVE 06/12/2021 1458   UROBILINOGEN 1.0 09/09/2023 1558   NITRITE POSITIVE (A) 09/09/2023 1558   LEUKOCYTESUR TRACE (A) 09/09/2023 1558   Sepsis Labs: @LABRCNTIP (procalcitonin:4,lacticidven:4) )No results found for  this or any previous visit (from the past 240 hours).   Radiological Exams on Admission: CT HEAD WO CONTRAST ( ) Result Date: 01/13/2024 CLINICAL DATA:  Clemens, head trauma, anticoagulated EXAM: CT HEAD WITHOUT CONTRAST TECHNIQUE: Contiguous axial images were obtained from the base of the skull through the vertex  without intravenous contrast. RADIATION DOSE REDUCTION: This exam was performed according to the departmental dose-optimization program which includes automated exposure control, adjustment of the mA and/or kV according to patient size and/or use of iterative reconstruction technique. COMPARISON:  10/15/2022, 01/17/2023 FINDINGS: Brain: Hypodensities in the periventricular white matter are again noted, consistent with chronic small vessel ischemic changes. No evidence of acute infarct or hemorrhage. Lateral ventricles and midline structures are unremarkable. No acute extra-axial fluid collections. No mass effect. Vascular: No hyperdense vessel or unexpected calcification. Skull: Normal. Negative for fracture or focal lesion. Sinuses/Orbits: No acute finding. 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 IMPRESSION: 1. Stable head CT, no acute intracranial process. Electronically Signed   By: Ozell Daring M.D.   On: 01/13/2024 21:38   CT Cervical Spine Wo Contrast Result Date: 01/13/2024 EXAM: CT CERVICAL SPINE WITHOUT CONTRAST 01/13/2024 09:31:00 PM TECHNIQUE: CT of the cervical spine was performed without the administration of intravenous contrast. Multiplanar reformatted images are provided for review. Automated exposure control, iterative reconstruction, and/or weight based adjustment of the mA/kV was utilized to reduce the radiation dose to as low as reasonably achievable. COMPARISON: CT cervical spine 10/15/2022 and MRI cervical spine 01/17/2023. CLINICAL HISTORY: Neck trauma (Age >= 65y). Fall; Head trauma, minor, normal mental status (Age 38-64y); Hip trauma, fracture suspected, no prior imaging; Mechanical fall while trying to check mail. She fell to her right side on her hip. Did not hit her head, no LOC. Pt does take Xerelto. She does have shortening and rotation of the right leg. FINDINGS: CERVICAL SPINE: BONES AND ALIGNMENT: Chronic fracture through the base of the  dens, not substantially changed compared to MRI 01/17/2023. No acute fracture. No evidence of traumatic malalignment. DEGENERATIVE CHANGES: Multilevel degenerative spondylosis and facet arthropathy. Disc space height loss is greatest at C6-C7 where there is ankylosis of the vertebral bodies. No severe spinal canal narrowing. SOFT TISSUES: No prevertebral soft tissue swelling. IMPRESSION: 1. No acute fracture or traumatic malalignment. 2. Chronic fracture through the dens, not substantially changed compared to MRI 01/17/2023. Electronically signed by: [name] Gatlin MD 01/13/2024 09:38 PM EDT RP Workstation: [id number]   CT Hip Right Wo Contrast Result Date: 01/13/2024 CLINICAL DATA:  Trauma to the right hip. EXAM: CT OF THE RIGHT HIP WITHOUT CONTRAST TECHNIQUE: Multidetector CT imaging of the right hip was performed according to the standard protocol. Multiplanar CT image reconstructions were also generated. RADIATION DOSE REDUCTION: This exam was performed according to the departmental dose-optimization program which includes automated exposure control, adjustment of the mA and/or kV according to patient size and/or use of iterative reconstruction technique. COMPARISON:  None Available. FINDINGS: Bones/Joint/Cartilage There is a comminuted, mildly impacted, intertrochanteric fracture of the right femur with mild valgus angulation. No other acute fracture. The bones are osteopenic. No dislocation. No joint effusion. Ligaments Suboptimally assessed by CT. Muscles and Tendons No intramuscular fluid collection or hematoma. Soft tissues No acute findings. IMPRESSION: Comminuted, intertrochanteric fracture of the right femur. Electronically Signed   By: Vanetta Chou M.D.   On: 01/13/2024 21:38   DG Chest Port 1 View Result Date: 01/13/2024 EXAM: 1 VIEW XRAY OF THE CHEST 01/13/2024 08:51:00 PM  COMPARISON: 05/06/2023 CLINICAL HISTORY: Pre-op chest exam FINDINGS: LUNGS AND PLEURA: Pulmonary vascular congestion. No  focal pulmonary opacity. No pulmonary edema. No pleural effusion. No pneumothorax. HEART AND MEDIASTINUM: Stable cardiomegaly. Aortic atherosclerotic calcification. No acute abnormality of the cardiac and mediastinal silhouettes. BONES AND SOFT TISSUES: No acute osseous abnormality. IMPRESSION: 1. Stable cardiomegaly. Pulmonary vascular congestion. Electronically signed by: [name] Gatlin MD 01/13/2024 08:58 PM EDT RP Workstation: [id number]    EKG: Independently reviewed.  A-fib rate controlled.  Assessment/Plan Principal Problem:   Hip fracture (HCC) Active Problems:   Malignant neoplasm of upper-outer quadrant of left breast in female, estrogen receptor positive (HCC)   Hypothyroidism   Gout   Essential hypertension   Atrial fibrillation, chronic (HCC)   Mixed hyperlipidemia    Right hip fracture after mechanical fall.  Orthopedic on-call was consulted.  Patient has had xarelto  more than 24 hours ago.  Hold Xarelto  in anticipation of surgery.  Patient agreeable in holding Xarelto .  N.p.o. past midnight in anticipation of surgery.  Pain relief medications. CAD status post PCI denies any chest pain.  Continue beta-blockers and patient also takes statins and Zetia .  Patient states she no longer takes Repatha .  Xarelto  on hold due to possible surgery. Atrial fibrillation rate controlled.  Continue beta-blockers.  Xarelto  on hold in anticipation of surgery. Macrocytic anemia check B12 and folate levels. History of gout on allopurinol . Hypothyroidism on Synthroid . Sleep apnea on CPAP at bedtime. Chronic pain on gabapentin  confirmed on PDMP website. History of anxiety on Lexapro . Asthma takes as needed Symbicort . Morbid obesity patient states she has significant loss of weight after her husband passed 2 years ago.  Being followed by PCP. Hypertension continue beta-blockers restart hydrochlorothiazide  after surgery.  Since patient has hip fracture will need close monitoring further workup  and more than 2 midnight stay.   DVT prophylaxis: SCDs.  Pharmacological DVT prophylaxis to be decided by orthopedics. Code Status: Full code. Family Communication: Patient's daughter. Disposition Plan: Monitored bed. Consults called: Orthopedics. Admission status: Inpatient.

## 2024-01-14 ENCOUNTER — Encounter (HOSPITAL_COMMUNITY): Payer: Self-pay | Admitting: Internal Medicine

## 2024-01-14 ENCOUNTER — Inpatient Hospital Stay (HOSPITAL_COMMUNITY): Admitting: Certified Registered Nurse Anesthetist

## 2024-01-14 ENCOUNTER — Other Ambulatory Visit: Payer: Self-pay

## 2024-01-14 ENCOUNTER — Inpatient Hospital Stay (HOSPITAL_COMMUNITY)

## 2024-01-14 ENCOUNTER — Encounter (HOSPITAL_COMMUNITY)
Admission: EM | Disposition: A | Discharge status: Skilled Nursing Facility | Payer: Self-pay | Source: Home / Self Care | Attending: Internal Medicine

## 2024-01-14 DIAGNOSIS — S72141A Displaced intertrochanteric fracture of right femur, initial encounter for closed fracture: Secondary | ICD-10-CM | POA: Diagnosis not present

## 2024-01-14 DIAGNOSIS — S72001A Fracture of unspecified part of neck of right femur, initial encounter for closed fracture: Secondary | ICD-10-CM | POA: Diagnosis not present

## 2024-01-14 DIAGNOSIS — I10 Essential (primary) hypertension: Secondary | ICD-10-CM

## 2024-01-14 DIAGNOSIS — Z87891 Personal history of nicotine dependence: Secondary | ICD-10-CM

## 2024-01-14 DIAGNOSIS — I2510 Atherosclerotic heart disease of native coronary artery without angina pectoris: Secondary | ICD-10-CM

## 2024-01-14 HISTORY — PX: INTRAMEDULLARY (IM) NAIL INTERTROCHANTERIC: SHX5875

## 2024-01-14 LAB — BASIC METABOLIC PANEL WITH GFR
Anion gap: 8 (ref 5–15)
BUN: 21 mg/dL (ref 8–23)
CO2: 26 mmol/L (ref 22–32)
Calcium: 8.8 mg/dL — ABNORMAL LOW (ref 8.9–10.3)
Chloride: 106 mmol/L (ref 98–111)
Creatinine, Ser: 1.02 mg/dL — ABNORMAL HIGH (ref 0.44–1.00)
GFR, Estimated: 54 mL/min — ABNORMAL LOW (ref >60)
Glucose, Bld: 125 mg/dL — ABNORMAL HIGH (ref 70–99)
Potassium: 3.3 mmol/L — ABNORMAL LOW (ref 3.5–5.1)
Sodium: 140 mmol/L (ref 135–145)

## 2024-01-14 LAB — CBC
HCT: 34.2 % — ABNORMAL LOW (ref 36.0–46.0)
Hemoglobin: 11.2 g/dL — ABNORMAL LOW (ref 12.0–15.0)
MCH: 32.8 pg (ref 26.0–34.0)
MCHC: 32.7 g/dL (ref 30.0–36.0)
MCV: 100.3 fL — ABNORMAL HIGH (ref 80.0–100.0)
Platelets: 100 K/uL — ABNORMAL LOW (ref 150–400)
RBC: 3.41 MIL/uL — ABNORMAL LOW (ref 3.87–5.11)
RDW: 14.8 % (ref 11.5–15.5)
WBC: 8.2 K/uL (ref 4.0–10.5)
nRBC: 0 % (ref 0.0–0.2)

## 2024-01-14 LAB — MRSA NEXT GEN BY PCR, NASAL: MRSA by PCR Next Gen: NOT DETECTED

## 2024-01-14 LAB — MAGNESIUM: Magnesium: 1.8 mg/dL (ref 1.7–2.4)

## 2024-01-14 LAB — VITAMIN B12: Vitamin B-12: 224 pg/mL (ref 180–914)

## 2024-01-14 LAB — FOLATE: Folate: 18.3 ng/mL (ref >5.9)

## 2024-01-14 SURGERY — FIXATION, FRACTURE, INTERTROCHANTERIC, WITH INTRAMEDULLARY ROD
Anesthesia: General | Laterality: Right

## 2024-01-14 MED ORDER — PROPOFOL 10 MG/ML IV BOLUS
INTRAVENOUS | Status: AC
  Filled 2024-01-14: qty 20

## 2024-01-14 MED ORDER — ONDANSETRON HCL 4 MG/2ML IJ SOLN
4.0000 mg | Freq: Four times a day (QID) | INTRAMUSCULAR | Status: DC | PRN
  Administered 2024-01-14: 4 mg via INTRAVENOUS
  Filled 2024-01-14 (×2): qty 2

## 2024-01-14 MED ORDER — POLYETHYLENE GLYCOL 3350 17 G PO PACK: 17.0000 g | PACK | Freq: Every day | ORAL | Status: DC | PRN

## 2024-01-14 MED ORDER — ACETAMINOPHEN 500 MG PO TABS
1000.0000 mg | ORAL_TABLET | Freq: Four times a day (QID) | ORAL | Status: AC
  Administered 2024-01-15 (×3): 1000 mg via ORAL
  Filled 2024-01-14 (×4): qty 2

## 2024-01-14 MED ORDER — ALBUTEROL SULFATE (2.5 MG/3ML) 0.083% IN NEBU: 2.5000 mg | INHALATION_SOLUTION | Freq: Four times a day (QID) | RESPIRATORY_TRACT | Status: DC | PRN

## 2024-01-14 MED ORDER — 0.9 % SODIUM CHLORIDE (POUR BTL) OPTIME
TOPICAL | Status: DC | PRN
  Administered 2024-01-14: 1000 mL

## 2024-01-14 MED ORDER — ENOXAPARIN SODIUM 40 MG/0.4ML IJ SOSY: 40.0000 mg | PREFILLED_SYRINGE | INTRAMUSCULAR | Status: DC

## 2024-01-14 MED ORDER — DOCUSATE SODIUM 100 MG PO CAPS
100.0000 mg | ORAL_CAPSULE | Freq: Two times a day (BID) | ORAL | Status: DC
  Administered 2024-01-15 – 2024-01-16 (×3): 100 mg via ORAL
  Filled 2024-01-14 (×4): qty 1

## 2024-01-14 MED ORDER — LACTATED RINGERS IV SOLN: INTRAVENOUS | Status: DC

## 2024-01-14 MED ORDER — CEFAZOLIN SODIUM-DEXTROSE 2-4 GM/100ML-% IV SOLN
2.0000 g | Freq: Four times a day (QID) | INTRAVENOUS | Status: AC
  Administered 2024-01-14 – 2024-01-15 (×3): 2 g via INTRAVENOUS
  Filled 2024-01-14 (×3): qty 100

## 2024-01-14 MED ORDER — TRAMADOL HCL 50 MG PO TABS
50.0000 mg | ORAL_TABLET | Freq: Four times a day (QID) | ORAL | Status: DC | PRN
  Administered 2024-01-15: 50 mg via ORAL
  Filled 2024-01-14: qty 1

## 2024-01-14 MED ORDER — HYDROMORPHONE HCL 1 MG/ML IJ SOLN
0.2500 mg | INTRAMUSCULAR | Status: DC | PRN
  Administered 2024-01-14 (×2): 0.5 mg via INTRAVENOUS

## 2024-01-14 MED ORDER — ORAL CARE MOUTH RINSE: 15.0000 mL | Freq: Once | OROMUCOSAL | Status: AC

## 2024-01-14 MED ORDER — ACETAMINOPHEN 10 MG/ML IV SOLN
INTRAVENOUS | Status: AC
  Filled 2024-01-14: qty 100

## 2024-01-14 MED ORDER — DEXAMETHASONE SODIUM PHOSPHATE 10 MG/ML IJ SOLN
INTRAMUSCULAR | Status: DC | PRN
  Administered 2024-01-14: 8 mg via INTRAVENOUS

## 2024-01-14 MED ORDER — ROCURONIUM BROMIDE 10 MG/ML (PF) SYRINGE
PREFILLED_SYRINGE | INTRAVENOUS | Status: DC | PRN
  Administered 2024-01-14: 40 mg via INTRAVENOUS

## 2024-01-14 MED ORDER — FENTANYL CITRATE (PF) 250 MCG/5ML IJ SOLN
INTRAMUSCULAR | Status: DC | PRN
  Administered 2024-01-14 (×2): 50 ug via INTRAVENOUS

## 2024-01-14 MED ORDER — SUGAMMADEX SODIUM 200 MG/2ML IV SOLN
INTRAVENOUS | Status: DC | PRN
  Administered 2024-01-14 (×2): 100 mg via INTRAVENOUS

## 2024-01-14 MED ORDER — POTASSIUM CHLORIDE CRYS ER 20 MEQ PO TBCR
40.0000 meq | EXTENDED_RELEASE_TABLET | Freq: Two times a day (BID) | ORAL | Status: AC
  Administered 2024-01-14 (×2): 40 meq via ORAL
  Filled 2024-01-14 (×2): qty 2

## 2024-01-14 MED ORDER — ACETAMINOPHEN 10 MG/ML IV SOLN
INTRAVENOUS | Status: DC | PRN
Start: 2024-01-14 — End: 2024-01-14
  Administered 2024-01-14: 1000 mg via INTRAVENOUS

## 2024-01-14 MED ORDER — ONDANSETRON HCL 4 MG/2ML IJ SOLN
INTRAMUSCULAR | Status: DC | PRN
  Administered 2024-01-14: 4 mg via INTRAVENOUS

## 2024-01-14 MED ORDER — HYDROMORPHONE HCL 1 MG/ML IJ SOLN
INTRAMUSCULAR | Status: AC
  Filled 2024-01-14: qty 1

## 2024-01-14 MED ORDER — ONDANSETRON HCL 4 MG PO TABS: 4.0000 mg | ORAL_TABLET | Freq: Four times a day (QID) | ORAL | Status: DC | PRN

## 2024-01-14 MED ORDER — CEFAZOLIN SODIUM-DEXTROSE 2-4 GM/100ML-% IV SOLN
2.0000 g | INTRAVENOUS | Status: AC
  Administered 2024-01-14: 2 g via INTRAVENOUS
  Filled 2024-01-14: qty 100

## 2024-01-14 MED ORDER — CHLORHEXIDINE GLUCONATE 4 % EX SOLN
60.0000 mL | Freq: Once | CUTANEOUS | Status: AC
  Administered 2024-01-14: 4 via TOPICAL

## 2024-01-14 MED ORDER — PHENYLEPHRINE 80 MCG/ML (10ML) SYRINGE FOR IV PUSH (FOR BLOOD PRESSURE SUPPORT)
PREFILLED_SYRINGE | INTRAVENOUS | Status: DC | PRN
  Administered 2024-01-14: 80 ug via INTRAVENOUS
  Administered 2024-01-14: 160 ug via INTRAVENOUS

## 2024-01-14 MED ORDER — METHOCARBAMOL 1000 MG/10ML IJ SOLN
500.0000 mg | Freq: Four times a day (QID) | INTRAMUSCULAR | Status: DC | PRN
  Administered 2024-01-14 (×2): 500 mg via INTRAVENOUS
  Filled 2024-01-14 (×2): qty 10

## 2024-01-14 MED ORDER — VITAMIN B-12 1000 MCG PO TABS
1000.0000 ug | ORAL_TABLET | Freq: Every day | ORAL | Status: DC
  Administered 2024-01-15 – 2024-01-16 (×2): 1000 ug via ORAL
  Filled 2024-01-14 (×2): qty 1

## 2024-01-14 MED ORDER — CHLORHEXIDINE GLUCONATE 0.12 % MT SOLN
OROMUCOSAL | Status: AC
  Administered 2024-01-14: 15 mL via OROMUCOSAL
  Filled 2024-01-14: qty 15

## 2024-01-14 MED ORDER — PROPOFOL 10 MG/ML IV BOLUS
INTRAVENOUS | Status: DC | PRN
  Administered 2024-01-14: 70 mg via INTRAVENOUS

## 2024-01-14 MED ORDER — AMISULPRIDE (ANTIEMETIC) 5 MG/2ML IV SOLN: 10.0000 mg | Freq: Once | INTRAVENOUS | Status: DC | PRN

## 2024-01-14 MED ORDER — LIDOCAINE 2% (20 MG/ML) 5 ML SYRINGE
INTRAMUSCULAR | Status: DC | PRN
  Administered 2024-01-14: 40 mg via INTRAVENOUS

## 2024-01-14 MED ORDER — SODIUM CHLORIDE 0.9 % IV SOLN: 12.5000 mg | INTRAVENOUS | Status: DC | PRN

## 2024-01-14 MED ORDER — CYANOCOBALAMIN 1000 MCG/ML IJ SOLN
1000.0000 ug | Freq: Once | INTRAMUSCULAR | Status: AC
  Administered 2024-01-14: 1000 ug via SUBCUTANEOUS
  Filled 2024-01-14: qty 1

## 2024-01-14 MED ORDER — POVIDONE-IODINE 10 % EX SWAB
2.0000 | Freq: Once | CUTANEOUS | Status: AC
  Administered 2024-01-14: 2 via TOPICAL

## 2024-01-14 MED ORDER — FENTANYL CITRATE (PF) 250 MCG/5ML IJ SOLN
INTRAMUSCULAR | Status: AC
  Filled 2024-01-14: qty 5

## 2024-01-14 MED ORDER — CHLORHEXIDINE GLUCONATE 0.12 % MT SOLN: 15.0000 mL | Freq: Once | OROMUCOSAL | Status: AC

## 2024-01-14 SURGICAL SUPPLY — 34 items
ALCOHOL 70% 16 OZ (MISCELLANEOUS) ×1 IMPLANT
BAG COUNTER SPONGE SURGICOUNT (BAG) ×1 IMPLANT
BIT DRILL CALIBRATED 4.2 (BIT) IMPLANT
BIT DRILL CANN 16 HIP (BIT) IMPLANT
BIT DRILL CANN STP 6/9 HIP (BIT) IMPLANT
BIT DRILL TAPERED 10 (BIT) IMPLANT
BLADE SURG 10 STRL SS (BLADE) ×1 IMPLANT
BNDG COHESIVE 4X5 TAN STRL LF (GAUZE/BANDAGES/DRESSINGS) ×1 IMPLANT
BNDG COHESIVE 6X5 TAN ST LF (GAUZE/BANDAGES/DRESSINGS) ×1 IMPLANT
CHLORAPREP W/TINT 26 (MISCELLANEOUS) ×1 IMPLANT
COVER BACK TABLE 80X110 HD (DRAPES) ×1 IMPLANT
COVER PERINEAL POST (MISCELLANEOUS) ×1 IMPLANT
COVER SURGICAL LIGHT HANDLE (MISCELLANEOUS) ×1 IMPLANT
DRAPE STERI IOBAN 125X83 (DRAPES) ×1 IMPLANT
DRESSING MEPILEX FLEX 4X4 (GAUZE/BANDAGES/DRESSINGS) ×3 IMPLANT
DRSG MEPILEX POST OP 4X8 (GAUZE/BANDAGES/DRESSINGS) IMPLANT
GLOVE SURG SYN 8.0 PF PI (GLOVE) ×2 IMPLANT
GOWN STRL REUS W/TWL LRG LVL3 (GOWN DISPOSABLE) ×1 IMPLANT
GOWN STRL REUS W/TWL XL LVL3 (GOWN DISPOSABLE) ×1 IMPLANT
GUIDEWIRE 3.2X400 (WIRE) IMPLANT
KIT BASIN OR (CUSTOM PROCEDURE TRAY) ×1 IMPLANT
KIT TURNOVER KIT B (KITS) ×1 IMPLANT
NAIL CANN TFNA DEG 11X170-125 (Nail) IMPLANT
NS IRRIG 1000ML POUR BTL (IV SOLUTION) ×1 IMPLANT
PAD ARMBOARD POSITIONER FOAM (MISCELLANEOUS) ×3 IMPLANT
SCREW FENES TFNA 100 (Screw) IMPLANT
SCREW LOCK IM NL 5X38 (Screw) IMPLANT
SPONGE T-LAP 18X18 ~~LOC~~+RFID (SPONGE) ×1 IMPLANT
STAPLER SKIN PROX 35W (STAPLE) ×1 IMPLANT
SUT VIC AB 1 CTX36XBRD ANBCTRL (SUTURE) ×1 IMPLANT
SUT VIC AB 2-0 SH 27X BRD (SUTURE) ×1 IMPLANT
TOWEL GREEN STERILE (TOWEL DISPOSABLE) ×1 IMPLANT
TRAY FOL W/BAG SLVR 16FR STRL (SET/KITS/TRAYS/PACK) IMPLANT
WATER STERILE IRR 1000ML POUR (IV SOLUTION) ×1 IMPLANT

## 2024-01-14 NOTE — Discharge Instructions (Signed)
 Orthopaedic Discharge Instructions   General Discharge Instructions  WEIGHT BEARING STATUS: Weightbearing as tolerated  RANGE OF MOTION/ACTIVITY: Ad lib.  Wound Care: You may remove your surgical dressing on Friday, (date 01/16/2024). Incisions can be left open to air if there is no drainage. Once the incision is completely dry and without drainage, it may be left open to air out.  Showering may begin Friday, (date 01/16/2024).  Clean incision gently with soap and water.  No baths or pools  DVT/PE prophylaxis: Xarelto   Diet: as you were eating previously.  Can use over the counter stool softeners and bowel preparations, such as Miralax , to help with bowel movements.  Narcotics can be constipating.  Be sure to drink plenty of fluids  PAIN MEDICATION USE AND EXPECTATIONS  You have likely been given narcotic medications to help control your pain.  After a traumatic event that results in an fracture (broken bone) with or without surgery, it is ok to use narcotic pain medications to help control one's pain.  We understand that everyone responds to pain differently and each individual patient will be evaluated on a regular basis for the continued need for narcotic medications. Ideally, narcotic medication use should last no more than 6-8 weeks (coinciding with fracture healing).   As a patient it is your responsibility as well to monitor narcotic medication use and report the amount and frequency you use these medications when you come to your office visit.   We would also advise that if you are using narcotic medications, you should take a dose prior to therapy to maximize you participation.  IF YOU ARE ON NARCOTIC MEDICATIONS IT IS NOT PERMISSIBLE TO OPERATE A MOTOR VEHICLE (MOTORCYCLE/CAR/TRUCK/MOPED) OR HEAVY MACHINERY DO NOT MIX NARCOTICS WITH OTHER CNS (CENTRAL NERVOUS SYSTEM) DEPRESSANTS SUCH AS ALCOHOL  POST-OPERATIVE OPIOID TAPER INSTRUCTIONS: It is important to wean off of your opioid  medication as soon as possible. If you do not need pain medication after your surgery it is ok to stop day one. Opioids include: Codeine, Hydrocodone (Norco, Vicodin), Oxycodone(Percocet, oxycontin) and hydromorphone  amongst others.  Long term and even short term use of opiods can cause: Increased pain response Dependence Constipation Depression Respiratory depression And more.  Withdrawal symptoms can include Flu like symptoms Nausea, vomiting And more Techniques to manage these symptoms Hydrate well Eat regular healthy meals Stay active Use relaxation techniques(deep breathing, meditating, yoga) Do Not substitute Alcohol to help with tapering If you have been on opioids for less than two weeks and do not have pain than it is ok to stop all together.  Plan to wean off of opioids This plan should start within one week post op of your fracture surgery  Maintain the same interval or time between taking each dose and first decrease the dose.  Cut the total daily intake of opioids by one tablet each day Next start to increase the time between doses. The last dose that should be eliminated is the evening dose.    STOP SMOKING OR USING NICOTINE PRODUCTS!!!!  As discussed nicotine severely impairs your body's ability to heal surgical and traumatic wounds but also impairs bone healing.  Wounds and bone heal by forming microscopic blood vessels (angiogenesis) and nicotine is a vasoconstrictor (essentially, shrinks blood vessels).  Therefore, if vasoconstriction occurs to these microscopic blood vessels they essentially disappear and are unable to deliver necessary nutrients to the healing tissue.  This is one modifiable factor that you can do to dramatically increase your chances of healing your  injury.  (This means no smoking, no nicotine gum, patches, etc)  DO NOT USE NONSTEROIDAL ANTI-INFLAMMATORY DRUGS (NSAID'S)  Using products such as Advil (ibuprofen), Aleve (naproxen), Motrin  (ibuprofen) for additional pain control during fracture healing can delay and/or prevent the healing response.  If you would like to take over the counter (OTC) medication, Tylenol  (acetaminophen ) is ok.  However, some narcotic medications that are given for pain control contain acetaminophen  as well. Therefore, you should not exceed more than 4000 mg of tylenol  in a day if you do not have liver disease.  Also note that there are may OTC medicines, such as cold medicines and allergy  medicines that my contain tylenol  as well.  If you have any questions about medications and/or interactions please ask your doctor/PA or your pharmacist.      ICE AND ELEVATE INJURED/OPERATIVE EXTREMITY  Using ice and elevating the injured extremity above your heart can help with swelling and pain control.  Icing in a pulsatile fashion, such as 20 minutes on and 20 minutes off, can be followed.    Do not place ice directly on skin. Make sure there is a barrier between to skin and the ice pack.    Using frozen items such as frozen peas works well as the conform nicely to the are that needs to be iced.  USE AN ACE WRAP OR TED HOSE FOR SWELLING CONTROL  In addition to icing and elevation, Ace wraps or TED hose are used to help limit and resolve swelling.  It is recommended to use Ace wraps or TED hose until you are informed to stop.    When using Ace Wraps start the wrapping distally (farthest away from the body) and wrap proximally (closer to the body)   Example: If you had surgery on your leg or thing and you do not have a splint on, start the ace wrap at the toes and work your way up to the thigh        If you had surgery on your upper extremity and do not have a splint on, start the ace wrap at your fingers and work your way up to the upper arm  IF YOU ARE IN A SPLINT OR CAST DO NOT REMOVE IT FOR ANY REASON   If your splint gets wet for any reason please contact the office immediately. You may shower in your splint or  cast as long as you keep it dry.  This can be done by wrapping in a cast cover or garbage back (or similar)  Do Not stick any thing down your splint or cast such as pencils, money, or hangers to try and scratch yourself with.  If you feel itchy take benadryl as prescribed on the bottle for itching  IF YOU ARE IN A CAM BOOT (BLACK BOOT)  You may remove boot periodically. Perform daily dressing changes as noted below.  Wash the liner of the boot regularly and wear a sock when wearing the boot. It is recommended that you sleep in the boot until told otherwise   CALL THE OFFICE FOR MEDICATION REFILLS OR WITH ANY QUESTIONS/CONCERNS: [phone]       Discharge Pin Site Instructions  Dress pins daily with Kerlix roll starting on POD 2. Wrap the Kerlix so that it tamps the skin down around the pin-skin interface to prevent/limit motion of the skin relative to the pin.  (Pin-skin motion is the primary cause of pain and infection related to external fixator pin sites).  Remove any crust or coagulum that may obstruct drainage with a saline moistened gauze or soap and water.  After POD 3, if there is no discernable drainage on the pin site dressing, the interval for change can by increased to every other day.  You may shower with the fixator, cleaning all pin sites gently with soap and water.  If you have a surgical wound this needs to be completely dry and without drainage before showering.  The extremity can be lifted by the fixator to facilitate wound care and transfers.  Notify the office/Doctor if you experience increasing drainage, redness, or pain from a pin site, or if you notice purulent (thick, snot-like) drainage.   Discharge Wound Care Instructions  Do NOT apply any ointments, solutions or lotions to pin sites or surgical wounds.  These prevent needed drainage and even though solutions like hydrogen peroxide kill bacteria, they also damage cells lining the pin sites that help fight  infection.  Applying lotions or ointments can keep the wounds moist and can cause them to breakdown and open up as well. This can increase the risk for infection. When in doubt call the office.  Surgical incisions should be dressed daily.  If any drainage is noted, use one layer of adaptic or Mepitel, then gauze, Kerlix, and an ace wrap. - These dressing supplies should be available at local medical supply stores ([facility], [facility], etc) as well as Insurance claims handler ([facility], [facility], [place], etc)  Once the incision is completely dry and without drainage, it may be left open to air out.  Showering may begin 36-48 hours later.  Cleaning gently with soap and water.  Traumatic wounds should be dressed daily as well.    One layer of adaptic, gauze, Kerlix, then ace wrap.  The adaptic can be discontinued once the draining has ceased    If you have a wet to dry dressing: wet the gauze with saline the squeeze as much saline out so the gauze is moist (not soaking wet), place moistened gauze over wound, then place a dry gauze over the moist one, followed by Kerlix wrap, then ace wrap.    Call office for the following: Temperature greater than 101F Persistent nausea and vomiting Severe uncontrolled pain Redness, tenderness, or signs of infection (pain, swelling, redness, odor or green/yellow discharge around the site) Difficulty breathing, headache or visual disturbances Hives Persistent dizziness or light-headedness Extreme fatigue Any other questions or concerns you may have after discharge  In an emergency, call 911 or go to an Emergency Department at a nearby hospital  OTHER HELPFUL INFORMATION  If you had a block, it will wear off between 8-24 hrs postop typically.  This is period when your pain may go from nearly zero to the pain you would have had postop without the block.  This is an abrupt transition but nothing dangerous is happening.  You may take an extra dose of narcotic  when this happens.  You should wean off your narcotic medicines as soon as you are able.  Most patients will be off or using minimal narcotics before their first postop appointment.   We suggest you use the pain medication the first night prior to going to bed, in order to ease any pain when the anesthesia wears off. You should avoid taking pain medications on an empty stomach as it will make you nauseous.  Do not drink alcoholic beverages or take illicit drugs when taking pain medications.  In most states it is  against the law to drive while you are in a splint or sling.  And certainly against the law to drive while taking narcotics.  You may return to work/school in the next couple of days when you feel up to it.   Pain medication may make you constipated.  Below are a few solutions to try in this order: Decrease the amount of pain medication if you aren't having pain. Drink lots of decaffeinated fluids. Drink prune juice and/or each dried prunes  If the first 3 don't work start with additional solutions Take Colace - an over-the-counter stool softener Take Senokot - an over-the-counter laxative Take Miralax  - a stronger over-the-counter laxative   Follow up with Dr Reyne in 2 weeks  Cordella Reyne, MD, MS [facility] [phone]

## 2024-01-14 NOTE — ED Notes (Signed)
 Transport called to take pt upstairs, all belongings transported with pt

## 2024-01-14 NOTE — Plan of Care (Signed)
  Problem: Education: Goal: Knowledge of General Education information will improve Description: Including pain rating scale, medication(s)/side effects and non-pharmacologic comfort measures Outcome: Progressing   Problem: Pain Managment: Goal: General experience of comfort will improve and/or be controlled Outcome: Progressing

## 2024-01-14 NOTE — Progress Notes (Signed)
 Pt came back to rm 13 from PACU. Initiated tele. Obtained VS. Call bell within reach.   Amado GORMAN Arabia, RN

## 2024-01-14 NOTE — ED Notes (Signed)
 CCMD called and monitored

## 2024-01-14 NOTE — ED Notes (Signed)
 Hourly rounding complete. Pt alert or resting, no distress noted, offered toileting and diet as appropriate. Side rails up, call light within reach. Pt denies pain or further needs at this time.

## 2024-01-14 NOTE — ED Notes (Addendum)
 Ortho provider at bedside, ETA for surgery this afternoon. Per provider okay to have morning meds

## 2024-01-14 NOTE — Transfer of Care (Signed)
 Immediate Anesthesia Transfer of Care Note  Patient: [name]  Procedure(s) Performed: FIXATION, FRACTURE, INTERTROCHANTERIC, WITH INTRAMEDULLARY ROD (Right)  Patient Location: PACU  Anesthesia Type:General  Level of Consciousness: awake and alert   Airway & Oxygen  Therapy: Patient Spontanous Breathing and Patient connected to face mask oxygen   Post-op Assessment: Report given to RN and Post -op Vital signs reviewed and stable  Post vital signs: Reviewed and stable  Last Vitals:  Vitals Value Taken Time  BP 123/66 01/14/24 16:30  Temp 37.1 C 01/14/24 16:30  Pulse 78 01/14/24 16:33  Resp 13 01/14/24 16:33  SpO2 91 % 01/14/24 16:33  Vitals shown include unfiled device data.  Last Pain:  Vitals:   01/14/24 1355  TempSrc:   PainSc: 7          Complications: No notable events documented.

## 2024-01-14 NOTE — Consult Note (Signed)
 Orthopedic Consult  Patient ID: [name] MRN: [id number] DOB/AGE: 08/23/1935 88 y.o.  Reason for Consult: Right hip fracture Referring Physician: Donnamarie  HPI: [name] is an 88 y.o. female who sustained mechanical trip and fall yesterday.  She was moving on her [place] when she tripped landing on her right side.  She does have some baseline balance issues.  She usually walks with a walker when going any distance but does transfer and get around short distances independently.  She denies any antecedent right hip pain.  Denies any other injuries or fall.  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 atenolol  stopped due to this, HR 40s.   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  CAD s/p RCA stent 2000 with residual mild-mod disease of left system 2001.   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   Osteoporosis 03/24/2015   Peripheral neuropathy 03/24/2015   Plantar fasciitis    Sleep apnea     Past Surgical History:  Procedure Laterality Date   BREAST LUMPECTOMY W/ NEEDLE LOCALIZATION  01/29/2011   Left with [facility] Dr Merrilyn   CHOLECYSTECTOMY  1955   CORONARY ANGIOPLASTY WITH STENT PLACEMENT  2000   DILATION AND CURETTAGE OF UTERUS  1976   and 1996   HAND SURGERY  1992   tendons between thumb and forefinger   SKIN BIOPSY     1994, 2006, 2008, 2010, 2011, 2011, 2012-  pre-cancerous   TONSILLECTOMY  1955    Family History  Problem Relation Age of Onset   Heart disease  Father    Heart attack Father    Rectal cancer Father    Asthma Sister    Heart disease Brother    Heart attack Brother    Stomach cancer Neg Hx    Colon cancer Neg Hx     Social History:  reports that she quit smoking about 61 years ago. Her smoking use included cigarettes. She started smoking about 65 years ago. She has a 4 pack-year smoking history. She has never used smokeless tobacco. She reports that she does not drink alcohol and does not use drugs.  Allergies:  Allergies  Allergen Reactions   Crestor  [Rosuvastatin  Calcium ]     Severe liver function problems   Hydrocodone -Acetaminophen  Anxiety and Itching   Lidocaine  Hcl Other (See Comments)    Novacaine:  Becomes very shaky   Lipitor [Atorvastatin  Calcium ] Other (See Comments)    Elevated liver function    Procaine Other (See Comments)    shaking   Rosuvastatin  Other (See Comments)    Severe liver function problems   Theophylline Other (See Comments)    Becomes very shaky skaking skaking   Vicodin [Hydrocodone -Acetaminophen ] Itching    Itching all over   Codeine Nausea Only, Anxiety and Other (See Comments)    Also complained of dizziness.  Has same problems with oxycodone and hydrocodone  Visual Disturbance, Balance Difficulty, Dizzy Room Spinning; Swimming Balance, vision, nausea, dizzy, swimming, room spinning Balance, vision, nausea, dizzy, swimming, room spinning   Ephedrine Other (See Comments)    shaking   Other Other (See Comments)    Quinine causes nausea, dizzy   Oxycodone Other (See Comments)    Balance, vision, nausea, dizzy, swimming, room spinning   Oxycodone-Acetaminophen  Nausea Only, Anxiety and Other (See Comments)    Visual Disturbance, Balance Difficulty, Dizzy Room Spinning; Swimming   Propoxyphene Anxiety, Nausea Only and Other (See Comments)    Visual Disturbance, Balance Difficulty, Dizzy Room Spinning; Swimming  Balance, vision, nausea, dizzy, swimming, room  spinning  Balance, vision, nausea, dizzy, swimming, room spinning  Other Reaction(s): climb walls   Quinine Derivatives Nausea Only    dizzy   Sulfa  Antibiotics Nausea Only    Also complained of dizziness   Sulfur  Other (See Comments)   Epinephrine  Other (See Comments)    Patient becomes shaky shaking shaking   Penicillins Rash    Pt reported all over body rash in 1958 Has patient had a PCN reaction causing immediate rash, facial/tongue/throat swelling, SOB or lightheadedness with hypotension:Yes Has patient had a PCN reaction causing severe rash involving mucus membranes or skin necrosis: No Has patient had a PCN reaction that required hospitalization: No Has patient had a PCN reaction occurring within the last 10 years:No     Quinine Other (See Comments)   Theophyllines Other (See Comments)    Patient becomes shaky    Medications: I have reviewed the patient's current medications.  ROS: Constitutional: No fever or chills Vision: No changes in vision ENT: No difficulty swallowing CV: No chest pain Pulm: No SOB or wheezing GI: No nausea or vomiting GU: No urgency or inability to hold urine Skin: No poor wound healing Neurologic: No numbness or tingling Psychiatric: No depression or anxiety Heme: No bruising Allergic: No reaction to medications or food   Exam: Blood pressure 113/63, pulse 80, temperature 98 F (36.7 C), temperature source Oral, resp. rate 18, height 5' 5 (1.651 m), weight 96.2 kg, SpO2 91%. General: Well-appearing woman in no acute distress Orientation: Alert and oriented Mood and Affect: Mood is calm  Injured Extremity (CV, lymph, sensation, reflexes): Right leg is shortened Rotated.  She has intact sensation in the saphenous sural tibial and peroneal nerve distributions.  5 out of 5 strength EHL FHL gastrocsoleus anterior  There is no tenderness of patient crepitus defects deformities about the bilateral upper extremities or left lower  leg.  Multiple bruises are noted which she attributes to her    Medical Decision Making: Data: Imaging: CT scan shows a right trochanteric hip fracture  Labs: White cell count of 8.2.  Hemoglobin 11.2.  Hematocrit 34.2.  Platelets 100, INR 1.4   Medical history and chart was reviewed and case discussed with medical provider.  Assessment/Plan: Right intra trochanteric hip fracture  The patient does have a right intertrochanteric hip fracture.  This require surgical stabilization.  This will be with an intramedullary nail.  We discussed the risks of surgery which include bleeding, infection, injuring tendons or nerves, nonunion, malunion, hardware failure, risk of anesthesia, risk of blood clots.  We discussed that the goals of surgery are provide for early mobilization and ambulation and prevent the morbidity associated with prolonged immobilization.  We will plan for surgery be done later on today.  Informed consent was obtained.  The plan was also discussed with  the patient's 2 daughters were in attendance.  New problem w/ workup planned: High complexity diagnosis (Level 5) Surgery w/ risks or Emergency surgery: High complexity Risk (Level 5)  All others are Level 4 with comprehensive musculoskeletal exam.  Cordella Rhein, MD, MS [facility] [phone]

## 2024-01-14 NOTE — Progress Notes (Signed)
 PROGRESS NOTE  [name] FMW:[id number] DOB: 04-Nov-1935 DOA: 01/13/2024 PCP: Norleen Lynwood ORN, MD  HPI/Recap of past 24 hours: [name] is a 88 y.o. female with history of CAD status post PCI, atrial fibrillation, hyperlipidemia, sleep apnea, gout, hypothyroidism, macrocytic anemia was brought to the ER after patient had a fall.  Patient states she was picking the mail when she slipped and fell.  Did not hit her head.  Did not lose consciousness. In the ER CT scan showed a right hip fracture.  Orthopedic on-call was consulted.  Labs show hemoglobin of 11.5 macrocytic picture.  Creatinine 0.9.  Platelets 103.  Patient admitted for right hip fracture.    Today, saw patient after right hip surgery, reports some nausea and mild confusion earlier, otherwise stable    Assessment/Plan: Principal Problem:   Hip fracture (HCC) Active Problems:   Malignant neoplasm of upper-outer quadrant of left breast in female, estrogen receptor positive (HCC)   Hypothyroidism   Gout   Essential hypertension   Atrial fibrillation, chronic (HCC)   Mixed hyperlipidemia   OSA (obstructive sleep apnea)   Right hip fracture Due to mechanical fall Orthopedics consulted, s/p intramedullary rod on 01/14/2024 Other management including pain management, DVT PPx as per orthopedics  Hypokalemia Replace as needed  CAD s/p PCI Denies any chest pain Continue beta-blockers, statins, Zetia  Telemetry  Paroxysmal A-fib Currently rate controlled Continue beta-blockers, Xarelto  currently on hold, resume as per orthopedics Telemetry  Hypertension Continue Toprol , HCTZ on hold for now  Macrocytic anemia Vitamin B12 deficiency Vitamin B12 supplementation  Hypothyroidism Continue Synthroid   Chronic pain syndrome Continue gabapentin   Gout Continue allopurinol   History of asthma Takes as needed Symbicort   OSA CPAP at bedtime  History of anxiety Continue Lexapro   Obesity class II Lifestyle  modification advised Reports significant weight loss after her husband passed 2 years ago     Estimated body mass index is 35.28 kg/m as calculated from the following:   Height as of this encounter: 5' 5 (1.651 m).   Weight as of this encounter: 96.2 kg.     Code Status: Full  Family Communication: Daughter at bedside  Disposition Plan: Status is: Inpatient Remains inpatient appropriate because: Level of care      Consultants: Orthopedics  Procedures: Surgical as above  Antimicrobials: None  DVT prophylaxis: Xarelto  as per orthopedic surgeon   Objective: Vitals:   01/14/24 1000 01/14/24 1048 01/14/24 1129 01/14/24 1355  BP: 120/71  (!) 161/84 129/68  Pulse: 90  84 85  Resp: (!) 21  19 20   Temp:  98.9 F (37.2 C) 98.8 F (37.1 C) 98.5 F (36.9 C)  TempSrc:  Oral Oral   SpO2: 96%  96% 94%  Weight:      Height:        Intake/Output Summary (Last 24 hours) at 01/14/2024 1559 Last data filed at 01/14/2024 1540 Gross per 24 hour  Intake 100 ml  Output --  Net 100 ml   Filed Weights   01/13/24 2050  Weight: 96.2 kg    Exam: General: NAD  Cardiovascular: S1, S2 present Respiratory: CTAB Abdomen: Soft, nontender, nondistended, bowel sounds present Musculoskeletal: No bilateral pedal edema noted, right hip dressing C/D/I Skin: Normal Psychiatry: Normal mood     Data Reviewed: CBC: Recent Labs  Lab 01/13/24 2012 01/14/24 0546  WBC 8.2 8.2  NEUTROABS 6.5  --   HGB 11.5* 11.2*  HCT 35.8* 34.2*  MCV 102.9* 100.3*  PLT 103* 100*  Basic Metabolic Panel: Recent Labs  Lab 01/13/24 2012 01/14/24 0546  NA 141 140  K 3.7 3.3*  CL 109 106  CO2 20* 26  GLUCOSE 101* 125*  BUN 22 21  CREATININE 0.95 1.02*  CALCIUM  8.4* 8.8*  MG  --  1.8   GFR: Estimated Creatinine Clearance: 45.4 mL/min (A) (by C-G formula based on SCr of 1.02 mg/dL (H)). Liver Function Tests: Recent Labs  Lab 01/13/24 2012  AST 24  ALT 10  ALKPHOS 56  BILITOT 1.1   PROT 5.1*  ALBUMIN 3.1*   No results for input(s): LIPASE, AMYLASE in the last 168 hours. No results for input(s): AMMONIA in the last 168 hours. Coagulation Profile: Recent Labs  Lab 01/13/24 2012  INR 1.4*   Cardiac Enzymes: No results for input(s): CKTOTAL, CKMB, CKMBINDEX, TROPONINI in the last 168 hours. BNP (last 3 results) No results for input(s): PROBNP in the last 8760 hours. HbA1C: No results for input(s): HGBA1C in the last 72 hours. CBG: No results for input(s): GLUCAP in the last 168 hours. Lipid Profile: No results for input(s): CHOL, HDL, LDLCALC, TRIG, CHOLHDL, LDLDIRECT in the last 72 hours. Thyroid  Function Tests: No results for input(s): TSH, T4TOTAL, FREET4, T3FREE, THYROIDAB in the last 72 hours. Anemia Panel: Recent Labs    01/14/24 0546 01/14/24 1155  VITAMINB12  --  224  FOLATE 18.3  --    Urine analysis:    Component Value Date/Time   COLORURINE YELLOW 09/09/2023 1558   APPEARANCEUR Cloudy (A) 09/09/2023 1558   LABSPEC 1.025 09/09/2023 1558   PHURINE 6.0 09/09/2023 1558   GLUCOSEU NEGATIVE 09/09/2023 1558   HGBUR NEGATIVE 09/09/2023 1558   BILIRUBINUR NEGATIVE 09/09/2023 1558   BILIRUBINUR negative 04/26/2016 1526   KETONESUR NEGATIVE 09/09/2023 1558   PROTEINUR NEGATIVE 06/12/2021 1458   UROBILINOGEN 1.0 09/09/2023 1558   NITRITE POSITIVE (A) 09/09/2023 1558   LEUKOCYTESUR TRACE (A) 09/09/2023 1558   Sepsis Labs: @LABRCNTIP (procalcitonin:4,lacticidven:4)  ) Recent Results (from the past 240 hours)  MRSA Next Gen by PCR, Nasal     Status: None   Collection Time: 01/14/24 12:00 PM   Specimen: Nasal Mucosa; Nasal Swab  Result Value Ref Range Status   MRSA by PCR Next Gen NOT DETECTED NOT DETECTED Final    Comment: (NOTE) The GeneXpert MRSA Assay (FDA approved for NASAL specimens only), is one component of a comprehensive MRSA colonization surveillance program. It is not intended to diagnose  MRSA infection nor to guide or monitor treatment for MRSA infections. Test performance is not FDA approved in patients less than 55 years old. Performed at [facility], [address], [place], [place] [address]       Studies: CT HEAD WO CONTRAST ( ) Result Date: 01/13/2024 CLINICAL DATA:  Clemens, head trauma, anticoagulated EXAM: CT HEAD WITHOUT CONTRAST TECHNIQUE: Contiguous axial images were obtained from the base of the skull through the vertex without intravenous contrast. RADIATION DOSE REDUCTION: This exam was performed according to the departmental dose-optimization program which includes automated exposure control, adjustment of the mA and/or kV according to patient size and/or use of iterative reconstruction technique. COMPARISON:  10/15/2022, 01/17/2023 FINDINGS: Brain: Hypodensities in the periventricular white matter are again noted, consistent with chronic small vessel ischemic changes. No evidence of acute infarct or hemorrhage. Lateral ventricles and midline structures are unremarkable. No acute extra-axial fluid collections. No mass effect. Vascular: No hyperdense vessel or unexpected calcification. Skull: Normal. Negative for fracture or focal lesion. Sinuses/Orbits: No acute  finding. 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 IMPRESSION: 1. Stable head CT, no acute intracranial process. Electronically Signed   By: Ozell Daring M.D.   On: 01/13/2024 21:38   CT Cervical Spine Wo Contrast Result Date: 01/13/2024 EXAM: CT CERVICAL SPINE WITHOUT CONTRAST 01/13/2024 09:31:00 PM TECHNIQUE: CT of the cervical spine was performed without the administration of intravenous contrast. Multiplanar reformatted images are provided for review. Automated exposure control, iterative reconstruction, and/or weight based adjustment of the mA/kV was utilized to reduce the radiation dose to as low as reasonably achievable. COMPARISON: CT cervical spine  10/15/2022 and MRI cervical spine 01/17/2023. CLINICAL HISTORY: Neck trauma (Age >= 65y). Fall; Head trauma, minor, normal mental status (Age 39-64y); Hip trauma, fracture suspected, no prior imaging; Mechanical fall while trying to check mail. She fell to her right side on her hip. Did not hit her head, no LOC. Pt does take Xerelto. She does have shortening and rotation of the right leg. FINDINGS: CERVICAL SPINE: BONES AND ALIGNMENT: Chronic fracture through the base of the dens, not substantially changed compared to MRI 01/17/2023. No acute fracture. No evidence of traumatic malalignment. DEGENERATIVE CHANGES: Multilevel degenerative spondylosis and facet arthropathy. Disc space height loss is greatest at C6-C7 where there is ankylosis of the vertebral bodies. No severe spinal canal narrowing. SOFT TISSUES: No prevertebral soft tissue swelling. IMPRESSION: 1. No acute fracture or traumatic malalignment. 2. Chronic fracture through the dens, not substantially changed compared to MRI 01/17/2023. Electronically signed by: [name] Gatlin MD 01/13/2024 09:38 PM EDT RP Workstation: [id number]   CT Hip Right Wo Contrast Result Date: 01/13/2024 CLINICAL DATA:  Trauma to the right hip. EXAM: CT OF THE RIGHT HIP WITHOUT CONTRAST TECHNIQUE: Multidetector CT imaging of the right hip was performed according to the standard protocol. Multiplanar CT image reconstructions were also generated. RADIATION DOSE REDUCTION: This exam was performed according to the departmental dose-optimization program which includes automated exposure control, adjustment of the mA and/or kV according to patient size and/or use of iterative reconstruction technique. COMPARISON:  None Available. FINDINGS: Bones/Joint/Cartilage There is a comminuted, mildly impacted, intertrochanteric fracture of the right femur with mild valgus angulation. No other acute fracture. The bones are osteopenic. No dislocation. No joint effusion. Ligaments Suboptimally  assessed by CT. Muscles and Tendons No intramuscular fluid collection or hematoma. Soft tissues No acute findings. IMPRESSION: Comminuted, intertrochanteric fracture of the right femur. Electronically Signed   By: Vanetta Chou M.D.   On: 01/13/2024 21:38   DG Chest Port 1 View Result Date: 01/13/2024 EXAM: 1 VIEW XRAY OF THE CHEST 01/13/2024 08:51:00 PM COMPARISON: 05/06/2023 CLINICAL HISTORY: Pre-op chest exam FINDINGS: LUNGS AND PLEURA: Pulmonary vascular congestion. No focal pulmonary opacity. No pulmonary edema. No pleural effusion. No pneumothorax. HEART AND MEDIASTINUM: Stable cardiomegaly. Aortic atherosclerotic calcification. No acute abnormality of the cardiac and mediastinal silhouettes. BONES AND SOFT TISSUES: No acute osseous abnormality. IMPRESSION: 1. Stable cardiomegaly. Pulmonary vascular congestion. Electronically signed by: [name] Gatlin MD 01/13/2024 08:58 PM EDT RP Workstation: [id number]    Scheduled Meds:  [MAR Hold] allopurinol   300 mg Oral Daily   [MAR Hold] gabapentin   300 mg Oral QHS   [MAR Hold] levothyroxine   75 mcg Oral Q0600   [MAR Hold] metoprolol  succinate  12.5 mg Oral Daily   [MAR Hold] potassium chloride   40 mEq Oral BID    Continuous Infusions:   ceFAZolin  (ANCEF ) IV     lactated ringers  10 mL/hr at 01/14/24 1522  LOS: 1 day     Lebron JINNY Cage, MD [facility]  If 7PM-7AM, please contact night-coverage [email or web address] 01/14/2024, 3:59 PM

## 2024-01-14 NOTE — Anesthesia Procedure Notes (Signed)
 Procedure Name: Intubation Date/Time: 01/14/2024 3:37 PM  Performed by: Boyce Shilling, CRNAPre-anesthesia Checklist: Patient identified, Emergency Drugs available, Suction available, Timeout performed and Patient being monitored Patient Re-evaluated:Patient Re-evaluated prior to induction Oxygen  Delivery Method: Circle system utilized Preoxygenation: Pre-oxygenation with 100% oxygen  Induction Type: IV induction and Cricoid Pressure applied Ventilation: Mask ventilation without difficulty Laryngoscope Size: Mac and 3 Grade View: Grade III Tube type: Oral Tube size: 7.0 mm Number of attempts: 1 Airway Equipment and Method: Stylet Placement Confirmation: ETT inserted through vocal cords under direct vision, positive ETCO2, CO2 detector and breath sounds checked- equal and bilateral Secured at: 22 cm Tube secured with: Tape Dental Injury: Teeth and Oropharynx as per pre-operative assessment  Difficulty Due To: Difficult Airway- due to anterior larynx Future Recommendations: Recommend- induction with short-acting agent, and alternative techniques readily available

## 2024-01-14 NOTE — ED Notes (Signed)
 Pt verbalizes understanding of pain medication risks and benefits. Pt agrees with fall precuations

## 2024-01-14 NOTE — Op Note (Signed)
 DATE OF SURGERY:  01/14/2024  TIME: 1:34 PM  PATIENT NAME:  [name]  AGE: 88 y.o.  PRE-OPERATIVE DIAGNOSIS:  R Fracture  POST-OPERATIVE DIAGNOSIS:  SAME  PROCEDURE:  FIXATION, FRACTURE, INTERTROCHANTERIC, WITH INTRAMEDULLARY ROD  SURGEON:  Cordella SHAUNNA Rhein  ASSISTANT:  none  OPERATIVE IMPLANTS:  * No implants in log *  UNIQUE ASPECTS OF THE CASE:  none  ESTIMATED BLOOD LOSS: 100  PREOPERATIVE INDICATIONS:  [name] is a 88 y.o. year old who fell and suffered an R Fracture. She was brought into the ER and then admitted and optimized and then elected for surgical intervention.    The risks benefits and alternatives were discussed with the patient including but not limited to the risks of nonoperative treatment, versus surgical intervention including infection, bleeding, nerve injury, malunion, nonunion, hardware prominence, hardware failure, need for hardware removal, blood clots, cardiopulmonary complications, morbidity, mortality, among others, and they were willing to proceed.    OPERATIVE PROCEDURE:  The patient was brought to the operating room and placed in the supine position. Anesthesia was administered. She was placed on the fracture table.  Closed reduction was performed under C-arm guidance.  Time out was then performed after sterile prep and drape. She received preoperative antibiotics.  Small incision proximal to the greater trochanter was made and carried down through skin and subcutaneous tissue.  Threaded guidewire was directed at the tip of the greater trochanter and advanced into the proximal metaphysis.  Positioning was confirmed with fluoroscopy.  I then used an entry reamer to enter the medullary canal.  I then passed a 11mm  shortSytheses TFN short nail down the center of the canal attached to the targeting arm.  I then used the targeting arm to make a percutaneous incision and directed a threaded guidewire up into the head/neck segment.  I confirmed  adequate tip apex distance and measured the length.  I decided to place a 100 mm screw.  I then drilled the path for the compression screw and placed an antirotation bar.    The proximal portion of the nail was statically locked.   I used the targeting arm to place a distal interlocking screw.  We drilled bicortically and place a 38mm screw.  The targeting arm was removed.  Final fluoroscopic imaging was obtained.    The wounds were irrigated copiously, and vancomycin  powder was placed in the wounds.  The gluteal fascia was closed with #1 Vicryl, and skin was closed with 2-0 Vicryl and Staples.  Sterile dressing was applied with Dermabond, 4 x 4 gauze, and Tegaderm.  The patient was awakened and returned to PACU in stable and satisfactory condition. There were no complications and the patient tolerated the procedure well.   Post op recs: WB: WBAT RLE Abx: ancef  x23 hours post op Dressing: keep intact until follow up, change PRN if soiled or saturated. DVT prophylaxis: Xareloto starting POD1 x4 weeks Follow up: 2 weeks after surgery for a wound check with Dr. Rhein at [facility].  Address: [address], [place], [place] [address]  Office Phone: [phone]  Cathlyn Rhein, MD Orthopaedic Surgery

## 2024-01-14 NOTE — Anesthesia Preprocedure Evaluation (Addendum)
 Anesthesia Evaluation  Patient identified by MRN, date of birth, ID band Patient awake    Reviewed: Allergy  & Precautions, NPO status , Patient's Chart, lab work & pertinent test results  Airway Mallampati: I  TM Distance: >3 FB Neck ROM: Full    Dental  (+) Teeth Intact, Chipped,    Pulmonary asthma , sleep apnea , COPD,  COPD inhaler, former smoker   breath sounds clear to auscultation       Cardiovascular hypertension, Pt. on medications and Pt. on home beta blockers + CAD, + Cardiac Stents and +CHF   Rhythm:Regular Rate:Normal  Echo:   1. Left ventricular ejection fraction, by estimation, is 60 to 65%. The  left ventricle has normal function. The left ventricle has no regional  wall motion abnormalities. There is mild concentric left ventricular  hypertrophy. Left ventricular diastolic  parameters are indeterminate.   2. Right ventricular systolic function is normal. The right ventricular  size is normal. There is moderately elevated pulmonary artery systolic  pressure.   3. Left atrial size was moderately dilated.   4. Right atrial size was mildly dilated.   5. No evidence of mitral valve regurgitation.   6. There is mild calcification of the aortic valve. Aortic valve  regurgitation is not visualized.   7. The inferior vena cava is dilated in size with <50% respiratory  variability, suggesting right atrial pressure of 15 mmHg.     Neuro/Psych  PSYCHIATRIC DISORDERS Anxiety Depression     Neuromuscular disease    GI/Hepatic Neg liver ROS,GERD  Medicated,,  Endo/Other  Hypothyroidism    Renal/GU Renal disease     Musculoskeletal  (+) Arthritis ,    Abdominal   Peds  Hematology negative hematology ROS (+)   Anesthesia Other Findings   Reproductive/Obstetrics                              Anesthesia Physical Anesthesia Plan  ASA: 3  Anesthesia Plan: General   Post-op Pain  Management: Ofirmev  IV (intra-op)*   Induction: Intravenous  PONV Risk Score and Plan: 4 or greater and Ondansetron , Treatment may vary due to age or medical condition and TIVA  Airway Management Planned: Oral ETT  Additional Equipment: None  Intra-op Plan:   Post-operative Plan: Extubation in OR  Informed Consent: I have reviewed the patients History and Physical, chart, labs and discussed the procedure including the risks, benefits and alternatives for the proposed anesthesia with the patient or authorized representative who has indicated his/her understanding and acceptance.     Dental advisory given  Plan Discussed with: CRNA  Anesthesia Plan Comments:          Anesthesia Quick Evaluation

## 2024-01-15 DIAGNOSIS — S72001A Fracture of unspecified part of neck of right femur, initial encounter for closed fracture: Secondary | ICD-10-CM | POA: Diagnosis not present

## 2024-01-15 LAB — CBC WITH DIFFERENTIAL/PLATELET
Abs Immature Granulocytes: 0.05 K/uL (ref 0.00–0.07)
Basophils Absolute: 0 K/uL (ref 0.0–0.1)
Basophils Relative: 0 %
Eosinophils Absolute: 0 K/uL (ref 0.0–0.5)
Eosinophils Relative: 0 %
HCT: 30.6 % — ABNORMAL LOW (ref 36.0–46.0)
Hemoglobin: 10 g/dL — ABNORMAL LOW (ref 12.0–15.0)
Immature Granulocytes: 1 %
Lymphocytes Relative: 8 %
Lymphs Abs: 0.7 K/uL (ref 0.7–4.0)
MCH: 32.8 pg (ref 26.0–34.0)
MCHC: 32.7 g/dL (ref 30.0–36.0)
MCV: 100.3 fL — ABNORMAL HIGH (ref 80.0–100.0)
Monocytes Absolute: 0.8 K/uL (ref 0.1–1.0)
Monocytes Relative: 9 %
Neutro Abs: 7.3 K/uL (ref 1.7–7.7)
Neutrophils Relative %: 82 %
Platelets: 92 K/uL — ABNORMAL LOW (ref 150–400)
RBC: 3.05 MIL/uL — ABNORMAL LOW (ref 3.87–5.11)
RDW: 14.6 % (ref 11.5–15.5)
WBC: 8.9 K/uL (ref 4.0–10.5)
nRBC: 0 % (ref 0.0–0.2)

## 2024-01-15 LAB — BASIC METABOLIC PANEL WITH GFR
Anion gap: 11 (ref 5–15)
BUN: 25 mg/dL — ABNORMAL HIGH (ref 8–23)
CO2: 25 mmol/L (ref 22–32)
Calcium: 8.9 mg/dL (ref 8.9–10.3)
Chloride: 101 mmol/L (ref 98–111)
Creatinine, Ser: 1.26 mg/dL — ABNORMAL HIGH (ref 0.44–1.00)
GFR, Estimated: 42 mL/min — ABNORMAL LOW (ref >60)
Glucose, Bld: 155 mg/dL — ABNORMAL HIGH (ref 70–99)
Potassium: 4.4 mmol/L (ref 3.5–5.1)
Sodium: 137 mmol/L (ref 135–145)

## 2024-01-15 LAB — SURGICAL PCR SCREEN
MRSA, PCR: NEGATIVE
Staphylococcus aureus: NEGATIVE

## 2024-01-15 MED ORDER — RIVAROXABAN 20 MG PO TABS
20.0000 mg | ORAL_TABLET | Freq: Every day | ORAL | Status: DC
  Administered 2024-01-15: 20 mg via ORAL
  Filled 2024-01-15: qty 1

## 2024-01-15 NOTE — Evaluation (Signed)
 Occupational Therapy Evaluation Patient Details Name: [name] MRN: [id number] DOB: January 07, 1936 Today's Date: 01/15/2024   History of Present Illness   Pt is 88 yo female who presents on 01/13/24 after fall getting mail sustaining R hip fx. Underwent ORIF 9/10.   PMH: OA, Lt breast Ca, CAD, afib, COPD, HTN, HLD, gout, sleep apnea, hypothyroidism, macrocytic anemia, incontinence, osteoporosis, neuropathy, plantar fascitis, fall with CHI 2019     Clinical Impressions Pt lived alone, ambulated with a rollator and was mod I in self care. She was able to drive and complete light housekeeping and meal prep. Pt presents with post operative pain, anxiety, generalized weakness and impaired standing balance. She needs +2 max assist for bed mobility and +2  min assist with RW to ambulate. Pt requires set up to total assist for ADLs. Patient will benefit from continued inpatient follow up therapy, <3 hours/day.     If plan is discharge home, recommend the following:   Two people to help with walking and/or transfers;Two people to help with bathing/dressing/bathroom;Assistance with cooking/housework;Assist for transportation;Help with stairs or ramp for entrance     Functional Status Assessment   Patient has had a recent decline in their functional status and demonstrates the ability to make significant improvements in function in a reasonable and predictable amount of time.     Equipment Recommendations   None recommended by OT     Recommendations for Other Services         Precautions/Restrictions   Precautions Precautions: Fall Recall of Precautions/Restrictions: Intact Restrictions Weight Bearing Restrictions Per Provider Order: Yes RLE Weight Bearing Per Provider Order: Weight bearing as tolerated     Mobility Bed Mobility Overal bed mobility: Needs Assistance Bed Mobility: Supine to Sit     Supine to sit: +2 for physical assistance, Max assist     General bed  mobility comments: verbal cues to sequence, assist for LEs to EOB, to raise trunk and for hips to EOB with use of bed pad    Transfers Overall transfer level: Needs assistance Equipment used: Rolling walker (2 wheels) Transfers: Sit to/from Stand Sit to Stand: +2 physical assistance, Min assist, From elevated surface           General transfer comment: cues for hand placement, increased time, assist to rise and steady      Balance Overall balance assessment: Needs assistance   Sitting balance-Leahy Scale: Fair Sitting balance - Comments: initially with posterior bias, improved to fair     Standing balance-Leahy Scale: Poor Standing balance comment: reliant on RW, can stand statically without UB support briefly                           ADL either performed or assessed with clinical judgement   ADL Overall ADL's : Needs assistance/impaired Eating/Feeding: Independent;Sitting   Grooming: Set up;Sitting   Upper Body Bathing: Minimal assistance;Sitting   Lower Body Bathing: Total assistance;+2 for physical assistance;Sit to/from stand   Upper Body Dressing : Minimal assistance;Sitting   Lower Body Dressing: Total assistance;Bed level   Toilet Transfer: Minimal assistance;+2 for physical assistance;Ambulation;Rolling walker (2 wheels)           Functional mobility during ADLs: +2 for physical assistance;Minimal assistance;Rolling walker (2 wheels)       Vision Baseline Vision/History: 1 Wears glasses Ability to See in Adequate Light: 0 Adequate Patient Visual Report: No change from baseline Additional Comments: reading glasses  Perception         Praxis         Pertinent Vitals/Pain Pain Assessment Pain Assessment: Faces Faces Pain Scale: Hurts little more Pain Location: R hip Pain Descriptors / Indicators: Discomfort, Grimacing, Guarding, Moaning Pain Intervention(s): Monitored during session, Premedicated before session, Ice applied,  Repositioned     Extremity/Trunk Assessment Upper Extremity Assessment Upper Extremity Assessment: Overall WFL for tasks assessed   Lower Extremity Assessment Lower Extremity Assessment: Defer to PT evaluation   Cervical / Trunk Assessment Cervical / Trunk Assessment: Other exceptions (weakness)   Communication Communication Communication: No apparent difficulties   Cognition Arousal: Alert Behavior During Therapy: Anxious Cognition: No apparent impairments                               Following commands: Intact       Cueing  General Comments   Cueing Techniques: Verbal cues      Exercises     Shoulder Instructions      Home Living Family/patient expects to be discharged to:: Private residence Living Arrangements: Alone Available Help at Discharge: Family;Available PRN/intermittently Type of Home: House Home Access: Ramped entrance     Home Layout: One level     Bathroom Shower/Tub: Producer, television/film/video: Standard Bathroom Accessibility: (P) Yes   Home Equipment: BSC/3in1;Grab bars - tub/shower;Rollator (4 wheels);Rolling Walker (2 wheels);Standard Walker;Cane - single point          Prior Functioning/Environment Prior Level of Function : Independent/Modified Independent;Driving             Mobility Comments: uses rollator in and out of the house ADLs Comments: independent. Has cleaning service every other week, family helped with meals.    OT Problem List: Decreased activity tolerance;Impaired balance (sitting and/or standing);Decreased strength;Decreased knowledge of use of DME or AE;Obesity;Pain   OT Treatment/Interventions: Self-care/ADL training;DME and/or AE instruction;Therapeutic activities;Patient/family education;Balance training      OT Goals(Current goals can be found in the care plan section)   Acute Rehab OT Goals OT Goal Formulation: With patient Time For Goal Achievement: 01/29/24 Potential to  Achieve Goals: Good ADL Goals Pt Will Perform Grooming: with contact guard assist;standing Pt Will Perform Lower Body Bathing: with min assist;with adaptive equipment;sit to/from stand Pt Will Perform Lower Body Dressing: with min assist;sit to/from stand;with adaptive equipment Pt Will Transfer to Toilet: with contact guard assist;ambulating;bedside commode Pt Will Perform Toileting - Clothing Manipulation and hygiene: with min assist;sit to/from stand Additional ADL Goal #1: Pt will complete bed mobility with min assist in preparation for ADLs.   OT Frequency:  Min 2X/week    Co-evaluation PT/OT/SLP Co-Evaluation/Treatment: Yes Reason for Co-Treatment: For patient/therapist safety   OT goals addressed during session: ADL's and self-care      AM-PAC OT 6 Clicks Daily Activity     Outcome Measure Help from another person eating meals?: None Help from another person taking care of personal grooming?: A Little Help from another person toileting, which includes using toliet, bedpan, or urinal?: Total Help from another person bathing (including washing, rinsing, drying)?: A Lot Help from another person to put on and taking off regular upper body clothing?: A Little Help from another person to put on and taking off regular lower body clothing?: Total 6 Click Score: 14   End of Session Equipment Utilized During Treatment: Gait belt;Rolling walker (2 wheels) Nurse Communication: Mobility status  Activity Tolerance: Patient tolerated  treatment well Patient left: in chair;with call bell/phone within reach;with chair alarm set  OT Visit Diagnosis: Unsteadiness on feet (R26.81);Other abnormalities of gait and mobility (R26.89);Pain;History of falling (Z91.81)                Time: 8886-8842 OT Time Calculation (min): 44 min Charges:  OT General Charges $OT Visit: 1 Visit OT Evaluation $OT Eval Moderate Complexity: 1 Mod  Mliss HERO, OTR/L Acute Rehabilitation Services Office:  [phone]   Kennth Mliss Helling 01/15/2024, 12:14 PM

## 2024-01-15 NOTE — Progress Notes (Signed)
 Orthopaedic Progress Note  S: Patient denies any complaints.  O:  Vitals:   01/14/24 2300 01/15/24 0349  BP: 100/70 98/76  Pulse: 80 79  Resp:    Temp: 97.9 F (36.6 C) 98.1 F (36.7 C)  SpO2: 97% 97%    Well-appearing woman in no acute distress.  She is intact sensation in the saphenous sural tibial and peroneal nerve distributions.  5 out of 5 strength in the EHL FHL gastrocsoleus .  No calf tenderness.  Thigh soft and compressible.  Clean dressings.    Labs:  Results for orders placed or performed during the hospital encounter of 01/13/24 (from the past 24 hours)  Vitamin B12     Status: None   Collection Time: 01/14/24 11:55 AM  Result Value Ref Range   Vitamin B-12 224 180 - 914 pg/mL  MRSA Next Gen by PCR, Nasal     Status: None   Collection Time: 01/14/24 12:00 PM   Specimen: Nasal Mucosa; Nasal Swab  Result Value Ref Range   MRSA by PCR Next Gen NOT DETECTED NOT DETECTED  CBC with Differential/Platelet     Status: Abnormal   Collection Time: 01/15/24  5:53 AM  Result Value Ref Range   WBC 8.9 4.0 - 10.5 K/uL   RBC 3.05 (L) 3.87 - 5.11 MIL/uL   Hemoglobin 10.0 (L) 12.0 - 15.0 g/dL   HCT 69.3 (L) 63.9 - 53.9 %   MCV 100.3 (H) 80.0 - 100.0 fL   MCH 32.8 26.0 - 34.0 pg   MCHC 32.7 30.0 - 36.0 g/dL   RDW 85.3 88.4 - 84.4 %   Platelets 92 (L) 150 - 400 K/uL   nRBC 0.0 0.0 - 0.2 %   Neutrophils Relative % 82 %   Neutro Abs 7.3 1.7 - 7.7 K/uL   Lymphocytes Relative 8 %   Lymphs Abs 0.7 0.7 - 4.0 K/uL   Monocytes Relative 9 %   Monocytes Absolute 0.8 0.1 - 1.0 K/uL   Eosinophils Relative 0 %   Eosinophils Absolute 0.0 0.0 - 0.5 K/uL   Basophils Relative 0 %   Basophils Absolute 0.0 0.0 - 0.1 K/uL   Immature Granulocytes 1 %   Abs Immature Granulocytes 0.05 0.00 - 0.07 K/uL  Basic metabolic panel with GFR     Status: Abnormal   Collection Time: 01/15/24  5:53 AM  Result Value Ref Range   Sodium 137 135 - 145 mmol/L   Potassium 4.4 3.5 - 5.1 mmol/L   Chloride  101 98 - 111 mmol/L   CO2 25 22 - 32 mmol/L   Glucose, Bld 155 (H) 70 - 99 mg/dL   BUN 25 (H) 8 - 23 mg/dL   Creatinine, Ser 8.73 (H) 0.44 - 1.00 mg/dL   Calcium  8.9 8.9 - 10.3 mg/dL   GFR, Estimated 42 (L) >60 mL/min   Anion gap 11 5 - 15    Assessment: Postop day 1 status post IM rod right hip fracture.  The patient is doing well.  Her pain is tolerable.  Will mobilize with physical therapy.  She may weight-bear as tolerated.  She can resume her Xarelto  for DVT prophylaxis.  Continue with her current pain and bowel regimen.  Disposition will be based on how she progresses with therapy.  Injuries: Right intertrochanteric fracture  Weightbearing: As tolerated  Insicional and dressing care: Dressing changes daily as needed     VTE prophylaxis: Xarelto     Dispo: Based on how the patient does  with therapy  Follow - up plan: Will follow-up with the patient while here in the hospital.  Upon discharge, she can follow-up with me in 2 weeks time   Cordella Rhein, MD, MS [facility] [phone]

## 2024-01-15 NOTE — Anesthesia Postprocedure Evaluation (Addendum)
 Anesthesia Post Note  Patient: [name]  Procedure(s) Performed: FIXATION, FRACTURE, INTERTROCHANTERIC, WITH INTRAMEDULLARY ROD (Right)     Patient location during evaluation: PACU Anesthesia Type: General Level of consciousness: awake and alert Pain management: pain level controlled Vital Signs Assessment: post-procedure vital signs reviewed and stable Respiratory status: spontaneous breathing, nonlabored ventilation and respiratory function stable Cardiovascular status: blood pressure returned to baseline and stable Postop Assessment: no apparent nausea or vomiting Anesthetic complications: no   No notable events documented.  Last Vitals:  Vitals:   01/14/24 2300 01/15/24 0349  BP: 100/70 98/76  Pulse: 80 79  Resp:    Temp: 36.6 C 36.7 C  SpO2: 97% 97%    Last Pain:  Vitals:   01/15/24 0349  TempSrc: Oral  PainSc:                  Butler Levander Pinal

## 2024-01-15 NOTE — Evaluation (Signed)
 Physical Therapy Evaluation Patient Details Name: [name] MRN: [id number] DOB: [birth year] Today's Date: 01/15/2024  History of Present Illness  Pt is 88 yo female who presents on 01/13/24 after fall getting mail sustaining R hip fx. Underwent ORIF 9/10.   PMH: OA, Lt breast Ca, CAD, afib, COPD, HTN, HLD, gout, sleep apnea, hypothyroidism, macrocytic anemia, incontinence, osteoporosis, neuropathy, plantar fascitis, fall with CHI 2019  Clinical Impression  Pt admitted with above diagnosis. Pt from home alone, has 2 daughters that can take turns staying with her. Was independent before this fall though had some help with cleaning and meal prep. Pt needed max A +2 for bed mobility. Was able to ambulate 5' with RW and min A +2. Was given handout of there ex and exercises initiated. Patient will benefit from continued inpatient follow up therapy, <3 hours/day.  Pt currently with functional limitations due to the deficits listed below (see PT Problem List). Pt will benefit from acute skilled PT to increase their independence and safety with mobility to allow discharge.           If plan is discharge home, recommend the following: A lot of help with walking and/or transfers;A lot of help with bathing/dressing/bathroom;Assistance with cooking/housework;Assist for transportation   Can travel by private vehicle   Yes    Equipment Recommendations None recommended by PT  Recommendations for Other Services       Functional Status Assessment Patient has had a recent decline in their functional status and demonstrates the ability to make significant improvements in function in a reasonable and predictable amount of time.     Precautions / Restrictions Precautions Precautions: Fall Recall of Precautions/Restrictions: Intact Restrictions Weight Bearing Restrictions Per Provider Order: Yes RLE Weight Bearing Per Provider Order: Weight bearing as tolerated      Mobility  Bed Mobility Overal bed  mobility: Needs Assistance Bed Mobility: Supine to Sit     Supine to sit: +2 for physical assistance, Max assist     General bed mobility comments: verbal cues to sequence, assist for LEs to EOB, to raise trunk and for hips to EOB with use of bed pad    Transfers Overall transfer level: Needs assistance Equipment used: Rolling walker (2 wheels) Transfers: Sit to/from Stand Sit to Stand: +2 physical assistance, Min assist, From elevated surface           General transfer comment: cues for hand placement, increased time, assist to rise and steady    Ambulation/Gait Ambulation/Gait assistance: Min assist, +2 physical assistance Gait Distance (Feet): 5 Feet Assistive device: Rolling walker (2 wheels) Gait Pattern/deviations: Step-to pattern, Antalgic Gait velocity: decreased Gait velocity interpretation: <1.31 ft/sec, indicative of household ambulator   General Gait Details: pt with increased pain with wt shift to R, fatigued quiclky with ambulation and began to feel nauseous, could not tolerate more than 5'  Stairs            Wheelchair Mobility     Tilt Bed    Modified Rankin (Stroke Patients Only)       Balance Overall balance assessment: Needs assistance Sitting-balance support: Single extremity supported Sitting balance-Leahy Scale: Fair Sitting balance - Comments: initially with posterior bias, improved to fair Postural control: Posterior lean Standing balance support: Bilateral upper extremity supported, During functional activity, Reliant on assistive device for balance Standing balance-Leahy Scale: Poor Standing balance comment: reliant on RW, can stand statically without UB support briefly  Pertinent Vitals/Pain Pain Assessment Pain Assessment: Faces Faces Pain Scale: Hurts little more Pain Location: R hip Pain Descriptors / Indicators: Discomfort, Grimacing, Guarding, Moaning Pain Intervention(s): Limited  activity within patient's tolerance, Monitored during session    Home Living Family/patient expects to be discharged to:: Private residence Living Arrangements: Alone Available Help at Discharge: Family;Available PRN/intermittently Type of Home: House Home Access: Ramped entrance       Home Layout: One level Home Equipment: BSC/3in1;Grab bars - tub/shower;Rollator (4 wheels);Rolling Walker (2 wheels);Standard Walker;Cane - single point Additional Comments: 2 daughters can take turns staying with her    Prior Function Prior Level of Function : Independent/Modified Independent;Driving             Mobility Comments: uses rollator in and out of the house ADLs Comments: independent. Has cleaning service every other week, family helped with meals.     Extremity/Trunk Assessment   Upper Extremity Assessment Upper Extremity Assessment: Defer to OT evaluation    Lower Extremity Assessment Lower Extremity Assessment: RLE deficits/detail RLE Deficits / Details: hip flex 2/5, knee ext 2/5, knee and ankle ROM WFL, limited testing due to pain RLE: Unable to fully assess due to pain RLE Sensation: WNL RLE Coordination: decreased gross motor    Cervical / Trunk Assessment Cervical / Trunk Assessment: Other exceptions (weakness) Cervical / Trunk Exceptions: core weakness and increased body habitus esp through hips and buttocks  Communication   Communication Communication: No apparent difficulties    Cognition Arousal: Alert Behavior During Therapy: Anxious   PT - Cognitive impairments: Memory                       PT - Cognition Comments: STM deficits noted, needed repeat teaching on WB status and several other things. Also very verbose with her anxiety and needed some redirection to task Following commands: Intact       Cueing Cueing Techniques: Verbal cues     General Comments General comments (skin integrity, edema, etc.): BP 121/70 after session, HR 91  bpm    Exercises General Exercises - Lower Extremity Ankle Circles/Pumps: AROM, Both, 10 reps, Seated Quad Sets: AROM, Both, 10 reps, Seated Gluteal Sets: AROM, Both, 10 reps, Seated Long Arc Quad: AROM, Right, 5 reps, Seated   Assessment/Plan    PT Assessment Patient needs continued PT services  PT Problem List Decreased strength;Decreased range of motion;Decreased activity tolerance;Decreased balance;Decreased mobility;Decreased knowledge of use of DME;Decreased safety awareness;Decreased knowledge of precautions;Pain;Obesity       PT Treatment Interventions DME instruction;Gait training;Functional mobility training;Therapeutic activities;Therapeutic exercise;Balance training;Neuromuscular re-education;Patient/family education    PT Goals (Current goals can be found in the Care Plan section)  Acute Rehab PT Goals Patient Stated Goal: return home PT Goal Formulation: With patient Time For Goal Achievement: 01/29/24 Potential to Achieve Goals: Good    Frequency Min 2X/week     Co-evaluation PT/OT/SLP Co-Evaluation/Treatment: Yes Reason for Co-Treatment: For patient/therapist safety PT goals addressed during session: Mobility/safety with mobility;Balance;Proper use of DME;Strengthening/ROM OT goals addressed during session: ADL's and self-care       AM-PAC PT 6 Clicks Mobility  Outcome Measure Help needed turning from your back to your side while in a flat bed without using bedrails?: A Lot Help needed moving from lying on your back to sitting on the side of a flat bed without using bedrails?: Total Help needed moving to and from a bed to a chair (including a wheelchair)?: Total Help needed standing up from a  chair using your arms (e.g., wheelchair or bedside chair)?: Total Help needed to walk in hospital room?: Total Help needed climbing 3-5 steps with a railing? : Total 6 Click Score: 7    End of Session Equipment Utilized During Treatment: Gait belt Activity  Tolerance: Patient limited by fatigue;Patient limited by pain Patient left: in chair;with call bell/phone within reach;with chair alarm set Nurse Communication: Mobility status PT Visit Diagnosis: Muscle weakness (generalized) (M62.81);Pain;Difficulty in walking, not elsewhere classified (R26.2) Pain - Right/Left: Right Pain - part of body: Hip    Time: 8887-8846 PT Time Calculation (min) (ACUTE ONLY): 41 min   Charges:   PT Evaluation $PT Eval Moderate Complexity: 1 Mod PT Treatments $Gait Training: 8-22 mins PT General Charges $$ ACUTE PT VISIT: 1 Visit         Richerd Lipoma, PT  Acute Rehab Services Secure chat preferred Office [phone]   Richerd CROME Geramy Lamorte 01/15/2024, 2:08 PM

## 2024-01-15 NOTE — Plan of Care (Signed)
  Problem: Pain Managment: Goal: General experience of comfort will improve and/or be controlled Outcome: Progressing

## 2024-01-15 NOTE — [place] FL2 (Signed)
 [place]  MEDICAID FL2 LEVEL OF CARE FORM     IDENTIFICATION  Patient Name: [name] Birthdate: 02-Mar-1936 Sex: female Admission Date (Current Location): 01/13/2024  [facility] and [place] Number:  Producer, television/film/video and Address:  [facility], [address] [place], [place] [address]      Provider Number: [id number]  Attending Physician Name and Address:  Donnamarie Lebron PARAS, MD  Relative Name and Phone Number:  Letitia Clarity Daughter   [phone]    Current Level of Care: Hospital Recommended Level of Care: Skilled Nursing Facility Prior Approval Number:    Date Approved/Denied:   PASRR Number: [id number]  Discharge Plan: SNF    Current Diagnoses: Patient Active Problem List   Diagnosis Date Noted   Hip fracture (HCC) 01/13/2024   OSA (obstructive sleep apnea) 01/13/2024   Leg wound, right 09/06/2023   Closed nondisplaced fracture of second cervical vertebra (HCC) 09/05/2023   Acute renal failure superimposed on chronic kidney disease (HCC) 12/16/2022   Cloudy urine 11/25/2022   Insomnia 07/27/2022   Vitamin D  deficiency 07/27/2022   Pulmonary hypertension (HCC) 11/07/2021   Bronchiectasis without complication (HCC) 11/07/2021   Hepatic cirrhosis (HCC) 11/06/2021   ILD (interstitial lung disease) (HCC) 10/03/2021   Abnormal LFTs 09/28/2021   Weight loss 08/19/2021   Chronic respiratory failure with hypoxia (HCC) 08/19/2021   Debility 06/29/2021   Elevated troponin 06/12/2021   Febrile illness 05/22/2021   Anxiety state 10/23/2020   Pure hypercholesterolemia 10/23/2020   Atherosclerosis of coronary artery 10/23/2020   Sinus node dysfunction (HCC) 10/23/2020   Mixed hyperlipidemia 10/23/2020   OSA on CPAP 10/23/2020   Fall 09/05/2020   Nausea 04/23/2020   Constipation 04/23/2020   Posterior uveitis, 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   Dysphagia 01/21/2020   CHF (congestive heart failure) (HCC) 11/23/2019   Osteoarthritis 09/19/2018    Whiplash 06/10/2017   Cough 05/29/2017   Concussion 05/29/2017   Hammer toes of both feet 12/19/2016   Allergic rhinitis 12/09/2016   Pedal edema 09/17/2016   Chest pain 07/18/2016   Gross hematuria 04/26/2016   Prediabetes 03/21/2016   Dysuria 12/07/2015   Choroidal nevus, 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   Malignant neoplasm of left female breast (HCC) 10/10/2015   Anxiety and depression 06/30/2015   Encounter for well adult exam with abnormal findings 06/30/2015   Hypothyroidism 03/24/2015   Gout 03/24/2015   Essential hypertension 03/24/2015   Asthma 03/24/2015   Peripheral neuropathy 03/24/2015   Osteoporosis 03/24/2015   Morbid obesity (HCC) 03/24/2015   Long term current use of anticoagulant therapy 03/16/2015   Lymphadenopathy, cervical 03/16/2015   Hyperbilirubinemia 03/16/2015   Coronary arteriosclerosis 09/22/2014   Hypokalemia 02/18/2013   Elevated bilirubin 02/18/2013   Morbid obesity with BMI of 45.0-49.9, adult (HCC) 10/29/2012   Rotator cuff tear arthropathy 10/29/2012   DJD (degenerative joint disease) of knee 10/29/2012   Arthritis    Hx of radiation therapy    Atrial fibrillation, chronic (HCC) 01/21/2011   Malignant neoplasm of upper-outer quadrant of left breast in female, estrogen receptor positive (HCC) 12/26/2010   Breast cancer (ILC) Receptor + her2 _ 12/26/2010    Orientation RESPIRATION BLADDER Height & Weight     Self, Time, Situation, Place  Normal Incontinent, External catheter Weight: 212 lb (96.2 kg) Height:  5' 5 (165.1 cm)  BEHAVIORAL SYMPTOMS/MOOD NEUROLOGICAL BOWEL NUTRITION STATUS        Diet (see discharge summary)  AMBULATORY STATUS COMMUNICATION OF NEEDS Skin   Limited Assist  Verbally Surgical wounds, Skin abrasions, Other (Comment) (ecchymosis)                       Personal Care Assistance Level of Assistance  Bathing, Feeding, Dressing, Total care Bathing Assistance: Maximum assistance Feeding assistance: Independent Dressing  Assistance: Maximum assistance Total Care Assistance: Maximum assistance   Functional Limitations Info  Sight, Hearing, Speech Sight Info: Adequate Hearing Info: Adequate Speech Info: Adequate    SPECIAL CARE FACTORS FREQUENCY  PT (By licensed PT), OT (By licensed OT)     PT Frequency: 5x week OT Frequency: 5x week            Contractures Contractures Info: Not present    Additional Factors Info  Code Status, Allergies Code Status Info: full Allergies Info: Crestor  (Rosuvastatin  Calcium ), Hydrocodone -acetaminophen , Lidocaine  Hcl, Lipitor (Atorvastatin  Calcium ), Procaine, Rosuvastatin , Theophylline, Vicodin (Hydrocodone -acetaminophen ), Codeine, Ephedrine, Other, Oxycodone, Oxycodone-acetaminophen , Propoxyphene, Quinine Derivatives, Sulfa  Antibiotics, Sulfur , Epinephrine , Penicillins, Quinine, Theophyllines           Current Medications (01/15/2024):  This is the current hospital active medication list Current Facility-Administered Medications  Medication Dose Route Frequency Provider Last Rate Last Admin   acetaminophen  (TYLENOL ) tablet 1,000 mg  1,000 mg Oral Q6H Reyne Cordella SQUIBB, MD   1,000 mg at 01/15/24 1233   albuterol  (PROVENTIL ) (2.5 MG/3ML) 0.083% nebulizer solution 2.5 mg  2.5 mg Nebulization Q6H PRN Chen, Lydia D, RPH       allopurinol  (ZYLOPRIM ) tablet 300 mg  300 mg Oral Daily Franky Redia SAILOR, MD   300 mg at 01/15/24 9078   cyanocobalamin  (VITAMIN B12) tablet 1,000 mcg  1,000 mcg Oral Daily Ezenduka, Nkeiruka J, MD   1,000 mcg at 01/15/24 9078   docusate sodium  (COLACE) capsule 100 mg  100 mg Oral BID Reyne Cordella SQUIBB, MD   100 mg at 01/15/24 9078   fentaNYL  (SUBLIMAZE ) injection 50 mcg  50 mcg Intravenous Q3H PRN Kakrakandy, Arshad N, MD   50 mcg at 01/14/24 2130   gabapentin  (NEURONTIN ) capsule 300 mg  300 mg Oral QHS Franky Redia SAILOR, MD   300 mg at 01/14/24 2134   levothyroxine  (SYNTHROID ) tablet 75 mcg  75 mcg Oral Q0600 Kakrakandy, Arshad N, MD    75 mcg at 01/15/24 9352   methocarbamol  (ROBAXIN ) injection 500 mg  500 mg Intravenous Q6H PRN Kakrakandy, Arshad N, MD   500 mg at 01/14/24 9482   metoprolol  succinate (TOPROL -XL) 24 hr tablet 12.5 mg  12.5 mg Oral Daily Franky Redia SAILOR, MD   12.5 mg at 01/15/24 9078   ondansetron  (ZOFRAN ) tablet 4 mg  4 mg Oral Q6H PRN Reyne Cordella SQUIBB, MD       Or   ondansetron  (ZOFRAN ) injection 4 mg  4 mg Intravenous Q6H PRN Reyne Cordella SQUIBB, MD   4 mg at 01/14/24 1802   polyethylene glycol (MIRALAX  / GLYCOLAX ) packet 17 g  17 g Oral Daily PRN Reyne Cordella SQUIBB, MD       rivaroxaban  (XARELTO ) tablet 20 mg  20 mg Oral Q supper Ezenduka, Nkeiruka J, MD       traMADol  (ULTRAM ) tablet 50 mg  50 mg Oral Q6H PRN Reyne Cordella SQUIBB, MD         Discharge Medications: Please see discharge summary for a list of discharge medications.  Relevant Imaging Results:  Relevant Lab Results:   Additional Information SSN: [id number]  Bridget Cordella Simmonds, LCSW

## 2024-01-15 NOTE — TOC Initial Note (Signed)
 Transition of Care [facility]) - Initial/Assessment Note    Patient Details  Name: [name] MRN: [id number] Date of Birth: [birth year]  Transition of Care [facility]) CM/SW Contact:    Bridget Cordella Simmonds, LCSW Phone Number: 01/15/2024, 3:20 PM  Clinical Narrative:    CSW met with pt and daughter [name] regarding PT recommendation for SNF.  Permission given to speak with [name] and other daughter [name].  Pt from home alone, no current services, [name] lives nearby.  Pt indicating that she hopes she will improve enough to DC straight home.  Quite a bit of discussion about this and how much help pt would need to DC home with [facility], how much support HH could offer.  In the end, pt agrees for CSW to send out SNF referral in hub, medicare choice document provided, but pt still hopeful that she will make progress tomorrow that will allow DC home.  Referral sent out in hub for SNF.                Expected Discharge Plan: Skilled Nursing Facility Barriers to Discharge: Continued Medical Work up, SNF Pending bed offer   Patient Goals and CMS Choice Patient states their goals for this hospitalization and ongoing recovery are:: get back to what I was able to do CMS Medicare.gov Compare Post Acute Care list provided to:: Patient Choice offered to / list presented to : Patient, Adult Children (daughter [name])      Expected Discharge Plan and Services In-house Referral: Clinical Social Work   Post Acute Care Choice:  (TBD) Living arrangements for the past 2 months: Single Family Home                                      Prior Living Arrangements/Services Living arrangements for the past 2 months: Single Family Home Lives with:: Self Patient language and need for interpreter reviewed:: Yes Do you feel safe going back to the place where you live?: Yes      Need for Family Participation in Patient Care: Yes (Comment) Care giver support system in place?: Yes (comment) Current home services: Other  (comment) (none) Criminal Activity/Legal Involvement Pertinent to Current Situation/Hospitalization: No - Comment as needed  Activities of Daily Living      Permission Sought/Granted Permission sought to share information with : Family Supports Permission granted to share information with : Yes, Verbal Permission Granted  Share Information with NAME: daughters [name] and [name]  Permission granted to share info w AGENCY: SNF        Emotional Assessment Appearance:: Appears stated age Attitude/Demeanor/Rapport: Engaged Affect (typically observed): Appropriate, Pleasant Orientation: : Oriented to Self, Oriented to Place, Oriented to  Time, Oriented to Situation      Admission diagnosis:  Hip fracture (HCC) [S72.009A] Fall, initial encounter [W19.XXXA] Closed fracture of right hip, initial encounter [facility]) [S72.001A] Patient Active Problem List   Diagnosis Date Noted   Hip fracture (HCC) 01/13/2024   OSA (obstructive sleep apnea) 01/13/2024   Leg wound, right 09/06/2023   Closed nondisplaced fracture of second cervical vertebra (HCC) 09/05/2023   Acute renal failure superimposed on chronic kidney disease (HCC) 12/16/2022   Cloudy urine 11/25/2022   Insomnia 07/27/2022   Vitamin D  deficiency 07/27/2022   Pulmonary hypertension (HCC) 11/07/2021   Bronchiectasis without complication (HCC) 11/07/2021   Hepatic cirrhosis (HCC) 11/06/2021   ILD (interstitial lung disease) (HCC) 10/03/2021   Abnormal  LFTs 09/28/2021   Weight loss 08/19/2021   Chronic respiratory failure with hypoxia (HCC) 08/19/2021   Debility 06/29/2021   Elevated troponin 06/12/2021   Febrile illness 05/22/2021   Anxiety state 10/23/2020   Pure hypercholesterolemia 10/23/2020   Atherosclerosis of coronary artery 10/23/2020   Sinus node dysfunction (HCC) 10/23/2020   Mixed hyperlipidemia 10/23/2020   OSA on CPAP 10/23/2020   Fall 09/05/2020   Nausea 04/23/2020   Constipation 04/23/2020   Posterior uveitis,  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   Dysphagia 01/21/2020   CHF (congestive heart failure) (HCC) 11/23/2019   Osteoarthritis 09/19/2018   Whiplash 06/10/2017   Cough 05/29/2017   Concussion 05/29/2017   Hammer toes of both feet 12/19/2016   Allergic rhinitis 12/09/2016   Pedal edema 09/17/2016   Chest pain 07/18/2016   Gross hematuria 04/26/2016   Prediabetes 03/21/2016   Dysuria 12/07/2015   Choroidal nevus, 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   Malignant neoplasm of left female breast (HCC) 10/10/2015   Anxiety and depression 06/30/2015   Encounter for well adult exam with abnormal findings 06/30/2015   Hypothyroidism 03/24/2015   Gout 03/24/2015   Essential hypertension 03/24/2015   Asthma 03/24/2015   Peripheral neuropathy 03/24/2015   Osteoporosis 03/24/2015   Morbid obesity (HCC) 03/24/2015   Long term current use of anticoagulant therapy 03/16/2015   Lymphadenopathy, cervical 03/16/2015   Hyperbilirubinemia 03/16/2015   Coronary arteriosclerosis 09/22/2014   Hypokalemia 02/18/2013   Elevated bilirubin 02/18/2013   Morbid obesity with BMI of 45.0-49.9, adult (HCC) 10/29/2012   Rotator cuff tear arthropathy 10/29/2012   DJD (degenerative joint disease) of knee 10/29/2012   Arthritis    Hx of radiation therapy    Atrial fibrillation, chronic (HCC) 01/21/2011   Malignant neoplasm of upper-outer quadrant of left breast in female, estrogen receptor positive (HCC) 12/26/2010   Breast cancer (ILC) Receptor + her2 _ 12/26/2010   PCP:  Norleen Lynwood ORN, MD Pharmacy:   [id number] - [place], [place] - [address] AT [facility] OF [address] [place] [address] Phone: [phone] Fax: [phone]  [facility] [id number] [place], [place] - [address] [place] [address] Phone: [phone] Fax: [phone]  [id number] - [place], [place] - [address] AT [facility] OF [address] [place] [address] Phone: [phone] Fax: [phone]     Social Drivers of Health (SDOH) Social History: SDOH Screenings   Food Insecurity: No Food Insecurity (01/14/2024)  Housing: Low Risk  (01/14/2024)  Transportation Needs: No Transportation Needs (01/14/2024)  Utilities: Not At Risk (01/14/2024)  Alcohol Screen: Low Risk  (11/10/2023)  Depression (PHQ2-9): Low Risk  (11/10/2023)  Financial Resource Strain: Low Risk  (11/10/2023)  Physical Activity: Insufficiently Active (11/10/2023)  Social Connections: Unknown (01/14/2024)  Stress: No Stress Concern Present (11/10/2023)  Tobacco Use: Medium Risk (01/14/2024)  Health Literacy: Adequate Health Literacy (11/10/2023)   SDOH Interventions:     Readmission Risk Interventions    06/27/2021    3:36 PM 06/15/2021    9:47 AM  Readmission Risk Prevention Plan  Transportation Screening Complete Complete  PCP or Specialist Appt within 3-5 Days  Complete  HRI or Home Care Consult  Complete  Social Work Consult for Recovery Care Planning/Counseling  Complete  Palliative Care Screening  Complete  Medication Review [employer]) Complete Complete  PCP or Specialist appointment within 3-5 days of discharge Complete  HRI or Home Care Consult Complete   SW Recovery Care/Counseling Consult Complete   Palliative Care Screening Not Applicable   Skilled Nursing Facility Not Applicable

## 2024-01-15 NOTE — TOC CAGE-AID Note (Signed)
 Transition of Care [facility]) - CAGE-AID Screening   Patient Details  Name: [name] MRN: [id number] Date of Birth: 1935/08/15  [facility]) CM/SW Contact:    Jeanclaude Wentworth E Dijon Cosens, LCSW Phone Number: 01/15/2024, 9:13 AM   Clinical Narrative: No SA noted.   CAGE-AID Screening:    Have You Ever Felt You Ought to Cut Down on Your Drinking or Drug Use?: No Have People Annoyed You By Critizing Your Drinking Or Drug Use?: No Have You Felt Bad Or Guilty About Your Drinking Or Drug Use?: No Have You Ever Had a Drink or Used Drugs First Thing In The Morning to Steady Your Nerves or to Get Rid of a Hangover?: No CAGE-AID Score: 0  Substance Abuse Education Offered: No

## 2024-01-15 NOTE — Progress Notes (Signed)
 PROGRESS NOTE  [name] FMW:[id number] DOB: 10/09/1935 DOA: 01/13/2024 PCP: Norleen Lynwood ORN, MD  HPI/Recap of past 24 hours: [name] is a 88 y.o. female with history of CAD status post PCI, atrial fibrillation, hyperlipidemia, sleep apnea, gout, hypothyroidism, macrocytic anemia was brought to the ER after patient had a fall.  Patient states she was picking the mail when she slipped and fell.  Did not hit her head.  Did not lose consciousness. In the ER CT scan showed a right hip fracture.  Orthopedic on-call was consulted.  Labs show hemoglobin of 11.5 macrocytic picture.  Creatinine 0.9.  Platelets 103.  Patient admitted for right hip fracture.    Today, patient denies any new complaints.  Reports controlled postop pain.  PT/OT recommending SNF, patient hoping to progress well to go home instead.    Assessment/Plan: Principal Problem:   Hip fracture (HCC) Active Problems:   Malignant neoplasm of upper-outer quadrant of left breast in female, estrogen receptor positive (HCC)   Hypothyroidism   Gout   Essential hypertension   Atrial fibrillation, chronic (HCC)   Mixed hyperlipidemia   OSA (obstructive sleep apnea)   Right hip fracture Due to mechanical fall Orthopedics consulted, s/p intramedullary rod on 01/14/2024 Other management including pain management, DVT PPx as per orthopedics  Hypokalemia Replace as needed  CAD s/p PCI Denies any chest pain Continue beta-blockers, statins, Zetia  Telemetry  Paroxysmal A-fib Currently rate controlled Continue beta-blockers, Xarelto  Telemetry  Hypertension Continue Toprol , HCTZ on hold for now  Macrocytic anemia Vitamin B12 deficiency Vitamin B12 supplementation  Hypothyroidism Continue Synthroid   Chronic pain syndrome Continue gabapentin   Gout Continue allopurinol   History of asthma Takes as needed Symbicort   OSA CPAP at bedtime  History of anxiety Continue Lexapro   Obesity class II Lifestyle  modification advised Reports significant weight loss after her husband passed 2 years ago     Estimated body mass index is 35.28 kg/m as calculated from the following:   Height as of this encounter: 5' 5 (1.651 m).   Weight as of this encounter: 96.2 kg.     Code Status: Full  Family Communication: None at bedside  Disposition Plan: Status is: Inpatient Remains inpatient appropriate because: Level of care      Consultants: Orthopedics  Procedures: Surgical as above  Antimicrobials: None  DVT prophylaxis: Xarelto  as per orthopedic surgeon   Objective: Vitals:   01/15/24 0816 01/15/24 0921 01/15/24 1118 01/15/24 1553  BP: (!) (P) 85/73 91/63 116/62 110/73  Pulse: (P) 82 86 91   Resp: (P) 17  17 18   Temp: (P) 97.9 F (36.6 C)   97.9 F (36.6 C)  TempSrc: (P) Oral     SpO2: (P) 97%  98% 95%  Weight:      Height:        Intake/Output Summary (Last 24 hours) at 01/15/2024 1933 Last data filed at 01/15/2024 1700 Gross per 24 hour  Intake 1300 ml  Output --  Net 1300 ml   Filed Weights   01/13/24 2050  Weight: 96.2 kg    Exam: General: NAD  Cardiovascular: S1, S2 present Respiratory: CTAB Abdomen: Soft, nontender, nondistended, bowel sounds present Musculoskeletal: No bilateral pedal edema noted, right hip dressing C/D/I Skin: Normal Psychiatry: Normal mood     Data Reviewed: CBC: Recent Labs  Lab 01/13/24 2012 01/14/24 0546 01/15/24 0553  WBC 8.2 8.2 8.9  NEUTROABS 6.5  --  7.3  HGB 11.5* 11.2* 10.0*  HCT 35.8* 34.2*  30.6*  MCV 102.9* 100.3* 100.3*  PLT 103* 100* 92*   Basic Metabolic Panel: Recent Labs  Lab 01/13/24 2012 01/14/24 0546 01/15/24 0553  NA 141 140 137  K 3.7 3.3* 4.4  CL 109 106 101  CO2 20* 26 25  GLUCOSE 101* 125* 155*  BUN 22 21 25*  CREATININE 0.95 1.02* 1.26*  CALCIUM  8.4* 8.8* 8.9  MG  --  1.8  --    GFR: Estimated Creatinine Clearance: 36.8 mL/min (A) (by C-G formula based on SCr of 1.26 mg/dL  (H)). Liver Function Tests: Recent Labs  Lab 01/13/24 2012  AST 24  ALT 10  ALKPHOS 56  BILITOT 1.1  PROT 5.1*  ALBUMIN 3.1*   No results for input(s): LIPASE, AMYLASE in the last 168 hours. No results for input(s): AMMONIA in the last 168 hours. Coagulation Profile: Recent Labs  Lab 01/13/24 2012  INR 1.4*   Cardiac Enzymes: No results for input(s): CKTOTAL, CKMB, CKMBINDEX, TROPONINI in the last 168 hours. BNP (last 3 results) No results for input(s): PROBNP in the last 8760 hours. HbA1C: No results for input(s): HGBA1C in the last 72 hours. CBG: No results for input(s): GLUCAP in the last 168 hours. Lipid Profile: No results for input(s): CHOL, HDL, LDLCALC, TRIG, CHOLHDL, LDLDIRECT in the last 72 hours. Thyroid  Function Tests: No results for input(s): TSH, T4TOTAL, FREET4, T3FREE, THYROIDAB in the last 72 hours. Anemia Panel: Recent Labs    01/14/24 0546 01/14/24 1155  VITAMINB12  --  224  FOLATE 18.3  --    Urine analysis:    Component Value Date/Time   COLORURINE YELLOW 09/09/2023 1558   APPEARANCEUR Cloudy (A) 09/09/2023 1558   LABSPEC 1.025 09/09/2023 1558   PHURINE 6.0 09/09/2023 1558   GLUCOSEU NEGATIVE 09/09/2023 1558   HGBUR NEGATIVE 09/09/2023 1558   BILIRUBINUR NEGATIVE 09/09/2023 1558   BILIRUBINUR negative 04/26/2016 1526   KETONESUR NEGATIVE 09/09/2023 1558   PROTEINUR NEGATIVE 06/12/2021 1458   UROBILINOGEN 1.0 09/09/2023 1558   NITRITE POSITIVE (A) 09/09/2023 1558   LEUKOCYTESUR TRACE (A) 09/09/2023 1558   Sepsis Labs: @LABRCNTIP (procalcitonin:4,lacticidven:4)  ) Recent Results (from the past 240 hours)  MRSA Next Gen by PCR, Nasal     Status: None   Collection Time: 01/14/24 12:00 PM   Specimen: Nasal Mucosa; Nasal Swab  Result Value Ref Range Status   MRSA by PCR Next Gen NOT DETECTED NOT DETECTED Final    Comment: (NOTE) The GeneXpert MRSA Assay (FDA approved for NASAL specimens  only), is one component of a comprehensive MRSA colonization surveillance program. It is not intended to diagnose MRSA infection nor to guide or monitor treatment for MRSA infections. Test performance is not FDA approved in patients less than 31 years old. Performed at [facility], [address], [place], [place] [address]   Surgical pcr screen     Status: None   Collection Time: 01/14/24 12:00 PM   Specimen: Nasal Mucosa; Nasal Swab  Result Value Ref Range Status   MRSA, PCR NEGATIVE NEGATIVE Final   Staphylococcus aureus NEGATIVE NEGATIVE Final    Comment: (NOTE) The Xpert SA Assay (FDA approved for NASAL specimens in patients 58 years of age and older), is one component of a comprehensive surveillance program. It is not intended to diagnose infection nor to guide or monitor treatment. Performed at [facility], [address], [place], [place] [address]       Studies: No results found.   Scheduled Meds:  allopurinol   300 mg Oral Daily   vitamin B-12  1,000 mcg Oral Daily   docusate sodium   100 mg Oral BID   gabapentin   300 mg Oral QHS   levothyroxine   75 mcg Oral Q0600   metoprolol  succinate  12.5 mg Oral Daily   rivaroxaban   20 mg Oral Q supper    Continuous Infusions:     LOS: 2 days     Lebron JINNY Cage, MD [facility]  If 7PM-7AM, please contact night-coverage [email or web address] 01/15/2024, 7:33 PM

## 2024-01-15 NOTE — Progress Notes (Signed)
 Initial Nutrition Assessment  DOCUMENTATION CODES:   Obesity unspecified  INTERVENTION:  Encouraged adequate intake of meals to help meet increased needs for post op healing  If intake begins to decline, recommend ONS: Ensure Plus High Protein po BID, each supplement provides 350 kcal and 20 grams of protein. Magic cup TID with meals, each supplement provides 290 kcal and 9 grams of protein  Discussed importance of high quality protein intake for post op recovery  NUTRITION DIAGNOSIS:   Increased nutrient needs related to post-op healing as evidenced by estimated needs.  GOAL:   Patient will meet greater than or equal to 90% of their needs  MONITOR:   PO intake  REASON FOR ASSESSMENT:   Consult Assessment of nutrition requirement/status  ASSESSMENT:   Pt with hx of breast cancer, COPD, hypothyroidism, HTN, osteoporosis, atrial fibrillation, and CAD. Admitted after fall, diagnosed R hip fx.  9/9 admitted 9/10 fixation w/ IM rod R hip  Spoke with pt who was pleasantly resting in bed. Pt reports pain is well managed and reports no GI discomforts at this time. Pt reports good appetite following surgery and reports eating 75-100% of last few meals. Pt states she has been ordering protein with all her meals and does not feel like she needs any ONS at this time. Will continue to monitor intake and assess need for ONS.   PTA, pt reports very good appetite. Reports eating 3x per day and eating a variety of foods. Pt states she loves to cook and is very independent. Pt eats well rounded meals and includes protein at every meal. Pt reports finishing most of her meals and rarely having leftovers. Pt does not avoid any foods. Pt has very good support system and states she believes even with limited mobility following surgery, her daughters will make sure pt will have enough food stocked in fridge to help meet her nutrition needs. Suspect pt has been meeting nutrition needs and will  continue to meet needs with good appetite.  No recent wt loss reported by pt and physical exam shows adequate muscle stores. Do not suspect any degree of malnutrition at this time.  Discussed importance of protein intake while recovering from surgery and encouraged pt to ask for ONS if she feels like her appetite declines.   Medications reviewed and include:  Vitamin B12 Colace Levothyroxine  Cefazolin    Labs reviewed:  BUN 25/ Creatinine 1.26   NUTRITION - FOCUSED PHYSICAL EXAM:  Flowsheet Row Most Recent Value  Orbital Region Mild depletion  Upper Arm Region No depletion  Thoracic and Lumbar Region No depletion  Buccal Region Mild depletion  Temple Region Mild depletion  Clavicle Bone Region No depletion  Clavicle and Acromion Bone Region No depletion  Scapular Bone Region No depletion  Dorsal Hand No depletion  Patellar Region No depletion  Anterior Thigh Region No depletion  Posterior Calf Region No depletion  Edema (RD Assessment) None  Hair Reviewed  Eyes Reviewed  Mouth Reviewed  Skin Reviewed  Nails Reviewed    Diet Order:   Diet Order             Diet regular Room service appropriate? Yes; Fluid consistency: Thin  Diet effective now                   EDUCATION NEEDS:   Education needs have been addressed  Skin:  Skin Assessment: Reviewed RN Assessment  Last BM:  9/10  Height:   Ht Readings from Last 1 Encounters:  01/13/24 5' 5 (1.651 m)    Weight:   Wt Readings from Last 1 Encounters:  01/13/24 96.2 kg    Ideal Body Weight:  56.8 kg  BMI:  Body mass index is 35.28 kg/m.  Estimated Nutritional Needs:   Kcal:  1500-1700  Protein:  70-90g  Fluid:  1.5-1.7L    Josette Glance, MS, RDN, LDN Clinical Dietitian I Please reach out via secure chat

## 2024-01-16 ENCOUNTER — Encounter (HOSPITAL_COMMUNITY): Payer: Self-pay

## 2024-01-16 DIAGNOSIS — J449 Chronic obstructive pulmonary disease, unspecified: Secondary | ICD-10-CM | POA: Diagnosis not present

## 2024-01-16 DIAGNOSIS — Z741 Need for assistance with personal care: Secondary | ICD-10-CM | POA: Diagnosis not present

## 2024-01-16 DIAGNOSIS — Z9181 History of falling: Secondary | ICD-10-CM | POA: Diagnosis not present

## 2024-01-16 DIAGNOSIS — J4540 Moderate persistent asthma, uncomplicated: Secondary | ICD-10-CM | POA: Insufficient documentation

## 2024-01-16 DIAGNOSIS — M109 Gout, unspecified: Secondary | ICD-10-CM | POA: Diagnosis not present

## 2024-01-16 DIAGNOSIS — I2510 Atherosclerotic heart disease of native coronary artery without angina pectoris: Secondary | ICD-10-CM | POA: Diagnosis not present

## 2024-01-16 DIAGNOSIS — Z4789 Encounter for other orthopedic aftercare: Secondary | ICD-10-CM | POA: Diagnosis not present

## 2024-01-16 DIAGNOSIS — S72001A Fracture of unspecified part of neck of right femur, initial encounter for closed fracture: Secondary | ICD-10-CM | POA: Diagnosis not present

## 2024-01-16 DIAGNOSIS — K219 Gastro-esophageal reflux disease without esophagitis: Secondary | ICD-10-CM | POA: Diagnosis not present

## 2024-01-16 DIAGNOSIS — R41841 Cognitive communication deficit: Secondary | ICD-10-CM | POA: Insufficient documentation

## 2024-01-16 DIAGNOSIS — M1990 Unspecified osteoarthritis, unspecified site: Secondary | ICD-10-CM | POA: Diagnosis not present

## 2024-01-16 DIAGNOSIS — J45909 Unspecified asthma, uncomplicated: Secondary | ICD-10-CM | POA: Diagnosis not present

## 2024-01-16 DIAGNOSIS — G629 Polyneuropathy, unspecified: Secondary | ICD-10-CM | POA: Diagnosis not present

## 2024-01-16 DIAGNOSIS — N390 Urinary tract infection, site not specified: Secondary | ICD-10-CM | POA: Diagnosis not present

## 2024-01-16 DIAGNOSIS — G4733 Obstructive sleep apnea (adult) (pediatric): Secondary | ICD-10-CM | POA: Diagnosis not present

## 2024-01-16 DIAGNOSIS — R2681 Unsteadiness on feet: Secondary | ICD-10-CM | POA: Diagnosis not present

## 2024-01-16 DIAGNOSIS — I482 Chronic atrial fibrillation, unspecified: Secondary | ICD-10-CM | POA: Diagnosis not present

## 2024-01-16 DIAGNOSIS — S79919A Unspecified injury of unspecified hip, initial encounter: Secondary | ICD-10-CM | POA: Diagnosis not present

## 2024-01-16 DIAGNOSIS — I10 Essential (primary) hypertension: Secondary | ICD-10-CM | POA: Diagnosis not present

## 2024-01-16 DIAGNOSIS — R269 Unspecified abnormalities of gait and mobility: Secondary | ICD-10-CM | POA: Insufficient documentation

## 2024-01-16 DIAGNOSIS — C50412 Malignant neoplasm of upper-outer quadrant of left female breast: Secondary | ICD-10-CM | POA: Diagnosis not present

## 2024-01-16 DIAGNOSIS — M6281 Muscle weakness (generalized): Secondary | ICD-10-CM | POA: Diagnosis not present

## 2024-01-16 DIAGNOSIS — E039 Hypothyroidism, unspecified: Secondary | ICD-10-CM | POA: Diagnosis not present

## 2024-01-16 DIAGNOSIS — R2689 Other abnormalities of gait and mobility: Secondary | ICD-10-CM | POA: Diagnosis not present

## 2024-01-16 DIAGNOSIS — S72141D Displaced intertrochanteric fracture of right femur, subsequent encounter for closed fracture with routine healing: Secondary | ICD-10-CM | POA: Diagnosis not present

## 2024-01-16 DIAGNOSIS — Z743 Need for continuous supervision: Secondary | ICD-10-CM | POA: Diagnosis not present

## 2024-01-16 DIAGNOSIS — S72001D Fracture of unspecified part of neck of right femur, subsequent encounter for closed fracture with routine healing: Secondary | ICD-10-CM | POA: Diagnosis not present

## 2024-01-16 DIAGNOSIS — M810 Age-related osteoporosis without current pathological fracture: Secondary | ICD-10-CM | POA: Diagnosis not present

## 2024-01-16 DIAGNOSIS — S72001S Fracture of unspecified part of neck of right femur, sequela: Secondary | ICD-10-CM | POA: Diagnosis not present

## 2024-01-16 DIAGNOSIS — E785 Hyperlipidemia, unspecified: Secondary | ICD-10-CM | POA: Diagnosis not present

## 2024-01-16 DIAGNOSIS — S72009S Fracture of unspecified part of neck of unspecified femur, sequela: Secondary | ICD-10-CM | POA: Insufficient documentation

## 2024-01-16 LAB — BASIC METABOLIC PANEL WITH GFR
Anion gap: 14 (ref 5–15)
BUN: 32 mg/dL — ABNORMAL HIGH (ref 8–23)
CO2: 22 mmol/L (ref 22–32)
Calcium: 8.9 mg/dL (ref 8.9–10.3)
Chloride: 103 mmol/L (ref 98–111)
Creatinine, Ser: 1.14 mg/dL — ABNORMAL HIGH (ref 0.44–1.00)
GFR, Estimated: 47 mL/min — ABNORMAL LOW (ref >60)
Glucose, Bld: 154 mg/dL — ABNORMAL HIGH (ref 70–99)
Potassium: 4.1 mmol/L (ref 3.5–5.1)
Sodium: 139 mmol/L (ref 135–145)

## 2024-01-16 LAB — CBC WITH DIFFERENTIAL/PLATELET
Abs Immature Granulocytes: 0.07 K/uL (ref 0.00–0.07)
Basophils Absolute: 0 K/uL (ref 0.0–0.1)
Basophils Relative: 0 %
Eosinophils Absolute: 0 K/uL (ref 0.0–0.5)
Eosinophils Relative: 0 %
HCT: 28.1 % — ABNORMAL LOW (ref 36.0–46.0)
Hemoglobin: 9.2 g/dL — ABNORMAL LOW (ref 12.0–15.0)
Immature Granulocytes: 1 %
Lymphocytes Relative: 7 %
Lymphs Abs: 0.8 K/uL (ref 0.7–4.0)
MCH: 32.6 pg (ref 26.0–34.0)
MCHC: 32.7 g/dL (ref 30.0–36.0)
MCV: 99.6 fL (ref 80.0–100.0)
Monocytes Absolute: 1.1 K/uL — ABNORMAL HIGH (ref 0.1–1.0)
Monocytes Relative: 10 %
Neutro Abs: 8.9 K/uL — ABNORMAL HIGH (ref 1.7–7.7)
Neutrophils Relative %: 82 %
Platelets: 95 K/uL — ABNORMAL LOW (ref 150–400)
RBC: 2.82 MIL/uL — ABNORMAL LOW (ref 3.87–5.11)
RDW: 14.8 % (ref 11.5–15.5)
WBC: 10.9 K/uL — ABNORMAL HIGH (ref 4.0–10.5)
nRBC: 0 % (ref 0.0–0.2)

## 2024-01-16 MED ORDER — POLYETHYLENE GLYCOL 3350 17 G PO PACK: 17.0000 g | PACK | Freq: Every day | ORAL | Status: AC | PRN

## 2024-01-16 MED ORDER — ACETAMINOPHEN 325 MG PO TABS: 650.0000 mg | ORAL_TABLET | Freq: Four times a day (QID) | ORAL | Status: AC | PRN

## 2024-01-16 MED ORDER — ACETAMINOPHEN 325 MG PO TABS
650.0000 mg | ORAL_TABLET | Freq: Four times a day (QID) | ORAL | Status: DC | PRN
  Administered 2024-01-16: 650 mg via ORAL
  Filled 2024-01-16: qty 2

## 2024-01-16 MED ORDER — ALPRAZOLAM 0.5 MG PO TABS: ORAL_TABLET | ORAL | 0 refills | Status: DC

## 2024-01-16 MED ORDER — CYANOCOBALAMIN 1000 MCG PO TABS: 1000.0000 ug | ORAL_TABLET | Freq: Every day | ORAL | Status: AC

## 2024-01-16 MED ORDER — TRAMADOL HCL 50 MG PO TABS: 50.0000 mg | ORAL_TABLET | Freq: Four times a day (QID) | ORAL | 0 refills | Status: AC | PRN

## 2024-01-16 MED ORDER — DOCUSATE SODIUM 100 MG PO CAPS: 100.0000 mg | ORAL_CAPSULE | Freq: Two times a day (BID) | ORAL | Status: AC

## 2024-01-16 NOTE — TOC Progression Note (Signed)
 Transition of Care [facility]) - Progression Note    Patient Details  Name: [name] MRN: [id number] Date of Birth: 13-Nov-1935  [facility] of [facility] CM/SW Contact  Bridget Cordella Simmonds, LCSW Phone Number: 01/16/2024, 12:55 PM  Clinical Narrative:   Bed offers provided to pt and daughter Mliss, they will accept offer at [facility].  CSW confirmed with [facility] that they can receive pt today.    SNF auth request submitted in [place] and approved: [id number], 5 days: 9/12-9/16.  MD notified.     Expected Discharge Plan: Skilled Nursing Facility Barriers to Discharge: Continued Medical Work up, SNF Pending bed offer               Expected Discharge Plan and Services In-house Referral: Clinical Social Work   Post Acute Care Choice:  (TBD) Living arrangements for the past 2 months: Single Family Home                                       Social Drivers of Health (SDOH) Interventions SDOH Screenings   Food Insecurity: No Food Insecurity (01/14/2024)  Housing: Low Risk  (01/14/2024)  Transportation Needs: No Transportation Needs (01/14/2024)  Utilities: Not At Risk (01/14/2024)  Alcohol Screen: Low Risk  (11/10/2023)  Depression (PHQ2-9): Low Risk  (11/10/2023)  Financial Resource Strain: Low Risk  (11/10/2023)  Physical Activity: Insufficiently Active (11/10/2023)  Social Connections: Moderately Integrated (01/16/2024)  Stress: No Stress Concern Present (11/10/2023)  Tobacco Use: Medium Risk (01/14/2024)  Health Literacy: Adequate Health Literacy (11/10/2023)    Readmission Risk Interventions    06/27/2021    3:36 PM 06/15/2021    9:47 AM  Readmission Risk Prevention Plan  Transportation Screening Complete Complete  PCP or Specialist Appt within 3-5 Days  Complete  HRI or Home Care Consult  Complete  Social Work Consult for Recovery Care Planning/Counseling  Complete  Palliative Care Screening  Complete  Medication Review Oceanographer) Complete Complete  PCP or  Specialist appointment within 3-5 days of discharge Complete   HRI or Home Care Consult Complete   SW Recovery Care/Counseling Consult Complete   Palliative Care Screening Not Applicable   Skilled Nursing Facility Not Applicable

## 2024-01-16 NOTE — TOC Transition Note (Signed)
 Transition of Care [facility]) - Discharge Note   Patient Details  Name: [name] MRN: [id number] Date of Birth: 06/29/1935  [facility]) CM/SW Contact:  Bridget Cordella Simmonds, LCSW Phone Number: 01/16/2024, 1:59 PM   Clinical Narrative:   Pt discharging to [facility], room 215.  RN call report to [phone].  PTAR called [phone].     Final next level of care: Skilled Nursing Facility Barriers to Discharge: Barriers Resolved   Patient Goals and CMS Choice Patient states their goals for this hospitalization and ongoing recovery are:: get back to what I was able to do CMS Medicare.gov Compare Post Acute Care list provided to:: Patient Choice offered to / list presented to : Patient, Adult Children (daughter Mliss)      Discharge Placement              Patient chooses bed at: [facility] Patient to be transferred to facility by: ptar Name of family member notified: daughter Mliss in room Patient and family notified of of transfer: 01/16/24  Discharge Plan and Services Additional resources added to the After Visit Summary for   In-house Referral: Clinical Social Work   Post Acute Care Choice:  (TBD)                               Social Drivers of Health (SDOH) Interventions SDOH Screenings   Food Insecurity: No Food Insecurity (01/14/2024)  Housing: Low Risk  (01/14/2024)  Transportation Needs: No Transportation Needs (01/14/2024)  Utilities: Not At Risk (01/14/2024)  Alcohol Screen: Low Risk  (11/10/2023)  Depression (PHQ2-9): Low Risk  (11/10/2023)  Financial Resource Strain: Low Risk  (11/10/2023)  Physical Activity: Insufficiently Active (11/10/2023)  Social Connections: Moderately Integrated (01/16/2024)  Stress: No Stress Concern Present (11/10/2023)  Tobacco Use: Medium Risk (01/14/2024)  Health Literacy: Adequate Health Literacy (11/10/2023)     Readmission Risk Interventions    06/27/2021    3:36 PM 06/15/2021    9:47 AM  Readmission  Risk Prevention Plan  Transportation Screening Complete Complete  PCP or Specialist Appt within 3-5 Days  Complete  HRI or Home Care Consult  Complete  Social Work Consult for Recovery Care Planning/Counseling  Complete  Palliative Care Screening  Complete  Medication Review Oceanographer) Complete Complete  PCP or Specialist appointment within 3-5 days of discharge Complete   HRI or Home Care Consult Complete   SW Recovery Care/Counseling Consult Complete   Palliative Care Screening Not Applicable   Skilled Nursing Facility Not Applicable

## 2024-01-16 NOTE — Discharge Summary (Signed)
 Physician Discharge Summary   Patient: [name] MRN: [id number] DOB: [birth year]  Admit date:     01/13/2024  Discharge date: 01/16/24  Discharge Physician: Lebron JINNY Cage   PCP: Norleen Lynwood ORN, MD   Recommendations at discharge:   Follow-up with PCP Follow-up with orthopedics  Discharge Diagnoses: Principal Problem:   Hip fracture (HCC) Active Problems:   Malignant neoplasm of upper-outer quadrant of left breast in female, estrogen receptor positive (HCC)   Hypothyroidism   Gout   Essential hypertension   Atrial fibrillation, chronic (HCC)   Mixed hyperlipidemia   OSA (obstructive sleep apnea)    Hospital Course: [name] is a 88 y.o. female with history of CAD status post PCI, atrial fibrillation, hyperlipidemia, sleep apnea, gout, hypothyroidism, macrocytic anemia was brought to the ER after patient had a fall.  Patient states she was picking the mail when she slipped and fell.  Did not hit her head.  Did not lose consciousness. In the ER CT scan showed a right hip fracture.  Orthopedic on-call was consulted.  Labs show hemoglobin of 11.5 macrocytic picture.  Creatinine 0.9.  Platelets 103.  Patient admitted for right hip fracture.    Today, patient denies any new complaints.  Reports feeling better overall.  Eager to be discharged to SNF.  Daughter at bedside, all questions answered.   Assessment and Plan:  Right hip fracture Due to mechanical fall Orthopedics consulted, s/p intramedullary rod on 01/14/2024 Pain management with Tylenol  or tramadol  prn DVT PPx with Xarelto  as per orthopedics  Mild Leukocytosis  Mild rise in WBC, possibly likely reactive post up Denies any fever, asymptomatic Monitor   Hypokalemia Replaced as needed   CAD s/p PCI Denies any chest pain Continue beta-blockers, statins, Zetia    Paroxysmal A-fib Currently rate controlled Continue beta-blockers, Xarelto  Telemetry   Hypertension Continue Toprol  Currently holding  hydrochlorothiazide  in this elderly patient now with a hx of fall/hip fracture (plan to discontinue as BP has been well controlled while here in the hospital, sometimes soft/low bp) Follow up with PCP   Macrocytic anemia Vitamin B12 deficiency Vitamin B12 supplementation   Hypothyroidism Continue Synthroid    Chronic pain syndrome Continue gabapentin    Gout Continue allopurinol    History of asthma Takes as needed Symbicort    OSA CPAP at bedtime   History of anxiety Continue Lexapro    Obesity class II Lifestyle modification advised Reports significant weight loss after her husband passed 2 years ago      Pain control - [place]  Controlled Substance Reporting System database was reviewed. and patient was instructed, not to drive, operate heavy machinery, perform activities at heights, swimming or participation in water activities or provide baby-sitting services while on Pain, Sleep and Anxiety Medications; until their outpatient Physician has advised to do so again. Also recommended to not to take more than prescribed Pain, Sleep and Anxiety Medications.    Consultants: Orthopedics Procedures performed: Surgery as above Disposition: Skilled nursing facility Diet recommendation:  Cardiac diet    DISCHARGE MEDICATION: Allergies as of 01/16/2024       Reactions   Crestor  [rosuvastatin  Calcium ]    Severe liver function problems   Hydrocodone -acetaminophen  Anxiety, Itching   Lidocaine  Hcl Other (See Comments)   Novacaine:  Becomes very shaky   Lipitor [atorvastatin  Calcium ] Other (See Comments)   Elevated liver function    Procaine Other (See Comments)   shaking   Rosuvastatin  Other (See Comments)   Severe liver function problems   Theophylline Other (  See Comments)   Becomes very shaky skaking skaking   Vicodin [hydrocodone -acetaminophen ] Itching   Itching all over   Codeine Nausea Only, Anxiety, Other (See Comments)   Also complained of dizziness.  Has  same problems with oxycodone and hydrocodone  Visual Disturbance, Balance Difficulty, Dizzy Room Spinning; Swimming Balance, vision, nausea, dizzy, swimming, room spinning Balance, vision, nausea, dizzy, swimming, room spinning   Ephedrine Other (See Comments)   shaking   Other Other (See Comments)   Quinine causes nausea, dizzy   Oxycodone Other (See Comments)   Balance, vision, nausea, dizzy, swimming, room spinning   Oxycodone-acetaminophen  Nausea Only, Anxiety, Other (See Comments)   Visual Disturbance, Balance Difficulty, Dizzy Room Spinning; Swimming   Propoxyphene Anxiety, Nausea Only, Other (See Comments)   Visual Disturbance, Balance Difficulty, Dizzy Room Spinning; Swimming Balance, vision, nausea, dizzy, swimming, room spinning Balance, vision, nausea, dizzy, swimming, room spinning Other Reaction(s): climb walls   Quinine Derivatives Nausea Only   dizzy   Sulfa  Antibiotics Nausea Only   Also complained of dizziness   Sulfur  Other (See Comments)   Epinephrine  Other (See Comments)   Patient becomes shaky shaking shaking   Penicillins Rash   Pt reported all over body rash in 1958 Has patient had a PCN reaction causing immediate rash, facial/tongue/throat swelling, SOB or lightheadedness with hypotension:Yes Has patient had a PCN reaction causing severe rash involving mucus membranes or skin necrosis: No Has patient had a PCN reaction that required hospitalization: No Has patient had a PCN reaction occurring within the last 10 years:No   Quinine Other (See Comments)   Theophyllines Other (See Comments)   Patient becomes shaky        Medication List     PAUSE taking these medications    hydrochlorothiazide  25 MG tablet Wait to take this until your doctor or other care provider tells you to start again. Commonly known as: HYDRODIURIL  TAKE 2 TABLETS(50 MG) BY MOUTH DAILY   potassium chloride  SA 20 MEQ tablet Wait to take this until  your doctor or other care provider tells you to start again. Commonly known as: KLOR-CON  M TAKE 1 TABLET BY MOUTH DAILY       STOP taking these medications    doxycycline  100 MG capsule Commonly known as: VIBRAMYCIN    levocetirizine 5 MG tablet Commonly known as: XYZAL        TAKE these medications    acetaminophen  325 MG tablet Commonly known as: TYLENOL  Take 2 tablets (650 mg total) by mouth every 6 (six) hours as needed for mild pain (pain score 1-3), fever or headache.   Advair Diskus 500-50 MCG/ACT Aepb Generic drug: fluticasone -salmeterol Inhale 1 puff into the lungs daily as needed (wheezing/SOB).   albuterol  (2.5 MG/3ML) 0.083% nebulizer solution Commonly known as: PROVENTIL  Take 3 mLs (2.5 mg total) by nebulization every 6 (six) hours as needed for wheezing or shortness of breath.   albuterol  108 (90 Base) MCG/ACT inhaler Commonly known as: VENTOLIN  HFA Inhale 2 puffs into the lungs every 4 (four) hours as needed for wheezing or shortness of breath.   Allergy  Relief 180 MG tablet Generic drug: fexofenadine  TAKE 1 TABLET(180 MG) BY MOUTH DAILY   allopurinol  300 MG tablet Commonly known as: ZYLOPRIM  Take 300 mg by mouth daily.   ALPRAZolam  0.5 MG tablet Commonly known as: XANAX  TAKE 1 TO 2 TABLETS BY MOUTH TWICE DAILY AS NEEDED FOR ANXIETY OR SLEEP   Arnicare Gel Apply 1 application  topically daily as needed (soreness).   azelastine   0.1 % nasal spray Commonly known as: ASTELIN  Place 2 sprays into both nostrils daily. Use in each nostril as directed   bacitracin  ointment Apply 1 Application topically daily. With dressing changes What changed:  when to take this reasons to take this additional instructions   bisacodyl  10 MG suppository Commonly known as: DULCOLAX Place 1 suppository (10 mg total) rectally as needed for moderate constipation.   co-enzyme Q-10 50 MG capsule Take 100 mg by mouth daily.   colchicine 0.6 MG tablet Take 0.6 mg by  mouth daily as needed (flare  up).   cyanocobalamin  1000 MCG tablet Take 1 tablet (1,000 mcg total) by mouth daily. Start taking on: January 17, 2024   cycloSPORINE  0.05 % ophthalmic emulsion Commonly known as: RESTASIS  Apply 1 drop to eye 2 (two) times daily as needed (allergies).   docusate sodium  100 MG capsule Commonly known as: COLACE Take 1 capsule (100 mg total) by mouth 2 (two) times daily.   EPINEPHrine  0.3 mg/0.3 mL Soaj injection Commonly known as: EPI-PEN Inject 0.3 mg into the muscle as needed for anaphylaxis.   escitalopram  10 MG tablet Commonly known as: Lexapro  Take 1 tablet (10 mg total) by mouth daily.   ezetimibe  10 MG tablet Commonly known as: ZETIA  TAKE 1 TABLET BY MOUTH EVERY DAY. TAKE ON TUESDAY AND FRIDAY   fluticasone  50 MCG/ACT nasal spray Commonly known as: FLONASE  Place 1 spray into both nostrils daily as needed for allergies.   gabapentin  300 MG capsule Commonly known as: NEURONTIN  TAKE 1 CAPSULE(300 MG) BY MOUTH AT BEDTIME   ipratropium 0.03 % nasal spray Commonly known as: ATROVENT  Place 2 sprays into both nostrils every 12 (twelve) hours as needed for rhinitis.   Lasix  40 MG tablet Generic drug: furosemide  Take 40 mg by mouth daily.   levalbuterol  45 MCG/ACT inhaler Commonly known as: Xopenex  HFA Inhale 2 puffs into the lungs every 6 (six) hours as needed for wheezing or shortness of breath.   levothyroxine  75 MCG tablet Commonly known as: SYNTHROID  TAKE 1 TABLET(75 MCG) BY MOUTH DAILY BEFORE BREAKFAST   metoprolol  succinate 25 MG 24 hr tablet Commonly known as: TOPROL -XL TAKE 1/2 TABLET BY MOUTH EVERY DAY   mirtazapine  15 MG tablet Commonly known as: REMERON  Take 1 tablet (15 mg total) by mouth daily. Please call [phone] to schedule an office visit for more refills What changed:  when to take this additional instructions   montelukast  10 MG tablet Commonly known as: SINGULAIR  Take 1 tablet (10 mg total) by mouth at  bedtime.   nystatin cream Commonly known as: MYCOSTATIN Apply 1 Application topically as needed for dry skin.   ondansetron  4 MG tablet Commonly known as: ZOFRAN  TAKE 1 TABLET(4 MG) BY MOUTH EVERY 8 HOURS AS NEEDED FOR NAUSEA OR VOMITING   pantoprazole  40 MG tablet Commonly known as: PROTONIX  Take 1 tablet (40 mg total) by mouth daily. Please call [phone] to schedule an office visit for more refills   polyethylene glycol 17 g packet Commonly known as: MIRALAX  / GLYCOLAX  Take 17 g by mouth daily as needed for mild constipation.   Repatha  SureClick 140 MG/ML Soaj Generic drug: Evolocumab  Inject 140 mg into the skin every 14 (fourteen) days. What changed: when to take this   rivaroxaban  20 MG Tabs tablet Commonly known as: XARELTO  Take 20 mg by mouth daily with supper.   rosuvastatin  10 MG tablet Commonly known as: Crestor  Take 1 tablet (10 mg total) by mouth 2 (two) times a  week. What changed: additional instructions   Symbicort  80-4.5 MCG/ACT inhaler Generic drug: budesonide -formoterol  Inhale 2 puffs into the lungs daily as needed (wheezing/SOB).   traMADol  50 MG tablet Commonly known as: ULTRAM  Take 1 tablet (50 mg total) by mouth every 6 (six) hours as needed for up to 3 days for moderate pain (pain score 4-6).   triamcinolone  cream 0.1 % Commonly known as: KENALOG  Apply 1 Application topically 2 (two) times daily. What changed:  when to take this reasons to take this   valACYclovir  1000 MG tablet Commonly known as: VALTREX  Take 2,000 mg by mouth as needed (fever blisters).        Contact information for follow-up providers     Norleen Lynwood ORN, MD. Schedule an appointment as soon as possible for a visit in 1 week(s).   Specialties: Internal Medicine, Radiology Contact information: [address] [place] [address] [phone]              Contact information for after-discharge care     Destination     [facility] [place], [place] .    Service: Skilled Nursing Contact information: [address] [place]  [phone]                    Discharge Exam: Fredricka Weights   01/13/24 2050  Weight: 96.2 kg   General: NAD  Cardiovascular: S1, S2 present Respiratory: CTAB Abdomen: Soft, nontender, nondistended, bowel sounds present Musculoskeletal: No bilateral pedal edema noted, R hip dressing noted with minimal dried blood, intact Skin: Normal Psychiatry: Normal mood   Condition at discharge: stable  The results of significant diagnostics from this hospitalization (including imaging, microbiology, ancillary and laboratory) are listed below for reference.   Imaging Studies: DG FEMUR, MIN 2 VIEWS RIGHT Result Date: 01/14/2024 CLINICAL DATA:  Elective surgery EXAM: RIGHT FEMUR 2 VIEWS COMPARISON:  CT 01/13/2024 FINDINGS: Six low resolution intraoperative spot views of the right femur. Total fluoroscopy time was 51 seconds, fluoroscopic dose of 24.95 mGy. The images demonstrate intramedullary rod and screw fixation of right intertrochanteric fracture IMPRESSION: Intraoperative fluoroscopic assistance provided during surgical fixation of right intertrochanteric fracture. Electronically Signed   By: Luke Bun M.D.   On: 01/14/2024 16:28   DG C-Arm 1-60 Min-No Report Result Date: 01/14/2024 Fluoroscopy was utilized by the requesting physician.  No radiographic interpretation.   CT HEAD WO CONTRAST ( ) Result Date: 01/13/2024 CLINICAL DATA:  [place], head trauma, anticoagulated EXAM: CT HEAD WITHOUT CONTRAST TECHNIQUE: Contiguous axial images were obtained from the base of the skull through the vertex without intravenous contrast. RADIATION DOSE REDUCTION: This exam was performed according to the departmental dose-optimization program which includes automated exposure control, adjustment of the mA and/or kV according to patient size and/or use of iterative reconstruction technique.  COMPARISON:  10/15/2022, 01/17/2023 FINDINGS: Brain: Hypodensities in the periventricular white matter are again noted, consistent with chronic small vessel ischemic changes. No evidence of acute infarct or hemorrhage. Lateral ventricles and midline structures are unremarkable. No acute extra-axial fluid collections. No mass effect. Vascular: No hyperdense vessel or unexpected calcification. Skull: Normal. Negative for fracture or focal lesion. Sinuses/Orbits: No acute finding. 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 IMPRESSION: 1. Stable head CT, no acute intracranial process. Electronically Signed   By: Ozell Daring M.D.   On: 01/13/2024 21:38   CT Cervical Spine Wo Contrast Result Date: 01/13/2024 EXAM: CT CERVICAL SPINE WITHOUT CONTRAST 01/13/2024 09:31:00 PM  TECHNIQUE: CT of the cervical spine was performed without the administration of intravenous contrast. Multiplanar reformatted images are provided for review. Automated exposure control, iterative reconstruction, and/or weight based adjustment of the mA/kV was utilized to reduce the radiation dose to as low as reasonably achievable. COMPARISON: CT cervical spine 10/15/2022 and MRI cervical spine 01/17/2023. CLINICAL HISTORY: Neck trauma (Age >= 65y). Fall; Head trauma, minor, normal mental status (Age 23-64y); Hip trauma, fracture suspected, no prior imaging; Mechanical fall while trying to check mail. She fell to her right side on her hip. Did not hit her head, no LOC. Pt does take Xerelto. She does have shortening and rotation of the right leg. FINDINGS: CERVICAL SPINE: BONES AND ALIGNMENT: Chronic fracture through the base of the dens, not substantially changed compared to MRI 01/17/2023. No acute fracture. No evidence of traumatic malalignment. DEGENERATIVE CHANGES: Multilevel degenerative spondylosis and facet arthropathy. Disc space height loss is greatest at C6-C7 where there is ankylosis of the vertebral  bodies. No severe spinal canal narrowing. SOFT TISSUES: No prevertebral soft tissue swelling. IMPRESSION: 1. No acute fracture or traumatic malalignment. 2. Chronic fracture through the dens, not substantially changed compared to MRI 01/17/2023. Electronically signed by: [name] Gatlin MD 01/13/2024 09:38 PM EDT RP Workstation: [id number]   CT Hip Right Wo Contrast Result Date: 01/13/2024 CLINICAL DATA:  Trauma to the right hip. EXAM: CT OF THE RIGHT HIP WITHOUT CONTRAST TECHNIQUE: Multidetector CT imaging of the right hip was performed according to the standard protocol. Multiplanar CT image reconstructions were also generated. RADIATION DOSE REDUCTION: This exam was performed according to the departmental dose-optimization program which includes automated exposure control, adjustment of the mA and/or kV according to patient size and/or use of iterative reconstruction technique. COMPARISON:  None Available. FINDINGS: Bones/Joint/Cartilage There is a comminuted, mildly impacted, intertrochanteric fracture of the right femur with mild valgus angulation. No other acute fracture. The bones are osteopenic. No dislocation. No joint effusion. Ligaments Suboptimally assessed by CT. Muscles and Tendons No intramuscular fluid collection or hematoma. Soft tissues No acute findings. IMPRESSION: Comminuted, intertrochanteric fracture of the right femur. Electronically Signed   By: Vanetta Chou M.D.   On: 01/13/2024 21:38   DG Chest Port 1 View Result Date: 01/13/2024 EXAM: 1 VIEW XRAY OF THE CHEST 01/13/2024 08:51:00 PM COMPARISON: 05/06/2023 CLINICAL HISTORY: Pre-op chest exam FINDINGS: LUNGS AND PLEURA: Pulmonary vascular congestion. No focal pulmonary opacity. No pulmonary edema. No pleural effusion. No pneumothorax. HEART AND MEDIASTINUM: Stable cardiomegaly. Aortic atherosclerotic calcification. No acute abnormality of the cardiac and mediastinal silhouettes. BONES AND SOFT TISSUES: No acute osseous abnormality.  IMPRESSION: 1. Stable cardiomegaly. Pulmonary vascular congestion. Electronically signed by: [name] Gatlin MD 01/13/2024 08:58 PM EDT RP Workstation: [id number]    Microbiology: Results for orders placed or performed during the hospital encounter of 01/13/24  MRSA Next Gen by PCR, Nasal     Status: None   Collection Time: 01/14/24 12:00 PM   Specimen: Nasal Mucosa; Nasal Swab  Result Value Ref Range Status   MRSA by PCR Next Gen NOT DETECTED NOT DETECTED Final    Comment: (NOTE) The GeneXpert MRSA Assay (FDA approved for NASAL specimens only), is one component of a comprehensive MRSA colonization surveillance program. It is not intended to diagnose MRSA infection nor to guide or monitor treatment for MRSA infections. Test performance is not FDA approved in patients less than 49 years old. Performed at [facility], [address] [place], [place] [address]   Surgical pcr screen  Status: None   Collection Time: 01/14/24 12:00 PM   Specimen: Nasal Mucosa; Nasal Swab  Result Value Ref Range Status   MRSA, PCR NEGATIVE NEGATIVE Final   Staphylococcus aureus NEGATIVE NEGATIVE Final    Comment: (NOTE) The Xpert SA Assay (FDA approved for NASAL specimens in patients 61 years of age and older), is one component of a comprehensive surveillance program. It is not intended to diagnose infection nor to guide or monitor treatment. Performed at [facility], [address], [place], [place] [address]     Labs: CBC: Recent Labs  Lab 01/13/24 2012 01/14/24 0546 01/15/24 0553 01/16/24 0541  WBC 8.2 8.2 8.9 10.9*  NEUTROABS 6.5  --  7.3 8.9*  HGB 11.5* 11.2* 10.0* 9.2*  HCT 35.8* 34.2* 30.6* 28.1*  MCV 102.9* 100.3* 100.3* 99.6  PLT 103* 100* 92* 95*   Basic Metabolic Panel: Recent Labs  Lab 01/13/24 2012 01/14/24 0546 01/15/24 0553 01/16/24 0541  NA 141 140 137 139  K 3.7 3.3* 4.4 4.1  CL 109 106 101 103  CO2 20* 26 25 22   GLUCOSE 101* 125* 155* 154*  BUN  22 21 25* 32*  CREATININE 0.95 1.02* 1.26* 1.14*  CALCIUM  8.4* 8.8* 8.9 8.9  MG  --  1.8  --   --    Liver Function Tests: Recent Labs  Lab 01/13/24 2012  AST 24  ALT 10  ALKPHOS 56  BILITOT 1.1  PROT 5.1*  ALBUMIN 3.1*   CBG: No results for input(s): GLUCAP in the last 168 hours.  Discharge time spent: greater than 30 minutes.  Signed: Lebron JINNY Cage, MD [facility] 01/16/2024

## 2024-01-16 NOTE — Progress Notes (Signed)
 Pt has been confused during part of the shift. Ed notify staff that pt has been calling police. Has been trying to get off the bed, she did pull her PIV off. Mats placed on the floor for her safety, bed alarm on and call light within reach

## 2024-01-16 NOTE — Progress Notes (Signed)
 Physical Therapy Treatment Patient Details Name: [name] MRN: [id number] DOB: 21-Oct-1935 Today's Date: 01/16/2024   History of Present Illness Pt is 88 yo female who presents on 01/13/24 after fall getting mail sustaining R hip fx. Underwent ORIF 9/10.   PMH: OA, Lt breast Ca, CAD, afib, COPD, HTN, HLD, gout, sleep apnea, hypothyroidism, macrocytic anemia, incontinence, osteoporosis, neuropathy, plantar fascitis, fall with [facility] 2019    PT Comments  Pt greeted seated in recliner chair, pleasant and agreeable to PT session. She ambulated ~3ft within the room using RW with minA and a close chair follow. Pt took shuffling steps with heavy reliance on BUE support. She required moderate cues for sequencing and assist to maneuver RW. Pt quickly fatigued and declined to attempt further gait attempts. She engaged in LE exercises. Pt required assist to complete sitting marches on RLE. Reviewed HEP handout and encouraged frequent mobilization with staff assistance. Will continue to follow acutely and advance appropriately.      If plan is discharge home, recommend the following: A lot of help with walking and/or transfers;A lot of help with bathing/dressing/bathroom;Assistance with cooking/housework;Assist for transportation;Help with stairs or ramp for entrance   Can travel by private vehicle     No  Equipment Recommendations  None recommended by PT    Recommendations for Other Services       Precautions / Restrictions Precautions Precautions: Fall Recall of Precautions/Restrictions: Intact Restrictions Weight Bearing Restrictions Per Provider Order: Yes RLE Weight Bearing Per Provider Order: Weight bearing as tolerated     Mobility  Bed Mobility               General bed mobility comments: Not assessed. Pt greeted seated in recliner chair and returned there at end of session.    Transfers Overall transfer level: Needs assistance Equipment used: Rolling walker (2  wheels) Transfers: Sit to/from Stand Sit to Stand: Min assist, +2 physical assistance, Mod assist           General transfer comment: Pt stood from recliner chair. Cued pt to scoot fwd, assist provided with use of bed pad. Powered up with minA x2 and modA x1. Increased time to bring BUE support from arm rests onto RW and elevate trunk. Good eccentric control.    Ambulation/Gait Ambulation/Gait assistance: Min assist, +2 safety/equipment (chair follow) Gait Distance (Feet): 5 Feet Assistive device: Rolling walker (2 wheels) Gait Pattern/deviations: Shuffle, Antalgic, Narrow base of support Gait velocity: decreased     General Gait Details: Pt with heavy reliance on BUE support on RW to offload LEs. She was unable to clear the floor on either foot, dragging her foot across the ground to advance. Pt led with RLE following with LLE. NBOS. Unable to correct with VC/TC. Quickly fatigued.   Stairs             Wheelchair Mobility     Tilt Bed    Modified Rankin (Stroke Patients Only)       Balance Overall balance assessment: Needs assistance Sitting-balance support: Bilateral upper extremity supported, Feet supported Sitting balance-Leahy Scale: Fair     Standing balance support: Bilateral upper extremity supported, During functional activity, Reliant on assistive device for balance Standing balance-Leahy Scale: Poor                              Communication Communication Communication: No apparent difficulties  Cognition Arousal: Alert Behavior During Therapy: WFL for tasks assessed/performed  PT - Cognitive impairments: Orientation, Memory   Orientation impairments: Place, Time (Pt required options to identifiy seh was in a hospital. She correctly reported city and state. Pt thought the date was Aug. 18, 2025.)                   PT - Cognition Comments: Pt with slight confusion, but pleasant and able to be redirected. Following commands:  Intact      Cueing Cueing Techniques: Verbal cues, Visual cues  Exercises Total Joint Exercises Long Arc Quad: Seated, Both, AROM, 10 reps Knee Flexion: Seated, Both, AROM, AAROM, 10 reps (Assisted RLE) Standing Hip Extension: Standing, Both, 10 reps, AROM    General Comments General comments (skin integrity, edema, etc.): VSS on RA      Pertinent Vitals/Pain Pain Assessment Pain Assessment: Faces Faces Pain Scale: Hurts little more Pain Location: R hip Pain Descriptors / Indicators: Discomfort, Aching, Grimacing, Sore Pain Intervention(s): Monitored during session, Limited activity within patient's tolerance, Repositioned    Home Living Family/patient expects to be discharged to:: Private residence Living Arrangements: Alone                      Prior Function            PT Goals (current goals can now be found in the care plan section) Acute Rehab PT Goals Patient Stated Goal: Return Home Progress towards PT goals: Progressing toward goals    Frequency    Min 2X/week      PT Plan      Co-evaluation              AM-PAC PT 6 Clicks Mobility   Outcome Measure  Help needed turning from your back to your side while in a flat bed without using bedrails?: A Lot Help needed moving from lying on your back to sitting on the side of a flat bed without using bedrails?: Total Help needed moving to and from a bed to a chair (including a wheelchair)?: A Lot Help needed standing up from a chair using your arms (e.g., wheelchair or bedside chair)?: A Lot Help needed to walk in hospital room?: Total Help needed climbing 3-5 steps with a railing? : Total 6 Click Score: 9    End of Session         PT Visit Diagnosis: Muscle weakness (generalized) (M62.81);Pain;Difficulty in walking, not elsewhere classified (R26.2) Pain - Right/Left: Right Pain - part of body: Hip     Time: 8955-8888 PT Time Calculation (min) (ACUTE ONLY): 27 min  Charges:     $Therapeutic Exercise: 8-22 mins $Therapeutic Activity: 8-22 mins PT General Charges $$ ACUTE PT VISIT: 1 Visit                     [name], PT, DPT Acute Rehabilitation Services Office: [phone] Secure Chat Preferred  [name] 01/16/2024, 12:40 PM

## 2024-01-16 NOTE — Care Management Important Message (Signed)
 Important Message  Patient Details  Name: [name] MRN: [id number] Date of Birth: 09/01/1935   Important Message Given:  Yes - Medicare IM     Jon Cruel 01/16/2024, 1:13 PM

## 2024-01-16 NOTE — Progress Notes (Signed)
 Patient ID: [name], female   DOB: [birth year], 88 y.o.   MRN: [id number]   LOS: 3 days   Subjective: Doing wonderfully. Set to go to SNF in a couple of hours.   Objective: Vital signs in last 24 hours: Temp:  [97.5 F (36.4 C)-99 F (37.2 C)] 99 F (37.2 C) (09/12 0858) Pulse Rate:  [88-104] 104 (09/12 0858) Resp:  [15-18] 16 (09/12 0858) BP: (105-134)/(64-93) 105/64 (09/12 0858) SpO2:  [94 %-97 %] 97 % (09/12 0858) Last BM Date : 01/14/24   Laboratory  CBC Recent Labs    01/15/24 0553 01/16/24 0541  WBC 8.9 10.9*  HGB 10.0* 9.2*  HCT 30.6* 28.1*  PLT 92* 95*   BMET Recent Labs    01/15/24 0553 01/16/24 0541  NA 137 139  K 4.4 4.1  CL 101 103  CO2 25 22  GLUCOSE 155* 154*  BUN 25* 32*  CREATININE 1.26* 1.14*  CALCIUM  8.9 8.9     Physical Exam General appearance: alert and no distress RLE -- Dressings clean and intact. 2+ DP. Compartments soft.   Assessment/Plan: S/p IMN -- Ok for d/c to SNF. F/u with Dr. Reyne in 2 weeks.    Mercedes DOROTHA Ned, PA-C Orthopedic Surgery [phone] 01/16/2024

## 2024-01-19 DIAGNOSIS — S72001A Fracture of unspecified part of neck of right femur, initial encounter for closed fracture: Secondary | ICD-10-CM | POA: Diagnosis not present

## 2024-01-19 DIAGNOSIS — K219 Gastro-esophageal reflux disease without esophagitis: Secondary | ICD-10-CM | POA: Diagnosis not present

## 2024-01-19 DIAGNOSIS — E785 Hyperlipidemia, unspecified: Secondary | ICD-10-CM | POA: Diagnosis not present

## 2024-01-19 DIAGNOSIS — E039 Hypothyroidism, unspecified: Secondary | ICD-10-CM | POA: Diagnosis not present

## 2024-01-20 DIAGNOSIS — I10 Essential (primary) hypertension: Secondary | ICD-10-CM | POA: Diagnosis not present

## 2024-01-20 DIAGNOSIS — I482 Chronic atrial fibrillation, unspecified: Secondary | ICD-10-CM | POA: Diagnosis not present

## 2024-01-20 DIAGNOSIS — S72001A Fracture of unspecified part of neck of right femur, initial encounter for closed fracture: Secondary | ICD-10-CM | POA: Diagnosis not present

## 2024-01-20 DIAGNOSIS — I2510 Atherosclerotic heart disease of native coronary artery without angina pectoris: Secondary | ICD-10-CM | POA: Diagnosis not present

## 2024-01-20 DIAGNOSIS — E039 Hypothyroidism, unspecified: Secondary | ICD-10-CM | POA: Diagnosis not present

## 2024-01-21 DIAGNOSIS — Z4789 Encounter for other orthopedic aftercare: Secondary | ICD-10-CM | POA: Diagnosis not present

## 2024-01-21 DIAGNOSIS — I482 Chronic atrial fibrillation, unspecified: Secondary | ICD-10-CM | POA: Diagnosis not present

## 2024-01-21 DIAGNOSIS — M6281 Muscle weakness (generalized): Secondary | ICD-10-CM | POA: Diagnosis not present

## 2024-01-21 DIAGNOSIS — Z9181 History of falling: Secondary | ICD-10-CM | POA: Diagnosis not present

## 2024-01-21 DIAGNOSIS — S72001D Fracture of unspecified part of neck of right femur, subsequent encounter for closed fracture with routine healing: Secondary | ICD-10-CM | POA: Diagnosis not present

## 2024-01-21 DIAGNOSIS — J449 Chronic obstructive pulmonary disease, unspecified: Secondary | ICD-10-CM | POA: Diagnosis not present

## 2024-01-23 DIAGNOSIS — N390 Urinary tract infection, site not specified: Secondary | ICD-10-CM | POA: Diagnosis not present

## 2024-01-27 DIAGNOSIS — S72141D Displaced intertrochanteric fracture of right femur, subsequent encounter for closed fracture with routine healing: Secondary | ICD-10-CM | POA: Diagnosis not present

## 2024-01-28 DIAGNOSIS — S72001D Fracture of unspecified part of neck of right femur, subsequent encounter for closed fracture with routine healing: Secondary | ICD-10-CM | POA: Diagnosis not present

## 2024-01-28 DIAGNOSIS — J449 Chronic obstructive pulmonary disease, unspecified: Secondary | ICD-10-CM | POA: Diagnosis not present

## 2024-01-28 DIAGNOSIS — Z4789 Encounter for other orthopedic aftercare: Secondary | ICD-10-CM | POA: Diagnosis not present

## 2024-01-28 DIAGNOSIS — M6281 Muscle weakness (generalized): Secondary | ICD-10-CM | POA: Diagnosis not present

## 2024-01-28 DIAGNOSIS — Z9181 History of falling: Secondary | ICD-10-CM | POA: Diagnosis not present

## 2024-01-28 DIAGNOSIS — I482 Chronic atrial fibrillation, unspecified: Secondary | ICD-10-CM | POA: Diagnosis not present

## 2024-02-04 DIAGNOSIS — S72001D Fracture of unspecified part of neck of right femur, subsequent encounter for closed fracture with routine healing: Secondary | ICD-10-CM | POA: Diagnosis not present

## 2024-02-04 DIAGNOSIS — Z9181 History of falling: Secondary | ICD-10-CM | POA: Diagnosis not present

## 2024-02-04 DIAGNOSIS — J449 Chronic obstructive pulmonary disease, unspecified: Secondary | ICD-10-CM | POA: Diagnosis not present

## 2024-02-04 DIAGNOSIS — I482 Chronic atrial fibrillation, unspecified: Secondary | ICD-10-CM | POA: Diagnosis not present

## 2024-02-04 DIAGNOSIS — M6281 Muscle weakness (generalized): Secondary | ICD-10-CM | POA: Diagnosis not present

## 2024-02-04 DIAGNOSIS — Z4789 Encounter for other orthopedic aftercare: Secondary | ICD-10-CM | POA: Diagnosis not present

## 2024-02-05 DIAGNOSIS — I2510 Atherosclerotic heart disease of native coronary artery without angina pectoris: Secondary | ICD-10-CM | POA: Diagnosis not present

## 2024-02-05 DIAGNOSIS — I10 Essential (primary) hypertension: Secondary | ICD-10-CM | POA: Diagnosis not present

## 2024-02-05 DIAGNOSIS — S72001A Fracture of unspecified part of neck of right femur, initial encounter for closed fracture: Secondary | ICD-10-CM | POA: Diagnosis not present

## 2024-02-09 ENCOUNTER — Telehealth: Payer: Self-pay

## 2024-02-09 NOTE — Transitions of Care (Post Inpatient/ED Visit) (Signed)
   02/09/2024  Name: [name] MRN: [id number] DOB: Jul 03, 1935  Today's TOC FU Call Status: Today's TOC FU Call Status:: Unsuccessful Call (1st Attempt) Unsuccessful Call (1st Attempt) Date: 02/09/24  Attempted to reach the patient regarding the most recent Inpatient/ED visit.  Follow Up Plan: Additional outreach attempts will be made to reach the patient to complete the Transitions of Care (Post Inpatient/ED visit) call.   Signature Avelina Essex, CMA (AAMA)  [facility] [phone]

## 2024-02-12 ENCOUNTER — Telehealth: Payer: Self-pay

## 2024-02-12 NOTE — Telephone Encounter (Signed)
 Copied from CRM [id number]. Topic: General - Other >> Feb 12, 2024  2:02 PM Deaijah H wrote: Reason for CRM: Cara clinical manager [facility] would like to let Dr. Norleen know there's been an delay and patient will be seen 10/13

## 2024-02-13 ENCOUNTER — Encounter: Payer: Self-pay | Admitting: Family Medicine

## 2024-02-16 NOTE — Telephone Encounter (Unsigned)
 Copied from CRM #[id number]. Topic: General - Other >> Feb 13, 2024  4:48 PM Joesph B wrote: Reason for CRM: patient called to let us  know [facility] is trying to contact the clinic, looks like they have contacted the office 02/12/24

## 2024-02-16 NOTE — Telephone Encounter (Signed)
 The message was received.

## 2024-02-17 ENCOUNTER — Encounter: Payer: Self-pay | Admitting: Gastroenterology

## 2024-02-18 ENCOUNTER — Telehealth: Payer: Self-pay

## 2024-02-18 NOTE — Telephone Encounter (Signed)
 Copied from CRM [id number]. Topic: Clinical - Home Health Verbal Orders >> Feb 18, 2024  9:18 AM Precious C wrote: Caller/Agency: Erin/[facility] Callback Number: [phone] (private Line, requesting to Please LVM) Service Requested: Physical Therapy Frequency: 2x2 & 1x7 Any new concerns about the patient? No

## 2024-02-19 NOTE — Telephone Encounter (Signed)
 Ok for [employer]

## 2024-02-20 NOTE — Telephone Encounter (Signed)
 Called & gave verbals.

## 2024-03-15 ENCOUNTER — Ambulatory Visit: Admitting: Internal Medicine

## 2024-03-15 ENCOUNTER — Ambulatory Visit: Payer: Self-pay | Admitting: Internal Medicine

## 2024-03-15 ENCOUNTER — Encounter: Payer: Self-pay | Admitting: Internal Medicine

## 2024-03-15 ENCOUNTER — Other Ambulatory Visit: Payer: Self-pay | Admitting: Internal Medicine

## 2024-03-15 VITALS — BP 136/78 | HR 70 | Temp 98.4°F | Ht 65.0 in | Wt 216.0 lb

## 2024-03-15 DIAGNOSIS — E782 Mixed hyperlipidemia: Secondary | ICD-10-CM

## 2024-03-15 DIAGNOSIS — R7303 Prediabetes: Secondary | ICD-10-CM | POA: Diagnosis not present

## 2024-03-15 DIAGNOSIS — D649 Anemia, unspecified: Secondary | ICD-10-CM | POA: Diagnosis not present

## 2024-03-15 DIAGNOSIS — Z23 Encounter for immunization: Secondary | ICD-10-CM | POA: Diagnosis not present

## 2024-03-15 DIAGNOSIS — S72009S Fracture of unspecified part of neck of unspecified femur, sequela: Secondary | ICD-10-CM | POA: Diagnosis not present

## 2024-03-15 DIAGNOSIS — E538 Deficiency of other specified B group vitamins: Secondary | ICD-10-CM | POA: Diagnosis not present

## 2024-03-15 DIAGNOSIS — D509 Iron deficiency anemia, unspecified: Secondary | ICD-10-CM

## 2024-03-15 DIAGNOSIS — J301 Allergic rhinitis due to pollen: Secondary | ICD-10-CM

## 2024-03-15 DIAGNOSIS — E559 Vitamin D deficiency, unspecified: Secondary | ICD-10-CM | POA: Diagnosis not present

## 2024-03-15 DIAGNOSIS — I10 Essential (primary) hypertension: Secondary | ICD-10-CM

## 2024-03-15 DIAGNOSIS — Z0001 Encounter for general adult medical examination with abnormal findings: Secondary | ICD-10-CM | POA: Diagnosis not present

## 2024-03-15 DIAGNOSIS — T63461A Toxic effect of venom of wasps, accidental (unintentional), initial encounter: Secondary | ICD-10-CM | POA: Insufficient documentation

## 2024-03-15 LAB — CBC WITH DIFFERENTIAL/PLATELET
Basophils Absolute: 0 K/uL (ref 0.0–0.1)
Basophils Relative: 0.5 % (ref 0.0–3.0)
Eosinophils Absolute: 0.2 K/uL (ref 0.0–0.7)
Eosinophils Relative: 4.3 % (ref 0.0–5.0)
HCT: 35.1 % — ABNORMAL LOW (ref 36.0–46.0)
Hemoglobin: 11.5 g/dL — ABNORMAL LOW (ref 12.0–15.0)
Lymphocytes Relative: 25.4 % (ref 12.0–46.0)
Lymphs Abs: 1.2 K/uL (ref 0.7–4.0)
MCHC: 32.8 g/dL (ref 30.0–36.0)
MCV: 97.8 fl (ref 78.0–100.0)
Monocytes Absolute: 0.6 K/uL (ref 0.1–1.0)
Monocytes Relative: 13.6 % — ABNORMAL HIGH (ref 3.0–12.0)
Neutro Abs: 2.6 K/uL (ref 1.4–7.7)
Neutrophils Relative %: 56.2 % (ref 43.0–77.0)
Platelets: 147 K/uL — ABNORMAL LOW (ref 150.0–400.0)
RBC: 3.59 Mil/uL — ABNORMAL LOW (ref 3.87–5.11)
RDW: 16 % — ABNORMAL HIGH (ref 11.5–15.5)
WBC: 4.6 K/uL (ref 4.0–10.5)

## 2024-03-15 LAB — HEPATIC FUNCTION PANEL
ALT: 8 U/L (ref 0–35)
AST: 15 U/L (ref 0–37)
Albumin: 4.2 g/dL (ref 3.5–5.2)
Alkaline Phosphatase: 126 U/L — ABNORMAL HIGH (ref 39–117)
Bilirubin, Direct: 0.1 mg/dL (ref 0.0–0.3)
Total Bilirubin: 0.6 mg/dL (ref 0.2–1.2)
Total Protein: 6.6 g/dL (ref 6.0–8.3)

## 2024-03-15 LAB — LIPID PANEL
Cholesterol: 151 mg/dL (ref 0–200)
HDL: 59.9 mg/dL (ref >39.00)
LDL Cholesterol: 73 mg/dL (ref 0–99)
NonHDL: 90.75
Total CHOL/HDL Ratio: 3
Triglycerides: 91 mg/dL (ref 0.0–149.0)
VLDL: 18.2 mg/dL (ref 0.0–40.0)

## 2024-03-15 LAB — IBC PANEL
Iron: 36 ug/dL — ABNORMAL LOW (ref 42–145)
Saturation Ratios: 10.5 % — ABNORMAL LOW (ref 20.0–50.0)
TIBC: 344.4 ug/dL (ref 250.0–450.0)
Transferrin: 246 mg/dL (ref 212.0–360.0)

## 2024-03-15 LAB — URINALYSIS, ROUTINE W REFLEX MICROSCOPIC
Ketones, ur: NEGATIVE
Nitrite: POSITIVE — AB
Specific Gravity, Urine: 1.025 (ref 1.000–1.030)
Total Protein, Urine: NEGATIVE
Urine Glucose: NEGATIVE
Urobilinogen, UA: 0.2 (ref 0.0–1.0)
pH: 6 (ref 5.0–8.0)

## 2024-03-15 LAB — HEMOGLOBIN A1C: Hgb A1c MFr Bld: 5 % (ref 4.6–6.5)

## 2024-03-15 LAB — BASIC METABOLIC PANEL WITH GFR
BUN: 19 mg/dL (ref 6–23)
CO2: 28 meq/L (ref 19–32)
Calcium: 9.5 mg/dL (ref 8.4–10.5)
Chloride: 107 meq/L (ref 96–112)
Creatinine, Ser: 0.89 mg/dL (ref 0.40–1.20)
GFR: 58.65 mL/min — ABNORMAL LOW (ref >60.00)
Glucose, Bld: 87 mg/dL (ref 70–99)
Potassium: 3.5 meq/L (ref 3.5–5.1)
Sodium: 143 meq/L (ref 135–145)

## 2024-03-15 LAB — TSH: TSH: 3.58 u[IU]/mL (ref 0.35–5.50)

## 2024-03-15 LAB — VITAMIN B12: Vitamin B-12: 357 pg/mL (ref 211–911)

## 2024-03-15 LAB — FERRITIN: Ferritin: 37.1 ng/mL (ref 10.0–291.0)

## 2024-03-15 LAB — VITAMIN D 25 HYDROXY (VIT D DEFICIENCY, FRACTURES): VITD: 50.75 ng/mL (ref 30.00–100.00)

## 2024-03-15 MED ORDER — AZELASTINE HCL 0.1 % NA SOLN: 2.0000 | Freq: Every day | NASAL | 5 refills | Status: AC

## 2024-03-15 MED ORDER — POLYSACCHARIDE IRON COMPLEX 150 MG PO CAPS: 150.0000 mg | ORAL_CAPSULE | Freq: Every day | ORAL | 0 refills | Status: AC

## 2024-03-15 NOTE — Progress Notes (Unsigned)
 Patient ID: [name], female   DOB: 03-24-1936, 88 y.o.   MRN: [id number]         Chief Complaint:: wellness exam and hx of right hip fx, allergies, anemia post op, low vit d, hld, htn, preDM       HPI:  [name] is a 88 y.o. female here for wellness exam; missed oct 13 mammogram but pt will call to reschedule; for flu shot today, o/w up to date                        Also s/p recent fall sept 9 with right hip fracture for several days, then [facility] x 2 mo, now home with walker.  Overall doing ok, but has recurring pain post op moderate not always controlled with tylenol , has some tramadol  to try at night.  Pt denies chest pain, increased sob or doe, wheezing, orthopnea, PND, increased LE swelling, palpitations, dizziness or syncope.   Pt denies polydipsia, polyuria, or new focal neuro s/s.    Pt denies fever, wt loss, night sweats, loss of appetite, or other constitutional symptoms  No overt bleeding.  Does have several wks ongoing nasal allergy  symptoms with clearish congestion, itch and sneezing, without fever, pain, ST, cough, swelling or wheezing.  Does also mention she had a chronic cervical fx  through the dens after a fall x 2 yrs ago.      Wt Readings from Last 3 Encounters:  03/15/24 216 lb (98 kg)  01/13/24 212 lb (96.2 kg)  11/10/23 207 lb (93.9 kg)   BP Readings from Last 3 Encounters:  03/15/24 136/78  01/16/24 105/64  12/25/23 118/79   Immunization History  Administered Date(s) Administered   Fluad Quad(high Dose 65+) 03/30/2019, 01/21/2020, 04/24/2021, 02/12/2022   Fluad Trivalent(High Dose 65+) 04/18/2023   INFLUENZA, HIGH DOSE SEASONAL PF 03/20/2017, 01/19/2018, 03/15/2024   Influenza Split 04/06/2009, 04/05/2010, 01/10/2011, 02/10/2012, 04/23/2013   Influenza-Unspecified 05/08/2015, 01/23/2016   PFIZER(Purple Top)SARS-COV-2 Vaccination 06/11/2019, 07/06/2019, 12/29/2019, 02/28/2020, 12/18/2020   Pneumococcal Conjugate-13 06/01/2013, 03/20/2017    Pneumococcal Polysaccharide-23 03/16/2003, 01/23/2016, 12/18/2020   Td 12/08/2009   Tdap 04/19/2022, 03/15/2023   Zoster Recombinant(Shingrix) 06/07/2015, 03/18/2016   Zoster, Live 06/07/2015   Health Maintenance Due  Topic Date Due   COVID-19 Vaccine (6 - 2025-26 season) 01/05/2024   Mammogram  02/28/2024      Past Medical History:  Diagnosis Date   Allergy     Anxiety    Arthritis    no cartlidge in knees   Asthma    Bradycardia    a. atenolol  stopped due to this, HR 40s.   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  CAD s/p RCA stent 2000 with residual mild-mod disease of left system 2001.   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   Osteoporosis 03/24/2015   Peripheral neuropathy 03/24/2015   Plantar fasciitis    Sleep apnea    Past Surgical History:  Procedure Laterality Date   BREAST LUMPECTOMY W/ NEEDLE LOCALIZATION  01/29/2011   Left with  SLN Dr Merrilyn   CHOLECYSTECTOMY  1955   CORONARY ANGIOPLASTY WITH STENT PLACEMENT  2000   DILATION AND CURETTAGE OF UTERUS  1976   and 1996   HAND SURGERY  1992   tendons between thumb and forefinger   INTRAMEDULLARY (IM) NAIL INTERTROCHANTERIC Right 01/14/2024   Procedure: FIXATION, FRACTURE, INTERTROCHANTERIC, WITH INTRAMEDULLARY ROD;  Surgeon: Reyne Cordella SQUIBB, MD;  Location: [facility] OR;  Service: Orthopedics;  Laterality: Right;  Right Intertroch Nail   SKIN BIOPSY     1994, 2006, 2008, 2010, 2011, 2011, 2012-  pre-cancerous   TONSILLECTOMY  1955    reports that she quit smoking about 61 years ago. Her smoking use included cigarettes. She started smoking about 65  years ago. She has a 4 pack-year smoking history. She has never used smokeless tobacco. She reports that she does not drink alcohol and does not use drugs. family history includes Asthma in her sister; Heart attack in her brother and father; Heart disease in her brother and father; Rectal cancer in her father. Allergies  Allergen Reactions   Crestor  [Rosuvastatin  Calcium ]     Severe liver function problems   Hydrocodone -Acetaminophen  Anxiety and Itching   Lidocaine  Hcl Other (See Comments)    Novacaine:  Becomes very shaky   Lipitor [Atorvastatin  Calcium ] Other (See Comments)    Elevated liver function    Procaine Other (See Comments)    shaking   Rosuvastatin  Other (See Comments)    Severe liver function problems   Theophylline Other (See Comments)    Becomes very shaky skaking skaking   Vicodin [Hydrocodone -Acetaminophen ] Itching    Itching all over   Codeine Nausea Only, Anxiety and Other (See Comments)    Also complained of dizziness.  Has same problems with oxycodone and hydrocodone  Visual Disturbance, Balance Difficulty, Dizzy Room Spinning; Swimming Balance, vision, nausea, dizzy, swimming, room spinning Balance, vision, nausea, dizzy, swimming, room spinning   Ephedrine Other (See Comments)    shaking   Other Other (See Comments)    Quinine causes nausea, dizzy   Oxycodone Other (See Comments)    Balance, vision, nausea, dizzy, swimming, room spinning   Oxycodone-Acetaminophen  Nausea Only, Anxiety and Other (See Comments)    Visual Disturbance, Balance Difficulty, Dizzy Room Spinning; Swimming   Propoxyphene Anxiety, Nausea Only and Other (See Comments)    Visual Disturbance, Balance Difficulty, Dizzy Room Spinning; Swimming  Balance, vision, nausea, dizzy, swimming, room spinning  Balance, vision, nausea, dizzy, swimming, room spinning  Other Reaction(s): climb walls   Quinine Derivatives Nausea Only    dizzy   Sulfa  Antibiotics Nausea  Only    Also complained of dizziness   Hydrocodone     Sulfur  Other (See Comments)   Epinephrine  Other (See Comments)    Patient becomes shaky shaking shaking   Penicillins Rash    Pt reported all over body rash in 1958 Has patient had a PCN reaction causing immediate rash, facial/tongue/throat swelling, SOB or lightheadedness with hypotension:Yes Has patient had a PCN reaction causing severe rash involving mucus membranes or skin necrosis: No Has patient had a PCN reaction that required hospitalization: No Has patient had a PCN reaction occurring within the last 10 years:No     Quinine Other (See Comments)   Theophyllines Other (See Comments)    Patient becomes shaky   Current Outpatient Medications on File Prior to Visit  Medication Sig Dispense Refill   magnesium hydroxide (MILK OF MAGNESIA) 400 MG/5ML suspension Take by mouth.     traMADol  (ULTRAM )  50 MG tablet Take by mouth.     acetaminophen  (TYLENOL ) 325 MG tablet Take 2 tablets (650 mg total) by mouth every 6 (six) hours as needed for mild pain (pain score 1-3), fever or headache.     albuterol  (PROVENTIL ) (2.5 MG/3ML) 0.083% nebulizer solution Take 3 mLs (2.5 mg total) by nebulization every 6 (six) hours as needed for wheezing or shortness of breath. 75 mL 12   albuterol  (VENTOLIN  HFA) 108 (90 Base) MCG/ACT inhaler Inhale 2 puffs into the lungs every 4 (four) hours as needed for wheezing or shortness of breath. 1 each 0   ALLERGY  RELIEF 180 MG tablet TAKE 1 TABLET(180 MG) BY MOUTH DAILY 90 tablet 5   allopurinol  (ZYLOPRIM ) 300 MG tablet Take 300 mg by mouth daily.     ALPRAZolam  (XANAX ) 0.5 MG tablet TAKE 1 TO 2 TABLETS BY MOUTH TWICE DAILY AS NEEDED FOR ANXIETY OR SLEEP 6 tablet 0   bacitracin  ointment Apply 1 Application topically daily. With dressing changes (Patient taking differently: Apply 1 Application topically daily as needed for wound care.) 120 g 0   Biotin 10 MG TABS 1 tablet Orally Once a day     bisacodyl   (DULCOLAX) 10 MG suppository Place 1 suppository (10 mg total) rectally as needed for moderate constipation. 12 suppository 0   budesonide -formoterol  (SYMBICORT ) 80-4.5 MCG/ACT inhaler Inhale 2 puffs into the lungs daily as needed (wheezing/SOB).     co-enzyme Q-10 50 MG capsule Take 100 mg by mouth daily.     colchicine 0.6 MG tablet Take 0.6 mg by mouth daily as needed (flare  up).     cyanocobalamin  1000 MCG tablet Take 1 tablet (1,000 mcg total) by mouth daily.     cycloSPORINE  (RESTASIS ) 0.05 % ophthalmic emulsion Apply 1 drop to eye 2 (two) times daily as needed (allergies).     docusate sodium  (COLACE) 100 MG capsule Take 1 capsule (100 mg total) by mouth 2 (two) times daily.     EPINEPHrine  0.3 mg/0.3 mL IJ SOAJ injection Inject 0.3 mg into the muscle as needed for anaphylaxis.     escitalopram  (LEXAPRO ) 10 MG tablet Take 1 tablet (10 mg total) by mouth daily. 90 tablet 3   Evolocumab  (REPATHA  SURECLICK) 140 MG/ML SOAJ Inject 140 mg into the skin every 14 (fourteen) days. (Patient taking differently: Inject 140 mg into the skin every 30 (thirty) days.) 6 mL 3   ezetimibe  (ZETIA ) 10 MG tablet TAKE 1 TABLET BY MOUTH EVERY DAY. TAKE ON TUESDAY AND FRIDAY 90 tablet 3   fluticasone  (FLONASE ) 50 MCG/ACT nasal spray Place 1 spray into both nostrils daily as needed for allergies.     fluticasone -salmeterol (ADVAIR DISKUS) 500-50 MCG/ACT AEPB Inhale 1 puff into the lungs daily as needed (wheezing/SOB).     furosemide  (LASIX ) 40 MG tablet Take 40 mg by mouth daily.     gabapentin  (NEURONTIN ) 300 MG capsule TAKE 1 CAPSULE(300 MG) BY MOUTH AT BEDTIME 90 capsule 1   Homeopathic Products (ARNICARE) GEL Apply 1 application  topically daily as needed (soreness).     [Paused] hydrochlorothiazide  (HYDRODIURIL ) 25 MG tablet TAKE 2 TABLETS(50 MG) BY MOUTH DAILY 180 tablet 1   ipratropium (ATROVENT ) 0.03 % nasal spray Place 2 sprays into both nostrils every 12 (twelve) hours as needed for rhinitis. 30 mL 11    levalbuterol  (XOPENEX  HFA) 45 MCG/ACT inhaler Inhale 2 puffs into the lungs every 6 (six) hours as needed for wheezing or shortness of breath. 3 Inhaler 3  levocetirizine (XYZAL ) 5 MG tablet 1 tablet Orally Once a day; Duration: 30 days     levothyroxine  (SYNTHROID ) 75 MCG tablet TAKE 1 TABLET(75 MCG) BY MOUTH DAILY BEFORE BREAKFAST 90 tablet 1   metoprolol  succinate (TOPROL -XL) 25 MG 24 hr tablet TAKE 1/2 TABLET BY MOUTH EVERY DAY 45 tablet 3   mirtazapine  (REMERON ) 15 MG tablet Take 1 tablet (15 mg total) by mouth daily. Please call [phone] to schedule an office visit for more refills (Patient taking differently: Take 15 mg by mouth at bedtime.) 90 tablet 0   montelukast  (SINGULAIR ) 10 MG tablet Take 1 tablet (10 mg total) by mouth at bedtime. 90 tablet 1   nystatin cream (MYCOSTATIN) Apply 1 Application topically as needed for dry skin.     Omeprazole Magnesium (PRILOSEC) 10 MG PACK 1 packet mixed with 15 ml of water Orally Once a day; Duration: 30 day(s)     ondansetron  (ZOFRAN ) 4 MG tablet TAKE 1 TABLET(4 MG) BY MOUTH EVERY 8 HOURS AS NEEDED FOR NAUSEA OR VOMITING 40 tablet 1   pantoprazole  (PROTONIX ) 40 MG tablet Take 1 tablet (40 mg total) by mouth daily. Please call [phone] to schedule an office visit for more refills 90 tablet 0   polyethylene glycol (MIRALAX  / GLYCOLAX ) 17 g packet Take 17 g by mouth daily as needed for mild constipation.     [Paused] potassium chloride  SA (KLOR-CON  M) 20 MEQ tablet TAKE 1 TABLET BY MOUTH DAILY 90 tablet 3   rivaroxaban  (XARELTO ) 20 MG TABS tablet Take 20 mg by mouth daily with supper.     rosuvastatin  (CRESTOR ) 10 MG tablet Take 1 tablet (10 mg total) by mouth 2 (two) times a week. (Patient taking differently: Take 10 mg by mouth 2 (two) times a week. Tuesdays and Thursdays) 25 tablet 3   triamcinolone  cream (KENALOG ) 0.1 % Apply 1 Application topically 2 (two) times daily. (Patient taking differently: Apply 1 Application topically as needed (skin  issues.).) 30 g 1   valACYclovir  (VALTREX ) 1000 MG tablet Take 2,000 mg by mouth as needed (fever blisters).     Zinc  50 MG TABS 1 tablet Orally Once a day     No current facility-administered medications on file prior to visit.        ROS:  All others reviewed and negative.  Objective        PE:  BP 136/78 (BP Location: Left Arm, Patient Position: Sitting, Cuff Size: Normal)   Pulse 70   Temp 98.4 F (36.9 C) (Oral)   Ht 5' 5 (1.651 m)   Wt 216 lb (98 kg)   SpO2 98%   BMI 35.94 kg/m                 Constitutional: Pt appears in NAD               HENT: Head: NCAT.                Right Ear: External ear normal.                 Left Ear: External ear normal. Bilat tm's with mild erythema.  Max sinus areas non tender.  Pharynx with mild erythema, no exudate               Eyes: . Pupils are equal, round, and reactive to light. Conjunctivae and EOM are normal               Nose: without d/c or  deformity               Neck: Neck supple. Gross normal ROM               Cardiovascular: Normal rate and regular rhythm.                 Pulmonary/Chest: Effort normal and breath sounds without rales or wheezing.                Abd:  Soft, NT, ND, + BS, no organomegaly               Neurological: Pt is alert. At baseline orientation, motor grossly intact               Skin: Skin is warm. No rashes, no other new lesions, LE edema - none               Psychiatric: Pt behavior is normal without agitation   Micro: none  Cardiac tracings I have personally interpreted today:  none  Pertinent Radiological findings (summarize): none   Lab Results  Component Value Date   WBC 4.6 03/15/2024   HGB 11.5 (L) 03/15/2024   HCT 35.1 (L) 03/15/2024   PLT 147.0 (L) 03/15/2024   GLUCOSE 87 03/15/2024   CHOL 151 03/15/2024   TRIG 91.0 03/15/2024   HDL 59.90 03/15/2024   LDLDIRECT 107.0 09/17/2016   LDLCALC 73 03/15/2024   ALT 8 03/15/2024   AST 15 03/15/2024   NA 143 03/15/2024   K 3.5  03/15/2024   CL 107 03/15/2024   CREATININE 0.89 03/15/2024   BUN 19 03/15/2024   CO2 28 03/15/2024   TSH 3.58 03/15/2024   INR 1.4 (H) 01/13/2024   HGBA1C 5.0 03/15/2024   MICROALBUR 1.5 09/09/2023   Assessment/Plan:  [name] is a 88 y.o. 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  Encounter for well adult exam with abnormal findings Age and sex appropriate education and counseling updated with regular exercise and diet Referrals for preventative services - plans to call to reschedule mammogram soon Immunizations addressed - for flu shot today Smoking counseling  - none needed Evidence for depression or other mood disorder - none significant Most recent labs reviewed. I have personally reviewed and have noted: 1) the patient's medical and social history 2) The patient's current medications and supplements 3) The patient's height, weight, and BMI have been recorded in the chart   Vitamin D  deficiency Last vitamin D  Lab Results  Component Value Date   VD25OH 50.75 03/15/2024   Stable, cont oral replacement   Prediabetes Lab Results  Component Value Date   HGBA1C 5.0 03/15/2024   Stable, pt to continue current medical treatment  - diet, wt control   Mixed hyperlipidemia Lab Results  Component Value Date   LDLCALC 73 03/15/2024   Stable, pt to continue repatha  140 mg biweekly, and zetia  10 qd   Essential  hypertension BP Readings from Last 3 Encounters:  03/15/24 136/78  01/16/24 105/64  12/25/23 118/79   Stable, pt to continue medical treatment hct 50 every  day, toprol  xl 12.5 qd   Allergic rhinitis Mild to mod, for restart azelastine  nasal asd,  to f/u any worsening symptoms or concerns  Anemia Also for f/u iron lab , cbc, but likely improving  Late effect of fracture of neck of femur With persistent mod pain on ambulation but functions well, tylenol  not working, ok for tramadol  50 mg  bid prn limited rx  Followup: Return in about 6 months (around 09/12/2024).  Lynwood Rush, MD 03/16/2024 5:44 AM [facility]  Primary Care - [facility]

## 2024-03-15 NOTE — Patient Instructions (Signed)
 You had the flu shot today  Please continue all other medications as before, including the tylenol  and tramadol  as needed for pain, and the azelasting as you mentioned  Make sure to call about your mammogram as you mentioned  Please have the pharmacy call with any other refills you may need.  Please continue your efforts at being more active, low cholesterol diet, and weight control.  You are otherwise up to date with prevention measures today.  Please keep your appointments with your specialists as you may have planned  Please go to the LAB at the blood drawing area for the tests to be done  You will be contacted by phone if any changes need to be made immediately.  Otherwise, you will receive a letter about your results with an explanation, but please check with MyChart first.  Please make an Appointment to return in 6 months, or sooner if needed

## 2024-03-16 ENCOUNTER — Encounter: Payer: Self-pay | Admitting: Internal Medicine

## 2024-03-16 DIAGNOSIS — D649 Anemia, unspecified: Secondary | ICD-10-CM | POA: Insufficient documentation

## 2024-03-16 NOTE — Assessment & Plan Note (Signed)
 BP Readings from Last 3 Encounters:  03/15/24 136/78  01/16/24 105/64  12/25/23 118/79   Stable, pt to continue medical treatment hct 50 every day, toprol  xl 12.5 qd

## 2024-03-16 NOTE — Assessment & Plan Note (Signed)
 Also for f/u iron lab , cbc, but likely improving

## 2024-03-16 NOTE — Assessment & Plan Note (Signed)
 Lab Results  Component Value Date   LDLCALC 73 03/15/2024   Stable, pt to continue repatha  140 mg biweekly, and zetia  10 qd

## 2024-03-16 NOTE — Assessment & Plan Note (Signed)
 Mild to mod, for restart azelastine  nasal asd,  to f/u any worsening symptoms or concerns

## 2024-03-16 NOTE — Assessment & Plan Note (Signed)
 Age and sex appropriate education and counseling updated with regular exercise and diet Referrals for preventative services - plans to call to reschedule mammogram soon Immunizations addressed - for flu shot today Smoking counseling  - none needed Evidence for depression or other mood disorder - none significant Most recent labs reviewed. I have personally reviewed and have noted: 1) the patient's medical and social history 2) The patient's current medications and supplements 3) The patient's height, weight, and BMI have been recorded in the chart

## 2024-03-16 NOTE — Assessment & Plan Note (Signed)
 Lab Results  Component Value Date   HGBA1C 5.0 03/15/2024   Stable, pt to continue current medical treatment  - diet, wt control

## 2024-03-16 NOTE — Assessment & Plan Note (Signed)
 Last vitamin D  Lab Results  Component Value Date   VD25OH 50.75 03/15/2024   Stable, cont oral replacement

## 2024-03-16 NOTE — Assessment & Plan Note (Signed)
 With persistent mod pain on ambulation but functions well, tylenol  not working, ok for tramadol  50 mg  bid prn limited rx

## 2024-03-17 ENCOUNTER — Other Ambulatory Visit: Payer: Self-pay | Admitting: Internal Medicine

## 2024-03-17 MED ORDER — ALPRAZOLAM 0.5 MG PO TABS: ORAL_TABLET | ORAL | 2 refills | Status: AC

## 2024-03-19 ENCOUNTER — Telehealth: Payer: Self-pay

## 2024-03-19 NOTE — Telephone Encounter (Signed)
 Ok for [employer]

## 2024-03-19 NOTE — Telephone Encounter (Signed)
 Copied from CRM #[id number]. Topic: Clinical - Home Health Verbal Orders >> Mar 19, 2024  3:28 PM Nessti S wrote: Caller/AgencyBETHA signe Rushing Number: [phone] Service Requested: Occupational Therapy Frequency: 1x a week for 4 weeks Any new concerns about the patient? No

## 2024-03-22 NOTE — Telephone Encounter (Signed)
 Called and gave verbals.

## 2024-04-09 ENCOUNTER — Other Ambulatory Visit: Payer: Self-pay | Admitting: Gastroenterology

## 2024-04-09 ENCOUNTER — Other Ambulatory Visit: Payer: Self-pay | Admitting: Nurse Practitioner

## 2024-04-16 ENCOUNTER — Telehealth: Payer: Self-pay

## 2024-04-16 NOTE — Telephone Encounter (Signed)
 Called spoke with Gracie and gave her verbal orders She confirmed

## 2024-04-16 NOTE — Telephone Encounter (Signed)
 Copied from CRM #[id number]. Topic: Clinical - Home Health Verbal Orders >> Apr 16, 2024  9:51 AM Rea BROCKS wrote: Caller/Agency: [facility]  Callback Number: [phone] Service Requested: Physical Therapy  Frequency: 1x week for 9 weeks  Any new concerns about the patient? No

## 2024-04-19 ENCOUNTER — Encounter (HOSPITAL_BASED_OUTPATIENT_CLINIC_OR_DEPARTMENT_OTHER): Payer: Self-pay

## 2024-06-21 ENCOUNTER — Ambulatory Visit: Admitting: Internal Medicine

## 2024-09-14 ENCOUNTER — Ambulatory Visit: Admitting: Family Medicine

## 2024-11-10 ENCOUNTER — Ambulatory Visit
